# Patient Record
Sex: Male | Born: 1945 | Race: White | Hispanic: No | Marital: Single | Smoking: Former smoker
Health system: Southern US, Community
[De-identification: ages and names within clinical notes are randomized; demographics above are authoritative.]

## PROBLEM LIST (undated history)

## (undated) DIAGNOSIS — Z951 Presence of aortocoronary bypass graft: Secondary | ICD-10-CM

## (undated) DIAGNOSIS — Z9889 Other specified postprocedural states: Secondary | ICD-10-CM

## (undated) DIAGNOSIS — C679 Malignant neoplasm of bladder, unspecified: Secondary | ICD-10-CM

## (undated) DIAGNOSIS — G8929 Other chronic pain: Secondary | ICD-10-CM

## (undated) DIAGNOSIS — S3992XA Unspecified injury of lower back, initial encounter: Secondary | ICD-10-CM

## (undated) DIAGNOSIS — I739 Peripheral vascular disease, unspecified: Secondary | ICD-10-CM

## (undated) DIAGNOSIS — E785 Hyperlipidemia, unspecified: Secondary | ICD-10-CM

## (undated) DIAGNOSIS — E119 Type 2 diabetes mellitus without complications: Secondary | ICD-10-CM

## (undated) DIAGNOSIS — I10 Essential (primary) hypertension: Secondary | ICD-10-CM

## (undated) DIAGNOSIS — K219 Gastro-esophageal reflux disease without esophagitis: Secondary | ICD-10-CM

## (undated) DIAGNOSIS — Z888 Allergy status to other drugs, medicaments and biological substances status: Secondary | ICD-10-CM

## (undated) DIAGNOSIS — I480 Paroxysmal atrial fibrillation: Secondary | ICD-10-CM

## (undated) DIAGNOSIS — I2510 Atherosclerotic heart disease of native coronary artery without angina pectoris: Secondary | ICD-10-CM

## (undated) DIAGNOSIS — I255 Ischemic cardiomyopathy: Secondary | ICD-10-CM

## (undated) DIAGNOSIS — G894 Chronic pain syndrome: Secondary | ICD-10-CM

## (undated) DIAGNOSIS — Z87891 Personal history of nicotine dependence: Secondary | ICD-10-CM

## (undated) DIAGNOSIS — I779 Disorder of arteries and arterioles, unspecified: Secondary | ICD-10-CM

## (undated) DIAGNOSIS — C801 Malignant (primary) neoplasm, unspecified: Secondary | ICD-10-CM

## (undated) HISTORY — DX: Personal history of nicotine dependence: Z87.891

## (undated) HISTORY — DX: Essential (primary) hypertension: I10

## (undated) HISTORY — DX: Allergy status to other drugs, medicaments and biological substances: Z88.8

## (undated) HISTORY — DX: Atherosclerotic heart disease of native coronary artery without angina pectoris: I25.10

## (undated) HISTORY — DX: Disorder of arteries and arterioles, unspecified: I77.9

## (undated) HISTORY — DX: Hyperlipidemia, unspecified: E78.5

## (undated) HISTORY — DX: Paroxysmal atrial fibrillation: I48.0

## (undated) HISTORY — DX: Peripheral vascular disease, unspecified: I73.9

## (undated) HISTORY — DX: Gastro-esophageal reflux disease without esophagitis: K21.9

## (undated) HISTORY — DX: Type 2 diabetes mellitus without complications: E11.9

## (undated) HISTORY — PX: CARDIAC CATHETERIZATION: SHX172

## (undated) HISTORY — DX: Chronic pain syndrome: G89.4

---

## 2004-10-21 ENCOUNTER — Other Ambulatory Visit: Payer: Self-pay

## 2004-10-21 ENCOUNTER — Inpatient Hospital Stay: Payer: Self-pay | Admitting: Internal Medicine

## 2004-10-21 IMAGING — CR DG CHEST 1V PORT
1 series · 1 of 1 positions shown · non-contrast
Comparison: none

REASON FOR EXAM: Chest pain
COMMENTS:

[view not recorded]
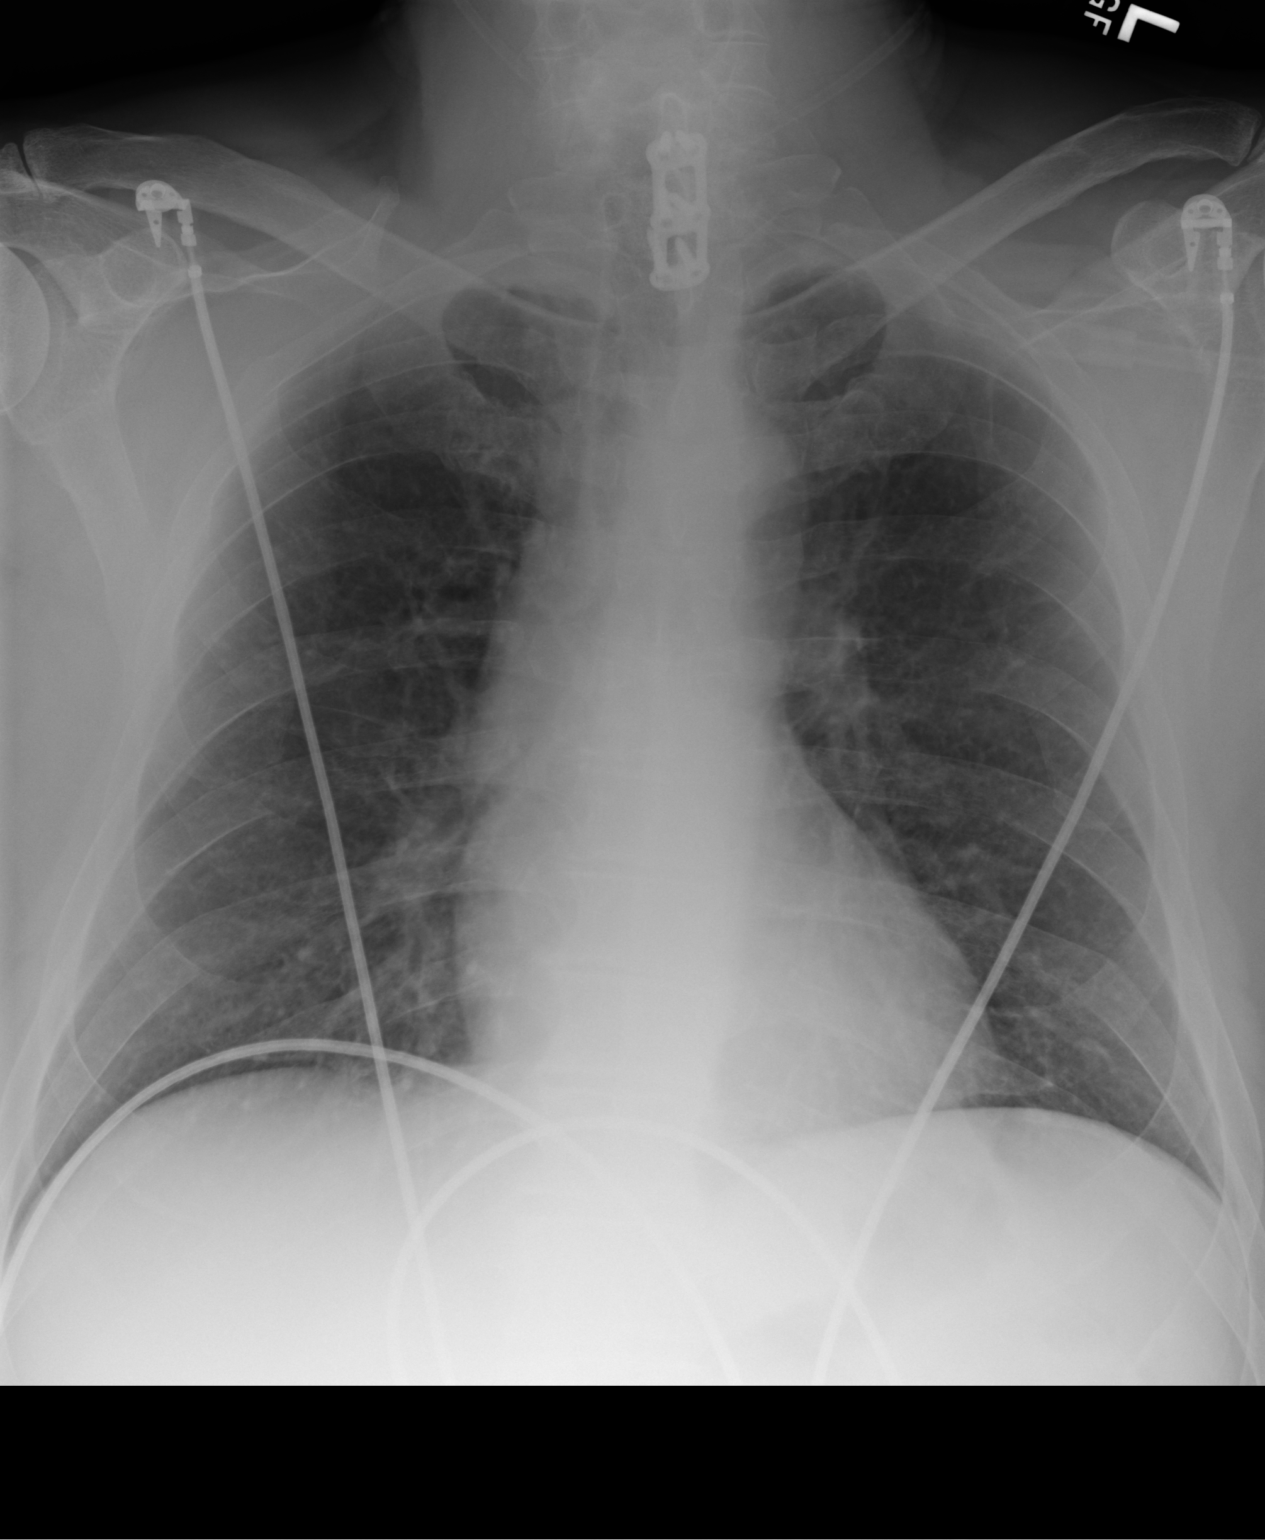

[1 of 1 positions shown; findings below may reference images not displayed]

PROCEDURE:     DXR - DXR PORTABLE CHEST SINGLE VIEW  - [DATE]  [DATE]

RESULT:     AP view of the chest is compared to a prior exam of [DATE].

The lung fields are clear. No pneumonia, pneumothorax or pleural effusion is
seen. The heart size is normal. The mediastinum and osseous structures show
no acute changes.
IMPRESSION: 1.     No acute changes are identified.
2.     Monitoring electrodes are present.
3.     Post-operative changes of prior cervical fusion are noted.

## 2004-10-22 ENCOUNTER — Other Ambulatory Visit: Payer: Self-pay

## 2004-10-23 ENCOUNTER — Other Ambulatory Visit: Payer: Self-pay

## 2004-10-23 IMAGING — CT CT ABD-PELV W/ CM
1 of 2 series · 14 of 32 positions shown, 18 images · non-contrast
Comparison: none

REASON FOR EXAM: Rule out mass
COMMENTS:

[Series 2: abdomen · axial · 0.67mm/px · z∈[-630,-206]mm · 14 of 61 slices shown, 18 images]
[im 5/61  soft-tissue]
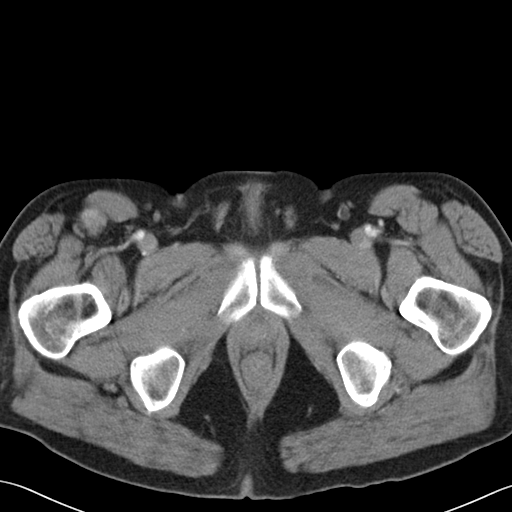
[im 5/61  bone]
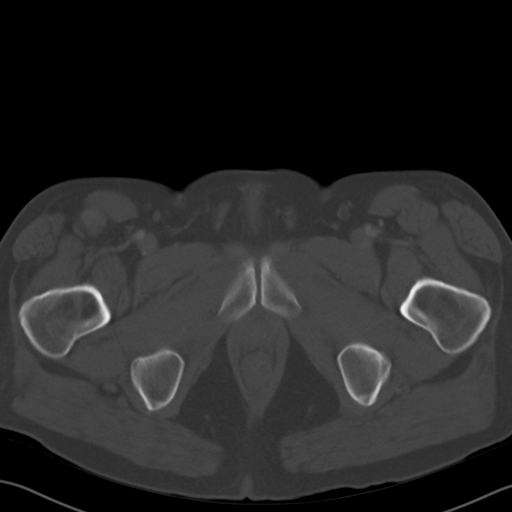
[im 10/61  soft-tissue]
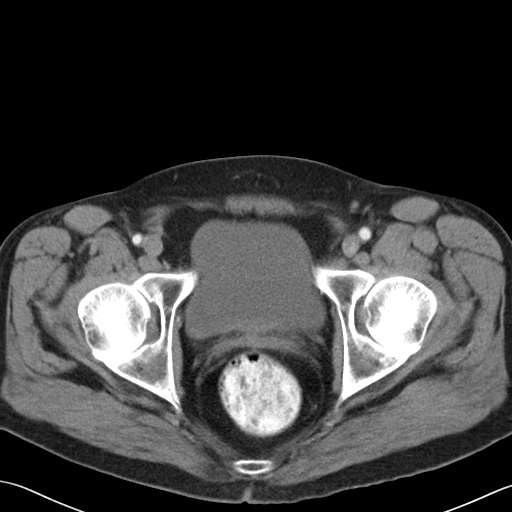
[im 14/61  soft-tissue]
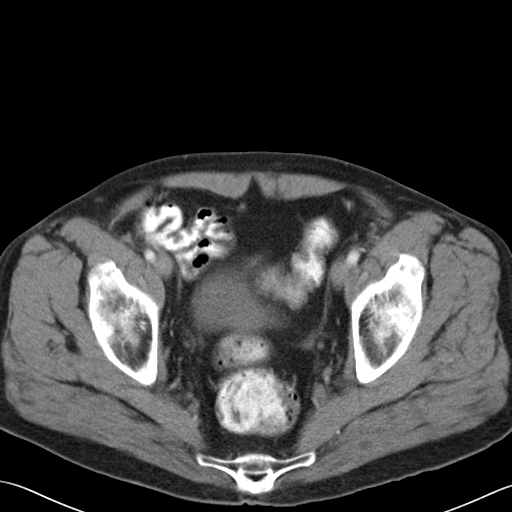
[im 19/61  soft-tissue]
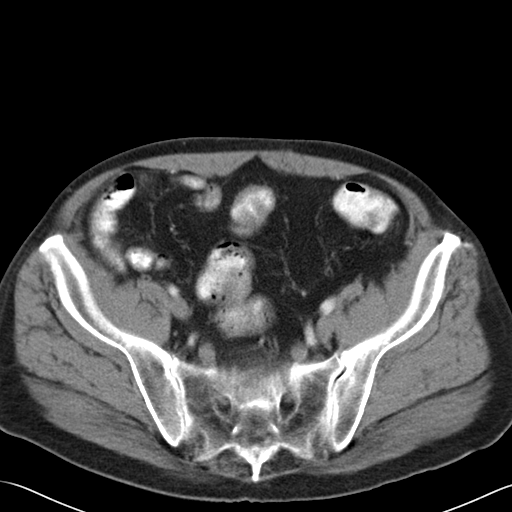
[im 24/61  soft-tissue]
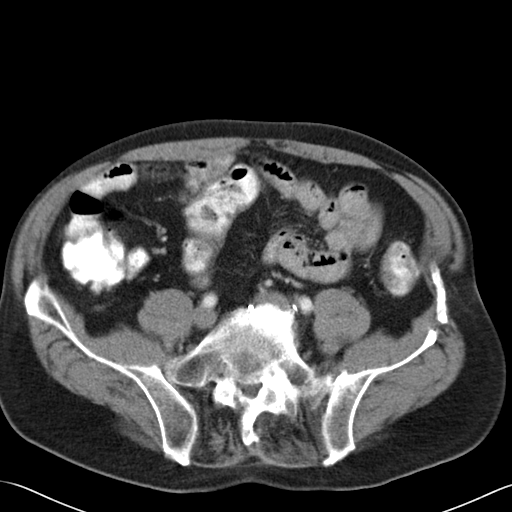
[im 28/61  soft-tissue]
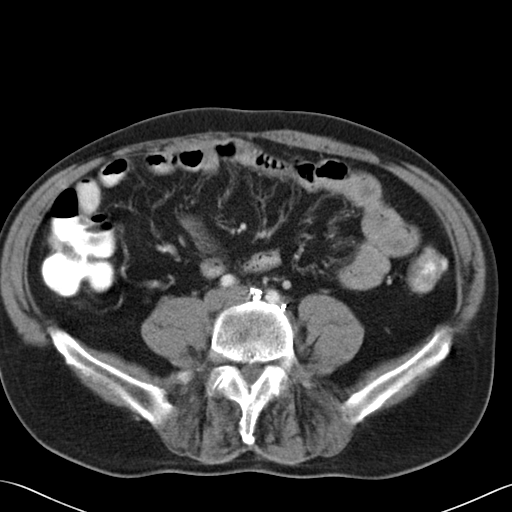
[im 33/61  soft-tissue]
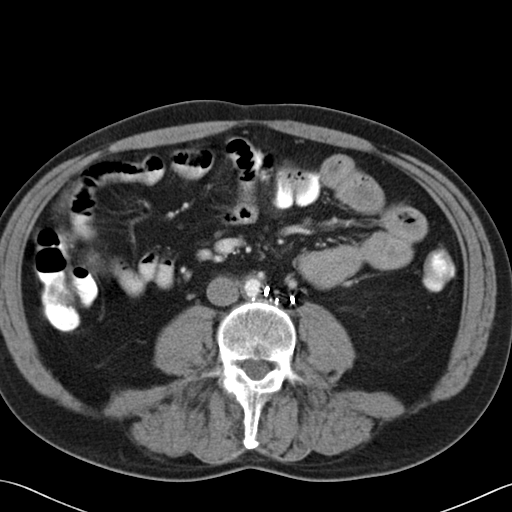
[im 37/61  soft-tissue]
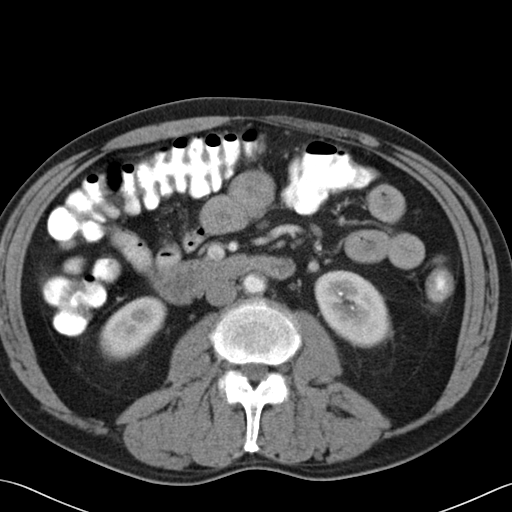
[im 42/61  soft-tissue]
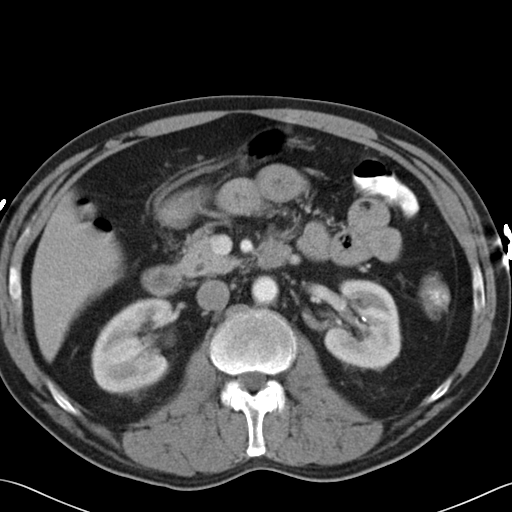
[im 42/61  bone]
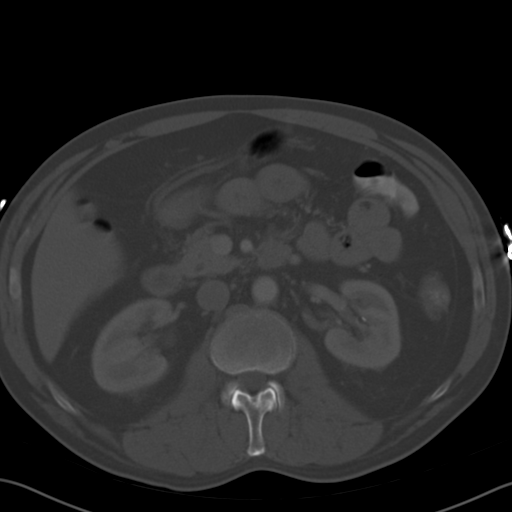
[im 47/61  soft-tissue]
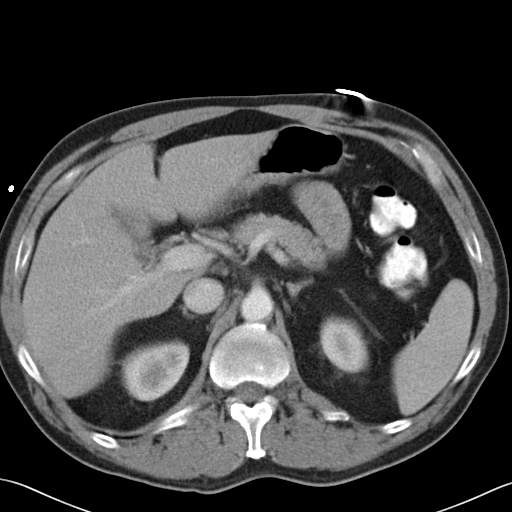
[im 51/61  soft-tissue]
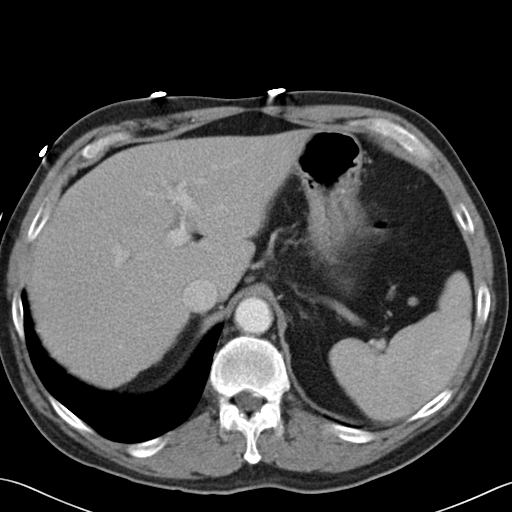
[im 51/61  lung]
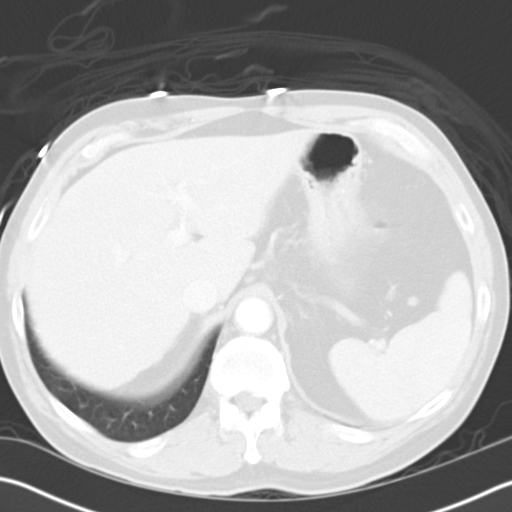
[im 54/61  lung]
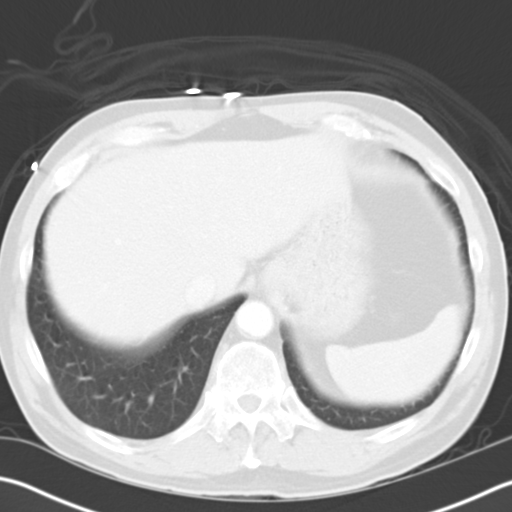
[im 56/61  soft-tissue]
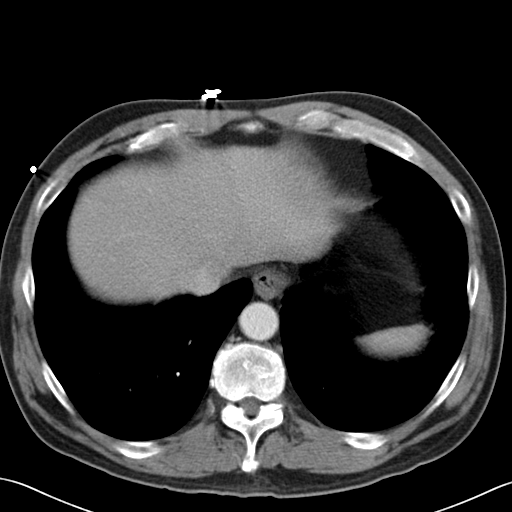
[im 56/61  lung]
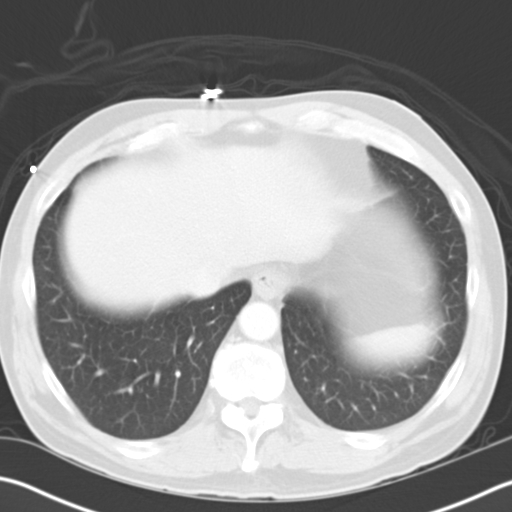
[im 58/61  lung]
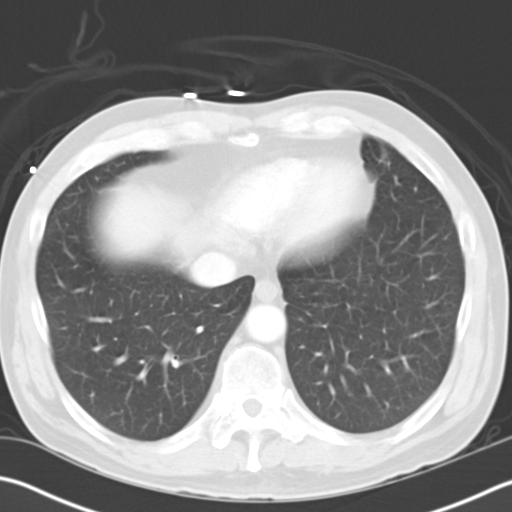

[14 of 32 positions shown; findings below may reference images not displayed]

PROCEDURE:     CT  - CT ABDOMEN / PELVIS  W  - [DATE]  [DATE]

RESULT:     The patient has abdominal discomfort.  The patient underwent
cardiac catheterization on [DATE].

The patient received 100 cc of [3M].  The liver exhibits normal
density with no mass or ductal dilation.  The gallbladder, pancreas, spleen,
and nondistended stomach are normal in appearance.  The loops of small bowel
exhibit no acute abnormality.  Only a small amount of contrast is present
within them.  Loops of large bowel are partially contrast filled and are
grossly normal in appearance.  There is a structure seen consistent with a
normal appendix.  I do not see evidence of an inguinal or pelvic or
abdominal hemorrhage.  The retroperitoneum is normal in appearance.  There
are numerous surgical clips present adjacent to the anterior aspect of the
L3 and 4 vertebral bodies.  The caliber of the abdominal aorta is within the
limits of normal.  The kidneys exhibit no hydronephrosis, but there are
dense calcifications associated with the mid and lower pole calices on the
LEFT and with a lower pole calix on the RIGHT.  This calcification on the
RIGHT is large measuring nearly 8 mm in greatest dimension.  The perinephric
fat is normal in appearance.  The caliber of the abdominal aorta is normal.
The lung windows reveal no abnormalities at the lung bases.
IMPRESSION: I do not see findings to suggest a retroperitoneal or other intraabdominal
hemorrhage.  The soft tissues of the groin exhibit no hematoma.  Of note is
the fact that on image 55 and image 58, there are calcifications that appear
to be associated with muscle groups along the extensor aspect of the upper
thigh.  This may reflect the sequelae of prior trauma.

I do not see evidence of acute hepatobiliary disease or acute renal
abnormality.  There are kidney stones bilaterally.

I do not see findings to suggest inflammatory processes involving the bowel.

There are numerous surgical clips present in the retroperitoneum adjacent to
and to the LEFT of the lower abdominal aorta.  There does appear to be some
degenerative change of the lumbar spine.

## 2005-03-28 ENCOUNTER — Other Ambulatory Visit: Payer: Self-pay

## 2005-03-28 ENCOUNTER — Inpatient Hospital Stay: Payer: Self-pay | Admitting: Internal Medicine

## 2005-03-28 IMAGING — CR DG CHEST 1V PORT
1 series · 1 of 1 positions shown · non-contrast
Comparison: none

REASON FOR EXAM: Chest pain
COMMENTS:

PROCEDURE:     DXR - DXR PORTABLE CHEST SINGLE VIEW  - [DATE]  [DATE]
RESULT:          Comparison is made to a study [DATE].
The lungs are well expanded.  There is no focal infiltrate.  The heart is
not enlarged.  The patient has undergone prior CABG.

[view not recorded]
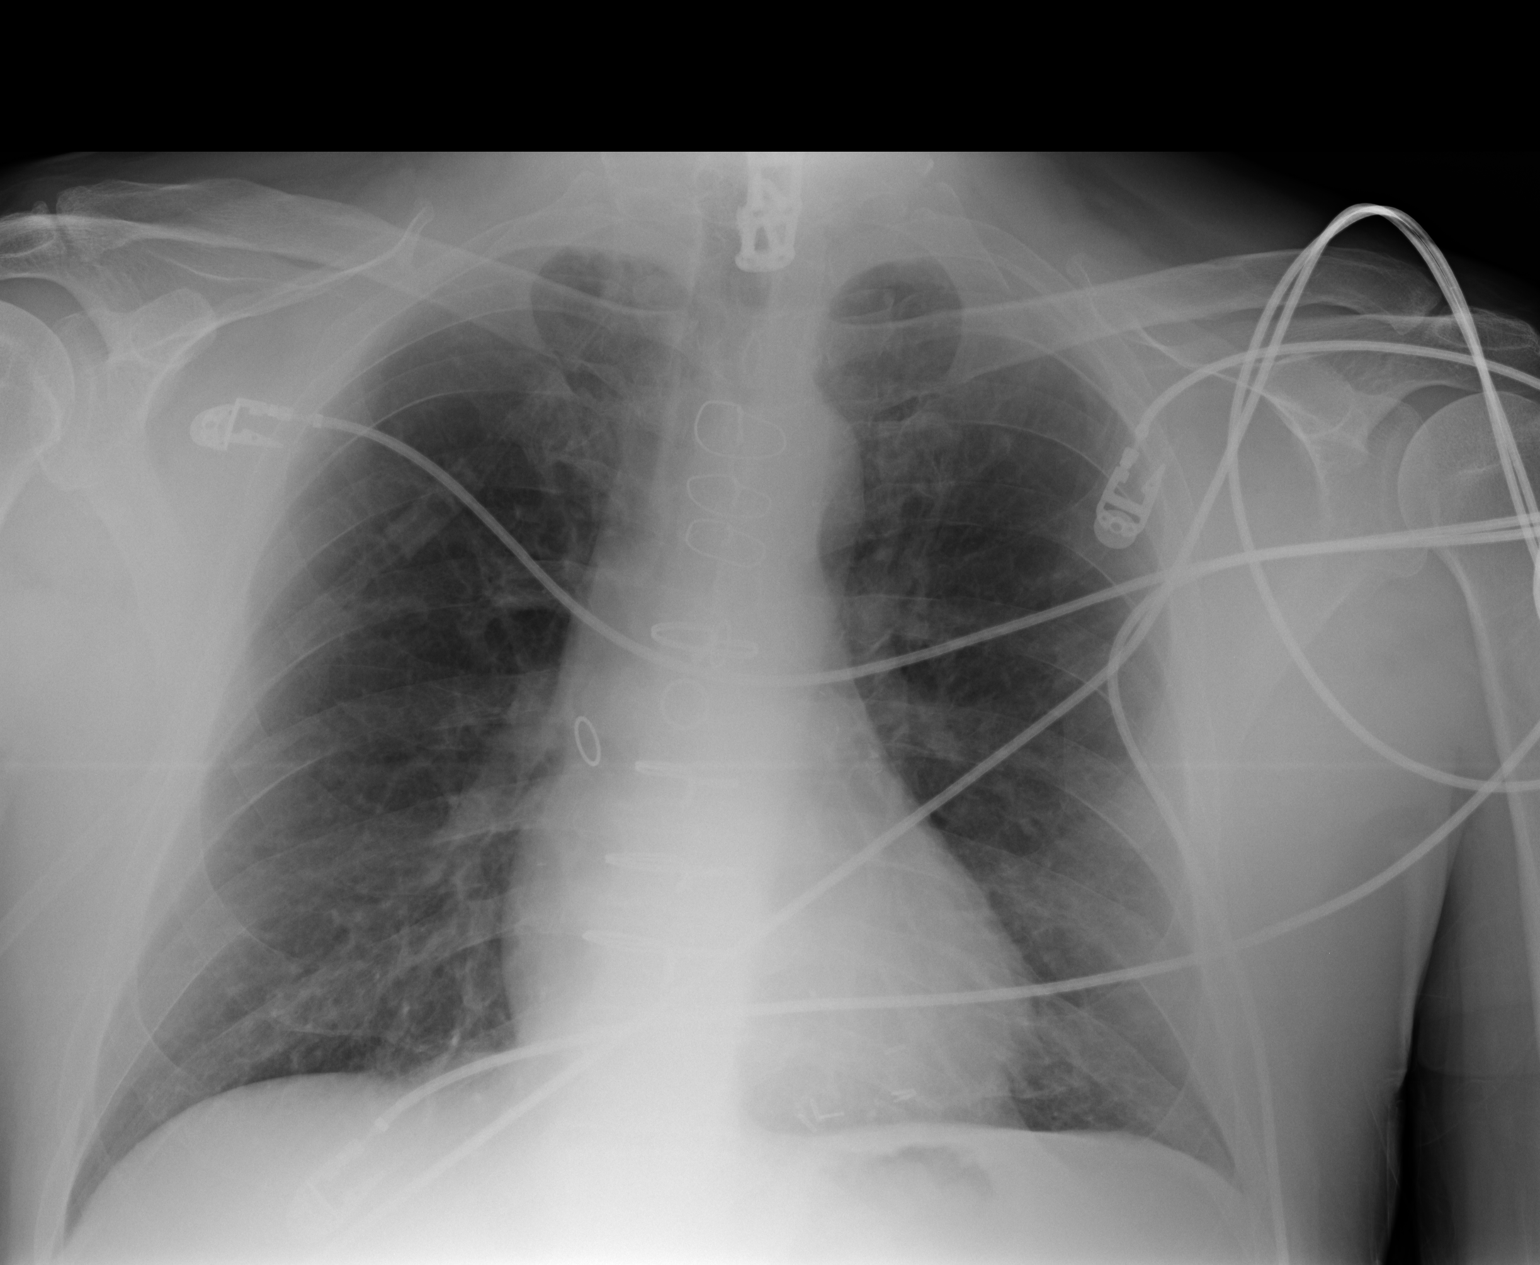

[1 of 1 positions shown; findings below may reference images not displayed]

IMPRESSION: I do not see evidence of pneumonia.  There is very mild
hyperinflation which appears stable but which may reflect an element of
underlying reactive airway disease.  The patient has undergone prior CABG,
but I see no evidence of congestive heart failure.  A prior lower cervical
fusion device is present.

## 2005-04-30 ENCOUNTER — Other Ambulatory Visit: Payer: Self-pay

## 2005-04-30 ENCOUNTER — Emergency Department: Payer: Self-pay | Admitting: Emergency Medicine

## 2005-04-30 IMAGING — CR DG CHEST 1V PORT
1 series · 1 of 1 positions shown · non-contrast
Comparison: none

REASON FOR EXAM: Chest pain
COMMENTS:

[view not recorded]
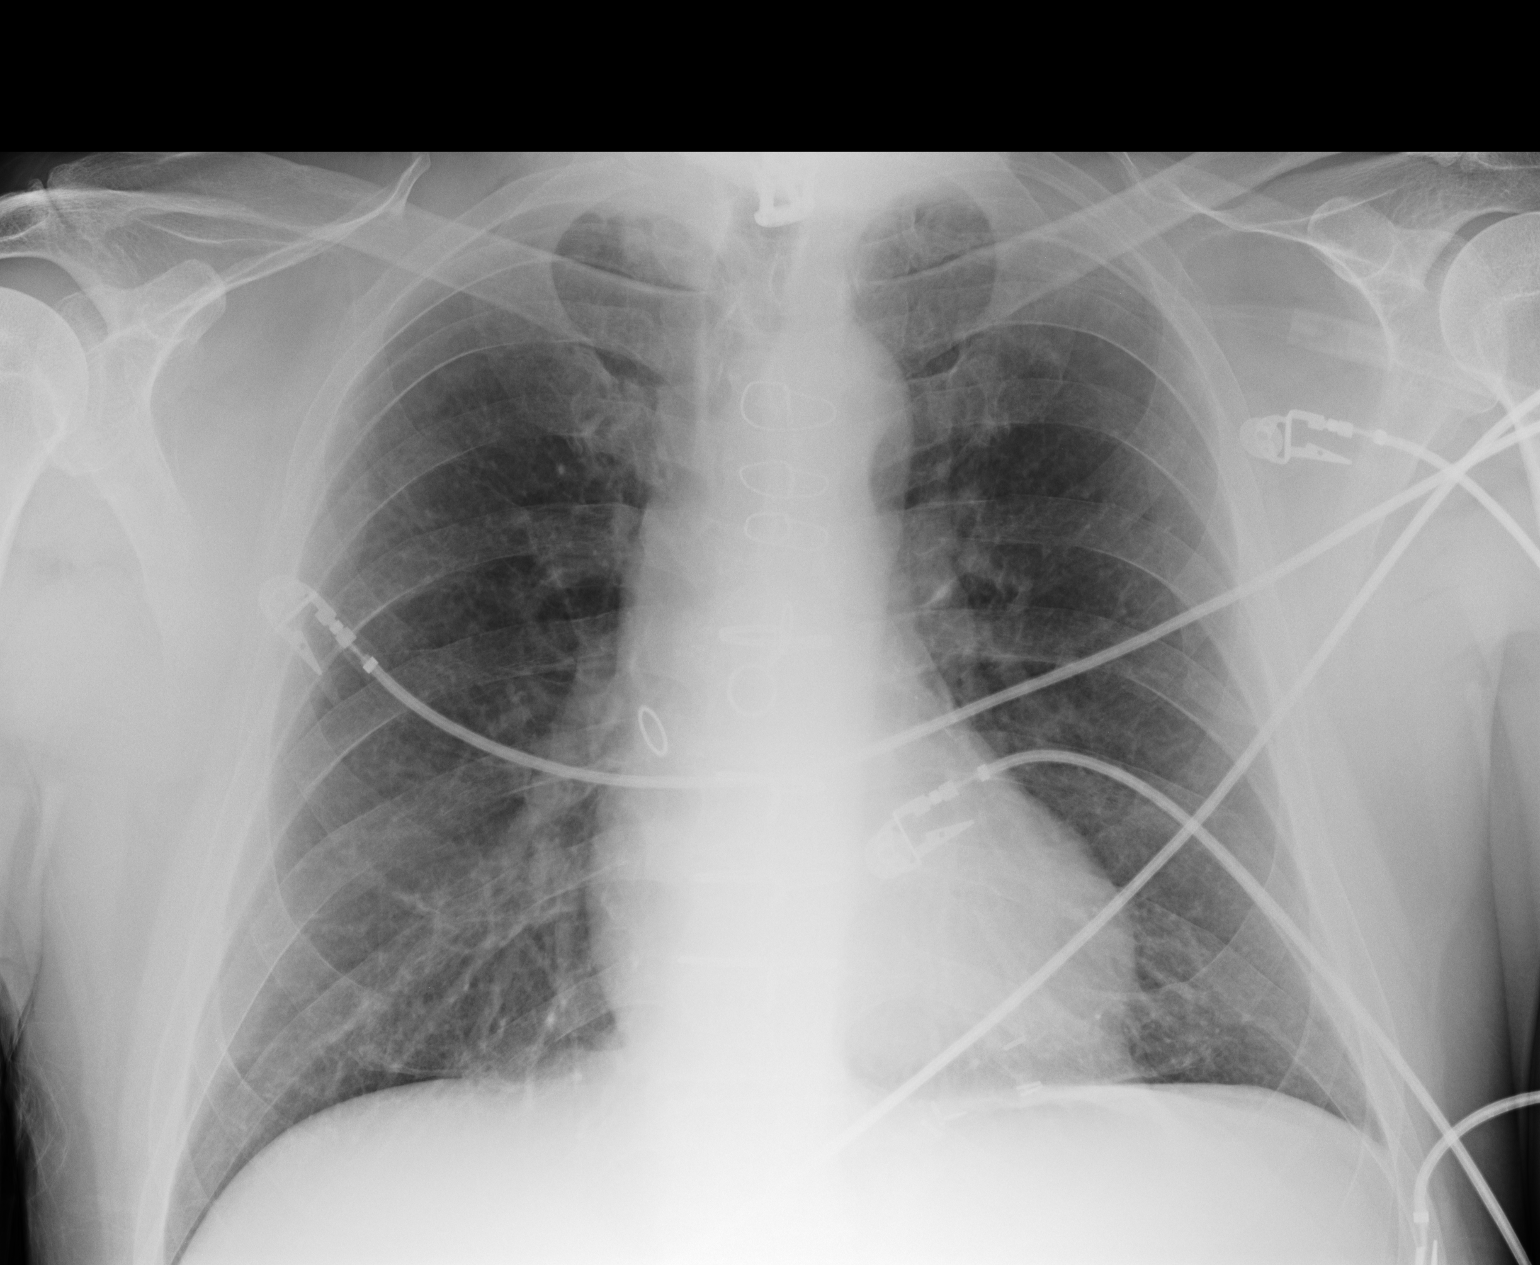

[1 of 1 positions shown; findings below may reference images not displayed]

PROCEDURE:     DXR - DXR PORTABLE CHEST SINGLE VIEW  - [DATE]  [DATE]

RESULT:        AP view of the chest is compared to the prior exam of
[DATE].  The lung fields remain clear.  The heart and pulmonary
vasculature show no significant abnormalities.  Postoperative changes of
prior cardiac surgery are seen.  There is a metallic plate projected over
the lower cervical area compatible with prior cervical surgery. Monitoring
electrodes are present.
IMPRESSION: No acute changes are identified.

## 2005-06-16 HISTORY — PX: CORONARY ARTERY BYPASS GRAFT: SHX141

## 2005-08-17 ENCOUNTER — Other Ambulatory Visit: Payer: Self-pay

## 2005-08-17 ENCOUNTER — Inpatient Hospital Stay: Payer: Self-pay | Admitting: Internal Medicine

## 2005-08-17 IMAGING — CR DG CHEST 1V PORT
1 series · 1 of 1 positions shown · non-contrast
Comparison: none

REASON FOR EXAM: Chest pain         rm 19
COMMENTS:

[view not recorded]
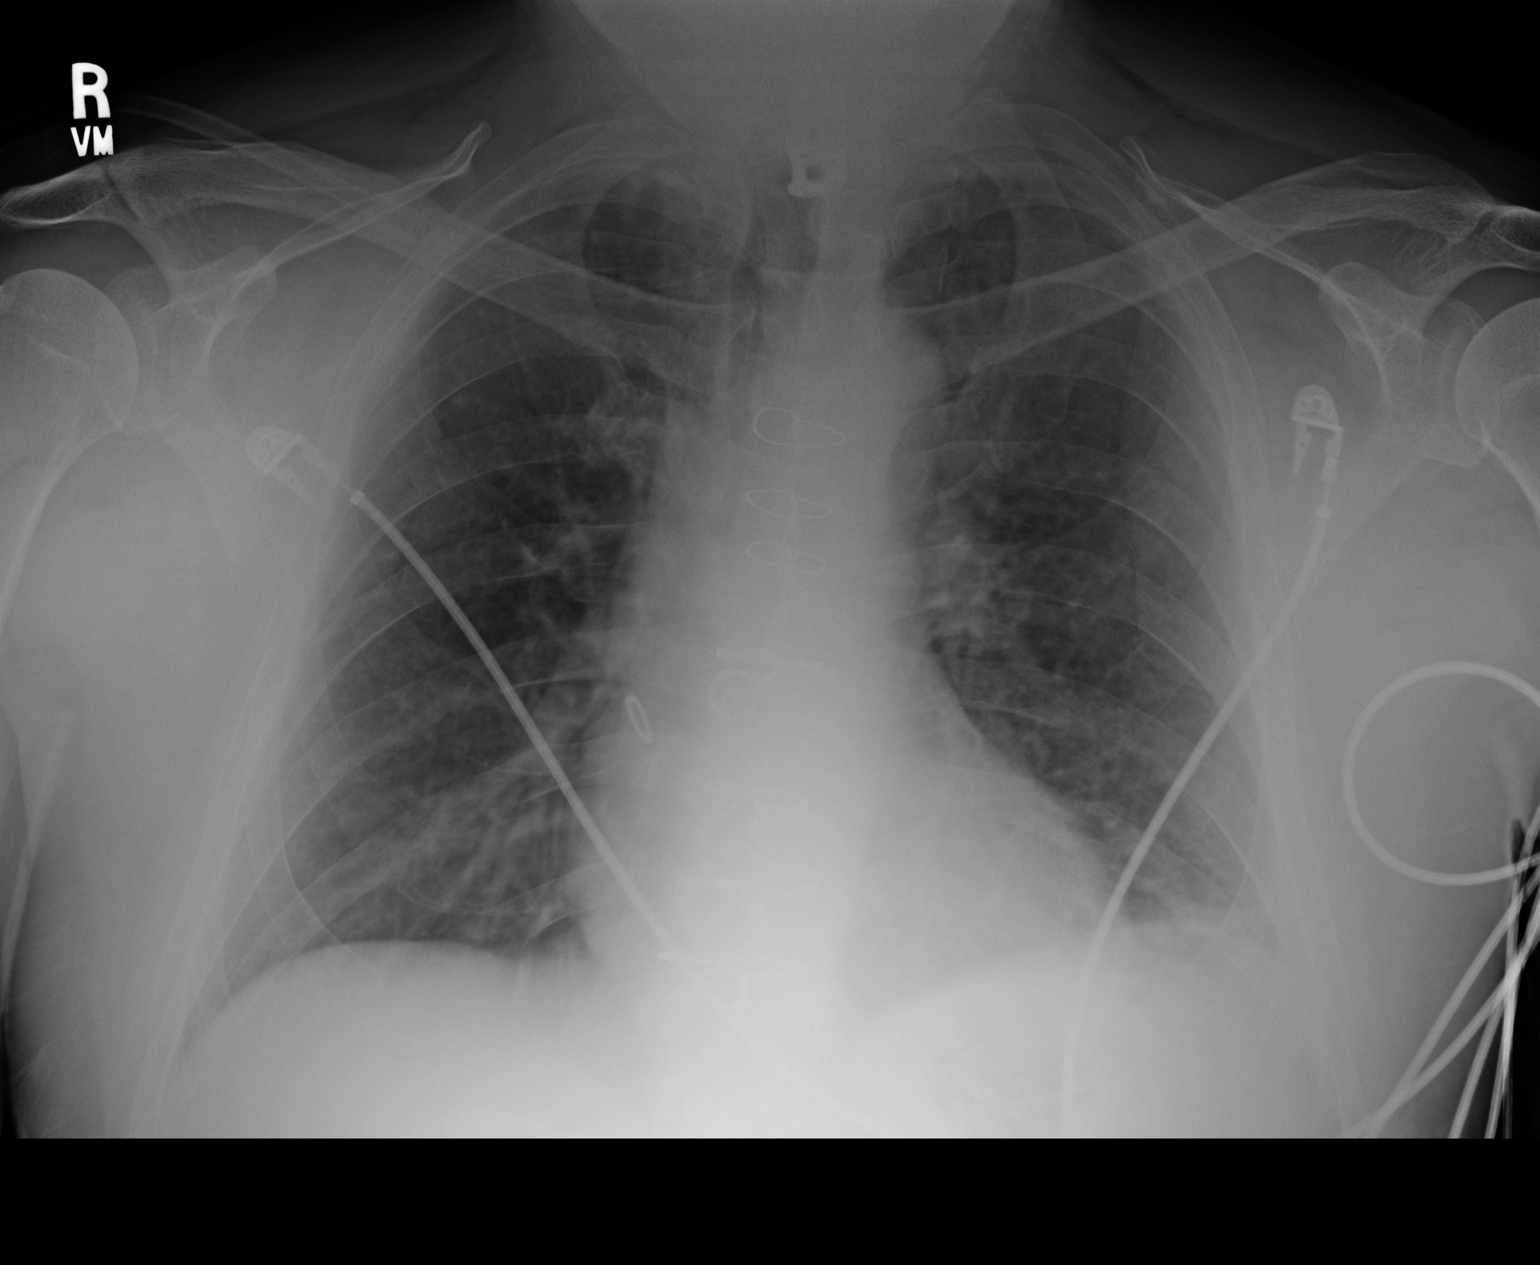

[1 of 1 positions shown; findings below may reference images not displayed]

PROCEDURE:     DXR - DXR PORTABLE CHEST SINGLE VIEW  - [DATE] [DATE]

RESULT:       AP view of the chest is compared to the prior exam of
[DATE].  There is slight atelectasis at the LEFT base.  The lung fields
otherwise are clear.  No pneumonia, pneumothorax or pleural effusion is
seen.   The heart size is normal.  Postoperative changes of prior CABG are
noted.  Monitoring electrodes are present.
IMPRESSION: 1.     There is slight atelectasis at the LEFT lung base.  The lung fields
otherwise are clear.
2.     The heart size is normal.

## 2005-08-18 ENCOUNTER — Other Ambulatory Visit: Payer: Self-pay

## 2007-08-07 ENCOUNTER — Inpatient Hospital Stay: Payer: Self-pay | Admitting: Internal Medicine

## 2007-08-07 ENCOUNTER — Other Ambulatory Visit: Payer: Self-pay

## 2007-08-07 IMAGING — CT CT ABD-PELV W/ CM
1 of 3 series · 14 of 32 positions shown, 19 images · non-contrast
Comparison: Prior CTs from [DATE].

REASON FOR EXAM: (1) Abd pain,N/V,Diarhea; (2) Abd pain,N/V diarhea
COMMENTS:

PROCEDURE:     CT  - CT ABDOMEN / PELVIS  W  - [DATE]  [DATE]
RESULT:
HISTORY: Abdominal pain.

[Series 2: abdomen · axial · 0.79mm/px · z∈[-510,-46]mm · 14 of 66 slices shown, 19 images]
[im 4/66  soft-tissue]
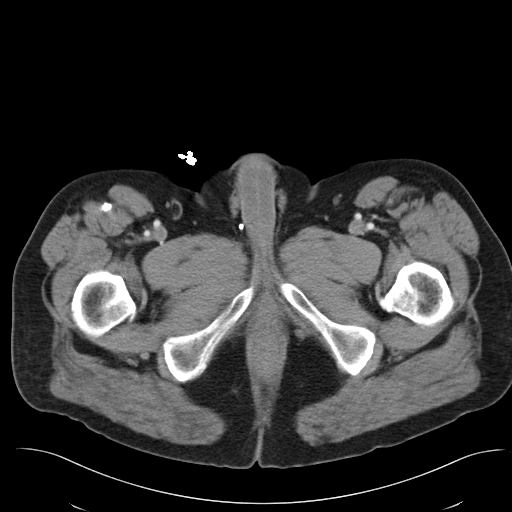
[im 4/66  bone]
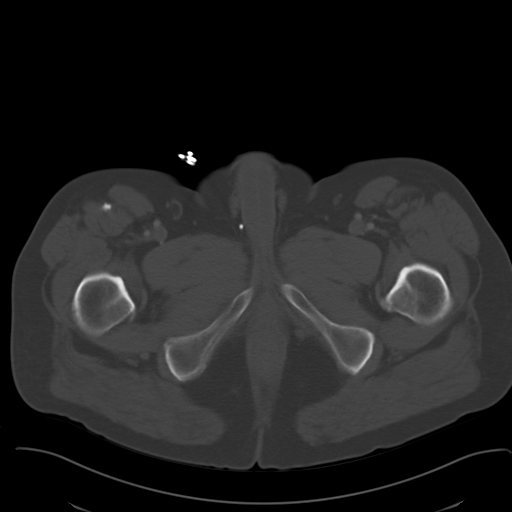
[im 11/66  soft-tissue]
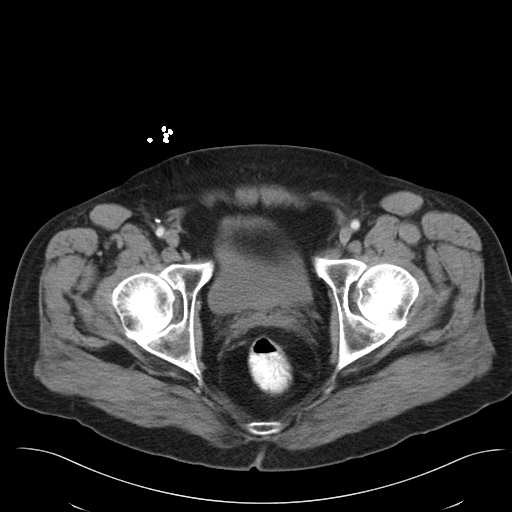
[im 14/66  soft-tissue]
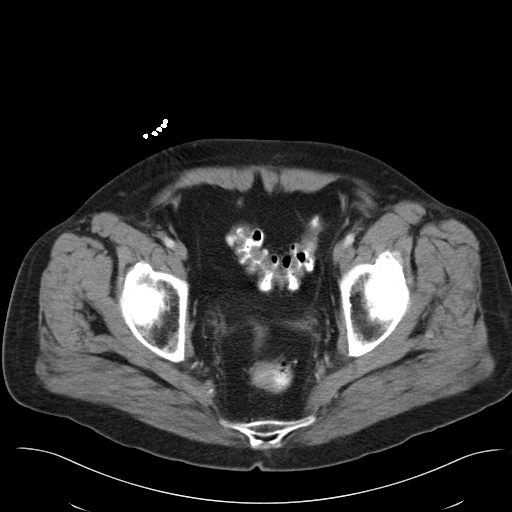
[im 18/66  soft-tissue]
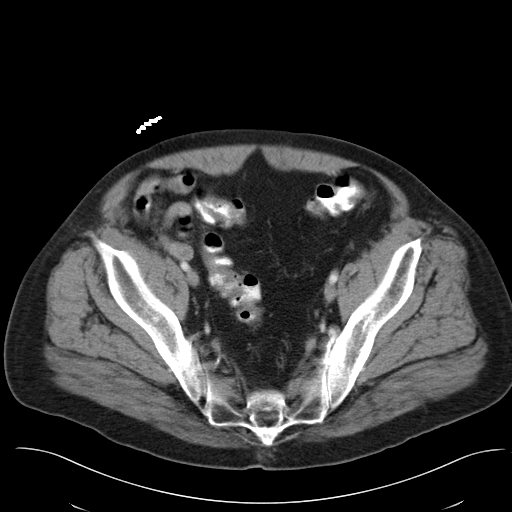
[im 24/66  soft-tissue]
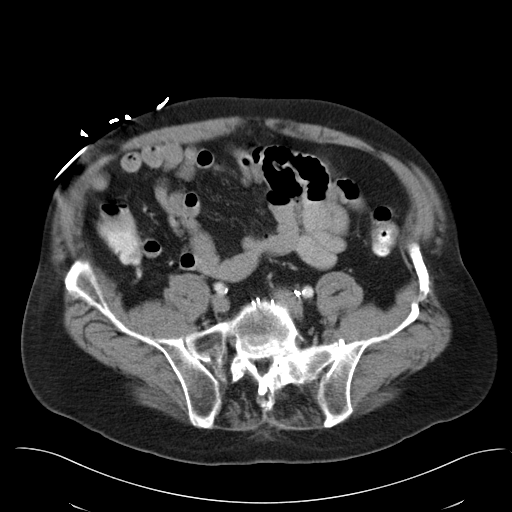
[im 28/66  soft-tissue]
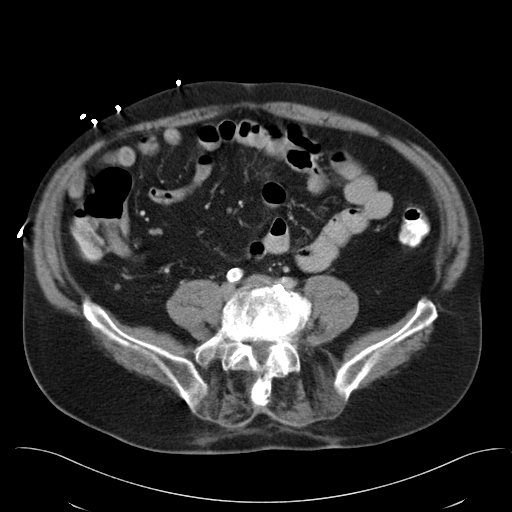
[im 35/66  soft-tissue]
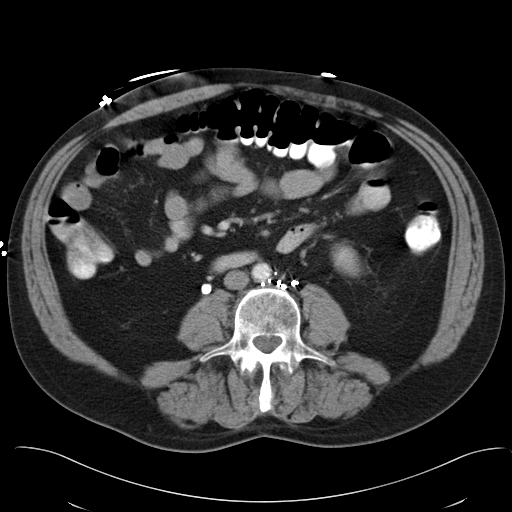
[im 38/66  soft-tissue]
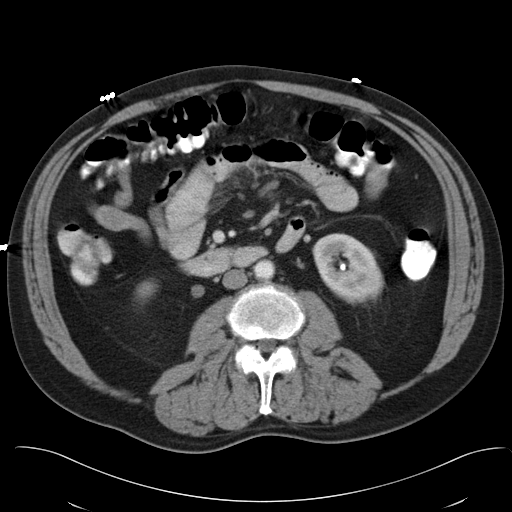
[im 42/66  soft-tissue]
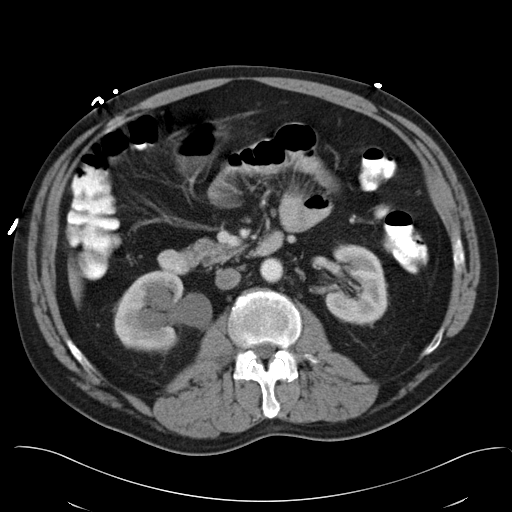
[im 42/66  bone]
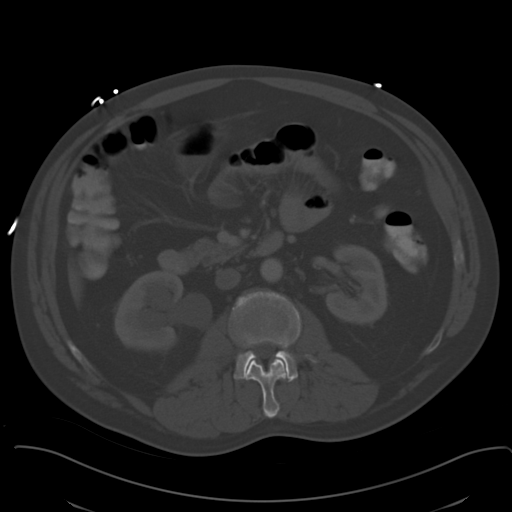
[im 48/66  soft-tissue]
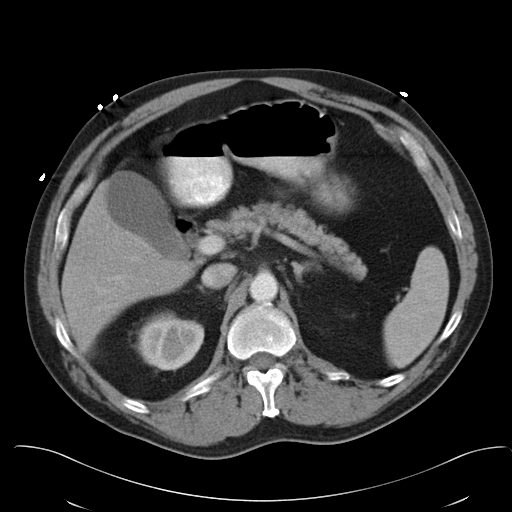
[im 52/66  soft-tissue]
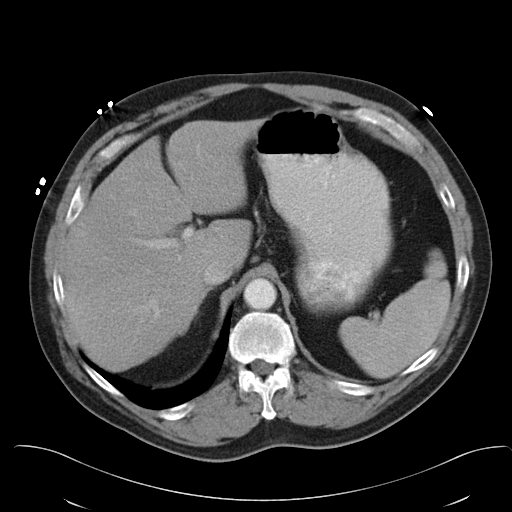
[im 52/66  lung]
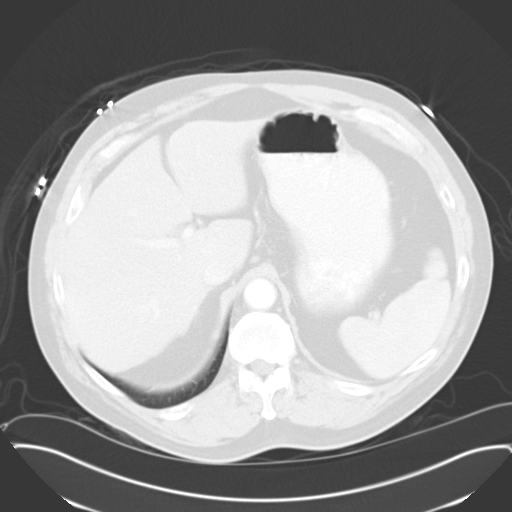
[im 55/66  soft-tissue]
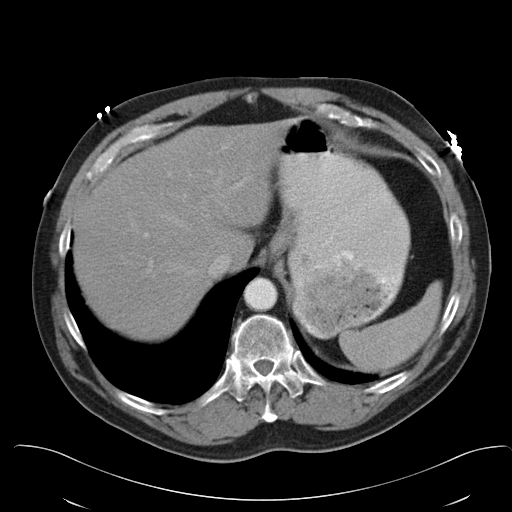
[im 55/66  lung]
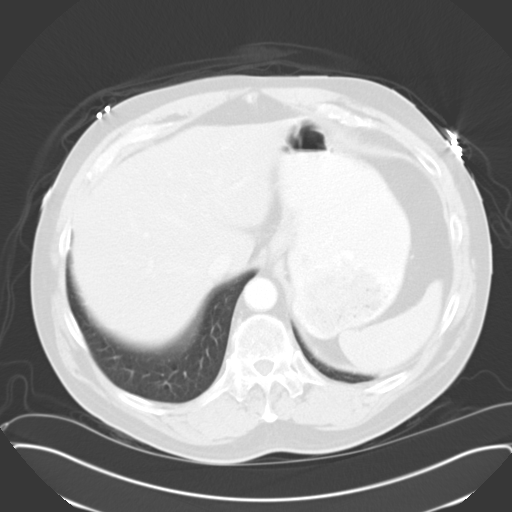
[im 59/66  lung]
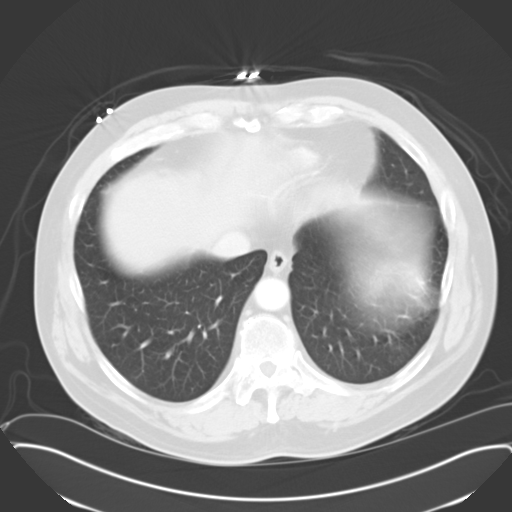
[im 62/66  soft-tissue]
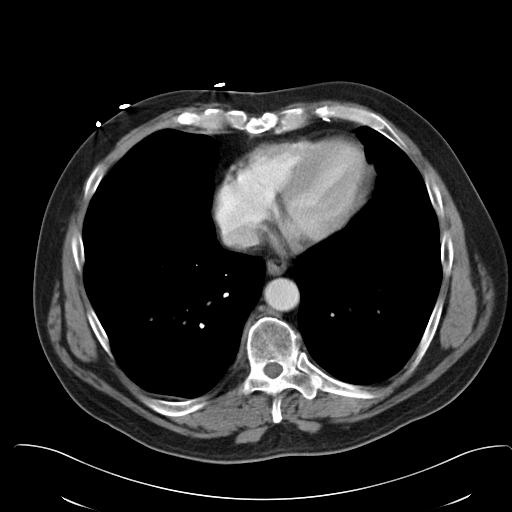
[im 62/66  lung]
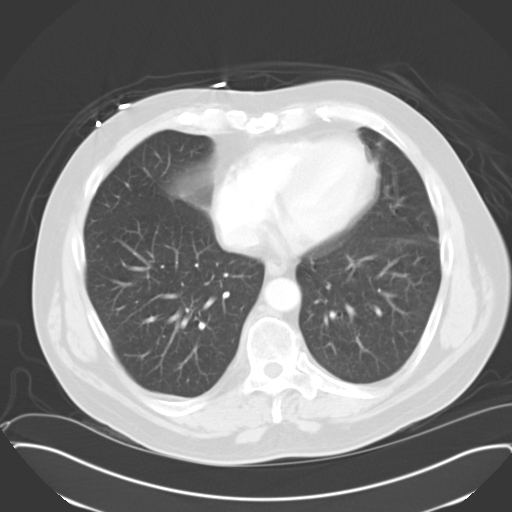

[14 of 32 positions shown; findings below may reference images not displayed]

IV and oral contrast enhanced CT of the abdomen and pelvis obtained.  The
liver and spleen are normal.  Pancreas is normal.  Gallbladder is not
distended. The adrenals are normal. Left nephrolithiasis noted. There is an
approximately 8  millimeter stone the proximal right ureter with right
hydronephrosis. There is no bowel distention. Right upper quadrant is
unremarkable. Pelvis is unremarkable. Lung bases are clear.
IMPRESSION: 1.     8 mm stone in the proximal RIGHT ureter with RIGHT hydronephrosis.
2.     It should be noted that retroperitoneal clips are present.  NO
evidence of retroperitoneal adenopathy or masses are noted.

## 2007-08-07 IMAGING — CR DG CHEST 1V PORT
1 series · 1 of 1 positions shown · non-contrast
Comparison: none

REASON FOR EXAM: Chest Pain
COMMENTS:

[view not recorded]
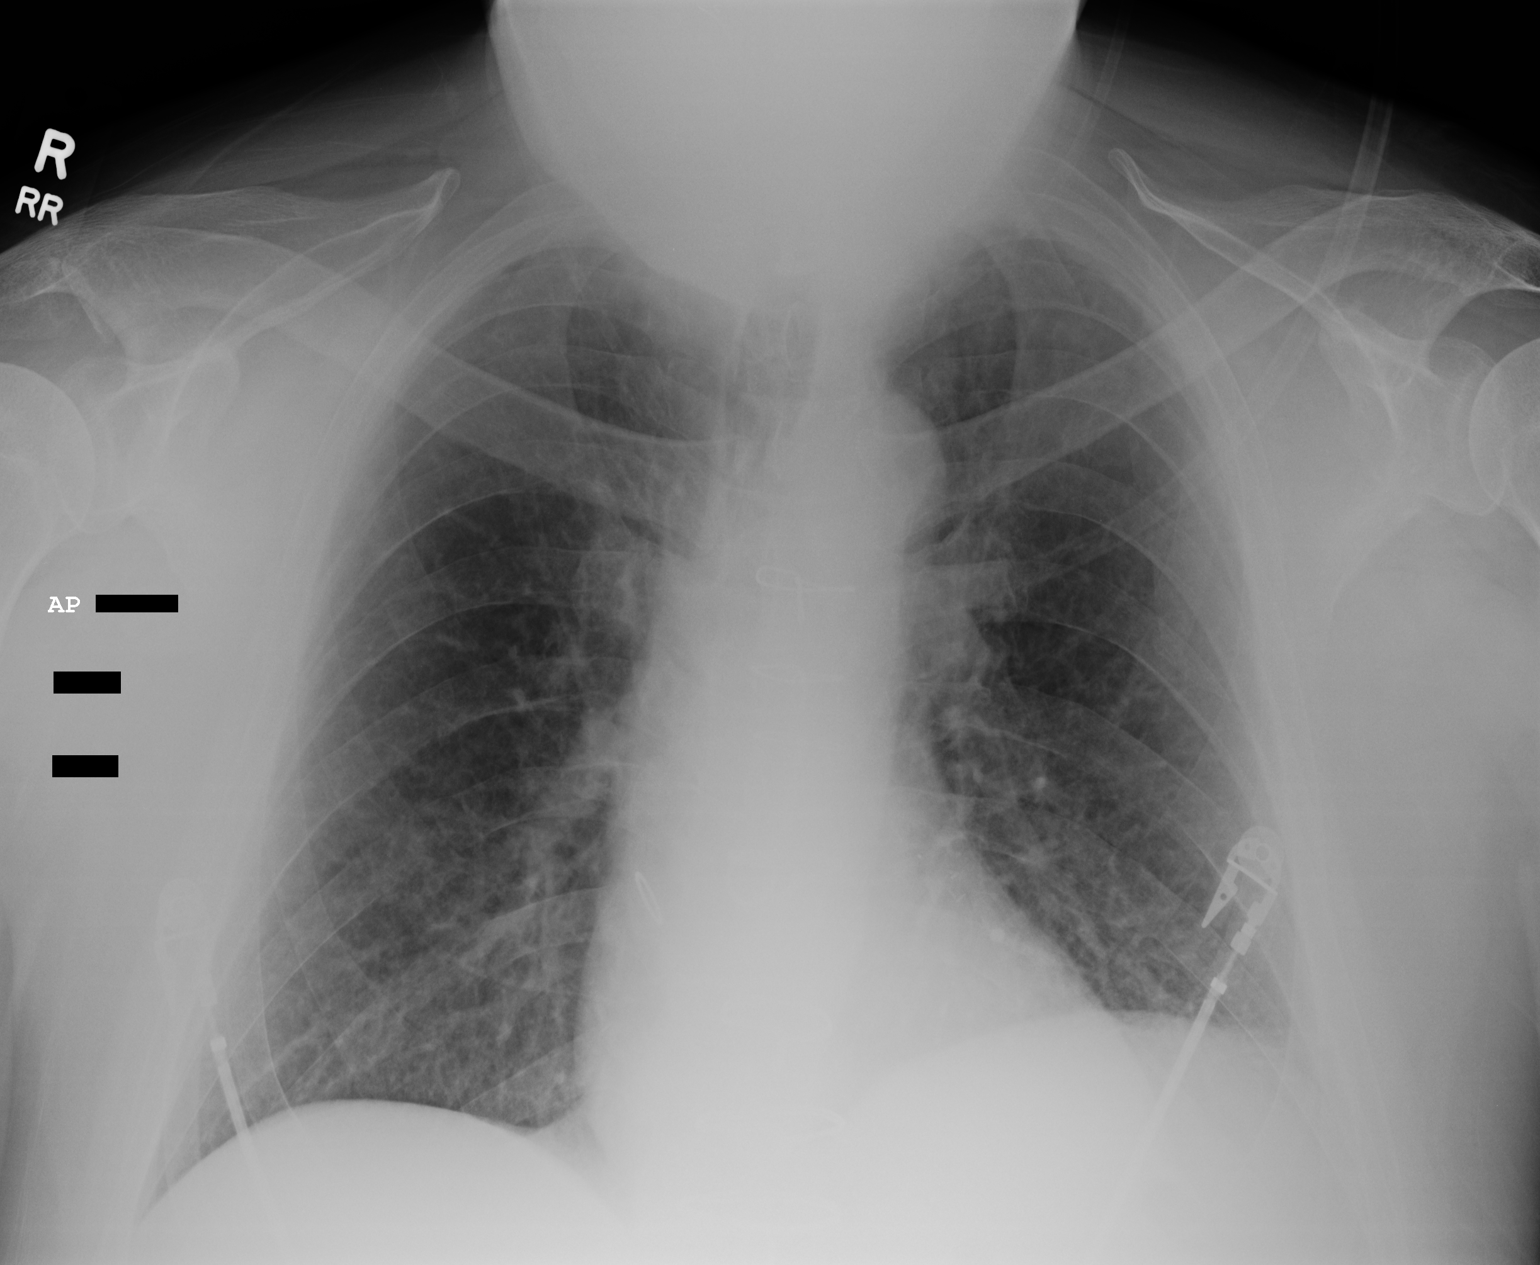

[1 of 1 positions shown; findings below may reference images not displayed]

PROCEDURE:     DXR - DXR PORTABLE CHEST SINGLE VIEW  - [DATE]  [DATE]

RESULT:     No acute cardiopulmonary disease noted.  Cardiovascular
structures are unremarkable.  Chest is stable from [DATE] with mild
elevation of the left hemidiaphragm. Patient has had a prior median
sternotomy. There is no cardiomegaly.
IMPRESSION: 1.     Patient has had a prior median sternotomy.
2.     No acute cardiopulmonary disease.  Chest is stable from [DATE].

## 2008-02-29 ENCOUNTER — Other Ambulatory Visit: Payer: Self-pay

## 2008-02-29 ENCOUNTER — Inpatient Hospital Stay: Payer: Self-pay | Admitting: Specialist

## 2008-02-29 IMAGING — CR PELVIS - 1-2 VIEW
1 series · 1 of 1 positions shown · non-contrast
Comparison: none

REASON FOR EXAM: hip pain s/p fall
COMMENTS:   LMP: (Male)

PROCEDURE:     DXR - DXR PELVIS AP ONLY  - [DATE]  [DATE]
RESULT:     Findings:
Single view of the pelvis demonstrates a comminuted fracture of the left
acetabulum. There is no hip dislocation. There is surgical changes involving
the left ilium.

[view not recorded]
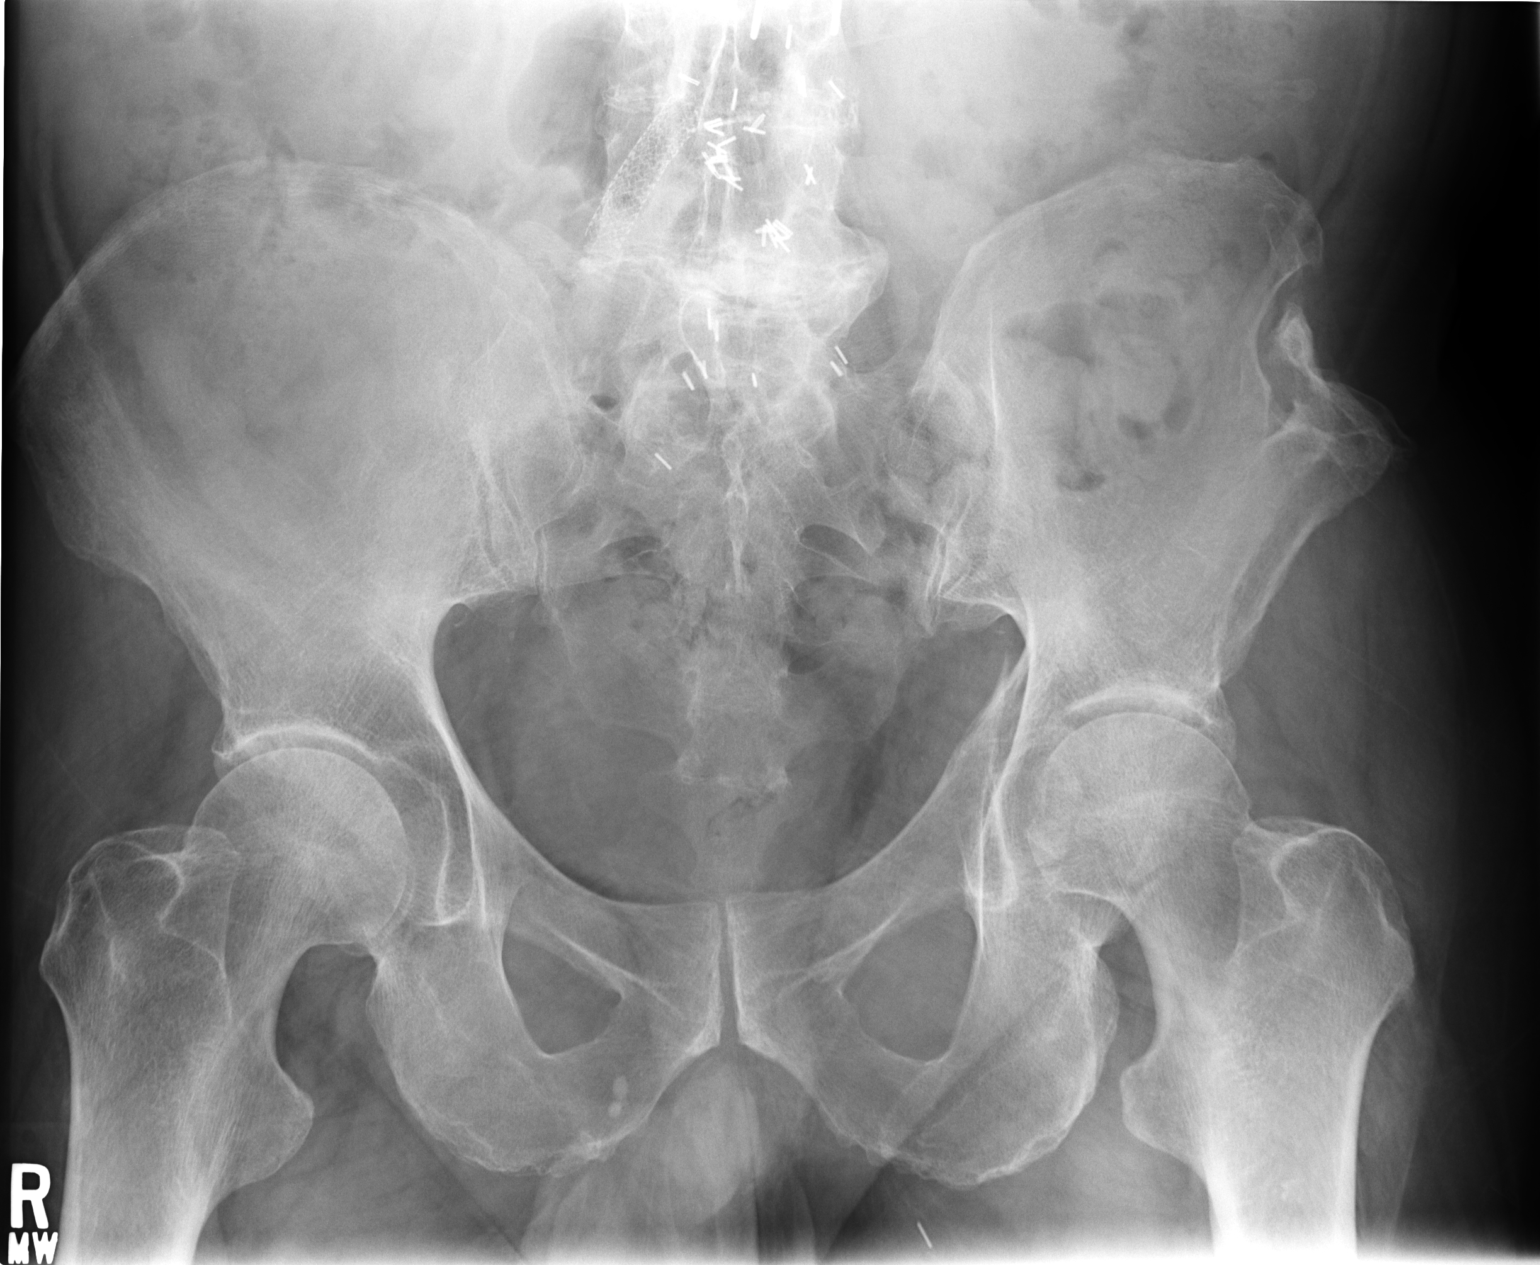

[1 of 1 positions shown; findings below may reference images not displayed]

IMPRESSION: As above.

## 2008-02-29 IMAGING — CR DG HIP COMPLETE 2+V*L*
1 series · 2 of 2 positions shown · non-contrast
Comparison: none

REASON FOR EXAM: hip pain s/p trauma
COMMENTS:   LMP: (Male)

PROCEDURE:     DXR - DXR HIP LEFT COMPLETE  - [DATE]  [DATE]
RESULT:     Findings:
2 views of the left hip demonstrates no proximal left femoral fracture or
dislocation. There is a comminuted fracture of the left acetabulum.

[Series 1: view not recorded · 0.17mm/px · 2 of 2 slices shown]
[im 1/2]
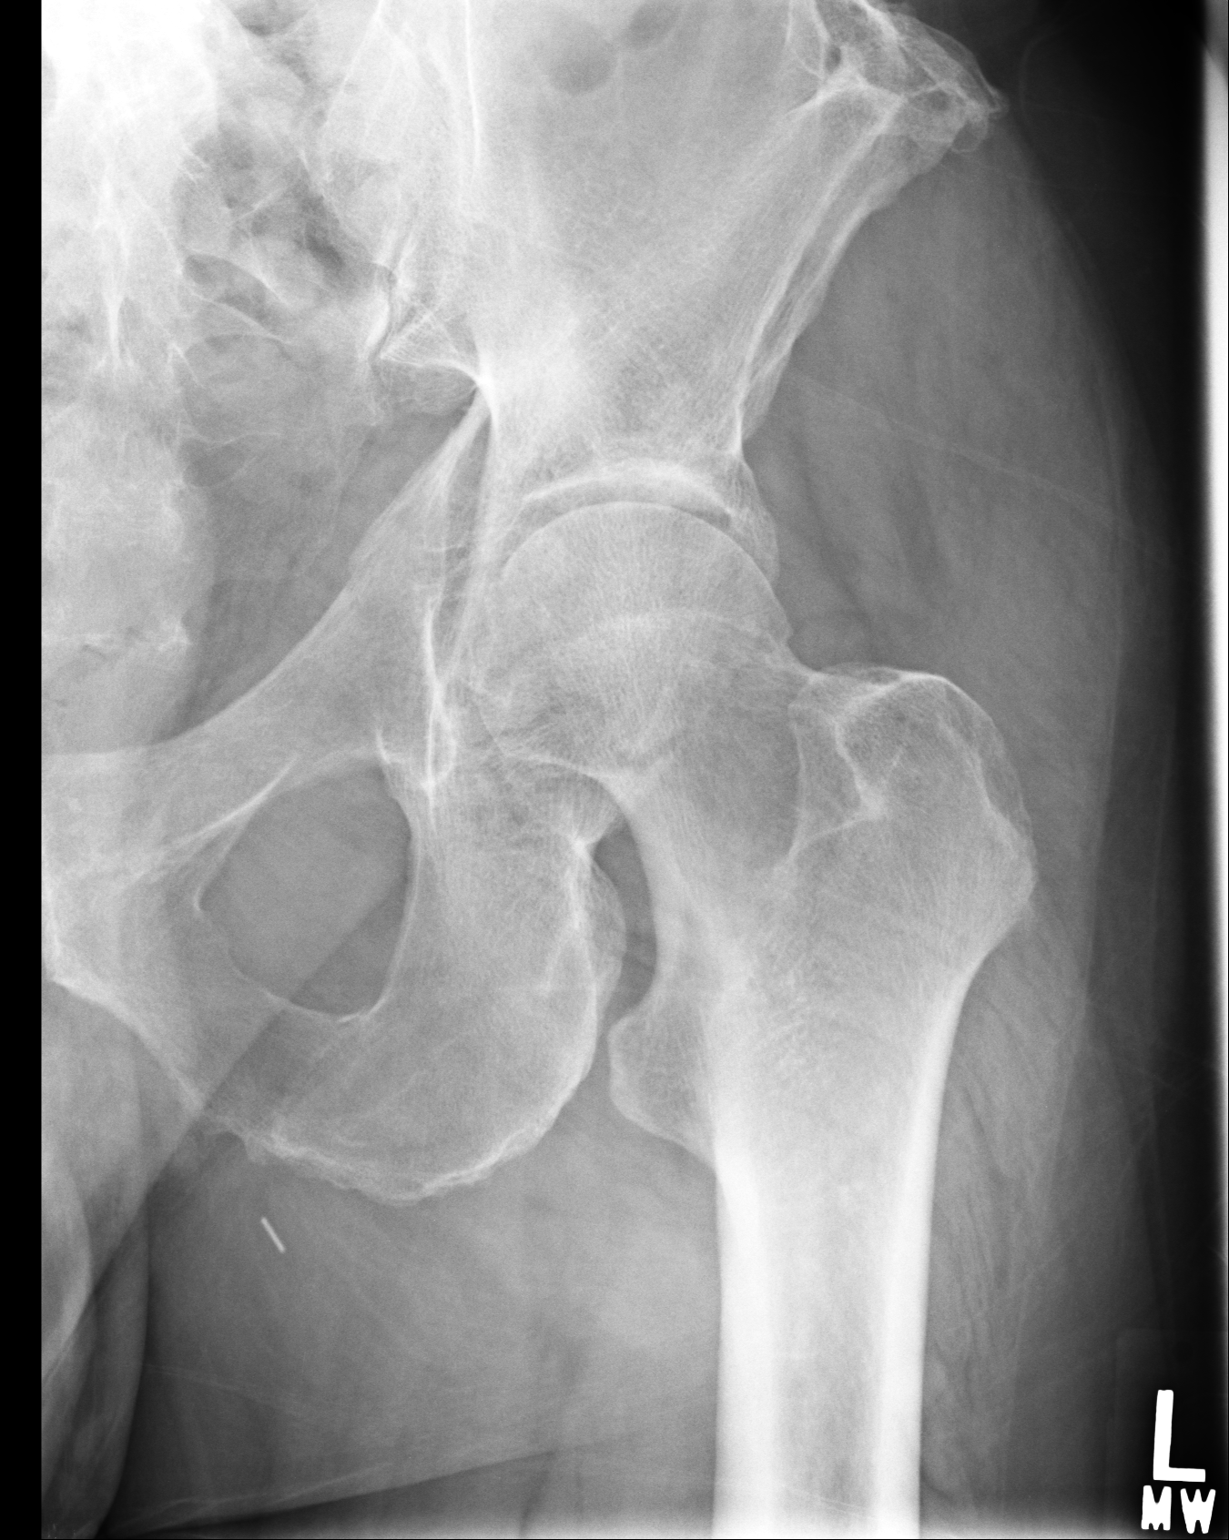
[im 2/2]
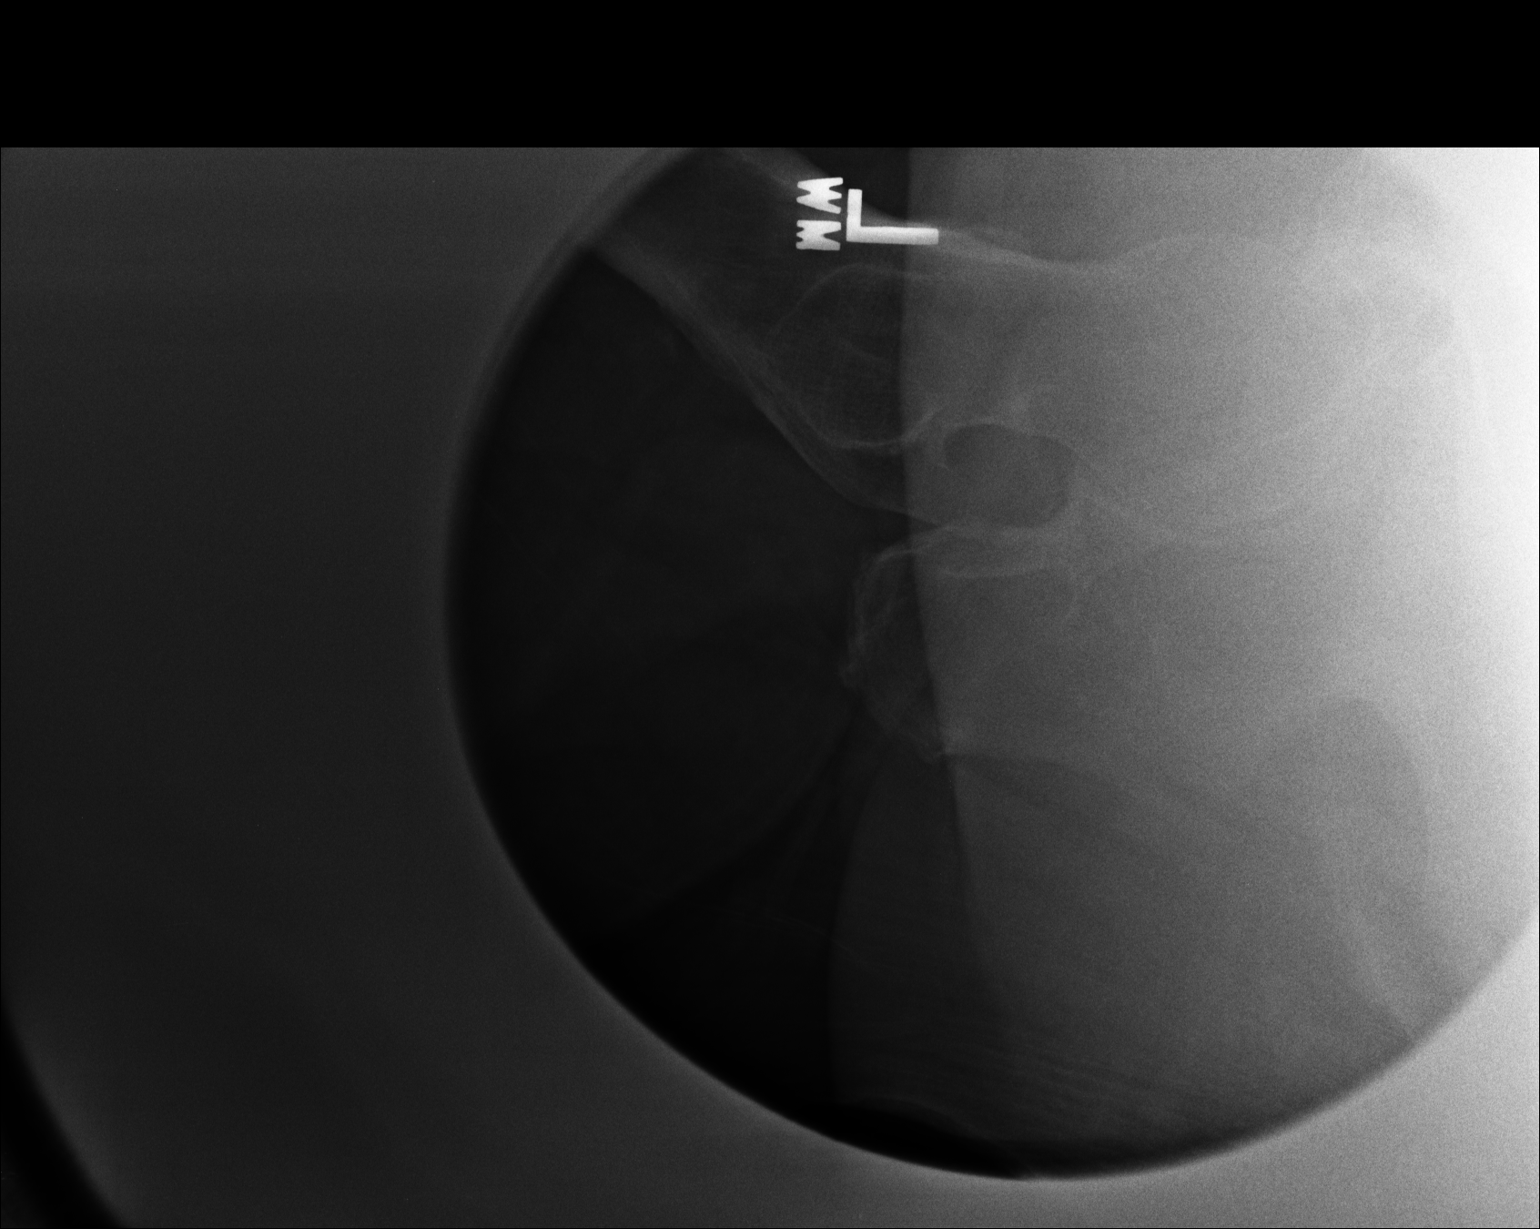

[2 of 2 positions shown; findings below may reference images not displayed]

IMPRESSION: As above.

## 2008-02-29 IMAGING — CR DG THORACIC SPINE 2-3V
1 series · 3 of 3 positions shown · non-contrast
Comparison: None

REASON FOR EXAM: s/p fall from height
COMMENTS:   LMP: (Male)

PROCEDURE:     DXR - DXR THORACIC  AP AND LATERAL  - [DATE]  [DATE]
RESULT:     History: Fall

[Series 1: view not recorded · 0.17mm/px · 3 of 3 slices shown]
[im 1/3]
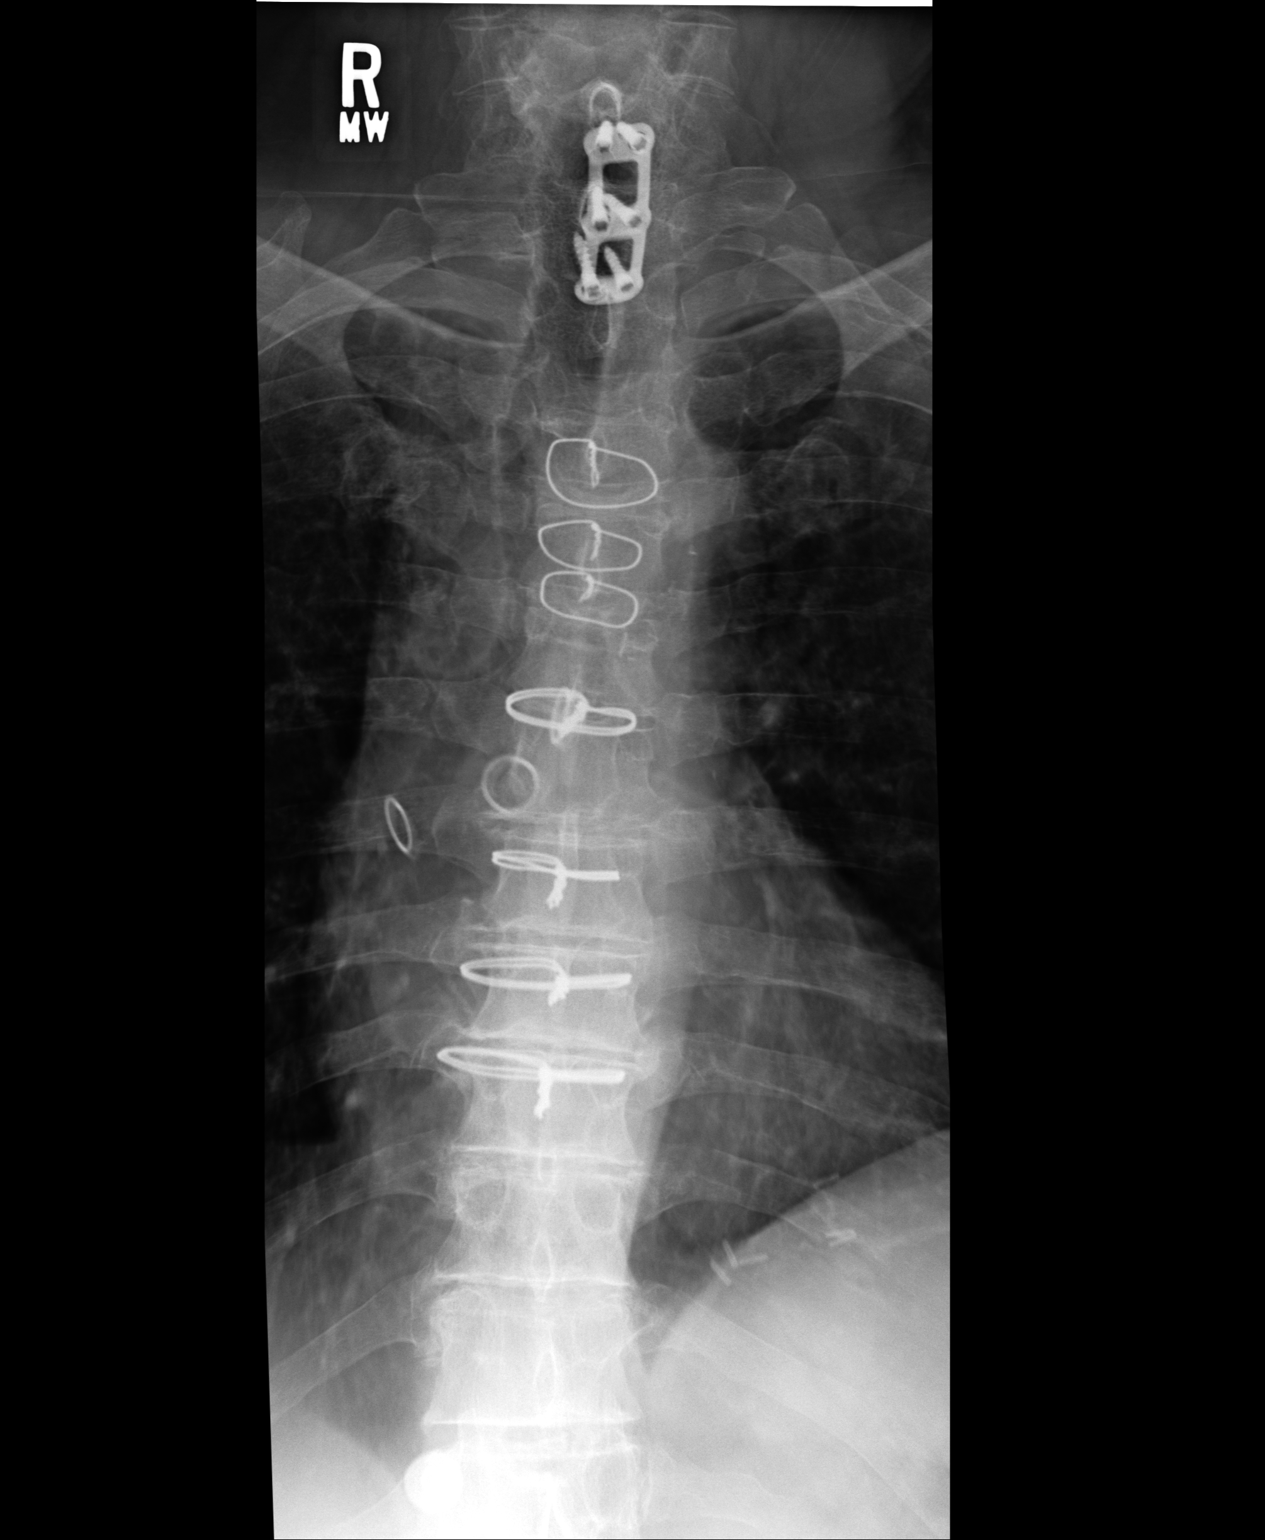
[im 2/3]
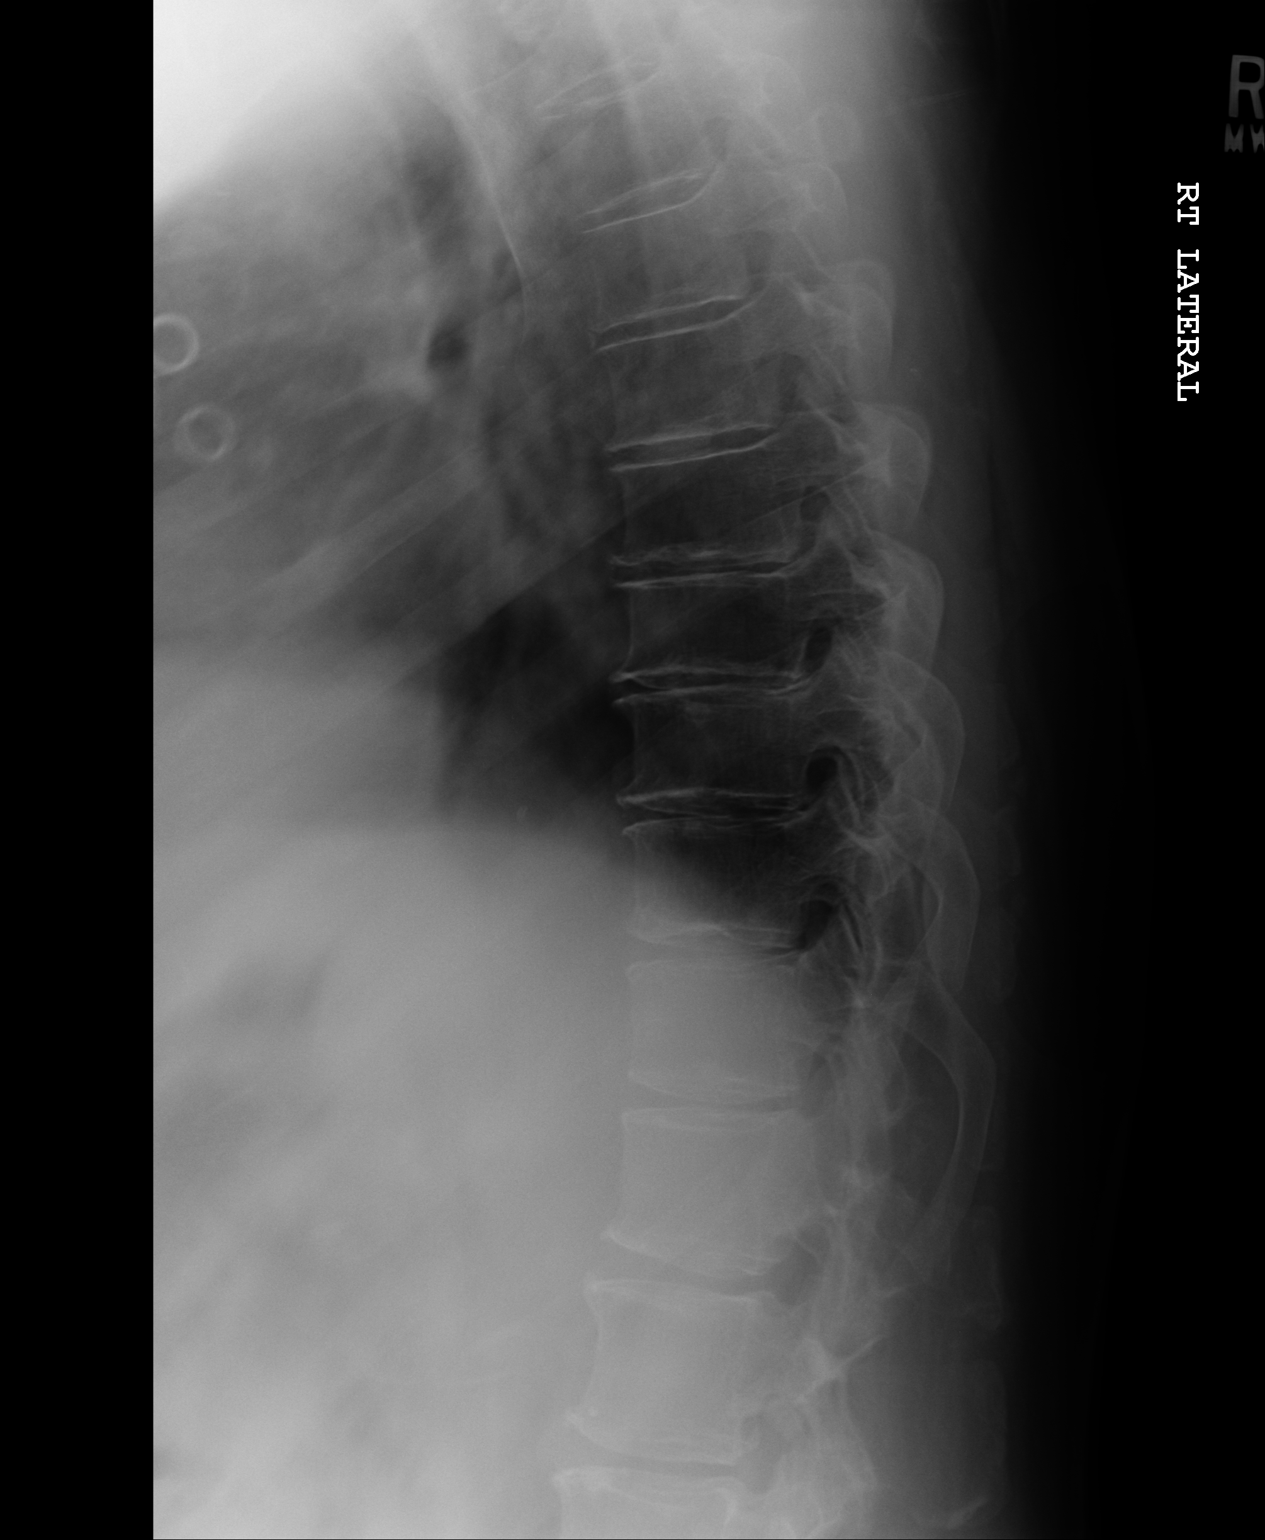
[im 3/3]
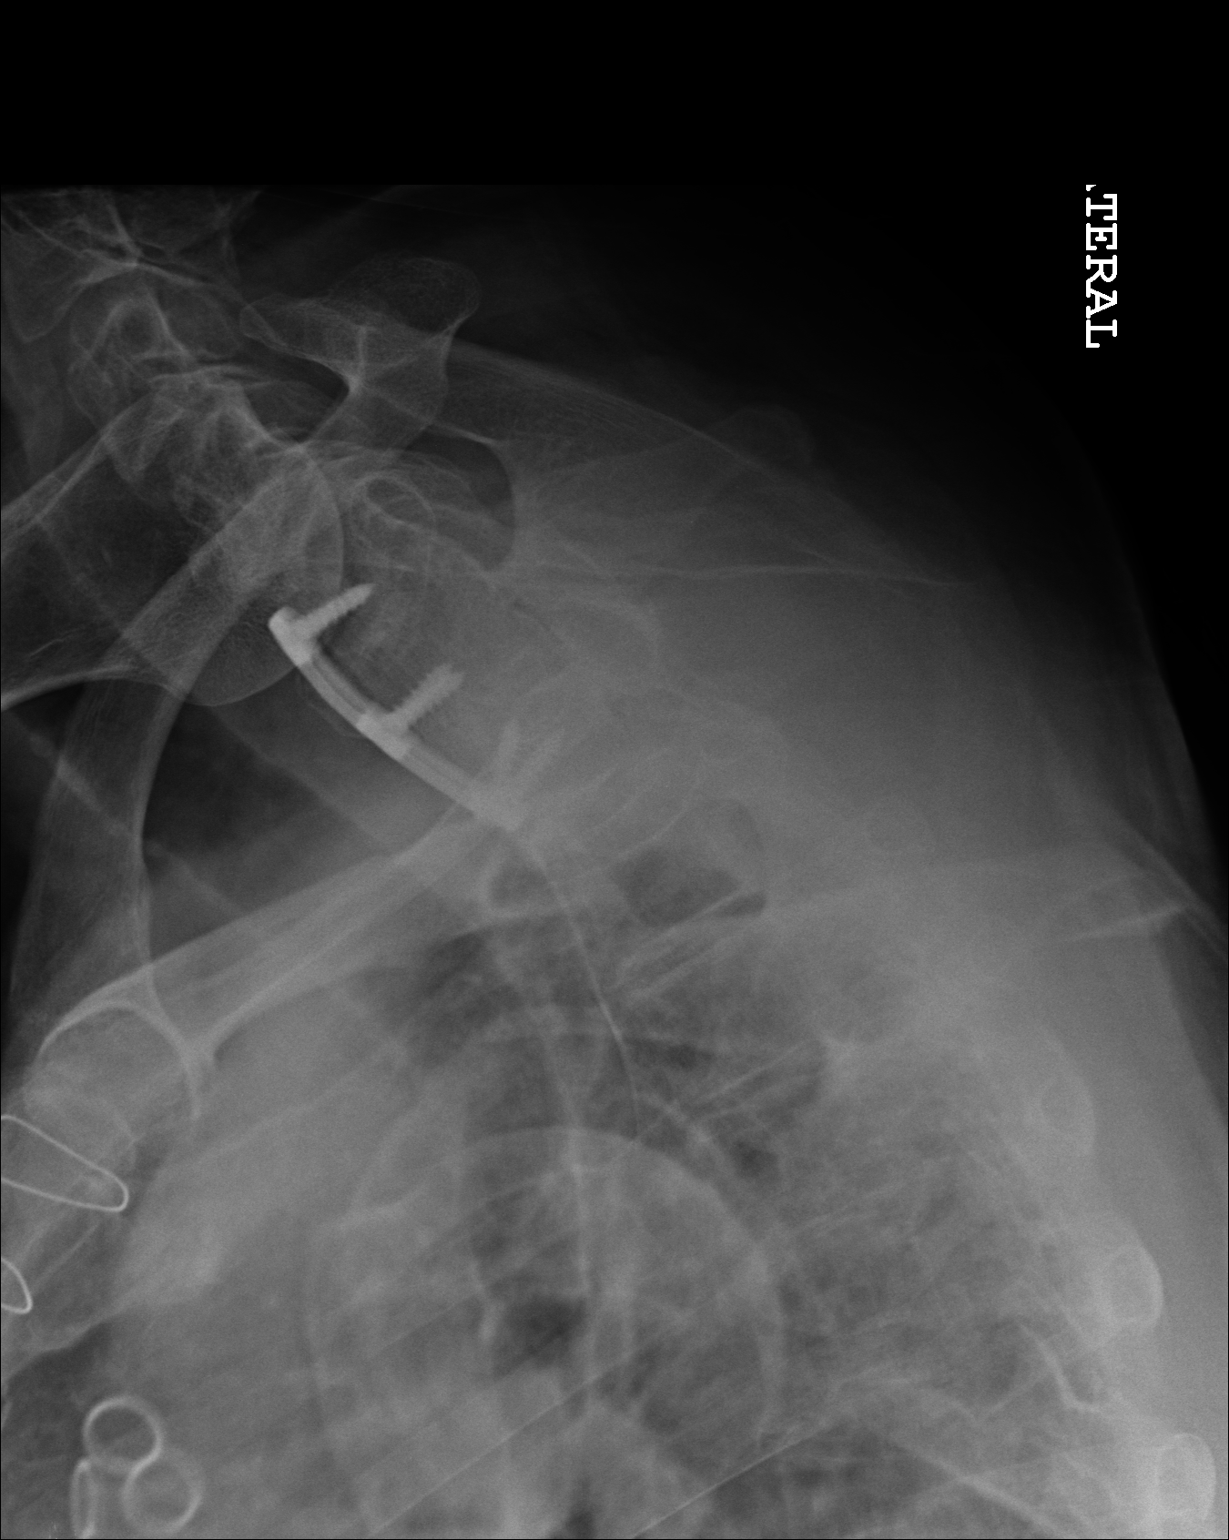

[3 of 3 positions shown; findings below may reference images not displayed]

FINDINGS: AP and lateral views of the thoracic spine are provided. The upper thoracic
spine is obscured by overlying soft tissue and osseous structures. There is
evidence of prior anterior cervical disc fusion. The visualized portions of
the thoracic spine demonstrate maintained vertebral body heights and normal
anatomic alignment. There is no acute fracture or static listhesis. There is
mild degenerative disc disease throughout the thoracic spine.

There is evidence of changes secondary to prior CABG.
IMPRESSION: As above.

## 2008-02-29 IMAGING — CT CT OF THE LEFT HIP WITHOUT CONTRAST
1 of 2 series · 13 of 32 positions shown, 19 images · non-contrast
Comparison: none

REASON FOR EXAM: Acetabular fracture: per Dr. LICO/Ortho bone views of
left hip for acetabular
COMMENTS:   LMP: (Male)

PROCEDURE:     CT  - CT HIP LEFT WITHOUT CONTRAST  - [DATE]  [DATE]
RESULT:     Comparison: No comparison
TECHNIQUE: Multiple axial images obtained of the left hip with sagittal and
coronal reformatted images provided.

[Series 3: hip 3.0 b70s · axial · 0.44mm/px · z∈[-262,-10]mm · 13 of 98 slices shown, 19 images]
[im 7/98  soft-tissue]
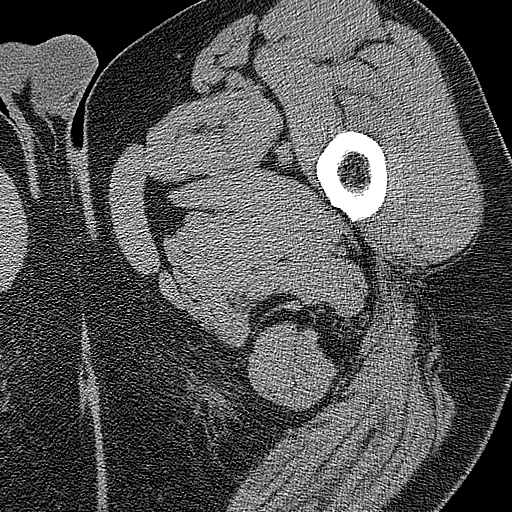
[im 7/98  bone]
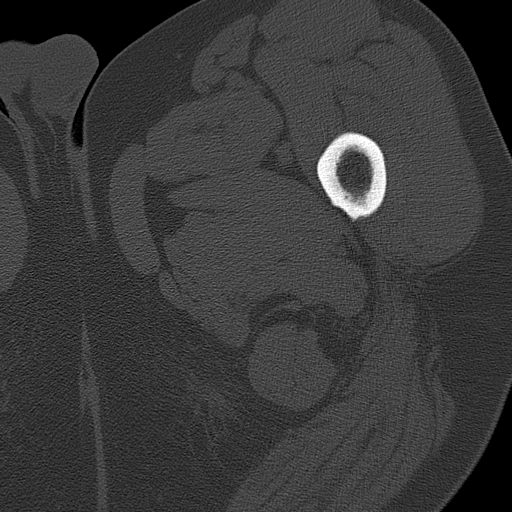
[im 13/98  soft-tissue]
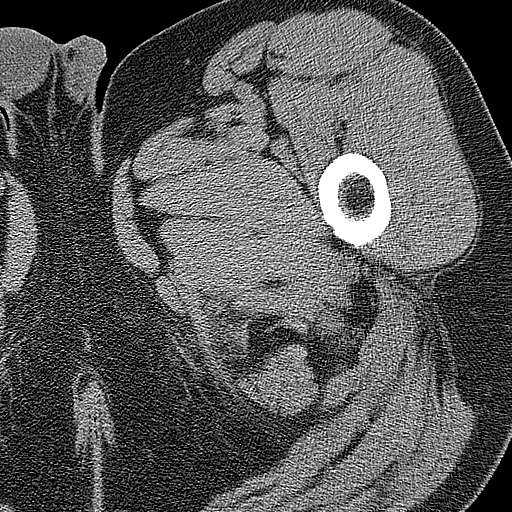
[im 20/98  soft-tissue]
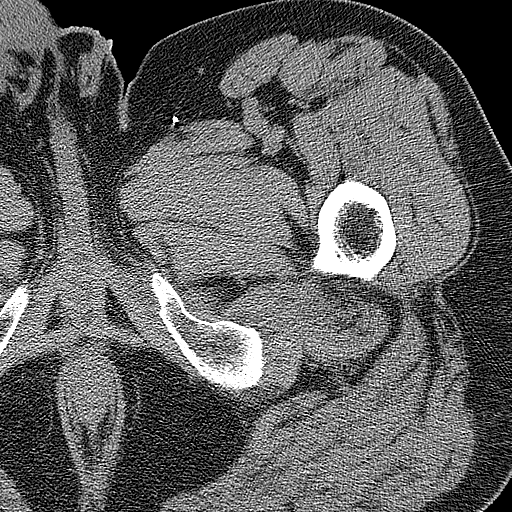
[im 26/98  soft-tissue]
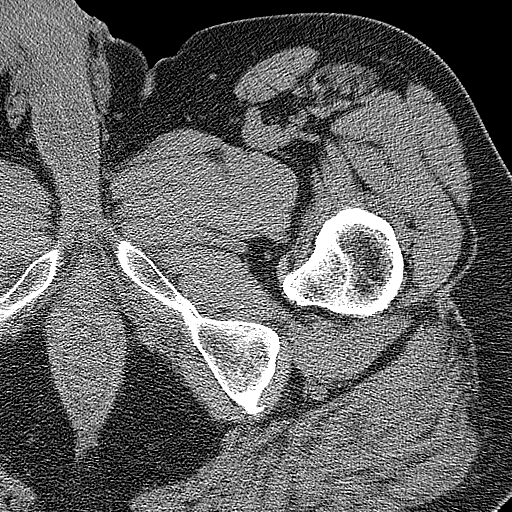
[im 33/98  soft-tissue]
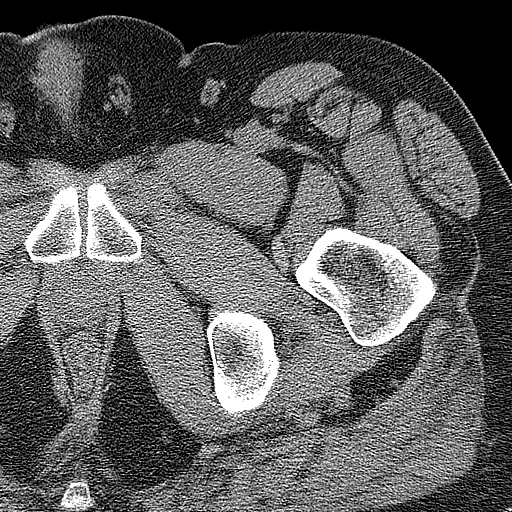
[im 39/98  soft-tissue]
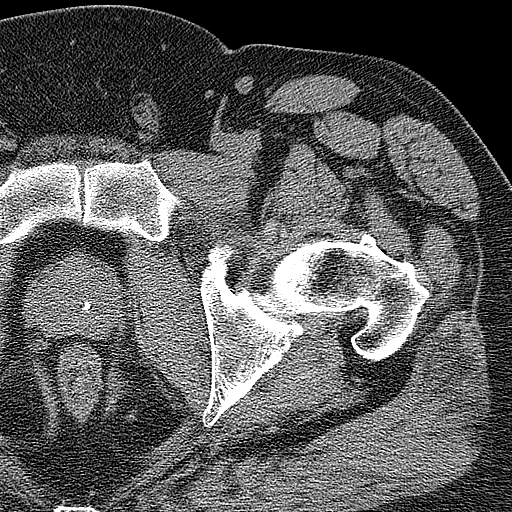
[im 52/98  soft-tissue]
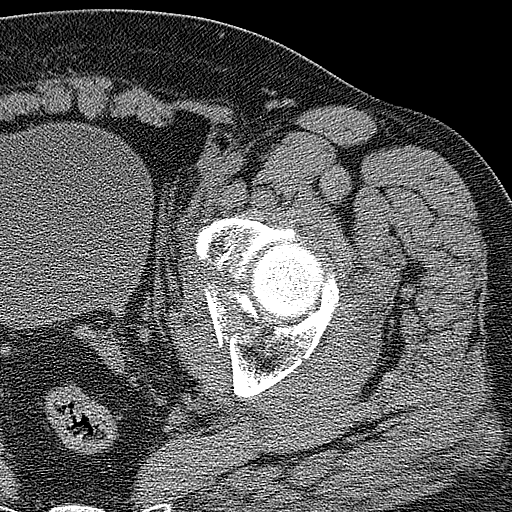
[im 59/98  soft-tissue]
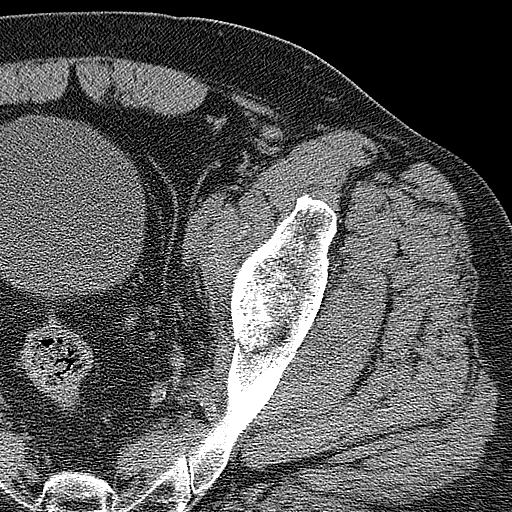
[im 65/98  soft-tissue]
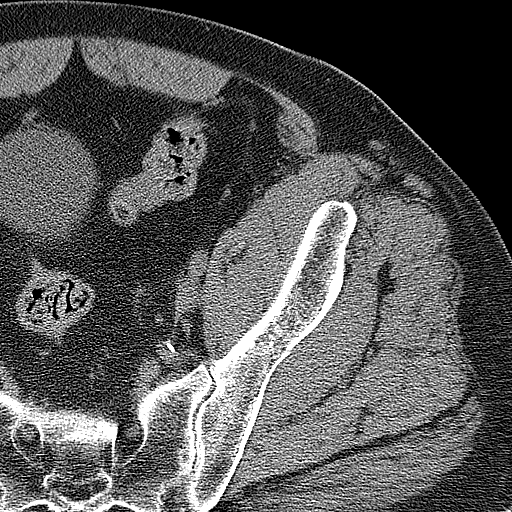
[im 65/98  bone]
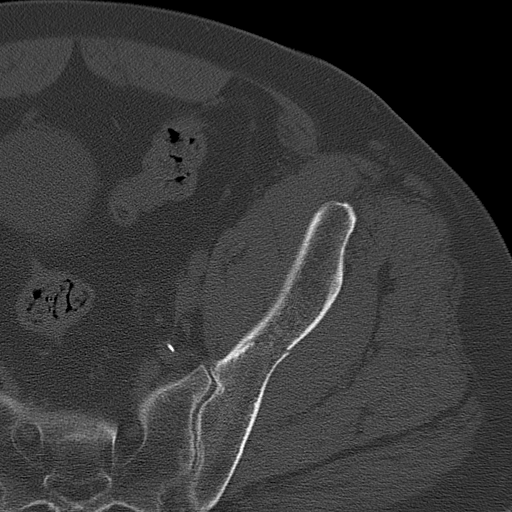
[im 72/98  soft-tissue]
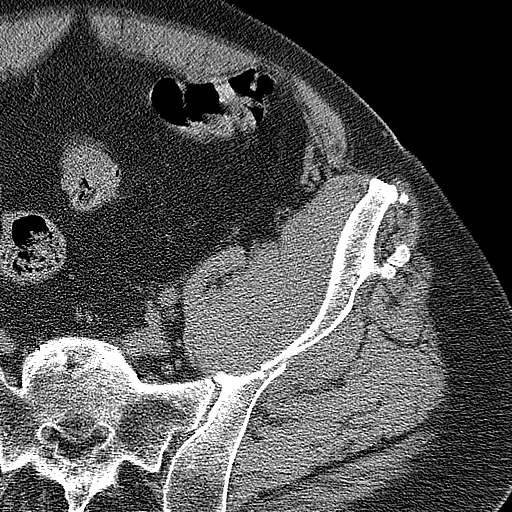
[im 72/98  lung]
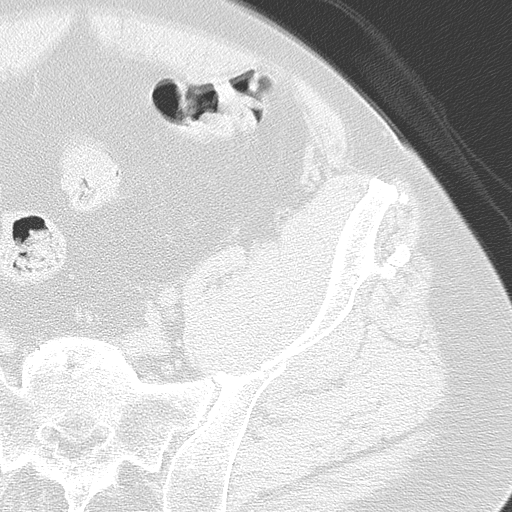
[im 78/98  soft-tissue]
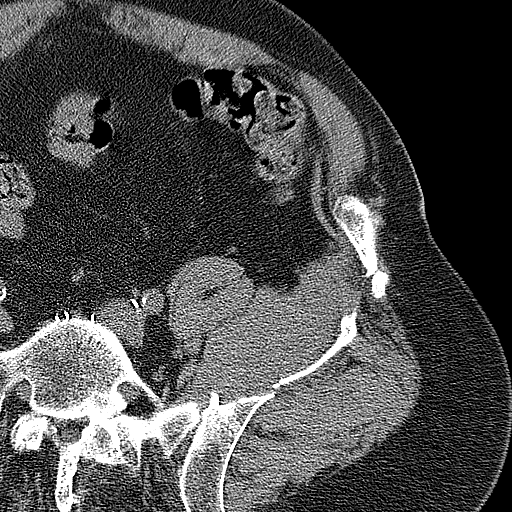
[im 78/98  lung]
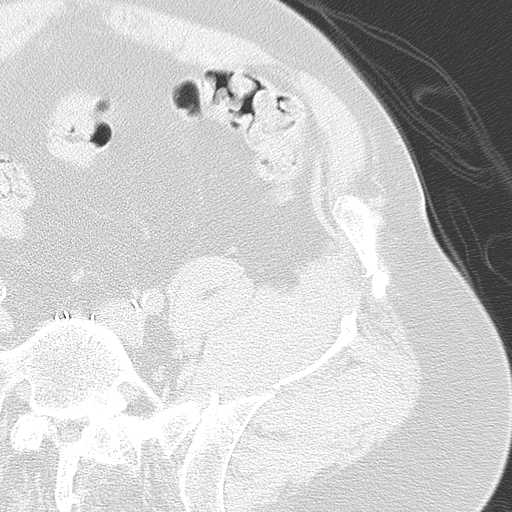
[im 85/98  soft-tissue]
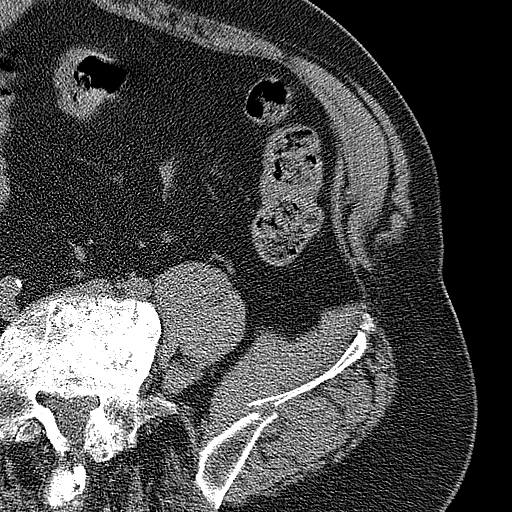
[im 85/98  lung]
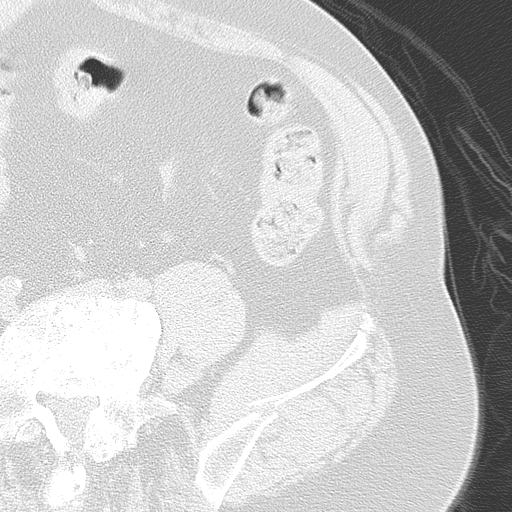
[im 91/98  soft-tissue]
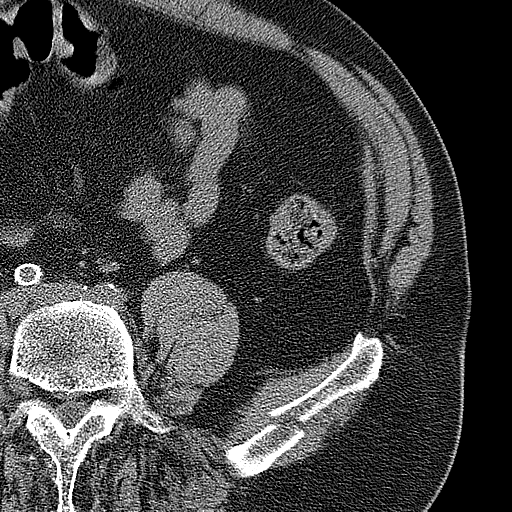
[im 91/98  lung]
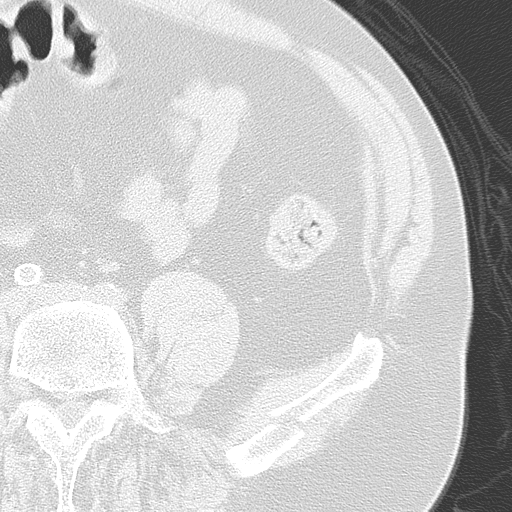

[13 of 32 positions shown; findings below may reference images not displayed]

FINDINGS: There is a comminuted fracture of the left acetabulum involving the anterior
and posterior columns. There no intra-articular fracture fragments. There is
muscular expansion of the left obturator internus likely secondary to edema
from the adjacent fracture. There is a comminuted fracture of the left iliac
wing. There is a nondisplaced fracture of the left inferior pubic ramus.

There is no evidence of a left hip fracture. There is no left hip
dislocation.

There is posttraumatic versus postsurgical deformity of the left ilium.
There are degenerative changes bilateral SI joints.

There is severe degenerative disc disease at L4-L5 and L5-S1. There is facet
arthropathy at L4-L5.
IMPRESSION: Comminuted fracture the left acetabulum involving the anterior and posterior
columns. There is a comminuted fracture of the left ilium.

There is a nondisplaced fracture of the left inferior pubic ramus.

## 2008-02-29 IMAGING — CR DG CHEST 1V
1 series · 1 of 1 positions shown · non-contrast
Comparison: none

REASON FOR EXAM: severe left hip pain
COMMENTS:   LMP: (Male)

PROCEDURE:     DXR - DXR CHEST 1 VIEWAP OR PA  - [DATE]  [DATE]
RESULT:     Comparison: [DATE]

[view not recorded]
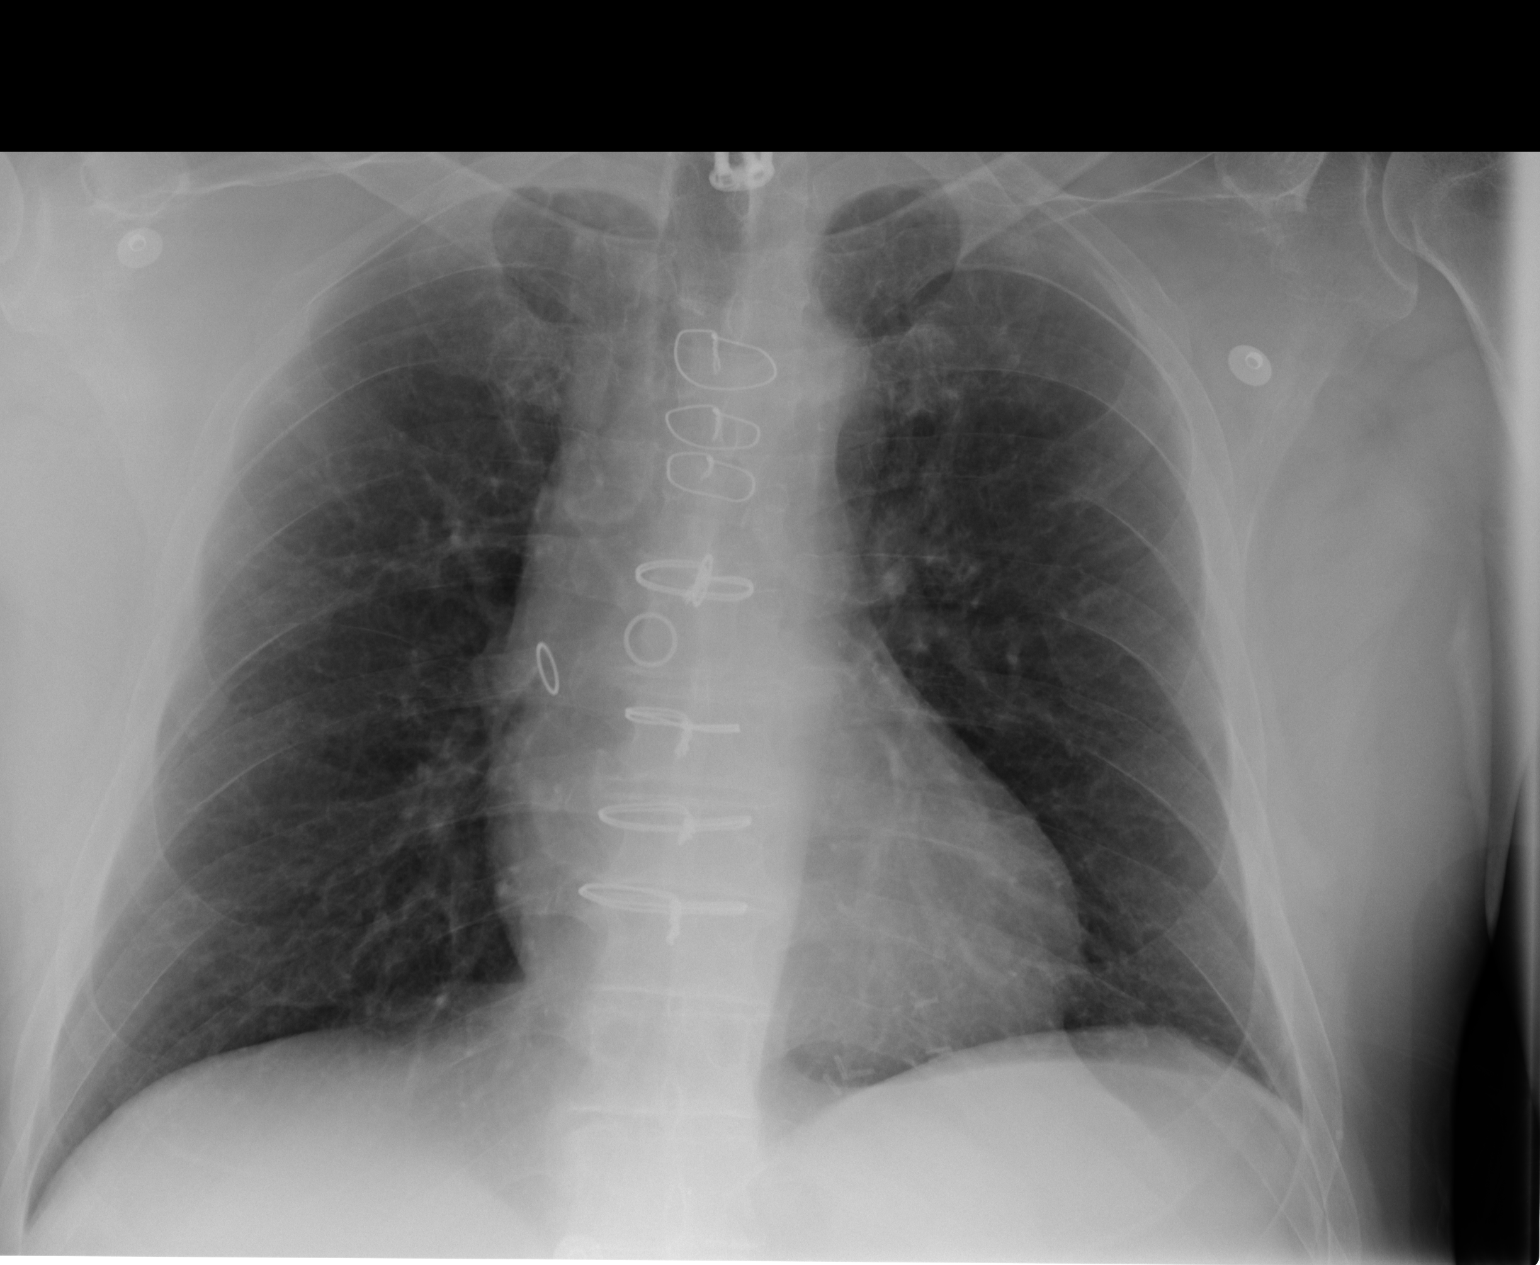

[1 of 1 positions shown; findings below may reference images not displayed]

FINDINGS: Single portable AP chest radiograph is provided. There is no focal
parenchymal opacity, pleural effusion, or pneumothorax. Normal
cardiomediastinal silhouette. Changes from prior CABG. Evidence of prior
anterior cervical disc fusion.
IMPRESSION: No acute disease of the chest.

## 2008-02-29 IMAGING — CR DG FEMUR 2V*L*
1 series · 5 of 5 positions shown · non-contrast
Comparison: none

REASON FOR EXAM: left femur pain s/p pain
COMMENTS:   LMP: (Male)

PROCEDURE:     DXR - DXR FEMUR LEFT  - [DATE]  [DATE]
RESULT:     Findings:
Five views of the left femur demonstrates no left femoral fracture or
dislocation. There is a comminuted fracture of the left acetabulum.

[Series 1: view not recorded · 0.17mm/px · 5 of 5 slices shown]
[im 1/5]
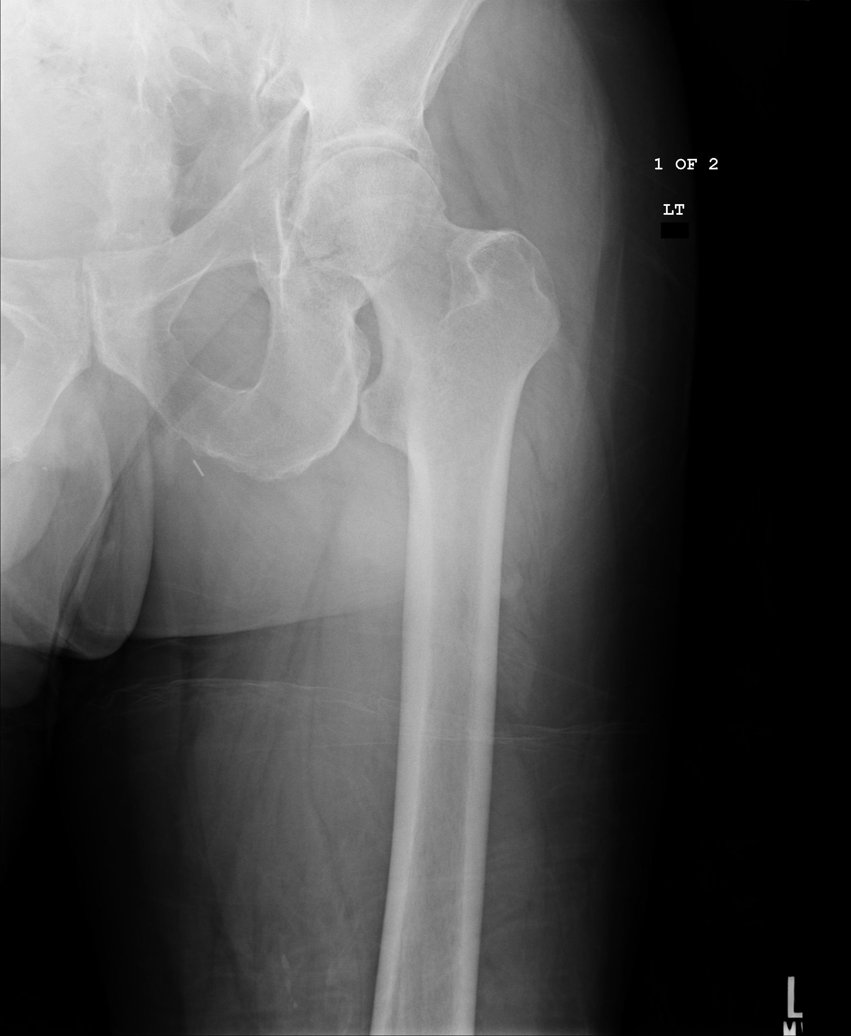
[im 2/5]
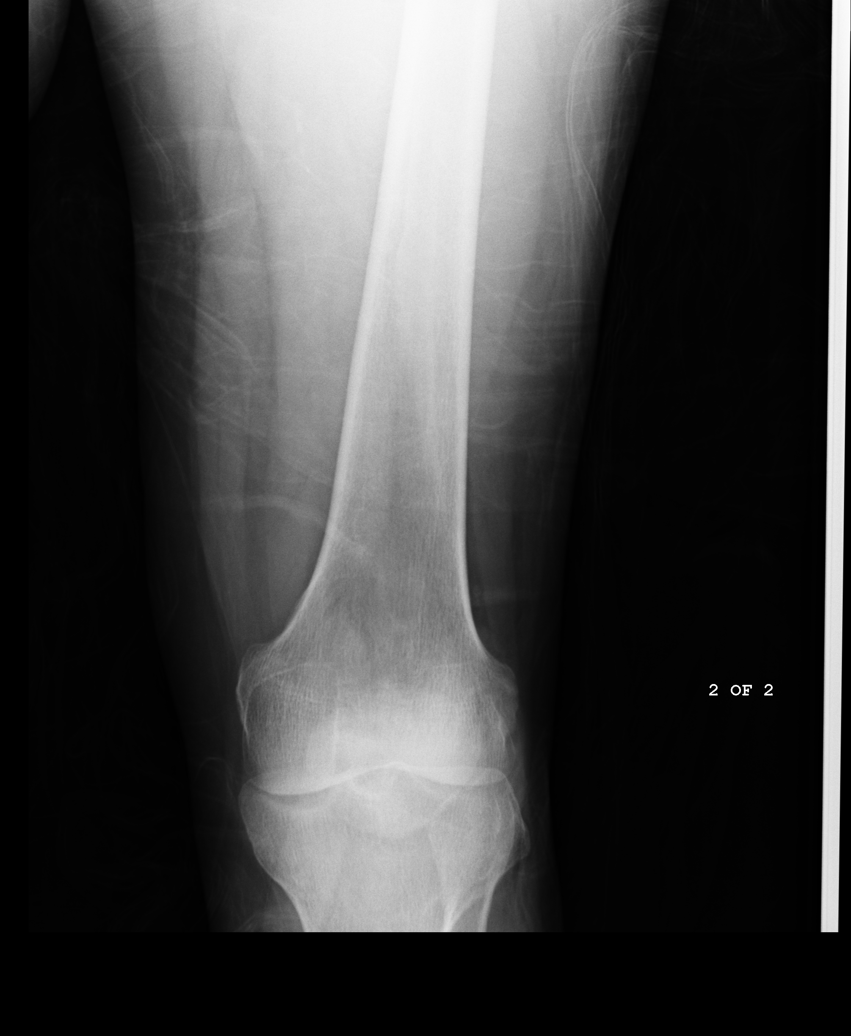
[im 3/5]
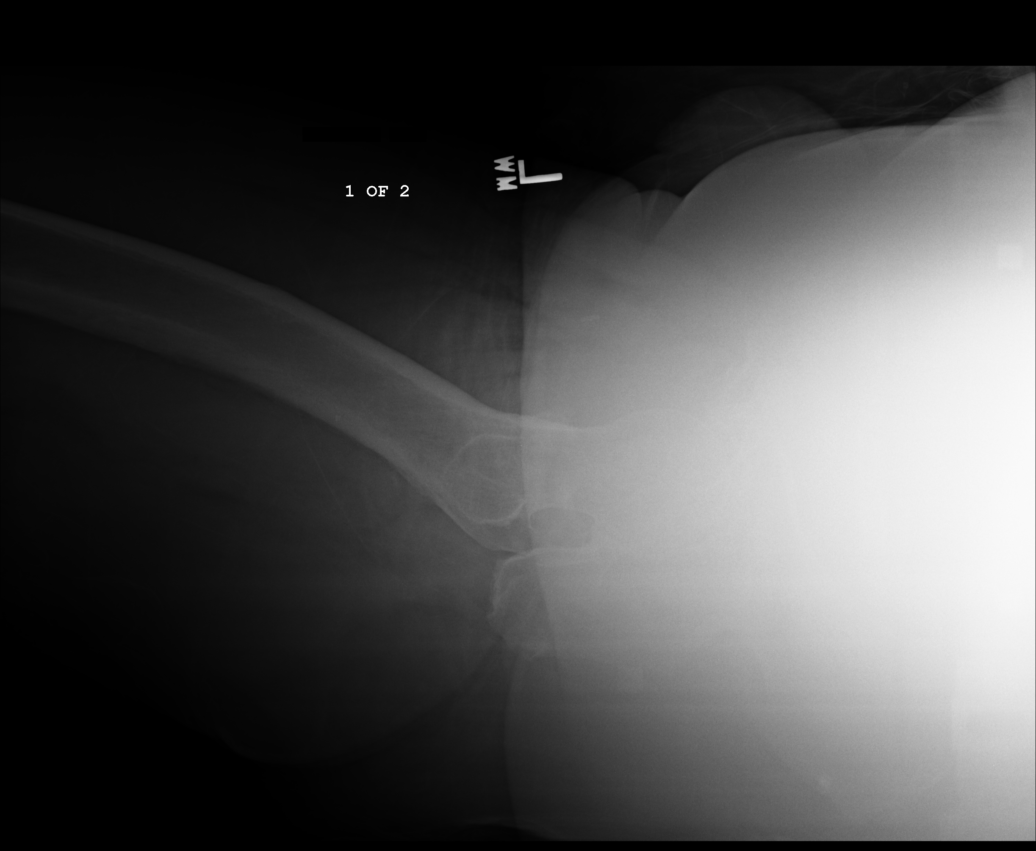
[im 4/5]
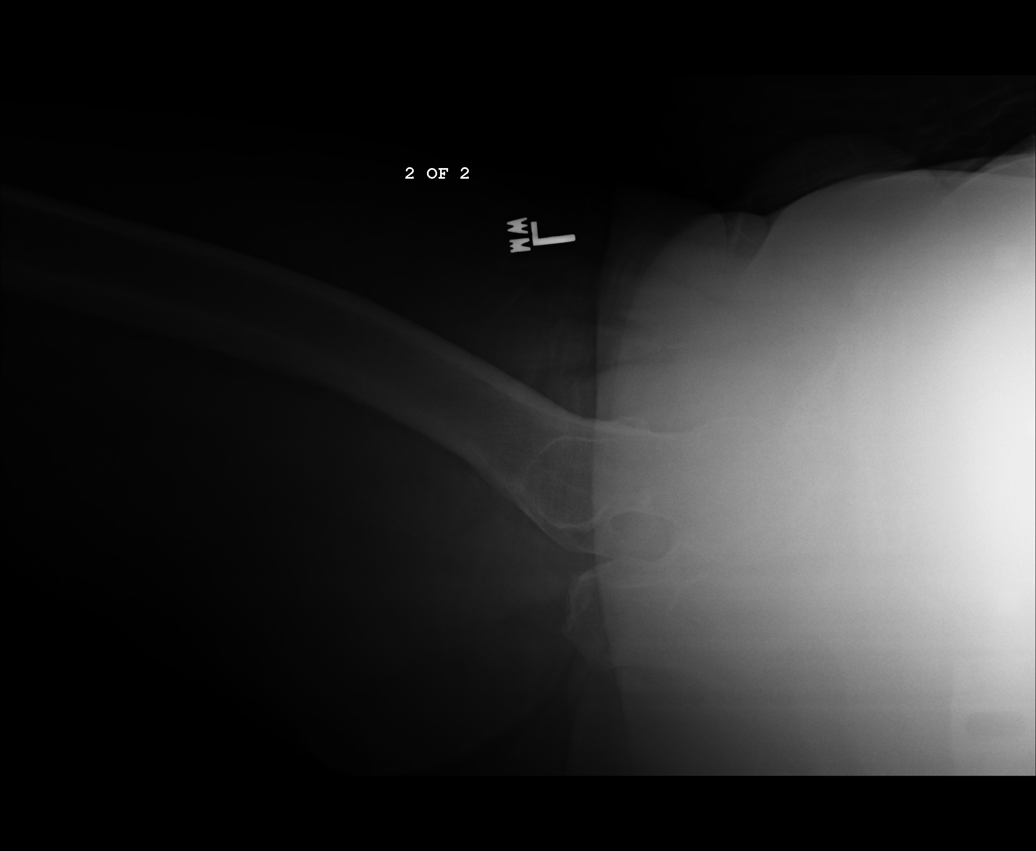
[im 5/5]
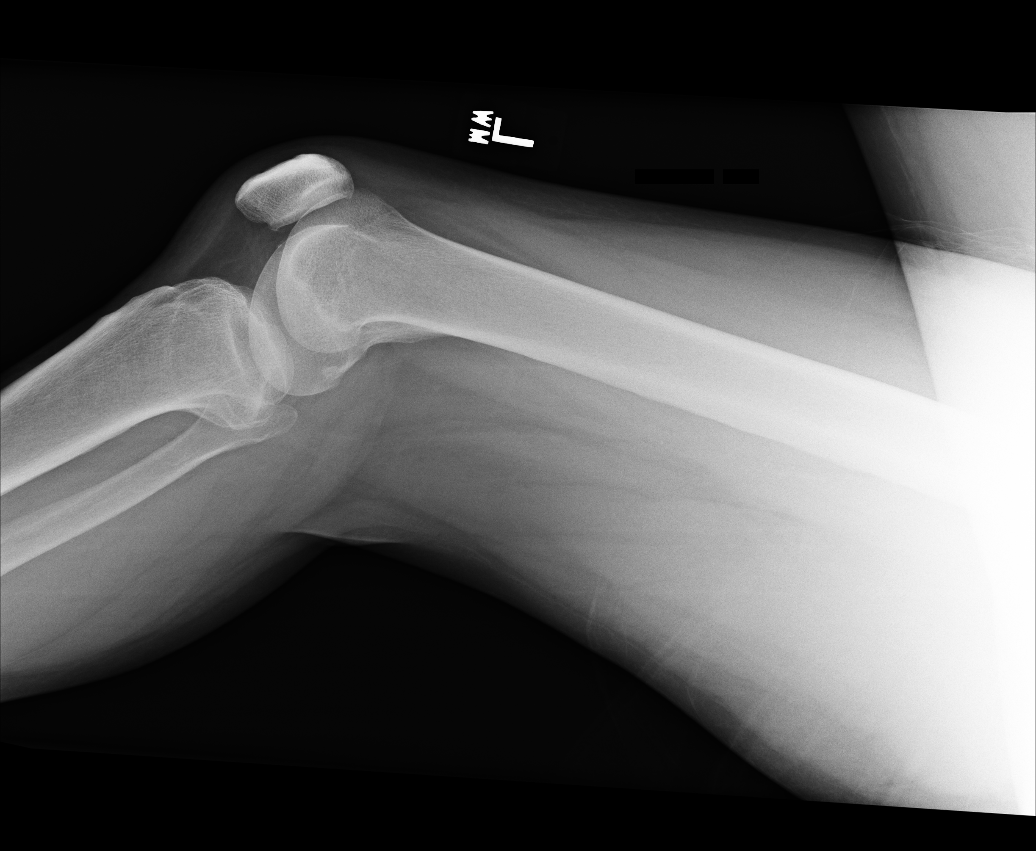

[5 of 5 positions shown; findings below may reference images not displayed]

IMPRESSION: As above.

## 2008-03-08 ENCOUNTER — Other Ambulatory Visit: Payer: Self-pay

## 2008-03-08 ENCOUNTER — Inpatient Hospital Stay: Payer: Self-pay | Admitting: Internal Medicine
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
## 2008-03-08 IMAGING — CR DG CHEST 1V PORT
1 series · 1 of 1 positions shown · non-contrast
Comparison: none

REASON FOR EXAM: Chest Pain
COMMENTS:

[view not recorded]
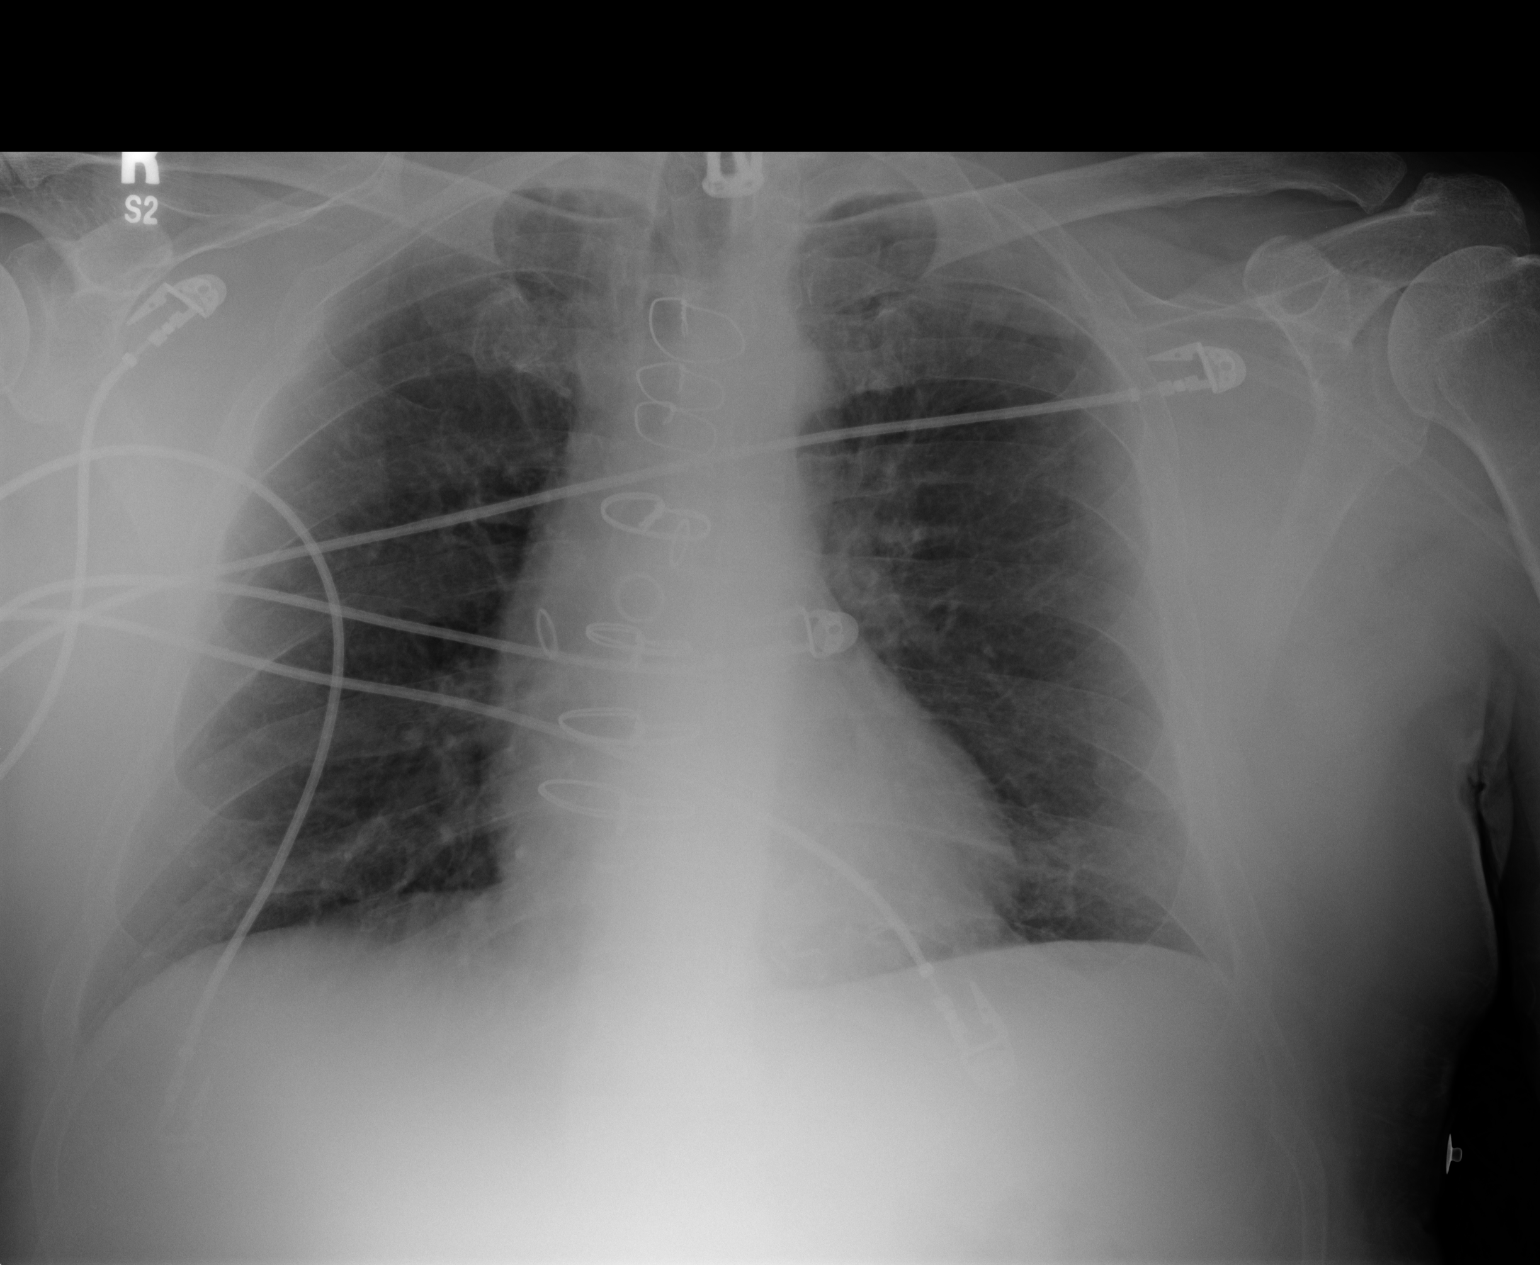

[1 of 1 positions shown; findings below may reference images not displayed]

PROCEDURE:     DXR - DXR PORTABLE CHEST SINGLE VIEW  - [DATE]  [DATE]

RESULT:     Comparison is made to the prior exam of [DATE]. The lung
fields are clear. Heart size is normal. No pleural effusion or pulmonary
edema is seen. Postoperative changes of prior CABG are noted. Also observed
are postoperative changes of prior cervical surgery. Monitoring electrodes
are present.
IMPRESSION: 1.     No acute changes are identified.

## 2009-05-12 ENCOUNTER — Inpatient Hospital Stay: Payer: Self-pay | Admitting: *Deleted

## 2009-05-12 IMAGING — CR NECK SOFT TISSUES - 1+ VIEW
1 series · 1 of 1 positions shown · non-contrast
Comparison: none

REASON FOR EXAM: sob
COMMENTS:

PROCEDURE:     DXR - DXR SOFT TISSUE NECK  - [DATE]  [DATE]
RESULT:     Findings: Single lateral radiograph of the neck soft tissues
demonstrates no radiopaque foreign body. The airway is patent. The
prevertebral soft tissues are normal.

[view not recorded]
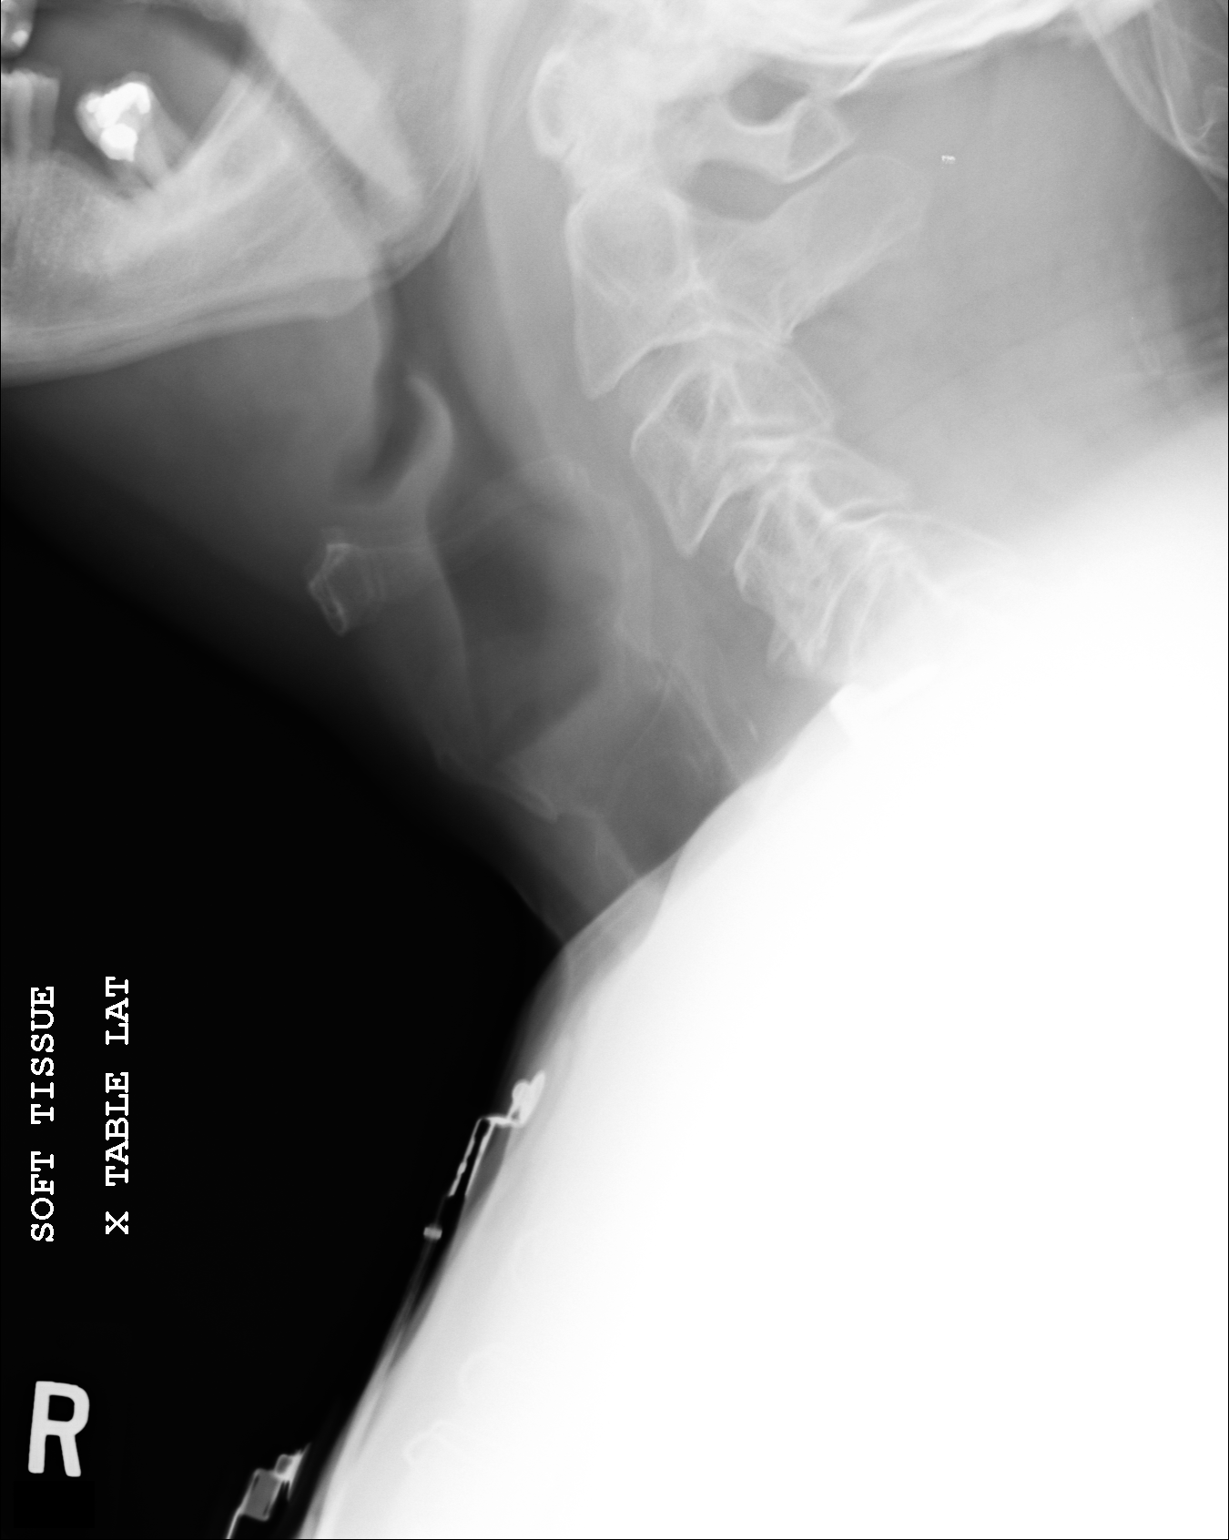

[1 of 1 positions shown; findings below may reference images not displayed]

IMPRESSION: Unremarkable lateral radiograph of the neck soft tissues.

## 2009-05-12 IMAGING — CT CT NECK-CHEST W/ CM
2 series · 10 of 14 positions shown, 12 images · non-contrast
Comparison: None

REASON FOR EXAM: chest and neck pain
COMMENTS:

PROCEDURE:     CT  - CT NECK AND CHEST W  - [DATE]  [DATE]
RESULT:     Indication: Neck and chest pain
TECHNIQUE: Multiple axial images of the neck and chest are obtained with 100
mL of [L5] intravenous contrast. Sagittal and coronal reformatted
images are provided.

[Series 4: soft tissue · axial · 0.79mm/px · z∈[-92,+306]mm · 7 of 169 slices shown, 9 images]
[im 22/169  soft-tissue]
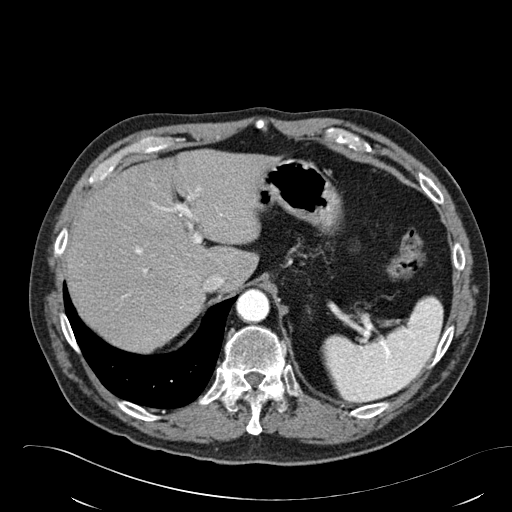
[im 22/169  bone]
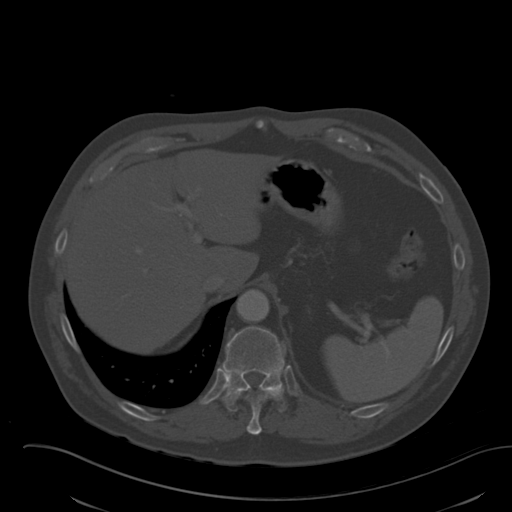
[im 43/169  bone]
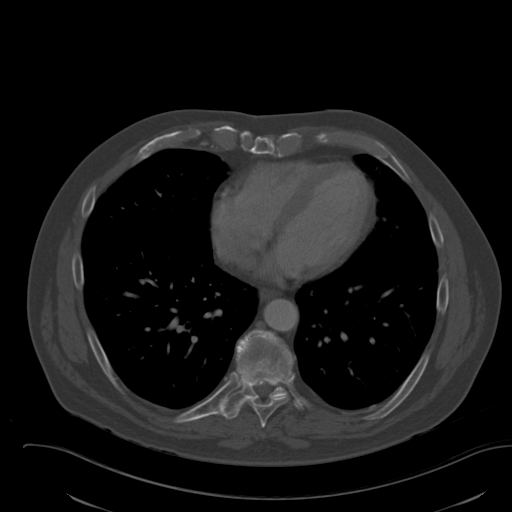
[im 64/169  bone]
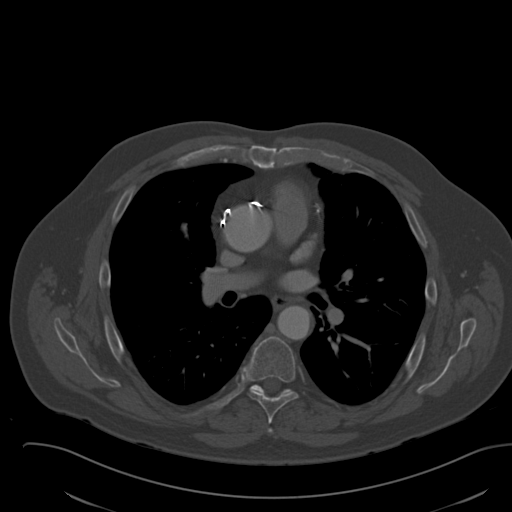
[im 85/169  bone]
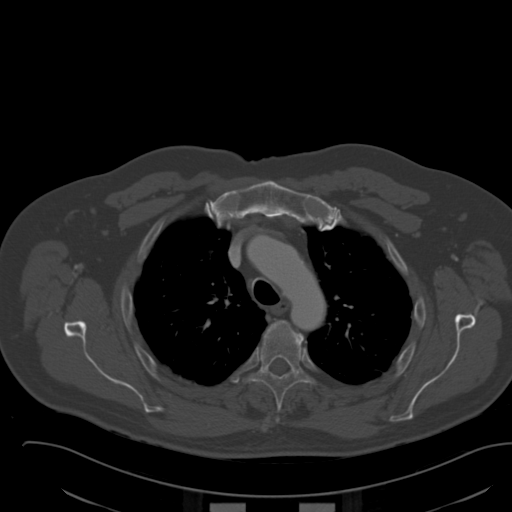
[im 106/169  soft-tissue]
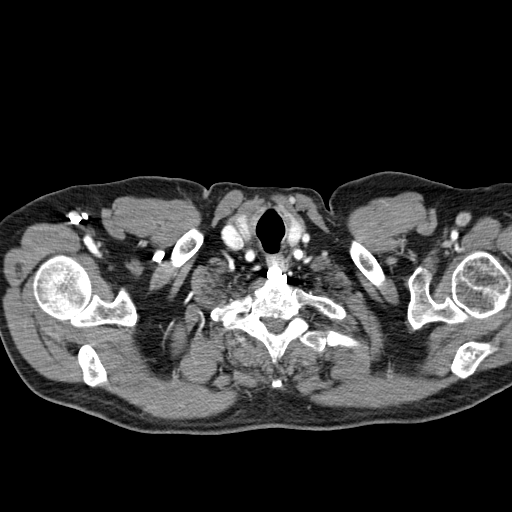
[im 106/169  bone]
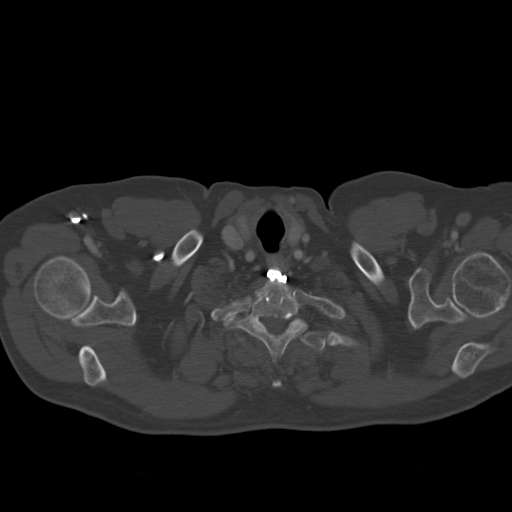
[im 127/169  bone]
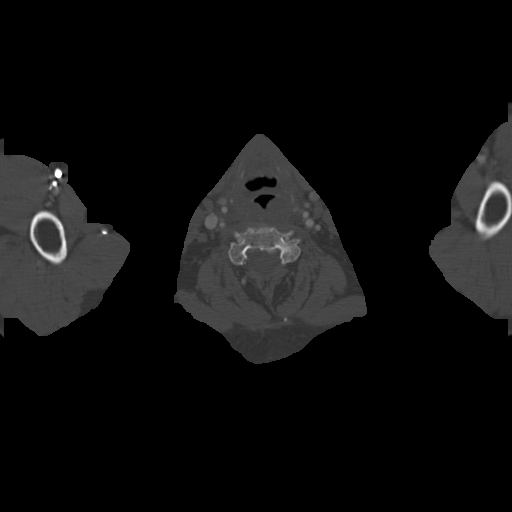
[im 148/169  bone]
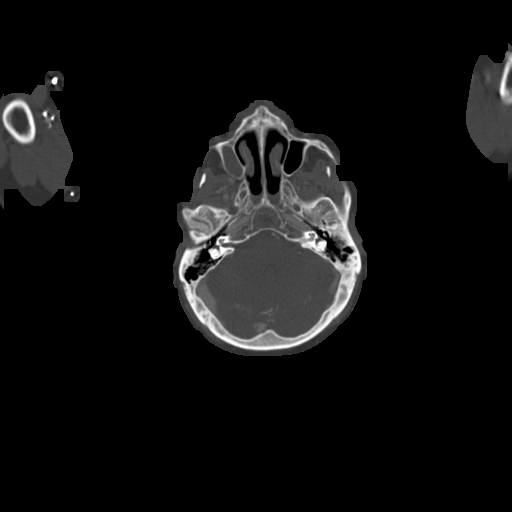

[Series 5: lung windows · axial · 0.79mm/px · z∈[-72,+78]mm · 3 of 101 slices shown]
[im 26/101  bone]
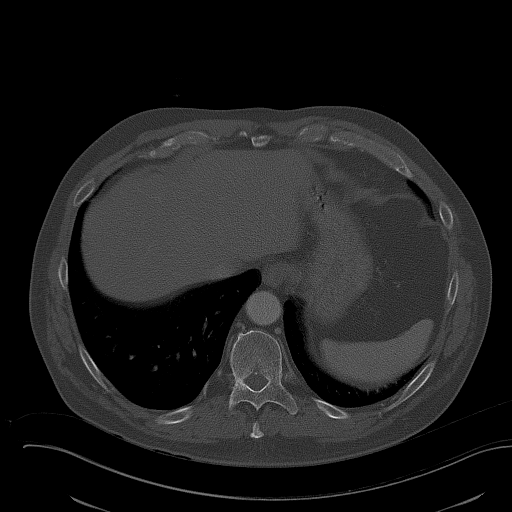
[im 51/101  bone]
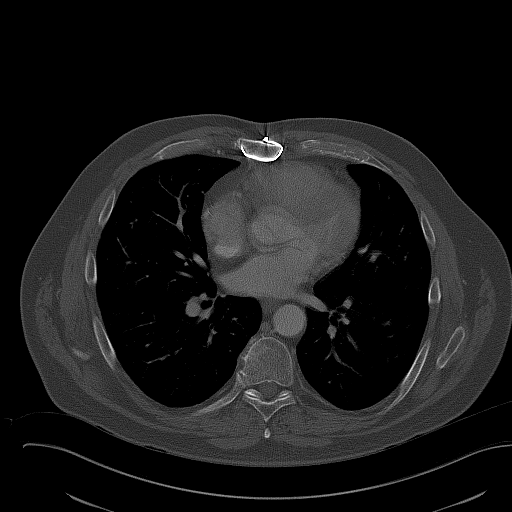
[im 76/101  bone]
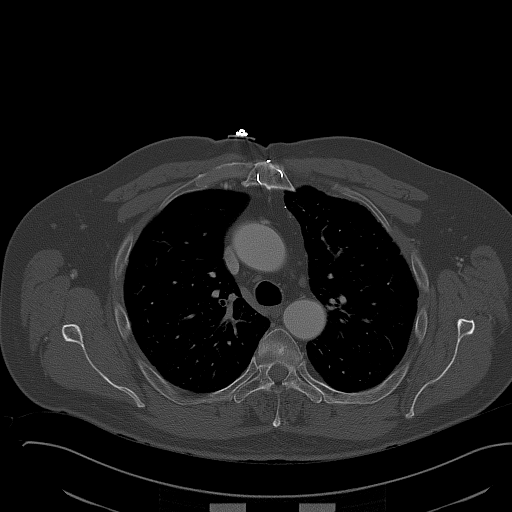

[10 of 14 positions shown; findings below may reference images not displayed]

FINDINGS: NECK:

There is moderate soft tissue thickening and fat stranding involving
bilateral parapharyngeal spaces in the region of the vallecula and piriform
sinuses. There is prevertebral soft tissue thickening measuring up to 7 mm.
There is no focal fluid collection to suggest an abscess.

The imaged intracranial structures are normal. There is no evidence for
space-occupying lesion or intracranial hemorrhage. There is no evidence for
cortical-based area of infarction, mass effect, or extra-axial fluid
collections.  Ventricles and sulci are appropriate for the patient's age and
the basal cisterns are patent.

There is mucosal thickening involving the right maxillary sinus.

There is anterior cervical disc fusion from C5 through C7 without hardware
failure or complication. There are laminectomies at C3, C4, and C5.

CHEST:

The central airways are patent. There is mild biapical fibrosis. The
remainder of the lung fields are clear. There is no pleural effusion or
pneumothorax.

There are no pathologically enlarged axillary, hilar, or mediastinal lymph
nodes.

The heart size is normal. There is no pericardial effusion. The thoracic
aorta is normal in caliber. There is evidence of prior median sternotomy and
CABG.

Review of bone windows demonstrates no focal lytic or sclerotic lesions.

Limited noncontrast images of the upper abdomen were obtained. The adrenal
glands appear normal. The remainder of the visualized abdominal organs are
unremarkable.
IMPRESSION: 1. There is moderate soft tissue thickening and fat stranding involving
bilateral parapharyngeal spaces in the region of the vallecula and piriform
sinuses. There is prevertebral soft tissue thickening measuring up to 7 mm.
There is no focal fluid collection to suggest an abscess. This is likely
secondary to an inflammatory or infectious etiology versus less likely
neoplasm. ENT consultation recommended.
2. No radiopaque foreign bodies.

## 2009-05-12 IMAGING — CR DG CHEST 1V PORT
1 series · 1 of 1 positions shown · non-contrast
Comparison: none

REASON FOR EXAM: CP and SOB
COMMENTS:

PROCEDURE:     DXR - DXR PORTABLE CHEST SINGLE VIEW  - [DATE]  [DATE]
RESULT:     Comparison: [DATE]

[view not recorded]
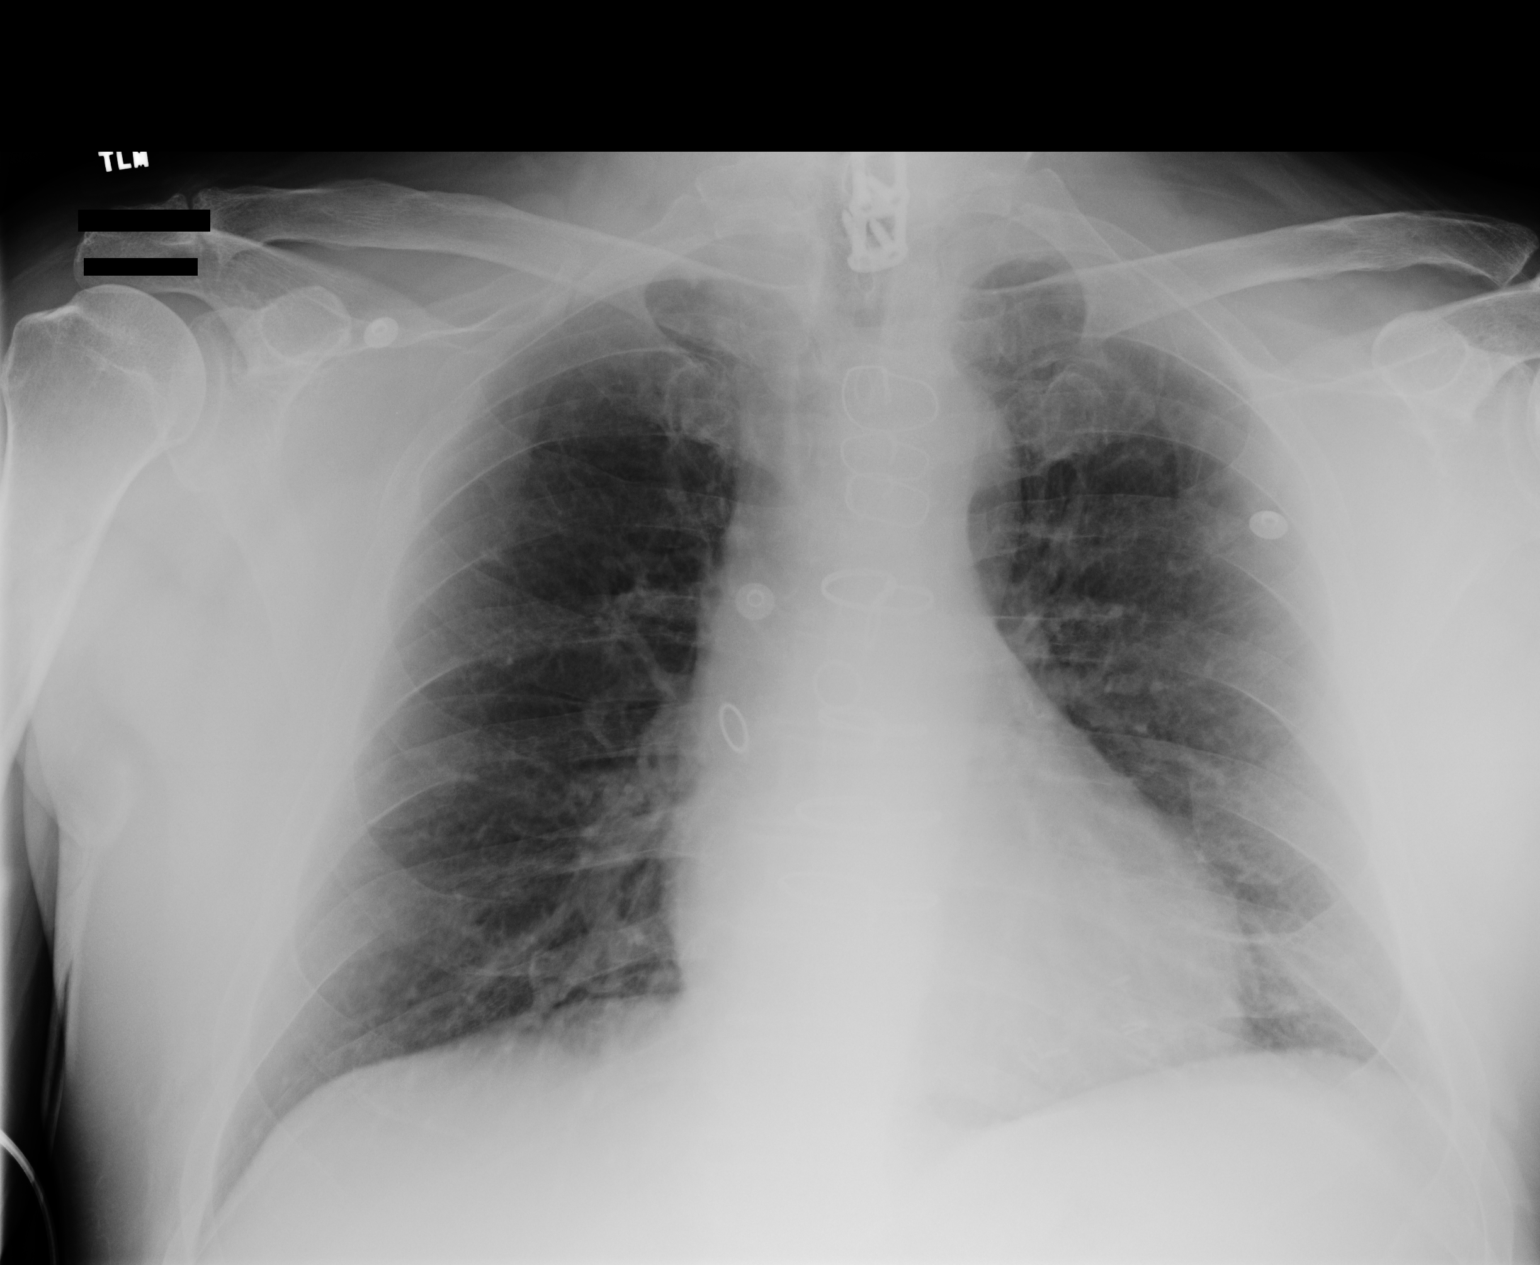

[1 of 1 positions shown; findings below may reference images not displayed]

FINDINGS: Single portable AP chest radiograph is provided. There is no focal
parenchymal opacity, pleural effusion, or pneumothorax. There is linear
airspace disease at the left lung base likely representing atelectasis.
Normal cardiomediastinal silhouette. There is evidence of prior CABG. The
osseous structures are unremarkable.
IMPRESSION: No acute disease of the chest.

## 2009-05-13 IMAGING — CR DG CHEST 1V PORT
1 series · 1 of 1 positions shown · non-contrast
Comparison: none

REASON FOR EXAM: shortness of braeth
COMMENTS:

PROCEDURE:     DXR - DXR PORTABLE CHEST SINGLE VIEW  - [DATE] [DATE]
RESULT:     Comparison: [DATE]

[view not recorded]
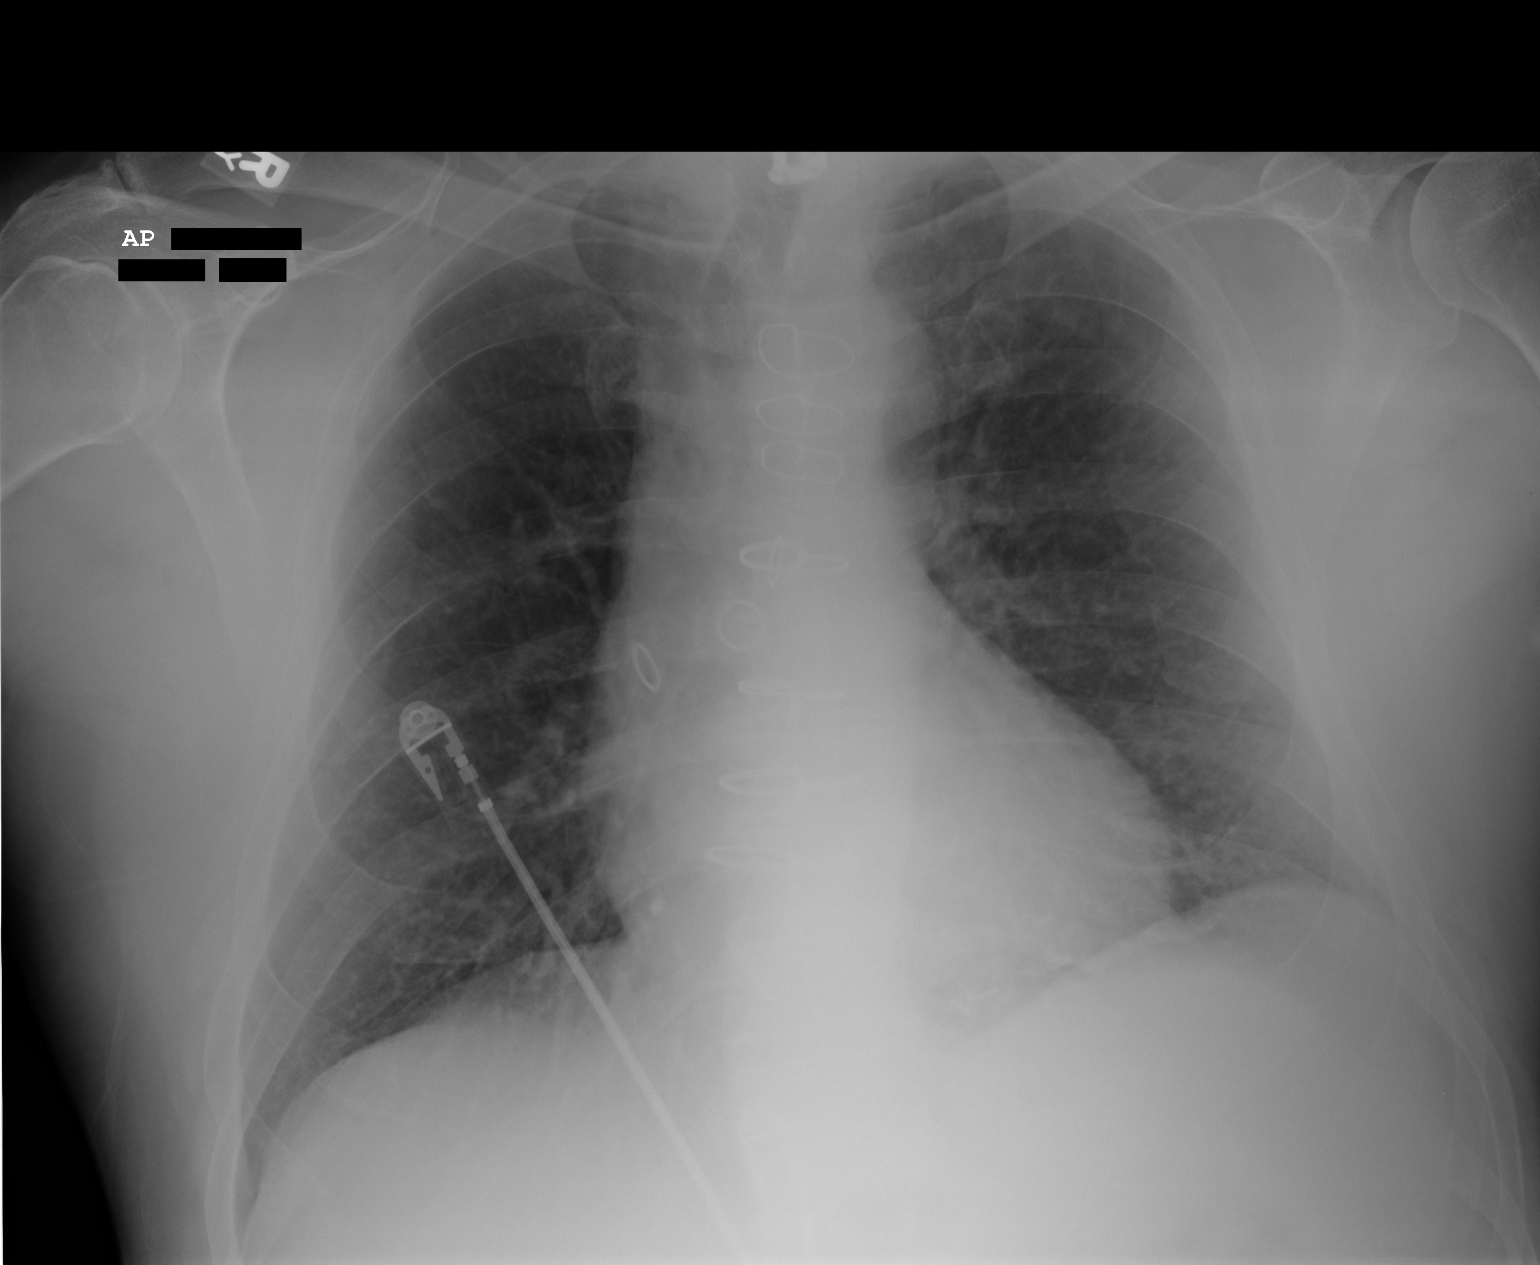

[1 of 1 positions shown; findings below may reference images not displayed]

FINDINGS: Single portable AP chest radiograph is provided. There is no focal
parenchymal opacity, pleural effusion, or pneumothorax. Normal
cardiomediastinal silhouette. There is evidence of prior CABG. The osseous
structures are unremarkable.
IMPRESSION: No acute disease of the chest.

## 2009-05-14 IMAGING — US US RENAL KIDNEY
1 series · 17 of 25 positions shown · non-contrast
Comparison: none

REASON FOR EXAM: acute renal failure, h/o bladder cancer r/o
hydronephrosis
COMMENTS:

[Series 1: us renal kidney · 17 of 33 slices shown]
[im 1/33]
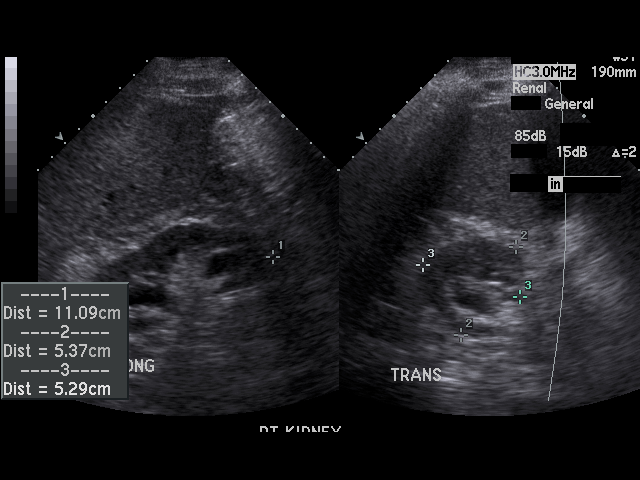
[im 3/33]
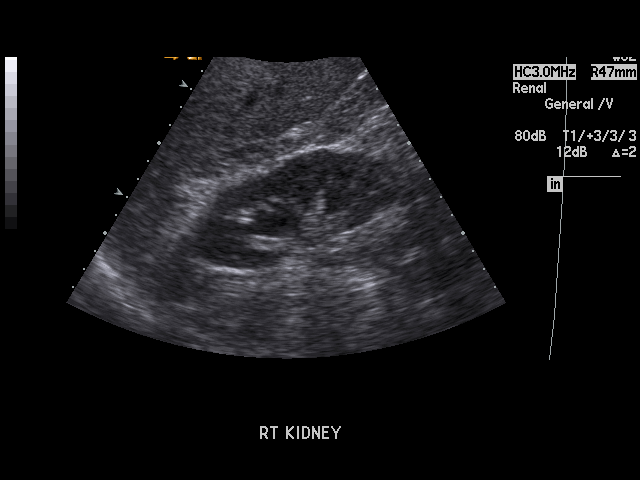
[im 5/33]
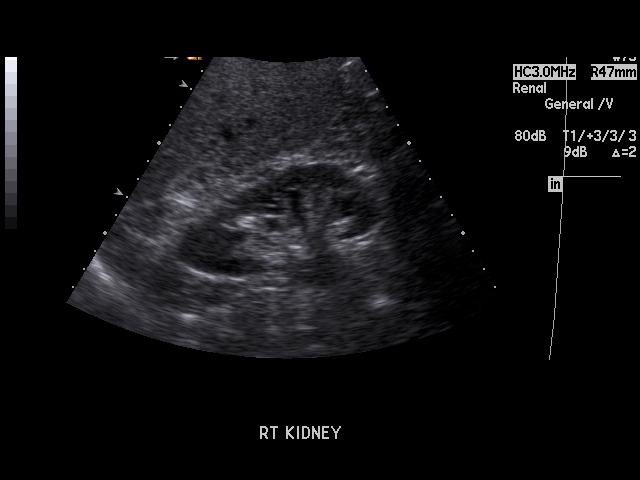
[im 7/33]
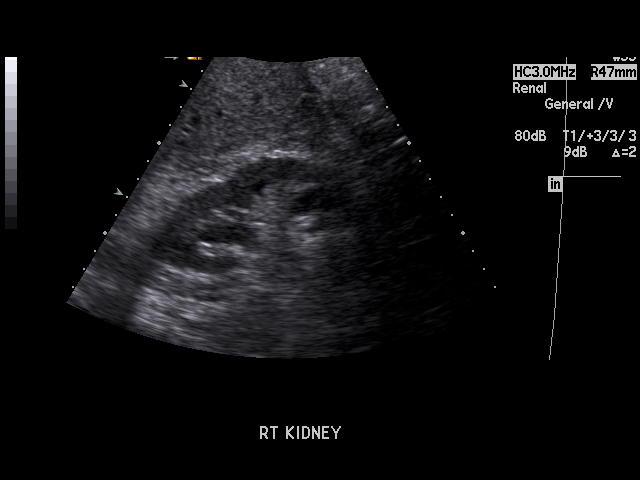
[im 9/33]
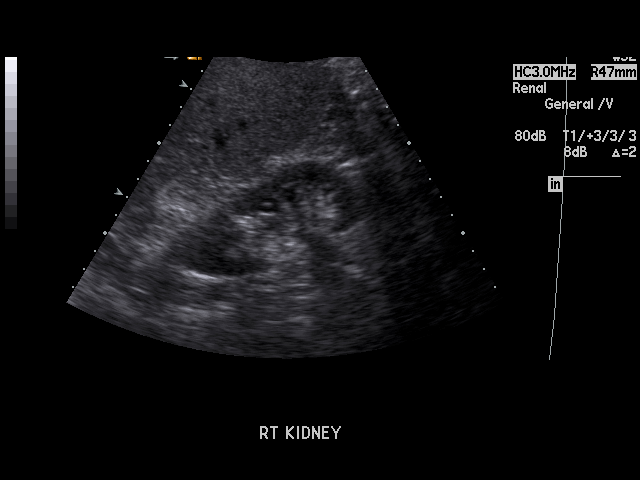
[im 11/33]
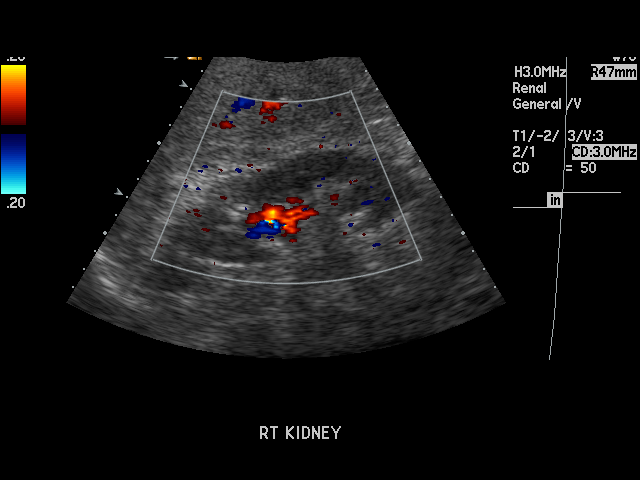
[im 13/33]
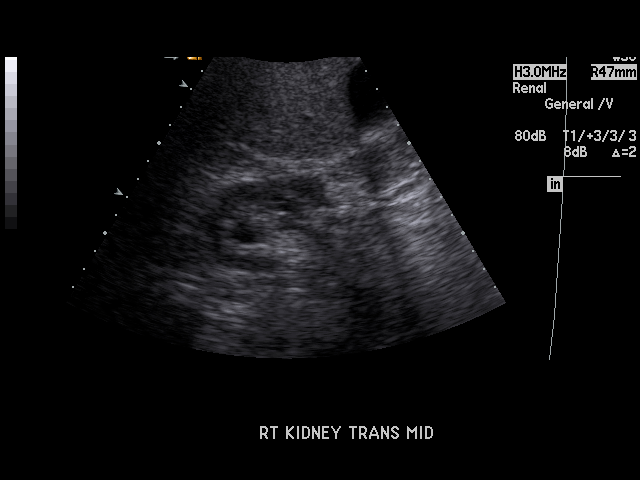
[im 15/33]
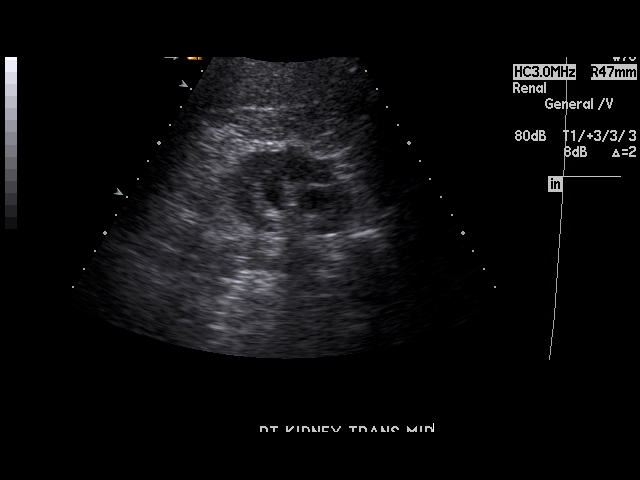
[im 17/33]
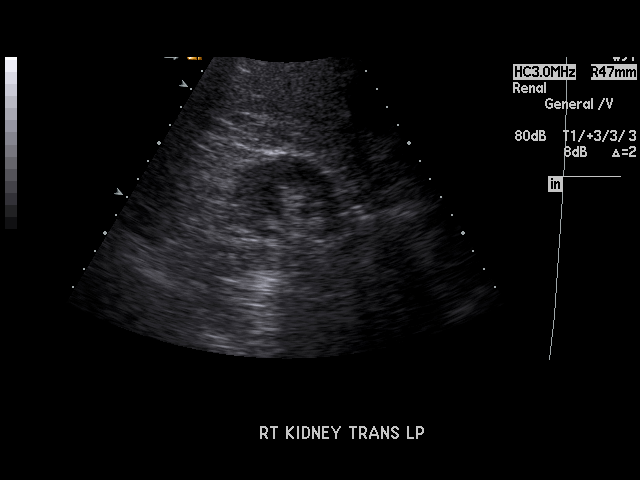
[im 18/33]
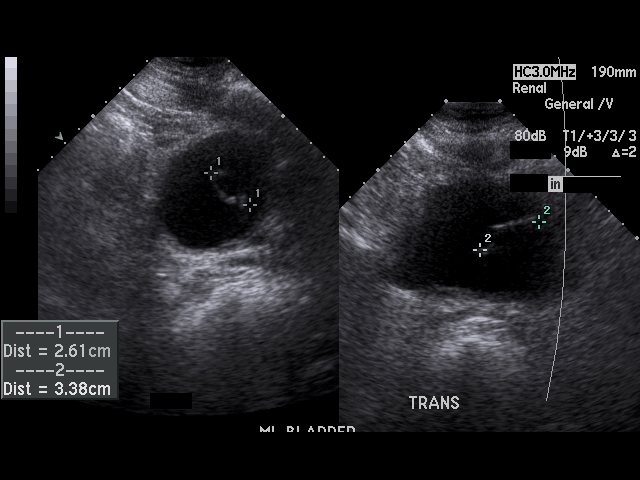
[im 21/33]
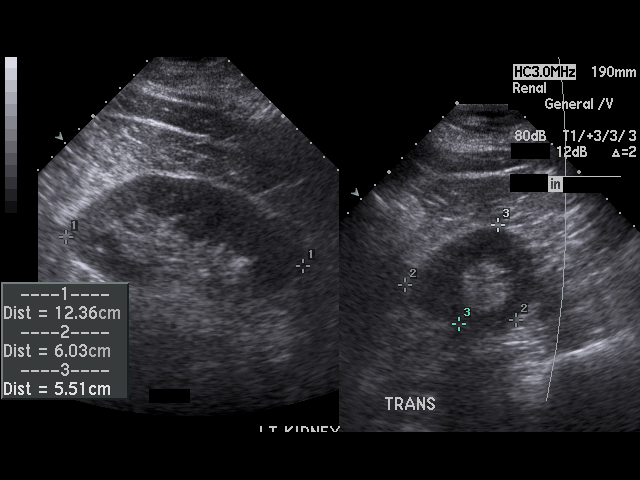
[im 22/33]
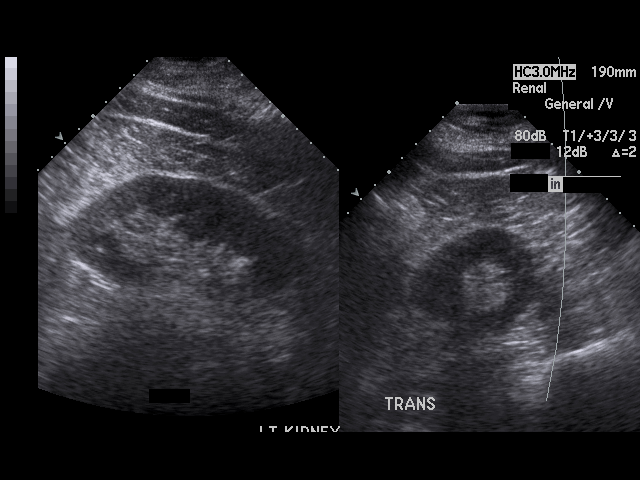
[im 25/33]
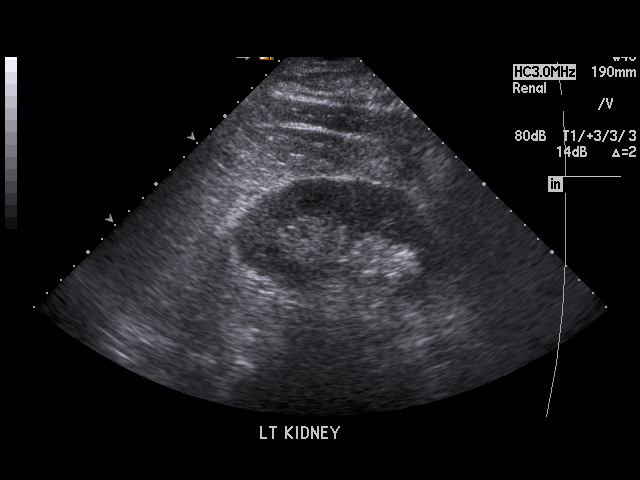
[im 26/33]
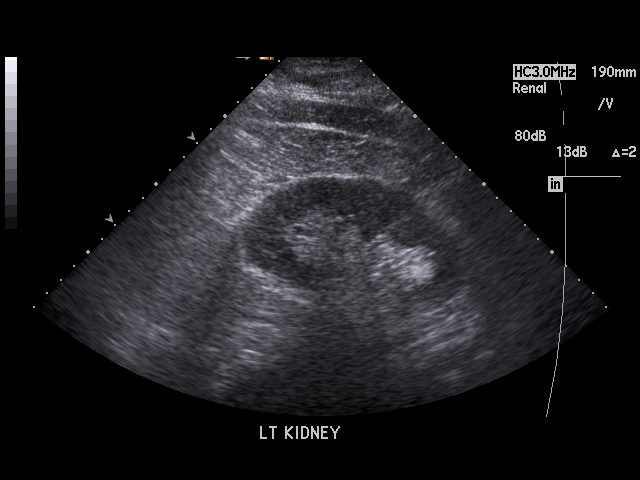
[im 29/33]
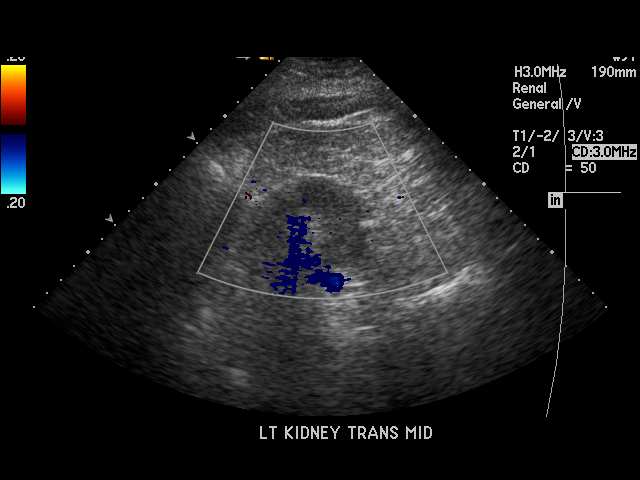
[im 30/33]
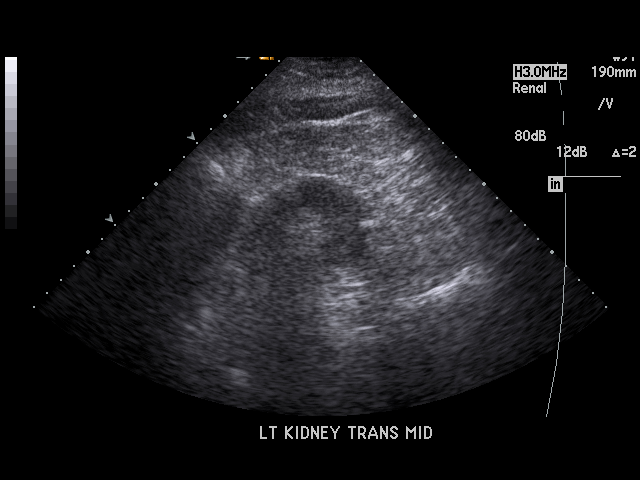
[im 33/33]
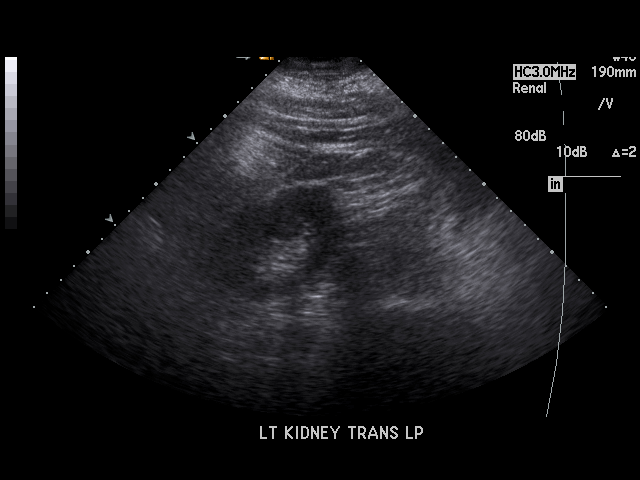

[17 of 25 positions shown; findings below may reference images not displayed]

PROCEDURE:     US  - US KIDNEY  - [DATE]  [DATE]

RESULT:     The right kidney measures 11.1 cm and the left 12.4 cm. Minimal
prominence of the right renal collecting system is noted. No prominent
hydronephrosis is noted. Minimal hydronephrosis on the right cannot be
excluded. Bladder wall thickening and/or mass lesion cannot be excluded.
IMPRESSION: Cannot exclude minimal right hydronephrosis. There is mild caliectasis on
the right. No evidence of prominent hydronephrosis. The left kidney is
unremarkable.

## 2009-05-15 IMAGING — CT CT ABD-PELV W/O CM
1 of 2 series · 14 of 32 positions shown, 18 images · non-contrast
Comparison: none

REASON FOR EXAM: (1) lowera bdominal pain, rectal bleed, h/o bladder
cancer; (2) lower abdominal
COMMENTS:

[Series 3: abdomen · axial · 0.85mm/px · z∈[-594,-154]mm · 14 of 98 slices shown, 18 images]
[im 5/98  soft-tissue]
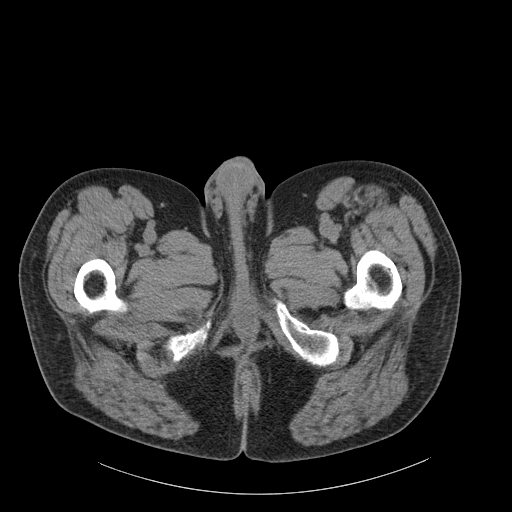
[im 5/98  bone]
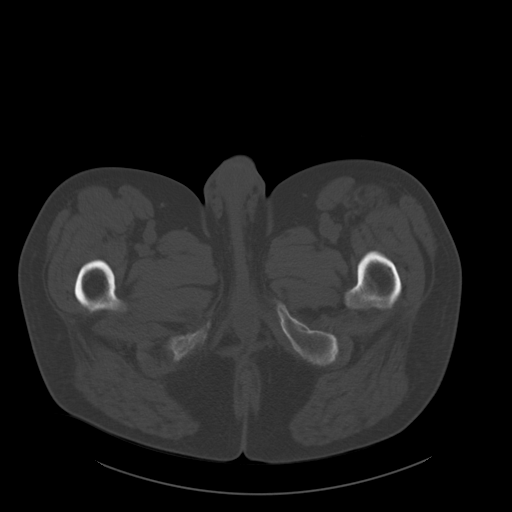
[im 13/98  soft-tissue]
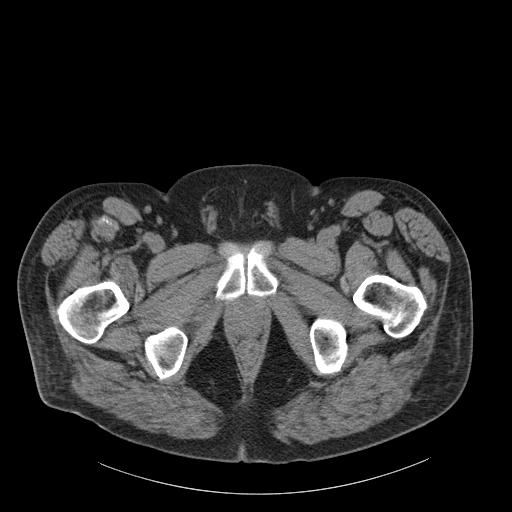
[im 21/98  soft-tissue]
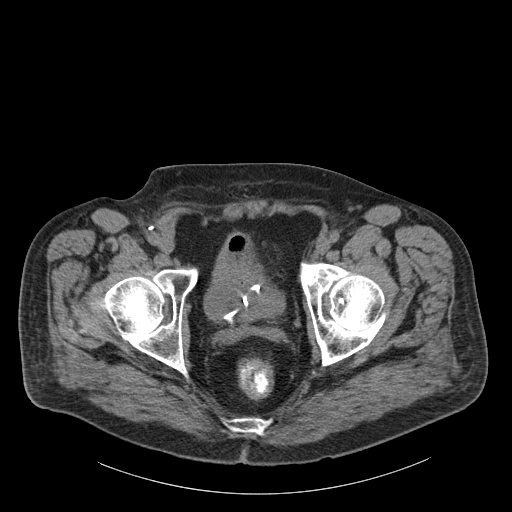
[im 29/98  soft-tissue]
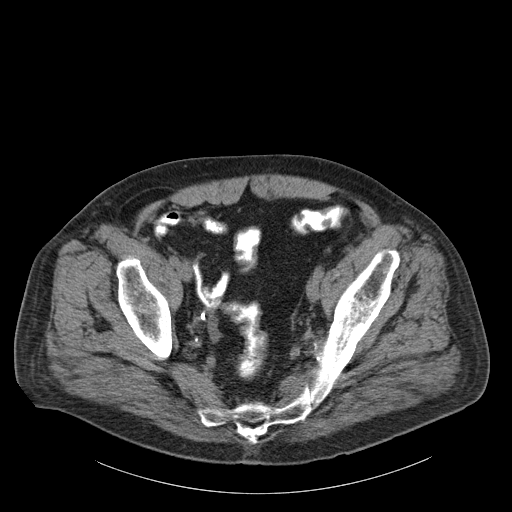
[im 37/98  soft-tissue]
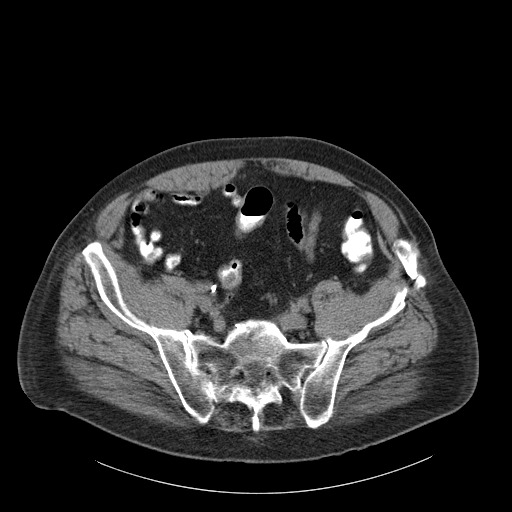
[im 45/98  soft-tissue]
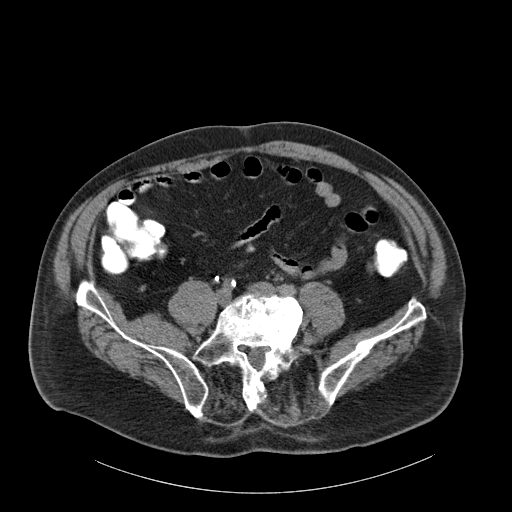
[im 53/98  soft-tissue]
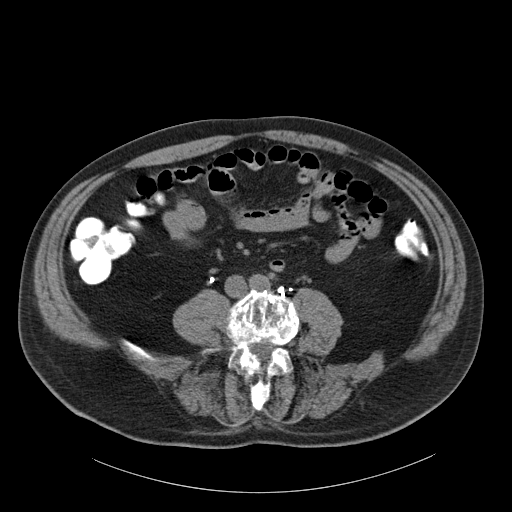
[im 61/98  soft-tissue]
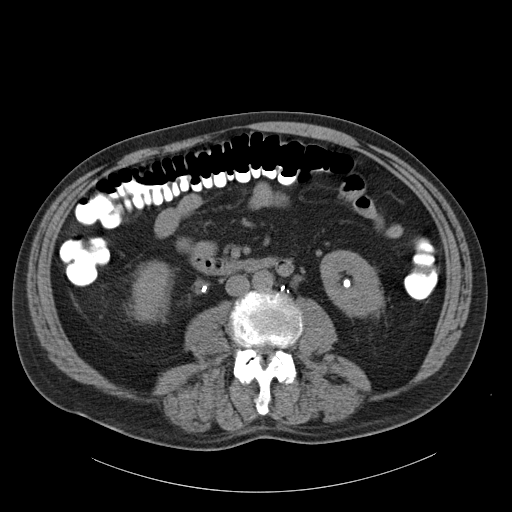
[im 69/98  soft-tissue]
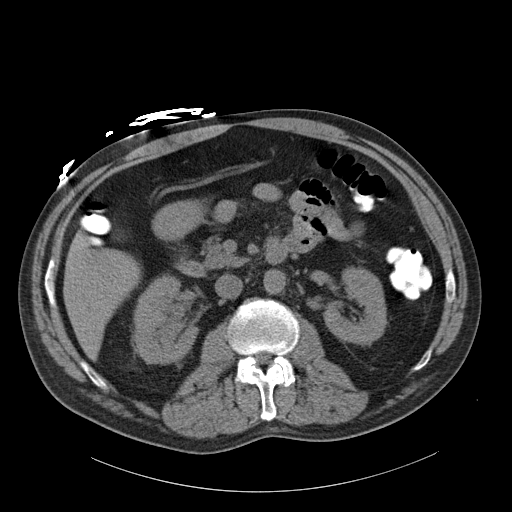
[im 69/98  bone]
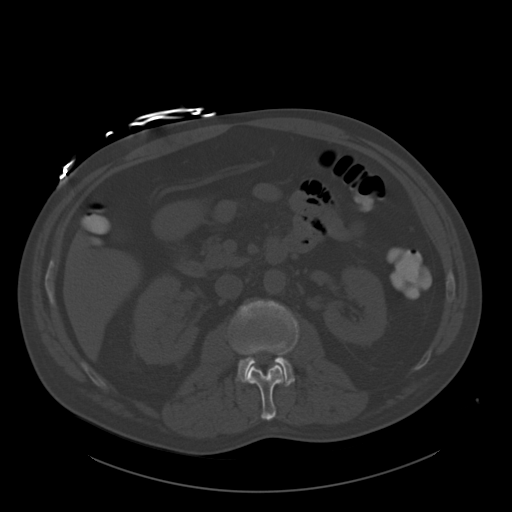
[im 77/98  soft-tissue]
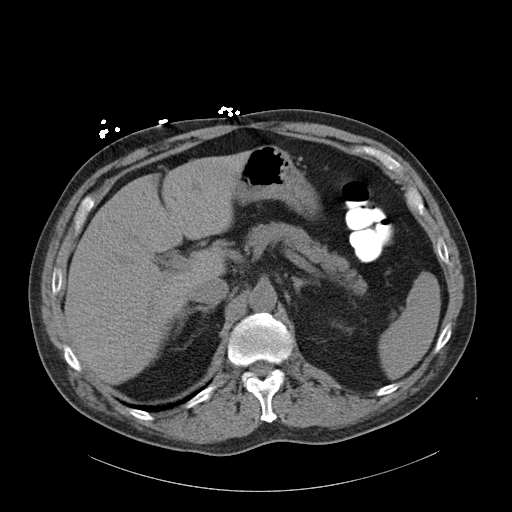
[im 81/98  lung]
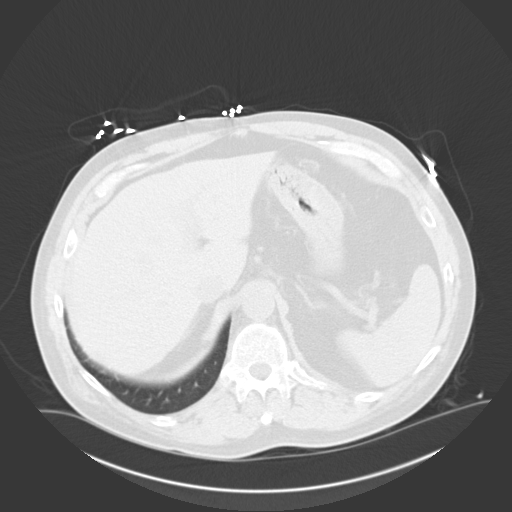
[im 85/98  soft-tissue]
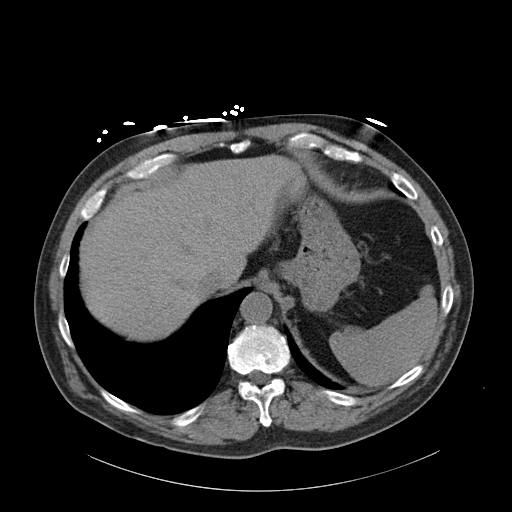
[im 85/98  lung]
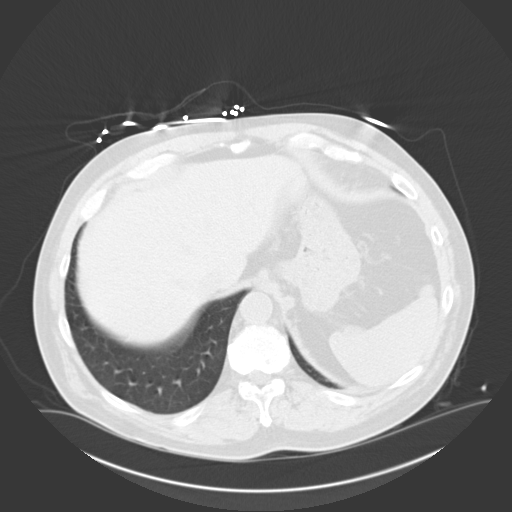
[im 89/98  lung]
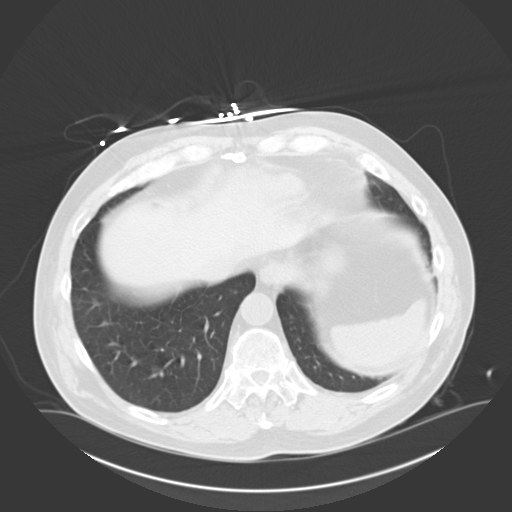
[im 93/98  soft-tissue]
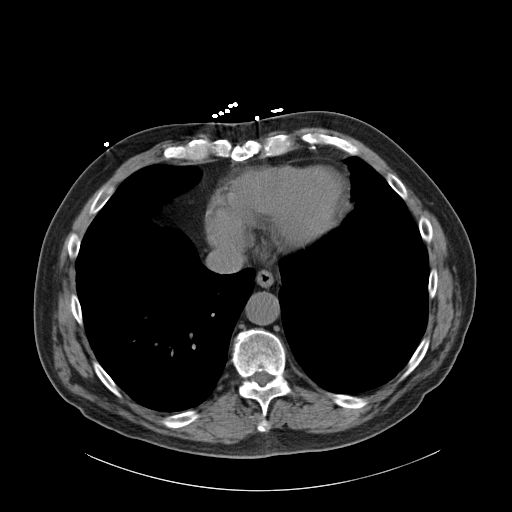
[im 93/98  lung]
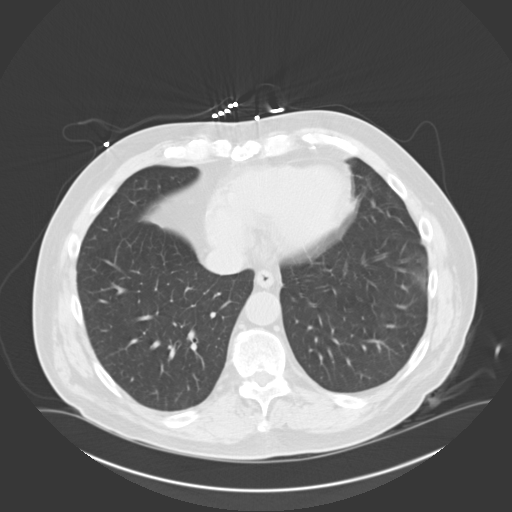

[14 of 32 positions shown; findings below may reference images not displayed]

PROCEDURE:     CT  - CT ABDOMEN AND PELVIS W[DATE] [DATE]

RESULT:     Axial noncontrast CT scanning was performed through the abdomen
and pelvis at 5 mm intervals and slice thicknesses. Comparison is made to a
contrast enhanced study [DATE]. Review of 3-dimensional
reconstructed images was performed separately on the [employer] monitor.

The liver, gallbladder, spleen, nondistended stomach, pancreas, and adrenal
glands are normal in appearance. There is mild hydronephrosis on the right
and there is a double-J ureteral stent in place on the right. The left
kidney exhibits no hydronephrosis. There is a nonobstructing stone measuring
6 mm in diameter in the lower pole. The caliber of the abdominal aorta is
normal.

The orally administered contrast has traversed the small bowel and reached
the colon. There is no evidence of a bowel obstruction. There are a few
scattered diverticula visible but I do not see objective evidence of acute
diverticulitis. The prostate gland is normal in appearance. There is gas
within the urinary bladder which may be related to recent instrumentation or
possibly due to infection with gas-forming organisms. There is no free fluid
in the pelvis. There is a small right inguinal hernia containing only fat.
There is degenerative change of the lumbar spine especially at the L4-L5
disc. I see no compression fracture. The lung bases are clear.
IMPRESSION: 1. The noncontrast study is limited in terms of evaluation of the urinary
bladder. There is a double-J ureteral stent in place on the right and there
is mild hydronephrosis on the right. A nonobstructing stone in the lower
pole of the left kidney is present. The prostate gland and seminal vesicles
are grossly normal.
2. There is no objective evidence of acute diverticulitis or acute colitis.
The sigmoid colon is incompletely distended in the possibility of low-grade
proctitis or diverticulitis cannot be entirely excluded. There is no
evidence of bowel obstruction.
3. I do not see evidence of acute hepatobiliary abnormality.

## 2009-06-28 ENCOUNTER — Emergency Department: Payer: Self-pay | Admitting: Emergency Medicine

## 2009-06-28 IMAGING — CT CT ABD-PELV W/O CM
1 of 2 series · 15 of 32 positions shown, 19 images · non-contrast
Comparison: [DATE], [DATE]

REASON FOR EXAM: (1) recent diagnosis of bladder cancer and UTI now with
severe back pain: histor
COMMENTS:   LMP: (Male)

PROCEDURE:     CT  - CT ABDOMEN AND PELVIS W[DATE]  [DATE]
RESULT:     Indication: Bladder cancer
TECHNIQUE: Multiple axial images from the lung bases to the symphysis pubis
were obtained without oral or intravenous contrast.

[Series 2: soft tissue · axial · 0.80mm/px · z∈[-852,-392]mm · 15 of 169 slices shown, 19 images]
[im 8/169  soft-tissue]
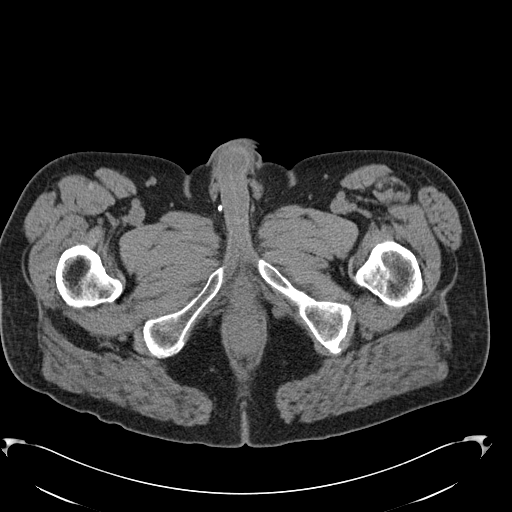
[im 8/169  bone]
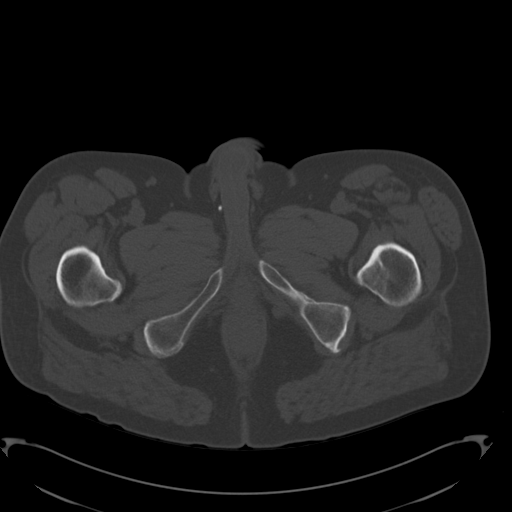
[im 22/169  soft-tissue]
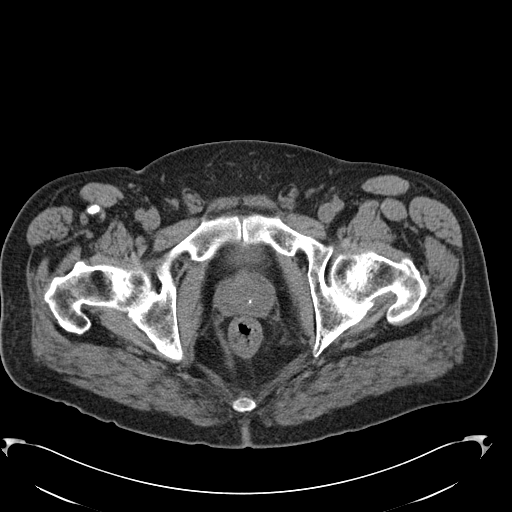
[im 37/169  soft-tissue]
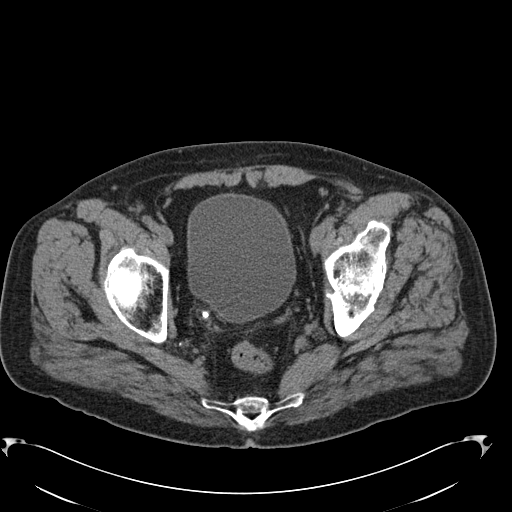
[im 44/169  soft-tissue]
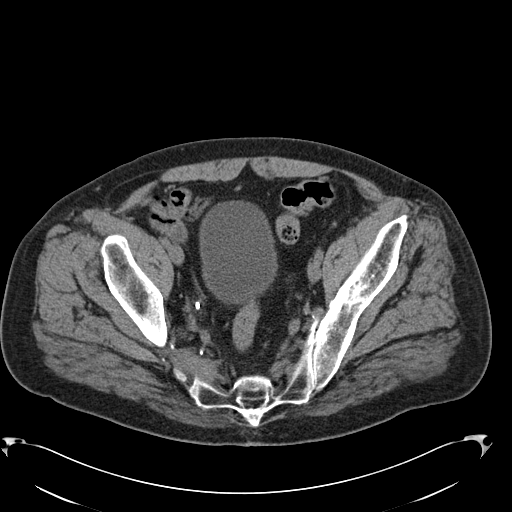
[im 59/169  soft-tissue]
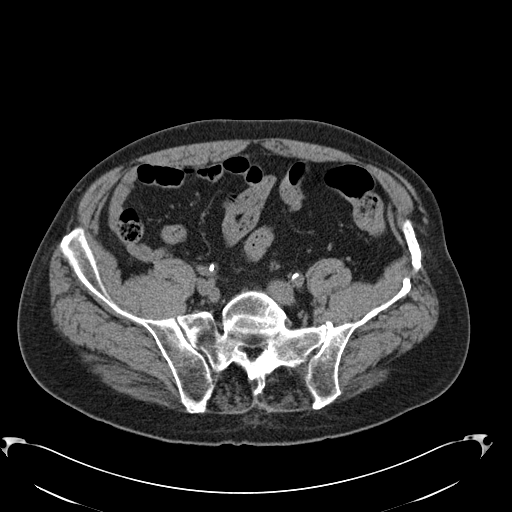
[im 74/169  soft-tissue]
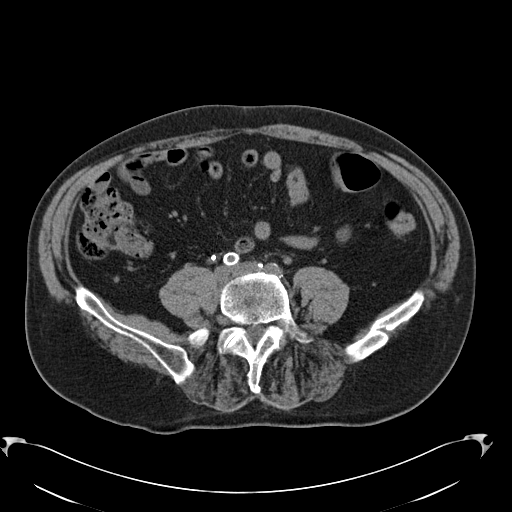
[im 88/169  soft-tissue]
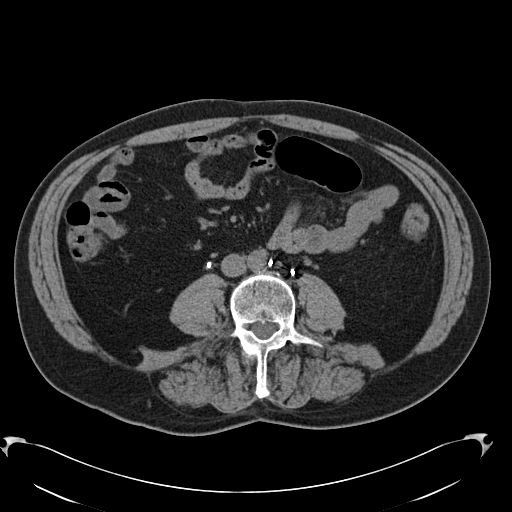
[im 95/169  soft-tissue]
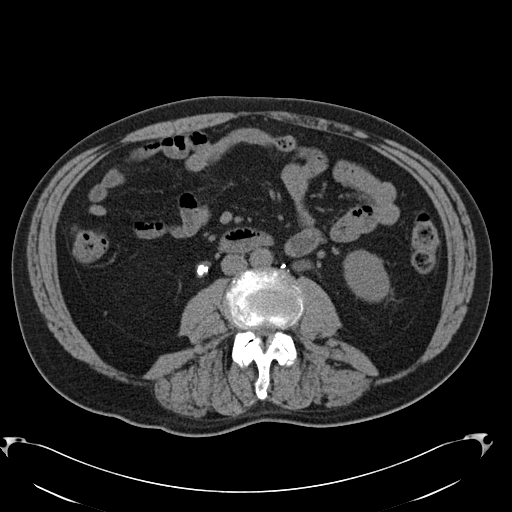
[im 110/169  soft-tissue]
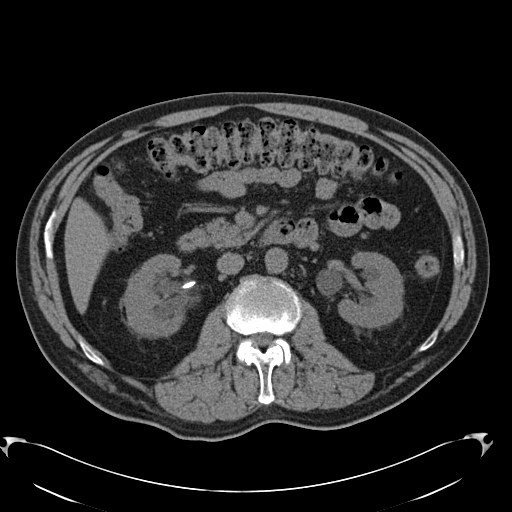
[im 110/169  bone]
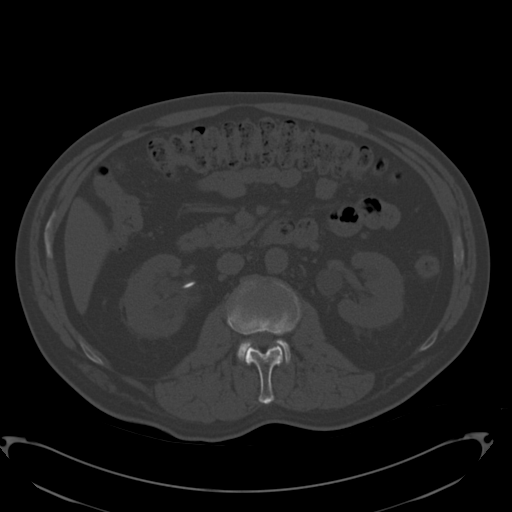
[im 125/169  soft-tissue]
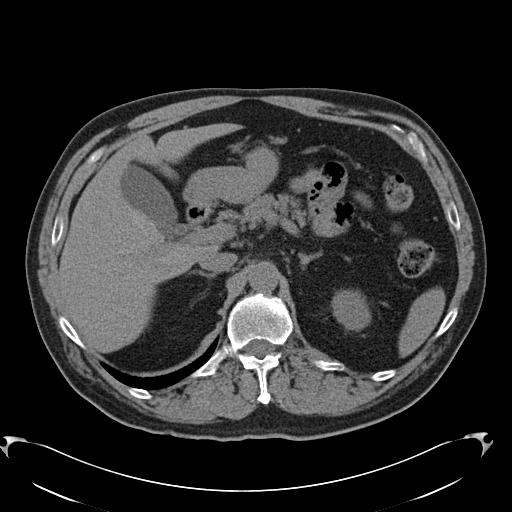
[im 132/169  soft-tissue]
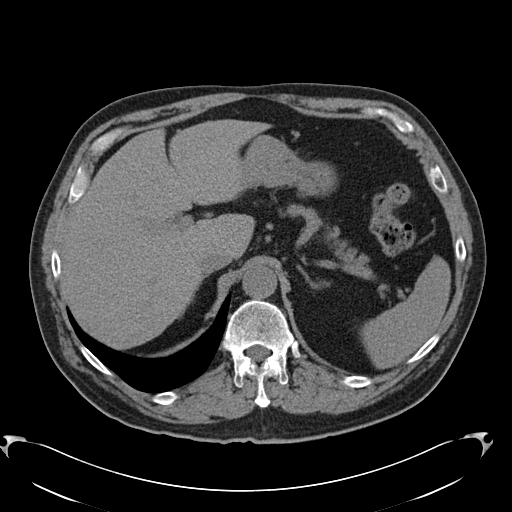
[im 139/169  lung]
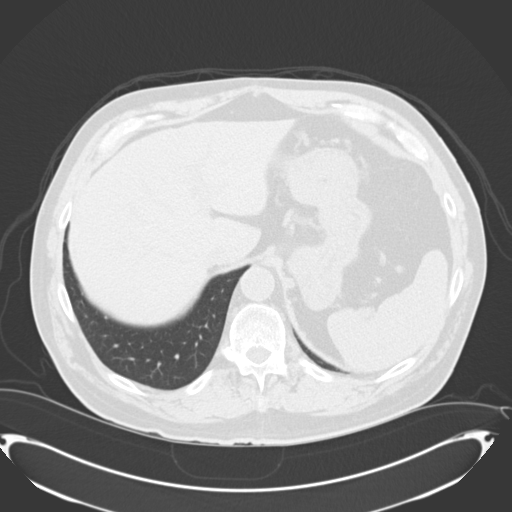
[im 147/169  soft-tissue]
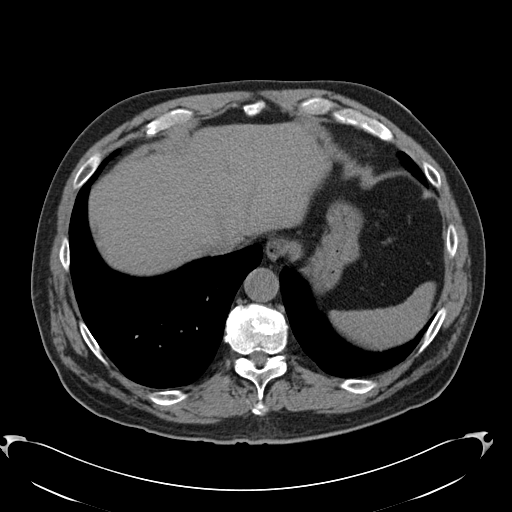
[im 147/169  lung]
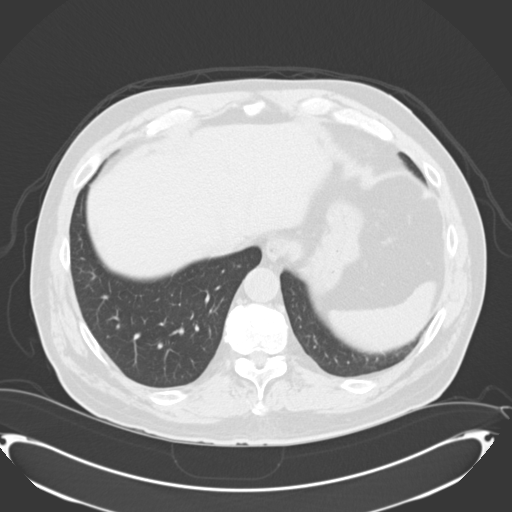
[im 154/169  lung]
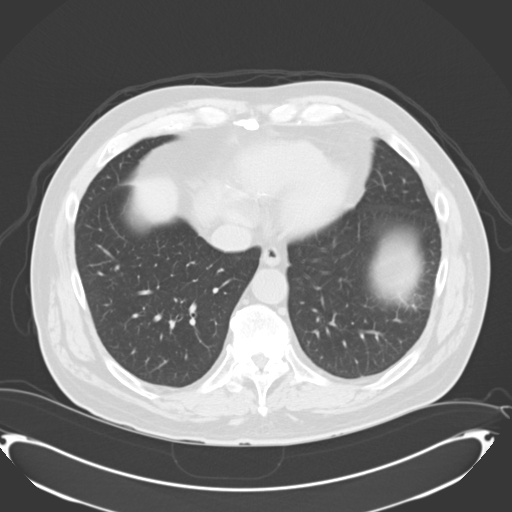
[im 161/169  soft-tissue]
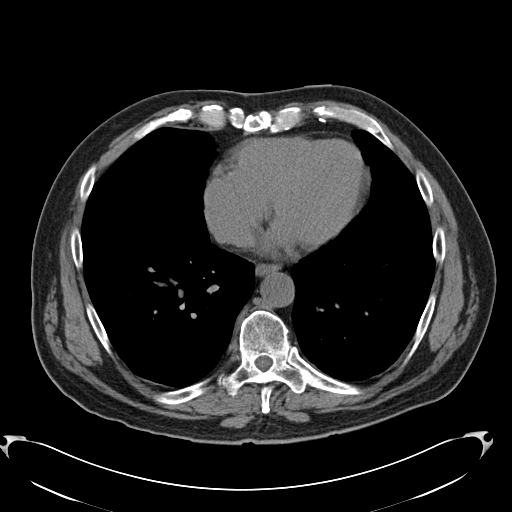
[im 161/169  lung]
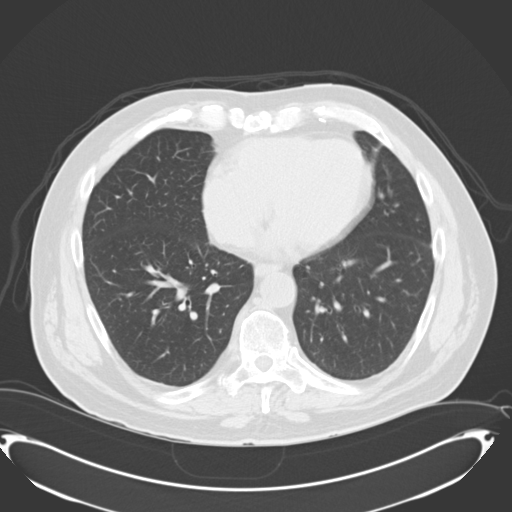

[15 of 32 positions shown; findings below may reference images not displayed]

FINDINGS: The lung bases are clear. There is no pleural or pericardial effusions.

There is moderate right hydronephrosis. There is a right nephroureteral
stent with the proximal portion coiled within the right renal pelvis and the
distal portion " within the bladder. There is mild left hydronephrosis.
There are multiple surgical clips in the retroperitoneum likely from prior
lymph node dissection. There is a 7 mm nonobstructing left renal calculus.
There is a 7 mm right proximal ureteral calculus.

The liver demonstrates no focal abnormality. The gallbladder is
unremarkable. The spleen demonstrates no focal abnormality. The adrenal
glands and pancreas are normal.

The unopacified stomach, duodenum, small intestine, and large intestine are
unremarkable, but evaluation is limited by lack of oral contrast. There is
no pneumoperitoneum, pneumatosis, or portal venous gas. There is no
abdominal or pelvic free fluid. There is no lymphadenopathy.

The abdominal aorta is normal in caliber with atherosclerosis. There is a
right common iliac artery stent.

There is posttraumatic deformity of the left ilium. There is lumbar spine
spondylosis. There is severe degenerative disc disease at L3-L4, L4-L5 and
L5-S1 with severe disc base narrowing. There is bilateral severe foraminal
narrowing bilaterally at L4-L5 and L5-S1.
IMPRESSION: Moderate right hydronephrosis. Right nephroureteral stent in place. The
degree of hydronephrosis appears to have increased compared with the prior
examination. There is a 7 mm right proximal ureteral calculus.

## 2009-07-05 ENCOUNTER — Emergency Department: Payer: Self-pay | Admitting: Emergency Medicine

## 2009-07-17 ENCOUNTER — Emergency Department: Payer: Self-pay | Admitting: Emergency Medicine

## 2009-07-17 IMAGING — CR DG CHEST 2V
1 series · 2 of 2 positions shown · non-contrast
Comparison: none

REASON FOR EXAM: cough fever
COMMENTS:

PROCEDURE:     DXR - DXR CHEST PA (OR AP) AND LATERAL  - [DATE] [DATE]
RESULT:     Comparison is made to the exam of [DATE].
Sternotomy wires and CABG markers are present. Emphysematous lung disease is
present. There is no infiltrate, edema, effusion or failure.

[Series 1: view not recorded · 0.17mm/px · 2 of 2 slices shown]
[im 1/2]
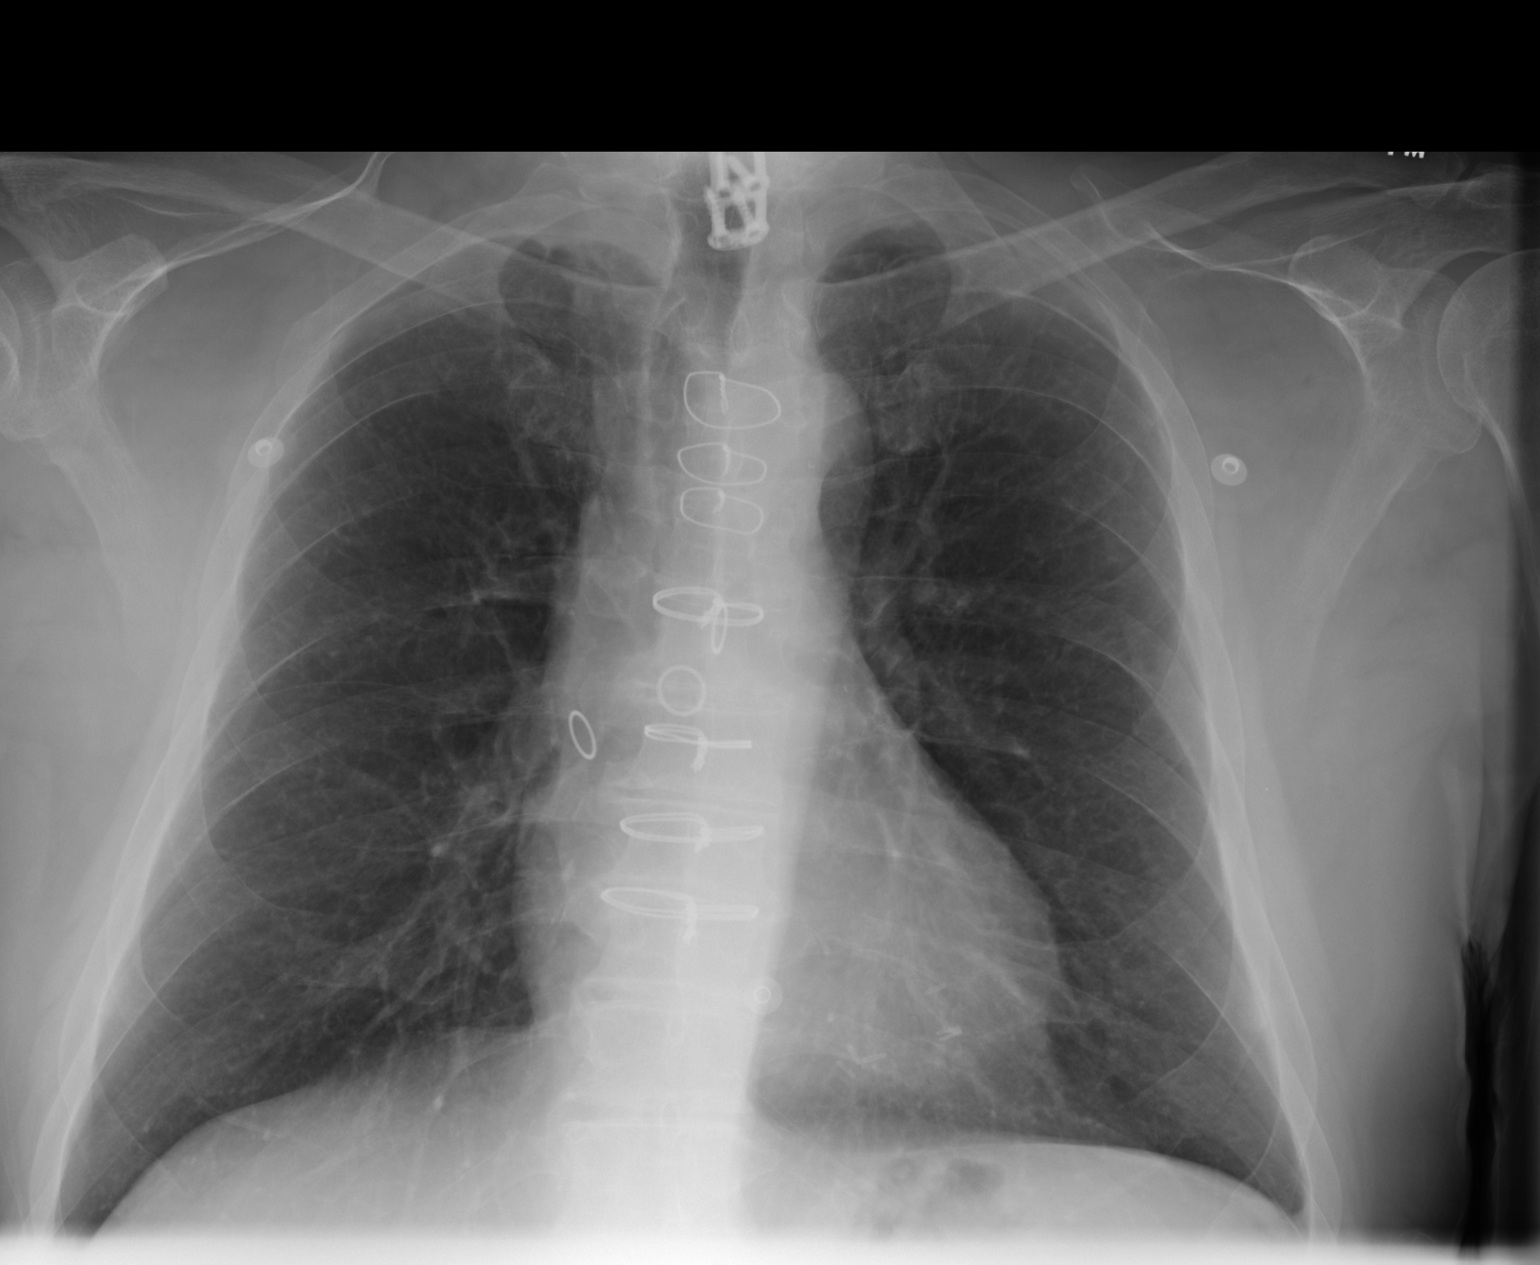
[im 2/2]
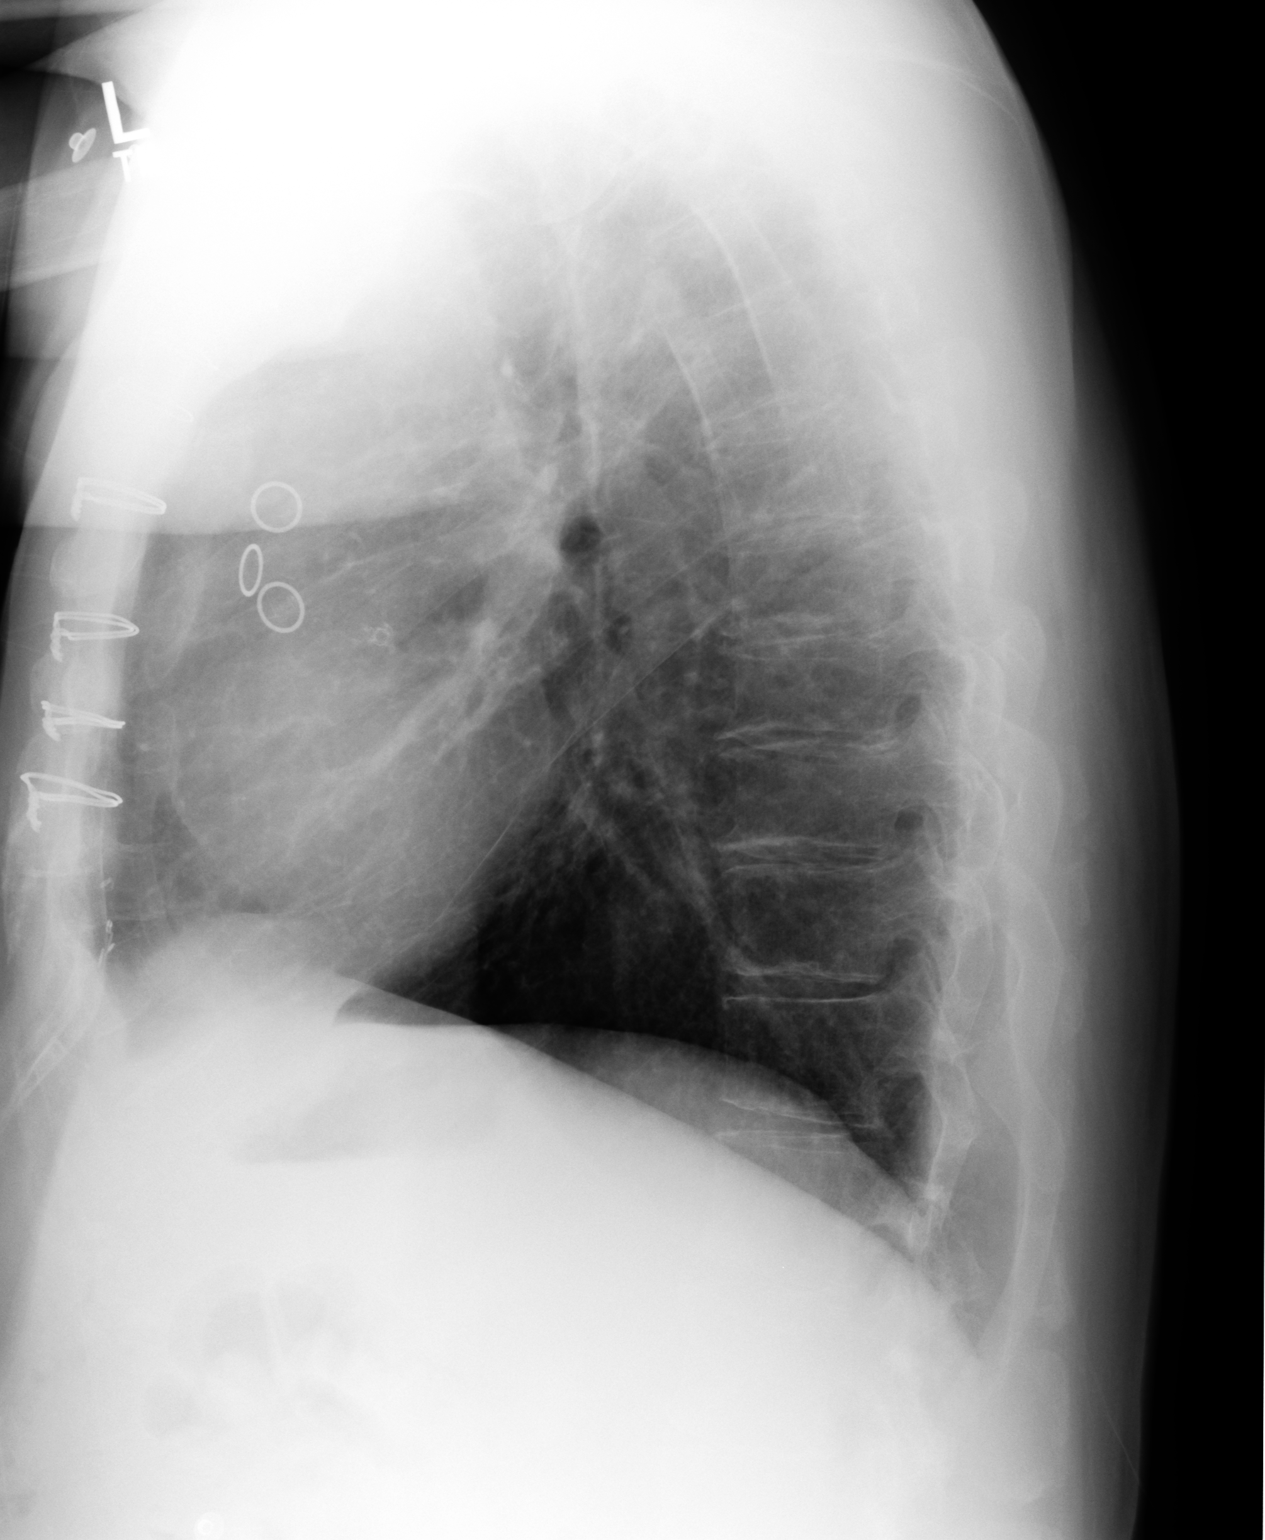

[2 of 2 positions shown; findings below may reference images not displayed]

IMPRESSION: Emphysematous lung disease with CABG changes. No acute
abnormality evident.

## 2010-04-12 ENCOUNTER — Inpatient Hospital Stay: Payer: Self-pay | Admitting: Internal Medicine

## 2010-04-13 IMAGING — US ABDOMEN ULTRASOUND
1 series · 17 of 25 positions shown · non-contrast
Comparison: none

REASON FOR EXAM: abd pain
COMMENTS:

PROCEDURE:     US  - US ABDOMEN GENERAL SURVEY  - [DATE]  [DATE]
RESULT:     Comparison: None
TECHNIQUE: Multiple gray-scale and color-flow Doppler images of the abdomen
are presented for review.

[Series 1: abdomen ultrasound · 17 of 68 slices shown]
[im 1/68]
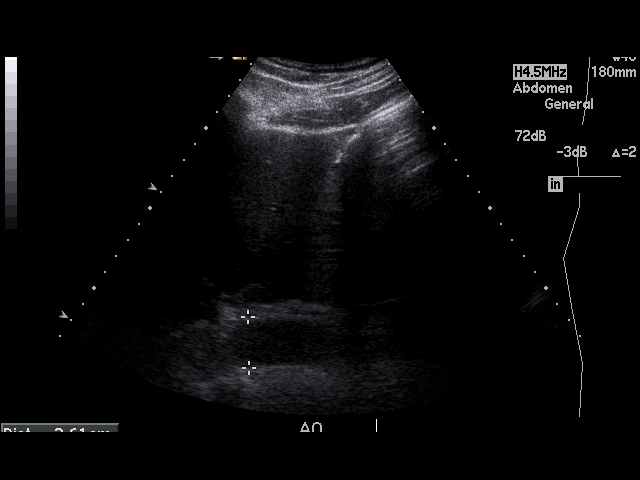
[im 6/68]
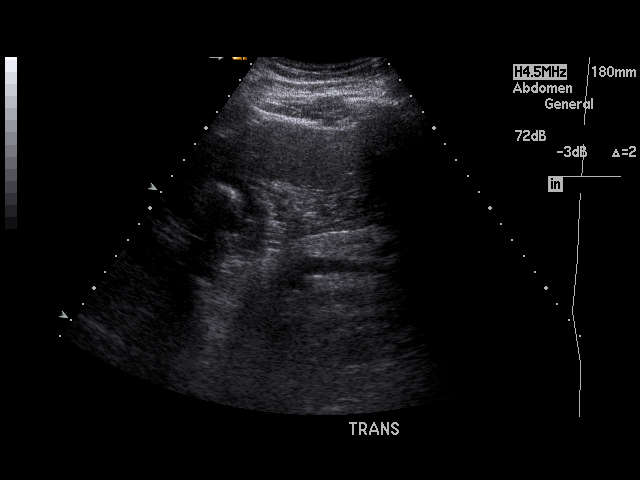
[im 9/68]
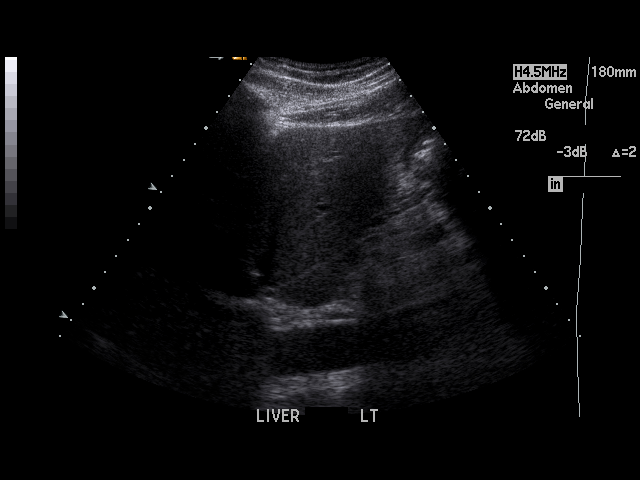
[im 14/68]
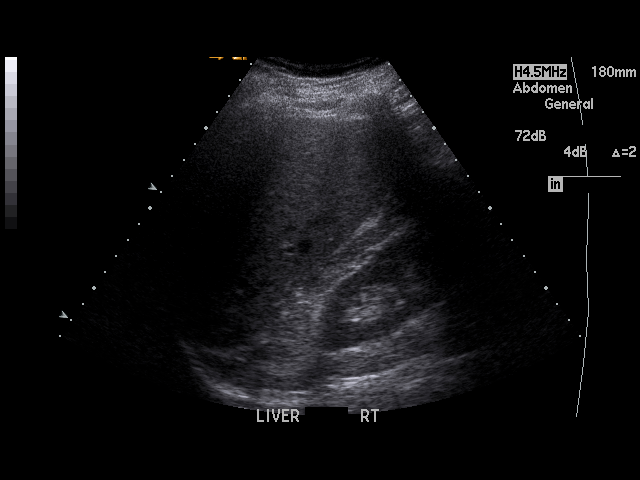
[im 17/68]
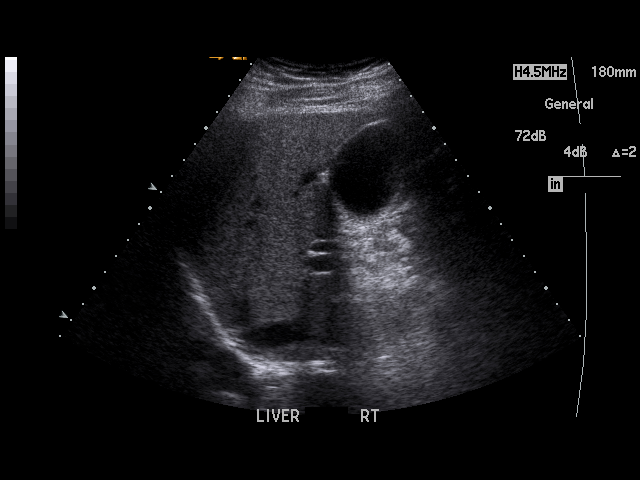
[im 23/68]
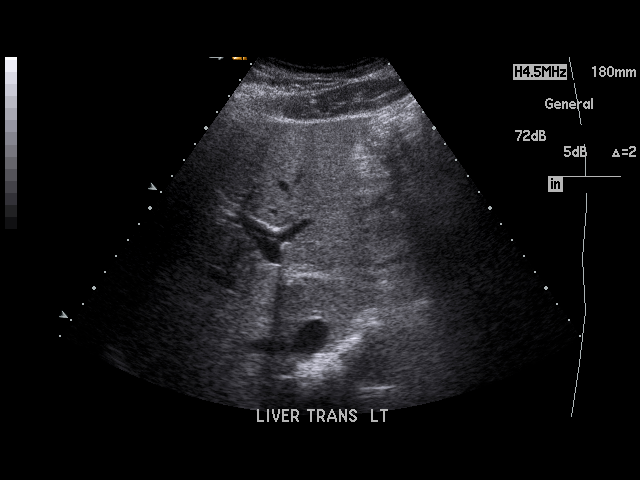
[im 26/68]
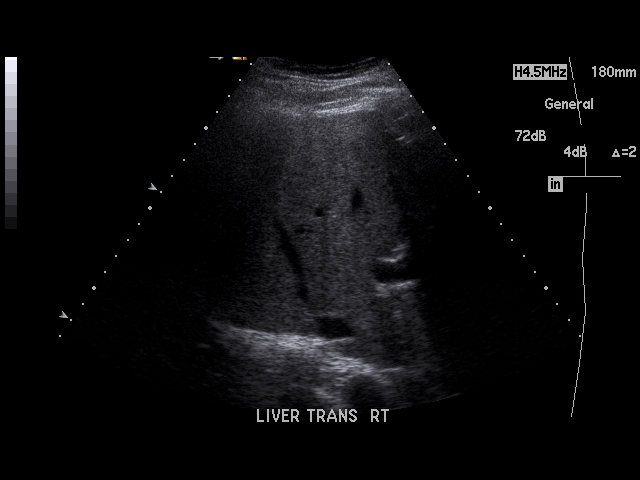
[im 31/68]
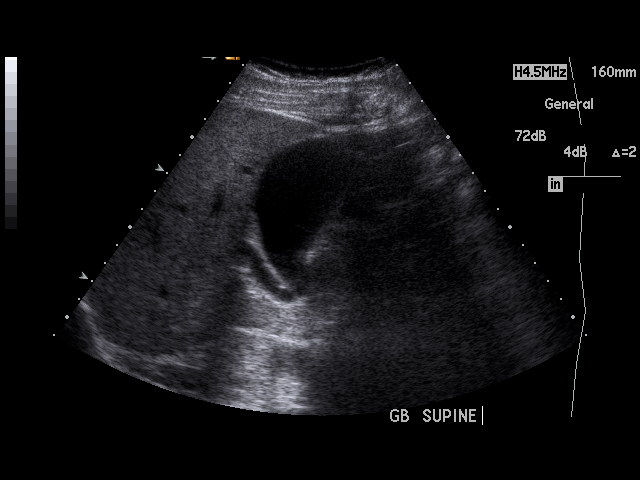
[im 34/68]
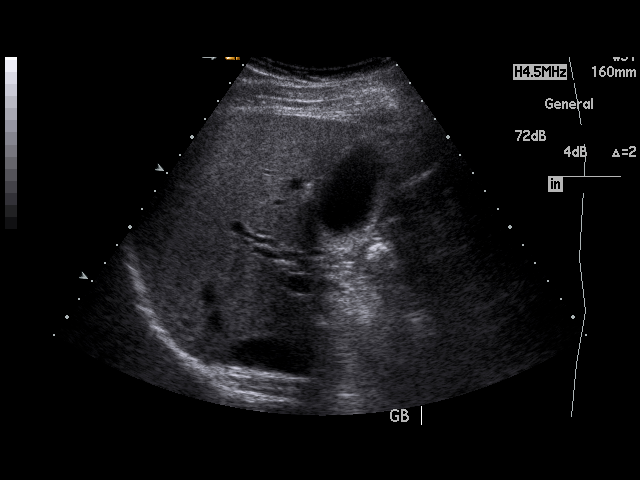
[im 37/68]
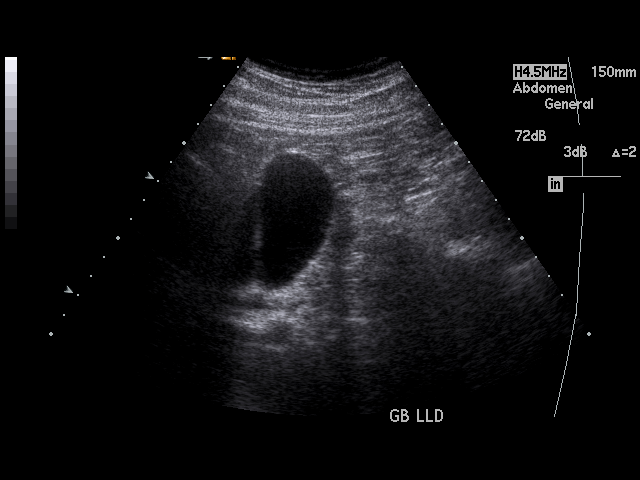
[im 42/68]
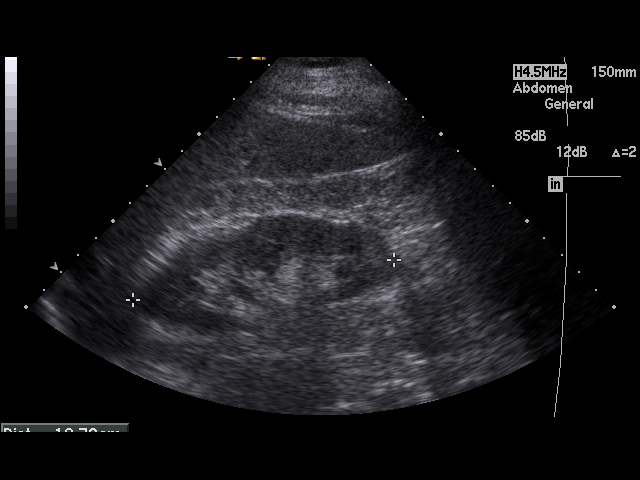
[im 45/68]
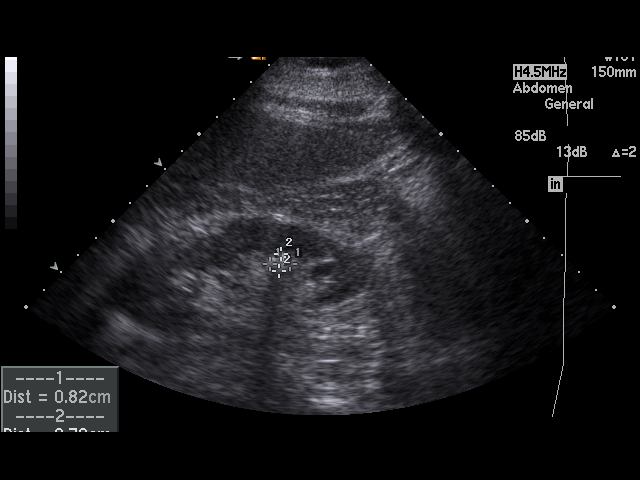
[im 51/68]
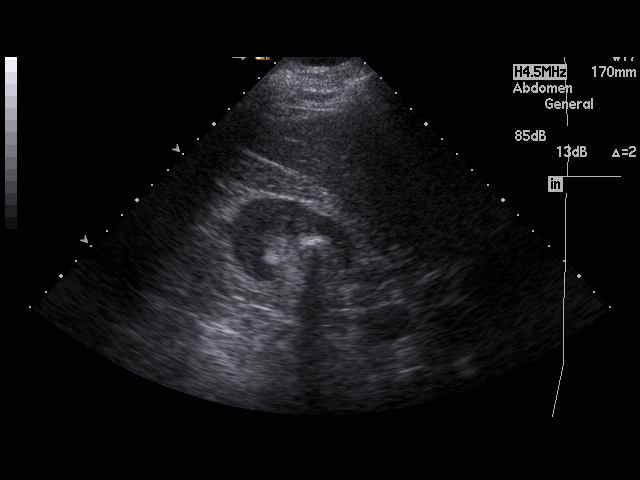
[im 54/68]
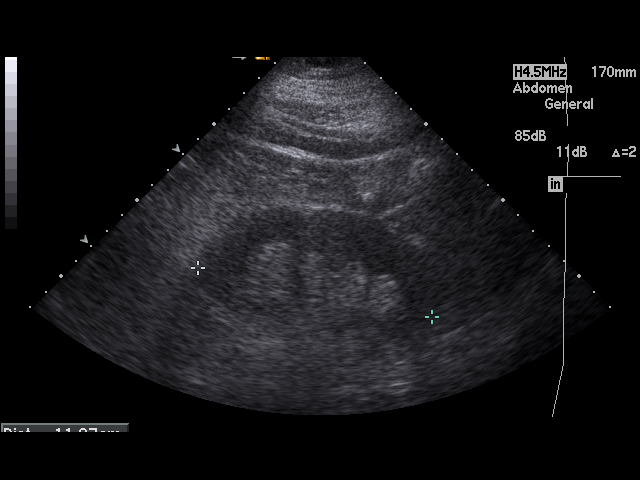
[im 59/68]
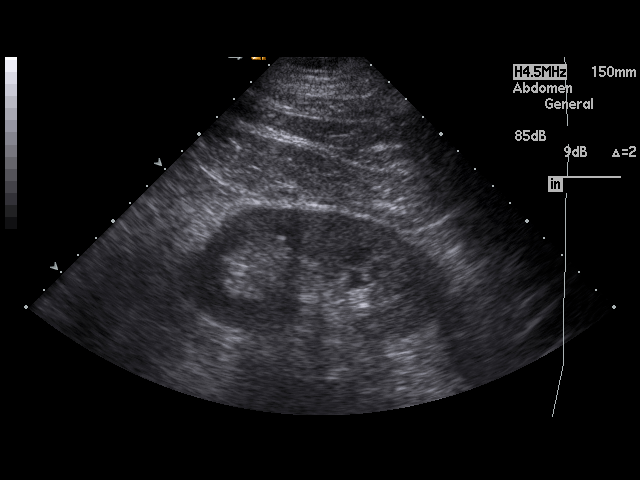
[im 62/68]
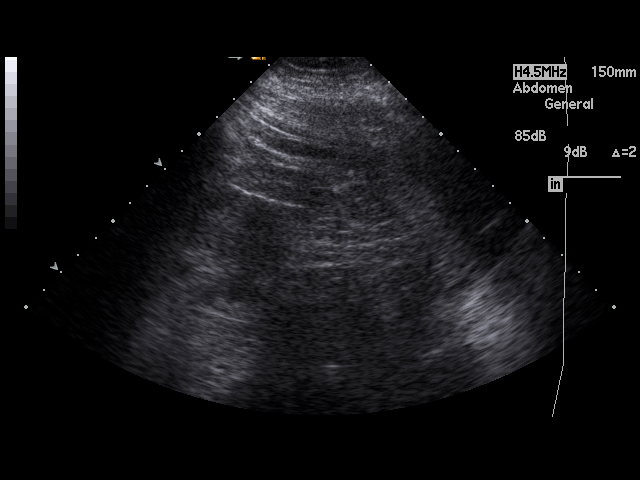
[im 68/68]
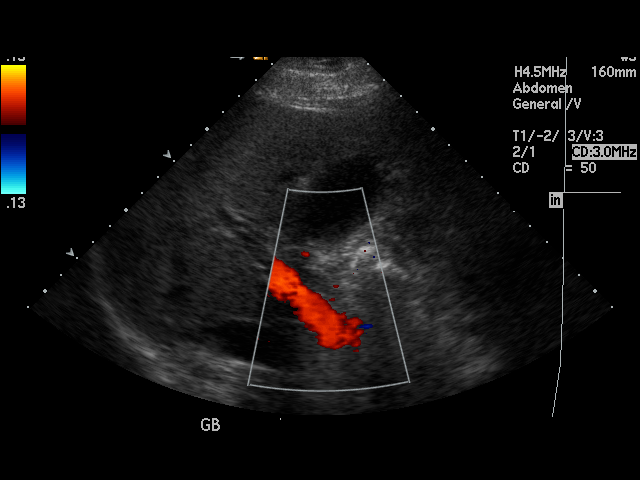

[17 of 25 positions shown; findings below may reference images not displayed]

FINDINGS: Visualized portions of the liver demonstrate normal echogenicity and normal
contours. The liver is without evidence of a focal hepatic lesion.

There is no cholelithiasis or biliary sludge. There is no intra- or
extrahepatic biliary ductal dilatation. The common duct measures 7 mm in
maximal diameter. There is no gallbladder wall thickening, pericholecystic
fluid, or sonographic Murphy's sign.

The visualized portion of the pancreas is normal in echogenicity. The spleen
is unremarkable. Bilateral kidneys are normal in echogenicity and size. The
right kidney measures 10.8 x 4.7 x 4.1 cm. The left kidney measures 11.1 x
4.1 x 3.8 cm. There is a nonobstructing renal calculus. The abdominal aorta
and IVC are unremarkable.
IMPRESSION: No cholelithiasis or sonographic evidence of acute cholecystitis.

## 2010-09-27 ENCOUNTER — Ambulatory Visit: Payer: Self-pay | Admitting: Family Medicine

## 2010-09-27 IMAGING — US US EXTREM LOW VENOUS*L*
1 series · 14 of 23 positions shown · non-contrast
Comparison: none

REASON FOR EXAM: CR [PHONE_NUMBER] ext [GQ]  pain swelling eval DVT
COMMENTS:

PROCEDURE:     US  - US DOPPLER LOW EXTR LEFT  - [DATE] [DATE]
RESULT:     The left femoral and popliteal veins are normally compressible.
The waveform patterns are normal and the color flow images are normal. The
response to the augmentation and Valsalva maneuvers is normal.

[Series 1: us extrem low venous*left* · 14 of 23 slices shown]
[im 1/23]
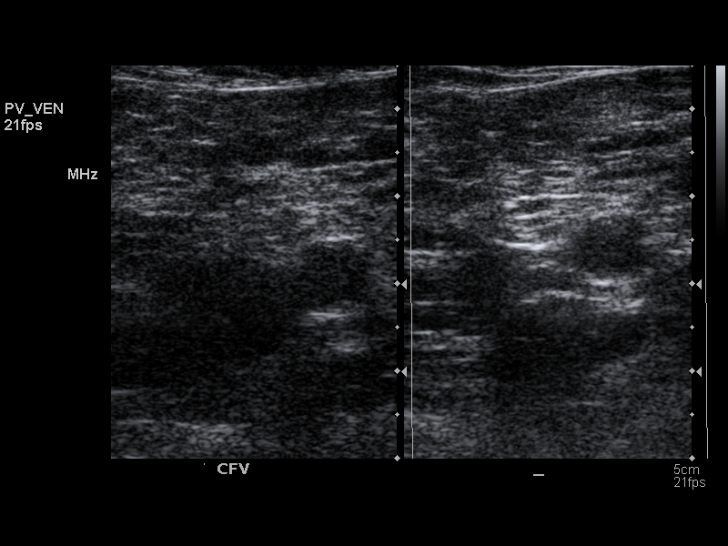
[im 3/23]
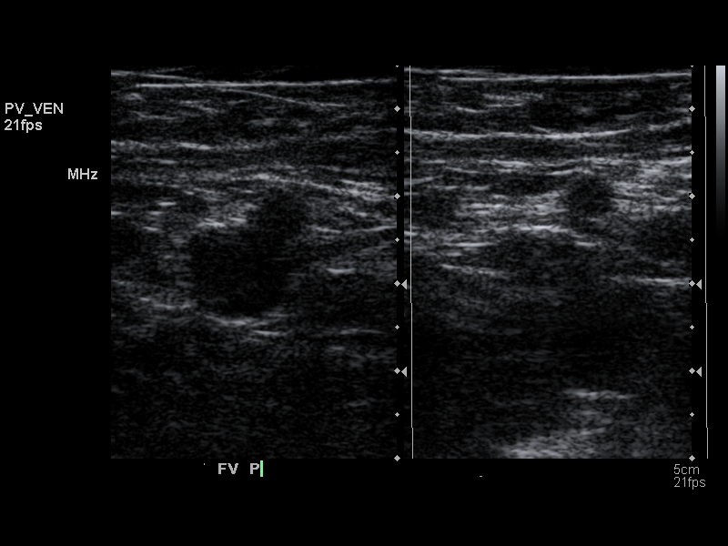
[im 5/23]
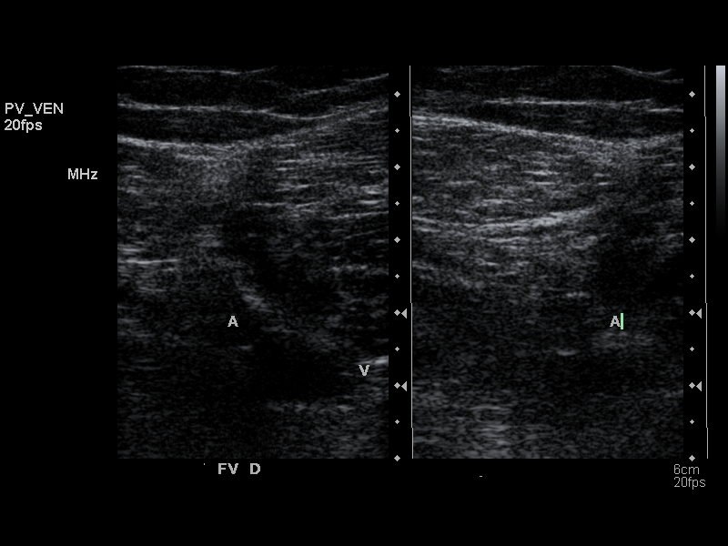
[im 6/23]
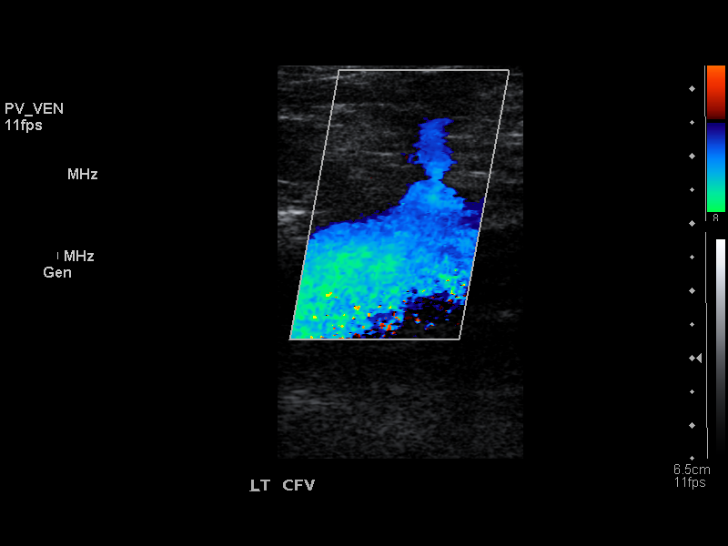
[im 8/23]
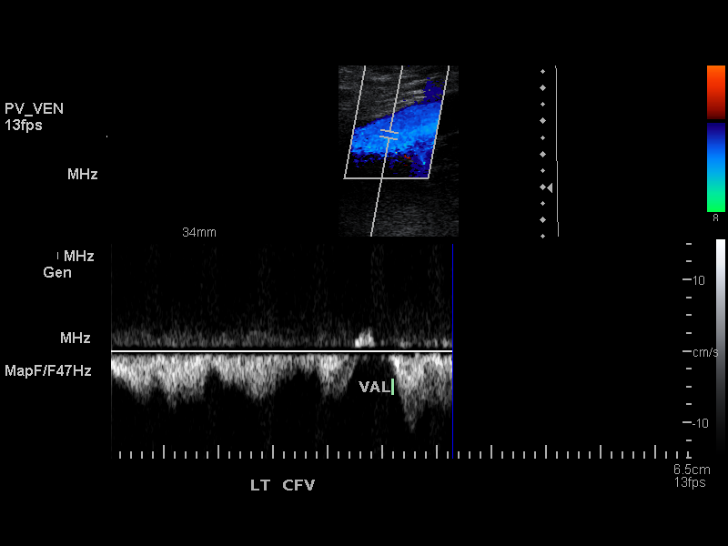
[im 10/23]
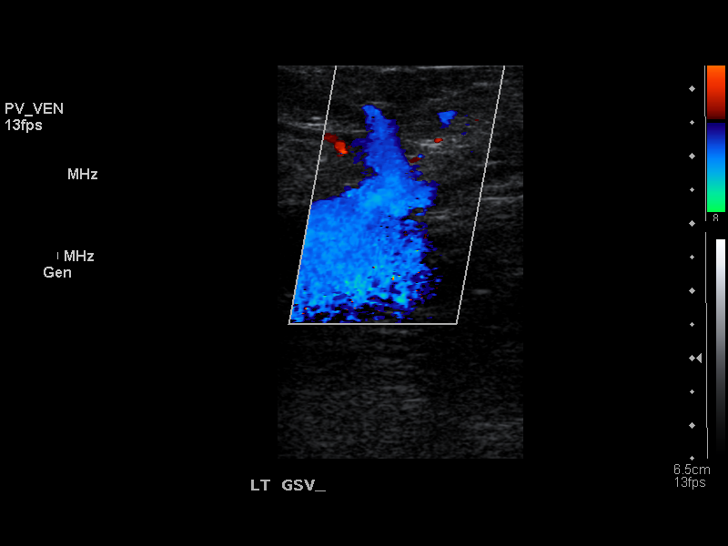
[im 11/23]
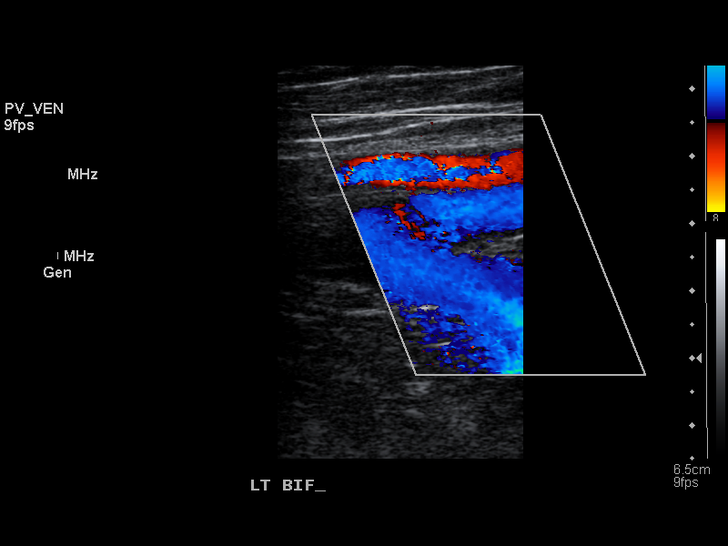
[im 13/23]
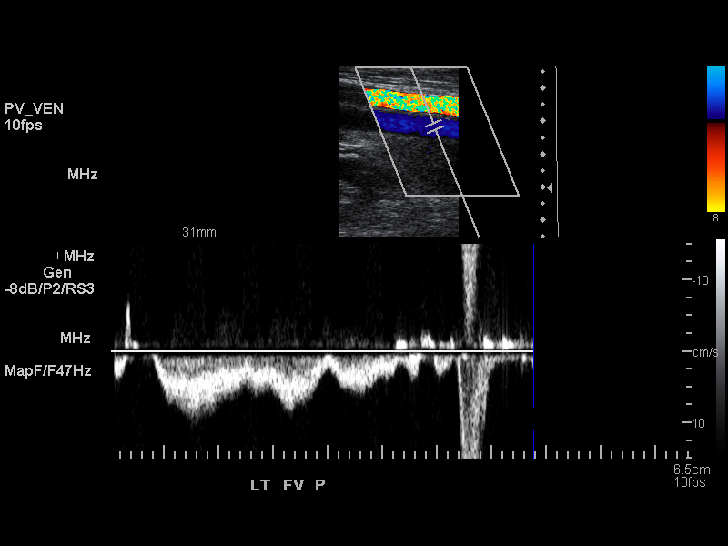
[im 14/23]
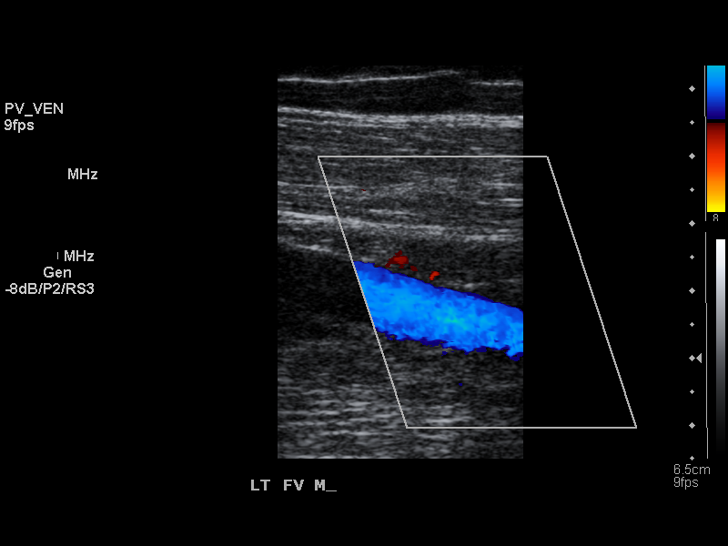
[im 16/23]
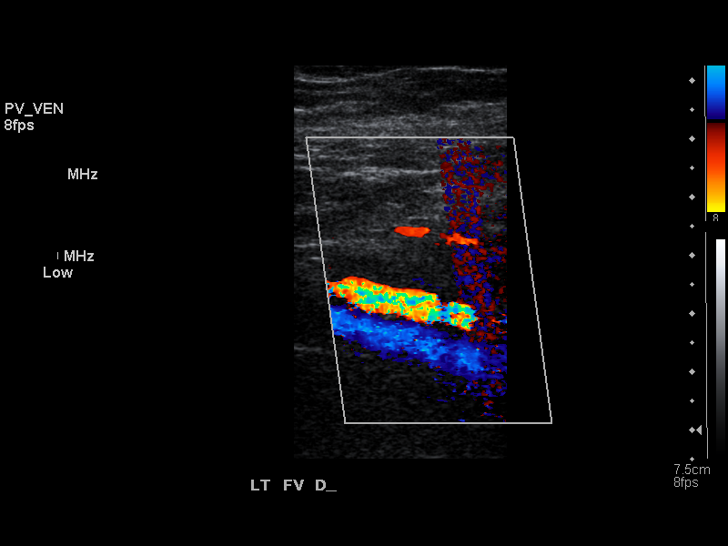
[im 18/23]
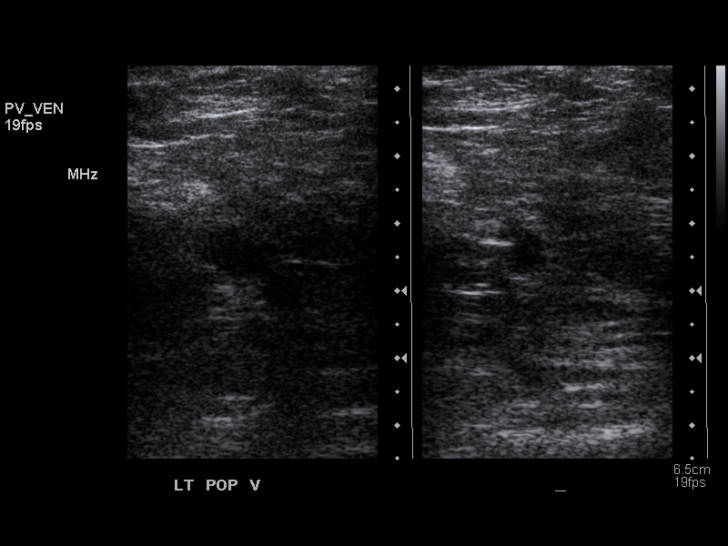
[im 19/23]
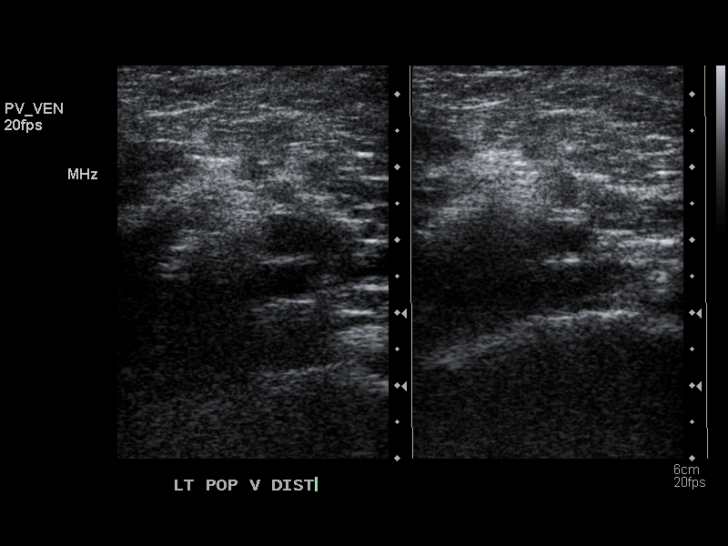
[im 21/23]
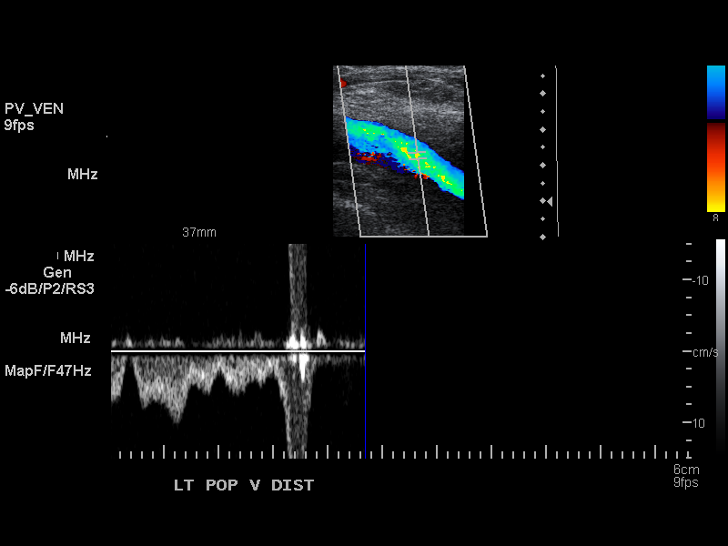
[im 23/23]
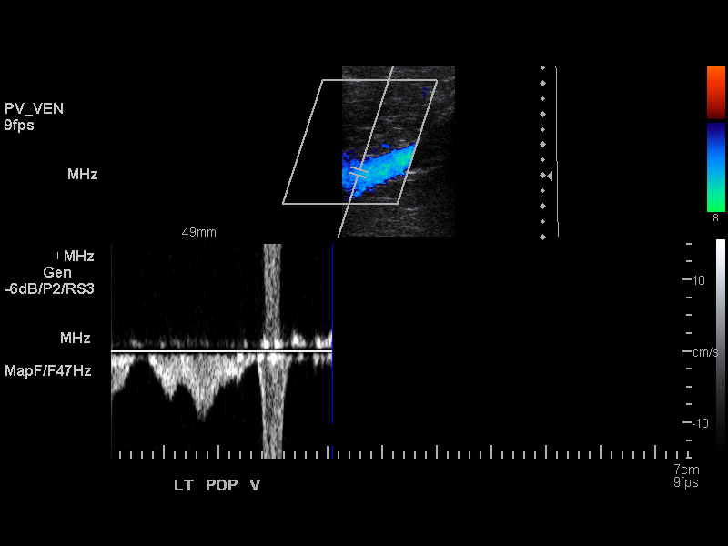

[14 of 23 positions shown; findings below may reference images not displayed]

IMPRESSION: I see no evidence of thrombus within the left femoral or
popliteal veins.

A preliminary report was called to Dr. [REDACTED] and the report
given to VER at [DATE] a.m. on [DATE].

## 2011-10-27 ENCOUNTER — Emergency Department: Payer: Self-pay | Admitting: Emergency Medicine

## 2011-10-27 IMAGING — CR DG KNEE COMPLETE 4+V*R*
1 series · 5 of 5 positions shown · non-contrast
Comparison: none

REASON FOR EXAM: fall
COMMENTS:

PROCEDURE:     DXR - DXR KNEE RT COMP WITH OBLIQUES  - [DATE]  [DATE]
RESULT:     Five views of the right knee are submitted. The bones are
osteopenic. I do not see evidence of an acute fracture nor dislocation. No
more than mild degenerative change is present. The proximal fibula appears
intact.

[Series 2: t knee obl right · 0.14mm/px · 5 of 5 slices shown]
[im 1/5]
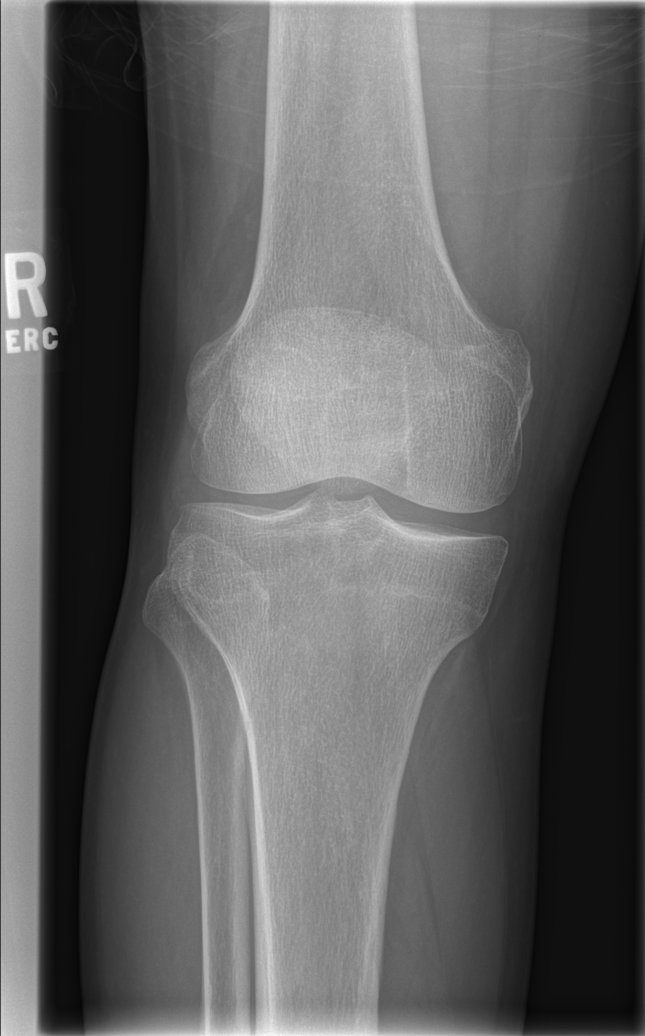
[im 2/5]
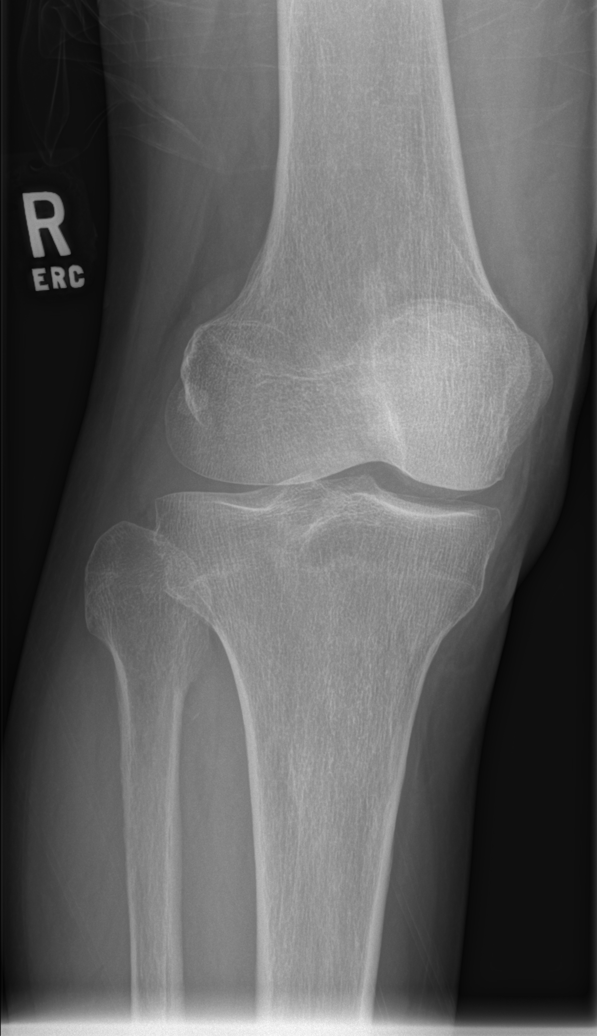
[im 3/5]
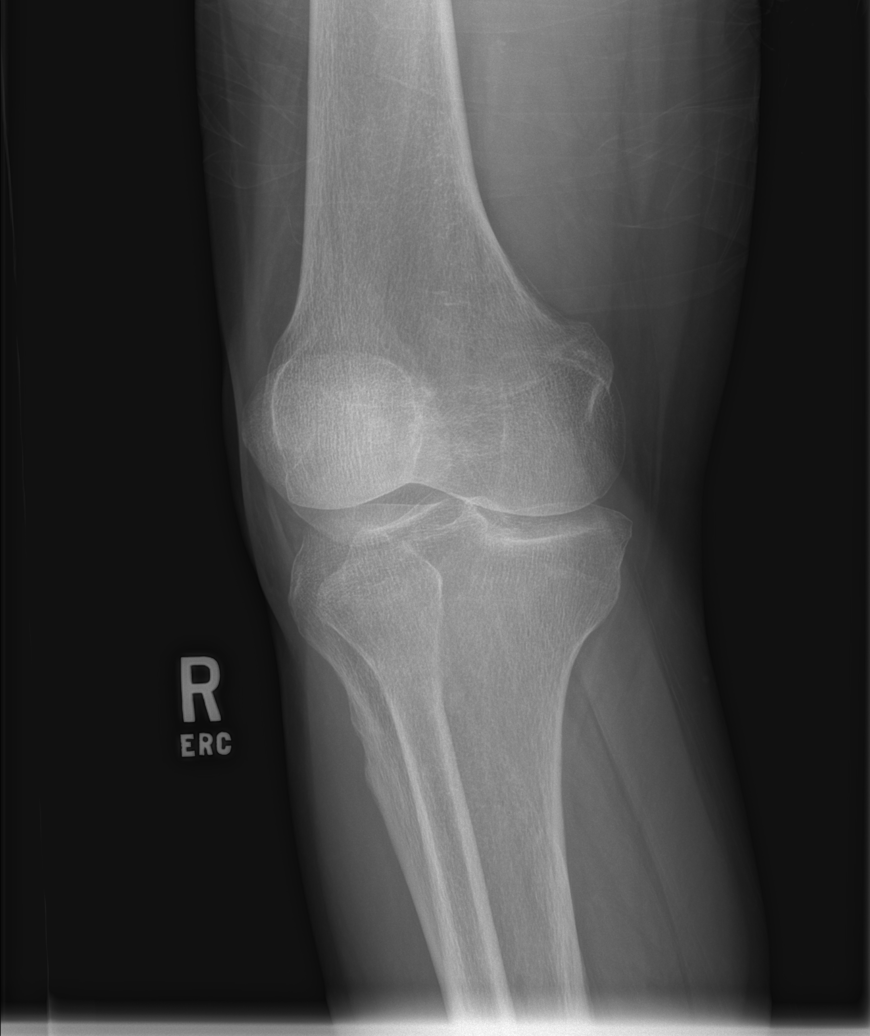
[im 4/5]
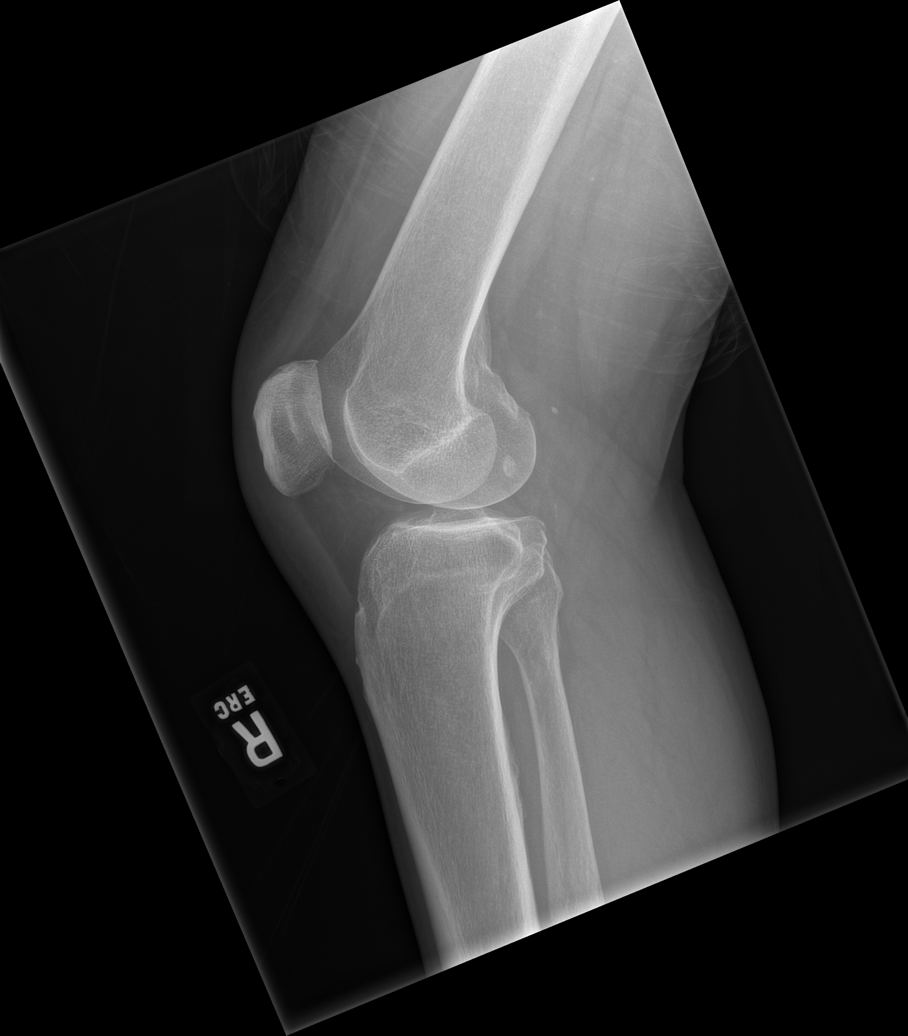
[im 5/5]
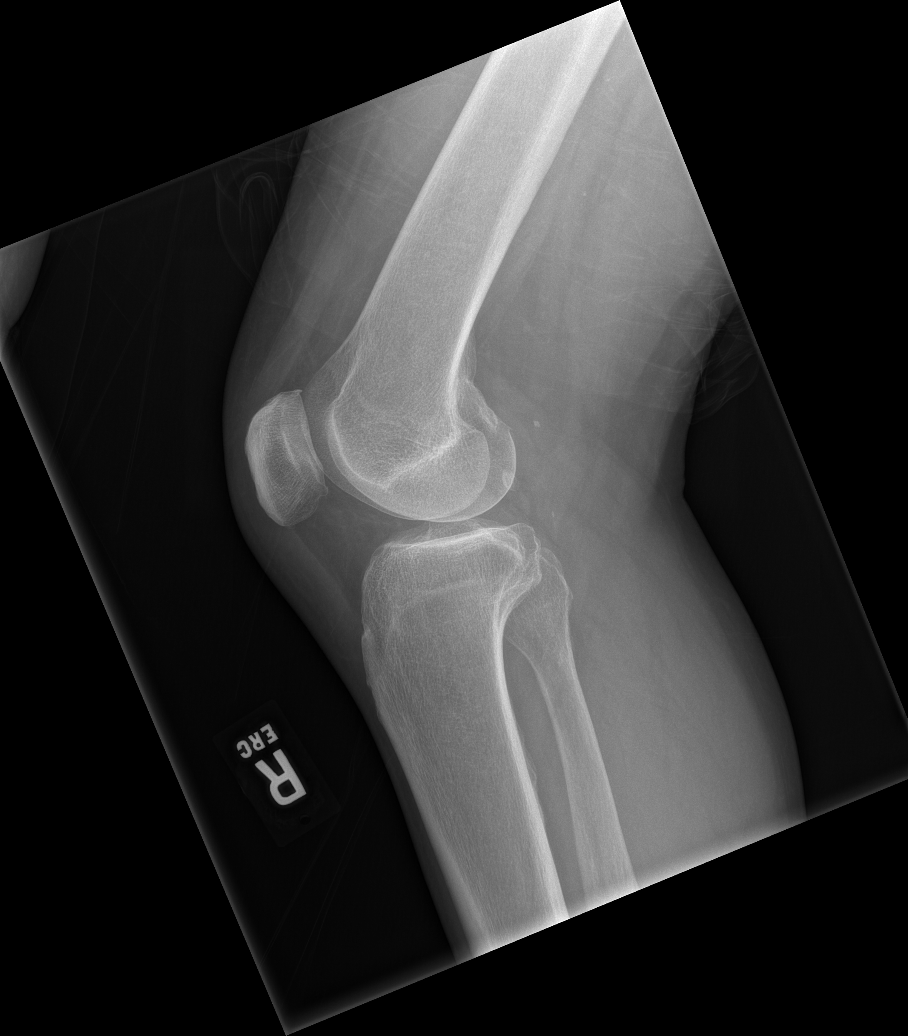

[5 of 5 positions shown; findings below may reference images not displayed]

IMPRESSION: I do not see evidence of an acute fracture nor dislocation
of the knee.  Followup imaging is available if the patient's symptoms
persist and remain unexplained.

## 2012-01-10 DIAGNOSIS — M4800 Spinal stenosis, site unspecified: Secondary | ICD-10-CM | POA: Insufficient documentation

## 2012-05-01 LAB — COMPREHENSIVE METABOLIC PANEL
Albumin: 2.3 g/dL — ABNORMAL LOW (ref 3.4–5.0)
Alkaline Phosphatase: 44 U/L — ABNORMAL LOW (ref 50–136)
Calcium, Total: 5.5 mg/dL — CL (ref 8.5–10.1)
Co2: 18 mmol/L — ABNORMAL LOW (ref 21–32)
EGFR (Non-African Amer.): >60
Glucose: 115 mg/dL — ABNORMAL HIGH (ref 65–99)
Osmolality: 290 (ref 275–301)
SGPT (ALT): 20 U/L (ref 12–78)
Sodium: 145 mmol/L (ref 136–145)

## 2012-05-01 LAB — CBC
HGB: 9.7 g/dL — ABNORMAL LOW (ref 13.0–18.0)
MCH: 33.9 pg (ref 26.0–34.0)
RBC: 2.86 10*6/uL — ABNORMAL LOW (ref 4.40–5.90)
RDW: 12.6 % (ref 11.5–14.5)
WBC: 10.7 10*3/uL — ABNORMAL HIGH (ref 3.8–10.6)

## 2012-05-01 LAB — TROPONIN I: Troponin-I: <0.02 ng/mL

## 2012-05-01 IMAGING — CT CT HEAD WITHOUT CONTRAST
2 series · 16 of 30 positions shown, 20 images · non-contrast
Comparison: none

REASON FOR EXAM: fall head injury
COMMENTS:

PROCEDURE:     CT  - CT HEAD WITHOUT CONTRAST  - [DATE]  [DATE]
RESULT:     Comparison:  None
TECHNIQUE: Multiple axial images from the foramen magnum to the vertex were
obtained without IV contrast.

[Series 2: without · axial · non-contrast · 0.43mm/px · z∈[-163,-38]mm · 13 of 31 slices shown, 17 images]
[im 3/31  brain]
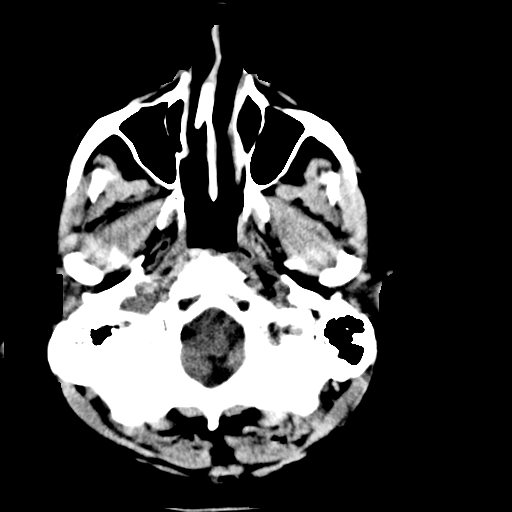
[im 3/31  bone]
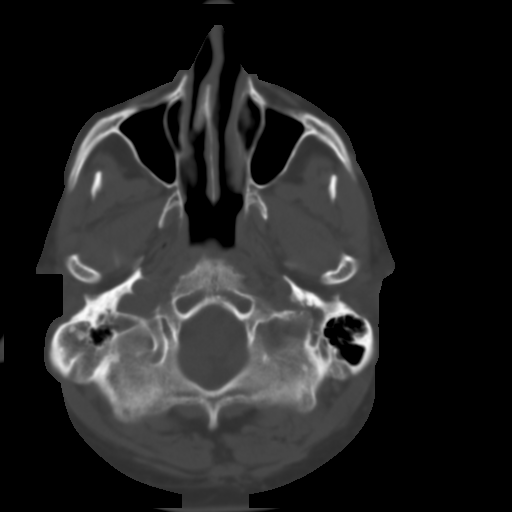
[im 5/31  brain]
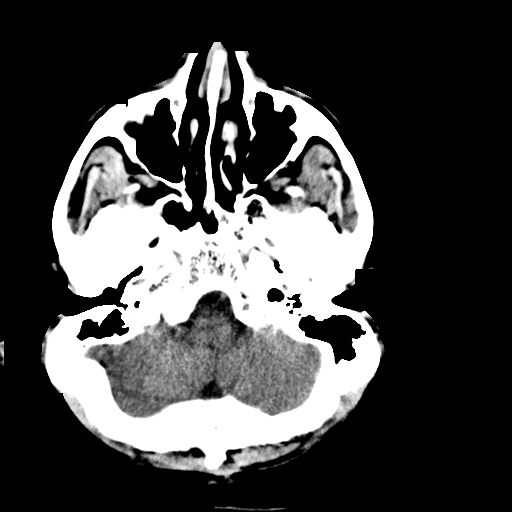
[im 7/31  brain]
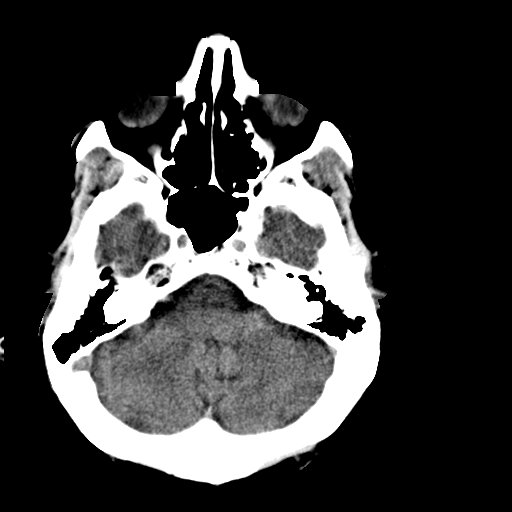
[im 9/31  brain]
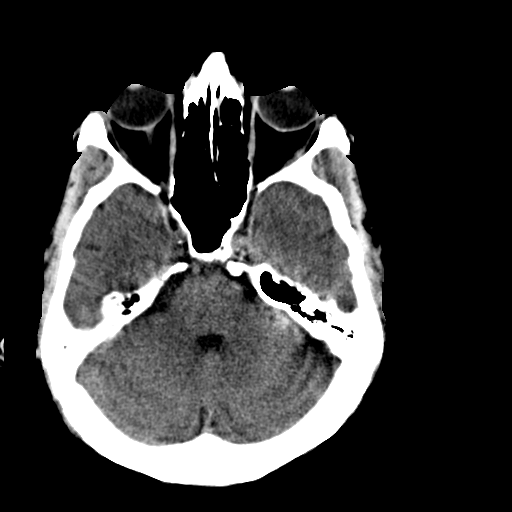
[im 11/31  brain]
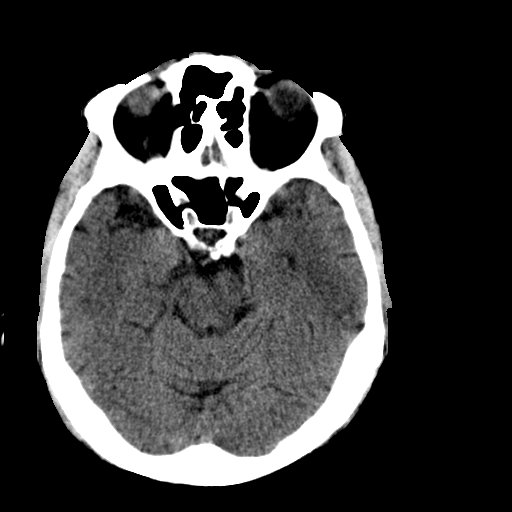
[im 11/31  bone]
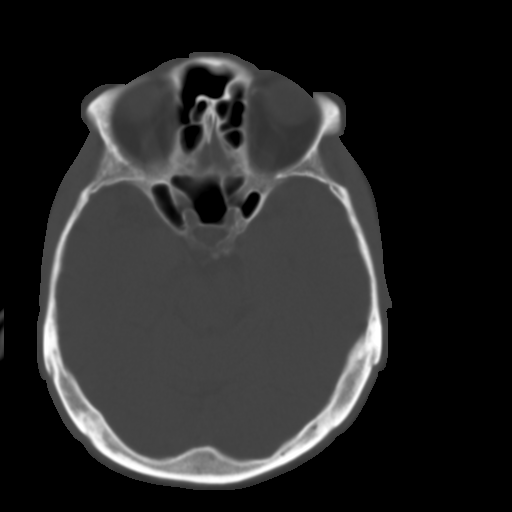
[im 13/31  brain]
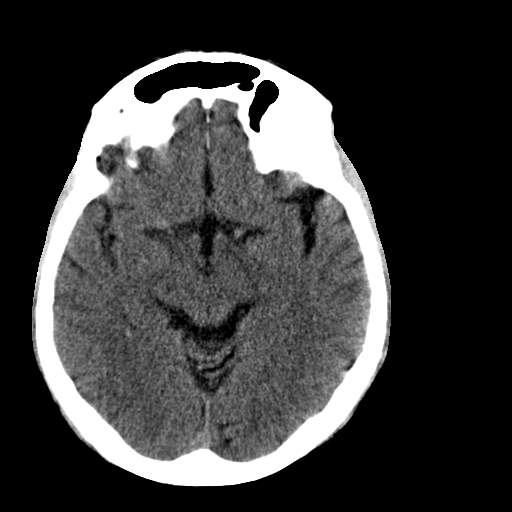
[im 16/31  brain]
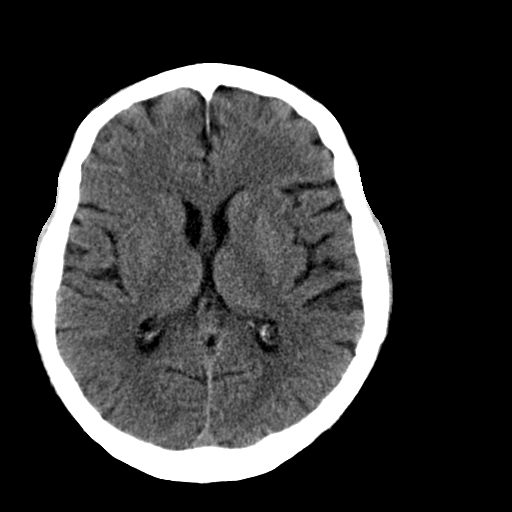
[im 18/31  brain]
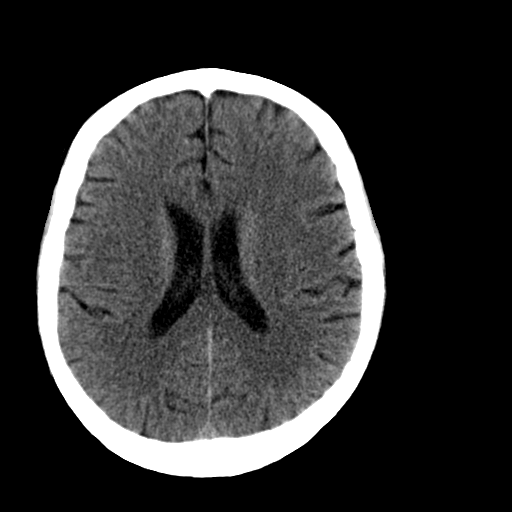
[im 20/31  brain]
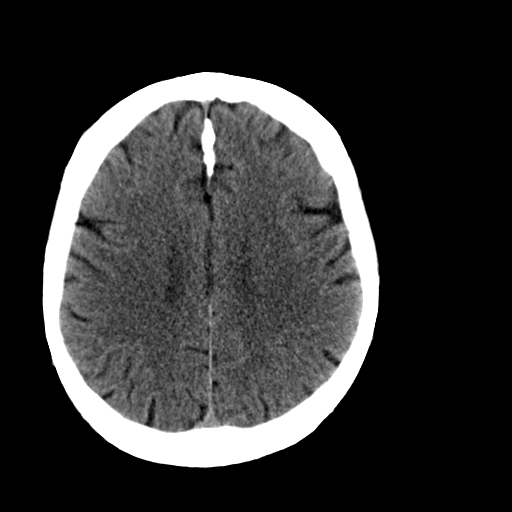
[im 20/31  bone]
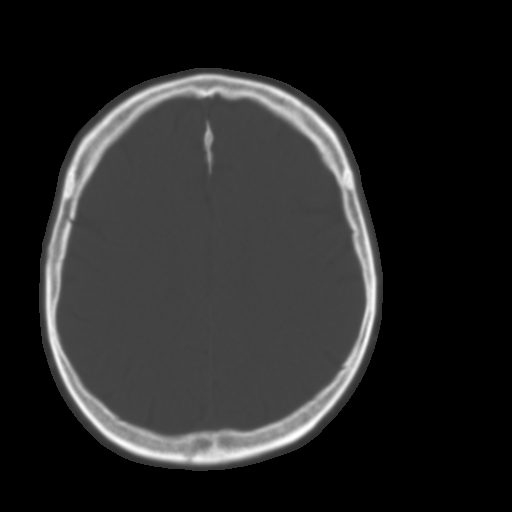
[im 22/31  brain]
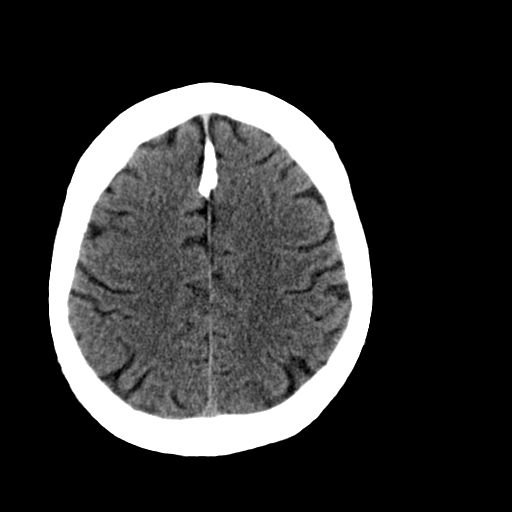
[im 24/31  brain]
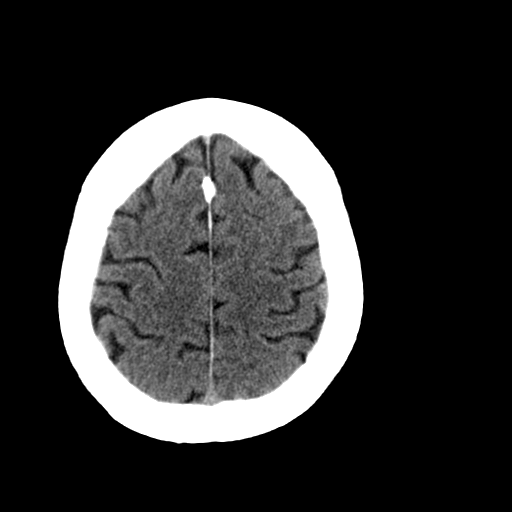
[im 26/31  brain]
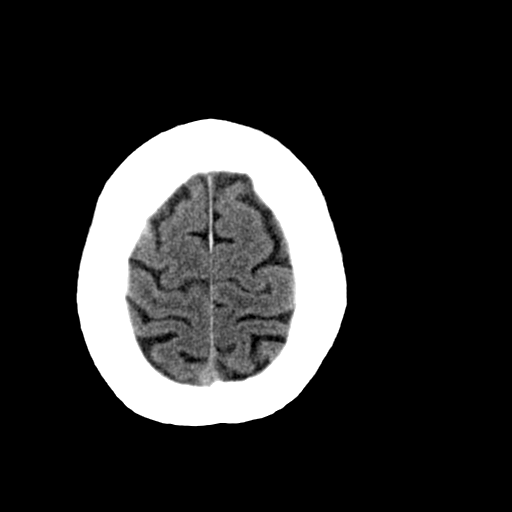
[im 28/31  brain]
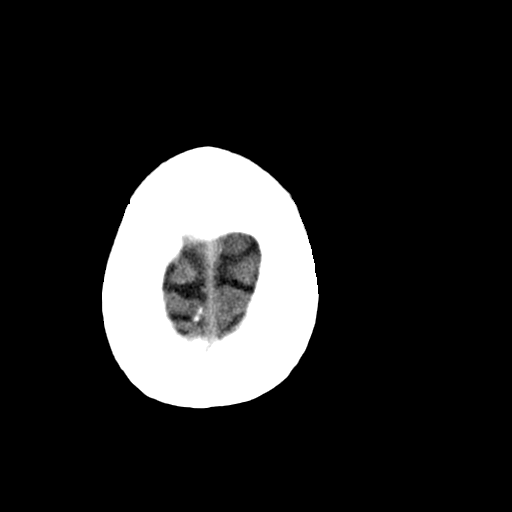
[im 28/31  bone]
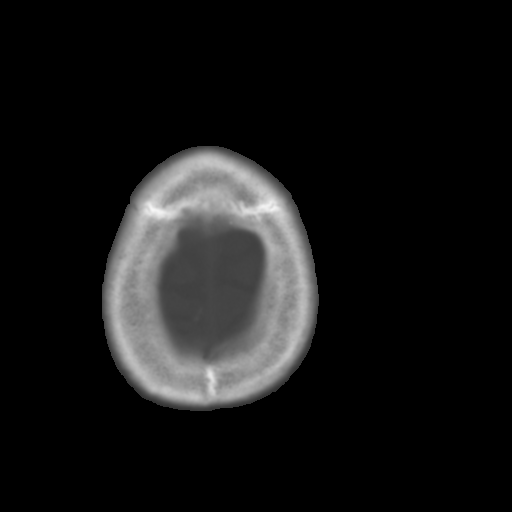

[Series 3: bone · axial · 0.43mm/px · z∈[-163,-123]mm · 3 of 31 slices shown]
[im 3/31  bone]
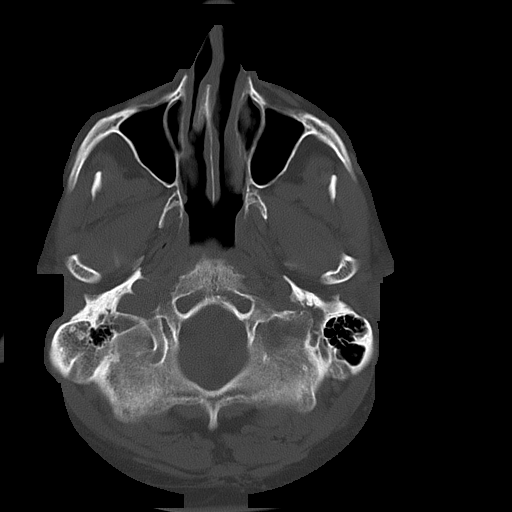
[im 7/31  bone]
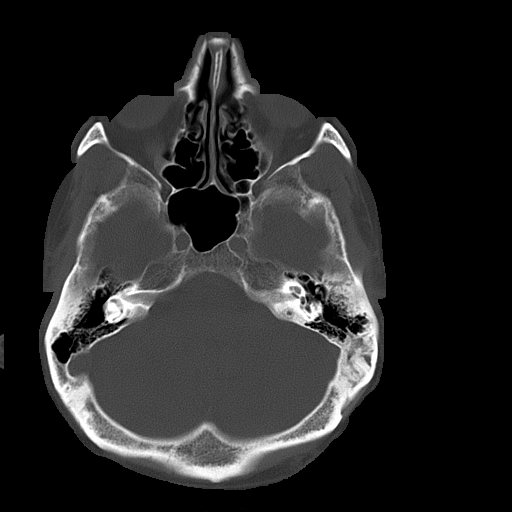
[im 11/31  bone]
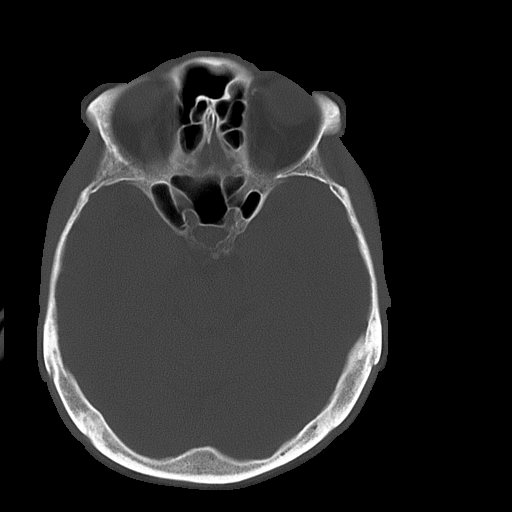

[16 of 30 positions shown; findings below may reference images not displayed]

FINDINGS: There is no evidence of mass effect, midline shift, or extra-axial fluid
collections.  There is no evidence of a space-occupying lesion or
intracranial hemorrhage. There is no evidence of a cortical-based area of
acute infarction.

The ventricles and sulci are appropriate for the patient's age. The basal
cisterns are patent.

Visualized portions of the orbits are unremarkable. The visualized portions
of the paranasal sinuses and mastoid air cells are unremarkable.

The osseous structures are unremarkable.
IMPRESSION: No acute intracranial process.

[REDACTED]

## 2012-05-01 IMAGING — CR DG CHEST 1V PORT
1 series · 1 of 1 positions shown · non-contrast
Comparison: none

REASON FOR EXAM: fall weakness shoulder pain
COMMENTS:

PROCEDURE:     DXR - DXR PORTABLE CHEST SINGLE VIEW  - [DATE] [DATE]
RESULT:     Comparison: None

[ap]
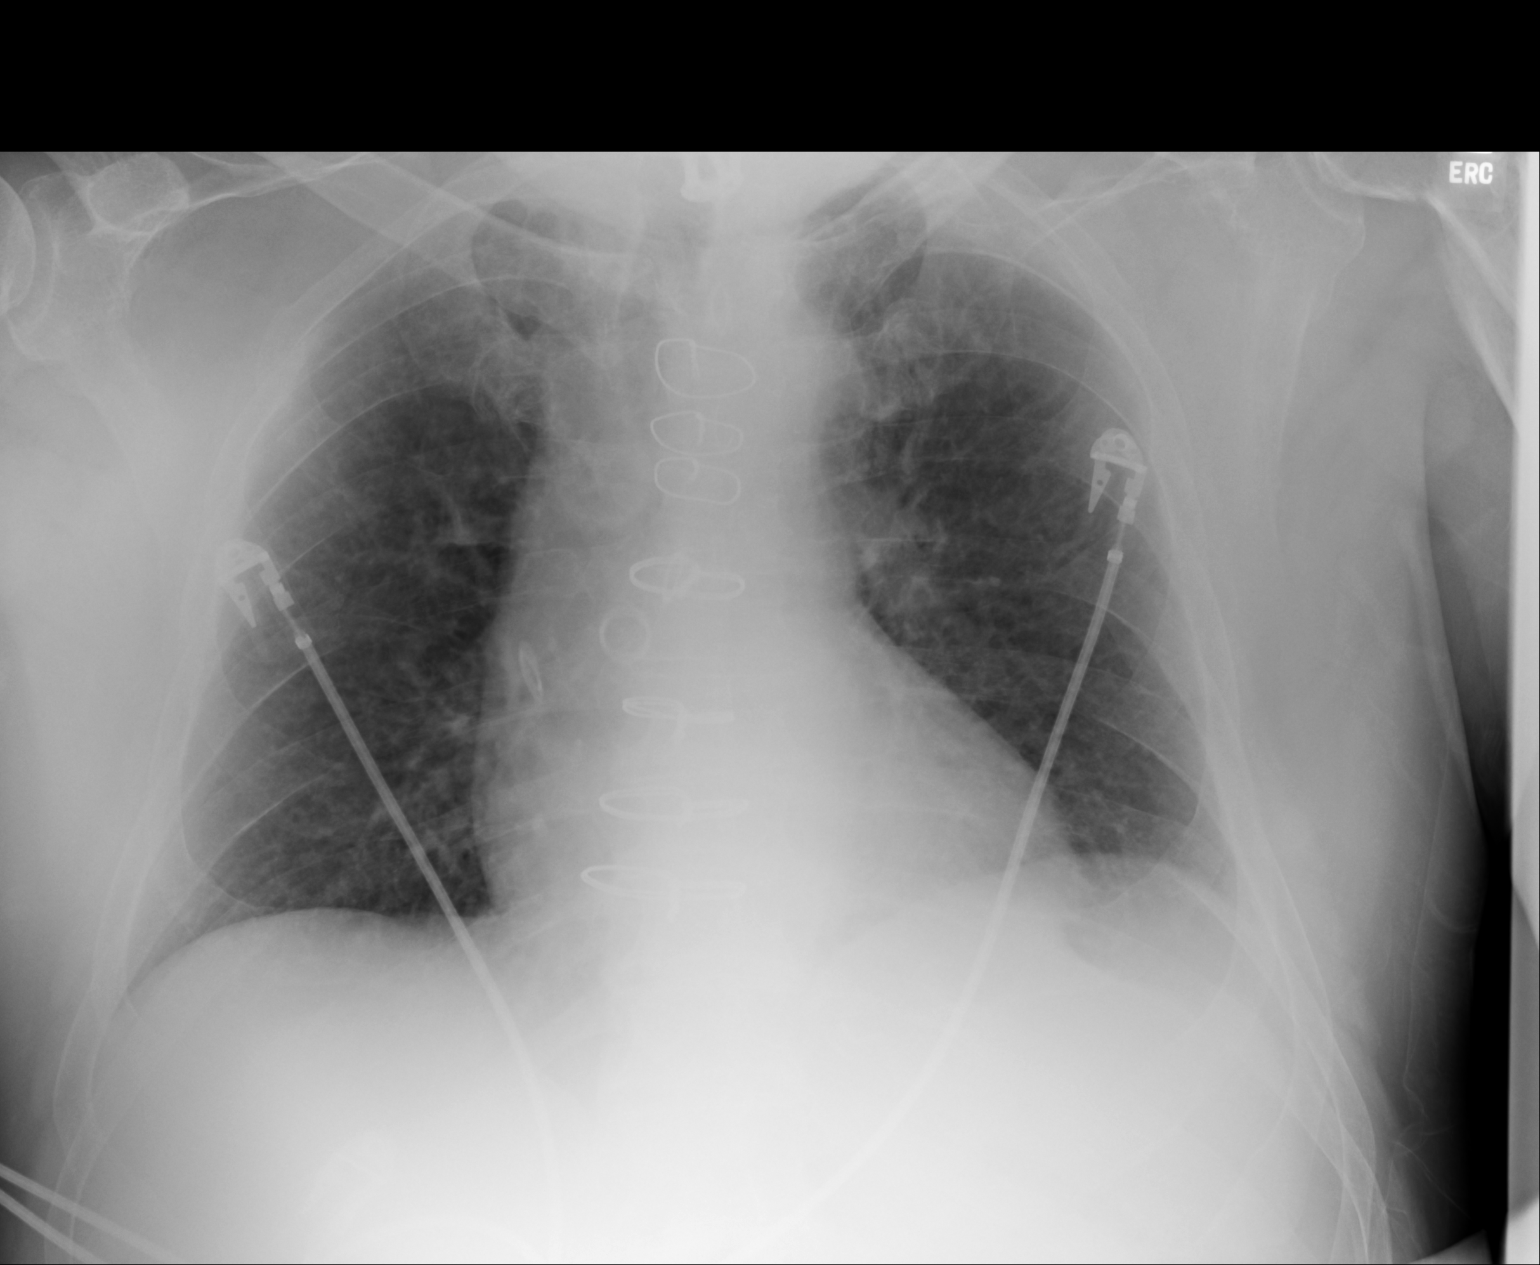

[1 of 1 positions shown; findings below may reference images not displayed]

FINDINGS: Single portable AP chest radiograph is provided.  There is no focal
parenchymal opacity, pleural effusion, or pneumothorax. Normal
cardiomediastinal silhouette. Prior CABG. The osseous structures are
unremarkable.
IMPRESSION: No acute disease of the che[REDACTED]

## 2012-05-01 IMAGING — CT CT CERVICAL SPINE WITHOUT CONTRAST
1 series · 12 of 14 positions shown, 15 images · non-contrast
Comparison: None

REASON FOR EXAM: fall head injury
COMMENTS:

PROCEDURE:     CT  - CT CERVICAL SPINE WO  - [DATE]  [DATE]
RESULT:     Clinical Indication: Trauma
TECHNIQUE: Multiple axial CT images from the skull base to the mid vertebral
body of T1. obtained with sagittal and coronal reformatted images provided.

[Series 6: axial · axial · 0.33mm/px · z∈[-341,-193]mm · 12 of 98 slices shown, 15 images]
[im 8/98  soft-tissue]
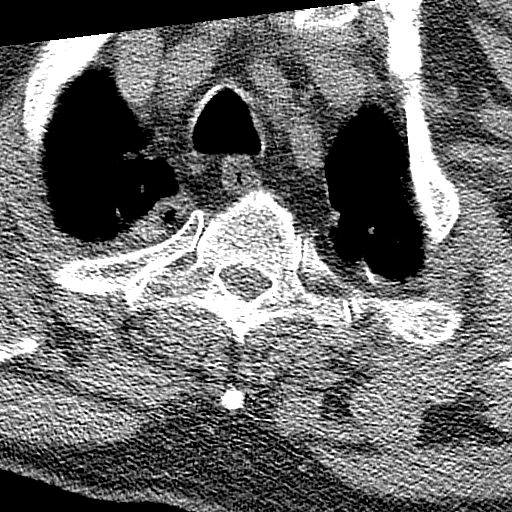
[im 8/98  bone]
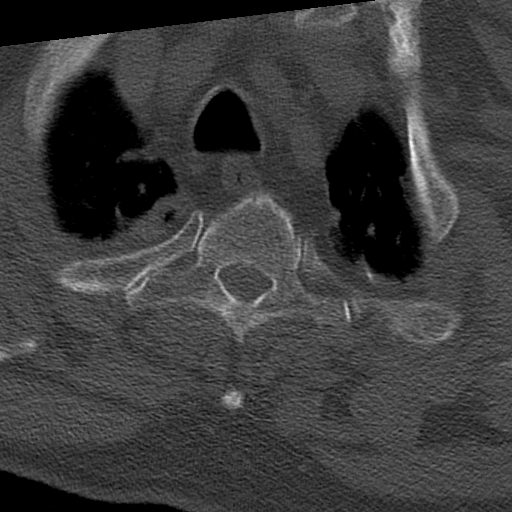
[im 15/98  bone]
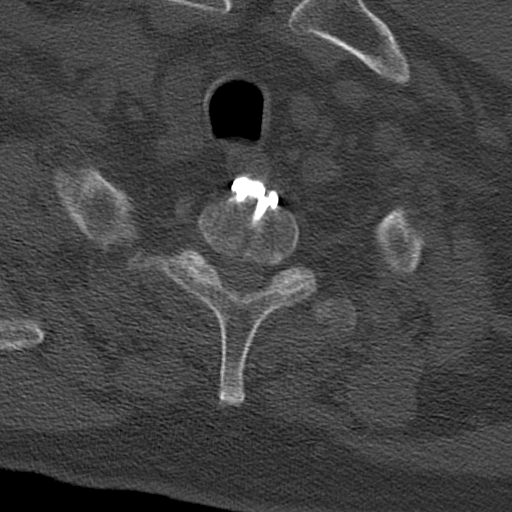
[im 23/98  bone]
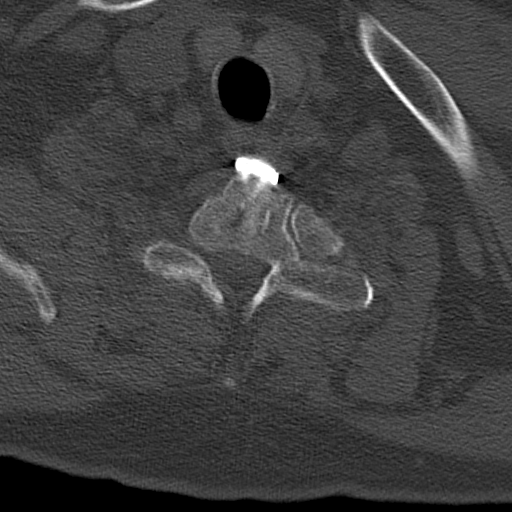
[im 30/98  bone]
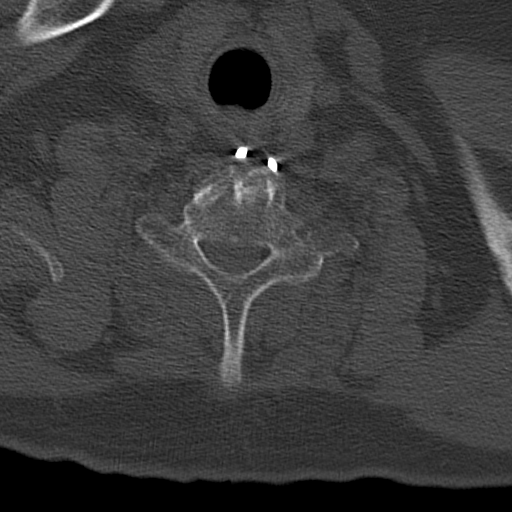
[im 38/98  soft-tissue]
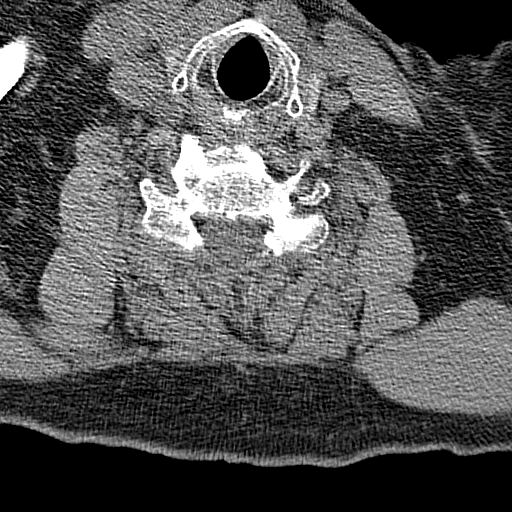
[im 38/98  bone]
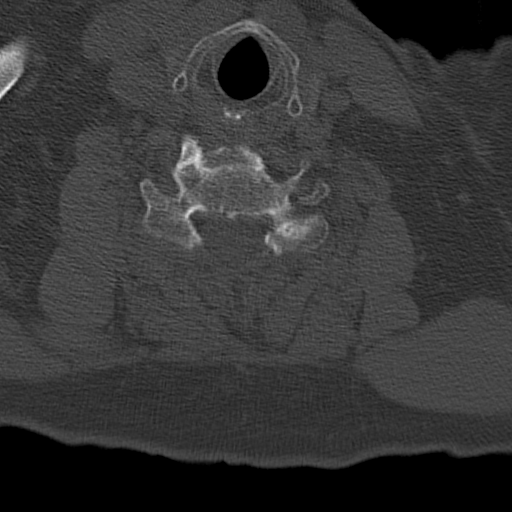
[im 45/98  bone]
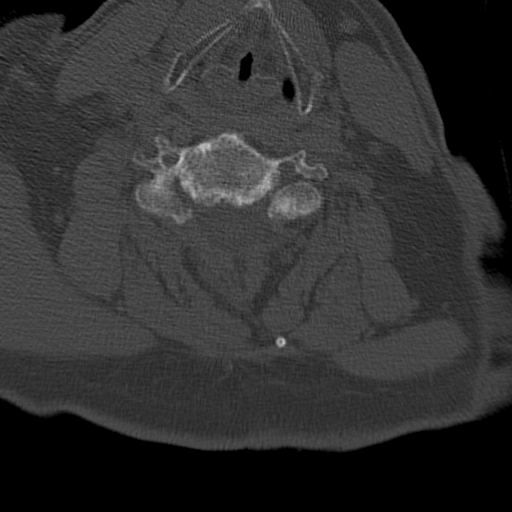
[im 53/98  bone]
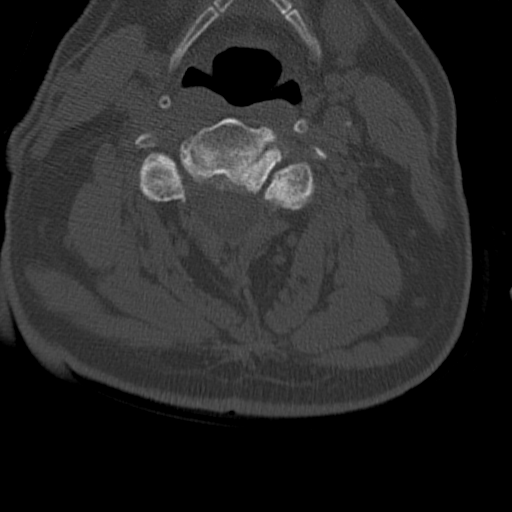
[im 60/98  bone]
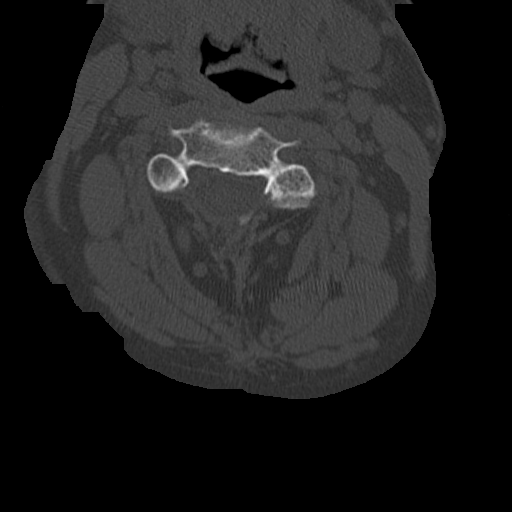
[im 68/98  soft-tissue]
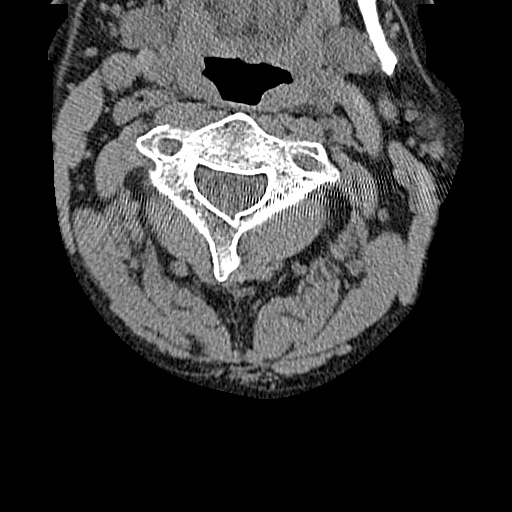
[im 68/98  bone]
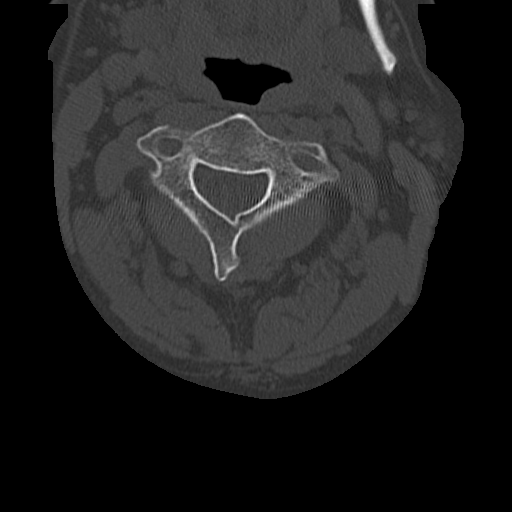
[im 75/98  bone]
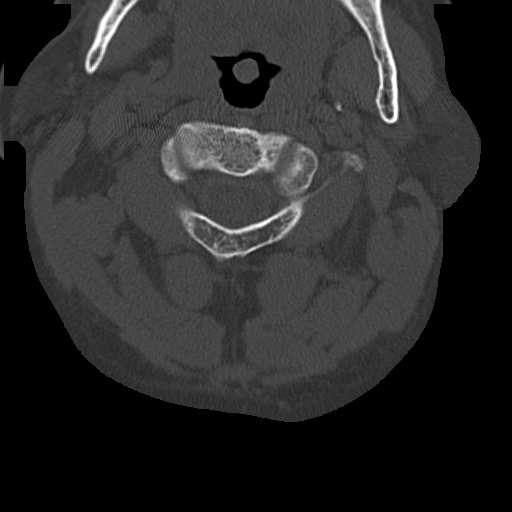
[im 83/98  bone]
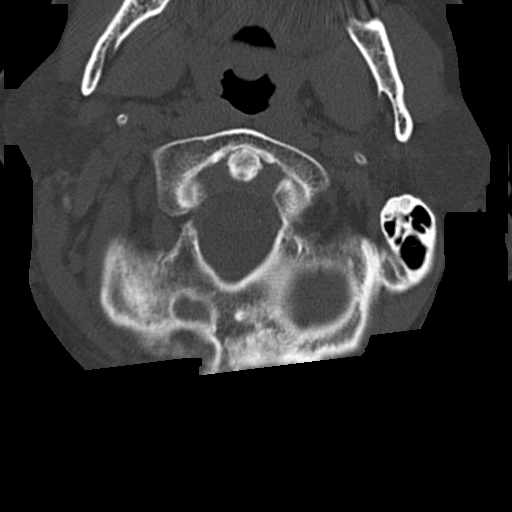
[im 90/98  bone]
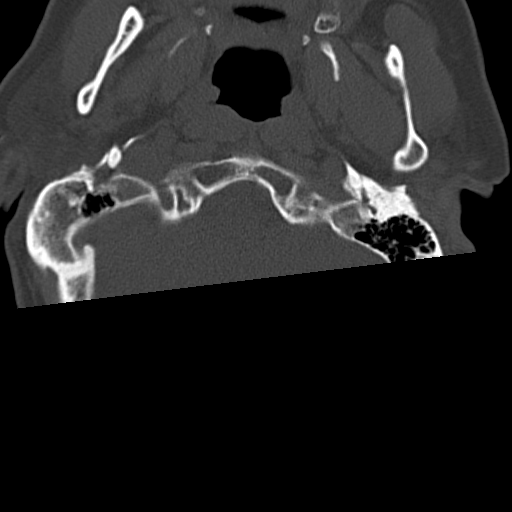

[12 of 14 positions shown; findings below may reference images not displayed]

FINDINGS: The alignment is anatomic. The vertebral body heights are maintained. There
is no acute fracture or static listhesis. The prevertebral soft tissues are
normal. The intraspinal soft tissues are not fully imaged on this
examination due to poor soft tissue contrast, but there is no soft tissue
gross abnormality.

There is intervertebral disc fusion from C5 through C7 without hardware
failure or complication. There is osseous bridging across the C5-C6 and
C6-C7 disc spaces. There is posterior decompression at the C3-C5.

The visualized portions of the lung apices demonstrate no focal abnormality.
IMPRESSION: 1. No acute osseous injury of the cervical spine.

2. Ligamentous injury is not evaluated. If there is high clinical concern
for ligamentous injury, consider MRI or flexion/extension radiographs as
clinically indicated and tolerated.

[REDACTED]

## 2012-05-01 IMAGING — CR DG SHOULDER 3+V*L*
1 series · 2 of 2 positions shown · non-contrast
Comparison: None

REASON FOR EXAM: fall injury
COMMENTS:

PROCEDURE:     DXR - DXR SHOULDER LEFT COMPLETE  - [DATE]  [DATE]
RESULT:     History: Fall

[Series 1: t shoulder internal left · 0.14mm/px · 2 of 2 slices shown]
[im 1/2]
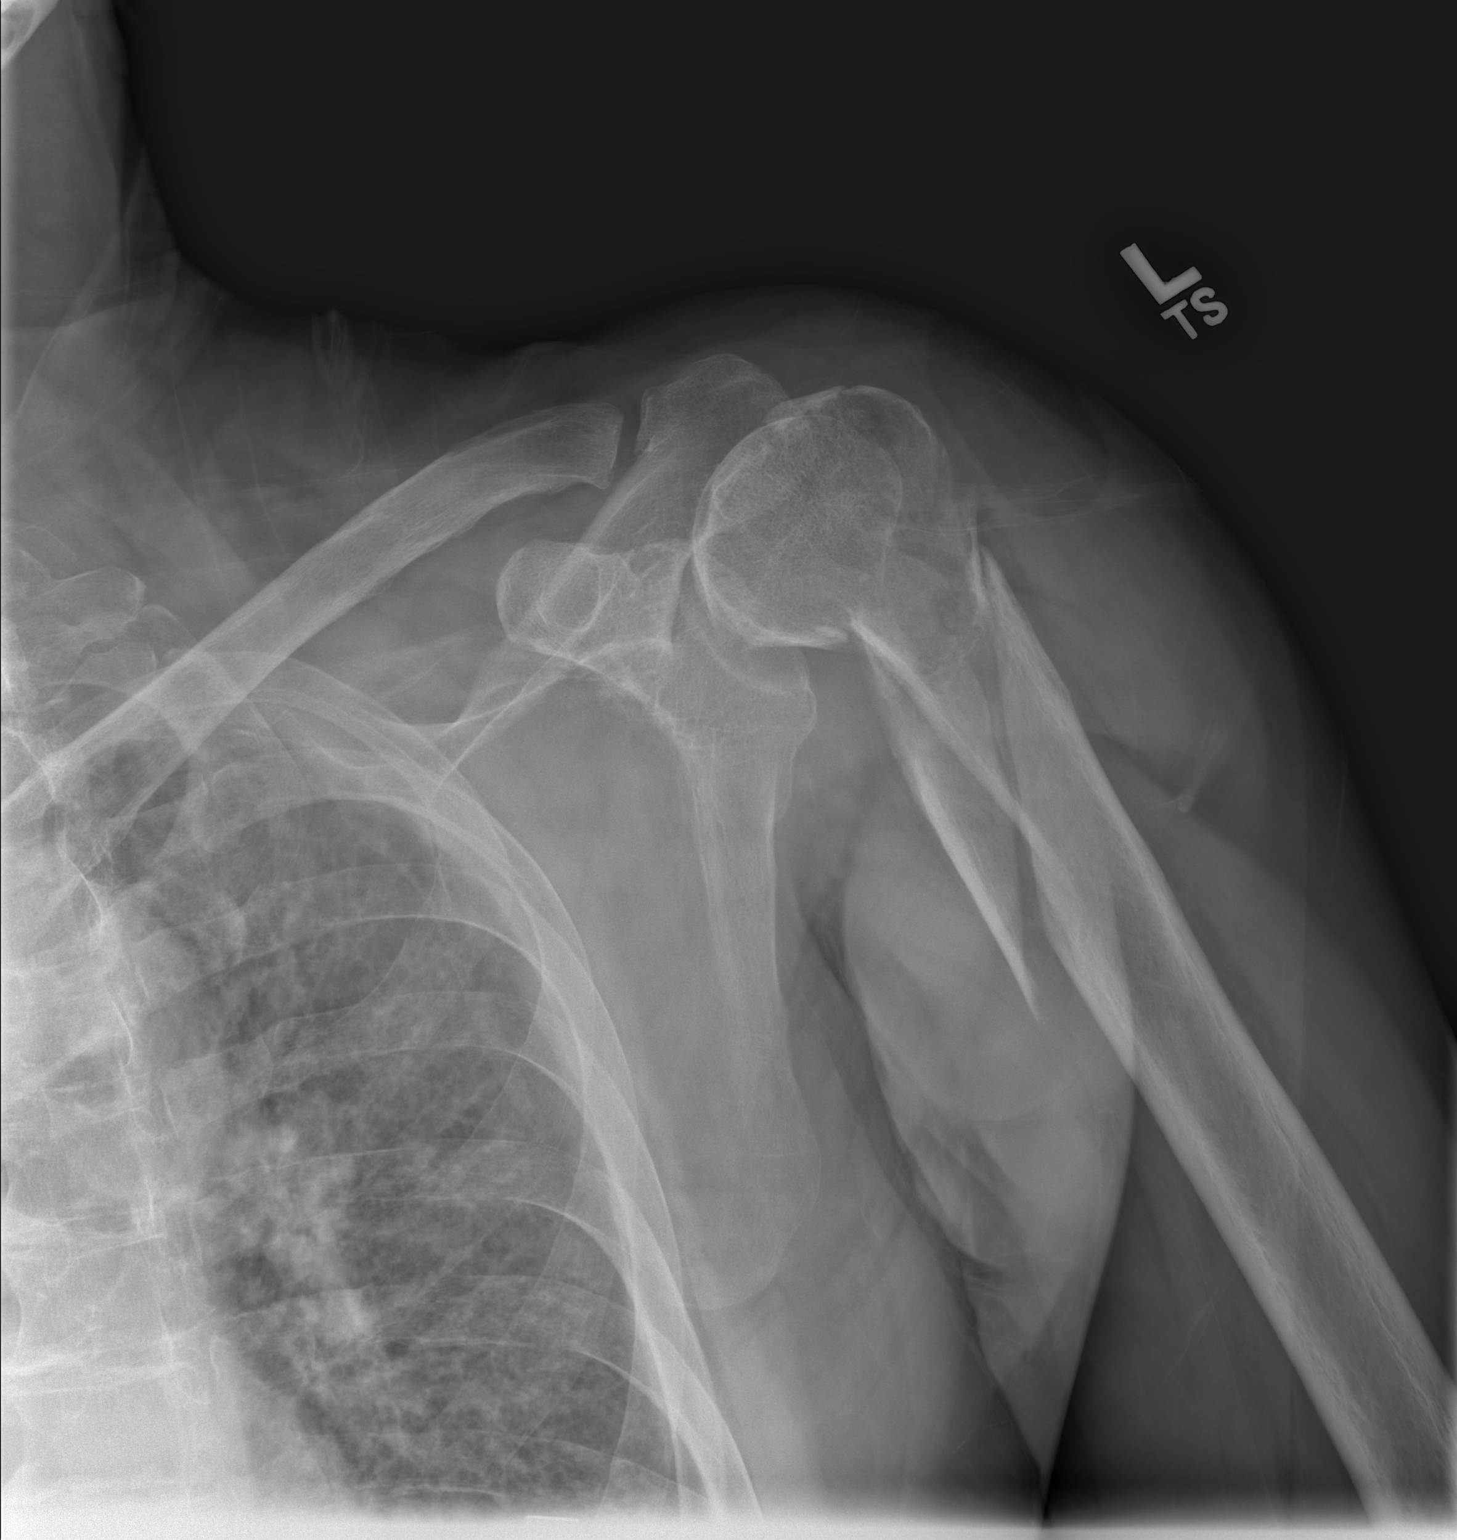
[im 2/2]
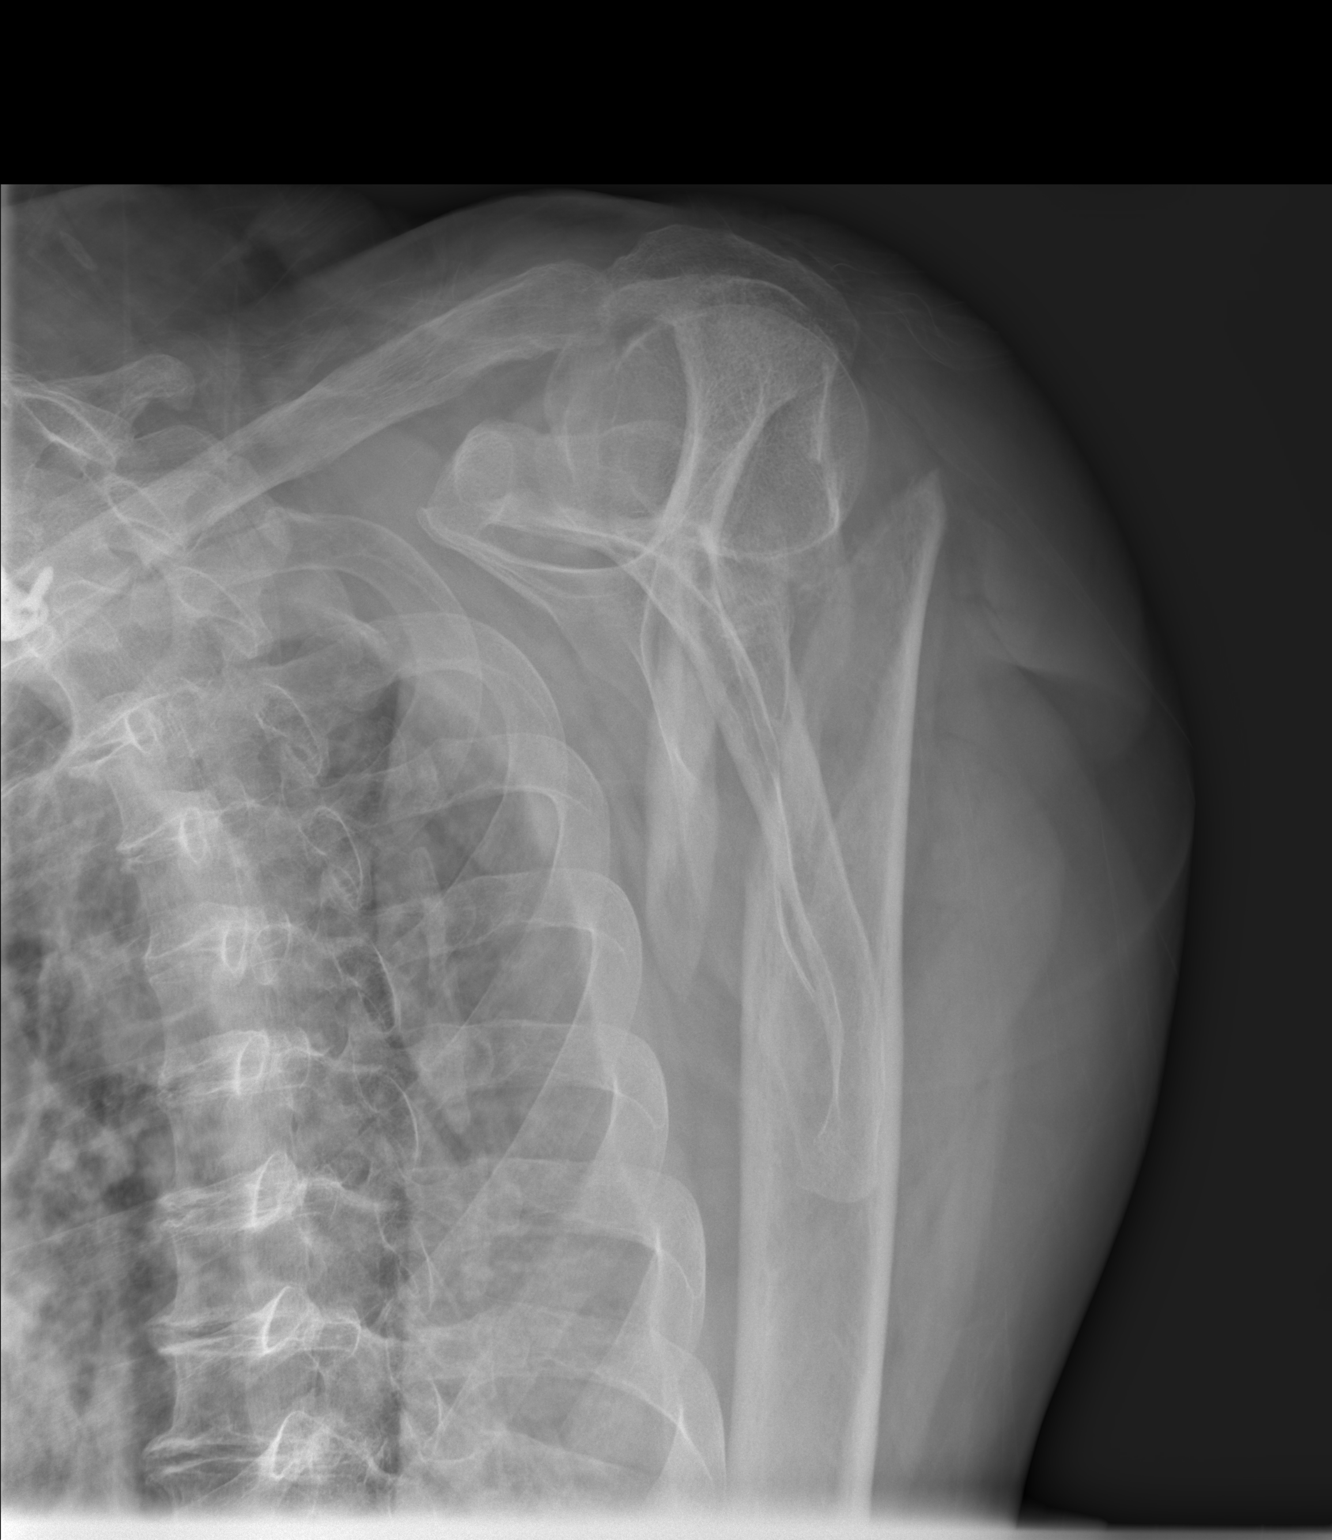

[2 of 2 positions shown; findings below may reference images not displayed]

FINDINGS: 2 views of the left shoulder demonstrates a comminuted fracture of the
proximal left humerus involving the surgical neck and proximal diaphysis.
The fracture cleft extends through the greater tuberosity. There is no
glenohumeral dislocation. The acromioclavicular joint is normal.
IMPRESSION: Please see above.

[REDACTED]

## 2012-05-01 IMAGING — CT CT OF THE LEFT SHOULDER WITHOUT CONTRAST
1 series · 12 of 14 positions shown, 15 images · non-contrast
Comparison: None

REASON FOR EXAM: fall injury fx
COMMENTS:

PROCEDURE:     CT  - CT SHOULDER LEFT WO  - [DATE] [DATE]
RESULT:     History: Fall
TECHNIQUE: Multiple axial images obtained of the left shoulder with sagittal
and coronal reformatted images provided.

[Series 2: shoulder 3mm · axial · 0.37mm/px · z∈[-210,-3]mm · 12 of 83 slices shown, 15 images]
[im 7/83  soft-tissue]
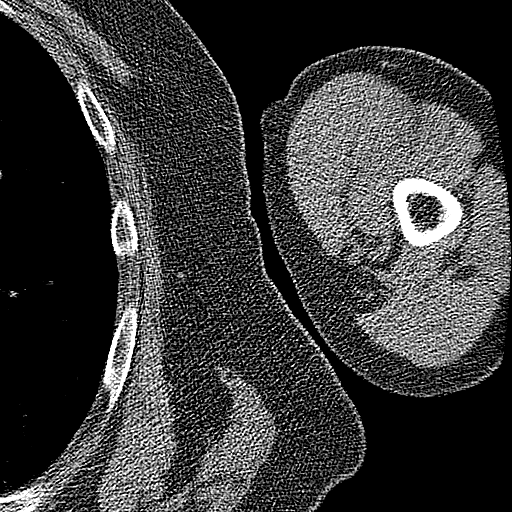
[im 7/83  bone]
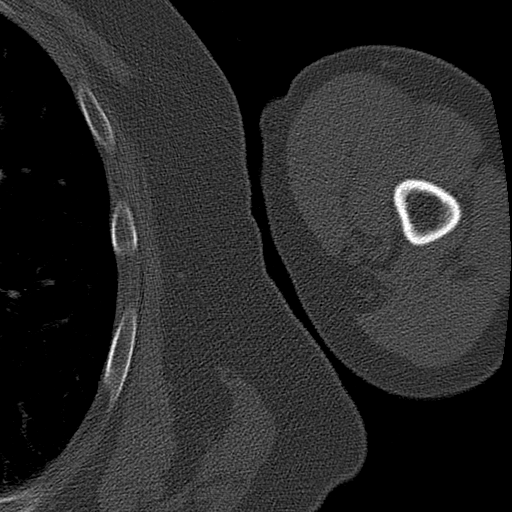
[im 13/83  bone]
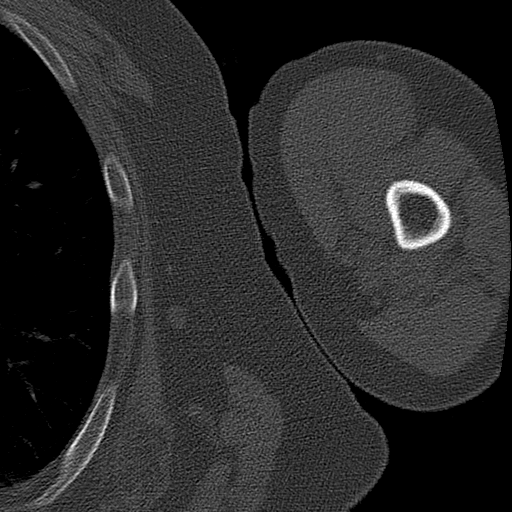
[im 19/83  bone]
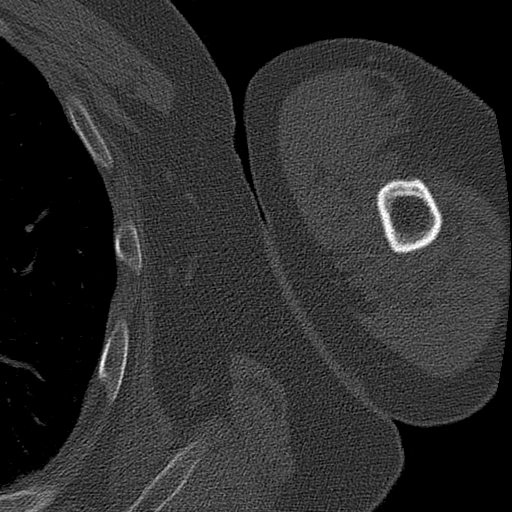
[im 26/83  bone]
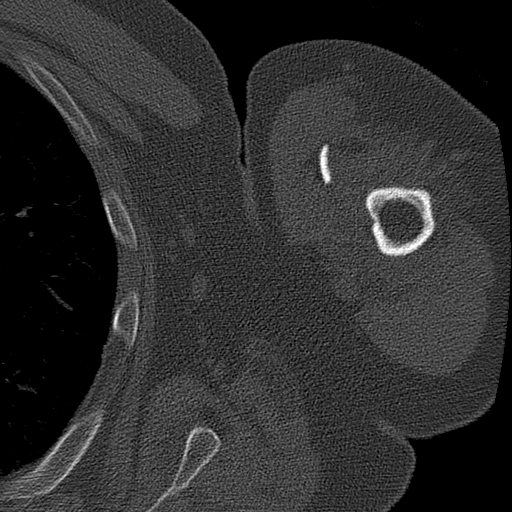
[im 32/83  soft-tissue]
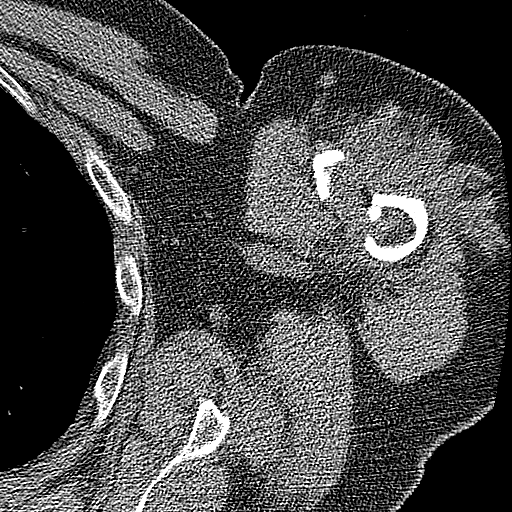
[im 32/83  bone]
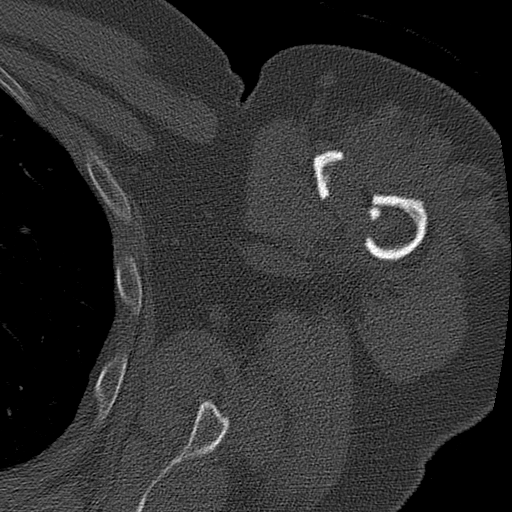
[im 38/83  bone]
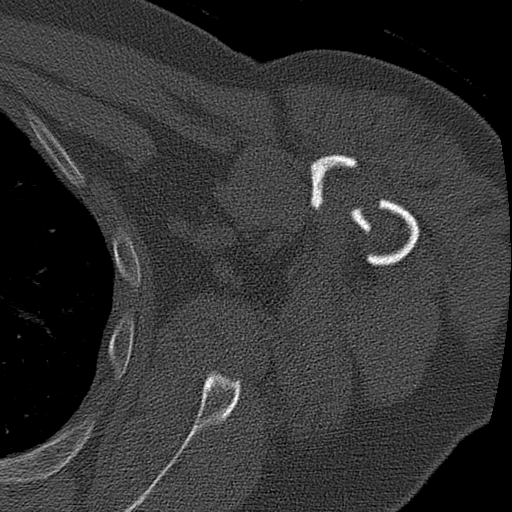
[im 45/83  bone]
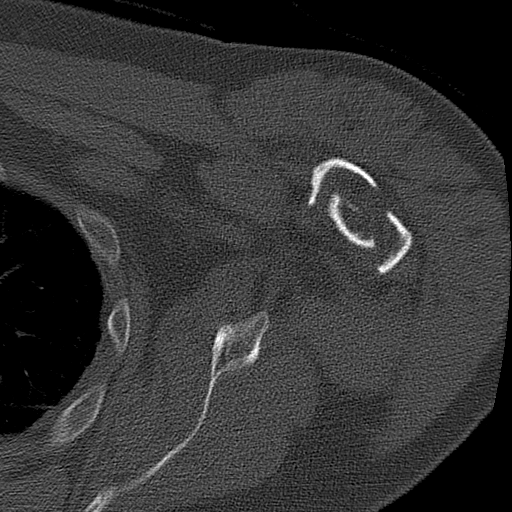
[im 51/83  bone]
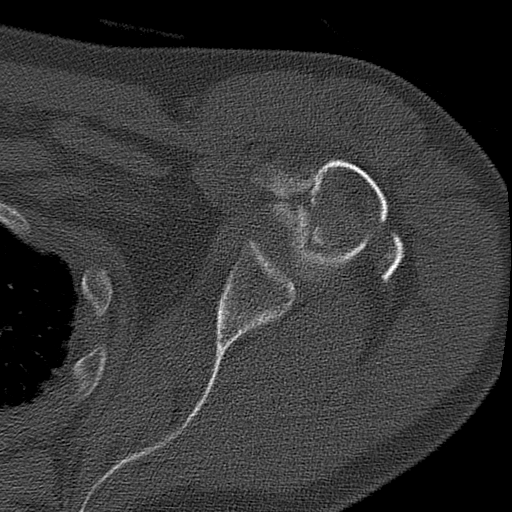
[im 57/83  soft-tissue]
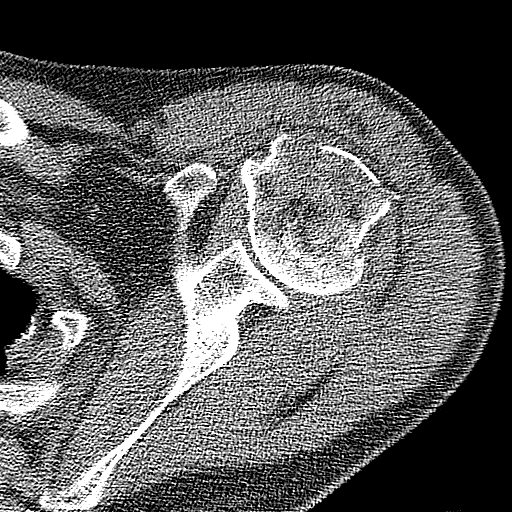
[im 57/83  bone]
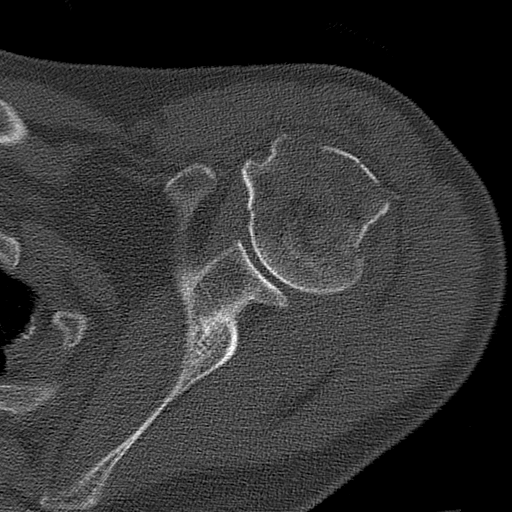
[im 64/83  bone]
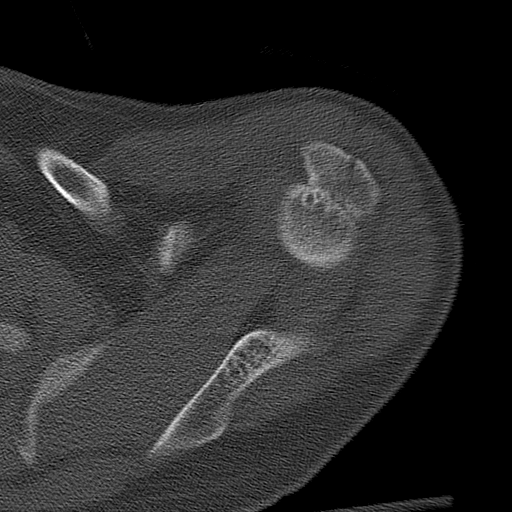
[im 70/83  bone]
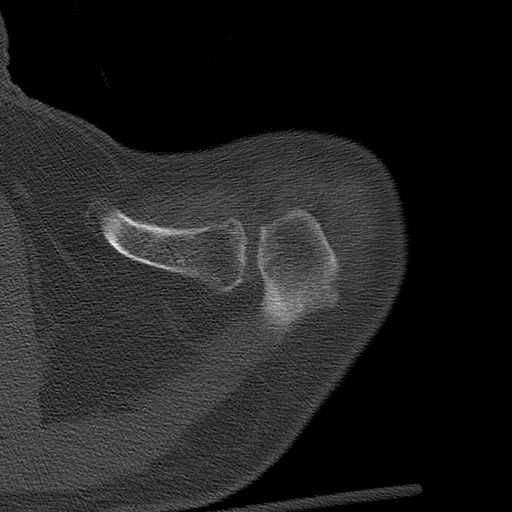
[im 76/83  bone]
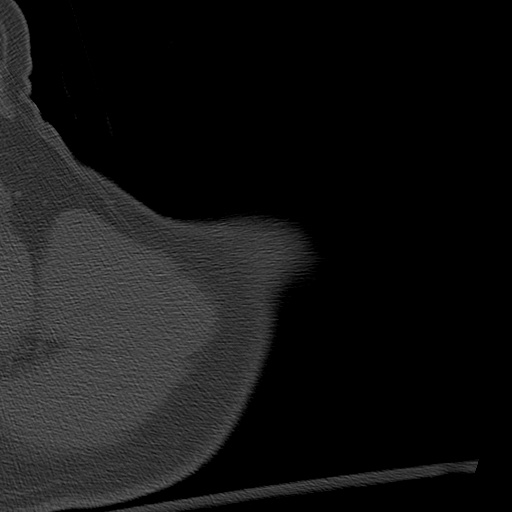

[12 of 14 positions shown; findings below may reference images not displayed]

FINDINGS: The left shoulder are demonstrated a comminuted fracture of the proximal
left humerus involving the surgical neck and proximal diaphysis. The
fracture cleft extends through the greater tuberosity. The fracture does not
involve the articular surface. There is no glenoid fracture. There are mild
degenerative changes of the AC joint.
IMPRESSION: Comminuted fracture of the left proximal humerus.

## 2012-05-02 ENCOUNTER — Ambulatory Visit: Payer: Self-pay | Admitting: Orthopedic Surgery

## 2012-05-02 LAB — BASIC METABOLIC PANEL
Anion Gap: 8 (ref 7–16)
Calcium, Total: 8.9 mg/dL (ref 8.5–10.1)
Chloride: 104 mmol/L (ref 98–107)
Co2: 23 mmol/L (ref 21–32)
Creatinine: 1.45 mg/dL — ABNORMAL HIGH (ref 0.60–1.30)
EGFR (African American): 58 — ABNORMAL LOW
Glucose: 174 mg/dL — ABNORMAL HIGH (ref 65–99)

## 2012-05-03 IMAGING — CR DG HUMERUS 2V *L*
1 series · 2 of 2 positions shown · non-contrast
Comparison: none

REASON FOR EXAM: post op
COMMENTS:   LMP: (Male)

PROCEDURE:     DXR - DXR HUMERUS LEFT  - [DATE]  [DATE]
RESULT:     Multiple skin staples are present over the mid proximal humerus.
There is a orthopedic plate with multiple screws from the femoral head into
the mid femoral shaft. Anatomic alignment has been achieved.

[Series 1: ap · 0.17mm/px · 2 of 2 slices shown]
[im 1/2]
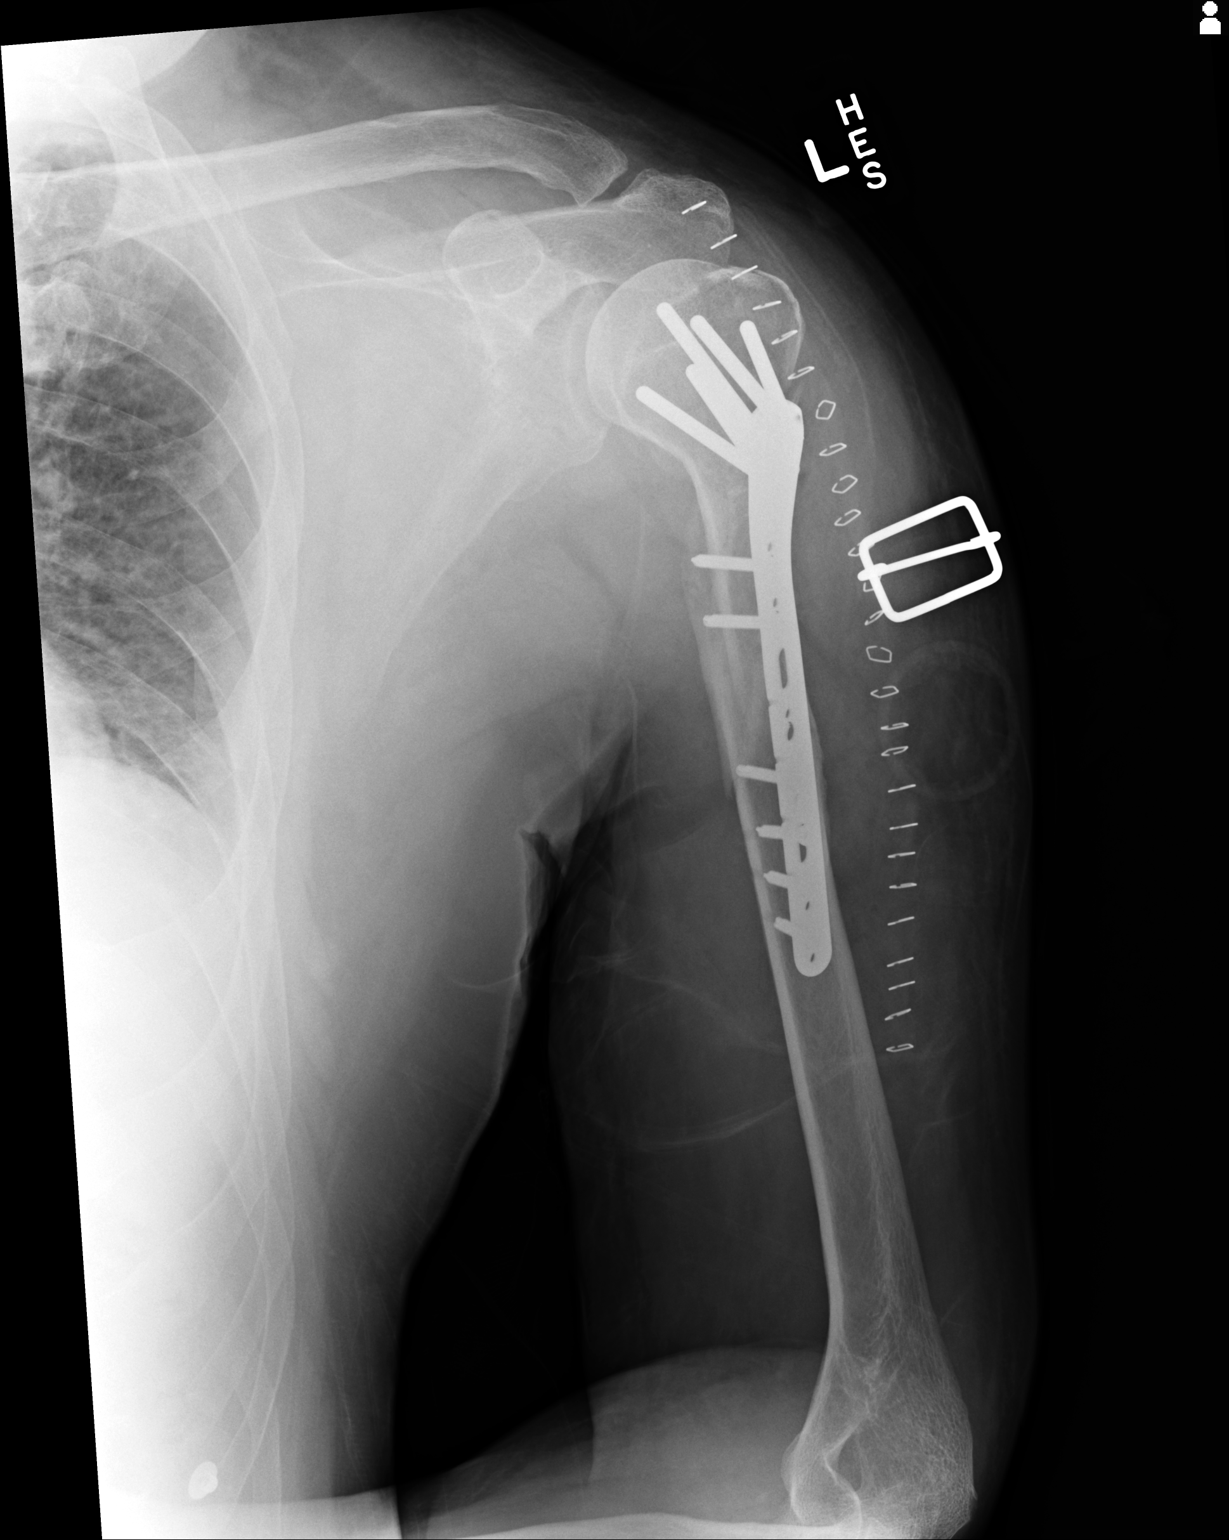
[im 2/2]
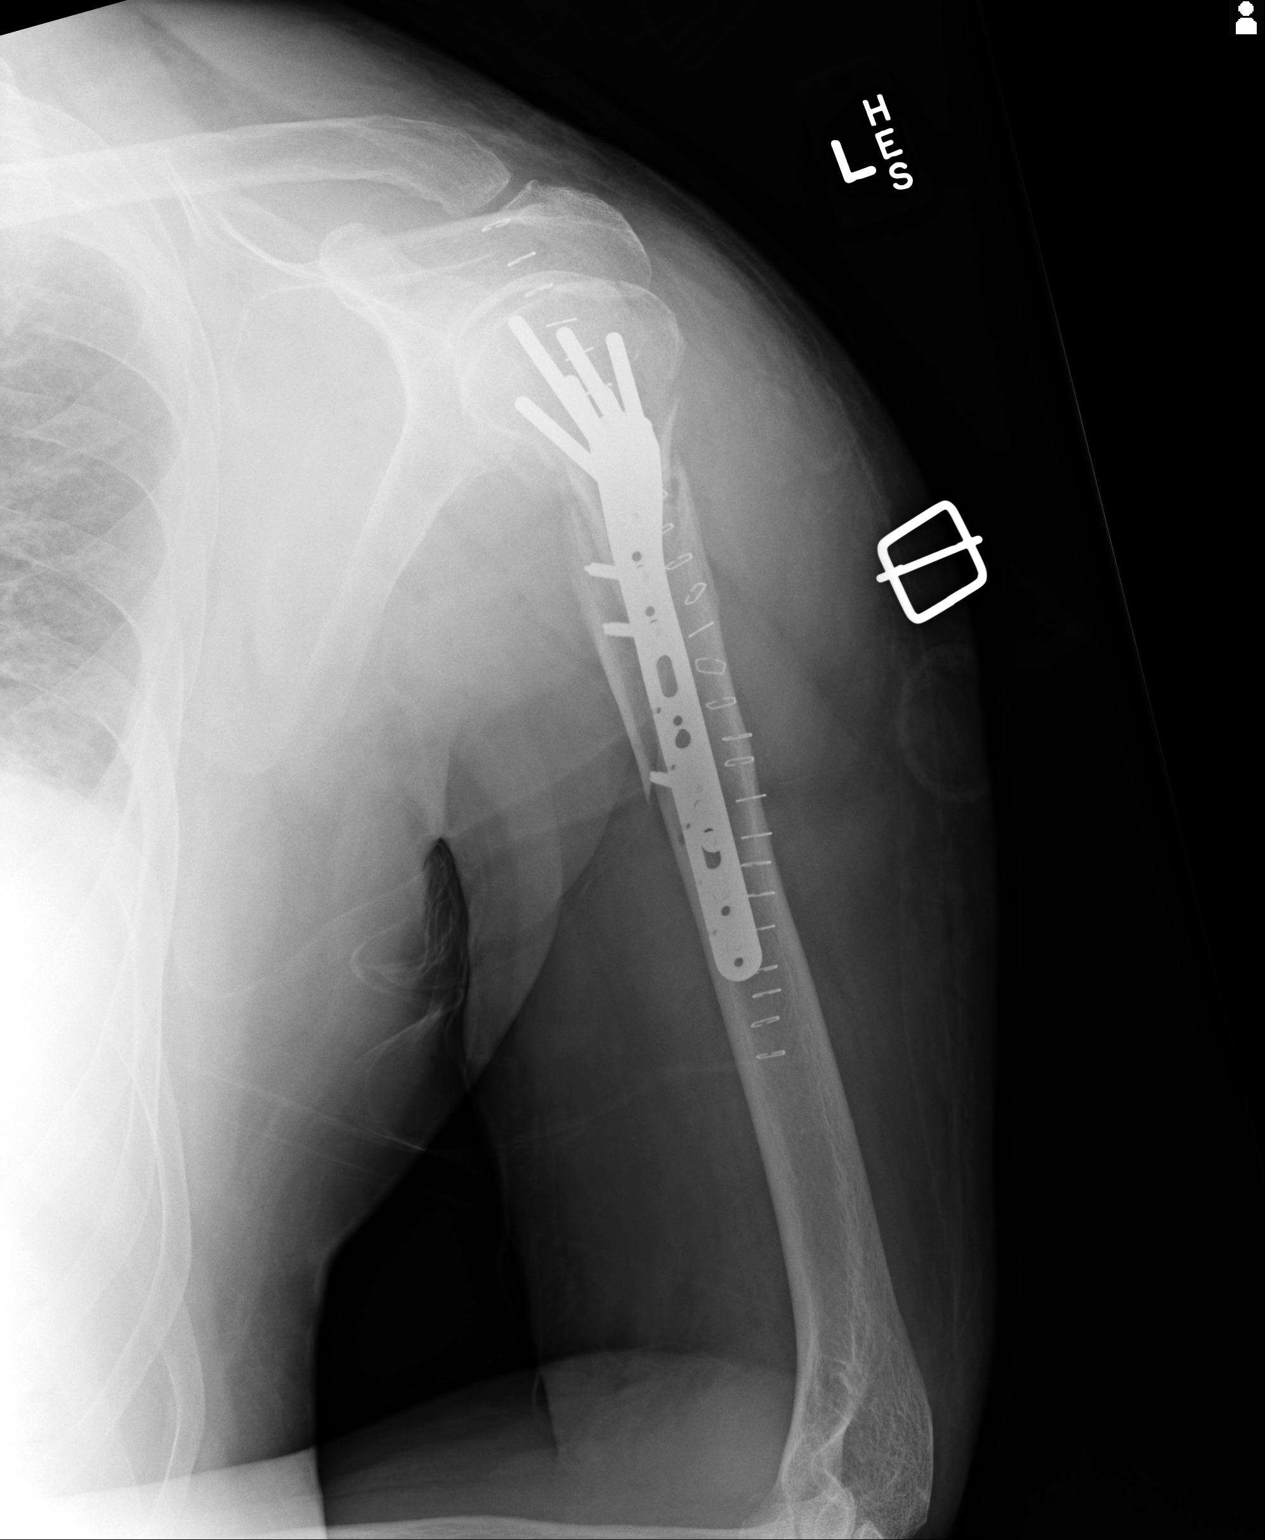

[2 of 2 positions shown; findings below may reference images not displayed]

IMPRESSION: Please see above.

[REDACTED]

## 2012-05-03 IMAGING — CR DG C-ARM 1-60 MIN
1 series · 3 of 3 positions shown · non-contrast
Comparison: none

REASON FOR EXAM: left shoulder fx
COMMENTS:

PROCEDURE:     DXR - DXR C-ARM 2 VIEWS LEFT SHOULDER  - [DATE]  [DATE]
RESULT:     Comparison: Left humerus [DATE]

[Series 6001: (person_name),(person_name) · 3 of 3 slices shown]
[im 1/3]
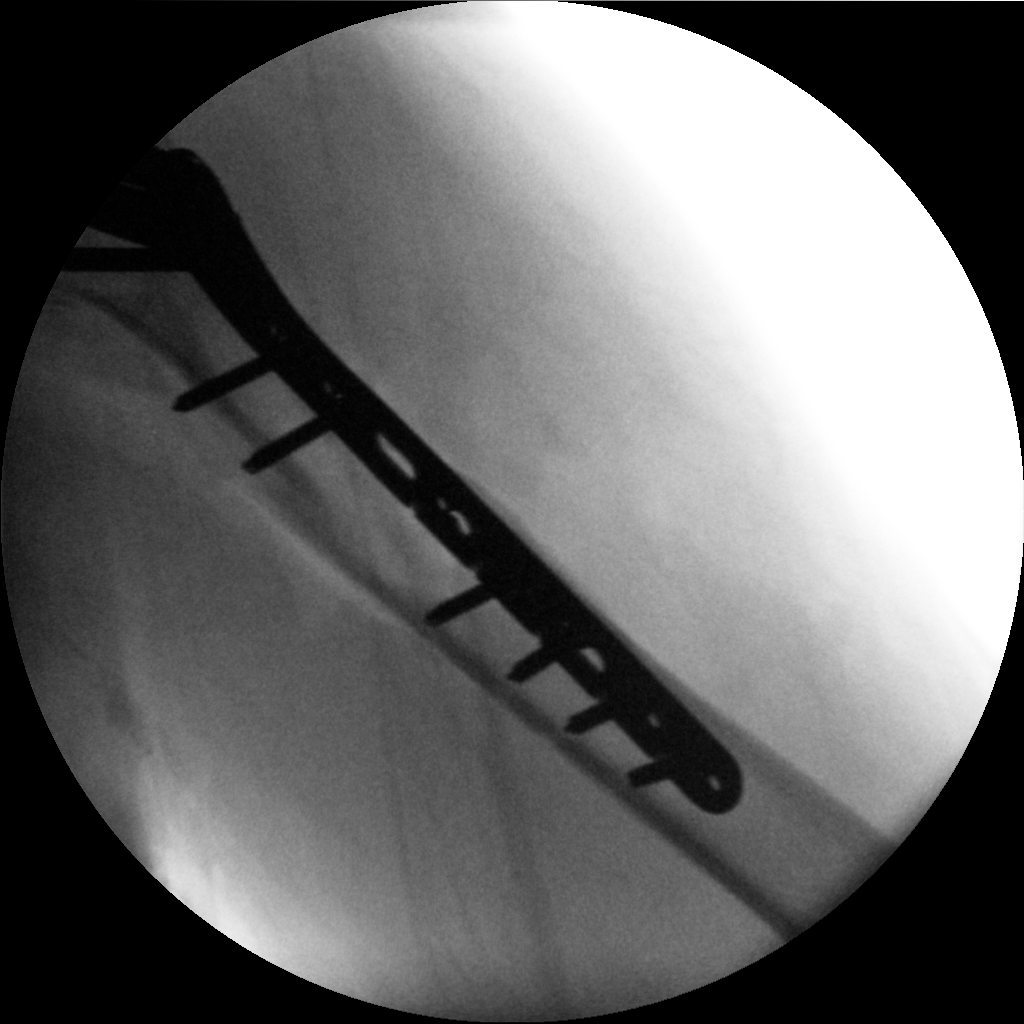
[im 2/3]
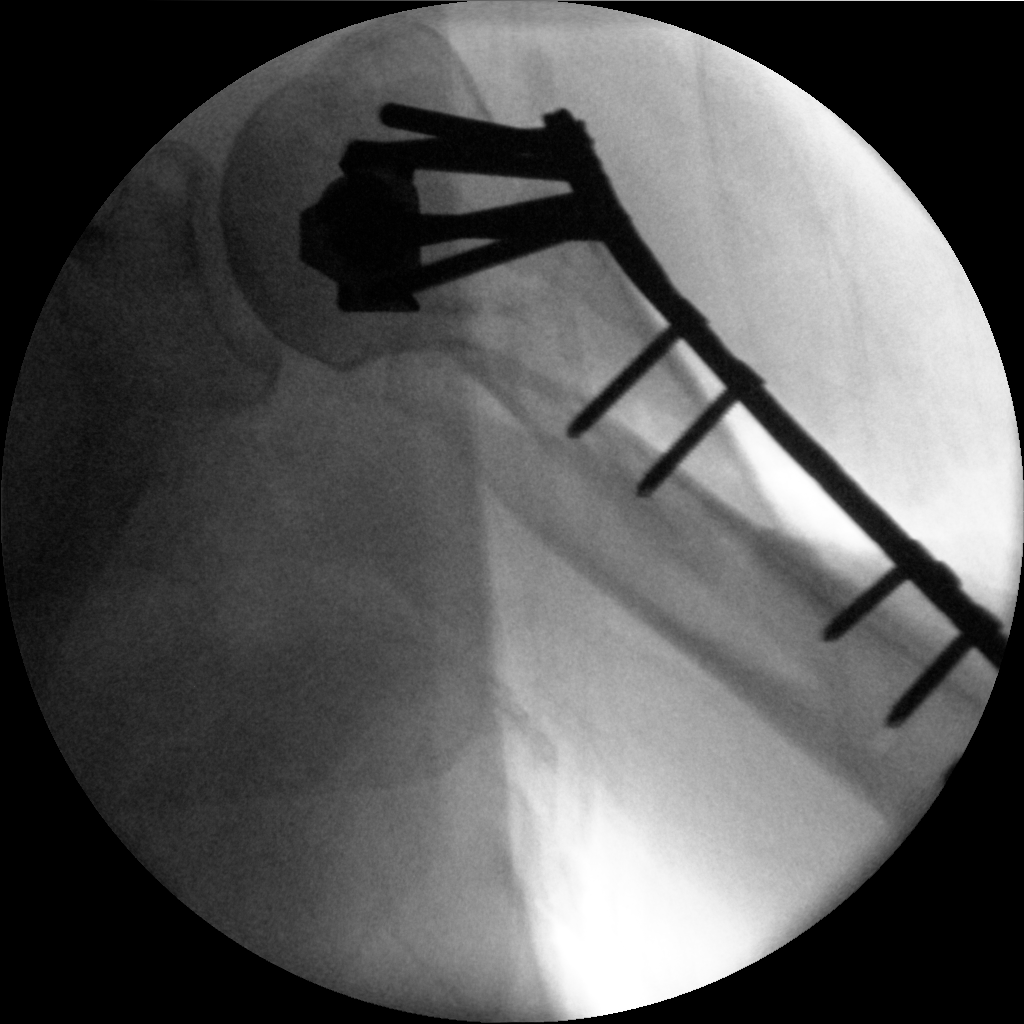
[im 3/3]
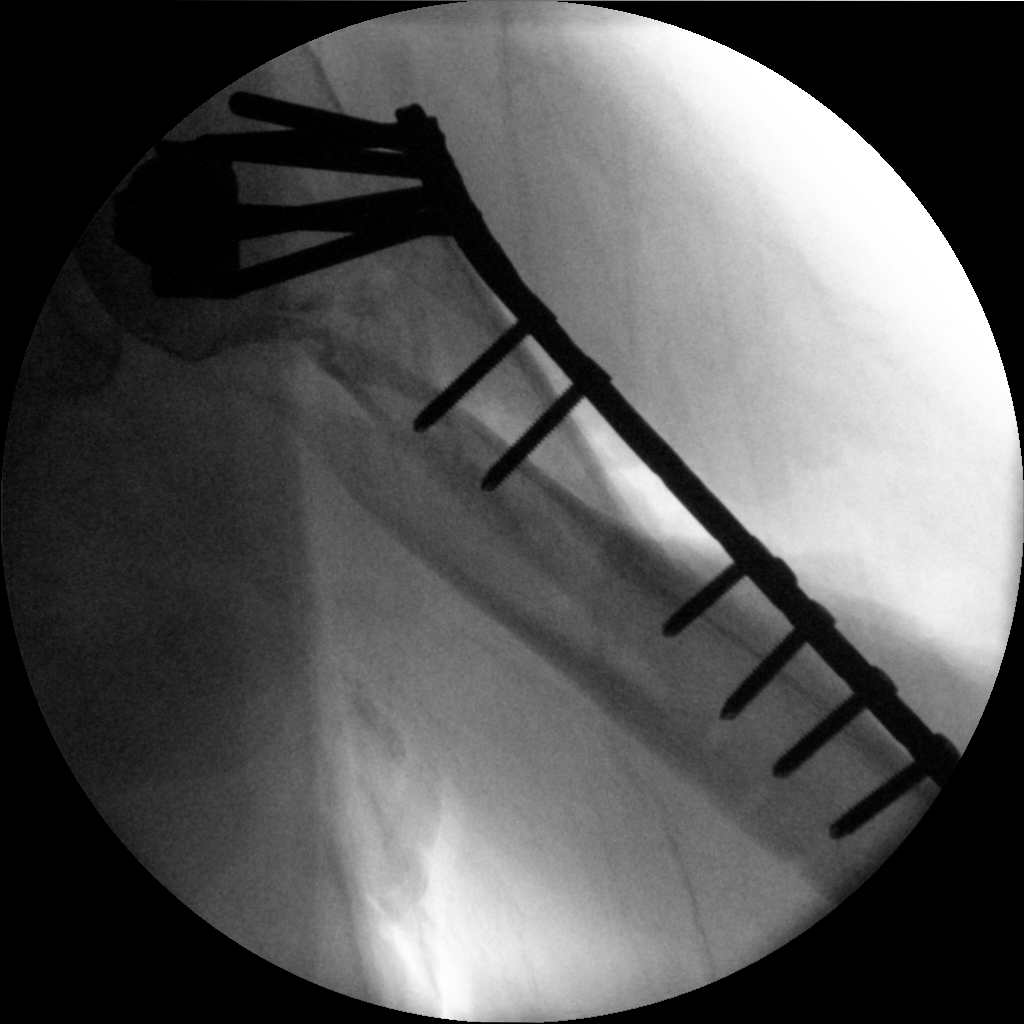

[3 of 3 positions shown; findings below may reference images not displayed]

FINDINGS: Three fluoroscopic images were submitted from intraoperative procedure.
Cortical fixation plate and screws traverse the proximal humerus fracture.
There is a metallic density which overlies the humeral head which is of
uncertain etiology. Clinical correlation is suggested. The remainder of the
interpretation is deferred to the performing clinician.
IMPRESSION: Please see above.

[REDACTED]

## 2012-05-04 ENCOUNTER — Inpatient Hospital Stay: Payer: Self-pay | Admitting: Nurse Practitioner

## 2012-06-21 ENCOUNTER — Inpatient Hospital Stay: Payer: Self-pay | Admitting: Internal Medicine

## 2012-06-21 LAB — URINALYSIS, COMPLETE
Bilirubin,UR: NEGATIVE
Ketone: NEGATIVE
Nitrite: NEGATIVE
RBC,UR: 32 /HPF (ref 0–5)
Specific Gravity: 1.016 (ref 1.003–1.030)
WBC UR: 1 /HPF (ref 0–5)

## 2012-06-21 LAB — CBC
HGB: 14.3 g/dL (ref 13.0–18.0)
MCH: 31.5 pg (ref 26.0–34.0)
MCHC: 32.5 g/dL (ref 32.0–36.0)
MCV: 97 fL (ref 80–100)

## 2012-06-21 LAB — COMPREHENSIVE METABOLIC PANEL
Alkaline Phosphatase: 107 U/L (ref 50–136)
BUN: 16 mg/dL (ref 7–18)
Bilirubin,Total: 0.6 mg/dL (ref 0.2–1.0)
Creatinine: 1.25 mg/dL (ref 0.60–1.30)
EGFR (African American): >60
EGFR (Non-African Amer.): 60 — ABNORMAL LOW
Osmolality: 278 (ref 275–301)
Potassium: 4.3 mmol/L (ref 3.5–5.1)
SGOT(AST): 19 U/L (ref 15–37)
SGPT (ALT): 22 U/L (ref 12–78)

## 2012-06-21 IMAGING — CR DG CHEST 1V PORT
1 series · 1 of 1 positions shown · non-contrast
Comparison: none

REASON FOR EXAM: post intubation
COMMENTS:

[ap]
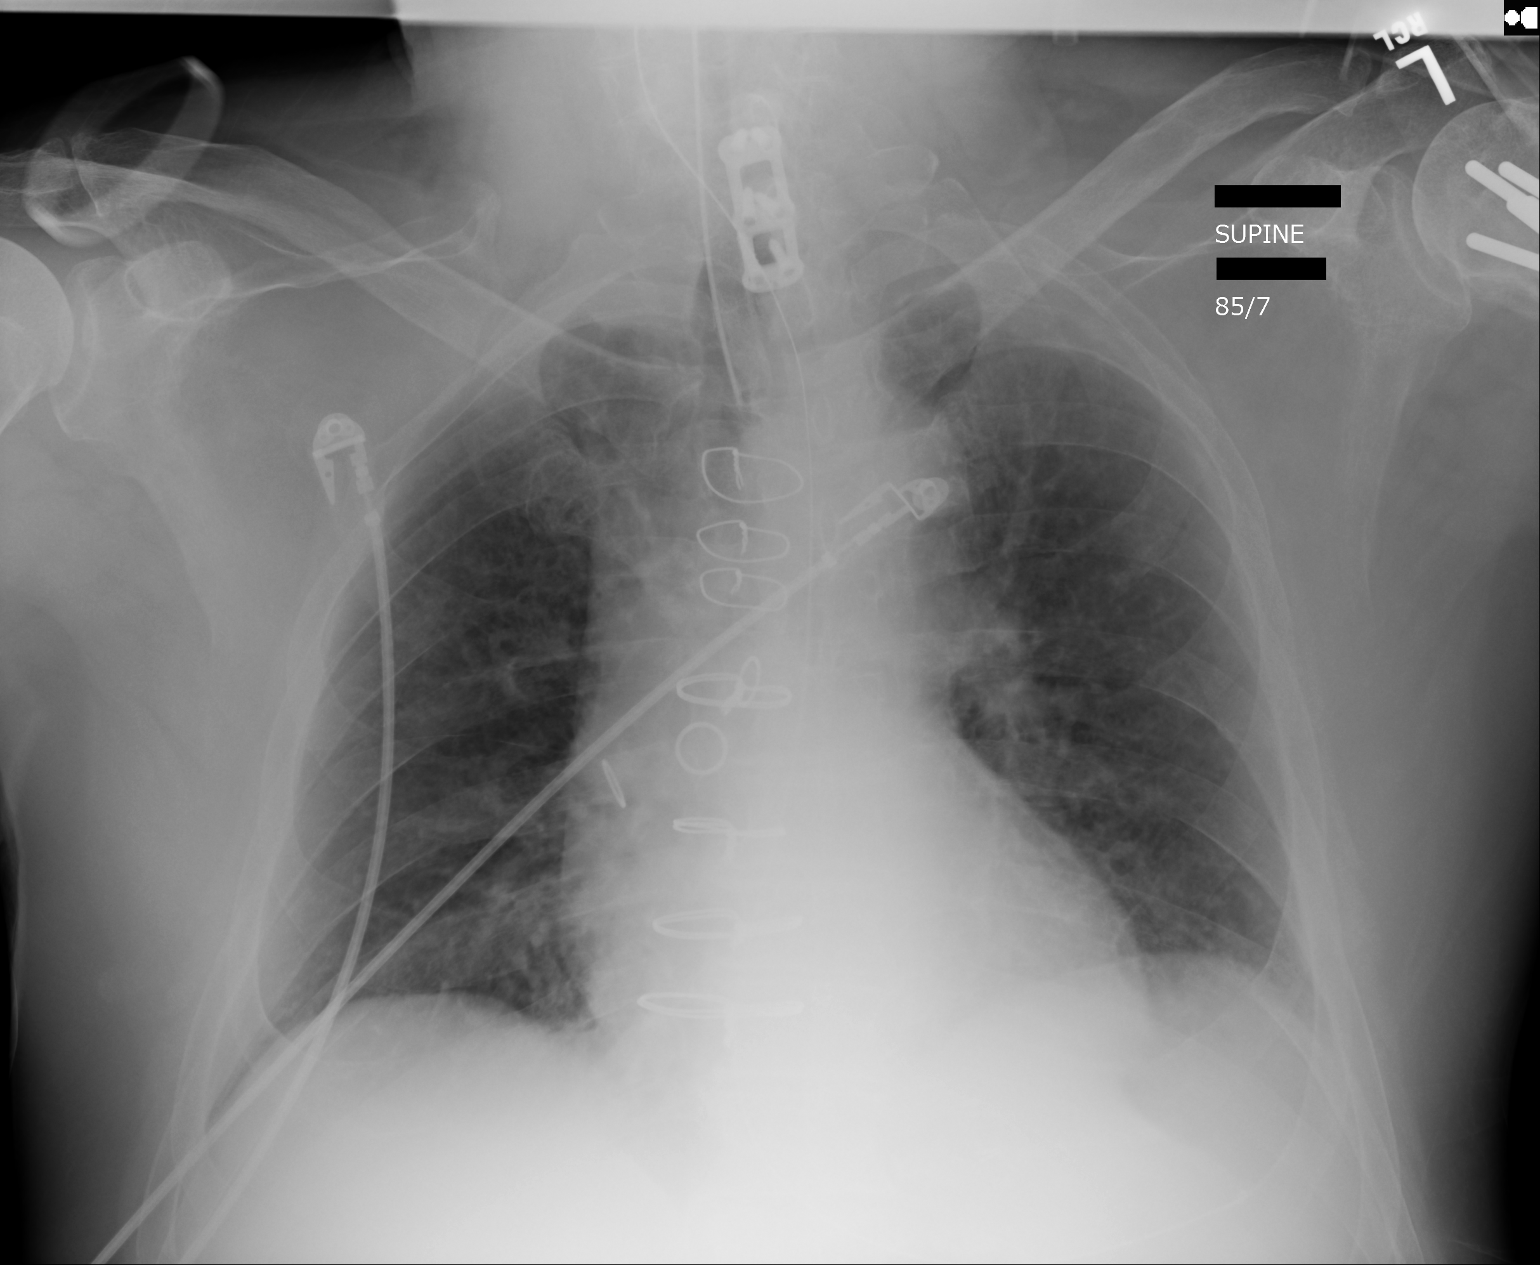

[1 of 1 positions shown; findings below may reference images not displayed]

PROCEDURE:     DXR - DXR PORTABLE CHEST SINGLE VIEW  - [DATE]  [DATE]

RESULT:     Comparison is made to the study [DATE].

The endotracheal tube tip lies at the level of the clavicular heads. An
esophagogastric tube is present but its tip is not clearly visible below the
hemidiaphragms. The cardiac silhouette is not enlarged. The patient has
undergone previous CABG. The interstitial markings are mildly prominent
bilaterally.
IMPRESSION: The patient has undergone interval intubation of the
trachea with the tip the tube at the level of the clavicular heads.

[REDACTED]

## 2012-06-22 LAB — CBC WITH DIFFERENTIAL/PLATELET
Basophil #: 0 10*3/uL (ref 0.0–0.1)
Basophil %: 0.2 %
Eosinophil #: 0 10*3/uL (ref 0.0–0.7)
Eosinophil %: 0.1 %
HCT: 39.5 % — ABNORMAL LOW (ref 40.0–52.0)
Lymphocyte %: 7.2 %
MCH: 33.7 pg (ref 26.0–34.0)
Monocyte #: 0.1 x10 3/mm — ABNORMAL LOW (ref 0.2–1.0)
Monocyte %: 0.9 %
Neutrophil #: 13.7 10*3/uL — ABNORMAL HIGH (ref 1.4–6.5)
Neutrophil %: 91.6 %
Platelet: 248 10*3/uL (ref 150–440)
RBC: 4.05 10*6/uL — ABNORMAL LOW (ref 4.40–5.90)
RDW: 13 % (ref 11.5–14.5)
WBC: 14.9 10*3/uL — ABNORMAL HIGH (ref 3.8–10.6)

## 2012-06-22 LAB — BASIC METABOLIC PANEL
Anion Gap: 10 (ref 7–16)
BUN: 22 mg/dL — ABNORMAL HIGH (ref 7–18)
Calcium, Total: 8.8 mg/dL (ref 8.5–10.1)
Chloride: 107 mmol/L (ref 98–107)
Co2: 19 mmol/L — ABNORMAL LOW (ref 21–32)
Creatinine: 1.37 mg/dL — ABNORMAL HIGH (ref 0.60–1.30)
EGFR (African American): >60
Glucose: 207 mg/dL — ABNORMAL HIGH (ref 65–99)
Osmolality: 281 (ref 275–301)
Potassium: 4.6 mmol/L (ref 3.5–5.1)

## 2012-06-22 LAB — HEMOGLOBIN A1C: Hemoglobin A1C: 5.9 % (ref 4.2–6.3)

## 2012-06-23 LAB — CBC WITH DIFFERENTIAL/PLATELET
Basophil #: 0.1 10*3/uL (ref 0.0–0.1)
Basophil %: 0.8 %
Eosinophil #: 0 10*3/uL (ref 0.0–0.7)
Eosinophil %: 0.1 %
Lymphocyte #: 1.3 10*3/uL (ref 1.0–3.6)
Lymphocyte %: 7.4 %
MCH: 32.4 pg (ref 26.0–34.0)
MCHC: 33 g/dL (ref 32.0–36.0)
MCV: 98 fL (ref 80–100)
Monocyte %: 2.8 %
Neutrophil #: 15.8 10*3/uL — ABNORMAL HIGH (ref 1.4–6.5)
Neutrophil %: 88.9 %
Platelet: 222 10*3/uL (ref 150–440)
RBC: 3.8 10*6/uL — ABNORMAL LOW (ref 4.40–5.90)
WBC: 17.8 10*3/uL — ABNORMAL HIGH (ref 3.8–10.6)

## 2012-06-23 LAB — BASIC METABOLIC PANEL
Calcium, Total: 8.8 mg/dL (ref 8.5–10.1)
Co2: 18 mmol/L — ABNORMAL LOW (ref 21–32)
Creatinine: 1.52 mg/dL — ABNORMAL HIGH (ref 0.60–1.30)
EGFR (African American): 55 — ABNORMAL LOW
EGFR (Non-African Amer.): 47 — ABNORMAL LOW
Potassium: 4.5 mmol/L (ref 3.5–5.1)
Sodium: 138 mmol/L (ref 136–145)

## 2012-06-23 LAB — URINE CULTURE

## 2012-06-24 LAB — BASIC METABOLIC PANEL
Anion Gap: 10 (ref 7–16)
BUN: 34 mg/dL — ABNORMAL HIGH (ref 7–18)
Calcium, Total: 8.2 mg/dL — ABNORMAL LOW (ref 8.5–10.1)
Creatinine: 1.09 mg/dL (ref 0.60–1.30)
EGFR (African American): >60
Glucose: 200 mg/dL — ABNORMAL HIGH (ref 65–99)
Osmolality: 289 (ref 275–301)
Potassium: 4.3 mmol/L (ref 3.5–5.1)
Sodium: 138 mmol/L (ref 136–145)

## 2012-06-24 LAB — CBC WITH DIFFERENTIAL/PLATELET
Eosinophil %: 0 %
HCT: 34.5 % — ABNORMAL LOW (ref 40.0–52.0)
Lymphocyte #: 0.9 10*3/uL — ABNORMAL LOW (ref 1.0–3.6)
Lymphocyte %: 6.1 %
MCHC: 33.9 g/dL (ref 32.0–36.0)
MCV: 96 fL (ref 80–100)
Monocyte #: 0.3 x10 3/mm (ref 0.2–1.0)
Neutrophil #: 13.6 10*3/uL — ABNORMAL HIGH (ref 1.4–6.5)
Neutrophil %: 91.8 %
RBC: 3.59 10*6/uL — ABNORMAL LOW (ref 4.40–5.90)

## 2012-06-25 LAB — BASIC METABOLIC PANEL
Anion Gap: 9 (ref 7–16)
Calcium, Total: 8.4 mg/dL — ABNORMAL LOW (ref 8.5–10.1)
Chloride: 106 mmol/L (ref 98–107)
Co2: 22 mmol/L (ref 21–32)
EGFR (African American): >60
EGFR (Non-African Amer.): >60
Glucose: 142 mg/dL — ABNORMAL HIGH (ref 65–99)
Osmolality: 282 (ref 275–301)

## 2012-06-25 LAB — CBC WITH DIFFERENTIAL/PLATELET
Basophil #: 0 10*3/uL (ref 0.0–0.1)
Basophil %: 0 %
Eosinophil #: 0 10*3/uL (ref 0.0–0.7)
Eosinophil %: 0 %
HCT: 37.7 % — ABNORMAL LOW (ref 40.0–52.0)
HGB: 13 g/dL (ref 13.0–18.0)
Lymphocyte %: 15 %
MCHC: 34.4 g/dL (ref 32.0–36.0)
MCV: 96 fL (ref 80–100)
Monocyte %: 8.4 %
Neutrophil #: 12.5 10*3/uL — ABNORMAL HIGH (ref 1.4–6.5)
Neutrophil %: 76.6 %
Platelet: 216 10*3/uL (ref 150–440)
RBC: 3.95 10*6/uL — ABNORMAL LOW (ref 4.40–5.90)
RDW: 12.9 % (ref 11.5–14.5)
WBC: 16.4 10*3/uL — ABNORMAL HIGH (ref 3.8–10.6)

## 2012-07-08 ENCOUNTER — Inpatient Hospital Stay: Payer: Self-pay | Admitting: Internal Medicine

## 2012-07-08 DIAGNOSIS — R079 Chest pain, unspecified: Secondary | ICD-10-CM

## 2012-07-08 DIAGNOSIS — I4891 Unspecified atrial fibrillation: Secondary | ICD-10-CM

## 2012-07-08 LAB — COMPREHENSIVE METABOLIC PANEL
Albumin: 2.5 g/dL — ABNORMAL LOW (ref 3.4–5.0)
Alkaline Phosphatase: 121 U/L (ref 50–136)
Anion Gap: 12 (ref 7–16)
BUN: 20 mg/dL — ABNORMAL HIGH (ref 7–18)
Bilirubin,Total: 0.7 mg/dL (ref 0.2–1.0)
Chloride: 96 mmol/L — ABNORMAL LOW (ref 98–107)
Co2: 19 mmol/L — ABNORMAL LOW (ref 21–32)
Creatinine: 1.5 mg/dL — ABNORMAL HIGH (ref 0.60–1.30)
EGFR (African American): 55 — ABNORMAL LOW
EGFR (Non-African Amer.): 48 — ABNORMAL LOW
Glucose: 195 mg/dL — ABNORMAL HIGH (ref 65–99)
Osmolality: 263 (ref 275–301)
Potassium: 3.7 mmol/L (ref 3.5–5.1)
SGOT(AST): 95 U/L — ABNORMAL HIGH (ref 15–37)
Sodium: 127 mmol/L — ABNORMAL LOW (ref 136–145)
Total Protein: 7.2 g/dL (ref 6.4–8.2)

## 2012-07-08 LAB — LIPASE, BLOOD: Lipase: 326 U/L (ref 73–393)

## 2012-07-08 LAB — PRO B NATRIURETIC PEPTIDE: B-Type Natriuretic Peptide: 664 pg/mL — ABNORMAL HIGH (ref 0–125)

## 2012-07-08 LAB — CBC
HCT: 39.9 % — ABNORMAL LOW (ref 40.0–52.0)
HGB: 14 g/dL (ref 13.0–18.0)
MCH: 32.6 pg (ref 26.0–34.0)
RBC: 4.3 10*6/uL — ABNORMAL LOW (ref 4.40–5.90)
RDW: 12.9 % (ref 11.5–14.5)
WBC: 5 10*3/uL (ref 3.8–10.6)

## 2012-07-08 LAB — MAGNESIUM: Magnesium: 1.6 mg/dL — ABNORMAL LOW

## 2012-07-08 LAB — PROTIME-INR
INR: 1.1
Prothrombin Time: 14.2 secs (ref 11.5–14.7)

## 2012-07-08 LAB — TROPONIN I: Troponin-I: 0.02 ng/mL

## 2012-07-08 LAB — APTT: Activated PTT: 33.9 secs (ref 23.6–35.9)

## 2012-07-08 IMAGING — US ABDOMEN ULTRASOUND LIMITED
1 series · 14 of 25 positions shown · non-contrast
Comparison: none

REASON FOR EXAM: abnormal LFT's
COMMENTS:   Body Site: Right Upper Quad; Liver

PROCEDURE:     US  - US ABDOMEN LIMITED SURVEY  - [DATE] [DATE]
RESULT:

[Series 1: abdomen ultrasound limited · 0.31mm/px · 14 of 43 slices shown]
[im 1/43]
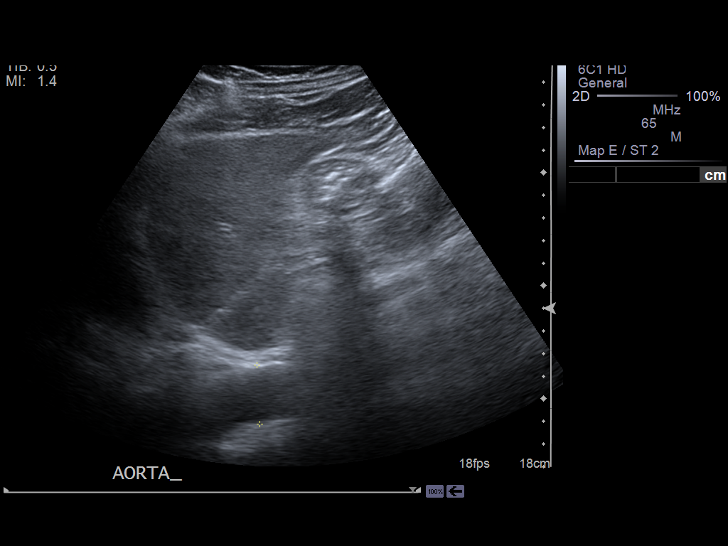
[im 4/43]
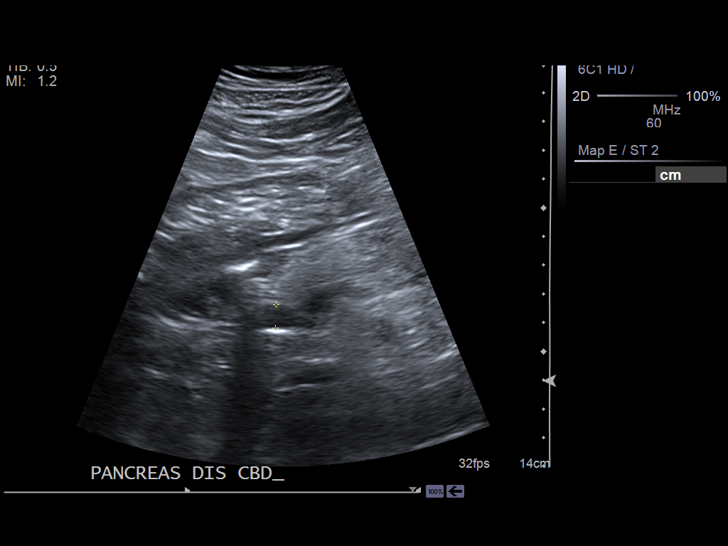
[im 8/43]
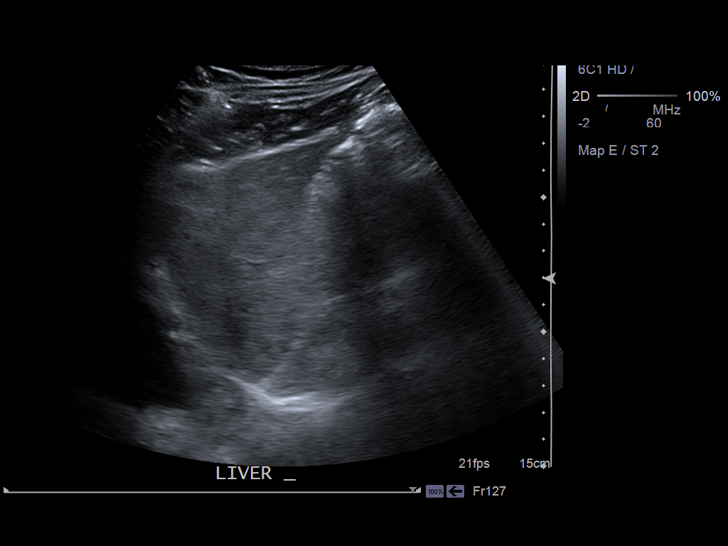
[im 11/43]
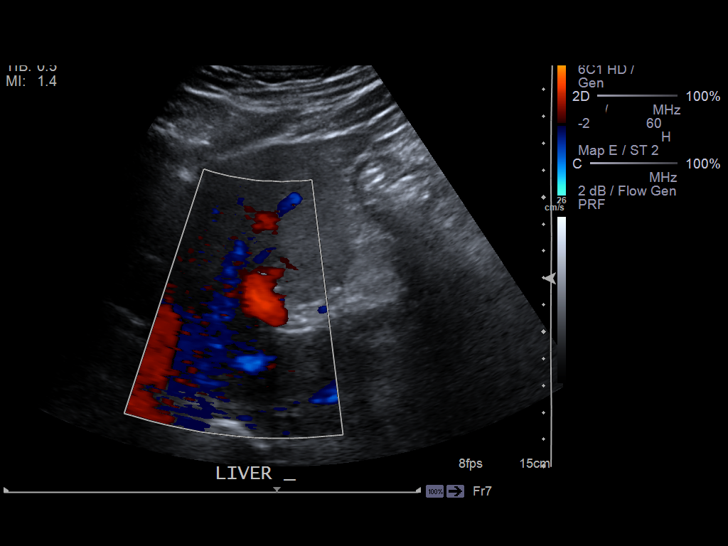
[im 15/43]
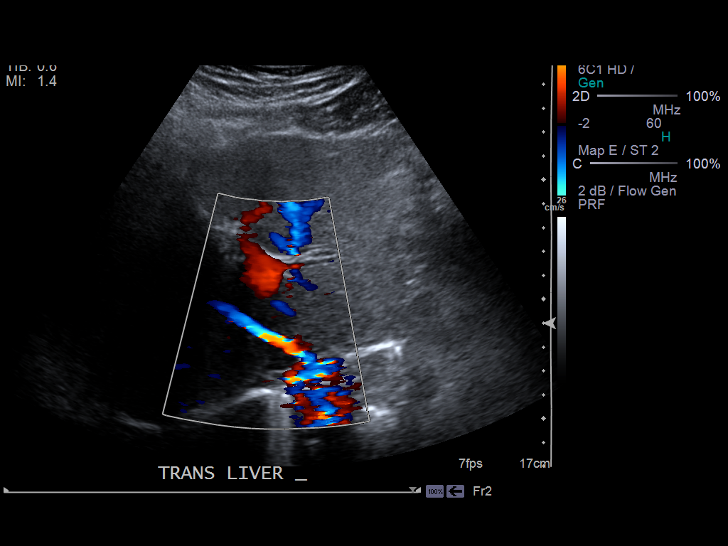
[im 16/43]
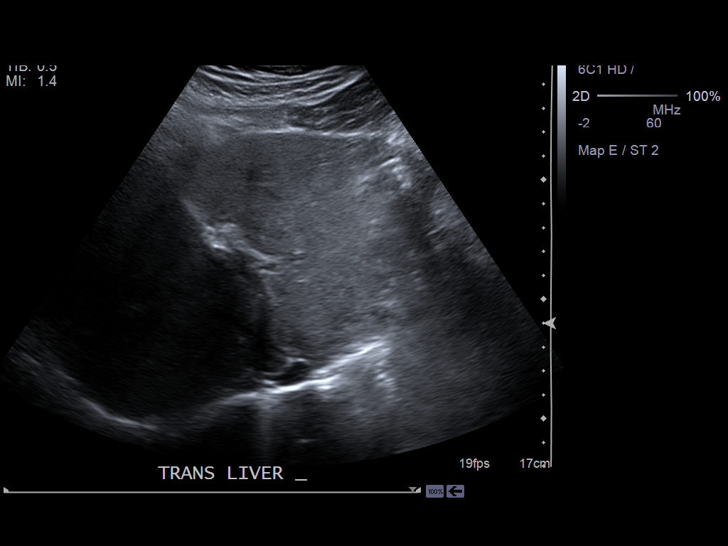
[im 20/43]
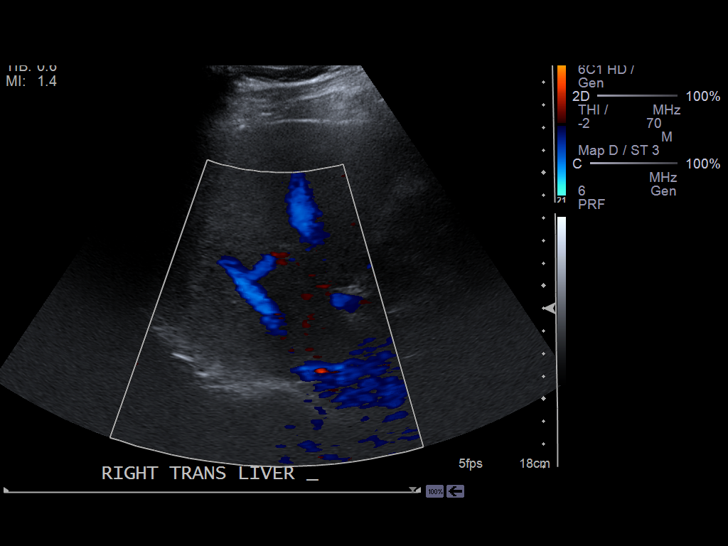
[im 23/43]
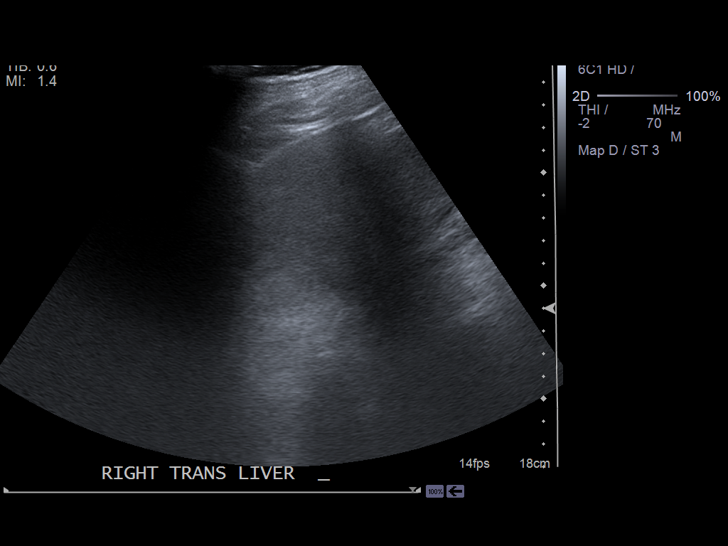
[im 27/43]
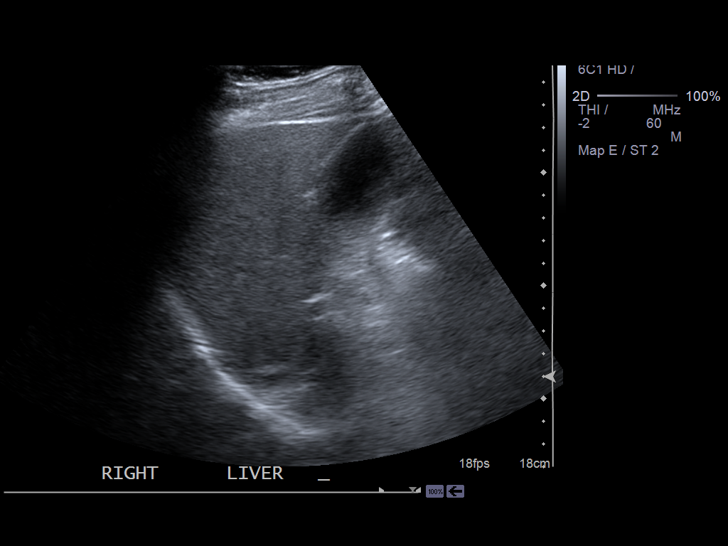
[im 29/43]
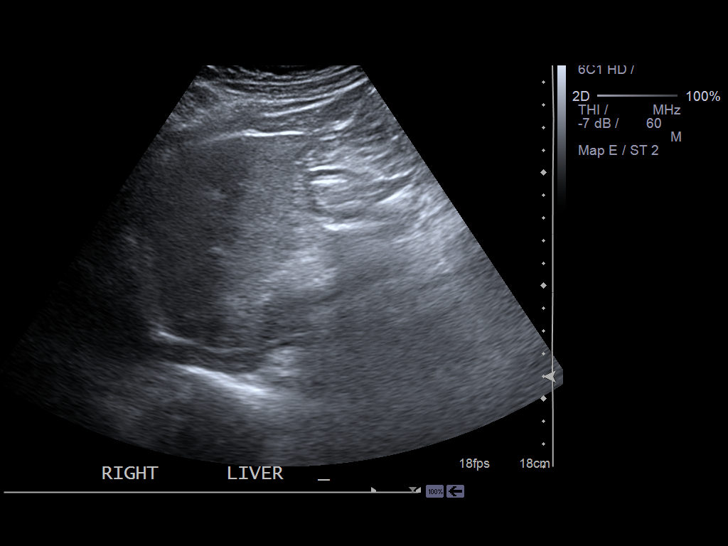
[im 32/43]
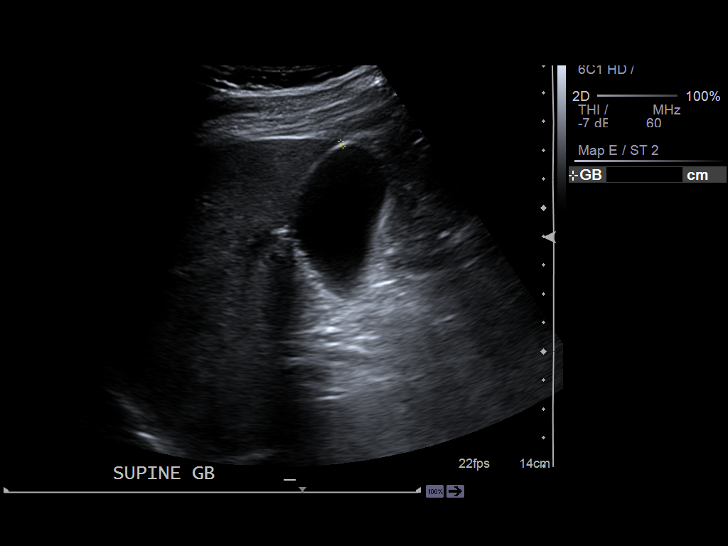
[im 36/43]
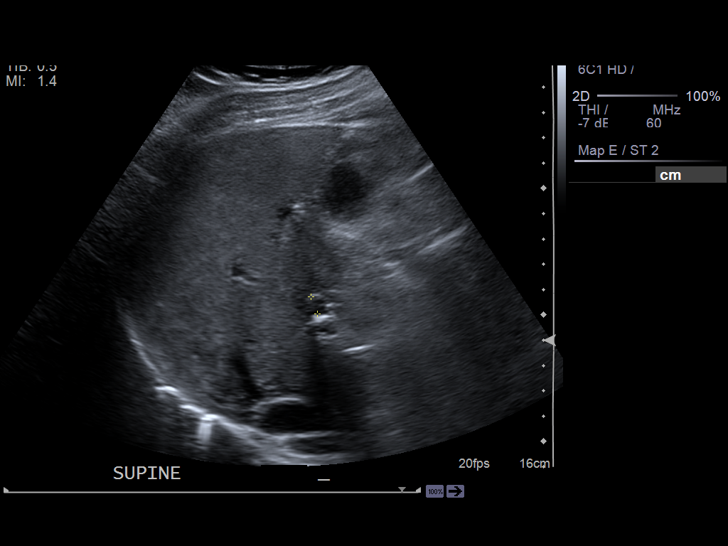
[im 39/43]
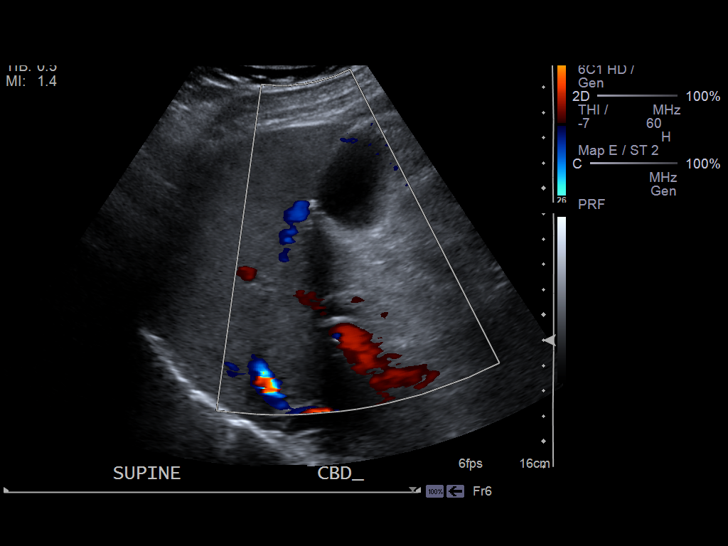
[im 43/43]
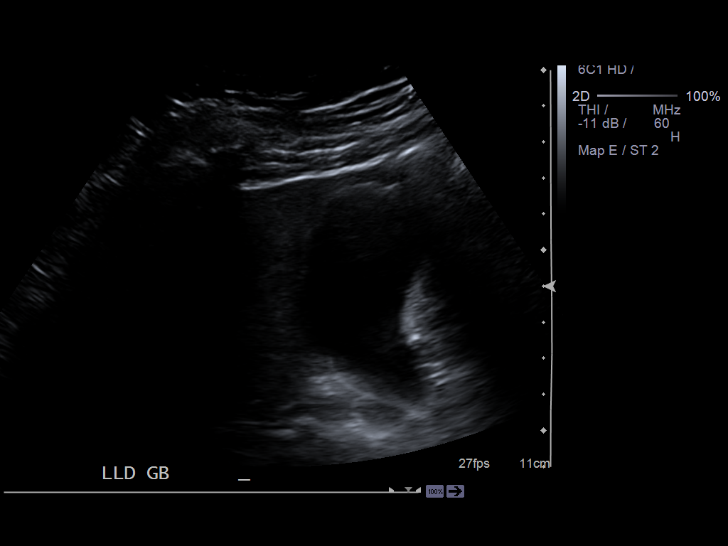

[14 of 25 positions shown; findings below may reference images not displayed]

FINDINGS: Limited right upper quadrant Abdominal Ultrasound shows the liver
length is 14.15 cm. The common bile duct diameter is 8.2 mm. No gallstones
are evident. The gallbladder wall thickness is 1.7 mm. There is limited
visualization of the pancreas without gross abnormality. The liver
echotexture is echogenic without a focal mass or intrahepatic ductal
dilation. No ascites is evident. Portal venous flow is normal.
IMPRESSION: 1.     Common bile duct distention up to as much as 8.2 mm. There is no
evidence of cholelithiasis. No ascites or abnormal fluid collection is seen.
2.     Hepatic steatosis is likely present.

[REDACTED]

## 2012-07-08 IMAGING — US US EXTREM LOW VENOUS BILAT
1 series · 14 of 24 positions shown · non-contrast
Comparison: none

REASON FOR EXAM: elevated D-dimer
COMMENTS:

[Series 1: us extrem low venous bilat · 0.09mm/px · 14 of 55 slices shown]
[im 1/55]
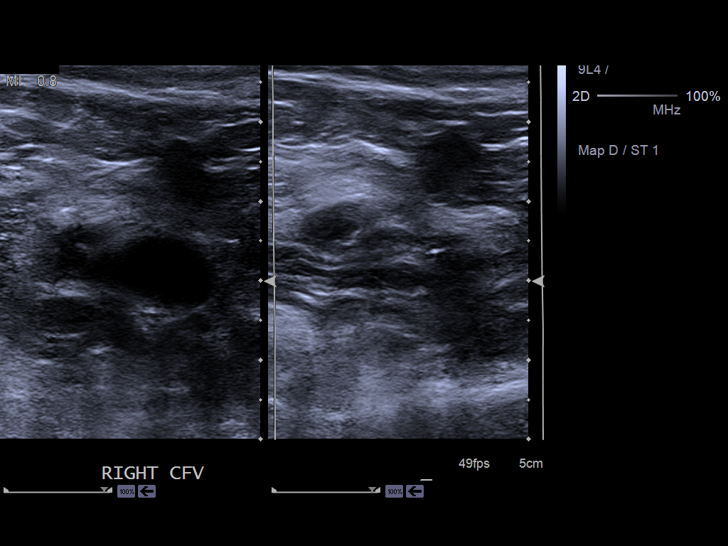
[im 5/55]
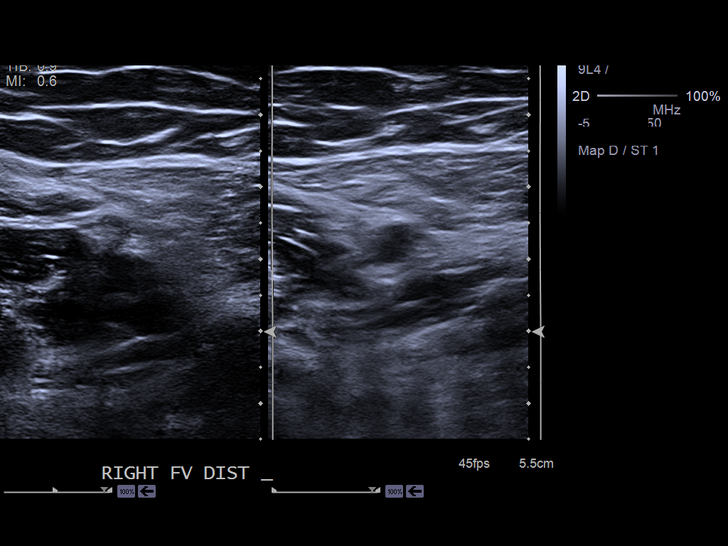
[im 10/55]
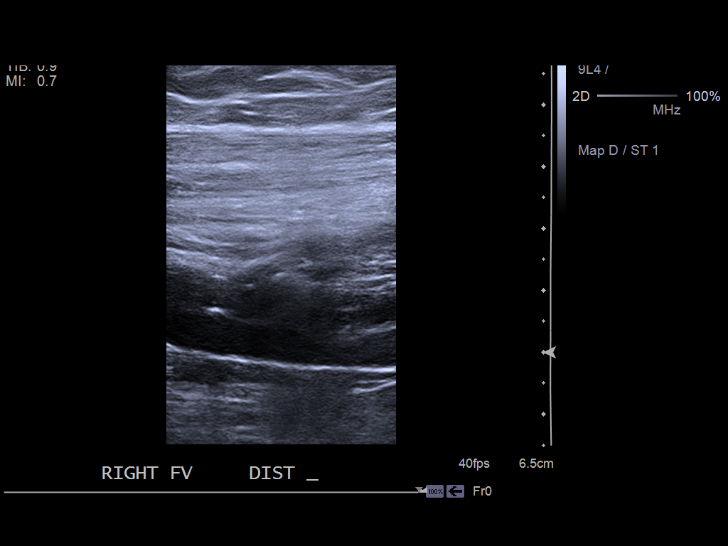
[im 15/55]
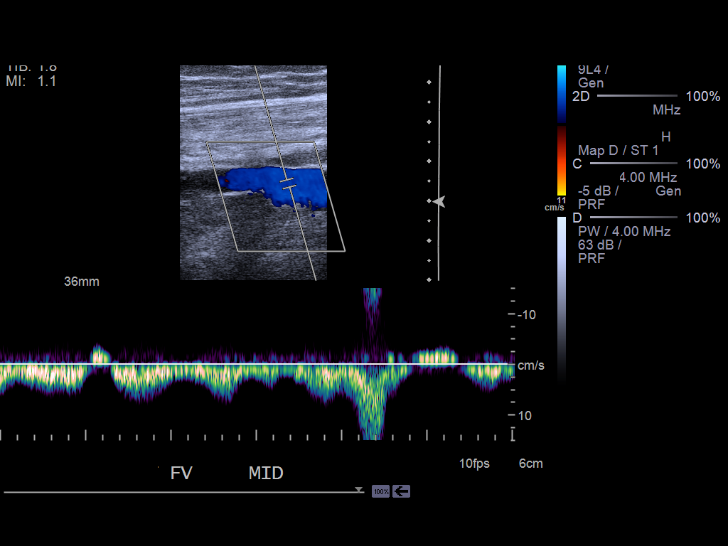
[im 17/55]
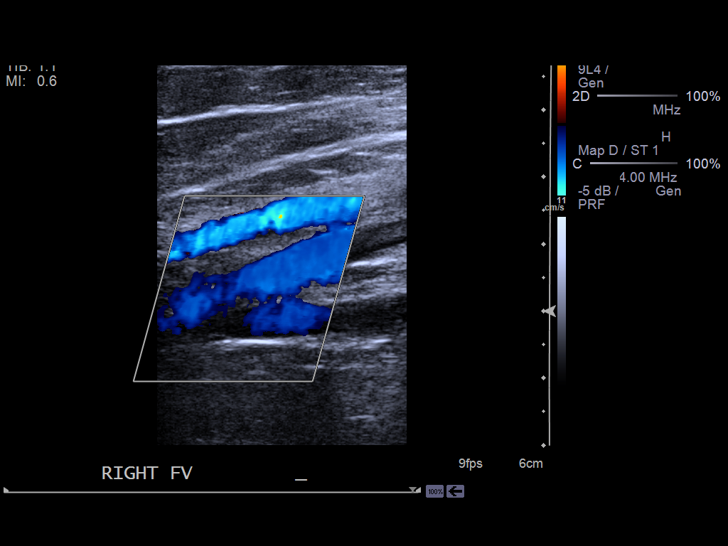
[im 22/55]
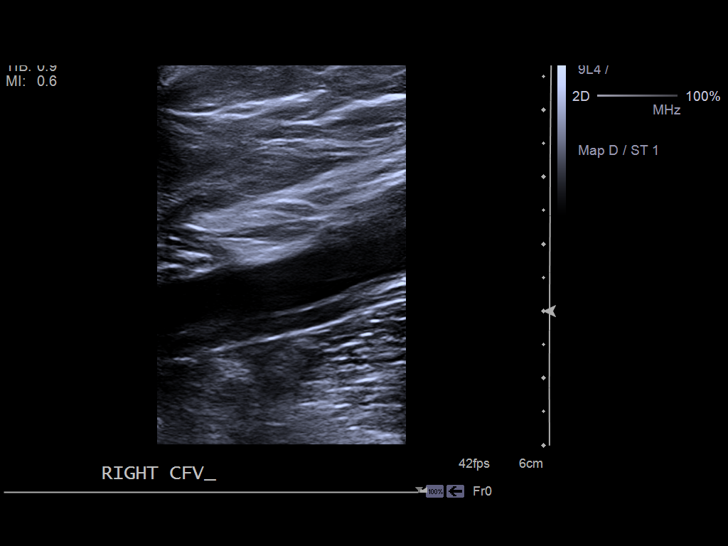
[im 26/55]
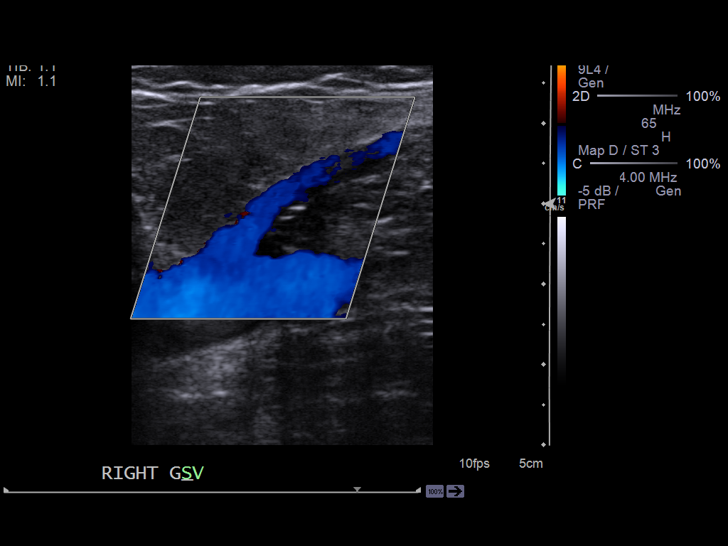
[im 29/55]
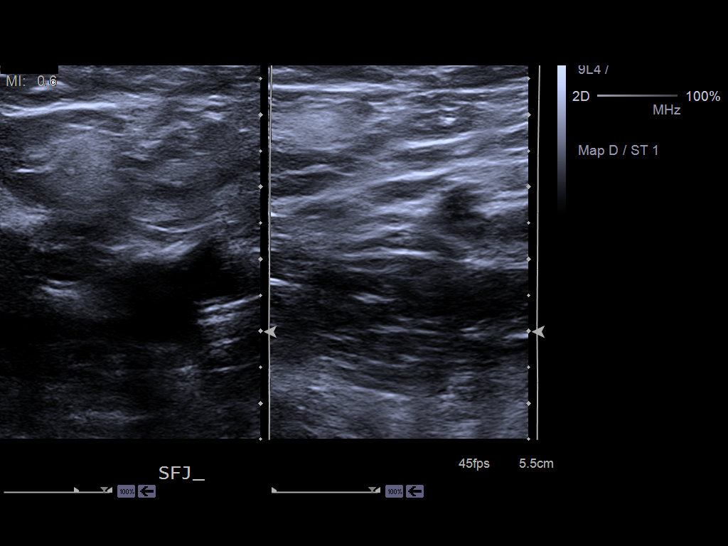
[im 33/55]
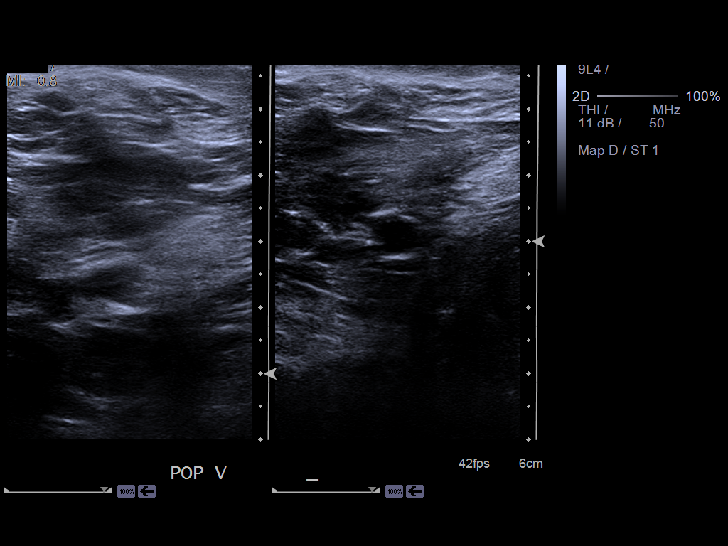
[im 38/55]
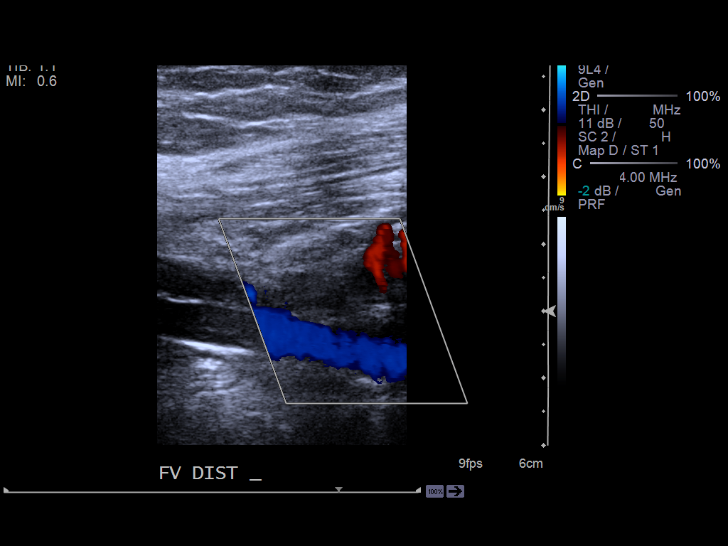
[im 43/55]
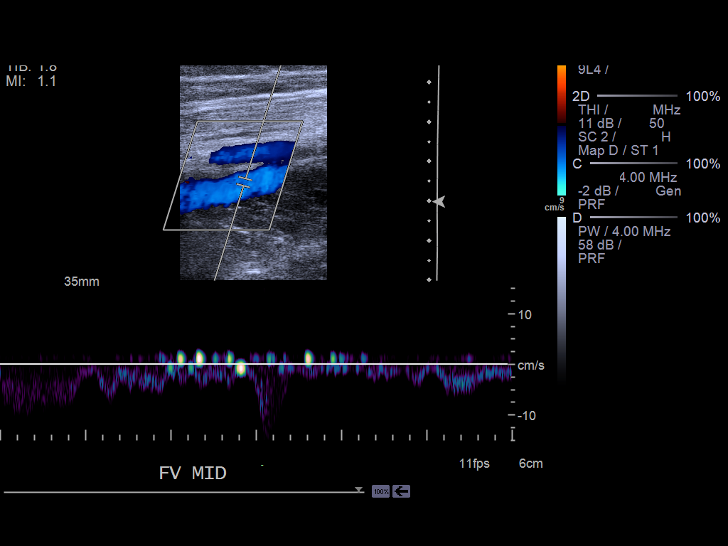
[im 45/55]
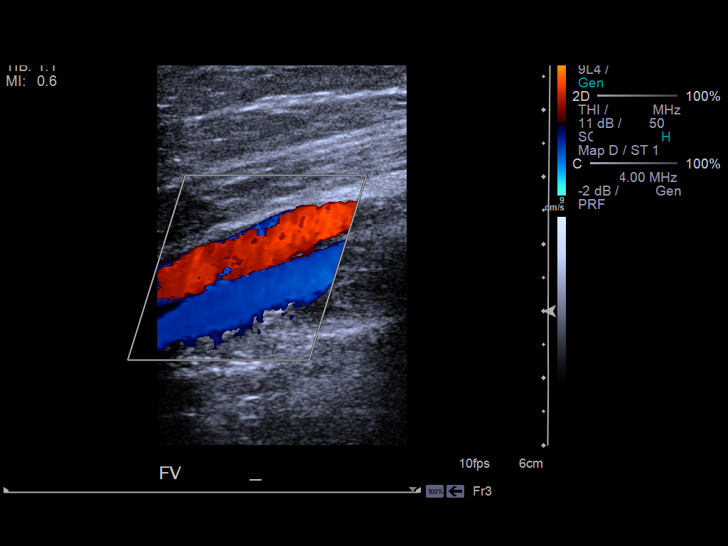
[im 50/55]
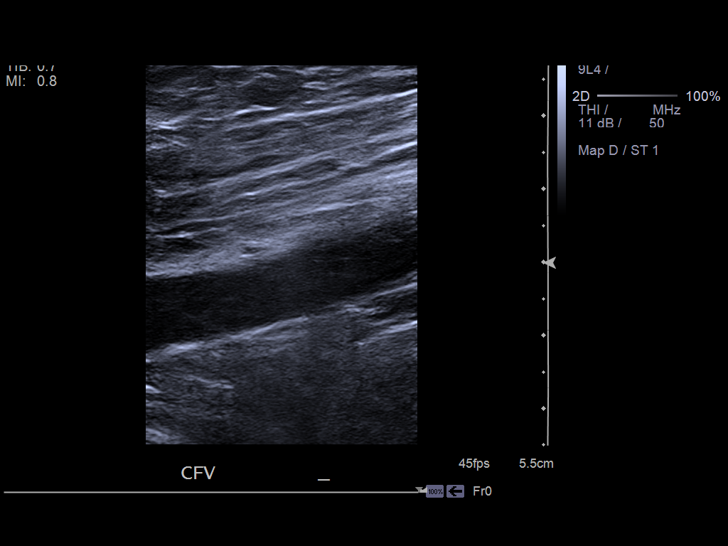
[im 55/55]
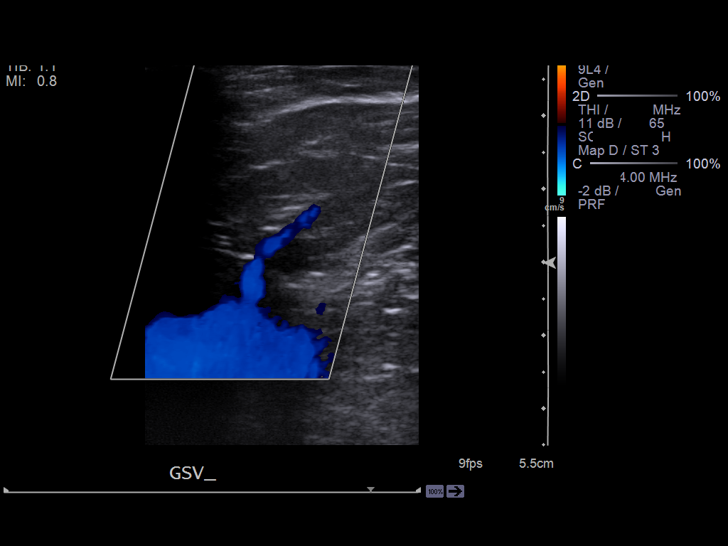

[14 of 24 positions shown; findings below may reference images not displayed]

PROCEDURE:     US  - US DOPPLER LOW EXTR BILATERAL  - [DATE] [DATE]

RESULT:     Duplex Doppler interrogation of the deep venous system of both
legs from the inguinal to the popliteal region demonstrates the deep venous
systems are fully compressible throughout. The color Doppler and spectral
Doppler appearance is normal. There is normal response to distal
augmentation. The color Doppler images show no filling defect.
IMPRESSION: 1. No evidence of DVT in either lower extremity.

[REDACTED]

## 2012-07-08 IMAGING — NM NM LUNG SCAN
2 series · 12 of 12 positions shown · non-contrast
Comparison: none

REASON FOR EXAM: elevated d-dimer, cp
COMMENTS:

[Series 1000: perfusion · 1.95mm/px · 3 acquisitions, 6 frames shown]
[im 1/3]
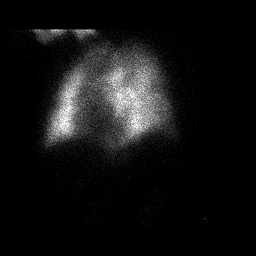
[im 1/3]
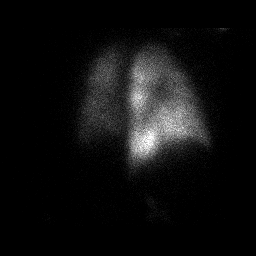
[im 2/3]
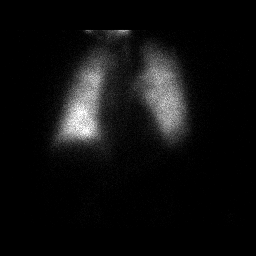
[im 2/3]
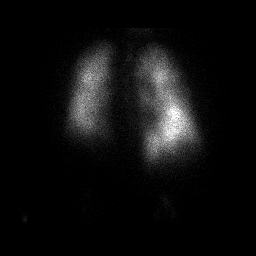
[im 3/3]
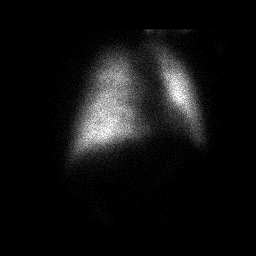
[im 3/3]
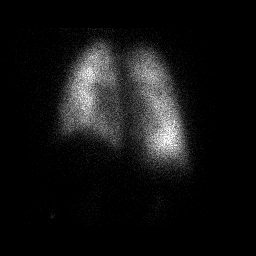

[Series 1000: ventilation · 3.90mm/px · 3 acquisitions, 6 frames shown]
[im 1/3]
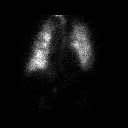
[im 1/3]
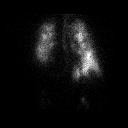
[im 2/3]
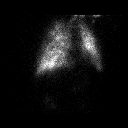
[im 2/3]
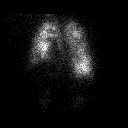
[im 3/3]
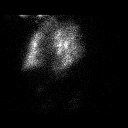
[im 3/3]
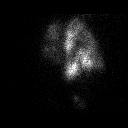

[12 of 12 positions shown; findings below may reference images not displayed]

PROCEDURE:     NM  - NM VQ LUNG SCAN  - [DATE] [DATE] [DATE]  [DATE]

RESULT:     Radiograph of the chest shows CABG changes are present. There is
elevation of the left hemidiaphragm. There may be a small left pleural
effusion. Study is from [DATE] at [DATE] a.m. The blunting of the
left costophrenic angle was new compared to an earlier study from [DATE].

The patient received an injection of 4.17 mCi of technetium 99 M MAA for
perfusion images. 43.22 mCi of technetium 99 M DTPA were placed in the
nebulizer for the ventilation images.

Ventilation images show extensive abnormal ventilatory defects in both lungs
with diffusely decreased localization bilaterally. There is essentially no
activity in the left lower lobe which could represent ventilatory
obstruction such as mucous plugging or mass. There are subsegmental and
possibly segmental defects elsewhere in the left upper lobe and right lung.

The perfusion pattern almost exactly correlates with the ventilation pattern
showing significant subsegmental, segmental and left lower lobar perfusion
defects.
IMPRESSION: Abnormal ventilation/perfusion studies. Large areas of
ventilation and perfusion defect. Pulmonary embolism is not excluded. The
possibility of pulmonary embolism is intermediate to high. The patient
should undergo contrast-enhanced CT a if at all possible. Recent lower
extremity Doppler was negative for DVT.

[REDACTED](*)

## 2012-07-08 IMAGING — CR DG CHEST 1V PORT
1 series · 1 of 1 positions shown · non-contrast
Comparison: none

REASON FOR EXAM: cp
COMMENTS:

PROCEDURE:     DXR - DXR PORTABLE CHEST SINGLE VIEW  - [DATE]  [DATE]
RESULT:     Comparison made to prior study [DATE]. Interim removal of NG
tube and endotracheal tube. Prior CABG. Cervical spine fusion. Left lower
lobe atelectasis and infiltrate. No pulmonary venous congestion.

[ap]
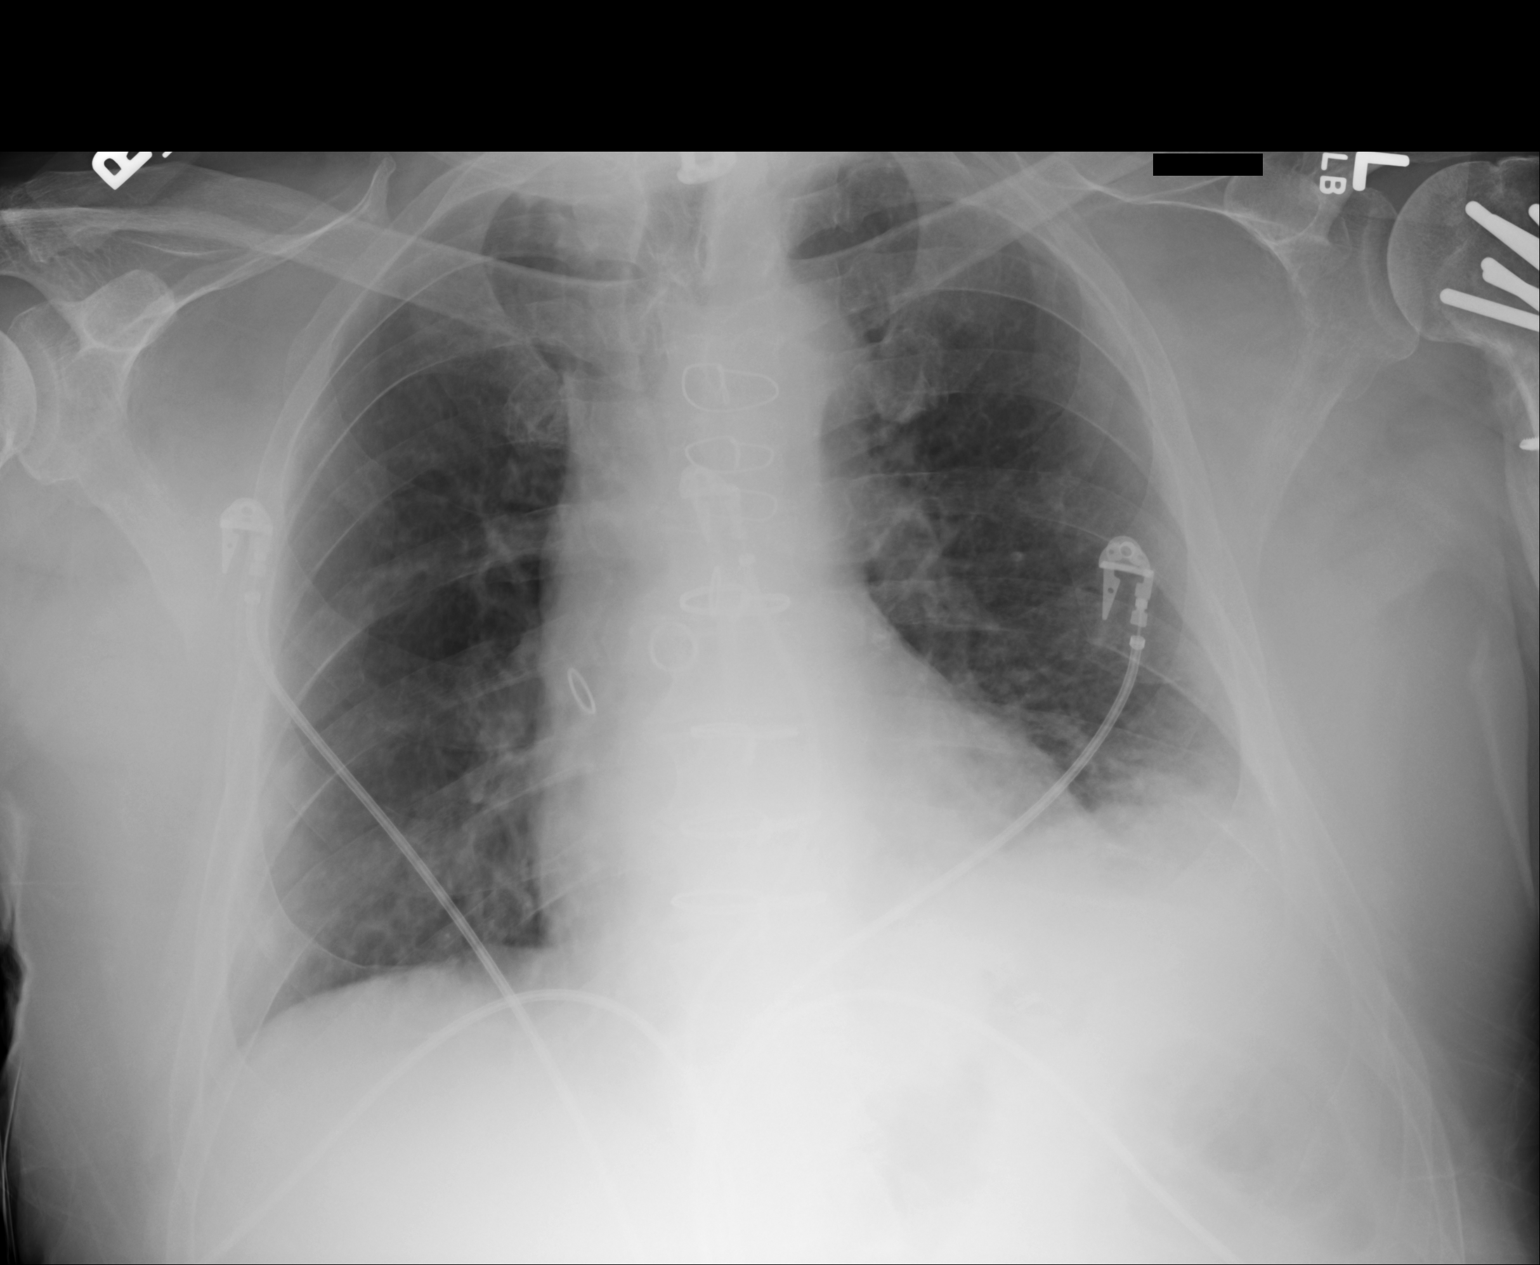

[1 of 1 positions shown; findings below may reference images not displayed]

IMPRESSION: Left lower lobe atelectasis and/or infiltrate. Interim
removal of endotracheal tube and NG tube from prior chest x-ray of [DATE].
CABG. Heart size minimal enlarged. No pulmonary venous congestion.

## 2012-07-09 DIAGNOSIS — I4891 Unspecified atrial fibrillation: Secondary | ICD-10-CM

## 2012-07-09 LAB — BASIC METABOLIC PANEL
BUN: 16 mg/dL (ref 7–18)
EGFR (African American): >60
EGFR (Non-African Amer.): >60
Glucose: 138 mg/dL — ABNORMAL HIGH (ref 65–99)
Osmolality: 270 (ref 275–301)
Potassium: 3.5 mmol/L (ref 3.5–5.1)
Sodium: 133 mmol/L — ABNORMAL LOW (ref 136–145)

## 2012-07-09 LAB — DRUG SCREEN, URINE
Amphetamines, Ur Screen: NEGATIVE (ref ?–1000)
Barbiturates, Ur Screen: NEGATIVE (ref ?–200)
Benzodiazepine, Ur Scrn: NEGATIVE (ref ?–200)
Cannabinoid 50 Ng, Ur ~~LOC~~: NEGATIVE (ref ?–50)
Cocaine Metabolite,Ur ~~LOC~~: NEGATIVE (ref ?–300)
Methadone, Ur Screen: NEGATIVE (ref ?–300)
Opiate, Ur Screen: POSITIVE (ref ?–300)
Tricyclic, Ur Screen: NEGATIVE (ref ?–1000)

## 2012-07-09 LAB — CBC WITH DIFFERENTIAL/PLATELET
Basophil %: 0.6 %
Eosinophil %: 0.1 %
Lymphocyte #: 1.2 10*3/uL (ref 1.0–3.6)
Lymphocyte %: 26.1 %
MCH: 32.9 pg (ref 26.0–34.0)
Monocyte #: 0.5 x10 3/mm (ref 0.2–1.0)
Monocyte %: 10.7 %
Neutrophil #: 2.9 10*3/uL (ref 1.4–6.5)
Neutrophil %: 62.5 %
Platelet: 118 10*3/uL — ABNORMAL LOW (ref 150–440)
RBC: 4.23 10*6/uL — ABNORMAL LOW (ref 4.40–5.90)
RDW: 12.9 % (ref 11.5–14.5)
WBC: 4.7 10*3/uL (ref 3.8–10.6)

## 2012-07-09 LAB — MAGNESIUM: Magnesium: 1.7 mg/dL — ABNORMAL LOW

## 2012-07-09 IMAGING — CR DG CHEST 1V PORT
1 series · 1 of 1 positions shown · non-contrast
Comparison: none

REASON FOR EXAM: central line placement
COMMENTS:

PROCEDURE:     DXR - DXR PORTABLE CHEST SINGLE VIEW  - [DATE]  [DATE]
RESULT:     Frontal view of the chest dated [DATE] comparison to prior
study dated [DATE]

[ap]
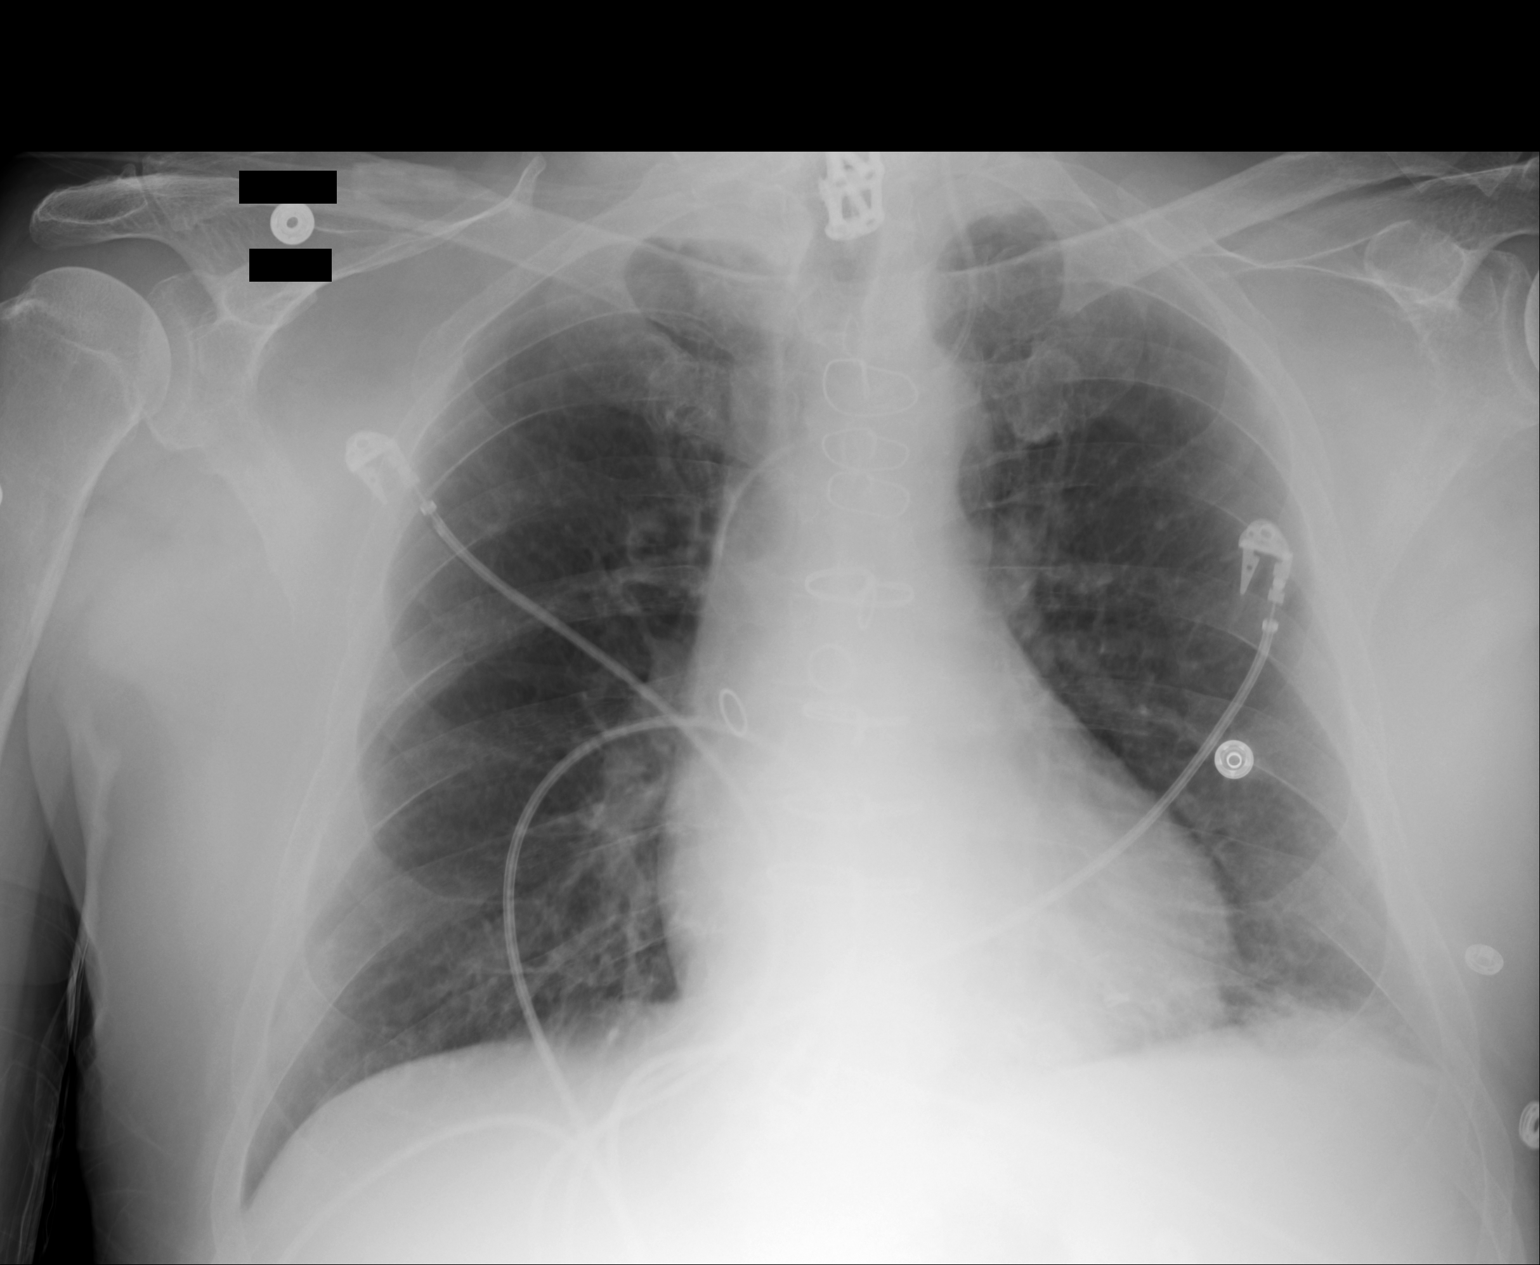

[1 of 1 positions shown; findings below may reference images not displayed]

FINDINGS: A left-sided central venous catheter has been inserted with tip
projecting regions appear vena cava. Without evidence of a pneumothorax.
Patient's taken aspiration. Vague area of increased density projects within
the left lung base. Cardiac silhouette and visualized bony skeleton
unremarkable.
IMPRESSION: Left-sided central venous catheter as described above.
2. Atelectasis versus infiltrate left lung base.

## 2012-07-09 IMAGING — CT CT CHEST W/ CM
1 of 2 series · 14 of 32 positions shown, 18 images · IV contrast (APPLIED)
Comparison: none

REASON FOR EXAM: Elevated d-dimer, hypoxia, abnormal V/Q Scan
COMMENTS:

[Series 5: lung windows · axial · 0.76mm/px · z∈[-376,-58]mm · 14 of 126 slices shown, 18 images]
[im 10/126  mediastinal]
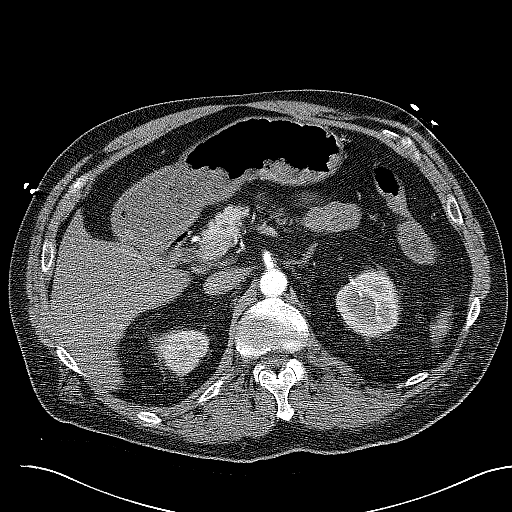
[im 10/126  lung]
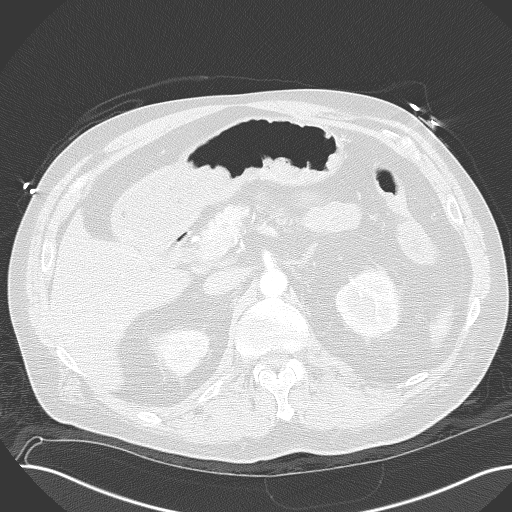
[im 20/126  lung]
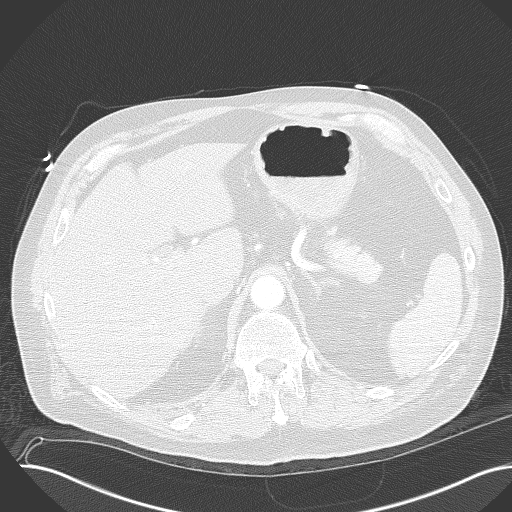
[im 29/126  lung]
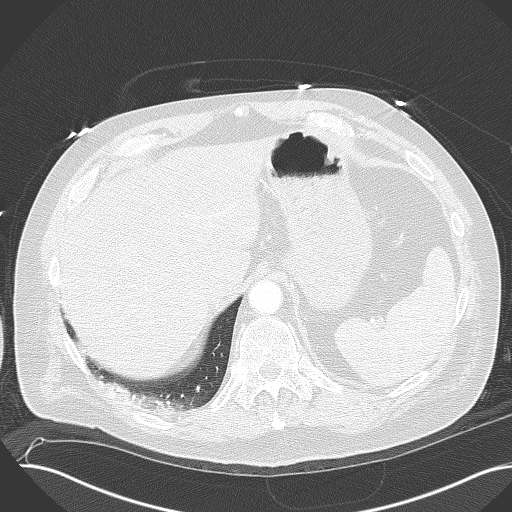
[im 39/126  lung]
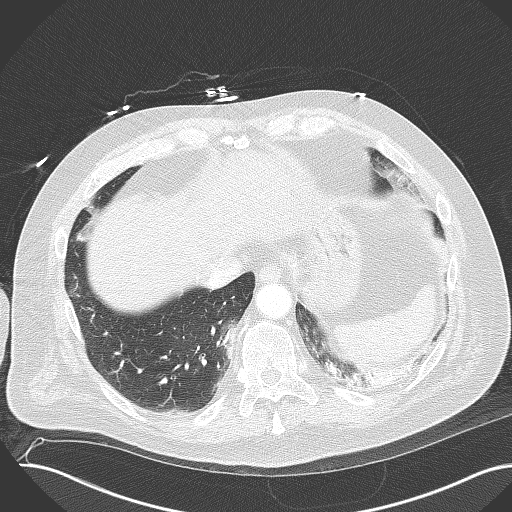
[im 49/126  mediastinal]
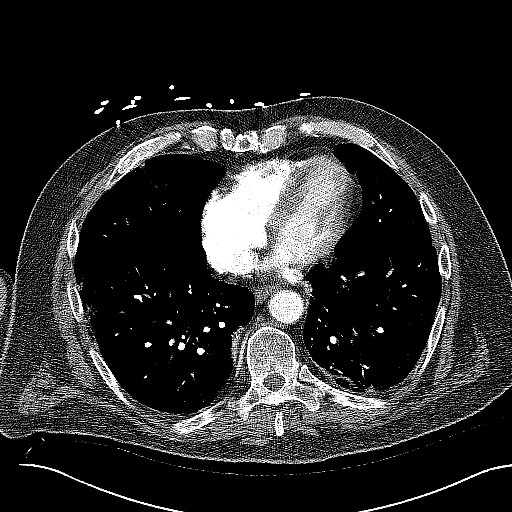
[im 49/126  lung]
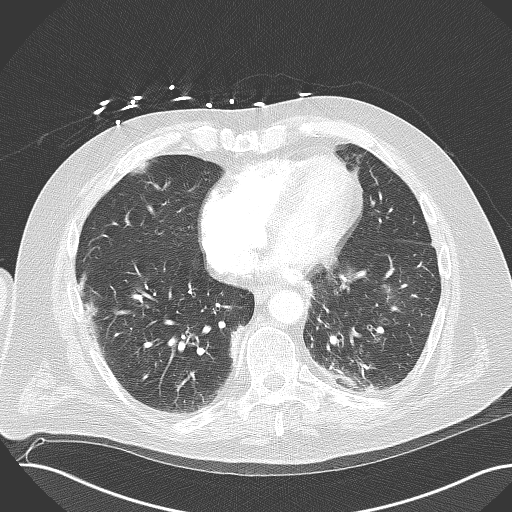
[im 58/126  lung]
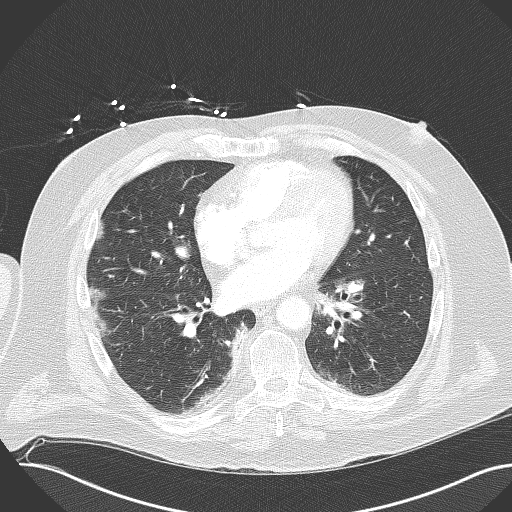
[im 59/126  lung]
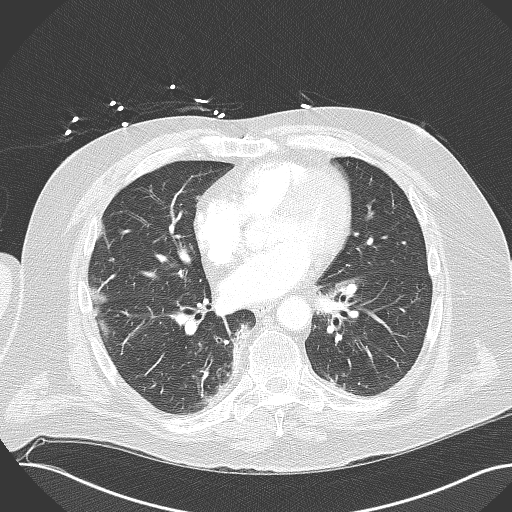
[im 63/126  lung]
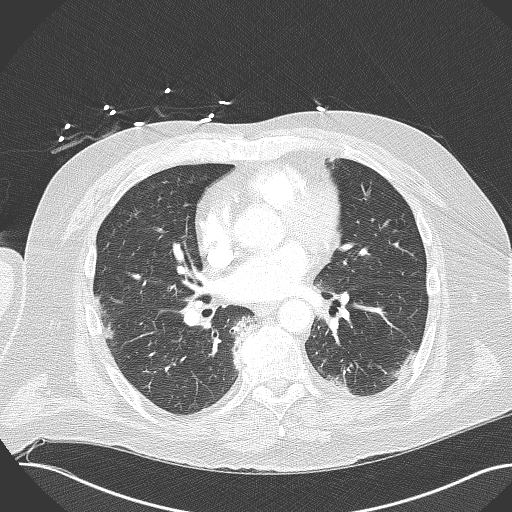
[im 68/126  mediastinal]
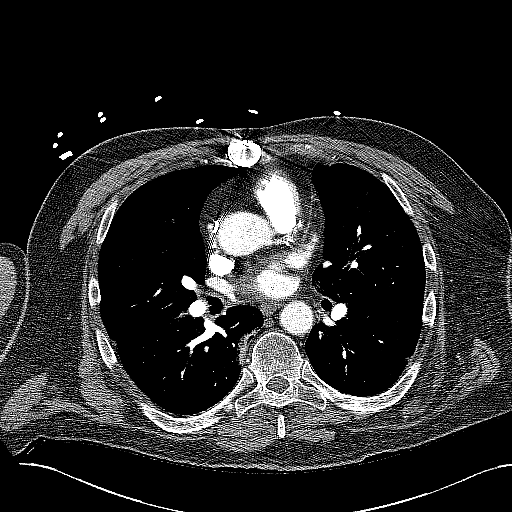
[im 68/126  lung]
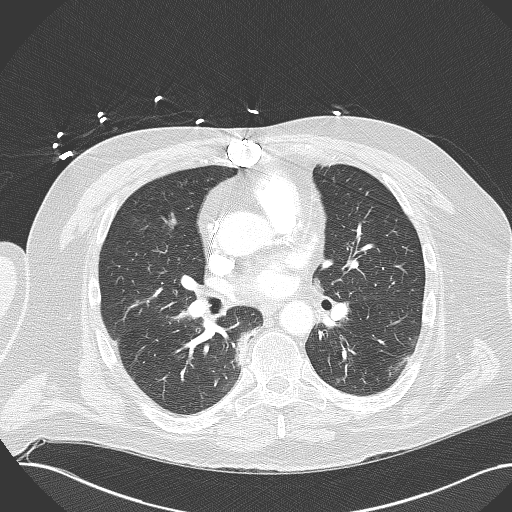
[im 77/126  lung]
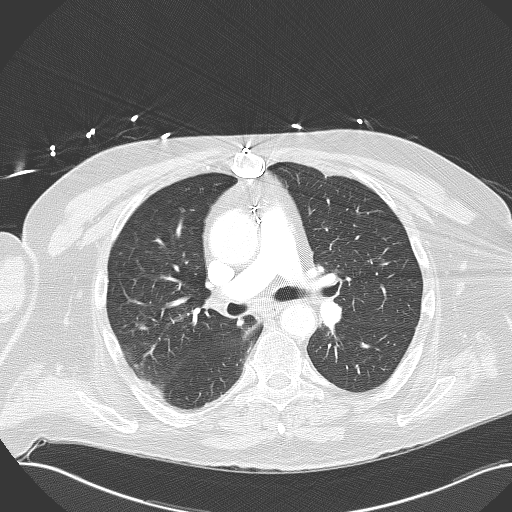
[im 87/126  lung]
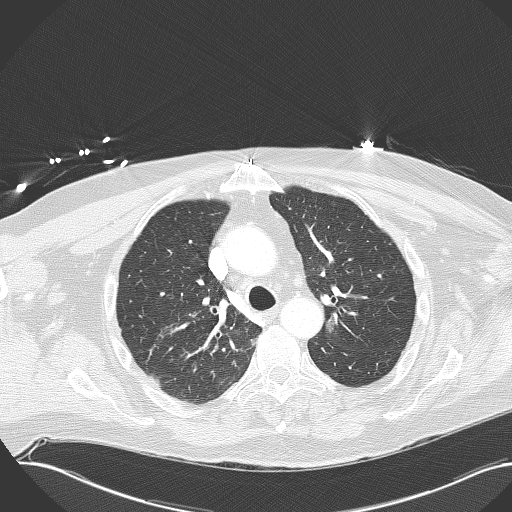
[im 97/126  lung]
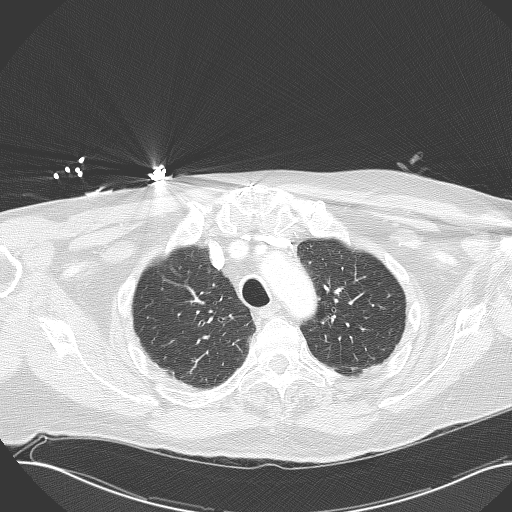
[im 106/126  mediastinal]
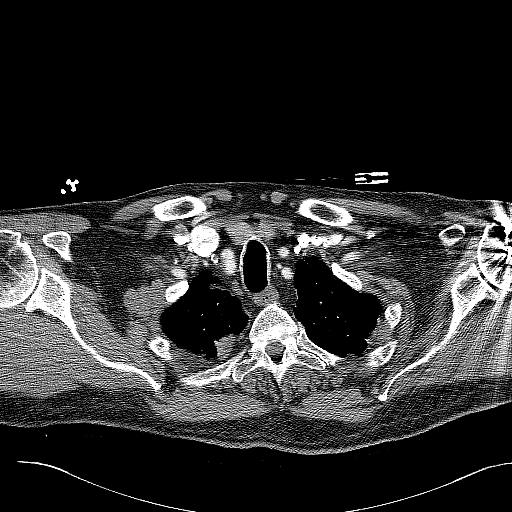
[im 106/126  lung]
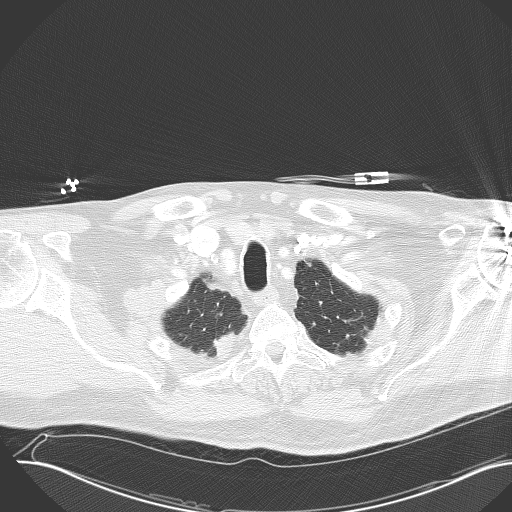
[im 116/126  lung]
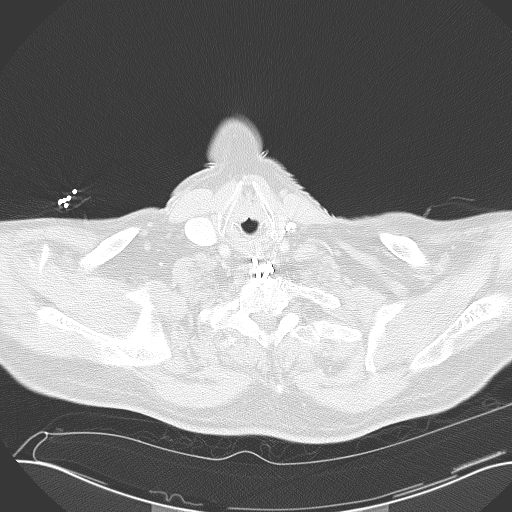

[14 of 32 positions shown; findings below may reference images not displayed]

PROCEDURE:     CT  - CT CHEST (FOR PE) W  - [DATE]  [DATE]

RESULT:     Chest CT is performed with 100 mL of [2I] iodinated
intravenous contrast with images reconstructed at 3.0 mm slice thickness in
the axial plane. The patient has no previous CT of the chest for comparison.

There is excellent opacification of the pulmonary arteries without evidence
of a pulmonary embolic filling defect. The thoracic aorta is normal in
caliber without evidence of dissection. Patchy areas of increased density
are seen at the lung bases especially in the left lower lobe and lingula.
There is some peribronchial thickening suggestive of bronchitis. Some
atelectasis is present in the right lower lobe. There is some mild pleural
thickening without significant pleural effusion. There is no mediastinal or
hilar mass or adenopathy. Sternotomy wires are present. There is no filling
defect in the right ventricle or right atrium. No axillary mass or
adenopathy is demonstrated. No central airway obstruction is evident. The
included upper abdominal structures show no gross abnormality.
IMPRESSION: 1. No findings of pulmonary embolism. Patchy areas of presumed atelectasis.
No thoracic aortic aneurysm or dissection. No adenopathy or focal pulmonary
mass demonstrated.

[REDACTED]

## 2012-07-10 LAB — CBC WITH DIFFERENTIAL/PLATELET
Basophil #: 0 10*3/uL (ref 0.0–0.1)
Basophil %: 0.6 %
Eosinophil #: 0 10*3/uL (ref 0.0–0.7)
Eosinophil %: 0.2 %
HCT: 38.5 % — ABNORMAL LOW (ref 40.0–52.0)
HGB: 13.2 g/dL (ref 13.0–18.0)
Lymphocyte %: 43.3 %
MCHC: 34.4 g/dL (ref 32.0–36.0)
MCV: 93 fL (ref 80–100)
Monocyte #: 0.5 x10 3/mm (ref 0.2–1.0)
Monocyte %: 11.7 %
Neutrophil %: 44.2 %
RDW: 13 % (ref 11.5–14.5)

## 2012-07-10 LAB — BASIC METABOLIC PANEL
BUN: 11 mg/dL (ref 7–18)
Co2: 22 mmol/L (ref 21–32)
Creatinine: 1.07 mg/dL (ref 0.60–1.30)
EGFR (Non-African Amer.): >60
Glucose: 114 mg/dL — ABNORMAL HIGH (ref 65–99)
Potassium: 3.5 mmol/L (ref 3.5–5.1)
Sodium: 136 mmol/L (ref 136–145)

## 2012-07-10 LAB — TROPONIN I: Troponin-I: <0.02 ng/mL

## 2012-07-11 LAB — COMPREHENSIVE METABOLIC PANEL
Albumin: 2.3 g/dL — ABNORMAL LOW (ref 3.4–5.0)
Alkaline Phosphatase: 143 U/L — ABNORMAL HIGH (ref 50–136)
Anion Gap: 8 (ref 7–16)
BUN: 10 mg/dL (ref 7–18)
Bilirubin,Total: 0.9 mg/dL (ref 0.2–1.0)
Chloride: 102 mmol/L (ref 98–107)
Co2: 26 mmol/L (ref 21–32)
EGFR (Non-African Amer.): >60
Glucose: 115 mg/dL — ABNORMAL HIGH (ref 65–99)
SGOT(AST): 101 U/L — ABNORMAL HIGH (ref 15–37)
SGPT (ALT): 182 U/L — ABNORMAL HIGH (ref 12–78)
Sodium: 136 mmol/L (ref 136–145)
Total Protein: 7 g/dL (ref 6.4–8.2)

## 2012-07-12 DIAGNOSIS — I4891 Unspecified atrial fibrillation: Secondary | ICD-10-CM

## 2012-07-19 ENCOUNTER — Encounter: Payer: Self-pay | Admitting: *Deleted

## 2012-07-21 ENCOUNTER — Encounter: Payer: Medicare Other | Admitting: Nurse Practitioner

## 2012-07-22 ENCOUNTER — Encounter: Payer: Medicare Other | Admitting: Cardiovascular Disease

## 2012-07-27 ENCOUNTER — Encounter: Payer: Self-pay | Admitting: *Deleted

## 2012-08-02 ENCOUNTER — Telehealth: Payer: Self-pay | Admitting: *Deleted

## 2012-08-02 ENCOUNTER — Encounter: Payer: Self-pay | Admitting: *Deleted

## 2012-08-02 NOTE — Telephone Encounter (Signed)
Called pt to rs appt missed on 07/22/12. Per pt he is not coming here anymore doing just fine.

## 2012-08-05 ENCOUNTER — Emergency Department: Payer: Self-pay | Admitting: Emergency Medicine

## 2012-08-05 LAB — BASIC METABOLIC PANEL
Anion Gap: 12 (ref 7–16)
BUN: 13 mg/dL (ref 7–18)
Calcium, Total: 9.5 mg/dL (ref 8.5–10.1)
Chloride: 106 mmol/L (ref 98–107)
Creatinine: 1.05 mg/dL (ref 0.60–1.30)
EGFR (African American): >60
EGFR (Non-African Amer.): >60

## 2012-08-05 LAB — CBC WITH DIFFERENTIAL/PLATELET
Basophil #: 0 10*3/uL (ref 0.0–0.1)
Basophil %: 0.3 %
Eosinophil %: 0.1 %
Lymphocyte #: 0.6 10*3/uL — ABNORMAL LOW (ref 1.0–3.6)
Lymphocyte %: 6.5 %
MCHC: 34.5 g/dL (ref 32.0–36.0)
MCV: 94 fL (ref 80–100)
Monocyte #: 0.2 x10 3/mm (ref 0.2–1.0)
RBC: 5.18 10*6/uL (ref 4.40–5.90)
RDW: 14.4 % (ref 11.5–14.5)
WBC: 9.9 10*3/uL (ref 3.8–10.6)

## 2012-08-06 IMAGING — CT CT ABD-PELV W/ CM
1 of 2 series · 15 of 32 positions shown, 19 images · non-contrast
Comparison: none

REASON FOR EXAM: (1) diarrhea and bilious emesis eval obstruction; (2)
diarrhea and bilious emesi
COMMENTS:

[Series 2: 3mm soft tissue · axial · 0.81mm/px · z∈[-1114,-622]mm · 15 of 180 slices shown, 19 images]
[im 8/180  soft-tissue]
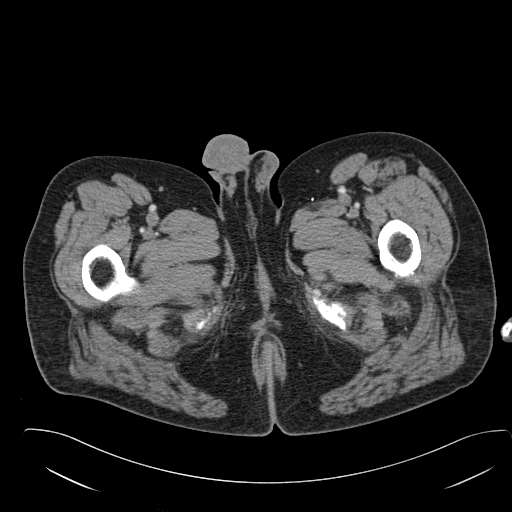
[im 8/180  bone]
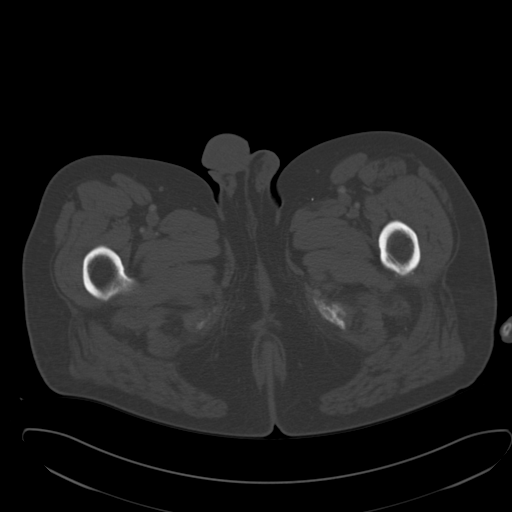
[im 24/180  soft-tissue]
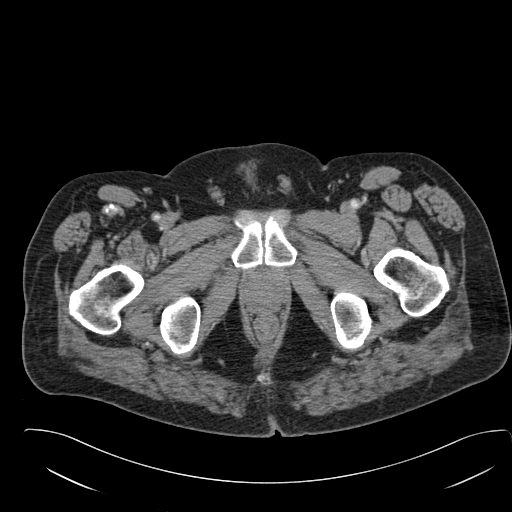
[im 39/180  soft-tissue]
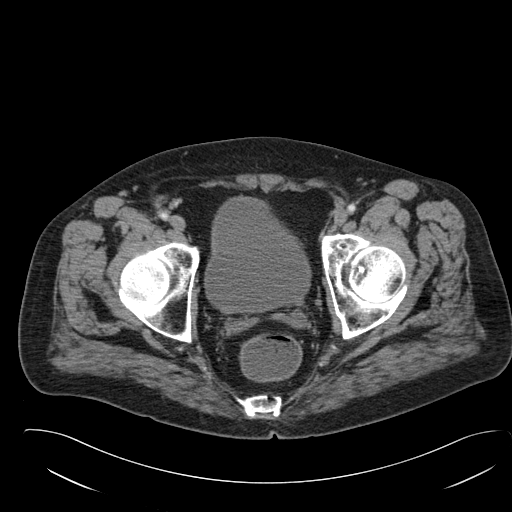
[im 47/180  soft-tissue]
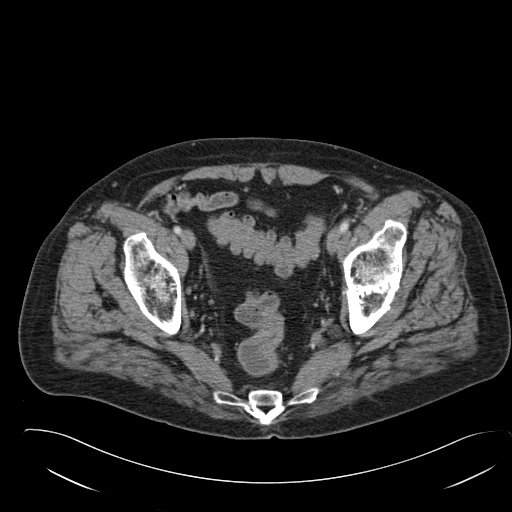
[im 63/180  soft-tissue]
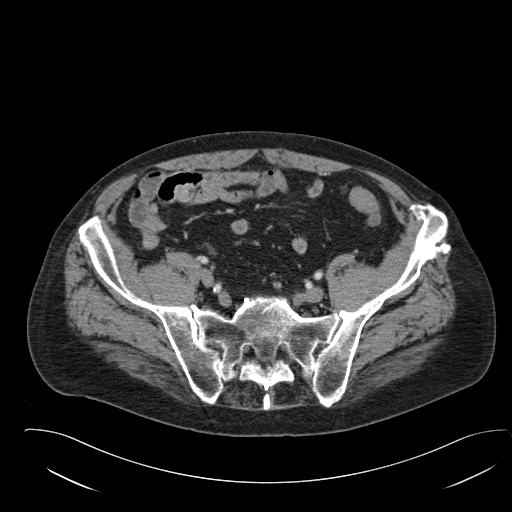
[im 78/180  soft-tissue]
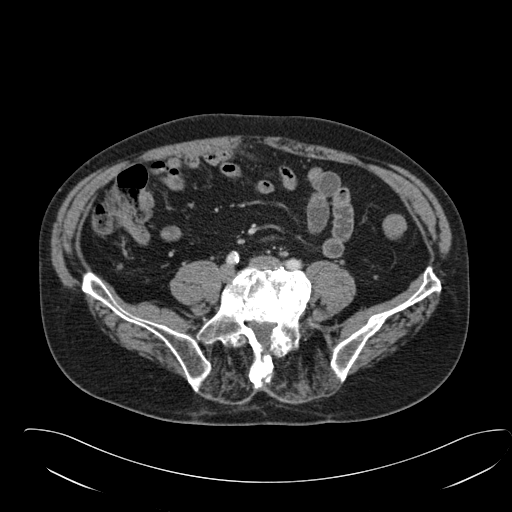
[im 94/180  soft-tissue]
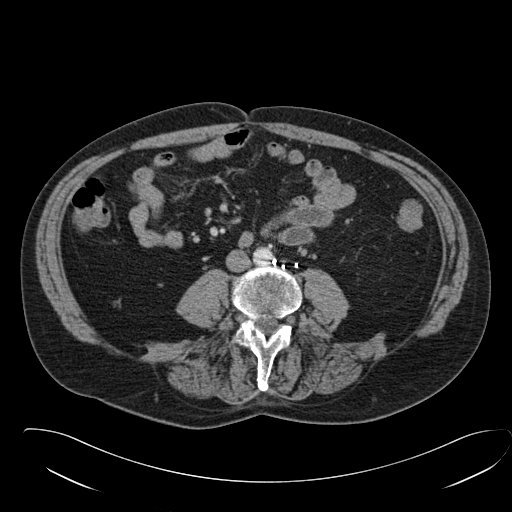
[im 102/180  soft-tissue]
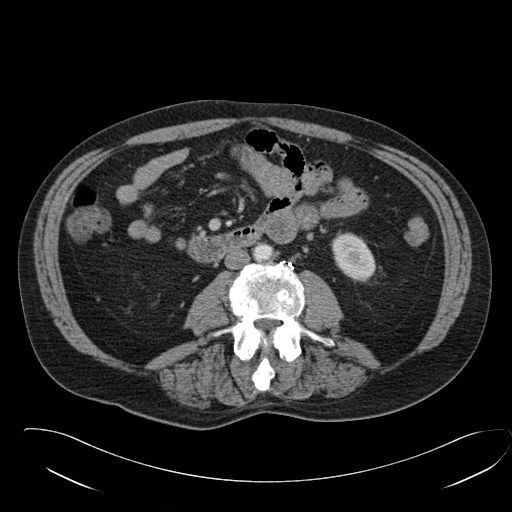
[im 117/180  soft-tissue]
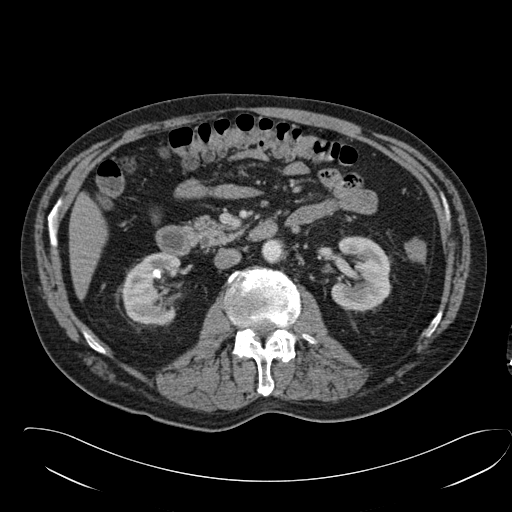
[im 117/180  bone]
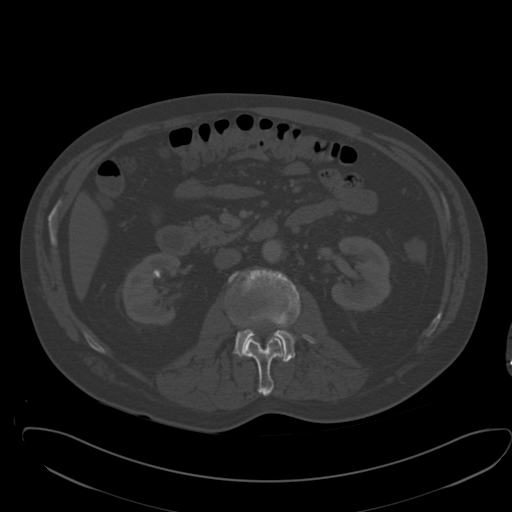
[im 133/180  soft-tissue]
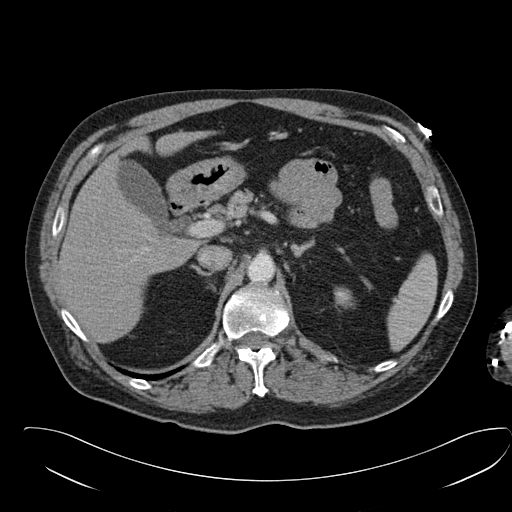
[im 141/180  soft-tissue]
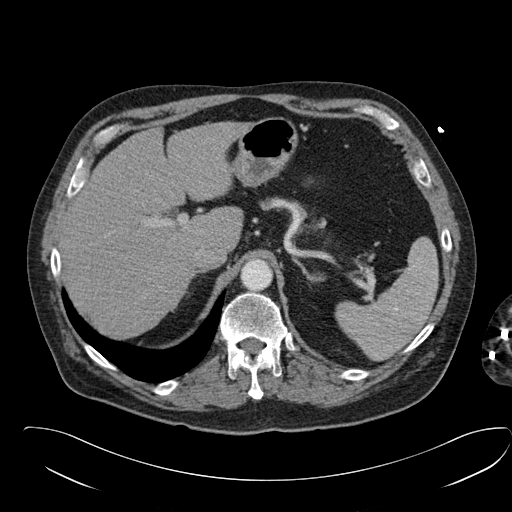
[im 148/180  lung]
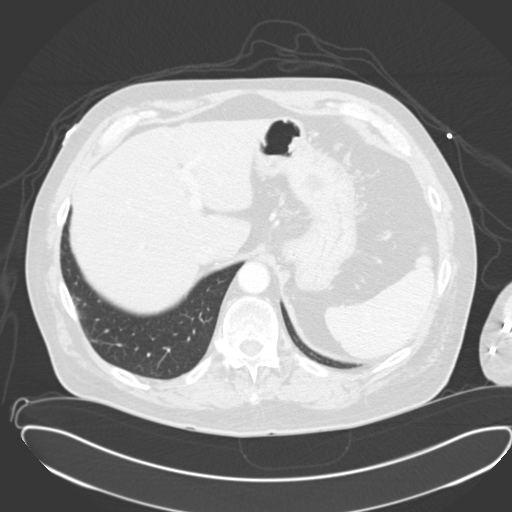
[im 156/180  soft-tissue]
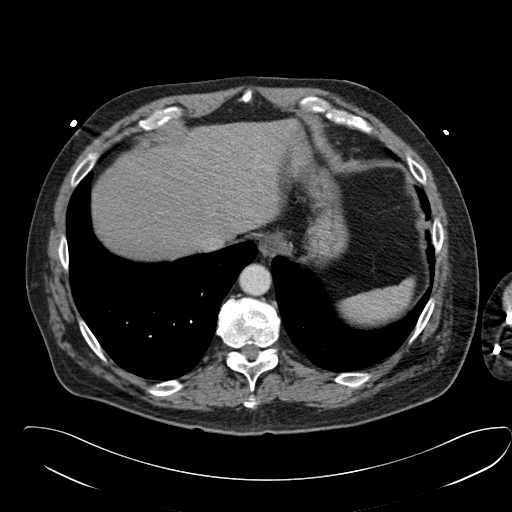
[im 156/180  lung]
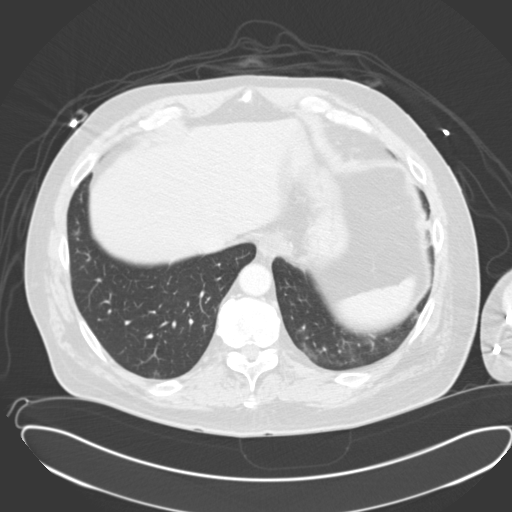
[im 164/180  lung]
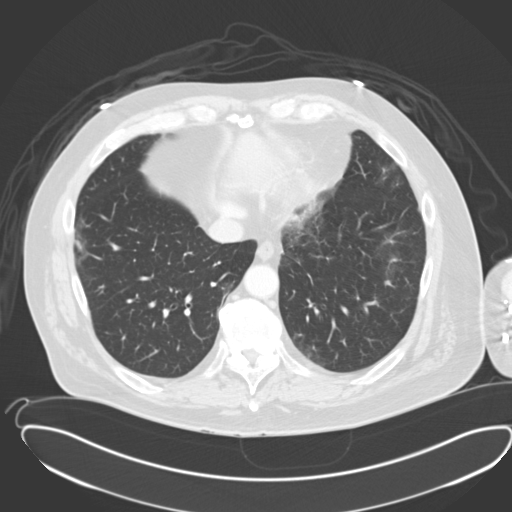
[im 172/180  soft-tissue]
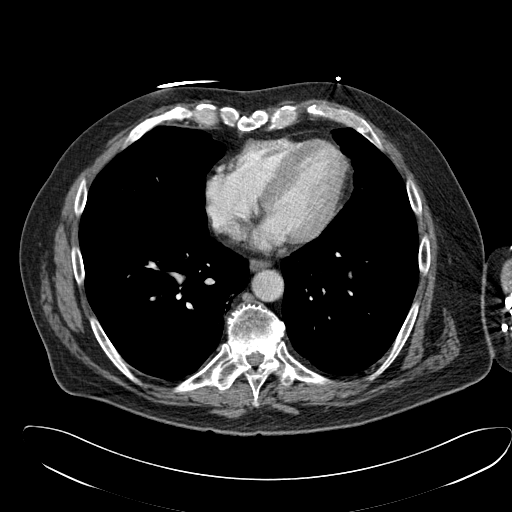
[im 172/180  lung]
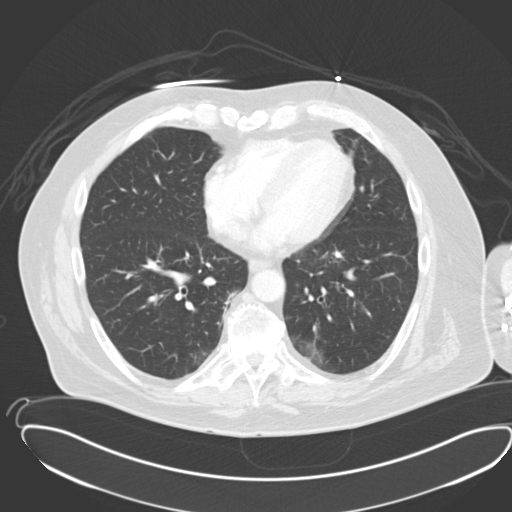

[15 of 32 positions shown; findings below may reference images not displayed]

PROCEDURE:     CT  - CT ABDOMEN / PELVIS  W  - [DATE] [DATE]

RESULT:     Axial CT scanning was performed through the abdomen and pelvis
with reconstructions at 3 mm intervals and slice thicknesses. The patient
received 100 cc of [9S] but did not receive oral contrast material.
Review of multiplanar reconstructed images was performed separately on the
VIA monitor. Comparison is made to a study [DATE].

The liver exhibits no focal mass or ductal dilation. The gallbladder,
pancreas, spleen, nondistended stomach, adrenal glands, and kidneys exhibit
no acute abnormality. There are large nonobstructing stones in both kidneys.
The perinephric fat is normal in appearance. There are surgical clips in the
periaortic region to the left. The caliber of the abdominal aorta is normal.
There is no periaortic or pericaval lymphadenopathy.

The unopacified loops of small and large bowel exhibit no evidence of ileus
nor obstruction nor acute inflammation. The partially distended urinary
bladder is normal in appearance. The prostate gland is mildly enlarged and
produces an impression upon the urinary bladder base. There is no inguinal
nor umbilical hernia. The lung bases exhibit no acute abnormalities. The
lumbar vertebral bodies are preserved in height. There is disc space
narrowing at L3-L4, L4-L5, and L5-S1.
IMPRESSION: 1. There is no evidence of acute hepatobiliary abnormality.
2. There is no evidence of bowel obstruction or ileus.
3. There are nonobstructing stones in both kidneys. A previously
demonstrated ureteral stent on the right has been removed. Neither kidney
exhibits obstruction or surrounding inflammatory change.

A preliminary report was sent to the [HOSPITAL] the conclusion
of the study.

[REDACTED]

## 2013-02-09 LAB — CBC
HCT: 43.7 % (ref 40.0–52.0)
HGB: 15.2 g/dL (ref 13.0–18.0)
MCH: 32.7 pg (ref 26.0–34.0)
MCHC: 34.7 g/dL (ref 32.0–36.0)
Platelet: 153 10*3/uL (ref 150–440)
RBC: 4.64 10*6/uL (ref 4.40–5.90)

## 2013-02-09 LAB — COMPREHENSIVE METABOLIC PANEL
Albumin: 3.8 g/dL (ref 3.4–5.0)
Anion Gap: 11 (ref 7–16)
Bilirubin,Total: 1 mg/dL (ref 0.2–1.0)
Calcium, Total: 9.5 mg/dL (ref 8.5–10.1)
Chloride: 101 mmol/L (ref 98–107)
Co2: 23 mmol/L (ref 21–32)
Creatinine: 1.39 mg/dL — ABNORMAL HIGH (ref 0.60–1.30)
EGFR (Non-African Amer.): 52 — ABNORMAL LOW
Glucose: 145 mg/dL — ABNORMAL HIGH (ref 65–99)
Osmolality: 274 (ref 275–301)
SGOT(AST): 21 U/L (ref 15–37)
SGPT (ALT): 28 U/L (ref 12–78)
Total Protein: 7.6 g/dL (ref 6.4–8.2)

## 2013-02-09 LAB — CK TOTAL AND CKMB (NOT AT ARMC)
CK, Total: 143 U/L (ref 35–232)
CK-MB: 1.6 ng/mL (ref 0.5–3.6)

## 2013-02-09 IMAGING — CR DG CHEST 1V PORT
1 series · 1 of 1 positions shown · non-contrast
Comparison: none

REASON FOR EXAM: SOB
COMMENTS:

[ap]
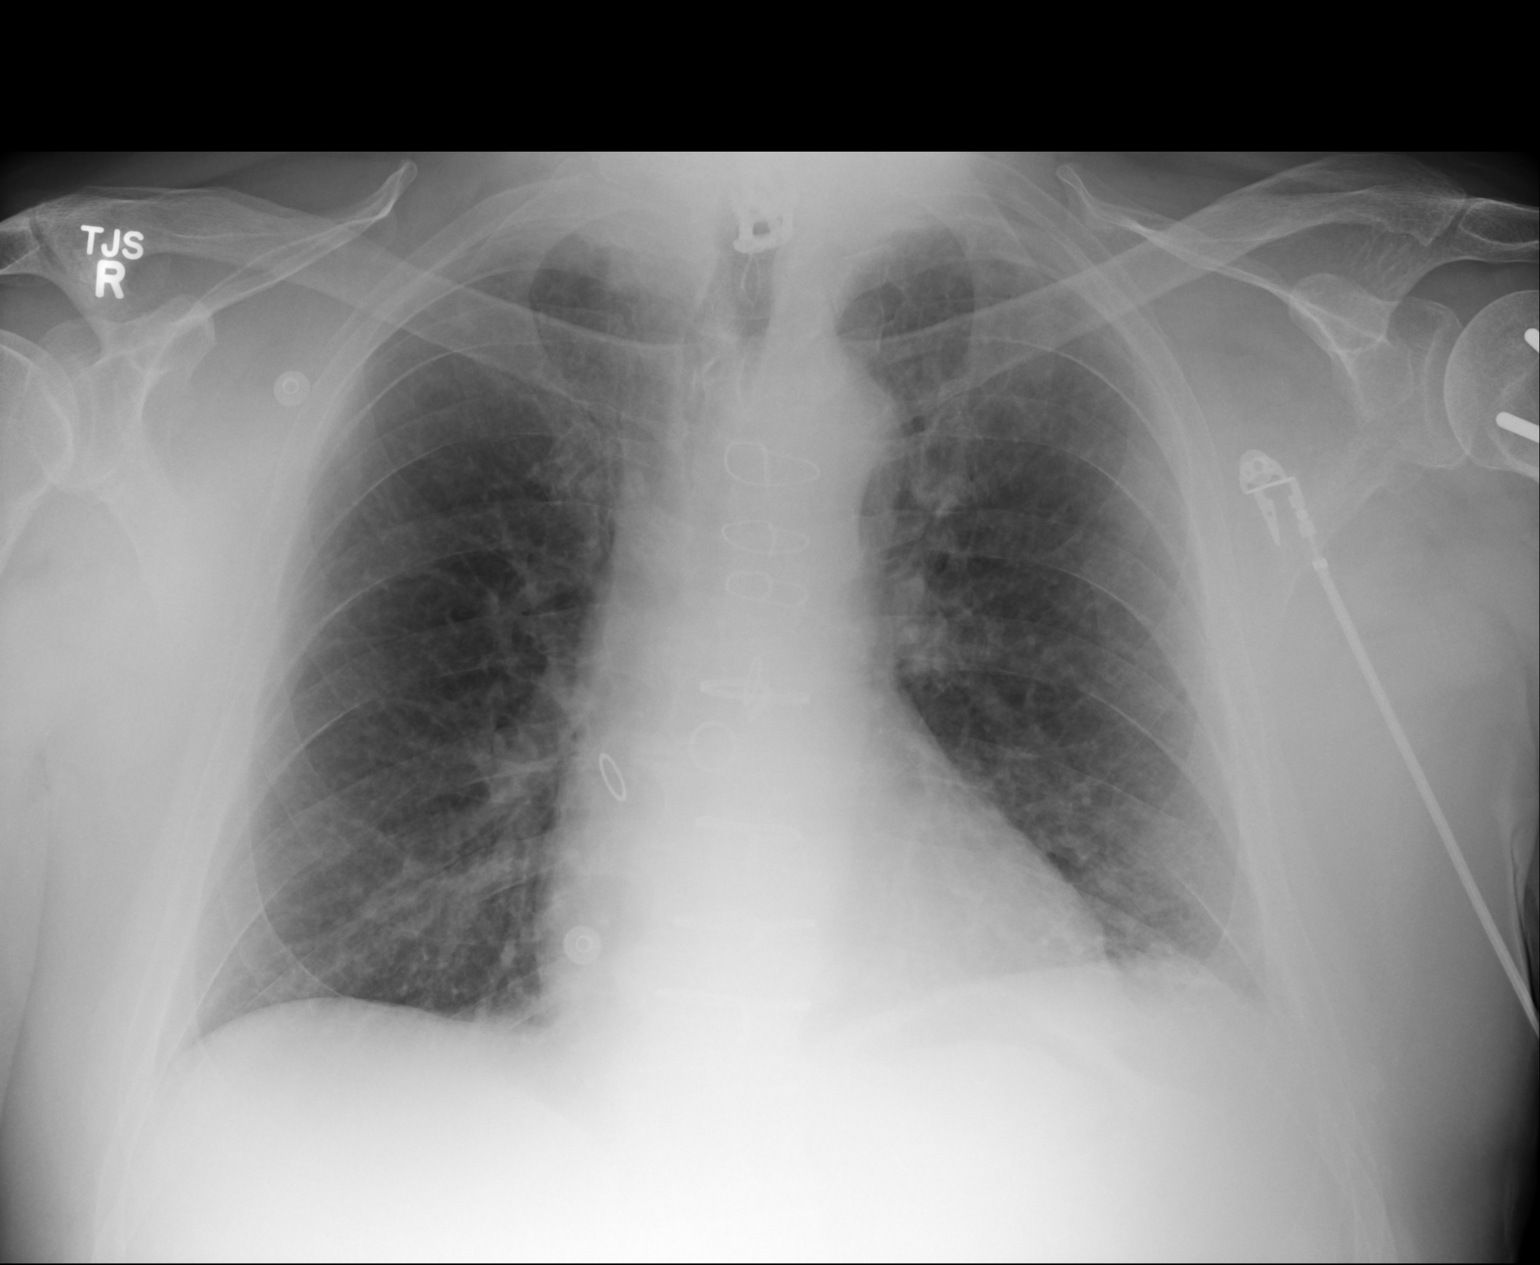

[1 of 1 positions shown; findings below may reference images not displayed]

PROCEDURE:     DXR - DXR PORTABLE CHEST SINGLE VIEW  - [DATE]  [DATE]

RESULT:     Comparison is made to the study [DATE].

The lungs are adequately inflated. There is no focal infiltrate. The cardiac
silhouette is normal in size. The mediastinum is normal in width. The
patient has undergone previous CABG.
IMPRESSION: There is no evidence of pneumonia. Minimal prominence of
the interstitial markings is present but stable. There may be underlying
reactive airway disease or COPD.

A followup PA and lateral chest x-ray would be of value if the patient has
persistent cardiopulmonary symptoms.

[REDACTED]

## 2013-02-09 IMAGING — CT CT NECK WITH CONTRAST
2 series · 10 of 14 positions shown, 12 images · IV contrast (agent unspecified)
Comparison: None

REASON FOR EXAM: stridor and throat swelling
COMMENTS:

PROCEDURE:     CT  - CT NECK WITH CONTRAST  - [DATE] [DATE]
RESULT:     Indication: Stridor, throat swelling
TECHNIQUE: Multiple sequential axial images from the apices of the lungs to
the level of the orbits obtained with 100 ml [GG] IV contrast.

[Series 3: soft tissue · axial · 0.60mm/px · z∈[-159,+72]mm · 8 of 99 slices shown, 10 images]
[im 11/99  soft-tissue]
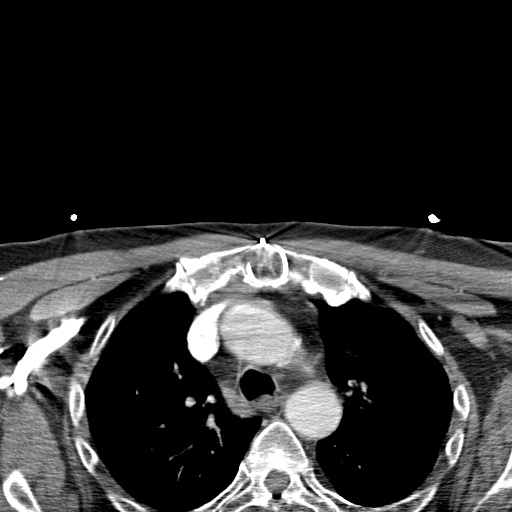
[im 11/99  bone]
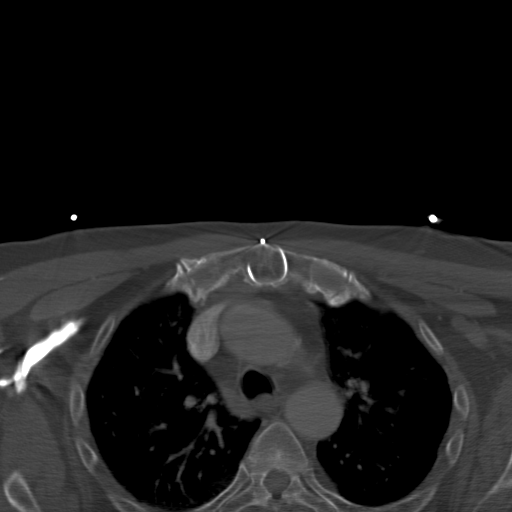
[im 22/99  bone]
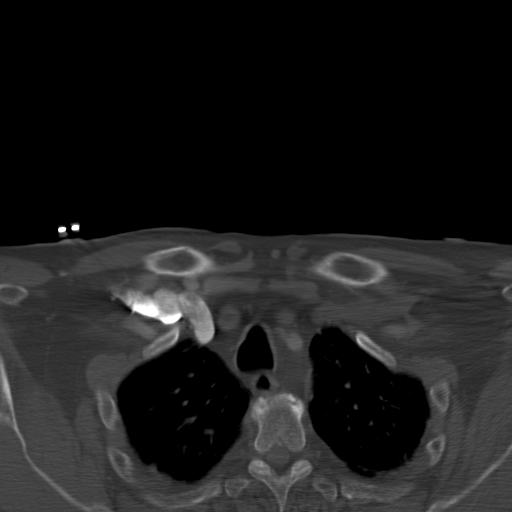
[im 33/99  bone]
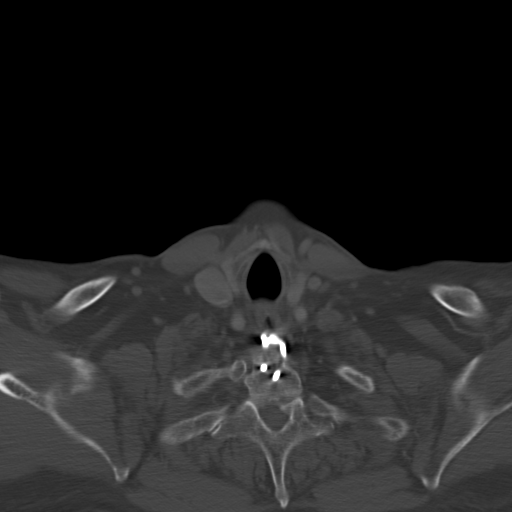
[im 44/99  bone]
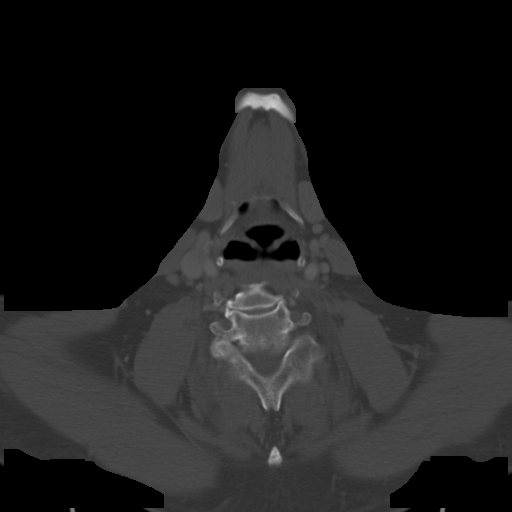
[im 55/99  soft-tissue]
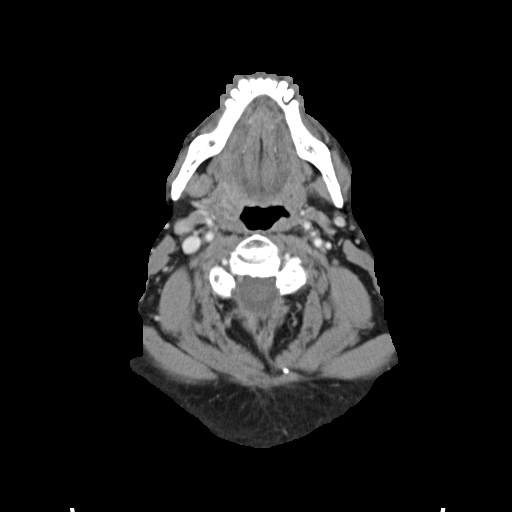
[im 55/99  bone]
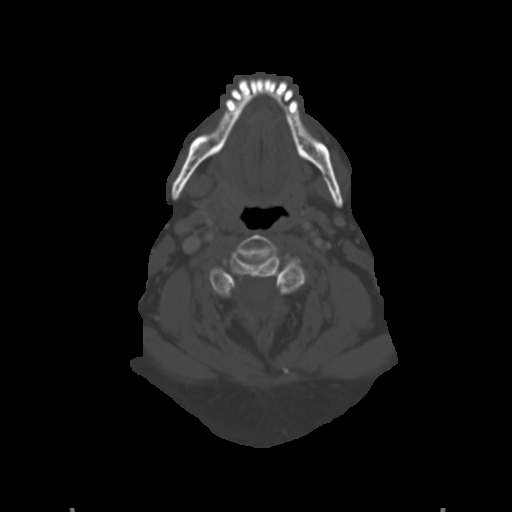
[im 66/99  bone]
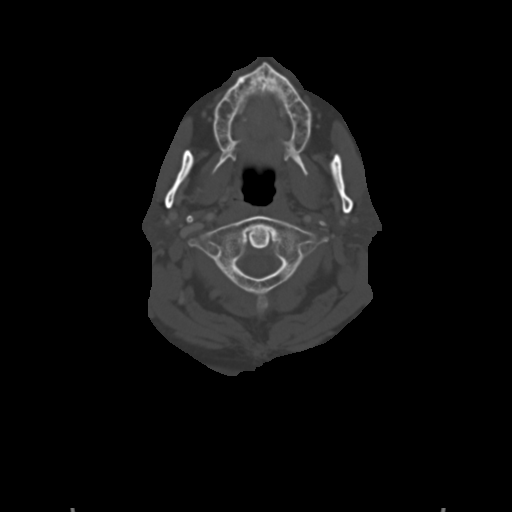
[im 77/99  bone]
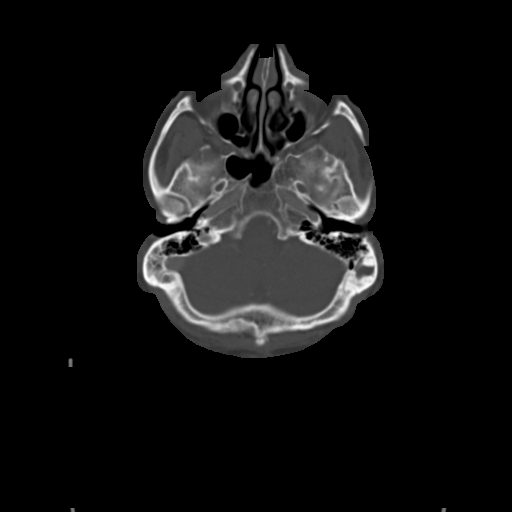
[im 88/99  bone]
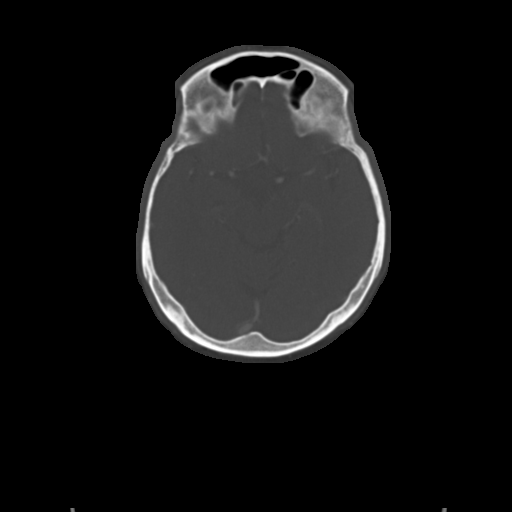

[Series 5: lung · axial · 0.62mm/px · z∈[-152,-116]mm · 2 of 38 slices shown]
[im 13/38  bone]
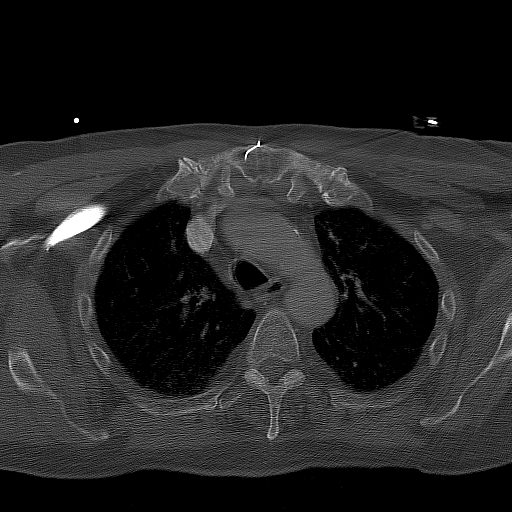
[im 25/38  bone]
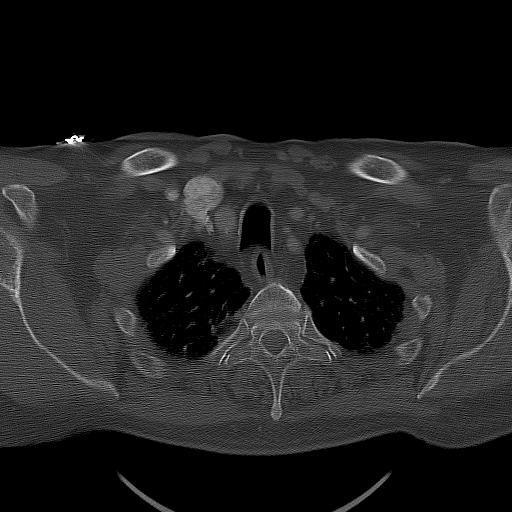

[10 of 14 positions shown; findings below may reference images not displayed]

FINDINGS: There is no lymphadenopathy. There is no nasopharyngeal or
oropharyngeal mass. There is no focal fluid collection. The airway is
patent. There is complex atherosclerotic plaque within the carotid bulbs
bilaterally resulting in greater than 75% stenosis of the proximal ICAs
bilaterally.

The visualized portions of the brain demonstrate no focal abnormality.

The paranasal sinuses are clear.

There is no lytic or blastic osseous lesion. There is cervical spondylosis
noted.
IMPRESSION: 1. No acute disease of the neck.

2.  There is complex atherosclerotic plaque within the carotid bulbs
bilaterally resulting in greater than 75% stenosis of the proximal ICAs
bilaterally. Vascular surgery consultation is recommended.

[REDACTED]

## 2013-02-10 ENCOUNTER — Observation Stay: Payer: Self-pay | Admitting: Internal Medicine

## 2013-02-10 DIAGNOSIS — R079 Chest pain, unspecified: Secondary | ICD-10-CM

## 2013-02-10 LAB — HEMOGLOBIN A1C: Hemoglobin A1C: 6.8 % — ABNORMAL HIGH (ref 4.2–6.3)

## 2013-02-10 LAB — CBC WITH DIFFERENTIAL/PLATELET
Basophil #: 0 10*3/uL (ref 0.0–0.1)
Basophil %: 0.1 %
Eosinophil %: 0 %
HGB: 14.9 g/dL (ref 13.0–18.0)
Lymphocyte %: 4.6 %
MCHC: 35 g/dL (ref 32.0–36.0)
MCV: 94 fL (ref 80–100)
Monocyte #: 0.1 x10 3/mm — ABNORMAL LOW (ref 0.2–1.0)
Monocyte %: 0.7 %
Neutrophil %: 94.6 %
Platelet: 152 10*3/uL (ref 150–440)
RBC: 4.52 10*6/uL (ref 4.40–5.90)
RDW: 13.6 % (ref 11.5–14.5)
WBC: 15.9 10*3/uL — ABNORMAL HIGH (ref 3.8–10.6)

## 2013-02-10 LAB — BASIC METABOLIC PANEL
BUN: 15 mg/dL (ref 7–18)
Chloride: 100 mmol/L (ref 98–107)
Co2: 23 mmol/L (ref 21–32)
Osmolality: 275 (ref 275–301)

## 2013-02-10 LAB — TROPONIN I: Troponin-I: <0.02 ng/mL

## 2013-02-10 LAB — CK TOTAL AND CKMB (NOT AT ARMC)
CK, Total: 89 U/L (ref 35–232)
CK-MB: 0.8 ng/mL (ref 0.5–3.6)
CK-MB: 0.9 ng/mL (ref 0.5–3.6)

## 2013-02-11 ENCOUNTER — Telehealth: Payer: Self-pay

## 2013-02-11 LAB — BASIC METABOLIC PANEL
Anion Gap: 8 (ref 7–16)
BUN: 18 mg/dL (ref 7–18)
Calcium, Total: 9 mg/dL (ref 8.5–10.1)
Chloride: 101 mmol/L (ref 98–107)
Co2: 27 mmol/L (ref 21–32)
Osmolality: 276 (ref 275–301)

## 2013-02-11 NOTE — Telephone Encounter (Signed)
Patient contacted regarding discharge from [facility] on 02/11/13.  Patient understands to follow up with provider Hurman Horn, PA on 02/16/13 at 1:00 at [facility] office. Patient understands discharge instructions? yes Patient understands medications and regimen? yes Patient understands to bring all medications to this visit? yes

## 2013-02-11 NOTE — Telephone Encounter (Signed)
Message copied by Marilynne Halsted on Fri Feb 11, 2013  2:32 PM ------      Message from: Sandre Kitty F      Created: Fri Feb 11, 2013  2:18 PM      Regarding: tcm/ph       02/16/2013 with Alinda Money at 1:00 ------

## 2013-02-15 ENCOUNTER — Ambulatory Visit: Payer: Self-pay | Admitting: Internal Medicine

## 2013-02-15 LAB — COMPREHENSIVE METABOLIC PANEL
Albumin: 3.3 g/dL — ABNORMAL LOW (ref 3.4–5.0)
Anion Gap: 7 (ref 7–16)
BUN: 16 mg/dL (ref 7–18)
Bilirubin,Total: 1.2 mg/dL — ABNORMAL HIGH (ref 0.2–1.0)
Co2: 26 mmol/L (ref 21–32)
Creatinine: 1.39 mg/dL — ABNORMAL HIGH (ref 0.60–1.30)
EGFR (African American): >60
Glucose: 140 mg/dL — ABNORMAL HIGH (ref 65–99)
Osmolality: 272 (ref 275–301)
Potassium: 3.8 mmol/L (ref 3.5–5.1)
SGPT (ALT): 32 U/L (ref 12–78)

## 2013-02-15 LAB — CBC
HCT: 41.4 % (ref 40.0–52.0)
HGB: 14.7 g/dL (ref 13.0–18.0)
MCH: 32.9 pg (ref 26.0–34.0)
RBC: 4.48 10*6/uL (ref 4.40–5.90)
RDW: 13.2 % (ref 11.5–14.5)

## 2013-02-15 LAB — TROPONIN I: Troponin-I: <0.02 ng/mL

## 2013-02-15 IMAGING — CR DG CHEST 1V PORT
1 series · 1 of 1 positions shown · non-contrast
Comparison: none

REASON FOR EXAM: Chest pain
COMMENTS:

PROCEDURE:     DXR - DXR PORTABLE CHEST SINGLE VIEW  - [DATE]  [DATE]
RESULT:     Comparison made to prior study [DATE]. Prior CABG. Prior
cervical spine fusion. Lungs are clear of infiltrates. Heart size is
slightly  prominent, no CHF.

[ap]
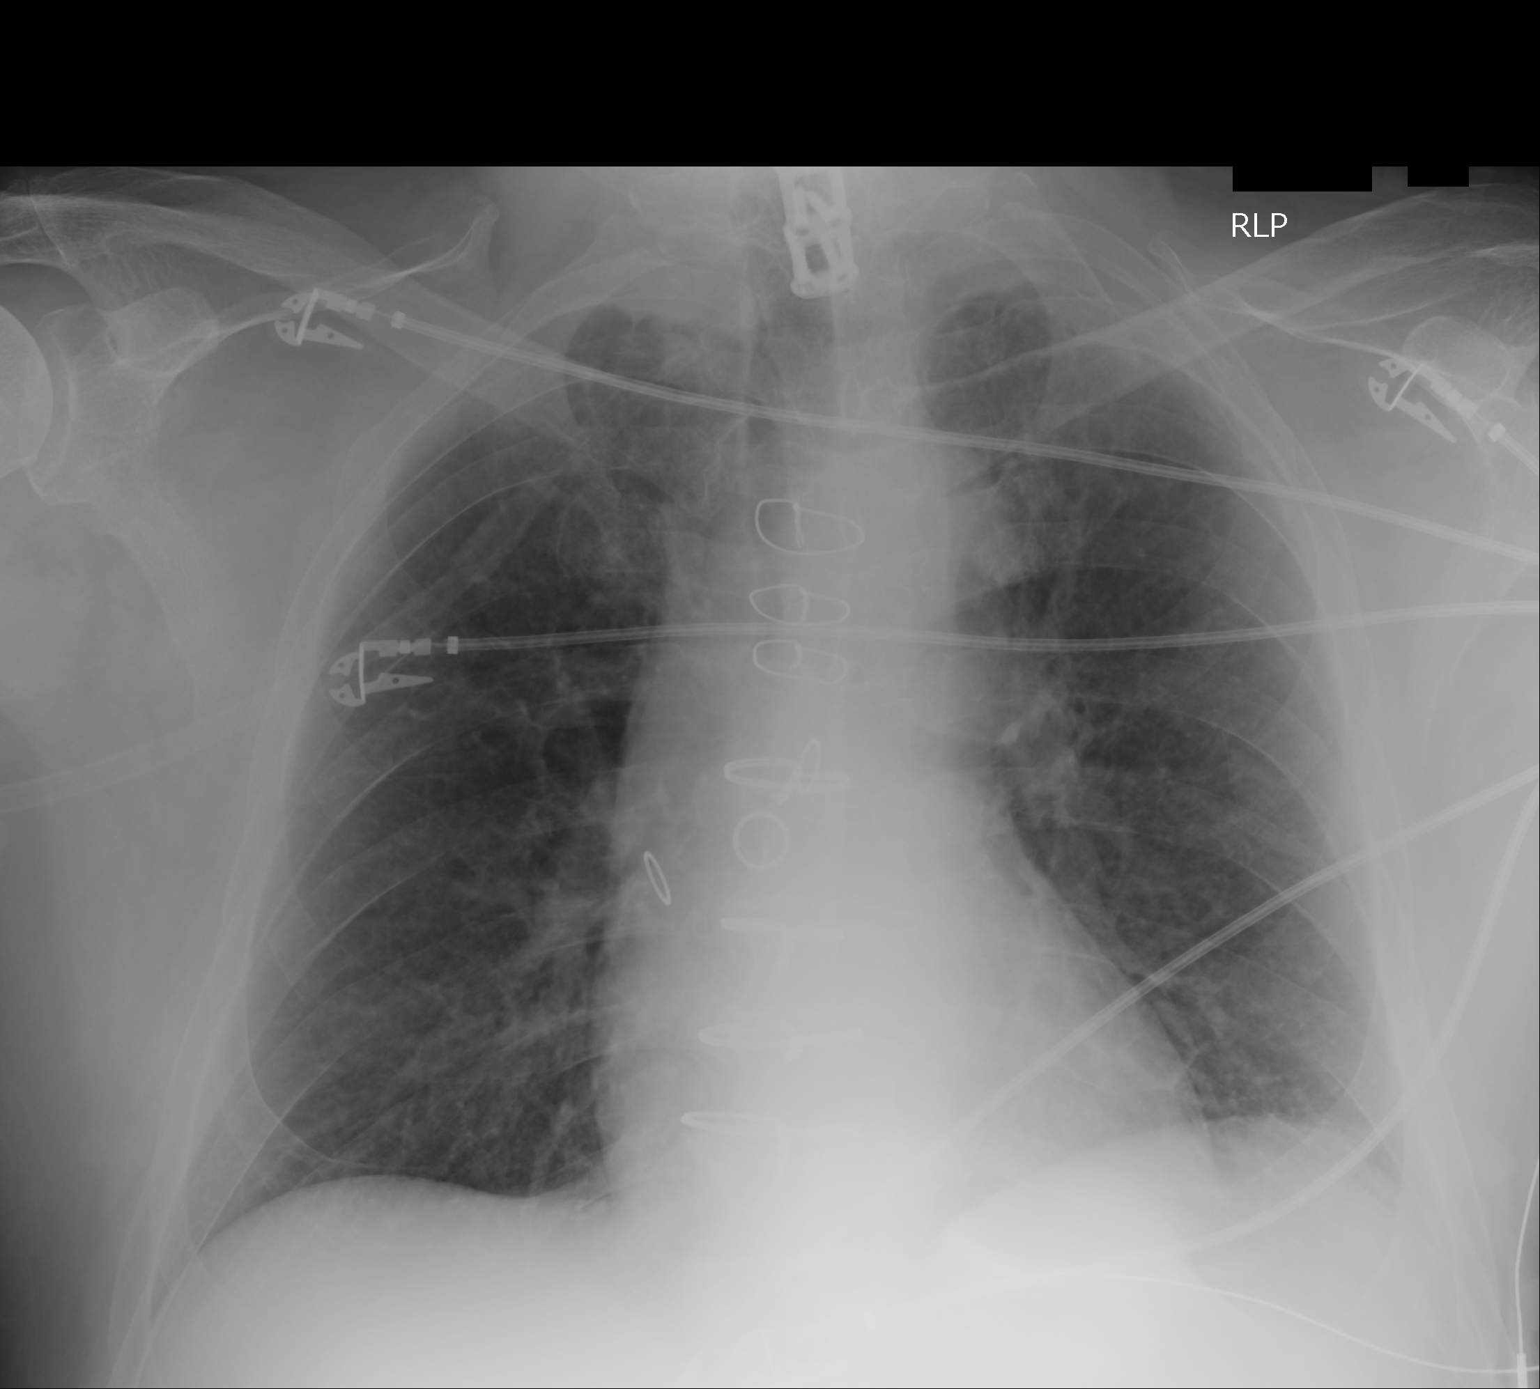

[1 of 1 positions shown; findings below may reference images not displayed]

IMPRESSION: No acute abnormality. Prior CABG. Myocardium megaly. No CHF.

## 2013-02-16 ENCOUNTER — Encounter: Payer: Self-pay | Admitting: Physician Assistant

## 2013-02-16 ENCOUNTER — Encounter: Payer: Medicare Other | Admitting: Physician Assistant

## 2013-02-16 ENCOUNTER — Encounter: Payer: Self-pay | Admitting: *Deleted

## 2013-02-16 LAB — BASIC METABOLIC PANEL
Anion Gap: 8 (ref 7–16)
BUN: 13 mg/dL (ref 7–18)
Co2: 26 mmol/L (ref 21–32)
Creatinine: 1.17 mg/dL (ref 0.60–1.30)
EGFR (African American): >60
EGFR (Non-African Amer.): >60
Sodium: 134 mmol/L — ABNORMAL LOW (ref 136–145)

## 2013-02-16 LAB — CBC WITH DIFFERENTIAL/PLATELET
Basophil #: 0 10*3/uL (ref 0.0–0.1)
Basophil %: 0.3 %
Eosinophil #: 0.2 10*3/uL (ref 0.0–0.7)
HCT: 40.6 % (ref 40.0–52.0)
HGB: 14.3 g/dL (ref 13.0–18.0)
Lymphocyte #: 2.6 10*3/uL (ref 1.0–3.6)
Lymphocyte %: 24.2 %
MCHC: 35.4 g/dL (ref 32.0–36.0)
MCV: 93 fL (ref 80–100)
Monocyte %: 6.6 %
Neutrophil #: 7.3 10*3/uL — ABNORMAL HIGH (ref 1.4–6.5)
Neutrophil %: 67.5 %
RBC: 4.36 10*6/uL — ABNORMAL LOW (ref 4.40–5.90)
RDW: 13.6 % (ref 11.5–14.5)
WBC: 10.9 10*3/uL — ABNORMAL HIGH (ref 3.8–10.6)

## 2013-02-24 ENCOUNTER — Ambulatory Visit: Payer: Self-pay | Admitting: Gastroenterology

## 2013-02-24 IMAGING — US ABDOMEN ULTRASOUND
1 series · 13 of 25 positions shown · non-contrast
Comparison: none

REASON FOR EXAM: Epigastric Abd Pain
COMMENTS:

[Series 1: abdomen ultrasound · 0.31mm/px · 13 of 89 slices shown]
[im 1/89]
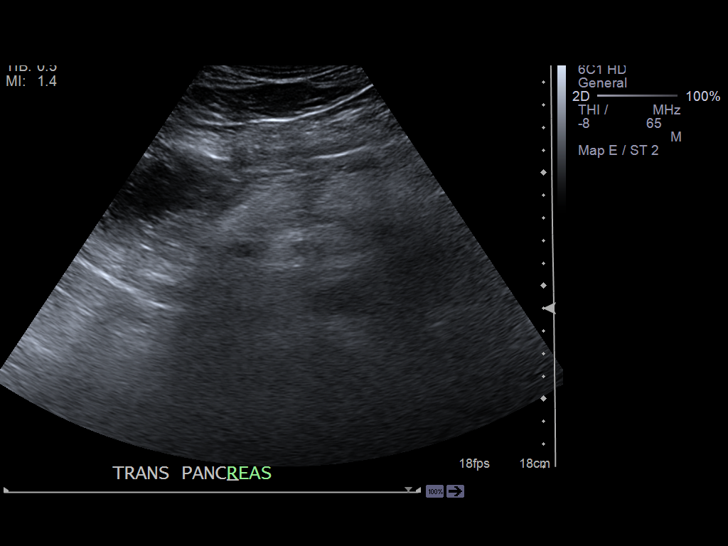
[im 8/89]
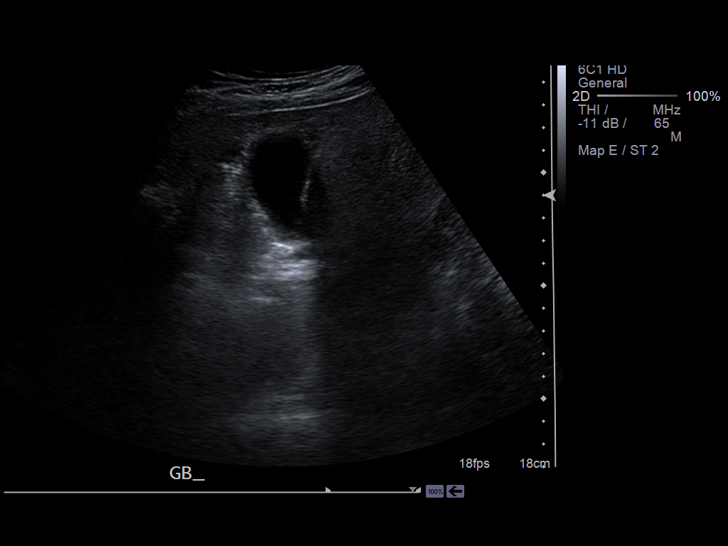
[im 15/89]
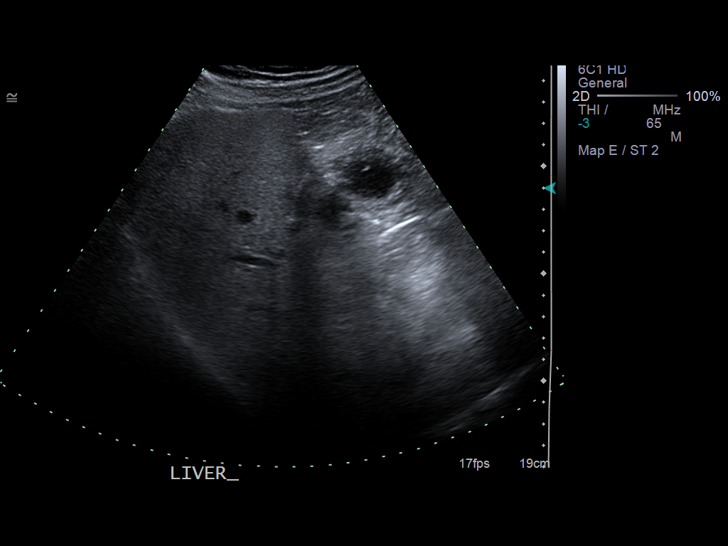
[im 23/89]
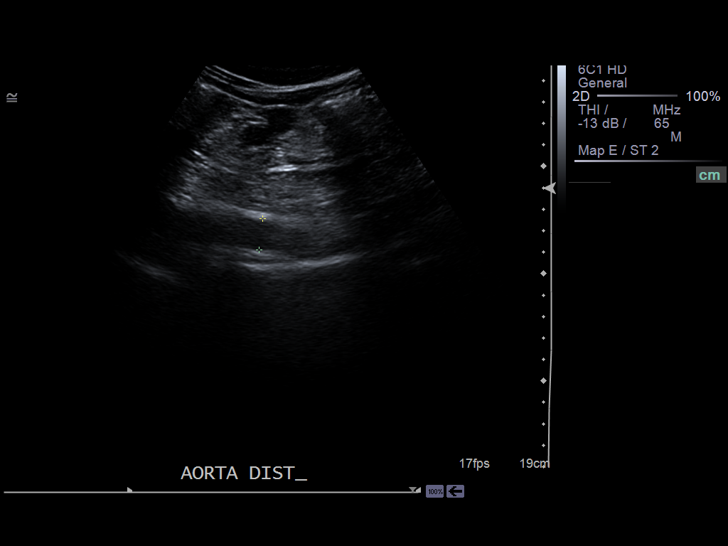
[im 30/89]
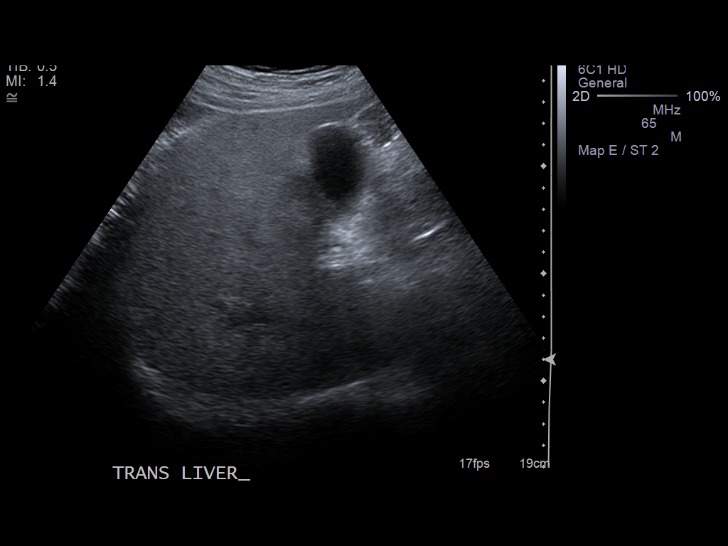
[im 37/89]
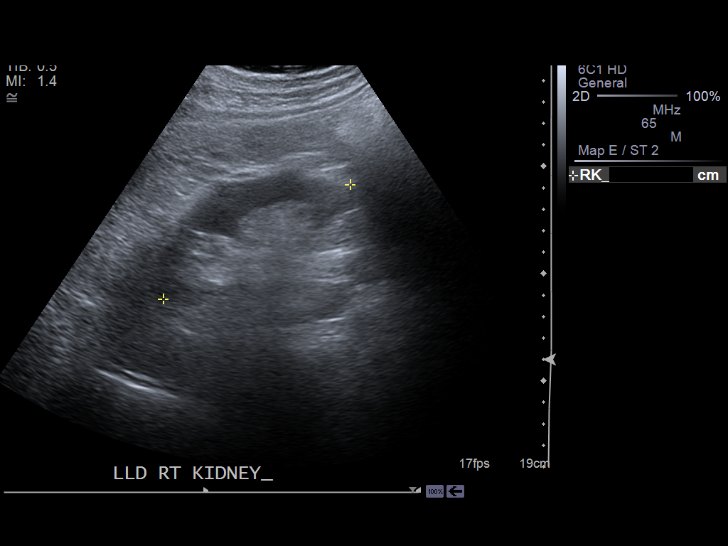
[im 45/89]
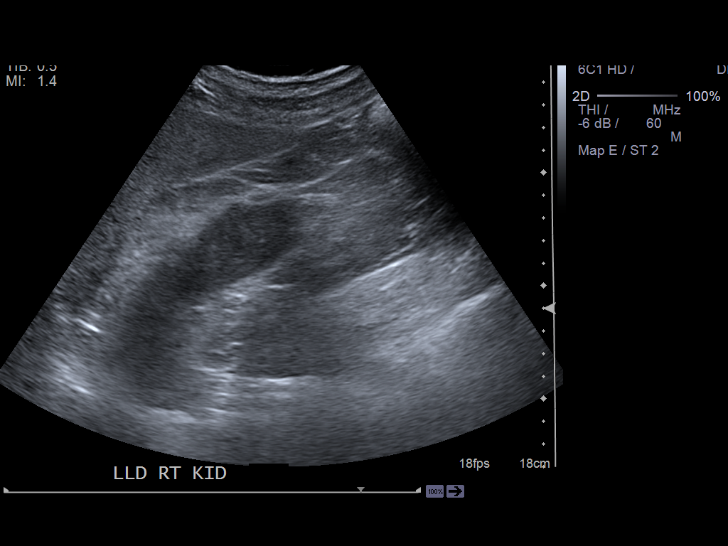
[im 52/89]
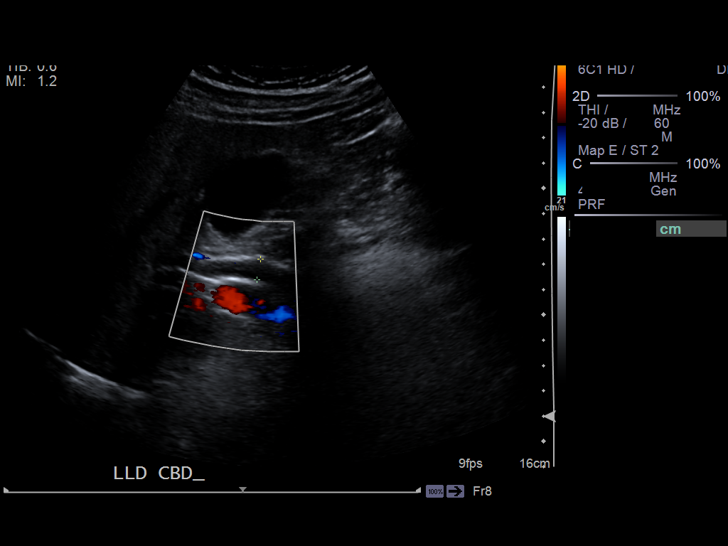
[im 59/89]
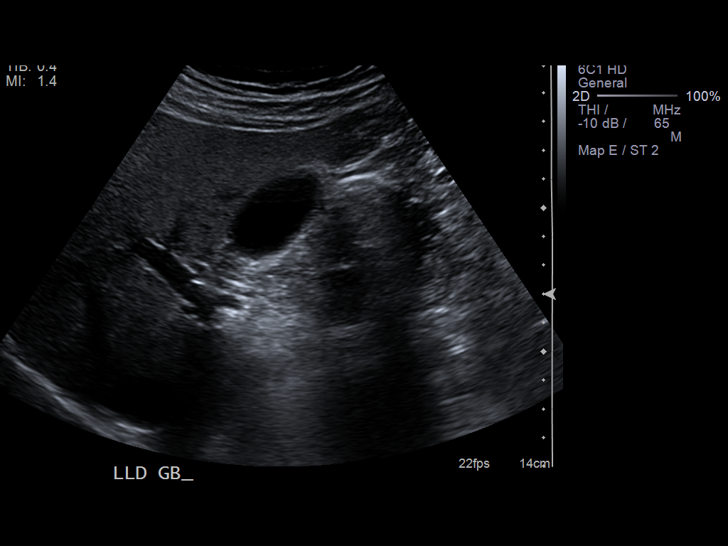
[im 67/89]
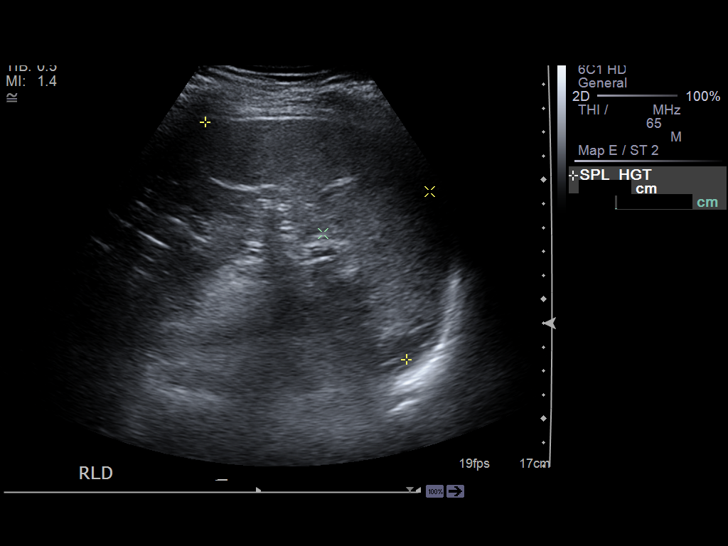
[im 74/89]
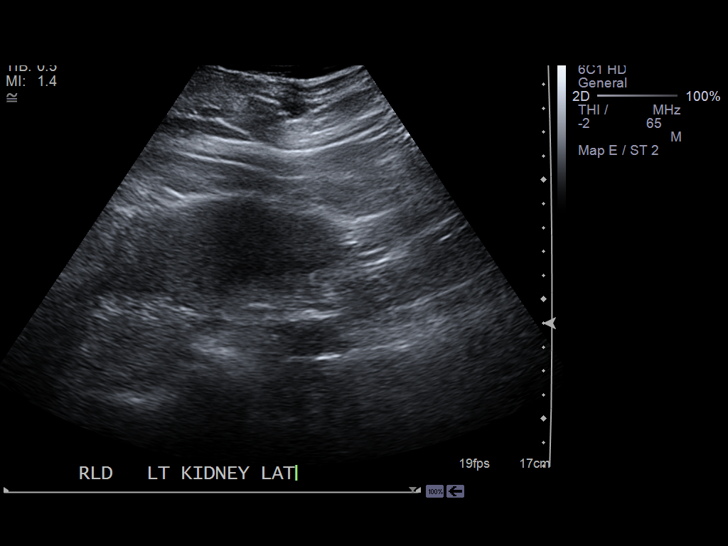
[im 81/89]
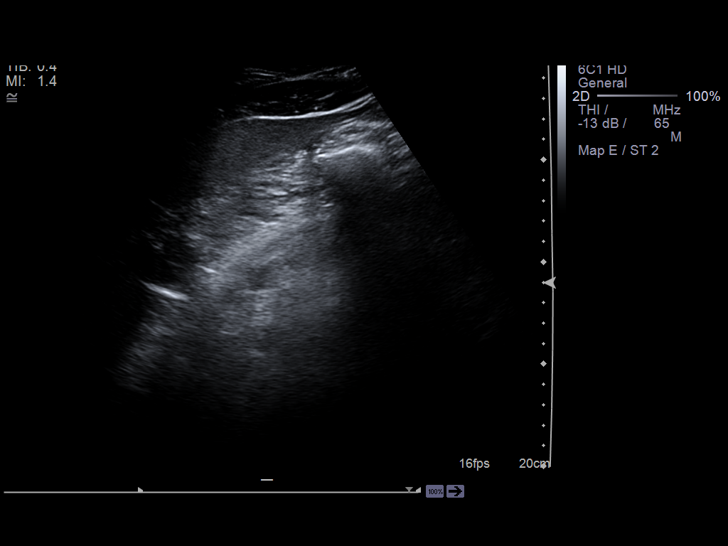
[im 89/89]
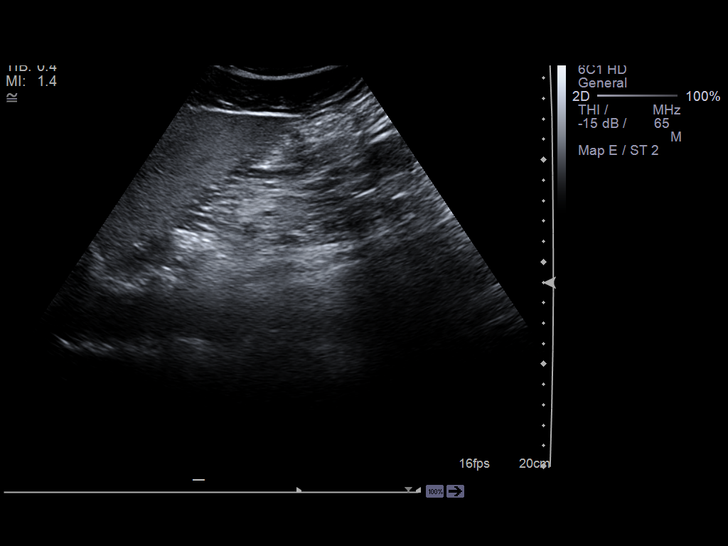

[13 of 25 positions shown; findings below may reference images not displayed]

PROCEDURE:     US  - US ABDOMEN GENERAL SURVEY  - [DATE]  [DATE]

RESULT:     The liver exhibits normal echotexture with no focal mass nor
ductal dilation. Portal venous flow is normal in direction toward the liver.
The pancreas exhibits no evidence of ductal dilation or focal masses or
surrounding inflammatory changes or fluid. The gallbladder is adequately
distended with no evidence of stones, wall thickening, or pericholecystic
fluid. There is no positive sonographic Murphy's sign. The common bile duct
is dilated at 9.3 mm.

The spleen is top normal in size at 13 cm appropriate for the patient's body
habitus. The abdominal aorta exhibits maximal diameter of 2.6 cm proximally.
The inferior vena cava and kidneys are normal in appearance.
IMPRESSION: 1. The common bile duct is dilated but this has been previously described as
far back as [PU]. The etiology for this is not clear. There is no
intrahepatic ductal dilation. No pancreatic mass is evident.
2. The liver, gallbladder, and pancreas appear normal.
3. The spleen is top normal in size.
4. The kidneys exhibit no acute abnormalities.

Given that the patient is symptomatic, it may be useful to consider him for
followup abdominal and pelvic CT scanning. Review of the previous CT scan [DATE] revealed the common bile duct to be smaller in caliber.

[REDACTED]

## 2013-02-27 ENCOUNTER — Emergency Department: Payer: Self-pay | Admitting: Emergency Medicine

## 2013-02-27 LAB — URINALYSIS, COMPLETE
Bacteria: NONE SEEN
Bilirubin,UR: NEGATIVE
Glucose,UR: NEGATIVE mg/dL (ref 0–75)
Ketone: NEGATIVE
Nitrite: NEGATIVE
Ph: 6 (ref 4.5–8.0)
Protein: 150
RBC,UR: 2164 /HPF (ref 0–5)
Squamous Epithelial: NONE SEEN

## 2013-02-27 LAB — BASIC METABOLIC PANEL
Anion Gap: 8 (ref 7–16)
BUN: 20 mg/dL — ABNORMAL HIGH (ref 7–18)
Calcium, Total: 9.2 mg/dL (ref 8.5–10.1)
Chloride: 98 mmol/L (ref 98–107)
Creatinine: 1.32 mg/dL — ABNORMAL HIGH (ref 0.60–1.30)
EGFR (African American): >60
EGFR (Non-African Amer.): 55 — ABNORMAL LOW
Osmolality: 270 (ref 275–301)
Sodium: 133 mmol/L — ABNORMAL LOW (ref 136–145)

## 2013-02-27 LAB — CBC
HGB: 13.3 g/dL (ref 13.0–18.0)
MCHC: 35.4 g/dL (ref 32.0–36.0)
RBC: 4.08 10*6/uL — ABNORMAL LOW (ref 4.40–5.90)
WBC: 6.9 10*3/uL (ref 3.8–10.6)

## 2013-02-28 LAB — URINE CULTURE

## 2013-03-14 ENCOUNTER — Ambulatory Visit: Payer: Self-pay | Admitting: Gastroenterology

## 2013-03-16 LAB — PATHOLOGY REPORT

## 2013-03-28 ENCOUNTER — Inpatient Hospital Stay: Payer: Self-pay | Admitting: Vascular Surgery

## 2013-03-28 LAB — BASIC METABOLIC PANEL
Calcium, Total: 9.5 mg/dL (ref 8.5–10.1)
Chloride: 100 mmol/L (ref 98–107)
Co2: 26 mmol/L (ref 21–32)
Creatinine: 1.49 mg/dL — ABNORMAL HIGH (ref 0.60–1.30)
EGFR (African American): 55 — ABNORMAL LOW
EGFR (Non-African Amer.): 48 — ABNORMAL LOW
Glucose: 156 mg/dL — ABNORMAL HIGH (ref 65–99)
Potassium: 3.5 mmol/L (ref 3.5–5.1)

## 2013-03-29 LAB — CBC WITH DIFFERENTIAL/PLATELET
Basophil %: 0.5 %
Eosinophil #: 0.2 10*3/uL (ref 0.0–0.7)
HGB: 12.7 g/dL — ABNORMAL LOW (ref 13.0–18.0)
Lymphocyte %: 20.6 %
MCHC: 35 g/dL (ref 32.0–36.0)
MCV: 93 fL (ref 80–100)
Monocyte #: 0.7 x10 3/mm (ref 0.2–1.0)
Monocyte %: 7.9 %
RBC: 3.9 10*6/uL — ABNORMAL LOW (ref 4.40–5.90)
RDW: 13.2 % (ref 11.5–14.5)

## 2013-03-29 LAB — APTT: Activated PTT: 31.5 secs (ref 23.6–35.9)

## 2013-03-29 LAB — BASIC METABOLIC PANEL
BUN: 13 mg/dL (ref 7–18)
Chloride: 104 mmol/L (ref 98–107)
Co2: 24 mmol/L (ref 21–32)
Creatinine: 1.52 mg/dL — ABNORMAL HIGH (ref 0.60–1.30)
EGFR (Non-African Amer.): 47 — ABNORMAL LOW
Potassium: 3.6 mmol/L (ref 3.5–5.1)

## 2013-03-29 LAB — PROTIME-INR: INR: 1.1

## 2013-05-02 ENCOUNTER — Inpatient Hospital Stay: Payer: Self-pay | Admitting: Physician Assistant

## 2013-05-02 LAB — BASIC METABOLIC PANEL
Calcium, Total: 9.9 mg/dL (ref 8.5–10.1)
Co2: 28 mmol/L (ref 21–32)
Creatinine: 1.41 mg/dL — ABNORMAL HIGH (ref 0.60–1.30)
EGFR (African American): 59 — ABNORMAL LOW
EGFR (Non-African Amer.): 51 — ABNORMAL LOW
Osmolality: 271 (ref 275–301)
Sodium: 130 mmol/L — ABNORMAL LOW (ref 136–145)

## 2013-05-02 IMAGING — XA IR VASCULAR PROCEDURE
15 of 20 series · 15 of 24 positions shown · IV contrast (IODINE)
Comparison: none

[Series 1: arch · 3 acquisitions, 1 frame shown (1 of 4)]
[im 1/3]
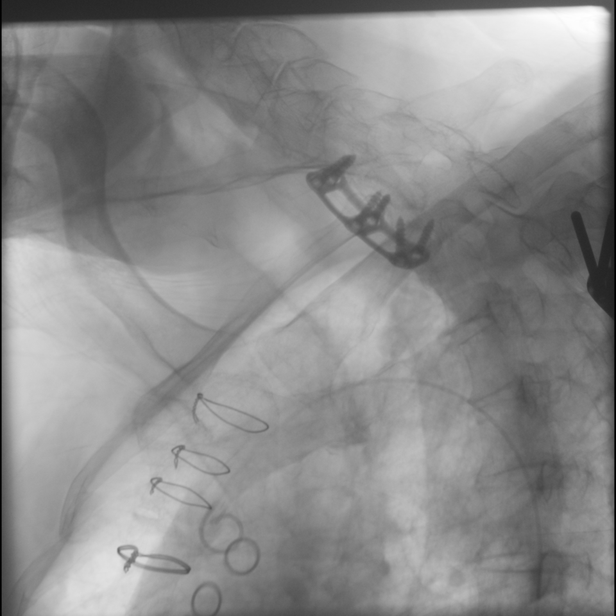

[Series 2: arch · 3 acquisitions, 1 frame shown (2 of 4)]
[im 1/3]
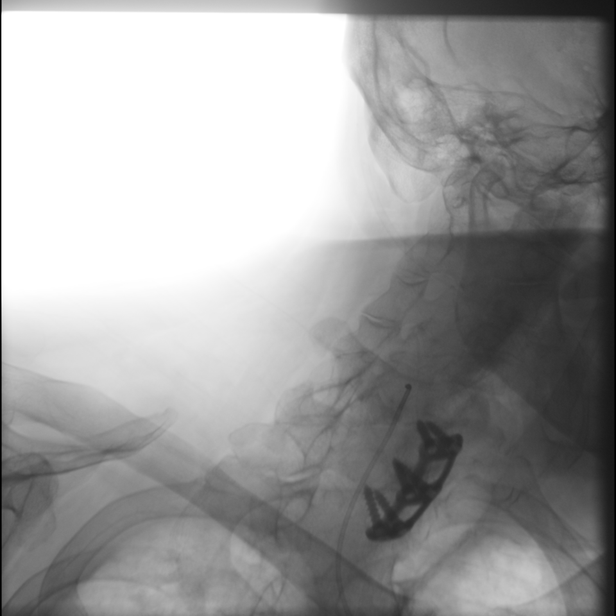

[Series 4: arch · 3 acquisitions, 1 frame shown (3 of 4)]
[im 1/3]
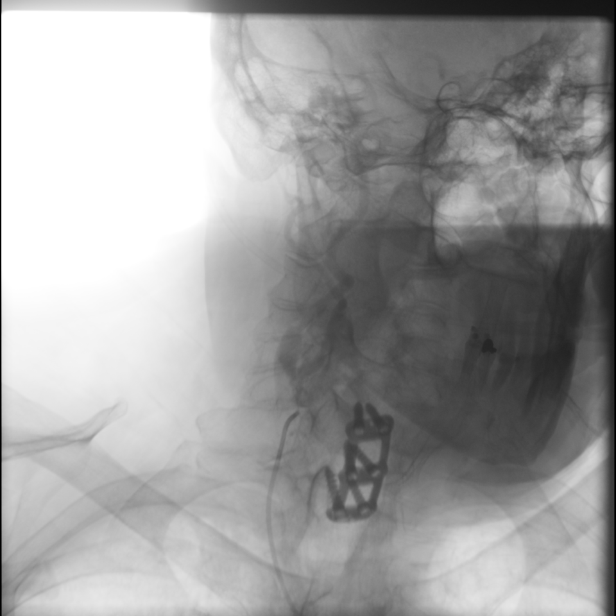

[Series 5: arch · 3 acquisitions, 1 frame shown (4 of 4)]
[im 1/3]
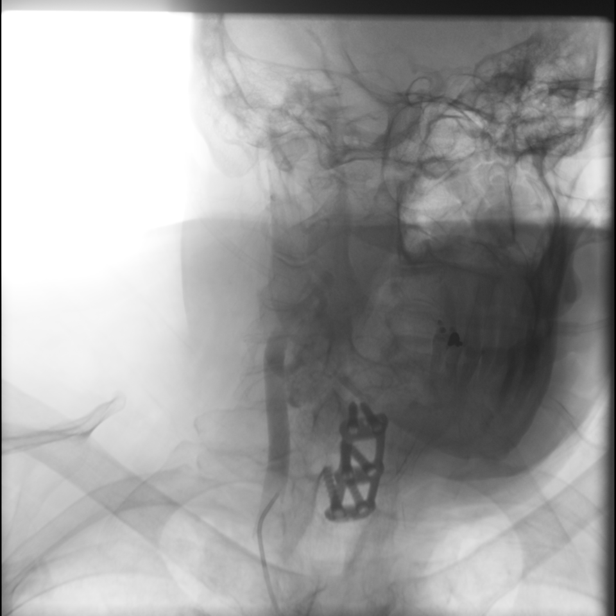

[Series 7: carotid head · 3 acquisitions, 1 frame shown (1 of 11)]
[im 1/3]
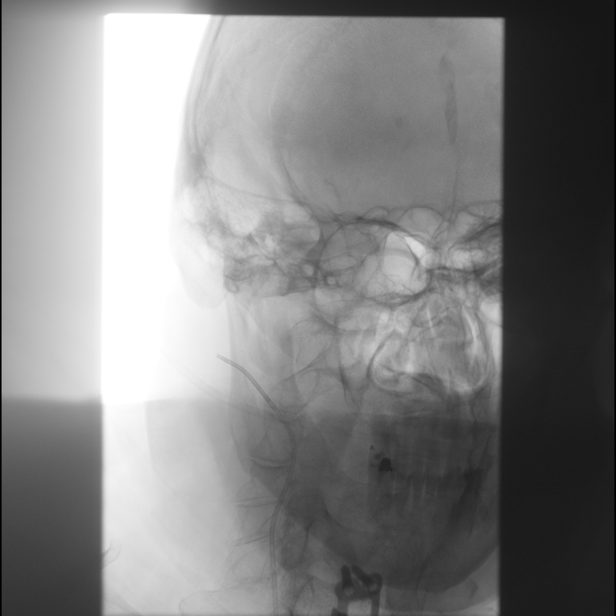

[Series 8: carotid head · 3 acquisitions, 1 frame shown (2 of 11)]
[im 1/3]
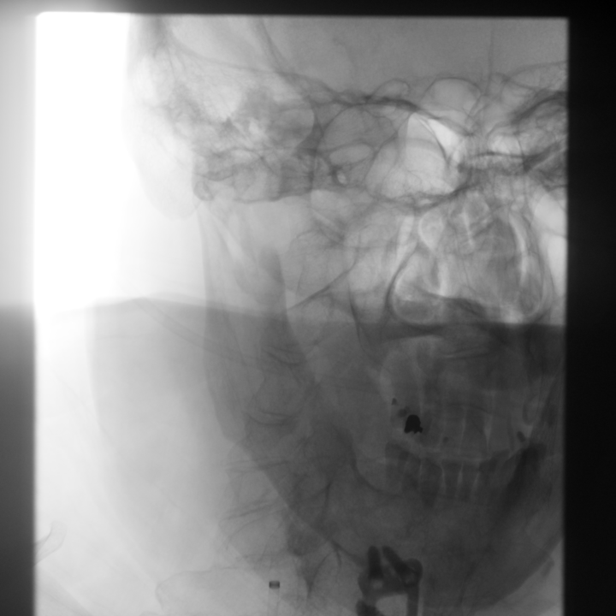

[Series 10: carotid head · 15 acquisitions, 1 frame shown (3 of 11)]
[im 1/15]
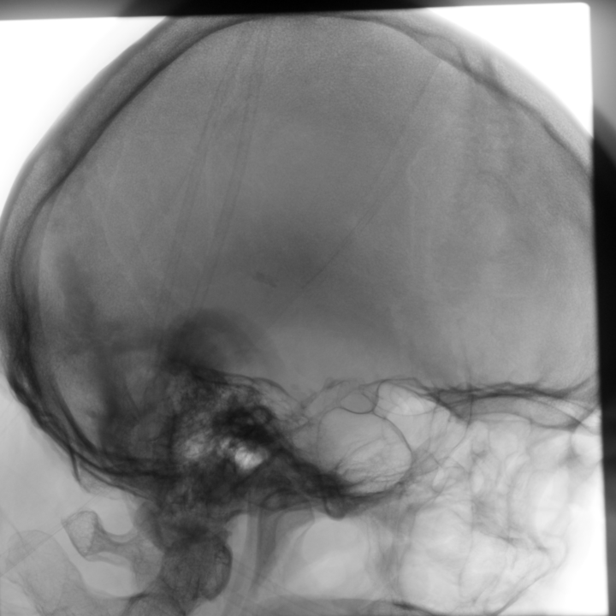

[Series 11: carotid head · 6 acquisitions, 1 frame shown (4 of 11)]
[im 1/6]
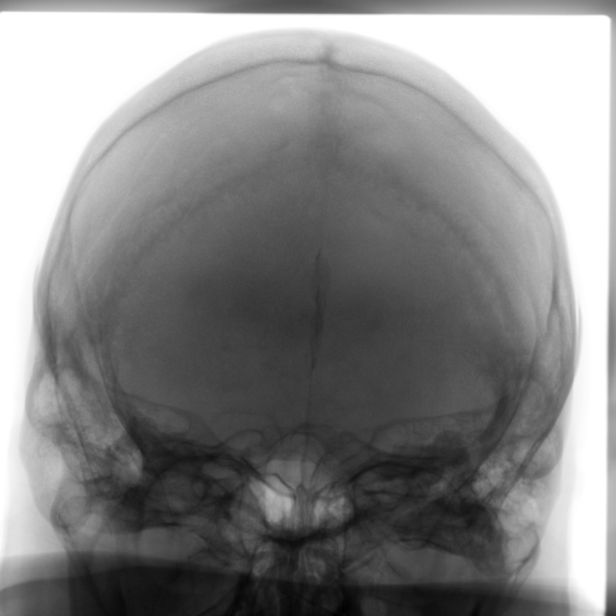

[Series 12: carotid head · 3 acquisitions, 1 frame shown (5 of 11)]
[im 1/3]
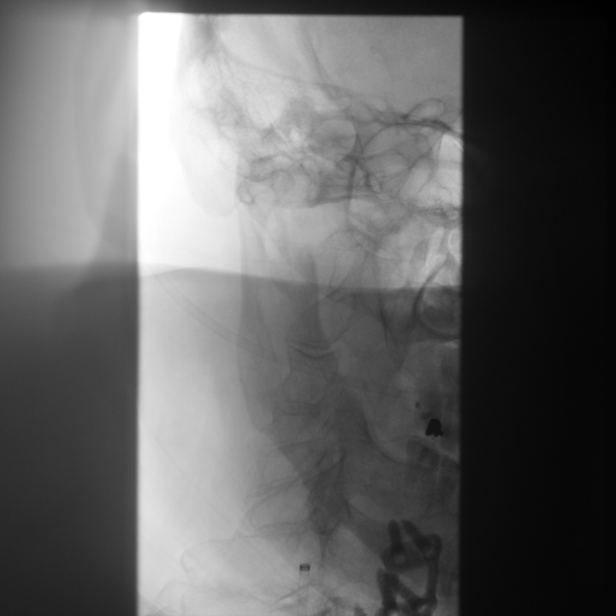

[Series 13: carotid head · 3 acquisitions, 1 frame shown (6 of 11)]
[im 1/3]
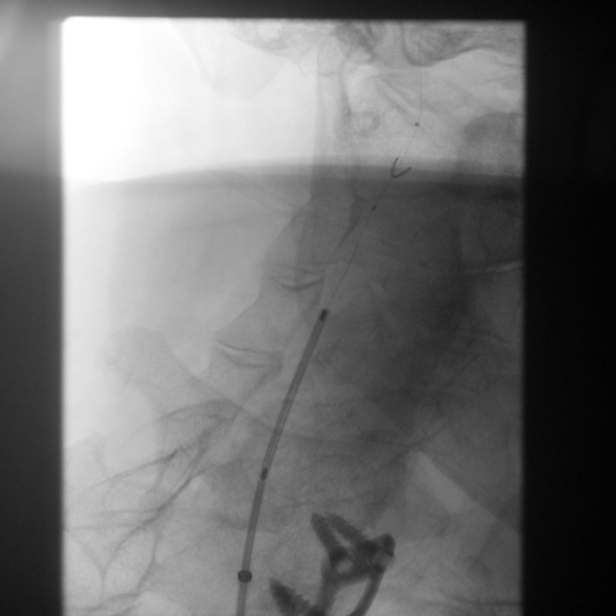

[Series 15: carotid head · 3 acquisitions, 1 frame shown (7 of 11)]
[im 1/3]
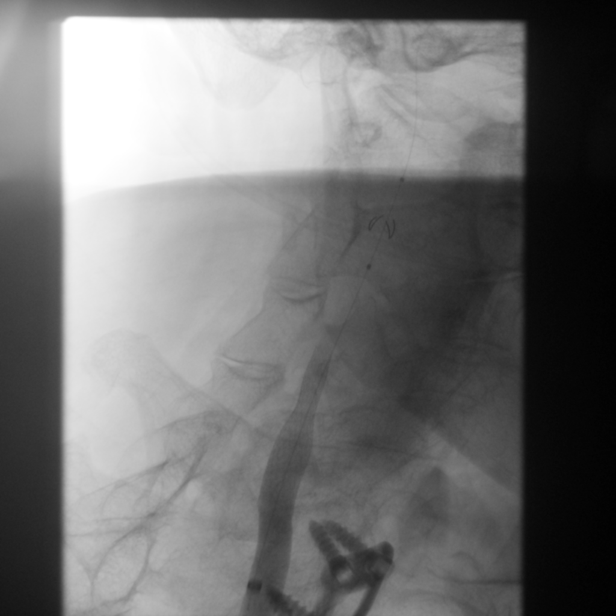

[Series 17: carotid head · 9 acquisitions, 1 frame shown (8 of 11)]
[im 1/9]
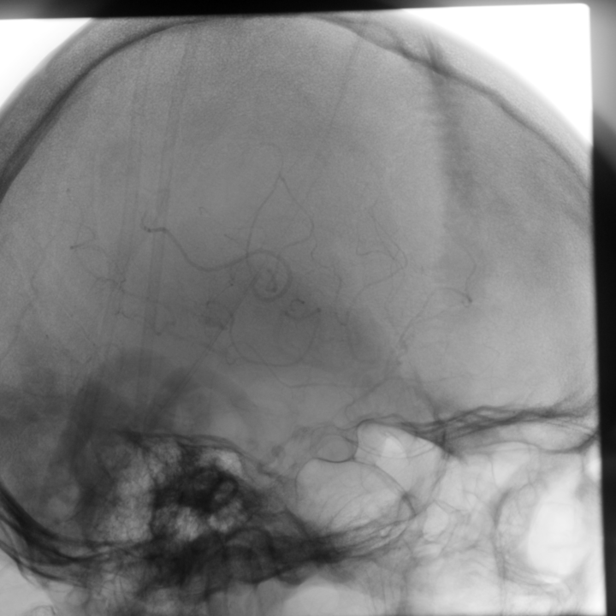

[Series 18: carotid head · 7 acquisitions, 1 frame shown (9 of 11)]
[im 1/7]
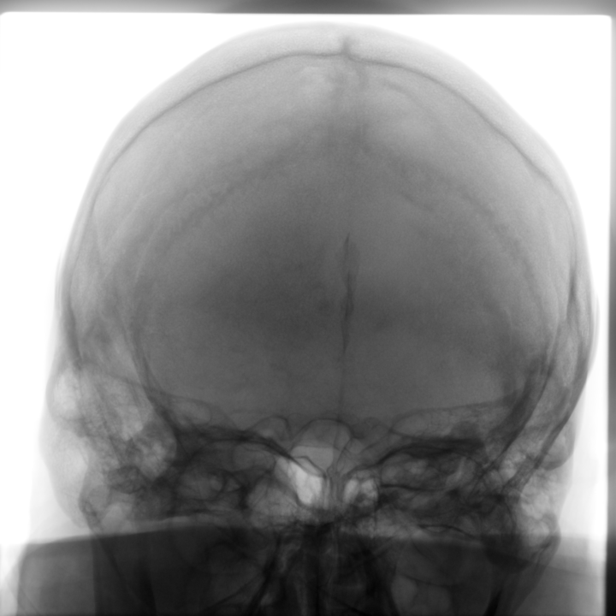

[Series 19: carotid head · 1 of 7 frames shown (10 of 11)]
[frame 6/7]
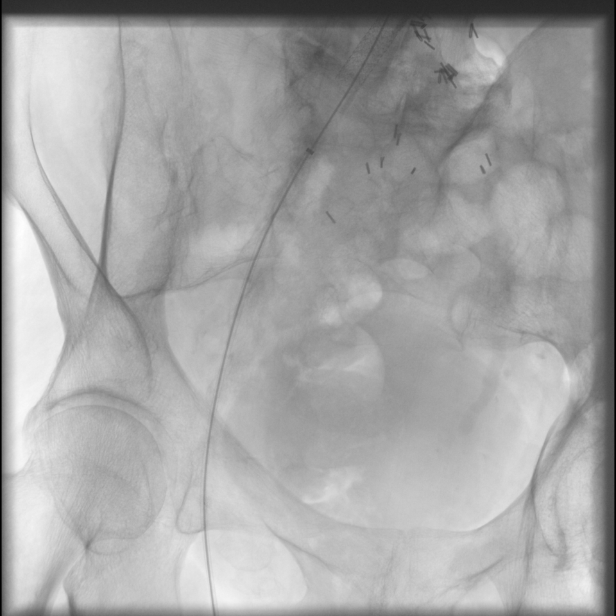

[Series 21: carotid head · 3 acquisitions, 1 frame shown (11 of 11)]
[im 1/3]
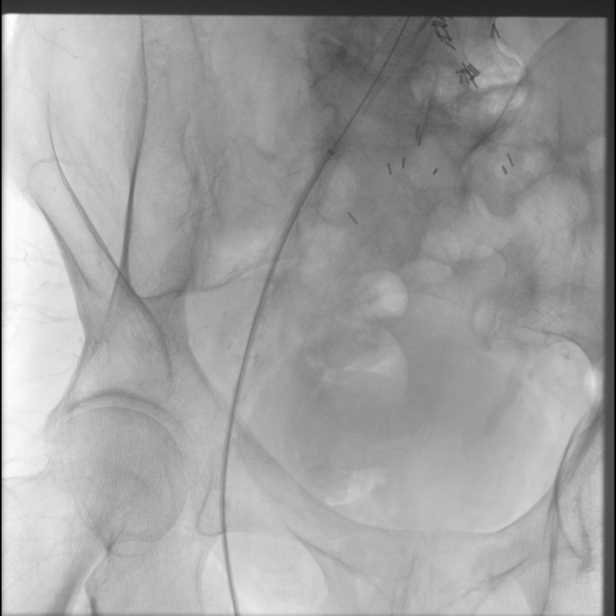

[15 of 24 positions shown; findings below may reference images not displayed]

IMAGES IMPORTED FROM THE [employer] WORKFLOW SYSTEM
NO DICTATION FOR STUDY

## 2013-05-03 LAB — BASIC METABOLIC PANEL
Anion Gap: 10 (ref 7–16)
BUN: 13 mg/dL (ref 7–18)
Chloride: 107 mmol/L (ref 98–107)
Co2: 23 mmol/L (ref 21–32)
Creatinine: 1.17 mg/dL (ref 0.60–1.30)
Glucose: 138 mg/dL — ABNORMAL HIGH (ref 65–99)
Osmolality: 282 (ref 275–301)
Potassium: 3.6 mmol/L (ref 3.5–5.1)
Sodium: 140 mmol/L (ref 136–145)

## 2013-05-03 LAB — CBC WITH DIFFERENTIAL/PLATELET
Basophil #: 0 10*3/uL (ref 0.0–0.1)
Eosinophil #: 0 10*3/uL (ref 0.0–0.7)
Eosinophil %: 0.1 %
HCT: 34.9 % — ABNORMAL LOW (ref 40.0–52.0)
HGB: 12.1 g/dL — ABNORMAL LOW (ref 13.0–18.0)
Monocyte %: 7.5 %
Neutrophil #: 12 10*3/uL — ABNORMAL HIGH (ref 1.4–6.5)
Platelet: 164 10*3/uL (ref 150–440)
RDW: 14.1 % (ref 11.5–14.5)
WBC: 15.6 10*3/uL — ABNORMAL HIGH (ref 3.8–10.6)

## 2013-05-03 LAB — PROTIME-INR: INR: 1.2

## 2013-10-15 ENCOUNTER — Inpatient Hospital Stay: Payer: Self-pay | Admitting: Internal Medicine

## 2013-10-15 LAB — CBC WITH DIFFERENTIAL/PLATELET
Basophil #: 0 10*3/uL (ref 0.0–0.1)
Basophil %: 0.3 %
Eosinophil #: 0 10*3/uL (ref 0.0–0.7)
Eosinophil %: 0.3 %
HCT: 44.1 % (ref 40.0–52.0)
HGB: 14.8 g/dL (ref 13.0–18.0)
Lymphocyte #: 0.5 10*3/uL — ABNORMAL LOW (ref 1.0–3.6)
Lymphocyte %: 7.1 %
MCH: 31 pg (ref 26.0–34.0)
MCHC: 33.6 g/dL (ref 32.0–36.0)
MCV: 92 fL (ref 80–100)
Monocyte #: 0.9 x10 3/mm (ref 0.2–1.0)
Monocyte %: 13 %
NEUTROS ABS: 5.5 10*3/uL (ref 1.4–6.5)
Neutrophil %: 79.3 %
PLATELETS: 161 10*3/uL (ref 150–440)
RBC: 4.78 10*6/uL (ref 4.40–5.90)
RDW: 13.7 % (ref 11.5–14.5)
WBC: 7 10*3/uL (ref 3.8–10.6)

## 2013-10-15 LAB — BASIC METABOLIC PANEL
Anion Gap: 10 (ref 7–16)
BUN: 11 mg/dL (ref 7–18)
CO2: 21 mmol/L (ref 21–32)
Calcium, Total: 9.2 mg/dL (ref 8.5–10.1)
Chloride: 91 mmol/L — ABNORMAL LOW (ref 98–107)
Creatinine: 1.04 mg/dL (ref 0.60–1.30)
EGFR (African American): >60
Glucose: 151 mg/dL — ABNORMAL HIGH (ref 65–99)
OSMOLALITY: 248 (ref 275–301)
Potassium: 4.1 mmol/L (ref 3.5–5.1)
SODIUM: 122 mmol/L — AB (ref 136–145)

## 2013-10-15 LAB — URINALYSIS, COMPLETE
BACTERIA: NONE SEEN
Bilirubin,UR: NEGATIVE
Glucose,UR: NEGATIVE mg/dL (ref 0–75)
Leukocyte Esterase: NEGATIVE
Nitrite: NEGATIVE
Ph: 7 (ref 4.5–8.0)
RBC,UR: 7 /HPF (ref 0–5)
SPECIFIC GRAVITY: 1.012 (ref 1.003–1.030)
Squamous Epithelial: NONE SEEN
WBC UR: 1 /HPF (ref 0–5)

## 2013-10-15 LAB — TROPONIN I

## 2013-10-15 IMAGING — CT CT HEAD WITHOUT CONTRAST
1 of 2 series · 16 of 30 positions shown, 20 images · non-contrast
Comparison: [DATE]

CLINICAL DATA: Headache, history of bladder cancer

EXAM:
CT HEAD WITHOUT CONTRAST
TECHNIQUE: Contiguous axial images were obtained from the base of the skull
through the vertex without intravenous contrast.

[Series 2: head wo · axial · 0.46mm/px · z∈[-179,-44]mm · 16 of 34 slices shown, 20 images]
[im 2/34  brain]
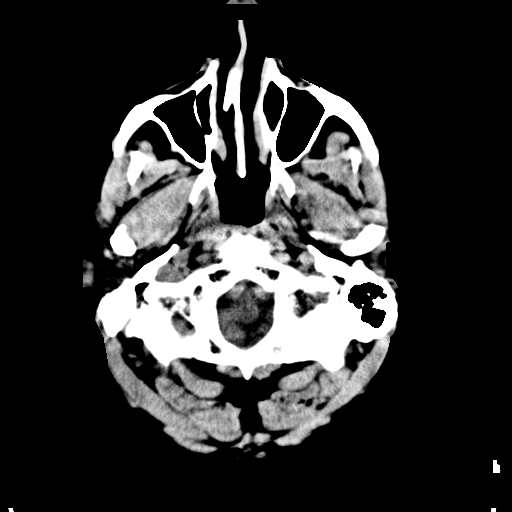
[im 2/34  bone]
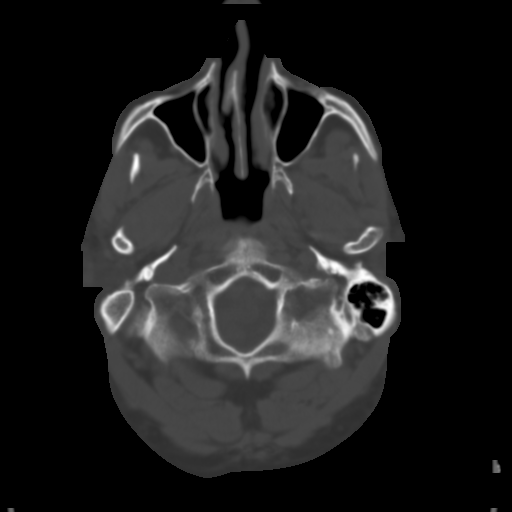
[im 4/34  brain]
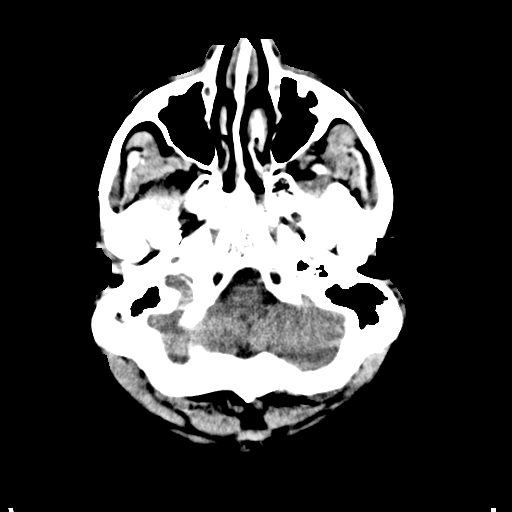
[im 5/34  brain]
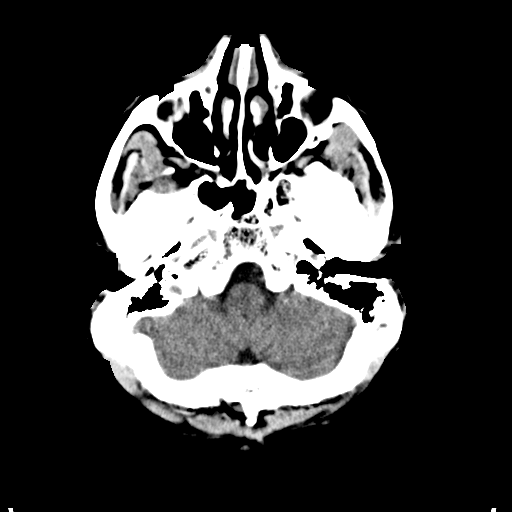
[im 9/34  brain]
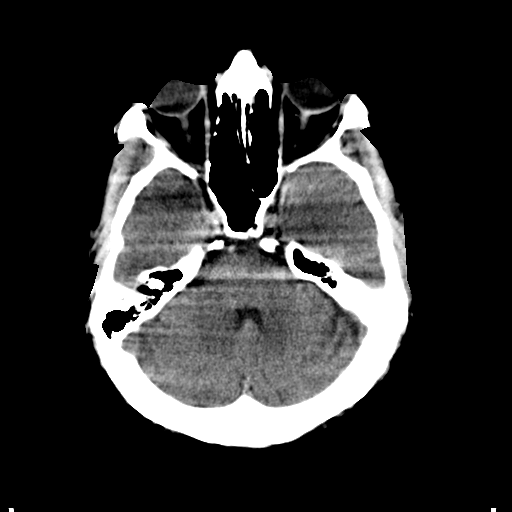
[im 10/34  brain]
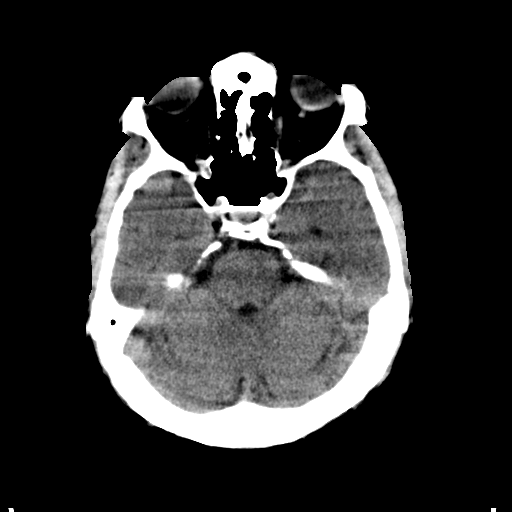
[im 10/34  bone]
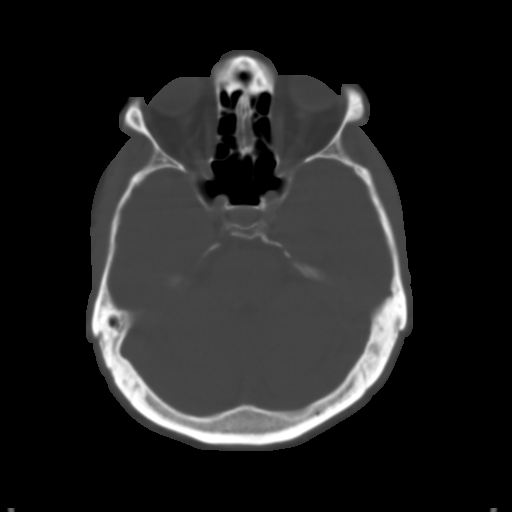
[im 12/34  brain]
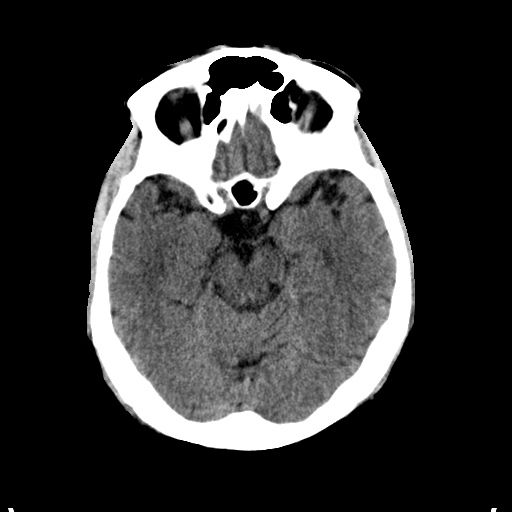
[im 14/34  brain]
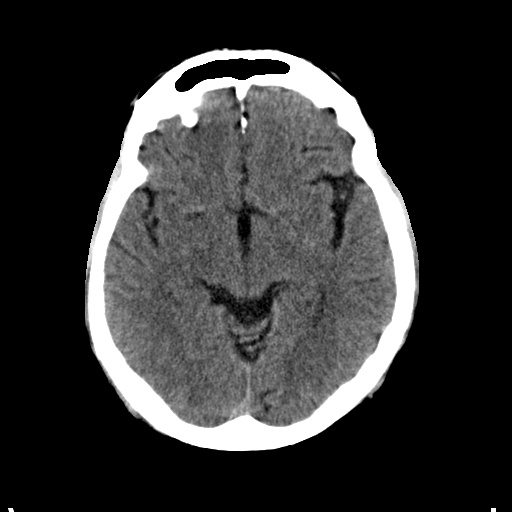
[im 15/34  brain]
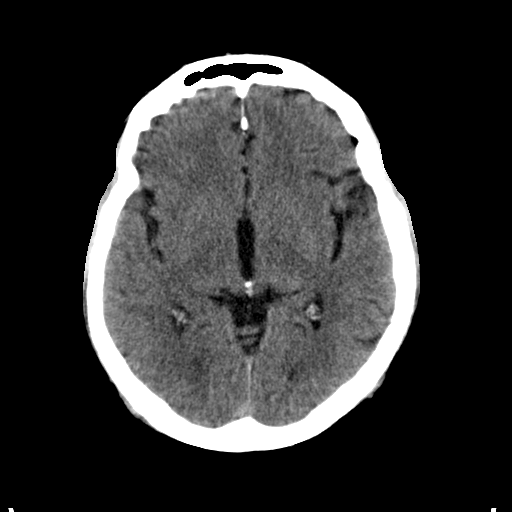
[im 19/34  brain]
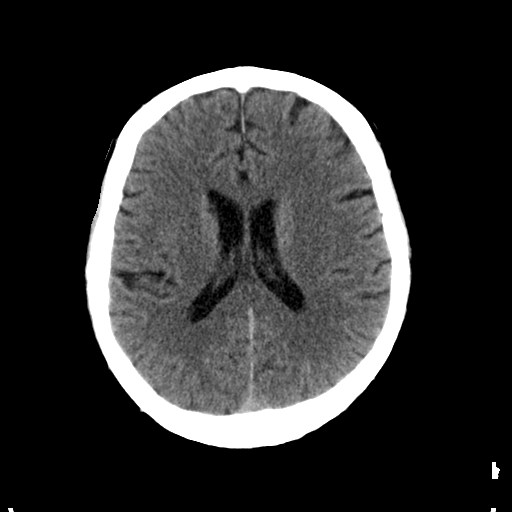
[im 19/34  bone]
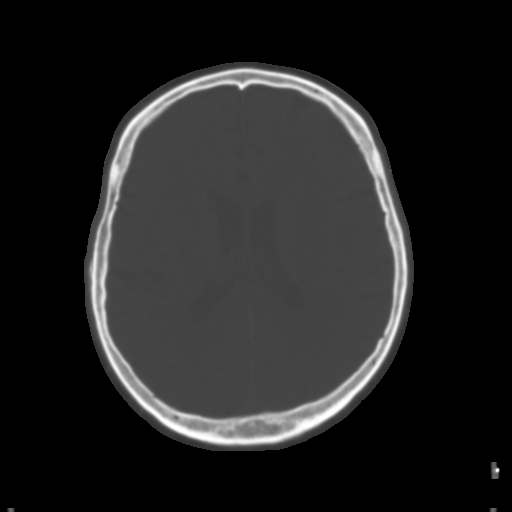
[im 20/34  brain]
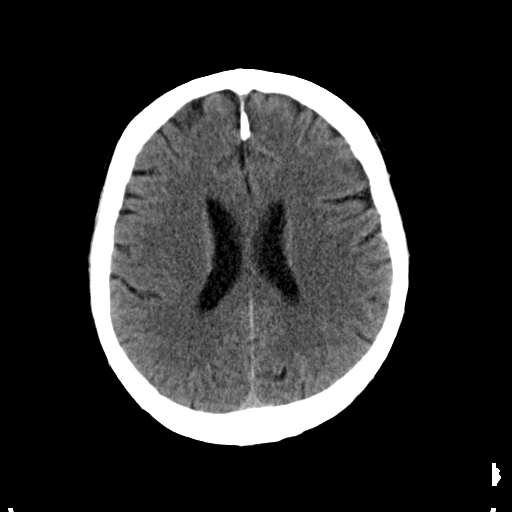
[im 22/34  brain]
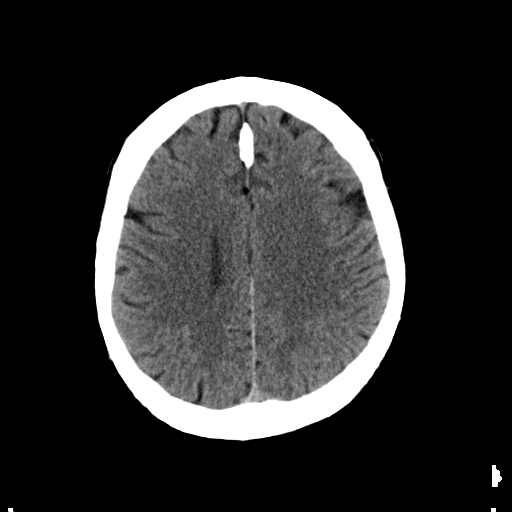
[im 24/34  brain]
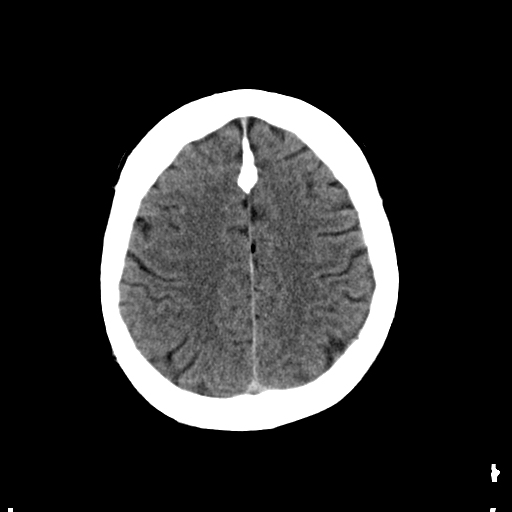
[im 25/34  brain]
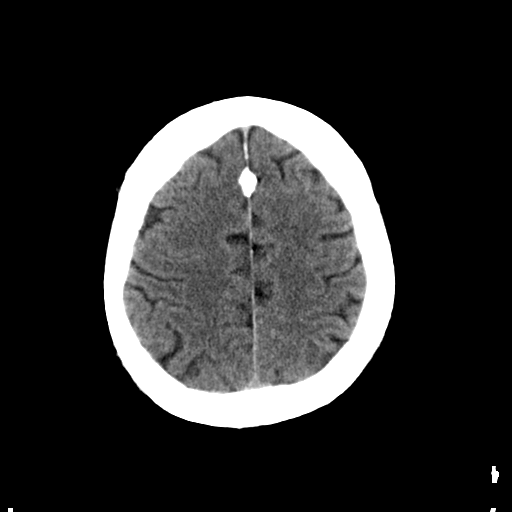
[im 25/34  bone]
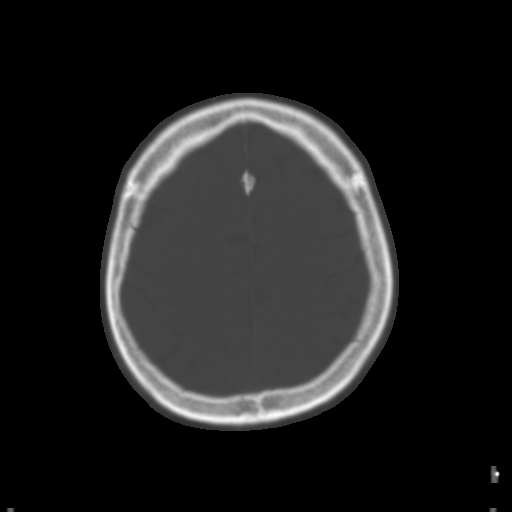
[im 29/34  brain]
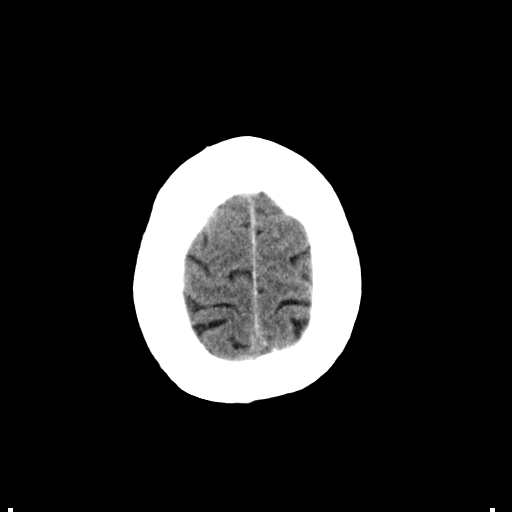
[im 30/34  brain]
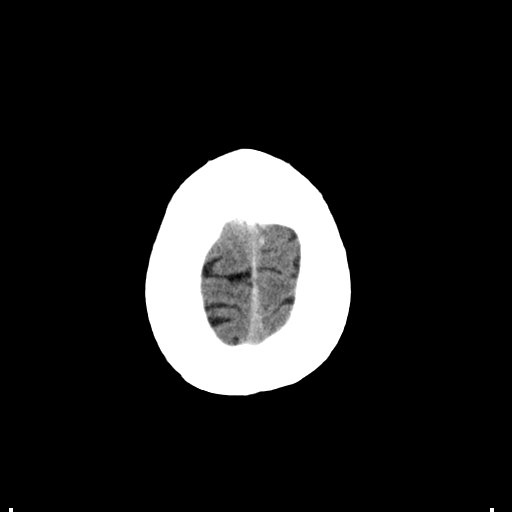
[im 32/34  brain]
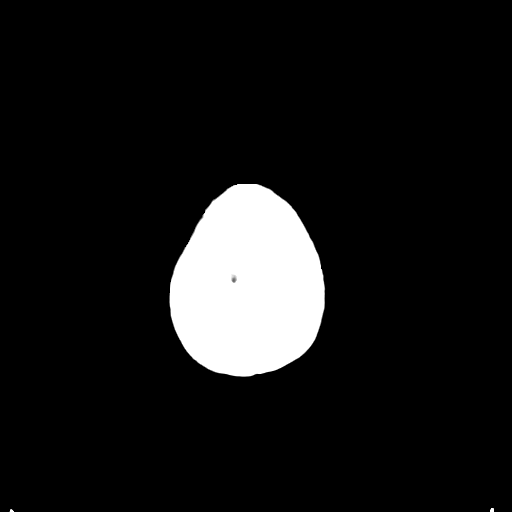

[16 of 30 positions shown; findings below may reference images not displayed]

FINDINGS: No evidence of parenchymal hemorrhage or extra-axial fluid
collection. No mass lesion, mass effect, or midline shift.

No CT evidence of acute infarction.

Mild small vessel ischemic changes.

Cerebral volume is within normal limits.  No ventriculomegaly.

The visualized paranasal sinuses are essentially clear. The mastoid
air cells are unopacified.

No evidence of calvarial fracture.
IMPRESSION: No evidence of acute intracranial abnormality.

Mild small vessel ischemic changes.

## 2013-10-15 IMAGING — CR DG CHEST 2V
1 series · 2 of 2 positions shown · non-contrast
Comparison: [DATE].

CLINICAL DATA: Cough and congestion.  Fever.

EXAM:
CHEST  2 VIEW

[Series 1: w chest pa · 0.14mm/px · 2 of 2 slices shown]
[im 1/2]
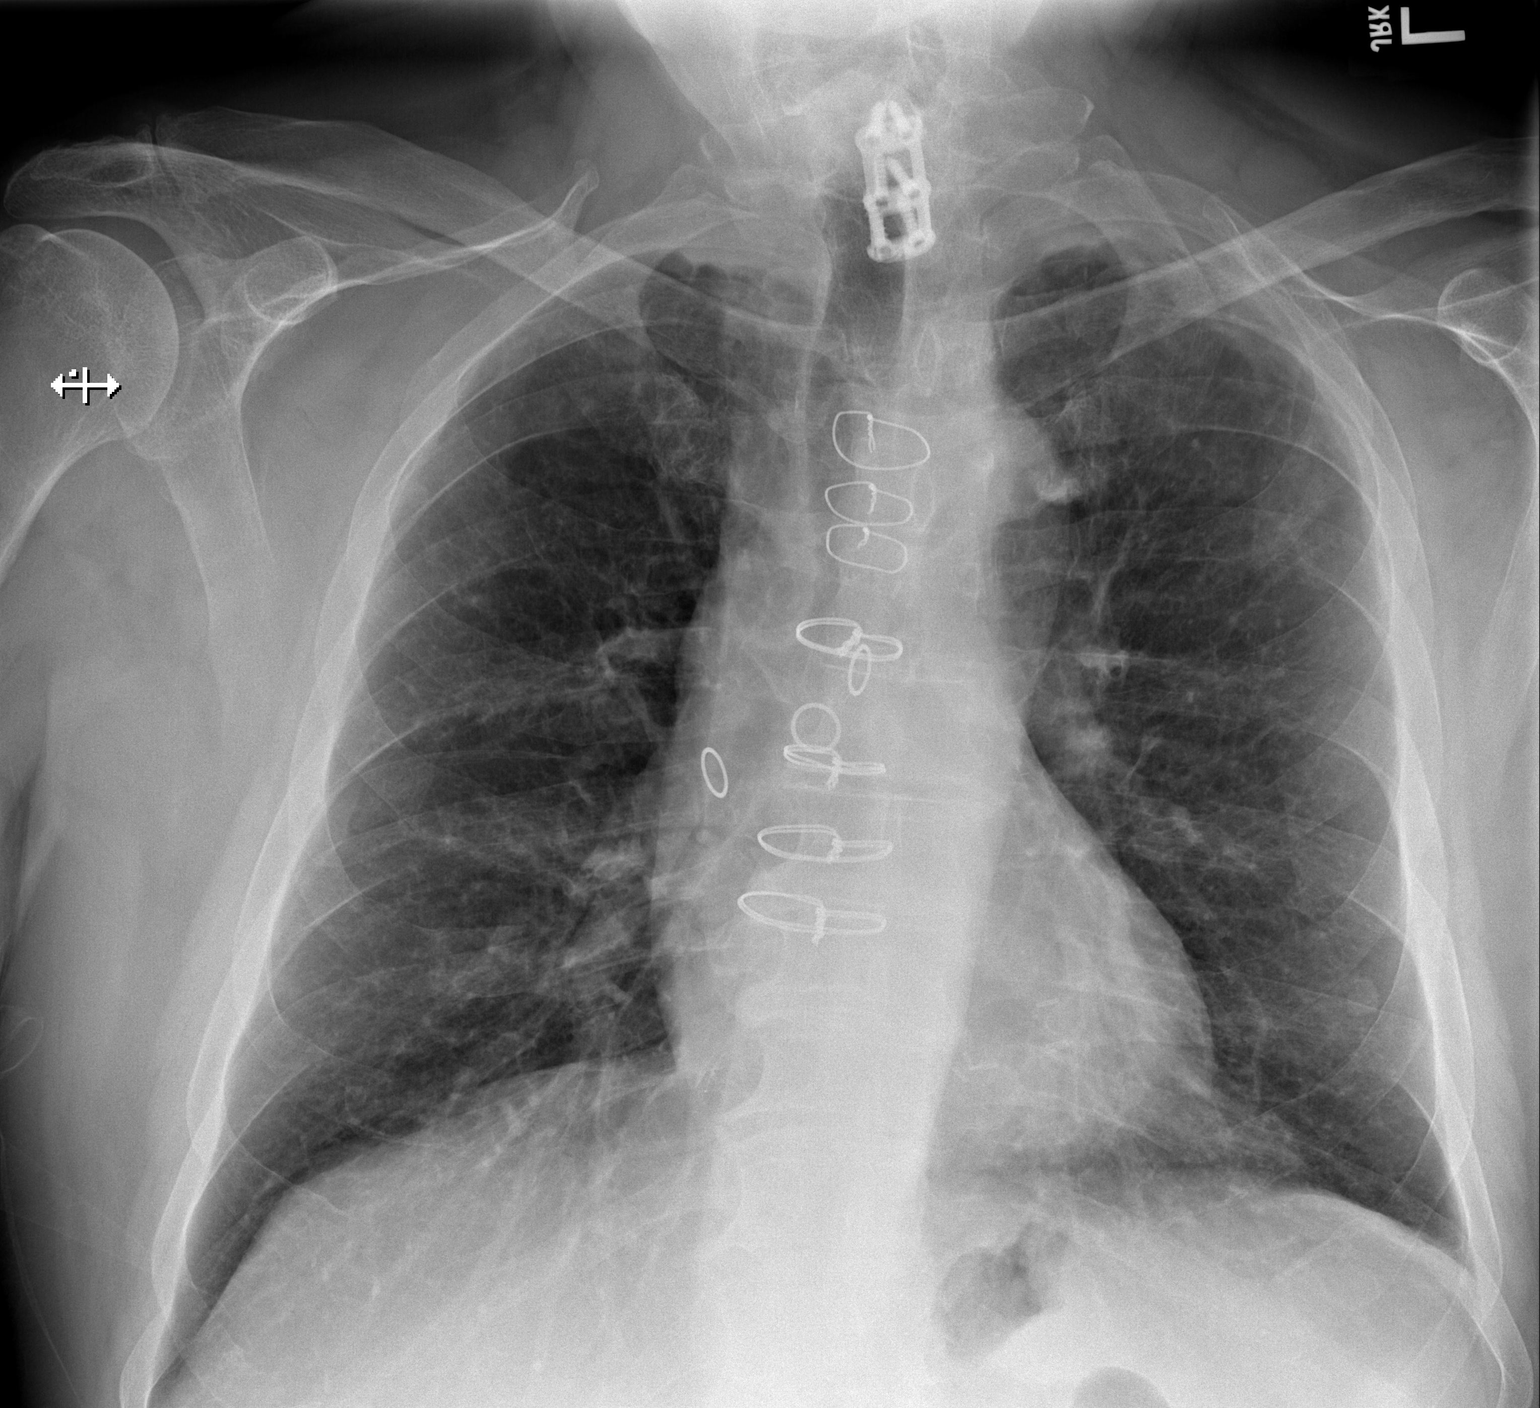
[im 2/2]
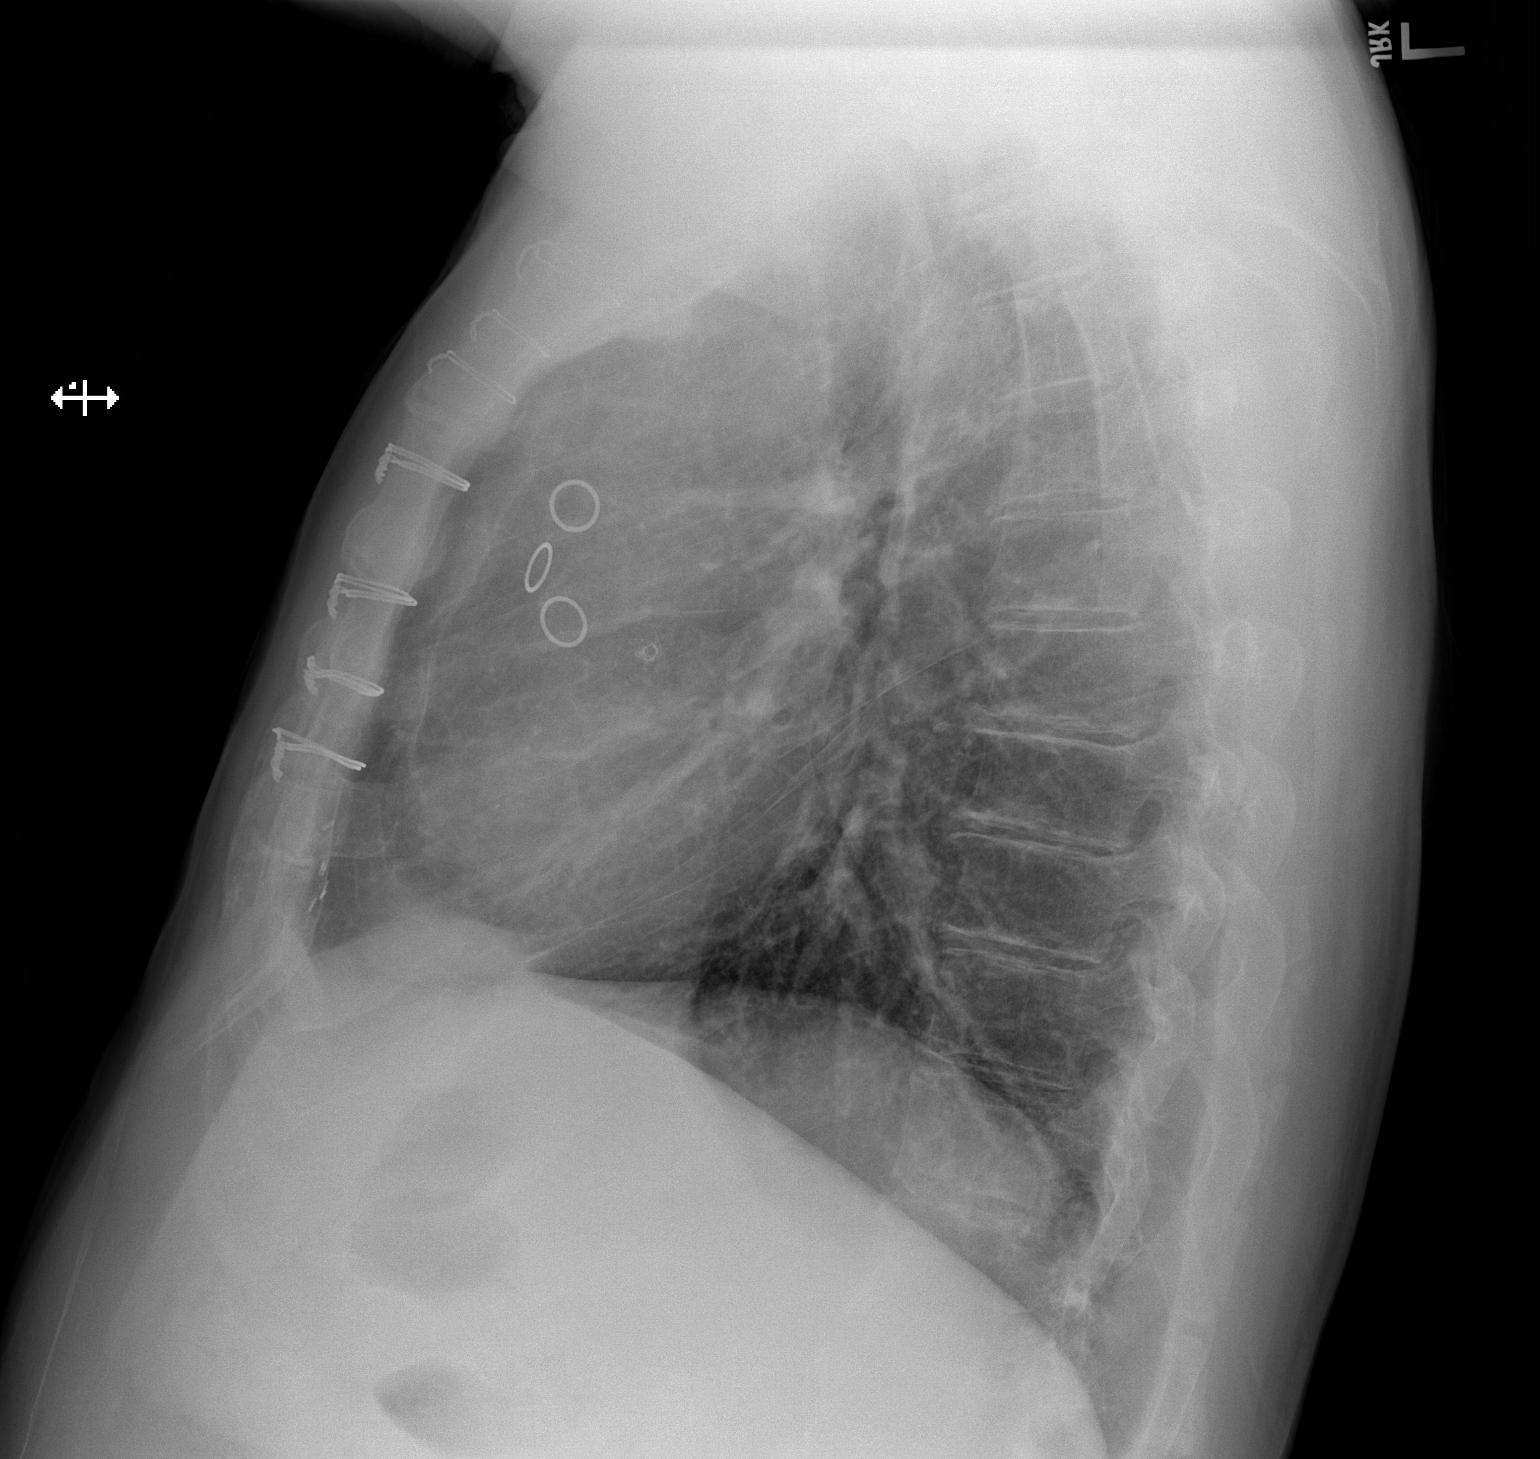

[2 of 2 positions shown; findings below may reference images not displayed]

FINDINGS: Changes from CABG surgery are stable. Cardiac silhouette is normal
size. Normal mediastinal and hilar contours

There is stable bilateral mild apical pleural parenchymal scarring.
Lungs are otherwise clear. No pleural effusion. No pneumothorax.

There has been a previous low anterior cervical spine fusion. Bony
thorax is demineralized but grossly intact.
IMPRESSION: No acute cardiopulmonary disease.

## 2013-10-16 LAB — TSH: Thyroid Stimulating Horm: 0.255 u[IU]/mL — ABNORMAL LOW

## 2013-10-16 LAB — CBC WITH DIFFERENTIAL/PLATELET
BASOS ABS: 0 10*3/uL (ref 0.0–0.1)
BASOS PCT: 0.2 %
Eosinophil #: 0 10*3/uL (ref 0.0–0.7)
Eosinophil %: 0 %
HCT: 42.4 % (ref 40.0–52.0)
HGB: 14.4 g/dL (ref 13.0–18.0)
Lymphocyte #: 0.3 10*3/uL — ABNORMAL LOW (ref 1.0–3.6)
Lymphocyte %: 6.6 %
MCH: 31.1 pg (ref 26.0–34.0)
MCHC: 33.9 g/dL (ref 32.0–36.0)
MCV: 92 fL (ref 80–100)
MONO ABS: 0.1 x10 3/mm — AB (ref 0.2–1.0)
Monocyte %: 3.1 %
NEUTROS ABS: 3.6 10*3/uL (ref 1.4–6.5)
Neutrophil %: 90.1 %
PLATELETS: 137 10*3/uL — AB (ref 150–440)
RBC: 4.63 10*6/uL (ref 4.40–5.90)
RDW: 13.7 % (ref 11.5–14.5)
WBC: 4 10*3/uL (ref 3.8–10.6)

## 2013-10-16 LAB — BASIC METABOLIC PANEL
Anion Gap: 10 (ref 7–16)
BUN: 13 mg/dL (ref 7–18)
CALCIUM: 8.6 mg/dL (ref 8.5–10.1)
CHLORIDE: 93 mmol/L — AB (ref 98–107)
Co2: 23 mmol/L (ref 21–32)
Creatinine: 1.15 mg/dL (ref 0.60–1.30)
EGFR (African American): >60
GLUCOSE: 229 mg/dL — AB (ref 65–99)
Osmolality: 261 (ref 275–301)
Potassium: 3.3 mmol/L — ABNORMAL LOW (ref 3.5–5.1)
SODIUM: 126 mmol/L — AB (ref 136–145)

## 2013-10-16 LAB — MAGNESIUM: Magnesium: 1.4 mg/dL — ABNORMAL LOW

## 2013-10-16 LAB — T4, FREE: Free Thyroxine: 1.32 ng/dL (ref 0.76–1.46)

## 2013-10-16 LAB — HEMOGLOBIN A1C: HEMOGLOBIN A1C: 7.2 % — AB (ref 4.2–6.3)

## 2013-10-16 LAB — LIPID PANEL
Cholesterol: 136 mg/dL (ref 0–200)
HDL Cholesterol: 37 mg/dL — ABNORMAL LOW (ref 40–60)
Ldl Cholesterol, Calc: 78 mg/dL (ref 0–100)
TRIGLYCERIDES: 103 mg/dL (ref 0–200)
VLDL CHOLESTEROL, CALC: 21 mg/dL (ref 5–40)

## 2013-10-17 LAB — BASIC METABOLIC PANEL
Anion Gap: 7 (ref 7–16)
BUN: 25 mg/dL — ABNORMAL HIGH (ref 7–18)
CALCIUM: 8.8 mg/dL (ref 8.5–10.1)
CHLORIDE: 102 mmol/L (ref 98–107)
CO2: 24 mmol/L (ref 21–32)
Creatinine: 1.47 mg/dL — ABNORMAL HIGH (ref 0.60–1.30)
EGFR (African American): 56 — ABNORMAL LOW
GFR CALC NON AF AMER: 48 — AB
Glucose: 215 mg/dL — ABNORMAL HIGH (ref 65–99)
OSMOLALITY: 277 (ref 275–301)
Potassium: 4.2 mmol/L (ref 3.5–5.1)
SODIUM: 133 mmol/L — AB (ref 136–145)

## 2013-10-18 LAB — BASIC METABOLIC PANEL
Anion Gap: 6 — ABNORMAL LOW (ref 7–16)
BUN: 25 mg/dL — ABNORMAL HIGH (ref 7–18)
CALCIUM: 8.3 mg/dL — AB (ref 8.5–10.1)
Chloride: 105 mmol/L (ref 98–107)
Co2: 22 mmol/L (ref 21–32)
Creatinine: 1.35 mg/dL — ABNORMAL HIGH (ref 0.60–1.30)
GFR CALC NON AF AMER: 54 — AB
Glucose: 255 mg/dL — ABNORMAL HIGH (ref 65–99)
OSMOLALITY: 279 (ref 275–301)
Potassium: 4.4 mmol/L (ref 3.5–5.1)
SODIUM: 133 mmol/L — AB (ref 136–145)

## 2013-10-22 LAB — CBC
HCT: 44.3 % (ref 40.0–52.0)
HGB: 14.6 g/dL (ref 13.0–18.0)
MCH: 30.5 pg (ref 26.0–34.0)
MCHC: 33 g/dL (ref 32.0–36.0)
MCV: 92 fL (ref 80–100)
Platelet: 198 10*3/uL (ref 150–440)
RBC: 4.8 10*6/uL (ref 4.40–5.90)
RDW: 13.8 % (ref 11.5–14.5)
WBC: 12.3 10*3/uL — ABNORMAL HIGH (ref 3.8–10.6)

## 2013-10-22 LAB — BASIC METABOLIC PANEL
Anion Gap: 11 (ref 7–16)
BUN: 25 mg/dL — ABNORMAL HIGH (ref 7–18)
CALCIUM: 8.9 mg/dL (ref 8.5–10.1)
CHLORIDE: 95 mmol/L — AB (ref 98–107)
Co2: 26 mmol/L (ref 21–32)
Creatinine: 1.58 mg/dL — ABNORMAL HIGH (ref 0.60–1.30)
EGFR (Non-African Amer.): 44 — ABNORMAL LOW
GFR CALC AF AMER: 51 — AB
Glucose: 215 mg/dL — ABNORMAL HIGH (ref 65–99)
Osmolality: 275 (ref 275–301)
POTASSIUM: 4 mmol/L (ref 3.5–5.1)
SODIUM: 132 mmol/L — AB (ref 136–145)

## 2013-10-22 LAB — PROTIME-INR
INR: 1
Prothrombin Time: 13.1 secs (ref 11.5–14.7)

## 2013-10-22 LAB — APTT: ACTIVATED PTT: 27.4 s (ref 23.6–35.9)

## 2013-10-22 LAB — CK-MB
CK-MB: 3 ng/mL (ref 0.5–3.6)
CK-MB: 4.4 ng/mL — AB (ref 0.5–3.6)
CK-MB: 4.6 ng/mL — ABNORMAL HIGH (ref 0.5–3.6)

## 2013-10-22 LAB — TROPONIN I
TROPONIN-I: 0.67 ng/mL — AB
Troponin-I: 1.1 ng/mL — ABNORMAL HIGH
Troponin-I: 1 ng/mL — ABNORMAL HIGH

## 2013-10-22 LAB — PRO B NATRIURETIC PEPTIDE: B-TYPE NATIURETIC PEPTID: 463 pg/mL — AB (ref 0–125)

## 2013-10-22 IMAGING — CR DG CHEST 1V PORT
1 series · 1 of 1 positions shown · non-contrast
Comparison: DG CHEST 2V dated [DATE];

CLINICAL DATA: Pt c/o chest pain starting at midnight, sitting and
watching game on tv.. Pt states started in L arm, radiating to L
chest. Pt pale and slightly diaphoretic

EXAM:
PORTABLE CHEST - 1 VIEW

[ap]
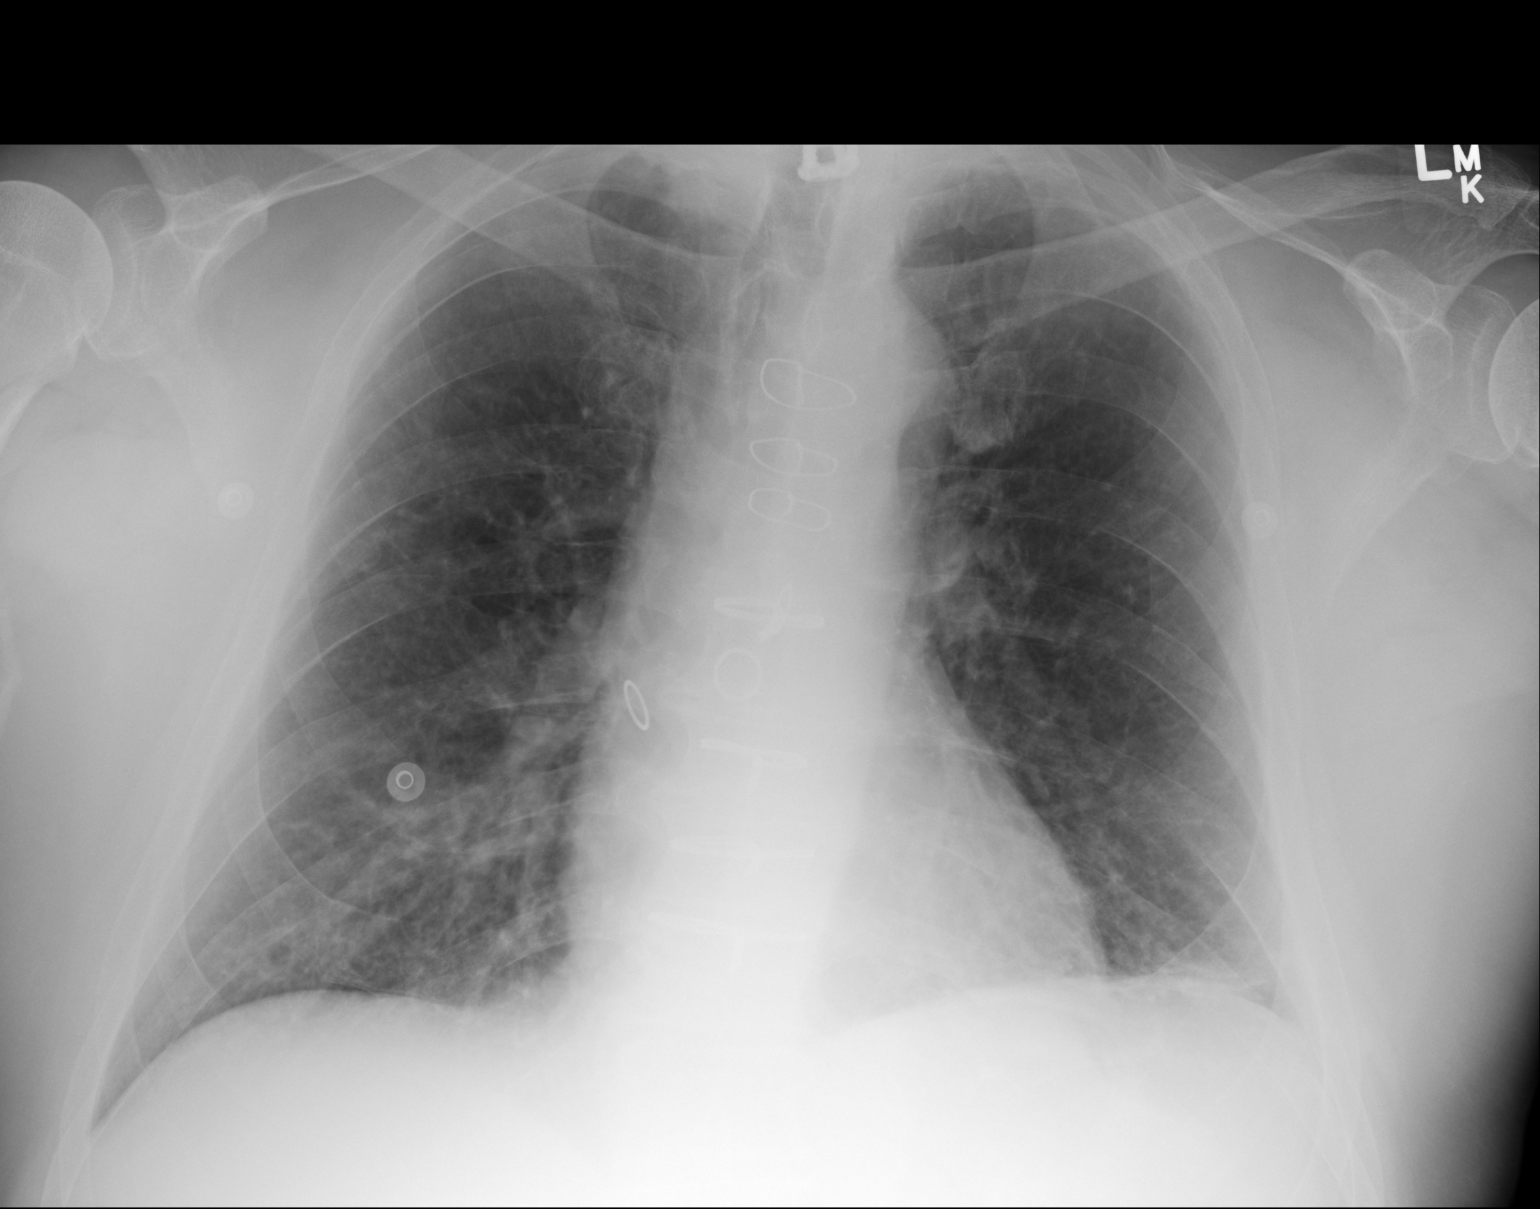

[1 of 1 positions shown; findings below may reference images not displayed]

DG CHEST 1V PORT dated
[DATE]; DG CHEST 1V PORT dated [DATE]; DG CHEST 1V PORT dated
[DATE]; DG CHEST 1V PORT dated [DATE]; DG CHEST 2V dated
[DATE]
FINDINGS: Lower cervical ACDF. CABG/ median sternotomy. Basilar atelectasis is
present. Lung volumes are lower than on the prior examination. Faint
increased density is present at both lung bases, most consistent
with atelectasis. Cardiopericardial silhouette and mediastinal
contours appear unchanged compared to prior.
IMPRESSION: No definite acute cardiopulmonary disease. Bilateral basilar
opacities are favored to represent atelectasis.

## 2013-10-22 IMAGING — CT CT ANGIO CHEST
2 of 6 series · 18 of 36 positions shown · IV contrast (isovue)
Comparison: [DATE]

CLINICAL DATA: Chest pain and tachycardia.

EXAM:
CT ANGIOGRAPHY CHEST WITH CONTRAST
TECHNIQUE: Multidetector CT imaging of the chest was performed using the
standard protocol during bolus administration of intravenous
contrast. Multiplanar CT image reconstructions and MIPs were
obtained to evaluate the vascular anatomy.
CONTRAST:  75 cc Isovue 370 intravenous

[Series 5: pe 1.0 thins · axial · 0.75mm/px · z∈[-358,-96]mm · 17 of 296 slices shown]
[im 17/296  lung]
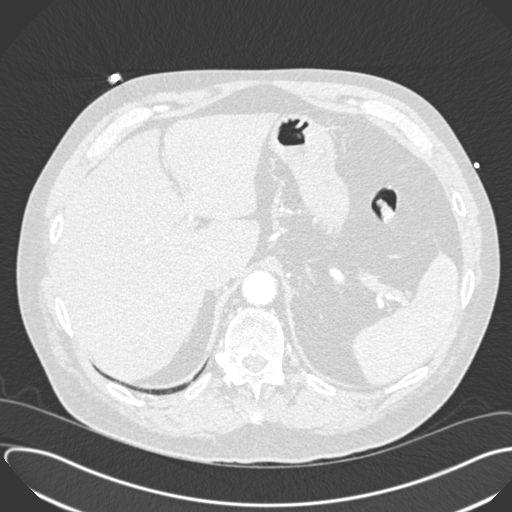
[im 33/296  mediastinal]
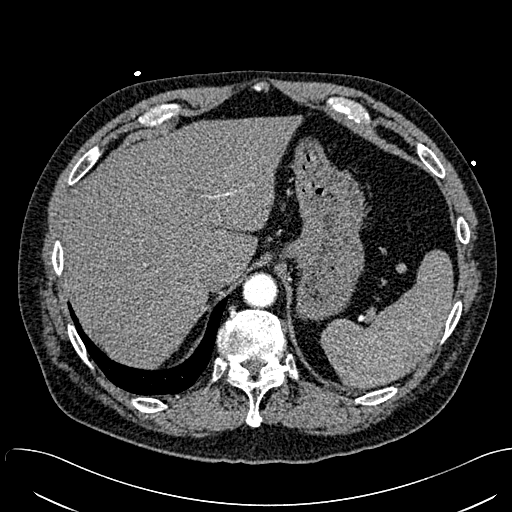
[im 50/296  lung]
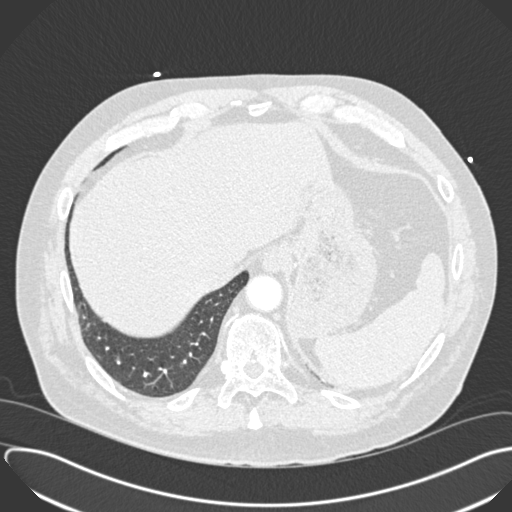
[im 66/296  mediastinal]
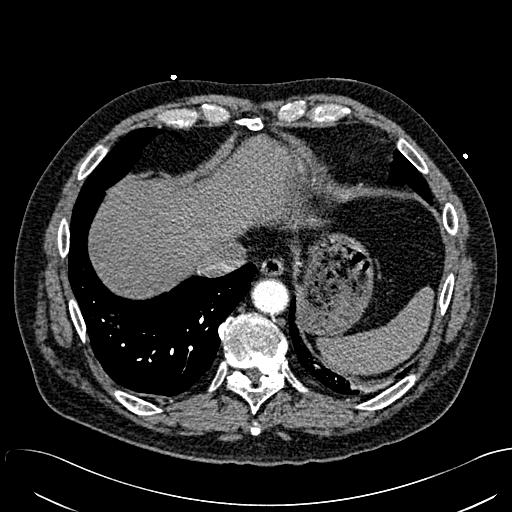
[im 82/296  lung]
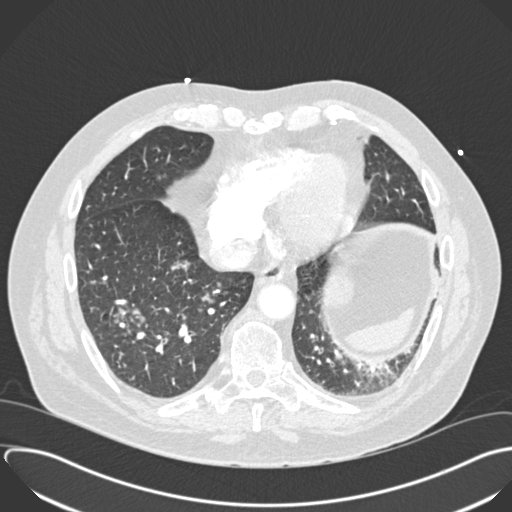
[im 99/296  mediastinal]
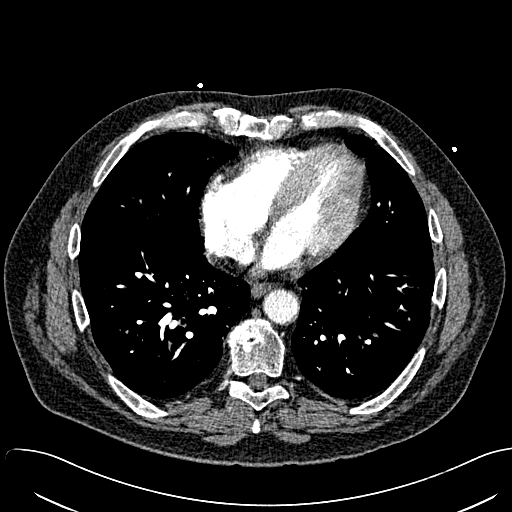
[im 115/296  lung]
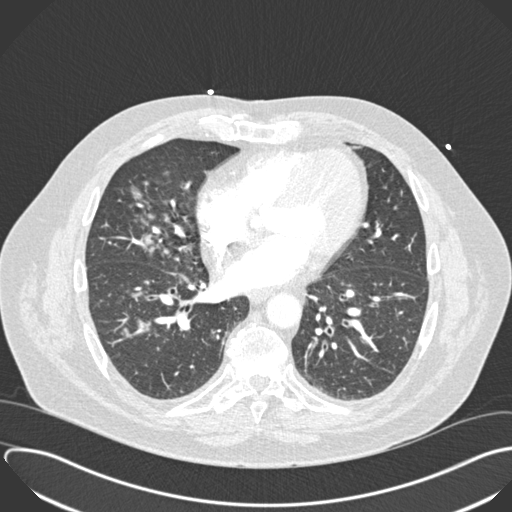
[im 132/296  mediastinal]
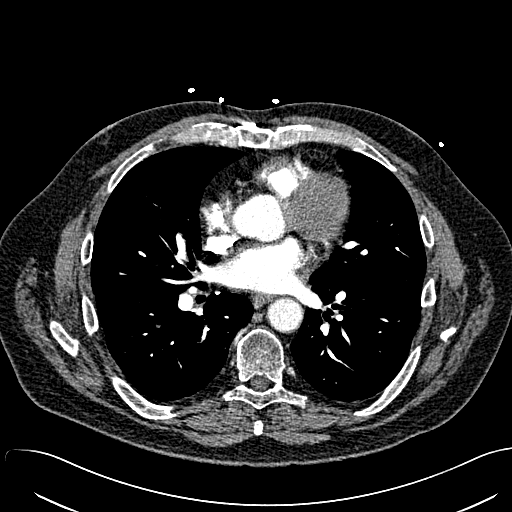
[im 148/296  lung]
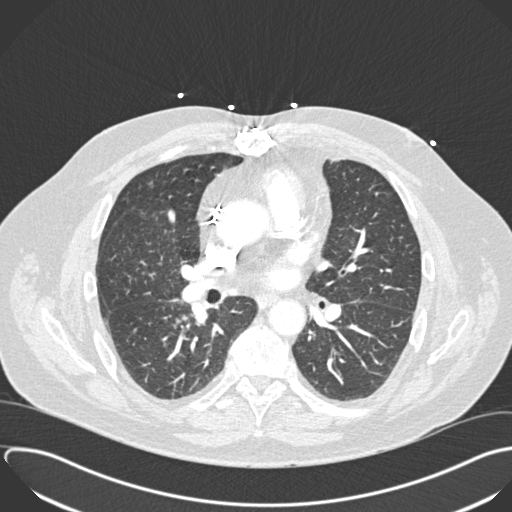
[im 164/296  mediastinal]
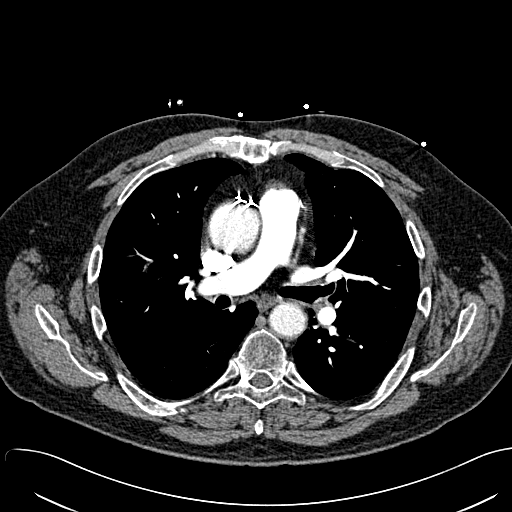
[im 181/296  lung]
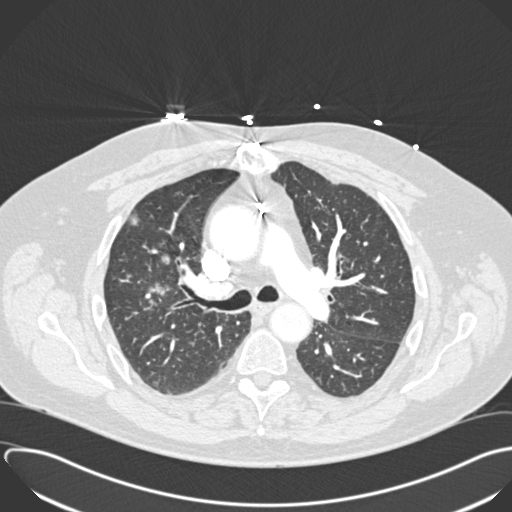
[im 197/296  mediastinal]
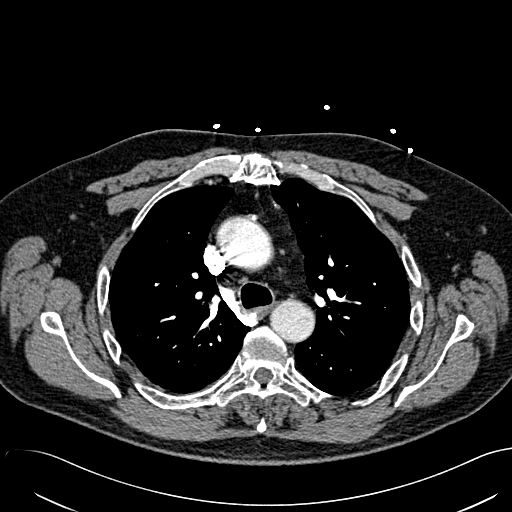
[im 214/296  lung]
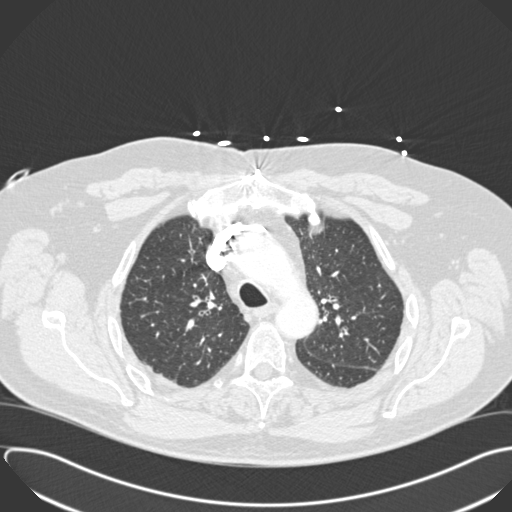
[im 230/296  mediastinal]
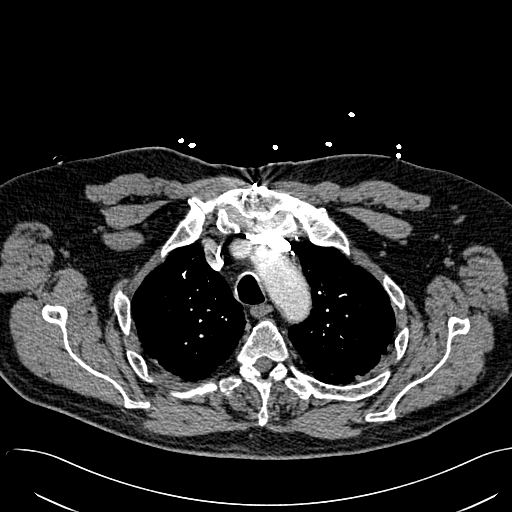
[im 246/296  lung]
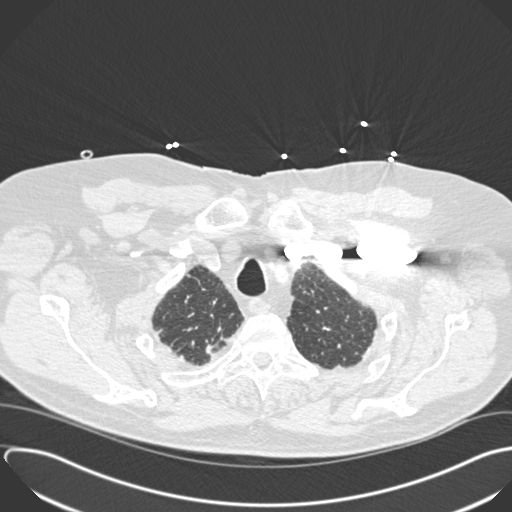
[im 263/296  mediastinal]
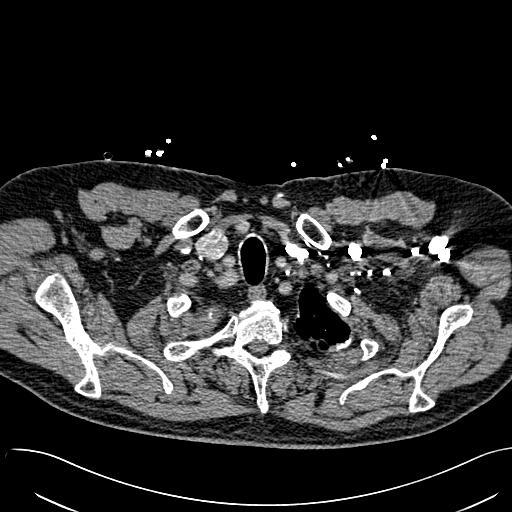
[im 279/296  lung]
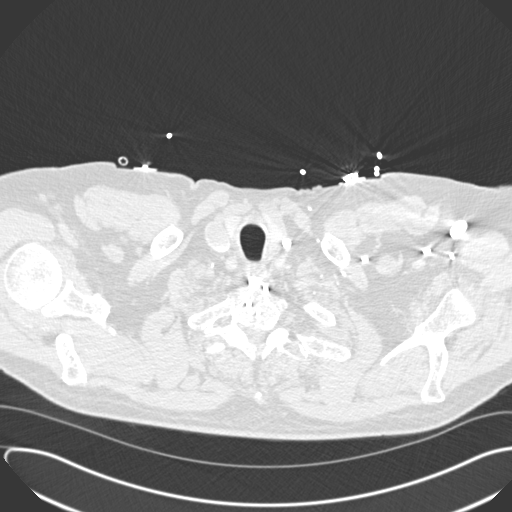

[Series 7: cor pe 2.0 mpr · coronal · 0.68mm/px · 1 of 124 slices shown]
[im 62/124  mediastinal]
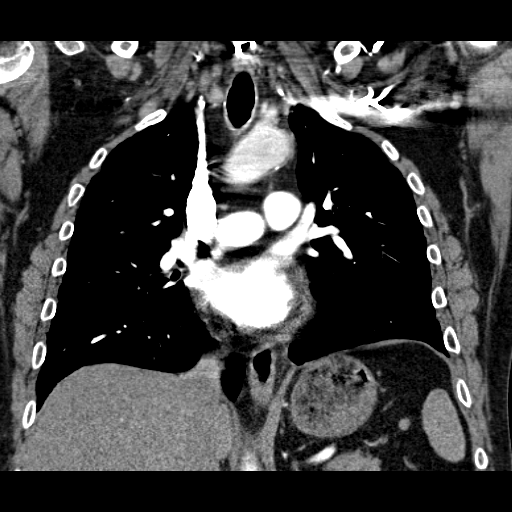

[18 of 36 positions shown; findings below may reference images not displayed]

FINDINGS: THORACIC INLET/BODY WALL:

No acute abnormality.

MEDIASTINUM:

Normal heart size. No pericardial effusion. Extensive coronary
atherosclerosis. Status post CABG. Cardiac motion limits assessment
of graft patency. No acute vascular abnormality, including pulmonary
embolism or aortic dissection. No adenopathy.

LUNG WINDOWS:

There are centrilobular nodules in all lobes of the right lung.
Diffuse bronchial wall thickening. No effusion, cavitation, or
pneumothorax.

UPPER ABDOMEN:

No acute findings.

OSSEOUS:

No acute fracture.  No suspicious lytic or blastic lesions.

Review of the MIP images confirms the above findings.
IMPRESSION: 1. Negative for pulmonary embolism.
2. Airspace disease in the right lung is most consistent with
pneumonia.

## 2013-10-23 ENCOUNTER — Inpatient Hospital Stay: Payer: Self-pay | Admitting: Family Medicine

## 2013-10-23 LAB — BASIC METABOLIC PANEL
Anion Gap: 7 (ref 7–16)
BUN: 18 mg/dL (ref 7–18)
CO2: 29 mmol/L (ref 21–32)
Calcium, Total: 9 mg/dL (ref 8.5–10.1)
Chloride: 96 mmol/L — ABNORMAL LOW (ref 98–107)
Creatinine: 1.15 mg/dL (ref 0.60–1.30)
EGFR (African American): >60
EGFR (Non-African Amer.): >60
Glucose: 141 mg/dL — ABNORMAL HIGH (ref 65–99)
OSMOLALITY: 269 (ref 275–301)
Potassium: 3.6 mmol/L (ref 3.5–5.1)
Sodium: 132 mmol/L — ABNORMAL LOW (ref 136–145)

## 2013-10-23 LAB — CBC WITH DIFFERENTIAL/PLATELET
BASOS ABS: 0 10*3/uL (ref 0.0–0.1)
BASOS PCT: 0.4 %
EOS ABS: 0.2 10*3/uL (ref 0.0–0.7)
EOS PCT: 1.6 %
HCT: 45.6 % (ref 40.0–52.0)
HGB: 14.9 g/dL (ref 13.0–18.0)
LYMPHS ABS: 5 10*3/uL — AB (ref 1.0–3.6)
LYMPHS PCT: 43.9 %
MCH: 30.3 pg (ref 26.0–34.0)
MCHC: 32.7 g/dL (ref 32.0–36.0)
MCV: 93 fL (ref 80–100)
Monocyte #: 0.9 x10 3/mm (ref 0.2–1.0)
Monocyte %: 8.1 %
NEUTROS ABS: 5.2 10*3/uL (ref 1.4–6.5)
NEUTROS PCT: 46 %
Platelet: 198 10*3/uL (ref 150–440)
RBC: 4.92 10*6/uL (ref 4.40–5.90)
RDW: 13.9 % (ref 11.5–14.5)
WBC: 11.3 10*3/uL — AB (ref 3.8–10.6)

## 2013-10-23 LAB — LIPID PANEL
CHOLESTEROL: 142 mg/dL (ref 0–200)
HDL Cholesterol: 37 mg/dL — ABNORMAL LOW (ref 40–60)
Ldl Cholesterol, Calc: 61 mg/dL (ref 0–100)
Triglycerides: 218 mg/dL — ABNORMAL HIGH (ref 0–200)
VLDL Cholesterol, Calc: 44 mg/dL — ABNORMAL HIGH (ref 5–40)

## 2013-10-23 LAB — PROTIME-INR
INR: 1
PROTHROMBIN TIME: 13.3 s (ref 11.5–14.7)

## 2013-10-23 LAB — APTT: ACTIVATED PTT: 78.5 s — AB (ref 23.6–35.9)

## 2013-10-23 LAB — MAGNESIUM: Magnesium: 1.5 mg/dL — ABNORMAL LOW

## 2013-10-24 LAB — CBC WITH DIFFERENTIAL/PLATELET
Basophil #: 0 10*3/uL (ref 0.0–0.1)
Basophil %: 0.2 %
EOS PCT: 2.1 %
Eosinophil #: 0.3 10*3/uL (ref 0.0–0.7)
HCT: 44.7 % (ref 40.0–52.0)
HGB: 14.9 g/dL (ref 13.0–18.0)
Lymphocyte #: 4.8 10*3/uL — ABNORMAL HIGH (ref 1.0–3.6)
Lymphocyte %: 39.8 %
MCH: 30.5 pg (ref 26.0–34.0)
MCHC: 33.2 g/dL (ref 32.0–36.0)
MCV: 92 fL (ref 80–100)
Monocyte #: 1 x10 3/mm (ref 0.2–1.0)
Monocyte %: 8 %
NEUTROS ABS: 6 10*3/uL (ref 1.4–6.5)
NEUTROS PCT: 49.9 %
Platelet: 212 10*3/uL (ref 150–440)
RBC: 4.86 10*6/uL (ref 4.40–5.90)
RDW: 13.8 % (ref 11.5–14.5)
WBC: 11.9 10*3/uL — ABNORMAL HIGH (ref 3.8–10.6)

## 2013-10-24 LAB — BASIC METABOLIC PANEL
Anion Gap: 8 (ref 7–16)
BUN: 19 mg/dL — ABNORMAL HIGH (ref 7–18)
Calcium, Total: 9.2 mg/dL (ref 8.5–10.1)
Chloride: 97 mmol/L — ABNORMAL LOW (ref 98–107)
Co2: 29 mmol/L (ref 21–32)
Creatinine: 1.33 mg/dL — ABNORMAL HIGH (ref 0.60–1.30)
EGFR (African American): >60
EGFR (Non-African Amer.): 55 — ABNORMAL LOW
Glucose: 148 mg/dL — ABNORMAL HIGH (ref 65–99)
Osmolality: 273 (ref 275–301)
POTASSIUM: 3.7 mmol/L (ref 3.5–5.1)
Sodium: 134 mmol/L — ABNORMAL LOW (ref 136–145)

## 2013-10-24 LAB — MAGNESIUM: MAGNESIUM: 1.7 mg/dL — AB

## 2013-10-24 LAB — APTT: Activated PTT: 61 secs — ABNORMAL HIGH (ref 23.6–35.9)

## 2013-10-25 LAB — BASIC METABOLIC PANEL
ANION GAP: 8 (ref 7–16)
BUN: 22 mg/dL — ABNORMAL HIGH (ref 7–18)
CO2: 26 mmol/L (ref 21–32)
CREATININE: 1.58 mg/dL — AB (ref 0.60–1.30)
Calcium, Total: 9.3 mg/dL (ref 8.5–10.1)
Chloride: 97 mmol/L — ABNORMAL LOW (ref 98–107)
GFR CALC AF AMER: 51 — AB
GFR CALC NON AF AMER: 44 — AB
Glucose: 131 mg/dL — ABNORMAL HIGH (ref 65–99)
Osmolality: 268 (ref 275–301)
Potassium: 3.9 mmol/L (ref 3.5–5.1)
Sodium: 131 mmol/L — ABNORMAL LOW (ref 136–145)

## 2013-10-25 LAB — MAGNESIUM: MAGNESIUM: 1.8 mg/dL

## 2013-10-27 LAB — CULTURE, BLOOD (SINGLE)

## 2014-10-03 NOTE — Consult Note (Signed)
PATIENT NAME:  Bryan Scott, Bryan Scott MR#:  [id number] DATE OF BIRTH:  1946-02-12  DATE OF CONSULTATION:  05/02/2012  REFERRING PHYSICIAN:   CONSULTING PHYSICIAN:  Maebelle Munroe, MD  CHIEF COMPLAINT: Left shoulder pain.  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   His pain control was inadequate in the ER and given his living status and inadequate pain control he was admitted for the hospitalist service for pain control.   REVIEW OF SYSTEMS: NEUROLOGICAL: No loss of consciousness. He does complain of numbness and tingling in the left upper extremity which is improved today in the "entire left arm". CARDIAC: No chest pain. History of coronary artery bypass. No current chest complaints. RESPIRATORY: No shortness of breath. SKIN: No laceration. GASTROINTESTINAL: No nausea, vomiting or diarrhea. GENITOURINARY: No dysuria. PSYCHIATRIC: No anxiety or depression. All other review of systems are negative.   PAST MEDICAL HISTORY: 1. Coronary artery disease status post MI.  2. Hypertension. 3. Chronic pain syndrome. 4. Hypercholesterolemia. 5. Gastroesophageal reflux disease. 6. Anemia.   PAST SURGICAL HISTORY:  1. Cervical spine surgery.  2. Coronary artery bypass surgery approximately seven years ago.   CURRENT MEDICATIONS: 1. Simvastatin. 2. Omeprazole.  3. Lisinopril. 4. Morphine. 5. Iron supplementation. 6. Aspirin.  7. Also a blood pressure medication.   ALLERGIES: Heparin.   SOCIAL HISTORY: Retired English as a second language teacher. Lives alone. Ex-smoker. He has a history of alcoholism 10 years ago.   FAMILY HISTORY: Unremarkable for diabetes or coronary artery disease.   PHYSICAL EXAMINATION: GENERAL: Pleasant, alert elderly male appearing stated age, cooperative with  examination.   VITAL SIGNS: Vital signs on presentation to 1A: Temperature 98.3, pulse 76, respirations 20, blood pressure 102/66, 97% oxygen saturation on 3 liters of oxygen.  PSYCHIATRIC: Mood and affect appropriate.   LUNGS: Clear to auscultation bilaterally.  HEART: Regular rate and rhythm. 2/6 systolic murmur.  ABDOMEN: Soft, positive bowel sounds.   SKIN: Warm and intact with mild ecchymosis just distal to the left shoulder.   LYMPHATIC: Moderate swelling in the left shoulder region.   HEENT: Normocephalic, atraumatic. Sclera clear. Oral mucosal membranes are slightly dry. There is a small cut on the bridge of his nose approximately 5 mm transverse.  NEUROLOGIC: Examination shows motor examination is intact in right upper extremity including axillary nerve function. There is decreased light touch sensation in the axillary nerve distribution as well as in a nondermatomal distribution in the left upper extremity. There is no deformity or tenderness to palpation of contralateral upper extremity or of the left or right lower extremity.  VASCULAR: 2+ radial and ulnar pulse left upper extremity   LABORATORY, DIAGNOSTIC AND RADIOLOGICAL DATA: Laboratory evaluations shows troponin is less than 0.02, magnesium is low at 1.2, creatinine is elevated at 1.45, sodium slightly low at 135.   Hemoglobin and hematocrit 9.7 and 27.4 with 144,000 platelets.   Radiographs of the left shoulder reveal a comminuted four part proximal humerus fracture with extension into metaphyseal, diaphyseal region with butterfly fragment medially. CT scan is consistent with plain radiographs showing a minimally displaced lesser tuberosity fracture and a mildly comminuted greater tuberosity fracture with metaphyseal, diaphyseal extension and medial butterfly fragmentation. The humeral head is located and there does not appear to be articular involvement.   IMPRESSION:  1. Left proximal humerus four part  fracture.   2. Multiple medical problems including chronic pain syndrome, anemia and coronary artery disease.   PLAN: TEDs and sequentials bilateral lower extremities. Pain control per the hospitalist service. I have discussed this patient with Dr. Rudene Christians who has agreed to assume his management tomorrow which most likely include open reduction internal fixation with a proximal humeral locking plate, small possibility of hemiarthroplasty. The patient was informed that he will require surgery. He will be n.p.o. after midnight.   ____________________________ Maebelle Munroe, MD jfs:cms D: 05/02/2012 13:40:52 ET T: 05/02/2012 14:11:39 ET JOB#: [id number]  cc: Maebelle Munroe, MD, <Dictator>  Maebelle Munroe MD ELECTRONICALLY SIGNED 05/02/2012 21:56

## 2014-10-03 NOTE — Op Note (Signed)
PATIENT NAME:  Bryan Scott, Bryan Scott MR#:  [id number] DATE OF BIRTH:  1946/03/18  DATE OF PROCEDURE:  05/03/2012  PREOPERATIVE DIAGNOSES:  1. Left surgical neck and head fracture.  2. Left humeral shaft fracture.   POSTOPERATIVE DIAGNOSES: 1. Left surgical neck and head fracture.  2. Left humeral shaft fracture.   PROCEDURE: Open reduction internal fixation left humeral head and neck and shaft fractures.   ANESTHESIA: General.   SURGEON: Laurene Footman, MD   DESCRIPTION OF PROCEDURE: The patient was brought to the operating room and after adequate anesthesia was obtained the patient was placed onto the shoulder table with the spider attachment applied. After prepping and draping in the usual sterile manner, C-arm was brought in and good visualization could be obtained. At this point appropriate patient identification and time-out procedures were completed. An deltopectoral approach was made proximally. It extended anterolaterally from the shaft fracture. After exposure of the proximal fracture through the deltopectoral split and elevating the deltoid with the arm in slight abduction,  Somminution was noted. There were multiple small fragments of the soft tissue and a little bit was left in place intact. With the C-arm as a guide, a six-hole S3 locking  proximal humeral plate was chosen at the appropriate length and the proximal fragment was fixed first with the plate applied to the lateral surface of the proximal  humerus. A K wire was used to hold it in position. When appropriate position was obtained in both AP and lateral projections, the smooth pegs were placed first drilling through fast guides using the hand drill and then placing the sleeve locking pegs. When all of these had been placed, the cannula was removed and there appeared to be rigid fixation of the proximal fragment. Next, the arm was manipulated with a spider attachment to try to get adequate alignment of the shaft fracture with the  plate was applied to the lateral aspect of the proximal humerus. The plate was fixed to the distal fragment. There was a large comminuted piece in a butterfly fragment medially which could be captured through the plate. The two main fragments were relatively adequately aligned on lateral view. On AP view there was some valgus to the proximal fragment and was felt this was acceptable alignment. The screw holes were filled using standard technique, drilling, measuring, and placing the cortical screw.  cortical  screws were placed in the appropriate aspect of the proximal fragment as well. With fluoroscopy the fracture appeared to be rigidly fixed with a slight gap at the oblique portion of the fracture site and this was filled with CopiOs 5 mL bone void filler strip in the gap of the fractture. The wound was then closed using #1 Vicryl for the deep fascia, 2-0 Vicryl subcutaneously, and skin staples. DuoDERM, 4 x 4's, ABD, and tape applied along with a sling. The patient was sent to the recovery room in stable condition.   ESTIMATED BLOOD LOSS: 250 mL.   COMPLICATIONS: None.  SPECIMEN: None.   IMPLANT: Biomet S3 locking proximal humerus plate, six-hole.   ____________________________ Laurene Footman, MD mjm:drc D: 05/03/2012 22:02:37 ET T: 05/04/2012 09:04:43 ET JOB#: [id number]  cc: Laurene Footman, MD, <Dictator> Laurene Footman MD ELECTRONICALLY SIGNED 05/04/2012 10:28

## 2014-10-03 NOTE — Discharge Summary (Signed)
PATIENT NAME:  Bryan Scott, Bryan Scott MR#:  [id number] DATE OF BIRTH:  February 18, 1946  DATE OF ADMISSION:  05/02/2012 DATE OF DISCHARGE:  05/05/2012  ADMITTING DIAGNOSIS: Left proximal humerus fracture.   DISCHARGE DIAGNOSIS: Left proximal humerus fracture.   OPERATION: On 05/03/2012, he had open reduction and internal fixation of left proximal humerus neck and shaft fracture.  SURGEON: Hessie Knows, MD  ANESTHESIA: General.  ESTIMATED BLOOD LOSS: 250 mL.  DRAINS: None.  COMPLICATIONS: None.  IMPLANTS: Biomet S3 plate, screws and smooth pegs.   The patient was stabilized and brought to the recovery room and then brought to the orthopedic floor where he was treated with physical therapy and for pain control.   HISTORY: Mr. Hensch is a 69 year old white male who tripped over something and fell hurting his left shoulder, on 05/02/2012. He presented to [facility] the same day where x-rays confirmed a left proximal humerus fracture. He was also found to be hypokalemic with potassium of 2.6.   PHYSICAL EXAMINATION: An elderly male lying in bed, appears to be in pain restricting movement. HEENT: No pallor. No icterus. No cyanosis. No discharge of the ear. No bleeding and no lesions. NOSE: Nasal mucosa normal without ulcers. No lesions. No bleeding.  OROPHARYNX: Oral mucosa is normal without any exudates. No ulcers. EYES: Normal eyelids and conjunctiva. Pupils are round, equal, and constricted. NECK: Supple, trachea midline. No thyromegaly. No cervical lymphadenopathy. No masses. HEART: Normal S1 and S2. No murmur. No gallop. No carotid bruits. RESPIRATORY: Normal breathing pattern without use of accessory muscles. No rales. No wheezing. ABDOMEN: Soft without tenderness. No hepatomegaly. No masses. No hernia. SKIN: No ulcers. No subcutaneous nodules. MUSCULOSKELETAL: Restricted movement of the left shoulder, tenderness of the left upper arm and shoulder. NEUROLOGIC: Sensation is intact. PSYCH: Alert and oriented, mood  and affect were flat.   HOSPITAL COURSE: After admission on 05/02/2012, he had ORIF of his left proximal humerus, on 05/03/2012. He had good pain control immediately after surgery and was brought to the orthopedic floor from the PAC-U. He has since struggled with satisfaction with his pain control. He is on 30 mg of MS Contin at home. He has been getting IV Dilaudid in the hospital. Repeat hemoglobin from 05/04/2012 is 10.6. On 05/05/2012, he was stable and ready to go to rehab.   CONDITION AT DISCHARGE: Stable.   DISPOSITION: The patient is sent to rehab.   DISCHARGE INSTRUCTIONS: The patient will follow up with [facility] in two weeks for staple removal. He may do gentle range of motion of the left upper extremity and should be nonweightbearing on the left upper extremity. He should continue his Polar Care again and use his sling. He may change his dressing daily and as needed.   DISCHARGE MEDICATIONS: 1. Lisinopril 20 mg once daily.  2. Aspirin 81 mg enteric coated once daily.  3. Simvastatin 80 mg one tab p.o. at bedtime.  4. Morphine sulfate 30 mg one tab p.o. three times daily. 5. Omeprazole 20 mg enteric coated two pills twice daily.  6. Centrum Silver Men's oral tablet 1 tablet daily.          7. Oxycodone 5 mg two tablets every 4 hours p.r.n. pain.  8. Tylenol 650 to 1,000 mg every six hours as needed for pain. ____________________________ Vicy Medico M. Tretha Sciara, NP amb:slb D: 05/05/2012 13:08:50 ET T: 05/05/2012 13:21:02 ET JOB#: [id number]  cc: Coree Brame M. Tretha Sciara, NP, <Dictator> Kem Kays Khamiyah Grefe FNP ELECTRONICALLY SIGNED 05/11/2012 15:43

## 2014-10-03 NOTE — Consult Note (Signed)
Brief Consult Note: Diagnosis: complex 4 part Left proximal humerus fracture.   Patient was seen by consultant.   Consult note dictated.   Recommend to proceed with surgery or procedure.   Comments: d/w dr Rudene Christians, who will proceed with surgery tomorrow, orif proximal humerus fx full note dictated.  Electronic Signatures: Maebelle Munroe (MD)  (Signed [phone] 13:41)  Authored: Brief Consult Note   Last Updated: 17-Nov-13 13:41 by Maebelle Munroe (MD)

## 2014-10-03 NOTE — H&P (Signed)
PATIENT NAME:  Bryan Scott, Bryan Scott MR#:  [id number] DATE OF BIRTH:  [birth year]  DATE OF ADMISSION:  05/02/2012  PRIMARY CARE PHYSICIAN: Dr. [name]. He also follows at the [facility]: Left shoulder pain status post fall.   HISTORY OF PRESENT ILLNESS: Bryan Scott is a 69 year old Caucasian male who was in his usual state of health until last evening around 8:00 in the evening he tripped over something, fell and he hurt his left shoulder. Patient came to the Emergency Department for evaluation. He was found to have left proximal humerus comminuted fracture. The pain was severe, reporting until now despite pain medication his pain is 10 on a scale of 10. He was also found to have severe hypokalemia with potassium of 2.6. The patient was admitted to the hospital for observation for pain control and to correct his hypokalemia.    REVIEW OF SYSTEMS: CONSTITUTIONAL: Denies having any fever. No chills. No fatigue. EYES: No blurring of vision. No double vision. ENT: No hearing impairment. No sore throat. No dysphagia. CARDIOVASCULAR: No chest pain. No shortness of breath. No edema. No syncope. RESPIRATORY: No cough. No sputum production. No shortness of breath. No chest pain. GASTROINTESTINAL: No abdominal pain. No vomiting. No diarrhea. GENITOURINARY: No dysuria. No frequency of urination. MUSCULOSKELETAL: Apart from the left shoulder and upper left arm pain he has no other joint pain or swelling. No muscular pain or swelling. INTEGUMENTARY: No skin rash. No ulcers. NEUROLOGY: No focal weakness. No seizure activity. No headache. PSYCHIATRY: No anxiety. No depression. ENDOCRINE: No polyuria or polydipsia. No heat or cold intolerance. HEMATOLOGY: No easy bruisability.   PAST MEDICAL HISTORY:  1. History of coronary artery disease, status post myocardial infarction.  2. History of coronary artery bypass graft seven years ago.  3. Systemic hypertension.  4. Chronic pain syndrome.   5. Hypercholesterolemia. 6. Gastroesophageal reflux disease. 7. Anemia.   FAMILY HISTORY: His mother died at age of 63 from natural causes. His father died at age of 67 from complications of multiple seizures.   SOCIAL HISTORY: He is single, lives at home alone, retired English as a second language teacher.  SOCIAL HABITS: Ex- chronic smoker. He quit smoking seven years ago. Patient does not abuse any drugs other than prescription medications. No alcoholism currently. He quit alcoholism 10 years ago.   PAST SURGICAL HISTORY: Coronary artery bypass graft seven years ago.   ADMISSION MEDICATIONS: Patient does not recall the name of his medications and he keeps stating that we have them in the list or to check with the [facility]. The list we have from before is:  1. Simvastatin 80 mg. 2. Omeprazole 20 mg twice a day. 3. Lisinopril 20 mg a day. 4. Morphine sulfate 30 mg a day. He confirmed this medication.  5. Iron supplementation. 6. Aspirin 81 mg a day.  7. He is also on a new blood pressure medication that he does not recall the name.   ALLERGIES: From his records is heparin but unknown type of reaction.   PHYSICAL EXAMINATION:  VITAL SIGNS: Blood pressure 148/76, respiratory rate 20, pulse 73, temperature 98.6, pulse oximetry 100.   GENERAL APPEARANCE: Elderly male laying in bed. He appears to be in pain, restricting any movement.   HEAD AND NECK: No pallor. No icterus. No cyanosis.   EAR: No discharge. No bleeding. No lesions.   NOSE: Nasal mucosa was normal without ulcers. No lesions. No bleeding.   OROPHARYNX: The oral mucosa and throat was normal without any exudates.  No ulcers.   EYES: Normal eyelids and conjunctivae. Pupils are round, equal and they are constricted.   NECK: Supple. Trachea at midline. No thyromegaly. No cervical lymphadenopathy. No masses.   HEART: Normal S1, S2. No S3, S4. No murmur. No gallop. No carotid bruits.   RESPIRATORY: Normal breathing pattern without use of  accessory muscles. No rales. No wheezing.   ABDOMEN: Soft without tenderness. No hepatosplenomegaly. No masses. No hernias.   SKIN: No ulcers. No subcutaneous nodules.   MUSCULOSKELETAL: No joint swelling. No clubbing. He is restricting any movement on the left arm and left shoulder with tenderness on the left upper arm area and shoulder.   NEUROLOGIC: Cranial nerves II through XII are intact. No focal motor deficit. Sensation is intact.   PSYCHIATRY: Patient is alert, oriented x3. Mood and affect were flat.   LABORATORY, DIAGNOSTIC, AND RADIOLOGICAL DATA: EKG showed normal sinus rhythm at rate of 74 per minute. Inferior myocardial infarction. This is an old finding compared to his October 2011 EKG. He also nonspecific ST-T wave abnormalities. X-ray of the left shoulder showed comminuted extra-articular fracture of the proximal left humerus. He had CAT scan of the head which showed no acute intracranial findings and no hemorrhage. CAT scan of the cervical spine showed no fracture or dislocation. There is evidence of multiple degenerative disk disease and facet osteoarthritis and foraminal narrowing. Serum glucose 115, BUN 15, creatinine 0.9, sodium 145, potassium 2.6, chloride 120, magnesium 1.2, calcium 5.5 with albumin 2.3. Total protein 4.4. AST 17, ALT 20. Troponin less than 0.02. CBC showed white count 10,000, hemoglobin 9.7, hematocrit 27, platelet count 144.   ASSESSMENT:  1. Left proximal humerus comminuted fracture status post fall. Pain is uncontrolled. 2. Severe hypokalemia.  3. Hypomagnesemia. 4. Hypocalcemia.  5. Systemic hypertension.  6. History of coronary artery disease   PLAN: Will admit the patient for observation, pain control. Orthopedic consultation. Right now the left arm is immobilized. Continue home medications as listed above. Potassium supplementation and magnesium as well. His calculated calcium according to his albumin will be 6.8 which is still low therefore I will  give him calcium supplementation. I will consult the social services to take the next step since the patient is at home alone and he may need help and maybe short-term placement or if he can manage to have a sitter with him.    TIME SPENT EVALUATING THIS PATIENT: Took more than 50 minutes.   ____________________________ Clovis Pu. Lenore Manner, MD amd:cms D: 05/02/2012 01:58:14 ET T: 05/02/2012 10:59:55 ET JOB#: [id number]  cc: Clovis Pu. Lenore Manner, MD, <Dictator> Youlanda Roys. Lovie Macadamia, MD [place] MD ELECTRONICALLY SIGNED 05/03/2012 1:09

## 2014-10-06 NOTE — Consult Note (Signed)
General Aspect lack of IV access    Present Illness This is a 69 year old male who presents to the Emergency Room complaining of chest pain that woke him up from sleep this morning. The patient describes the chest pain as being sharp in nature, located in the center of his chest, radiating to his back. The chest pain is also associated with shortness of breath, some diaphoresis and nausea, but no vomiting.  He was just recently hospitalized about 2 weeks or so ago for angioedema secondary to lisinopril.  In the Emergency Room, the patient was noted to be in acute new onset rapid atrial fibrillation. He has been transferred to the ICU.  There is concern for PE but multiple attempts at starting an adequate IV have failed.  I am asked to evaluate.   PAST MEDICAL HISTORY: 1. Hypertension. 2. GERD. 3. Chronic pain syndrome. 4. Hyperlipidemia. 5. History of coronary artery disease status post CABG.  6. History of tobacco abuse.   Home Medications: Medication Instructions Status  hydrALAZINE 25 mg oral tablet 1 tab(s) orally every 8 hours Active  morphine 30 mg oral tablet 1 tab(s) orally 3 times a day x 5 days, As Needed Active  omeprazole 20 mg oral delayed release capsule 2 cap(s) orally 2 times a day Active  aspirin 81 mg oral tablet 1 tab(s) orally once a day Active  Mag-Ox 400 1 tab(s) orally once a day Active  atorvastatin 80 mg oral tablet 0.5 tab(s) orally once a day (at bedtime) Active  ranitidine 150 mg oral capsule 1 cap(s) orally 2 times a day Active  docusate-senna 50 mg-8.6 mg oral tablet 1 tab(s) orally 2 times a day Active  amlodipine 10 mg oral tablet 1 tab(s) orally once a day Active  ondansetron 8 mg oral tablet 0.5 tab(s) orally every 8 hours, As Needed- for Nausea Active    Lisinopril: Angioedema  Heparin: Unknown  Case History:   Family History Non-Contributory    Social History negative tobacco, negative ETOH, negative Illicit drugs   Review of Systems:    Fever/Chills No    Cough No    Sputum No    Abdominal Pain No    Diarrhea No    Constipation No    Nausea/Vomiting No    SOB/DOE Yes    Chest Pain Yes    Telemetry Reviewed Afib    Dysuria No   Physical Exam:   GEN well developed, critically ill appearing    HEENT pale conjunctivae, PERRL, hearing intact to voice    NECK supple  trachea midline    RESP normal resp effort  no use of accessory muscles    CARD irregular rate  no JVD    ABD denies tenderness  denies Flank Tenderness  soft    EXTR negative cyanosis/clubbing, positive edema    SKIN No ulcers, skin turgor poor    NEURO cranial nerves intact, follows commands, motor/sensory function intact    PSYCH alert, A+O to time, place, person   Nursing/Ancillary Notes: **Vital Signs.:   24-Jan-14 14:00   Temperature Temperature (F) 98.2   Celsius 36.7   Temperature Source axillary   Pulse Pulse 86   Respirations Respirations 29   Systolic BP Systolic BP 563   Diastolic BP (mmHg) Diastolic BP (mmHg) 80   Mean BP 96   Pulse Ox % Pulse Ox % 95   Pulse Ox Activity Level  At rest   Oxygen Delivery 3L; Nasal Cannula   Pulse  Ox Heart Rate 84   Routine Chem:  24-Jan-14 05:33    Magnesium, Serum  1.7 (1.8-2.4 THERAPEUTIC RANGE: 4-7 mg/dL TOXIC: > 10 mg/dL  -----------------------)   Glucose, Serum  138   BUN 16   Creatinine (comp) 1.09   Sodium, Serum  133   Potassium, Serum 3.5   Chloride, Serum 103   CO2, Serum 21   Calcium (Total), Serum  8.0   Anion Gap 9   Osmolality (calc) 270   eGFR (African American) >60   eGFR (Non-African American) >60 (eGFR values <27m/min/1.73 m2 may be an indication of chronic kidney disease (CKD). Calculated eGFR is useful in patients with stable renal function. The eGFR calculation will not be reliable in acutely ill patients when serum creatinine is changing rapidly. It is not useful in  patients on dialysis. The eGFR calculation may not be applicable to  patients at the low and high extremes of body sizes, pregnant women, and vegetarians.)  Urine Drugs:  [id number]   Tricyclic Antidepressant, Ur Qual (comp) NEGATIVE (Result(s) reported on 09 Jul 2012 at 10:29AM.)   Amphetamines, Urine Qual. NEGATIVE   MDMA, Urine Qual. NEGATIVE   Cocaine Metabolite, Urine Qual. NEGATIVE   Opiate, Urine qual POSITIVE   Phencyclidine, Urine Qual. NEGATIVE   Cannabinoid, Urine Qual. NEGATIVE   Barbiturates, Urine Qual. NEGATIVE   Benzodiazepine, Urine Qual. NEGATIVE (----------------- The URINE DRUG SCREEN provides only a preliminary, unconfirmed analytical test result and should not be used for non-medical  purposes.  Clinical consideration and professional judgment should be  applied to any positive drug screen result due to possible interfering substances.  A more specific alternate chemical method must be used in order to obtain a confirmed analytical result.  Gas chromatography/mass spectrometry (GC/MS) is the preferred confirmatory method.)   Methadone, Urine Qual. NEGATIVE  Cardiac:  24-Jan-14 23:50    CK, Total 141   CPK-MB, Serum 0.8 (Result(s) reported on 09 Jul 2012 at 12:24AM.)  Routine Hem:  24-Jan-14 05:33    WBC (CBC) 4.7   RBC (CBC)  4.23   Hemoglobin (CBC) 13.9   Hematocrit (CBC)  39.1   Platelet Count (CBC)  118   MCV 93   MCH 32.9   MCHC 35.6   RDW 12.9   Neutrophil % 62.5   Lymphocyte % 26.1   Monocyte % 10.7   Eosinophil % 0.1   Basophil % 0.6   Neutrophil # 2.9   Lymphocyte # 1.2   Monocyte # 0.5   Eosinophil # 0.0   Basophil # 0.0 (Result(s) reported on 09 Jul 2012 at [facility])     Impression 1.  Lack of appropriate IV access          will place a central line from the left IJ to allow for proper positioning of the tip of the catheter for obtaining a CT angiogram. 2. Chest pain. The patient does have significant risk factors given history of tobacco abuse and previous history of coronary artery bypass  graft. I will keep him on telemetry, follow serial cardiac markers; the first set is negative. Continue aspirin, beta blocker, statin, morphine, nitroglycerin and oxygen supplementation. We will get a cardiology consult and discuss with Dr. AFletcher Anon We will also get a two-dimensional echocardiogram.  3. Chronic pain syndrome. Continue with p.r.n. morphine for pain.  4. Gastroesophageal reflux disease. Continue Prilosec.  5. Hyperlipidemia. Continue atorvastatin.  6. Acute renal failure. This is possibly related to dehydration. I will gently hydrate  with IV fluids and follow his BUN and creatinine, renal dose medications and avoid nephrotoxins.    Plan level 3   Electronic Signatures: Hortencia Pilar (MD)  (Signed 24-Jan-14 16:48)  Authored: General Aspect/Present Illness, Home Medications, Allergies, History and Physical Exam, Vital Signs, Labs, Impression/Plan   Last Updated: 24-Jan-14 16:48 by Hortencia Pilar (MD)

## 2014-10-06 NOTE — Discharge Summary (Signed)
PATIENT NAME:  Bryan Scott, Bryan Scott MR#:  [id number] DATE OF BIRTH:  03-Feb-1946  DATE OF ADMISSION:  07/08/2012  DATE OF DISCHARGE:  07/12/2012  DISCHARGE DISPOSITION:  [place].  DISCHARGE DIAGNOSES: 1.  Chest pain secondary to new onset atrial fibrillation, and also community-acquired pneumonia.  2.  New onset atrial fib. 3.  Hypertension.  4.  Acute renal failure, resolved.  5.  Elevated d-dimer secondary to pneumonia.  6.  History of coronary artery disease status post coronary artery bypass graft.  7.  Gastroesophageal reflux disease.  8.  Hyperlipidemia.  9.  Chronic pain syndrome.  10.  Deconditioning.   DISCHARGE MEDICATIONS:   1.  Omeprazole 20 mg p.o. 2 capsules, that is 40 mg p.o. b.i.d. 2. Aspirin 81 mg daily.  3.  Magnesium oxide 400 mg p.o. daily.  4.  Ranitidine 150 mg p.o. b.i.d.  5.  Docusate/senna 80/8.6 mg p.o. b.i.d. 6.  Amlodipine is discontinued.   7.  Zofran 4 mg as needed q.8 hours.  8.  Atorvastatin 40 mg p.o. daily at bedtime.  9.  Percocet 5/325 q.6 hours p.r.n. for pain.  10.  Nitroglycerin 0.4 mg sublingual as needed for chest pain.  New medicines: 1.  Xarelto 20 mg p.o. daily A. fib. 2.  For atrial fibrillation, metoprolol 100 mg p.o. q.12 hours.  3.  G 4.  DuoNeb q.6 hours as needed for wheezing.  5.  Levaquin 500 mg p.o. daily for 3 days.  6.  Prednisone dose tapping 40 mg daily for 2 days, 20 mg daily for 2 days, and then stop.  Advised to stop the following medications:  1.  Morphine 30 mg t.i.d.  2.  Hydralazine 25 mg.  3.  Norvasc is stopped.   DIET: Low sodium, low fat diet.   CONSULTATIONS:  Cardiology consult with Dr. Fletcher Anon.   HOSPITAL COURSE: The patient is a 69 year old male patient with history of hypertension, coronary artery disease, status post CABG, hyperlipidemia, chronic pain syndrome, who came in because of chest pain, also some lightheadedness.  The patient previous hospitalization is significant for angioedema with  lisinopril. The patient was brought in by the EMS. The patient noted to have rapid A. fib with RVR when he came in. Look at the history and physical for full details. The patient labs significant for elevated d-dimer of 1.67 with negative troponins, and also chest x-ray showed some left lower lobe atelectasis. Sodium also was low at 127, creatinine was high at 1.5, so he was admitted for new onset atrial fibrillation and chest pain and acute renal failure with hyponatremia. The patient fount to have new onset atrial fibrillation. The patient's heart rate was about 111 on admission. He is admitted to ICU, seen by Dr. Fletcher Anon in consultation. HE HAS AN ALLERGY TO HEPARIN,  so he was started on bivalirudin drip. The patient was also started on small dose of Cardizem drip. Because he has elevated d-dimer of 1.67, he had a V/Q scan, which showed moderate to high probability for PE, and also we continued the bivalirudin drip. The patient was hydrated for renal failure, and the patient's creatinine improved. Initially, his BUN was 20, creatinine 1.50, sodium 127, which was improved after the hydration. The patient had a CT of the chest, which did not show any pulmonary emboli, so his bivalirudin drip was discontinued. He also does not have any DVT in the legs. For his atrial fibrillation, he had an echocardiogram done, which showed EF of 50% to  55% with LVH and mild inferior wall hypokinesia. The patient was started on beta blockers, and right now he is on 100 mg of metoprolol q.12 hours, and he is also start on Xarelto for anticoagulation, and after discussing the risks and benefits of anticoagulation, he was started on Xarelto.  This morning, heart rate was 140s, so metoprolol was increased to 100 mg b.i.d. by Dr. Fletcher Anon, and repeat heart rate is 101. The patient is going to need to follow up with Dr. Fletcher Anon regarding his chest pain and coronary artery disease with new onset A. fib.  He needs a stress test. The patient's  troponins have been negative throughout  the hospitalization, so he will be following with Dr. Fletcher Anon.   The patient has pneumonia on the chest x-ray with cough, so he was started on Levaquin and nebulizers, along with guaifenesin. The patient persistently had a cough, so we started him on IV Solu-Medrol. This morning, he feels much better. His cough is less, so we are going to discharge him with  tapering course of prednisone, and he can finish the Levaquin for 3 more days.    Acute renal failure, resolved. The patient received IV fluids and his renal failure resolved, and his p.o. intake is better.   Chronic pain syndrome, continued to request Norco, and gave a limited supply of pain medicine.   History of coronary artery disease and CABG. He is on aspirin and statins, which can be continued. HE IS ALLERGIC TO LISINOPRIL AND HAD ANGIOEDEMA.   The patient was seen by physical therapy because he was unstable when he walked, and physical therapy recommended rehab. The patient chose [facility], so he will be going to [facility] today.  Follow up with Dr. Fletcher Anon in 1 to 2 weeks regarding stress test, and also regarding atrial fibrillation.   TIME SPENT ON DISCHARGE PREPARATION: More than 30 minutes.   CODE STATUS: FULL CODE.    ____________________________ Epifanio Lesches, MD sk:dm D: 07/12/2012 11:33:00 ET T: 07/12/2012 12:01:33 ET JOB#: [id number]  cc: Epifanio Lesches, MD, <Dictator> Epifanio Lesches MD ELECTRONICALLY SIGNED 07/28/2012 22:16

## 2014-10-06 NOTE — Consult Note (Signed)
PATIENT NAME:  Bryan Scott, Bryan Scott MR#:  [id number] DATE OF BIRTH:  09-Jan-1946  DATE OF CONSULTATION:  02/15/2013  REFERRING PHYSICIAN:   CONSULTING PHYSICIAN:  Ayden Hardwick D. Dajai Wahlert, MD  INDICATION: Unstable angina, possible acute myocardial infarction and coronary artery disease.   He usually sees a primary physician in the [facility] in [place]: The patient is a 69 year old white male with history of cardiac catheterization in 2006, multivessel disease, transferred to [facility]. He had coronary artery bypass x 4. He has done reasonably well with hypertension, hyperlipidemia and myocardial infarction in the past. He had a repeat catheterization about a year ago, which showed occlusion of  vein grafts to the circumflex and ramus. He had had stents to the left main reportedly done at [facility] and was followed at the [place]. He had done reasonably well until about an hour prior to presentation when he started having significant severe chest discomfort. EKG was abnormal suggestive of ST elevation inferiorly, but there was also Q waves in that area. The EKG was sufficiently concerning with the chest pain a 10 out of 10, a STEMI alert was called and cardiology was then recommended for evaluation because of persistent pain and abnormal EKG. It was not certain he was having a STEMI, but it was concerning enough that he was recommended and taken to cardiac cath lab for further evaluation potentially for evaluation of the vein graft or occlusion.   REVIEW OF SYSTEMS: No blackout spells or syncope. Denies nausea or vomiting. No fever. No chills. No sweats. No weight loss. No weight gain. No hemoptysis or hematemesis. No bright red blood per rectum. He has had some GERD symptoms or some reflux and worsening chest pain symptoms. Pain was 8 or 9 out of 10.   PAST MEDICAL HISTORY: Coronary artery disease, myocardial infarction, hypercholesterolemia, hypertension and degenerative joint disease.   PAST  SURGICAL HISTORY: Cardiac bypass surgery, back surgery x 4, neck surgery, angioplasty and stenting and lithotripsy.   FAMILY HISTORY: Hypertension.   SOCIAL HISTORY: Disabled. No smoking. No alcohol consumption recently.   MEDICATIONS: Vitamin C once a day, Ultram 100 mg every 8 hours, tramadol p.r.n.,  simvastatin 80 mg a day, omeprazole 40 twice a day, morphine 30 mg 3 times a day, metoprolol 50 mg one half twice a day, lisinopril 20 a day, hydrochlorothiazide 12.5 mg a day, Centrum Silver once a day, azithromycin t.i.d. for 3 days and aspirin 81 mg a day.   ALLERGIES: HEPARIN.   PHYSICAL EXAMINATION:  VITAL SIGNS: Blood pressure 200/100, pulse of 77, respiratory rate 18 and afebrile.  HEENT: Normocephalic, atraumatic. Pupils reactive to light.  NECK: Soft and supple. No significant JVD or adenopathy.  LUNGS: Clear to auscultation with no significant wheeze, rhonchi, or rale.  HEART: Regular rate and rhythm. No significant murmur, gallops, or rubs.  ABDOMEN: Benign. EXTREMITIES: normal 2+ pulses SKIN: Normal.   LABORATORIES: MET-B unremarkable except for elevated creatinine. CBC unremarkable. PT and PTT unremarkable.   EKG: Normal sinus rhythm, ST elevation inferiorly with diffuse ST depression laterally with Q waves inferiorly as well as LVH criteria by voltage. Chest x-ray negative.   ASSESSMENT: Possible acute ST elevation myocardial infarction, unstable angina, coronary artery disease, chest pain, hypertension, hyperlipidemia, history of bladder cancer, degenerative joint disease and reflux.   PLAN: Agree with admit. We will transfer to the cardiac cath lab for emergent cardiac catheter for evaluation of possible STEMI. Hold off on heparin. Will treat with heparin  IV  if needed for anticoagulation. Continue aspirin therapy. Would continue reflux therapy in the meantime. Continue morphine support for pain control. Nitroglycerin as necessary as well. Continue blood pressure control with  beta blockers and ACE inhibitors. I would reinstitute statin therapy. We will follow up after cardiac catheterization.  ____________________________ Loran Senters Clayborn Bigness, MD ddc:aw D: 02/17/2013 13:47:48 ET T: 02/17/2013 14:08:29 ET JOB#: [id number]  cc: Bowe Sidor D. Clayborn Bigness, MD, <Dictator> Yolonda Kida MD ELECTRONICALLY SIGNED 02/23/2013 11:47

## 2014-10-06 NOTE — Consult Note (Signed)
PATIENT NAME:  Bryan Scott, Bryan Scott MR#:  [id number] DATE OF BIRTH:  12-Dec-1945  DATE OF CONSULTATION:  07/08/2012  REQUESTING PHYSICIAN:  Belia Heman. Verdell Carmine, MD CONSULTING PHYSICIAN:  Demecia Northway A. Fletcher Anon, MD  PRIMARY CARE PHYSICIAN:  [facility].   REASON FOR CONSULTATION:  Chest pain and atrial fibrillation.   HISTORY OF PRESENT ILLNESS:  This is a 69 year old male with known history of coronary artery disease status post CABG years ago, hypertension, chronic pain syndrome, gastroesophageal reflux disease and hyperlipidemia. He also reports possible history of previous atrial fibrillation. He was recently hospitalized for angioedema. He presented to the hospital with sharp substernal chest discomfort with increased dyspnea as well as diaphoresis and nausea. The chest pain is worse with a deep breath. He reports significant dyspnea as well as cough productive of clear sputum. He was noted to be in atrial fibrillation with rapid ventricular response. Heart rate was ranging from 100 to 120 beats per minute. D-dimer was elevated. V/Q scan was moderate to high probability for pulmonary embolism. Lower extremity venous Doppler was negative for deep vein thrombosis.   PAST MEDICAL HISTORY:  1.  Coronary artery disease status post CABG.  2.  Hypertension.  3.  Gastroesophageal reflux disease.  4.  Chronic pain syndrome.  5.  Hyperlipidemia.  6.  Previous history of tobacco use.   ALLERGIES:  HEPARIN AND LISINOPRIL. LISINOPRIL CAUSED ANGIOEDEMA. HEPARIN ACCORDING TO HIM CAUSED HIM TO BLEED FROM EVERYWHERE.   HOME MEDICATIONS:   1.  Amlodipine 10 mg daily.  2.  Aspirin 81 mg daily.  3.  Atorvastatin 40 mg at bedtime.  4.  Senokot.  5.  Hydralazine 25 mg every 8 hours.  6.  Magnesium oxide 400 mg daily.  7.  Morphine 30 mg 3 times daily as needed.  8.  Omeprazole 20 mg twice daily.  9.  Zofran as needed.  10.  Ranitidine.   SOCIAL HISTORY:  He quit smoking 8 years ago. He denies alcohol or  recreational drug use.   FAMILY HISTORY:  Negative for premature coronary artery disease.   REVIEW OF SYSTEMS:  A 10-point review of systems was performed. It is negative other than what is mentioned in the HPI.   PHYSICAL EXAMINATION: GENERAL: The patient appears to be older than his stated age. He is in mild respiratory distress.  VITAL SIGNS: Temperature 97.7, heart rate is 115, respiratory rate 28, blood pressure 102/71, and oxygen saturation is 95% on 3 liters nasal cannula.  HEENT: Normocephalic, atraumatic.  NECK: No JVD or carotid bruits.  RESPIRATORY: The patient is mildly tachypneic with some respiratory distress and accessory muscle use. Auscultation reveals bilateral rhonchi.  CARDIOVASCULAR: Normal PMI. Irregularly irregular and slightly tachycardic. No gallops or murmurs are appreciated.  ABDOMEN: Benign, nontender and nondistended.  EXTREMITIES: Trace edema bilaterally.  SKIN: Warm and dry with no rash.  PSYCHIATRIC: He is alert and oriented x 3 with normal mood and affect.   LABORATORY AND DIAGNOSTIC DATA:  His BNP was 664. Creatinine was above baseline at 1.5 with a BUN of 20. Troponin was 0.03. D-dimer was elevated at 1.67. Hemoglobin was 14 with a platelet count of 139.   IMPRESSION:   1.  Dyspnea with mild respiratory distress and possible pulmonary embolism.  2.  Atrial fibrillation with mild rapid ventricular response.  3.  Pleuritic chest pain could be due to #1.  4.  Known history of coronary artery disease status post coronary artery bypass graft.  5.  Mild  acute renal failure.   RECOMMENDATIONS:  The patient appears to be in mild respiratory distress and still actively having chest pain which seems to be pleuritic in nature. He is tachypneic and mildly tachycardic. His V/Q scan was moderate to high probability for pulmonary embolism with elevated D-dimer. Obviously, he is at risk of this given his recent hospitalization and immobility. HE IS UNFORTUNATELY ALLERGIC  TO HEPARIN. Thus, I recommend starting him on bivalirudin drip as an alternative. I recommend transferring him to the Intensive Care Unit and getting an ABG. I will also start him on small dose diltiazem drip to better control his heart rate. An echocardiogram was done and will be reviewed. The patient will require long-term anticoagulation for atrial fibrillation. Further cardiac workup will be decided based on his progress. Once his renal function is improved, he will likely require CTA of the chest to confirm whether he has pulmonary embolism or not.    ____________________________ Mertie Clause. Fletcher Anon, MD maa:si D: 07/08/2012 17:11:51 ET T: 07/08/2012 17:52:57 ET JOB#: [id number]  cc: Nehemie Casserly A. Fletcher Anon, MD, <Dictator> Wellington Hampshire MD ELECTRONICALLY SIGNED 08/06/2012 13:56

## 2014-10-06 NOTE — Discharge Summary (Signed)
PATIENT NAME:  Bryan Scott, Bryan Scott MR#:  [id number] DATE OF BIRTH:  August 12, 1945  DATE OF ADMISSION:  06/21/2012  DATE OF DISCHARGE:  06/25/2012  PRIMARY CARE PHYSICIAN: unknown.  CONSULTATION:  Dr. Raul Del.  DISCHARGE DIAGNOSES: 1.  Angioedema, likely related to angiotensin-converting enzyme inhibitor.  2.  Acute respiratory failure.  3.  Acute renal failure.  4.  Hypertension.  5.  Chronic pain syndrome.   CONDITION:  Stable.   CODE STATUS:  FULL CODE.   HOME MEDICATIONS:  Morphine 30 mg p.o. 3 times a day p.r.n., omeprazole 20 mg 2 caps b.i.d., aspirin 81 mg p.o. daily, Mag-Ox 400, 1 tab p.o. daily, atorvastatin 80 mg p.o. at bedtime, ranitidine 150 mg p.o. b.i.d., docusate/senna 50 mg/8.6 mg p.o. tablets, 1 tablet b.i.d.; Norvasc 10 mg p.o. daily, Zofran 8 mg p.o. tablets, 0.4 tablets every 8 hours p.r.n. for nausea; hydralazine 25 mg p.o. q.8 hours. Stop medication of lisinopril, Norco and Lasix.   DIET:  Low-sodium diet.   ACTIVITY:  As tolerated.   FOLLOWUP CARE:  Follow up with his physician in 1 to 2 weeks, also follow up with Dr. Rudene Christians as outpatient.   REASON FOR ADMISSION:  Difficulty swallowing.   HOSPITAL COURSE: The patient is a 69 year old Caucasian male with a history of hypertension, CAD, chronic pain syndrome, hyperlipidemia, presenting in the ED with difficulty swallowing. He was gargling on secretions. ED physician did an emergent intubation. The patient has swelling of tongue, face and neck. For detailed history and physical examination, please refer to the admission note dictated by Dr. Leslye Peer. Chest x-ray on admission showed intubation of the trachea. WBC was 12.3, hemoglobin 14.3. Glucose 192, BUN 16, creatinine 1.25. The patient was admitted to the CCU due to severe angioedema with dysphagia and respiratory failure, status post intubation. After admission, he was treated with IV steroid, Benadryl and Zantac. In addition, the patient was treated with IV Protonix. Two days  after placed on vent, the patient was extubated. His tongue swelling is much better, and no shortness of breath, cough or chest pain. However, the patient's blood pressure is not real well controlled, so in addition to Norvasc, we added hydralazine 25 mg q.8 hours. In addition, the patient has acute renal failure. BUN increased to 40, creatinine increased to 1.52. After IV fluid support, creatinine became normal, BUN decreased to 28. The patient complains of left shoulder pain where he had surgery before, but basically the patient is clinically stable. He will be discharged to a nursing home, according to physical therapy's recommendations.  Discussed the patient's discharge plan with the patient's case manager.   TIME SPENT:  About 36 minutes.     ____________________________ Demetrios Loll, MD qc:dm D: 06/25/2012 09:41:00 ET T: 06/25/2012 10:13:40 ET JOB#: [id number]  cc: Demetrios Loll, MD, <Dictator> Demetrios Loll MD ELECTRONICALLY SIGNED 06/25/2012 15:22

## 2014-10-06 NOTE — Op Note (Signed)
PATIENT NAME:  Bryan Scott, MOULD MR#:  [id number] DATE OF BIRTH:  [birth year]  DATE OF PROCEDURE:  07/08/2012  PREOPERATIVE DIAGNOSES:  1.  Lack of appropriate IV access.  2.  Hypoxemia.  3.  Pneumonia.  4.  New onset atrial fibrillation.   POSTOPERATIVE DIAGNOSES: 1.  Lack of appropriate IV access. 2.  Hypoxemia. 3.  Pneumonia. 4.  New onset atrial fibrillation.  PROCEDURE PERFORMED: Insertion of left IJ triple lumen catheter with ultrasound guidance.   SURGEON: Katha Cabal, M.D.   DESCRIPTION OF PROCEDURE: The patient is in the Intensive Care Unit. He is critically ill. He is positioned supine. The left neck is prepped and draped in sterile fashion. Ultrasound is placed in a sterile sleeve. Jugular vein is echolucent and compressible, indicating patency. Image is recorded for the permanent record. Under direct ultrasound visualization, a micropuncture needle is inserted into the jugular vein, microwire followed micro sheath, J-wire followed by the dilator.  The triple-lumen catheter is then advanced over the wire. All three lumens aspirate and flush easily and secured to the skin of the neck with 2-0 silk. Sterile dressing is applied with a Biopatch. The patient tolerated the procedure well and there were no immediate complications.   ____________________________ Katha Cabal, MD ggs:eg D: 07/09/2012 16:50:14 ET T: 07/09/2012 23:45:32 ET JOB#: [id number]  cc: Katha Cabal, MD, <Dictator> Katha Cabal MD ELECTRONICALLY SIGNED 08/02/2012 23:24

## 2014-10-06 NOTE — H&P (Signed)
PATIENT NAME:  Bryan Scott, Bryan Scott MR#:  [id number] DATE OF BIRTH:  14-Dec-1945  DATE OF ADMISSION:  02/09/2013  PRIMARY CARE PHYSICIAN:  Nonlocal.  He is with the [facility].  REFERRING PHYSICIAN:  Dr. Lovena Le of the Emergency Department.   HISTORY OF PRESENT ILLNESS:  Bryan Scott is a 69 year old Caucasian gentleman who has past medical history of hypertension, transient A-Fib, history of coronary artery disease status post CABG, history of tobacco abuse, hyperlipidemia as well as GERD.  HE ALSO HAS A HISTORY OF LISINOPRIL CAUSING ANGIOEDEMA and this episode required intubation for approximately 3 days.  He is currently presenting with 2 day duration of chest pain which he describes as a burning sensation which he is unable to localize though.  He does note this is without radiation without relieving or worsening factors.  Is not related to exertion.  He does also note shortness of breath at rest as well as difficulty swallowing which he describes as painful with both liquids as well as solids.  He apparently once again had an episode in the past which was similar and he required intubation beleived to be secondary to ANGIOEDEMA RELATED TO LISINOPRIL   He is for medication testing in about 2 to 3 weeks from now, but has yet to have it performed and he presented to the Emergency Department mainly for his difficulty swallowing.  In the Emergency Department he is known to have an elevated blood pressure in the 170s as well as mention of a headache, but most concerning for him was his odynophagia.  CT was performed which showed no compromise of his airway.    REVIEW OF SYSTEMS:   CONSTITUTIONAL:  Within normal limits.  EYES:  Denies any difficulty in visual acuity.   EARS, NOSE, THROAT AND MOUTH:  He does mention odynophagia as well as dysphagia for the solids and liquids.  No oral lesions.  CARDIOVASCULAR:  Mentions burning chest pain as described as above. no palpitations RESPIRATORY:  As mentioned,  shortness of breath at rest, however denies any dyspnea on exertion or orthopnea.  GASTROINTESTINAL:  Denies any nausea, vomiting, diarrhea, constipation.  GENITOURINARY:  No change with urinary function. MUSCULOSKELETAL:  No pain or other symptoms.   SKIN:  Mentions no lesions. NEUROLOGIC:  He does mention a frontal headache at this time, however there is no further symptoms.   PSYCHIATRIC:  Within normal limits. ENDOCRINE:  Within normal limits.  HEMATOLOGY AND LYMPHATIC:  Within normal limits.  ALLERGY AND IMMUNOLOGY:  No events at this time.   otherwise full review of systems is negative  PAST MEDICAL HISTORY:  Hypertension, GERD, chronic pain syndrome, hyperlipidemia, history of coronary artery disease status post CABG, history of tobacco abuse.  ALLERGIES:  HEPARIN AS WELL AS LISINOPRIL.  LISINOPRIL CAUSING ANGIOEDEMA.  HEPARIN INCREASED RISK OF BLEEDING.    SOCIAL HISTORY:  He has a 30 pack year smoking history, however he has quit about 8 to 9 years ago.  Denies any alcohol or drug use.  He lives by himself.  His sister is here at bedside to confirm his history.    FAMILY HISTORY:  Father died from seizure disorder.  Mother had a cardiac arrest at the late age.    HOME MEDICATIONS:  Include acetaminophen/hydrocodone 325/5 1 or 2 tabs by mouth q. 4 hours as needed for pain, aspirin 81 mg by mouth daily, atorvastatin 80 mg by mouth daily, Coreg 12.5 mg by mouth twice daily, hydrochlorothiazide 25 mg by mouth twice  daily, mag ox 400 mg by mouth twice daily, metformin 1000 mg by mouth twice daily, nitroglycerin 0.4 mg sublingual as needed for chest pain, Prilosec 20 mg by mouth tab twice daily, Zofran 4 mg by mouth q. 4 hours as needed for nausea, ranitidine 150 mg by mouth twice daily.  PHYSICAL EXAMINATION:  VITAL SIGNS:  Temperature 98.3, pulse was 74, respirations 18, blood pressure 170/80 and pulse ox 97 on room air.   GENERAL:  In no acute distress, awake, alert and oriented x 3.    HEENT:  Normocephalic, atraumatic.  Extraocular muscles are intact.  Pupils equal, round, reactive to light as well as accommodation. CARDIOVASCULAR:  S1, S2, regular rate and rhythm.  No murmurs, rubs or gallops at this time.  CHEST:  Nontender to palpation.  PULMONARY:  Clear to auscultation bilaterally without wheezes, rubs or rhonchi.  ABDOMEN:  Soft, nontender, nondistended with positive bowel sounds.   EXTREMITIES:  Reveal no cyanosis, edema or clubbing.   NEUROLOGIC:  Cranial nerves II through XII intact.    LABORATORY DATA:  Sodium 139, potassium 3.6, chloride 101, bicarb 23, BUN 16, creatinine 1.39, glucose 145.  CK MB 1.6, troponin I 0.02.  WBC 16.9, RBC 4.6, hemoglobin 15.2, hematocrit 43.7, platelets 153.   ASSESSMENT AND PLAN:  A 69 year old Caucasian gentleman past medical history of transient atrial fibrillation, coronary artery disease status post coronary artery bypass graft, hypertension, diabetes who presented with 2 day duration of this chest pain described as burning sensation which he is not able to localize without radiation, relieving or worsening factors, not related to exertion and describes no further anginal symptoms.  He does mention shortness of breath at rest.  His reason for presenting initially is odynophagia as well as difficulty swallowing to both liquids and solids.  He had an episode in the past where he was noted with ANGIOEDEMA RELATED TO LISINOPRIL requiring intubation and does have concern. 1.  Nonanginal chest pain.  Given the significant cardiac risk factors including previous coronary disease as well as diabetes and tobacco abuse, would recommend him to be placed on cardiac telemetry as well as have cardiac enzymes trended x 3.  The initial set is negative at this time.  His chest pain is most likely gastrointestinal in nature. 2.  Odynophagia of unclear etiology.  He had a CT evaluation which revealed no problems as far as his airway is concerned.  He has no  evidence of stridor.  If this continues we may need an ENT evaluation. 3.  Coronary artery disease status post coronary artery bypass graft.  We will continue his statin, aspirin and beta-blocker therapy. 4.  Gastroesophageal reflux disease.  Continue with PPI therapy. 5.  Type 2 diabetes.  We will hold his metformin at this time.  Start on insulin sliding scale and check his hemoglobin A1c.    ADMISSION TIME:  30 minutes.    ____________________________ Aaron Mose. Farra Nikolic, MD dkh:ea D: 02/09/2013 23:30:07 ET T: 02/09/2013 23:48:17 ET JOB#: [id number]  cc: Aaron Mose. Tareq Dwan, MD, <Dictator> Chad Tiznado Woodfin Ganja MD ELECTRONICALLY SIGNED 02/10/2013 5:41

## 2014-10-06 NOTE — H&P (Signed)
PATIENT NAME:  Bryan Scott, Bryan Scott MR#:  [id number] DATE OF BIRTH:  [birth year]  DATE OF ADMISSION:  06/21/2012  PRIMARY CARE PHYSICIAN: Dr. Lovie Macadamia.  He also follows at the [facility]: When he came in to the ER was difficulty swallowing.   HISTORY OF PRESENT ILLNESS: This is a 69 year old man who comes in the hospital with difficulty swallowing, then started developing shortness of breath. As per the nurse, he was gurgling on secretions. ER physician did an emergent intubation. X-ray was done, and the trachea is intubated and in good position. The patient had swelling of the tongue, face and neck. Secondary to the intubation, I am unable to get any history from him at this point. History is obtained from old record. I did speak with his sister, Lorna Few, at phone number [phone]. Hospitalist services were contacted for further evaluation.   PAST MEDICAL HISTORY: Coronary artery disease status post myocardial infarction, coronary artery bypass grafting 7 years ago, hypertension, chronic pain, hyperlipidemia, gastroesophageal reflux disease.   PAST SURGICAL HISTORY: Coronary artery bypass grafting 7 years ago. A couple of months ago, he had a left shoulder surgery.   ALLERGIES: HEPARIN, UNKNOWN ALLERGY.   MEDICATION: Bottles that the patient brought in lisinopril 40 mg daily, omeprazole 20 mg 2 tablets b.i.d., Lasix 20 mg daily, ranitidine 150 mg b.i.d., Lipitor 80 mg at bedtime, Zofran 8 mg p.r.n., Norvasc 10 mg daily, Colace/senna combination daily, morphine sulfate extended-release 30 mg t.i.d. p.r.n. pain, aspirin 81 mg daily, Percocet 5/325, 1 to 2 tablets q.4 hours p.r.n. pain, magnesium oxide 400 mg daily.   SOCIAL HISTORY: Lives alone. Unable to elicit smoking history or alcohol history, but as per old chart, he quit smoking 7 years ago   FAMILY HISTORY:  As per old chart included mother died at 2 from natural causes. Father died at 31 of complications with seizures.   REVIEW  OF SYSTEMS: Unable to obtain secondary to being intubated and sedated.   PHYSICAL EXAMINATION:  VITAL SIGNS: On presentation, included a temperature of 97.1, pulse 84, respirations 26, blood pressure 154/95, pulse ox 99% on room air.  GENERAL: The patient is now sedated on the ventilator, breathing comfortably.  EYES: Conjunctivae bloodshot. Lids normal. Pupils equal, round and reactive to light. Extraocular muscles: Unable to be tested secondary to being sedated on ventilator.  EARS, NOSE, MOUTH, AND THROAT: Tympanic membranes: No erythema. Nasal mucosa: No erythema. Throat: Facial area is swollen. Tongue swollen, protruding from mouth. Neck swollen. Lips swollen.  NECK: No JVD. No bruits. No lymphadenopathy. No thyromegaly.  RESPIRATORY: Lungs clear to auscultation. No use of accessory muscles to breathe. No rhonchi, rales or wheeze heard.  CARDIOVASCULAR SYSTEM: S1, S2 normal. No gallops, rubs or murmurs heard. Carotid upstroke 2+ bilaterally. No bruits.  EXTREMITIES:  Dorsalis pedal pulses 2+ bilaterally. No edema of the lower extremities.  ABDOMEN: Soft, nontender. No organo- or splenomegaly. Normoactive bowel sounds. No masses felt.  LYMPHATIC: No lymph nodes in the neck.  MUSCULOSKELETAL: No clubbing, edema or cyanosis.  SKIN: No rashes or ulcers seen.  NEUROLOGICAL: Difficult to test secondary to being sedated on ventilator.  PSYCHIATRIC: Unable to test secondary to being sedated on ventilator.   LABORATORY AND RADIOLOGICAL DATA:  Chest x-ray:  Intubation of the trachea with the tip of the tube at the level of the clavicular heads. White blood cell count 12.3, H and H of 14.3 and 44.0, platelet count of 263. Glucose 192, BUN 16, creatinine 1.25, sodium  136, potassium 4.3, chloride 104, CO2 of 21, calcium 9.3. Liver function tests normal. EKG: Ordered by me.   ASSESSMENT AND PLAN: 1.  Severe angioedema with dysphagia and trouble breathing and severe swelling. The patient has been  intubated. I will admit to the CCU. Solu-Medrol 125 mg IV x1, 60 mg IV q.6 hours. IV Protonix, IV Benadryl around the clock.  2.  Acute respiratory failure with throat closing, was intubated by ER physician. ET Tube in good place. The patient has an ABG ordered. Further adjustment on ventilator will be based off the ABG. He is currently on a tidal volume of 600, rate of 10, PEEP of 5, pressure support of 5 and 40% FiO2 on assist control. The case discussed with pulmonary, Dr. Raul Del, to see the patient. Most likely, he will need 48 hours on the ventilator.  3.  Malignant hypertension. We will give a dose of Norvasc stat, add nitro paste. We will try to hold off on IV medications at this point. Lisinopril is stopped. This is probably the culprit of the severe angioedema. May need another medication in order to compensate.  4.  History of coronary artery disease. The patient is not on a beta blocker, so that may be the next medication we try.  I will hold aspirin at this point.  5.  Chronic pain. Pain medication could be another reason for the severe angioedema, but I need to put him on a fentanyl drip at this time to sedate him on the ventilator. We will hold off on the morphine sulfate and we will continue the Lortab, NG tube.  6.  Hyperlipidemia. Continue Lipitor.  7.  Gastroesophageal reflux disease. We will give IV Protonix.  8.  Impaired fasting glucose. We will check a hemoglobin A1c.   TIME SPENT ON ADMISSION: 55 minutes. The patient will be admitted to the Critical Care Unit.    ____________________________ Tana Conch. Leslye Peer, MD rjw:dm D: 06/21/2012 15:55:00 ET T: 06/21/2012 19:53:51 ET JOB#: [id number]  cc: Youlanda Roys. Lovie Macadamia, [place] Leslye Peer, MD, <Dictator> Marisue Brooklyn MD ELECTRONICALLY SIGNED 07/01/2012 20:00

## 2014-10-06 NOTE — Consult Note (Signed)
Brief Consult Note: Diagnosis: Possible PE, A-fib with RVR, chest pain.   Patient was seen by consultant.   Consult note dictated.   Orders entered.   Discussed with Attending MD.   Comments: He appears in mild respiratory distress.  Recommend transfer to ICU. Start Bivalirudin drip given that he is allergic to Heparin.  Will need CTA chest once renal function is better.  Start Cardizem drip for rate control.  Check echo.  Electronic Signatures: Kathlyn Sacramento (MD)  (Signed 23-Jan-14 17:04)  Authored: Brief Consult Note   Last Updated: 23-Jan-14 17:04 by Kathlyn Sacramento (MD)

## 2014-10-06 NOTE — Consult Note (Signed)
PATIENT NAME:  Bryan Scott, Bryan Scott MR#:  [id number] DATE OF BIRTH:  Jan 24, 1946  DATE OF CONSULTATION:  02/10/2013  REFERRING PHYSICIAN:  Theodoro Grist, MD CONSULTING PHYSICIAN:  Jerene Bears, MD  REASON FOR CONSULTATION: Odynophagia.   HISTORY OF PRESENT ILLNESS: The patient is a 69 year old gentleman with multiple episodes of neck swelling and odynophagia. He has had a history of an orthopedic surgery to his cervical spine. He also had a period of possible angioedema versus infection several years ago and then was recently readmitted this past January for angioedema to presumably lisinopril. He was admitted yesterday for burning in his chest and feels like he has pain in his neck bilaterally. He points to his lateral neck when describing this. Given his history, he got a CT scan which did not demonstrate any soft tissue abnormality in his neck and no evidence of infection, but it did reveal some carotid artery stenosis bilaterally. I was asked for evaluation given his persistent odynophagia.   PAST MEDICAL HISTORY:  1. Hypertension.  2. GERD. 3. Chronic pain syndrome.  4. Hyperlipidemia.  5. Coronary artery disease.  6. History of tobacco abuse.  7. Angioedema requiring intubation.   PAST SURGICAL HISTORY: CABG.   ALLERGIES: HEPARIN AND LISINOPRIL.  SOCIAL HISTORY: The patient has a 30-pack-year history of smoking, but quit 8 or 9 years ago. He lives by himself.   FAMILY HISTORY: Seizure disorder, cardiac arrest.   CURRENT HOME MEDICATIONS: Lortab, aspirin, atorvastatin, Coreg, hydrochlorothiazide, Mag-Ox, metformin, nitroglycerin, Prilosec, Zofran and Zantac.   PHYSICAL EXAMINATION:  GENERAL: He is a well-nourished, well-developed male in no acute distress. He is lying supine in bed. There is no stertor or stridor.  EARS: EACs are clear.  NOSE: Clear anteriorly.  ORAL CAVITY AND OROPHARYNX: Reveal no abnormal masses or lesions. The patient has a very prominent gag reflex.  NECK:  Supple. No lymphadenopathy or thyromegaly. The patient is tender to palpation over sternocleidomastoid muscles bilaterally.   PROCEDURE: Transnasal flexible laryngoscopy.   PREPROCEDURE DIAGNOSIS: Odynophagia.  POSTPROCEDURE DIAGNOSIS: Odynophagia.  DESCRIPTION OF PROCEDURE: After verbal consent was obtained, the patient's nasal cavities were anesthetized with Genasal and lidocaine, and transnasal flexible laryngoscopy was performed. This demonstrated no significant abnormal masses or lesions in the patient's nasal cavity, nasopharynx, pharynx or larynx. True vocal folds are mobile bilaterally.   A CT scan is reviewed which demonstrated no significant abnormality of his airway, but carotid artery narrowing bilaterally.   IMPRESSION: Odynophagia and long-standing history of dysphagia.   PLAN: I discussed my findings with the patient and his sister. I certainly do not see any evidence of any abnormality on ENT exam. Whether this is related to his carotid artery disease or not I am unsure. I did reassure the patient his airway is open. There is no evidence of any angioedema or infection or any airway narrowing. This may be related to his previous cervical surgery or it may be a new etiology. If this persists, he may benefit from proceeding with a GI evaluation. The patient and his sister demonstrate understanding and agree with plan. Please reconsult me with any further questions.    ____________________________ Jerene Bears, MD ccv:OSi D: 02/11/2013 08:42:00 ET T: 02/11/2013 09:01:24 ET JOB#: [id number]  cc: Jerene Bears, MD, <Dictator> Jerene Bears MD ELECTRONICALLY SIGNED 02/16/2013 17:07

## 2014-10-06 NOTE — Consult Note (Signed)
CHIEF COMPLAINT and HISTORY:  Subjective/Chief Complaint neck pain/swelling   History of Present Illness patient is admitted for difficulty breathing, neck pain, and swelling.  Had a similar episode several months ago and had angioedema  requiring intubation for several days.  Feels similarly but not as bad.  Had evaluation by ENT earlier.  CT scan was done to look for masses.  Incidental finding of >75% bilateral ICA stenosis.  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  ALLERGIES:  Allergies:  Lisinopril: Angioedema  Heparin: Unknown  HOME MEDICATIONS:  Home Medications: Medication Instructions Status  omeprazole 20 mg oral delayed release capsule 2 cap(s) orally 2 times a day 30 minutes before a meal to control stomach acid Active  ondansetron 8 mg oral tablet 0.5 tab(s) orally every 8 hours, As Needed- for Nausea Active  acetaminophen-hydrocodone 325 mg-5 mg oral tablet 1-2 tab(s) orally every 4 hours, As Needed - for Pain Active  Aspirin Enteric Coated 81 mg oral delayed release tablet 1 tab(s) orally once a day for heart protection Active  atorvastatin 80 mg oral tablet 1 tab(s) orally once a day (at bedtime) for cholesterol Active  magnesium oxide 400 mg oral tablet 1 tab(s) orally 2 times a day Active  nitroglycerin 0.4 mg sublingual tablet 1 tab(s) sublingual every 5 minutes, As Needed - for Chest Pain Active  ranitidine 150 mg oral tablet 1 tab(s) orally 2 times a day Active  metFORMIN 1000 mg oral tablet 1 tab(s) orally 2 times a day for diabetes Active  hydrochlorothiazide 25 mg oral tablet 1 tab(s) orally once a day for blood pressure Active  carvedilol 12.5 mg oral tablet 1 tab(s)  orally every 12 hours for blood pressure Active   Family and Social History:  Family History seizure disorder, heart disease   Social History positive tobacco (Greater than 1 year), negative ETOH   Place of Living Home   Review of Systems:  Fever/Chills No   Cough Yes   Sputum No   Abdominal Pain No   Diarrhea No   Constipation No   Nausea/Vomiting No   SOB/DOE Yes   Chest Pain Yes   Telemetry Reviewed NSR   Dysuria No   Tolerating PT Yes   Tolerating Diet Yes   Medications/Allergies Reviewed Medications/Allergies reviewed   Physical Exam:  GEN well developed, well nourished   HEENT hearing intact to voice, moist oral mucosa   NECK No masses  trachea midline   RESP normal resp effort  no use of accessory muscles   CARD regular rate  no JVD   VASCULAR ACCESS none   ABD denies tenderness  normal BS   GU no superpubic tenderness   LYMPH negative neck, negative axillae   EXTR negative cyanosis/clubbing, negative edema   SKIN normal to palpation, skin turgor good   NEURO cranial nerves intact, motor/sensory function intact   PSYCH alert, A+O to time, place, person   LABS:  Laboratory Results: Hepatic:    27-Aug-14 20:05, Comprehensive Metabolic Panel  Bilirubin, Total 1.0  Alkaline Phosphatase 96  SGPT (ALT) 28  SGOT (AST) 21  Total Protein, Serum 7.6  Albumin,  Serum 3.8  Cardiology:    28-Aug-14 08:11, ECG  Ventricular Rate 71  Atrial Rate 71  P-R Interval 182  QRS Duration 104  QT 444  QTc 482  P Axis 46  R Axis 35  T Axis 97  ECG interpretation   Normal sinus rhythm  ST & T wave abnormality, consider lateral ischemia  Prolonged QT  Abnormal ECG  When compared with ECG of 05-Aug-2012 17:37,  Criteria for Inferior infarct are no longer Present  T wave inversion more evident in Lateral leads  Confirmed by Fletcher Anon, MUHAMMAD ([id number]) on 02/10/2013 11:09:57 AM    Overreader: Kathlyn Sacramento  ECG   Routine Chem:    27-Aug-14  20:05, B-Type Natriuretic Peptide [facility])  B-Type Natriuretic Peptide [facility]) 161  Result(s) reported on 09 Feb 2013 at 08:34PM.    27-Aug-14 20:05, Comprehensive Metabolic Panel  Glucose, Serum 145  BUN 16  Creatinine (comp) 1.39  Sodium, Serum 135  Potassium, Serum 3.6  Chloride, Serum 101  CO2, Serum 23  Calcium (Total), Serum 9.5  Osmolality (calc) 274  eGFR (African American) >60  eGFR (Non-African American) 52  eGFR values <25mL/min/1.73 m2 may be an indication of chronic  kidney disease (CKD).  Calculated eGFR is useful in patients with stable renal function.  The eGFR calculation will not be reliable in acutely ill patients  when serum creatinine is changing rapidly. It is not useful in   patients on dialysis. The eGFR calculation may not be applicable  to patients at the low and high extremes of body sizes, pregnant  women, and vegetarians.  Anion Gap 11    27-Aug-14 20:05, Hemoglobin A1c ([facility])  Hemoglobin A1c ([facility]) 6.8  The American Diabetes Association recommends that a primary goal of  therapy should be <7% and that physicians should reevaluate the  treatment regimen in patients with HbA1c values consistently >8%.    28-Aug-14 37:34, Basic Metabolic Panel (w/Total Calcium)  Glucose, Serum 243  BUN 15  Creatinine (comp) 1.35  Sodium, Serum 133  Potassium, Serum 3.7  Chloride, Serum 100  CO2, Serum 23  Calcium (Total), Serum 9.5  Anion Gap 10  Osmolality (calc) 275  eGFR (African American) >60  eGFR (Non-African American) 54  eGFR values <62mL/min/1.73 m2 may be an indication of chronic  kidney disease (CKD).  Calculated eGFR is useful in patients with stable renal function.  The eGFR calculation will not be reliable in acutely ill patients  when serum creatinine is changing rapidly. It is not useful in   patients on dialysis. The eGFR calculation may not be applicable  to patients at the low and high extremes of body sizes, pregnant  women, and vegetarians.   Cardiac:    27-Aug-14 20:05, Cardiac Panel  CK, Total 143  CPK-MB, Serum 1.6  Result(s) reported on 09 Feb 2013 at 08:34PM.    27-Aug-14 20:05, Troponin I  Troponin I < 0.02  0.00-0.05  0.05 ng/mL or less: NEGATIVE   Repeat testing in 3-6 hrs   if clinically indicated.  >0.05 ng/mL: POTENTIAL   MYOCARDIAL INJURY. Repeat   testing in 3-6 hrs if   clinically indicated.  NOTE: An increase or decrease   of 30% or more on serial   testing suggests a   clinically important change    28-Aug-14 04:20, Cardiac Panel  CK, Total 98  CPK-MB, Serum 0.8  Result(s) reported on 10 Feb 2013 at 05:01AM.    28-Aug-14 04:20, Troponin I  Troponin I < 0.02  0.00-0.05  0.05 ng/mL or less: NEGATIVE   Repeat testing in 3-6 hrs   if clinically indicated.  >0.05 ng/mL: POTENTIAL   MYOCARDIAL INJURY. Repeat   testing in 3-6 hrs if   clinically indicated.  NOTE: An increase or decrease   of 30% or more on serial   testing suggests a   clinically important change    28-Aug-14 12:50, Cardiac Panel  CK, Total 89  CPK-MB, Serum 0.9  Result(s) reported on 10 Feb 2013 at 02:10PM.    28-Aug-14 12:50, Troponin I  Troponin I < 0.02  0.00-0.05  0.05 ng/mL or less: NEGATIVE   Repeat testing in 3-6 hrs   if clinically indicated.  >0.05 ng/mL: POTENTIAL   MYOCARDIAL INJURY. Repeat   testing in 3-6 hrs if   clinically indicated.  NOTE: An increase or decrease   of 30% or more on serial   testing suggests a   clinically important change  Routine Hem:    27-Aug-14 20:05, Hemogram, Platelet Count  WBC (CBC) 16.8  RBC (CBC) 4.64  Hemoglobin (CBC) 15.2  Hematocrit (CBC) 43.7  Platelet Count (CBC) 153  Result(s) reported on 09 Feb 2013 at 08:22PM.  MCV 94  MCH 32.7  MCHC 34.7  RDW 13.9    28-Aug-14 04:20, CBC Profile  WBC (CBC) 15.9  RBC (CBC) 4.52  Hemoglobin (CBC) 14.9  Hematocrit (CBC) 42.6  Platelet Count (CBC) 152  MCV 94  MCH 33.0  MCHC 35.0  RDW 13.6  Neutrophil % 94.6   Lymphocyte % 4.6  Monocyte % 0.7  Eosinophil % 0.0  Basophil % 0.1  Neutrophil # 15.0  Lymphocyte # 0.7  Monocyte # 0.1  Eosinophil # 0.0  Basophil # 0.0  Result(s) reported on 10 Feb 2013 at 07:54AM.   RADIOLOGY:  Radiology Results: XRay:    16-Nov-13 21:45, Shoulder Left Complete  Shoulder Left Complete  REASON FOR EXAM:    fall injury  COMMENTS:       PROCEDURE: DXR - DXR SHOULDER LEFT COMPLETE  - May 01 2012  9:45PM     RESULT: History: Fall    Comparison: None    Findings:    2 views of the left shoulder demonstrates a comminuted fracture of the   proximal left humerus involving the surgical neck and proximal diaphysis.   The fracture cleft extends through the greater tuberosity. There is no   glenohumeral dislocation. The acromioclavicular joint is normal.  IMPRESSION:     Please see above.    Dictation Site: 1        Verified By: Jennette Banker, M.D., MD    [phone] 23:29, Chest Portable Single View  Chest Portable Single View  REASON FOR EXAM:    fall weakness shoulder pain  COMMENTS:       PROCEDURE: DXR - DXR PORTABLE CHEST SINGLE VIEW  - May 01 2012 11:29PM     RESULT: Comparison: None    Findings:     Single portable AP chest radiograph is provided.  There is no focal   parenchymal opacity, pleural effusion, or pneumothorax. Normal   cardiomediastinal silhouette. Prior CABG. The osseous structures are   unremarkable.    IMPRESSION:   No acute disease of the chest.    Dictation Site: 1        Verified By: Jennette Banker, M.D., MD    [phone] 18:08, Humerus Left  Humerus Left  REASON FOR EXAM:    post op  COMMENTS:  LMP: (Male)    PROCEDURE: DXR - DXR HUMERUS LEFT  - May 03 2012  6:08PM     RESULT: Multiple skin staples are present over the mid proximal humerus.   There is a orthopedic plate with multiple screws from the femoral head   into the mid femoral shaft. Anatomic alignment has been achieved.    IMPRESSION:  Please see  above.    Dictation Site: 1        Verified By: Sundra Aland, M.D., MD    06-Jan-14 15:13, Chest Portable Single View  Chest Portable Single View  REASON FOR EXAM:    post intubation  COMMENTS:       PROCEDURE: DXR - DXR PORTABLE CHEST SINGLE VIEW  - Jun 21 2012  3:13PM     RESULT: Comparison is made to the study of May 01, 2012.    The endotracheal tube tip lies at the level of the clavicular heads. An   esophagogastric tube is present but its tip is not clearly visible below   the hemidiaphragms. The cardiac silhouette is not enlarged. The patient   has undergone previous CABG. The interstitial markings are mildly   prominent bilaterally.    IMPRESSION:  The patient has undergone interval intubation of the trachea   with the tip the tube at the level of the clavicular heads.   Dictation Site: 2        Verified By: DAVID A. [place], M.D., MD    23-Jan-14 06:58, Chest Portable Single View  Chest Portable Single View  REASON FOR EXAM:    cp  COMMENTS:       PROCEDURE: DXR - DXR PORTABLE CHEST SINGLE VIEW  - Jul 08 2012  6:58AM     RESULT: Comparison made to prior study 06/21/2012. Interim removal of NG   tube and endotracheal tube. Prior CABG. Cervical spine fusion.Left lower   lobe atelectasis and infiltrate. No pulmonary venous congestion.    IMPRESSION:  Left lower lobe atelectasis and/or infiltrate. Interim   removal of endotracheal tube and NG tube from prior chest x-ray of   06/21/2012. CABG. Heart size minimal enlarged. No pulmonary venous   congestion.      Verified By: Osa Craver, M.D., MD    24-Jan-14 16:07, Chest Portable Single View  Chest Portable Single View  REASON FOR EXAM:    central line placement  COMMENTS:       PROCEDURE: DXR - DXR PORTABLE CHEST SINGLE VIEW  - Jul 09 2012  4:07PM     RESULT: Frontal view of the chest dated 07/09/2012 comparison to prior   study dated 07/08/2012    Findings: A left-sided central venous catheter  has been inserted with tip   projecting regions appear vena cava. Without evidence of a pneumothorax.   Patient's taken aspiration. Vague area of increased density projects   within the left lung base. Cardiac silhouette and visualized bony   skeleton unremarkable.    IMPRESSION:  Left-sided central venous catheter as described above.  2. Atelectasis versus infiltrate left lung base.        Verified By: Mikki Santee, M.D., MD    27-Aug-14 19:31, Chest Portable Single View  Chest Portable Single View  REASON FOR EXAM:    SOB  COMMENTS:       PROCEDURE: DXR - DXR PORTABLE CHEST SINGLE VIEW  - Feb 09 2013  7:31PM     RESULT: Comparison is made to the study of  July 09, 2012.    The lungs are adequately inflated. There is no focal infiltrate. The   cardiac silhouette is normal in size. The mediastinum is normal in width.   The patient has undergone previous CABG.    IMPRESSION:  There is no evidence of pneumonia. Minimal prominence of the   interstitial markings is present but stable. There maybe underlying   reactive airway disease or COPD.  A followup PA and lateral chest x-ray would be of value if the patient   has persistent cardiopulmonary symptoms.     Dictation Site: 2        Verified By: DAVID A. [place], M.D., MD  Fluoroscopy:    [phone] 16:51, C-Arm 2 Views Shoulder Left  C-Arm 2 Views Shoulder Left  REASON FOR EXAM:    left shoulder fx  COMMENTS:       PROCEDURE: DXR - DXR C-ARM 2 VIEWS LEFT SHOULDER  - May 03 2012  4:51PM     RESULT: Comparison: Left humerus 05/01/2012    Findings:  Three fluoroscopic images were submitted from intraoperative procedure.   Cortical fixation plate and screws traverse the proximal humerus   fracture. There is a metallic density which overlies the humeral head   which is of uncertain etiology. Clinical correlation is suggested. The   remainder of the interpretation is deferred to the performing clinician.    IMPRESSION:    Please see above.    Dictation site: 2        Verified By: Lewie Chamber, M.D., MD  [place]:    23-Jan-14 12:02, US Abdomen Limited Survey  US Abdomen Limited Survey  REASON FOR EXAM:    abnormal LFT's  COMMENTS:   Body Site: Right Upper Quad; Liver    PROCEDURE: [place]  - US ABDOMEN LIMITED SURVEY  - Jul 08 2012 12:02PM     RESULT:     FINDINGS: Limited right upper quadrant Abdominal Ultrasound shows the   liver length is 14.15 cm. The common bile duct diameter is 8.2 mm. No   gallstones are evident. The gallbladder wall thickness is 1.7 mm. There   is limited visualization of the pancreas without gross abnormality. The   liver echotexture is echogenic without a focal mass or intrahepatic   ductal dilation. No ascites is evident. Portal venous flow is normal.    IMPRESSION:  1. Common bile duct distention up to as much as 8.2 mm. There is no   evidence of cholelithiasis. No ascites or abnormal fluid collection is   seen.   2. Hepatic steatosis is likely present.    Dictation Site: 2    Thank you for this opportunity to contribute to the care of your patient.         Verified By: Elveria Royals, M.D., MD    23-Jan-14 12:02, [place] Color Flow Doppler Low Extrem Bilat  [place] Color Flow Doppler Low Extrem Bilat  REASON FOR EXAM:    elevated D-dimer  COMMENTS:       PROCEDURE: [place]  - US DOPPLER LOW EXTR BILATERAL  - Jul 08 2012 12:02PM     RESULT: Duplex Doppler interrogation of the deep venous system of both   legs from the inguinal to the popliteal region demonstrates the deep   venous systems are fully compressible throughout. The color Doppler and   spectral Doppler appearance is normal. There is normal response to distal   augmentation. The color Doppler images show no filling defect.  IMPRESSION:    1. No evidence of DVT in either lower extremity.    Dictation Site: 2    Verified By: Sundra Aland, M.D., MD  LabUnknown:    16-Nov-13 21:36, CT Head Without  Contrast  PACS Image    [phone] 21:40, CT Cervical Spine Without Contrast  PACS Image    16-Nov-13 21:45, Shoulder Left Complete  PACS Image    16-Nov-13 22:38, CT Shoulder Left Without Contrast  PACS Image    16-Nov-13 23:29, Chest Portable Single View  PACS Image    18-Nov-13 16:51, C-Arm 2 Views Shoulder Left  PACS Image    18-Nov-13 18:08, Humerus Left  PACS Image    06-Jan-14 15:13, Chest Portable Single View  PACS Image    23-Jan-14 06:58, Chest Portable Single View  PACS Image    23-Jan-14 12:02, US Abdomen Limited Survey  PACS Image    23-Jan-14 12:02, [place] Color Flow Doppler Low Extrem Bilat  PACS Image    23-Jan-14 13:10, Lung VQ Scan - Nuc Med  PACS Image    24-Jan-14 16:07, Chest Portable Single View  PACS Image    24-Jan-14 18:10, CT Chest for Pulm Embolism With Contrast  PACS Image    21-Feb-14 00:14, CT Abdomen and Pelvis With Contrast  PACS Image    27-Aug-14 19:31, Chest Portable Single View  PACS Image    27-Aug-14 22:12, CT Neck With Contrast  PACS Image  CT:    16-Nov-13 21:36, CT Head Without Contrast  CT Head Without Contrast  REASON FOR EXAM:    fall head injury  COMMENTS:       PROCEDURE: CT  - CT HEAD WITHOUT CONTRAST  - May 01 2012  9:36PM     RESULT: Comparison:  None    Technique: Multiple axial images from the foramen magnum to the vertex   were obtained without IV contrast.    Findings:      There is no evidence of mass effect, midline shift, or extra-axial fluid   collections.  There is no evidence of a space-occupying lesion or   intracranial hemorrhage. There is no evidence of a cortical-based area of     acute infarction.      The ventricles and sulci are appropriate for the patient's age. The basal   cisterns are patent.    Visualized portions of the orbits are unremarkable. The visualized   portions of the paranasal sinuses and mastoid air cells are unremarkable.     The osseous structures are  unremarkable.    IMPRESSION:      No acute intracranial process.    Dictation Site: 1        Verified By: Jennette Banker, M.D., MD    [phone] 21:40, CT Cervical Spine Without Contrast  CT Cervical Spine Without Contrast  REASON FOR EXAM:    fall head injury  COMMENTS:       PROCEDURE: CT  - CT CERVICAL SPINE WO  - May 01 2012  9:40PM     RESULT: Clinical Indication: Trauma    Comparison: None    Technique: Multiple axial CT images from the skull base to the mid   vertebral body of T1. obtained with sagittal and coronal reformatted   images provided.    Findings:   The alignment is anatomic. The vertebral body heights are maintained.   There is no acute fracture or static listhesis. The prevertebral soft   tissuesare normal. The intraspinal soft tissues are not fully  imaged on   this examination due to poor soft tissue contrast, but there is no soft   tissue gross abnormality.    There is intervertebral disc fusion from C5 through C7 without hardware   failure or complication. There is osseous bridging across the C5-C6 and   C6-C7 disc spaces. There is posterior decompression at the C3-C5.    The visualized portions of the lung apices demonstrate no focal   abnormality.    IMPRESSION:     1. No acute osseous injury of the cervical spine.    2. Ligamentous injury is not evaluated. If there is high clinical concern   for ligamentous injury, consider MRI or flexion/extension radiographs as   clinically indicated and tolerated.     Dictation Site: 1        Verified By: Jennette Banker, M.D., MD    [phone] 22:38, CT Shoulder Left Without Contrast  CT Shoulder Left Without Contrast  REASON FOR EXAM:    fall injury fx  COMMENTS:       PROCEDURE: CT  - CT SHOULDER LEFT WO  - May 01 2012 10:38PM     RESULT: History: Fall    Comparison: None    Technique: Multiple axial images obtained of the left shoulder with   sagittal and coronal reformatted images  provided.    Findings:    The left shoulder are demonstrated a comminuted fracture of the proximal     left humerus involving the surgical neck and proximal diaphysis. The   fracture cleft extends through the greater tuberosity. The fracture does   not involve the articular surface. There is no glenoid fracture. There   are mild degenerative changes of the [facility] joint.    IMPRESSION:     Comminuted fracture of the left proximal humerus.        Verified By: Jennette Banker, M.D., MD    24-Jan-14 18:10, CT Chest for Pulm Embolism With Contrast  CT Chest for Pulm Embolism With Contrast  REASON FOR EXAM:    Elevated d-dimer, hypoxia, abnormal V/Q Scan  COMMENTS:       PROCEDURE: CT  - CT CHEST (FOR PE) W  - Jul 09 2012  6:10PM     RESULT: Chest CT is performed with 100 mL of Isovue-370 iodinated   intravenous contrast with images reconstructed at 3.0 mm slice thickness   in the axial plane. The patient has no previous CT of the chest for   comparison.    There is excellent opacification of the pulmonary arteries without   evidence of a pulmonary embolic filling defect. The thoracic aorta is   normal in caliber without evidence of dissection. Patchy areas of   increased density are seen at the lung bases especially in the left lower   lobe and lingula. There is some peribronchial thickening suggestive of     bronchitis. Some atelectasis is present in the right lower lobe. There is   some mild pleural thickening without significant pleural effusion. There   is no mediastinal or hilar mass or adenopathy. Sternotomy wires are   present. There is no filling defect in the rightventricle or right   atrium. No axillary mass or adenopathy is demonstrated. No central airway   obstruction is evident. The included upper abdominal structures show no   gross abnormality.    IMPRESSION:   1. No findings of pulmonary embolism. Patchy areas of presumed   atelectasis. No thoracic aortic aneurysm  or dissection.  No adenopathy or   focal pulmonary mass demonstrated.    Dictation Site: 6    Verified By: Sundra Aland, M.D., MD    21-Feb-14 00:14, CT Abdomen and Pelvis With Contrast  CT Abdomen and Pelvis With Contrast  REASON FOR EXAM:    (1) diarrhea and bilious emesis eval obstruction; (2)   diarrhea and bilious emesi  COMMENTS:       PROCEDURE: CT  - CT ABDOMEN / PELVIS  W  - Aug 06 2012 12:14AM     RESULT: Axial CT scanning was performed through the abdomen andpelvis   with reconstructions at 3 mm intervals and slice thicknesses. The patient   received 100 cc of Isovue-300 but did not receive oral contrast material.   Review of multiplanar reconstructed images was performed separately on   the VIA monitor.Comparison is made to a study of June 28, 2009.    The liver exhibits no focal mass or ductal dilation. The gallbladder,   pancreas, spleen, nondistended stomach, adrenal glands, and kidneys   exhibit no acute abnormality. There are large nonobstructing stones in     both kidneys. The perinephric fat is normal in appearance. There are   surgical clips in the periaortic region to the left. The caliber of the   abdominal aorta is normal. There is no periaortic or pericaval   lymphadenopathy.    The unopacified loops of small and large bowel exhibit no evidence of   ileus nor obstruction nor acute inflammation. The partially distended   urinary bladder is normal in appearance. The prostate gland is mildly   enlarged and produces an impressionupon the urinary bladder base. There   is no inguinal nor umbilical hernia. The lung bases exhibit no acute   abnormalities. The lumbar vertebral bodies are preserved in height. There   is disc space narrowing at L3-L4, L4-L5, and L5-S1.    IMPRESSION:   1. There is no evidence of acute hepatobiliary abnormality.  2. There is no evidence of bowel obstruction or ileus.  3. There are nonobstructing stones in both kidneys. A  previously   demonstrated ureteral stent on the right has been removed. Neither kidney   exhibits obstruction or surrounding inflammatory change.    A preliminary report was sent to the emergency department at the   conclusion of the study.     Dictation Site: 1        Verified By: DAVID A. [place], M.D., MD    27-Aug-14 22:12, CT Neck With Contrast  CT Neck With Contrast  REASON FOR EXAM:    stridor and throat swelling  COMMENTS:       PROCEDURE: CT  - CT NECK WITH CONTRAST  - Feb 09 2013 10:12PM     RESULT: Indication: Stridor, throat swelling    Comparison: None    Technique: Multiple sequential axial images from the apices of the lungs   to the level of the orbits obtained with 100 ml Isovue-370 IV contrast.    Findings:  There is no lymphadenopathy. There is no nasopharyngeal or   oropharyngeal mass. There is no focal fluid collection. The airway is   patent. There is complex atherosclerotic plaque within the carotid bulbs     bilaterally resulting in greater than 75% stenosis of the proximal ICAs   bilaterally.    The visualized portions of the brain demonstrate no focal abnormality.    The paranasal sinuses are clear.     There is no lytic or  blastic osseous lesion. There is cervical   spondylosis noted.    IMPRESSION:      1. No acute disease of the neck.    2.  There is complex atherosclerotic plaque within the carotid bulbs     bilaterally resulting in greater than 75% stenosis of the proximal ICAs   bilaterally. Vascular surgery consultation is recommended.      Dictation Site: 1        Verified By: Jennette Banker, M.D., MD  Nuclear Med:    23-Jan-14 13:10, Lung VQ Scan - Nuc Med  Lung VQ Scan - Nuc Med  REASON FOR EXAM:    elevated d-dimer, cp  COMMENTS:       PROCEDURE: NM  - NM VQ LUNG SCAN  - Jul 08 2012  1:10PM     RESULT: Radiograph of the chest shows CABG changes are present. There is   elevation of the left hemidiaphragm. There may be  a smallleft pleural   effusion. Study is from 08 July 2012 at 6:55 a.m. The blunting of the   left costophrenic angle was new compared to an earlier study from 21 June 2012.    The patient received an injection of 4.17 mCi of technetium 99 M MAA for   perfusion images. 43.22 mCi of technetium 25 M DTPA were placed in the   nebulizer for the ventilation images.  Ventilation images show extensive abnormal ventilatory defects in both   lungs with diffusely decreased localization bilaterally. There is   essentially no activity in the left lower lobe which could represent   ventilatory obstruction such as mucous plugging or mass. There are   subsegmental and possibly segmental defects elsewhere in the left upper   lobe and right lung.    The perfusion pattern almost exactly correlates with the ventilation   pattern showing significant subsegmental, segmental and left lower lobar   perfusion defects.    IMPRESSION:  Abnormal ventilation/perfusion studies. Large areas of   ventilation and perfusion defect. Pulmonary embolism is not excluded. The   possibility of pulmonary embolism is intermediate to high. The patient   should undergo contrast-enhanced CT a if at all possible. Recent lower     extremity Doppler was negative for DVT.    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 However, these high grade lesions are at a high risk of stroke if treated medically alone (>5% per year).   Given his neck symptoms and swelling, he may be best served by carotid stents and not open surgery as he would seem to be a high risk of airway issues post operatively even if swelling after surgery is minimal.   This does not need to be done on this admission or in the immediate future.    would recommend starting on Plavix 75 mg today if felt safe by other services.  Would plan to see in office in 1-2 weeks to discuss timing of the procedures.   level 4   Electronic Signatures: Algernon Huxley (MD)  (Signed 28-Aug-14 17:55)  Authored: Chief Complaint and History, PAST MEDICAL/SURGICAL HISTORY, ALLERGIES, HOME MEDICATIONS, Family and Social History, Review of Systems, Physical Exam, LABS, RADIOLOGY, Assessment and Plan   Last Updated: 28-Aug-14 17:55 by Algernon Huxley (  MD) 

## 2014-10-06 NOTE — Discharge Summary (Signed)
PATIENT NAME:  Bryan Scott, Bryan Scott MR#:  [id number] DATE OF BIRTH:  07-02-1945  DATE OF ADMISSION:  02/15/2013 DATE OF DISCHARGE:  02/16/2013  ADMITTING DIAGNOSIS:  Chest pain.   DISCHARGE DIAGNOSES:   1.  Chest pain felt to be atypical in nature and noncardiac, possibly GI-related.  The patient has outpatient gastroenterology follow-up.   2.  Hypertension.  3.  Chronic pain syndrome.  4.  Lumbar disk disease with chronic back pain and neck pain.  5.  Hyperlipidemia.  6.  Gastroesophageal reflux disease.   7.  Status post hip surgery.  8.  Status post laminectomy.  9.  Status post coronary artery bypass graft.  10.  Status post 2 neck surgeries.  11.  Diabetes.  CONSULTANTS: Dr. Clayborn Bigness   PERTINENT LABORATORY AND EVALUATIONS:  Hemoglobin A1c was 6.8.   Chest x-ray shows no evidence of pneumonia.  Minimal prominence of the interstitial markings.   His WBC count was 16.8,  hemoglobin is 15.2, platelet count was 153. His glucose 145, BUN 16, creatinine 1.39, sodium 135, potassium 3.6, chloride 101, CO2 was 23. LFTs were normal.   EKG: Sinus rhythm with nonspecific ST-T wave changes.   H. pylori antibody panel was negative.   Cardiac catheterization results official are pending per Dr. Clayborn Bigness. Cardiac catheterization showed patent saphenous vein graft, unchanged from catheter in 2011, ejection fraction 50%.   HOSPITAL COURSE: Please refer to H and P done by the admitting physician. The patient is a 69 year old who has history of hypertension, coronary artery disease, GERD, who was recently hospitalized with chief complaint of chest pain. He was ruled out and found to have a carotid artery stenosis. He was told to follow up as an outpatient. The patient comes back with the same complaints. Therefore, he was taken to cardiac catheterization by Dr. Clayborn Bigness. The cardiac catheterization showed patent grafts and he recommended no further cardiac work-up. The patient continues to have these chest  pain symptoms. This may be GI-related. He is on aggressive regimen for GERD. His H. pylori testing was negative. At this time, he is to follow up with outpatient GI for any further evaluation. He is currently stable for discharge.   DISCHARGE MEDICATIONS: Zofran 4 mg q. 8 p.r.n. for nausea, atorvastatin 80 at bedtime, Mag-Oxide 400, 1 tab p.o. b.i.d., nitroglycerin 0.4 sublingual q. 5 p.r.n., ranitidine 150 b.i.d., metformin 1000, 1 tab p.o. b.i.d., hydrochlorothiazide 25 p.o. daily, carvedilol 25 mg 1 tab p.o. b.i.d., Plavix 75 p.o. daily, sucralfate 1 gram 4 times a day, omeprazole 40 mg 1 tab p.o. b.i.d., aspirin 81 mg 1 tab p.o. daily, acetaminophen/hydrocodone 325/5, 1 to 2 tabs q. 4 p.r.n.   DIET: Low sodium, low fat, low cholesterol, carbohydrate-controlled diet.   ACTIVITY: As tolerated.   FOLLOW-UP: [facility] in 2 to 4 weeks for further gastrointestinal evaluation and follow-up primary M.D. in 1 to 2 weeks. The patient to keep appointment with Dr. Lucky Cowboy for carotid artery stenosis.   TIME SPENT: 35 minutes spent on this discharge.    ____________________________ Lafonda Mosses. Posey Pronto, MD shp:cc D: 02/16/2013 20:47:07 ET T: 02/17/2013 00:57:57 ET JOB#: [id number]  cc: Florencia Zaccaro H. Posey Pronto, MD, <Dictator> Alric Seton MD ELECTRONICALLY SIGNED 02/18/2013 15:14

## 2014-10-06 NOTE — Discharge Summary (Signed)
PATIENT NAME:  Bryan Scott, Bryan Scott MR#:  [id number] DATE OF BIRTH:  [birth year]  ADMITTING DIAGNOSIS: Chest pain.  DISCHARGE DIAGNOSES: 1.  Dysphagia. 2.  Odynophagia, likely due to gastroesophageal reflux disease. 3.  Mild dehydration. 4.  Atypical chest pain. 5.  Bilateral internal carotid artery stenoses of possibly more than 75%. 6.  Diabetes mellitus, type 2, well controlled with hemoglobin A1c 6.8. 7.  Malignant hypertension. 8.  Suspected chronic renal insufficiency. 9.  Gastroesophageal reflux disease.  DISCHARGE CONDITION:  Stable.  DISCHARGE MEDICATIONS:  The patient is to continue Zofran 8 mg  tablet every 8 hours as needed, acetaminophen/hydrocodone 325/5 mg 1 to 2 tablets every 4 hours as needed, atorvastatin 80 mg p.o. at bedtime, magnesium oxide 400 p.o. twice daily, nitroglycerin 0.4 mg sublingually every 5 minutes as needed, ranitidine 150 mg p.o. twice daily, metformin 1 gram twice daily, hydrochlorothiazide 25 mg p.o. daily, Coreg 25 mg p.o. twice daily, Plavix 75 mg p.o. daily (this is a new medication), sucralfate 1 gram 4 times daily before meals and at bedtime, omeprazole 40 mg p.o. twice daily, Cepacol extra strength, sugar-free lozenges every 2 to 3 hours as needed, aspirin 81 mg p.o. once daily.   DIET: A 2 grams salt, low fat, low cholesterol, carbohydrate-controlled diet, regular consistency. The patient was advised to keep his head of bed elevated approximately 30 degrees at night to prevent reflux.  ACTIVITY LIMITATIONS: As tolerated.   FOLLOWUP APPOINTMENTS:  With Dr. Lucky Cowboy in 1 to 2 weeks after discharge; PCP at [place] in 2 days after discharge; Dr. Fletcher Anon, cardiology, in 2 to 3 days after discharge as well.   CONSULTANTS: Dr. Fletcher Anon, Dr. Lucky Cowboy, care management.   RADIOLOGIC STUDIES: Chest, portable, single view, 09 February 2013, showed no evidence of pneumonia. Minimal prominence of the interstitial markings were present, but stable, but there may be an underlying  reactive airway disease or COPD according to radiologist. CT scan of neck with contrast, 09 February 2013, revealed no acute disease of neck. Complex atherosclerotic plaque was in the carotid bulbs bilaterally resulting in greater than 75% stenosis of the proximal ICAs bilaterally. Vascular surgery consultation was recommended.   HISTORY: The patient is a 69 year old Caucasian male with past medical history significant for history of diabetes, hypertension, hyperlipidemia who presented to the hospital with complaints of throat discomfort, burning sensation in the chest, shortness of breath and difficulty swallowing and painful swallowing. Please refer to Dr. Lyman Speller admission note on 09 February 2013.    On arrival to the hospital, the patient's vitals were remarkable for blood pressure 170/80. He was afebrile with temperature of 98.3, pulse was 74, respiratory rate was 18, and pulse oximetry was 97% on room air. Physical exam was unremarkable.   The patient's lab data done in the Emergency Room, 27, August 2014, revealed mild elevation of creatinine to 1.39, glucose 145. Beta-type natriuretic peptide was 161. Sodium level was low at 135, otherwise BMP was unremarkable. The patient's hemoglobin A1c was checked and was found to be 6.8. Liver enzymes were normal. Cardiac enzymes x3 were within normal limits. White blood cell count was mildly elevated to 16.8, hemoglobin was 15.2, platelet count was 153. The patient's EKG showed normal sinus rhythm, ST-T wave abnormality,  consider lateral ischemia, prolonged QTc to 482 ms. Otherwise, no significant ST-T changes were noted. The patient's chest x-ray was unremarkable.  The patient was admitted to the hospital for further evaluation. His cardiac enzymes were cycled and because of his  throat concerns, pains, discomfort and difficulty swallowing, consultation with Dr. Pryor Ochoa was obtained. Dr. Pryor Ochoa saw the patient in consultation on 10 February 2013. He felt that the  patient's laryngoscopy revealed signs of reflux, but no other abnormality. Recommended to continue aggressive reflux control. Also vascular surgery to evaluate patient for the ICA stenosis, as well as possibly GI to evaluate patient for reflux, odynophagia if that continues. The patient was seen by Dr. Lucky Cowboy, vascular surgeon, who recommended a carotid stent placement but not open surgery at some point due to high risk of airway issues the patient may encounter postoperatively. He, however, did not feel that this needs to be done on this admission or in immediate future. Recommended to start the patient on Plavix at 75 mg p.o. and follow up with him in the office in the next 1 to 2 weeks after discharge to discuss timing of the procedures. He felt that this is essentially due to high grade stenosis. He is at risk of stroke if not treated medically alone, more than 5% per year. The patient was initiated on Plavix and he is being discharged to home on Plavix, as well as aspirin.  Due to his neck as well as chest discomfort, I consulted Dr. Fletcher Anon over the phone, and the patient was instructed to follow up with Dr. Fletcher Anon in his office in the next few days after discharge to possibly have an outpatient stress test done. After all procedures are performed, the patient would benefit from gastroenterology evaluation.  Meanwhile, we are increasing his antireflux therapy including sucralfate, as well as (Dictation Anomaly)higher doses of omeprazole. The patient was also given Cepacol lozenges to combat the discomfort in his throat.   In regards diabetes mellitus, the patient's diabetes was well controlled. Hemoglobin A1c was 6.8.   For malignant hypertension, the patient's blood pressure was somewhat elevated on arrival to the Emergency Room; however, the patient's blood pressure was better controlled when he calmed down. On day of discharge, the patient's blood pressure is ranging between 742V to 956L systolic and 87F to  64P diastolic. O2 saturations were 96% to 98% on room air at rest. His pulse ranged between 60s to 80s, and he was afebrile and temperature was 97.5.   In regards to renal insufficiency, the patient's creatinine was noted to be somewhat elevated on arrival to the hospital. It remained however, stable post IV (Dictation Anomaly) dye administration. On 11 February 2013, the patient's creatinine is 1.33. The patient was somewhat dehydrated and his sodium level was low. He was rehydrated with IV fluids and his sodium level normalized.   The patient is being discharged in stable condition with the above-mentioned medications and followup.   TIME SPENT: 40 minutes.  ____________________________ Theodoro Grist, MD rv:np D: 02/11/2013 18:33:00 ET T: 02/11/2013 22:52:35 ET JOB#: [id number]  cc: Theodoro Grist, MD, <Dictator> Muhammad A. Fletcher Anon, MD [facility] Algernon Huxley, MD   [place] MD ELECTRONICALLY SIGNED 03/14/2013 8:22

## 2014-10-06 NOTE — H&P (Signed)
PATIENT NAME:  Bryan Scott, Bryan Scott MR#:  [id number] DATE OF BIRTH:  21-Oct-1945  DATE OF ADMISSION:  07/08/2012  PRIMARY CARE PHYSICIAN: [facility]  CHIEF COMPLAINT: Chest pain.   HISTORY OF PRESENT ILLNESS: This is a 69 year old male who presents to the Emergency Room complaining of chest pain that woke him up from sleep this morning. The patient describes the chest pain as being sharp in nature, located in the center of his chest, radiating to his back. The chest pain is also associated with shortness of breath, some diaphoresis and nausea, but no vomiting. He also got a bit dizzy and lightheaded. The patient says that he has not had these symptoms before. He was just recently hospitalized about 2 weeks or so ago for angioedema secondary to lisinopril. He said he was doing well until this morning when he started developing the chest pain and became short of breath. He therefore called EMS and he was brought to the ER. In the Emergency Room, the patient was noted to be in acute new onset rapid atrial fibrillation. Hospitalist service was contacted for further treatment and evaluation.  REVIEW OF SYSTEMS: CONSTITUTIONAL: No documented fever. No weight gain. No weight loss.  EYES: No blurred or double vision.  ENT: No tinnitus. No postnasal drip. No redness of oropharynx.  RESPIRATORY: Positive cough. No wheeze. No hemoptysis. Positive dyspnea.  CARDIOVASCULAR: Positive chest pain. No orthopnea. No palpitations. No syncope.  GASTROINTESTINAL: Positive nausea. No vomiting. No diarrhea. No abdominal pain. No melena or hematochezia.  GENITOURINARY: No dysuria or hematuria.  ENDOCRINE: No polyuria or nocturia. No heat or cold intolerance.  HEME: No anemia. No bruising. No bleeding.  INTEGUMENTARY: No rashes. No lesions.  MUSCULOSKELETAL: No arthritis. No swelling. No gout.  NEUROLOGIC: No numbness or tingling. No ataxia. No seizure-type activity.  PSYCHIATRIC: No anxiety. No insomnia. No ADD.      PAST MEDICAL HISTORY: 1. Hypertension. 2. GERD. 3. Chronic pain syndrome. 4. Hyperlipidemia. 5. History of coronary artery disease status post CABG.  6. History of tobacco abuse.  ALLERGIES: HEPARIN AND LISINOPRIL. Lisinopril causes angioedema. Heparin supposedly causes increased risk of bleeding.   SOCIAL HISTORY: He does have a 30 pack per day smoking history, but quit about 8 years ago. No alcohol abuse. No illicit drug abuse. Lives by himself.   FAMILY HISTORY: Father died from complications of seizures. Mother died from a cardiac arrest.   CURRENT MEDICATIONS: 1. Amlodipine 10 mg daily. 2. Aspirin 81 mg daily. 3. Atorvastatin 40 mg at bedtime. 4. Senokot with Colace 1 tab 2 times a day. 5. Hydralazine 25 mg q. 8 hours.  6. Magnesium oxide 400 mg daily.  7. Morphine 30 mg 3 times daily as needed.  8. Omeprazole 20 mg 2 caps 2 times a day. 9. Zofran 8 mg q. 8 hours as needed. 10. Ranitidine 150 mg 2 times a day.  PHYSICAL EXAMINATION: VITALS: Temperature 97.8, pulse 111, respirations 30, blood pressure 103/61 and sats 95% on room air.  GENERAL: He is a pleasant but very anxious-appearing male in no apparent distress.  HEENT: Atraumatic, normocephalic. Extraocular muscles are intact. Pupils are equal and reactive to light. Sclerae anicteric. No conjunctival injection. No pharyngeal erythema.  NECK: Supple. No jugular venous distention. No bruits. No lymphadenopathy. No thyromegaly.  HEART: Tachycardic, irregular. No murmurs. No rubs. No clicks.  LUNGS: Clear to auscultation bilaterally. No rales. No rhonchi. No wheezes. Negative use of accessory muscles. ABDOMEN: Soft, flat, nontender and nondistended. Good bowel sounds.  No hepatosplenomegaly appreciated.  EXTREMITIES: No evidence of any cyanosis, clubbing or peripheral edema. Has +2 pedal and radial pulses bilaterally.  NEUROLOGICAL: Alert, awake and oriented x 3 with no focal motor or sensory deficits appreciated  bilaterally.  SKIN: Moist and warm with no rashes appreciated. LYMPHATIC: No cervical or axillary lymphadenopathy.   LABS/RADIOLOGIC STUDIES: Serum glucose 195, BUN 20, creatinine 1.5, sodium 127, potassium 3.7, chloride 96 and bicarbonate 19. LFTS: AST 95, ALT 111 and albumin 2.5. Troponin 0.03. White cell count 5, hemoglobin 14, hematocrit 39.9 and platelet count 139. INR is 1.1. D-dimer is mildly elevated at 1.67.   Chest x-ray: Left lower lobe atelectasis with cardiomegaly and changes consistent with previous coronary artery bypass graft surgery.   ASSESSMENT AND PLAN: This is a 69 year old male with a history of hypertension, gastroesophageal reflux disease, chronic pain syndrome, hyperlipidemia, history of tobacco abuse and history of coronary artery disease status post coronary artery bypass graft who presents to the hospital with chest pain and noted to be in new onset atrial fibrillation.  1. New onset atrial fibrillation. The patient's rates are presently fairly relatively controlled in the low 100s to one teens. He does not complain of palpitations. For now I will start him on some low dose metoprolol for rate control, also give him p.r.n. IV Cardizem if needed and place him on telemetry. His CHADS-2 VASc score is 3. He likely needs to be on anticoagulation, but has an allergy to heparin; therefore, we can consider possible Pradaxa or Xarelto, but will  await further cardiology input. I discussed the case with Dr. Fletcher Anon who will see the patient. I will also get a two-dimensional echocardiogram.  2. Chest pain. The patient does have significant risk factors given history of tobacco abuse and previous history of coronary artery bypass graft. I will keep him on telemetry, follow serial cardiac markers; the first set is negative. Continue aspirin, beta blocker, statin, morphine, nitroglycerin and oxygen supplementation. We will get a cardiology consult and discuss with Dr. Fletcher Anon. We will also get a  two-dimensional echocardiogram.  3. Chronic pain syndrome. Continue with p.r.n. morphine for pain.  4. Gastroesophageal reflux disease. Continue Prilosec.  5. Hyperlipidemia. Continue atorvastatin.  6. Acute renal failure. This is possibly related to dehydration. I will gently hydrate with IV fluids and follow his BUN and creatinine, renal dose medications and avoid nephrotoxins.  7. Hyponatremia. This is probably related to hypotonic hypovolemic hyponatremia secondary to volume depletion. I will give him IV fluids, follow his sodium. 8. Abnormal liver function tests. We will follow his LFTs and get a limited abdominal ultrasound.  9. Elevated d-dimer. We will get a V/Q scan and also Doppler of his lower extremities.   CODE STATUS: FULL CODE.   TIME SPENT ON ADMISSION: 50 minutes. ____________________________ Belia Heman. Verdell Carmine, MD vjs:sb D: 07/08/2012 10:36:41 ET T: 07/08/2012 10:51:02 ET JOB#: [id number]  cc: Belia Heman. Verdell Carmine, MD, <Dictator> Henreitta Leber MD ELECTRONICALLY SIGNED 07/09/2012 11:12

## 2014-10-06 NOTE — Consult Note (Signed)
Chief Complaint:  Subjective/Chief Complaint Pt still having chest discomfort. He has had recurrent GERD. Not better with carafate.   VITAL SIGNS/ANCILLARY NOTES: **Vital Signs.:   03-Sep-14 08:48  Vital Signs Type Routine  Temperature Temperature (F) 97.9  Celsius 36.6  Temperature Source oral  Pulse Pulse 57  Systolic BP Systolic BP 967  Diastolic BP (mmHg) Diastolic BP (mmHg) 75  Mean BP 96  Pulse Ox % Pulse Ox % 95  Pulse Ox Activity Level  At rest  Oxygen Delivery Room Air/ 21 %  *Intake and Output.:   Daily 03-Sep-14 07:00  Grand Totals Intake:  500 Output:  900    Net:  -400 24 Hr.:  -400  IV (Primary)      In:  500  Urine ml     Out:  900  Length of Stay Totals Intake:  500 Output:  900    Net:  -400   Brief Assessment:  GEN well developed, well nourished, no acute distress   Cardiac Regular  murmur present  -- LE edema  --Gallop   Respiratory normal resp effort  clear BS  no use of accessory muscles   Gastrointestinal Normal   Gastrointestinal details normal Soft  Nontender  Nondistended  No masses palpable   EXTR negative cyanosis/clubbing, negative edema   Lab Results: Hepatic:  02-Sep-14 11:23   Bilirubin, Total  1.2  Alkaline Phosphatase 94  SGPT (ALT) 32  SGOT (AST) 23  Total Protein, Serum 7.3  Albumin, Serum  3.3  Cardiology:  02-Sep-14 11:16   Ventricular Rate 59  Atrial Rate 59  P-R Interval 172  QRS Duration 94  QT 446  QTc 441  P Axis 24  R Axis -3  T Axis 104  ECG interpretation Sinus bradycardia Inferior infarct , age undetermined ST & T wave abnormality, consider lateral ischemia Abnormal ECG When compared with ECG of 15-Feb-2013 11:15, Inferior infarct is now Present ----------unconfirmed---------- Confirmed by OVERREAD, NOT ([id number]), editor PEARSON, BARBARA ([id number]) on 02/16/2013 9:59:59 AM  Routine Chem:  02-Sep-14 11:23   Glucose, Serum  140  BUN 16  Creatinine (comp)  1.39  Sodium, Serum  134  Potassium, Serum 3.8   Chloride, Serum 101  CO2, Serum 26  Calcium (Total), Serum 9.0  Anion Gap 7  Osmolality (calc) 272  eGFR (African American) >60  eGFR (Non-African American)  52 (eGFR values <37m/min/1.73 m2 may be an indication of chronic kidney disease (CKD). Calculated eGFR is useful in patients with stable renal function. The eGFR calculation will not be reliable in acutely ill patients when serum creatinine is changing rapidly. It is not useful in  patients on dialysis. The eGFR calculation may not be applicable to patients at the low and high extremes of body sizes, pregnant women, and vegetarians.)  03-Sep-14 04:51   Glucose, Serum  175  BUN 13  Creatinine (comp) 1.17  Sodium, Serum  134  Potassium, Serum  3.2  Chloride, Serum 100  CO2, Serum 26  Calcium (Total), Serum 9.2  Anion Gap 8  Osmolality (calc) 273  eGFR (African American) >60  eGFR (Non-African American) >60 (eGFR values <629mmin/1.73 m2 may be an indication of chronic kidney disease (CKD). Calculated eGFR is useful in patients with stable renal function. The eGFR calculation will not be reliable in acutely ill patients when serum creatinine is changing rapidly. It is not useful in  patients on dialysis. The eGFR calculation may not be applicable to patients at the low and high extremes  of body sizes, pregnant women, and vegetarians.)  Cardiac:  02-Sep-14 11:23   CK, Total 103  CPK-MB, Serum 1.3 (Result(s) reported on 15 Feb 2013 at 11:49AM.)  Troponin I < 0.02 (0.00-0.05 0.05 ng/mL or less: NEGATIVE  Repeat testing in 3-6 hrs  if clinically indicated. >0.05 ng/mL: POTENTIAL  MYOCARDIAL INJURY. Repeat  testing in 3-6 hrs if  clinically indicated. NOTE: An increase or decrease  of 30% or more on serial  testing suggests a  clinically important change)  Routine Hem:  02-Sep-14 11:23   WBC (CBC)  11.5  RBC (CBC) 4.48  Hemoglobin (CBC) 14.7  Hematocrit (CBC) 41.4  Platelet Count (CBC) 203 (Result(s) reported  on 15 Feb 2013 at 11:37AM.)  MCV 92  MCH 32.9  MCHC 35.6  RDW 13.2  03-Sep-14 04:51   WBC (CBC)  10.9  RBC (CBC)  4.36  Hemoglobin (CBC) 14.3  Hematocrit (CBC) 40.6  Platelet Count (CBC) 178  MCV 93  MCH 32.9  MCHC 35.4  RDW 13.6  Neutrophil % 67.5  Lymphocyte % 24.2  Monocyte % 6.6  Eosinophil % 1.4  Basophil % 0.3  Neutrophil #  7.3  Lymphocyte # 2.6  Monocyte # 0.7  Eosinophil # 0.2  Basophil # 0.0 (Result(s) reported on 16 Feb 2013 at 05:25AM.)   Radiology Results: XRay:    02-Sep-14 13:04, Chest Portable Single View  Chest Portable Single View   REASON FOR EXAM:    Chest pain  COMMENTS:       PROCEDURE: DXR - DXR PORTABLE CHEST SINGLE VIEW  - Feb 15 2013  1:04PM     RESULT: Comparison made to prior study Nov 09, 2012. Prior CABG. Prior   cervical spine fusion. Lungs are clear of infiltrates.Heart size is   slightly  prominent, no CHF.    IMPRESSION:  No acute abnormality. Prior CABG. Myocardium megaly. No CHF.        Verified By: Osa Craver, M.D., MD  Cardiology:    02-Sep-14 11:16, ED ECG  Ventricular Rate 59  Atrial Rate 59  P-R Interval 172  QRS Duration 94  QT 446  QTc 441  P Axis 24  R Axis -3  T Axis 104  ECG interpretation   Sinus bradycardia  Inferior infarct , age undetermined  ST & T wave abnormality, consider lateral ischemia  Abnormal ECG  When compared with ECG of 15-Feb-2013 11:15,  Inferior infarct is now Present  ----------unconfirmed----------  Confirmed by OVERREAD, NOT ([id number]), editor PEARSON, BARBARA ([id number]) on 02/16/2013 9:59:59 AM  ED ECG    Assessment/Plan:  Assessment/Plan:  Assessment IMP CP CAD GERD HTN Obesity S/P cath with no IRA CRI Hyperlipidemia .   Plan PLAN ASA 81 mg daily Statin therapy Continue reflux therapy I do not recommend further cardiac w/u Continue tele Consider nephrology for CRI Mild weight loss and exerise Consider Imdur  for angina if symptoms persist Consider GI evaluation    Electronic Signatures: Yolonda Kida (MD)  (Signed 03-Sep-14 12:49)  Authored: Chief Complaint, VITAL SIGNS/ANCILLARY NOTES, Brief Assessment, Lab Results, Radiology Results, Assessment/Plan   Last Updated: 03-Sep-14 12:49 by Yolonda Kida (MD)

## 2014-10-06 NOTE — Op Note (Signed)
PATIENT NAME:  Bryan Scott, MARLETT MR#:  [id number] DATE OF BIRTH:  07-18-1945  DATE OF PROCEDURE:  03/28/2013  PREOPERATIVE DIAGNOSES: 1.  Bilateral high-grade carotid artery stenosis.  2.  History of neck swelling and possible angioedema with need for intubation previously.  3.  Status post cervical fusion with immobility of the neck.  4.  Bladder cancer.  5.  Hypertension.  6.  Coronary artery disease.   POSTOPERATIVE DIAGNOSES:  1.  Bilateral high-grade carotid artery stenosis.  2.  History of neck swelling and possible angioedema with need for intubation previously.  3.  Status post cervical fusion with immobility of the neck.  4.  Bladder cancer.  5.  Hypertension.  6.  Coronary artery disease.   PROCEDURES: 1.  Ultrasound guidance for vascular access, right femoral artery.  2.  Catheter placement in the left internal carotid artery from right femoral approach.  3.  Thoracic aortogram and cervical cerebral carotid arteriograms.  4.  Placement of a 9-7 tapered 4 cm long Xact carotid artery stent with the use of the NAV6 embolic protection device, left carotid artery.  5.  StarClose closure device, right femoral artery.   SURGEON: Algernon Huxley, M.D.   ANESTHESIA: Local with moderate conscious sedation.   BLOOD LOSS: 50 mL.  FLUOROSCOPY TIME: 12 minutes.   CONTRAST USED: 90 mL.  INDICATION FOR PROCEDURE: A 69 year old white male who I saw as a consult about a month ago in the hospital. He had been admitted with neck swelling and was found to have bilateral carotid artery stenosis on a CT scan performed for that reason. Due to his history of neck swelling and difficult airway issues as well as his neck immobility from cervical fusion, open endarterectomy was not recommended. He did have anatomy that appeared as if it would be amenable to percutaneous therapy and carotid artery stenting was discussed. Risks and benefits of the procedure were discussed and informed consent was obtained.    DESCRIPTION OF PROCEDURE: The patient is brought to the vascular and interventional radiology suite. Groins were shaved and prepped and a sterile surgical field was created. Ultrasound was used to visualize a patent right femoral artery. It was then accessed under direct ultrasound guidance without difficulty with a Seldinger needle. A J-wire and 6-French sheath were placed. Pigtail catheter was placed at the ascending aorta and an aortogram was performed. This showed normal origins of the great vessels, although there was a significant reverse curve making cannulation of the left carotid artery challenging. Initially I used a JB-2 catheter, but this would not advance so I exchanged for a Bentson-Hanafee catheter and was able to advance this into the common carotid artery. Imaging was performed showing an approximately 80% stenosis at the origin of the internal carotid artery and a second stenosis about a centimeter downstream that was in the 50% range. The patient has a reported allergy to heparin so he was given Angiomax during the procedure. The Amplatz Super Stiff wire was placed after advancing the catheter into the external carotid artery and a 6-French shuttle sheath was then placed. Intracerebral imaging demonstrated normal filling of the anterior and middle cerebral artery on the left, and I then used the larger NAV6 embolic protection device to cross the lesion without difficulty. I parked the protection device in the distal internal carotid artery. I then selected a 9-6 tapered 4 cm long Xact stent. I crossed the lesion with the stent and performed final imaging through the sheath. The stent  was then deployed encompassing the lesion in an excellent location. The patient had a starting heart rate of around 60 and 2 doses of atropine were given. He did not have any significant hypotension. His heart rate got into the 40s briefly, but quickly returned. At this point, completion imaging was performed  which showed approximately 15% residual stenosis which was certainly not flow limiting and I elected to terminate the procedure. The sheath was pulled back. Oblique arteriogram was performed of the femoral artery and StarClose closure device was deployed in the usual fashion with excellent hemostatic result. The patient tolerated the procedure well and was taken to the recovery room in stable condition.  ____________________________ Algernon Huxley, MD jsd:sb D: 03/28/2013 14:57:36 ET T: 03/28/2013 15:44:57 ET JOB#: [id number]  cc: Algernon Huxley, MD, <Dictator> Algernon Huxley MD ELECTRONICALLY SIGNED 04/06/2013 10:25

## 2014-10-06 NOTE — Consult Note (Signed)
Brief Consult Note: Diagnosis: Odynophagia.   Patient was seen by consultant.   Consult note dictated.   Recommend further assessment or treatment.   Comments: H/o angioedema as well as odynophagia in past.  CT scan negative.  Patient reports bilateral neck pain laterally in area of his SCM/lateral spine.  +Nausea.  +reflux and heartburn.  Laryngoscopy- no abnormality of upper airway, larynx clear, +post-cricoid edema c/w reflux  Impression:  Odynophagia and neck pain 1)  CT scan revealed no evidence of infection, but did show carotid artery occlusion.  Laryngoscopy revealed signs of reflux but no other abnormality.  Rec. aggressive reflux control.  Vascular surgery eval. to see if they feel the carotids are any role here.  GI medicine evaluate to evaluate reflux/odynophagia if continues.  Electronic Signatures: Alanmichael Barmore, Shela Leff (MD)  (Signed 28-Aug-14 12:37)  Authored: Brief Consult Note   Last Updated: 28-Aug-14 12:37 by Pascal Lux (MD)

## 2014-10-06 NOTE — H&P (Signed)
PATIENT NAME:  Bryan Scott, SHUGHART MR#:  [id number] DATE OF BIRTH:  [birth year]  DATE OF ADMISSION:  02/15/2013  REFERRING PHYSICIAN: Dr. Clayborn Bigness.   PRIMARY CARE PHYSICIAN: Nonlocal. The patient has been referred to see Dr. Lucky Cowboy, vascular surgeon, for carotid stenosis.   HISTORY OF PRESENT ILLNESS: This is a 69 year old gentleman who has history of hypertension, coronary artery disease, who had a coronary artery bypass graft in the past at [facility]. He has history of being a former smoker, hyperlipidemia and severe GERD. 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 The patient was recently admitted here on 02/09/2013 with the chief complaint of chest pain. He was ruled out and he was found to have carotid artery stenosis. He also was found to have some relief of his symptomatology with GI cocktails. The patient was discharged in good condition. He was referred to see Dr. Lucky Cowboy, who is going to possibly do a carotid endarterectomy in the near future within the next month. The patient is scheduled to have a stress test tomorrow with Dr. Mabeline Caras ,  but today he had a cardiac cath. Overall, the patient comes with a history of chest pain that started at 9:30 a.m. The patient was feeling really weak and he could not walk at the same time. He felt sick to his stomach and started sweating profusely. The pain again started at 9:30. He took 2 nitroglycerin and it did not go away. He was given 3 more nitroglycerin here in the hospital and it did not go away. The only thing that made it better was Dilaudid after the heart cath. The pain started to be in the middle of the chest, no radiation, very strong, 10 out of 10, sharp at some points but also feeling like pressure, like something sitting on his chest. He felt dizzy whenever he was having the chest pain. He was not doing any physical activity. He was at rest. The patient got Dilaudid and the pain improved. The  patient was taken to heart cath by Dr. Clayborn Bigness. By verbal report, Dr. Clayborn Bigness did not find any significant acute blockages and his troponin was negative. He took him to heart cath this morning and now he was going to try to discharge him home, but the patient was continuing to have pain, for which he recommended to be watched overnight with cardiac enzymes. Overall, the patient has been doing okay after the heart cath. The pain continued as mentioned above, but it resolved with some Dilaudid as pain medication.   REVIEW OF SYSTEMS:   CONSTITUTIONAL: Denies any fever or fatigue. Positive weakness. No weight loss or weight gains.  EYES: No double vision, blurry vision or swelling of the eyes.  EARS, NOSE, THROAT: No tinnitus. No difficulty hearing. No difficulty swallowing.  RESPIRATORY: No cough, wheezing, hemoptysis or COPD. No painful respirations.  CARDIOVASCULAR: Positive chest pain as mentioned above. No orthopnea, no edema, no arrhythmias, no syncope.  GASTROINTESTINAL: Positive nausea with the chest pain. No vomiting. No diarrhea. No abdominal pain. No hematemesis. No jaundice.  GENITOURINARY: No dysuria, hematuria, changes in frequency. No prostatitis. HEMATOLOGIC AND LYMPHATIC: No anemia, easy bruising or swollen glands.  SKIN: No rashes, petechiae or new lesions.  MUSCULOSKELETAL: Positive neck pain, back pain, occasional joint pain on the lower extremities. The patient has chronic pain syndrome. Has disk disease, multilevel.  NEUROLOGIC: No numbness, tingling. No CVAs or TIAs.  PSYCHIATRIC: Positive for mild insomnia. No depression.  No anxiety. VASCULAR: Positive carotid artery stenosis.   PAST MEDICAL HISTORY: 1.  Hypertension.  2.  Coronary artery disease.  3.  Chronic pain syndrome.  4.  Disk disease, multilevel. Chronic back pain, chronic neck pain.  5.  Hyperlipidemia.  6.  GERD.   PAST SURGICAL HISTORY: 1.  Positive for back surgeries, 1 hip surgery to remove bone for  laminectomy. 2.  CABG. 3.  Two neck surgeries.   ALLERGIES: HEPARIN, LISINOPRIL.   SOCIAL HISTORY: The patient is a former smoker. He smoked for over 30 years more than 1 pack a day, but he quit about 9 years ago. No current alcohol. No drug use. The patient lives by himself. He is retired.   FAMILY HISTORY: Father died from seizure disorder. Mom died from cardiac arrest.   CURRENT MEDICATIONS: Sucralfate 1 gram 3 times a day, ranitidine 150 mg twice daily, Plavix 75 mg daily, Zofran 8 mg take 1/2 tablet every 8 hours as needed for nausea, omeprazole 40 mg twice daily, nitroglycerin as needed for chest pain, metformin 1000 mg twice daily, magnesium oxide 400 mg 2 times a day, hydrochlorothiazide 25 mg once daily, Coreg 25 mg twice daily, atorvastatin 80 mg once daily, Norco as needed for pain.   PHYSICAL EXAMINATION: VITAL SIGNS: Blood pressure 140/73, pulse in the 80s, respirations 18 to 20, temperature afebrile.  GENERAL: The patient is alert, oriented x 3, not in acute distress. No respiratory distress. Hemodynamically stable.  HEENT: Pupils are equal and reactive. Extraocular movements are intact. Mucosae are moist. Anicteric sclerae. Pink conjunctivae. No oral lesions. No oropharyngeal exudates.  NECK: Supple. No JVD. No thyromegaly. No adenopathy. I cannot hear any carotid bruits at this moment. No rigidity.  CARDIOVASCULAR: Regular rate and rhythm. No murmurs, rubs or gallops are appreciated. No arrhythmias. No displacement of PMI. Positive reproducible tenderness to palpation of the chest. Chest is actually very sore.  LUNGS: Clear, without any wheezing or crepitus. No use of accessory muscles.  ABDOMEN: Soft, nontender, nondistended. No hepatosplenomegaly. No masses. Bowel sounds are positive.  EXTREMITIES: No edema, cyanosis or clubbing. Pulses +2 all 4 extremities. No difference in the quality or strength of the pulses. Sensation is normal in all 4 extremities.  VASCULAR: Capillary  refill less than 3 seconds.  MUSCULOSKELETAL: No significant joint abnormalities or swelling. Tenderness to palpation of neck, which is chronic. No deformity of the C-spine or L-spine.  SKIN: No rashes, petechiae or new lesions.  LYMPHATIC: Negative for lymphadenopathy in neck or supraclavicular areas.  NEUROLOGIC: Cranial nerves II through XII intact. Strength is 5/5 in all 4 extremities.  PSYCHIATRIC: Mood is normal without any significant agitation. The patient is alert, oriented x 3.   LABORATORY, DIAGNOSTIC AND RADIOLOGICAL DATA: Sodium 1, creatinine 1.39, potassium. GFR is around 50. Bilirubin is 1.2. Albumin is 3.3. Cardiac enzymes negative x 1. White count is 11.5 with a hemoglobin of 14.7.   EKG: J-point increased at level of leads II and aVF. There are some inversion at the level of I and aVL, which is likely due to mild LVH.   Chest x-ray: No acute abnormalities.   ASSESSMENT AND PLAN: 1.  Chest pain: This chest pain looks very atypical. It seems very musculoskeletal. We are going to give him some stronger pain medications instead of Norco. We are going to change to Percocet. I do not think that we need to give him a lot of intravenous medications like Dilaudid or morphine at this moment, but we are  going to monitor his blood levels of troponin, CK-MB. At this moment, his first set of cardiac enzymes are negative and he had a negative heart catheterization. I do not have a full report. I got the verbal report from Dr. Clayborn Bigness, who told me that he did not have acute ST elevation myocardial infarction. I could not get any further details on that report yet. The patient is on a beta blocker, Coreg, and a statin. We will monitor his vital signs, monitor his pulse and blood pressure for any specific changes, but for now it seems like the patient has pain which is musculoskeletal. If the patient's pain is better this afternoon, we can discharge him home; if not, tomorrow morning after being ruled  out with cardiac enzymes. The patient feels comfortable going home this afternoon if possible.  2.  Coronary artery disease: Continue aspirin, beta blocker and statin.  3.  Hypertension: Continue use of home medications, beta blocker and hydrochlorothiazide. Watch sodium, as it is slightly decreased at 134.  4.  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 No changes in medications at this moment.  5.  Hyperlipidemia: Continue use of statin.  6.  History of former tobacco abuse: The patient has quit 9 years ago.  7.  Gastrointestinal: The patient is on gastrointestinal prophylaxis with proton pump inhibitor twice daily. He is also on Carafate. At this moment, part of the differential diagnosis of this chest pain is gastrointestinal related, for what we are going to continue these medications and follow up with gastroenterology as outpatient.  8.  Deep vein thrombosis prophylaxis: THE PATIENT IS ALLERGIC TO HEPARIN. We are just going to monitor closely. The patient will be ambulatory. He already received some other medications with the heart catheterization.   TIME SPENT: I spent about 45 minutes with this patient.   CODE STATUS: He is full code.    ____________________________ Hanover Sink, MD rsg:jm D: 02/15/2013 13:28:05 ET T: 02/15/2013 13:59:52 ET JOB#: [id number]  cc: Brushton Sink, MD, <Dictator> Corben Auzenne America Brown MD ELECTRONICALLY SIGNED 02/25/2013 3:35

## 2014-10-06 NOTE — Op Note (Signed)
PATIENT NAME:  Bryan Scott, ARAVE MR#:  [id number] DATE OF BIRTH:  1946-04-06  DATE OF PROCEDURE:  05/02/2013  PREOPERATIVE DIAGNOSES:  1.  Bilateral high-grade carotid artery stenosis.  2.  Angioedema with previous history of neck swelling requiring intubation.  3.  Severe neck immobility due to previous neck surgery.  4.  Diabetes.  5.  Hyperlipidemia. 6.  Coronary artery disease. 7.  History of bladder cancer.   POSTOPERATIVE DIAGNOSES: 1.  Bilateral high-grade carotid artery stenosis.  2.  Angioedema with previous history of neck swelling requiring intubation.  3.  Severe neck immobility due to previous neck surgery.  4.  Diabetes.  5.  Hyperlipidemia.  6.  Coronary artery disease.  7.  History of bladder cancer.   PROCEDURE:  1.  Ultrasound guidance for vascular access, right femoral artery.  2.  Catheter placement to right internal carotid artery from right femoral approach.  3.  Thoracic aortogram and selective cervical and cerebral right carotid arteriogram.  4.  Placement of a 9/7 tapered Xact stent with the use of the Emboshield NAV6 embolic protection device, right carotid artery.  5.  StarClose closure device, right femoral artery.   SURGEON: Algernon Huxley, M.D.   ANESTHESIA: Local with moderate conscious sedation.   ESTIMATED BLOOD LOSS: Approximately 50 mL.  FLUOROSCOPY TIME: 7 minutes.   CONTRAST USED: 105 mL of Visipaque.   INDICATION FOR PROCEDURE: This is a 69 year old white male, who has a history of neck swelling and problems with his neck. An incidental finding on the CT scan several months ago was a bilateral high-grade carotid artery stenosis. Due to his neck swelling and angioedema requiring previous intubation as well as severe neck immobility, operative therapy on his neck was not felt to be safe. Carotid artery stenting is planned in a staged fashion. He has already had his left carotid artery repaired and is back for repair of his right carotid artery with  carotid artery stenting. Risks and benefits were discussed. Informed consent was obtained.   DESCRIPTION OF PROCEDURE: The patient is brought to the vascular and interventional radiology suite. Groins were shaved and prepped and a sterile surgical field was created. The right femoral head was localized with fluoroscopy. The right femoral artery was visualized with ultrasound and found to be widely patent. It was then accessed under direct ultrasound guidance without difficulty with a Seldinger needle and a permanent quarter of a J-wire and 6-French sheath were placed. Pigtail catheter was placed in the ascending aorta. This showed no stenosis in the proximal portion of the great vessels. He had previously shown aortic configuration with a bovine or near bovine arch wincing this on his previous imaging. I then selected an H1 catheter and selectively cannulated the innominate artery and advanced into the right common carotid artery. Selective imaging was then performed. This showed about an 80% stenosis in the proximal internal carotid artery. I then cannulated the external carotid artery and advanced a catheter well into the external carotid artery. Puff contrast was used to confirm this and then we placed the Amplatz Super Stiff wire with a 1 cm floppy tip into the external carotid artery removing the diagnostic catheter. The 6-French shuttle sheath was then placed over the wire. The patient had been systemically anticoagulated with Angiomax, so an ACT was not performed.   Imaging was performed in an oblique and lateral projection of the neck and lateral and AP projection from the cerebral imaging. No significant anterior cerebral flow was seen  on the initial injections in the intracerebral portion and again about an 80% stenosis seen in the proximal internal carotid artery. I was able to cross the lesion with the large embolic protection device, which was the NAV6 embolic protection device and deployed this in  the distal main internal carotid artery. A 9/7, 4 cm long tapered stent was selected, used to cross the lesion, imaging was performed and the stent was deployed encompassing the lesion in the internal carotid artery with its proximal portion back into the common carotid artery. This was post dilated with 5 mm balloon with 0.4 mg of atropine given preemptively with balloon inflation. We did see some mild reduction in his heart rate from 70 to the 40s, which slowly improved. With this his pressure dipped, but also improved with fluid bolus and atropine. The post procedure images were performed. This demonstrated less than 20% residual stenosis in the carotid artery after stent placement. The intracerebral view showed a little better anterior cerebral flow, although it was still not brisk and normal middle cerebral flow. At this point, I elected to terminate the procedure. The sheath was pulled back to the ipsilateral external iliac artery and oblique arteriogram was performed. StarClose closure device was deployed in the usual fashion with excellent hemostatic result. The patient tolerated the procedure well and was taken to the recovery room in stable condition.  ____________________________ Algernon Huxley, MD jsd:aw D: 05/02/2013 10:05:56 ET T: 05/02/2013 10:40:25 ET JOB#: [id number]  cc: Algernon Huxley, MD, <Dictator> Algernon Huxley MD ELECTRONICALLY SIGNED 05/04/2013 8:42

## 2014-10-07 NOTE — Consult Note (Signed)
PATIENT NAME:  Bryan Scott, FORGET MR#:  [id number] DATE OF BIRTH:  1945-09-18  DATE OF CONSULTATION:  10/22/2013  CONSULTING PHYSICIAN:  Corey Skains, MD  CONSULTING PHYSICIAN: Dr. Robyne Askew.   REASON FOR CONSULTATION: Acute unstable angina, coronary disease, and sepsis.   CHIEF COMPLAINT: "I'm short of breath."   HISTORY OF PRESENT ILLNESS: This is a 69 year old male with known coronary artery disease status post coronary artery bypass graft, hypertension, hyperlipidemia with recent hospitalization for an acute pneumonia, hypoxia, and shortness of breath. The patient was discharged home but has new onset substernal chest discomfort, chest pain to his back and arm with severe shortness of breath. At this time, the patient has had an EKG showing normal sinus rhythm with nonspecific ST changes and elevated troponin of 1.1 consistent with possible demand ischemia. He also has chronic kidney disease and significant worsening sepsis-type symptoms as well. Hypertension has been stable. The patient has had weakness and fatigue with this is well.   REVIEW OF SYSTEMS: The remainder of review of systems negative for vision change, ringing in the ears, hearing loss, heartburn, nausea, vomiting, diarrhea, bloody stools, stomach pain, extremity pain, leg weakness, cramping of the buttocks, known blood clots, headaches, blackouts, dizzy spells, nosebleeds, frequent urination, urination at night, muscle weakness, numbness, anxiety, depression, skin lesions, or skin rashes.   PAST MEDICAL HISTORY:  1. Hypertension.  2. Coronary artery disease status post coronary artery bypass graft. 3. Pneumonia and COPD.  4. Chronic kidney disease.   FAMILY HISTORY: No family members with early onset of cardiovascular disease.   SOCIAL HISTORY: He has remote tobacco use. Currently denies alcohol or tobacco use.   ALLERGIES: AS LISTED.   MEDICATIONS: As listed.   PHYSICAL EXAMINATION:  VITAL SIGNS: Blood pressure is  122/68 bilaterally, heart rate is 78 upright, reclining, and regular.  GENERAL: He is a well-appearing male in no acute distress.  HEENT: No icterus, thyromegaly, ulcers, hemorrhage, or xanthelasma.  CARDIOVASCULAR: Regular rate and rhythm. Normal S1 and S2. PMI is diffuse. Carotid upstroke normal without bruit. Jugular venous pressure is normal.  LUNGS: Decreased breath sounds, diffuse wheezes, and crackles.  ABDOMEN: Soft, nontender, increased abdominal girth. Cannot assess hepatosplenomegaly.  EXTREMITIES: Shows 2+ radial, trace femoral and dorsal pedal pulses, 1+ lower extremity edema. No cyanosis, clubbing, or ulcers.  NEUROLOGIC: He is oriented to time, place, and person, with normal mood and affect.   ASSESSMENT: A 69 year old male with acute onset of unstable angina, hypertension, coronary artery disease, sepsis with chronic kidney disease, elevated troponin possibly consistent with acute coronary syndrome and/or non-ST segment elevation myocardial infarction. Marland Kitchen   RECOMMENDATIONS:  1. Continue serial ECG and enzymes to assess for myocardial infarction.  2. Continue hydration for elevated creatinine and sepsis-type concerns.  3. Continue beta blocker for heart rate control and myocardial infarction.  4. Echocardiogram for LV systolic dysfunction, valvular heart disease for further evaluation and treatment options.  5. Continue care for acute myocardial infarction and/or sepsis, which may have exacerbated the above.   6. Further treatment options after above.   ____________________________ Corey Skains, MD bjk:lt D: 10/23/2013 05:59:21 ET T: 10/23/2013 07:43:09 ET JOB#: [id number]  cc: Corey Skains, MD, <Dictator> Corey Skains MD ELECTRONICALLY SIGNED 10/24/2013 8:50

## 2014-10-07 NOTE — Discharge Summary (Signed)
PATIENT NAME:  Bryan Scott, Bryan Scott MR#:  [id number] DATE OF BIRTH:  26-Dec-1945  DATE OF ADMISSION:  10/23/2013 DATE OF DISCHARGE:  10/25/2013  REASON FOR ADMISSION: Chest pain, shortness of breath.   DISCHARGE DIAGNOSES: 1.  Chest pain secondary to non-ST elevation myocardial infarction with subendocardial ischemia.  2.  Hypertension.  3.  History of coronary artery disease.  4.  Diabetes.  5.  Hyponatremia.  6.  Healthcare-acquired pneumonia.  7.  Sepsis with evidence of tachycardia and increased white blood cells secondary to pneumonia. 8.  Hyperlipidemia, on statin.   DISPOSITION: Home.   MEDICATIONS AT DISCHARGE: Atorvastatin 80 mg once a day, magnesium oxide 400 mg twice daily, nitroglycerin 0.4 mg as needed for chest pain, metformin 1000 mg twice daily, Coreg 25 mg 2 times a day, Plavix 75 mg once daily, MS Contin 30 mg every 12 hours, Zolpidem 5 mg once hat night for sleep, albuterol 90 mcg 2 puffs 3 times a day, hydrochlorothiazide 25 mg once a day, ranitidine 150 mg twice daily, docusate sodium 50 mg twice daily, Protonix 40 mg once a day, Zofran 8 mg every 8 hours, aspirin 81 mg daily, Spiriva 18 mcg once a day, Coreg 120 mg once a day, Levaquin 750 mg once a day. Levaquin to be continued for a total of 10 days.   FOLLOWUP: Primary care physician in 10 to 14 days. Cardiology, Dr. Nehemiah Massed, 10 days to 14 days.   HOSPITAL COURSE:  A very nice 69 year old gentleman with history of coronary artery disease, status post CABG, hyperlipidemia, was admitted to the hospital back in May of 2015 and he was treated for pneumonia. He presented to the Emergency Department on 10/22/2013 with a chief complaint of diaphoresis. The patient had significant chest pain that started at midnight while sitting down watching TV, radiating to left arm, associated with significant sweating. The patient was tachycardic at 113, sinus rhythm, received 1 liter bolus with some improvement. Cardizem IV was given to improve  the heart rate. He had significant pain that was characterized as pleuritic for which CT angio of the chest was done to rule out pulmonary embolism. He did not have any pulmonary embolism but he had persistent pneumonia. The patient was admitted for IV antibiotics and continuous monitoring. The patient had criteria for sepsis with tachycardia, tachypnea and increased white blood cells. His white blood cells on admission were 12.3. The patient had criteria of sepsis on admission.  AS FAR AS PROBLEMS GO:  1.  Sepsis that was secondary to pneumonia. The patient was treated with Zosyn, vancomycin and Levaquin. His antibiotics were downgraded after some improvement. This was to cover healthcare-acquired pneumonia since the patient was previously hospitalized. The patient had significant improvement. Vancomycin was stopped, Zosyn was stopped and now he is going to be discharged on Levaquin alone. The patient is to continue a course of 10 more days of antibiotics. As far as pneumonia goes, the patient needs to have followup as this has not resolved completely, recommend followup x-rays in the next couple weeks resolution. Since the patient is a former smoker, we are going to make sure that he is not having occult lesion.  2.  The patient also developed significant elevation of his troponin. The first set of troponin on admission was 0.02 and the second set  increased to 0.67, third set to 1.1, after that started to decline. This was evidence of non-ST elevation myocardial infarction due to subendocardial ischemia. Likely this was a type 2  non-ST elevation myocardial infarction due to demand ischemia which was brought on by the sepsis, pneumonia and tachycardia. Since the patient was still tachycardic, he was started back on Coreg in addition of diltiazem which was added on to control his blood pressure better as well. The patient is recommended to continue Plavix, continue aspirin 81 mg a day, continue his beta blocker  and statin therapy. His LDL was 61 and his triglycerides were 218. Recommend diet and exercise.  3.  As mentioned above, as far as his hypertension, we added on new prescription for diltiazem to control better. His blood pressure has been under better control now.  4.  As far as his diabetes, the patient had some slight increase in his creatinine to 1..35, then to  1.58. The patient states that he is always around 1.3 and 1.5. At discharge, his creatinine is 1.58. We gave him some fluids to help hydrate him well. The patient will continue to hydrate himself orally. Metformin will have to be stopped if his creatinine persists to be above 1.5.   Other than that, the patient is discharged in good condition.   TIME SPENT: I spent about 45 minutes discharging this patient.   ____________________________ Denham Sink, MD rsg:cs D: 10/25/2013 16:20:00 ET T: 10/25/2013 18:59:02 ET JOB#: [id number]  cc: Corey Skains, MD [place] Sink, MD, <Dictator> [place] MD ELECTRONICALLY SIGNED 11/04/2013 22:14

## 2014-10-07 NOTE — H&P (Signed)
PATIENT NAME:  Bryan Scott, Bryan Scott MR#:  [id number] DATE OF BIRTH:  1945/10/07  DATE OF ADMISSION:  10/22/2013  PRIMARY CARE PHYSICIAN: Nonlocal.   REFERRING PHYSICIAN: Dr. Cheri Guppy.   CHIEF COMPLAINT: Chest pain and shortness of breath.   HISTORY OF PRESENT ILLNESS: The patient is a 69 year old male with a past medical history of hypertension, coronary artery disease, status post CABG, and hyperlipidemia, who was just admitted to the hospital on Oct 15, 2013, and was discharged on May 5 after he was treated for pneumonia. He is presenting to the ER with the chief complaint of chest pain associated with diaphoresis. The patient is reporting that his chest pain started at midnight while sitting and watching a game on the TV. The chest pain was radiating to the left arm, associated with diaphoresis. The patient was sinus tachycardic at 113 beats per minute during entire ER course. The patient has received 1 liter fluid bolus with no significant improvement. Cardizem IV was also given with no improvement in the heart rate. The patient was complaining of  pleuritic chest pain. CT angiogram of the chest was done, and pulmonary embolism is ruled out, but it does still show persistent pneumonia. The patient was given IV antibiotics, and hospitalist team is called to admit the patient for sepsis with persistent pneumonia and chest pain.   During my examination, the patient is resting comfortably. The patient is reporting that he is persistently coughing and also reports that whenever he takes prednisone by mouth, his heart rate goes up. Denies any dizziness or loss of consciousness. No other complaints.   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   PAST SURGICAL HISTORY: Bladder cancer, neck surgery x 4, back surgery x 4, shoulder repair, coronary artery bypass grafting.  ALLERGIES: LISINOPRIL AND HEPARIN.   PSYCHOSOCIAL  HISTORY: Lives at home, lives alone. Denies any history of smoking, alcohol or illicit drug usage. The patient quit smoking 11 years ago.   FAMILY HISTORY: Father deceased disease from seizure disorder. Mother deceased from cardiac arrest.   HOME MEDICATIONS: Plavix 75 mg once daily, Coreg 25 mg p.o. b.i.d., atorvastatin 80 mg once a day, Colace 50 mg 2 times a day, hydrochlorothiazide 20 mg once daily, metformin 1000 mg b.i.d., MS Contin 30 mg per 12 hour 1 tablet p.o. q.12 hours, magnesium oxide 400 mg 2 times a day, Zofran 8 mg 1 tablet p.o. q.8 hours, ranitidine 150 mg p.o. b.i.d., prednisone 10 mg tapering dose, pantoprazole 40 mg p.o. once daily.  REVIEW OF SYSTEMS:  CONSTITUTIONAL: Complaining of generalized weakness and fatigue, but denies any fever.  EYES: Denies blurry vision, double vision, glaucoma.  EARS, NOSE, THROAT: Denies epistaxis, discharge, ear pain, hearing loss.  RESPIRATORY: Complaining of still coughing. No wheezing. Denies any hemoptysis.  CARDIOVASCULAR: Complaining of midsternal chest pain radiating to left shoulder and left jaw. Denies any syncope.  ENDOCRINE: Denies polyuria, nocturia, thyroid problems.  HEMATOLOGIC AND LYMPHATIC: No anemia, easy bruising, bleeding.  INTEGUMENTARY: No acne, rash, lesions.  MUSCULOSKELETAL: No joint pain in the neck and back.  NEUROLOGIC: Denies any vertigo, ataxia, weakness, dysarthria.  PSYCHIATRIC: No ADD, OCD, insomnia.   PHYSICAL EXAMINATION: VITAL SIGNS: Temperature 97.9, pulse 134, respirations 18, blood pressure 134/85, pulse oximetry 98%.  GENERAL APPEARANCE: Not in acute distress. Moderately built and nourished.  HEENT: Normocephalic, atraumatic. Pupils are equally reacting to light and accommodation. No scleral icterus. No conjunctival injection. No sinus tenderness. Extraocular movements  are intact. No postnasal drip. Moist mucous membranes.  NECK: Supple. No JVD. No thyromegaly. No carotid bruits.  LUNGS: Moderate air  entry. No wheezing. Minimal crackles are present. CARDIAC: S1, S2 normal. Regular rate and rhythm. No murmurs.  GASTROINTESTINAL: Soft. Bowel sounds are positive in all 4 quadrants. Nontender, nondistended. No masses felt. NEUROLOGIC: Awake, alert, oriented x 3. Motor and sensory grossly intact. Reflexes are 2+.  EXTREMITIES: No edema. No cyanosis. No clubbing.   LABORATORY AND IMAGING STUDIES: Accu-Chek is 222. Glucose is 215, BNP 463, BUN 25, creatinine 1.58, sodium 132, potassium 4.0, chloride 95, CO2 of 26; anion gap is 11,  calcium 8.9. Troponin less than 0.02. WBC elevated at 12.3, hemoglobin 14.6, hematocrit 44.3; platelet count is 198. PT/INR are normal; INR is 1.0.  Chest x-ray, portable single view, has revealed no definite acute cardiopulmonary disease. Bibasilar opacities are favorable to represent atelectasis. CT angiogram of the chest: Negative for pulmonary embolism. Airspace disease in the right lung is most consistent with pneumonia.   ASSESSMENT AND PLAN: This 69 year old male who was just discharged from the hospital 2 to 3 days ago after treating for pneumonia is coming with midsternal chest pain radiating to the shoulder and jaw, will be admitted with the following assessment and plan:  1.  Chest pain with sinus tachycardia: Will admit him to telemetry, cycle cardiac biomarkers, and put a consult to cardiology.  2.  Sepsis: Probably from healthcare-associated pneumonia. The patient will be on broad-spectrum antibiotics with Zosyn and Levaquin and will narrow down the antibiotics once the patient is clinically improving.  3.  Hypertension: Resume his home medications and up-titrate on as-needed basis.  4.  Diabetes mellitus: Will continue metformin  5.  Coronary artery disease: Continue Plavix, aspirin, cholesterol medicine/atorvastatin. and beta blocker/Coreg.  6.  Hyperlipidemia: Check fasting lipid panel.  7.  We will provide him with gastrointestinal and deep vein thrombosis  prophylaxis.   The diagnoses and plan of care were discussed with the patient. He is aware of the plan.   TOTAL TIME SPENT ON ADMISSION: 50 minutes.   ____________________________ Nicholes Mango, MD ag:jcm D: 10/22/2013 07:13:42 ET T: 10/22/2013 15:55:57 ET JOB#: [id number]  cc: Nicholes Mango, MD, <Dictator> Primary Care Physician Nicholes Mango MD ELECTRONICALLY SIGNED 10/23/2013 4:48

## 2014-10-07 NOTE — H&P (Signed)
PATIENT NAME:  Bryan Scott, HORSCH MR#:  [id number] DATE OF BIRTH:  07/20/1945  DATE OF ADMISSION:  10/15/2013  PRIMARY CARE PHYSICIAN:  Nonlocal.   CHIEF COMPLAINT:  Generalized weakness, cough, shortness of breath.   HISTORY OF PRESENT ILLNESS:  Bryan Scott is a 69 year old male with history of hypertension, coronary artery disease, status post coronary artery gastric bypass grafting, previous history of smoking, presented to the Emergency Department with complaints of generalized body aches, fever, cough for the last 3 to 4 days.  The patient has been having cough with mild productive sputum, having subjective fevers.  Concerning this, came to the Emergency Department.  Work-up in the Emergency Department, the patient is found to have a sodium of 122.  The patient is complaining of headache.  CT head without contrast is unremarkable.  The patient does not have any elevated white blood cell count.   PAST MEDICAL HISTORY: 1.  Coronary artery disease status post coronary artery bypass grafting.  2.  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  3.  Hypertension.  4.  Hyperlipidemia  5.  Kidney stones.   PAST SURGICAL HISTORY: 1.  Bladder cancer.  2.  Neck surgery x 4.  3.  Back surgery x 4.  4.  Coronary artery bypass grafting.  5.  Shoulder surgery.   ALLERGIES:  LISINOPRIL AND HEPARIN.   HOME MEDICATIONS:   1.  Sucralfate 1000 mg 4 times a day. 2.  Omeprazole 40 mg 2 times a day.  3.  Nitroglycerin 0.4 mg every five minutes as needed.  4.  Metformin 1000 mg 2 times a day.  5.  Magnesium oxide 400 mg 2 times a day.  6.  Hydrochlorothiazide 25 mg once a day.  7.  Plavix 75 mg once a day.  8.  Coreg 25 mg 2 times a day. 9.  Atorvastatin 80 mg once a day.   SOCIAL HISTORY:  Quit smoking 11 years back.  Denies drinking alcohol or using illicit drugs.  Lives by himself.  Independent of ADLs and IADLs.   FAMILY HISTORY:  The patient states does not remember any obvious health problems in the family, however  from the previous records father died from seizure disorder.  Mother died from cardiac arrest.   REVIEW OF SYSTEMS:  CONSTITUTIONAL:  Severe generalized weakness.  EYES:  No change in vision.  EARS, NOSE, THROAT:  No change in hearing.  RESPIRATORY:  Has cough, shortness of breath.  CARDIOVASCULAR:  Denies any chest pain, palpations.  GASTROINTESTINAL:  No nausea, vomiting, abdominal pain.  GENITOURINARY:  No dysuria or hematuria.  HEMATOLOGY:  No easy bruising or bleeding.  ENDOCRINE:  No polyuria or polydipsia.  MUSCULOSKELETAL:  Has generalized body aches. NEUROLOGIC:  No weakness or numbness in any part of the body.   PHYSICAL EXAMINATION: GENERAL:  This is a well-built, well-nourished, age-appropriate male lying down in the bed, not in distress.  VITAL SIGNS:  Temperature 100, pulse 70, blood pressure 169/88, respiratory rate of 16, oxygen saturation is 98% on room air.  HEENT:  Head normocephalic, atraumatic.  Eyes, no scleral icterus.  Conjunctivae normal.  Pupils equal and react to light.  Extraocular movements are intact.  Mucous membranes moist.  No pharyngeal erythema.  NECK:  Supple.  No lymphadenopathy.  No JVD.  No carotid bruit.  CHEST:  Has no focal tenderness.  Bilateral wheezing and crackles are heard.  HEART:  S1, S2 regular.  No murmurs are heard.  ABDOMEN:  Bowel sounds plus.  Soft,  nontender, nondistended.  No hepatosplenomegaly.  EXTREMITIES:  No pedal edema.  Pulses 2+.  NEUROLOGIC:  The patient is alert, oriented to place, person, and time.  Cranial nerves II through XII intact.  Motor 5 by 5 in upper and lower extremities.  SKIN:  No rash or lesions.  MUSCULOSKELETAL:  Good range of motion in all the extremities.   LABORATORY DATA:  UA negative for nitrites and leukocyte esterase.  CT head without contrast:  No acute intracranial abnormality.  BMP, , the rest of all the values are within normal limits.   CBC:  WBC of 7, hemoglobin 14.8, platelet count of 161.     CHEST X-RAY:  No acute cardiopulmonary disease.   ASSESSMENT AND PLAN:  Mr. Bryan Scott is a 69 year old male who comes to the Emergency Department with complaints of shortness of breath and generalized weakness.  1.  Bronchitis/chronic obstructive pulmonary disease exacerbation, treat it as a community-acquired pneumonia as well as treating the chronic obstructive pulmonary disease exacerbation.  Chest x-ray does not show any infiltrates.  Keep the patient on Rocephin, Zithromax, DuoNebs and Solu-Medrol.  2.  Hyponatremia, most likely secondary to bronchitis, pneumonia and as well as hydrochlorothiazide.  Hold hydrochlorothiazide.  Continue with intravenous fluids.  Treat underlying pneumonia.  3.  Hypertension:  Currently well controlled.  Continue with home medications.  4.  Diabetes mellitus.  Continue with metformin 2 times a day as well as sliding scale insulin.  5.  History of coronary artery disease status post coronary artery bypass grafting.  No current issues.  Continue with atorvastatin, Coreg and Plavix.  6.  Keep the patient on deep vein thrombosis prophylaxis with Lovenox.   TIME SPENT:  50 minutes.     ____________________________ Monica Becton, MD pv:ea D: 10/16/2013 00:55:43 ET T: 10/16/2013 01:54:56 ET JOB#: [id number]  cc: Monica Becton, MD, <Dictator> Monica Becton MD ELECTRONICALLY SIGNED 10/17/2013 21:03

## 2014-10-07 NOTE — Discharge Summary (Signed)
Dates of Admission and Diagnosis:  Date of Admission 15-Oct-2013   Date of Discharge 18-Oct-2013   Admitting Diagnosis ac bronchitis   Final Diagnosis acute bronchitis Nasal stuffiness Htn Hyponatremia DM Hx of CAD    Chief Complaint/History of Present Illness Bryan Scott is a 69 year old male with history of hypertension, coronary artery disease, status post coronary artery gastric bypass grafting, previous history of smoking, presented to the Emergency Department with complaints of generalized body aches, fever, cough for the last 3 to 4 days.  The patient has been having cough with mild productive sputum, having subjective fevers.  Concerning this, came to the Emergency Department.  Work-up in the Emergency Department, the patient is found to have a sodium of 122.  The patient is complaining of headache.  CT head without contrast is unremarkable.  The patient does not have any elevated white blood cell count.   Allergies:  Lisinopril: Angioedema  Routine Chem:  02-May-15 19:59   Glucose, Serum  151  BUN 11  Creatinine (comp) 1.04  Sodium, Serum  122  Potassium, Serum 4.1  Chloride, Serum  91  CO2, Serum 21  Calcium (Total), Serum 9.2  Anion Gap 10  Osmolality (calc) 248  eGFR (African American) >60  eGFR (Non-African American) >60 (eGFR values <51m/min/1.73 m2 may be an indication of chronic kidney disease (CKD). Calculated eGFR is useful in patients with stable renal function. The eGFR calculation will not be reliable in acutely ill patients when serum creatinine is changing rapidly. It is not useful in  patients on dialysis. The eGFR calculation may not be applicable to patients at the low and high extremes of body sizes, pregnant women, and vegetarians.)  03-May-15 04:52   Creatinine (comp) 1.15  04-May-15 04:45   Creatinine (comp)  1.47  05-May-15 05:51   Glucose, Serum  255  BUN  25  Creatinine (comp)  1.35  Sodium, Serum  133  Potassium, Serum 4.4   Chloride, Serum 105  CO2, Serum 22  Calcium (Total), Serum  8.3  Anion Gap  6  Osmolality (calc) 279  eGFR (African American) >60  eGFR (Non-African American)  54 (eGFR values <629mmin/1.73 m2 may be an indication of chronic kidney disease (CKD). Calculated eGFR is useful in patients with stable renal function. The eGFR calculation will not be reliable in acutely ill patients when serum creatinine is changing rapidly. It is not useful in  patients on dialysis. The eGFR calculation may not be applicable to patients at the low and high extremes of body sizes, pregnant women, and vegetarians.)  Cardiac:  02-May-15 19:59   Troponin I < 0.02 (0.00-0.05 0.05 ng/mL or less: NEGATIVE  Repeat testing in 3-6 hrs  if clinically indicated. >0.05 ng/mL: POTENTIAL  MYOCARDIAL INJURY. Repeat  testing in 3-6 hrs if  clinically indicated. NOTE: An increase or decrease  of 30% or more on serial  testing suggests a  clinically important change)  Routine Hem:  02-May-15 19:59   WBC (CBC) 7.0  RBC (CBC) 4.78  Hemoglobin (CBC) 14.8  Hematocrit (CBC) 44.1  Platelet Count (CBC) 161  MCV 92  MCH 31.0  MCHC 33.6  RDW 13.7  Neutrophil % 79.3  Lymphocyte % 7.1  Monocyte % 13.0  Eosinophil % 0.3  Basophil % 0.3  Neutrophil # 5.5  Lymphocyte #  0.5  Monocyte # 0.9  Eosinophil # 0.0  Basophil # 0.0 (Result(s) reported on 15 Oct 2013 at 08:19PM.)   PERTINENT RADIOLOGY STUDIES: CT:  02-May-15 22:24, CT Head Without Contrast  CT Head Without Contrast   REASON FOR EXAM:    headache  COMMENTS:       PROCEDURE: CT  - CT HEAD WITHOUT CONTRAST  - Oct 15 2013 10:24PM     CLINICAL DATA:  Headache, history of bladder cancer    EXAM:  CT HEAD WITHOUT CONTRAST    TECHNIQUE:  Contiguous axial images were obtained from the base of the skull  through the vertex without intravenous contrast.    COMPARISON:  05/01/2012  FINDINGS:  No evidence of parenchymal hemorrhage or extra-axial  fluid  collection. No mass lesion, mass effect, or midline shift.    No CT evidence of acute infarction.    Mild small vessel ischemic changes.    Cerebral volume is within normal limits.  No ventriculomegaly.    The visualized paranasal sinuses are essentially clear. The mastoid  air cells are unopacified.    No evidence of calvarial fracture.   IMPRESSION:  No evidence of acute intracranial abnormality.    Mild small vessel ischemic changes.      Electronically Signed    By: Julian Hy M.D.    On: 10/15/2013 22:27         Verified By: Julian Hy, M.D.,   Pertinent Past History:  Pertinent Past History 1.  Coronary artery disease status post coronary artery bypass grafting.  2.  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  3.  Hypertension.  4.  Hyperlipidemia  5.  Kidney stones.   Hospital Course:  Hospital Course * Bronchitis/chronic obstructive pulmonary disease exacerbation, Chest x-ray does not show any infiltrates- so no pneumonia.  on Rocephin, Zithromax, DuoNebs and Solu-Medrol. much better- discharge. * post nasal drip and cough- added cough suppresants, Oxymetazolyn nd zyrtec. much better now. * Hyponatremia, most likely secondary to bronchitis,  hydrochlorothiazide.  Hold hydrochlorothiazide. improved. *  Hypertension:  Currently well controlled.  Continue with home medications.  *  Diabetes mellitus.   metformin , sliding scale insulin.  * History of coronary artery disease status post coronary artery bypass grafting.  No current issues.  Continue with atorvastatin, Coreg and Plavix.  * HypoKalemia and Mg- replaced. * Low TSH-  normal free t4.   Condition on Discharge Stable   Code Status:  Code Status Full Code   DISCHARGE INSTRUCTIONS HOME MEDS:  Medication Reconciliation: Patient's Home Medications at Discharge:     Medication Instructions  atorvastatin 80 mg oral tablet  1 tab(s) orally once a day (at bedtime)   magnesium oxide 400 mg oral  tablet  1 tab(s) orally 2 times a day   nitroglycerin 0.4 mg sublingual tablet  1 tab(s) sublingual every 5 minutes up to 3 doses as needed for chest pain. * If no relief call MD or go to Emergency Room*   metformin 1000 mg oral tablet  1 tab(s) orally 2 times a day for diabetes   carvedilol 25 mg oral tablet  1 tab(s) orally 2 times a day   clopidogrel 75 mg oral tablet  1 tab(s) orally once a day   ms contin 30 mg/12 hr oral tablet, extended release  1 tab(s) orally every 12 hours   prednisone 10 mg oral tablet  Start at 60 mg and taper by 10 mg daily until complete   zolpidem 5 mg oral tablet  1 tab(s) orally once a day (at bedtime), As Needed, sleep , As needed, sleep   ventolin hfa cfc free 90 mcg/inh inhalation  aerosol  2 puff(s) inhaled 3 times a day, As Needed - for Shortness of Breath   hydrochlorothiazide 25 mg oral tablet  1 tab(s) orally once a day   ranitidine 150 mg oral capsule  1 cap(s) orally 2 times a day   docusate sodium 50 mg oral capsule  1 cap(s) orally 2 times a day   pantoprazole 40 mg oral delayed release tablet  1 tab(s) orally once a day   zofran 8 mg oral tablet  1 tab(s) orally every 8 hours, As Needed - for Nausea, Vomiting    STOP TAKING THE FOLLOWING MEDICATION(S):    omeprazole 40 mg oral delayed release capsule: 1 cap(s) orally 2 times a day sucralfate 1 g/10 ml oral suspension: 10 milliliter(s) orally 4 times a day (before meals and at bedtime) cetirizine 10 mg oral tablet: 1 tab(s) orally every 8 hours, As Needed, rhinitis , As needed, rhinitis benzonatate 100 mg oral capsule: 1 cap(s) orally every 4 hours x 3 days, As Needed for cough levofloxacin 750 mg oral tablet: 1 tab(s) orally once a day x 3 days oxymetazoline nasal 0.05% nasal spray: 2 spray(s) nasal 2 times a day, As Needed for stuffiness  Physician's Instructions:  Diet Low Sodium  Low Fat, Low Cholesterol  Carbohydrate Controlled (ADA) Diet   Activity Limitations As tolerated   Return to  Work Not Applicable   Time frame for Follow Up Appointment 2-4 weeks   Other Comments routine follow ups with PMD.   Electronic Signatures: Vaughan Basta (MD)  (Signed 10-May-15 22:53)  Authored: ADMISSION DATE AND DIAGNOSIS, CHIEF COMPLAINT/HPI, Allergies, PERTINENT LABS, PERTINENT RADIOLOGY STUDIES, PERTINENT PAST HISTORY, HOSPITAL COURSE, [place] MEDS, PATIENT INSTRUCTIONS   Last Updated: 10-May-15 22:53 by Vaughan Basta (MD)

## 2015-03-13 ENCOUNTER — Emergency Department: Payer: Medicare Other

## 2015-03-13 ENCOUNTER — Encounter: Payer: Self-pay | Admitting: Radiology

## 2015-03-13 ENCOUNTER — Observation Stay
Admission: EM | Admit: 2015-03-13 | Discharge: 2015-03-16 | Disposition: A | Discharge status: Home / Self Care | Payer: Medicare Other | Attending: Internal Medicine | Admitting: Internal Medicine

## 2015-03-13 DIAGNOSIS — I480 Paroxysmal atrial fibrillation: Secondary | ICD-10-CM | POA: Insufficient documentation

## 2015-03-13 DIAGNOSIS — Z8249 Family history of ischemic heart disease and other diseases of the circulatory system: Secondary | ICD-10-CM | POA: Insufficient documentation

## 2015-03-13 DIAGNOSIS — I252 Old myocardial infarction: Secondary | ICD-10-CM | POA: Diagnosis not present

## 2015-03-13 DIAGNOSIS — N200 Calculus of kidney: Secondary | ICD-10-CM | POA: Insufficient documentation

## 2015-03-13 DIAGNOSIS — R112 Nausea with vomiting, unspecified: Secondary | ICD-10-CM | POA: Insufficient documentation

## 2015-03-13 DIAGNOSIS — K529 Noninfective gastroenteritis and colitis, unspecified: Secondary | ICD-10-CM | POA: Diagnosis not present

## 2015-03-13 DIAGNOSIS — R0789 Other chest pain: Secondary | ICD-10-CM | POA: Diagnosis not present

## 2015-03-13 DIAGNOSIS — R109 Unspecified abdominal pain: Secondary | ICD-10-CM

## 2015-03-13 DIAGNOSIS — K760 Fatty (change of) liver, not elsewhere classified: Secondary | ICD-10-CM | POA: Diagnosis not present

## 2015-03-13 DIAGNOSIS — G894 Chronic pain syndrome: Secondary | ICD-10-CM | POA: Insufficient documentation

## 2015-03-13 DIAGNOSIS — Z951 Presence of aortocoronary bypass graft: Secondary | ICD-10-CM | POA: Diagnosis not present

## 2015-03-13 DIAGNOSIS — Z888 Allergy status to other drugs, medicaments and biological substances status: Secondary | ICD-10-CM | POA: Insufficient documentation

## 2015-03-13 DIAGNOSIS — Z7982 Long term (current) use of aspirin: Secondary | ICD-10-CM | POA: Insufficient documentation

## 2015-03-13 DIAGNOSIS — I2510 Atherosclerotic heart disease of native coronary artery without angina pectoris: Secondary | ICD-10-CM | POA: Diagnosis not present

## 2015-03-13 DIAGNOSIS — Z820 Family history of epilepsy and other diseases of the nervous system: Secondary | ICD-10-CM | POA: Insufficient documentation

## 2015-03-13 DIAGNOSIS — I10 Essential (primary) hypertension: Secondary | ICD-10-CM | POA: Diagnosis present

## 2015-03-13 DIAGNOSIS — R918 Other nonspecific abnormal finding of lung field: Secondary | ICD-10-CM | POA: Insufficient documentation

## 2015-03-13 DIAGNOSIS — Z79899 Other long term (current) drug therapy: Secondary | ICD-10-CM | POA: Diagnosis not present

## 2015-03-13 DIAGNOSIS — I160 Hypertensive urgency: Secondary | ICD-10-CM | POA: Diagnosis present

## 2015-03-13 DIAGNOSIS — Z7901 Long term (current) use of anticoagulants: Secondary | ICD-10-CM | POA: Diagnosis not present

## 2015-03-13 DIAGNOSIS — K219 Gastro-esophageal reflux disease without esophagitis: Secondary | ICD-10-CM | POA: Diagnosis not present

## 2015-03-13 DIAGNOSIS — I509 Heart failure, unspecified: Secondary | ICD-10-CM | POA: Diagnosis not present

## 2015-03-13 DIAGNOSIS — R079 Chest pain, unspecified: Secondary | ICD-10-CM | POA: Diagnosis present

## 2015-03-13 DIAGNOSIS — E785 Hyperlipidemia, unspecified: Secondary | ICD-10-CM | POA: Diagnosis not present

## 2015-03-13 DIAGNOSIS — Z87891 Personal history of nicotine dependence: Secondary | ICD-10-CM | POA: Diagnosis not present

## 2015-03-13 DIAGNOSIS — E119 Type 2 diabetes mellitus without complications: Secondary | ICD-10-CM | POA: Insufficient documentation

## 2015-03-13 HISTORY — DX: Malignant (primary) neoplasm, unspecified: C80.1

## 2015-03-13 LAB — COMPREHENSIVE METABOLIC PANEL
ALT: 31 U/L (ref 17–63)
ANION GAP: 10 (ref 5–15)
AST: 31 U/L (ref 15–41)
Albumin: 4.1 g/dL (ref 3.5–5.0)
Alkaline Phosphatase: 68 U/L (ref 38–126)
BILIRUBIN TOTAL: 0.7 mg/dL (ref 0.3–1.2)
BUN: 16 mg/dL (ref 6–20)
CHLORIDE: 104 mmol/L (ref 101–111)
CO2: 20 mmol/L — ABNORMAL LOW (ref 22–32)
Calcium: 9 mg/dL (ref 8.9–10.3)
Creatinine, Ser: 1.21 mg/dL (ref 0.61–1.24)
GFR calc Af Amer: >60 mL/min (ref >60)
GFR, EST NON AFRICAN AMERICAN: 59 mL/min — AB (ref >60)
Glucose, Bld: 218 mg/dL — ABNORMAL HIGH (ref 65–99)
POTASSIUM: 4.1 mmol/L (ref 3.5–5.1)
Sodium: 134 mmol/L — ABNORMAL LOW (ref 135–145)
TOTAL PROTEIN: 7.2 g/dL (ref 6.5–8.1)

## 2015-03-13 LAB — TROPONIN I: TROPONIN I: 0.05 ng/mL — AB (ref <0.031)

## 2015-03-13 LAB — CBC
HCT: 49.5 % (ref 40.0–52.0)
HEMATOCRIT: 43.7 % (ref 40.0–52.0)
Hemoglobin: 15.3 g/dL (ref 13.0–18.0)
Hemoglobin: 16.8 g/dL (ref 13.0–18.0)
MCH: 31.1 pg (ref 26.0–34.0)
MCH: 32.3 pg (ref 26.0–34.0)
MCHC: 33.9 g/dL (ref 32.0–36.0)
MCHC: 35.1 g/dL (ref 32.0–36.0)
MCV: 91.7 fL (ref 80.0–100.0)
MCV: 92.2 fL (ref 80.0–100.0)
PLATELETS: 190 10*3/uL (ref 150–440)
PLATELETS: 223 10*3/uL (ref 150–440)
RBC: 4.74 MIL/uL (ref 4.40–5.90)
RBC: 5.4 MIL/uL (ref 4.40–5.90)
RDW: 13.9 % (ref 11.5–14.5)
RDW: 14 % (ref 11.5–14.5)
WBC: 13.6 10*3/uL — AB (ref 3.8–10.6)
WBC: 14.9 10*3/uL — AB (ref 3.8–10.6)

## 2015-03-13 LAB — BASIC METABOLIC PANEL
Anion gap: 11 (ref 5–15)
BUN: 20 mg/dL (ref 6–20)
CALCIUM: 10.1 mg/dL (ref 8.9–10.3)
CO2: 24 mmol/L (ref 22–32)
CREATININE: 1.4 mg/dL — AB (ref 0.61–1.24)
Chloride: 98 mmol/L — ABNORMAL LOW (ref 101–111)
GFR calc non Af Amer: 50 mL/min — ABNORMAL LOW (ref >60)
GFR, EST AFRICAN AMERICAN: 58 mL/min — AB (ref >60)
Glucose, Bld: 311 mg/dL — ABNORMAL HIGH (ref 65–99)
Potassium: 4 mmol/L (ref 3.5–5.1)
SODIUM: 133 mmol/L — AB (ref 135–145)

## 2015-03-13 LAB — OCCULT BLOOD X 1 CARD TO LAB, STOOL: FECAL OCCULT BLD: POSITIVE — AB

## 2015-03-13 LAB — C DIFFICILE QUICK SCREEN W PCR REFLEX
C DIFFICLE (CDIFF) ANTIGEN: NEGATIVE
C Diff interpretation: NEGATIVE
C Diff toxin: NEGATIVE

## 2015-03-13 LAB — GLUCOSE, CAPILLARY
GLUCOSE-CAPILLARY: 246 mg/dL — AB (ref 65–99)
Glucose-Capillary: 268 mg/dL — ABNORMAL HIGH (ref 65–99)

## 2015-03-13 IMAGING — CR DG CHEST 1V PORT
1 series · 1 of 1 positions shown · non-contrast
Comparison: Chest radiograph [DATE]

CLINICAL DATA: Vomiting, mid chest pain beginning this morning.
Evaluate acute myocardial infarction. History of hypertension,
coronary artery disease, hyperlipidemia, smoker, diabetes.

EXAM:
PORTABLE CHEST 1 VIEW

[portable]
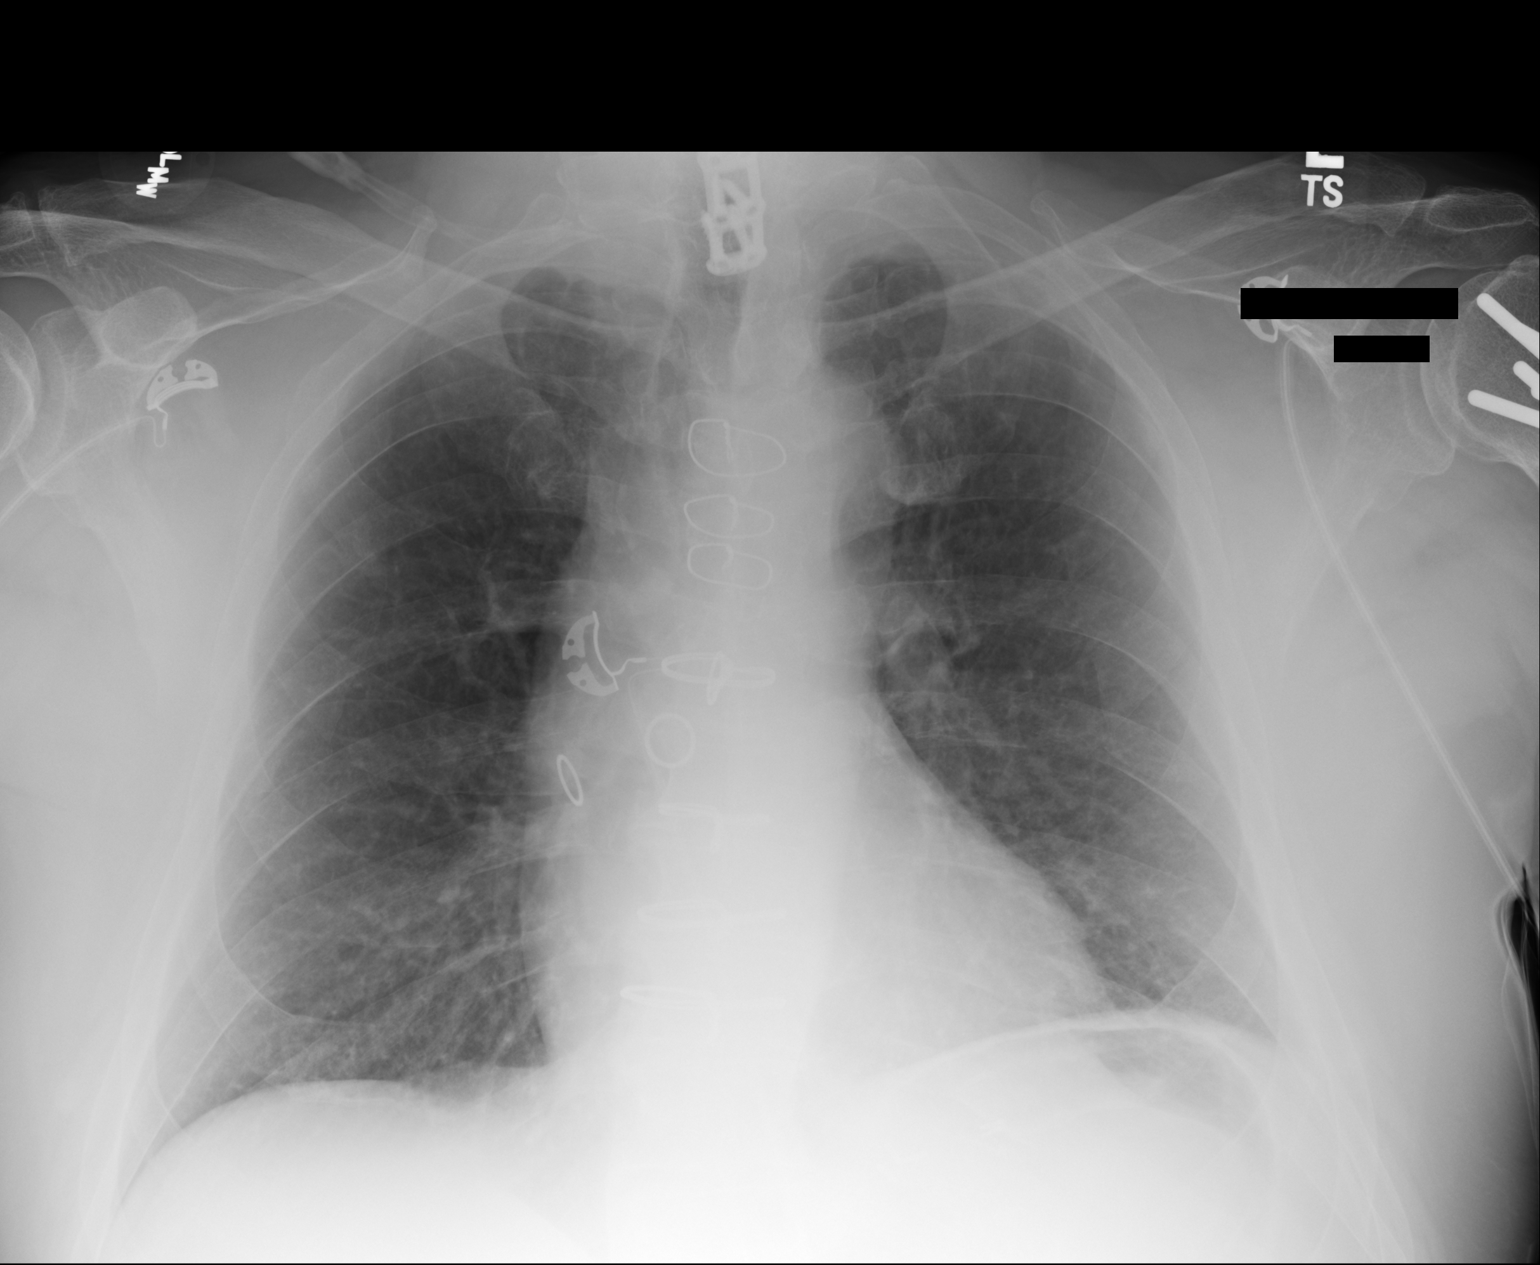

[1 of 1 positions shown; findings below may reference images not displayed]

FINDINGS: Cardiomediastinal silhouette is normal, status post median
sternotomy for CABG. Similar mild chronic interstitial changes
without pleural effusion or focal consolidation. Similar biapical
pleural thickening. No pneumothorax. ACDF partially characterized.
Screws within LEFT humeral head partially imaged.
IMPRESSION: Stable chronic bronchitic changes.

## 2015-03-13 IMAGING — CT CT ANGIO CHEST
2 of 6 series · 17 of 36 positions shown · IV contrast (APPLIED)
Comparison: Chest CT [DATE]

CLINICAL DATA: Chest and abdominal pain.

EXAM:
CT ANGIOGRAPHY CHEST, ABDOMEN AND PELVIS
TECHNIQUE: Multidetector CT imaging through the chest, abdomen and pelvis was
performed using the standard protocol during bolus administration of
intravenous contrast. Multiplanar reconstructed images and MIPs were
obtained and reviewed to evaluate the vascular anatomy.
CONTRAST:  75mL OMNIPAQUE IOHEXOL 350 MG/ML SOLN

[Series 6: arterial · axial · arterial · 0.80mm/px · z∈[-744,-110]mm · 16 of 349 slices shown]
[im 16/349  lung]
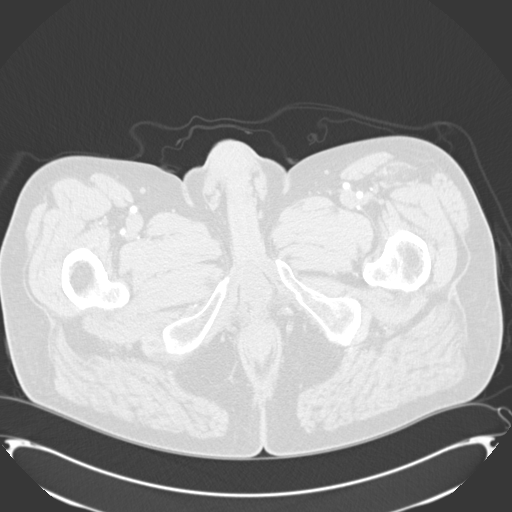
[im 46/349  mediastinal]
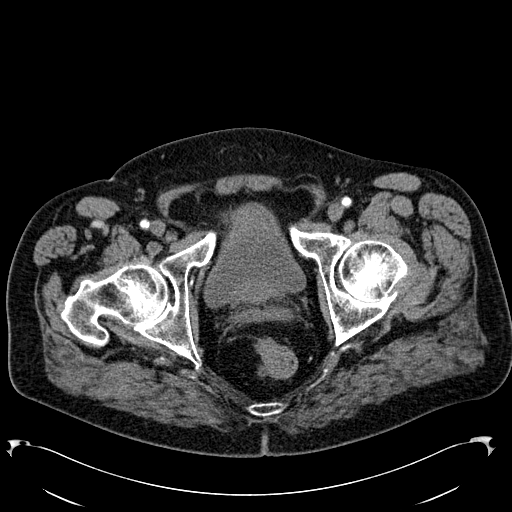
[im 61/349  lung]
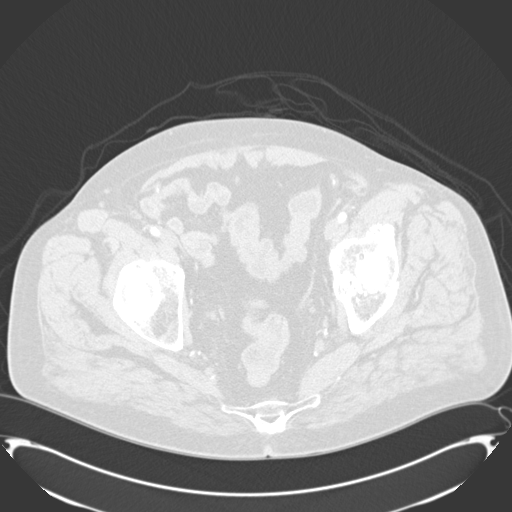
[im 76/349  mediastinal]
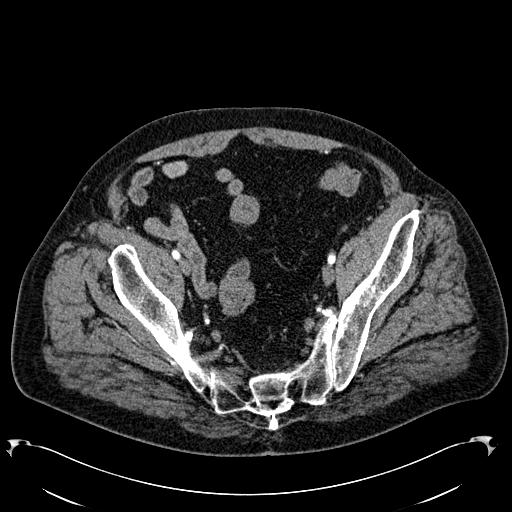
[im 106/349  lung]
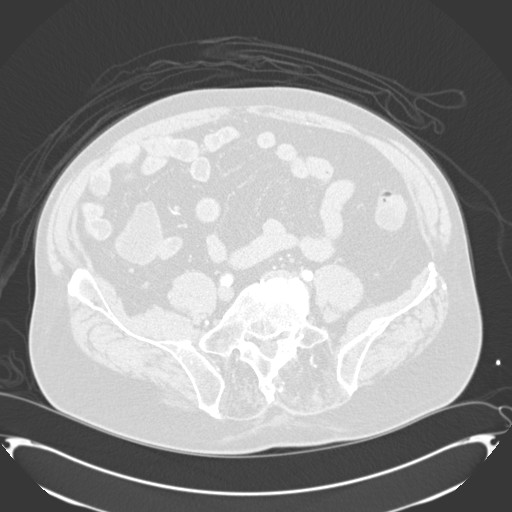
[im 122/349  mediastinal]
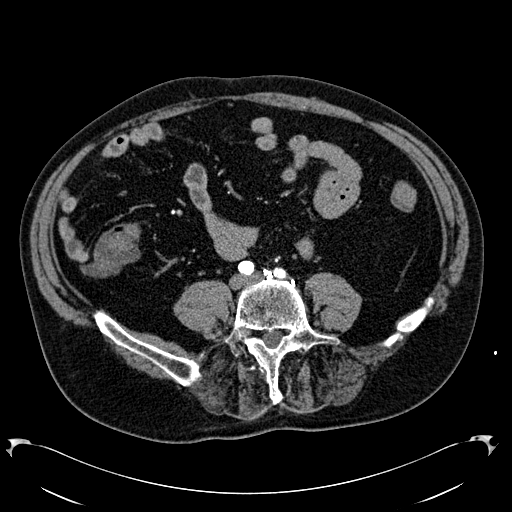
[im 137/349  lung]
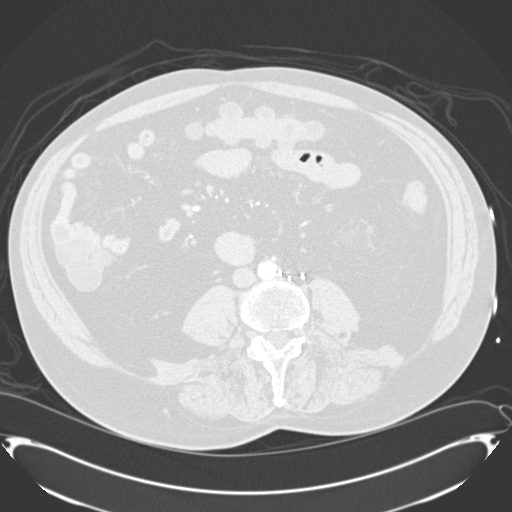
[im 167/349  mediastinal]
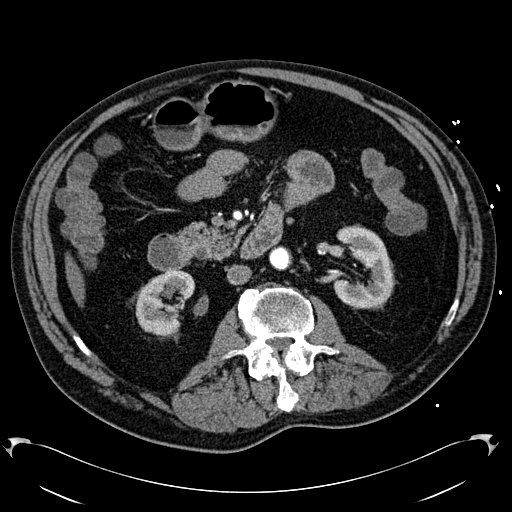
[im 182/349  lung]
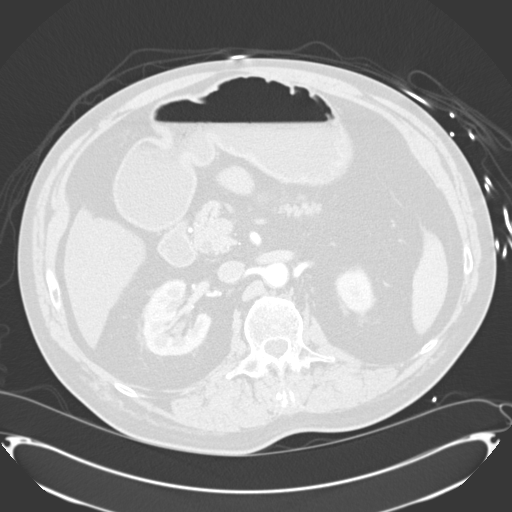
[im 212/349  mediastinal]
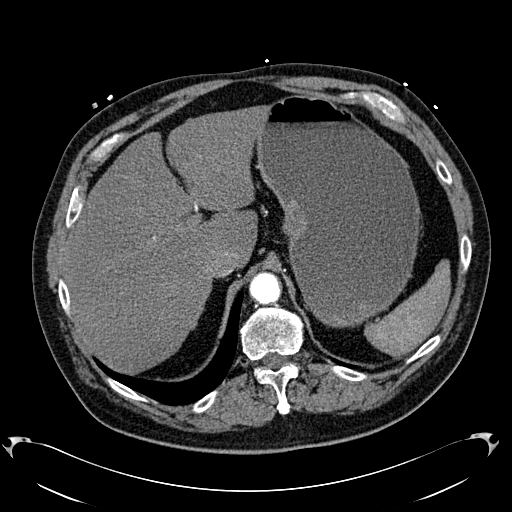
[im 227/349  lung]
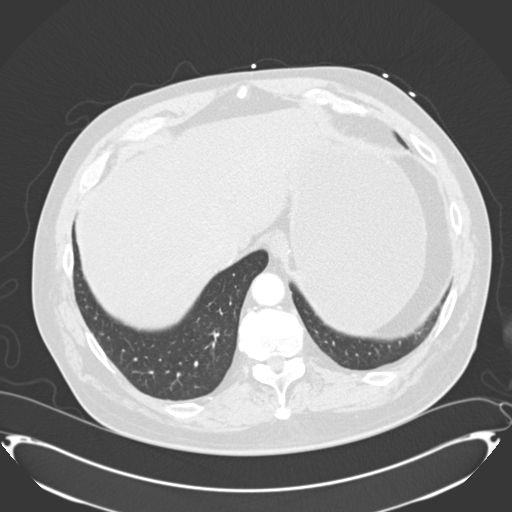
[im 243/349  mediastinal]
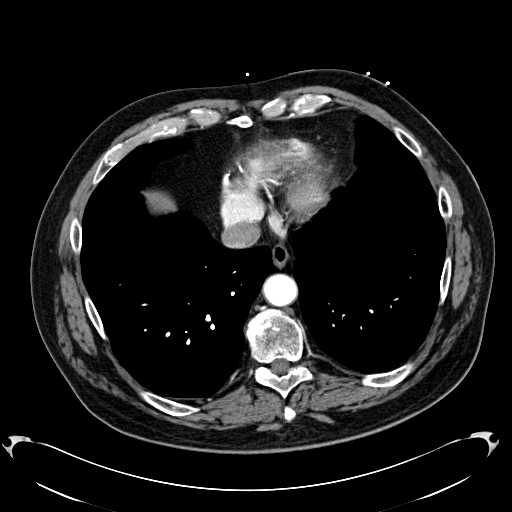
[im 273/349  lung]
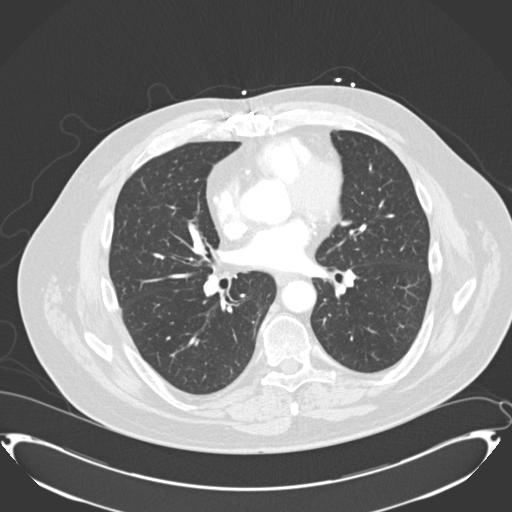
[im 288/349  mediastinal]
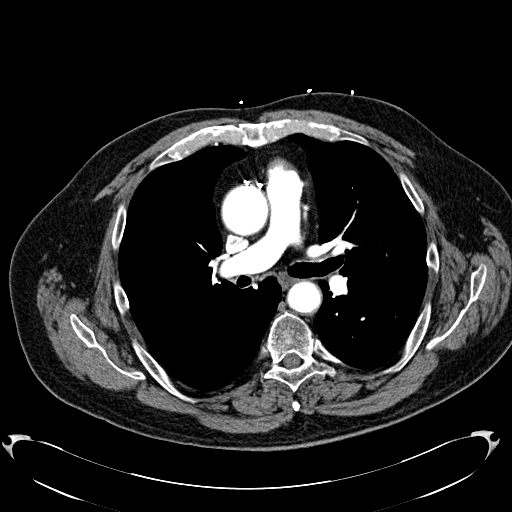
[im 303/349  lung]
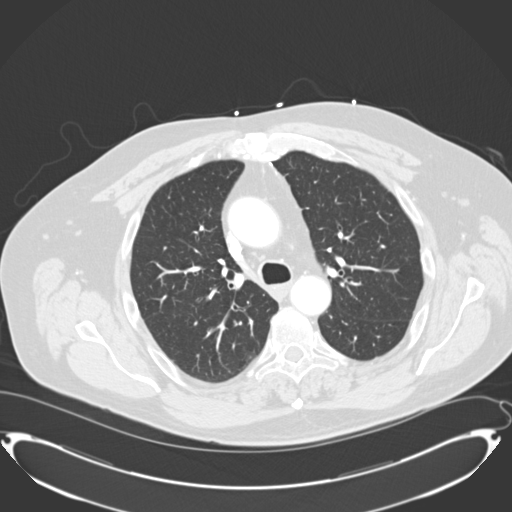
[im 333/349  mediastinal]
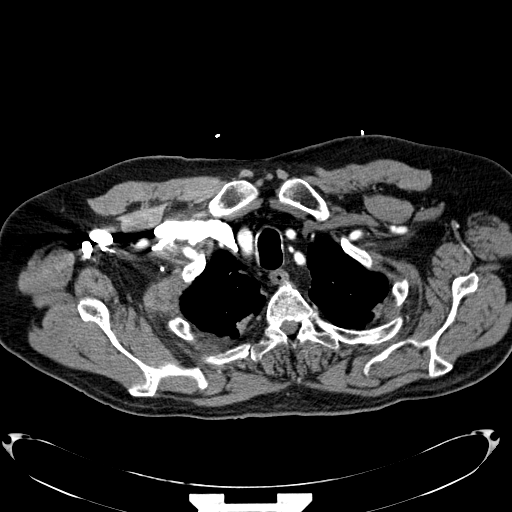

[Series 8: cor arterial mpr · coronal · arterial · 0.78mm/px · 1 of 143 slices shown]
[im 72/143  mediastinal]
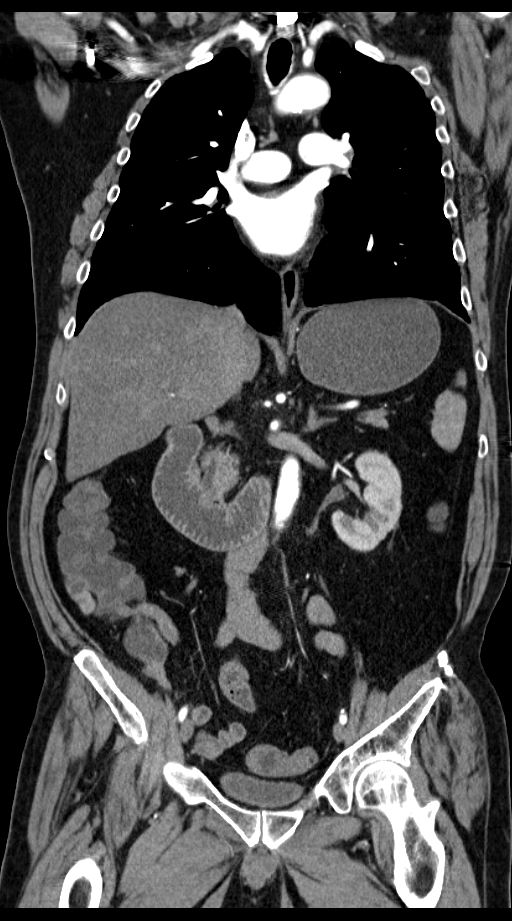

[17 of 36 positions shown; findings below may reference images not displayed]

FINDINGS: CTA CHEST FINDINGS

THORACIC INLET/BODY WALL:

No acute abnormality.

MEDIASTINUM:

Normal heart size. No pericardial effusion. Extensive
atherosclerosis, status post coronary artery bypass. The lower
venous graft and the [REDACTED] graft are diminutive and typical to
visualize distally on this nongated study. No evidence of aortic
intramural hematoma, dissection, or other acute aortic syndrome. No
aortic aneurysm. No visualized pulmonary embolism.

LUNG WINDOWS:

No consolidation. No effusion. Diffuse bronchial wall thickening,
likely related to patient's history of smoking.

OSSEOUS:

No acute fracture.  No suspicious lytic or blastic lesions.

Review of the MIP images confirms the above findings.

CTA ABDOMEN AND PELVIS FINDINGS

Hepatobiliary: Hepatic steatosis. No focal liver abnormality.No
evidence of biliary obstruction or stone.

Pancreas: Unremarkable.

Spleen: Unremarkable.

Adrenals/Urinary Tract: Negative adrenals. 11 mm right
nephrolithiasis. No hydronephrosis or ureteral stone. The left
ureter is distorted, medialized near the retroperitoneal surgical
changes, but not dilated or thickened. Unremarkable bladder.

Reproductive:No pathologic findings.

Stomach/Bowel: Fluid dilated stomach and duodenum without
obstructive process. No appendicitis.

Vascular/Lymphatic: Separate origin hepatic artery, otherwise
standard aortic branching. There is diffuse atherosclerosis,
predominately noncalcified, without flow limiting visceral stenosis.
Status post stenting of the right common iliac artery, widely
patent. There is also surgical change near the right common femoral
artery. No evidence of dissection or aneurysm. No inflammatory
changes.

No mass or adenopathy.

Peritoneal: No ascites or pneumoperitoneum.

Musculoskeletal: L3-4 and L4-5 anterior lumbar interbody fusion,
accounting for retroperitoneal surgical clips. Bone harvest site
from the left iliac crest. Heterotopic ossification in the upper
right rectus femoris.

Review of the MIP images confirms the above findings.
IMPRESSION: 1. No acute arterial finding.
2. Fluid filled stomach and duodenum without obstructive process.
3. Hepatic steatosis and right nephrolithiasis.

## 2015-03-13 MED ORDER — ACETAMINOPHEN 650 MG RE SUPP
650.0000 mg | Freq: Four times a day (QID) | RECTAL | Status: DC | PRN
Start: 2015-03-13 — End: 2015-03-16

## 2015-03-13 MED ORDER — METOPROLOL TARTRATE 25 MG PO TABS
25.0000 mg | ORAL_TABLET | Freq: Two times a day (BID) | ORAL | Status: DC
  Administered 2015-03-13 – 2015-03-14 (×3): 25 mg via ORAL
  Filled 2015-03-13 (×3): qty 1

## 2015-03-13 MED ORDER — INSULIN ASPART 100 UNIT/ML ~~LOC~~ SOLN
0.0000 [IU] | Freq: Three times a day (TID) | SUBCUTANEOUS | Status: DC
  Administered 2015-03-13: 8 [IU] via SUBCUTANEOUS
  Administered 2015-03-14: 5 [IU] via SUBCUTANEOUS
  Administered 2015-03-14 – 2015-03-15 (×3): 3 [IU] via SUBCUTANEOUS
  Administered 2015-03-16 (×2): 5 [IU] via SUBCUTANEOUS
  Filled 2015-03-13: qty 5
  Filled 2015-03-13 (×2): qty 3
  Filled 2015-03-13 (×2): qty 5
  Filled 2015-03-13: qty 8

## 2015-03-13 MED ORDER — HYDRALAZINE HCL 20 MG/ML IJ SOLN
10.0000 mg | INTRAMUSCULAR | Status: DC | PRN
  Administered 2015-03-14: 10 mg via INTRAVENOUS
  Filled 2015-03-13 (×2): qty 1

## 2015-03-13 MED ORDER — ATORVASTATIN CALCIUM 20 MG PO TABS
40.0000 mg | ORAL_TABLET | Freq: Every day | ORAL | Status: DC
  Administered 2015-03-13 – 2015-03-16 (×4): 40 mg via ORAL
  Filled 2015-03-13 (×4): qty 2

## 2015-03-13 MED ORDER — ONDANSETRON HCL 4 MG PO TABS: 4.0000 mg | ORAL_TABLET | Freq: Four times a day (QID) | ORAL | Status: DC | PRN

## 2015-03-13 MED ORDER — SODIUM CHLORIDE 0.9 % IV BOLUS (SEPSIS)
1000.0000 mL | Freq: Once | INTRAVENOUS | Status: AC
  Administered 2015-03-13: 1000 mL via INTRAVENOUS

## 2015-03-13 MED ORDER — ONDANSETRON HCL 4 MG/2ML IJ SOLN
4.0000 mg | Freq: Once | INTRAMUSCULAR | Status: AC
  Administered 2015-03-13: 4 mg via INTRAVENOUS
  Filled 2015-03-13: qty 2

## 2015-03-13 MED ORDER — ASPIRIN EC 81 MG PO TBEC
81.0000 mg | DELAYED_RELEASE_TABLET | Freq: Every day | ORAL | Status: DC
  Administered 2015-03-13 – 2015-03-16 (×4): 81 mg via ORAL
  Filled 2015-03-13 (×4): qty 1

## 2015-03-13 MED ORDER — SODIUM CHLORIDE 0.9 % IV SOLN
INTRAVENOUS | Status: AC
  Administered 2015-03-13: 12:00:00 via INTRAVENOUS

## 2015-03-13 MED ORDER — MORPHINE SULFATE (PF) 2 MG/ML IV SOLN
2.0000 mg | INTRAVENOUS | Status: DC | PRN
  Administered 2015-03-13 – 2015-03-16 (×13): 2 mg via INTRAVENOUS
  Filled 2015-03-13 (×13): qty 1

## 2015-03-13 MED ORDER — ENOXAPARIN SODIUM 40 MG/0.4ML ~~LOC~~ SOLN
40.0000 mg | SUBCUTANEOUS | Status: DC
  Administered 2015-03-13 – 2015-03-15 (×3): 40 mg via SUBCUTANEOUS
  Filled 2015-03-13 (×2): qty 0.4

## 2015-03-13 MED ORDER — AMLODIPINE BESYLATE 10 MG PO TABS
10.0000 mg | ORAL_TABLET | Freq: Every day | ORAL | Status: DC
  Administered 2015-03-13 – 2015-03-14 (×2): 10 mg via ORAL
  Filled 2015-03-13 (×2): qty 1

## 2015-03-13 MED ORDER — MORPHINE SULFATE 15 MG PO TABS
30.0000 mg | ORAL_TABLET | ORAL | Status: DC | PRN
Start: 2015-03-13 — End: 2015-03-13

## 2015-03-13 MED ORDER — NITROGLYCERIN 0.4 MG SL SUBL
0.4000 mg | SUBLINGUAL_TABLET | SUBLINGUAL | Status: DC | PRN
  Administered 2015-03-14: 0.4 mg via SUBLINGUAL
  Filled 2015-03-13: qty 1

## 2015-03-13 MED ORDER — MORPHINE SULFATE (PF) 2 MG/ML IV SOLN
2.0000 mg | Freq: Once | INTRAVENOUS | Status: AC
  Administered 2015-03-13: 2 mg via INTRAVENOUS
  Filled 2015-03-13: qty 1

## 2015-03-13 MED ORDER — SENNOSIDES-DOCUSATE SODIUM 8.6-50 MG PO TABS: 1.0000 | ORAL_TABLET | Freq: Every evening | ORAL | Status: DC | PRN

## 2015-03-13 MED ORDER — ONDANSETRON HCL 4 MG/2ML IJ SOLN
INTRAMUSCULAR | Status: AC
  Administered 2015-03-13: 8 mg
  Filled 2015-03-13: qty 4

## 2015-03-13 MED ORDER — LABETALOL HCL 5 MG/ML IV SOLN
INTRAVENOUS | Status: AC
  Administered 2015-03-13: 20 mg via INTRAVENOUS
  Filled 2015-03-13: qty 4

## 2015-03-13 MED ORDER — LABETALOL HCL 5 MG/ML IV SOLN
20.0000 mg | Freq: Once | INTRAVENOUS | Status: AC
  Administered 2015-03-13: 20 mg via INTRAVENOUS

## 2015-03-13 MED ORDER — MORPHINE SULFATE (PF) 4 MG/ML IV SOLN
4.0000 mg | Freq: Once | INTRAVENOUS | Status: AC
  Administered 2015-03-13: 4 mg via INTRAVENOUS
  Filled 2015-03-13: qty 1

## 2015-03-13 MED ORDER — OXYCODONE HCL 5 MG PO TABS
5.0000 mg | ORAL_TABLET | ORAL | Status: DC | PRN
  Administered 2015-03-14 – 2015-03-15 (×2): 5 mg via ORAL
  Filled 2015-03-13 (×3): qty 1

## 2015-03-13 MED ORDER — ALUM & MAG HYDROXIDE-SIMETH 200-200-20 MG/5ML PO SUSP: 30.0000 mL | Freq: Four times a day (QID) | ORAL | Status: DC | PRN

## 2015-03-13 MED ORDER — ZOLPIDEM TARTRATE 5 MG PO TABS
5.0000 mg | ORAL_TABLET | Freq: Every evening | ORAL | Status: DC | PRN
  Administered 2015-03-13: 5 mg via ORAL
  Filled 2015-03-13: qty 1

## 2015-03-13 MED ORDER — ACETAMINOPHEN 325 MG PO TABS
650.0000 mg | ORAL_TABLET | Freq: Four times a day (QID) | ORAL | Status: DC | PRN
  Administered 2015-03-14: 650 mg via ORAL
  Filled 2015-03-13: qty 2

## 2015-03-13 MED ORDER — GI COCKTAIL ~~LOC~~
30.0000 mL | Freq: Once | ORAL | Status: AC
  Administered 2015-03-13: 30 mL via ORAL
  Filled 2015-03-13: qty 30

## 2015-03-13 MED ORDER — FAMOTIDINE 20 MG PO TABS
20.0000 mg | ORAL_TABLET | Freq: Every day | ORAL | Status: DC
  Administered 2015-03-13: 20 mg via ORAL
  Filled 2015-03-13: qty 1

## 2015-03-13 MED ORDER — ENOXAPARIN SODIUM 100 MG/ML ~~LOC~~ SOLN
SUBCUTANEOUS | Status: AC
  Filled 2015-03-13: qty 1

## 2015-03-13 MED ORDER — ALUM & MAG HYDROXIDE-SIMETH 200-200-20 MG/5ML PO SUSP
30.0000 mL | Freq: Four times a day (QID) | ORAL | Status: DC | PRN
  Administered 2015-03-13 (×2): 30 mL via ORAL
  Filled 2015-03-13 (×2): qty 30

## 2015-03-13 MED ORDER — SODIUM CHLORIDE 0.9 % IJ SOLN
3.0000 mL | Freq: Two times a day (BID) | INTRAMUSCULAR | Status: DC
  Administered 2015-03-13 – 2015-03-16 (×6): 3 mL via INTRAVENOUS

## 2015-03-13 MED ORDER — MAGNESIUM OXIDE 400 (241.3 MG) MG PO TABS
400.0000 mg | ORAL_TABLET | Freq: Every day | ORAL | Status: DC
  Administered 2015-03-13 – 2015-03-16 (×4): 400 mg via ORAL
  Filled 2015-03-13 (×4): qty 1

## 2015-03-13 MED ORDER — PANTOPRAZOLE SODIUM 40 MG PO TBEC
40.0000 mg | DELAYED_RELEASE_TABLET | Freq: Every day | ORAL | Status: DC
  Administered 2015-03-13 – 2015-03-16 (×4): 40 mg via ORAL
  Filled 2015-03-13 (×4): qty 1

## 2015-03-13 MED ORDER — SODIUM CHLORIDE 0.9 % IV SOLN: 8.0000 mg | Freq: Once | INTRAVENOUS | Status: DC

## 2015-03-13 MED ORDER — INSULIN ASPART 100 UNIT/ML ~~LOC~~ SOLN
0.0000 [IU] | Freq: Every day | SUBCUTANEOUS | Status: DC
  Administered 2015-03-13 – 2015-03-14 (×2): 2 [IU] via SUBCUTANEOUS
  Filled 2015-03-13: qty 2
  Filled 2015-03-13: qty 3
  Filled 2015-03-13: qty 2

## 2015-03-13 MED ORDER — IOHEXOL 350 MG/ML SOLN
75.0000 mL | Freq: Once | INTRAVENOUS | Status: AC | PRN
  Administered 2015-03-13: 75 mL via INTRAVENOUS

## 2015-03-13 MED ORDER — ALUM & MAG HYDROXIDE-SIMETH 200-200-20 MG/5ML PO SUSP
30.0000 mL | Freq: Once | ORAL | Status: AC
Start: 2015-03-13 — End: 2015-03-13
  Administered 2015-03-13: 30 mL via ORAL
  Filled 2015-03-13: qty 30

## 2015-03-13 MED ORDER — ONDANSETRON HCL 4 MG/2ML IJ SOLN
4.0000 mg | Freq: Four times a day (QID) | INTRAMUSCULAR | Status: DC | PRN
  Administered 2015-03-13 – 2015-03-16 (×9): 4 mg via INTRAVENOUS
  Filled 2015-03-13 (×9): qty 2

## 2015-03-13 MED ORDER — HYDRALAZINE HCL 25 MG PO TABS
25.0000 mg | ORAL_TABLET | Freq: Three times a day (TID) | ORAL | Status: DC
  Administered 2015-03-13 – 2015-03-16 (×8): 25 mg via ORAL
  Filled 2015-03-13 (×8): qty 1

## 2015-03-13 NOTE — H&P (Signed)
[facility] [place] NAME: Nethaniel Mattie    MR#:  [id number]  DATE OF BIRTH:  1946/03/31   DATE OF ADMISSION:  03/13/2015  PRIMARY CARE PHYSICIAN: No primary care provider on file. [facility] [place]  REQUESTING/REFERRING PHYSICIAN: Marjean Donna  CHIEF COMPLAINT:   Chief Complaint  Patient presents with  . Chest Pain    HISTORY OF PRESENT ILLNESS:  Adil Tugwell  is a 69 y.o. male with a known history of coronary artery disease status post bypass presented with chest pain. He states his symptoms after started approximately 2 days ago with the nausea vomiting diarrhea, nonbloody nonbilious emesis, watery bowel movements with associated chills denies any fevers. However had acute onset chest pain retrosternal location, sharp in quality nonradiating intensity 10/10 with worsening nausea no worsening or relieving factors this occurred at rest. Given the chest pains decided to present to Hospital further workup and evaluation.  PAST MEDICAL HISTORY:   Past Medical History  Diagnosis Date  . Hypertension   . GERD (gastroesophageal reflux disease)   . Chronic pain syndrome   . Hyperlipidemia   . Coronary artery disease     s/p CABG ~ 2007  . History of tobacco use   . Type 2 diabetes mellitus     A1C 6.8% in 01/2013  . Carotid artery disease     > 75% bilateral ICA stenoses on 01/2013 CT  . 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 Heparin allergy     Bleeding  . Paroxysmal atrial fibrillation     On Xarelto anticoagulation  . Cancer   . CHF (congestive heart failure)     PAST SURGICAL HISTORY:   Past Surgical History  Procedure Laterality Date  . Cardiac catheterization    . Coronary artery bypass graft  2007    SOCIAL HISTORY:   Social History  Substance Use Topics  . Smoking status: Former Smoker -- 0.00 packs/day    Types: Cigarettes  . Smokeless tobacco: Not on file     Comment: 30 pack-year history. Quit 8-9 years  ago.   . Alcohol Use: No    FAMILY HISTORY:   Family History  Problem Relation Age of Onset  . Seizures Father 59    Deceased from complications with seizures  . Heart disease Mother 75    Deceased    DRUG ALLERGIES:   Allergies  Allergen Reactions  . Heparin Other (See Comments)    bleeding  . Lisinopril Swelling    REVIEW OF SYSTEMS:  REVIEW OF SYSTEMS:  CONSTITUTIONAL: Denies fevers, chills, fatigue, weakness.  EYES: Denies blurred vision, double vision, or eye pain.  EARS, NOSE, THROAT: Denies tinnitus, ear pain, hearing loss.  RESPIRATORY: denies cough, shortness of breath, wheezing  CARDIOVASCULAR: Positive chest pain, denies palpitations, edema.  GASTROINTESTINAL: Positive nausea, vomiting, diarrhea, denies abdominal pain.  GENITOURINARY: Denies dysuria, hematuria.  ENDOCRINE: Denies nocturia or thyroid problems. HEMATOLOGIC AND LYMPHATIC: Denies easy bruising or bleeding.  SKIN: Denies rash or lesions.  MUSCULOSKELETAL: Denies pain in neck, back, shoulder, knees, hips, or further arthritic symptoms.  NEUROLOGIC: Denies paralysis, paresthesias.  PSYCHIATRIC: Denies anxiety or depressive symptoms. Otherwise full review of systems performed by me is negative.   MEDICATIONS AT HOME:   Prior to Admission medications   Medication Sig Start Date End Date Taking? Authorizing Provider  amLODipine (NORVASC) 10 MG tablet Take 10 mg by mouth daily.    Historical Provider, MD  aspirin 81 MG tablet Take 81 mg by mouth daily.    Historical Provider, MD  atorvastatin (LIPITOR) 40 MG tablet Take 40 mg by mouth daily.    Historical Provider, MD  hydrALAZINE (APRESOLINE) 25 MG tablet Take 25 mg by mouth every 8 (eight) hours as needed.    Historical Provider, MD  magnesium oxide (MAG-OX) 400 MG tablet Take 400 mg by mouth daily.    Historical Provider, MD  morphine (MSIR) 30 MG tablet Take 30 mg by mouth 3 (three) times daily as needed.    Historical Provider, MD  omeprazole  (PRILOSEC) 20 MG capsule Take 40 mg by mouth 2 (two) times daily.    Historical Provider, MD  ondansetron (ZOFRAN) 8 MG tablet Take by mouth every 8 (eight) hours as needed.    Historical Provider, MD  ranitidine (ZANTAC) 150 MG tablet Take 150 mg by mouth 2 (two) times daily.    Historical Provider, MD  senna-docusate (SENOKOT-S) 8.6-50 MG per tablet Take 1 tablet by mouth daily as needed.    Historical Provider, MD      VITAL SIGNS:  Blood pressure 172/103, pulse 66, resp. rate 25, height 6' (1.829 m), weight 218 lb 4.1 oz (99 kg), SpO2 98 %.  PHYSICAL EXAMINATION:  VITAL SIGNS: Filed Vitals:   03/13/15 0244  BP: 172/103  Pulse: 66  Resp: 25   GENERAL:69 y.o.male currently in no acute distress.  HEAD: Normocephalic, atraumatic.  EYES: Pupils equal, round, reactive to light. Extraocular muscles intact. No scleral icterus.  MOUTH: Moist mucosal membrane. Dentition intact. No abscess noted.  EAR, NOSE, THROAT: Clear without exudates. No external lesions.  NECK: Supple. No thyromegaly. No nodules. No JVD.  PULMONARY: Clear to ascultation, without wheeze rails or rhonci. No use of accessory muscles, Good respiratory effort. good air entry bilaterally CHEST: Nontender to palpation.  CARDIOVASCULAR: S1 and S2. Regular rate and rhythm. No murmurs, rubs, or gallops. No edema. Pedal pulses 2+ bilaterally.  GASTROINTESTINAL: Soft, nontender, nondistended. No masses. Positive bowel sounds. No hepatosplenomegaly.  MUSCULOSKELETAL: No swelling, clubbing, or edema. Range of motion full in all extremities.  NEUROLOGIC: Cranial nerves II through XII are intact. No gross focal neurological deficits. Sensation intact. Reflexes intact.  SKIN: No ulceration, lesions, rashes, or cyanosis. Skin warm and dry. Turgor intact.  PSYCHIATRIC: Mood, affect within normal limits. The patient is awake, alert and oriented x 3. Insight, judgment intact.    LABORATORY PANEL:   CBC  Recent Labs Lab  03/13/15 0025  WBC 13.6*  HGB 16.8  HCT 49.5  PLT 223   ------------------------------------------------------------------------------------------------------------------  Chemistries   Recent Labs Lab 03/13/15 0025  NA 133*  K 4.0  CL 98*  CO2 24  GLUCOSE 311*  BUN 20  CREATININE 1.40*  CALCIUM 10.1   ------------------------------------------------------------------------------------------------------------------  Cardiac Enzymes  Recent Labs Lab 03/13/15 0025  TROPONINI <0.03   ------------------------------------------------------------------------------------------------------------------  RADIOLOGY:  Dg Chest Portable 1 View  03/13/2015   CLINICAL DATA:  Vomiting, mid chest pain beginning this morning. Evaluate acute myocardial infarction. History of hypertension, coronary artery disease, hyperlipidemia, smoker, diabetes.  EXAM: PORTABLE CHEST 1 VIEW  COMPARISON:  Chest radiograph Oct 24, 2013  FINDINGS: Cardiomediastinal silhouette is normal, status post median sternotomy for CABG. Similar mild chronic interstitial changes without pleural effusion or focal consolidation. Similar biapical pleural thickening. No pneumothorax. ACDF partially characterized. Screws within LEFT humeral head partially imaged.  IMPRESSION: Stable chronic bronchitic changes.   Electronically Signed   By: Thana Farr.D.  On: 03/13/2015 00:41   Ct Angio Chest Aorta W/cm &/or Wo/cm  03/13/2015   CLINICAL DATA:  Chest and abdominal pain.  EXAM: CT ANGIOGRAPHY CHEST, ABDOMEN AND PELVIS  TECHNIQUE: Multidetector CT imaging through the chest, abdomen and pelvis was performed using the standard protocol during bolus administration of intravenous contrast. Multiplanar reconstructed images and MIPs were obtained and reviewed to evaluate the vascular anatomy.  CONTRAST:  53mL OMNIPAQUE IOHEXOL 350 MG/ML SOLN  COMPARISON:  Chest CT 10/22/2013  FINDINGS: CTA CHEST FINDINGS  THORACIC INLET/BODY WALL:   No acute abnormality.  MEDIASTINUM:  Normal heart size. No pericardial effusion. Extensive atherosclerosis, status post coronary artery bypass. The lower venous graft and the LIMA graft are diminutive and typical to visualize distally on this nongated study. No evidence of aortic intramural hematoma, dissection, or other acute aortic syndrome. No aortic aneurysm. No visualized pulmonary embolism.  LUNG WINDOWS:  No consolidation. No effusion. Diffuse bronchial wall thickening, likely related to patient's history of smoking.  OSSEOUS:  No acute fracture.  No suspicious lytic or blastic lesions.  Review of the MIP images confirms the above findings.  CTA ABDOMEN AND PELVIS FINDINGS  Hepatobiliary: Hepatic steatosis. No focal liver abnormality.No evidence of biliary obstruction or stone.  Pancreas: Unremarkable.  Spleen: Unremarkable.  Adrenals/Urinary Tract: Negative adrenals. 11 mm right nephrolithiasis. No hydronephrosis or ureteral stone. The left ureter is distorted, medialized near the retroperitoneal surgical changes, but not dilated or thickened. Unremarkable bladder.  Reproductive:No pathologic findings.  Stomach/Bowel: Fluid dilated stomach and duodenum without obstructive process. No appendicitis.  Vascular/Lymphatic: Separate origin hepatic artery, otherwise standard aortic branching. There is diffuse atherosclerosis, predominately noncalcified, without flow limiting visceral stenosis. Status post stenting of the right common iliac artery, widely patent. There is also surgical change near the right common femoral artery. No evidence of dissection or aneurysm. No inflammatory changes.  No mass or adenopathy.  Peritoneal: No ascites or pneumoperitoneum.  Musculoskeletal: L3-4 and L4-5 anterior lumbar interbody fusion, accounting for retroperitoneal surgical clips. Bone harvest site from the left iliac crest. 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  Review of the MIP images confirms the  above findings.  IMPRESSION: 1. No acute arterial finding. 2. Fluid filled stomach and duodenum without obstructive process. 3. Hepatic steatosis and right nephrolithiasis.   Electronically Signed   By: Monte Fantasia M.D.   On: 03/13/2015 01:13   Ct Angio Abd/pel W/ And/or W/o  03/13/2015   CLINICAL DATA:  Chest and abdominal pain.  EXAM: CT ANGIOGRAPHY CHEST, ABDOMEN AND PELVIS  TECHNIQUE: Multidetector CT imaging through the chest, abdomen and pelvis was performed using the standard protocol during bolus administration of intravenous contrast. Multiplanar reconstructed images and MIPs were obtained and reviewed to evaluate the vascular anatomy.  CONTRAST:  53mL OMNIPAQUE IOHEXOL 350 MG/ML SOLN  COMPARISON:  Chest CT 10/22/2013  FINDINGS: CTA CHEST FINDINGS  THORACIC INLET/BODY WALL:  No acute abnormality.  MEDIASTINUM:  Normal heart size. No pericardial effusion. Extensive atherosclerosis, status post coronary artery bypass. The lower venous graft and the LIMA graft are diminutive and typical to visualize distally on this nongated study. No evidence of aortic intramural hematoma, dissection, or other acute aortic syndrome. No aortic aneurysm. No visualized pulmonary embolism.  LUNG WINDOWS:  No consolidation. No effusion. Diffuse bronchial wall thickening, likely related to patient's history of smoking.  OSSEOUS:  No acute fracture.  No suspicious lytic or blastic lesions.  Review of the MIP images confirms the above findings.  CTA ABDOMEN  AND PELVIS FINDINGS  Hepatobiliary: Hepatic steatosis. No focal liver abnormality.No evidence of biliary obstruction or stone.  Pancreas: Unremarkable.  Spleen: Unremarkable.  Adrenals/Urinary Tract: Negative adrenals. 11 mm right nephrolithiasis. No hydronephrosis or ureteral stone. The left ureter is distorted, medialized near the retroperitoneal surgical changes, but not dilated or thickened. Unremarkable bladder.  Reproductive:No pathologic findings.  Stomach/Bowel:  Fluid dilated stomach and duodenum without obstructive process. No appendicitis.  Vascular/Lymphatic: Separate origin hepatic artery, otherwise standard aortic branching. There is diffuse atherosclerosis, predominately noncalcified, without flow limiting visceral stenosis. Status post stenting of the right common iliac artery, widely patent. There is also surgical change near the right common femoral artery. No evidence of dissection or aneurysm. No inflammatory changes.  No mass or adenopathy.  Peritoneal: No ascites or pneumoperitoneum.  Musculoskeletal: L3-4 and L4-5 anterior lumbar interbody fusion, accounting for retroperitoneal surgical clips. Bone harvest site from the left iliac crest. 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  Review of the MIP images confirms the above findings.  IMPRESSION: 1. No acute arterial finding. 2. Fluid filled stomach and duodenum without obstructive process. 3. Hepatic steatosis and right nephrolithiasis.   Electronically Signed   By: Monte Fantasia M.D.   On: 03/13/2015 01:13    EKG:   Orders placed or performed during the hospital encounter of 03/13/15  . EKG 12-Lead  . EKG 12-Lead  . EKG 12-Lead  . EKG 12-Lead  . ED EKG within 10 minutes  . ED EKG within 10 minutes  . ED EKG  . ED EKG    IMPRESSION AND PLAN:   69 year old Caucasian gentleman history of coronary artery disease status post bypass surgery presenting with chest pain.  1. Chest pain, central: Initiate aspirin/statin therapy, trend cardiac enzymes 3, place on telemetry 2. Hypertensive urgency: Markably elevated blood pressure upon arrival, which is somewhat improved. Add when necessary Hydralazine continue home medications 3. Gastroenteritis: Supportive care 4. GERD without esophagitis: PPI therapy 5. Venous thrombosis prophylactic: Lovenox    All the records are reviewed and case discussed with ED provider. Management plans discussed with the patient, family and  they are in agreement.  CODE STATUS: Full  TOTAL TIME TAKING CARE OF THIS PATIENT: 35 minutes.    Hower,  Karenann Cai.D on 03/13/2015 at 3:13 AM  Between 7am to 6pm - Pager - [phone]  After 6pm: House Pager: - [phone]  [facility]  Office  [phone]  CC: Primary care physician; No primary care provider on file.

## 2015-03-13 NOTE — ED Provider Notes (Signed)
[facility] Emergency Department Bryan Scott Note  ____________________________________________  Time seen: 12:15 AM  I have reviewed the triage vital signs and the nursing notes.   HISTORY  Chief Complaint Chest Pain    HPI Bryan Scott is a 69 y.o. male presents actively vomiting,. Patient states 10 out of 10 central chest pain and midline abdominal pain since yesterday accompanied by nausea vomiting and diarrhea. Patient's EKG in triage stated acute MI with ST segment elevation in 3 and aVF. Of note patient has a history of an MI in the past status post CABG   Past Medical History  Diagnosis Date  . Hypertension   . GERD (gastroesophageal reflux disease)   . Chronic pain syndrome   . Hyperlipidemia   . Coronary artery disease     s/p CABG ~ 2007  . History of tobacco use   . Type 2 diabetes mellitus     A1C 6.8% in 01/2013  . Carotid artery disease     > 75% bilateral ICA stenoses on 01/2013 CT  . 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 Heparin allergy     Bleeding  . Paroxysmal atrial fibrillation     On Xarelto anticoagulation  . Cancer   . CHF (congestive heart failure)     Patient Active Problem List   Diagnosis Date Noted  . Chest pain, central 03/13/2015  . Hypertensive urgency 03/13/2015    Past Surgical History  Procedure Laterality Date  . Cardiac catheterization    . Coronary artery bypass graft  2007    No current outpatient prescriptions on file.  Allergies Heparin and Lisinopril  Family History  Problem Relation Age of Onset  . Seizures Father 23    Deceased from complications with seizures  . Heart disease Mother 87    Deceased    Social History Social History  Substance Use Topics  . Smoking status: Former Smoker -- 0.00 packs/day    Types: Cigarettes  . Smokeless tobacco: None     Comment: 30 pack-year history. Quit 8-9 years ago.   . Alcohol Use: No    Review of Systems  Constitutional: Negative  for fever. Eyes: Negative for visual changes. ENT: Negative for sore throat. Cardiovascular: Negative for chest pain. Respiratory: Negative for shortness of breath. Gastrointestinal: Positive for abdominal pain, vomiting and diarrhea. Genitourinary: Negative for dysuria. Musculoskeletal: Negative for back pain. Skin: Negative for rash. Neurological: Negative for headaches, focal weakness or numbness.   10-point ROS otherwise negative.  ____________________________________________   PHYSICAL EXAM:  VITAL SIGNS: ED Triage Vitals  Enc Vitals Group     BP 03/13/15 0017 245/120 mmHg     Pulse Rate 03/13/15 0017 84     Resp 03/13/15 0017 26     Temp 03/13/15 0505 97.9 F (36.6 C)     Temp Source 03/13/15 0505 Oral     SpO2 03/13/15 0017 98 %     Weight 03/13/15 0017 218 lb 4.1 oz (99 kg)     Height 03/13/15 0104 6' (1.829 m)     Head Cir --      Peak Flow --      Pain Score 03/13/15 0021 10     Pain Loc --      Pain Edu? --      Excl. in [place]? --      Constitutional: Alert and oriented. Apparent distress actively vomiting Eyes: Conjunctivae are normal. PERRL. Normal extraocular movements. ENT  Head: Normocephalic and atraumatic.   Nose: No congestion/rhinnorhea.   Mouth/Throat: Mucous membranes are moist.   Neck: No stridor. Hematological/Lymphatic/Immunilogical: No cervical lymphadenopathy. Cardiovascular: Normal rate, regular rhythm. Normal and symmetric distal pulses are present in all extremities. No murmurs, rubs, or gallops. Respiratory: Normal respiratory effort without tachypnea nor retractions. Breath sounds are clear and equal bilaterally. No wheezes/rales/rhonchi. Gastrointestinal: Generalized tenderness to palpation. No distention. There is no CVA tenderness. Genitourinary: deferred Musculoskeletal: Nontender with normal range of motion in all extremities. No joint effusions.  No lower extremity tenderness nor edema. Neurologic:  Normal speech and  language. No gross focal neurologic deficits are appreciated. Speech is normal.  Skin:  Skin is warm, dry and intact. No rash noted. Psychiatric: Mood and affect are normal. Speech and behavior are normal. Patient exhibits appropriate insight and judgment.  ____________________________________________    LABS (pertinent positives/negatives)  Labs Reviewed  BASIC METABOLIC PANEL - Abnormal; Notable for the following:    Sodium 133 (*)    Chloride 98 (*)    Glucose, Bld 311 (*)    Creatinine, Ser 1.40 (*)    GFR calc non Af Amer 50 (*)    GFR calc Af Amer 58 (*)    All other components within normal limits  CBC - Abnormal; Notable for the following:    WBC 13.6 (*)    All other components within normal limits  TROPONIN I  CBC  COMPREHENSIVE METABOLIC PANEL  TROPONIN I  TROPONIN I  TROPONIN I     ____________________________________________   EKG  ED ECG REPORT I, BROWN, [place] N, the attending physician, personally viewed and interpreted this ECG.   Date: 03/13/2015  EKG Time: 12:14 AM  Rate: 78  Rhythm: Normal sinus rhythm with premature ventricular contractions  Axis: None  Intervals: Normal  ST&T Change: ST segment elevation 3 and aVF with a on 6 morphology.   ____________________________________________    RADIOLOGY  CTA ABDOMEN AND PELVIS FINDINGS  Hepatobiliary: Hepatic steatosis. No focal liver abnormality.No evidence of biliary obstruction or stone.  Pancreas: Unremarkable.  Spleen: Unremarkable.  Adrenals/Urinary Tract: Negative adrenals. 11 mm right nephrolithiasis. No hydronephrosis or ureteral stone. The left ureter is distorted, medialized near the retroperitoneal surgical changes, but not dilated or thickened. Unremarkable bladder.  Reproductive:No pathologic findings.  Stomach/Bowel: Fluid dilated stomach and duodenum without obstructive process. No appendicitis.  Vascular/Lymphatic: Separate origin hepatic artery,  otherwise standard aortic branching. There is diffuse atherosclerosis, predominately noncalcified, without flow limiting visceral stenosis. Status post stenting of the right common iliac artery, widely patent. There is also surgical change near the right common femoral artery. No evidence of dissection or aneurysm. No inflammatory changes.  No mass or adenopathy.  Peritoneal: No ascites or pneumoperitoneum.  Musculoskeletal: L3-4 and L4-5 anterior lumbar interbody fusion, accounting for retroperitoneal surgical clips. Bone harvest site from the left iliac crest. 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  Review of the MIP images confirms the above findings.  IMPRESSION: 1. No acute arterial finding. 2. Fluid filled stomach and duodenum without obstructive process. 3. Hepatic steatosis and right nephrolithiasis.   Electronically Signed By: Monte Fantasia M.D. On: 03/13/2015 01:13          DG Chest Portable 1 View (Final result) Result time: 03/13/15 00:41:19   Final result by Rad Results In Interface (03/13/15 00:41:19)   Narrative:   CLINICAL DATA: Vomiting, mid chest pain beginning this morning. Evaluate acute myocardial infarction. History of hypertension, coronary artery disease, hyperlipidemia, smoker, diabetes.  EXAM: PORTABLE CHEST 1 VIEW  COMPARISON: Chest radiograph Oct 24, 2013  FINDINGS: Cardiomediastinal silhouette is normal, status post median sternotomy for CABG. Similar mild chronic interstitial changes without pleural effusion or focal consolidation. Similar biapical pleural thickening. No pneumothorax. ACDF partially characterized. Screws within LEFT humeral head partially imaged.  IMPRESSION: Stable chronic bronchitic changes.   Electronically Signed By: Elon Alas M.D. On: 03/13/2015 00:41            INITIAL IMPRESSION / ASSESSMENT AND PLAN / ED COURSE  Pertinent labs & imaging results that  were available during my care of the patient were reviewed by me and considered in my medical decision making (see chart for details).   Patient discussed with Dr. Martinique cardiologist on call at [facility] who reviewed the EKGs and agreed that this was not a STEMI. Patient received IV morphine and Zofran with complete resolution of abdominal pain and nausea no further emesis while patient was in the emergency department. CT scan of the abdomen and pelvis revealed no clear etiology of the patient's abdominal pain. However given the patient's considerable cardiac history and ongoing chest discomfort will admit the patient to the hospital for further cardiac evaluation. Patient discussed with Dr. Marcille Blanco for hospital admission.  ____________________________________________   FINAL CLINICAL IMPRESSION(S) / ED DIAGNOSES  Final diagnoses:  Hypertension  Abdominal pain  Chest pain      Gregor Hams, MD 03/13/15 [phone]

## 2015-03-13 NOTE — ED Notes (Signed)
Patient transported to CT [facility]

## 2015-03-13 NOTE — ED Notes (Signed)
Pt returned to room  

## 2015-03-13 NOTE — Progress Notes (Signed)
[facility] NAME: Bryan Scott    MR#:  [id number]  DATE OF BIRTH:  1945/07/13  SUBJECTIVE:  CHIEF COMPLAINT:  Patient is reporting watery diarrhea associated with nausea. Intermittent episodes of stomach cramps but denies chest pain at this time. He thinks it is food poisoning after he had some food from an outside [place]:  CONSTITUTIONAL: No fever, fatigue or weakness.  EYES: No blurred or double vision.  EARS, NOSE, AND THROAT: No tinnitus or ear pain.  RESPIRATORY: No cough, shortness of breath, wheezing or hemoptysis.  CARDIOVASCULAR: No chest pain, orthopnea, edema.  GASTROINTESTINAL: Reports nausea, diarrhea or abdominal pain. Denies any vomiting GENITOURINARY: No dysuria, hematuria.  ENDOCRINE: No polyuria, nocturia,  HEMATOLOGY: No anemia, easy bruising or bleeding SKIN: No rash or lesion. MUSCULOSKELETAL: No joint pain or arthritis.   NEUROLOGIC: No tingling, numbness, weakness.  PSYCHIATRY: No anxiety or depression.   DRUG ALLERGIES:   Allergies  Allergen Reactions  . Heparin Other (See Comments)    bleeding  . Lisinopril Swelling    VITALS:  Blood pressure 129/72, pulse 56, temperature 98 F (36.7 C), temperature source Oral, resp. rate 16, height 6' (1.829 m), weight 98.93 kg (218 lb 1.6 oz), SpO2 100 %.  PHYSICAL EXAMINATION:  GENERAL:  69 y.o.-year-old patient lying in the bed with no acute distress.  EYES: Pupils equal, round, reactive to light and accommodation. No scleral icterus. Extraocular muscles intact.  HEENT: Head atraumatic, normocephalic. Oropharynx and nasopharynx clear.  NECK:  Supple, no jugular venous distention. No thyroid enlargement, no tenderness.  LUNGS: Normal breath sounds bilaterally, no wheezing, rales,rhonchi or crepitation. No use of accessory muscles of respiration.  CARDIOVASCULAR: S1, S2 normal. No murmurs, rubs, or gallops.  ABDOMEN: Soft,  nontender, nondistended. Hyperactive bowel sounds present. No organomegaly or mass.  EXTREMITIES: No pedal edema, cyanosis, or clubbing.  NEUROLOGIC: Cranial nerves II through XII are intact. Muscle strength 5/5 in all extremities. Sensation intact. Gait not checked.  PSYCHIATRIC: The patient is alert and oriented x 3.  SKIN: No obvious rash, lesion, or ulcer.    LABORATORY PANEL:   CBC  Recent Labs Lab 03/13/15 0722  WBC 14.9*  HGB 15.3  HCT 43.7  PLT 190   ------------------------------------------------------------------------------------------------------------------  Chemistries   Recent Labs Lab 03/13/15 0722  NA 134*  K 4.1  CL 104  CO2 20*  GLUCOSE 218*  BUN 16  CREATININE 1.21  CALCIUM 9.0  AST 31  ALT 31  ALKPHOS 68  BILITOT 0.7   ------------------------------------------------------------------------------------------------------------------  Cardiac Enzymes  Recent Labs Lab 03/13/15 1301  TROPONINI <0.03   ------------------------------------------------------------------------------------------------------------------  RADIOLOGY:  Dg Chest Portable 1 View  03/13/2015   CLINICAL DATA:  Vomiting, mid chest pain beginning this morning. Evaluate acute myocardial infarction. History of hypertension, coronary artery disease, hyperlipidemia, smoker, diabetes.  EXAM: PORTABLE CHEST 1 VIEW  COMPARISON:  Chest radiograph Oct 24, 2013  FINDINGS: Cardiomediastinal silhouette is normal, status post median sternotomy for CABG. Similar mild chronic interstitial changes without pleural effusion or focal consolidation. Similar biapical pleural thickening. No pneumothorax. ACDF partially characterized. Screws within LEFT humeral head partially imaged.  IMPRESSION: Stable chronic bronchitic changes.   Electronically Signed   By: Elon Alas M.D.   On: 03/13/2015 00:41   Ct Angio Chest Aorta W/cm &/or Wo/cm  03/13/2015   CLINICAL DATA:  Chest and abdominal pain.   EXAM: CT ANGIOGRAPHY CHEST, ABDOMEN AND PELVIS  TECHNIQUE:  Multidetector CT imaging through the chest, abdomen and pelvis was performed using the standard protocol during bolus administration of intravenous contrast. Multiplanar reconstructed images and MIPs were obtained and reviewed to evaluate the vascular anatomy.  CONTRAST:  28mL OMNIPAQUE IOHEXOL 350 MG/ML SOLN  COMPARISON:  Chest CT 10/22/2013  FINDINGS: CTA CHEST FINDINGS  THORACIC INLET/BODY WALL:  No acute abnormality.  MEDIASTINUM:  Normal heart size. No pericardial effusion. Extensive atherosclerosis, status post coronary artery bypass. The lower venous graft and the LIMA graft are diminutive and typical to visualize distally on this nongated study. No evidence of aortic intramural hematoma, dissection, or other acute aortic syndrome. No aortic aneurysm. No visualized pulmonary embolism.  LUNG WINDOWS:  No consolidation. No effusion. Diffuse bronchial wall thickening, likely related to patient's history of smoking.  OSSEOUS:  No acute fracture.  No suspicious lytic or blastic lesions.  Review of the MIP images confirms the above findings.  CTA ABDOMEN AND PELVIS FINDINGS  Hepatobiliary: Hepatic steatosis. No focal liver abnormality.No evidence of biliary obstruction or stone.  Pancreas: Unremarkable.  Spleen: Unremarkable.  Adrenals/Urinary Tract: Negative adrenals. 11 mm right nephrolithiasis. No hydronephrosis or ureteral stone. The left ureter is distorted, medialized near the retroperitoneal surgical changes, but not dilated or thickened. Unremarkable bladder.  Reproductive:No pathologic findings.  Stomach/Bowel: Fluid dilated stomach and duodenum without obstructive process. No appendicitis.  Vascular/Lymphatic: Separate origin hepatic artery, otherwise standard aortic branching. There is diffuse atherosclerosis, predominately noncalcified, without flow limiting visceral stenosis. Status post stenting of the right common iliac artery, widely  patent. There is also surgical change near the right common femoral artery. No evidence of dissection or aneurysm. No inflammatory changes.  No mass or adenopathy.  Peritoneal: No ascites or pneumoperitoneum.  Musculoskeletal: L3-4 and L4-5 anterior lumbar interbody fusion, accounting for retroperitoneal surgical clips. Bone harvest site from the left iliac crest. 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  Review of the MIP images confirms the above findings.  IMPRESSION: 1. No acute arterial finding. 2. Fluid filled stomach and duodenum without obstructive process. 3. Hepatic steatosis and right nephrolithiasis.   Electronically Signed   By: Monte Fantasia M.D.   On: 03/13/2015 01:13   Ct Angio Abd/pel W/ And/or W/o  03/13/2015   CLINICAL DATA:  Chest and abdominal pain.  EXAM: CT ANGIOGRAPHY CHEST, ABDOMEN AND PELVIS  TECHNIQUE: Multidetector CT imaging through the chest, abdomen and pelvis was performed using the standard protocol during bolus administration of intravenous contrast. Multiplanar reconstructed images and MIPs were obtained and reviewed to evaluate the vascular anatomy.  CONTRAST:  80mL OMNIPAQUE IOHEXOL 350 MG/ML SOLN  COMPARISON:  Chest CT 10/22/2013  FINDINGS: CTA CHEST FINDINGS  THORACIC INLET/BODY WALL:  No acute abnormality.  MEDIASTINUM:  Normal heart size. No pericardial effusion. Extensive atherosclerosis, status post coronary artery bypass. The lower venous graft and the LIMA graft are diminutive and typical to visualize distally on this nongated study. No evidence of aortic intramural hematoma, dissection, or other acute aortic syndrome. No aortic aneurysm. No visualized pulmonary embolism.  LUNG WINDOWS:  No consolidation. No effusion. Diffuse bronchial wall thickening, likely related to patient's history of smoking.  OSSEOUS:  No acute fracture.  No suspicious lytic or blastic lesions.  Review of the MIP images confirms the above findings.  CTA ABDOMEN AND PELVIS  FINDINGS  Hepatobiliary: Hepatic steatosis. No focal liver abnormality.No evidence of biliary obstruction or stone.  Pancreas: Unremarkable.  Spleen: Unremarkable.  Adrenals/Urinary Tract: Negative adrenals. 11 mm right nephrolithiasis. No  hydronephrosis or ureteral stone. The left ureter is distorted, medialized near the retroperitoneal surgical changes, but not dilated or thickened. Unremarkable bladder.  Reproductive:No pathologic findings.  Stomach/Bowel: Fluid dilated stomach and duodenum without obstructive process. No appendicitis.  Vascular/Lymphatic: Separate origin hepatic artery, otherwise standard aortic branching. There is diffuse atherosclerosis, predominately noncalcified, without flow limiting visceral stenosis. Status post stenting of the right common iliac artery, widely patent. There is also surgical change near the right common femoral artery. No evidence of dissection or aneurysm. No inflammatory changes.  No mass or adenopathy.  Peritoneal: No ascites or pneumoperitoneum.  Musculoskeletal: L3-4 and L4-5 anterior lumbar interbody fusion, accounting for retroperitoneal surgical clips. Bone harvest site from the left iliac crest. 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  Review of the MIP images confirms the above findings.  IMPRESSION: 1. No acute arterial finding. 2. Fluid filled stomach and duodenum without obstructive process. 3. Hepatic steatosis and right nephrolithiasis.   Electronically Signed   By: Monte Fantasia M.D.   On: 03/13/2015 01:13    EKG:   Orders placed or performed during the hospital encounter of 03/13/15  . EKG 12-Lead  . EKG 12-Lead  . EKG 12-Lead  . EKG 12-Lead  . ED EKG within 10 minutes  . ED EKG within 10 minutes  . ED EKG  . ED EKG    ASSESSMENT AND PLAN:    69 year old Caucasian gentleman history of coronary artery disease status post bypass surgery presenting with chest pain.  1. Chest pain, central: Very unlikely from acute  MI/could be from problem #2.  Initiate aspirin/statin therapy, cardiac enzymes 2 are negative so far., Monitor on telemetry 2. Hypertensive urgency: Markably elevated blood pressure  Metoprolol 25 mg by mouth twice a day is added to the regimen, we will uptitrate as needed basis . Add when necessary Hydralazine continue home medications 3. Watery diarrhea - will check stool for C. difficile toxin and occult blood .patient will be on enteric precautions at this time and provide Supportive care 4. GERD without esophagitis: PPI therapy 5. Venous thrombosis prophylactic: Lovenox    All the records are reviewed and case discussed with Care Management/Social Workerr. Management plans discussed with the patient, family and they are in agreement.  CODE STATUS: Full code  TOTAL TIME TAKING CARE OF THIS PATIENT: 35 minutes.   POSSIBLE D/C IN 1-2 DAYS, DEPENDING ON CLINICAL CONDITION.   Nicholes Mango M.D on 03/13/2015 at 1:57 PM  Between 7am to 6pm - Pager - [phone] After 6pm go to [email or web address] - password [email or web address] [facility]  [facility]  Office  [phone]  CC: Primary care physician; No primary care provider on file.

## 2015-03-13 NOTE — Progress Notes (Signed)
Inpatient Diabetes Program Recommendations  AACE/ADA: New Consensus Statement on Inpatient Glycemic Control (2015)  Target Ranges:  Prepandial:   less than 140 mg/dL      Peak postprandial:   less than 180 mg/dL (1-2 hours)      Critically ill patients:  140 - 180 mg/dL   Review of Glycemic Control:  Results for TREMON, SAINVIL (MRN [id number]) as of 03/13/2015 10:10  Ref. Range 03/13/2015 00:25 03/13/2015 07:22  Glucose Latest Ref Range: 65-99 mg/dL 311 (H) 218 (H)   Diabetes history: Type 2 diabetes Outpatient Diabetes medications: No meds. listed Current orders for Inpatient glycemic control:  None  Inpatient Diabetes Program Recommendations:   Please consider ordering Novolog moderate tid with meals and HS due to history of diabetes and blood sugars increased.  Also please consider ordering A1C to determine glycemic control.   Thanks, Adah Perl, RN, BC-ADM Inpatient Diabetes Coordinator Pager [phone] (8a-5p)

## 2015-03-13 NOTE — Progress Notes (Signed)
Spoke with dr. Margaretmary Eddy to make aware second troponin increased to 0.05. md on floor acknowledged, no new orders received. Will continue to monitor [employer]

## 2015-03-13 NOTE — ED Notes (Signed)
Patient  X-ray at bedside 

## 2015-03-13 NOTE — ED Notes (Signed)
Pt to room 16 via w/c actively vomiting; EKG in triage indicates ACUTE MI; pt c/o mid CP since this morning, worse tonight; st pain accomp by N/V/D

## 2015-03-14 DIAGNOSIS — R1084 Generalized abdominal pain: Secondary | ICD-10-CM

## 2015-03-14 DIAGNOSIS — R079 Chest pain, unspecified: Secondary | ICD-10-CM | POA: Diagnosis not present

## 2015-03-14 DIAGNOSIS — R109 Unspecified abdominal pain: Secondary | ICD-10-CM | POA: Insufficient documentation

## 2015-03-14 LAB — LIPID PANEL
Cholesterol: 141 mg/dL (ref 0–200)
HDL: 36 mg/dL — ABNORMAL LOW (ref >40)
LDL CALC: 63 mg/dL (ref 0–99)
Total CHOL/HDL Ratio: 3.9 RATIO
Triglycerides: 211 mg/dL — ABNORMAL HIGH (ref <150)
VLDL: 42 mg/dL — ABNORMAL HIGH (ref 0–40)

## 2015-03-14 LAB — BASIC METABOLIC PANEL
ANION GAP: 9 (ref 5–15)
BUN: 14 mg/dL (ref 6–20)
CALCIUM: 8.7 mg/dL — AB (ref 8.9–10.3)
CHLORIDE: 104 mmol/L (ref 101–111)
CO2: 22 mmol/L (ref 22–32)
Creatinine, Ser: 1.04 mg/dL (ref 0.61–1.24)
GFR calc non Af Amer: >60 mL/min (ref >60)
Glucose, Bld: 169 mg/dL — ABNORMAL HIGH (ref 65–99)
POTASSIUM: 3.7 mmol/L (ref 3.5–5.1)
Sodium: 135 mmol/L (ref 135–145)

## 2015-03-14 LAB — CBC
HEMATOCRIT: 41.1 % (ref 40.0–52.0)
HEMOGLOBIN: 14 g/dL (ref 13.0–18.0)
MCH: 31.8 pg (ref 26.0–34.0)
MCHC: 34.1 g/dL (ref 32.0–36.0)
MCV: 93.3 fL (ref 80.0–100.0)
Platelets: 155 10*3/uL (ref 150–440)
RBC: 4.41 MIL/uL (ref 4.40–5.90)
RDW: 13.7 % (ref 11.5–14.5)
WBC: 10.7 10*3/uL — AB (ref 3.8–10.6)

## 2015-03-14 LAB — GLUCOSE, CAPILLARY
GLUCOSE-CAPILLARY: 242 mg/dL — AB (ref 65–99)
GLUCOSE-CAPILLARY: 196 mg/dL — AB (ref 65–99)
Glucose-Capillary: 215 mg/dL — ABNORMAL HIGH (ref 65–99)
Glucose-Capillary: 216 mg/dL — ABNORMAL HIGH (ref 65–99)

## 2015-03-14 LAB — HEMOGLOBIN A1C: HEMOGLOBIN A1C: 8 % — AB (ref 4.0–6.0)

## 2015-03-14 MED ORDER — CARVEDILOL 12.5 MG PO TABS
12.5000 mg | ORAL_TABLET | Freq: Once | ORAL | Status: AC
  Administered 2015-03-14: 12.5 mg via ORAL
  Filled 2015-03-14: qty 1

## 2015-03-14 MED ORDER — NITROGLYCERIN 2 % TD OINT
1.0000 [in_us] | TOPICAL_OINTMENT | Freq: Once | TRANSDERMAL | Status: AC
  Administered 2015-03-14: 1 [in_us] via TOPICAL
  Filled 2015-03-14: qty 1

## 2015-03-14 MED ORDER — METOPROLOL TARTRATE 50 MG PO TABS
50.0000 mg | ORAL_TABLET | Freq: Two times a day (BID) | ORAL | Status: DC
Start: 2015-03-14 — End: 2015-03-14

## 2015-03-14 MED ORDER — CLOPIDOGREL BISULFATE 75 MG PO TABS
75.0000 mg | ORAL_TABLET | Freq: Every day | ORAL | Status: DC
  Administered 2015-03-14 – 2015-03-16 (×3): 75 mg via ORAL
  Filled 2015-03-14 (×3): qty 1

## 2015-03-14 MED ORDER — SODIUM CHLORIDE 0.9 % IV BOLUS (SEPSIS)
500.0000 mL | INTRAVENOUS | Status: AC
  Administered 2015-03-14: 500 mL via INTRAVENOUS

## 2015-03-14 MED ORDER — LOPERAMIDE HCL 2 MG PO CAPS
2.0000 mg | ORAL_CAPSULE | Freq: Four times a day (QID) | ORAL | Status: DC | PRN
  Administered 2015-03-14: 2 mg via ORAL
  Filled 2015-03-14: qty 1

## 2015-03-14 MED ORDER — CLONIDINE HCL 0.1 MG PO TABS
0.2000 mg | ORAL_TABLET | Freq: Once | ORAL | Status: AC
  Administered 2015-03-14: 0.2 mg via ORAL
  Filled 2015-03-14: qty 2

## 2015-03-14 MED ORDER — CARVEDILOL 25 MG PO TABS
37.5000 mg | ORAL_TABLET | Freq: Two times a day (BID) | ORAL | Status: DC
  Administered 2015-03-15 – 2015-03-16 (×3): 37.5 mg via ORAL
  Filled 2015-03-14 (×3): qty 1

## 2015-03-14 MED ORDER — POTASSIUM CHLORIDE IN NACL 20-0.9 MEQ/L-% IV SOLN
INTRAVENOUS | Status: DC
  Administered 2015-03-14: 22:00:00 via INTRAVENOUS
  Filled 2015-03-14 (×2): qty 1000

## 2015-03-14 NOTE — Progress Notes (Signed)
Inpatient Diabetes Program Recommendations  AACE/ADA: New Consensus Statement on Inpatient Glycemic Control (2015)  Target Ranges:  Prepandial:   less than 140 mg/dL      Peak postprandial:   less than 180 mg/dL (1-2 hours)      Critically ill patients:  140 - 180 mg/dL   Review of Glycemic Control:  Results for TU, BAYLE (MRN [id number]) as of 03/14/2015 15:12  Ref. Range 03/13/2015 16:14 03/13/2015 20:46 03/14/2015 07:36 03/14/2015 11:33  Glucose-Capillary Latest Ref Range: 65-99 mg/dL 268 (H) 246 (H) 215 (H) 242 (H)    Diabetes history: Type 2 diabetes Outpatient Diabetes medications: Metformin 1000 mg bid Current orders for Inpatient glycemic control:  Novolog moderate tid with meals and HS  Inpatient Diabetes Program Recommendations:     May consider adding Levemir 20 units daily while patient is in the hospital.  A1C pending.  Will follow.  Thanks, Adah Perl, RN, BC-ADM Inpatient Diabetes Coordinator Pager [phone] (8a-5p)

## 2015-03-14 NOTE — Consult Note (Signed)
[facility]  [address], [place], [place] [address] Phone: [phone] Fax : [phone]  Consultation  Referring Provider:     No ref. provider found Primary Care Physician:  No primary care provider on file. Primary Gastroenterologist:  None         Reason for Consultation:     Heartburn  Date of Admission:  03/13/2015 Date of Consultation:  03/14/2015         HPI:   Bryan Scott is a 69 y.o. male who comes in with chest pain. The patient was evaluated by cardiology today and is set up for cardiac evaluation for tomorrow. The patient was found to have a low blood pressure and 95/53 today and given a bolus of fluid. The patient reports that he had eaten out in shortly after that had nausea vomiting with diarrhea. He states that his stomach is been through a lot. The patient now reports that the diarrhea has slowed down although he still has some mildly loose stools. He also reports that he has not vomited since admission. He still has some soreness in his stomach due to the vomiting. He also reports that he has heartburn with a bad taste in his mouth with intermittent substernal chest pain. He did have a upper endoscopy in the past by Dr. Rayann Heman but he is not sure what it showed. He also reports that he has never had a colonoscopy because he has refused in the past. It is no report of any unexplained weight loss. He also denies any black stools or bloody stools. The patient's cardiac enzymes have been negative. He also has a history of coronary artery bypass grafts.  Past Medical History  Diagnosis Date  . Hypertension   . GERD (gastroesophageal reflux disease)   . Chronic pain syndrome   . Hyperlipidemia   . Coronary artery disease     s/p CABG ~ 2007  . History of tobacco use   . Type 2 diabetes mellitus     A1C 6.8% in 01/2013  . Carotid artery disease     > 75% bilateral ICA stenoses on 01/2013 CT  . 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 Heparin allergy      Bleeding  . Paroxysmal atrial fibrillation     On Xarelto anticoagulation  . Cancer   . CHF (congestive heart failure)     Past Surgical History  Procedure Laterality Date  . Cardiac catheterization    . Coronary artery bypass graft  2007    Prior to Admission medications   Medication Sig Start Date End Date Taking? Authorizing Provider  acetaminophen (TYLENOL) 325 MG tablet Take 975 mg by mouth 3 (three) times daily as needed for mild pain or fever.   Yes Historical Provider, MD  aspirin 81 MG chewable tablet Chew 81 mg by mouth daily.   Yes Historical Provider, MD  atorvastatin (LIPITOR) 80 MG tablet Take 40 mg by mouth at bedtime.   Yes Historical Provider, MD  carvedilol (COREG) 12.5 MG tablet Take 37.5 mg by mouth every 12 (twelve) hours.   Yes Historical Provider, MD  clopidogrel (PLAVIX) 75 MG tablet Take 75 mg by mouth daily.   Yes Historical Provider, MD  hydrochlorothiazide (HYDRODIURIL) 25 MG tablet Take 25 mg by mouth daily.   Yes Historical Provider, MD  magnesium oxide (MAG-OX) 400 MG tablet Take 400 mg by mouth 2 (two) times daily.    Yes Historical Provider, MD  metFORMIN (GLUCOPHAGE) 1000 MG tablet Take 1,000 mg by mouth 2 (two) times daily.   Yes Historical Provider, MD  morphine (MS CONTIN) 15 MG 12 hr tablet Take 15 mg by mouth every 8 (eight) hours.   Yes Historical Provider, MD  ondansetron (ZOFRAN) 8 MG tablet Take 8 mg by mouth every 8 (eight) hours as needed for nausea or vomiting.    Yes Historical Provider, MD  pantoprazole (PROTONIX) 40 MG tablet Take 40 mg by mouth daily.   Yes Historical Provider, MD    Family History  Problem Relation Age of Onset  . Seizures Father 65    Deceased from complications with seizures  . Heart disease Mother 75    Deceased     Social History  Substance Use Topics  . Smoking status: Former Smoker -- 0.00 packs/day    Types: Cigarettes  . Smokeless tobacco: None     Comment: 30 pack-year history. Quit 8-9 years  ago.   . Alcohol Use: No    Allergies as of 03/13/2015 - Review Complete 03/13/2015  Allergen Reaction Noted  . Heparin Other (See Comments) 07/19/2012  . Lisinopril Swelling 07/19/2012    Review of Systems:    All systems reviewed and negative except where noted in HPI.   Physical Exam:  Vital signs in last 24 hours: Temp:  [97.4 F (36.3 C)-98.2 F (36.8 C)] 97.4 F (36.3 C) (09/28 1132) Pulse Rate:  [64-89] 64 (09/28 1425) Resp:  [18-22] 22 (09/28 1132) BP: (95-233)/(53-115) 95/53 mmHg (09/28 1425) SpO2:  [89 %-97 %] 97 % (09/28 1425) Last BM Date: 03/13/15 General:   Pleasant, cooperative in NAD Head:  Normocephalic and atraumatic. Eyes:   No icterus.   Conjunctiva Bryan. PERRLA. Ears:  Normal auditory acuity. Neck:  Supple; no masses or thyroidomegaly Lungs: Respirations even and unlabored. Lungs clear to auscultation bilaterally.   No wheezes, crackles, or rhonchi.  Heart:  Regular rate and rhythm;  Without murmur, clicks, rubs or gallops Abdomen:  Soft, nondistended, nontender. Normal bowel sounds. No appreciable masses or hepatomegaly.  No rebound or guarding.  Rectal:  Not performed. Msk:  Symmetrical without gross deformities.  Strength  Extremities:  Without edema, cyanosis or clubbing. Neurologic:  Alert and oriented x3;  grossly normal neurologically. Skin:  Intact without significant lesions or rashes. Cervical Nodes:  No significant cervical adenopathy. Psych:  Alert and cooperative. Normal affect.  LAB RESULTS:  Recent Labs  03/13/15 0025 03/13/15 0722 03/14/15 0438  WBC 13.6* 14.9* 10.7*  HGB 16.8 15.3 14.0  HCT 49.5 43.7 41.1  PLT 223 190 155   BMET  Recent Labs  03/13/15 0025 03/13/15 0722 03/14/15 0438  NA 133* 134* 135  K 4.0 4.1 3.7  CL 98* 104 104  CO2 24 20* 22  GLUCOSE 311* 218* 169*  BUN 20 16 14   CREATININE 1.40* 1.21 1.04  CALCIUM 10.1 9.0 8.7*   LFT  Recent Labs  03/13/15 0722  PROT 7.2  ALBUMIN 4.1  AST 31  ALT  31  ALKPHOS 68  BILITOT 0.7   PT/INR No results for input(s): LABPROT, INR in the last 72 hours.  STUDIES: Dg Chest Portable 1 View  03/13/2015   CLINICAL DATA:  Vomiting, mid chest pain beginning this morning. Evaluate acute myocardial infarction. History of hypertension, coronary artery disease, hyperlipidemia, smoker, diabetes.  EXAM: PORTABLE CHEST 1 VIEW  COMPARISON:  Chest radiograph Oct 24, 2013  FINDINGS: Cardiomediastinal silhouette is normal, status post median sternotomy for CABG.  Similar mild chronic interstitial changes without pleural effusion or focal consolidation. Similar biapical pleural thickening. No pneumothorax. ACDF partially characterized. Screws within LEFT humeral head partially imaged.  IMPRESSION: Stable chronic bronchitic changes.   Electronically Signed   By: Elon Alas M.D.   On: 03/13/2015 00:41   Ct Angio Chest Aorta W/cm &/or Wo/cm  03/13/2015   CLINICAL DATA:  Chest and abdominal pain.  EXAM: CT ANGIOGRAPHY CHEST, ABDOMEN AND PELVIS  TECHNIQUE: Multidetector CT imaging through the chest, abdomen and pelvis was performed using the standard protocol during bolus administration of intravenous contrast. Multiplanar reconstructed images and MIPs were obtained and reviewed to evaluate the vascular anatomy.  CONTRAST:  74mL OMNIPAQUE IOHEXOL 350 MG/ML SOLN  COMPARISON:  Chest CT 10/22/2013  FINDINGS: CTA CHEST FINDINGS  THORACIC INLET/BODY WALL:  No acute abnormality.  MEDIASTINUM:  Normal heart size. No pericardial effusion. Extensive atherosclerosis, status post coronary artery bypass. The lower venous graft and the LIMA graft are diminutive and typical to visualize distally on this nongated study. No evidence of aortic intramural hematoma, dissection, or other acute aortic syndrome. No aortic aneurysm. No visualized pulmonary embolism.  LUNG WINDOWS:  No consolidation. No effusion. Diffuse bronchial wall thickening, likely related to patient's history of smoking.   OSSEOUS:  No acute fracture.  No suspicious lytic or blastic lesions.  Review of the MIP images confirms the above findings.  CTA ABDOMEN AND PELVIS FINDINGS  Hepatobiliary: Hepatic steatosis. No focal liver abnormality.No evidence of biliary obstruction or stone.  Pancreas: Unremarkable.  Spleen: Unremarkable.  Adrenals/Urinary Tract: Negative adrenals. 11 mm right nephrolithiasis. No hydronephrosis or ureteral stone. The left ureter is distorted, medialized near the retroperitoneal surgical changes, but not dilated or thickened. Unremarkable bladder.  Reproductive:No pathologic findings.  Stomach/Bowel: Fluid dilated stomach and duodenum without obstructive process. No appendicitis.  Vascular/Lymphatic: Separate origin hepatic artery, otherwise standard aortic branching. There is diffuse atherosclerosis, predominately noncalcified, without flow limiting visceral stenosis. Status post stenting of the right common iliac artery, widely patent. There is also surgical change near the right common femoral artery. No evidence of dissection or aneurysm. No inflammatory changes.  No mass or adenopathy.  Peritoneal: No ascites or pneumoperitoneum.  Musculoskeletal: L3-4 and L4-5 anterior lumbar interbody fusion, accounting for retroperitoneal surgical clips. Bone harvest site from the left iliac crest. 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  Review of the MIP images confirms the above findings.  IMPRESSION: 1. No acute arterial finding. 2. Fluid filled stomach and duodenum without obstructive process. 3. Hepatic steatosis and right nephrolithiasis.   Electronically Signed   By: Monte Fantasia M.D.   On: 03/13/2015 01:13   Ct Angio Abd/pel W/ And/or W/o  03/13/2015   CLINICAL DATA:  Chest and abdominal pain.  EXAM: CT ANGIOGRAPHY CHEST, ABDOMEN AND PELVIS  TECHNIQUE: Multidetector CT imaging through the chest, abdomen and pelvis was performed using the standard protocol during bolus administration of  intravenous contrast. Multiplanar reconstructed images and MIPs were obtained and reviewed to evaluate the vascular anatomy.  CONTRAST:  15mL OMNIPAQUE IOHEXOL 350 MG/ML SOLN  COMPARISON:  Chest CT 10/22/2013  FINDINGS: CTA CHEST FINDINGS  THORACIC INLET/BODY WALL:  No acute abnormality.  MEDIASTINUM:  Normal heart size. No pericardial effusion. Extensive atherosclerosis, status post coronary artery bypass. The lower venous graft and the LIMA graft are diminutive and typical to visualize distally on this nongated study. No evidence of aortic intramural hematoma, dissection, or other acute aortic syndrome. No aortic aneurysm. No visualized pulmonary  embolism.  LUNG WINDOWS:  No consolidation. No effusion. Diffuse bronchial wall thickening, likely related to patient's history of smoking.  OSSEOUS:  No acute fracture.  No suspicious lytic or blastic lesions.  Review of the MIP images confirms the above findings.  CTA ABDOMEN AND PELVIS FINDINGS  Hepatobiliary: Hepatic steatosis. No focal liver abnormality.No evidence of biliary obstruction or stone.  Pancreas: Unremarkable.  Spleen: Unremarkable.  Adrenals/Urinary Tract: Negative adrenals. 11 mm right nephrolithiasis. No hydronephrosis or ureteral stone. The left ureter is distorted, medialized near the retroperitoneal surgical changes, but not dilated or thickened. Unremarkable bladder.  Reproductive:No pathologic findings.  Stomach/Bowel: Fluid dilated stomach and duodenum without obstructive process. No appendicitis.  Vascular/Lymphatic: Separate origin hepatic artery, otherwise standard aortic branching. There is diffuse atherosclerosis, predominately noncalcified, without flow limiting visceral stenosis. Status post stenting of the right common iliac artery, widely patent. There is also surgical change near the right common femoral artery. No evidence of dissection or aneurysm. No inflammatory changes.  No mass or adenopathy.  Peritoneal: No ascites or  pneumoperitoneum.  Musculoskeletal: L3-4 and L4-5 anterior lumbar interbody fusion, accounting for retroperitoneal surgical clips. Bone harvest site from the left iliac crest. 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  Review of the MIP images confirms the above findings.  IMPRESSION: 1. No acute arterial finding. 2. Fluid filled stomach and duodenum without obstructive process. 3. Hepatic steatosis and right nephrolithiasis.   Electronically Signed   By: Monte Fantasia M.D.   On: 03/13/2015 01:13      Impression / Plan:   Bryan Scott is a 69 y.o. y/o male with who has a history of coronary artery disease and reports nausea vomiting with diarrhea after eating out. The patient also had a CT scan of the abdomen that showed no acute abnormalities but did show the stomach filled with fluid as well as the duodenum filled with fluid without any obstructive process seen. He also had hepatic steatosis. The patient will be undergoing further cardiac workup tomorrow. If this is due to heartburn due to acid reflux the patient should be treated with a PPI and follow up as an outpatient with me. With his acute illnesses and his Cardiac history and chest pain the patient will better be served by an outpatient EGD and follow-up then while he is acutely ill. The patient has been explained the plan and agrees with it.  Thank you for involving me in the care of this patient.        Ollen Bowl, MD  03/14/2015, 2:53 PM   Note: This dictation was prepared with Dragon dictation along with smaller phrase technology. Any transcriptional errors that result from this process are unintentional.

## 2015-03-14 NOTE — Progress Notes (Addendum)
Pt complaining of diarrhea, would like medication.  Dr. Ether Griffins notified.  MD to place orders,  MD also adding in IV fluids to help with low BP.  Will continue to monitor. Jessee Avers

## 2015-03-14 NOTE — Progress Notes (Signed)
[facility] [place] NAME: Bryan Scott    MR#:  [id number]  DATE OF BIRTH:  Sep 12, 1945  SUBJECTIVE:  CHIEF COMPLAINT:  Patient is reporting heart burn and burning in stomach.Diarrhea is better. BP was very high this am  REVIEW OF SYSTEMS:  CONSTITUTIONAL: No fever, fatigue or weakness.  EYES: No blurred or double vision.  EARS, NOSE, AND THROAT: No tinnitus or ear pain.  RESPIRATORY: No cough, shortness of breath, wheezing or hemoptysis.  CARDIOVASCULAR: Reports heart burn and stomach burn  No chest pain, orthopnea, edema.  GASTROINTESTINAL: Reports nausea, diarrhea or abdominal pain. Denies any vomiting GENITOURINARY: No dysuria, hematuria.  ENDOCRINE: No polyuria, nocturia,  HEMATOLOGY: No anemia, easy bruising or bleeding SKIN: No rash or lesion. MUSCULOSKELETAL: No joint pain or arthritis.   NEUROLOGIC: No tingling, numbness, weakness.  PSYCHIATRY: No anxiety or depression.   DRUG ALLERGIES:   Allergies  Allergen Reactions  . Heparin Other (See Comments)    Reaction:  Bleeding   . Lisinopril Swelling    VITALS:  Blood pressure 99/59, pulse 69, temperature 97.7 F (36.5 C), temperature source Oral, resp. rate 26, height 6' (1.829 m), weight 98.93 kg (218 lb 1.6 oz), SpO2 99 %.  PHYSICAL EXAMINATION:  GENERAL:  69 y.o.-year-old patient lying in the bed with no acute distress.  EYES: Pupils equal, round, reactive to light and accommodation. No scleral icterus. Extraocular muscles intact.  HEENT: Head atraumatic, normocephalic. Oropharynx and nasopharynx clear.  NECK:  Supple, no jugular venous distention. No thyroid enlargement, no tenderness.  LUNGS: Normal breath sounds bilaterally, no wheezing, rales,rhonchi or crepitation. No use of accessory muscles of respiration.  CARDIOVASCULAR: S1, S2 normal. No murmurs, rubs, or gallops.  ABDOMEN: Soft, nontender, distended. Normal  bowel sounds present. No organomegaly or mass.   EXTREMITIES: No pedal edema, cyanosis, or clubbing.  NEUROLOGIC: Cranial nerves II through XII are intact. Muscle strength 5/5 in all extremities. Sensation intact. Gait not checked.  PSYCHIATRIC: The patient is alert and oriented x 3.  SKIN: No obvious rash, lesion, or ulcer.    LABORATORY PANEL:   CBC  Recent Labs Lab 03/14/15 0438  WBC 10.7*  HGB 14.0  HCT 41.1  PLT 155   ------------------------------------------------------------------------------------------------------------------  Chemistries   Recent Labs Lab 03/13/15 0722 03/14/15 0438  NA 134* 135  K 4.1 3.7  CL 104 104  CO2 20* 22  GLUCOSE 218* 169*  BUN 16 14  CREATININE 1.21 1.04  CALCIUM 9.0 8.7*  AST 31  --   ALT 31  --   ALKPHOS 68  --   BILITOT 0.7  --    ------------------------------------------------------------------------------------------------------------------  Cardiac Enzymes  Recent Labs Lab 03/13/15 1707  TROPONINI <0.03   ------------------------------------------------------------------------------------------------------------------  RADIOLOGY:  Dg Chest Portable 1 View  03/13/2015   CLINICAL DATA:  Vomiting, mid chest pain beginning this morning. Evaluate acute myocardial infarction. History of hypertension, coronary artery disease, hyperlipidemia, smoker, diabetes.  EXAM: PORTABLE CHEST 1 VIEW  COMPARISON:  Chest radiograph Oct 24, 2013  FINDINGS: Cardiomediastinal silhouette is normal, status post median sternotomy for CABG. Similar mild chronic interstitial changes without pleural effusion or focal consolidation. Similar biapical pleural thickening. No pneumothorax. ACDF partially characterized. Screws within LEFT humeral head partially imaged.  IMPRESSION: Stable chronic bronchitic changes.   Electronically Signed   By: Elon Alas M.D.   On: 03/13/2015 00:41   Ct Angio Chest Aorta W/cm &/or Wo/cm  03/13/2015   CLINICAL DATA:  Chest and abdominal pain.  EXAM: CT  ANGIOGRAPHY CHEST, ABDOMEN AND PELVIS  TECHNIQUE: Multidetector CT imaging through the chest, abdomen and pelvis was performed using the standard protocol during bolus administration of intravenous contrast. Multiplanar reconstructed images and MIPs were obtained and reviewed to evaluate the vascular anatomy.  CONTRAST:  58mL OMNIPAQUE IOHEXOL 350 MG/ML SOLN  COMPARISON:  Chest CT 10/22/2013  FINDINGS: CTA CHEST FINDINGS  THORACIC INLET/BODY WALL:  No acute abnormality.  MEDIASTINUM:  Normal heart size. No pericardial effusion. Extensive atherosclerosis, status post coronary artery bypass. The lower venous graft and the LIMA graft are diminutive and typical to visualize distally on this nongated study. No evidence of aortic intramural hematoma, dissection, or other acute aortic syndrome. No aortic aneurysm. No visualized pulmonary embolism.  LUNG WINDOWS:  No consolidation. No effusion. Diffuse bronchial wall thickening, likely related to patient's history of smoking.  OSSEOUS:  No acute fracture.  No suspicious lytic or blastic lesions.  Review of the MIP images confirms the above findings.  CTA ABDOMEN AND PELVIS FINDINGS  Hepatobiliary: Hepatic steatosis. No focal liver abnormality.No evidence of biliary obstruction or stone.  Pancreas: Unremarkable.  Spleen: Unremarkable.  Adrenals/Urinary Tract: Negative adrenals. 11 mm right nephrolithiasis. No hydronephrosis or ureteral stone. The left ureter is distorted, medialized near the retroperitoneal surgical changes, but not dilated or thickened. Unremarkable bladder.  Reproductive:No pathologic findings.  Stomach/Bowel: Fluid dilated stomach and duodenum without obstructive process. No appendicitis.  Vascular/Lymphatic: Separate origin hepatic artery, otherwise standard aortic branching. There is diffuse atherosclerosis, predominately noncalcified, without flow limiting visceral stenosis. Status post stenting of the right common iliac artery, widely patent. There is  also surgical change near the right common femoral artery. No evidence of dissection or aneurysm. No inflammatory changes.  No mass or adenopathy.  Peritoneal: No ascites or pneumoperitoneum.  Musculoskeletal: L3-4 and L4-5 anterior lumbar interbody fusion, accounting for retroperitoneal surgical clips. Bone harvest site from the left iliac crest. 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  Review of the MIP images confirms the above findings.  IMPRESSION: 1. No acute arterial finding. 2. Fluid filled stomach and duodenum without obstructive process. 3. Hepatic steatosis and right nephrolithiasis.   Electronically Signed   By: Monte Fantasia M.D.   On: 03/13/2015 01:13   Ct Angio Abd/pel W/ And/or W/o  03/13/2015   CLINICAL DATA:  Chest and abdominal pain.  EXAM: CT ANGIOGRAPHY CHEST, ABDOMEN AND PELVIS  TECHNIQUE: Multidetector CT imaging through the chest, abdomen and pelvis was performed using the standard protocol during bolus administration of intravenous contrast. Multiplanar reconstructed images and MIPs were obtained and reviewed to evaluate the vascular anatomy.  CONTRAST:  39mL OMNIPAQUE IOHEXOL 350 MG/ML SOLN  COMPARISON:  Chest CT 10/22/2013  FINDINGS: CTA CHEST FINDINGS  THORACIC INLET/BODY WALL:  No acute abnormality.  MEDIASTINUM:  Normal heart size. No pericardial effusion. Extensive atherosclerosis, status post coronary artery bypass. The lower venous graft and the LIMA graft are diminutive and typical to visualize distally on this nongated study. No evidence of aortic intramural hematoma, dissection, or other acute aortic syndrome. No aortic aneurysm. No visualized pulmonary embolism.  LUNG WINDOWS:  No consolidation. No effusion. Diffuse bronchial wall thickening, likely related to patient's history of smoking.  OSSEOUS:  No acute fracture.  No suspicious lytic or blastic lesions.  Review of the MIP images confirms the above findings.  CTA ABDOMEN AND PELVIS FINDINGS   Hepatobiliary: Hepatic steatosis. No focal liver abnormality.No evidence of biliary obstruction or stone.  Pancreas: Unremarkable.  Spleen: Unremarkable.  Adrenals/Urinary Tract: Negative adrenals. 11 mm right nephrolithiasis. No hydronephrosis or ureteral stone. The left ureter is distorted, medialized near the retroperitoneal surgical changes, but not dilated or thickened. Unremarkable bladder.  Reproductive:No pathologic findings.  Stomach/Bowel: Fluid dilated stomach and duodenum without obstructive process. No appendicitis.  Vascular/Lymphatic: Separate origin hepatic artery, otherwise standard aortic branching. There is diffuse atherosclerosis, predominately noncalcified, without flow limiting visceral stenosis. Status post stenting of the right common iliac artery, widely patent. There is also surgical change near the right common femoral artery. No evidence of dissection or aneurysm. No inflammatory changes.  No mass or adenopathy.  Peritoneal: No ascites or pneumoperitoneum.  Musculoskeletal: L3-4 and L4-5 anterior lumbar interbody fusion, accounting for retroperitoneal surgical clips. Bone harvest site from the left iliac crest. 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  Review of the MIP images confirms the above findings.  IMPRESSION: 1. No acute arterial finding. 2. Fluid filled stomach and duodenum without obstructive process. 3. Hepatic steatosis and right nephrolithiasis.   Electronically Signed   By: Monte Fantasia M.D.   On: 03/13/2015 01:13    EKG:   Orders placed or performed during the hospital encounter of 03/13/15  . EKG 12-Lead  . EKG 12-Lead  . EKG 12-Lead  . EKG 12-Lead  . ED EKG within 10 minutes  . ED EKG within 10 minutes  . ED EKG  . ED EKG  . EKG 12-Lead  . EKG 12-Lead    ASSESSMENT AND PLAN:    69 year old Caucasian gentleman history of coronary artery disease status post bypass surgery presenting with chest pain.  1. Chest pain, central: Very  unlikely from acute MI/could be from problem #2. Scheduled for lexiscan in am Appreciate cardiology recommendations  Initiate aspirin/statin therapy, cardiac enzymes 2 are negative so far., Monitor on telemetry  2. Hypertensive urgency: Markably elevated blood pressure  Metoprolol 25 mg by mouth twice a day is d/ced and resumed pts home med coreg  Continue Hydralazine  D/ed amlodipine as BP is soft . Add when necessary Hydralazine continue home medications  3. Watery diarrhea - will check stool for C. difficile toxin and occult blood .patient will be on enteric precautions at this time and provide Supportive care 4. GERD with heart burn : prob 2/2 PUD Ppi,gi consult is placed   5. Venous thrombosis prophylactic: Lovenox    All the records are reviewed and case discussed with Care Management/Social Workerr. Management plans discussed with the patient, family and they are in agreement.  CODE STATUS: Full code  TOTAL TIME TAKING CARE OF THIS PATIENT: 35 minutes.   POSSIBLE D/C IN 1-2 DAYS, DEPENDING ON CLINICAL CONDITION.   Nicholes Mango M.D on 03/14/2015 at 8:20 PM  Between 7am to 6pm - Pager - [phone] After 6pm go to [email or web address] - password [email or web address] [facility]  [facility]  Office  [phone]  CC: Primary care physician; No primary care provider on file.

## 2015-03-14 NOTE — Progress Notes (Signed)
Nitro paste removed per pharmacy.  Will continue to monitor BP. Bryan Scott

## 2015-03-14 NOTE — Progress Notes (Signed)
Spoke with dr. Margaretmary Eddy to make aware of patients current bp 124/59 hr 65. Has coreg 37.5mg  po ordered. Per md hold dose and give one time dose of coreg 12.5mg  po. Will continue to monitor [employer]

## 2015-03-14 NOTE — Progress Notes (Addendum)
Patient BP high this AM at 210/100 at 0610 IV hydralazine given. Later patient called out in distress stating he was in pain all over, he was nauseated,  that he could not breath, and that he felt like he was going to die. BP was still up, Dr. Marcille Blanco notified. Patient given 1 inch of nitropaste and the metoprolol and norvasc scheduled for 10 am per Dr. Marcille Blanco. BP only got higher so pt given SL nitro. Morphine given for pain and resp difficulty and zofran for nausea. O2 placed for comfort.  Rapid response called at 0650 due to no improvement in BP and patient continueing to state he was in distress. Patient's color was different and was behaving very different than he had during the night.  Finally after 0700 blood pressure started coming down, 168/73. Patient seemed less distressed.

## 2015-03-14 NOTE — Progress Notes (Addendum)
A&O. INdependant. IVF infusing. Medicated for pain throughout the night. No s/s distress. BP has been high, 183/78 when taken at beginning of shift but trending down after regularly scheduled medications.

## 2015-03-14 NOTE — Consult Note (Signed)
[facility] Cardiology  CARDIOLOGY CONSULT NOTE  Patient ID: Bryan Scott MRN: [id number] DOB/AGE: [birth year] 69 y.o.  Admit date: 03/13/2015 Referring Physician [facility] Primary Physician [facility] Primary Cardiologist  Reason for Consultation chest pain  HPI: 69 year old gentleman with known history of coronary artery disease, status post CABG, who presents with 2-3 day history of flulike symptoms, with nausea, vomiting, diarrhea, fever and chills. Patient also complained of substernal chest discomfort, with nondiagnostic ECG, has ruled out for myocardial infarction by CPK isoenzymes and troponin.  Review of systems complete and found to be negative unless listed above     Past Medical History  Diagnosis Date  . Hypertension   . GERD (gastroesophageal reflux disease)   . Chronic pain syndrome   . Hyperlipidemia   . Coronary artery disease     s/p CABG ~ 2007  . History of tobacco use   . Type 2 diabetes mellitus     A1C 6.8% in 01/2013  . Carotid artery disease     > 75% bilateral ICA stenoses on 01/2013 CT  . 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 Heparin allergy     Bleeding  . Paroxysmal atrial fibrillation     On Xarelto anticoagulation  . Cancer   . CHF (congestive heart failure)     Past Surgical History  Procedure Laterality Date  . Cardiac catheterization    . Coronary artery bypass graft  2007    Prescriptions prior to admission  Medication Sig Dispense Refill Last Dose  . acetaminophen (TYLENOL) 325 MG tablet Take 975 mg by mouth 3 (three) times daily as needed for mild pain or fever.   PRN at PRN  . aspirin 81 MG chewable tablet Chew 81 mg by mouth daily.   unknown at unknown  . atorvastatin (LIPITOR) 80 MG tablet Take 40 mg by mouth at bedtime.   unknown at unknown  . carvedilol (COREG) 12.5 MG tablet Take 37.5 mg by mouth every 12 (twelve) hours.   unknown at unknown  . clopidogrel (PLAVIX) 75 MG tablet Take 75 mg by mouth daily.   unknown at unknown  .  hydrochlorothiazide (HYDRODIURIL) 25 MG tablet Take 25 mg by mouth daily.   unknown at unknown  . magnesium oxide (MAG-OX) 400 MG tablet Take 400 mg by mouth 2 (two) times daily.    unknown at unknown  . metFORMIN (GLUCOPHAGE) 1000 MG tablet Take 1,000 mg by mouth 2 (two) times daily.   unknown at unknown  . morphine (MS CONTIN) 15 MG 12 hr tablet Take 15 mg by mouth every 8 (eight) hours.   unknown at unknown  . ondansetron (ZOFRAN) 8 MG tablet Take 8 mg by mouth every 8 (eight) hours as needed for nausea or vomiting.    PRN at PRN  . pantoprazole (PROTONIX) 40 MG tablet Take 40 mg by mouth daily.   unknown at unknown   Social History   Social History  . Marital Status: Married    Spouse Name: N/A  . Number of Children: N/A  . Years of Education: N/A   Occupational History  . Not on file.   Social History Main Topics  . Smoking status: Former Smoker -- 0.00 packs/day    Types: Cigarettes  . Smokeless tobacco: Not on file     Comment: 30 pack-year history. Quit 8-9 years ago.   . Alcohol Use: No  . Drug Use: No  . Sexual Activity: Not on file  Other Topics Concern  . Not on file   Social History Narrative    Family History  Problem Relation Age of Onset  . Seizures Father 93    Deceased from complications with seizures  . Heart disease Mother 56    Deceased      Review of systems complete and found to be negative unless listed above      PHYSICAL EXAM  General: Well developed, well nourished, in no acute distress HEENT:  Normocephalic and atramatic Neck:  No JVD.  Lungs: Clear bilaterally to auscultation and percussion. Heart: HRRR . Normal S1 and S2 without gallops or murmurs.  Abdomen: Bowel sounds are positive, abdomen soft and non-tender  Msk:  Back normal, normal gait. Normal strength and tone for age. Extremities: No clubbing, cyanosis or edema.   Neuro: Alert and oriented X 3. Psych:  Good affect, responds appropriately  Labs:   Lab Results   Component Value Date   WBC 10.7* 03/14/2015   HGB 14.0 03/14/2015   HCT 41.1 03/14/2015   MCV 93.3 03/14/2015   PLT 155 03/14/2015    Recent Labs Lab 03/13/15 0722 03/14/15 0438  NA 134* 135  K 4.1 3.7  CL 104 104  CO2 20* 22  BUN 16 14  CREATININE 1.21 1.04  CALCIUM 9.0 8.7*  PROT 7.2  --   BILITOT 0.7  --   ALKPHOS 68  --   ALT 31  --   AST 31  --   GLUCOSE 218* 169*   Lab Results  Component Value Date   TROPONINI <0.03 03/13/2015    Lab Results  Component Value Date   CHOL 141 03/14/2015   CHOL 142 10/23/2013   CHOL 136 10/16/2013   Lab Results  Component Value Date   HDL 36* 03/14/2015   HDL 37* 10/23/2013   HDL 37* 10/16/2013   Lab Results  Component Value Date   LDLCALC 63 03/14/2015   LDLCALC 61 10/23/2013   LDLCALC 78 10/16/2013   Lab Results  Component Value Date   TRIG 211* 03/14/2015   TRIG 218* 10/23/2013   TRIG 103 10/16/2013   Lab Results  Component Value Date   CHOLHDL 3.9 03/14/2015   No results found for: LDLDIRECT    Radiology: Dg Chest Portable 1 View  03/13/2015   CLINICAL DATA:  Vomiting, mid chest pain beginning this morning. Evaluate acute myocardial infarction. History of hypertension, coronary artery disease, hyperlipidemia, smoker, diabetes.  EXAM: PORTABLE CHEST 1 VIEW  COMPARISON:  Chest radiograph Oct 24, 2013  FINDINGS: Cardiomediastinal silhouette is normal, status post median sternotomy for CABG. Similar mild chronic interstitial changes without pleural effusion or focal consolidation. Similar biapical pleural thickening. No pneumothorax. ACDF partially characterized. Screws within LEFT humeral head partially imaged.  IMPRESSION: Stable chronic bronchitic changes.   Electronically Signed   By: Elon Alas M.D.   On: 03/13/2015 00:41   Ct Angio Chest Aorta W/cm &/or Wo/cm  03/13/2015   CLINICAL DATA:  Chest and abdominal pain.  EXAM: CT ANGIOGRAPHY CHEST, ABDOMEN AND PELVIS  TECHNIQUE: Multidetector CT imaging  through the chest, abdomen and pelvis was performed using the standard protocol during bolus administration of intravenous contrast. Multiplanar reconstructed images and MIPs were obtained and reviewed to evaluate the vascular anatomy.  CONTRAST:  48mL OMNIPAQUE IOHEXOL 350 MG/ML SOLN  COMPARISON:  Chest CT 10/22/2013  FINDINGS: CTA CHEST FINDINGS  THORACIC INLET/BODY WALL:  No acute abnormality.  MEDIASTINUM:  Normal heart size. No pericardial effusion.  Extensive atherosclerosis, status post coronary artery bypass. The lower venous graft and the LIMA graft are diminutive and typical to visualize distally on this nongated study. No evidence of aortic intramural hematoma, dissection, or other acute aortic syndrome. No aortic aneurysm. No visualized pulmonary embolism.  LUNG WINDOWS:  No consolidation. No effusion. Diffuse bronchial wall thickening, likely related to patient's history of smoking.  OSSEOUS:  No acute fracture.  No suspicious lytic or blastic lesions.  Review of the MIP images confirms the above findings.  CTA ABDOMEN AND PELVIS FINDINGS  Hepatobiliary: Hepatic steatosis. No focal liver abnormality.No evidence of biliary obstruction or stone.  Pancreas: Unremarkable.  Spleen: Unremarkable.  Adrenals/Urinary Tract: Negative adrenals. 11 mm right nephrolithiasis. No hydronephrosis or ureteral stone. The left ureter is distorted, medialized near the retroperitoneal surgical changes, but not dilated or thickened. Unremarkable bladder.  Reproductive:No pathologic findings.  Stomach/Bowel: Fluid dilated stomach and duodenum without obstructive process. No appendicitis.  Vascular/Lymphatic: Separate origin hepatic artery, otherwise standard aortic branching. There is diffuse atherosclerosis, predominately noncalcified, without flow limiting visceral stenosis. Status post stenting of the right common iliac artery, widely patent. There is also surgical change near the right common femoral artery. No evidence of  dissection or aneurysm. No inflammatory changes.  No mass or adenopathy.  Peritoneal: No ascites or pneumoperitoneum.  Musculoskeletal: L3-4 and L4-5 anterior lumbar interbody fusion, accounting for retroperitoneal surgical clips. Bone harvest site from the left iliac crest. 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  Review of the MIP images confirms the above findings.  IMPRESSION: 1. No acute arterial finding. 2. Fluid filled stomach and duodenum without obstructive process. 3. Hepatic steatosis and right nephrolithiasis.   Electronically Signed   By: Monte Fantasia M.D.   On: 03/13/2015 01:13   Ct Angio Abd/pel W/ And/or W/o  03/13/2015   CLINICAL DATA:  Chest and abdominal pain.  EXAM: CT ANGIOGRAPHY CHEST, ABDOMEN AND PELVIS  TECHNIQUE: Multidetector CT imaging through the chest, abdomen and pelvis was performed using the standard protocol during bolus administration of intravenous contrast. Multiplanar reconstructed images and MIPs were obtained and reviewed to evaluate the vascular anatomy.  CONTRAST:  65mL OMNIPAQUE IOHEXOL 350 MG/ML SOLN  COMPARISON:  Chest CT 10/22/2013  FINDINGS: CTA CHEST FINDINGS  THORACIC INLET/BODY WALL:  No acute abnormality.  MEDIASTINUM:  Normal heart size. No pericardial effusion. Extensive atherosclerosis, status post coronary artery bypass. The lower venous graft and the LIMA graft are diminutive and typical to visualize distally on this nongated study. No evidence of aortic intramural hematoma, dissection, or other acute aortic syndrome. No aortic aneurysm. No visualized pulmonary embolism.  LUNG WINDOWS:  No consolidation. No effusion. Diffuse bronchial wall thickening, likely related to patient's history of smoking.  OSSEOUS:  No acute fracture.  No suspicious lytic or blastic lesions.  Review of the MIP images confirms the above findings.  CTA ABDOMEN AND PELVIS FINDINGS  Hepatobiliary: Hepatic steatosis. No focal liver abnormality.No evidence of  biliary obstruction or stone.  Pancreas: Unremarkable.  Spleen: Unremarkable.  Adrenals/Urinary Tract: Negative adrenals. 11 mm right nephrolithiasis. No hydronephrosis or ureteral stone. The left ureter is distorted, medialized near the retroperitoneal surgical changes, but not dilated or thickened. Unremarkable bladder.  Reproductive:No pathologic findings.  Stomach/Bowel: Fluid dilated stomach and duodenum without obstructive process. No appendicitis.  Vascular/Lymphatic: Separate origin hepatic artery, otherwise standard aortic branching. There is diffuse atherosclerosis, predominately noncalcified, without flow limiting visceral stenosis. Status post stenting of the right common iliac artery, widely patent. There is  also surgical change near the right common femoral artery. No evidence of dissection or aneurysm. No inflammatory changes.  No mass or adenopathy.  Peritoneal: No ascites or pneumoperitoneum.  Musculoskeletal: L3-4 and L4-5 anterior lumbar interbody fusion, accounting for retroperitoneal surgical clips. Bone harvest site from the left iliac crest. 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  Review of the MIP images confirms the above findings.  IMPRESSION: 1. No acute arterial finding. 2. Fluid filled stomach and duodenum without obstructive process. 3. Hepatic steatosis and right nephrolithiasis.   Electronically Signed   By: Monte Fantasia M.D.   On: 03/13/2015 01:13    EKG: Normal sinus rhythm  ASSESSMENT AND PLAN:   1. Chest pain with typical and atypical features, nondiagnostic ECG, negative troponin 2. Statust post CABG 2007 at [facility] 3. Paroxysmal atrial fibrillation, chads Vasc of 6, on Xarelto  Recommendations  1. Continue current medications 2. Defer full dose anticoagulation 3. Review 2-D echocardiogram 4. Lexiscan sestamibi study in the a.m.   SignedIsaias Cowman MD,PhD, [facility] 03/14/2015, 1:42 PM

## 2015-03-14 NOTE — Progress Notes (Signed)
Spoke with dr. Margaretmary Eddy to make aware patient blood pressure is 95/53. Per md give 500 cc bolus stat, d/c norvasc and hold 1400 dose of hydralazine [place]

## 2015-03-15 LAB — GLUCOSE, CAPILLARY
GLUCOSE-CAPILLARY: 172 mg/dL — AB (ref 65–99)
GLUCOSE-CAPILLARY: 169 mg/dL — AB (ref 65–99)
Glucose-Capillary: 172 mg/dL — ABNORMAL HIGH (ref 65–99)
Glucose-Capillary: 148 mg/dL — ABNORMAL HIGH (ref 65–99)

## 2015-03-15 LAB — CBC
HEMATOCRIT: 39.2 % — AB (ref 40.0–52.0)
HEMOGLOBIN: 13.6 g/dL (ref 13.0–18.0)
MCH: 31.9 pg (ref 26.0–34.0)
MCHC: 34.6 g/dL (ref 32.0–36.0)
MCV: 92.3 fL (ref 80.0–100.0)
Platelets: 144 10*3/uL — ABNORMAL LOW (ref 150–440)
RBC: 4.25 MIL/uL — ABNORMAL LOW (ref 4.40–5.90)
RDW: 13.9 % (ref 11.5–14.5)
WBC: 10.6 10*3/uL (ref 3.8–10.6)

## 2015-03-15 MED ORDER — SODIUM CHLORIDE 0.9 % IJ SOLN
10.0000 mL | Freq: Two times a day (BID) | INTRAMUSCULAR | Status: DC
  Administered 2015-03-15 (×3): 10 mL

## 2015-03-15 MED ORDER — AMLODIPINE BESYLATE 5 MG PO TABS
5.0000 mg | ORAL_TABLET | Freq: Every day | ORAL | Status: DC
  Administered 2015-03-16: 5 mg via ORAL
  Filled 2015-03-15 (×2): qty 1

## 2015-03-15 MED ORDER — METRONIDAZOLE IN NACL 5-0.79 MG/ML-% IV SOLN
500.0000 mg | Freq: Three times a day (TID) | INTRAVENOUS | Status: DC
  Administered 2015-03-15 – 2015-03-16 (×3): 500 mg via INTRAVENOUS
  Filled 2015-03-15 (×7): qty 100

## 2015-03-15 MED ORDER — SODIUM CHLORIDE 0.9 % IJ SOLN
10.0000 mL | INTRAMUSCULAR | Status: DC | PRN
  Administered 2015-03-15 (×2): 10 mL
  Filled 2015-03-15 (×2): qty 40

## 2015-03-15 NOTE — Progress Notes (Signed)
[place] at [place] NAME: Bryan Scott    MR#:  [id number]  DATE OF BIRTH:  1945/11/25  SUBJECTIVE:  CHIEF COMPLAINT:  Patient is reporting abd pain with heart burn and burning in stomach.Diarrhea is better but still has watery BM. Refused stress test  REVIEW OF SYSTEMS:  CONSTITUTIONAL: No fever, fatigue or weakness.  EYES: No blurred or double vision.  EARS, NOSE, AND THROAT: No tinnitus or ear pain.  RESPIRATORY: No cough, shortness of breath, wheezing or hemoptysis.  CARDIOVASCULAR: Reports heart burn and stomach burn  No chest pain, orthopnea, edema.  GASTROINTESTINAL: Reports diarrhea and  abdominal pain. Denies any vomiting GENITOURINARY: No dysuria, hematuria.  ENDOCRINE: No polyuria, nocturia,  HEMATOLOGY: No anemia, easy bruising or bleeding SKIN: No rash or lesion. MUSCULOSKELETAL: No joint pain or arthritis.   NEUROLOGIC: No tingling, numbness, weakness.  PSYCHIATRY: No anxiety or depression.   DRUG ALLERGIES:   Allergies  Allergen Reactions  . Heparin Other (See Comments)    Reaction:  Bleeding   . Lisinopril Swelling    VITALS:  Blood pressure 141/80, pulse 63, temperature 97.5 F (36.4 C), temperature source Oral, resp. rate 22, height 6' (1.829 m), weight 98.93 kg (218 lb 1.6 oz), SpO2 97 %.  PHYSICAL EXAMINATION:  GENERAL:  70 y.o.-year-old patient lying in the bed with no acute distress.  EYES: Pupils equal, round, reactive to light and accommodation. No scleral icterus. Extraocular muscles intact.  HEENT: Head atraumatic, normocephalic. Oropharynx and nasopharynx clear.  NECK:  Supple, no jugular venous distention. No thyroid enlargement, no tenderness.  LUNGS: Normal breath sounds bilaterally, no wheezing, rales,rhonchi or crepitation. No use of accessory muscles of respiration.  CARDIOVASCULAR: S1, S2 normal. No murmurs, rubs, or gallops.  ABDOMEN: Soft, nontender, distended. Normal  bowel sounds  present. No organomegaly or mass.  EXTREMITIES: No pedal edema, cyanosis, or clubbing.  NEUROLOGIC: Cranial nerves II through XII are intact. Muscle strength 5/5 in all extremities. Sensation intact. Gait not checked.  PSYCHIATRIC: The patient is alert and oriented x 3.  SKIN: No obvious rash, lesion, or ulcer.    LABORATORY PANEL:   CBC  Recent Labs Lab 03/15/15 0508  WBC 10.6  HGB 13.6  HCT 39.2*  PLT 144*   ------------------------------------------------------------------------------------------------------------------  Chemistries   Recent Labs Lab 03/13/15 0722 03/14/15 0438  NA 134* 135  K 4.1 3.7  CL 104 104  CO2 20* 22  GLUCOSE 218* 169*  BUN 16 14  CREATININE 1.21 1.04  CALCIUM 9.0 8.7*  AST 31  --   ALT 31  --   ALKPHOS 68  --   BILITOT 0.7  --    ------------------------------------------------------------------------------------------------------------------  Cardiac Enzymes  Recent Labs Lab 03/13/15 1707  TROPONINI <0.03   ------------------------------------------------------------------------------------------------------------------  RADIOLOGY:  No results found.  EKG:   Orders placed or performed during the hospital encounter of 03/13/15  . EKG 12-Lead  . EKG 12-Lead  . EKG 12-Lead  . EKG 12-Lead  . ED EKG within 10 minutes  . ED EKG within 10 minutes  . ED EKG  . ED EKG  . EKG 12-Lead  . EKG 12-Lead  . EKG    ASSESSMENT AND PLAN:    69 year old Caucasian gentleman history of coronary artery disease status post bypass surgery presenting with chest pain.  1. Chest pain, central: could be from problem #2. Pt refused to get  lexiscan in am Appreciate cardiology recommendations  Initiated aspirin/statin therapy, cardiac enzymes  2 are negative , Monitor on telemetry  2. Hypertensive urgency: Markably elevated blood pressure  Metoprolol 25 mg by mouth twice a day is d/ced and resumed pts home med coreg 37.5 mg bid Continue  Hydralazine  Amlodipine 5 mg is added . Add when necessary Hydralazine continue home medications  3. Watery diarrhea -  Flagyl 500 mg started check stool for C. difficile toxin and occult blood   provide Supportive care  4. GERD with heart burn : prob 2/2 PUD Ppi Gi is recommending OP EGD  5. Venous thrombosis prophylactic: Lovenox    All the records are reviewed and case discussed with Care Management/Social Workerr. Management plans discussed with the patient, family and they are in agreement.  CODE STATUS: Full code  TOTAL TIME TAKING CARE OF THIS PATIENT: 35 minutes.   POSSIBLE D/C IN 1-2 DAYS, DEPENDING ON CLINICAL CONDITION.   Nicholes Mango M.D on 03/15/2015 at 10:37 PM  Between 7am to 6pm - Pager - [phone] After 6pm go to [email or web address] - password [email or web address] [facility]  [facility]  Office  [phone]  CC: Primary care physician; No primary care provider on file.

## 2015-03-15 NOTE — Progress Notes (Signed)
Inpatient Diabetes Program Recommendations  AACE/ADA: New Consensus Statement on Inpatient Glycemic Control (2015)  Target Ranges:  Prepandial:   less than 140 mg/dL      Peak postprandial:   less than 180 mg/dL (1-2 hours)      Critically ill patients:  140 - 180 mg/dL  Review of Glycemic control  Results for REECE, FEHNEL (MRN [id number]) as of 03/15/2015 10:57  Ref. Range 03/14/2015 07:36 03/14/2015 11:33 03/14/2015 16:16 03/14/2015 20:43 03/15/2015 08:08  Glucose-Capillary Latest Ref Range: 65-99 mg/dL 215 (H) 242 (H) 196 (H) 216 (H) 172 (H)    Diabetes history: Type 2 diabetes Outpatient Diabetes medications: Metformin 1000 mg bid Current orders for Inpatient glycemic control: Novolog moderate tid with meals and HS  Inpatient Diabetes Program Recommendations:     May consider adding Levemir 20 units daily while patient is in the hospital. A1C 8.0%    Patient will need to follow up with family doctor post discharge for re-evaluation of diabetes medications  Gentry Fitz, RN, [place], [place], CDE Diabetes Coordinator Inpatient Diabetes Program  [phone] (Team Pager) [phone] ([place]) 03/15/2015 11:01 AM

## 2015-03-15 NOTE — Plan of Care (Signed)
Problem: Consults Goal: Chest Pain Patient Education (See Patient Education module for education specifics.) Outcome: Completed/Met Date Met:  03/15/15 Went for stress test

## 2015-03-15 NOTE — Progress Notes (Signed)
Refused to go for stress test, MD Gouru notified.. ROom air. NSR. Pt reports pain and received oxy and morphine. Zofran for nausea. Pt reports no loose stools. IV Flagyl for loose stools. UP in room ad lib. A & O. Takes meds ok. Pt has no further concerns at this time.

## 2015-03-16 LAB — GLUCOSE, CAPILLARY
GLUCOSE-CAPILLARY: 220 mg/dL — AB (ref 65–99)
Glucose-Capillary: 224 mg/dL — ABNORMAL HIGH (ref 65–99)

## 2015-03-16 LAB — STOOL CULTURE: SPECIAL REQUESTS: NORMAL

## 2015-03-16 MED ORDER — ALUM & MAG HYDROXIDE-SIMETH 200-200-20 MG/5ML PO SUSP: 30.0000 mL | Freq: Four times a day (QID) | ORAL | Status: DC | PRN

## 2015-03-16 MED ORDER — SACCHAROMYCES BOULARDII 250 MG PO CAPS: 250.0000 mg | ORAL_CAPSULE | Freq: Two times a day (BID) | ORAL | Status: DC

## 2015-03-16 MED ORDER — AMLODIPINE BESYLATE 5 MG PO TABS: 5.0000 mg | ORAL_TABLET | Freq: Every day | ORAL | Status: DC

## 2015-03-16 MED ORDER — HYDRALAZINE HCL 25 MG PO TABS: 25.0000 mg | ORAL_TABLET | Freq: Three times a day (TID) | ORAL | Status: DC

## 2015-03-16 MED ORDER — METRONIDAZOLE 500 MG PO TABS: 500.0000 mg | ORAL_TABLET | Freq: Three times a day (TID) | ORAL | Status: AC

## 2015-03-16 MED ORDER — PANTOPRAZOLE SODIUM 40 MG PO TBEC
40.0000 mg | DELAYED_RELEASE_TABLET | Freq: Every day | ORAL | Status: DC
Start: 2015-03-16 — End: 2016-10-14

## 2015-03-16 MED ORDER — CARVEDILOL 12.5 MG PO TABS: 37.5000 mg | ORAL_TABLET | Freq: Two times a day (BID) | ORAL | Status: DC

## 2015-03-16 MED ORDER — OXYCODONE HCL 5 MG PO TABS: 5.0000 mg | ORAL_TABLET | ORAL | Status: DC | PRN

## 2015-03-16 NOTE — Discharge Instructions (Signed)
Activity as tolerated  Diet -ada, aha, bland diet  F/male with a primary care physician, cardiology and gastroenterology as recommended

## 2015-03-16 NOTE — Discharge Summary (Signed)
[place] at [place] NAME: Bryan Scott    MR#:  [id number]  DATE OF BIRTH:  29-Jun-1945  DATE OF ADMISSION:  03/13/2015 ADMITTING PHYSICIAN: Lytle Butte, MD  DATE OF DISCHARGE: 03/16/2015  PRIMARY CARE PHYSICIAN: No primary care provider on file.    ADMISSION DIAGNOSIS:  Hypertension [I10] Abdominal pain [R10.9] Chest pain [R07.9]  DISCHARGE DIAGNOSIS:  Chest pain - atypical - prob 2/2 GI HTN urgency Non c diff diarrhea  SECONDARY DIAGNOSIS:   Past Medical History  Diagnosis Date  . Hypertension   . GERD (gastroesophageal reflux disease)   . Chronic pain syndrome   . Hyperlipidemia   . Coronary artery disease     s/p CABG ~ 2007  . History of tobacco use   . Type 2 diabetes mellitus     A1C 6.8% in 01/2013  . Carotid artery disease     > 75% bilateral ICA stenoses on 01/2013 CT  . 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 Heparin allergy     Bleeding  . Paroxysmal atrial fibrillation     On Xarelto anticoagulation  . Cancer   . CHF (congestive heart failure)     HOSPITAL COURSE:   69 year old Caucasian gentleman history of coronary artery disease status post bypass surgery presenting with chest pain.  1. Chest pain, central: could be from problem #2./GERD Pt refused to get [place] yesterday Appreciate cardiology recommendations Initiated aspirin/statin therapy, cardiac enzymes 2 are negative , Monitor on telemetry  2. Hypertensive urgency: Markably elevated blood pressure  Metoprolol 25 mg by mouth twice a day is d/ced and resumed pts home med coreg 37.5 mg bid Continue Hydralazine  Amlodipine 5 mg is added . Add when necessary Hydralazine continue home medications  3. Watery diarrhea -  Improved with Flagyl 500 mg  provide Supportive care  4. GERD with heart burn : prob 2/2 PUD Ppi Gi is recommending OP EGD  5. Venous thrombosis prophylactic: Lovenox  DISCHARGE CONDITIONS:   fair  CONSULTS OBTAINED:  Treatment Team:  Isaias Cowman, MD Lucilla Lame, MD   PROCEDURES none   DRUG ALLERGIES:   Allergies  Allergen Reactions  . Heparin Other (See Comments)    Reaction:  Bleeding   . Lisinopril Swelling    DISCHARGE MEDICATIONS:   Current Discharge Medication List    START taking these medications   Details  alum & mag hydroxide-simeth (MAALOX/MYLANTA) 200-200-20 MG/5ML suspension Take 30 mLs by mouth every 6 (six) hours as needed for indigestion or heartburn. Qty: 355 mL, Refills: 0    amLODipine (NORVASC) 5 MG tablet Take 1 tablet (5 mg total) by mouth daily. Qty: 30 tablet, Refills: 0    metroNIDAZOLE (FLAGYL) 500 MG tablet Take 1 tablet (500 mg total) by mouth 3 (three) times daily. Qty: 15 tablet, Refills: 0    oxyCODONE (OXY IR/ROXICODONE) 5 MG immediate release tablet Take 1 tablet (5 mg total) by mouth every 4 (four) hours as needed for moderate pain. Qty: 30 tablet, Refills: 0    saccharomyces boulardii (FLORASTOR) 250 MG capsule Take 1 capsule (250 mg total) by mouth 2 (two) times daily. Qty: 30 capsule, Refills: 0      CONTINUE these medications which have CHANGED   Details  carvedilol (COREG) 12.5 MG tablet Take 3 tablets (37.5 mg total) by mouth every 12 (twelve) hours. Qty: 60 tablet, Refills: 0    hydrALAZINE (APRESOLINE) 25 MG tablet Take 1  tablet (25 mg total) by mouth 3 (three) times daily. Qty: 90 tablet, Refills: 0    pantoprazole (PROTONIX) 40 MG tablet Take 1 tablet (40 mg total) by mouth daily. Qty: 30 tablet, Refills: 0      CONTINUE these medications which have NOT CHANGED   Details  acetaminophen (TYLENOL) 325 MG tablet Take 975 mg by mouth 3 (three) times daily as needed for mild pain or fever.    aspirin 81 MG chewable tablet Chew 81 mg by mouth daily.    clopidogrel (PLAVIX) 75 MG tablet Take 75 mg by mouth daily.    hydrochlorothiazide (HYDRODIURIL) 25 MG tablet Take 25 mg by mouth daily.     magnesium oxide (MAG-OX) 400 MG tablet Take 400 mg by mouth 2 (two) times daily.     metFORMIN (GLUCOPHAGE) 1000 MG tablet Take 1,000 mg by mouth 2 (two) times daily.    morphine (MS CONTIN) 15 MG 12 hr tablet Take 15 mg by mouth every 8 (eight) hours.    ondansetron (ZOFRAN) 8 MG tablet Take 8 mg by mouth every 8 (eight) hours as needed for nausea or vomiting.       STOP taking these medications     atorvastatin (LIPITOR) 80 MG tablet      atorvastatin (LIPITOR) 40 MG tablet      omeprazole (PRILOSEC) 20 MG capsule      senna-docusate (SENOKOT-S) 8.6-50 MG per tablet          DISCHARGE INSTRUCTIONS:   Activity as tolerated  Activity as tolerated  Diet -ada, aha, bland diet  F/ u with a primary care physician, cardiology and gastroenterology as recommended    DIET:   Activity as tolerated  DISCHARGE CONDITION:  fair  ACTIVITY:  As tolerated  OXYGEN:  Home Oxygen: no   Oxygen Delivery:none   DISCHARGE LOCATION:  home  If you experience worsening of your admission symptoms, develop shortness of breath, life threatening emergency, suicidal or homicidal thoughts you must seek medical attention immediately by calling 911 or calling your MD immediately  if symptoms less severe.  You Must read complete instructions/literature along with all the possible adverse reactions/side effects for all the Medicines you take and that have been prescribed to you. Take any new Medicines after you have completely understood and accpet all the possible adverse reactions/side effects.   Please note  You were cared for by a hospitalist during your hospital stay. If you have any questions about your discharge medications or the care you received while you were in the hospital after you are discharged, you can call the unit and asked to speak with the hospitalist on call if the hospitalist that took care of you is not available. Once you are discharged, your primary care physician will  handle any further medical issues. Please note that NO REFILLS for any discharge medications will be authorized once you are discharged, as it is imperative that you return to your primary care physician (or establish a relationship with a primary care physician if you do not have one) for your aftercare needs so that they can reassess your need for medications and monitor your lab values.     Today  Chief Complaint  Patient presents with  . Chest Pain   Pt is feeling better today , heart burn is better. No ch pain or sob. Diarrhea and abd pain improved  ROS: CONSTITUTIONAL: Denies fevers, chills. Denies any fatigue, weakness.  EYES: Denies blurry vision, double vision, eye  pain. EARS, NOSE, THROAT: Denies tinnitus, ear pain, hearing loss. RESPIRATORY: Denies cough, wheeze, shortness of breath.  CARDIOVASCULAR: Denies chest pain, palpitations, edema.  GASTROINTESTINAL: Denies nausea, vomiting, diarrhea, abdominal pain. Denies bright red blood per rectum. GENITOURINARY: Denies dysuria, hematuria. ENDOCRINE: Denies nocturia or thyroid problems. HEMATOLOGIC AND LYMPHATIC: Denies easy bruising or bleeding. SKIN: Denies rash or lesion. MUSCULOSKELETAL: Denies pain in neck, back, shoulder, knees, hips or arthritic symptoms.  NEUROLOGIC: Denies paralysis, paresthesias.  PSYCHIATRIC: Denies anxiety or depressive symptoms.   VITAL SIGNS:  Blood pressure 139/49, pulse 65, temperature 97.3 F (36.3 C), temperature source Oral, resp. rate 18, height 6' (1.829 m), weight 98.93 kg (218 lb 1.6 oz), SpO2 99 %.  I/O:   Intake/Output Summary (Last 24 hours) at 03/16/15 1312 Last data filed at 03/16/15 1205  Gross per 24 hour  Intake    480 ml  Output    575 ml  Net    -95 ml    PHYSICAL EXAMINATION:  GENERAL:  69 y.o.-year-old patient lying in the bed with no acute distress.  EYES: Pupils equal, round, reactive to light and accommodation. No scleral icterus. Extraocular muscles intact.   HEENT: Head atraumatic, normocephalic. Oropharynx and nasopharynx clear.  NECK:  Supple, no jugular venous distention. No thyroid enlargement, no tenderness.  LUNGS: Normal breath sounds bilaterally, no wheezing, rales,rhonchi or crepitation. No use of accessory muscles of respiration.  CARDIOVASCULAR: S1, S2 normal. No murmurs, rubs, or gallops.  ABDOMEN: Soft, non-tender, non-distended. Bowel sounds present. No organomegaly or mass.  EXTREMITIES: No pedal edema, cyanosis, or clubbing.  NEUROLOGIC: Cranial nerves II through XII are intact. Muscle strength 5/5 in all extremities. Sensation intact. Gait not checked.  PSYCHIATRIC: The patient is alert and oriented x 3.  SKIN: No obvious rash, lesion, or ulcer.   DATA REVIEW:   CBC  Recent Labs Lab 03/15/15 0508  WBC 10.6  HGB 13.6  HCT 39.2*  PLT 144*    Chemistries   Recent Labs Lab 03/13/15 0722 03/14/15 0438  NA 134* 135  K 4.1 3.7  CL 104 104  CO2 20* 22  GLUCOSE 218* 169*  BUN 16 14  CREATININE 1.21 1.04  CALCIUM 9.0 8.7*  AST 31  --   ALT 31  --   ALKPHOS 68  --   BILITOT 0.7  --     Cardiac Enzymes  Recent Labs Lab 03/13/15 1707  TROPONINI <0.03    Microbiology Results  Results for orders placed or performed during the hospital encounter of 03/13/15  Stool culture     Status: None   Collection Time: 03/13/15  5:57 PM  Result Value Ref Range Status   Specimen Description STOOL  Final   Special Requests Normal  Final   Culture   Final    NO SALMONELLA OR SHIGELLA ISOLATED No Pathogenic E. coli detected NO CAMPYLOBACTER DETECTED    Report Status 03/16/2015 FINAL  Final  C difficile quick scan w PCR reflex     Status: None   Collection Time: 03/13/15  5:57 PM  Result Value Ref Range Status   C Diff antigen NEGATIVE NEGATIVE Final   C Diff toxin NEGATIVE NEGATIVE Final   C Diff interpretation Negative for C. difficile  Final    RADIOLOGY:  Dg Chest Portable 1 View  03/13/2015   CLINICAL  DATA:  Vomiting, mid chest pain beginning this morning. Evaluate acute myocardial infarction. History of hypertension, coronary artery disease, hyperlipidemia, smoker, diabetes.  EXAM: PORTABLE CHEST 1  VIEW  COMPARISON:  Chest radiograph Oct 24, 2013  FINDINGS: Cardiomediastinal silhouette is normal, status post median sternotomy for CABG. Similar mild chronic interstitial changes without pleural effusion or focal consolidation. Similar biapical pleural thickening. No pneumothorax. ACDF partially characterized. Screws within LEFT humeral head partially imaged.  IMPRESSION: Stable chronic bronchitic changes.   Electronically Signed   By: Elon Alas M.D.   On: 03/13/2015 00:41   Ct Angio Chest Aorta W/cm &/or Wo/cm  03/13/2015   CLINICAL DATA:  Chest and abdominal pain.  EXAM: CT ANGIOGRAPHY CHEST, ABDOMEN AND PELVIS  TECHNIQUE: Multidetector CT imaging through the chest, abdomen and pelvis was performed using the standard protocol during bolus administration of intravenous contrast. Multiplanar reconstructed images and MIPs were obtained and reviewed to evaluate the vascular anatomy.  CONTRAST:  29mL OMNIPAQUE IOHEXOL 350 MG/ML SOLN  COMPARISON:  Chest CT 10/22/2013  FINDINGS: CTA CHEST FINDINGS  THORACIC INLET/BODY WALL:  No acute abnormality.  MEDIASTINUM:  Normal heart size. No pericardial effusion. Extensive atherosclerosis, status post coronary artery bypass. The lower venous graft and the LIMA graft are diminutive and typical to visualize distally on this nongated study. No evidence of aortic intramural hematoma, dissection, or other acute aortic syndrome. No aortic aneurysm. No visualized pulmonary embolism.  LUNG WINDOWS:  No consolidation. No effusion. Diffuse bronchial wall thickening, likely related to patient's history of smoking.  OSSEOUS:  No acute fracture.  No suspicious lytic or blastic lesions.  Review of the MIP images confirms the above findings.  CTA ABDOMEN AND PELVIS FINDINGS   Hepatobiliary: Hepatic steatosis. No focal liver abnormality.No evidence of biliary obstruction or stone.  Pancreas: Unremarkable.  Spleen: Unremarkable.  Adrenals/Urinary Tract: Negative adrenals. 11 mm right nephrolithiasis. No hydronephrosis or ureteral stone. The left ureter is distorted, medialized near the retroperitoneal surgical changes, but not dilated or thickened. Unremarkable bladder.  Reproductive:No pathologic findings.  Stomach/Bowel: Fluid dilated stomach and duodenum without obstructive process. No appendicitis.  Vascular/Lymphatic: Separate origin hepatic artery, otherwise standard aortic branching. There is diffuse atherosclerosis, predominately noncalcified, without flow limiting visceral stenosis. Status post stenting of the right common iliac artery, widely patent. There is also surgical change near the right common femoral artery. No evidence of dissection or aneurysm. No inflammatory changes.  No mass or adenopathy.  Peritoneal: No ascites or pneumoperitoneum.  Musculoskeletal: L3-4 and L4-5 anterior lumbar interbody fusion, accounting for retroperitoneal surgical clips. Bone harvest site from the left iliac crest. 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  Review of the MIP images confirms the above findings.  IMPRESSION: 1. No acute arterial finding. 2. Fluid filled stomach and duodenum without obstructive process. 3. Hepatic steatosis and right nephrolithiasis.   Electronically Signed   By: Monte Fantasia M.D.   On: 03/13/2015 01:13   Ct Angio Abd/pel W/ And/or W/o  03/13/2015   CLINICAL DATA:  Chest and abdominal pain.  EXAM: CT ANGIOGRAPHY CHEST, ABDOMEN AND PELVIS  TECHNIQUE: Multidetector CT imaging through the chest, abdomen and pelvis was performed using the standard protocol during bolus administration of intravenous contrast. Multiplanar reconstructed images and MIPs were obtained and reviewed to evaluate the vascular anatomy.  CONTRAST:  42mL OMNIPAQUE  IOHEXOL 350 MG/ML SOLN  COMPARISON:  Chest CT 10/22/2013  FINDINGS: CTA CHEST FINDINGS  THORACIC INLET/BODY WALL:  No acute abnormality.  MEDIASTINUM:  Normal heart size. No pericardial effusion. Extensive atherosclerosis, status post coronary artery bypass. The lower venous graft and the LIMA graft are diminutive and typical to visualize distally on  this nongated study. No evidence of aortic intramural hematoma, dissection, or other acute aortic syndrome. No aortic aneurysm. No visualized pulmonary embolism.  LUNG WINDOWS:  No consolidation. No effusion. Diffuse bronchial wall thickening, likely related to patient's history of smoking.  OSSEOUS:  No acute fracture.  No suspicious lytic or blastic lesions.  Review of the MIP images confirms the above findings.  CTA ABDOMEN AND PELVIS FINDINGS  Hepatobiliary: Hepatic steatosis. No focal liver abnormality.No evidence of biliary obstruction or stone.  Pancreas: Unremarkable.  Spleen: Unremarkable.  Adrenals/Urinary Tract: Negative adrenals. 11 mm right nephrolithiasis. No hydronephrosis or ureteral stone. The left ureter is distorted, medialized near the retroperitoneal surgical changes, but not dilated or thickened. Unremarkable bladder.  Reproductive:No pathologic findings.  Stomach/Bowel: Fluid dilated stomach and duodenum without obstructive process. No appendicitis.  Vascular/Lymphatic: Separate origin hepatic artery, otherwise standard aortic branching. There is diffuse atherosclerosis, predominately noncalcified, without flow limiting visceral stenosis. Status post stenting of the right common iliac artery, widely patent. There is also surgical change near the right common femoral artery. No evidence of dissection or aneurysm. No inflammatory changes.  No mass or adenopathy.  Peritoneal: No ascites or pneumoperitoneum.  Musculoskeletal: L3-4 and L4-5 anterior lumbar interbody fusion, accounting for retroperitoneal surgical clips. Bone harvest site from the left  iliac crest. 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  Review of the MIP images confirms the above findings.  IMPRESSION: 1. No acute arterial finding. 2. Fluid filled stomach and duodenum without obstructive process. 3. Hepatic steatosis and right nephrolithiasis.   Electronically Signed   By: Monte Fantasia M.D.   On: 03/13/2015 01:13    EKG:   Orders placed or performed during the hospital encounter of 03/13/15  . EKG 12-Lead  . EKG 12-Lead  . EKG 12-Lead  . EKG 12-Lead  . ED EKG within 10 minutes  . ED EKG within 10 minutes  . ED EKG  . ED EKG  . EKG 12-Lead  . EKG 12-Lead  . EKG      Management plans discussed with the patient, family and they are in agreement.  CODE STATUS:     Code Status Orders        Start     Ordered   03/13/15 0251  Full code   Continuous     03/13/15 0251      TOTAL TIME TAKING CARE OF THIS PATIENT: 45  minutes.    @[facility] @  on 03/16/2015 at 1:12 PM  Between 7am to 6pm - Pager - [phone]  After 6pm go to [email or web address] - password [email or web address] [facility]  [facility]  Office  [phone]  CC: Primary care physician; No primary care provider on file.

## 2015-03-16 NOTE — Progress Notes (Signed)
Discharge instructions explained to pt/ verbalized an understanding/ picc and iv removed/ transported off unit via wheelchair

## 2015-07-30 ENCOUNTER — Emergency Department: Payer: Medicare Other

## 2015-07-30 ENCOUNTER — Observation Stay
Admission: EM | Admit: 2015-07-30 | Discharge: 2015-08-01 | Disposition: A | Discharge status: Home / Self Care | Payer: Medicare Other | Attending: Internal Medicine | Admitting: Internal Medicine

## 2015-07-30 DIAGNOSIS — I480 Paroxysmal atrial fibrillation: Secondary | ICD-10-CM | POA: Diagnosis not present

## 2015-07-30 DIAGNOSIS — Z886 Allergy status to analgesic agent status: Secondary | ICD-10-CM | POA: Diagnosis not present

## 2015-07-30 DIAGNOSIS — I255 Ischemic cardiomyopathy: Secondary | ICD-10-CM | POA: Insufficient documentation

## 2015-07-30 DIAGNOSIS — R079 Chest pain, unspecified: Secondary | ICD-10-CM

## 2015-07-30 DIAGNOSIS — E785 Hyperlipidemia, unspecified: Secondary | ICD-10-CM | POA: Diagnosis not present

## 2015-07-30 DIAGNOSIS — I10 Essential (primary) hypertension: Secondary | ICD-10-CM | POA: Insufficient documentation

## 2015-07-30 DIAGNOSIS — R0789 Other chest pain: Secondary | ICD-10-CM | POA: Diagnosis not present

## 2015-07-30 DIAGNOSIS — I25110 Atherosclerotic heart disease of native coronary artery with unstable angina pectoris: Secondary | ICD-10-CM | POA: Insufficient documentation

## 2015-07-30 DIAGNOSIS — E1165 Type 2 diabetes mellitus with hyperglycemia: Secondary | ICD-10-CM | POA: Insufficient documentation

## 2015-07-30 DIAGNOSIS — Z7982 Long term (current) use of aspirin: Secondary | ICD-10-CM | POA: Diagnosis not present

## 2015-07-30 DIAGNOSIS — Z8249 Family history of ischemic heart disease and other diseases of the circulatory system: Secondary | ICD-10-CM | POA: Insufficient documentation

## 2015-07-30 DIAGNOSIS — I252 Old myocardial infarction: Secondary | ICD-10-CM | POA: Diagnosis not present

## 2015-07-30 DIAGNOSIS — Z794 Long term (current) use of insulin: Secondary | ICD-10-CM | POA: Diagnosis not present

## 2015-07-30 DIAGNOSIS — E119 Type 2 diabetes mellitus without complications: Secondary | ICD-10-CM | POA: Insufficient documentation

## 2015-07-30 DIAGNOSIS — K529 Noninfective gastroenteritis and colitis, unspecified: Secondary | ICD-10-CM | POA: Diagnosis not present

## 2015-07-30 DIAGNOSIS — R109 Unspecified abdominal pain: Secondary | ICD-10-CM | POA: Diagnosis not present

## 2015-07-30 DIAGNOSIS — I071 Rheumatic tricuspid insufficiency: Secondary | ICD-10-CM | POA: Insufficient documentation

## 2015-07-30 DIAGNOSIS — K219 Gastro-esophageal reflux disease without esophagitis: Secondary | ICD-10-CM | POA: Insufficient documentation

## 2015-07-30 DIAGNOSIS — E1151 Type 2 diabetes mellitus with diabetic peripheral angiopathy without gangrene: Secondary | ICD-10-CM | POA: Insufficient documentation

## 2015-07-30 DIAGNOSIS — I209 Angina pectoris, unspecified: Secondary | ICD-10-CM | POA: Insufficient documentation

## 2015-07-30 DIAGNOSIS — M79602 Pain in left arm: Secondary | ICD-10-CM | POA: Insufficient documentation

## 2015-07-30 DIAGNOSIS — Z951 Presence of aortocoronary bypass graft: Secondary | ICD-10-CM | POA: Insufficient documentation

## 2015-07-30 DIAGNOSIS — E118 Type 2 diabetes mellitus with unspecified complications: Secondary | ICD-10-CM

## 2015-07-30 DIAGNOSIS — I509 Heart failure, unspecified: Secondary | ICD-10-CM | POA: Diagnosis not present

## 2015-07-30 DIAGNOSIS — Z8551 Personal history of malignant neoplasm of bladder: Secondary | ICD-10-CM | POA: Insufficient documentation

## 2015-07-30 DIAGNOSIS — Z820 Family history of epilepsy and other diseases of the nervous system: Secondary | ICD-10-CM | POA: Insufficient documentation

## 2015-07-30 DIAGNOSIS — M542 Cervicalgia: Secondary | ICD-10-CM | POA: Insufficient documentation

## 2015-07-30 DIAGNOSIS — Z87891 Personal history of nicotine dependence: Secondary | ICD-10-CM | POA: Diagnosis not present

## 2015-07-30 DIAGNOSIS — I2510 Atherosclerotic heart disease of native coronary artery without angina pectoris: Secondary | ICD-10-CM | POA: Insufficient documentation

## 2015-07-30 DIAGNOSIS — G894 Chronic pain syndrome: Secondary | ICD-10-CM | POA: Insufficient documentation

## 2015-07-30 DIAGNOSIS — Z7901 Long term (current) use of anticoagulants: Secondary | ICD-10-CM | POA: Diagnosis not present

## 2015-07-30 HISTORY — DX: Ischemic cardiomyopathy: I25.5

## 2015-07-30 LAB — CBC
HCT: 40.8 % (ref 40.0–52.0)
Hemoglobin: 14.3 g/dL (ref 13.0–18.0)
MCH: 32.8 pg (ref 26.0–34.0)
MCHC: 35 g/dL (ref 32.0–36.0)
MCV: 93.8 fL (ref 80.0–100.0)
PLATELETS: 170 10*3/uL (ref 150–440)
RBC: 4.35 MIL/uL — AB (ref 4.40–5.90)
RDW: 13.4 % (ref 11.5–14.5)
WBC: 8 10*3/uL (ref 3.8–10.6)

## 2015-07-30 IMAGING — CR DG CHEST 1V PORT
1 series · 1 of 1 positions shown · non-contrast
Comparison: Chest CT dated [DATE]

CLINICAL DATA: 70-year-old male with chest pain

EXAM:
PORTABLE CHEST 1 VIEW

[ap]
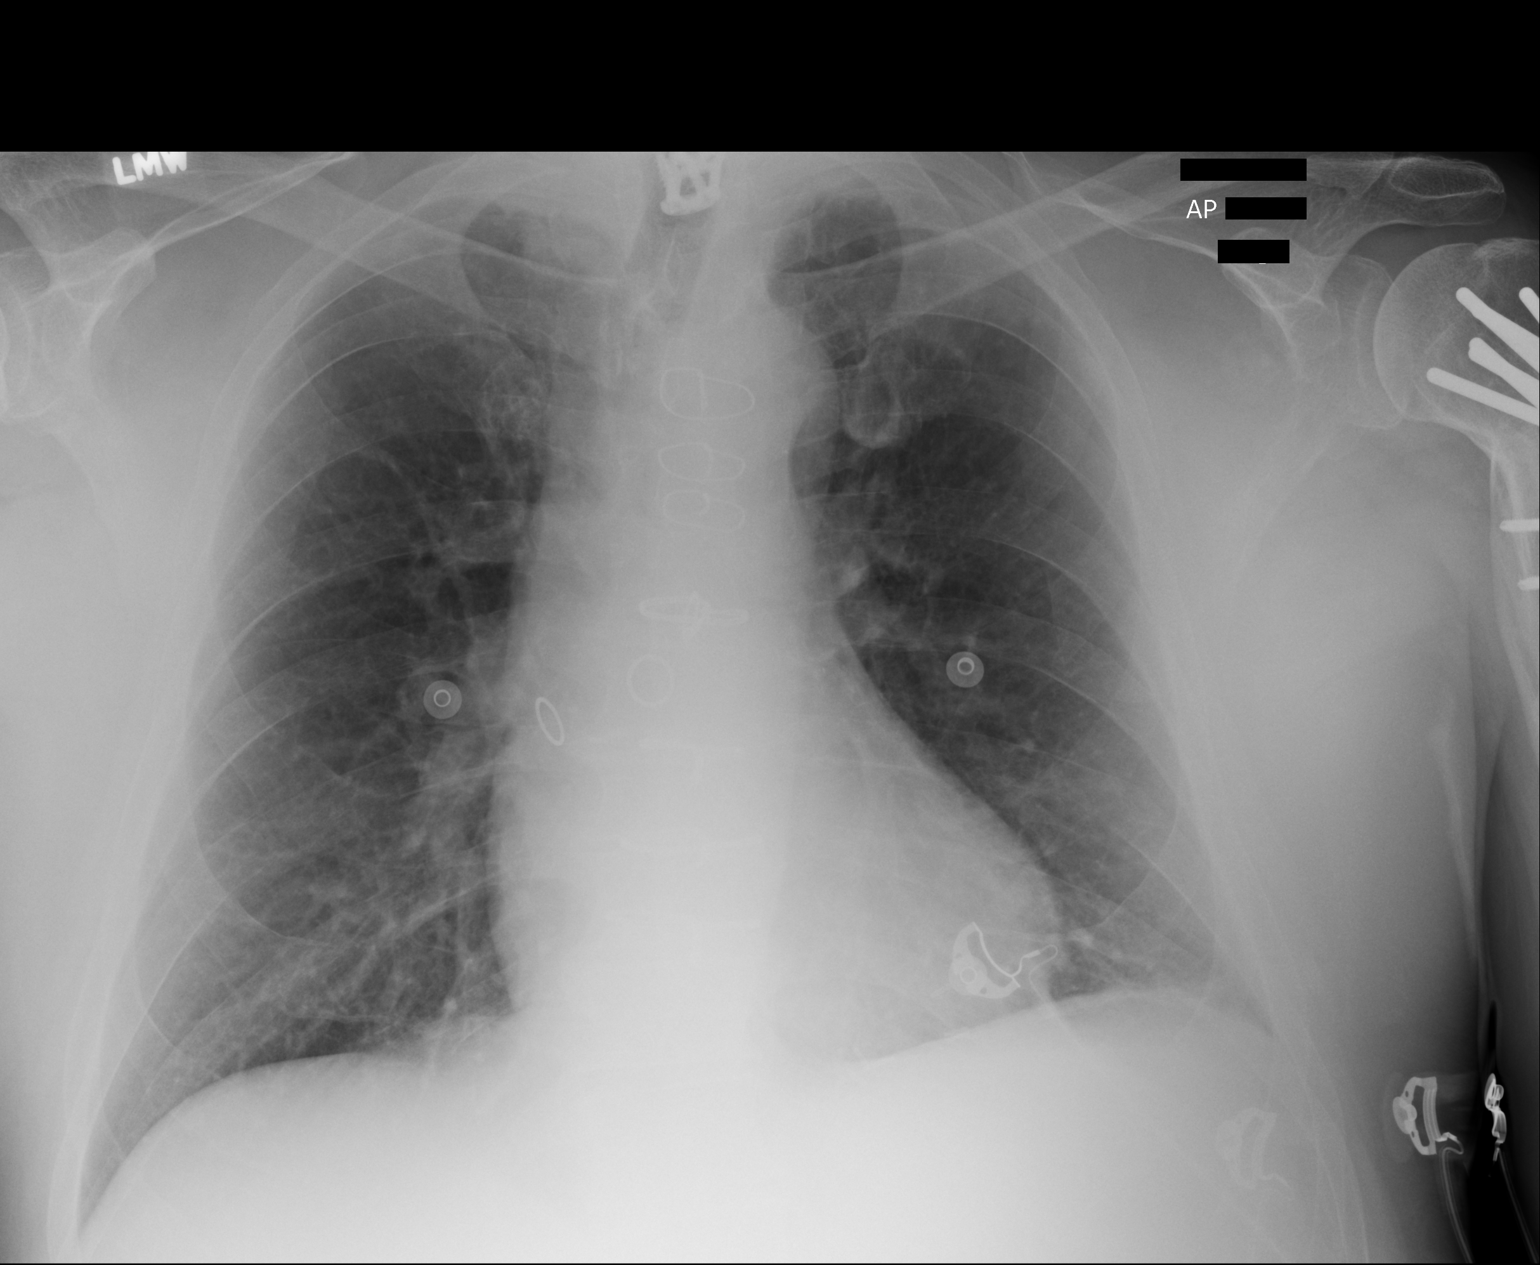

[1 of 1 positions shown; findings below may reference images not displayed]

FINDINGS: Single-view of the chest demonstrates emphysematous changes of the
lungs. There is no focal consolidation, pleural effusion, or
pneumothorax. The cardiac silhouette is within normal limits. Median
sternotomy wires and CABG vascular clips noted. Lower cervical spine
fixation plate and screws. Partially visualized left proximal
humeral orthopedic hardware. No acute fracture.
IMPRESSION: No active disease.

## 2015-07-30 MED ORDER — MORPHINE SULFATE (PF) 4 MG/ML IV SOLN
INTRAVENOUS | Status: AC
  Administered 2015-07-31: 4 mg via INTRAVENOUS
  Filled 2015-07-30: qty 1

## 2015-07-30 MED ORDER — ONDANSETRON HCL 4 MG/2ML IJ SOLN
4.0000 mg | Freq: Once | INTRAMUSCULAR | Status: AC
  Administered 2015-07-31: 4 mg via INTRAVENOUS

## 2015-07-30 MED ORDER — ONDANSETRON HCL 4 MG/2ML IJ SOLN
INTRAMUSCULAR | Status: AC
  Administered 2015-07-31: 4 mg via INTRAVENOUS
  Filled 2015-07-30: qty 2

## 2015-07-30 MED ORDER — MORPHINE SULFATE (PF) 4 MG/ML IV SOLN
4.0000 mg | Freq: Once | INTRAVENOUS | Status: AC
  Administered 2015-07-31: 4 mg via INTRAVENOUS

## 2015-07-30 NOTE — ED Provider Notes (Signed)
[facility] Emergency Department Provider Note  ____________________________________________  Time seen: 11:40 PM  I have reviewed the triage vital signs and the nursing notes.   HISTORY  Chief Complaint Chest Pain     HPI Bryan Scott is a 70 y.o. male Bryan Scott with current 10 out of 10 central chest pain with radiation to the left neck and left arm with onset at 7:30 PM tonight. Of note patient has a history of coronary artery bypass graft 11 years ago in addition to the stent placement (patient unsure when the stent was placed). Patient also admits to dyspnea worse with exertion. Of note patient is allergic to aspirin.     Past Medical History  Diagnosis Date  . Hypertension   . GERD (gastroesophageal reflux disease)   . Chronic pain syndrome   . Hyperlipidemia   . Coronary artery disease     s/p CABG ~ 2007  . History of tobacco use   . Type 2 diabetes mellitus     A1C 6.8% in 01/2013  . Carotid artery disease     > 75% bilateral ICA stenoses on 01/2013 CT  . 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 Heparin allergy     Bleeding  . Paroxysmal atrial fibrillation     On Xarelto anticoagulation  . Cancer   . CHF (congestive heart failure)     Patient Active Problem List   Diagnosis Date Noted  . Abdominal pain   . Chest pain, central 03/13/2015  . Hypertensive urgency 03/13/2015    Past Surgical History  Procedure Laterality Date  . Cardiac catheterization    . Coronary artery bypass graft  2007    Current Outpatient Rx  Name  Route  Sig  Dispense  Refill  . acetaminophen (TYLENOL) 325 MG tablet   Oral   Take 975 mg by mouth 3 (three) times daily as needed for mild pain or fever.         Marland Kitchen alum & mag hydroxide-simeth (MAALOX/MYLANTA) 200-200-20 MG/5ML suspension   Oral   Take 30 mLs by mouth every 6 (six) hours as needed for indigestion or heartburn.   355 mL   0   . amLODipine (NORVASC) 5 MG tablet   Oral   Take 1 tablet  (5 mg total) by mouth daily.   30 tablet   0   . aspirin 81 MG chewable tablet   Oral   Chew 81 mg by mouth daily.         . carvedilol (COREG) 12.5 MG tablet   Oral   Take 3 tablets (37.5 mg total) by mouth every 12 (twelve) hours.   60 tablet   0   . clopidogrel (PLAVIX) 75 MG tablet   Oral   Take 75 mg by mouth daily.         . hydrALAZINE (APRESOLINE) 25 MG tablet   Oral   Take 1 tablet (25 mg total) by mouth 3 (three) times daily.   90 tablet   0   . hydrochlorothiazide (HYDRODIURIL) 25 MG tablet   Oral   Take 25 mg by mouth daily.         . magnesium oxide (MAG-OX) 400 MG tablet   Oral   Take 400 mg by mouth 2 (two) times daily.          . metFORMIN (GLUCOPHAGE) 1000 MG tablet   Oral   Take 1,000 mg by mouth 2 (two) times  daily.         . morphine (MS CONTIN) 15 MG 12 hr tablet   Oral   Take 15 mg by mouth every 8 (eight) hours.         . ondansetron (ZOFRAN) 8 MG tablet   Oral   Take 8 mg by mouth every 8 (eight) hours as needed for nausea or vomiting.          Marland Kitchen oxyCODONE (OXY IR/ROXICODONE) 5 MG immediate release tablet   Oral   Take 1 tablet (5 mg total) by mouth every 4 (four) hours as needed for moderate pain.   30 tablet   0   . pantoprazole (PROTONIX) 40 MG tablet   Oral   Take 1 tablet (40 mg total) by mouth daily.   30 tablet   0   . saccharomyces boulardii (FLORASTOR) 250 MG capsule   Oral   Take 1 capsule (250 mg total) by mouth 2 (two) times daily.   30 capsule   0     Allergies Aspergum; Heparin; and Lisinopril  Family History  Problem Relation Age of Onset  . Seizures Father 91    Deceased from complications with seizures  . Heart disease Mother 79    Deceased    Social History Social History  Substance Use Topics  . Smoking status: Former Smoker -- 0.00 packs/day    Types: Cigarettes  . Smokeless tobacco: Not on file     Comment: 30 pack-year history. Quit 8-9 years ago.   . Alcohol Use: No     Review of Systems  Constitutional: Negative for fever. Eyes: Negative for visual changes. ENT: Negative for sore throat. Cardiovascular: Positive for chest pain. Respiratory: Positive for shortness of breath. Gastrointestinal: Negative for abdominal pain, vomiting and diarrhea. Genitourinary: Negative for dysuria. Musculoskeletal: Negative for back pain. Skin: Negative for rash. Neurological: Negative for headaches, focal weakness or numbness.   10-point ROS otherwise negative.  ____________________________________________   PHYSICAL EXAM:  VITAL SIGNS: ED Triage Vitals  Enc Vitals Group     BP 07/30/15 2229 180/70 mmHg     Pulse Rate 07/30/15 2229 69     Resp 07/30/15 2229 18     Temp 07/30/15 2229 97.8 F (36.6 C)     Temp src --      SpO2 07/30/15 2229 97 %     Weight --      Height --      Head Cir --      Peak Flow --      Pain Score 07/30/15 2227 10     Pain Loc --      Pain Edu? --      Excl. in [place]? --      Constitutional: Alert and oriented. Apparent discomfort Eyes: Conjunctivae are normal. PERRL. Normal extraocular movements. ENT   Head: Normocephalic and atraumatic.   Nose: No congestion/rhinnorhea.   Mouth/Throat: Mucous membranes are moist.   Neck: No stridor. Hematological/Lymphatic/Immunilogical: No cervical lymphadenopathy. Cardiovascular: Normal rate, regular rhythm. Normal and symmetric distal pulses are present in all extremities. No murmurs, rubs, or gallops. Respiratory: Normal respiratory effort without tachypnea nor retractions. Breath sounds are clear and equal bilaterally. No wheezes/rales/rhonchi. Gastrointestinal: Soft and nontender. No distention. There is no CVA tenderness. Genitourinary: deferred Musculoskeletal: Nontender with normal range of motion in all extremities. No joint effusions.  No lower extremity tenderness nor edema. Neurologic:  Normal speech and language. No gross focal neurologic deficits are  appreciated. Speech is  normal.  Skin:  Skin is warm, dry and intact. No rash noted. Psychiatric: Mood and affect are normal. Speech and behavior are normal. Patient exhibits appropriate insight and judgment.  ____________________________________________    LABS (pertinent positives/negatives)  Labs Reviewed  CBC - Abnormal; Notable for the following:    RBC 4.35 (*)    All other components within normal limits  BASIC METABOLIC PANEL - Abnormal; Notable for the following:    Glucose, Bld 163 (*)    Calcium 8.7 (*)    All other components within normal limits  TROPONIN I  HEPATIC FUNCTION PANEL     ____________________________________________   EKG  ED ECG REPORT I, Danniell Rotundo, [place] N, the attending physician, personally viewed and interpreted this ECG.   Date: 07/30/2015  EKG Time: 10:32 PM  Rate: 70  Rhythm:  sinus rhythm with bigeminy  Axis: Normal  Intervals:  ST&T Change: none   ____________________________________________    RADIOLOGY  DG Chest Port 1 View (Final result) Result time: 07/31/15 00:13:28   Final result by Rad Results In Interface (07/31/15 00:13:28)   Narrative:   CLINICAL DATA: 70 year old male with chest pain  EXAM: PORTABLE CHEST 1 VIEW  COMPARISON: Chest CT dated 03/13/2015  FINDINGS: Single-view of the chest demonstrates emphysematous changes of the lungs. There is no focal consolidation, pleural effusion, or pneumothorax. The cardiac silhouette is within normal limits. Median sternotomy wires and CABG vascular clips noted. Lower cervical spine fixation plate and screws. Partially visualized left proximal humeral orthopedic hardware. No acute fracture.  IMPRESSION: No active disease.   Electronically Signed By: Anner Crete M.D. On: 07/31/2015 00:13            INITIAL IMPRESSION / ASSESSMENT AND PLAN / ED COURSE  Pertinent labs & imaging results that were available during my care of the patient were  reviewed by me and considered in my medical decision making (see chart for details).  Given history and physical exam concern for possible cardiac etiology of chest pain as such patient discussed with Dr. Marcille Blanco for hospital admission for further evaluation and management. Of note patient did not receive it aspirin secondary to the fact that he states that he is allergic to it.  ____________________________________________   FINAL CLINICAL IMPRESSION(S) / ED DIAGNOSES  Final diagnoses:  Chest pain with moderate risk for cardiac etiology      Gregor Hams, MD 08/01/15 2242

## 2015-07-30 NOTE — ED Notes (Signed)
Pt reports chest pain mid sternal with left arm radiation starting today. Pt also report n/d.

## 2015-07-31 ENCOUNTER — Observation Stay (HOSPITAL_BASED_OUTPATIENT_CLINIC_OR_DEPARTMENT_OTHER)
Admit: 2015-07-31 | Discharge: 2015-07-31 | Disposition: A | Discharge status: Home / Self Care | Payer: Medicare Other | Attending: Physician Assistant | Admitting: Physician Assistant

## 2015-07-31 ENCOUNTER — Encounter (HOSPITAL_BASED_OUTPATIENT_CLINIC_OR_DEPARTMENT_OTHER): Payer: Medicare Other

## 2015-07-31 DIAGNOSIS — I25701 Atherosclerosis of coronary artery bypass graft(s), unspecified, with angina pectoris with documented spasm: Secondary | ICD-10-CM | POA: Diagnosis not present

## 2015-07-31 DIAGNOSIS — E118 Type 2 diabetes mellitus with unspecified complications: Secondary | ICD-10-CM | POA: Diagnosis not present

## 2015-07-31 DIAGNOSIS — E1151 Type 2 diabetes mellitus with diabetic peripheral angiopathy without gangrene: Secondary | ICD-10-CM | POA: Insufficient documentation

## 2015-07-31 DIAGNOSIS — M79602 Pain in left arm: Secondary | ICD-10-CM | POA: Insufficient documentation

## 2015-07-31 DIAGNOSIS — I2510 Atherosclerotic heart disease of native coronary artery without angina pectoris: Secondary | ICD-10-CM | POA: Insufficient documentation

## 2015-07-31 DIAGNOSIS — R079 Chest pain, unspecified: Secondary | ICD-10-CM | POA: Diagnosis not present

## 2015-07-31 DIAGNOSIS — E1165 Type 2 diabetes mellitus with hyperglycemia: Secondary | ICD-10-CM

## 2015-07-31 DIAGNOSIS — R0789 Other chest pain: Secondary | ICD-10-CM | POA: Insufficient documentation

## 2015-07-31 DIAGNOSIS — I209 Angina pectoris, unspecified: Secondary | ICD-10-CM | POA: Insufficient documentation

## 2015-07-31 DIAGNOSIS — E119 Type 2 diabetes mellitus without complications: Secondary | ICD-10-CM | POA: Insufficient documentation

## 2015-07-31 DIAGNOSIS — E785 Hyperlipidemia, unspecified: Secondary | ICD-10-CM

## 2015-07-31 DIAGNOSIS — Z794 Long term (current) use of insulin: Secondary | ICD-10-CM

## 2015-07-31 LAB — GLUCOSE, CAPILLARY
Glucose-Capillary: 169 mg/dL — ABNORMAL HIGH (ref 65–99)
Glucose-Capillary: 176 mg/dL — ABNORMAL HIGH (ref 65–99)
Glucose-Capillary: 173 mg/dL — ABNORMAL HIGH (ref 65–99)
Glucose-Capillary: 179 mg/dL — ABNORMAL HIGH (ref 65–99)
Glucose-Capillary: 188 mg/dL — ABNORMAL HIGH (ref 65–99)

## 2015-07-31 LAB — HEPATIC FUNCTION PANEL
ALT: 21 U/L (ref 17–63)
AST: 19 U/L (ref 15–41)
Albumin: 3.8 g/dL (ref 3.5–5.0)
Alkaline Phosphatase: 45 U/L (ref 38–126)
Bilirubin, Direct: 0.2 mg/dL (ref 0.1–0.5)
Indirect Bilirubin: 0.9 mg/dL (ref 0.3–0.9)
Total Bilirubin: 1.1 mg/dL (ref 0.3–1.2)
Total Protein: 6.6 g/dL (ref 6.5–8.1)

## 2015-07-31 LAB — TROPONIN I
TROPONIN I: 0.03 ng/mL (ref <0.031)
Troponin I: 0.03 ng/mL (ref <0.031)

## 2015-07-31 LAB — HEMOGLOBIN A1C: Hgb A1c MFr Bld: 6.6 % — ABNORMAL HIGH (ref 4.0–6.0)

## 2015-07-31 LAB — NM MYOCAR MULTI W/SPECT W/WALL MOTION / EF
CHL CUP NUCLEAR SRS: 0
CHL CUP NUCLEAR SSS: 0
CSEPPHR: 82 {beats}/min
LVDIAVOL: 76 mL
LVSYSVOL: 28 mL
NUC STRESS TID: 1.07
Percent HR: 54 %
Rest HR: 66 {beats}/min
SDS: 0

## 2015-07-31 LAB — BASIC METABOLIC PANEL
ANION GAP: 9 (ref 5–15)
BUN: 16 mg/dL (ref 6–20)
CHLORIDE: 104 mmol/L (ref 101–111)
CO2: 25 mmol/L (ref 22–32)
Calcium: 8.7 mg/dL — ABNORMAL LOW (ref 8.9–10.3)
Creatinine, Ser: 1.12 mg/dL (ref 0.61–1.24)
GFR calc Af Amer: >60 mL/min (ref >60)
Glucose, Bld: 163 mg/dL — ABNORMAL HIGH (ref 65–99)
POTASSIUM: 3.5 mmol/L (ref 3.5–5.1)
SODIUM: 138 mmol/L (ref 135–145)

## 2015-07-31 LAB — TSH: TSH: 1.497 u[IU]/mL (ref 0.350–4.500)

## 2015-07-31 MED ORDER — TECHNETIUM TC 99M SESTAMIBI - CARDIOLITE
32.2140 | Freq: Once | INTRAVENOUS | Status: AC | PRN
  Administered 2015-07-31: 14:00:00 32.214 via INTRAVENOUS

## 2015-07-31 MED ORDER — HYDROCHLOROTHIAZIDE 25 MG PO TABS
25.0000 mg | ORAL_TABLET | Freq: Every day | ORAL | Status: DC
  Administered 2015-07-31 – 2015-08-01 (×2): 25 mg via ORAL
  Filled 2015-07-31 (×2): qty 1

## 2015-07-31 MED ORDER — INSULIN ASPART 100 UNIT/ML ~~LOC~~ SOLN: 0.0000 [IU] | Freq: Every day | SUBCUTANEOUS | Status: DC

## 2015-07-31 MED ORDER — RISAQUAD PO CAPS
1.0000 | ORAL_CAPSULE | Freq: Two times a day (BID) | ORAL | Status: DC
  Administered 2015-07-31 – 2015-08-01 (×4): 1 via ORAL
  Filled 2015-07-31 (×4): qty 1

## 2015-07-31 MED ORDER — ACETAMINOPHEN 325 MG PO TABS
975.0000 mg | ORAL_TABLET | Freq: Three times a day (TID) | ORAL | Status: DC | PRN
  Administered 2015-07-31 (×2): 975 mg via ORAL
  Filled 2015-07-31 (×2): qty 3

## 2015-07-31 MED ORDER — ALUM & MAG HYDROXIDE-SIMETH 200-200-20 MG/5ML PO SUSP: 30.0000 mL | Freq: Four times a day (QID) | ORAL | Status: DC | PRN

## 2015-07-31 MED ORDER — ONDANSETRON HCL 4 MG PO TABS
8.0000 mg | ORAL_TABLET | Freq: Three times a day (TID) | ORAL | Status: DC | PRN
  Administered 2015-07-31 (×2): 8 mg via ORAL
  Filled 2015-07-31 (×2): qty 2

## 2015-07-31 MED ORDER — HEPARIN SODIUM (PORCINE) 5000 UNIT/ML IJ SOLN
5000.0000 [IU] | Freq: Three times a day (TID) | INTRAMUSCULAR | Status: DC
  Administered 2015-07-31 – 2015-08-01 (×3): 5000 [IU] via SUBCUTANEOUS
  Filled 2015-07-31 (×4): qty 1

## 2015-07-31 MED ORDER — SODIUM CHLORIDE 0.9% FLUSH
3.0000 mL | Freq: Two times a day (BID) | INTRAVENOUS | Status: DC
  Administered 2015-07-31 (×3): 3 mL via INTRAVENOUS

## 2015-07-31 MED ORDER — MORPHINE SULFATE ER 15 MG PO TBCR
15.0000 mg | EXTENDED_RELEASE_TABLET | Freq: Three times a day (TID) | ORAL | Status: DC
  Administered 2015-07-31 (×2): 15 mg via ORAL
  Filled 2015-07-31 (×3): qty 1

## 2015-07-31 MED ORDER — SODIUM CHLORIDE 0.9 % IV SOLN
INTRAVENOUS | Status: DC
Start: 2015-07-31 — End: 2015-08-01
  Administered 2015-07-31 – 2015-08-01 (×3): via INTRAVENOUS

## 2015-07-31 MED ORDER — HYDRALAZINE HCL 20 MG/ML IJ SOLN
10.0000 mg | INTRAMUSCULAR | Status: DC | PRN
  Administered 2015-07-31: 10 mg via INTRAVENOUS
  Filled 2015-07-31: qty 1

## 2015-07-31 MED ORDER — NITROGLYCERIN 2 % TD OINT
0.5000 [in_us] | TOPICAL_OINTMENT | Freq: Once | TRANSDERMAL | Status: AC
  Administered 2015-07-31: 0.5 [in_us] via TOPICAL

## 2015-07-31 MED ORDER — CARVEDILOL 12.5 MG PO TABS: 37.5000 mg | ORAL_TABLET | Freq: Two times a day (BID) | ORAL | Status: DC

## 2015-07-31 MED ORDER — ATORVASTATIN CALCIUM 20 MG PO TABS
80.0000 mg | ORAL_TABLET | Freq: Every day | ORAL | Status: DC
  Administered 2015-07-31: 80 mg via ORAL
  Filled 2015-07-31: qty 4
  Filled 2015-07-31: qty 2

## 2015-07-31 MED ORDER — PANTOPRAZOLE SODIUM 40 MG PO TBEC
40.0000 mg | DELAYED_RELEASE_TABLET | Freq: Every day | ORAL | Status: DC
  Administered 2015-07-31 – 2015-08-01 (×2): 40 mg via ORAL
  Filled 2015-07-31 (×2): qty 1

## 2015-07-31 MED ORDER — NITROGLYCERIN 2 % TD OINT
TOPICAL_OINTMENT | TRANSDERMAL | Status: AC
  Administered 2015-07-31: 0.5 [in_us] via TOPICAL
  Filled 2015-07-31: qty 1

## 2015-07-31 MED ORDER — KETOROLAC TROMETHAMINE 30 MG/ML IJ SOLN
30.0000 mg | Freq: Four times a day (QID) | INTRAMUSCULAR | Status: DC | PRN
  Administered 2015-07-31 – 2015-08-01 (×3): 30 mg via INTRAVENOUS
  Filled 2015-07-31 (×5): qty 1

## 2015-07-31 MED ORDER — MORPHINE SULFATE (PF) 2 MG/ML IV SOLN
2.0000 mg | INTRAVENOUS | Status: DC | PRN
  Administered 2015-07-31 (×2): 2 mg via INTRAVENOUS
  Filled 2015-07-31 (×3): qty 1

## 2015-07-31 MED ORDER — AMLODIPINE BESYLATE 5 MG PO TABS
5.0000 mg | ORAL_TABLET | Freq: Every day | ORAL | Status: DC
Start: 2015-07-31 — End: 2015-08-01
  Administered 2015-07-31 – 2015-08-01 (×2): 5 mg via ORAL
  Filled 2015-07-31 (×2): qty 1

## 2015-07-31 MED ORDER — DOCUSATE SODIUM 100 MG PO CAPS
100.0000 mg | ORAL_CAPSULE | Freq: Two times a day (BID) | ORAL | Status: DC
  Administered 2015-07-31 – 2015-08-01 (×4): 100 mg via ORAL
  Filled 2015-07-31 (×4): qty 1

## 2015-07-31 MED ORDER — MAGNESIUM OXIDE 400 (241.3 MG) MG PO TABS
400.0000 mg | ORAL_TABLET | Freq: Two times a day (BID) | ORAL | Status: DC
  Administered 2015-07-31 – 2015-08-01 (×4): 400 mg via ORAL
  Filled 2015-07-31 (×4): qty 1

## 2015-07-31 MED ORDER — TECHNETIUM TC 99M SESTAMIBI - CARDIOLITE
13.3000 | Freq: Once | INTRAVENOUS | Status: AC | PRN
  Administered 2015-07-31: 13:00:00 13.33 via INTRAVENOUS

## 2015-07-31 MED ORDER — REGADENOSON 0.4 MG/5ML IV SOLN
0.4000 mg | Freq: Once | INTRAVENOUS | Status: AC
  Administered 2015-07-31: 0.4 mg via INTRAVENOUS
  Filled 2015-07-31: qty 5

## 2015-07-31 MED ORDER — HYDRALAZINE HCL 25 MG PO TABS
25.0000 mg | ORAL_TABLET | Freq: Three times a day (TID) | ORAL | Status: DC
  Administered 2015-07-31 – 2015-08-01 (×4): 25 mg via ORAL
  Filled 2015-07-31 (×4): qty 1

## 2015-07-31 MED ORDER — CLOPIDOGREL BISULFATE 75 MG PO TABS
75.0000 mg | ORAL_TABLET | Freq: Every day | ORAL | Status: DC
  Administered 2015-07-31 – 2015-08-01 (×2): 75 mg via ORAL
  Filled 2015-07-31 (×2): qty 1

## 2015-07-31 MED ORDER — CARVEDILOL 25 MG PO TABS
37.5000 mg | ORAL_TABLET | Freq: Two times a day (BID) | ORAL | Status: DC
  Administered 2015-07-31 – 2015-08-01 (×2): 37.5 mg via ORAL
  Filled 2015-07-31 (×2): qty 1

## 2015-07-31 MED ORDER — INSULIN ASPART 100 UNIT/ML ~~LOC~~ SOLN
0.0000 [IU] | Freq: Three times a day (TID) | SUBCUTANEOUS | Status: DC
  Administered 2015-07-31 – 2015-08-01 (×4): 2 [IU] via SUBCUTANEOUS
  Administered 2015-08-01: 3 [IU] via SUBCUTANEOUS
  Filled 2015-07-31 (×2): qty 2
  Filled 2015-07-31: qty 3
  Filled 2015-07-31 (×2): qty 2

## 2015-07-31 NOTE — Consult Note (Signed)
Cardiology Consultation Note  Patient ID: Bryan Scott, MRN: [id number], DOB/AGE: [birth year] 70 y.o. Admit date: 07/30/2015   Date of Consult: 07/31/2015 Primary Physician: No primary care provider on file. Primary Cardiologist: New to [facility]  Chief Complaint: Chest pain Reason for Consult: Chest pain  HPI: 70 y.o. male with h/o CAD s/p 3 vessel CABG in 2006 at [facility], PAF dating back to at least 2014 on Xarelto, ischemic cardiomyopathy, multiple prior admissions for STEMI/NSTEMI/unstable angina, intolerance to heparin, carotid artery disease s/p left stenting in 2014, bladder CA, DM2, history of tobacco abuse, HTN, HLD, angioedema to ACEi, who presented to [facility] on 07/31/15 with sharp chest pain.   Prior cardiac cath by Dr. Clayborn Bigness in 03/2005 which showed multivessel CAD. He underwent cardiac bypass surgery in 03/2015 with a LIMA-LAD, SVG-OM1, and SVG-ramus. He subsequently prevented to [facility] one month later in 04/2015 with unstable angina and was found to have significant stenosis of the SVG-ramus and was transferred to [facility] for stenting to the ramus. Repeat admission in 2009 for unstable angina with negative nuclear stress test. Repeat admission 05/2009 for NSTEMI and dysphagia. Cardaic cath showed patent LIMA-LAD, SVG-OM1, SVG-distal RCA, nonobstructive 60% distal PDA, EF 55%. He did receive heparin without issues. He was again admitted 03/2010 for anterior ST elevation MI. At that time per notes it appeared he reportedly had subsequently had further PCI at [facility] to the Left Main. Cardiac also showed changes to LIMA-LAD which was now atretic, and he now had an occluded SVG-OM1. He underwent cardiac cath for his anterior wall ST elevation MI that showed significant CAD with no clear infarct vessel. He was continued to medical therapy and transferred to the [facility] [facility] per his choice. Admitted again in 02/2013 for inferior ST elevation MI, though Q waves were already present. Cardiac cath showed no  changes from cath to 2011 with patent SVG to OM, SVG to RCA. Occluded LIMA-LAD, and SVG to OM1. Medical Rx recommended. He is followed by the [place],  He developed substernal chest pain radiating to his left arm overnight on 2/13. Pain was sharp and similar to his prior events. He was sitting down in his recliner when symptoms began. He has continued to have symptoms into this morning. No associated SOB or tachy-palpitations. Pain has subsequently radiated down into his belly. He denies any diarrhea, though did have a BM this morning. Some nausea. Upon the patient's arrival to [facility] they were found to have troponin negative x 2. ECG showed NSR, 70 bpm, ventricular bigeminy nonspecific st/t changes, prior infarct, CXR showed no acute disease. He continues to have chest pain.    Past Medical History  Diagnosis Date  . Hypertension   . GERD (gastroesophageal reflux disease)   . Chronic pain syndrome   . Hyperlipidemia   . Coronary artery disease     s/p CABG ~ 2007  . History of tobacco use   . Type 2 diabetes mellitus (HCC)     A1C 6.8% in 01/2013  . Carotid artery disease (HCC)     > 75% bilateral ICA stenoses on 01/2013 CT  . 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 Heparin allergy     Bleeding  . Paroxysmal atrial fibrillation (HCC)     On Xarelto anticoagulation  . Cancer ([place])   . CHF (congestive heart failure) ([place])       Most Recent Cardiac Studies: As above   Surgical History:  Past  Surgical History  Procedure Laterality Date  . Cardiac catheterization    . Coronary artery bypass graft  2007     Home Meds: Prior to Admission medications   Medication Sig Start Date End Date Taking? Authorizing Provider  acetaminophen (TYLENOL) 325 MG tablet Take 975 mg by mouth 3 (three) times daily as needed for mild pain or fever.   Yes Historical Provider, MD  alum & mag hydroxide-simeth (MAALOX/MYLANTA) 200-200-20 MG/5ML suspension Take 30 mLs by mouth every 6 (six)  hours as needed for indigestion or heartburn. 03/16/15  Yes Nicholes Mango, MD  amLODipine (NORVASC) 5 MG tablet Take 1 tablet (5 mg total) by mouth daily. 03/16/15  Yes Nicholes Mango, MD  carvedilol (COREG) 12.5 MG tablet Take 3 tablets (37.5 mg total) by mouth every 12 (twelve) hours. 03/16/15  Yes Nicholes Mango, MD  clopidogrel (PLAVIX) 75 MG tablet Take 75 mg by mouth daily.   Yes Historical Provider, MD  hydrALAZINE (APRESOLINE) 25 MG tablet Take 1 tablet (25 mg total) by mouth 3 (three) times daily. Patient taking differently: Take 25 mg by mouth 2 (two) times daily.  03/16/15  Yes Nicholes Mango, MD  hydrochlorothiazide (HYDRODIURIL) 25 MG tablet Take 25 mg by mouth daily.   Yes Historical Provider, MD  magnesium oxide (MAG-OX) 400 MG tablet Take 400 mg by mouth 2 (two) times daily.    Yes Historical Provider, MD  metFORMIN (GLUCOPHAGE) 1000 MG tablet Take 1,000 mg by mouth 2 (two) times daily.   Yes Historical Provider, MD  morphine (MS CONTIN) 15 MG 12 hr tablet Take 15 mg by mouth every 8 (eight) hours.   Yes Historical Provider, MD  ondansetron (ZOFRAN) 8 MG tablet Take 8 mg by mouth every 8 (eight) hours as needed for nausea or vomiting.    Yes Historical Provider, MD  pantoprazole (PROTONIX) 40 MG tablet Take 1 tablet (40 mg total) by mouth daily. 03/16/15  Yes Nicholes Mango, MD  saccharomyces boulardii (FLORASTOR) 250 MG capsule Take 1 capsule (250 mg total) by mouth 2 (two) times daily. 03/16/15  Yes Nicholes Mango, MD    Inpatient Medications:  . acidophilus  1 capsule Oral BID  . amLODipine  5 mg Oral Daily  . carvedilol  37.5 mg Oral Q12H  . clopidogrel  75 mg Oral Daily  . docusate sodium  100 mg Oral BID  . heparin  5,000 Units Subcutaneous 3 times per day  . hydrALAZINE  25 mg Oral TID  . hydrochlorothiazide  25 mg Oral Daily  . insulin aspart  0-5 Units Subcutaneous QHS  . insulin aspart  0-9 Units Subcutaneous TID WC  . magnesium oxide  400 mg Oral BID  . morphine  15 mg Oral 3 times per  day  . pantoprazole  40 mg Oral QAC breakfast  . sodium chloride flush  3 mL Intravenous Q12H   . sodium chloride 100 mL/hr at 07/31/15 0409    Allergies:  Allergies  Allergen Reactions  . 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 Heparin Other (See Comments)    Reaction:  Bleeding   . Lisinopril Swelling    Social History   Social History  . Marital Status: Married    Spouse Name: N/A  . Number of Children: N/A  . Years of Education: N/A   Occupational History  . Not on file.   Social History Main Topics  . Smoking status: Former Smoker -- 0.00 packs/day    Types: Cigarettes  . Smokeless tobacco: Not on  file     Comment: 30 pack-year history. Quit 8-9 years ago.   . Alcohol Use: No  . Drug Use: No  . Sexual Activity: Not on file   Other Topics Concern  . Not on file   Social History Narrative     Family History  Problem Relation Age of Onset  . Seizures Father 49    Deceased from complications with seizures  . Heart disease Mother 88    Deceased     Review of Systems: Review of Systems  Constitutional: Positive for malaise/fatigue. Negative for fever, chills, weight loss and diaphoresis.  HENT: Negative for congestion.   Eyes: Negative for discharge and redness.  Respiratory: Positive for shortness of breath. Negative for cough, hemoptysis, sputum production and wheezing.   Cardiovascular: Positive for chest pain. Negative for palpitations, orthopnea, claudication, leg swelling and PND.  Gastrointestinal: Positive for nausea and abdominal pain. Negative for heartburn, vomiting, diarrhea and constipation.  Musculoskeletal: Positive for back pain and joint pain. Negative for myalgias and falls.       Neck pain  Skin: Negative for rash.  Neurological: Positive for weakness. Negative for dizziness, tingling, tremors, sensory change, speech change, focal weakness and loss of consciousness.  Endo/Heme/Allergies: Does not bruise/bleed easily.  Psychiatric/Behavioral:  Negative for substance abuse. The patient is not nervous/anxious.   All other systems reviewed and are negative.   Labs:  Recent Labs  07/31/15 0029 07/31/15 0912  TROPONINI 0.03 0.03   Lab Results  Component Value Date   WBC 8.0 07/30/2015   HGB 14.3 07/30/2015   HCT 40.8 07/30/2015   MCV 93.8 07/30/2015   PLT 170 07/30/2015    Recent Labs Lab 07/31/15 0029  NA 138  K 3.5  CL 104  CO2 25  BUN 16  CREATININE 1.12  CALCIUM 8.7*  PROT 6.6  BILITOT 1.1  ALKPHOS 45  ALT 21  AST 19  GLUCOSE 163*   Lab Results  Component Value Date   CHOL 141 03/14/2015   HDL 36* 03/14/2015   LDLCALC 63 03/14/2015   TRIG 211* 03/14/2015   No results found for: DDIMER  Radiology/Studies:  Dg Chest Port 1 View  07/31/2015  CLINICAL DATA:  70 year old male with chest pain EXAM: PORTABLE CHEST 1 VIEW COMPARISON:  Chest CT dated 03/13/2015 FINDINGS: Single-view of the chest demonstrates emphysematous changes of the lungs. There is no focal consolidation, pleural effusion, or pneumothorax. The cardiac silhouette is within normal limits. Median sternotomy wires and CABG vascular clips noted. Lower cervical spine fixation plate and screws. Partially visualized left proximal humeral orthopedic hardware. No acute fracture. IMPRESSION: No active disease. Electronically Signed   By: Anner Crete M.D.   On: 07/31/2015 00:13    EKG: NSR, 70 bpm, ventricular bigeminy nonspecific st/t changes, prior infarct  Weights: Filed Weights   07/31/15 0334  Weight: 215 lb 3.2 oz (97.614 kg)     Physical Exam: Blood pressure 132/74, pulse 63, temperature 97.8 F (36.6 C), resp. rate 18, weight 215 lb 3.2 oz (97.614 kg), SpO2 98 %. Body mass index is 29.18 kg/(m^2). General: Well developed, well nourished, in no acute distress. Head: Normocephalic, atraumatic, sclera non-icteric, no xanthomas, nares are without discharge.  Neck: Negative for carotid bruits. JVD not elevated. Lungs: Clear  bilaterally to auscultation without wheezes, rales, or rhonchi. Breathing is unlabored. Heart: RRR with S1 S2. No murmurs, rubs, or gallops appreciated. Abdomen: Soft, non-tender, non-distended with normoactive bowel sounds. No hepatomegaly. No rebound/guarding. No obvious  abdominal masses. Msk:  Strength and tone appear normal for age. Extremities: No clubbing or cyanosis. No edema.  Distal pedal pulses are 2+ and equal bilaterally. Neuro: Alert and oriented X 3. No facial asymmetry. No focal deficit. Moves all extremities spontaneously. Psych:  Responds to questions appropriately with a normal affect.    Assessment and Plan:   1. Unstable angina/CAD s/p CABG and PCI as above: -Troponin negative x 2 -Schedule for Lexiscan today. He is unable to treadmill 2/2 chronic back pain -Check echo to evaluate LV function, wall motion, valvular function, and right-sided pressure -He does not tolerate heparin, avoid usage -Continue Plavix, Coreg -Not on aspirin 2/2 history of anaphylaxis in chart, defer to [place] 80 mg  2. Presumed ischemic cardiomyopathy: -Coreg as above -He does not appear volume overloaded  3. PAF: -In sinus rhythm -Coreg as above -Xarelto 20 q dinner -CHADS2VASc 5 (CHF, HTN, age x 1, DM, vascular disease)  5. HTN: -Allergy to ACEi -Continue current medications  6. HLD: -Add Lipitor  7. Dispo: -Follow up with [facility]   Signed, Christell Faith, PA-C Pager: [phone] 07/31/2015, 10:45 AM

## 2015-07-31 NOTE — H&P (Signed)
Bryan Scott is an 70 y.o. male.   Chief Complaint: Chest pain HPI: The patient presents emergency department via EMS complaining of chest pain. His pain began at rest and his left shoulder and radiated into his chest. The patient states that it feels like somebody is "stabbing him with an ice pick". He admits to pain radiating into his upper chest and neck. Shortly after the pain began the patient became nauseous and also had 2 loose bowel movements. The patient did not receive aspirin in the emergency department due to anaphylactic allergy listed on his chart. (His primary care doctor discontinued his daily aspirin use as well). His pain was slightly relieved by sublingual nitroglycerin. She continued to have chest pain in the emergency department admitting physician added nitroglycerin glycerin paste to his chest. Due to his history of coronary artery disease status post CABG and continued chest pain the emergency department staff called for admission.  Past Medical History  Diagnosis Date  . Hypertension   . GERD (gastroesophageal reflux disease)   . Chronic pain syndrome   . Hyperlipidemia   . Coronary artery disease     s/p CABG ~ 2007  . History of tobacco use   . Type 2 diabetes mellitus (HCC)     A1C 6.8% in 01/2013  . Carotid artery disease (HCC)     > 75% bilateral ICA stenoses on 01/2013 CT  . 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 Heparin allergy     Bleeding  . Paroxysmal atrial fibrillation (HCC)     On Xarelto anticoagulation  . Cancer ([place])   . CHF (congestive heart failure) [facility])     Past Surgical History  Procedure Laterality Date  . Cardiac catheterization    . Coronary artery bypass graft  2007    Family History  Problem Relation Age of Onset  . Seizures Father 68    Deceased from complications with seizures  . Heart disease Mother 62    Deceased   Social History:  reports that he has quit smoking. His smoking use included Cigarettes. He smoked 0.00  packs per day. He does not have any smokeless tobacco history on file. He reports that he does not drink alcohol or use illicit drugs.  Allergies:  Allergies  Allergen Reactions  . 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 Heparin Other (See Comments)    Reaction:  Bleeding   . Lisinopril Swelling    Medications Prior to Admission  Medication Sig Dispense Refill  . acetaminophen (TYLENOL) 325 MG tablet Take 975 mg by mouth 3 (three) times daily as needed for mild pain or fever.    Marland Kitchen alum & mag hydroxide-simeth (MAALOX/MYLANTA) 200-200-20 MG/5ML suspension Take 30 mLs by mouth every 6 (six) hours as needed for indigestion or heartburn. 355 mL 0  . amLODipine (NORVASC) 5 MG tablet Take 1 tablet (5 mg total) by mouth daily. 30 tablet 0  . carvedilol (COREG) 12.5 MG tablet Take 3 tablets (37.5 mg total) by mouth every 12 (twelve) hours. 60 tablet 0  . clopidogrel (PLAVIX) 75 MG tablet Take 75 mg by mouth daily.    . hydrALAZINE (APRESOLINE) 25 MG tablet Take 1 tablet (25 mg total) by mouth 3 (three) times daily. (Patient taking differently: Take 25 mg by mouth 2 (two) times daily. ) 90 tablet 0  . hydrochlorothiazide (HYDRODIURIL) 25 MG tablet Take 25 mg by mouth daily.    . magnesium oxide (MAG-OX) 400 MG tablet Take 400  mg by mouth 2 (two) times daily.     . metFORMIN (GLUCOPHAGE) 1000 MG tablet Take 1,000 mg by mouth 2 (two) times daily.    Marland Kitchen morphine (MS CONTIN) 15 MG 12 hr tablet Take 15 mg by mouth every 8 (eight) hours.    . ondansetron (ZOFRAN) 8 MG tablet Take 8 mg by mouth every 8 (eight) hours as needed for nausea or vomiting.     . pantoprazole (PROTONIX) 40 MG tablet Take 1 tablet (40 mg total) by mouth daily. 30 tablet 0  . saccharomyces boulardii (FLORASTOR) 250 MG capsule Take 1 capsule (250 mg total) by mouth 2 (two) times daily. 30 capsule 0    Results for orders placed or performed during the hospital encounter of 07/30/15 (from the past 48 hour(s))  CBC     Status: Abnormal    Collection Time: 07/30/15 10:35 PM  Result Value Ref Range   WBC 8.0 3.8 - 10.6 K/uL   RBC 4.35 (L) 4.40 - 5.90 MIL/uL   Hemoglobin 14.3 13.0 - 18.0 g/dL   HCT 40.8 40.0 - 52.0 %   MCV 93.8 80.0 - 100.0 fL   MCH 32.8 26.0 - 34.0 pg   MCHC 35.0 32.0 - 36.0 g/dL   RDW 13.4 11.5 - 14.5 %   Platelets 170 150 - 440 K/uL  Basic metabolic panel     Status: Abnormal   Collection Time: 07/31/15 12:29 AM  Result Value Ref Range   Sodium 138 135 - 145 mmol/L   Potassium 3.5 3.5 - 5.1 mmol/L   Chloride 104 101 - 111 mmol/L   CO2 25 22 - 32 mmol/L   Glucose, Bld 163 (H) 65 - 99 mg/dL   BUN 16 6 - 20 mg/dL   Creatinine, Ser 1.12 0.61 - 1.24 mg/dL   Calcium 8.7 (L) 8.9 - 10.3 mg/dL   GFR calc non Af Amer >60 >60 mL/min   GFR calc Af Amer >60 >60 mL/min    Comment: (NOTE) The eGFR has been calculated using the CKD EPI equation. This calculation has not been validated in all clinical situations. eGFR's persistently <60 mL/min signify possible Chronic Kidney Disease.    Anion gap 9 5 - 15  Troponin I     Status: None   Collection Time: 07/31/15 12:29 AM  Result Value Ref Range   Troponin I 0.03 <0.031 ng/mL    Comment:        NO INDICATION OF MYOCARDIAL INJURY.   Hepatic function panel     Status: None   Collection Time: 07/31/15 12:29 AM  Result Value Ref Range   Total Protein 6.6 6.5 - 8.1 g/dL   Albumin 3.8 3.5 - 5.0 g/dL   AST 19 15 - 41 U/L   ALT 21 17 - 63 U/L   Alkaline Phosphatase 45 38 - 126 U/L   Total Bilirubin 1.1 0.3 - 1.2 mg/dL   Bilirubin, Direct 0.2 0.1 - 0.5 mg/dL   Indirect Bilirubin 0.9 0.3 - 0.9 mg/dL  TSH     Status: None   Collection Time: 07/31/15 12:29 AM  Result Value Ref Range   TSH 1.497 0.350 - 4.500 uIU/mL   Dg Chest Port 1 View  07/31/2015  CLINICAL DATA:  70 year old male with chest pain EXAM: PORTABLE CHEST 1 VIEW COMPARISON:  Chest CT dated 03/13/2015 FINDINGS: Single-view of the chest demonstrates emphysematous changes of the lungs. There is no  focal consolidation, pleural effusion, or pneumothorax. The cardiac silhouette is  within normal limits. Median sternotomy wires and CABG vascular clips noted. Lower cervical spine fixation plate and screws. Partially visualized left proximal humeral orthopedic hardware. No acute fracture. IMPRESSION: No active disease. Electronically Signed   By: Anner Crete M.D.   On: 07/31/2015 00:13    Review of Systems  Constitutional: Negative for fever and chills.  HENT: Negative for sore throat and tinnitus.   Eyes: Negative for blurred vision and redness.  Respiratory: Negative for cough and shortness of breath.   Cardiovascular: Positive for chest pain. Negative for palpitations, orthopnea and PND.  Gastrointestinal: Positive for nausea and vomiting. Negative for abdominal pain and diarrhea.  Genitourinary: Negative for dysuria, urgency and frequency.  Musculoskeletal: Negative for myalgias and joint pain.  Skin: Negative for rash.       No lesions  Neurological: Negative for speech change, focal weakness and weakness.  Endo/Heme/Allergies: Does not bruise/bleed easily.       No temperature intolerance  Psychiatric/Behavioral: Negative for depression and suicidal ideas.    Blood pressure 199/90, pulse 62, temperature 97.8 F (36.6 C), resp. rate 18, weight 97.614 kg (215 lb 3.2 oz), SpO2 98 %. Physical Exam  Nursing note and vitals reviewed. Constitutional: He is oriented to person, place, and time. He appears well-developed and well-nourished. No distress.  HENT:  Head: Normocephalic and atraumatic.  Mouth/Throat: Oropharynx is clear and moist.  Eyes: Conjunctivae and EOM are normal. Pupils are equal, round, and reactive to light. No scleral icterus.  Neck: Normal range of motion. Neck supple. No JVD present. No tracheal deviation present. No thyromegaly present.  Cardiovascular: Normal rate, regular rhythm and normal heart sounds.  Exam reveals no gallop and no friction rub.   No murmur  heard. Respiratory: Effort normal and breath sounds normal.  GI: Soft. Bowel sounds are normal. He exhibits no distension. There is no tenderness.  Genitourinary:  Deferred  Musculoskeletal: Normal range of motion. He exhibits no edema.  Lymphadenopathy:    He has no cervical adenopathy.  Neurological: He is alert and oriented to person, place, and time. No cranial nerve deficit.  Skin: Skin is warm and dry. No rash noted. No erythema.  Psychiatric: He has a normal mood and affect. His behavior is normal. Judgment and thought content normal.     Assessment/Plan This is a 70 year old Caucasian male with past medical history of CAD admitted for chest pain. 1. Chest pain: Atypical for cardiac pain as prolonged and associated with GI symptoms. (Symptomology is not consistent with aortic aneurysm or dissection). However, the patient has a history of coronary artery disease status post CABG 11 years ago. He has recently discontinued his aspirin as well. We will trend the patient's cardiac enzymes and monitor telemetry. EKG does not show signs of acute ischemia at this time. Cardiology has been consulted and the patient is nothing by mouth for possible heart catheterization in the morning. 2. Coronary artery disease: Continue Plavix 3. Essential hypertension: Continue Coreg, amlodipine and hydrochlorothiazide. Hydralazine when necessary elevated blood pressure 4. Diabetes mellitus type 2: Hold oral hypoglycemics including metformin. Sliding scale insulin while hospitalized 5. Chronic diarrhea: Continue probiotic 6. DVT prophylaxis: Heparin 7. GI prophylaxis: Continue pantoprazole per home regimen The patient is a full code. Time spent on admission was inpatient care approximately 45 minutes  Harrie Foreman, MD 07/31/2015, 5:25 AM

## 2015-07-31 NOTE — Care Management Obs Status (Signed)
MEDICARE OBSERVATION STATUS NOTIFICATION   Patient Details  Name: Bryan Scott MRN: [id number] Date of Birth: 04/24/1946   Medicare Observation Status Notification Given:  Yes    Katrina Stack, RN 07/31/2015, 8:21 AM

## 2015-07-31 NOTE — Progress Notes (Signed)
Dr. Rockey Situ stated stress test was normal, and does not think it is cardiac related due to results and negative troponins. Dr. Posey Pronto notified and ordered for patient to receive Toradol q6PRN and d/c discharge order. Bryan Scott

## 2015-07-31 NOTE — Progress Notes (Signed)
*  PRELIMINARY RESULTS* Echocardiogram 2D Echocardiogram has been performed.  Bryan Scott 07/31/2015, 2:35 PM

## 2015-08-01 DIAGNOSIS — R0789 Other chest pain: Secondary | ICD-10-CM | POA: Diagnosis not present

## 2015-08-01 LAB — GLUCOSE, CAPILLARY
Glucose-Capillary: 163 mg/dL — ABNORMAL HIGH (ref 65–99)
Glucose-Capillary: 246 mg/dL — ABNORMAL HIGH (ref 65–99)

## 2015-08-01 MED ORDER — NAPROXEN 500 MG PO TABS: 500.0000 mg | ORAL_TABLET | Freq: Two times a day (BID) | ORAL | Status: DC | PRN

## 2015-08-01 MED ORDER — ATORVASTATIN CALCIUM 10 MG PO TABS: 10.0000 mg | ORAL_TABLET | Freq: Every day | ORAL | Status: DC

## 2015-08-01 MED ORDER — OXYCODONE-ACETAMINOPHEN 5-325 MG PO TABS: 1.0000 | ORAL_TABLET | Freq: Three times a day (TID) | ORAL | Status: DC | PRN

## 2015-08-01 NOTE — Progress Notes (Signed)
Patient d/c'd home. Education provided, no questions at this time. Patient to be picked up by sister. Telemetry removed. Wilnette Kales

## 2015-08-01 NOTE — Progress Notes (Signed)
Patient BP elevated, morning medications given. Will reassess BP. Bryan Scott

## 2015-08-01 NOTE — Progress Notes (Signed)
Spoke with Dr. Posey Pronto about d/c'ing IVF due to hx of HF. Verbal order given to stop fluids at this time. Wilnette Kales

## 2015-08-01 NOTE — Discharge Summary (Signed)
Bryan Scott, 70 y.o., DOB May 18, 1946, MRN [id number]. Admission date: 07/30/2015 Discharge Date 08/01/2015 Primary MD [name], MD Admitting Physician Harrie Foreman, MD  Admission Diagnosis  chest pain  Discharge Diagnosis   Active Problems:   Chest pain noncardiac status post stress test with negative stress test   Coronary artery disease involving coronary bypass graft of native heart with angina pectoris with documented spasm [facility])   Uncontrolled type 2 diabetes mellitus with complication, with long-term current use of insulin (HCC)   Hyperlipidemia   Pain of left upper extremity GERD   hyperlipidemia Coronary artery stenosis Paroxysmal atrial fibrillation         Hospital Course The patient presents emergency department via EMS complaining of chest pain. His pain began at rest and his left shoulder and radiated into his chest. Patient was seen in the emergency room due to his coronary artery disease history patient was admitted to the hospital. Over observation. He continued to complain of pain therefore cardiology consult was obtained and underwent a stress test. The stress test showed no evidence of ischemia. However patient continued to have severe pain. He was tried on Toradol. And his symptoms resolved. Now he is feeling well and stable for discharge.           Consults  cardiology  Significant Tests:  See full reports for all details      Nm Myocar Multi W/spect W/wall Motion / Ef  07/31/2015  Pharmacological myocardial perfusion imaging study with no significant  ischemia Normal wall motion, EF estimated at 55% No EKG changes concerning for ischemia at peak stress or in recovery. Low risk scan Signed, Esmond Plants, MD, Ph.D [facility]   Dg Chest [place] 1 View  07/31/2015  CLINICAL DATA:  70 year old male with chest pain EXAM: PORTABLE CHEST 1 VIEW COMPARISON:  Chest CT dated 03/13/2015 FINDINGS: Single-view of the chest demonstrates emphysematous changes  of the lungs. There is no focal consolidation, pleural effusion, or pneumothorax. The cardiac silhouette is within normal limits. Median sternotomy wires and CABG vascular clips noted. Lower cervical spine fixation plate and screws. Partially visualized left proximal humeral orthopedic hardware. No acute fracture. IMPRESSION: No active disease. Electronically Signed   By: Anner Crete M.D.   On: 07/31/2015 00:13       Today   Subjective:   Bryan Scott  feels well denies any chest pain or shortness of breath currently  Objective:   Blood pressure 159/73, pulse 65, temperature 97.7 F (36.5 C), temperature source Oral, resp. rate 18, weight 96.026 kg (211 lb 11.2 oz), SpO2 100 %.  .  Intake/Output Summary (Last 24 hours) at 08/01/15 1426 Last data filed at 08/01/15 1035  Gross per 24 hour  Intake   1380 ml  Output   1775 ml  Net   -395 ml    Exam VITAL SIGNS: Blood pressure 159/73, pulse 65, temperature 97.7 F (36.5 C), temperature source Oral, resp. rate 18, weight 96.026 kg (211 lb 11.2 oz), SpO2 100 %.  GENERAL:  70 y.o.-year-old patient lying in the bed with no acute distress.  EYES: Pupils equal, round, reactive to light and accommodation. No scleral icterus. Extraocular muscles intact.  HEENT: Head atraumatic, normocephalic. Oropharynx and nasopharynx clear.  NECK:  Supple, no jugular venous distention. No thyroid enlargement, no tenderness.  LUNGS: Normal breath sounds bilaterally, no wheezing, rales,rhonchi or crepitation. No use of accessory muscles of respiration.  CARDIOVASCULAR: S1, S2 normal. No murmurs, rubs, or gallops.  ABDOMEN: Soft, nontender, nondistended. Bowel sounds present. No organomegaly or mass.  EXTREMITIES: No pedal edema, cyanosis, or clubbing.  NEUROLOGIC: Cranial nerves II through XII are intact. Muscle strength 5/5 in all extremities. Sensation intact. Gait not checked.  PSYCHIATRIC: The patient is alert and oriented x 3.  SKIN: No obvious rash,  lesion, or ulcer.   Data Review     CBC w Diff: Lab Results  Component Value Date   WBC 8.0 07/30/2015   WBC 11.9* 10/24/2013   HGB 14.3 07/30/2015   HGB 14.9 10/24/2013   HCT 40.8 07/30/2015   HCT 44.7 10/24/2013   PLT 170 07/30/2015   PLT 212 10/24/2013   LYMPHOPCT 39.8 10/24/2013   MONOPCT 8.0 10/24/2013   EOSPCT 2.1 10/24/2013   BASOPCT 0.2 10/24/2013   CMP: Lab Results  Component Value Date   NA 138 07/31/2015   NA 131* 10/25/2013   K 3.5 07/31/2015   K 3.9 10/25/2013   CL 104 07/31/2015   CL 97* 10/25/2013   CO2 25 07/31/2015   CO2 26 10/25/2013   BUN 16 07/31/2015   BUN 22* 10/25/2013   CREATININE 1.12 07/31/2015   CREATININE 1.58* 10/25/2013   PROT 6.6 07/31/2015   PROT 7.3 02/15/2013   ALBUMIN 3.8 07/31/2015   ALBUMIN 3.3* 02/15/2013   BILITOT 1.1 07/31/2015   BILITOT 1.2* 02/15/2013   ALKPHOS 45 07/31/2015   ALKPHOS 94 02/15/2013   AST 19 07/31/2015   AST 23 02/15/2013   ALT 21 07/31/2015   ALT 32 02/15/2013  .  Micro Results No results found for this or any previous visit (from the past 240 hour(s)).      Code Status Orders        Start     Ordered   07/31/15 0333  Full code   Continuous     07/31/15 0332    Code Status History    Date Active Date Inactive Code Status Order ID Comments User Context   03/13/2015  2:51 AM 03/16/2015  5:26 PM Full Code [id number]  Lytle Butte, MD ED          Follow-up Information    Follow up with Ida Rogue, MD In 3 weeks.   Specialty:  Cardiology   Why:  Tuesday, February 21st at 415pm, ccs   Contact information:   [place] [address] [phone]       Follow up with [name], MD On 08/14/2015.   Specialty:  Family Medicine   Why:  at 8:45am   Contact information:   [place] [address] [phone]       Discharge Medications     Medication List    TAKE these medications        acetaminophen 325 MG tablet  Commonly  known as:  TYLENOL  Take 975 mg by mouth 3 (three) times daily as needed for mild pain or fever.     alum & mag hydroxide-simeth 200-200-20 MG/5ML suspension  Commonly known as:  MAALOX/MYLANTA  Take 30 mLs by mouth every 6 (six) hours as needed for indigestion or heartburn.     amLODipine 5 MG tablet  Commonly known as:  NORVASC  Take 1 tablet (5 mg total) by mouth daily.     atorvastatin 10 MG tablet  Commonly known as:  LIPITOR  Take 1 tablet (10 mg total) by mouth daily.     carvedilol 12.5 MG tablet  Commonly known  as:  COREG  Take 3 tablets (37.5 mg total) by mouth every 12 (twelve) hours.     clopidogrel 75 MG tablet  Commonly known as:  PLAVIX  Take 75 mg by mouth daily.     hydrALAZINE 25 MG tablet  Commonly known as:  APRESOLINE  Take 1 tablet (25 mg total) by mouth 3 (three) times daily.     hydrochlorothiazide 25 MG tablet  Commonly known as:  HYDRODIURIL  Take 25 mg by mouth daily.     magnesium oxide 400 MG tablet  Commonly known as:  MAG-OX  Take 400 mg by mouth 2 (two) times daily.     metFORMIN 1000 MG tablet  Commonly known as:  GLUCOPHAGE  Take 1,000 mg by mouth 2 (two) times daily.     morphine 15 MG 12 hr tablet  Commonly known as:  MS CONTIN  Take 15 mg by mouth every 8 (eight) hours.     naproxen 500 MG tablet  Commonly known as:  NAPROSYN  Take 1 tablet (500 mg total) by mouth 2 (two) times daily as needed.     ondansetron 8 MG tablet  Commonly known as:  ZOFRAN  Take 8 mg by mouth every 8 (eight) hours as needed for nausea or vomiting.     oxyCODONE-acetaminophen 5-325 MG tablet  Commonly known as:  PERCOCET  Take 1 tablet by mouth every 8 (eight) hours as needed for severe pain.     pantoprazole 40 MG tablet  Commonly known as:  PROTONIX  Take 1 tablet (40 mg total) by mouth daily.     saccharomyces boulardii 250 MG capsule  Commonly known as:  FLORASTOR  Take 1 capsule (250 mg total) by mouth 2 (two) times daily.            Total Time in preparing paper work, data evaluation and todays exam - 35 minutes  Dustin Flock M.D on 08/01/2015 at 2:26 [facility]  [facility]   Office  [phone]

## 2015-08-01 NOTE — Discharge Instructions (Signed)

## 2015-08-01 NOTE — Progress Notes (Signed)
[place] at [facility]                                                                                                                                                                                            Patient Demographics   Bryan Scott, is a 70 y.o. male, DOB - [birth year], [id number]  Admit date - 07/30/2015   Admitting Physician Harrie Foreman, MD  Outpatient Primary MD for the patient is Bryan Lovie Macadamia, MD   LOS -   Subjective: Patient's awaiting stress test. Is complaining of severe she chest pain     Review of Systems:   CONSTITUTIONAL: No documented fever. No fatigue, weakness. No weight gain, no weight loss.  EYES: No blurry or double vision.  ENT: No tinnitus. No postnasal drip. No redness of the oropharynx.  RESPIRATORY: No cough, no wheeze, no hemoptysis. No dyspnea.  CARDIOVASCULAR: Positive chest pain. No orthopnea. No palpitations. No syncope.  GASTROINTESTINAL: No nausea, no vomiting or diarrhea. No abdominal pain. No melena or hematochezia.  GENITOURINARY: No dysuria or hematuria.  ENDOCRINE: No polyuria or nocturia. No heat or cold intolerance.  HEMATOLOGY: No anemia. No bruising. No bleeding.  INTEGUMENTARY: No rashes. No lesions.  MUSCULOSKELETAL: No arthritis. No swelling. No gout.  NEUROLOGIC: No numbness, tingling, or ataxia. No seizure-type activity.  PSYCHIATRIC: No anxiety. No insomnia. No ADD.    Vitals:   Filed Vitals:   08/01/15 0500 08/01/15 0536 08/01/15 0832 08/01/15 1145  BP:  163/77 169/103 159/73  Pulse:  62 72 65  Temp:  97.8 F (36.6 C)  97.7 F (36.5 C)  TempSrc:  Oral  Oral  Resp:    18  Weight: 96.026 kg (211 lb 11.2 oz)     SpO2:  98%  100%    Wt Readings from Last 3 Encounters:  08/01/15 96.026 kg (211 lb 11.2 oz)  03/13/15 98.93 kg (218 lb 1.6 oz)     Intake/Output Summary (Last 24 hours) at 08/01/15 1423 Last data filed at 08/01/15 1035  Gross per 24 hour  Intake    1380 ml  Output   1775 ml  Net   -395 ml    Physical Exam:   GENERAL: Pleasant-appearing in no apparent distress.  HEAD, EYES, EARS, NOSE AND THROAT: Atraumatic, normocephalic. Extraocular muscles are intact. Pupils equal and reactive to light. Sclerae anicteric. No conjunctival injection. No oro-pharyngeal erythema.  NECK: Supple. There is no jugular venous distention. No bruits, no lymphadenopathy, no thyromegaly.  HEART: Regular rate and rhythm,. No murmurs, no rubs, no clicks.  LUNGS: Clear to auscultation bilaterally. No rales  or rhonchi. No wheezes.  ABDOMEN: Soft, flat, nontender, nondistended. Has good bowel sounds. No hepatosplenomegaly appreciated.  EXTREMITIES: No evidence of any cyanosis, clubbing, or peripheral edema.  +2 pedal and radial pulses bilaterally.  NEUROLOGIC: The patient is alert, awake, and oriented x3 with no focal motor or sensory deficits appreciated bilaterally.  SKIN: Moist and warm with no rashes appreciated.  Psych: Not anxious, depressed LN: No inguinal LN enlargement    Antibiotics   Anti-infectives    None      Medications   Scheduled Meds: . acidophilus  1 capsule Oral BID  . amLODipine  5 mg Oral Daily  . atorvastatin  80 mg Oral q1800  . carvedilol  37.5 mg Oral BID WC  . clopidogrel  75 mg Oral Daily  . docusate sodium  100 mg Oral BID  . heparin  5,000 Units Subcutaneous 3 times per day  . hydrALAZINE  25 mg Oral TID  . hydrochlorothiazide  25 mg Oral Daily  . insulin aspart  0-5 Units Subcutaneous QHS  . insulin aspart  0-9 Units Subcutaneous TID WC  . magnesium oxide  400 mg Oral BID  . morphine  15 mg Oral 3 times per day  . pantoprazole  40 mg Oral QAC breakfast  . sodium chloride flush  3 mL Intravenous Q12H   Continuous Infusions:  PRN Meds:.acetaminophen, alum & mag hydroxide-simeth, hydrALAZINE, ketorolac, morphine injection, ondansetron   Data Review:   Micro Results No results found for this or any previous visit  (from the past 240 hour(s)).  Radiology Reports Nm Myocar Multi W/spect W/wall Motion / Ef  07/31/2015  Pharmacological myocardial perfusion imaging study with no significant  ischemia Normal wall motion, EF estimated at 55% No EKG changes concerning for ischemia at peak stress or in recovery. Low risk scan Signed, Esmond Plants, MD, Ph.D [facility]   Dg Chest Marty 1 View  07/31/2015  CLINICAL DATA:  70 year old male with chest pain EXAM: PORTABLE CHEST 1 VIEW COMPARISON:  Chest CT dated 03/13/2015 FINDINGS: Single-view of the chest demonstrates emphysematous changes of the lungs. There is no focal consolidation, pleural effusion, or pneumothorax. The cardiac silhouette is within normal limits. Median sternotomy wires and CABG vascular clips noted. Lower cervical spine fixation plate and screws. Partially visualized left proximal humeral orthopedic hardware. No acute fracture. IMPRESSION: No active disease. Electronically Signed   By: Anner Crete M.D.   On: 07/31/2015 00:13     CBC  Recent Labs Lab 07/30/15 2235  WBC 8.0  HGB 14.3  HCT 40.8  PLT 170  MCV 93.8  MCH 32.8  MCHC 35.0  RDW 13.4    Chemistries   Recent Labs Lab 07/31/15 0029  NA 138  K 3.5  CL 104  CO2 25  GLUCOSE 163*  BUN 16  CREATININE 1.12  CALCIUM 8.7*  AST 19  ALT 21  ALKPHOS 45  BILITOT 1.1   ------------------------------------------------------------------------------------------------------------------ estimated creatinine clearance is 73.8 mL/min (by C-G formula based on Cr of 1.12). ------------------------------------------------------------------------------------------------------------------  Recent Labs  07/31/15 0029  HGBA1C 6.6*   ------------------------------------------------------------------------------------------------------------------ No results for input(s): CHOL, HDL, LDLCALC, TRIG, CHOLHDL, LDLDIRECT in the last 72  hours. ------------------------------------------------------------------------------------------------------------------  Recent Labs  07/31/15 0029  TSH 1.497   ------------------------------------------------------------------------------------------------------------------ No results for input(s): VITAMINB12, FOLATE, FERRITIN, TIBC, IRON, RETICCTPCT in the last 72 hours.  Coagulation profile No results for input(s): INR, PROTIME in the last 168 hours.  No results for input(s): DDIMER in the last 72 hours.  Cardiac Enzymes  Recent Labs Lab 07/31/15 0029 07/31/15 0912 07/31/15 1558  TROPONINI 0.03 0.03 <0.03   ------------------------------------------------------------------------------------------------------------------ Invalid input(s): POCBNP    Assessment & Plan   This is a 70 year old Caucasian male with past medical history of CAD admitted for chest pain. 1. Chest pain: Atypical for cardiac pain  Await stress test results. Further workup based on that 2. Coronary artery disease: Continue Plavix 3. Essential hypertension: Continue Coreg, amlodipine and hydrochlorothiazide. Hydralazine when necessary elevated blood pressure 4. Diabetes mellitus type 2: Sliding scale and some for now  5. Chronic diarrhea: Continue probiotic 6. DVT prophylaxis: Heparin 7. GI prophylaxis: Continue pantoprazole per home regimen       Code Status Orders        Start     Ordered   07/31/15 0333  Full code   Continuous     07/31/15 0332    Code Status History    Date Active Date Inactive Code Status Order ID Comments User Context   03/13/2015  2:51 AM 03/16/2015  5:26 PM Full Code [id number]  Lytle Butte, MD ED           Consults  cardiology DVT Prophylaxis  heparin  Lab Results  Component Value Date   PLT 170 07/30/2015     Time Spent in minutes  45 minutes  Dustin Flock M.D on 08/01/2015 at 2:23 PM  Between 7am to 6pm - Pager - [phone]  After 6pm  go to [email or web address] - password [email or web address] [facility]   Office  [phone]

## 2016-04-28 ENCOUNTER — Other Ambulatory Visit (INDEPENDENT_AMBULATORY_CARE_PROVIDER_SITE_OTHER): Payer: Self-pay | Admitting: Vascular Surgery

## 2016-04-28 DIAGNOSIS — I6523 Occlusion and stenosis of bilateral carotid arteries: Secondary | ICD-10-CM

## 2016-05-02 ENCOUNTER — Ambulatory Visit (INDEPENDENT_AMBULATORY_CARE_PROVIDER_SITE_OTHER): Payer: Medicare Other | Admitting: Vascular Surgery

## 2016-05-02 ENCOUNTER — Ambulatory Visit (INDEPENDENT_AMBULATORY_CARE_PROVIDER_SITE_OTHER): Payer: Medicare Other

## 2016-05-02 ENCOUNTER — Encounter (INDEPENDENT_AMBULATORY_CARE_PROVIDER_SITE_OTHER): Payer: Self-pay | Admitting: Vascular Surgery

## 2016-05-02 VITALS — BP 141/84 | HR 54 | Resp 16 | Ht 72.0 in | Wt 228.0 lb

## 2016-05-02 DIAGNOSIS — E118 Type 2 diabetes mellitus with unspecified complications: Secondary | ICD-10-CM | POA: Diagnosis not present

## 2016-05-02 DIAGNOSIS — I6523 Occlusion and stenosis of bilateral carotid arteries: Secondary | ICD-10-CM

## 2016-05-02 DIAGNOSIS — E1165 Type 2 diabetes mellitus with hyperglycemia: Secondary | ICD-10-CM

## 2016-05-02 DIAGNOSIS — I10 Essential (primary) hypertension: Secondary | ICD-10-CM | POA: Insufficient documentation

## 2016-05-02 DIAGNOSIS — E785 Hyperlipidemia, unspecified: Secondary | ICD-10-CM | POA: Diagnosis not present

## 2016-05-02 DIAGNOSIS — Z794 Long term (current) use of insulin: Secondary | ICD-10-CM

## 2016-05-02 DIAGNOSIS — E1159 Type 2 diabetes mellitus with other circulatory complications: Secondary | ICD-10-CM | POA: Insufficient documentation

## 2016-05-02 DIAGNOSIS — IMO0002 Reserved for concepts with insufficient information to code with codable children: Secondary | ICD-10-CM

## 2016-05-02 DIAGNOSIS — I6529 Occlusion and stenosis of unspecified carotid artery: Secondary | ICD-10-CM | POA: Insufficient documentation

## 2016-05-02 NOTE — Assessment & Plan Note (Signed)
blood pressure control important in reducing the progression of atherosclerotic disease. On appropriate oral medications.  

## 2016-05-02 NOTE — Assessment & Plan Note (Signed)
lipid control important in reducing the progression of atherosclerotic disease. Continue statin therapy  

## 2016-05-02 NOTE — Assessment & Plan Note (Signed)
blood glucose control important in reducing the progression of atherosclerotic disease. Also, involved in wound healing. On appropriate medications.  

## 2016-05-02 NOTE — Assessment & Plan Note (Signed)
The patient's carotid duplex demonstrates widely patent carotid artery stents bilaterally without recurrent stenosis. They're doing well without any focal neurologic symptoms. They will continue appropriate medical management with aspirin and a statin agent. We will plan to see them back in 12 months with follow-up duplex. They will contact our office with any problems or focal neurologic deficits in the interim.

## 2016-05-02 NOTE — Progress Notes (Signed)
MRN : [id number]  Bryan Scott is a 70 y.o. ([birth year]) male who presents with chief complaint of  Chief Complaint  Patient presents with  . Re-evaluation    Carotid ultrasound  .  History of Present Illness: Patient returns in follow up of his carotid disease.  He is doing well and has no new complaints today.  He is several years s/p bilateral carotid artery stenting.  His duplex today demonstrates widely patent bilateral carotid artery stents without evidence of restenosis.  Current Outpatient Prescriptions  Medication Sig Dispense Refill  . acetaminophen (TYLENOL) 325 MG tablet Take 975 mg by mouth 3 (three) times daily as needed for mild pain or fever.    Marland Kitchen alum & mag hydroxide-simeth (MAALOX/MYLANTA) 200-200-20 MG/5ML suspension Take 30 mLs by mouth every 6 (six) hours as needed for indigestion or heartburn. 355 mL 0  . amLODipine (NORVASC) 5 MG tablet Take 1 tablet (5 mg total) by mouth daily. 30 tablet 0  . atorvastatin (LIPITOR) 10 MG tablet Take 1 tablet (10 mg total) by mouth daily. 30 tablet 0  . carvedilol (COREG) 12.5 MG tablet Take 3 tablets (37.5 mg total) by mouth every 12 (twelve) hours. 60 tablet 0  . clopidogrel (PLAVIX) 75 MG tablet Take 75 mg by mouth daily.    . hydrALAZINE (APRESOLINE) 25 MG tablet Take 1 tablet (25 mg total) by mouth 3 (three) times daily. (Patient taking differently: Take 25 mg by mouth 2 (two) times daily. ) 90 tablet 0  . hydrochlorothiazide (HYDRODIURIL) 25 MG tablet Take 25 mg by mouth daily.    . magnesium oxide (MAG-OX) 400 MG tablet Take 400 mg by mouth 2 (two) times daily.     . metFORMIN (GLUCOPHAGE) 1000 MG tablet Take 1,000 mg by mouth 2 (two) times daily.    Marland Kitchen morphine (MS CONTIN) 15 MG 12 hr tablet Take 15 mg by mouth every 8 (eight) hours.    . naproxen (NAPROSYN) 500 MG tablet Take 1 tablet (500 mg total) by mouth 2 (two) times daily as needed. 24 tablet 0  . ondansetron (ZOFRAN) 8 MG tablet Take 8 mg by mouth every 8 (eight)  hours as needed for nausea or vomiting.     Marland Kitchen oxyCODONE-acetaminophen (PERCOCET) 5-325 MG tablet Take 1 tablet by mouth every 8 (eight) hours as needed for severe pain. 20 tablet 0  . pantoprazole (PROTONIX) 40 MG tablet Take 1 tablet (40 mg total) by mouth daily. 30 tablet 0  . saccharomyces boulardii (FLORASTOR) 250 MG capsule Take 1 capsule (250 mg total) by mouth 2 (two) times daily. 30 capsule 0   No current facility-administered medications for this visit.     Past Medical History:  Diagnosis Date  . 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 Cancer ([place])   . Carotid artery disease (HCC)    > 75% bilateral ICA stenoses on 01/2013 CT  . Chronic pain syndrome   . Coronary artery disease    s/p CABG ~ 2007  . GERD (gastroesophageal reflux disease)   . Heparin allergy    Bleeding  . History of tobacco use   . Hyperlipidemia   . Hypertension   . Ischemic cardiomyopathy   . Paroxysmal atrial fibrillation (HCC)    On Xarelto anticoagulation  . Type 2 diabetes mellitus (HCC)    A1C 6.8% in 01/2013    Past Surgical History:  Procedure Laterality Date  . CARDIAC CATHETERIZATION    . CORONARY ARTERY  BYPASS GRAFT  2007    Social History Social History  Substance Use Topics  . Smoking status: Former Smoker    Packs/day: 0.00    Types: Cigarettes  . Smokeless tobacco: Not on file     Comment: 30 pack-year history. Quit 8-9 years ago.   . Alcohol use No     Family History Family History  Problem Relation Age of Onset  . Seizures Father 43    Deceased from complications with seizures  . Heart disease Mother 23    Deceased     Allergies  Allergen Reactions  . 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 Heparin Other (See Comments)    Reaction:  Bleeding   . Lisinopril Swelling     REVIEW OF SYSTEMS (Negative unless checked)  Constitutional: [x] Weight loss  [] Fever  [] Chills Cardiac: [] Chest pain   [] Chest pressure   [] Palpitations   [] Shortness of breath when laying  flat   [] Shortness of breath at rest   [] Shortness of breath with exertion. Vascular:  [] Pain in legs with walking   [] Pain in legs at rest   [] Pain in legs when laying flat   [] Claudication   [] Pain in feet when walking  [] Pain in feet at rest  [] Pain in feet when laying flat   [] History of DVT   [] Phlebitis   [] Swelling in legs   [] Varicose veins   [] Non-healing ulcers Pulmonary:   [] Uses home oxygen   [] Productive cough   [] Hemoptysis   [] Wheeze  [] COPD   [] Asthma Neurologic:  [] Dizziness  [] Blackouts   [] Seizures   [] History of stroke   [] History of TIA  [] Aphasia   [] Temporary blindness   [] Dysphagia   [] Weakness or numbness in arms   [] Weakness or numbness in legs Musculoskeletal:  [] Arthritis   [] Joint swelling   [] Joint pain   [] Low back pain Hematologic:  [] Easy bruising  [] Easy bleeding   [] Hypercoagulable state   [] Anemic  [] Hepatitis Gastrointestinal:  [] Blood in stool   [] Vomiting blood  [] Gastroesophageal reflux/heartburn   [x] Difficulty swallowing. Genitourinary:  [] Chronic kidney disease   [] Difficult urination  [] Frequent urination  [] Burning with urination   [] Blood in urine Skin:  [] Rashes   [] Ulcers   [] Wounds Psychological:  [] History of anxiety   []  History of major depression.  Physical Examination  Vitals:   05/02/16 1354 05/02/16 1355  BP: 140/79 (!) 141/84  Pulse: (!) 59 (!) 54  Resp: 16   Weight: 228 lb (103.4 kg)   Height: 6' (1.829 m)    Body mass index is 30.92 kg/m. Gen:  WD/WN, NAD Head: [place]/AT, No temporalis wasting. Ear/Nose/Throat: Hearing grossly intact, nares w/o erythema or drainage, trachea midline Eyes: Conjunctiva clear. Sclera non-icteric Neck: Supple.  No bruit or JVD.  Pulmonary:  Good air movement, equal and clear to auscultation bilaterally.  Cardiac: RRR, normal S1, S2, no Murmurs, rubs or gallops. Vascular:  Vessel Right Left  Radial Palpable Palpable                                   Gastrointestinal: soft,  non-tender/non-distended. No guarding/reflex.  Musculoskeletal: M/S 5/5 throughout.  No deformity or atrophy.  Neurologic: CN 2-12 intact. Sensation grossly intact in extremities.  Symmetrical.  Speech is fluent. Motor exam as listed above. Psychiatric: Judgment intact, Mood & affect appropriate for pt's clinical situation. Dermatologic: No rashes or ulcers noted.  No cellulitis or open wounds. Lymph : No  Cervical, Axillary, or Inguinal lymphadenopathy.     CBC Lab Results  Component Value Date   WBC 8.0 07/30/2015   HGB 14.3 07/30/2015   HCT 40.8 07/30/2015   MCV 93.8 07/30/2015   PLT 170 07/30/2015    BMET    Component Value Date/Time   NA 138 07/31/2015 0029   NA 131 (L) 10/25/2013 0440   K 3.5 07/31/2015 0029   K 3.9 10/25/2013 0440   CL 104 07/31/2015 0029   CL 97 (L) 10/25/2013 0440   CO2 25 07/31/2015 0029   CO2 26 10/25/2013 0440   GLUCOSE 163 (H) 07/31/2015 0029   GLUCOSE 131 (H) 10/25/2013 0440   BUN 16 07/31/2015 0029   BUN 22 (H) 10/25/2013 0440   CREATININE 1.12 07/31/2015 0029   CREATININE 1.58 (H) 10/25/2013 0440   CALCIUM 8.7 (L) 07/31/2015 0029   CALCIUM 9.3 10/25/2013 0440   GFRNONAA >60 07/31/2015 0029   GFRNONAA 44 (L) 10/25/2013 0440   GFRAA >60 07/31/2015 0029   GFRAA 51 (L) 10/25/2013 0440   CrCl cannot be calculated (Patient's most recent lab result is older than the maximum 21 days allowed.).  COAG Lab Results  Component Value Date   INR 1.0 10/23/2013   INR 1.0 10/22/2013   INR 1.2 05/03/2013    Radiology No results found.  Assessment/Plan Essential hypertension blood pressure control important in reducing the progression of atherosclerotic disease. On appropriate oral medications.   Uncontrolled type 2 diabetes mellitus with complication, with long-term current use of insulin (HCC) blood glucose control important in reducing the progression of atherosclerotic disease. Also, involved in wound healing. On appropriate  medications.   Hyperlipidemia lipid control important in reducing the progression of atherosclerotic disease. Continue statin therapy    Carotid stenosis The patient's carotid duplex demonstrates widely patent carotid artery stents bilaterally without recurrent stenosis. They're doing well without any focal neurologic symptoms. They will continue appropriate medical management with aspirin and a statin agent. We will plan to see them back in 12 months with follow-up duplex. They will contact our office with any problems or focal neurologic deficits in the interim.     Leotis Pain, MD  05/02/2016 2:18 PM    This note was created with Dragon medical transcription system.  Any errors from dictation are purely unintentional

## 2016-09-20 ENCOUNTER — Encounter: Payer: Self-pay | Admitting: Emergency Medicine

## 2016-09-20 ENCOUNTER — Inpatient Hospital Stay
Admission: EM | Admit: 2016-09-20 | Discharge: 2016-09-24 | DRG: 195 | Disposition: A | Discharge status: Home Health | Payer: Medicare Other | Attending: Internal Medicine | Admitting: Internal Medicine

## 2016-09-20 ENCOUNTER — Emergency Department: Payer: Medicare Other

## 2016-09-20 DIAGNOSIS — Z79891 Long term (current) use of opiate analgesic: Secondary | ICD-10-CM

## 2016-09-20 DIAGNOSIS — R9431 Abnormal electrocardiogram [ECG] [EKG]: Secondary | ICD-10-CM | POA: Diagnosis present

## 2016-09-20 DIAGNOSIS — Z7901 Long term (current) use of anticoagulants: Secondary | ICD-10-CM

## 2016-09-20 DIAGNOSIS — J101 Influenza due to other identified influenza virus with other respiratory manifestations: Secondary | ICD-10-CM | POA: Diagnosis not present

## 2016-09-20 DIAGNOSIS — G894 Chronic pain syndrome: Secondary | ICD-10-CM | POA: Diagnosis present

## 2016-09-20 DIAGNOSIS — Z955 Presence of coronary angioplasty implant and graft: Secondary | ICD-10-CM

## 2016-09-20 DIAGNOSIS — Z87891 Personal history of nicotine dependence: Secondary | ICD-10-CM

## 2016-09-20 DIAGNOSIS — E785 Hyperlipidemia, unspecified: Secondary | ICD-10-CM | POA: Diagnosis present

## 2016-09-20 DIAGNOSIS — R079 Chest pain, unspecified: Secondary | ICD-10-CM | POA: Diagnosis present

## 2016-09-20 DIAGNOSIS — I255 Ischemic cardiomyopathy: Secondary | ICD-10-CM | POA: Diagnosis present

## 2016-09-20 DIAGNOSIS — Z951 Presence of aortocoronary bypass graft: Secondary | ICD-10-CM

## 2016-09-20 DIAGNOSIS — Z8249 Family history of ischemic heart disease and other diseases of the circulatory system: Secondary | ICD-10-CM

## 2016-09-20 DIAGNOSIS — G8929 Other chronic pain: Secondary | ICD-10-CM | POA: Diagnosis present

## 2016-09-20 DIAGNOSIS — K219 Gastro-esophageal reflux disease without esophagitis: Secondary | ICD-10-CM | POA: Diagnosis present

## 2016-09-20 DIAGNOSIS — R0789 Other chest pain: Secondary | ICD-10-CM | POA: Diagnosis present

## 2016-09-20 DIAGNOSIS — E876 Hypokalemia: Secondary | ICD-10-CM | POA: Diagnosis present

## 2016-09-20 DIAGNOSIS — Z886 Allergy status to analgesic agent status: Secondary | ICD-10-CM

## 2016-09-20 DIAGNOSIS — D696 Thrombocytopenia, unspecified: Secondary | ICD-10-CM | POA: Diagnosis present

## 2016-09-20 DIAGNOSIS — I2510 Atherosclerotic heart disease of native coronary artery without angina pectoris: Secondary | ICD-10-CM | POA: Diagnosis present

## 2016-09-20 DIAGNOSIS — Z888 Allergy status to other drugs, medicaments and biological substances status: Secondary | ICD-10-CM

## 2016-09-20 DIAGNOSIS — E119 Type 2 diabetes mellitus without complications: Secondary | ICD-10-CM | POA: Diagnosis present

## 2016-09-20 DIAGNOSIS — I252 Old myocardial infarction: Secondary | ICD-10-CM

## 2016-09-20 DIAGNOSIS — I480 Paroxysmal atrial fibrillation: Secondary | ICD-10-CM | POA: Diagnosis present

## 2016-09-20 DIAGNOSIS — I10 Essential (primary) hypertension: Secondary | ICD-10-CM | POA: Diagnosis present

## 2016-09-20 LAB — CBC
HCT: 41.7 % (ref 40.0–52.0)
Hemoglobin: 14.7 g/dL (ref 13.0–18.0)
MCH: 33.2 pg (ref 26.0–34.0)
MCHC: 35.3 g/dL (ref 32.0–36.0)
MCV: 94 fL (ref 80.0–100.0)
PLATELETS: 139 10*3/uL — AB (ref 150–440)
RBC: 4.44 MIL/uL (ref 4.40–5.90)
RDW: 13.3 % (ref 11.5–14.5)
WBC: 7.4 10*3/uL (ref 3.8–10.6)

## 2016-09-20 LAB — BASIC METABOLIC PANEL
ANION GAP: 9 (ref 5–15)
BUN: 15 mg/dL (ref 6–20)
CALCIUM: 9.2 mg/dL (ref 8.9–10.3)
CO2: 24 mmol/L (ref 22–32)
Chloride: 98 mmol/L — ABNORMAL LOW (ref 101–111)
Creatinine, Ser: 1.38 mg/dL — ABNORMAL HIGH (ref 0.61–1.24)
GFR calc non Af Amer: 50 mL/min — ABNORMAL LOW (ref >60)
GFR, EST AFRICAN AMERICAN: 58 mL/min — AB (ref >60)
Glucose, Bld: 276 mg/dL — ABNORMAL HIGH (ref 65–99)
Potassium: 3.7 mmol/L (ref 3.5–5.1)
SODIUM: 131 mmol/L — AB (ref 135–145)

## 2016-09-20 LAB — TROPONIN I: Troponin I: <0.03 ng/mL (ref <0.03)

## 2016-09-20 IMAGING — CR DG CHEST 2V
2 series · 2 of 2 positions shown · non-contrast
Comparison: [DATE] is

CLINICAL DATA: Chest pain.

EXAM:
CHEST  2 VIEW

[chest pa]
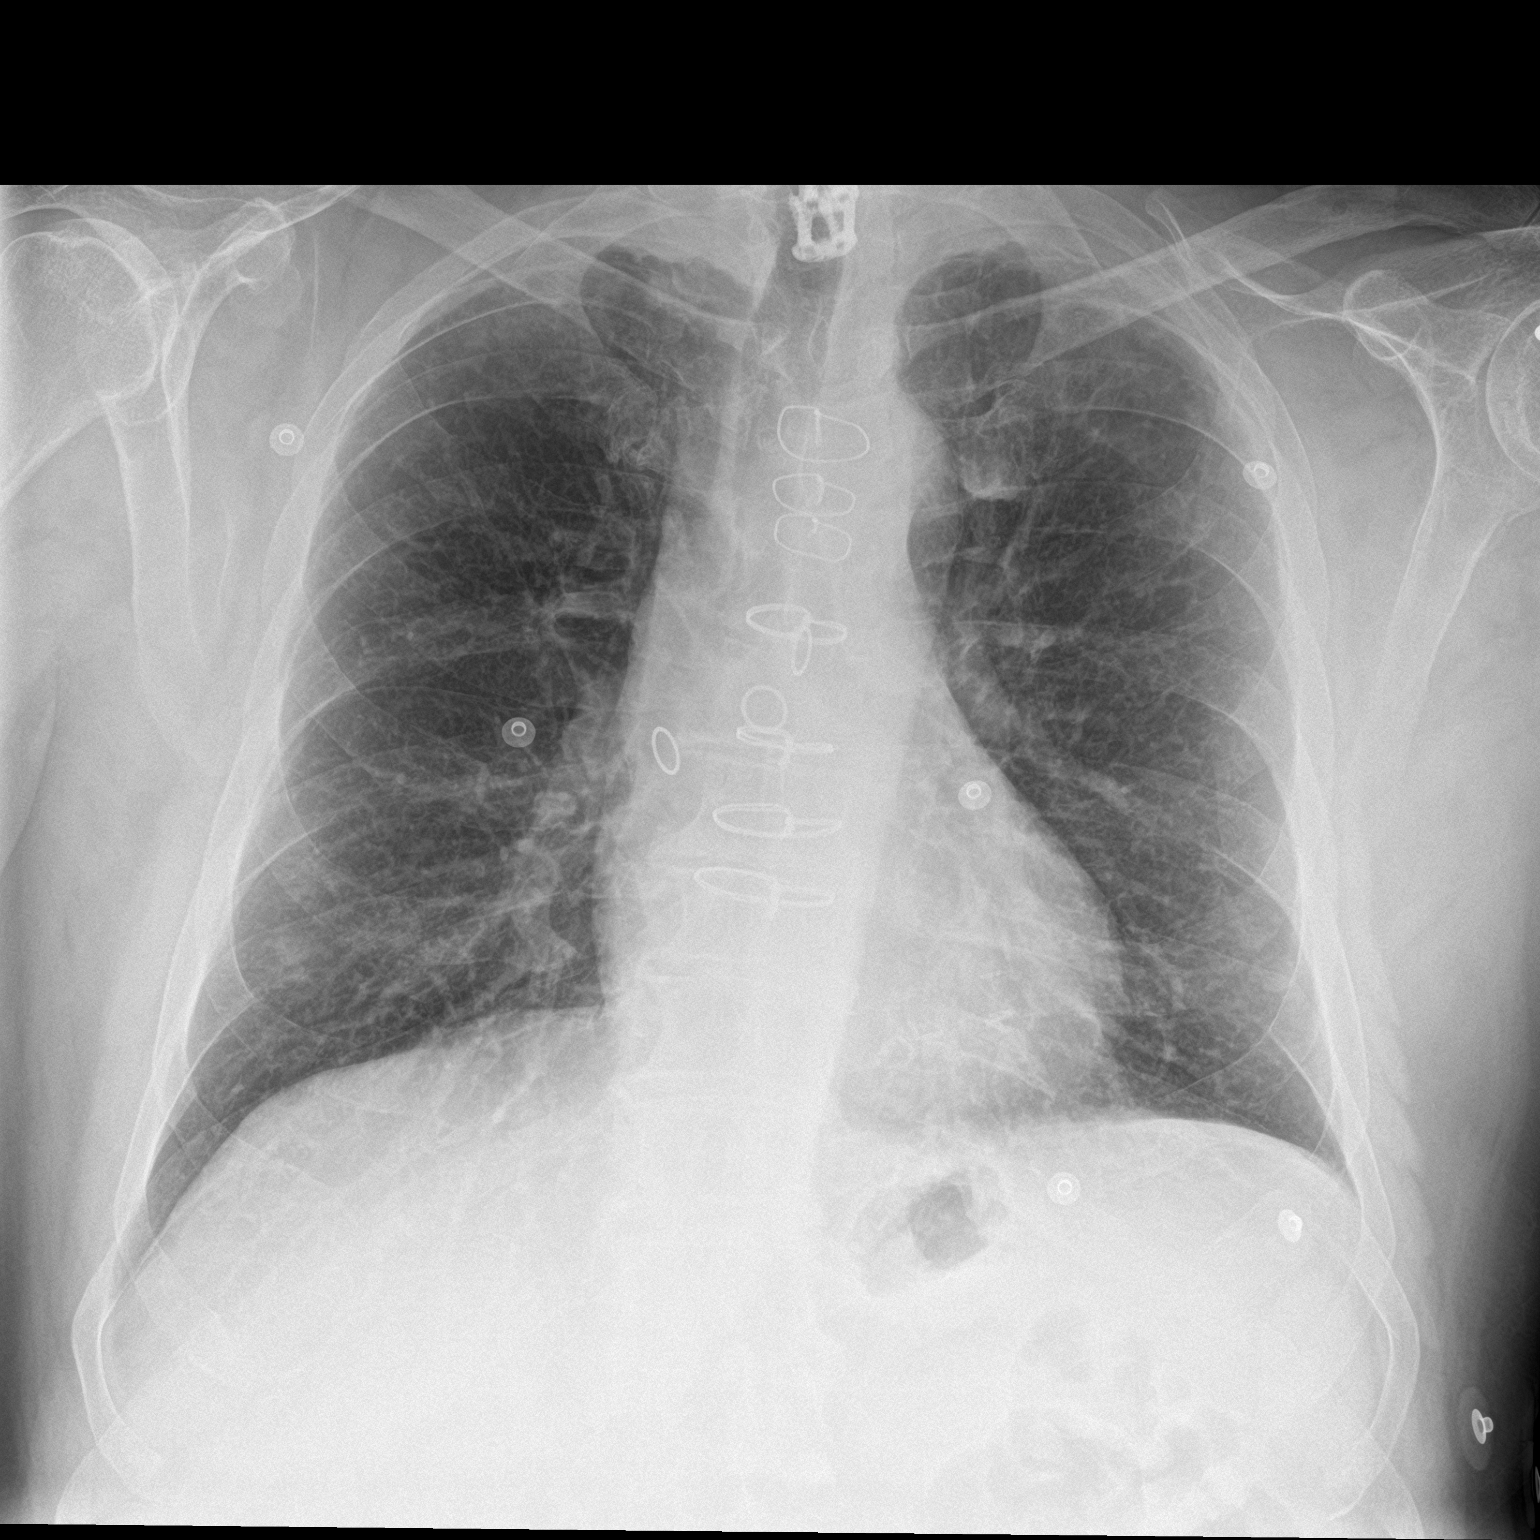

[chest lat]
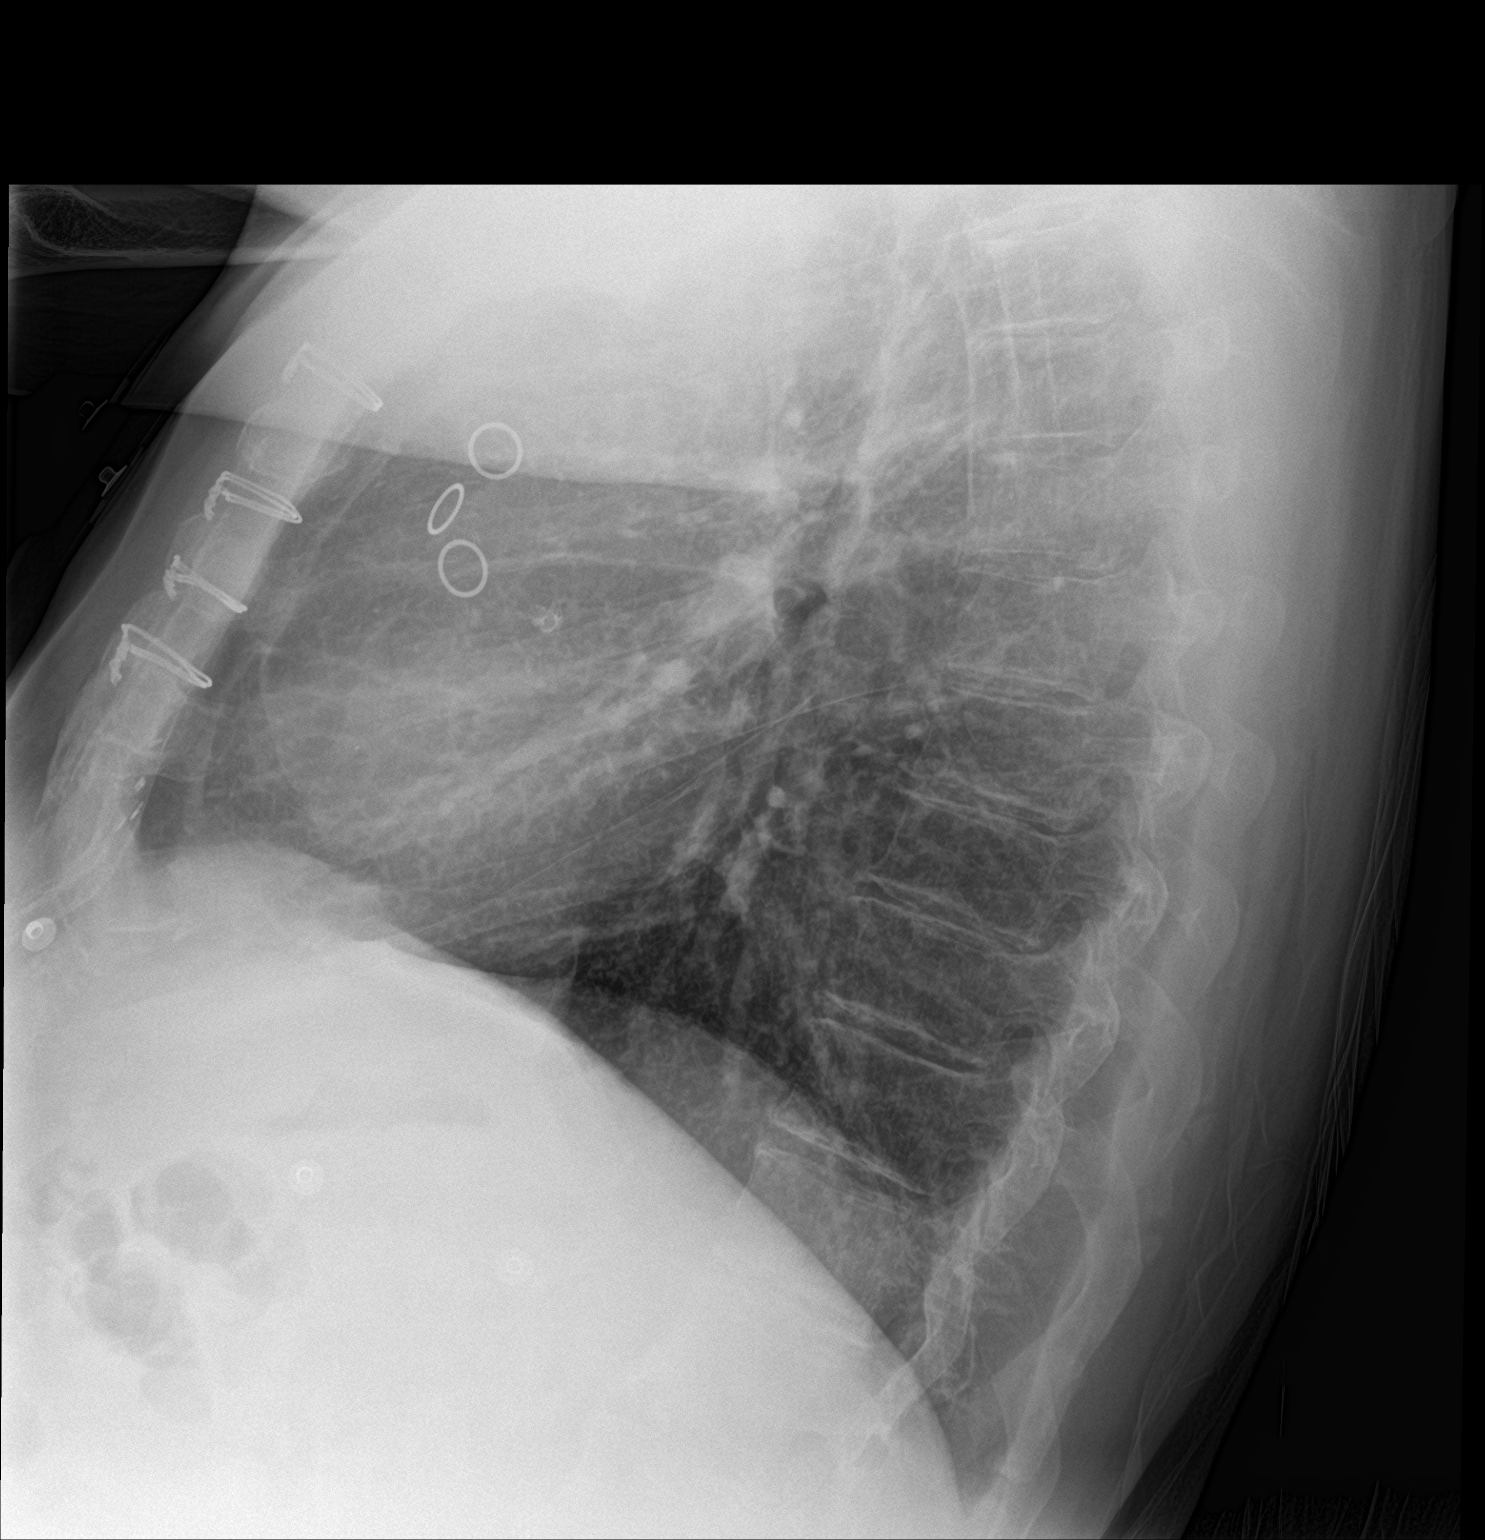

[2 of 2 positions shown; findings below may reference images not displayed]

FINDINGS: The cardiac silhouette, mediastinal and hilar contours are within
normal limits and stable. Stable surgical changes from bypass
surgery. The lungs demonstrate mild chronic bronchitic changes but
no infiltrates, edema or effusions. Stable biapical pleural and
parenchymal scarring changes. Bilateral nipple shadows are noted. No
significant bony findings.
IMPRESSION: No acute cardiopulmonary findings.

## 2016-09-20 MED ORDER — HYDROCHLOROTHIAZIDE 25 MG PO TABS
25.0000 mg | ORAL_TABLET | Freq: Every day | ORAL | Status: DC
  Administered 2016-09-21 – 2016-09-22 (×2): 25 mg via ORAL
  Filled 2016-09-20 (×2): qty 1

## 2016-09-20 MED ORDER — FENTANYL CITRATE (PF) 100 MCG/2ML IJ SOLN
100.0000 ug | Freq: Once | INTRAMUSCULAR | Status: AC
Start: 2016-09-20 — End: 2016-09-20
  Administered 2016-09-20: 100 ug via INTRAVENOUS
  Filled 2016-09-20: qty 2

## 2016-09-20 MED ORDER — ATORVASTATIN CALCIUM 10 MG PO TABS
10.0000 mg | ORAL_TABLET | Freq: Every day | ORAL | Status: DC
  Administered 2016-09-20 – 2016-09-24 (×5): 10 mg via ORAL
  Filled 2016-09-20 (×5): qty 1

## 2016-09-20 MED ORDER — AMLODIPINE BESYLATE 5 MG PO TABS
5.0000 mg | ORAL_TABLET | Freq: Every day | ORAL | Status: DC
  Administered 2016-09-21 – 2016-09-24 (×4): 5 mg via ORAL
  Filled 2016-09-20 (×4): qty 1

## 2016-09-20 MED ORDER — ACETAMINOPHEN 325 MG PO TABS: 650.0000 mg | ORAL_TABLET | Freq: Four times a day (QID) | ORAL | Status: DC | PRN

## 2016-09-20 MED ORDER — MORPHINE SULFATE ER 15 MG PO TBCR
15.0000 mg | EXTENDED_RELEASE_TABLET | Freq: Three times a day (TID) | ORAL | Status: DC | PRN
  Administered 2016-09-20 – 2016-09-22 (×4): 15 mg via ORAL
  Filled 2016-09-20 (×4): qty 1

## 2016-09-20 MED ORDER — ONDANSETRON HCL 4 MG/2ML IJ SOLN
4.0000 mg | Freq: Four times a day (QID) | INTRAMUSCULAR | Status: DC | PRN
  Administered 2016-09-20 – 2016-09-24 (×7): 4 mg via INTRAVENOUS
  Filled 2016-09-20 (×8): qty 2

## 2016-09-20 MED ORDER — ALUM & MAG HYDROXIDE-SIMETH 200-200-20 MG/5ML PO SUSP: 30.0000 mL | Freq: Four times a day (QID) | ORAL | Status: DC | PRN

## 2016-09-20 MED ORDER — FENTANYL CITRATE (PF) 100 MCG/2ML IJ SOLN
INTRAMUSCULAR | Status: AC
  Filled 2016-09-20: qty 2

## 2016-09-20 MED ORDER — NITROGLYCERIN 0.4 MG SL SUBL
0.4000 mg | SUBLINGUAL_TABLET | Freq: Once | SUBLINGUAL | Status: AC
  Administered 2016-09-20: 0.4 mg via SUBLINGUAL
  Filled 2016-09-20: qty 1

## 2016-09-20 MED ORDER — ONDANSETRON HCL 4 MG PO TABS
4.0000 mg | ORAL_TABLET | Freq: Four times a day (QID) | ORAL | Status: DC | PRN
  Administered 2016-09-21 – 2016-09-22 (×4): 4 mg via ORAL
  Filled 2016-09-20 (×4): qty 1

## 2016-09-20 MED ORDER — MORPHINE SULFATE (PF) 4 MG/ML IV SOLN
2.0000 mg | Freq: Once | INTRAVENOUS | Status: AC
  Administered 2016-09-21: 2 mg via INTRAVENOUS
  Filled 2016-09-20: qty 1

## 2016-09-20 MED ORDER — NITROGLYCERIN 0.4 MG SL SUBL: 0.4000 mg | SUBLINGUAL_TABLET | SUBLINGUAL | Status: DC | PRN

## 2016-09-20 MED ORDER — ENOXAPARIN SODIUM 40 MG/0.4ML ~~LOC~~ SOLN
40.0000 mg | SUBCUTANEOUS | Status: DC
  Administered 2016-09-20 – 2016-09-23 (×4): 40 mg via SUBCUTANEOUS
  Filled 2016-09-20 (×4): qty 0.4

## 2016-09-20 MED ORDER — ACETAMINOPHEN 650 MG RE SUPP: 650.0000 mg | Freq: Four times a day (QID) | RECTAL | Status: DC | PRN

## 2016-09-20 MED ORDER — SODIUM CHLORIDE 0.9% FLUSH
3.0000 mL | Freq: Two times a day (BID) | INTRAVENOUS | Status: DC
  Administered 2016-09-20 – 2016-09-23 (×7): 3 mL via INTRAVENOUS

## 2016-09-20 MED ORDER — CARVEDILOL 25 MG PO TABS
37.5000 mg | ORAL_TABLET | Freq: Two times a day (BID) | ORAL | Status: DC
  Administered 2016-09-20 – 2016-09-24 (×7): 37.5 mg via ORAL
  Filled 2016-09-20 (×8): qty 1

## 2016-09-20 MED ORDER — FENTANYL CITRATE (PF) 100 MCG/2ML IJ SOLN
100.0000 ug | Freq: Once | INTRAMUSCULAR | Status: AC
  Administered 2016-09-20: 100 ug via INTRAVENOUS

## 2016-09-20 NOTE — ED Triage Notes (Signed)
[facility] with reports of CPx24hrs off an on. Pt with hx of CABGx4. Ems reports elevated BP. Pt recvd 324mg  asa, one neb treatment with no relief.  Pt arrives to ed a/ox4, vss. Protocols initiated.  NAD at this time.

## 2016-09-20 NOTE — ED Notes (Signed)
Patient stated in route to 2A that he felt nausea. RN Serenity notified upon arrival to floor.

## 2016-09-20 NOTE — ED Notes (Signed)
Patient given meal tray and water. MD aware

## 2016-09-20 NOTE — H&P (Signed)
[facility] NAME: Bryan Scott    MR#:  [id number]  DATE OF BIRTH:  [birth year]  DATE OF ADMISSION:  09/20/2016  PRIMARY CARE PHYSICIAN: [name], MD   REQUESTING/REFERRING PHYSICIAN: Dr. Harvest Dark  CHIEF COMPLAINT:   Chief Complaint  Patient presents with  . Chest Pain    HISTORY OF PRESENT ILLNESS:  Bryan Scott  is a 71 y.o. male with a known history of Coronary artery disease status post bypass, chronic pain syndrome, hypertension, hyperlipidemia, paroxysmal atrial fibrillation, ischemic cardiomyopathy, history of tobacco abuse who presents to the hospital complaining of chest pain. Patient describes his chest pain as achiness in the center of his chest nonradiating associated with some nausea vomiting and shortness of breath and weakness. He therefore came to the ER for further evaluation and given his significant cardiac history hospitalist services were contacted further treatment and evaluation. Patient denies having any recent cardiac workup with stress test done. He denies any fevers chills cough or any other associated symptoms presently.  PAST MEDICAL HISTORY:   Past Medical History:  Diagnosis Date  . 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 Cancer ([place])   . Carotid artery disease (HCC)    > 75% bilateral ICA stenoses on 01/2013 CT  . Chronic pain syndrome   . Coronary artery disease    s/p CABG ~ 2007  . GERD (gastroesophageal reflux disease)   . Heparin allergy    Bleeding  . History of tobacco use   . Hyperlipidemia   . Hypertension   . Ischemic cardiomyopathy   . Paroxysmal atrial fibrillation (HCC)    On Xarelto anticoagulation  . Type 2 diabetes mellitus ([place])    A1C 6.8% in 01/2013    PAST SURGICAL HISTORY:   Past Surgical History:  Procedure Laterality Date  . CARDIAC CATHETERIZATION    . CORONARY ARTERY BYPASS GRAFT  2007    SOCIAL HISTORY:   Social History  Substance Use Topics   . Smoking status: Former Smoker    Packs/day: 1.00    Years: 30.00    Types: Cigarettes  . Smokeless tobacco: Never Used     Comment: 30 pack-year history. Quit 8-9 years ago.   . Alcohol use No    FAMILY HISTORY:   Family History  Problem Relation Age of Onset  . Heart disease Mother 8    Deceased  . CVA Mother   . Seizures Father 35    Deceased from complications with seizures    DRUG ALLERGIES:   Allergies  Allergen Reactions  . 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 Heparin Other (See Comments)    Reaction:  Bleeding   . Lisinopril Swelling    REVIEW OF SYSTEMS:   Review of Systems  Constitutional: Negative for chills, fever and weight loss.  HENT: Negative for congestion, nosebleeds and tinnitus.   Eyes: Negative for blurred vision, double vision and redness.  Respiratory: Negative for cough, hemoptysis, shortness of breath and wheezing.   Cardiovascular: Positive for chest pain. Negative for orthopnea, leg swelling and PND.  Gastrointestinal: Negative for abdominal pain, diarrhea, melena, nausea and vomiting.  Genitourinary: Negative for dysuria, hematuria and urgency.  Musculoskeletal: Negative for falls and joint pain.  Neurological: Negative for dizziness, tingling, sensory change, focal weakness, seizures, weakness and headaches.  Endo/Heme/Allergies: Negative for polydipsia. Does not bruise/bleed easily.  Psychiatric/Behavioral: Negative for depression and memory loss. The patient is not nervous/anxious.   All other  systems reviewed and are negative.   MEDICATIONS AT HOME:   Prior to Admission medications   Medication Sig Start Date End Date Taking? Authorizing Provider  acetaminophen (TYLENOL) 325 MG tablet Take 975 mg by mouth 3 (three) times daily as needed for mild pain or fever.   Yes Historical Provider, MD  alum & mag hydroxide-simeth (MAALOX/MYLANTA) 200-200-20 MG/5ML suspension Take 30 mLs by mouth every 6 (six) hours as needed for indigestion or  heartburn. 03/16/15  Yes Nicholes Mango, MD  amLODipine (NORVASC) 5 MG tablet Take 1 tablet (5 mg total) by mouth daily. 03/16/15  Yes Nicholes Mango, MD  atorvastatin (LIPITOR) 10 MG tablet Take 1 tablet (10 mg total) by mouth daily. 08/01/15  Yes Dustin Flock, MD  carvedilol (COREG) 12.5 MG tablet Take 3 tablets (37.5 mg total) by mouth every 12 (twelve) hours. 03/16/15  Yes Nicholes Mango, MD  hydrALAZINE (APRESOLINE) 25 MG tablet Take 1 tablet (25 mg total) by mouth 3 (three) times daily. Patient taking differently: Take 25 mg by mouth 2 (two) times daily.  03/16/15  Yes Nicholes Mango, MD  hydrochlorothiazide (HYDRODIURIL) 25 MG tablet Take 25 mg by mouth daily.   Yes Historical Provider, MD  magnesium oxide (MAG-OX) 400 MG tablet Take 400 mg by mouth 2 (two) times daily.    Yes Historical Provider, MD  morphine (MS CONTIN) 15 MG 12 hr tablet Take 15 mg by mouth every 8 (eight) hours.   Yes Historical Provider, MD  ondansetron (ZOFRAN) 8 MG tablet Take 8 mg by mouth every 8 (eight) hours as needed for nausea or vomiting.    Yes Historical Provider, MD  PRESCRIPTION MEDICATION Take 1 tablet by mouth daily. Blood sugar medication   Yes Historical Provider, MD  naproxen (NAPROSYN) 500 MG tablet Take 1 tablet (500 mg total) by mouth 2 (two) times daily as needed. Patient not taking: Reported on 09/20/2016 08/01/15   Dustin Flock, MD  oxyCODONE-acetaminophen (PERCOCET) 5-325 MG tablet Take 1 tablet by mouth every 8 (eight) hours as needed for severe pain. Patient not taking: Reported on 09/20/2016 08/01/15   Dustin Flock, MD  pantoprazole (PROTONIX) 40 MG tablet Take 1 tablet (40 mg total) by mouth daily. Patient not taking: Reported on 09/20/2016 03/16/15   Nicholes Mango, MD  saccharomyces boulardii (FLORASTOR) 250 MG capsule Take 1 capsule (250 mg total) by mouth 2 (two) times daily. Patient not taking: Reported on 09/20/2016 03/16/15   Nicholes Mango, MD      VITAL SIGNS:  Blood pressure 117/71, pulse 70, temperature  98 F (36.7 C), temperature source Oral, resp. rate 18, height 6' (1.829 m), weight 99.8 kg (220 lb), SpO2 96 %.  PHYSICAL EXAMINATION:  Physical Exam  GENERAL:  71 y.o.-year-old patient lying in bed in no acute distress.  EYES: Pupils equal, round, reactive to light and accommodation. No scleral icterus. Extraocular muscles intact.  HEENT: Head atraumatic, normocephalic. Oropharynx and nasopharynx clear. No oropharyngeal erythema, moist oral mucosa  NECK:  Supple, no jugular venous distention. No thyroid enlargement, no tenderness.  LUNGS: Normal breath sounds bilaterally, no wheezing, rales, rhonchi. No use of accessory muscles of respiration.  CARDIOVASCULAR: S1, S2 RRR. II/VI SEM at LSB, No rubs, gallops, clicks.  ABDOMEN: Soft, nontender, nondistended. Bowel sounds present. No organomegaly or mass.  EXTREMITIES: No pedal edema, cyanosis, or clubbing. + 2 pedal & radial pulses b/l.   NEUROLOGIC: Cranial nerves II through XII are intact. No focal Motor or sensory deficits appreciated b/l PSYCHIATRIC: The patient  is alert and oriented x 3.  SKIN: No obvious rash, lesion, or ulcer.   LABORATORY PANEL:   CBC  Recent Labs Lab 09/20/16 1318  WBC 7.4  HGB 14.7  HCT 41.7  PLT 139*   ------------------------------------------------------------------------------------------------------------------  Chemistries   Recent Labs Lab 09/20/16 1318  NA 131*  K 3.7  CL 98*  CO2 24  GLUCOSE 276*  BUN 15  CREATININE 1.38*  CALCIUM 9.2   ------------------------------------------------------------------------------------------------------------------  Cardiac Enzymes  Recent Labs Lab 09/20/16 1318  TROPONINI <0.03   ------------------------------------------------------------------------------------------------------------------  RADIOLOGY:  Dg Chest 2 View  Result Date: 09/20/2016 CLINICAL DATA:  Chest pain. EXAM: CHEST  2 VIEW COMPARISON:  03/13/2015 is FINDINGS: The  cardiac silhouette, mediastinal and hilar contours are within normal limits and stable. Stable surgical changes from bypass surgery. The lungs demonstrate mild chronic bronchitic changes but no infiltrates, edema or effusions. Stable biapical pleural and parenchymal scarring changes. Bilateral nipple shadows are noted. No significant bony findings. IMPRESSION: No acute cardiopulmonary findings. Electronically Signed   By: Marijo Sanes M.D.   On: 09/20/2016 14:20     IMPRESSION AND PLAN:   71 year old male with past medical history of coronary artery disease status post bypass, chronic pain syndrome, hypertension, hyperlipidemia, ischemic cardiomyopathy, diabetes type 2 without complication, GERD, came into the hospital due to chest pain.  1. Chest pain-patient's symptoms are quite atypical but he does have significant risk factors given history of coronary disease and bypass. -We'll observe overnight on telemetry, cycle his cardiac markers which are currently negative. EKG shows no acute ST-T wave changes. -Consult cardiology for cardiac markers turn positive. Patient does not have a cardiologist presently and follows up with the [facility]. - continue aspirin, statin, beta blocker, Nitro.  2. GERD-continue Maalox, Protonix.  3. Chronic pain syndrome-continue MS Contin.   4. Hyperlipidemia-continue atorvastatin.  5. Essential hypertension-continue carvedilol, hydralazine, amlodipine.    All the records are reviewed and case discussed with ED provider. Management plans discussed with the patient, family and they are in agreement.  CODE STATUS: Full Code  TOTAL TIME TAKING CARE OF THIS PATIENT: 40 minutes.    Henreitta Leber M.D on 09/20/2016 at 3:40 PM  Between 7am to 6pm - Pager - [phone]  After 6pm go to [email or web address] - password [email or web address] [facility]  Office  [phone]  CC: Primary care physician; [name], MD

## 2016-09-20 NOTE — Care Management Obs Status (Signed)
[facility] NOTIFICATION   Patient Details  Name: Bryan Scott MRN: [id number] Date of Birth: May 29, 1946   Medicare Observation Status Notification Given:  Yes (chest pain)    Ival Bible, RN 09/20/2016, 3:54 PM

## 2016-09-20 NOTE — ED Provider Notes (Signed)
[facility] Emergency Department Provider Note  Time seen: 1:23 PM  I have reviewed the triage vital signs and the nursing notes.   HISTORY  Chief Complaint Chest Pain    HPI Bryan Scott is a 71 y.o. male with a past medical history of gastric reflux, hypertension, hyperlipidemia, paroxysmal atrial fibrillation, history of CABG, history of coronary stents after his CABG. Last emesis approximately 6 or 7 years ago per patient. Patient states moderate central chest pain starting last night and continuing today. He went to urgent care and was referred to the emergency department. Here the patient continues to state moderate central chest pain, worse with palpation. Denies any shortness of breath nausea or diaphoresis. Patient did not take nitroglycerin. He states it is been several years since he has had chest pain this bad.  Past Medical History:  Diagnosis Date  . 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 Cancer ([place])   . Carotid artery disease (HCC)    > 75% bilateral ICA stenoses on 01/2013 CT  . Chronic pain syndrome   . Coronary artery disease    s/p CABG ~ 2007  . GERD (gastroesophageal reflux disease)   . Heparin allergy    Bleeding  . History of tobacco use   . Hyperlipidemia   . Hypertension   . Ischemic cardiomyopathy   . Paroxysmal atrial fibrillation (HCC)    On Xarelto anticoagulation  . Type 2 diabetes mellitus ([place])    A1C 6.8% in 01/2013    Patient Active Problem List   Diagnosis Date Noted  . Essential hypertension 05/02/2016  . Carotid stenosis 05/02/2016  . Chest pain 07/31/2015  . Coronary artery disease involving coronary bypass graft of native heart with angina pectoris with documented spasm ([place])   . Chest pain with moderate risk for cardiac etiology   . Uncontrolled type 2 diabetes mellitus with complication, with long-term current use of insulin ([place])   . Hyperlipidemia   . Pain of left upper extremity   . Angina pectoris  ([place])   . Abdominal pain   . Chest pain, central 03/13/2015  . Hypertensive urgency 03/13/2015    Past Surgical History:  Procedure Laterality Date  . CARDIAC CATHETERIZATION    . CORONARY ARTERY BYPASS GRAFT  2007    Prior to Admission medications   Medication Sig Start Date End Date Taking? Authorizing Provider  acetaminophen (TYLENOL) 325 MG tablet Take 975 mg by mouth 3 (three) times daily as needed for mild pain or fever.    Historical Provider, MD  alum & mag hydroxide-simeth (MAALOX/MYLANTA) 200-200-20 MG/5ML suspension Take 30 mLs by mouth every 6 (six) hours as needed for indigestion or heartburn. 03/16/15   Nicholes Mango, MD  amLODipine (NORVASC) 5 MG tablet Take 1 tablet (5 mg total) by mouth daily. 03/16/15   Nicholes Mango, MD  atorvastatin (LIPITOR) 10 MG tablet Take 1 tablet (10 mg total) by mouth daily. 08/01/15   Dustin Flock, MD  carvedilol (COREG) 12.5 MG tablet Take 3 tablets (37.5 mg total) by mouth every 12 (twelve) hours. 03/16/15   Nicholes Mango, MD  clopidogrel (PLAVIX) 75 MG tablet Take 75 mg by mouth daily.    Historical Provider, MD  hydrALAZINE (APRESOLINE) 25 MG tablet Take 1 tablet (25 mg total) by mouth 3 (three) times daily. Patient taking differently: Take 25 mg by mouth 2 (two) times daily.  03/16/15   Nicholes Mango, MD  hydrochlorothiazide (HYDRODIURIL) 25 MG tablet Take 25  mg by mouth daily.    Historical Provider, MD  magnesium oxide (MAG-OX) 400 MG tablet Take 400 mg by mouth 2 (two) times daily.     Historical Provider, MD  metFORMIN (GLUCOPHAGE) 1000 MG tablet Take 1,000 mg by mouth 2 (two) times daily.    Historical Provider, MD  morphine (MS CONTIN) 15 MG 12 hr tablet Take 15 mg by mouth every 8 (eight) hours.    Historical Provider, MD  naproxen (NAPROSYN) 500 MG tablet Take 1 tablet (500 mg total) by mouth 2 (two) times daily as needed. 08/01/15   Dustin Flock, MD  ondansetron (ZOFRAN) 8 MG tablet Take 8 mg by mouth every 8 (eight) hours as needed for  nausea or vomiting.     Historical Provider, MD  oxyCODONE-acetaminophen (PERCOCET) 5-325 MG tablet Take 1 tablet by mouth every 8 (eight) hours as needed for severe pain. 08/01/15   Dustin Flock, MD  pantoprazole (PROTONIX) 40 MG tablet Take 1 tablet (40 mg total) by mouth daily. 03/16/15   Nicholes Mango, MD  saccharomyces boulardii (FLORASTOR) 250 MG capsule Take 1 capsule (250 mg total) by mouth 2 (two) times daily. 03/16/15   Nicholes Mango, MD    Allergies  Allergen Reactions  . 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 Heparin Other (See Comments)    Reaction:  Bleeding   . Lisinopril Swelling    Family History  Problem Relation Age of Onset  . Heart disease Mother 24    Deceased  . Seizures Father 9    Deceased from complications with seizures    Social History Social History  Substance Use Topics  . Smoking status: Former Smoker    Packs/day: 0.00    Types: Cigarettes  . Smokeless tobacco: Never Used     Comment: 30 pack-year history. Quit 8-9 years ago.   . Alcohol use No    Review of Systems Constitutional: Negative for fever. Cardiovascular: Positive for chest pain since last night Respiratory: Negative for shortness of breath. Gastrointestinal: Negative for abdominal pain Neurological: Negative for headache 10-point ROS otherwise negative.  ____________________________________________   PHYSICAL EXAM:  VITAL SIGNS: ED Triage Vitals  Enc Vitals Group     BP 09/20/16 1309 (!) 129/100     Pulse Rate 09/20/16 1309 71     Resp 09/20/16 1309 19     Temp 09/20/16 1309 98 F (36.7 C)     Temp Source 09/20/16 1309 Oral     SpO2 09/20/16 1309 98 %     Weight 09/20/16 1310 220 lb (99.8 kg)     Height 09/20/16 1310 6' (1.829 m)     Head Circumference --      Peak Flow --      Pain Score 09/20/16 1309 9     Pain Loc --      Pain Edu? --      Excl. in [place]? --     Constitutional: Alert and oriented. Well appearing and in no distress. Eyes: Normal exam ENT    Head: Normocephalic and atraumatic   Mouth/Throat: Mucous membranes are moist. Cardiovascular: Normal rate, regular rhythm. No murmur Respiratory: Normal respiratory effort without tachypnea nor retractions. Breath sounds are clear. Moderate central chest tenderness to palpation. Gastrointestinal: Soft and nontender. No distention.   Musculoskeletal: Nontender with normal range of motion in all extremities.  Neurologic:  Normal speech and language. No gross focal neurologic deficits Skin:  Skin is warm, dry and intact.  Psychiatric: Mood and affect are normal.  ____________________________________________    EKG  EKG reviewed and interpreted by myself shows normal sinus rhythm at 75 bpm, narrow QRS, normal axis, normal intervals and nonspecific ST changes present. No ST elevation.  ____________________________________________    RADIOLOGY  X-rays negative  ____________________________________________   INITIAL IMPRESSION / ASSESSMENT AND PLAN / ED COURSE  Pertinent labs & imaging results that were available during my care of the patient were reviewed by me and considered in my medical decision making (see chart for details).  Patient presents to the emergency department with chest pain since last night. Patient has a significant cardiac history including CABG and several stents since his CABG the last which was 6 years ago per patient. We will treat with nitroglycerin. Patient received aspirin at urgent care prior to ER arrival. Currently the patient appears well, no distress. The chest pain is somewhat reproducible. We'll obtain a chest x-ray and labs and closely monitor in the emergency department.  Patient's workup is largely normal. Troponin is negative. Chest x-ray negative. EKG shows nonspecific findings. Patient continues to have chest pain despite nitroglycerin in the emergency department. Given his significant history with significant comorbidities and continued chest  pain we will admit to the hospital for further treatment and workup. We will dose vaginal for pain control in the emergency department given a somewhat lower blood pressure 111/64 currently.  ____________________________________________   FINAL CLINICAL IMPRESSION(S) / ED DIAGNOSES  Chest pain    Harvest Dark, MD 09/20/16 1432

## 2016-09-20 NOTE — ED Notes (Signed)
Patient sleeping

## 2016-09-21 ENCOUNTER — Observation Stay (HOSPITAL_BASED_OUTPATIENT_CLINIC_OR_DEPARTMENT_OTHER)
Admit: 2016-09-21 | Discharge: 2016-09-21 | Disposition: A | Discharge status: Home / Self Care | Payer: Medicare Other | Attending: Physician Assistant | Admitting: Physician Assistant

## 2016-09-21 DIAGNOSIS — R9431 Abnormal electrocardiogram [ECG] [EKG]: Secondary | ICD-10-CM | POA: Diagnosis present

## 2016-09-21 DIAGNOSIS — R079 Chest pain, unspecified: Secondary | ICD-10-CM | POA: Diagnosis not present

## 2016-09-21 DIAGNOSIS — R0789 Other chest pain: Secondary | ICD-10-CM | POA: Diagnosis not present

## 2016-09-21 DIAGNOSIS — I480 Paroxysmal atrial fibrillation: Secondary | ICD-10-CM | POA: Diagnosis not present

## 2016-09-21 DIAGNOSIS — E876 Hypokalemia: Secondary | ICD-10-CM | POA: Diagnosis not present

## 2016-09-21 DIAGNOSIS — G8929 Other chronic pain: Secondary | ICD-10-CM | POA: Diagnosis present

## 2016-09-21 DIAGNOSIS — I160 Hypertensive urgency: Secondary | ICD-10-CM

## 2016-09-21 DIAGNOSIS — Z951 Presence of aortocoronary bypass graft: Secondary | ICD-10-CM

## 2016-09-21 LAB — ECHOCARDIOGRAM COMPLETE
Height: 72 in
WEIGHTICAEL: 3520 [oz_av]

## 2016-09-21 LAB — MAGNESIUM: Magnesium: 1.6 mg/dL — ABNORMAL LOW (ref 1.7–2.4)

## 2016-09-21 LAB — BASIC METABOLIC PANEL
ANION GAP: 13 (ref 5–15)
BUN: 17 mg/dL (ref 6–20)
CO2: 22 mmol/L (ref 22–32)
CREATININE: 1.34 mg/dL — AB (ref 0.61–1.24)
Calcium: 9 mg/dL (ref 8.9–10.3)
Chloride: 95 mmol/L — ABNORMAL LOW (ref 101–111)
GFR calc non Af Amer: 52 mL/min — ABNORMAL LOW (ref >60)
GFR, EST AFRICAN AMERICAN: 60 mL/min — AB (ref >60)
GLUCOSE: 302 mg/dL — AB (ref 65–99)
Potassium: 3.4 mmol/L — ABNORMAL LOW (ref 3.5–5.1)
Sodium: 130 mmol/L — ABNORMAL LOW (ref 135–145)

## 2016-09-21 LAB — TROPONIN I: Troponin I: <0.03 ng/mL (ref <0.03)

## 2016-09-21 LAB — CBC
HCT: 41.8 % (ref 40.0–52.0)
HEMOGLOBIN: 14.7 g/dL (ref 13.0–18.0)
MCH: 32.7 pg (ref 26.0–34.0)
MCHC: 35.2 g/dL (ref 32.0–36.0)
MCV: 92.9 fL (ref 80.0–100.0)
Platelets: 137 10*3/uL — ABNORMAL LOW (ref 150–440)
RBC: 4.5 MIL/uL (ref 4.40–5.90)
RDW: 13.2 % (ref 11.5–14.5)
WBC: 6.9 10*3/uL (ref 3.8–10.6)

## 2016-09-21 LAB — SEDIMENTATION RATE: Sed Rate: 34 mm/hr — ABNORMAL HIGH (ref 0–20)

## 2016-09-21 MED ORDER — POTASSIUM CHLORIDE 20 MEQ PO PACK
40.0000 meq | PACK | Freq: Once | ORAL | Status: AC
Start: 2016-09-21 — End: 2016-09-21
  Administered 2016-09-21: 40 meq via ORAL
  Filled 2016-09-21: qty 2

## 2016-09-21 MED ORDER — MAGNESIUM OXIDE 400 (241.3 MG) MG PO TABS
400.0000 mg | ORAL_TABLET | Freq: Two times a day (BID) | ORAL | Status: DC
  Administered 2016-09-21 – 2016-09-24 (×7): 400 mg via ORAL
  Filled 2016-09-21 (×7): qty 1

## 2016-09-21 MED ORDER — RIVAROXABAN (XARELTO) EDUCATION KIT FOR AFIB PATIENTS
PACK | Freq: Once | Status: AC
  Administered 2016-09-21: 14:00:00
  Filled 2016-09-21: qty 1

## 2016-09-21 MED ORDER — KETOROLAC TROMETHAMINE 30 MG/ML IJ SOLN
30.0000 mg | Freq: Four times a day (QID) | INTRAMUSCULAR | Status: DC | PRN
  Administered 2016-09-21 – 2016-09-24 (×11): 30 mg via INTRAVENOUS
  Filled 2016-09-21 (×11): qty 1

## 2016-09-21 MED ORDER — OXYCODONE-ACETAMINOPHEN 5-325 MG PO TABS: 1.0000 | ORAL_TABLET | Freq: Four times a day (QID) | ORAL | Status: DC | PRN

## 2016-09-21 MED ORDER — SODIUM CHLORIDE 0.9 % IV SOLN
INTRAVENOUS | Status: DC
  Administered 2016-09-21 – 2016-09-23 (×5): via INTRAVENOUS

## 2016-09-21 MED ORDER — PERFLUTREN LIPID MICROSPHERE
1.0000 mL | INTRAVENOUS | Status: AC | PRN
  Administered 2016-09-21: 6 mL via INTRAVENOUS
  Filled 2016-09-21: qty 10

## 2016-09-21 NOTE — Plan of Care (Signed)
Problem: Pain Managment: Goal: General experience of comfort will improve Outcome: Not Progressing Patient experiences episodes of nausea and also c/o pain. Ondansterone, MS Contin, and Toradol ordered and given. Will continue to monitor and assess.   Problem: Activity: Goal: Risk for activity intolerance will decrease Outcome: Not Progressing Patient states he feels weak. Will encourage patient to increase activity when tolerable.   Problem: Activity: Goal: Ability to tolerate increased activity will improve Outcome: Not Progressing Patient still c/o chest pain and back pain. Patient states he feels very weak.

## 2016-09-21 NOTE — Progress Notes (Signed)
PT Cancellation Note  Patient Details Name: Bryan Scott MRN: [id number] DOB: 1945/10/28   Cancelled Treatment:    Reason Eval/Treat Not Completed: Patient declined, no reason specified.  Agreed to therapy in the AM but states he just doesn't feel well enough to tolerate for now.   Ramond Dial 09/21/2016, 2:13 PM   Mee Hives, PT MS Acute Rehab Dept. Number: [place] [facility] [place]

## 2016-09-21 NOTE — Consult Note (Signed)
Cardiology Consultation Note  Patient ID: Bryan Scott, MRN: [id number], DOB/AGE: Jul 20, 1945 71 y.o. Admit date: 09/20/2016   Date of Consult: 09/21/2016 Primary Physician: [name], MD Primary Cardiologist: Dr. Rockey Situ, MD (never seen as outpatient) Requesting Physician: Dr. Posey Pronto, MD  Chief Complaint: Pain in chest/lower back/legs Reason for Consult: Chest pain/abnormal EKG  HPI: Bryan Scott is a 71 y.o. male who is being seen today for the evaluation of chest pain at the request of Dr. Posey Pronto, MD. Patient has a h/o CAD s/p 4 vessel CABG in 2006 at [facility] previously on Plavix (ASA allergy, now not on any antiplatelet for unknown time), PAF dating back to at least 2014 previously on Xarelto (patient uncertain when he stopped), ischemic cardiomyopathy, multiple prior admissions for STEMI/NSTEMI/unstable angina/chronic chest pain, intolerance to heparin, carotid artery disease s/p left-sided stenting in 2014, bladder CA s/p surgical resection, DM2, history of tobacco abuse, HTN, HLD, angioedema to ACEi, and chronic pain who presented to [facility] on 09/20/16 with chest pain, low back pain, bilateral lower extremity pain  Prior cardiac cath by Dr. Clayborn Bigness in 03/2005 showed multivessel CAD. He underwent cardiac bypass surgery in 03/2015 with a LIMA-LAD, SVG-OM1, and SVG-ramus. He subsequently presented to [facility] one month later in 04/2015 with unstable angina and was found to have significant stenosis of the SVG-ramus and was transferred to [facility] for stenting to the ramus. Repeat admission in 2009 for unstable angina with negative nuclear stress test. Admission 05/2009 for NSTEMI and dysphagia. Cardaic cath showed patent LIMA-LAD, SVG-OM1, SVG-distal RCA, nonobstructive 60% distal PDA, EF 55%. He did receive heparin without issues.  Admitted to [place] in 12/2011 with atypical chest pain subsequently developing inferior ST elevation MI while eating. Emergent cardiac catheterization at that time showed left main stent  20% stenosed, LAD 10-20% stenosis, large D1 20-30% stenosed, left circumflex with ostial 90% stenosis, RCA proximal 60% stenosis, mid RCA 100% occluded, LIMA to the LAD known atretic, SVG to PDA patent-distal PDA 60-70% stenosed, PL1 30% stenosed, SVG to OM1 patent-40% post insertion, backfills left circumflex. Medical management advised. Cardiac cath showed no changes from cath to 2011 with patent SVG to OM, SVG to RCA. Admitted to [facility] 06/2012 with chest pain found to have new onset atrial fibrillation at that time in the setting of community-acquired pneumonia. D-dimer was noted to be elevated, VQ scan moderate to high risk. This was felt to be secondary to his pneumonia though patient was started on Xarelto daily secondary to A. fib. Admitted 02/2013 with atypical chest pain felt to be GI related. He underwent cardiac catheterization by Dr. Towanda Malkin at that time that was unchanged from cardiac catheterization in 2011, showing patent grafts. Admitted 10/2013 for chest pain in the setting of recurrent pneumonia and demand ischemia. He was medically managed. Admitted 02/2015 for chest pain in the setting of hypertensive urgency. Patient refused nuclear stress testing at that time. Cardiac enzymes were negative. Medical management was continued. Most recently admitted 07/2015 or chest pain. Cardiac enzymes negative. Echo essentially normal study. Underwent Lexiscan Myoview that was low risk. At that time patient was continued on Plavix (see above allergy to ASA), his Xarelto was apparently not continued at discharge.  Patient presented to [facility] on 09/20/2016 with development of generalized weakness, substernal chest pain, low back pain, and bilateral lower extremity pain. Patient reports these symptoms feel exactly like all of his other chest pain admissions. Prior to the onset of his symptoms 2 days prior he did not have any  exertional symptoms. There has been some associated nausea with one episode of vomiting.  Otherwise, no associated shortness of breath, palpitations, diaphoresis, dizziness, presyncope, or syncope. Chest pain is worse with positional movement and is reproduced when he pushes on his chest. Patient is uncertain when he stopped taking Plavix and Xarelto. He is certain that he is not taking aspirin. He has stable 3 pillow orthopnea. He denies any lower extremity swelling, or early satiety.  Upon the patient's arrival to [facility] they were found to have BP 129/100, HR 71 bpm, temp 80F, oxygen saturation 98 % on room air, weight 220 pounds. EKG showed sinus rhythm, nonspecific inferolateral ST-T changes, T-wave inversion in leads I, aVL, V3-V4, CXR showed no acute cardiopulmonary process. Labs showed negative troponin 4, unremarkable CBC outside of mild thrombocytopenia at 137, serum creatinine 1.38, potassium 3.7 trending to 3.4, sodium 131. Patient received fentanyl 100 mg 2, MS Contin 2, IV morphine 1, Toradol and continues to complain of on changed diffuse pain described in detail as above.  Past Medical History:  Diagnosis Date  . 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 Cancer ([place])   . Carotid artery disease (HCC)    > 75% bilateral ICA stenoses on 01/2013 CT  . Chronic pain syndrome   . Coronary artery disease    s/p CABG ~ 2007  . GERD (gastroesophageal reflux disease)   . Heparin allergy    Bleeding  . History of tobacco use   . Hyperlipidemia   . Hypertension   . Ischemic cardiomyopathy   . Paroxysmal atrial fibrillation (HCC)    On Xarelto anticoagulation  . Type 2 diabetes mellitus ([place])    A1C 6.8% in 01/2013      Most Recent Cardiac Studies: Myoview 07/31/2015: Study Result   Pharmacological myocardial perfusion imaging study with no significant  ischemia Normal wall motion, EF estimated at 55% No EKG changes concerning for ischemia at peak stress or in recovery. Low risk scan   TTE 07/31/2015: Study Conclusions  - Procedure narrative: Transthoracic  echocardiography. The study   was technically difficult. - Left ventricle: The cavity size was normal. There was mild   concentric hypertrophy. Systolic function was normal. The   estimated ejection fraction was in the range of 55% to 60%. Wall   motion was normal; there were no regional wall motion   abnormalities. Left ventricular diastolic function parameters   were normal. - Right ventricle: Systolic function was normal. - Pulmonary arteries: Systolic pressure was within the normal   range.  Impressions:  - Normal study.   Surgical History:  Past Surgical History:  Procedure Laterality Date  . CARDIAC CATHETERIZATION    . CORONARY ARTERY BYPASS GRAFT  2007     Home Meds: Prior to Admission medications   Medication Sig Start Date End Date Taking? Authorizing Provider  acetaminophen (TYLENOL) 325 MG tablet Take 975 mg by mouth 3 (three) times daily as needed for mild pain or fever.   Yes Historical Provider, MD  alum & mag hydroxide-simeth (MAALOX/MYLANTA) 200-200-20 MG/5ML suspension Take 30 mLs by mouth every 6 (six) hours as needed for indigestion or heartburn. 03/16/15  Yes Nicholes Mango, MD  amLODipine (NORVASC) 5 MG tablet Take 1 tablet (5 mg total) by mouth daily. 03/16/15  Yes Nicholes Mango, MD  atorvastatin (LIPITOR) 10 MG tablet Take 1 tablet (10 mg total) by mouth daily. 08/01/15  Yes Dustin Flock, MD  carvedilol (COREG) 12.5 MG tablet Take 3 tablets (37.5  mg total) by mouth every 12 (twelve) hours. 03/16/15  Yes Nicholes Mango, MD  hydrALAZINE (APRESOLINE) 25 MG tablet Take 1 tablet (25 mg total) by mouth 3 (three) times daily. Patient taking differently: Take 25 mg by mouth 2 (two) times daily.  03/16/15  Yes Nicholes Mango, MD  hydrochlorothiazide (HYDRODIURIL) 25 MG tablet Take 25 mg by mouth daily.   Yes Historical Provider, MD  magnesium oxide (MAG-OX) 400 MG tablet Take 400 mg by mouth 2 (two) times daily.    Yes Historical Provider, MD  morphine (MS CONTIN) 15 MG 12 hr  tablet Take 15 mg by mouth every 8 (eight) hours.   Yes Historical Provider, MD  ondansetron (ZOFRAN) 8 MG tablet Take 8 mg by mouth every 8 (eight) hours as needed for nausea or vomiting.    Yes Historical Provider, MD  PRESCRIPTION MEDICATION Take 1 tablet by mouth daily. Blood sugar medication   Yes Historical Provider, MD  naproxen (NAPROSYN) 500 MG tablet Take 1 tablet (500 mg total) by mouth 2 (two) times daily as needed. Patient not taking: Reported on 09/20/2016 08/01/15   Dustin Flock, MD  oxyCODONE-acetaminophen (PERCOCET) 5-325 MG tablet Take 1 tablet by mouth every 8 (eight) hours as needed for severe pain. Patient not taking: Reported on 09/20/2016 08/01/15   Dustin Flock, MD  pantoprazole (PROTONIX) 40 MG tablet Take 1 tablet (40 mg total) by mouth daily. Patient not taking: Reported on 09/20/2016 03/16/15   Nicholes Mango, MD  saccharomyces boulardii (FLORASTOR) 250 MG capsule Take 1 capsule (250 mg total) by mouth 2 (two) times daily. Patient not taking: Reported on 09/20/2016 03/16/15   Nicholes Mango, MD    Inpatient Medications:  . amLODipine  5 mg Oral Daily  . atorvastatin  10 mg Oral Daily  . carvedilol  37.5 mg Oral Q12H  . enoxaparin (LOVENOX) injection  40 mg Subcutaneous Q24H  . hydrochlorothiazide  25 mg Oral Daily  . potassium chloride  40 mEq Oral Once  . sodium chloride flush  3 mL Intravenous Q12H   . sodium chloride      Allergies:  Allergies  Allergen Reactions  . 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 Heparin Other (See Comments)    Reaction:  Bleeding   . Lisinopril Swelling    Social History   Social History  . Marital status: Married    Spouse name: N/A  . Number of children: N/A  . Years of education: N/A   Occupational History  . Not on file.   Social History Main Topics  . Smoking status: Former Smoker    Packs/day: 1.00    Years: 30.00    Types: Cigarettes  . Smokeless tobacco: Never Used     Comment: 30 pack-year history. Quit 8-9 years ago.     . Alcohol use No  . Drug use: No  . Sexual activity: Not on file   Other Topics Concern  . Not on file   Social History Narrative  . No narrative on file     Family History  Problem Relation Age of Onset  . Heart disease Mother 52    Deceased  . CVA Mother   . Seizures Father 29    Deceased from complications with seizures     Review of Systems: Review of Systems  Constitutional: Positive for malaise/fatigue. Negative for chills, diaphoresis, fever and weight loss.  HENT: Negative for congestion.   Eyes: Negative for discharge and redness.  Respiratory: Positive for cough. Negative for hemoptysis,  sputum production, shortness of breath and wheezing.   Cardiovascular: Positive for chest pain and orthopnea. Negative for palpitations, claudication, leg swelling and PND.  Gastrointestinal: Positive for nausea and vomiting. Negative for abdominal pain, blood in stool, heartburn and melena.  Genitourinary: Negative for hematuria.  Musculoskeletal: Positive for back pain, joint pain, myalgias and neck pain. Negative for falls.  Skin: Negative for rash.  Neurological: Positive for weakness. Negative for dizziness, tingling, tremors, sensory change, speech change, focal weakness, loss of consciousness and headaches.  Endo/Heme/Allergies: Does not bruise/bleed easily.  Psychiatric/Behavioral: Negative for substance abuse. The patient is not nervous/anxious.   All other systems reviewed and are negative.   Labs:  Recent Labs  09/20/16 1318 09/20/16 1803 09/20/16 2141 09/21/16 0138  TROPONINI <0.03 <0.03 <0.03 <0.03   Lab Results  Component Value Date   WBC 6.9 09/21/2016   HGB 14.7 09/21/2016   HCT 41.8 09/21/2016   MCV 92.9 09/21/2016   PLT 137 (L) 09/21/2016     Recent Labs Lab 09/21/16 0138  NA 130*  K 3.4*  CL 95*  CO2 22  BUN 17  CREATININE 1.34*  CALCIUM 9.0  GLUCOSE 302*   Lab Results  Component Value Date   CHOL 141 03/14/2015   HDL 36 (L)  03/14/2015   LDLCALC 63 03/14/2015   TRIG 211 (H) 03/14/2015   No results found for: DDIMER  Radiology/Studies:  Dg Chest 2 View  Result Date: 09/20/2016 CLINICAL DATA:  Chest pain. EXAM: CHEST  2 VIEW COMPARISON:  03/13/2015 is FINDINGS: The cardiac silhouette, mediastinal and hilar contours are within normal limits and stable. Stable surgical changes from bypass surgery. The lungs demonstrate mild chronic bronchitic changes but no infiltrates, edema or effusions. Stable biapical pleural and parenchymal scarring changes. Bilateral nipple shadows are noted. No significant bony findings. IMPRESSION: No acute cardiopulmonary findings. Electronically Signed   By: Marijo Sanes M.D.   On: 09/20/2016 14:20    EKG: Interpreted by me showed: NSR, 75 bpm, occasional PVCs, prior inferior infarct, nonspecific st/t changes inferolateral leads, TWI I, aVL, V3-V4 Telemetry: Interpreted by me showed: NSR, 70s bpm, frequent PACs, PVCs  Weights: Filed Weights   09/20/16 1310  Weight: 220 lb (99.8 kg)     Physical Exam: Blood pressure (!) 151/73, pulse 70, temperature 98.6 F (37 C), temperature source Oral, resp. rate 18, height 6' (1.829 m), weight 220 lb (99.8 kg), SpO2 93 %. Body mass index is 29.84 kg/m. General: Well developed, well nourished, in no acute distress. Head: Normocephalic, atraumatic, sclera non-icteric, no xanthomas, nares are without discharge.  Neck: Negative for carotid bruits. JVD not elevated. Lungs: Clear bilaterally to auscultation without wheezes, rales, or rhonchi. Breathing is unlabored. Heart: RRR with S1 S2. No murmurs, rubs, or gallops appreciated. Palpation of anterior chest wall along the sternum fully reproduces the patient's chest pain. Movement in his bed (rotating from right side to supine fully reproduces the patient's chest pain. Abdomen: Soft, non-tender, non-distended with normoactive bowel sounds. No hepatomegaly. No rebound/guarding. No obvious abdominal  masses. Msk:  Strength and tone appear normal for age. Extremities: No clubbing or cyanosis. No edema. Distal pedal pulses are 2+ and equal bilaterally. Neuro: Alert and oriented X 3. No facial asymmetry. No focal deficit. Moves all extremities spontaneously. Psych:  Responds to questions appropriately with a normal affect.    Assessment and Plan:  Principal Problem:   Atypical chest pain Active Problems:   CAD in native artery   Abnormal EKG  Chronic pain   Status post coronary artery bypass grafting   PAF (paroxysmal atrial fibrillation) ([place])    1. Chest pain  -Patient symptoms are overall atypical for ischemia  WOrse with turning in bed or taking deep breath    EKG without acute changes    -Patient has had numerous hospital admissions for similar symptoms described this admission with most recent cardiac catheterization in 2014 demonstrating stable coronary anatomy with compared to cardiac catheterization in 2011 -He most recently underwent ischemic evaluation via [place] in 07/2015 that was low risk with correlating echocardiogram demonstrating a normal study -He has received numerous pain medications though continues to complain of diffuse pain including substernal chest pain, low back pain, bilateral leg pain, and generalized weakness  2  CAD  Pt with numerous caths  Last in 2014 -It is unclear exactly how long he has been off antiplatelet therapy (patient reports anaphylaxis with ASA though this is a reported current medication and care everywhere through his PCP though patient denies taking aspirin. He is uncertain how long he has been off Plavix (no recent PCI)  WOund continue carvedilol  COntine statin  Add imdur 30 -Recommend repletion of potassium to at least goal 4.0 --Check lipid panel and hemoglobin A1c for further risk stratification Pt has no cardiologist that he follows as outpt.   3. PAF: -Pt had episode of afib with RVR when admitted in Jan 2014  Had  bronchitis at time  Seen by Rod Can  REcomm long term anticoagulation with Xarelto   -He most recently reported taking Xarelto during hospital admission 07/2015 though upon reviewing current medication list through PCP office dating back to 08/2015 patient denied taking this medicine. He is uncertain when he last took [place] at least 4 (HTN, age x 1, DM, vascular disease) Would resume Xarelto   FOllow as outpt.   - 3. Chronic pain syndrome: -Patient has received fentanyl 2, MS Contin 2, IV morphine 1, Toradol 1 -Continues to note diffuse generalized pain as above -This is a long-standing issue for him likely playing a role in the above -Needs pain management  4. Hypertension: -Controlled, continue current meds  6. Hyperlipidemia: -Continue Lipitor  7. Hypokalemia: -Replete to goal of at least 4.0 -Check magnesium level and replete to goal of at least 2.0 as indicated   Signed, Christell Faith, PA-C [place] Pager: [phone]  PT seen and examined  I have amended note above by Jackqulyn Livings PT difficult historian  APpears very tired. Complains of CP, weakness, diffuse pain ON exam, pt is sleepy  Neck :  JVP is normal  Lungs with mild wheeze  Cardiac RRR  Gr I-II/VI systolic murmur Chest  Mild tender  Abd benign  Ext without edema  Pt says CP is ongoing  Worse with turning in bed or deep breath  I do not think above symptoms represent active ischemia  Treat with pain meds  I would not pursue ischemic eval unless symptoms change  In regards to Atrial fib  He was seen in Jan 2018 by Rod Can  In Afib with RVR  May have had bronchitis at time   Otherwise not attrib to signif illness  Recommended long term Xarelto  I would resume for PAF  Follow as outpt.  Dorris Carnes    09/21/2016, 9:28 AM

## 2016-09-21 NOTE — Progress Notes (Signed)
*  PRELIMINARY RESULTS* Echocardiogram 2D Echocardiogram has been performed. Definity Contrast requested & used on this study.  Bryan Scott 09/21/2016, 12:10 PM

## 2016-09-21 NOTE — Progress Notes (Signed)
[place] at [facility]                                                                                                                                                                                  Patient Demographics   Bryan Scott, is a 71 y.o. male, DOB - December 31, 1945, KGM:[id number]  Admit date - 09/20/2016   Admitting Physician Henreitta Leber, MD  Outpatient Primary MD for the patient is [name], MD   LOS - 0  Subjective: Patient admitted with chest pain. When I went to see him today he states that his chest hurts back hurts. He also states that he does not feel good. When asked why he says that he just sick. He takes chronic morphine and states that that doesn't work    Review of Systems:   CONSTITUTIONAL: No documented fever. No fatigue, weakness. No weight gain, no weight loss.  EYES: No blurry or double vision.  ENT: No tinnitus. No postnasal drip. No redness of the oropharynx.  RESPIRATORY: No cough, no wheeze, no hemoptysis. No dyspnea.  CARDIOVASCULAR: No chest pain. No orthopnea. No palpitations. No syncope.  GASTROINTESTINAL: No nausea, no vomiting or diarrhea. No abdominal pain. No melena or hematochezia.  GENITOURINARY: No dysuria or hematuria.  ENDOCRINE: No polyuria or nocturia. No heat or cold intolerance.  HEMATOLOGY: No anemia. No bruising. No bleeding.  INTEGUMENTARY: No rashes. No lesions.  MUSCULOSKELETAL: No arthritis. No swelling. No gout.  NEUROLOGIC: No numbness, tingling, or ataxia. No seizure-type activity.  PSYCHIATRIC: No anxiety. No insomnia. No ADD.    Vitals:   Vitals:   09/20/16 1745 09/20/16 1924 09/21/16 0408 09/21/16 0757  BP: (!) 173/83 (!) 160/66 134/71 (!) 151/73  Pulse: 86 88 77 70  Resp: (!) 22 18  18   Temp: 98.4 F (36.9 C) (!) 101.8 F (38.8 C) 97.5 F (36.4 C) 98.6 F (37 C)  TempSrc: Oral Oral  Oral  SpO2: 99% 92% 92% 93%  Weight:      Height:        Wt Readings from Last 3  Encounters:  09/20/16 220 lb (99.8 kg)  05/02/16 228 lb (103.4 kg)  08/01/15 211 lb 11.2 oz (96 kg)     Intake/Output Summary (Last 24 hours) at 09/21/16 1324 Last data filed at 09/21/16 0735  Gross per 24 hour  Intake                0 ml  Output             1350 ml  Net            -1350 ml  Physical Exam:   GENERAL: Pleasant-appearing in no apparent distress.  HEAD, EYES, EARS, NOSE AND THROAT: Atraumatic, normocephalic. Extraocular muscles are intact. Pupils equal and reactive to light. Sclerae anicteric. No conjunctival injection. No oro-pharyngeal erythema.  NECK: Supple. There is no jugular venous distention. No bruits, no lymphadenopathy, no thyromegaly.  HEART: Regular rate and rhythm,. No murmurs, no rubs, no clicks.  LUNGS: Clear to auscultation bilaterally. No rales or rhonchi. No wheezes.  ABDOMEN: Soft, flat, nontender, nondistended. Has good bowel sounds. No hepatosplenomegaly appreciated.  EXTREMITIES: No evidence of any cyanosis, clubbing, or peripheral edema.  +2 pedal and radial pulses bilaterally.  NEUROLOGIC: The patient is alert, awake, and oriented x3 with no focal motor or sensory deficits appreciated bilaterally.  SKIN: Moist and warm with no rashes appreciated.  Psych: Not anxious, depressed LN: No inguinal LN enlargement    Antibiotics   Anti-infectives    None      Medications   Scheduled Meds: . amLODipine  5 mg Oral Daily  . atorvastatin  10 mg Oral Daily  . carvedilol  37.5 mg Oral Q12H  . enoxaparin (LOVENOX) injection  40 mg Subcutaneous Q24H  . hydrochlorothiazide  25 mg Oral Daily  . magnesium oxide  400 mg Oral BID  . rivaroxaban   Does not apply Once  . sodium chloride flush  3 mL Intravenous Q12H   Continuous Infusions: . sodium chloride 100 mL/hr at 09/21/16 0941   PRN Meds:.acetaminophen **OR** acetaminophen, alum & mag hydroxide-simeth, ketorolac, morphine, nitroGLYCERIN, ondansetron **OR** ondansetron (ZOFRAN) IV,  oxyCODONE-acetaminophen   Data Review:   Micro Results No results found for this or any previous visit (from the past 240 hour(s)).  Radiology Reports Dg Chest 2 View  Result Date: 09/20/2016 CLINICAL DATA:  Chest pain. EXAM: CHEST  2 VIEW COMPARISON:  03/13/2015 is FINDINGS: The cardiac silhouette, mediastinal and hilar contours are within normal limits and stable. Stable surgical changes from bypass surgery. The lungs demonstrate mild chronic bronchitic changes but no infiltrates, edema or effusions. Stable biapical pleural and parenchymal scarring changes. Bilateral nipple shadows are noted. No significant bony findings. IMPRESSION: No acute cardiopulmonary findings. Electronically Signed   By: Marijo Sanes M.D.   On: 09/20/2016 14:20     CBC  Recent Labs Lab 09/20/16 1318 09/21/16 0138  WBC 7.4 6.9  HGB 14.7 14.7  HCT 41.7 41.8  PLT 139* 137*  MCV 94.0 92.9  MCH 33.2 32.7  MCHC 35.3 35.2  RDW 13.3 13.2    Chemistries   Recent Labs Lab 09/20/16 1318 09/21/16 0138 09/21/16 0904  NA 131* 130*  --   K 3.7 3.4*  --   CL 98* 95*  --   CO2 24 22  --   GLUCOSE 276* 302*  --   BUN 15 17  --   CREATININE 1.38* 1.34*  --   CALCIUM 9.2 9.0  --   MG  --   --  1.6*   ------------------------------------------------------------------------------------------------------------------ estimated creatinine clearance is 61.9 mL/min (A) (by C-G formula based on SCr of 1.34 mg/dL (H)). ------------------------------------------------------------------------------------------------------------------ No results for input(s): HGBA1C in the last 72 hours. ------------------------------------------------------------------------------------------------------------------ No results for input(s): CHOL, HDL, LDLCALC, TRIG, CHOLHDL, LDLDIRECT in the last 72 hours. ------------------------------------------------------------------------------------------------------------------ No results for  input(s): TSH, T4TOTAL, T3FREE, THYROIDAB in the last 72 hours.  Invalid input(s): FREET3 ------------------------------------------------------------------------------------------------------------------ No results for input(s): VITAMINB12, FOLATE, FERRITIN, TIBC, IRON, RETICCTPCT in the last 72 hours.  Coagulation profile No results for input(s): INR, PROTIME in the  last 168 hours.  No results for input(s): DDIMER in the last 72 hours.  Cardiac Enzymes  Recent Labs Lab 09/20/16 1803 09/20/16 2141 09/21/16 0138  TROPONINI <0.03 <0.03 <0.03   ------------------------------------------------------------------------------------------------------------------ Invalid input(s): POCBNP    Assessment & Plan   71 year old male with past medical history of coronary artery disease status post bypass, chronic pain syndrome, hypertension, hyperlipidemia, ischemic cardiomyopathy, diabetes type 2 without complication, GERD, came into the hospital due to chest pain.  1. Chest pain- Very atypical symptoms was seen by cardiology and they do not recommend any further testing his cardiac enzymes are negative. I will try some Toradol he's tolerated this before even though says he has aspirin allergy  2. GERD-continue Maalox, Protonix.  3. Chronic pain syndrome-continue MS Contin.   4. Hyperlipidemia-continue atorvastatin.  5. Essential hypertension-continue carvedilol, hydralazine, amlodipine.  6 history of atrial fibrillation he is restarted back on Xarelto per cardiology.      Code Status Orders        Start     Ordered   09/20/16 1746  Full code  Continuous     09/20/16 1745    Code Status History    Date Active Date Inactive Code Status Order ID Comments User Context   07/31/2015  3:32 AM 08/01/2015  6:32 PM Full Code [id number]  Harrie Foreman, MD Inpatient   03/13/2015  2:51 AM 03/16/2015  5:26 PM Full Code [id number]  Lytle Butte, MD ED           Consults   cardilogy   DVT Prophylaxis  Lovenox  Lab Results  Component Value Date   PLT 137 (L) 09/21/2016     Time Spent in minutes   70min  Greater than 50% of time spent in care coordination and counseling patient regarding the condition and plan of care.   Dustin Flock M.D on 09/21/2016 at 1:24 PM  Between 7am to 6pm - Pager - [phone]  After 6pm go to [email or web address] - password [email or web address] [facility]   Office  [phone]

## 2016-09-22 DIAGNOSIS — D696 Thrombocytopenia, unspecified: Secondary | ICD-10-CM | POA: Diagnosis present

## 2016-09-22 DIAGNOSIS — Z8249 Family history of ischemic heart disease and other diseases of the circulatory system: Secondary | ICD-10-CM | POA: Diagnosis not present

## 2016-09-22 DIAGNOSIS — E785 Hyperlipidemia, unspecified: Secondary | ICD-10-CM | POA: Diagnosis present

## 2016-09-22 DIAGNOSIS — I255 Ischemic cardiomyopathy: Secondary | ICD-10-CM | POA: Diagnosis present

## 2016-09-22 DIAGNOSIS — Z955 Presence of coronary angioplasty implant and graft: Secondary | ICD-10-CM | POA: Diagnosis not present

## 2016-09-22 DIAGNOSIS — R079 Chest pain, unspecified: Secondary | ICD-10-CM | POA: Diagnosis present

## 2016-09-22 DIAGNOSIS — E876 Hypokalemia: Secondary | ICD-10-CM | POA: Diagnosis present

## 2016-09-22 DIAGNOSIS — J101 Influenza due to other identified influenza virus with other respiratory manifestations: Secondary | ICD-10-CM | POA: Diagnosis present

## 2016-09-22 DIAGNOSIS — I10 Essential (primary) hypertension: Secondary | ICD-10-CM | POA: Diagnosis present

## 2016-09-22 DIAGNOSIS — Z951 Presence of aortocoronary bypass graft: Secondary | ICD-10-CM | POA: Diagnosis not present

## 2016-09-22 DIAGNOSIS — I4891 Unspecified atrial fibrillation: Secondary | ICD-10-CM | POA: Diagnosis present

## 2016-09-22 DIAGNOSIS — K219 Gastro-esophageal reflux disease without esophagitis: Secondary | ICD-10-CM | POA: Diagnosis present

## 2016-09-22 DIAGNOSIS — G894 Chronic pain syndrome: Secondary | ICD-10-CM | POA: Diagnosis present

## 2016-09-22 DIAGNOSIS — E119 Type 2 diabetes mellitus without complications: Secondary | ICD-10-CM | POA: Diagnosis present

## 2016-09-22 DIAGNOSIS — Z87891 Personal history of nicotine dependence: Secondary | ICD-10-CM | POA: Diagnosis not present

## 2016-09-22 DIAGNOSIS — I480 Paroxysmal atrial fibrillation: Secondary | ICD-10-CM | POA: Diagnosis present

## 2016-09-22 DIAGNOSIS — Z7901 Long term (current) use of anticoagulants: Secondary | ICD-10-CM | POA: Diagnosis not present

## 2016-09-22 DIAGNOSIS — Z888 Allergy status to other drugs, medicaments and biological substances status: Secondary | ICD-10-CM | POA: Diagnosis not present

## 2016-09-22 DIAGNOSIS — I2510 Atherosclerotic heart disease of native coronary artery without angina pectoris: Secondary | ICD-10-CM | POA: Diagnosis present

## 2016-09-22 DIAGNOSIS — Z886 Allergy status to analgesic agent status: Secondary | ICD-10-CM | POA: Diagnosis not present

## 2016-09-22 LAB — BASIC METABOLIC PANEL
ANION GAP: 7 (ref 5–15)
BUN: 29 mg/dL — ABNORMAL HIGH (ref 6–20)
CO2: 27 mmol/L (ref 22–32)
Calcium: 8.4 mg/dL — ABNORMAL LOW (ref 8.9–10.3)
Chloride: 96 mmol/L — ABNORMAL LOW (ref 101–111)
Creatinine, Ser: 1.37 mg/dL — ABNORMAL HIGH (ref 0.61–1.24)
GFR calc Af Amer: 58 mL/min — ABNORMAL LOW (ref >60)
GFR, EST NON AFRICAN AMERICAN: 50 mL/min — AB (ref >60)
Glucose, Bld: 364 mg/dL — ABNORMAL HIGH (ref 65–99)
POTASSIUM: 3.5 mmol/L (ref 3.5–5.1)
SODIUM: 130 mmol/L — AB (ref 135–145)

## 2016-09-22 LAB — URINALYSIS, COMPLETE (UACMP) WITH MICROSCOPIC
Bacteria, UA: NONE SEEN
Bilirubin Urine: NEGATIVE
Ketones, ur: 20 mg/dL — AB
Leukocytes, UA: NEGATIVE
Nitrite: NEGATIVE
PH: 6 (ref 5.0–8.0)
Protein, ur: 100 mg/dL — AB
SPECIFIC GRAVITY, URINE: 1.017 (ref 1.005–1.030)
SQUAMOUS EPITHELIAL / LPF: NONE SEEN

## 2016-09-22 LAB — LIPID PANEL
Cholesterol: 107 mg/dL (ref 0–200)
HDL: 25 mg/dL — ABNORMAL LOW (ref >40)
LDL CALC: 31 mg/dL (ref 0–99)
Total CHOL/HDL Ratio: 4.3 RATIO
Triglycerides: 253 mg/dL — ABNORMAL HIGH (ref <150)
VLDL: 51 mg/dL — ABNORMAL HIGH (ref 0–40)

## 2016-09-22 LAB — URINE DRUG SCREEN, QUALITATIVE (ARMC ONLY)
Amphetamines, Ur Screen: NOT DETECTED
BARBITURATES, UR SCREEN: NOT DETECTED
Benzodiazepine, Ur Scrn: NOT DETECTED
CANNABINOID 50 NG, UR ~~LOC~~: NOT DETECTED
COCAINE METABOLITE, UR ~~LOC~~: NOT DETECTED
MDMA (Ecstasy)Ur Screen: NOT DETECTED
Methadone Scn, Ur: NOT DETECTED
OPIATE, UR SCREEN: POSITIVE — AB
PHENCYCLIDINE (PCP) UR S: NOT DETECTED
TRICYCLIC, UR SCREEN: NOT DETECTED

## 2016-09-22 LAB — GLUCOSE, CAPILLARY
GLUCOSE-CAPILLARY: 321 mg/dL — AB (ref 65–99)
GLUCOSE-CAPILLARY: 265 mg/dL — AB (ref 65–99)
GLUCOSE-CAPILLARY: 186 mg/dL — AB (ref 65–99)

## 2016-09-22 LAB — INFLUENZA PANEL BY PCR (TYPE A & B)
Influenza A By PCR: NEGATIVE
Influenza B By PCR: POSITIVE — AB

## 2016-09-22 MED ORDER — RIVAROXABAN 20 MG PO TABS: 20.0000 mg | ORAL_TABLET | Freq: Every day | ORAL | 2 refills | Status: DC

## 2016-09-22 MED ORDER — INSULIN ASPART 100 UNIT/ML ~~LOC~~ SOLN
0.0000 [IU] | Freq: Three times a day (TID) | SUBCUTANEOUS | Status: DC
  Administered 2016-09-22: 7 [IU] via SUBCUTANEOUS
  Administered 2016-09-22: 5 [IU] via SUBCUTANEOUS
  Administered 2016-09-23: 2 [IU] via SUBCUTANEOUS
  Administered 2016-09-23 (×2): 3 [IU] via SUBCUTANEOUS
  Administered 2016-09-24: 2 [IU] via SUBCUTANEOUS
  Filled 2016-09-22: qty 7
  Filled 2016-09-22: qty 5
  Filled 2016-09-22 (×2): qty 3
  Filled 2016-09-22: qty 2
  Filled 2016-09-22: qty 3

## 2016-09-22 MED ORDER — MORPHINE SULFATE ER 15 MG PO TBCR
15.0000 mg | EXTENDED_RELEASE_TABLET | Freq: Three times a day (TID) | ORAL | Status: DC
  Administered 2016-09-22 – 2016-09-24 (×4): 15 mg via ORAL
  Filled 2016-09-22 (×4): qty 1

## 2016-09-22 MED ORDER — GLIPIZIDE ER 10 MG PO TB24
10.0000 mg | ORAL_TABLET | Freq: Every day | ORAL | Status: DC
  Administered 2016-09-22 – 2016-09-24 (×3): 10 mg via ORAL
  Filled 2016-09-22 (×3): qty 1

## 2016-09-22 MED ORDER — GUAIFENESIN-DM 100-10 MG/5ML PO SYRP
5.0000 mL | ORAL_SOLUTION | ORAL | Status: DC | PRN
  Administered 2016-09-22 – 2016-09-24 (×3): 5 mL via ORAL
  Filled 2016-09-22 (×3): qty 5

## 2016-09-22 MED ORDER — OSELTAMIVIR PHOSPHATE 75 MG PO CAPS
75.0000 mg | ORAL_CAPSULE | Freq: Two times a day (BID) | ORAL | Status: DC
  Administered 2016-09-22 – 2016-09-24 (×5): 75 mg via ORAL
  Filled 2016-09-22 (×5): qty 1

## 2016-09-22 NOTE — Progress Notes (Signed)
PT Cancellation Note  Patient Details Name: Bryan Scott MRN: [id number] DOB: 07-05-1945   Cancelled Treatment:    Reason Eval/Treat Not Completed: Patient declined, no reason specified. Pt seated EOB eating breakfast upon arrival. Pt refused eval this morning as he stated he's too weak and tired at this time but would be willing to this afternoon. [name], PT,DPT 09/22/16 9:05 AM      [name] L 09/22/2016, 9:05 AM

## 2016-09-22 NOTE — Plan of Care (Signed)
Problem: Pain Managment: Goal: General experience of comfort will improve Outcome: Not Progressing Patient still c/o pain. Pain medicine ordered and given. Patient also has c/o nausea. Zofran given. Will continue to assess and monitor.   Problem: Physical Regulation: Goal: Ability to maintain clinical measurements within normal limits will improve Outcome: Not Progressing Patient experienced low grade temps overnight and early this morning.  Goal: Will remain free from infection Outcome: Not Progressing Patient swabbed and tested positive for Influenza B,  isolation precautions initiated, patient educated and started on Tamiflu.

## 2016-09-22 NOTE — Discharge Instructions (Addendum)
[place] at [place]:  Cardiac diet diabetic diet  DISCHARGE CONDITION:  Stable  ACTIVITY:  Activity as tolerated  OXYGEN:  Home Oxygen: No.   Oxygen Delivery: room air  DISCHARGE LOCATION:  home    ADDITIONAL DISCHARGE INSTRUCTION: dont take hctz bcz sodium little low until seen by primary md   If you experience worsening of your admission symptoms, develop shortness of breath, life threatening emergency, suicidal or homicidal thoughts you must seek medical attention immediately by calling 911 or calling your MD immediately  if symptoms less severe.  You Must read complete instructions/literature along with all the possible adverse reactions/side effects for all the Medicines you take and that have been prescribed to you. Take any new Medicines after you have completely understood and accpet all the possible adverse reactions/side effects.   Please note  You were cared for by a hospitalist during your hospital stay. If you have any questions about your discharge medications or the care you received while you were in the hospital after you are discharged, you can call the unit and asked to speak with the hospitalist on call if the hospitalist that took care of you is not available. Once you are discharged, your primary care physician will handle any further medical issues. Please note that NO REFILLS for any discharge medications will be authorized once you are discharged, as it is imperative that you return to your primary care physician (or establish a relationship with a primary care physician if you do not have one) for your aftercare needs so that they can reassess your need for medications and monitor your lab values.

## 2016-09-22 NOTE — Care Management (Addendum)
Cardiac history and placed in observation for chest pain. Troponins negative.  Patient has declined physical therapy yesterday and today.  Cardiology consulting. No discharge needs identified by members of the care team

## 2016-09-22 NOTE — Progress Notes (Signed)
Inpatient Diabetes Program Recommendations  AACE/ADA: New Consensus Statement on Inpatient Glycemic Control (2015)  Target Ranges:  Prepandial:   less than 140 mg/dL      Peak postprandial:   less than 180 mg/dL (1-2 hours)      Critically ill patients:  140 - 180 mg/dL   Lab Results  Component Value Date   GLUCAP 246 (H) 08/01/2015   HGBA1C 6.6 (H) 07/31/2015    Review of Glycemic Control:Results for LEONTAE, BOSTOCK (MRN [id number]) as of 09/22/2016 11:52  Ref. Range 09/20/2016 13:18 09/21/2016 01:38 09/22/2016 02:51  Glucose Latest Ref Range: 65 - 99 mg/dL 276 (H) 302 (H) 364 (H)     Diabetes history: Type 2 diabetes Outpatient Diabetes medications: Patient unsure of medication he takes for "sugar"- None listed in PCP notes Current orders for Inpatient glycemic control:  Glucotrol XL 10 mg with breakfast, Novolog sensitive tid with meals  Inpatient Diabetes Program Recommendations:   A1C pending. Lab glucose's have been elevated.  Consider adding Lantus 20 units daily if CBG's remain greater than goal.  Thanks, Adah Perl, RN, BC-ADM Inpatient Diabetes Coordinator Pager [phone] (8a-5p)

## 2016-09-22 NOTE — Progress Notes (Signed)
[place] at [facility]                                                                                                                                                                                  Patient Demographics   Bryan Scott, is a 71 y.o. male, DOB - [birth year], WUJ:[id number]  Admit date - 09/20/2016   Admitting Physician Henreitta Leber, MD  Outpatient Primary MD for the patient is [name], MD   LOS - 0  Subjective: Patient continues to have pain in different parts of his body. Also has fever. now  Review of Systems:   CONSTITUTIONAL: Positive fever. No fatigue, weakness. No weight gain, no weight loss.  EYES: No blurry or double vision.  ENT: No tinnitus. No postnasal drip. No redness of the oropharynx.  RESPIRATORY: No cough, no wheeze, no hemoptysis. No dyspnea.  CARDIOVASCULAR: No chest pain. No orthopnea. No palpitations. No syncope.  GASTROINTESTINAL: No nausea, no vomiting or diarrhea. No abdominal pain. No melena or hematochezia.  GENITOURINARY: No dysuria or hematuria.  ENDOCRINE: No polyuria or nocturia. No heat or cold intolerance.  HEMATOLOGY: No anemia. No bruising. No bleeding.  INTEGUMENTARY: No rashes. No lesions.  MUSCULOSKELETAL: No arthritis. No swelling. No gout.  NEUROLOGIC: No numbness, tingling, or ataxia. No seizure-type activity.  PSYCHIATRIC: No anxiety. No insomnia. No ADD.    Vitals:   Vitals:   09/21/16 2011 09/22/16 0412 09/22/16 0721 09/22/16 1341  BP: 116/80 (!) 147/65 132/63 (!) 102/46  Pulse: 64 66 66 (!) 32  Resp: 16 16 15 12   Temp: 100.3 F (37.9 C) 100.3 F (37.9 C) 99.2 F (37.3 C) (!) 100.4 F (38 C)  TempSrc: Oral Oral Oral Oral  SpO2: 94% 93% 96% 93%  Weight:      Height:        Wt Readings from Last 3 Encounters:  09/20/16 220 lb (99.8 kg)  05/02/16 228 lb (103.4 kg)  08/01/15 211 lb 11.2 oz (96 kg)     Intake/Output Summary (Last 24 hours) at 09/22/16 1402 Last data filed  at 09/22/16 0900  Gross per 24 hour  Intake             1640 ml  Output             2850 ml  Net            -1210 ml    Physical Exam:   GENERAL: Pleasant-appearing in no apparent distress.  HEAD, EYES, EARS, NOSE AND THROAT: Atraumatic, normocephalic. Extraocular muscles are intact. Pupils equal and reactive to light. Sclerae anicteric. No conjunctival injection. No oro-pharyngeal erythema.  NECK: Supple.  There is no jugular venous distention. No bruits, no lymphadenopathy, no thyromegaly.  HEART: Regular rate and rhythm,. No murmurs, no rubs, no clicks.  LUNGS: Clear to auscultation bilaterally. No rales or rhonchi. No wheezes.  ABDOMEN: Soft, flat, nontender, nondistended. Has good bowel sounds. No hepatosplenomegaly appreciated.  EXTREMITIES: No evidence of any cyanosis, clubbing, or peripheral edema.  +2 pedal and radial pulses bilaterally.  NEUROLOGIC: The patient is alert, awake, and oriented x3 with no focal motor or sensory deficits appreciated bilaterally.  SKIN: Moist and warm with no rashes appreciated.  Psych: Not anxious, depressed LN: No inguinal LN enlargement    Antibiotics   Anti-infectives    Start     Dose/Rate Route Frequency Ordered Stop   09/22/16 1015  oseltamivir (TAMIFLU) capsule 75 mg     75 mg Oral 2 times daily 09/22/16 1008 09/27/16 0959      Medications   Scheduled Meds: . amLODipine  5 mg Oral Daily  . atorvastatin  10 mg Oral Daily  . carvedilol  37.5 mg Oral Q12H  . enoxaparin (LOVENOX) injection  40 mg Subcutaneous Q24H  . glipiZIDE  10 mg Oral Q breakfast  . insulin aspart  0-9 Units Subcutaneous TID WC  . magnesium oxide  400 mg Oral BID  . oseltamivir  75 mg Oral BID  . sodium chloride flush  3 mL Intravenous Q12H   Continuous Infusions: . sodium chloride 100 mL/hr at 09/22/16 0650   PRN Meds:.acetaminophen **OR** acetaminophen, alum & mag hydroxide-simeth, ketorolac, morphine, nitroGLYCERIN, ondansetron **OR** ondansetron (ZOFRAN)  IV, oxyCODONE-acetaminophen   Data Review:   Micro Results No results found for this or any previous visit (from the past 240 hour(s)).  Radiology Reports Dg Chest 2 View  Result Date: 09/20/2016 CLINICAL DATA:  Chest pain. EXAM: CHEST  2 VIEW COMPARISON:  03/13/2015 is FINDINGS: The cardiac silhouette, mediastinal and hilar contours are within normal limits and stable. Stable surgical changes from bypass surgery. The lungs demonstrate mild chronic bronchitic changes but no infiltrates, edema or effusions. Stable biapical pleural and parenchymal scarring changes. Bilateral nipple shadows are noted. No significant bony findings. IMPRESSION: No acute cardiopulmonary findings. Electronically Signed   By: Marijo Sanes M.D.   On: 09/20/2016 14:20     CBC  Recent Labs Lab 09/20/16 1318 09/21/16 0138  WBC 7.4 6.9  HGB 14.7 14.7  HCT 41.7 41.8  PLT 139* 137*  MCV 94.0 92.9  MCH 33.2 32.7  MCHC 35.3 35.2  RDW 13.3 13.2    Chemistries   Recent Labs Lab 09/20/16 1318 09/21/16 0138 09/21/16 0904 09/22/16 0251  NA 131* 130*  --  130*  K 3.7 3.4*  --  3.5  CL 98* 95*  --  96*  CO2 24 22  --  27  GLUCOSE 276* 302*  --  364*  BUN 15 17  --  29*  CREATININE 1.38* 1.34*  --  1.37*  CALCIUM 9.2 9.0  --  8.4*  MG  --   --  1.6*  --    ------------------------------------------------------------------------------------------------------------------ estimated creatinine clearance is 60.5 mL/min (A) (by C-G formula based on SCr of 1.37 mg/dL (H)). ------------------------------------------------------------------------------------------------------------------ No results for input(s): HGBA1C in the last 72 hours. ------------------------------------------------------------------------------------------------------------------  Recent Labs  09/22/16 0251  CHOL 107  HDL 25*  LDLCALC 31  TRIG 253*  CHOLHDL 4.3    ------------------------------------------------------------------------------------------------------------------ No results for input(s): TSH, T4TOTAL, T3FREE, THYROIDAB in the last 72 hours.  Invalid input(s): FREET3 ------------------------------------------------------------------------------------------------------------------ No results  for input(s): VITAMINB12, FOLATE, FERRITIN, TIBC, IRON, RETICCTPCT in the last 72 hours.  Coagulation profile No results for input(s): INR, PROTIME in the last 168 hours.  No results for input(s): DDIMER in the last 72 hours.  Cardiac Enzymes  Recent Labs Lab 09/20/16 1803 09/20/16 2141 09/21/16 0138  TROPONINI <0.03 <0.03 <0.03   ------------------------------------------------------------------------------------------------------------------ Invalid input(s): POCBNP    Assessment & Plan   71 year old male with past medical history of coronary artery disease status post bypass, chronic pain syndrome, hypertension, hyperlipidemia, ischemic cardiomyopathy, diabetes type 2 without complication, GERD, came into the hospital due to chest pain.  1. Chest pain- Very atypical symptoms He has pain in multiple areas of his body fluid test was checked and it shows influenza B positive  2. Influenza B + start patient on Tamiflu supportive care  3. Chronic pain syndrome-continue MS Contin.   4. Hyperlipidemia-continue atorvastatin.  5. Essential hypertension-continue carvedilol, hydralazine, amlodipine. Stop hctz to due to low na continue IVF  6 history of atrial fibrillation he is restarted back on Xarelto per cardiology.      Code Status Orders        Start     Ordered   09/20/16 1746  Full code  Continuous     09/20/16 1745    Code Status History    Date Active Date Inactive Code Status Order ID Comments User Context   07/31/2015  3:32 AM 08/01/2015  6:32 PM Full Code [id number]  Harrie Foreman, MD Inpatient   03/13/2015   2:51 AM 03/16/2015  5:26 PM Full Code [id number]  Lytle Butte, MD ED           Consults  cardilogy   DVT Prophylaxis  Lovenox  Lab Results  Component Value Date   PLT 137 (L) 09/21/2016     Time Spent in minutes   92min  Greater than 50% of time spent in care coordination and counseling patient regarding the condition and plan of care.   Dustin Flock M.D on 09/22/2016 at 2:02 PM  Between 7am to 6pm - Pager - [phone]  After 6pm go to [email or web address] - password [email or web address] [place] [facility]   Office  [phone]

## 2016-09-23 LAB — HEMOGLOBIN A1C
HEMOGLOBIN A1C: 10 % — AB (ref 4.8–5.6)
Mean Plasma Glucose: 240 mg/dL

## 2016-09-23 LAB — GLUCOSE, CAPILLARY
GLUCOSE-CAPILLARY: 212 mg/dL — AB (ref 65–99)
GLUCOSE-CAPILLARY: 207 mg/dL — AB (ref 65–99)
GLUCOSE-CAPILLARY: 172 mg/dL — AB (ref 65–99)
Glucose-Capillary: 212 mg/dL — ABNORMAL HIGH (ref 65–99)

## 2016-09-23 MED ORDER — LOPERAMIDE HCL 2 MG PO CAPS
2.0000 mg | ORAL_CAPSULE | Freq: Four times a day (QID) | ORAL | Status: DC | PRN
  Administered 2016-09-23: 2 mg via ORAL
  Filled 2016-09-23: qty 1

## 2016-09-23 MED ORDER — POLYETHYLENE GLYCOL 3350 17 G PO PACK
17.0000 g | PACK | Freq: Every day | ORAL | Status: DC | PRN
  Filled 2016-09-23: qty 1

## 2016-09-23 NOTE — Progress Notes (Signed)
[place] at [facility]                                                                                                                                                                                  Patient Demographics   Bryan Scott, is a 71 y.o. male, DOB - [birth year], ZOX:[id number]  Admit date - 09/20/2016   Admitting Physician Henreitta Leber, MD  Outpatient Primary MD for the patient is [name], MD   LOS - 1  Subjective:Still not feeling very well. Complains of pain in his back  Review of Systems:   CONSTITUTIONAL: Positive fever. No fatigue, weakness. No weight gain, no weight loss.  EYES: No blurry or double vision.  ENT: No tinnitus. No postnasal drip. No redness of the oropharynx.  RESPIRATORY: No cough, no wheeze, no hemoptysis. No dyspnea.  CARDIOVASCULAR: No chest pain. No orthopnea. No palpitations. No syncope.  GASTROINTESTINAL: No nausea, no vomiting or diarrhea. No abdominal pain. No melena or hematochezia.  GENITOURINARY: No dysuria or hematuria.  ENDOCRINE: No polyuria or nocturia. No heat or cold intolerance.  HEMATOLOGY: No anemia. No bruising. No bleeding.  INTEGUMENTARY: No rashes. No lesions.  MUSCULOSKELETAL: No arthritis. No swelling. No gout. Positive back pain NEUROLOGIC: No numbness, tingling, or ataxia. No seizure-type activity.  PSYCHIATRIC: No anxiety. No insomnia. No ADD.    Vitals:   Vitals:   09/22/16 1341 09/22/16 1938 09/23/16 0544 09/23/16 1213  BP: (!) 102/46 (!) 120/59 136/79 (!) 142/70  Pulse: (!) 32 (!) 59 61 (!) 54  Resp: 12 18 18 20   Temp: (!) 100.4 F (38 C) 99.5 F (37.5 C) 98.3 F (36.8 C) 99 F (37.2 C)  TempSrc: Oral Oral Oral Oral  SpO2: 93% 96% 98% 96%  Weight:      Height:        Wt Readings from Last 3 Encounters:  09/20/16 220 lb (99.8 kg)  05/02/16 228 lb (103.4 kg)  08/01/15 211 lb 11.2 oz (96 kg)     Intake/Output Summary (Last 24 hours) at 09/23/16 1359 Last data filed  at 09/23/16 0544  Gross per 24 hour  Intake                0 ml  Output              850 ml  Net             -850 ml    Physical Exam:   GENERAL: Pleasant-appearing in no apparent distress.  HEAD, EYES, EARS, NOSE AND THROAT: Atraumatic, normocephalic. Extraocular muscles are intact. Pupils equal and reactive to light. Sclerae anicteric. No conjunctival injection. No  oro-pharyngeal erythema.  NECK: Supple. There is no jugular venous distention. No bruits, no lymphadenopathy, no thyromegaly.  HEART: Regular rate and rhythm,. No murmurs, no rubs, no clicks.  LUNGS: Clear to auscultation bilaterally. No rales or rhonchi. No wheezes.  ABDOMEN: Soft, flat, nontender, nondistended. Has good bowel sounds. No hepatosplenomegaly appreciated.  EXTREMITIES: No evidence of any cyanosis, clubbing, or peripheral edema.  +2 pedal and radial pulses bilaterally.  NEUROLOGIC: The patient is alert, awake, and oriented x3 with no focal motor or sensory deficits appreciated bilaterally.  SKIN: Moist and warm with no rashes appreciated.  Psych: Not anxious, depressed LN: No inguinal LN enlargement    Antibiotics   Anti-infectives    Start     Dose/Rate Route Frequency Ordered Stop   09/22/16 1015  oseltamivir (TAMIFLU) capsule 75 mg     75 mg Oral 2 times daily 09/22/16 1008 09/27/16 0959      Medications   Scheduled Meds: . amLODipine  5 mg Oral Daily  . atorvastatin  10 mg Oral Daily  . carvedilol  37.5 mg Oral Q12H  . enoxaparin (LOVENOX) injection  40 mg Subcutaneous Q24H  . glipiZIDE  10 mg Oral Q breakfast  . insulin aspart  0-9 Units Subcutaneous TID WC  . magnesium oxide  400 mg Oral BID  . morphine  15 mg Oral Q8H  . oseltamivir  75 mg Oral BID  . sodium chloride flush  3 mL Intravenous Q12H   Continuous Infusions:  PRN Meds:.acetaminophen **OR** acetaminophen, alum & mag hydroxide-simeth, guaiFENesin-dextromethorphan, ketorolac, nitroGLYCERIN, ondansetron **OR** ondansetron  (ZOFRAN) IV, oxyCODONE-acetaminophen, polyethylene glycol   Data Review:   Micro Results No results found for this or any previous visit (from the past 240 hour(s)).  Radiology Reports Dg Chest 2 View  Result Date: 09/20/2016 CLINICAL DATA:  Chest pain. EXAM: CHEST  2 VIEW COMPARISON:  03/13/2015 is FINDINGS: The cardiac silhouette, mediastinal and hilar contours are within normal limits and stable. Stable surgical changes from bypass surgery. The lungs demonstrate mild chronic bronchitic changes but no infiltrates, edema or effusions. Stable biapical pleural and parenchymal scarring changes. Bilateral nipple shadows are noted. No significant bony findings. IMPRESSION: No acute cardiopulmonary findings. Electronically Signed   By: Marijo Sanes M.D.   On: 09/20/2016 14:20     CBC  Recent Labs Lab 09/20/16 1318 09/21/16 0138  WBC 7.4 6.9  HGB 14.7 14.7  HCT 41.7 41.8  PLT 139* 137*  MCV 94.0 92.9  MCH 33.2 32.7  MCHC 35.3 35.2  RDW 13.3 13.2    Chemistries   Recent Labs Lab 09/20/16 1318 09/21/16 0138 09/21/16 0904 09/22/16 0251  NA 131* 130*  --  130*  K 3.7 3.4*  --  3.5  CL 98* 95*  --  96*  CO2 24 22  --  27  GLUCOSE 276* 302*  --  364*  BUN 15 17  --  29*  CREATININE 1.38* 1.34*  --  1.37*  CALCIUM 9.2 9.0  --  8.4*  MG  --   --  1.6*  --    ------------------------------------------------------------------------------------------------------------------ estimated creatinine clearance is 60.5 mL/min (A) (by C-G formula based on SCr of 1.37 mg/dL (H)). ------------------------------------------------------------------------------------------------------------------  Recent Labs  09/22/16 0251  HGBA1C 10.0*   ------------------------------------------------------------------------------------------------------------------  Recent Labs  09/22/16 0251  CHOL 107  HDL 25*  LDLCALC 31  TRIG 253*  CHOLHDL 4.3    ------------------------------------------------------------------------------------------------------------------ No results for input(s): TSH, T4TOTAL, T3FREE, THYROIDAB in the last 72 hours.  Invalid input(s): FREET3 ------------------------------------------------------------------------------------------------------------------ No results for input(s): VITAMINB12, FOLATE, FERRITIN, TIBC, IRON, RETICCTPCT in the last 72 hours.  Coagulation profile No results for input(s): INR, PROTIME in the last 168 hours.  No results for input(s): DDIMER in the last 72 hours.  Cardiac Enzymes  Recent Labs Lab 09/20/16 1803 09/20/16 2141 09/21/16 0138  TROPONINI <0.03 <0.03 <0.03   ------------------------------------------------------------------------------------------------------------------ Invalid input(s): POCBNP    Assessment & Plan   71 year old male with past medical history of coronary artery disease status post bypass, chronic pain syndrome, hypertension, hyperlipidemia, ischemic cardiomyopathy, diabetes type 2 without complication, GERD, came into the hospital due to chest pain.  1. Chest pain-Noncardiac chest pain Very atypical symptoms He has pain in multiple areas of his body fluid test was checked and it shows influenza B positive  2. Influenza B + continue Tamiflu supportive care  3. Chronic pain syndrome-continue MS Contin.   4. Hyperlipidemia-continue atorvastatin.  5. Essential hypertension-continue carvedilol, hydralazine, amlodipine. Stop hctz to due to low na continue IVF  6 history of atrial fibrillation   Xarelto per cardiology.      Code Status Orders        Start     Ordered   09/20/16 1746  Full code  Continuous     09/20/16 1745    Code Status History    Date Active Date Inactive Code Status Order ID Comments User Context   07/31/2015  3:32 AM 08/01/2015  6:32 PM Full Code [id number]  Harrie Foreman, MD Inpatient   03/13/2015  2:51 AM  03/16/2015  5:26 PM Full Code [id number]  Lytle Butte, MD ED           Consults  cardilogy   DVT Prophylaxis  Lovenox  Lab Results  Component Value Date   PLT 137 (L) 09/21/2016     Time Spent in minutes   51min  Greater than 50% of time spent in care coordination and counseling patient regarding the condition and plan of care.   Dustin Flock M.D on 09/23/2016 at 1:59 PM  Between 7am to 6pm - Pager - [phone]  After 6pm go to [email or web address] - password [email or web address] [facility]   Office  [phone]

## 2016-09-23 NOTE — Care Management Important Message (Signed)
Important Message  Patient Details  Name: Bryan Scott MRN: [id number] Date of Birth: [birth year]   Medicare Important Message Given:  Yes Signed IM notice given    Katrina Stack, RN 09/23/2016, 4:12 PM

## 2016-09-23 NOTE — Evaluation (Signed)
Physical Therapy Evaluation Patient Details Name: Bryan Scott MRN: [id number] DOB: [birth year] Today's Date: 09/23/2016   History of Present Illness  71 y.o. male with a known history of Coronary artery disease status post bypass, chronic pain syndrome, hypertension, hyperlipidemia, paroxysmal atrial fibrillation, ischemic cardiomyopathy, history of tobacco abuse, multiple spinal surgeries who presents to the hospital complaining of chest pain. Patient describes his chest pain as achiness in the center of his chest nonradiating associated with some nausea vomiting and shortness of breath and weakness.  Pt found to have the flu.  Clinical Impression  Pt does not feel that he can do a lot secondary to feeling so ill from the flu, but generally he showed good in-home appropriate ambulation and mobility and despite needing some UE use on walls/surfaces he reported feeling okay about going home safety and that his sister will be able to help him with running errands until he is strong enough to get out.  He is not interested in [place], suggested that he may need some if he is still very weak when it is time to d/c from [facility].  Pt's O2 remained steady t/o the effort and he did not display excessive fatigue despite reporting being very tired post 45 ft of in-room ambulation.     Follow Up Recommendations Home health PT (pt reports he does not want HHPT, appropriate if he improves)    Equipment Recommendations       Recommendations for Other Services       Precautions / Restrictions Precautions Precautions: Fall Restrictions Weight Bearing Restrictions: No      Mobility  Bed Mobility Overal bed mobility: Independent             General bed mobility comments: Pt able to get himself sitting up at EOB w/o assist  Transfers Overall transfer level: Independent Equipment used: None             General transfer comment: Pt able to rise to standing w/o AD, did use UEs to get to  standing  Ambulation/Gait Ambulation/Gait assistance: Supervision Ambulation Distance (Feet): 45 Feet Assistive device: None (Regular use of UEs on walls, surfaces, etc)       General Gait Details: Pt with slow but relatively confident ambulation.  He reports feeling very tired with the effort today, but his vitals remain stable and appropriate.  Pt with no LOBs and able to take numerous steps w/o holding anything.   Stairs            Wheelchair Mobility    Modified Rankin (Stroke Patients Only)       Balance Overall balance assessment: Modified Independent                                           Pertinent Vitals/Pain Pain Assessment:  (chronic pain all over, chronic morphine, etc - new flu ache)    Home Living Family/patient expects to be discharged to:: Private residence Living Arrangements: Alone Available Help at Discharge: Family (sister)   Home Access: Stairs to enter Entrance Stairs-Rails: Chemical engineer of Steps: 4   Home Equipment: Environmental consultant - 4 wheels      Prior Function Level of Independence: Independent with assistive device(s)         Comments: Pt reports he gets out to run errands regularly, uses rollator outside the home     Hand Dominance  Extremity/Trunk Assessment   Upper Extremity Assessment Upper Extremity Assessment: Generalized weakness;Overall [facility] for tasks assessed    Lower Extremity Assessment Lower Extremity Assessment: Overall WFL for tasks assessed;Generalized weakness       Communication   Communication: No difficulties  Cognition Arousal/Alertness: Awake/alert Behavior During Therapy: WFL for tasks assessed/performed Overall Cognitive Status: Within Functional Limits for tasks assessed                                        General Comments      Exercises     Assessment/Plan    PT Assessment Patient needs continued PT services  PT Problem List  Decreased strength;Decreased activity tolerance;Decreased balance;Decreased mobility;Decreased safety awareness;Decreased knowledge of use of DME       PT Treatment Interventions DME instruction;Gait training;Stair training;Functional mobility training;Therapeutic exercise;Therapeutic activities;Balance training;Neuromuscular re-education;Patient/family education    PT Goals (Current goals can be found in the Care Plan section)  Acute Rehab PT Goals Patient Stated Goal: I just want to kick this flu and go home PT Goal Formulation: With patient Time For Goal Achievement: 10/07/16 Potential to Achieve Goals: Fair    Frequency Min 2X/week   Barriers to discharge        Co-evaluation               End of Session Equipment Utilized During Treatment: Gait belt Activity Tolerance: Patient limited by fatigue Patient left: with call bell/phone within reach;with chair alarm set Nurse Communication: Mobility status PT Visit Diagnosis: Muscle weakness (generalized) (M62.81);Difficulty in walking, not elsewhere classified (R26.2)    Time: 7858-8502 PT Time Calculation (min) (ACUTE ONLY): 19 min   Charges:   PT Evaluation $PT Eval Low Complexity: 1 Procedure     PT G Codes:        Kreg Shropshire, DPT 09/23/2016, 11:41 AM

## 2016-09-23 NOTE — Progress Notes (Addendum)
Inpatient Diabetes Program Recommendations  AACE/ADA: New Consensus Statement on Inpatient Glycemic Control (2015)  Target Ranges:  Prepandial:   less than 140 mg/dL      Peak postprandial:   less than 180 mg/dL (1-2 hours)      Critically ill patients:  140 - 180 mg/dL   Lab Results  Component Value Date   GLUCAP 212 (H) 09/23/2016   HGBA1C 10.0 (H) 09/22/2016    Review of Glycemic Control:  Results for MONTRICE, GRACEY (MRN [id number]) as of 09/23/2016 09:31  Ref. Range 09/22/2016 11:58 09/22/2016 16:50 09/22/2016 21:35 09/23/2016 07:34  Glucose-Capillary Latest Ref Range: 65 - 99 mg/dL 321 (H) 265 (H) 186 (H) 212 (H)   Diabetes history: Type 2 diabetes Outpatient Diabetes medications: Patient unsure of medication he takes for "sugar"- None listed in PCP notes Note that A1C indicates poor control of diabetes in the past 2-3 months.   Inpatient Diabetes Program Recommendations:    Please consider adding Lantus 20 units daily.  Text page sent. May need insulin after d/c due to elevated A1C.   Thanks, Adah Perl, RN, BC-ADM Inpatient Diabetes Coordinator Pager [phone] (8a-5p)  Addendum:  Spoke briefly with patient regarding A1C.  He states he has not taken medications for diabetes in 4 days.  Explained that A1C is reflection of 2-3 month average of blood sugars.  He states that he has PCP. Gave him handout on A1C and "Know your Numbers" for diabetes.  Encouraged follow-up with PCP as well.  Patient was not engaged in conversation and feeling poorly.  Will need follow-up for diabetes.

## 2016-09-23 NOTE — Progress Notes (Signed)
Pt requested PRN Toradol instead of scheduled MS Contin. Pt educated on risks and benefits. Pt refused MS Contin twice saying "It doesn't really do much for me"

## 2016-09-24 LAB — GLUCOSE, CAPILLARY: GLUCOSE-CAPILLARY: 195 mg/dL — AB (ref 65–99)

## 2016-09-24 MED ORDER — GUAIFENESIN-DM 100-10 MG/5ML PO SYRP: 5.0000 mL | ORAL_SOLUTION | ORAL | 0 refills | Status: DC | PRN

## 2016-09-24 MED ORDER — LOPERAMIDE HCL 2 MG PO CAPS: 2.0000 mg | ORAL_CAPSULE | Freq: Four times a day (QID) | ORAL | 0 refills | Status: DC | PRN

## 2016-09-24 MED ORDER — OSELTAMIVIR PHOSPHATE 75 MG PO CAPS: 75.0000 mg | ORAL_CAPSULE | Freq: Two times a day (BID) | ORAL | 0 refills | Status: DC

## 2016-09-24 MED ORDER — TUBERCULIN PPD 5 UNIT/0.1ML ID SOLN
5.0000 [IU] | Freq: Once | INTRADERMAL | Status: DC
  Administered 2016-09-24: 5 [IU] via INTRADERMAL
  Filled 2016-09-24: qty 0.1

## 2016-09-24 NOTE — NC FL2 (Signed)
[place] LEVEL OF CARE SCREENING TOOL     IDENTIFICATION  Patient Name: Bryan Scott Birthdate: 04-18-1946 Sex: male Admission Date (Current Location): 09/20/2016  [facility] and [place] Number:  Engineering geologist and Address:  [facility], [address] [place], [place] [address]      Provider Number: [id number]  Attending Physician Name and Address:  Dustin Flock, MD  Relative Name and Phone Number:       Current Level of Care: Hospital Recommended Level of Care: [place] Prior Approval Number:    Date Approved/Denied:   PASRR Number:    Discharge Plan: Domiciliary (Rest home)    Current Diagnoses: Patient Active Problem List   Diagnosis Date Noted  . Chest pain 09/22/2016  . 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  . Abnormal EKG 09/21/2016  . Chronic pain 09/21/2016  . PAF (paroxysmal atrial fibrillation) ([place]) 09/21/2016  . Essential hypertension 05/02/2016  . Carotid stenosis 05/02/2016  . CAD in native artery   . Atypical chest pain   . Uncontrolled type 2 diabetes mellitus with complication, with long-term current use of insulin ([place])   . Hyperlipidemia   . Pain of left upper extremity   . Angina pectoris ([place])   . Abdominal pain   . Chest pain, central 03/13/2015  . Hypertensive urgency 03/13/2015    Orientation RESPIRATION BLADDER Height & Weight     Self, Time, Situation, Place  Normal Continent Weight: 220 lb (99.8 kg) Height:  6' (182.9 cm)  BEHAVIORAL SYMPTOMS/MOOD NEUROLOGICAL BOWEL NUTRITION STATUS   (none)  (none) Continent Diet (Diet: Heart Health )  AMBULATORY STATUS COMMUNICATION OF NEEDS Skin   Supervision Verbally Normal                       Personal Care Assistance Level of Assistance  Bathing, Feeding, Dressing Bathing Assistance: Limited assistance Feeding assistance: Independent Dressing Assistance: Limited assistance     Functional Limitations  Info  Sight, Hearing, Speech Sight Info: Impaired Hearing Info: Adequate Speech Info: Adequate    SPECIAL CARE FACTORS FREQUENCY  PT (By licensed PT)     PT Frequency:  (2-3 days per week via home health )              Contractures      Additional Factors Info  Code Status, Allergies, Insulin Sliding Scale, Isolation Precautions Code Status Info:  (Full Code. ) Allergies Info:  (Aspergum Aspirin, Heparin, Lisinopril)   Insulin Sliding Scale Info:  (NovoLog Insulin Injections 3 times daily ) Isolation Precautions Info:  (Patient was positive for the flu on 09/22/16 )     Current Medications (09/24/2016):  This is the current hospital active medication list Current Facility-Administered Medications  Medication Dose Route Frequency Provider Last Rate Last Dose  . acetaminophen (TYLENOL) tablet 650 mg  650 mg Oral Q6H PRN Henreitta Leber, MD       Or  . acetaminophen (TYLENOL) suppository 650 mg  650 mg Rectal Q6H PRN Henreitta Leber, MD      . alum & mag hydroxide-simeth (MAALOX/MYLANTA) 200-200-20 MG/5ML suspension 30 mL  30 mL Oral Q6H PRN Henreitta Leber, MD      . amLODipine (NORVASC) tablet 5 mg  5 mg Oral Daily Henreitta Leber, MD   5 mg at 09/23/16 1026  . atorvastatin (LIPITOR) tablet 10 mg  10 mg Oral Daily Henreitta Leber, MD   10  mg at 09/23/16 1026  . carvedilol (COREG) tablet 37.5 mg  37.5 mg Oral Q12H Henreitta Leber, MD   37.5 mg at 09/23/16 2204  . enoxaparin (LOVENOX) injection 40 mg  40 mg Subcutaneous Q24H Henreitta Leber, MD   40 mg at 09/23/16 2204  . glipiZIDE (GLUCOTROL XL) 24 hr tablet 10 mg  10 mg Oral Q breakfast Dustin Flock, MD   10 mg at 09/24/16 0818  . guaiFENesin-dextromethorphan (ROBITUSSIN DM) 100-10 MG/5ML syrup 5 mL  5 mL Oral Q4H PRN Henreitta Leber, MD   5 mL at 09/24/16 0153  . insulin aspart (novoLOG) injection 0-9 Units  0-9 Units Subcutaneous TID WC Dustin Flock, MD   2 Units at 09/24/16 0818  . ketorolac (TORADOL) 30 MG/ML injection  30 mg  30 mg Intravenous Q6H PRN Dustin Flock, MD   30 mg at 09/24/16 0153  . loperamide (IMODIUM) capsule 2 mg  2 mg Oral Q6H PRN Lance Coon, MD   2 mg at 09/23/16 2248  . magnesium oxide (MAG-OX) tablet 400 mg  400 mg Oral BID Dustin Flock, MD   400 mg at 09/23/16 2204  . morphine (MS CONTIN) 12 hr tablet 15 mg  15 mg Oral Q8H Shreyang Patel, MD   15 mg at 09/24/16 0617  . nitroGLYCERIN (NITROSTAT) SL tablet 0.4 mg  0.4 mg Sublingual Q5 min PRN Henreitta Leber, MD      . ondansetron [facility]) tablet 4 mg  4 mg Oral Q6H PRN Henreitta Leber, MD   4 mg at 09/22/16 1819   Or  . ondansetron (ZOFRAN) injection 4 mg  4 mg Intravenous Q6H PRN Henreitta Leber, MD   4 mg at 09/24/16 0153  . oseltamivir (TAMIFLU) capsule 75 mg  75 mg Oral BID Dustin Flock, MD   75 mg at 09/23/16 2208  . oxyCODONE-acetaminophen (PERCOCET/ROXICET) 5-325 MG per tablet 1 tablet  1 tablet Oral Q6H PRN Dustin Flock, MD      . polyethylene glycol (MIRALAX / GLYCOLAX) packet 17 g  17 g Oral Daily PRN Lance Coon, MD      . sodium chloride flush (NS) 0.9 % injection 3 mL  3 mL Intravenous Q12H Henreitta Leber, MD   3 mL at 09/23/16 2208  . tuberculin injection 5 Units  5 Units Intradermal Once Dustin Flock, MD         Discharge Medications: Please see discharge summary for a list of discharge medications.  Relevant Imaging Results:  Relevant Lab Results:   Additional Information  (SSN: [id number])  Kaisha Wachob, Veronia Beets, LCSW

## 2016-09-24 NOTE — Progress Notes (Signed)
Patient alert and oriented, vss, no complaints of pain.  D/C PIV.  No questions at this time.  PPD given on Left forearm, marked with black marker.    To be escorted out of hospital via wheelchair by volunteers.

## 2016-09-24 NOTE — Discharge Summary (Signed)
[facility] [place] [facility] [place], 71 y.o., DOB [birth year], MRN [id number]. Admission date: 09/20/2016 Discharge Date 09/24/2016 Primary MD [name], MD Admitting Physician Henreitta Leber, MD  Admission Diagnosis  Chest pain, unspecified type [R07.9]  Discharge Diagnosis   Principal Problem:   Atypical chest pain  due to the flu   CAD in native artery   Status post coronary artery bypass grafting   Abnormal EKG   Chronic pain   PAF (paroxysmal atrial fibrillation) (HCC)   Chest pain  Coronary artery disease status post  CABG       Hospital Course  Bryan Scott  is a 71 y.o. male with a known history of Coronary artery disease status post bypass, chronic pain syndrome, hypertension, hyperlipidemia, paroxysmal atrial fibrillation, ischemic cardiomyopathy, history of tobacco abuse who presents to the hospital complaining of chest pain. Patient describes his chest pain as achiness in the center of his chest nonradiating associated with some nausea vomiting and shortness of breath and weakness. Patient's evaluation in the ED was negative cardiac enzymes and EKG showed no evidence of cardiac cause for his symptoms. He was seen by cardiology and they initially scheduled the stress test which was subsequently canceled. They felt that his symptoms were noncardiac. Subsequently patient started having low-grade temperature there for a flu test was checked which came positive. He was started on Tamiflu. He is very weak and continues to complaint of having back pain. However he is afebrile was seen by physical therapy recommended home health. Which is currently being arranged.           Consults  cardiology  Significant Tests:  See full reports for all details     Dg Chest 2 View  Result Date: 09/20/2016 CLINICAL DATA:  Chest pain. EXAM: CHEST  2 VIEW COMPARISON:  03/13/2015 is FINDINGS: The cardiac silhouette, mediastinal and hilar contours are within  normal limits and stable. Stable surgical changes from bypass surgery. The lungs demonstrate mild chronic bronchitic changes but no infiltrates, edema or effusions. Stable biapical pleural and parenchymal scarring changes. Bilateral nipple shadows are noted. No significant bony findings. IMPRESSION: No acute cardiopulmonary findings. Electronically Signed   By: Marijo Sanes M.D.   On: 09/20/2016 14:20       Today   Subjective:   Bryan Scott  patient complains of feeling weak some pain in the back  Objective:   Blood pressure 123/67, pulse 68, temperature 98.3 F (36.8 C), resp. rate 18, height 6' (1.829 m), weight 220 lb (99.8 kg), SpO2 97 %.  . No intake or output data in the 24 hours ending 09/24/16 1253  Exam VITAL SIGNS: Blood pressure 123/67, pulse 68, temperature 98.3 F (36.8 C), resp. rate 18, height 6' (1.829 m), weight 220 lb (99.8 kg), SpO2 97 %.  GENERAL:  71 y.o.-year-old patient lying in the bed with no acute distress.  EYES: Pupils equal, round, reactive to light and accommodation. No scleral icterus. Extraocular muscles intact.  HEENT: Head atraumatic, normocephalic. Oropharynx and nasopharynx clear.  NECK:  Supple, no jugular venous distention. No thyroid enlargement, no tenderness.  LUNGS: Normal breath sounds bilaterally, no wheezing, rales,rhonchi or crepitation. No use of accessory muscles of respiration.  CARDIOVASCULAR: S1, S2 normal. No murmurs, rubs, or gallops.  ABDOMEN: Soft, nontender, nondistended. Bowel sounds present. No organomegaly or mass.  EXTREMITIES: No pedal edema, cyanosis, or clubbing.  NEUROLOGIC: Cranial nerves II through XII are intact. Muscle strength 5/5 in all  extremities. Sensation intact. Gait not checked.  PSYCHIATRIC: The patient is alert and oriented x 3.  SKIN: No obvious rash, lesion, or ulcer.   Data Review     CBC w Diff:  Lab Results  Component Value Date   WBC 6.9 09/21/2016   HGB 14.7 09/21/2016   HGB 14.9 10/24/2013    HCT 41.8 09/21/2016   HCT 44.7 10/24/2013   PLT 137 (L) 09/21/2016   PLT 212 10/24/2013   LYMPHOPCT 39.8 10/24/2013   MONOPCT 8.0 10/24/2013   EOSPCT 2.1 10/24/2013   BASOPCT 0.2 10/24/2013   CMP:  Lab Results  Component Value Date   NA 130 (L) 09/22/2016   NA 131 (L) 10/25/2013   K 3.5 09/22/2016   K 3.9 10/25/2013   CL 96 (L) 09/22/2016   CL 97 (L) 10/25/2013   CO2 27 09/22/2016   CO2 26 10/25/2013   BUN 29 (H) 09/22/2016   BUN 22 (H) 10/25/2013   CREATININE 1.37 (H) 09/22/2016   CREATININE 1.58 (H) 10/25/2013   PROT 6.6 07/31/2015   PROT 7.3 02/15/2013   ALBUMIN 3.8 07/31/2015   ALBUMIN 3.3 (L) 02/15/2013   BILITOT 1.1 07/31/2015   BILITOT 1.2 (H) 02/15/2013   ALKPHOS 45 07/31/2015   ALKPHOS 94 02/15/2013   AST 19 07/31/2015   AST 23 02/15/2013   ALT 21 07/31/2015   ALT 32 02/15/2013  .  Micro Results No results found for this or any previous visit (from the past 240 hour(s)).      Code Status Orders        Start     Ordered   09/20/16 1746  Full code  Continuous     09/20/16 1745    Code Status History    Date Active Date Inactive Code Status Order ID Comments User Context   07/31/2015  3:32 AM 08/01/2015  6:32 PM Full Code [id number]  Harrie Foreman, MD Inpatient   03/13/2015  2:51 AM 03/16/2015  5:26 PM Full Code [id number]  Lytle Butte, MD ED          Follow-up Information    DAVID BRONSTEIN, MD Follow up in 1 week(s).   Specialty:  Family Medicine Contact information: [address] [address] [phone]        [facility] Follow up.   Specialty:  Home Health Services Why:  Nurse, Physical therapy, Aide and social worker Contact information: [address] [place] [address] [phone]           Discharge Medications   Allergies as of 09/24/2016      Reactions   Aspergum [aspirin] Anaphylaxis   Heparin Other (See Comments)   Reaction:  Bleeding    Lisinopril Swelling       Medication List    STOP taking these medications   hydrochlorothiazide 25 MG tablet Commonly known as:  HYDRODIURIL   PRESCRIPTION MEDICATION     TAKE these medications   acetaminophen 325 MG tablet Commonly known as:  TYLENOL Take 975 mg by mouth 3 (three) times daily as needed for mild pain or fever.   alum & mag hydroxide-simeth 200-200-20 MG/5ML suspension Commonly known as:  MAALOX/MYLANTA Take 30 mLs by mouth every 6 (six) hours as needed for indigestion or heartburn.   amLODipine 5 MG tablet Commonly known as:  NORVASC Take 1 tablet (5 mg total) by mouth daily.   atorvastatin 10 MG tablet Commonly known as:  LIPITOR Take  1 tablet (10 mg total) by mouth daily.   carvedilol 12.5 MG tablet Commonly known as:  COREG Take 3 tablets (37.5 mg total) by mouth every 12 (twelve) hours.   guaiFENesin-dextromethorphan 100-10 MG/5ML syrup Commonly known as:  ROBITUSSIN DM Take 5 mLs by mouth every 4 (four) hours as needed for cough.   hydrALAZINE 25 MG tablet Commonly known as:  APRESOLINE Take 1 tablet (25 mg total) by mouth 3 (three) times daily. What changed:  when to take this   loperamide 2 MG capsule Commonly known as:  IMODIUM Take 1 capsule (2 mg total) by mouth every 6 (six) hours as needed for diarrhea or loose stools.   magnesium oxide 400 MG tablet Commonly known as:  MAG-OX Take 400 mg by mouth 2 (two) times daily.   morphine 15 MG 12 hr tablet Commonly known as:  MS CONTIN Take 15 mg by mouth every 8 (eight) hours.   naproxen 500 MG tablet Commonly known as:  NAPROSYN Take 1 tablet (500 mg total) by mouth 2 (two) times daily as needed.   ondansetron 8 MG tablet Commonly known as:  ZOFRAN Take 8 mg by mouth every 8 (eight) hours as needed for nausea or vomiting.   oseltamivir 75 MG capsule Commonly known as:  TAMIFLU Take 1 capsule (75 mg total) by mouth 2 (two) times daily.   oxyCODONE-acetaminophen 5-325 MG tablet Commonly known as:   PERCOCET Take 1 tablet by mouth every 8 (eight) hours as needed for severe pain.   pantoprazole 40 MG tablet Commonly known as:  PROTONIX Take 1 tablet (40 mg total) by mouth daily.   rivaroxaban 20 MG Tabs tablet Commonly known as:  XARELTO Take 1 tablet (20 mg total) by mouth daily with supper.   saccharomyces boulardii 250 MG capsule Commonly known as:  FLORASTOR Take 1 capsule (250 mg total) by mouth 2 (two) times daily.          Total Time in preparing paper work, data evaluation and todays exam - 35 minutes  Dustin Flock M.D on 09/24/2016 at 12:53 PM  [facility]   Office  [phone]

## 2016-09-24 NOTE — Care Management (Signed)
Patient is requesting to go to a skilled nursing facility.  Discussed that  physical therapy is not recommending skilled nursing placement. He then asks about assisted living.  Patient lives alone and "thinks I need a change."  discussed that medicare would not cover assisted living facility or skilled nursing if is not recommended and or medically necessary.  He initially was not agreeable to having home health physical therapy but has reconsider and will accept the service.  Agency preference is Nurse, learning disability.  Services needed- SN PT Aide and social work.  Have requested social work to complete an FL2 prior to discahrge to help facilitate assisted living placement.  Also requested PPD to be placed and to be read 48 -72 hours also to assist with placement in an assisted living facility.  Says that his sister will transport him home. Says he has access to a walker at home.  Will request home health agency to see patient either today (if discharges and home at a reasonable hour - or4.12.  Will have to have PPD read

## 2016-09-24 NOTE — Progress Notes (Signed)
Clinical Education officer, museum (CSW) received SNF consult. PT is recommending home health. Patient will D/C today to home. CSW completed FL2 so patient can be placed to ALF from home. RN case manager aware of above. Please reconsult if future social work needs arise. CSW signing off.   [employer], LCSW [phone]

## 2016-09-25 LAB — GLUCOSE, CAPILLARY: GLUCOSE-CAPILLARY: 203 mg/dL — AB (ref 65–99)

## 2016-09-26 ENCOUNTER — Inpatient Hospital Stay
Admission: EM | Admit: 2016-09-26 | Discharge: 2016-09-29 | DRG: 641 | Disposition: A | Discharge status: Skilled Nursing Facility | Payer: Medicare Other | Attending: Internal Medicine | Admitting: Internal Medicine

## 2016-09-26 ENCOUNTER — Encounter: Payer: Self-pay | Admitting: Emergency Medicine

## 2016-09-26 ENCOUNTER — Emergency Department: Payer: Medicare Other

## 2016-09-26 DIAGNOSIS — E119 Type 2 diabetes mellitus without complications: Secondary | ICD-10-CM | POA: Diagnosis present

## 2016-09-26 DIAGNOSIS — Z886 Allergy status to analgesic agent status: Secondary | ICD-10-CM | POA: Diagnosis not present

## 2016-09-26 DIAGNOSIS — Z7901 Long term (current) use of anticoagulants: Secondary | ICD-10-CM

## 2016-09-26 DIAGNOSIS — E785 Hyperlipidemia, unspecified: Secondary | ICD-10-CM | POA: Diagnosis present

## 2016-09-26 DIAGNOSIS — K219 Gastro-esophageal reflux disease without esophagitis: Secondary | ICD-10-CM | POA: Diagnosis present

## 2016-09-26 DIAGNOSIS — E876 Hypokalemia: Secondary | ICD-10-CM | POA: Diagnosis present

## 2016-09-26 DIAGNOSIS — E875 Hyperkalemia: Secondary | ICD-10-CM | POA: Diagnosis present

## 2016-09-26 DIAGNOSIS — I2510 Atherosclerotic heart disease of native coronary artery without angina pectoris: Secondary | ICD-10-CM | POA: Diagnosis present

## 2016-09-26 DIAGNOSIS — Z79891 Long term (current) use of opiate analgesic: Secondary | ICD-10-CM | POA: Diagnosis not present

## 2016-09-26 DIAGNOSIS — I10 Essential (primary) hypertension: Secondary | ICD-10-CM | POA: Diagnosis present

## 2016-09-26 DIAGNOSIS — Z87891 Personal history of nicotine dependence: Secondary | ICD-10-CM

## 2016-09-26 DIAGNOSIS — R112 Nausea with vomiting, unspecified: Secondary | ICD-10-CM | POA: Diagnosis present

## 2016-09-26 DIAGNOSIS — E669 Obesity, unspecified: Secondary | ICD-10-CM | POA: Diagnosis present

## 2016-09-26 DIAGNOSIS — I480 Paroxysmal atrial fibrillation: Secondary | ICD-10-CM | POA: Diagnosis present

## 2016-09-26 DIAGNOSIS — E871 Hypo-osmolality and hyponatremia: Secondary | ICD-10-CM | POA: Diagnosis present

## 2016-09-26 DIAGNOSIS — E860 Dehydration: Secondary | ICD-10-CM | POA: Diagnosis not present

## 2016-09-26 DIAGNOSIS — Z79899 Other long term (current) drug therapy: Secondary | ICD-10-CM

## 2016-09-26 DIAGNOSIS — E612 Magnesium deficiency: Secondary | ICD-10-CM | POA: Diagnosis present

## 2016-09-26 DIAGNOSIS — I255 Ischemic cardiomyopathy: Secondary | ICD-10-CM | POA: Diagnosis present

## 2016-09-26 DIAGNOSIS — Z951 Presence of aortocoronary bypass graft: Secondary | ICD-10-CM | POA: Diagnosis not present

## 2016-09-26 DIAGNOSIS — R55 Syncope and collapse: Secondary | ICD-10-CM | POA: Diagnosis present

## 2016-09-26 DIAGNOSIS — R0602 Shortness of breath: Secondary | ICD-10-CM

## 2016-09-26 DIAGNOSIS — Z6829 Body mass index (BMI) 29.0-29.9, adult: Secondary | ICD-10-CM | POA: Diagnosis not present

## 2016-09-26 DIAGNOSIS — J40 Bronchitis, not specified as acute or chronic: Secondary | ICD-10-CM | POA: Diagnosis present

## 2016-09-26 DIAGNOSIS — Z888 Allergy status to other drugs, medicaments and biological substances status: Secondary | ICD-10-CM

## 2016-09-26 LAB — CBC WITH DIFFERENTIAL/PLATELET
BASOS PCT: 0 %
Basophils Absolute: 0 10*3/uL (ref 0–0.1)
Eosinophils Absolute: 0 10*3/uL (ref 0–0.7)
Eosinophils Relative: 0 %
HCT: 40 % (ref 40.0–52.0)
HEMOGLOBIN: 14.3 g/dL (ref 13.0–18.0)
Lymphocytes Relative: 17 %
Lymphs Abs: 1.2 10*3/uL (ref 1.0–3.6)
MCH: 32.7 pg (ref 26.0–34.0)
MCHC: 35.7 g/dL (ref 32.0–36.0)
MCV: 91.6 fL (ref 80.0–100.0)
MONO ABS: 0.7 10*3/uL (ref 0.2–1.0)
MONOS PCT: 9 %
NEUTROS ABS: 5.1 10*3/uL (ref 1.4–6.5)
Neutrophils Relative %: 74 %
Platelets: 102 10*3/uL — ABNORMAL LOW (ref 150–440)
RBC: 4.36 MIL/uL — ABNORMAL LOW (ref 4.40–5.90)
RDW: 13.1 % (ref 11.5–14.5)
WBC: 7 10*3/uL (ref 3.8–10.6)

## 2016-09-26 LAB — BASIC METABOLIC PANEL
Anion gap: 9 (ref 5–15)
BUN: 19 mg/dL (ref 6–20)
CHLORIDE: 90 mmol/L — AB (ref 101–111)
CO2: 24 mmol/L (ref 22–32)
CREATININE: 1.11 mg/dL (ref 0.61–1.24)
Calcium: 8.5 mg/dL — ABNORMAL LOW (ref 8.9–10.3)
GFR calc non Af Amer: >60 mL/min (ref >60)
GLUCOSE: 239 mg/dL — AB (ref 65–99)
Potassium: 2.9 mmol/L — ABNORMAL LOW (ref 3.5–5.1)
Sodium: 123 mmol/L — ABNORMAL LOW (ref 135–145)

## 2016-09-26 LAB — PHOSPHORUS: Phosphorus: 2.8 mg/dL (ref 2.5–4.6)

## 2016-09-26 LAB — MAGNESIUM: MAGNESIUM: 1.5 mg/dL — AB (ref 1.7–2.4)

## 2016-09-26 LAB — GLUCOSE, CAPILLARY: GLUCOSE-CAPILLARY: 231 mg/dL — AB (ref 65–99)

## 2016-09-26 LAB — TROPONIN I: Troponin I: <0.03 ng/mL (ref <0.03)

## 2016-09-26 IMAGING — CR DG CHEST 2V
1 series · 3 of 3 positions shown · non-contrast
Comparison: Prior radiograph from [DATE].

CLINICAL DATA: Initial evaluation for acute shortness of breath
with productive cough, concern for pneumonia.

EXAM:
CHEST  2 VIEW

[Series 1: dg chest 2 view · 0.14mm/px · 3 of 3 slices shown]
[im 1/3]
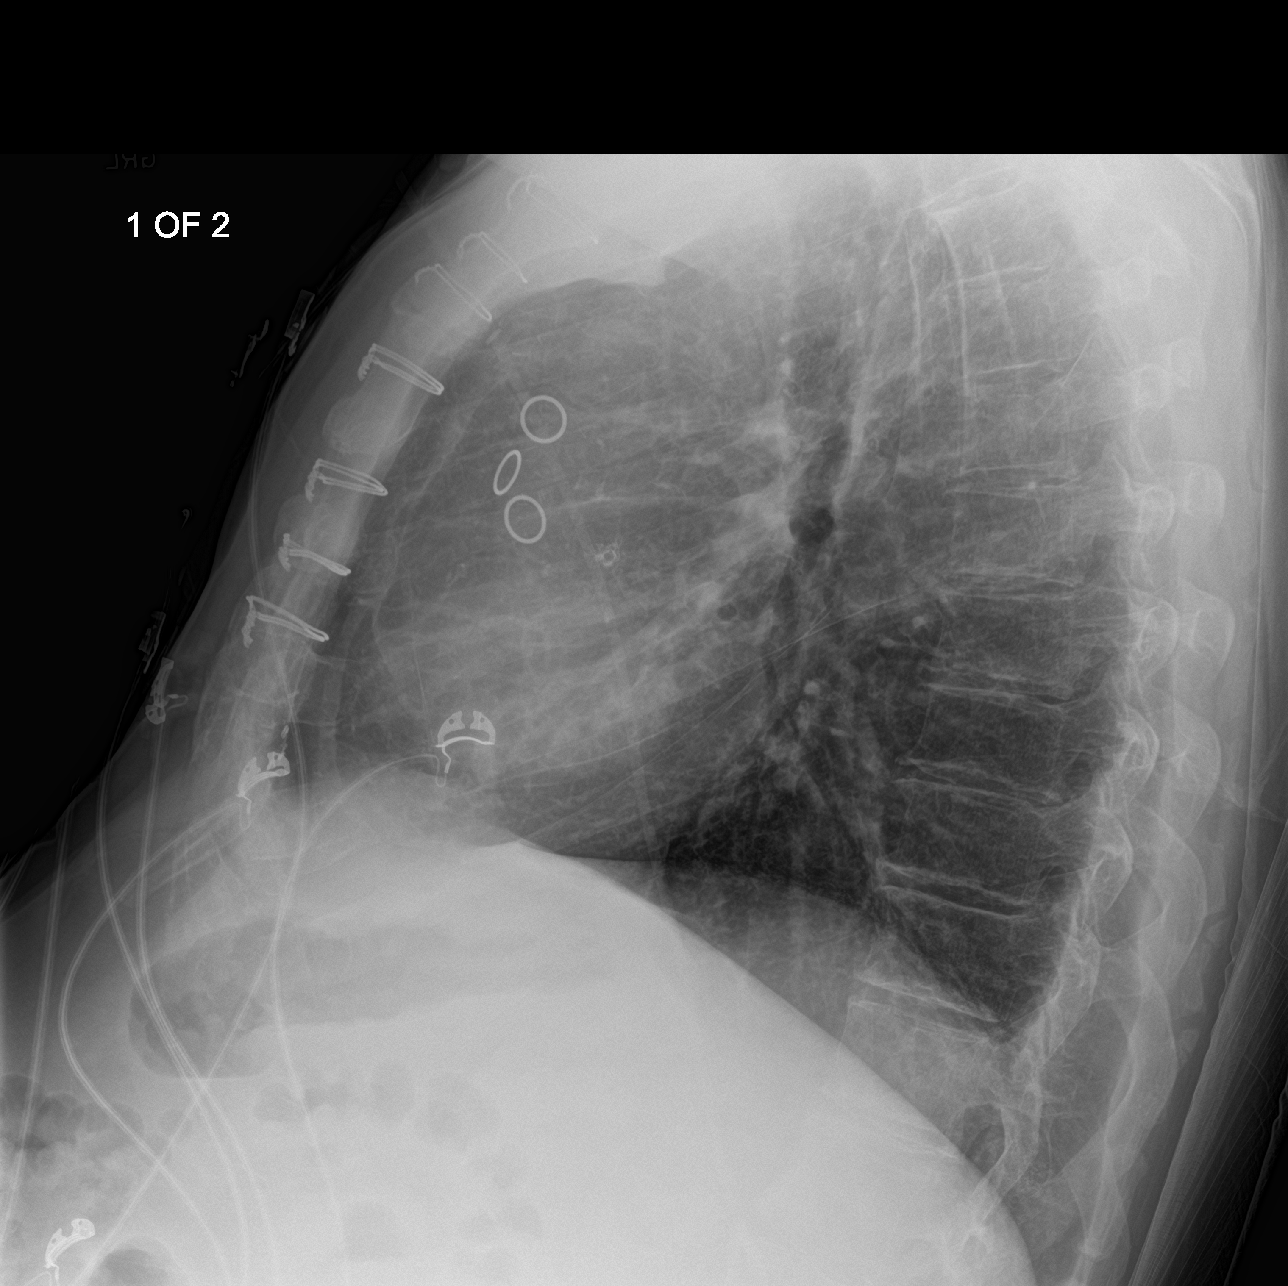
[im 2/3]
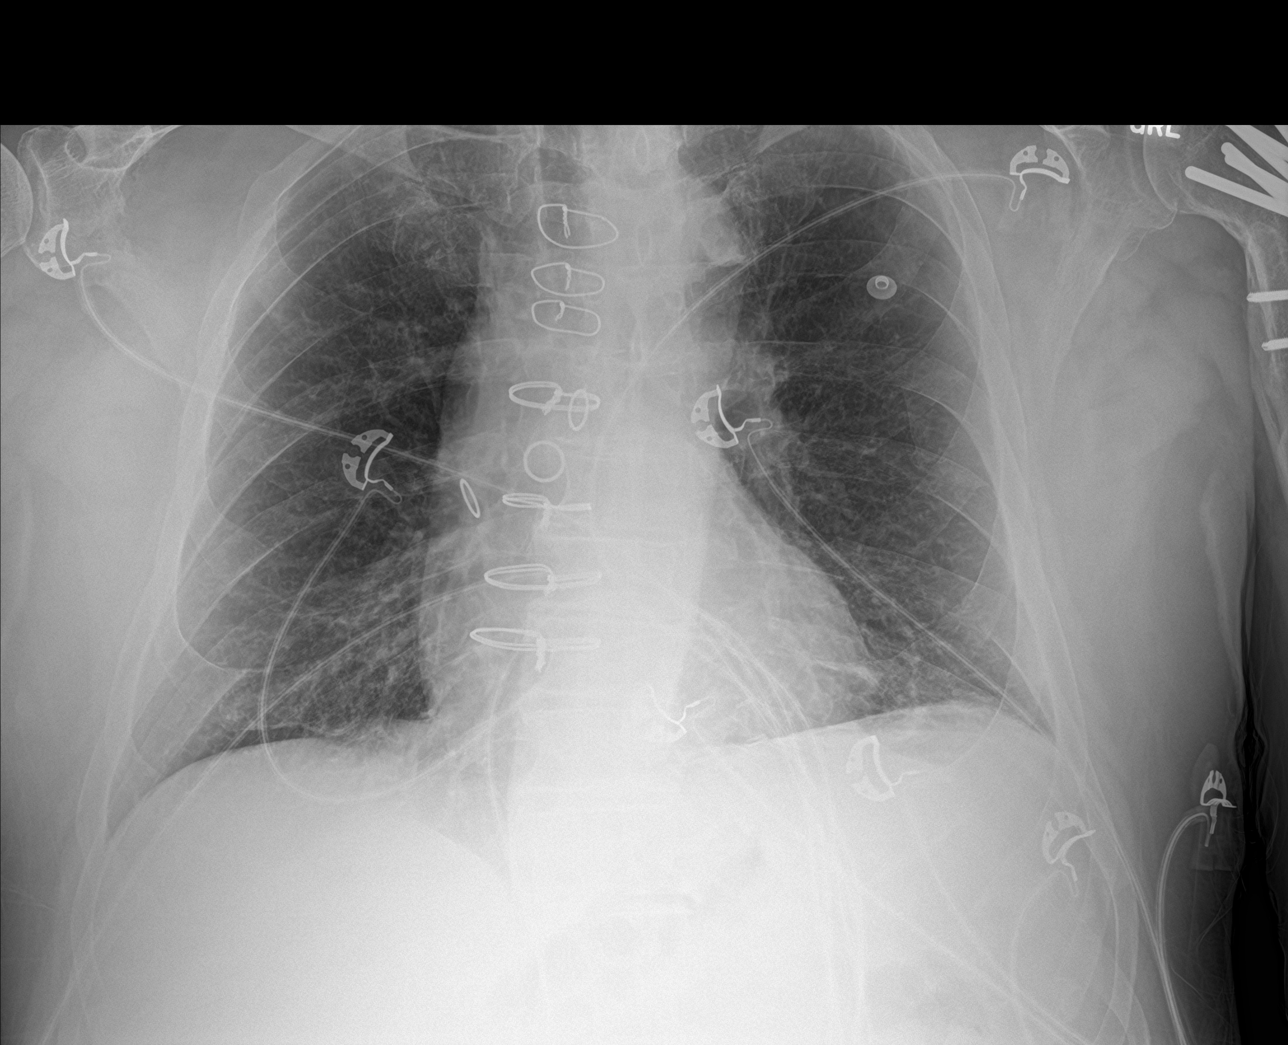
[im 3/3]
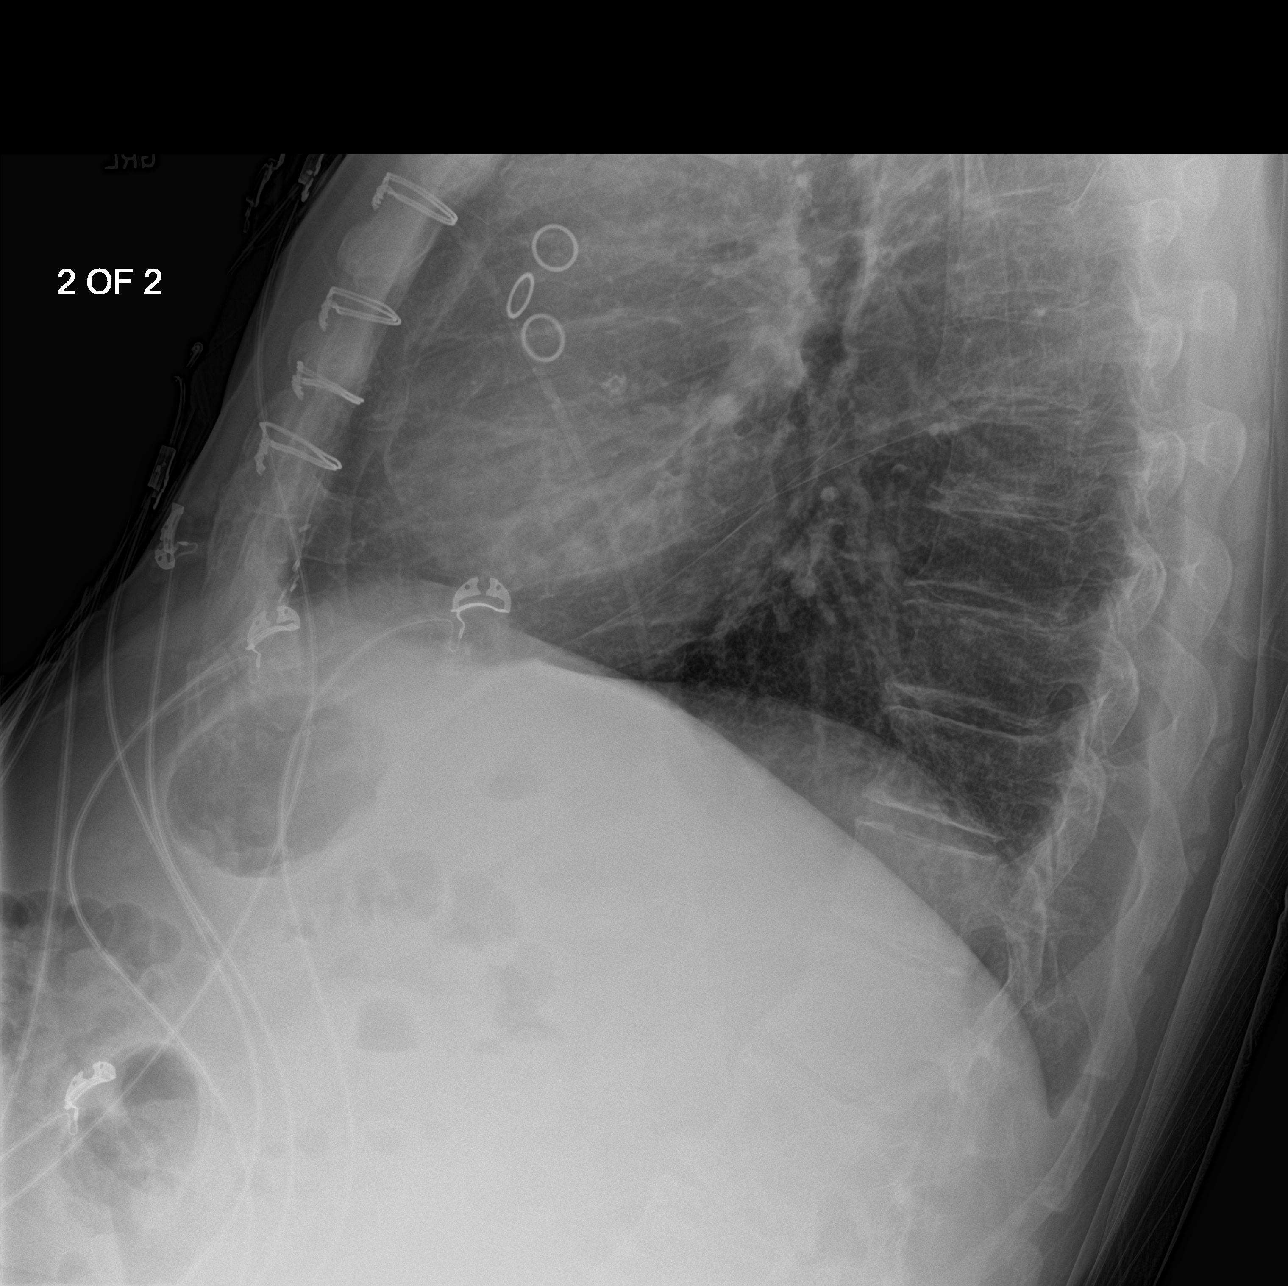

[3 of 3 positions shown; findings below may reference images not displayed]

FINDINGS: Median sternotomy wires underlying CABG markers again noted. Mild
cardiomegaly is stable. Mediastinal silhouette within normal limits.

Mild elevation the left hemidiaphragm. Few scattered linear
densities within the lung bases most consistent with subsegmental
atelectasis. No focal infiltrates or consolidative opacity to
suggest pneumonia. No pulmonary edema or pleural effusion. No
pneumothorax.

No acute osseous abnormality. Cervical ACDF and fixation hardware at
the left shoulder partially visualized.
IMPRESSION: 1. Mild bibasilar subsegmental atelectasis. Atelectasis. No findings
to suggest pneumonia.
2. No other radiographic evidence for active cardiopulmonary
disease.
3. Stable cardiomegaly with sequelae of prior CABG. No pulmonary
edema.

## 2016-09-26 MED ORDER — MAGNESIUM CITRATE PO SOLN: 1.0000 | Freq: Once | ORAL | Status: DC | PRN

## 2016-09-26 MED ORDER — POTASSIUM CHLORIDE CRYS ER 20 MEQ PO TBCR
40.0000 meq | EXTENDED_RELEASE_TABLET | Freq: Once | ORAL | Status: AC
  Administered 2016-09-26: 40 meq via ORAL
  Filled 2016-09-26: qty 2

## 2016-09-26 MED ORDER — IPRATROPIUM BROMIDE 0.02 % IN SOLN: 0.5000 mg | Freq: Four times a day (QID) | RESPIRATORY_TRACT | Status: DC | PRN

## 2016-09-26 MED ORDER — IPRATROPIUM-ALBUTEROL 0.5-2.5 (3) MG/3ML IN SOLN
3.0000 mL | Freq: Four times a day (QID) | RESPIRATORY_TRACT | Status: DC | PRN
  Administered 2016-09-27 – 2016-09-28 (×3): 3 mL via RESPIRATORY_TRACT
  Filled 2016-09-26 (×3): qty 3

## 2016-09-26 MED ORDER — ONDANSETRON HCL 4 MG PO TABS: 4.0000 mg | ORAL_TABLET | Freq: Four times a day (QID) | ORAL | Status: DC | PRN

## 2016-09-26 MED ORDER — ALBUTEROL SULFATE (2.5 MG/3ML) 0.083% IN NEBU: 2.5000 mg | INHALATION_SOLUTION | Freq: Four times a day (QID) | RESPIRATORY_TRACT | Status: DC | PRN

## 2016-09-26 MED ORDER — ONDANSETRON HCL 4 MG/2ML IJ SOLN
4.0000 mg | Freq: Four times a day (QID) | INTRAMUSCULAR | Status: DC | PRN
  Administered 2016-09-28 – 2016-09-29 (×4): 4 mg via INTRAVENOUS
  Filled 2016-09-26 (×4): qty 2

## 2016-09-26 MED ORDER — OSELTAMIVIR PHOSPHATE 75 MG PO CAPS
75.0000 mg | ORAL_CAPSULE | Freq: Two times a day (BID) | ORAL | Status: DC
  Administered 2016-09-26 – 2016-09-27 (×2): 75 mg via ORAL
  Filled 2016-09-26 (×2): qty 1

## 2016-09-26 MED ORDER — ALUM & MAG HYDROXIDE-SIMETH 200-200-20 MG/5ML PO SUSP: 30.0000 mL | Freq: Four times a day (QID) | ORAL | Status: DC | PRN

## 2016-09-26 MED ORDER — METHYLPREDNISOLONE SODIUM SUCC 125 MG IJ SOLR
60.0000 mg | Freq: Four times a day (QID) | INTRAMUSCULAR | Status: DC
  Administered 2016-09-26 – 2016-09-27 (×2): 60 mg via INTRAVENOUS
  Filled 2016-09-26 (×2): qty 2

## 2016-09-26 MED ORDER — MAGNESIUM OXIDE 400 (241.3 MG) MG PO TABS
400.0000 mg | ORAL_TABLET | Freq: Two times a day (BID) | ORAL | Status: DC
  Administered 2016-09-26 – 2016-09-29 (×6): 400 mg via ORAL
  Filled 2016-09-26 (×6): qty 1

## 2016-09-26 MED ORDER — ACETAMINOPHEN 325 MG PO TABS
975.0000 mg | ORAL_TABLET | Freq: Three times a day (TID) | ORAL | Status: DC | PRN
  Administered 2016-09-28 – 2016-09-29 (×2): 975 mg via ORAL
  Filled 2016-09-26 (×2): qty 3

## 2016-09-26 MED ORDER — IPRATROPIUM-ALBUTEROL 0.5-2.5 (3) MG/3ML IN SOLN
3.0000 mL | Freq: Once | RESPIRATORY_TRACT | Status: AC
  Administered 2016-09-26: 3 mL via RESPIRATORY_TRACT
  Filled 2016-09-26: qty 3

## 2016-09-26 MED ORDER — FENTANYL CITRATE (PF) 100 MCG/2ML IJ SOLN
25.0000 ug | Freq: Once | INTRAMUSCULAR | Status: AC
Start: 2016-09-26 — End: 2016-09-26
  Administered 2016-09-26: 25 ug via INTRAVENOUS
  Filled 2016-09-26: qty 2

## 2016-09-26 MED ORDER — FENTANYL CITRATE (PF) 100 MCG/2ML IJ SOLN
25.0000 ug | INTRAMUSCULAR | Status: DC | PRN
  Administered 2016-09-26: 25 ug via INTRAVENOUS
  Filled 2016-09-26: qty 2

## 2016-09-26 MED ORDER — HYDRALAZINE HCL 25 MG PO TABS
25.0000 mg | ORAL_TABLET | Freq: Two times a day (BID) | ORAL | Status: DC
  Administered 2016-09-26 – 2016-09-29 (×5): 25 mg via ORAL
  Filled 2016-09-26 (×6): qty 1

## 2016-09-26 MED ORDER — INSULIN ASPART 100 UNIT/ML ~~LOC~~ SOLN
0.0000 [IU] | Freq: Every day | SUBCUTANEOUS | Status: DC
Start: 2016-09-26 — End: 2016-09-29
  Administered 2016-09-27: 5 [IU] via SUBCUTANEOUS
  Administered 2016-09-27 – 2016-09-28 (×2): 2 [IU] via SUBCUTANEOUS
  Filled 2016-09-26 (×2): qty 2
  Filled 2016-09-26: qty 5

## 2016-09-26 MED ORDER — METHYLPREDNISOLONE SODIUM SUCC 125 MG IJ SOLR
60.0000 mg | Freq: Once | INTRAMUSCULAR | Status: AC
  Administered 2016-09-26: 60 mg via INTRAVENOUS
  Filled 2016-09-26: qty 2

## 2016-09-26 MED ORDER — SODIUM CHLORIDE 0.9% FLUSH
3.0000 mL | Freq: Two times a day (BID) | INTRAVENOUS | Status: DC
Start: 2016-09-26 — End: 2016-09-29
  Administered 2016-09-27 – 2016-09-29 (×5): 3 mL via INTRAVENOUS

## 2016-09-26 MED ORDER — LOPERAMIDE HCL 2 MG PO CAPS: 2.0000 mg | ORAL_CAPSULE | Freq: Four times a day (QID) | ORAL | Status: DC | PRN

## 2016-09-26 MED ORDER — ATORVASTATIN CALCIUM 10 MG PO TABS
10.0000 mg | ORAL_TABLET | Freq: Every day | ORAL | Status: DC
  Administered 2016-09-27 – 2016-09-28 (×2): 10 mg via ORAL
  Filled 2016-09-26 (×2): qty 1

## 2016-09-26 MED ORDER — MORPHINE SULFATE ER 15 MG PO TBCR
15.0000 mg | EXTENDED_RELEASE_TABLET | Freq: Three times a day (TID) | ORAL | Status: DC
  Administered 2016-09-26 – 2016-09-29 (×7): 15 mg via ORAL
  Filled 2016-09-26 (×7): qty 1

## 2016-09-26 MED ORDER — BISACODYL 5 MG PO TBEC
5.0000 mg | DELAYED_RELEASE_TABLET | Freq: Every day | ORAL | Status: DC | PRN
  Filled 2016-09-26: qty 1

## 2016-09-26 MED ORDER — GUAIFENESIN-DM 100-10 MG/5ML PO SYRP
5.0000 mL | ORAL_SOLUTION | ORAL | Status: DC | PRN
  Administered 2016-09-27 – 2016-09-29 (×4): 5 mL via ORAL
  Filled 2016-09-26 (×4): qty 5

## 2016-09-26 MED ORDER — SODIUM CHLORIDE 0.9 % IV BOLUS (SEPSIS)
1000.0000 mL | Freq: Once | INTRAVENOUS | Status: AC
  Administered 2016-09-26: 1000 mL via INTRAVENOUS

## 2016-09-26 MED ORDER — INSULIN ASPART 100 UNIT/ML ~~LOC~~ SOLN
0.0000 [IU] | Freq: Three times a day (TID) | SUBCUTANEOUS | Status: DC
  Administered 2016-09-27: 11 [IU] via SUBCUTANEOUS
  Administered 2016-09-27: 8 [IU] via SUBCUTANEOUS
  Administered 2016-09-27 – 2016-09-28 (×3): 15 [IU] via SUBCUTANEOUS
  Administered 2016-09-29: 8 [IU] via SUBCUTANEOUS
  Administered 2016-09-29: 5 [IU] via SUBCUTANEOUS
  Filled 2016-09-26: qty 15
  Filled 2016-09-26 (×2): qty 8
  Filled 2016-09-26: qty 5
  Filled 2016-09-26: qty 15

## 2016-09-26 MED ORDER — MAGNESIUM SULFATE 2 GM/50ML IV SOLN
2.0000 g | Freq: Once | INTRAVENOUS | Status: AC
  Administered 2016-09-26: 2 g via INTRAVENOUS
  Filled 2016-09-26: qty 50

## 2016-09-26 MED ORDER — AMLODIPINE BESYLATE 5 MG PO TABS
5.0000 mg | ORAL_TABLET | Freq: Every day | ORAL | Status: DC
  Administered 2016-09-28 – 2016-09-29 (×2): 5 mg via ORAL
  Filled 2016-09-26 (×3): qty 1

## 2016-09-26 MED ORDER — CARVEDILOL 12.5 MG PO TABS
37.5000 mg | ORAL_TABLET | Freq: Two times a day (BID) | ORAL | Status: DC
  Administered 2016-09-26 – 2016-09-29 (×5): 37.5 mg via ORAL
  Filled 2016-09-26 (×6): qty 1

## 2016-09-26 MED ORDER — ONDANSETRON HCL 4 MG PO TABS: 8.0000 mg | ORAL_TABLET | Freq: Three times a day (TID) | ORAL | Status: DC | PRN

## 2016-09-26 MED ORDER — POTASSIUM CHLORIDE IN NACL 40-0.9 MEQ/L-% IV SOLN
INTRAVENOUS | Status: DC
Start: 2016-09-26 — End: 2016-09-27
  Administered 2016-09-26: 75 mL/h via INTRAVENOUS
  Filled 2016-09-26 (×3): qty 1000

## 2016-09-26 MED ORDER — SENNOSIDES-DOCUSATE SODIUM 8.6-50 MG PO TABS: 1.0000 | ORAL_TABLET | Freq: Every evening | ORAL | Status: DC | PRN

## 2016-09-26 NOTE — H&P (Signed)
History and Physical   [facility] [place] [facility] Admission History and Physical [employer], D.O.    Patient Name: Bryan Scott MR#: [id number] Date of Birth: 12/30/1945 Date of Admission: 09/26/2016  Referring MD/NP/PA: Dr. Quentin Cornwall Primary Care Physician: [name], MD Patient coming from: Home Outpatient Specialists: Dr. Lucky Cowboy,  [facility]  Chief Complaint:  Chief Complaint  Patient presents with  . Shortness of Breath   HPI: Bryan Scott is a 71 y.o. male with a known history of CAD, GERD, HLD, HTN, ischemic cardiomyopathy, PAF, DM2 presents to the emergency department for evaluation of near syncope.  Patient was admitted to the hospital with flu and discharged home two days ago.  He has had nausea, vomiting, productive cough and near syncope today while vomiting.  Decreased PO intake.  Positive fevers/chills, weakness, dizziness, muscle aches, Denies chest pain, shortness of breath, abdominal pain.  Patient lives alone and is functionally independent.      EMS/ED Course: Patient received Fentanyl, Duoneb, magnesium, Solumedrol, NS.  Review of Systems:  CONSTITUTIONAL: Positive fever/chills, fatigue, weakness, Negative weight gain/loss, headache. EYES: No blurry or double vision. ENT: No tinnitus, postnasal drip, redness or soreness of the oropharynx. RESPIRATORY: No cough, dyspnea, wheeze.  No hemoptysis.  CARDIOVASCULAR: No chest pain, palpitations, syncope, orthopnea. No lower extremity edema.  GASTROINTESTINAL: Positive nausea, vomiting, abdominal pain, Negative diarrhea, constipation.  No hematemesis, melena or hematochezia. GENITOURINARY: No dysuria, frequency, hematuria. ENDOCRINE: No polyuria or nocturia. No heat or cold intolerance. HEMATOLOGY: No anemia, bruising, bleeding. INTEGUMENTARY: No rashes, ulcers, lesions. MUSCULOSKELETAL: No arthritis, gout, dyspnea. NEUROLOGIC: No numbness, tingling, ataxia, seizure-type activity,  weakness. PSYCHIATRIC: No anxiety, depression, insomnia.   Past Medical History:  Diagnosis Date  . 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 Cancer ([place])   . Carotid artery disease (HCC)    > 75% bilateral ICA stenoses on 01/2013 CT  . Chronic pain syndrome   . Coronary artery disease    s/p CABG ~ 2007  . GERD (gastroesophageal reflux disease)   . Heparin allergy    Bleeding  . History of tobacco use   . Hyperlipidemia   . Hypertension   . Ischemic cardiomyopathy   . Paroxysmal atrial fibrillation (HCC)    On Xarelto anticoagulation  . Type 2 diabetes mellitus (HCC)    A1C 6.8% in 01/2013    Past Surgical History:  Procedure Laterality Date  . CARDIAC CATHETERIZATION    . CORONARY ARTERY BYPASS GRAFT  2007     reports that he has quit smoking. His smoking use included Cigarettes. He has a 30.00 pack-year smoking history. He has never used smokeless tobacco. He reports that he does not drink alcohol or use drugs.  Allergies  Allergen Reactions  . 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 Heparin Other (See Comments)    Reaction:  Bleeding   . Lisinopril Swelling    Family History  Problem Relation Age of Onset  . Heart disease Mother 109    Deceased  . CVA Mother   . Seizures Father 30    Deceased from complications with seizures    Prior to Admission medications   Medication Sig Start Date End Date Taking? Authorizing Provider  amLODipine (NORVASC) 5 MG tablet Take 1 tablet (5 mg total) by mouth daily. 03/16/15  Yes Nicholes Mango, MD  atorvastatin (LIPITOR) 10 MG tablet Take 1 tablet (10 mg total) by mouth daily. 08/01/15  Yes Dustin Flock, MD  carvedilol (COREG) 12.5 MG  tablet Take 3 tablets (37.5 mg total) by mouth every 12 (twelve) hours. 03/16/15  Yes Nicholes Mango, MD  hydrALAZINE (APRESOLINE) 25 MG tablet Take 1 tablet (25 mg total) by mouth 3 (three) times daily. Patient taking differently: Take 25 mg by mouth 2 (two) times daily.  03/16/15  Yes Nicholes Mango, MD   magnesium oxide (MAG-OX) 400 MG tablet Take 400 mg by mouth 2 (two) times daily.    Yes Historical Provider, MD  morphine (MS CONTIN) 15 MG 12 hr tablet Take 15 mg by mouth every 8 (eight) hours.   Yes Historical Provider, MD  oseltamivir (TAMIFLU) 75 MG capsule Take 1 capsule (75 mg total) by mouth 2 (two) times daily. 09/24/16  Yes Dustin Flock, MD  rivaroxaban (XARELTO) 20 MG TABS tablet Take 1 tablet (20 mg total) by mouth daily with supper. 09/22/16  Yes Dustin Flock, MD  acetaminophen (TYLENOL) 325 MG tablet Take 975 mg by mouth 3 (three) times daily as needed for mild pain or fever.    Historical Provider, MD  alum & mag hydroxide-simeth (MAALOX/MYLANTA) 200-200-20 MG/5ML suspension Take 30 mLs by mouth every 6 (six) hours as needed for indigestion or heartburn. 03/16/15   Nicholes Mango, MD  guaiFENesin-dextromethorphan (ROBITUSSIN DM) 100-10 MG/5ML syrup Take 5 mLs by mouth every 4 (four) hours as needed for cough. 09/24/16   Dustin Flock, MD  loperamide (IMODIUM) 2 MG capsule Take 1 capsule (2 mg total) by mouth every 6 (six) hours as needed for diarrhea or loose stools. 09/24/16   Dustin Flock, MD  naproxen (NAPROSYN) 500 MG tablet Take 1 tablet (500 mg total) by mouth 2 (two) times daily as needed. Patient not taking: Reported on 09/20/2016 08/01/15   Dustin Flock, MD  ondansetron (ZOFRAN) 8 MG tablet Take 8 mg by mouth every 8 (eight) hours as needed for nausea or vomiting.     Historical Provider, MD  oxyCODONE-acetaminophen (PERCOCET) 5-325 MG tablet Take 1 tablet by mouth every 8 (eight) hours as needed for severe pain. Patient not taking: Reported on 09/20/2016 08/01/15   Dustin Flock, MD  pantoprazole (PROTONIX) 40 MG tablet Take 1 tablet (40 mg total) by mouth daily. Patient not taking: Reported on 09/20/2016 03/16/15   Nicholes Mango, MD  saccharomyces boulardii (FLORASTOR) 250 MG capsule Take 1 capsule (250 mg total) by mouth 2 (two) times daily. Patient not taking: Reported on 09/20/2016  03/16/15   Nicholes Mango, MD    Physical Exam: Vitals:   09/26/16 1921 09/26/16 1922  BP: (!) 163/87   Pulse: 62   Resp: (!) 26   Temp: 97.8 F (36.6 C)   TempSrc: Oral   SpO2: 93%   Weight:  99.8 kg (220 lb)  Height:  6' (1.829 m)    GENERAL: 71 y.o.-year-old male patient, ill-appearing lying in the bed in no acute distress.  Pleasant and cooperative.   HEENT: Head atraumatic, normocephalic. Pupils equal, round, reactive to light and accommodation. No scleral icterus. Extraocular muscles intact. Nares are patent. Oropharynx is clear. Mucus membranes moist. NECK: Supple, full range of motion. No JVD, no bruit heard. No thyroid enlargement, no tenderness, no cervical lymphadenopathy. CHEST: Normal breath sounds bilaterally. No wheezing, rales, rhonchi or crackles. No use of accessory muscles of respiration.  No reproducible chest wall tenderness.  CARDIOVASCULAR: S1, S2 normal. No murmurs, rubs, or gallops. Cap refill <2 seconds. Pulses intact distally.  ABDOMEN: Soft, nondistended, nontender. No rebound, guarding, rigidity. Normoactive bowel sounds present in all four quadrants. No  organomegaly or mass. EXTREMITIES: No pedal edema, cyanosis, or clubbing. No calf tenderness or Homan's sign.  NEUROLOGIC: The patient is alert and oriented x 3. Cranial nerves II through XII are grossly intact with no focal sensorimotor deficit. Muscle strength 5/5 in all extremities. Sensation intact. Gait not checked. PSYCHIATRIC:  Normal affect, mood, thought content. SKIN: Warm, dry, and intact without obvious rash, lesion, or ulcer.    Labs on Admission:  CBC:  Recent Labs Lab 09/20/16 1318 09/21/16 0138 09/26/16 1958  WBC 7.4 6.9 7.0  NEUTROABS  --   --  5.1  HGB 14.7 14.7 14.3  HCT 41.7 41.8 40.0  MCV 94.0 92.9 91.6  PLT 139* 137* 829*   Basic Metabolic Panel:  Recent Labs Lab 09/20/16 1318 09/21/16 0138 09/21/16 0904 09/22/16 0251 09/26/16 1958  NA 131* 130*  --  130* 123*  K  3.7 3.4*  --  3.5 2.9*  CL 98* 95*  --  96* 90*  CO2 24 22  --  27 24  GLUCOSE 276* 302*  --  364* 239*  BUN 15 17  --  29* 19  CREATININE 1.38* 1.34*  --  1.37* 1.11  CALCIUM 9.2 9.0  --  8.4* 8.5*  MG  --   --  1.6*  --  1.5*   GFR: Estimated Creatinine Clearance: 74.7 mL/min (by C-G formula based on SCr of 1.11 mg/dL). Liver Function Tests: No results for input(s): AST, ALT, ALKPHOS, BILITOT, PROT, ALBUMIN in the last 168 hours. No results for input(s): LIPASE, AMYLASE in the last 168 hours. No results for input(s): AMMONIA in the last 168 hours. Coagulation Profile: No results for input(s): INR, PROTIME in the last 168 hours. Cardiac Enzymes:  Recent Labs Lab 09/20/16 1318 09/20/16 1803 09/20/16 2141 09/21/16 0138 09/26/16 1958  TROPONINI <0.03 <0.03 <0.03 <0.03 <0.03   BNP (last 3 results) No results for input(s): PROBNP in the last 8760 hours. HbA1C: No results for input(s): HGBA1C in the last 72 hours. CBG:  Recent Labs Lab 09/23/16 1211 09/23/16 1705 09/23/16 2058 09/24/16 0742 09/24/16 1249  GLUCAP 207* 212* 172* 203* 195*   Lipid Profile: No results for input(s): CHOL, HDL, LDLCALC, TRIG, CHOLHDL, LDLDIRECT in the last 72 hours. Thyroid Function Tests: No results for input(s): TSH, T4TOTAL, FREET4, T3FREE, THYROIDAB in the last 72 hours. Anemia Panel: No results for input(s): VITAMINB12, FOLATE, FERRITIN, TIBC, IRON, RETICCTPCT in the last 72 hours. Urine analysis:    Component Value Date/Time   COLORURINE YELLOW (A) 09/22/2016 0800   APPEARANCEUR CLEAR (A) 09/22/2016 0800   APPEARANCEUR Clear 10/15/2013 2200   LABSPEC 1.017 09/22/2016 0800   LABSPEC 1.012 10/15/2013 2200   PHURINE 6.0 09/22/2016 0800   GLUCOSEU >=500 (A) 09/22/2016 0800   GLUCOSEU Negative 10/15/2013 2200   HGBUR SMALL (A) 09/22/2016 0800   BILIRUBINUR NEGATIVE 09/22/2016 0800   BILIRUBINUR Negative 10/15/2013 2200   KETONESUR 20 (A) 09/22/2016 0800   PROTEINUR 100 (A)  09/22/2016 0800   NITRITE NEGATIVE 09/22/2016 0800   LEUKOCYTESUR NEGATIVE 09/22/2016 0800   LEUKOCYTESUR Negative 10/15/2013 2200   Sepsis Labs: @LABRCNTIP (procalcitonin:4,lacticidven:4) )No results found for this or any previous visit (from the past 240 hour(s)).   Radiological Exams on Admission: Dg Chest 2 View  Result Date: 09/26/2016 CLINICAL DATA:  Initial evaluation for acute shortness of breath with productive cough, concern for pneumonia. EXAM: CHEST  2 VIEW COMPARISON:  Prior radiograph from 09/20/2016. FINDINGS: Median sternotomy wires underlying CABG markers again noted. Mild  cardiomegaly is stable. Mediastinal silhouette within normal limits. Mild elevation the left hemidiaphragm. Few scattered linear densities within the lung bases most consistent with subsegmental atelectasis. No focal infiltrates or consolidative opacity to suggest pneumonia. No pulmonary edema or pleural effusion. No pneumothorax. No acute osseous abnormality. Cervical ACDF and fixation hardware at the left shoulder partially visualized. IMPRESSION: 1. Mild bibasilar subsegmental atelectasis. Atelectasis. No findings to suggest pneumonia. 2. No other radiographic evidence for active cardiopulmonary disease. 3. Stable cardiomegaly with sequelae of prior CABG. No pulmonary edema. Electronically Signed   By: Jeannine Boga M.D.   On: 09/26/2016 20:27    EKG: Normal sinus rhythm at 62 bpm with normal axis and nonspecific ST-T wave changes.   Assessment/Plan  This is a 71 y.o. male with a history of CAD, GERD, HLD, HTN, ischemic cardiomyopathy, PAF, DM2 now being admitted with:  #. Near syncope while vomiting, likely secondary to dehydration - Admit observation with telemetry monitoring - IV fluid hydration - Check orthostatics  #. Hyponatremia - IV fluids - Recheck BMP in Am  #. Hypokalemia - IV replacement secondary to nausea  #. Bronchitis, recent Influenza B - Nebs, O2, expectorants -  Continue Tamiflu.   #. H/O Diabetes - Accuchecks achs with RISS coverage - Heart healthy, carb controlled diet  #. History of HLD - Continue Lipitor  #. History of HTN - Continue Norvasc, Coreg, hydralazine  #. History of H/o Diabetes - Accuchecks achs with RISS coverage - Heart healthy, carb controlled diet  Admission status: Inpatient IV Fluids: NS Diet/Nutrition: HH, CC Consults called: None  DVT Px:  SCDs and early ambulation.  Patient reports allergy to heparin and Lovenox which caused bleeding and refuses to take it.  Code Status: Full Code  Disposition Plan: To home in 1-2 days  All the records are reviewed and case discussed with ED provider. Management plans discussed with the patient and/or family who express understanding and agree with plan of care.  Zekiah Caruth D.O. on 09/26/2016 at 9:21 PM Between 7am to 6pm - Pager - [phone] After 6pm go to [email or web address] - [employer]reader [facility] Office [phone] CC: Primary care physician; [name], MD   09/26/2016, 9:21 PM

## 2016-09-26 NOTE — ED Triage Notes (Signed)
Patient from home via [facility]. Reports he was admitted to the hospital on Saturday for the flu and discharged Wednesday. Reports worsening SOB since yesterday. Reports congestion in chest with productive cough. Unsure of fever at home. Patient A&O x4.

## 2016-09-26 NOTE — ED Provider Notes (Signed)
[facility] Emergency Department Provider Note    First MD Initiated Contact with Patient 09/26/16 1919     (approximate)  I have reviewed the triage vital signs and the nursing notes.   HISTORY  Chief Complaint Shortness of Breath    HPI Bryan Scott is a 71 y.o. male with a history of CAD and recent admission to the hospital for the flu discharged on Wednesday presents with recurrent worsening shortness of breath since yesterday associated with congestive thick productive cough. No measured fevers at home but is very dyspneic upon arrival to the ER. No nausea or vomiting but has not been able to tolerate any oral hydration due to shortness of breath. Patient states he has remote history of smoking but has never been told that he has COPD.   Past Medical History:  Diagnosis Date  . 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 Cancer ([place])   . Carotid artery disease (HCC)    > 75% bilateral ICA stenoses on 01/2013 CT  . Chronic pain syndrome   . Coronary artery disease    s/p CABG ~ 2007  . GERD (gastroesophageal reflux disease)   . Heparin allergy    Bleeding  . History of tobacco use   . Hyperlipidemia   . Hypertension   . Ischemic cardiomyopathy   . Paroxysmal atrial fibrillation (HCC)    On Xarelto anticoagulation  . Type 2 diabetes mellitus (HCC)    A1C 6.8% in 01/2013   Family History  Problem Relation Age of Onset  . Heart disease Mother 70    Deceased  . CVA Mother   . Seizures Father 53    Deceased from complications with seizures   Past Surgical History:  Procedure Laterality Date  . CARDIAC CATHETERIZATION    . CORONARY ARTERY BYPASS GRAFT  2007   Patient Active Problem List   Diagnosis Date Noted  . Chest pain 09/22/2016  . 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  . Abnormal EKG 09/21/2016  . Chronic pain 09/21/2016  . PAF (paroxysmal atrial fibrillation) ([place]) 09/21/2016  . Essential hypertension  05/02/2016  . Carotid stenosis 05/02/2016  . CAD in native artery   . Atypical chest pain   . Uncontrolled type 2 diabetes mellitus with complication, with long-term current use of insulin ([place])   . Hyperlipidemia   . Pain of left upper extremity   . Angina pectoris ([place])   . Abdominal pain   . Chest pain, central 03/13/2015  . Hypertensive urgency 03/13/2015      Prior to Admission medications   Medication Sig Start Date End Date Taking? Authorizing Provider  amLODipine (NORVASC) 5 MG tablet Take 1 tablet (5 mg total) by mouth daily. 03/16/15  Yes Nicholes Mango, MD  atorvastatin (LIPITOR) 10 MG tablet Take 1 tablet (10 mg total) by mouth daily. 08/01/15  Yes Dustin Flock, MD  carvedilol (COREG) 12.5 MG tablet Take 3 tablets (37.5 mg total) by mouth every 12 (twelve) hours. 03/16/15  Yes Nicholes Mango, MD  hydrALAZINE (APRESOLINE) 25 MG tablet Take 1 tablet (25 mg total) by mouth 3 (three) times daily. Patient taking differently: Take 25 mg by mouth 2 (two) times daily.  03/16/15  Yes Nicholes Mango, MD  magnesium oxide (MAG-OX) 400 MG tablet Take 400 mg by mouth 2 (two) times daily.    Yes Historical Provider, MD  morphine (MS CONTIN) 15 MG 12 hr tablet Take 15 mg by mouth every 8 (  eight) hours.   Yes Historical Provider, MD  oseltamivir (TAMIFLU) 75 MG capsule Take 1 capsule (75 mg total) by mouth 2 (two) times daily. 09/24/16  Yes Dustin Flock, MD  rivaroxaban (XARELTO) 20 MG TABS tablet Take 1 tablet (20 mg total) by mouth daily with supper. 09/22/16  Yes Dustin Flock, MD  acetaminophen (TYLENOL) 325 MG tablet Take 975 mg by mouth 3 (three) times daily as needed for mild pain or fever.    Historical Provider, MD  alum & mag hydroxide-simeth (MAALOX/MYLANTA) 200-200-20 MG/5ML suspension Take 30 mLs by mouth every 6 (six) hours as needed for indigestion or heartburn. 03/16/15   Nicholes Mango, MD  guaiFENesin-dextromethorphan (ROBITUSSIN DM) 100-10 MG/5ML syrup Take 5 mLs by mouth every 4 (four)  hours as needed for cough. 09/24/16   Dustin Flock, MD  loperamide (IMODIUM) 2 MG capsule Take 1 capsule (2 mg total) by mouth every 6 (six) hours as needed for diarrhea or loose stools. 09/24/16   Dustin Flock, MD  naproxen (NAPROSYN) 500 MG tablet Take 1 tablet (500 mg total) by mouth 2 (two) times daily as needed. Patient not taking: Reported on 09/20/2016 08/01/15   Dustin Flock, MD  ondansetron (ZOFRAN) 8 MG tablet Take 8 mg by mouth every 8 (eight) hours as needed for nausea or vomiting.     Historical Provider, MD  oxyCODONE-acetaminophen (PERCOCET) 5-325 MG tablet Take 1 tablet by mouth every 8 (eight) hours as needed for severe pain. Patient not taking: Reported on 09/20/2016 08/01/15   Dustin Flock, MD  pantoprazole (PROTONIX) 40 MG tablet Take 1 tablet (40 mg total) by mouth daily. Patient not taking: Reported on 09/20/2016 03/16/15   Nicholes Mango, MD  saccharomyces boulardii (FLORASTOR) 250 MG capsule Take 1 capsule (250 mg total) by mouth 2 (two) times daily. Patient not taking: Reported on 09/20/2016 03/16/15   Nicholes Mango, MD    Allergies Aspergum [aspirin]; Heparin; and Lisinopril    Social History Social History  Substance Use Topics  . Smoking status: Former Smoker    Packs/day: 1.00    Years: 30.00    Types: Cigarettes  . Smokeless tobacco: Never Used     Comment: 30 pack-year history. Quit 8-9 years ago.   . Alcohol use No    Review of Systems Patient denies headaches, rhinorrhea, blurry vision, numbness, shortness of breath, chest pain, edema, cough, abdominal pain, nausea, vomiting, diarrhea, dysuria, fevers, rashes or hallucinations unless otherwise stated above in HPI. ____________________________________________   PHYSICAL EXAM:  VITAL SIGNS: Vitals:   09/26/16 1921 09/26/16 2030  BP: (!) 163/87 (!) 144/85  Pulse: 62 (!) 55  Resp: (!) 26 20  Temp: 97.8 F (36.6 C)     Constitutional: Alert and oriented.mod respiratory distress with tachypnea but  protecting his airway Eyes: Conjunctivae are normal. PERRL. EOMI. Head: Atraumatic. Nose: No congestion/rhinnorhea. Mouth/Throat: Mucous membranes are moist.  Oropharynx non-erythematous. Neck: No stridor. Painless ROM. No cervical spine tenderness to palpation Hematological/Lymphatic/Immunilogical: No cervical lymphadenopathy. Cardiovascular: Normal rate, regular rhythm. Grossly normal heart sounds.  Good peripheral circulation. Respiratory: tachypnea, use of accessory muscles, end expiratory wheeze Gastrointestinal: Soft and nontender. No distention. No abdominal bruits. No CVA tenderness. Musculoskeletal: No lower extremity tenderness nor edema.  No joint effusions. Neurologic:  Normal speech and language. No gross focal neurologic deficits are appreciated. No gait instability. Skin:  Skin is warm, dry and intact. No rash noted. Psychiatric: Mood and affect are normal. Speech and behavior are normal.  ____________________________________________   LABS (all  labs ordered are listed, but only abnormal results are displayed)  Results for orders placed or performed during the hospital encounter of 09/26/16 (from the past 24 hour(s))  CBC with Differential/Platelet     Status: Abnormal   Collection Time: 09/26/16  7:58 PM  Result Value Ref Range   WBC 7.0 3.8 - 10.6 K/uL   RBC 4.36 (L) 4.40 - 5.90 MIL/uL   Hemoglobin 14.3 13.0 - 18.0 g/dL   HCT 40.0 40.0 - 52.0 %   MCV 91.6 80.0 - 100.0 fL   MCH 32.7 26.0 - 34.0 pg   MCHC 35.7 32.0 - 36.0 g/dL   RDW 13.1 11.5 - 14.5 %   Platelets 102 (L) 150 - 440 K/uL   Neutrophils Relative % 74 %   Neutro Abs 5.1 1.4 - 6.5 K/uL   Lymphocytes Relative 17 %   Lymphs Abs 1.2 1.0 - 3.6 K/uL   Monocytes Relative 9 %   Monocytes Absolute 0.7 0.2 - 1.0 K/uL   Eosinophils Relative 0 %   Eosinophils Absolute 0.0 0 - 0.7 K/uL   Basophils Relative 0 %   Basophils Absolute 0.0 0 - 0.1 K/uL  Basic metabolic panel     Status: Abnormal   Collection Time:  09/26/16  7:58 PM  Result Value Ref Range   Sodium 123 (L) 135 - 145 mmol/L   Potassium 2.9 (L) 3.5 - 5.1 mmol/L   Chloride 90 (L) 101 - 111 mmol/L   CO2 24 22 - 32 mmol/L   Glucose, Bld 239 (H) 65 - 99 mg/dL   BUN 19 6 - 20 mg/dL   Creatinine, Ser 1.11 0.61 - 1.24 mg/dL   Calcium 8.5 (L) 8.9 - 10.3 mg/dL   GFR calc non Af Amer >60 >60 mL/min   GFR calc Af Amer >60 >60 mL/min   Anion gap 9 5 - 15  Troponin I     Status: None   Collection Time: 09/26/16  7:58 PM  Result Value Ref Range   Troponin I <0.03 <0.03 ng/mL  Magnesium     Status: Abnormal   Collection Time: 09/26/16  7:58 PM  Result Value Ref Range   Magnesium 1.5 (L) 1.7 - 2.4 mg/dL   ____________________________________________  EKG My review and personal interpretation at Time: 19:23   Indication: sob  Rate: 62  Rhythm: sinus Axis: normal Other: normal intervals,non specific st changes, no acute st elevations or depressions ____________________________________________  RADIOLOGY  I personally reviewed all radiographic images ordered to evaluate for the above acute complaints and reviewed radiology reports and findings.  These findings were personally discussed with the patient.  Please see medical record for radiology report.  ____________________________________________   PROCEDURES  Procedure(s) performed:  Procedures    Critical Care performed: yes CRITICAL CARE Performed by: Merlyn Lot   Total critical care time: 40 minutes  Critical care time was exclusive of separately billable procedures and treating other patients.  Critical care was necessary to treat or prevent imminent or life-threatening deterioration.  Critical care was time spent personally by me on the following activities: development of treatment plan with patient and/or surrogate as well as nursing, discussions with consultants, evaluation of patient's response to treatment, examination of patient, obtaining history from  patient or surrogate, ordering and performing treatments and interventions, ordering and review of laboratory studies, ordering and review of radiographic studies, pulse oximetry and re-evaluation of patient's condition.  ____________________________________________   INITIAL IMPRESSION / ASSESSMENT AND PLAN / ED COURSE  Pertinent labs & imaging results that were available during my care of the patient were reviewed by me and considered in my medical decision making (see chart for details).  DDX: Asthma, copd, CHF, pna, ptx, malignancy, Pe, anemia   ZYIRE EIDSON is a 71 y.o. who presents to the ED with weakness, cough, shortness of breath and nausea vomiting as described above. Chest x-ray shows no evidence of infiltrate. Patient's abdominal exam is soft and benign. Does have moderate respiratory distress therefore nebulizer treatments ordered with some improvement in his aeration. Oropharynx is clear. No stridor. Blood work was ordered to evaluate for any acute abnormality and shows evidence of acute hyponatremia with acute hyperkalemia as well as hypomagnesemia. In the setting of his nausea vomiting and recent diagnosis of influenza patient was started on IV fluids and given IV magnesium with by mouth potassium. Respiratory effort is improving after multiple nebulizer treatments. We'll give IV steroids. As he does not have any fever or leukocytosis a do not feel that antibiotics clinically indicated as there is no evidence of pneumonia with this presentation.  Have discussed with the patient and available family all diagnostics and treatments performed thus far and all questions were answered to the best of my ability. The patient demonstrates understanding and agreement with plan.       ____________________________________________   FINAL CLINICAL IMPRESSION(S) / ED DIAGNOSES  Final diagnoses:  Shortness of breath  Bronchitis  Hyponatremia  Hypokalemia  Magnesium deficiency       NEW MEDICATIONS STARTED DURING THIS VISIT:  New Prescriptions   No medications on file     Note:  This document was prepared using Dragon voice recognition software and may include unintentional dictation errors.    Merlyn Lot, MD 09/26/16 2140

## 2016-09-26 NOTE — Care Management (Signed)
Informed by Bryan Scott that agency made multiple attempts to open him to care for nursing, physical therapy and social worker.  Patient declined the service which he had told CM that he desperately needed

## 2016-09-27 LAB — COMPREHENSIVE METABOLIC PANEL
ALBUMIN: 3 g/dL — AB (ref 3.5–5.0)
ALT: 46 U/L (ref 17–63)
AST: 48 U/L — AB (ref 15–41)
Alkaline Phosphatase: 55 U/L (ref 38–126)
Anion gap: 12 (ref 5–15)
BUN: 21 mg/dL — ABNORMAL HIGH (ref 6–20)
CHLORIDE: 100 mmol/L — AB (ref 101–111)
CO2: 20 mmol/L — ABNORMAL LOW (ref 22–32)
Calcium: 8.7 mg/dL — ABNORMAL LOW (ref 8.9–10.3)
Creatinine, Ser: 1.07 mg/dL (ref 0.61–1.24)
GFR calc Af Amer: >60 mL/min (ref >60)
GLUCOSE: 281 mg/dL — AB (ref 65–99)
POTASSIUM: 4.4 mmol/L (ref 3.5–5.1)
Sodium: 132 mmol/L — ABNORMAL LOW (ref 135–145)
Total Bilirubin: 1.4 mg/dL — ABNORMAL HIGH (ref 0.3–1.2)
Total Protein: 6.8 g/dL (ref 6.5–8.1)

## 2016-09-27 LAB — CBC
HCT: 41.2 % (ref 40.0–52.0)
Hemoglobin: 14.7 g/dL (ref 13.0–18.0)
MCH: 32.1 pg (ref 26.0–34.0)
MCHC: 35.5 g/dL (ref 32.0–36.0)
MCV: 90.4 fL (ref 80.0–100.0)
PLATELETS: 95 10*3/uL — AB (ref 150–440)
RBC: 4.56 MIL/uL (ref 4.40–5.90)
RDW: 13.3 % (ref 11.5–14.5)
WBC: 3.8 10*3/uL (ref 3.8–10.6)

## 2016-09-27 LAB — GLUCOSE, CAPILLARY
GLUCOSE-CAPILLARY: 295 mg/dL — AB (ref 65–99)
GLUCOSE-CAPILLARY: 377 mg/dL — AB (ref 65–99)
Glucose-Capillary: 302 mg/dL — ABNORMAL HIGH (ref 65–99)
Glucose-Capillary: 414 mg/dL — ABNORMAL HIGH (ref 65–99)

## 2016-09-27 LAB — MAGNESIUM: MAGNESIUM: 2.1 mg/dL (ref 1.7–2.4)

## 2016-09-27 MED ORDER — PREDNISONE 50 MG PO TABS
50.0000 mg | ORAL_TABLET | Freq: Every day | ORAL | Status: DC
Start: 2016-09-28 — End: 2016-09-28

## 2016-09-27 MED ORDER — MENTHOL 3 MG MT LOZG
1.0000 | LOZENGE | OROMUCOSAL | Status: DC | PRN
  Filled 2016-09-27: qty 9

## 2016-09-27 NOTE — Progress Notes (Signed)
Pt appetite improved, ate chicken noodle soup, encouraged PO fluid intake. Pt reports cepacol lozenges are helping with sore throat. Pt was able to get up to chair with PT, however complains that it hurts his back so prefers to sit on side of bed.

## 2016-09-27 NOTE — Evaluation (Signed)
Physical Therapy Evaluation Patient Details Name: Bryan Scott MRN: [id number] DOB: 1945/08/26 Today's Date: 09/27/2016   History of Present Illness  Pt is a 70 y/o who presented to the ED after experiencing nausea, vomiting, productive cough, and near syncope when vomiting.  Pt was recently admitted to the hospital with flu and d/c home on 4/11.  Pt's PMH includes cancer, chronic pain syndrome, ischemic cardiomyopathy, paroxysmal a-fib, CABG.    Clinical Impression  Pt admitted with above diagnosis. Pt currently with functional limitations due to the deficits listed below (see PT Problem List). Bryan Scott reported generalized weakness and fatigue but was willing to work with therapy.  He requires min assist to steady with sit>stand transfers and with short distance ambulation in room without AD.  Stability improved with introduction of RW but pt limited by fatigue.  Given pt's current mobility status, recommending SNF at d/c.  Pt will benefit from skilled PT to increase their independence and safety with mobility to allow discharge to the venue listed below.      Follow Up Recommendations SNF    Equipment Recommendations  None recommended by PT    Recommendations for Other Services       Precautions / Restrictions Precautions Precautions: Fall;Other (comment) Precaution Comments: pt recently had flu Restrictions Weight Bearing Restrictions: No      Mobility  Bed Mobility Overal bed mobility: Needs Assistance Bed Mobility: Supine to Sit     Supine to sit: Min assist;HOB elevated     General bed mobility comments: Pt encouraged to attempt supine>sit without assist of therapist.  He attempts to but requries min assist to elevate trunk.  To return to supine pt requires increased time and effort.  Transfers Overall transfer level: Needs assistance Equipment used: None Transfers: Sit to/from Stand Sit to Stand: Min assist         General transfer comment: Pt unsteady and  requires min assist with sit<>stand.  Close min guard assit for stand>sit.  Ambulation/Gait Ambulation/Gait assistance: Min assist;Min guard Ambulation Distance (Feet): 70 Feet Assistive device: Rolling walker (2 wheeled);None Gait Pattern/deviations: Step-through pattern;Decreased stride length;Trunk flexed Gait velocity: decreased Gait velocity interpretation: Below normal speed for age/gender General Gait Details: Pt ambulates in to doorway without AD and requires up to min assist due to instability.  Once RW introduced pt requires close min guard assist due to fatigue.  Pt requires ~5 standing rest breaks due to fatigue.  Stairs            Wheelchair Mobility    Modified Rankin (Stroke Patients Only)       Balance Overall balance assessment: Needs assistance;History of Falls Sitting-balance support: No upper extremity supported;Feet supported Sitting balance-Leahy Scale: Good     Standing balance support: No upper extremity supported;During functional activity Standing balance-Leahy Scale: Fair Standing balance comment: Pt able to stand statically without UE support but requires UE support for dynamic activities                             Pertinent Vitals/Pain Pain Assessment: 0-10 Pain Score: 8  Pain Location: "all over" Pain Descriptors / Indicators: Aching Pain Intervention(s): Limited activity within patient's tolerance;Monitored during session    [place] expects to be discharged to:: Private residence Living Arrangements: Alone Available Help at Discharge: Family;Available PRN/intermittently Type of Home: Mobile home Home Access: Stairs to enter Entrance Stairs-Rails: Left;Right Entrance Stairs-Number of Steps: 4 Home Layout: One level  Home Equipment: Walker - 4 wheels      Prior Function Level of Independence: Independent with assistive device(s)         Comments: Pt reports he gets out to run errands regularly,  uses rollator outside the home.  Pt reports he fell 4/13 after vomiting.     Hand Dominance   Dominant Hand: Right    Extremity/Trunk Assessment   Upper Extremity Assessment Upper Extremity Assessment: Generalized weakness    Lower Extremity Assessment Lower Extremity Assessment: Generalized weakness (BLE strength grossly at least 3/5)       Communication   Communication: Other (comment);No difficulties (pt reports his throat hurts when he talks)  Cognition Arousal/Alertness: Awake/alert Behavior During Therapy: WFL for tasks assessed/performed Overall Cognitive Status: Within Functional Limits for tasks assessed                                        General Comments      Exercises     Assessment/Plan    PT Assessment Patient needs continued PT services  PT Problem List Decreased strength;Decreased balance;Decreased activity tolerance;Decreased knowledge of use of DME;Decreased safety awareness;Pain       PT Treatment Interventions DME instruction;Gait training;Stair training;Functional mobility training;Therapeutic activities;Therapeutic exercise;Balance training;Patient/family education    PT Goals (Current goals can be found in the Care Plan section)  Acute Rehab PT Goals Patient Stated Goal: to feel better PT Goal Formulation: With patient Time For Goal Achievement: 10/11/16 Potential to Achieve Goals: Fair    Frequency Min 2X/week   Barriers to discharge Decreased caregiver support Lives alone    Co-evaluation               End of Session Equipment Utilized During Treatment: Gait belt Activity Tolerance: Patient limited by fatigue Patient left: in bed;with call bell/phone within reach;with bed alarm set;with SCD's reapplied (refused chair, reports it causes him back pain) Nurse Communication: Mobility status PT Visit Diagnosis: Unsteadiness on feet (R26.81);Muscle weakness (generalized) (M62.81)    Time: 4401-0272 PT Time  Calculation (min) (ACUTE ONLY): 25 min   Charges:   PT Evaluation $PT Eval Low Complexity: 1 Procedure PT Treatments $Gait Training: 8-22 mins   PT G Codes:        Bryan Scott PT, DPT 09/27/2016, 12:18 PM

## 2016-09-27 NOTE — Progress Notes (Signed)
MD notified of pt fsbs of 414. MD stated to give sliding scale coverage of 5 units.

## 2016-09-27 NOTE — Progress Notes (Signed)
[facility] [place] NAME: Bryan Scott    MR#:  [id number]  DATE OF BIRTH:  05/08/1946  SUBJECTIVE:  Patient came in with generalized weakness not able to eat nausea and vomiting. He was recently admitted and discharged on 09/24/2016 after diagnosed with flu.  REVIEW OF SYSTEMS:   Review of Systems  Constitutional: Negative for chills, fever and weight loss.  HENT: Negative for ear discharge, ear pain and nosebleeds.   Eyes: Negative for blurred vision, pain and discharge.  Respiratory: Negative for sputum production, shortness of breath, wheezing and stridor.   Cardiovascular: Negative for chest pain, palpitations, orthopnea and PND.  Gastrointestinal: Positive for nausea and vomiting. Negative for abdominal pain and diarrhea.  Genitourinary: Negative for frequency and urgency.  Musculoskeletal: Positive for back pain. Negative for joint pain.  Neurological: Positive for weakness. Negative for sensory change, speech change and focal weakness.  Psychiatric/Behavioral: Negative for depression and hallucinations. The patient is not nervous/anxious.    Tolerating Diet:some Tolerating PT: Recommends rehabilitation  DRUG ALLERGIES:   Allergies  Allergen Reactions  . 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 Heparin Other (See Comments)    Reaction:  Bleeding   . Lisinopril Swelling    VITALS:  Blood pressure (!) 118/59, pulse (!) 53, temperature 97.7 F (36.5 C), temperature source Oral, resp. rate 14, height 6' (1.829 m), weight 99.4 kg (219 lb 1.6 oz), SpO2 96 %.  PHYSICAL EXAMINATION:   Physical Exam  GENERAL:  71 y.o.-year-old patient lying in the bed with no acute distress. Obese. Appears chronically ill appearing EYES: Pupils equal, round, reactive to light and accommodation. No scleral icterus. Extraocular muscles intact.  HEENT: Head atraumatic, normocephalic. Oropharynx and nasopharynx clear.  NECK:  Supple, no jugular venous  distention. No thyroid enlargement, no tenderness.  LUNGS: Normal breath sounds bilaterally, no wheezing, rales, rhonchi. No use of accessory muscles of respiration.  CARDIOVASCULAR: S1, S2 normal. No murmurs, rubs, or gallops.  ABDOMEN: Soft, nontender, nondistended. Bowel sounds present. No organomegaly or mass.  EXTREMITIES: No cyanosis, clubbing or edema b/l.    NEUROLOGIC: Cranial nerves II through XII are intact. No focal Motor or sensory deficits b/l.   PSYCHIATRIC:  patient is alert and oriented x 3.  SKIN: No obvious rash, lesion, or ulcer.   LABORATORY PANEL:  CBC  Recent Labs Lab 09/27/16 0448  WBC 3.8  HGB 14.7  HCT 41.2  PLT 95*    Chemistries   Recent Labs Lab 09/27/16 0448  NA 132*  K 4.4  CL 100*  CO2 20*  GLUCOSE 281*  BUN 21*  CREATININE 1.07  CALCIUM 8.7*  MG 2.1  AST 48*  ALT 46  ALKPHOS 55  BILITOT 1.4*   Cardiac Enzymes  Recent Labs Lab 09/26/16 1958  TROPONINI <0.03   RADIOLOGY:  Dg Chest 2 View  Result Date: 09/26/2016 CLINICAL DATA:  Initial evaluation for acute shortness of breath with productive cough, concern for pneumonia. EXAM: CHEST  2 VIEW COMPARISON:  Prior radiograph from 09/20/2016. FINDINGS: Median sternotomy wires underlying CABG markers again noted. Mild cardiomegaly is stable. Mediastinal silhouette within normal limits. Mild elevation the left hemidiaphragm. Few scattered linear densities within the lung bases most consistent with subsegmental atelectasis. No focal infiltrates or consolidative opacity to suggest pneumonia. No pulmonary edema or pleural effusion. No pneumothorax. No acute osseous abnormality. Cervical ACDF and fixation hardware at the left shoulder partially visualized. IMPRESSION: 1. Mild bibasilar subsegmental atelectasis. Atelectasis. No findings  to suggest pneumonia. 2. No other radiographic evidence for active cardiopulmonary disease. 3. Stable cardiomegaly with sequelae of prior CABG. No pulmonary edema.  Electronically Signed   By: Jeannine Boga M.D.   On: 09/26/2016 20:27   ASSESSMENT AND PLAN:   71 y.o. male with a history of CAD, GERD, HLD, HTN, ischemic cardiomyopathy, PAF, DM2 now being admitted with:  #. Near syncope while vomiting, likely secondary to dehydration - IV fluid hydration   #. Hyponatremia - IV fluids -123---132  #. Hypokalemia - IV replacement secondary to nausea  #. Bronchitis, recent Influenza B - Nebs, O2, expectorants -  Tamiflu.   #. H/O Diabetes - Accuchecks achs with RISS coverage - Heart healthy, carb controlled diet  #. History of HLD - Continue Lipitor  #. History of HTN - Continue Norvasc, Coreg, hydralazine  #. History of H/o Diabetes - Accuchecks achs with RISS coverage - Heart healthy, carb controlled diet  Physical therapy recommends rehabilitation social worker consult placed Case discussed with Care Management/Social Worker. Management plans discussed with the patient, family and they are in agreement.  CODE STATUS: Full  DVT Prophylaxis: Lovenox TOTAL TIME TAKING CARE OF THIS PATIENT: 30 minutes.  >50% time spent on counselling and coordination of care  POSSIBLE D/C IN *1-2 DAYS, DEPENDING ON CLINICAL CONDITION.  Note: This dictation was prepared with Dragon dictation along with smaller phrase technology. Any transcriptional errors that result from this process are unintentional.  Raylee Adamec M.D on 09/27/2016 at 1:37 PM  Between 7am to 6pm - Pager - [phone]  After 6pm go to [email or web address] - password [email or web address] [facility]  Office  [phone]  CC: Primary care physician; [name], MD

## 2016-09-28 LAB — GLUCOSE, CAPILLARY
Glucose-Capillary: 360 mg/dL — ABNORMAL HIGH (ref 65–99)
Glucose-Capillary: 377 mg/dL — ABNORMAL HIGH (ref 65–99)
Glucose-Capillary: 125 mg/dL — ABNORMAL HIGH (ref 65–99)
Glucose-Capillary: 215 mg/dL — ABNORMAL HIGH (ref 65–99)

## 2016-09-28 MED ORDER — GUAIFENESIN ER 600 MG PO TB12
600.0000 mg | ORAL_TABLET | Freq: Two times a day (BID) | ORAL | Status: DC
  Administered 2016-09-28 – 2016-09-29 (×3): 600 mg via ORAL
  Filled 2016-09-28 (×3): qty 1

## 2016-09-28 MED ORDER — METFORMIN HCL 500 MG PO TABS
500.0000 mg | ORAL_TABLET | Freq: Two times a day (BID) | ORAL | Status: DC
  Administered 2016-09-28 – 2016-09-29 (×3): 500 mg via ORAL
  Filled 2016-09-28 (×3): qty 1

## 2016-09-28 NOTE — Progress Notes (Signed)
MD notified of pt HR 57-60 and pm dose of Coreg due. MD stated ok to give. Will cont to monitor

## 2016-09-28 NOTE — Clinical Social Work Placement (Signed)
   CLINICAL SOCIAL WORK PLACEMENT  NOTE  Date:  09/28/2016  Patient Details  Name: Bryan Scott MRN: [id number] Date of Birth: 1945-06-17  Clinical Social Work is seeking post-discharge placement for this patient at the [place] level of care (*CSW will initial, date and re-position this form in  chart as items are completed):  Yes   Patient/family provided with [place] Work Department's list of facilities offering this level of care within the geographic area requested by the patient (or if unable, by the patient's family).  Yes   Patient/family informed of their freedom to choose among providers that offer the needed level of care, that participate in Medicare, Medicaid or managed care program needed by the patient, have an available bed and are willing to accept the patient.  Yes   Patient/family informed of [facility]'s ownership interest in [facility] and [facility], as well as of the fact that they are under no obligation to receive care at these facilities.  PASRR submitted to EDS on 09/28/16     PASRR number received on       Existing PASRR number confirmed on 09/28/16     FL2 transmitted to all facilities in geographic area requested by pt/family on 09/28/16     FL2 transmitted to all facilities within larger geographic area on       Patient informed that his/her managed care company has contracts with or will negotiate with certain facilities, including the following:            Patient/family informed of bed offers received.  Patient chooses bed at       Physician recommends and patient chooses bed at      Patient to be transferred to   on  .  Patient to be transferred to facility by       Patient family notified on   of transfer.  Name of family member notified:        PHYSICIAN       Additional Comment:    _______________________________________________ Zettie Pho, LCSW 09/28/2016, 2:06 PM

## 2016-09-28 NOTE — NC FL2 (Signed)
[place] LEVEL OF CARE SCREENING TOOL     IDENTIFICATION  Patient Name: Bryan Scott Birthdate: February 09, 1946 Sex: male Admission Date (Current Location): 09/26/2016  [place] and [place] Number:  Engineering geologist and Address:  [facility], [address] [place], [place] [address]      Provider Number: [id number]  Attending Physician Name and Address:  Fritzi Mandes, MD  Relative Name and Phone Number:       Current Level of Care: Hospital Recommended Level of Care: [place] Prior Approval Number:    Date Approved/Denied:   PASRR Number: [id number]  Discharge Plan: SNF    Current Diagnoses: Patient Active Problem List   Diagnosis Date Noted  . Hyponatremia 09/26/2016  . Chest pain 09/22/2016  . 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  . Abnormal EKG 09/21/2016  . Chronic pain 09/21/2016  . PAF (paroxysmal atrial fibrillation) ([place]) 09/21/2016  . Essential hypertension 05/02/2016  . Carotid stenosis 05/02/2016  . CAD in native artery   . Atypical chest pain   . Uncontrolled type 2 diabetes mellitus with complication, with long-term current use of insulin ([place])   . Hyperlipidemia   . Pain of left upper extremity   . Angina pectoris ([place])   . Abdominal pain   . Chest pain, central 03/13/2015  . Hypertensive urgency 03/13/2015    Orientation RESPIRATION BLADDER Height & Weight     Self, Time, Situation, Place  Normal Continent Weight: 219 lb 1.6 oz (99.4 kg) Height:  6' (182.9 cm)  BEHAVIORAL SYMPTOMS/MOOD NEUROLOGICAL BOWEL NUTRITION STATUS      Continent Diet (Cardiac, carb limited due to DM)  AMBULATORY STATUS COMMUNICATION OF NEEDS Skin   Extensive Assist Verbally Normal                       Personal Care Assistance Level of Assistance  Bathing, Feeding, Dressing Bathing Assistance: Limited assistance Feeding assistance: Independent Dressing Assistance: Limited assistance      Functional Limitations Info    Sight Info: Adequate Hearing Info: Adequate Speech Info: Adequate    SPECIAL CARE FACTORS FREQUENCY  PT (By licensed PT)     PT Frequency: up to 5X per day, 5 days per week              Contractures Contractures Info: Present    Additional Factors Info  Allergies Code Status Info: Full Allergies Info: Aspergum Aspirin, Heparin, Lisinopril   Insulin Sliding Scale Info: Novolog, 0-15 units, tid Isolation Precautions Info: None     Current Medications (09/28/2016):  This is the current hospital active medication list Current Facility-Administered Medications  Medication Dose Route Frequency Provider Last Rate Last Dose  . acetaminophen (TYLENOL) tablet 975 mg  975 mg Oral TID PRN Alexis Hugelmeyer, DO   975 mg at 09/28/16 0229  . alum & mag hydroxide-simeth (MAALOX/MYLANTA) 200-200-20 MG/5ML suspension 30 mL  30 mL Oral Q6H PRN Alexis Hugelmeyer, DO      . amLODipine (NORVASC) tablet 5 mg  5 mg Oral Daily Alexis Hugelmeyer, DO      . atorvastatin (LIPITOR) tablet 10 mg  10 mg Oral Daily Alexis Hugelmeyer, DO   10 mg at 09/27/16 1826  . bisacodyl (DULCOLAX) EC tablet 5 mg  5 mg Oral Daily PRN Alexis Hugelmeyer, DO      . carvedilol (COREG) tablet 37.5 mg  37.5 mg Oral Q12H Alexis Hugelmeyer, DO   37.5 mg at 09/27/16  2105  . fentaNYL (SUBLIMAZE) injection 25 mcg  25 mcg Intravenous Q1H PRN Merlyn Lot, MD   25 mcg at 09/26/16 2243  . guaiFENesin-dextromethorphan (ROBITUSSIN DM) 100-10 MG/5ML syrup 5 mL  5 mL Oral Q4H PRN Alexis Hugelmeyer, DO   5 mL at 09/28/16 0116  . hydrALAZINE (APRESOLINE) tablet 25 mg  25 mg Oral BID Alexis Hugelmeyer, DO   25 mg at 09/27/16 2105  . insulin aspart (novoLOG) injection 0-15 Units  0-15 Units Subcutaneous TID WC Alexis Hugelmeyer, DO   15 Units at 09/28/16 4982  . insulin aspart (novoLOG) injection 0-5 Units  0-5 Units Subcutaneous QHS Alexis Hugelmeyer, DO   5 Units at 09/27/16 2159  .  ipratropium-albuterol (DUONEB) 0.5-2.5 (3) MG/3ML nebulizer solution 3 mL  3 mL Nebulization Q6H PRN Alexis Hugelmeyer, DO   3 mL at 09/27/16 0859  . loperamide (IMODIUM) capsule 2 mg  2 mg Oral Q6H PRN Alexis Hugelmeyer, DO      . magnesium citrate solution 1 Bottle  1 Bottle Oral Once PRN Alexis Hugelmeyer, DO      . magnesium oxide (MAG-OX) tablet 400 mg  400 mg Oral BID Alexis Hugelmeyer, DO   400 mg at 09/28/16 6415  . menthol-cetylpyridinium (CEPACOL) lozenge 3 mg  1 lozenge Oral PRN Fritzi Mandes, MD      . metFORMIN (GLUCOPHAGE) tablet 500 mg  500 mg Oral BID WC Fritzi Mandes, MD   500 mg at 09/28/16 0852  . morphine (MS CONTIN) 12 hr tablet 15 mg  15 mg Oral Q8H Alexis Hugelmeyer, DO   15 mg at 09/28/16 0851  . ondansetron (ZOFRAN) injection 4 mg  4 mg Intravenous Q6H PRN Alexis Hugelmeyer, DO   4 mg at 09/28/16 0904  . ondansetron (ZOFRAN) tablet 8 mg  8 mg Oral Q8H PRN Alexis Hugelmeyer, DO      . senna-docusate (Senokot-S) tablet 1 tablet  1 tablet Oral QHS PRN Alexis Hugelmeyer, DO      . sodium chloride flush (NS) 0.9 % injection 3 mL  3 mL Intravenous Q12H Alexis Hugelmeyer, DO   3 mL at 09/28/16 0229     Discharge Medications: Please see discharge summary for a list of discharge medications.  Relevant Imaging Results:  Relevant Lab Results:   Additional Information SS# [id number]  Zettie Pho, LCSW

## 2016-09-28 NOTE — Clinical Social Work Note (Signed)
Clinical Social Work Assessment  Patient Details  Name: Bryan Scott MRN: [id number] Date of Birth: 31-Jan-1946  Date of referral:  09/28/16               Reason for consult:  Facility Placement                Permission sought to share information with:  Facility Art therapist granted to share information::  Yes, Verbal Permission Granted  Name::        Agency::     Relationship::     Contact Information:     Housing/Transportation Living arrangements for the past 2 months:  Mobile Home Source of Information:  Patient Patient Interpreter Needed:  None Criminal Activity/Legal Involvement Pertinent to Current Situation/Hospitalization:  No - Comment as needed Significant Relationships:  Siblings Lives with:  Self Do you feel safe going back to the place where you live?  Yes Need for family participation in patient care:  No (Coment)  Care giving concerns:  PT rec for SNF   Social Worker assessment / plan:  CSW met with patient at bedside to discuss dc planning. The patient gave verbal permission to conduct CSW referral and he named [facility] as his preference as his brother is in the facility for LTC. The CSW explained the referral process, and the patient indicated that, although he is 100% service connected at the [place] in [place], he does not want to use contract SNF benefits. He verbalized understanding that Medicare would be the primary payor.   At baseline, the client uses a Rollator for ambulation. He lives alone and is independent with all ADLs and IADLs.  The dc plan is for the patient to dc to [facility] on 4/16 as they have extended a bed offer.   Employment status:  Retired Forensic scientist:  [employer] PT Recommendations:  [place] / Referral to community resources:  [place]  Patient/Family's Response to care:  The patient thanked the CSW heartily.  Patient/Family's Understanding of and Emotional  Response to Diagnosis, Current Treatment, and Prognosis:  The patient is in agreement with the dc plan.  Emotional Assessment Appearance:  Appears stated age Attitude/Demeanor/Rapport:  Other (Pleasant) Affect (typically observed):  Appropriate, Tearful/Crying Orientation:  Oriented to Self, Oriented to Place, Oriented to  Time, Oriented to Situation Alcohol / Substance use:  Never Used Psych involvement (Current and /or in the community):  No (Comment)  Discharge Needs  Concerns to be addressed:  Discharge Planning Concerns, Care Coordination Readmission within the last 30 days:  No Current discharge risk:  None Barriers to Discharge:  Continued Medical Work up   [employer], LCSW 09/28/2016, 2:03 PM

## 2016-09-28 NOTE — Progress Notes (Addendum)
Patient demands that he be given an antibiotic to break up this stuff in my lungs. States that he physically is unable to to cough it up. Education provided, attempted deep breathing and coughing exercises, to which patient replied, this isn't working I'm going to throw up. Paged MD. Will speak with patient.  New orders placed for Mucinex. Pt agreeable to current plan.

## 2016-09-28 NOTE — Progress Notes (Signed)
[facility] NAME: [facility]    MR#:  [id number]  DATE OF BIRTH:  June 01, 1946  SUBJECTIVE:  Patient came in with generalized weakness not able to eat nausea and vomiting. He was recently admitted and discharged on 09/24/2016 after diagnosed with flu. Doing much better. Able to eat. Still weak REVIEW OF SYSTEMS:   Review of Systems  Constitutional: Negative for chills, fever and weight loss.  HENT: Negative for ear discharge, ear pain and nosebleeds.   Eyes: Negative for blurred vision, pain and discharge.  Respiratory: Negative for sputum production, shortness of breath, wheezing and stridor.   Cardiovascular: Negative for chest pain, palpitations, orthopnea and PND.  Gastrointestinal: Positive for nausea and vomiting. Negative for abdominal pain and diarrhea.  Genitourinary: Negative for frequency and urgency.  Musculoskeletal: Positive for back pain. Negative for joint pain.  Neurological: Positive for weakness. Negative for sensory change, speech change and focal weakness.  Psychiatric/Behavioral: Negative for depression and hallucinations. The patient is not nervous/anxious.    Tolerating Diet:some Tolerating PT: Recommends rehabilitation  DRUG ALLERGIES:   Allergies  Allergen Reactions  . 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 Heparin Other (See Comments)    Reaction:  Bleeding   . Lisinopril Swelling    VITALS:  Blood pressure 136/66, pulse 67, temperature 97.5 F (36.4 C), temperature source Oral, resp. rate 18, height 6' (1.829 m), weight 99.4 kg (219 lb 1.6 oz), SpO2 92 %.  PHYSICAL EXAMINATION:   Physical Exam  GENERAL:  71 y.o.-year-old patient lying in the bed with no acute distress. Obese. Appears chronically ill appearing EYES: Pupils equal, round, reactive to light and accommodation. No scleral icterus. Extraocular muscles intact.  HEENT: Head atraumatic, normocephalic. Oropharynx and nasopharynx clear.   NECK:  Supple, no jugular venous distention. No thyroid enlargement, no tenderness.  LUNGS: Normal breath sounds bilaterally, no wheezing, rales, rhonchi. No use of accessory muscles of respiration.  CARDIOVASCULAR: S1, S2 normal. No murmurs, rubs, or gallops.  ABDOMEN: Soft, nontender, nondistended. Bowel sounds present. No organomegaly or mass.  EXTREMITIES: No cyanosis, clubbing or edema b/l.    NEUROLOGIC: Cranial nerves II through XII are intact. No focal Motor or sensory deficits b/l.   PSYCHIATRIC:  patient is alert and oriented x 3.  SKIN: No obvious rash, lesion, or ulcer.   LABORATORY PANEL:  CBC  Recent Labs Lab 09/27/16 0448  WBC 3.8  HGB 14.7  HCT 41.2  PLT 95*    Chemistries   Recent Labs Lab 09/27/16 0448  NA 132*  K 4.4  CL 100*  CO2 20*  GLUCOSE 281*  BUN 21*  CREATININE 1.07  CALCIUM 8.7*  MG 2.1  AST 48*  ALT 46  ALKPHOS 55  BILITOT 1.4*   Cardiac Enzymes  Recent Labs Lab 09/26/16 1958  TROPONINI <0.03   RADIOLOGY:  Dg Chest 2 View  Result Date: 09/26/2016 CLINICAL DATA:  Initial evaluation for acute shortness of breath with productive cough, concern for pneumonia. EXAM: CHEST  2 VIEW COMPARISON:  Prior radiograph from 09/20/2016. FINDINGS: Median sternotomy wires underlying CABG markers again noted. Mild cardiomegaly is stable. Mediastinal silhouette within normal limits. Mild elevation the left hemidiaphragm. Few scattered linear densities within the lung bases most consistent with subsegmental atelectasis. No focal infiltrates or consolidative opacity to suggest pneumonia. No pulmonary edema or pleural effusion. No pneumothorax. No acute osseous abnormality. Cervical ACDF and fixation hardware at the left shoulder partially visualized. IMPRESSION: 1. Mild bibasilar  subsegmental atelectasis. Atelectasis. No findings to suggest pneumonia. 2. No other radiographic evidence for active cardiopulmonary disease. 3. Stable cardiomegaly with sequelae  of prior CABG. No pulmonary edema. Electronically Signed   By: Jeannine Boga M.D.   On: 09/26/2016 20:27   ASSESSMENT AND PLAN:   71 y.o. male with a history of CAD, GERD, HLD, HTN, ischemic cardiomyopathy, PAF, DM2 now being admitted with:  #. Near syncope while vomiting, likely secondary to dehydration -recieved IV fluid hydration -able to tolerate po now  #. Hyponatremia - received IV fluids -123---132  #. Hypokalemia - IV replacement secondary to nausea  #. Bronchitis, recent Influenza B-resolved - Nebs, O2, expectorants -improved  #. H/O Diabetes - Accuchecks achs with RISS coverage - Heart healthy, carb controlled diet -a1c 10.0. Will restart metformin 500 mg bid (pt was on it few years ago)  #. History of HLD - Continue Lipitor  #. History of HTN - Continue Norvasc, Coreg, hydralazine   Physical therapy recommends rehabilitation social worker consult placed  D/c in am to rehab Case discussed with Care Management/Social Worker. Management plans discussed with the patient, family and they are in agreement.  CODE STATUS: Full  DVT Prophylaxis: Lovenox TOTAL TIME TAKING CARE OF THIS PATIENT: 30 minutes.  >50% time spent on counselling and coordination of care  POSSIBLE D/C IN 1 DAYS, DEPENDING ON CLINICAL CONDITION.  Note: This dictation was prepared with Dragon dictation along with smaller phrase technology. Any transcriptional errors that result from this process are unintentional.  Kaesyn Johnston M.D on 09/28/2016 at 8:27 AM  Between 7am to 6pm - Pager - [phone]  After 6pm go to [email or web address] - password [email or web address] [facility]  Office  [phone]  CC: Primary care physician; [name], MD

## 2016-09-29 LAB — GLUCOSE, CAPILLARY
GLUCOSE-CAPILLARY: 275 mg/dL — AB (ref 65–99)
Glucose-Capillary: 231 mg/dL — ABNORMAL HIGH (ref 65–99)

## 2016-09-29 MED ORDER — METFORMIN HCL 500 MG PO TABS: 500.0000 mg | ORAL_TABLET | Freq: Two times a day (BID) | ORAL | 1 refills | Status: DC

## 2016-09-29 NOTE — Clinical Social Work Placement (Signed)
   CLINICAL SOCIAL WORK PLACEMENT  NOTE  Date:  09/29/2016  Patient Details  Name: Bryan Scott MRN: [id number] Date of Birth: 1945/06/22  Clinical Social Work is seeking post-discharge placement for this patient at the [place] level of care (*CSW will initial, date and re-position this form in  chart as items are completed):  Yes   Patient/family provided with [facility] Work Department's list of facilities offering this level of care within the geographic area requested by the patient (or if unable, by the patient's family).  Yes   Patient/family informed of their freedom to choose among providers that offer the needed level of care, that participate in Medicare, Medicaid or managed care program needed by the patient, have an available bed and are willing to accept the patient.  Yes   Patient/family informed of [facility]'s ownership interest in [facility] and [facility], as well as of the fact that they are under no obligation to receive care at these facilities.  PASRR submitted to EDS on       PASRR number received on       Existing PASRR number confirmed on 09/28/16     FL2 transmitted to all facilities in geographic area requested by pt/family on 09/28/16     FL2 transmitted to all facilities within larger geographic area on       Patient informed that his/her managed care company has contracts with or will negotiate with certain facilities, including the following:        Yes   Patient/family informed of bed offers received.  Patient chooses bed at  [facility] )     Physician recommends and patient chooses bed at      Patient to be transferred to  [facility] ) on 09/29/16.  Patient to be transferred to facility by  [facility] )     Patient family notified on 09/29/16 of transfer.  Name of family member notified:   (Patient's sister Doran Heater is aware of D/C today. )     PHYSICIAN       Additional  Comment:    _______________________________________________ Alexis Mizuno, Veronia Beets, LCSW 09/29/2016, 12:32 PM

## 2016-09-29 NOTE — Progress Notes (Signed)
Patient is medically stable for D/C to [facility] today. Per [facility] admissions coordinator at [facility] patient can come today to room 104. [facility] staff member came to [facility] and completed admissions paper work with patient today. RN will call report and arrange EMS for transport. Clinical Education officer, museum (CSW) sent D/C orders to [employer] via [employer]. Patient is aware of above. CSW contacted patient's sister Doran Heater and made her aware of above. Please reconsult if future social work needs arise. CSW signing off.   [employer], LCSW [phone]

## 2016-09-29 NOTE — Progress Notes (Signed)
Patient is alert and oriented and able to verbalize needs. No complaints of pain right now. Vital signs stable. PIV removed. No concerns voiced at this time. EMS called to transport patient to [facility]. Tried 3x to call report and left voicemail with phone number on last attempt.

## 2016-09-29 NOTE — Care Management Important Message (Signed)
Important Message  Patient Details  Name: Bryan Scott MRN: [id number] Date of Birth: [birth year]   Medicare Important Message Given:  Yes    Jolly Mango, RN 09/29/2016, 11:03 AM

## 2016-09-29 NOTE — Discharge Summary (Signed)
[facility] NAME: [facility]    MR#:  [id number]  DATE OF BIRTH:  [birth year]  DATE OF ADMISSION:  09/26/2016 ADMITTING PHYSICIAN: Harvie Bridge, DO  DATE OF DISCHARGE: 09/29/2016  PRIMARY CARE PHYSICIAN: [name], MD    ADMISSION DIAGNOSIS:  Shortness of breath [R06.02] Hypokalemia [E87.6] Hyponatremia [E87.1] Magnesium deficiency [E61.2] Bronchitis [J40]  DISCHARGE DIAGNOSIS:  Gen weakness/Near syncope due to dehydration Hyponatremia/hypokalemia-improved   SECONDARY DIAGNOSIS:   Past Medical History:  Diagnosis Date  . 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 Cancer ([place])   . Carotid artery disease (HCC)    > 75% bilateral ICA stenoses on 01/2013 CT  . Chronic pain syndrome   . Coronary artery disease    s/p CABG ~ 2007  . GERD (gastroesophageal reflux disease)   . Heparin allergy    Bleeding  . History of tobacco use   . Hyperlipidemia   . Hypertension   . Ischemic cardiomyopathy   . Paroxysmal atrial fibrillation (HCC)    On Xarelto anticoagulation  . Type 2 diabetes mellitus (HCC)    A1C 6.8% in 01/2013    HOSPITAL COURSE:   71 y.o.malewith a history of CAD, GERD, HLD, HTN, ischemic cardiomyopathy, PAF, DM2now being admitted with:  #. Near syncope while vomiting, likely secondary to dehydration -recieved IV fluid hydration -able to tolerate po now  #. Hyponatremia - received IV fluids -123---132  #. Hypokalemia - IV replacement secondary to nausea  #. Bronchitis, recent Influenza B-resolved - Nebs, O2, expectorants -improved  #. H/ODiabetes - Accuchecks achs with RISS coverage - Heart healthy, carb controlled diet -a1c 10.0. Will restart metformin 500 mg bid (pt was on it few years ago)  #. History of HLD - Continue Lipitor  #. History of HTN - Continue Norvasc, Coreg, hydralazine   Physical therapy recommends rehabilitation social worker consult  placed  D/c  to rehab CONSULTS OBTAINED:    DRUG ALLERGIES:   Allergies  Allergen Reactions  . 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 Heparin Other (See Comments)    Reaction:  Bleeding   . Lisinopril Swelling    DISCHARGE MEDICATIONS:   Current Discharge Medication List    START taking these medications   Details  metFORMIN (GLUCOPHAGE) 500 MG tablet Take 1 tablet (500 mg total) by mouth 2 (two) times daily with a meal. Qty: 60 tablet, Refills: 1      CONTINUE these medications which have NOT CHANGED   Details  amLODipine (NORVASC) 5 MG tablet Take 1 tablet (5 mg total) by mouth daily. Qty: 30 tablet, Refills: 0    atorvastatin (LIPITOR) 10 MG tablet Take 1 tablet (10 mg total) by mouth daily. Qty: 30 tablet, Refills: 0    carvedilol (COREG) 12.5 MG tablet Take 3 tablets (37.5 mg total) by mouth every 12 (twelve) hours. Qty: 60 tablet, Refills: 0    hydrALAZINE (APRESOLINE) 25 MG tablet Take 1 tablet (25 mg total) by mouth 3 (three) times daily. Qty: 90 tablet, Refills: 0    magnesium oxide (MAG-OX) 400 MG tablet Take 400 mg by mouth 2 (two) times daily.     morphine (MS CONTIN) 15 MG 12 hr tablet Take 15 mg by mouth every 8 (eight) hours.    rivaroxaban (XARELTO) 20 MG TABS tablet Take 1 tablet (20 mg total) by mouth daily with supper. Qty: 30 tablet, Refills: 2    acetaminophen (TYLENOL) 325 MG tablet Take 975  mg by mouth 3 (three) times daily as needed for mild pain or fever.    alum & mag hydroxide-simeth (MAALOX/MYLANTA) 200-200-20 MG/5ML suspension Take 30 mLs by mouth every 6 (six) hours as needed for indigestion or heartburn. Qty: 355 mL, Refills: 0    guaiFENesin-dextromethorphan (ROBITUSSIN DM) 100-10 MG/5ML syrup Take 5 mLs by mouth every 4 (four) hours as needed for cough. Qty: 118 mL, Refills: 0    loperamide (IMODIUM) 2 MG capsule Take 1 capsule (2 mg total) by mouth every 6 (six) hours as needed for diarrhea or loose stools. Qty: 30 capsule,  Refills: 0    ondansetron (ZOFRAN) 8 MG tablet Take 8 mg by mouth every 8 (eight) hours as needed for nausea or vomiting.     oxyCODONE-acetaminophen (PERCOCET) 5-325 MG tablet Take 1 tablet by mouth every 8 (eight) hours as needed for severe pain. Qty: 20 tablet, Refills: 0    pantoprazole (PROTONIX) 40 MG tablet Take 1 tablet (40 mg total) by mouth daily. Qty: 30 tablet, Refills: 0    saccharomyces boulardii (FLORASTOR) 250 MG capsule Take 1 capsule (250 mg total) by mouth 2 (two) times daily. Qty: 30 capsule, Refills: 0      STOP taking these medications     oseltamivir (TAMIFLU) 75 MG capsule      naproxen (NAPROSYN) 500 MG tablet         If you experience worsening of your admission symptoms, develop shortness of breath, life threatening emergency, suicidal or homicidal thoughts you must seek medical attention immediately by calling 911 or calling your MD immediately  if symptoms less severe.  You Must read complete instructions/literature along with all the possible adverse reactions/side effects for all the Medicines you take and that have been prescribed to you. Take any new Medicines after you have completely understood and accept all the possible adverse reactions/side effects.   Please note  You were cared for by a hospitalist during your hospital stay. If you have any questions about your discharge medications or the care you received while you were in the hospital after you are discharged, you can call the unit and asked to speak with the hospitalist on call if the hospitalist that took care of you is not available. Once you are discharged, your primary care physician will handle any further medical issues. Please note that NO REFILLS for any discharge medications will be authorized once you are discharged, as it is imperative that you return to your primary care physician (or establish a relationship with a primary care physician if you do not have one) for your aftercare  needs so that they can reassess your need for medications and monitor your lab values. Today   SUBJECTIVE    Doing well VITAL SIGNS:  Blood pressure 121/88, pulse 63, temperature 97.6 F (36.4 C), temperature source Oral, resp. rate 18, height 6' (1.829 m), weight 99.4 kg (219 lb 1.6 oz), SpO2 93 %.  I/O:   Intake/Output Summary (Last 24 hours) at 09/29/16 1116 Last data filed at 09/29/16 1005  Gross per 24 hour  Intake              440 ml  Output                0 ml  Net              440 ml    PHYSICAL EXAMINATION:  GENERAL:  71 y.o.-year-old patient lying in the bed with no acute distress.  EYES: Pupils equal, round, reactive to light and accommodation. No scleral icterus. Extraocular muscles intact.  HEENT: Head atraumatic, normocephalic. Oropharynx and nasopharynx clear.  NECK:  Supple, no jugular venous distention. No thyroid enlargement, no tenderness.  LUNGS: Normal breath sounds bilaterally, no wheezing, rales,rhonchi or crepitation. No use of accessory muscles of respiration.  CARDIOVASCULAR: S1, S2 normal. No murmurs, rubs, or gallops.  ABDOMEN: Soft, non-tender, non-distended. Bowel sounds present. No organomegaly or mass.  EXTREMITIES: No pedal edema, cyanosis, or clubbing.  NEUROLOGIC: Cranial nerves II through XII are intact. Muscle strength 5/5 in all extremities. Sensation intact. Gait not checked.  PSYCHIATRIC: The patient is alert and oriented x 3.  SKIN: No obvious rash, lesion, or ulcer.   DATA REVIEW:   CBC   Recent Labs Lab 09/27/16 0448  WBC 3.8  HGB 14.7  HCT 41.2  PLT 95*    Chemistries   Recent Labs Lab 09/27/16 0448  NA 132*  K 4.4  CL 100*  CO2 20*  GLUCOSE 281*  BUN 21*  CREATININE 1.07  CALCIUM 8.7*  MG 2.1  AST 48*  ALT 46  ALKPHOS 55  BILITOT 1.4*    Microbiology Results   Recent Results (from the past 240 hour(s))  Blood culture (routine x 2)     Status: None (Preliminary result)   Collection Time: 09/26/16  9:50  PM  Result Value Ref Range Status   Specimen Description BLOOD RIGHT ANTECUBITAL  Final   Special Requests   Final    BOTTLES DRAWN AEROBIC AND ANAEROBIC Blood Culture adequate volume   Culture NO GROWTH 3 DAYS  Final   Report Status PENDING  Incomplete  Blood culture (routine x 2)     Status: None (Preliminary result)   Collection Time: 09/26/16  9:50 PM  Result Value Ref Range Status   Specimen Description BLOOD BLOOD LEFT ARM  Final   Special Requests   Final    BOTTLES DRAWN AEROBIC AND ANAEROBIC Blood Culture adequate volume   Culture NO GROWTH 3 DAYS  Final   Report Status PENDING  Incomplete    RADIOLOGY:  No results found.   Management plans discussed with the patient, family and they are in agreement.  CODE STATUS:     Code Status Orders        Start     Ordered   09/26/16 2316  Full code  Continuous     09/26/16 2315    Code Status History    Date Active Date Inactive Code Status Order ID Comments User Context   09/20/2016  5:45 PM 09/24/2016  5:09 PM Full Code [id number]  Henreitta Leber, MD Inpatient   07/31/2015  3:32 AM 08/01/2015  6:32 PM Full Code [id number]  Harrie Foreman, MD Inpatient   03/13/2015  2:51 AM 03/16/2015  5:26 PM Full Code [id number]  Lytle Butte, MD ED      TOTAL TIME TAKING CARE OF THIS PATIENT: 40 minutes.    Destina Mantei M.D on 09/29/2016 at 11:16 AM  Between 7am to 6pm - Pager - [phone] After 6pm go to [email or web address] - password [email or web address] [facility]  Office  [phone]  CC: Primary care physician; [name], MD

## 2016-09-30 ENCOUNTER — Encounter: Payer: Self-pay | Admitting: Internal Medicine

## 2016-09-30 ENCOUNTER — Non-Acute Institutional Stay (SKILLED_NURSING_FACILITY): Payer: Medicare Other | Admitting: Internal Medicine

## 2016-09-30 DIAGNOSIS — E118 Type 2 diabetes mellitus with unspecified complications: Secondary | ICD-10-CM | POA: Diagnosis not present

## 2016-09-30 DIAGNOSIS — G8928 Other chronic postprocedural pain: Secondary | ICD-10-CM

## 2016-09-30 DIAGNOSIS — K219 Gastro-esophageal reflux disease without esophagitis: Secondary | ICD-10-CM

## 2016-09-30 DIAGNOSIS — J209 Acute bronchitis, unspecified: Secondary | ICD-10-CM

## 2016-09-30 DIAGNOSIS — IMO0002 Reserved for concepts with insufficient information to code with codable children: Secondary | ICD-10-CM

## 2016-09-30 DIAGNOSIS — I10 Essential (primary) hypertension: Secondary | ICD-10-CM

## 2016-09-30 DIAGNOSIS — E871 Hypo-osmolality and hyponatremia: Secondary | ICD-10-CM | POA: Diagnosis not present

## 2016-09-30 DIAGNOSIS — R531 Weakness: Secondary | ICD-10-CM

## 2016-09-30 DIAGNOSIS — I2510 Atherosclerotic heart disease of native coronary artery without angina pectoris: Secondary | ICD-10-CM | POA: Diagnosis not present

## 2016-09-30 DIAGNOSIS — D696 Thrombocytopenia, unspecified: Secondary | ICD-10-CM | POA: Diagnosis not present

## 2016-09-30 DIAGNOSIS — I480 Paroxysmal atrial fibrillation: Secondary | ICD-10-CM | POA: Diagnosis not present

## 2016-09-30 DIAGNOSIS — R110 Nausea: Secondary | ICD-10-CM

## 2016-09-30 DIAGNOSIS — E1165 Type 2 diabetes mellitus with hyperglycemia: Secondary | ICD-10-CM

## 2016-09-30 NOTE — Progress Notes (Signed)
LOCATION: [facility]  PCP: [name], MD   Code Status: Full Code  Goals of care: Advanced Directive information Advanced Directives 09/26/2016  Does Patient Have a Medical Advance Directive? No  Would patient like information on creating a medical advance directive? No - Patient declined       Extended Emergency Contact Information Primary Emergency Contact: [name],Ceciliah G Address: [address]          [place], [place] [address] [place] Phone: [phone] Relation: Sister   Allergies  Allergen Reactions  . 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 Heparin Other (See Comments)    Reaction:  Bleeding   . Lisinopril Swelling    Chief Complaint  Patient presents with  . New Admit To SNF    New Admission Visit      HPI:  Patient is a 71 y.o. male seen today for short term rehabilitation post hospital admission from 09/26/16-09/29/16 with presyncope thought to be secondary to dehydration with electrolyte abnormality. He received iv fluids. He was also placed on nebuliser, oxygen and expectorant for post influenza bronchitis. He has medical history of HTN, CAD, GERD, Afib, DM2 among others. He was recently in the hospital with flu. He is seen in his room today.   Review of Systems:  Constitutional: Negative for fever, chills. Feels weak and tired.  HENT: Negative for headache, nasal discharge, sore throat. Positive for occasional difficulty swallowing solid food.   Eyes: Negative for blurred vision, double vision and discharge.  Respiratory: Negative for wheezing. Positive for shortness of breath, chest tightness and cough.    Cardiovascular: Negative for chest pain, palpitations, leg swelling.  Gastrointestinal: Negative for heartburn,abdominal pain, loss of appetite, melena, diarrhea and constipation. Positive for nausea and vomited  x 2 last night. Last bowel movement was last night. Genitourinary: Negative for dysuria.  Musculoskeletal: Negative  for fall in the facility. positive for chronic back pain.  Skin: Negative for itching, rash.  Neurological: Negative for dizziness. Psychiatric/Behavioral: Negative for depression.   Past Medical History:  Diagnosis Date  . 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 Cancer ([facility])   . Carotid artery disease (HCC)    > 75% bilateral ICA stenoses on 01/2013 CT  . Chronic pain syndrome   . Coronary artery disease    s/p CABG ~ 2007  . GERD (gastroesophageal reflux disease)   . Heparin allergy    Bleeding  . History of tobacco use   . Hyperlipidemia   . Hypertension   . Ischemic cardiomyopathy   . Paroxysmal atrial fibrillation (HCC)    On Xarelto anticoagulation  . Type 2 diabetes mellitus (HCC)    A1C 6.8% in 01/2013   Past Surgical History:  Procedure Laterality Date  . CARDIAC CATHETERIZATION    . CORONARY ARTERY BYPASS GRAFT  2007   Social History:   reports that he has quit smoking. His smoking use included Cigarettes. He has a 30.00 pack-year smoking history. He has never used smokeless tobacco. He reports that he does not drink alcohol or use drugs.  Family History  Problem Relation Age of Onset  . Heart disease Mother 70    Deceased  . CVA Mother   . Seizures Father 60    Deceased from complications with seizures    Medications: Allergies as of 09/30/2016      Reactions   Aspergum [aspirin] Anaphylaxis   Heparin Other (See Comments)   Reaction:  Bleeding  Lisinopril Swelling      Medication List       Accurate as of 09/30/16 11:57 AM. Always use your most recent med list.          acetaminophen 325 MG tablet Commonly known as:  TYLENOL Take 975 mg by mouth 3 (three) times daily as needed for mild pain or fever.   alum & mag hydroxide-simeth 200-200-20 MG/5ML suspension Commonly known as:  MAALOX/MYLANTA Take 30 mLs by mouth every 6 (six) hours as needed for indigestion or heartburn.   amLODipine 5 MG tablet Commonly known as:  NORVASC Take 1  tablet (5 mg total) by mouth daily.   atorvastatin 10 MG tablet Commonly known as:  LIPITOR Take 1 tablet (10 mg total) by mouth daily.   carvedilol 12.5 MG tablet Commonly known as:  COREG Take 12.5 mg by mouth daily.   guaiFENesin-dextromethorphan 100-10 MG/5ML syrup Commonly known as:  ROBITUSSIN DM Take 5 mLs by mouth every 4 (four) hours as needed for cough.   hydrALAZINE 25 MG tablet Commonly known as:  APRESOLINE Take 1 tablet (25 mg total) by mouth 3 (three) times daily.   loperamide 2 MG capsule Commonly known as:  IMODIUM Take 1 capsule (2 mg total) by mouth every 6 (six) hours as needed for diarrhea or loose stools.   magnesium oxide 400 MG tablet Commonly known as:  MAG-OX Take 400 mg by mouth daily.   morphine 30 MG tablet Commonly known as:  MSIR Take 30 mg by mouth 3 (three) times daily.   ondansetron 8 MG tablet Commonly known as:  ZOFRAN Take 8 mg by mouth every 8 (eight) hours as needed for nausea or vomiting.   oxyCODONE-acetaminophen 5-325 MG tablet Commonly known as:  PERCOCET Take 1 tablet by mouth every 8 (eight) hours as needed for severe pain.   pantoprazole 40 MG tablet Commonly known as:  PROTONIX Take 1 tablet (40 mg total) by mouth daily.   rivaroxaban 20 MG Tabs tablet Commonly known as:  XARELTO Take 1 tablet (20 mg total) by mouth daily with supper.   saccharomyces boulardii 250 MG capsule Commonly known as:  FLORASTOR Take 1 capsule (250 mg total) by mouth 2 (two) times daily.       Immunizations: Immunization History  Administered Date(s) Administered  . PPD Test 09/24/2016     Physical Exam: Vitals:   09/30/16 1144  BP: (!) 148/88  Pulse: 68  Resp: 20  Temp: 98.1 F (36.7 C)  TempSrc: Oral  SpO2: 97%  Weight: 220 lb (99.8 kg)  Height: 6' (1.829 m)   Body mass index is 29.84 kg/m.  General- elderly male, obese, in no acute distress Head- normocephalic, atraumatic Nose- no nasal discharge Throat- moist  mucus membrane, normal oropharynx Eyes- PERRLA, EOMI, no pallor, no icterus, no discharge, normal conjunctiva, normal sclera Neck- no cervical lymphadenopathy Cardiovascular- normal s1,s2, no murmur Respiratory- bilateral poor air movement, occasional wheeze, no rhonchi, no crackles, no use of accessory muscles Abdomen- bowel sounds present, soft, non tender, no guarding or rigidity Musculoskeletal- able to move all 4 extremities, generalized weakness, no leg edema Neurological- alert and oriented to person, place and time Skin- warm and dry Psychiatry- normal mood and affect    Labs reviewed: Basic Metabolic Panel:  Recent Labs  09/21/16 0904 09/22/16 0251 09/26/16 1958 09/27/16 0448  NA  --  130* 123* 132*  K  --  3.5 2.9* 4.4  CL  --  96* 90* 100*  CO2  --  27 24 20*  GLUCOSE  --  364* 239* 281*  BUN  --  29* 19 21*  CREATININE  --  1.37* 1.11 1.07  CALCIUM  --  8.4* 8.5* 8.7*  MG 1.6*  --  1.5* 2.1  PHOS  --   --  2.8  --    Liver Function Tests:  Recent Labs  09/27/16 0448  AST 48*  ALT 46  ALKPHOS 55  BILITOT 1.4*  PROT 6.8  ALBUMIN 3.0*   No results for input(s): LIPASE, AMYLASE in the last 8760 hours. No results for input(s): AMMONIA in the last 8760 hours. CBC:  Recent Labs  09/21/16 0138 09/26/16 1958 09/27/16 0448  WBC 6.9 7.0 3.8  NEUTROABS  --  5.1  --   HGB 14.7 14.3 14.7  HCT 41.8 40.0 41.2  MCV 92.9 91.6 90.4  PLT 137* 102* 95*   Cardiac Enzymes:  Recent Labs  09/20/16 2141 09/21/16 0138 09/26/16 1958  TROPONINI <0.03 <0.03 <0.03   BNP: Invalid input(s): POCBNP CBG:  Recent Labs  09/28/16 2122 09/29/16 0734 09/29/16 1127  GLUCAP 215* 275* 231*    Radiological Exams: Dg Chest 2 View  Result Date: 09/26/2016 CLINICAL DATA:  Initial evaluation for acute shortness of breath with productive cough, concern for pneumonia. EXAM: CHEST  2 VIEW COMPARISON:  Prior radiograph from 09/20/2016. FINDINGS: Median sternotomy wires  underlying CABG markers again noted. Mild cardiomegaly is stable. Mediastinal silhouette within normal limits. Mild elevation the left hemidiaphragm. Few scattered linear densities within the lung bases most consistent with subsegmental atelectasis. No focal infiltrates or consolidative opacity to suggest pneumonia. No pulmonary edema or pleural effusion. No pneumothorax. No acute osseous abnormality. Cervical ACDF and fixation hardware at the left shoulder partially visualized. IMPRESSION: 1. Mild bibasilar subsegmental atelectasis. Atelectasis. No findings to suggest pneumonia. 2. No other radiographic evidence for active cardiopulmonary disease. 3. Stable cardiomegaly with sequelae of prior CABG. No pulmonary edema. Electronically Signed   By: Jeannine Boga M.D.   On: 09/26/2016 20:27   Dg Chest 2 View  Result Date: 09/20/2016 CLINICAL DATA:  Chest pain. EXAM: CHEST  2 VIEW COMPARISON:  03/13/2015 is FINDINGS: The cardiac silhouette, mediastinal and hilar contours are within normal limits and stable. Stable surgical changes from bypass surgery. The lungs demonstrate mild chronic bronchitic changes but no infiltrates, edema or effusions. Stable biapical pleural and parenchymal scarring changes. Bilateral nipple shadows are noted. No significant bony findings. IMPRESSION: No acute cardiopulmonary findings. Electronically Signed   By: Marijo Sanes M.D.   On: 09/20/2016 14:20    Assessment/Plan  Generalized weakness From deconditioning. Will have him work with physical therapy and occupational therapy team to help with gait training and muscle strengthening exercises.fall precautions. Skin care. Encourage to be out of bed.   Acute bronchitis Currently on guaifenesin dm 5 ml q4h prn. Change this to guaifenesin10 ml qid. Provide duoneb qid. Add incentive spirometer to prevent atelectasis. If no improvement, consider Chest xray to assess further  Hyponatremia s/p iv fluid in hospital. Check bmp.  Maintain hydration  Nausea  With dry heaves from chest congestion. Added duoneb and scheduled guaifenesin which should help with his symptom by loosening his mucus. Continue prn zofran for nausea. Monitor his lytes.   Thrombocytopenia No bleed reported, monitor platelet count  gerd Stable, continue pantoprazole 40 mg daily  Hypomagnesemia Continue magnesium supplement, check Mg level  Hypertension Elevated BP, monitor BP reading. Continue amlodipine 5 mg daily, carvedilol  12.5 mg daily and hydralazine 25 mg tid.   Type 2 DM uncontrolled Lab Results  Component Value Date   HGBA1C 10.0 (H) 09/22/2016   Not on any cbg check and hypoglycemic agent at present. Check cbg with meals for now. Start lantus 10 u at bedtime and SSI novlog with meals. Per d/c summary plan was to start metformin at discharge but with his nausea and vomiting, will avoid this for now. Continue statin.  CAD Chest pain free. Continue carvedilol 12.5 mg daily and atorvastatin 10 mg daily  afib Controlled HR. Continue carvedilol and xarelto.  Chronic back pain s/p multiple back surgeries. Continue percocet 5-325 mg q8h prn pain and morphine sulfate ER 30 mg tid. Get PMR consult   Goals of care: short term rehabilitation   Labs/tests ordered: cbc, cmp, mg 10/02/16  Family/ staff Communication: reviewed care plan with patient and nursing supervisor  I have spent greater than 50 minutes for this encounter which includes reviewing hospital records, addressing above mentioned concerns, reviewing care plan with patient, answering patient's concerns and counseling.    Blanchie Serve, MD Internal Medicine [facility] [address] [place], [place] [address] Cell Phone (Monday-Friday 8 am - 5 pm): [phone] On Call: [phone] and follow prompts after 5 pm and on weekends Office Phone: [phone] Office Fax: [phone]

## 2016-10-01 LAB — CULTURE, BLOOD (ROUTINE X 2)
Culture: NO GROWTH
Culture: NO GROWTH
SPECIAL REQUESTS: ADEQUATE
SPECIAL REQUESTS: ADEQUATE

## 2016-10-02 LAB — BASIC METABOLIC PANEL
BUN: 15 mg/dL (ref 4–21)
Creatinine: 1.2 mg/dL (ref 0.6–1.3)
Glucose: 288 mg/dL
POTASSIUM: 4.4 mmol/L (ref 3.4–5.3)
Sodium: 130 mmol/L — AB (ref 137–147)

## 2016-10-02 LAB — HEPATIC FUNCTION PANEL
ALK PHOS: 64 U/L (ref 25–125)
ALT: 63 U/L — AB (ref 10–40)
AST: 30 U/L (ref 14–40)
Bilirubin, Total: 1.6 mg/dL

## 2016-10-02 LAB — CBC AND DIFFERENTIAL
HCT: 40 % — AB (ref 41–53)
HEMOGLOBIN: 14.3 g/dL (ref 13.5–17.5)
PLATELETS: 315 10*3/uL (ref 150–399)
WBC: 10.2 10^3/mL

## 2016-10-03 ENCOUNTER — Non-Acute Institutional Stay (SKILLED_NURSING_FACILITY): Payer: Medicare Other | Admitting: Family

## 2016-10-03 ENCOUNTER — Encounter: Payer: Self-pay | Admitting: Family

## 2016-10-03 DIAGNOSIS — E1165 Type 2 diabetes mellitus with hyperglycemia: Secondary | ICD-10-CM | POA: Diagnosis not present

## 2016-10-03 DIAGNOSIS — Z794 Long term (current) use of insulin: Secondary | ICD-10-CM | POA: Diagnosis not present

## 2016-10-03 DIAGNOSIS — E118 Type 2 diabetes mellitus with unspecified complications: Secondary | ICD-10-CM | POA: Diagnosis not present

## 2016-10-03 DIAGNOSIS — R0689 Other abnormalities of breathing: Secondary | ICD-10-CM

## 2016-10-03 DIAGNOSIS — R600 Localized edema: Secondary | ICD-10-CM

## 2016-10-03 DIAGNOSIS — IMO0002 Reserved for concepts with insufficient information to code with codable children: Secondary | ICD-10-CM

## 2016-10-03 NOTE — Progress Notes (Signed)
Location:  [facility] Room Number: 104 Place of Service:  SNF (31) Provider: Zyiere Rosemond FNP-C  [name], MD  Patient Care Team: [name], MD as PCP - General (Family Medicine)  Extended Emergency Contact Information Primary Emergency Contact: Hampton Abbot Address: [address]          [place], [place] [address] [place] Phone: [phone] Relation: Sister  Code Status:  Full Code  Goals of care: Advanced Directive information Advanced Directives 10/03/2016  Does Patient Have a Medical Advance Directive? No  Would patient like information on creating a medical advance directive? -     Chief Complaint  Patient presents with  . Acute Visit    Elevated blood sugar    HPI:  Pt is a 71 y.o. male seen today at [facility] for an acute visit for evaluation of high blood sugars. He has a significant medical history of HTN, CHF, CAD, Afib, Hyperlipidemia , chronic pain among other conditions. He is seen in his room today. He states his blood sugars have been running high. He would like to get back on his metformin. He states was taking metformin twice daily and blood sugars were under control. MD recently started on Lantus and Novolog per SSI. No signs of hyperglycemia reported.       Past Medical History:  Diagnosis Date  . 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 Cancer ([place])   . Carotid artery disease (HCC)    > 75% bilateral ICA stenoses on 01/2013 CT  . Chronic pain syndrome   . Coronary artery disease    s/p CABG ~ 2007  . GERD (gastroesophageal reflux disease)   . Heparin allergy    Bleeding  . History of tobacco use   . Hyperlipidemia   . Hypertension   . Ischemic cardiomyopathy   . Paroxysmal atrial fibrillation (HCC)    On Xarelto anticoagulation  . Type 2 diabetes mellitus (HCC)    A1C 6.8% in 01/2013   Past Surgical History:  Procedure Laterality Date  . CARDIAC  CATHETERIZATION    . CORONARY ARTERY BYPASS GRAFT  2007    Allergies  Allergen Reactions  . 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 Heparin Other (See Comments)    Reaction:  Bleeding   . Lisinopril Swelling    Allergies as of 10/03/2016      Reactions   Aspergum [aspirin] Anaphylaxis   Heparin Other (See Comments)   Reaction:  Bleeding    Lisinopril Swelling      Medication List       Accurate as of 10/03/16  3:34 PM. Always use your most recent med list.          acetaminophen 325 MG tablet Commonly known as:  TYLENOL Take 975 mg by mouth 3 (three) times daily as needed for mild pain or fever.   alum & mag hydroxide-simeth 200-200-20 MG/5ML suspension Commonly known as:  MAALOX/MYLANTA Take 30 mLs by mouth every 6 (six) hours as needed for indigestion or heartburn.   amLODipine 5 MG tablet Commonly known as:  NORVASC Take 1 tablet (5 mg total) by mouth daily.   atorvastatin 10 MG tablet Commonly known as:  LIPITOR Take 1 tablet (10 mg total) by mouth daily.   carvedilol 12.5 MG tablet Commonly known as:  COREG Take 12.5 mg by mouth daily.   guaiFENesin-dextromethorphan 100-10 MG/5ML syrup Commonly known as:  ROBITUSSIN DM Take 5  mLs by mouth every 4 (four) hours as needed for cough.   hydrALAZINE 25 MG tablet Commonly known as:  APRESOLINE Take 1 tablet (25 mg total) by mouth 3 (three) times daily.   insulin glargine 100 UNIT/ML injection Commonly known as:  LANTUS Inject into the skin. 10 units sq daily at bedtime   loperamide 2 MG capsule Commonly known as:  IMODIUM Take 1 capsule (2 mg total) by mouth every 6 (six) hours as needed for diarrhea or loose stools.   magnesium oxide 400 MG tablet Commonly known as:  MAG-OX Take 400 mg by mouth daily.   morphine 15 MG tablet Commonly known as:  MSIR Take 15 mg by mouth 3 (three) times daily.   NOVOLOG 100 UNIT/ML injection Generic drug:  insulin aspart Inject into the skin. Three times daily with  meals. Sliding scale CBG <150 -0 unit, 151-250 5 units; 251-350 8 units   ondansetron 8 MG tablet Commonly known as:  ZOFRAN Take 8 mg by mouth every 8 (eight) hours as needed for nausea or vomiting.   oxyCODONE-acetaminophen 5-325 MG tablet Commonly known as:  PERCOCET Take 1 tablet by mouth every 8 (eight) hours as needed for severe pain.   pantoprazole 40 MG tablet Commonly known as:  PROTONIX Take 1 tablet (40 mg total) by mouth daily.   rivaroxaban 20 MG Tabs tablet Commonly known as:  XARELTO Take 1 tablet (20 mg total) by mouth daily with supper.   saccharomyces boulardii 250 MG capsule Commonly known as:  FLORASTOR Take 1 capsule (250 mg total) by mouth 2 (two) times daily.       Review of Systems  Constitutional: Negative for activity change, appetite change, chills, fatigue and fever.  HENT: Negative for congestion, sinus pain, sinus pressure, sneezing and sore throat.   Eyes: Negative.   Respiratory: Negative for cough, chest tightness, shortness of breath and wheezing.   Cardiovascular: Positive for leg swelling. Negative for chest pain and palpitations.  Gastrointestinal: Negative for abdominal distention, abdominal pain, constipation, diarrhea, nausea and vomiting.  Endocrine: Negative for cold intolerance, heat intolerance, polydipsia, polyphagia and polyuria.  Genitourinary: Negative for dysuria, flank pain, frequency and urgency.  Musculoskeletal: Positive for back pain and gait problem.  Skin: Negative for color change, pallor and rash.  Neurological: Negative for dizziness, seizures, light-headedness and headaches.  Psychiatric/Behavioral: Negative for agitation, confusion, hallucinations and sleep disturbance. The patient is not nervous/anxious.     Immunization History  Administered Date(s) Administered  . PPD Test 09/24/2016   Pertinent  Health Maintenance Due  Topic Date Due  . FOOT EXAM  07/24/1955  . OPHTHALMOLOGY EXAM  07/24/1955  . URINE  MICROALBUMIN  07[birth year]/1955  . COLONOSCOPY  07/24/1995  . PNA vac Low Risk Adult (1 of 2 - PCV13) 07/23/2010  . INFLUENZA VACCINE  01/14/2017  . HEMOGLOBIN A1C  03/24/2017    Vitals:   10/03/16 1526  BP: 139/76  Pulse: 77  Resp: 20  Temp: 97.5 F (36.4 C)  SpO2: 98%  Weight: 220 lb (99.8 kg)  Height: 6' (1.829 m)   Body mass index is 29.84 kg/m. Physical Exam  Constitutional: He is oriented to person, place, and time. He appears well-developed and well-nourished. No distress.  HENT:  Head: Normocephalic.  Mouth/Throat: Oropharynx is clear and moist. No oropharyngeal exudate.  Eyes: Conjunctivae and EOM are normal. Pupils are equal, round, and reactive to light. Right eye exhibits no discharge. Left eye exhibits no discharge. No scleral icterus.  Neck:  Normal range of motion. No JVD present. No thyromegaly present.  Cardiovascular: Normal rate, regular rhythm, normal heart sounds and intact distal pulses.  Exam reveals no gallop and no friction rub.   No murmur heard. Pulmonary/Chest: Effort normal. No respiratory distress. He has no wheezes. He has no rales. He exhibits no tenderness.  Coarse lung sounds to right lobes.   Abdominal: Soft. Bowel sounds are normal. He exhibits no distension. There is no tenderness. There is no rebound and no guarding.  Musculoskeletal: He exhibits no tenderness or deformity.  Unsteady gait. Moves x 4 extremities. Bilateral lower extremities trace-1+ edema.   Lymphadenopathy:    He has no cervical adenopathy.  Neurological: He is oriented to person, place, and time.  Skin: Skin is warm and dry. No rash noted. No erythema. No pallor.  Psychiatric: He has a normal mood and affect.    Labs reviewed:  Recent Labs  09/21/16 0904 09/22/16 0251 09/26/16 1958 09/27/16 0448  NA  --  130* 123* 132*  K  --  3.5 2.9* 4.4  CL  --  96* 90* 100*  CO2  --  27 24 20*  GLUCOSE  --  364* 239* 281*  BUN  --  29* 19 21*  CREATININE  --  1.37* 1.11 1.07    CALCIUM  --  8.4* 8.5* 8.7*  MG 1.6*  --  1.5* 2.1  PHOS  --   --  2.8  --     Recent Labs  09/27/16 0448  AST 48*  ALT 46  ALKPHOS 55  BILITOT 1.4*  PROT 6.8  ALBUMIN 3.0*    Recent Labs  09/21/16 0138 09/26/16 1958 09/27/16 0448  WBC 6.9 7.0 3.8  NEUTROABS  --  5.1  --   HGB 14.7 14.3 14.7  HCT 41.8 40.0 41.2  MCV 92.9 91.6 90.4  PLT 137* 102* 95*   Lab Results  Component Value Date   TSH 1.497 07/31/2015   Lab Results  Component Value Date   HGBA1C 10.0 (H) 09/22/2016   Lab Results  Component Value Date   CHOL 107 09/22/2016   HDL 25 (L) 09/22/2016   LDLCALC 31 09/22/2016   TRIG 253 (H) 09/22/2016   CHOLHDL 4.3 09/22/2016    Significant Diagnostic Results in last 30 days:  Dg Chest 2 View  Result Date: 09/26/2016 CLINICAL DATA:  Initial evaluation for acute shortness of breath with productive cough, concern for pneumonia. EXAM: CHEST  2 VIEW COMPARISON:  Prior radiograph from 09/20/2016. FINDINGS: Median sternotomy wires underlying CABG markers again noted. Mild cardiomegaly is stable. Mediastinal silhouette within normal limits. Mild elevation the left hemidiaphragm. Few scattered linear densities within the lung bases most consistent with subsegmental atelectasis. No focal infiltrates or consolidative opacity to suggest pneumonia. No pulmonary edema or pleural effusion. No pneumothorax. No acute osseous abnormality. Cervical ACDF and fixation hardware at the left shoulder partially visualized. IMPRESSION: 1. Mild bibasilar subsegmental atelectasis. Atelectasis. No findings to suggest pneumonia. 2. No other radiographic evidence for active cardiopulmonary disease. 3. Stable cardiomegaly with sequelae of prior CABG. No pulmonary edema. Electronically Signed   By: Jeannine Boga M.D.   On: 09/26/2016 20:27   Dg Chest 2 View  Result Date: 09/20/2016 CLINICAL DATA:  Chest pain. EXAM: CHEST  2 VIEW COMPARISON:  03/13/2015 is FINDINGS: The cardiac silhouette,  mediastinal and hilar contours are within normal limits and stable. Stable surgical changes from bypass surgery. The lungs demonstrate mild chronic bronchitic changes but no  infiltrates, edema or effusions. Stable biapical pleural and parenchymal scarring changes. Bilateral nipple shadows are noted. No significant bony findings. IMPRESSION: No acute cardiopulmonary findings. Electronically Signed   By: Marijo Sanes M.D.   On: 09/20/2016 14:20   Assessment/Plan 1. Uncontrolled type 2 diabetes mellitus with complication, with long-term current use of insulin Lab Results  Component Value Date   HGBA1C 10.0 (H) 09/22/2016  CBG ranging in the 160's-300's. Continue on Lantus 10 units SQ and Novolog per SSI. Request metformin to be restarted. Start metformin 500 mg Tablet twice daily. Continue to monitor CBG's  Before meals and at bedtime. On Lipitor and Xarelto. Check urine microalbumin ratio.   2. Localized edema Bilateral lower extremities trace-1+ edema. Knee high Ted hose recommended but patient refused states will elevate legs on pillows. Continue to monitor.might need low dose diuretics.  3. Abnormal breath sounds   Right lobes coarse sounds.Will obtain CXR Pa/Lat rule out PNA   Family/ staff Communication: Reviewed plan of care with patient and facility Nurse supervisor  Labs/tests ordered: CXR Pa/Lat rule out PNA  Sandrea Hughs, NP

## 2016-10-03 NOTE — Telephone Encounter (Signed)
opened in error

## 2016-10-14 ENCOUNTER — Encounter: Payer: Self-pay | Admitting: Family

## 2016-10-14 ENCOUNTER — Non-Acute Institutional Stay (SKILLED_NURSING_FACILITY): Payer: Medicare Other | Admitting: Family

## 2016-10-14 DIAGNOSIS — G894 Chronic pain syndrome: Secondary | ICD-10-CM

## 2016-10-14 DIAGNOSIS — I2510 Atherosclerotic heart disease of native coronary artery without angina pectoris: Secondary | ICD-10-CM | POA: Diagnosis not present

## 2016-10-14 DIAGNOSIS — E1165 Type 2 diabetes mellitus with hyperglycemia: Secondary | ICD-10-CM

## 2016-10-14 DIAGNOSIS — E118 Type 2 diabetes mellitus with unspecified complications: Secondary | ICD-10-CM

## 2016-10-14 DIAGNOSIS — E782 Mixed hyperlipidemia: Secondary | ICD-10-CM | POA: Diagnosis not present

## 2016-10-14 DIAGNOSIS — R2681 Unsteadiness on feet: Secondary | ICD-10-CM | POA: Diagnosis not present

## 2016-10-14 DIAGNOSIS — I10 Essential (primary) hypertension: Secondary | ICD-10-CM

## 2016-10-14 DIAGNOSIS — Z794 Long term (current) use of insulin: Secondary | ICD-10-CM

## 2016-10-14 DIAGNOSIS — I480 Paroxysmal atrial fibrillation: Secondary | ICD-10-CM

## 2016-10-14 DIAGNOSIS — IMO0002 Reserved for concepts with insufficient information to code with codable children: Secondary | ICD-10-CM

## 2016-10-14 NOTE — Progress Notes (Signed)
Location:  [facility] Room Number: 104 Place of Service:  SNF [phone])  Provider: Marlowe Sax FNP-C   PCP: [name], MD Patient Care Team: [name], MD as PCP - General (Family Medicine)  Extended Emergency Contact Information Primary Emergency Contact: Hampton Abbot Address: [address]          [place], [place] [address] [place] Phone: [phone] Relation: Sister  Code Status: Full  code  Goals of care:  Advanced Directive information Advanced Directives 10/14/2016  Does Patient Have a Medical Advance Directive? No  Would patient like information on creating a medical advance directive? No - Patient declined     Allergies  Allergen Reactions  . 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 Heparin Other (See Comments)    Reaction:  Bleeding   . Lisinopril Swelling    Chief Complaint  Patient presents with  . Discharge Note    Discharge home from SNF     HPI:  71 y.o. male seen today at [facility] for discharge home.He was here for short term rehabilitation for post hospital admission from 09/26/16-09/29/16 with presyncope thought to be secondary to dehydration with electrolyte abnormality. He received I.V fluids. He was also placed on nebuliser, oxygen and expectorant for post influenza bronchitis. Of note he was status post hospitalization 09/20/2016 due to Flu.He has a medical history of HTN, Afib,CAD post CABG in 2007,Type 2 DM, chronic pain, hyperlipidemia, GERD among other conditions.He is seen in his room today.He denies any acute issues this visit.He has had unremarkable stay in rehab. He has worked well with PT/OT now stable for discharge home.He will discharge home with outpatient Therapy at [facility] to continue with ROM, Exercise, Gait stability and muscle strengthening.He does not require any DME has own walker at home.Discharge process will be arranged by facility  social worker prior to discharge. Prescription medication will be written x 1 month then patient to follow up with PCP in 1-2 weeks.Facility staff report no new concerns.   Past Medical History:  Diagnosis Date  . 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 Cancer ([place])   . Carotid artery disease (HCC)    > 75% bilateral ICA stenoses on 01/2013 CT  . Chronic pain syndrome   . Coronary artery disease    s/p CABG ~ 2007  . GERD (gastroesophageal reflux disease)   . Heparin allergy    Bleeding  . History of tobacco use   . Hyperlipidemia   . Hypertension   . Ischemic cardiomyopathy   . Paroxysmal atrial fibrillation (HCC)    On Xarelto anticoagulation  . Type 2 diabetes mellitus (HCC)    A1C 6.8% in 01/2013    Past Surgical History:  Procedure Laterality Date  . CARDIAC CATHETERIZATION    . CORONARY ARTERY BYPASS GRAFT  2007      reports that he has quit smoking. His smoking use included Cigarettes. He has a 30.00 pack-year smoking history. He has never used smokeless tobacco. He reports that he does not drink alcohol or use drugs. Social History   Social History  . Marital status: Married    Spouse name: N/A  . Number of children: N/A  . Years of education: N/A   Occupational History  . Not on file.   Social History Main Topics  . Smoking status: Former Smoker    Packs/day: 1.00    Years: 30.00    Types:  Cigarettes  . Smokeless tobacco: Never Used     Comment: 30 pack-year history. Quit 8-9 years ago.   . Alcohol use No  . Drug use: No  . Sexual activity: Not on file   Other Topics Concern  . Not on file   Social History Narrative  . No narrative on file    Allergies  Allergen Reactions  . 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 Heparin Other (See Comments)    Reaction:  Bleeding   . Lisinopril Swelling    Pertinent  Health Maintenance Due  Topic Date Due  . FOOT EXAM  07/24/1955  . OPHTHALMOLOGY EXAM  07[birth year]24/1955  . URINE MICROALBUMIN  07/24/1955  .  COLONOSCOPY  07/24/1995  . PNA vac Low Risk Adult (1 of 2 - PCV13) 07/23/2010  . INFLUENZA VACCINE  01/14/2017  . HEMOGLOBIN A1C  03/24/2017    Medications: Allergies as of 10/14/2016      Reactions   Aspergum [aspirin] Anaphylaxis   Heparin Other (See Comments)   Reaction:  Bleeding    Lisinopril Swelling      Medication List       Accurate as of 10/14/16  3:18 PM. Always use your most recent med list.          acetaminophen 325 MG tablet Commonly known as:  TYLENOL Take 975 mg by mouth 3 (three) times daily as needed for mild pain or fever.   alum & mag hydroxide-simeth 200-200-20 MG/5ML suspension Commonly known as:  MAALOX/MYLANTA Take 30 mLs by mouth every 6 (six) hours as needed for indigestion or heartburn.   amLODipine 5 MG tablet Commonly known as:  NORVASC Take 1 tablet (5 mg total) by mouth daily.   atorvastatin 10 MG tablet Commonly known as:  LIPITOR Take 1 tablet (10 mg total) by mouth daily.   carvedilol 12.5 MG tablet Commonly known as:  COREG Take 12.5 mg by mouth every 12 (twelve) hours.   guaiFENesin-dextromethorphan 100-10 MG/5ML syrup Commonly known as:  ROBITUSSIN DM Take 5 mLs by mouth every 4 (four) hours as needed for cough.   hydrALAZINE 25 MG tablet Commonly known as:  APRESOLINE Take 1 tablet (25 mg total) by mouth 3 (three) times daily.   insulin glargine 100 UNIT/ML injection Commonly known as:  LANTUS Inject into the skin. 10 units sq daily at bedtime   loperamide 2 MG capsule Commonly known as:  IMODIUM Take 1 capsule (2 mg total) by mouth every 6 (six) hours as needed for diarrhea or loose stools.   magnesium oxide 400 MG tablet Commonly known as:  MAG-OX Take 400 mg by mouth daily.   metFORMIN 500 MG tablet Commonly known as:  GLUCOPHAGE Take 500 mg by mouth 2 (two) times daily with a meal.   morphine 15 MG tablet Commonly known as:  MSIR Take 15 mg by mouth 3 (three) times daily.   NOVOLOG 100 UNIT/ML  injection Generic drug:  insulin aspart Inject into the skin. Three times daily with meals. Sliding scale CBG <150 -0 unit, 151-250 5 units; 251-350 8 units   ondansetron 8 MG tablet Commonly known as:  ZOFRAN Take 8 mg by mouth every 8 (eight) hours as needed for nausea or vomiting.   oxyCODONE-acetaminophen 5-325 MG tablet Commonly known as:  PERCOCET Take 1 tablet by mouth every 8 (eight) hours as needed for severe pain.   pantoprazole 40 MG tablet Commonly known as:  PROTONIX Take 40 mg by mouth 2 (two) times daily.  rivaroxaban 20 MG Tabs tablet Commonly known as:  XARELTO Take 1 tablet (20 mg total) by mouth daily with supper.   saccharomyces boulardii 250 MG capsule Commonly known as:  FLORASTOR Take 1 capsule (250 mg total) by mouth 2 (two) times daily.       Review of Systems  Constitutional: Negative for activity change, appetite change, chills, fatigue and fever.  HENT: Negative for congestion, sinus pain, sinus pressure, sneezing and sore throat.   Eyes: Negative.   Respiratory: Negative for cough, chest tightness, shortness of breath and wheezing.   Cardiovascular: Positive for leg swelling. Negative for chest pain and palpitations.  Gastrointestinal: Negative for abdominal distention, abdominal pain, constipation, diarrhea, nausea and vomiting.  Endocrine: Negative for cold intolerance, heat intolerance, polydipsia, polyphagia and polyuria.  Genitourinary: Negative for dysuria, flank pain, frequency and urgency.  Musculoskeletal: Positive for back pain and gait problem.  Skin: Negative for color change, pallor and rash.  Neurological: Negative for dizziness, seizures, light-headedness and headaches.  Hematological: Does not bruise/bleed easily.  Psychiatric/Behavioral: Negative for agitation, confusion, hallucinations and sleep disturbance. The patient is not nervous/anxious.     Vitals:   10/14/16 1040  BP: 129/82  Pulse: 66  Resp: 20  Temp: 98.4 F  (36.9 C)  TempSrc: Oral  SpO2: 97%  Weight: 220 lb (99.8 kg)  Height: 6' (1.829 m)   Body mass index is 29.84 kg/m. Physical Exam  Constitutional: He is oriented to person, place, and time. He appears well-developed and well-nourished. No distress.  HENT:  Head: Normocephalic.  Mouth/Throat: Oropharynx is clear and moist. No oropharyngeal exudate.  Eyes: Conjunctivae and EOM are normal. Pupils are equal, round, and reactive to light. Right eye exhibits no discharge. Left eye exhibits no discharge. No scleral icterus.  Neck: Normal range of motion. No JVD present. No thyromegaly present.  Cardiovascular: Normal rate, regular rhythm, normal heart sounds and intact distal pulses.  Exam reveals no gallop and no friction rub.   No murmur heard. Pulmonary/Chest: Effort normal and breath sounds normal. No respiratory distress. He has no wheezes. He has no rales. He exhibits no tenderness.  Abdominal: Soft. Bowel sounds are normal. He exhibits no distension. There is no tenderness. There is no rebound and no guarding.  Genitourinary:  Genitourinary Comments: Continent   Musculoskeletal: He exhibits no tenderness or deformity.  Unsteady gait. Moves x 4 extremities. Bilateral lower extremities trace   Lymphadenopathy:    He has no cervical adenopathy.  Neurological: He is oriented to person, place, and time.  Skin: Skin is warm and dry. No rash noted. No erythema. No pallor.  Old healed surgical incision to neck and left shoulder from previous surgeries.   Psychiatric: He has a normal mood and affect.    Labs reviewed: Basic Metabolic Panel:  Recent Labs  09/21/16 0904 09/22/16 0251 09/26/16 1958 09/27/16 0448 10/02/16  NA  --  130* 123* 132* 130*  K  --  3.5 2.9* 4.4 4.4  CL  --  96* 90* 100*  --   CO2  --  27 24 20*  --   GLUCOSE  --  364* 239* 281*  --   BUN  --  29* 19 21* 15  CREATININE  --  1.37* 1.11 1.07 1.2  CALCIUM  --  8.4* 8.5* 8.7*  --   MG 1.6*  --  1.5* 2.1  --    PHOS  --   --  2.8  --   --  Liver Function Tests:  Recent Labs  09/27/16 0448 10/02/16  AST 48* 30  ALT 46 63*  ALKPHOS 55 64  BILITOT 1.4*  --   PROT 6.8  --   ALBUMIN 3.0*  --    CBC:  Recent Labs  09/21/16 0138 09/26/16 1958 09/27/16 0448 10/02/16  WBC 6.9 7.0 3.8 10.2  NEUTROABS  --  5.1  --   --   HGB 14.7 14.3 14.7 14.3  HCT 41.8 40.0 41.2 40*  MCV 92.9 91.6 90.4  --   PLT 137* 102* 95* 315   Cardiac Enzymes:  Recent Labs  09/20/16 2141 09/21/16 0138 09/26/16 1958  TROPONINI <0.03 <0.03 <0.03    Recent Labs  09/28/16 2122 09/29/16 0734 09/29/16 1127  GLUCAP 215* 275* 231*    Procedures and Imaging Studies During Stay: Dg Chest 2 View  Result Date: 09/26/2016 CLINICAL DATA:  Initial evaluation for acute shortness of breath with productive cough, concern for pneumonia. EXAM: CHEST  2 VIEW COMPARISON:  Prior radiograph from 09/20/2016. FINDINGS: Median sternotomy wires underlying CABG markers again noted. Mild cardiomegaly is stable. Mediastinal silhouette within normal limits. Mild elevation the left hemidiaphragm. Few scattered linear densities within the lung bases most consistent with subsegmental atelectasis. No focal infiltrates or consolidative opacity to suggest pneumonia. No pulmonary edema or pleural effusion. No pneumothorax. No acute osseous abnormality. Cervical ACDF and fixation hardware at the left shoulder partially visualized. IMPRESSION: 1. Mild bibasilar subsegmental atelectasis. Atelectasis. No findings to suggest pneumonia. 2. No other radiographic evidence for active cardiopulmonary disease. 3. Stable cardiomegaly with sequelae of prior CABG. No pulmonary edema. Electronically Signed   By: Jeannine Boga M.D.   On: 09/26/2016 20:27   Dg Chest 2 View  Result Date: 09/20/2016 CLINICAL DATA:  Chest pain. EXAM: CHEST  2 VIEW COMPARISON:  03/13/2015 is FINDINGS: The cardiac silhouette, mediastinal and hilar contours are within normal  limits and stable. Stable surgical changes from bypass surgery. The lungs demonstrate mild chronic bronchitic changes but no infiltrates, edema or effusions. Stable biapical pleural and parenchymal scarring changes. Bilateral nipple shadows are noted. No significant bony findings. IMPRESSION: No acute cardiopulmonary findings. Electronically Signed   By: Marijo Sanes M.D.   On: 09/20/2016 14:20   Assessment/Plan:   1. Unsteady gait   Has worked well with PT/ OT. Will discharge home with outpatient Therapy at [facility] to continue with ROM, Exercise, Gait stability and muscle strengthening.He does not require any  DME has own walker at home.Fall and safety precautions.CBC in 1-2 weeks with PCP   2. Essential hypertension B/P stable. Continue on Hydralazine, Amlodipine and carvedilol.BMP in 1-2 weeks with PCP.   3. CAD Chest pain free. Continue on carvedilol and Atorvastatin.   4. Uncontrolled type 2 diabetes mellitus with complication, with long-term current use of insulin  CBG's ranging in the 90's- low 200's. Continue on Metformin, lantus and Novolog per SSI. On Atorvastatin. Will need annual eye and foot exam with PCP. Continue to monitor Hgb A1C.   5. Atrial fibrillation HR controlled.Continue on Xarelto and carvedilol.   6. Mixed hyperlipidemia Continue on atorvastatin. Lipid panel with PCP.   7. Hypomagnesemia Recent mg level 2.0 (10/02/2016). Continue magnesium oxide supplement. Monitor mg level periodically.   8. Chronic pain syndrome Chronic back, neck and left shoulder pain. Continue on Morphine ER and Oxycodone/APAP. Wean off as tolerated.   Patient is being discharged with the following home health services:   -outpatient Therapy at [facility] to continue with ROM, Exercise, Gait stability and muscle strengthening  Patient is being discharged with the following durable medical equipment:    - None required has own walker at home.  Patient has  been advised to f/u with their PCP in 1-2 weeks to for a transitions of care visit.Social services at their facility was responsible for arranging this appointment.  Pt was provided with adequate prescriptions of noncontrolled medications to reach the scheduled appointment.For controlled substances, a limited supply was provided as appropriate for the individual patient. If the pt normally receives these medications from a pain clinic or has a contract with another physician, these medications should be received from that clinic or physician only).    Future labs/tests needed:  CBC, BMP in 1-2 weeks PCP

## 2017-05-05 ENCOUNTER — Ambulatory Visit (INDEPENDENT_AMBULATORY_CARE_PROVIDER_SITE_OTHER): Payer: Medicare Other | Admitting: Vascular Surgery

## 2017-05-05 ENCOUNTER — Encounter (INDEPENDENT_AMBULATORY_CARE_PROVIDER_SITE_OTHER): Payer: Medicare Other

## 2017-05-15 ENCOUNTER — Encounter (INDEPENDENT_AMBULATORY_CARE_PROVIDER_SITE_OTHER): Payer: Medicare Other

## 2017-05-15 ENCOUNTER — Ambulatory Visit (INDEPENDENT_AMBULATORY_CARE_PROVIDER_SITE_OTHER): Payer: Medicare Other | Admitting: Vascular Surgery

## 2019-07-16 ENCOUNTER — Emergency Department: Payer: Medicare PPO

## 2019-07-16 ENCOUNTER — Other Ambulatory Visit: Payer: Self-pay

## 2019-07-16 ENCOUNTER — Observation Stay: Admit: 2019-07-16 | Payer: Medicare PPO

## 2019-07-16 ENCOUNTER — Observation Stay
Admission: EM | Admit: 2019-07-16 | Discharge: 2019-07-16 | Disposition: A | Discharge status: Home / Self Care | Payer: Medicare PPO | Attending: Family Medicine | Admitting: Family Medicine

## 2019-07-16 DIAGNOSIS — Z79899 Other long term (current) drug therapy: Secondary | ICD-10-CM | POA: Insufficient documentation

## 2019-07-16 DIAGNOSIS — E119 Type 2 diabetes mellitus without complications: Secondary | ICD-10-CM | POA: Insufficient documentation

## 2019-07-16 DIAGNOSIS — E785 Hyperlipidemia, unspecified: Secondary | ICD-10-CM | POA: Insufficient documentation

## 2019-07-16 DIAGNOSIS — Z888 Allergy status to other drugs, medicaments and biological substances status: Secondary | ICD-10-CM | POA: Insufficient documentation

## 2019-07-16 DIAGNOSIS — R079 Chest pain, unspecified: Secondary | ICD-10-CM | POA: Diagnosis present

## 2019-07-16 DIAGNOSIS — Z20822 Contact with and (suspected) exposure to covid-19: Secondary | ICD-10-CM | POA: Diagnosis not present

## 2019-07-16 DIAGNOSIS — R0602 Shortness of breath: Secondary | ICD-10-CM

## 2019-07-16 DIAGNOSIS — Z7901 Long term (current) use of anticoagulants: Secondary | ICD-10-CM | POA: Insufficient documentation

## 2019-07-16 DIAGNOSIS — I10 Essential (primary) hypertension: Secondary | ICD-10-CM | POA: Insufficient documentation

## 2019-07-16 DIAGNOSIS — Z8249 Family history of ischemic heart disease and other diseases of the circulatory system: Secondary | ICD-10-CM | POA: Diagnosis not present

## 2019-07-16 DIAGNOSIS — Z951 Presence of aortocoronary bypass graft: Secondary | ICD-10-CM | POA: Diagnosis not present

## 2019-07-16 DIAGNOSIS — Z87891 Personal history of nicotine dependence: Secondary | ICD-10-CM | POA: Insufficient documentation

## 2019-07-16 DIAGNOSIS — K219 Gastro-esophageal reflux disease without esophagitis: Secondary | ICD-10-CM | POA: Insufficient documentation

## 2019-07-16 DIAGNOSIS — Z794 Long term (current) use of insulin: Secondary | ICD-10-CM | POA: Insufficient documentation

## 2019-07-16 DIAGNOSIS — R0600 Dyspnea, unspecified: Secondary | ICD-10-CM | POA: Diagnosis not present

## 2019-07-16 DIAGNOSIS — Z7982 Long term (current) use of aspirin: Secondary | ICD-10-CM | POA: Insufficient documentation

## 2019-07-16 DIAGNOSIS — I2510 Atherosclerotic heart disease of native coronary artery without angina pectoris: Secondary | ICD-10-CM | POA: Diagnosis not present

## 2019-07-16 DIAGNOSIS — G894 Chronic pain syndrome: Secondary | ICD-10-CM | POA: Diagnosis not present

## 2019-07-16 DIAGNOSIS — R001 Bradycardia, unspecified: Secondary | ICD-10-CM | POA: Insufficient documentation

## 2019-07-16 DIAGNOSIS — I480 Paroxysmal atrial fibrillation: Secondary | ICD-10-CM | POA: Insufficient documentation

## 2019-07-16 DIAGNOSIS — I255 Ischemic cardiomyopathy: Secondary | ICD-10-CM | POA: Insufficient documentation

## 2019-07-16 LAB — CBC WITH DIFFERENTIAL/PLATELET
Abs Immature Granulocytes: 0.03 10*3/uL (ref 0.00–0.07)
Basophils Absolute: 0.2 10*3/uL — ABNORMAL HIGH (ref 0.0–0.1)
Basophils Relative: 2 %
Eosinophils Absolute: 0.9 10*3/uL — ABNORMAL HIGH (ref 0.0–0.5)
Eosinophils Relative: 9 %
HCT: 48.4 % (ref 39.0–52.0)
Hemoglobin: 17.4 g/dL — ABNORMAL HIGH (ref 13.0–17.0)
Immature Granulocytes: 0 %
Lymphocytes Relative: 25 %
Lymphs Abs: 2.5 10*3/uL (ref 0.7–4.0)
MCH: 32.6 pg (ref 26.0–34.0)
MCHC: 36 g/dL (ref 30.0–36.0)
MCV: 90.8 fL (ref 80.0–100.0)
Monocytes Absolute: 0.8 10*3/uL (ref 0.1–1.0)
Monocytes Relative: 8 %
Neutro Abs: 5.4 10*3/uL (ref 1.7–7.7)
Neutrophils Relative %: 56 %
Platelets: 193 10*3/uL (ref 150–400)
RBC: 5.33 MIL/uL (ref 4.22–5.81)
RDW: 12.1 % (ref 11.5–15.5)
WBC: 9.8 10*3/uL (ref 4.0–10.5)
nRBC: 0 % (ref 0.0–0.2)

## 2019-07-16 LAB — COMPREHENSIVE METABOLIC PANEL
ALT: 21 U/L (ref 0–44)
AST: 18 U/L (ref 15–41)
Albumin: 4.2 g/dL (ref 3.5–5.0)
Alkaline Phosphatase: 97 U/L (ref 38–126)
Anion gap: 8 (ref 5–15)
BUN: 26 mg/dL — ABNORMAL HIGH (ref 8–23)
CO2: 27 mmol/L (ref 22–32)
Calcium: 9.8 mg/dL (ref 8.9–10.3)
Chloride: 96 mmol/L — ABNORMAL LOW (ref 98–111)
Creatinine, Ser: 1.28 mg/dL — ABNORMAL HIGH (ref 0.61–1.24)
GFR calc Af Amer: >60 mL/min (ref >60)
GFR calc non Af Amer: 55 mL/min — ABNORMAL LOW (ref >60)
Glucose, Bld: 222 mg/dL — ABNORMAL HIGH (ref 70–99)
Potassium: 3.6 mmol/L (ref 3.5–5.1)
Sodium: 131 mmol/L — ABNORMAL LOW (ref 135–145)
Total Bilirubin: 1.3 mg/dL — ABNORMAL HIGH (ref 0.3–1.2)
Total Protein: 8.3 g/dL — ABNORMAL HIGH (ref 6.5–8.1)

## 2019-07-16 LAB — LACTIC ACID, PLASMA: Lactic Acid, Venous: 1.6 mmol/L (ref 0.5–1.9)

## 2019-07-16 LAB — RESPIRATORY PANEL BY RT PCR (FLU A&B, COVID)
Influenza A by PCR: NEGATIVE
Influenza B by PCR: NEGATIVE
SARS Coronavirus 2 by RT PCR: NEGATIVE

## 2019-07-16 LAB — PROCALCITONIN: Procalcitonin: <0.1 ng/mL

## 2019-07-16 LAB — LIPID PANEL
Cholesterol: 243 mg/dL — ABNORMAL HIGH (ref 0–200)
HDL: 31 mg/dL — ABNORMAL LOW (ref >40)
LDL Cholesterol: 165 mg/dL — ABNORMAL HIGH (ref 0–99)
Total CHOL/HDL Ratio: 7.8 RATIO
Triglycerides: 237 mg/dL — ABNORMAL HIGH (ref <150)
VLDL: 47 mg/dL — ABNORMAL HIGH (ref 0–40)

## 2019-07-16 LAB — TROPONIN I (HIGH SENSITIVITY)
Troponin I (High Sensitivity): 5 ng/L (ref <18)
Troponin I (High Sensitivity): 10 ng/L (ref <18)
Troponin I (High Sensitivity): 10 ng/L (ref <18)

## 2019-07-16 LAB — FIBRINOGEN: Fibrinogen: 603 mg/dL — ABNORMAL HIGH (ref 210–475)

## 2019-07-16 LAB — HEMOGLOBIN A1C
Hgb A1c MFr Bld: 8.5 % — ABNORMAL HIGH (ref 4.8–5.6)
Mean Plasma Glucose: 197.25 mg/dL

## 2019-07-16 LAB — POC SARS CORONAVIRUS 2 AG: SARS Coronavirus 2 Ag: NEGATIVE

## 2019-07-16 LAB — BRAIN NATRIURETIC PEPTIDE: B Natriuretic Peptide: 127 pg/mL — ABNORMAL HIGH (ref 0.0–100.0)

## 2019-07-16 LAB — FIBRIN DERIVATIVES D-DIMER (ARMC ONLY): Fibrin derivatives D-dimer (ARMC): 457.74 ng/mL (FEU) (ref 0.00–499.00)

## 2019-07-16 LAB — GLUCOSE, CAPILLARY
Glucose-Capillary: 211 mg/dL — ABNORMAL HIGH (ref 70–99)
Glucose-Capillary: 182 mg/dL — ABNORMAL HIGH (ref 70–99)

## 2019-07-16 LAB — LACTATE DEHYDROGENASE: LDH: 151 U/L (ref 98–192)

## 2019-07-16 LAB — C-REACTIVE PROTEIN: CRP: 1.5 mg/dL — ABNORMAL HIGH (ref <1.0)

## 2019-07-16 LAB — ABO/RH: ABO/RH(D): A POS

## 2019-07-16 LAB — FERRITIN: Ferritin: 149 ng/mL (ref 24–336)

## 2019-07-16 IMAGING — DX DG CHEST 1V PORT
1 series · 1 of 1 positions shown · non-contrast
Comparison: Radiograph [DATE]

CLINICAL DATA: Shortness of breath, symptoms for 2 days, worse
today.

EXAM:
PORTABLE CHEST 1 VIEW

[chest ap]
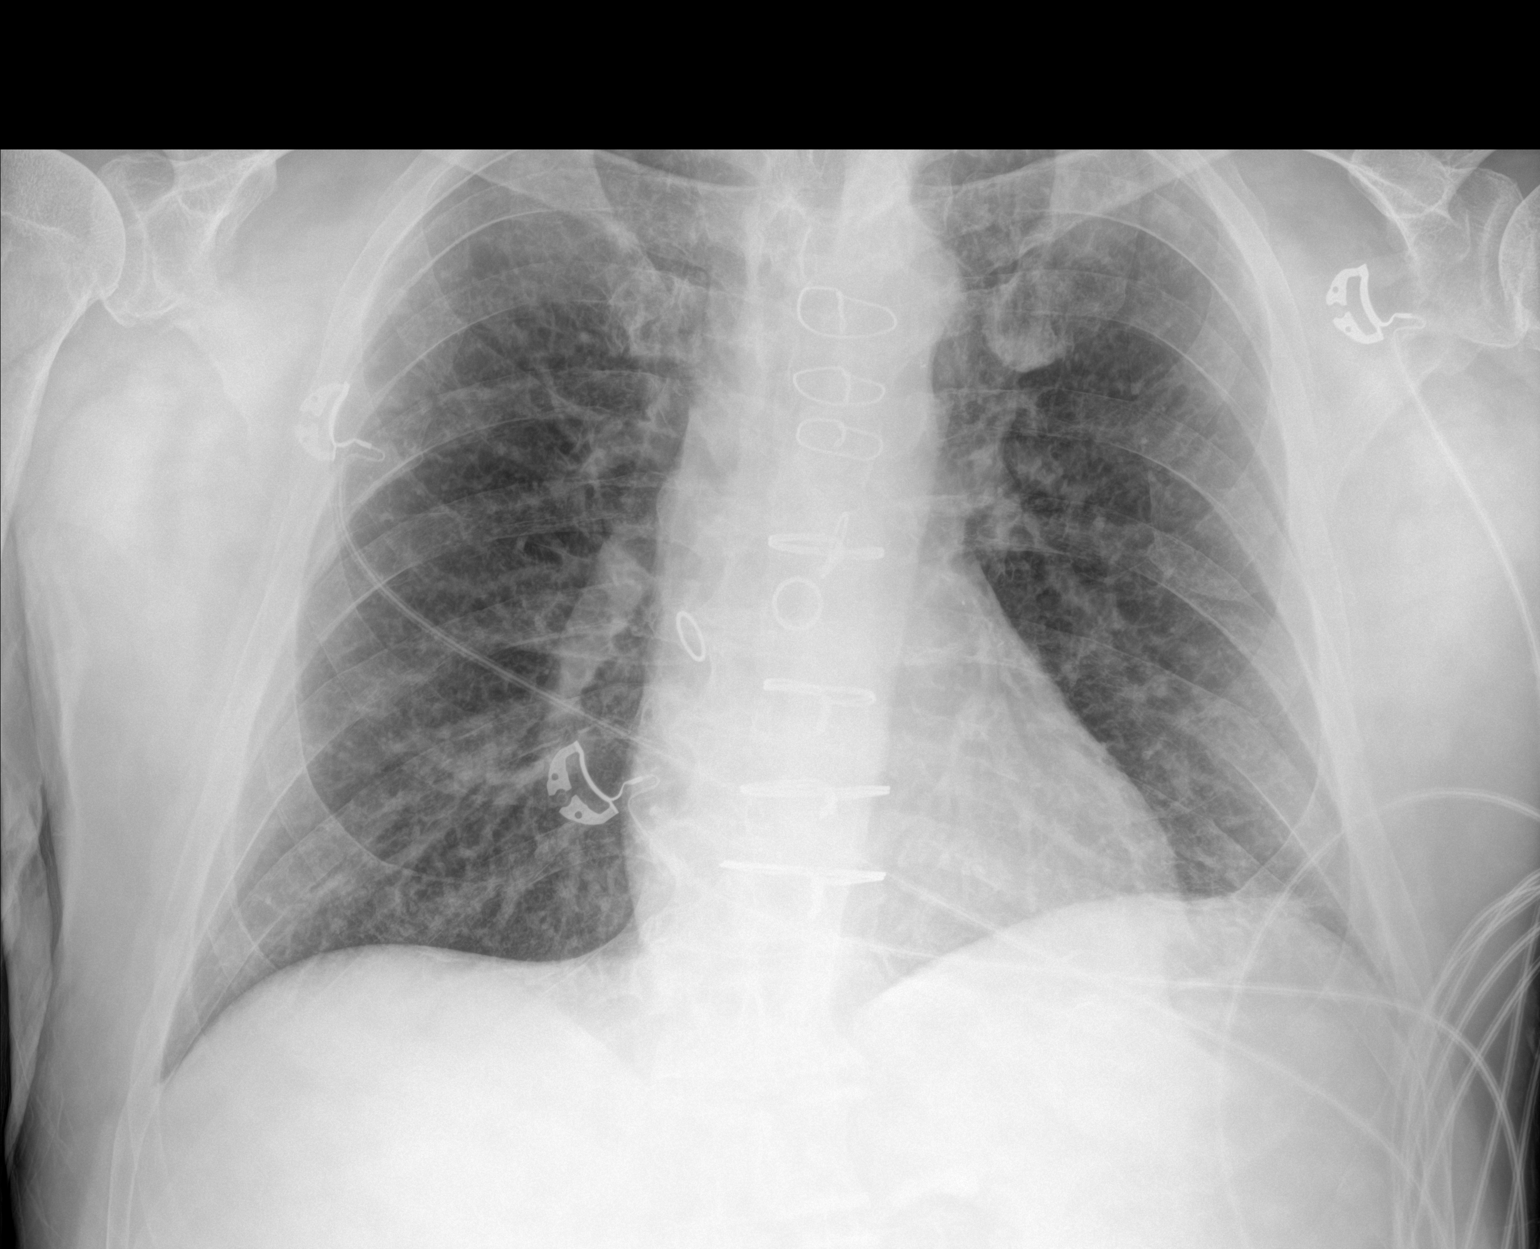

[1 of 1 positions shown; findings below may reference images not displayed]

FINDINGS: Post median sternotomy and CABG. Heart is normal in size. Unchanged
mediastinal contours. Left lung base atelectasis or scarring. No
acute airspace disease. No pulmonary edema, large pleural effusion
or pneumothorax. Surgical hardware in the lower cervical spine is
partially included.
IMPRESSION: 1. Left lung base atelectasis or scarring. No acute pulmonary
process.
2. Post CABG with normal heart size.

## 2019-07-16 IMAGING — CT CT ANGIO CHEST
2 of 6 series · 18 of 46 positions shown · IV contrast (APPLIED)
Comparison: Chest radiograph earlier same day; CT chest OLIVENE

CLINICAL DATA: Patient with tachycardia and decreased oxygen
saturation.

EXAM:
CT ANGIOGRAPHY CHEST WITH CONTRAST
TECHNIQUE: Multidetector CT imaging of the chest was performed using the
standard protocol during bolus administration of intravenous
contrast. Multiplanar CT image reconstructions and MIPs were
obtained to evaluate the vascular anatomy.
CONTRAST:  75mL OMNIPAQUE IOHEXOL 350 MG/ML SOLN

[Series 5: thins · axial · 0.75mm/px · z∈[-568,-288]mm · 15 of 308 slices shown]
[im 14/308  lung]
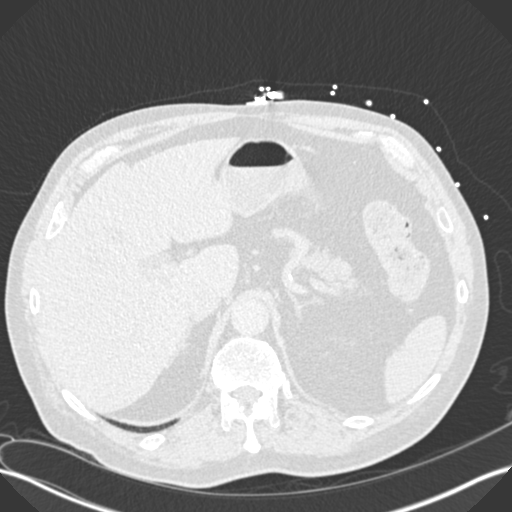
[im 41/308  soft-tissue]
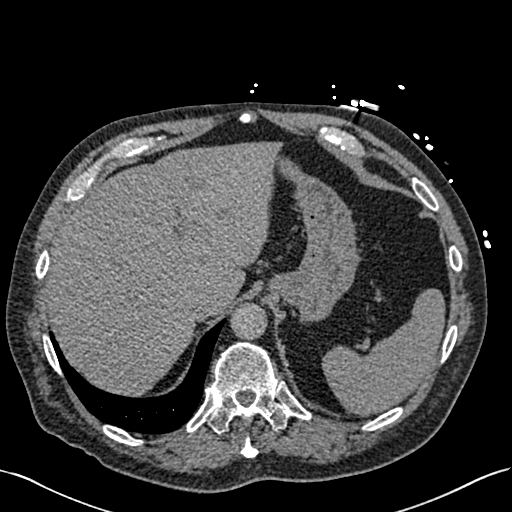
[im 54/308  lung]
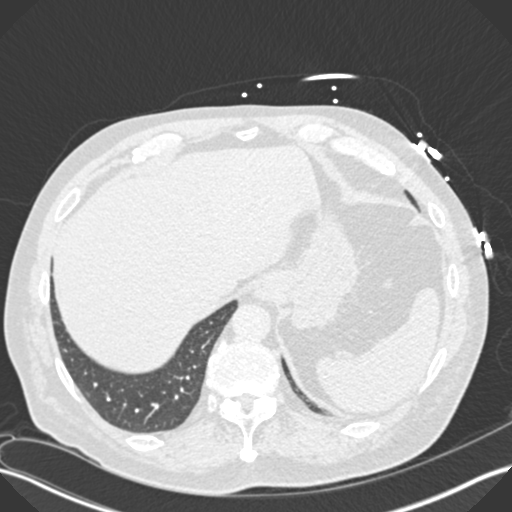
[im 81/308  soft-tissue]
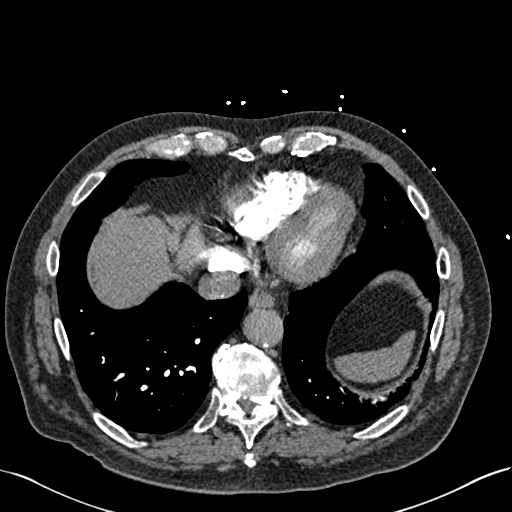
[im 94/308  lung]
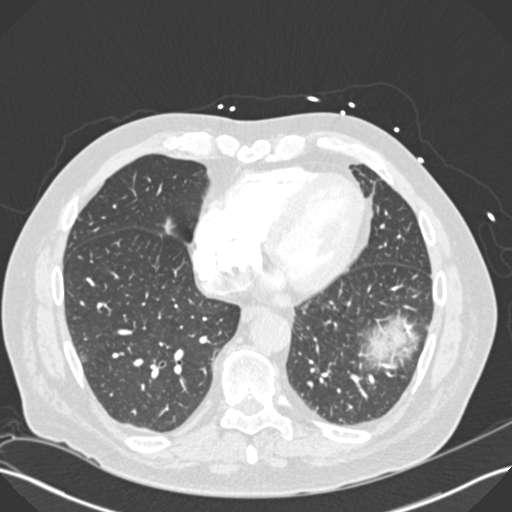
[im 121/308  soft-tissue]
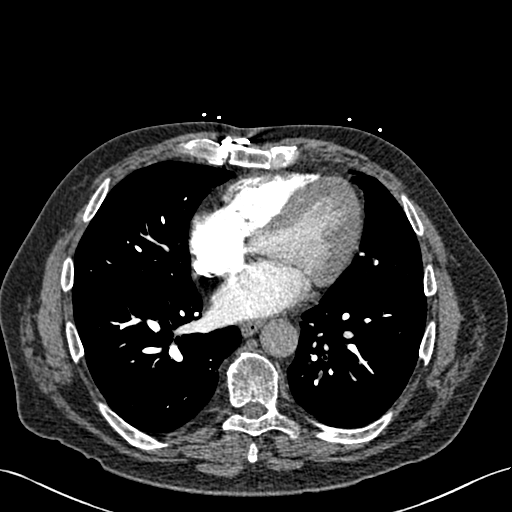
[im 134/308  lung]
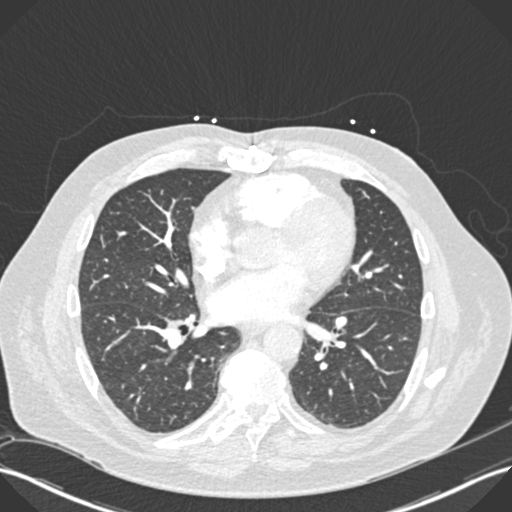
[im 161/308  soft-tissue]
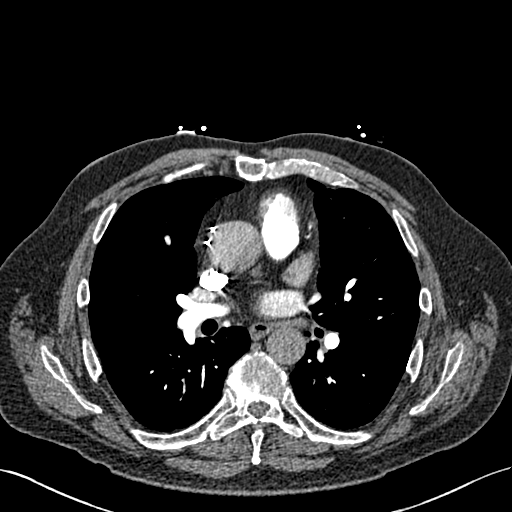
[im 174/308  lung]
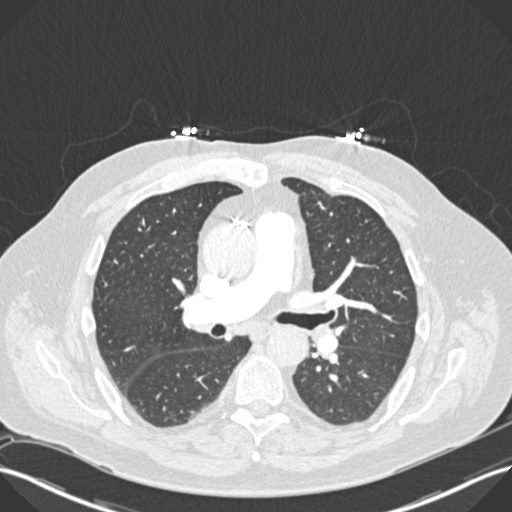
[im 187/308  soft-tissue]
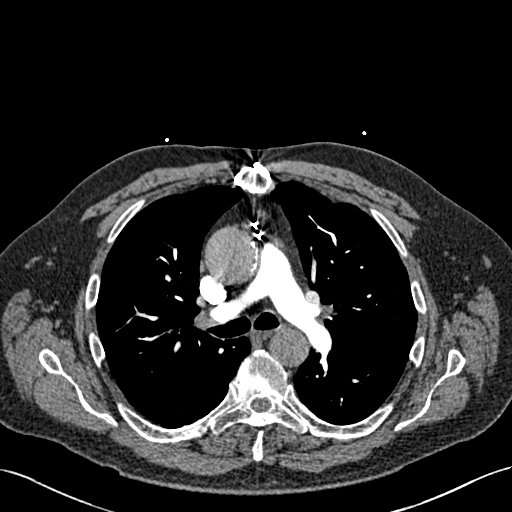
[im 214/308  lung]
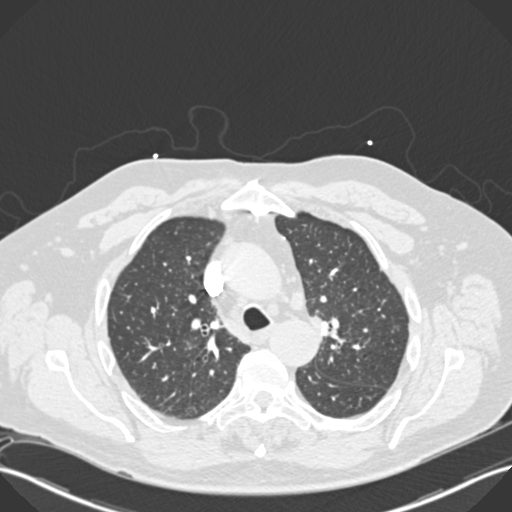
[im 227/308  soft-tissue]
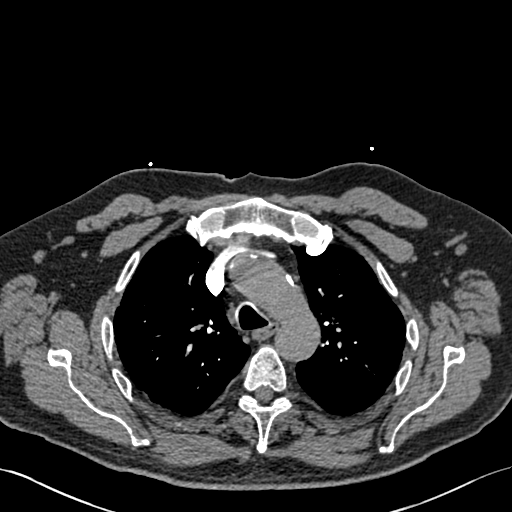
[im 254/308  lung]
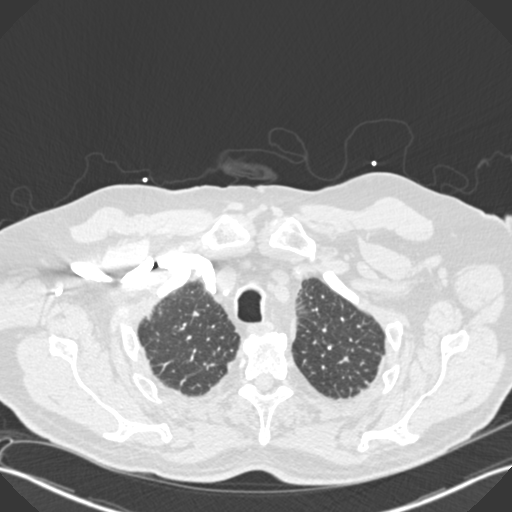
[im 267/308  soft-tissue]
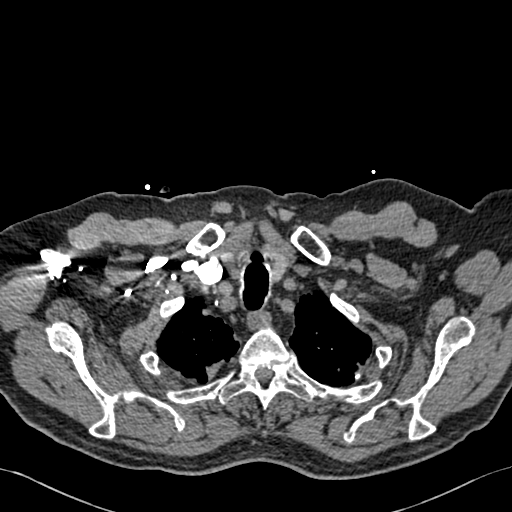
[im 294/308  lung]
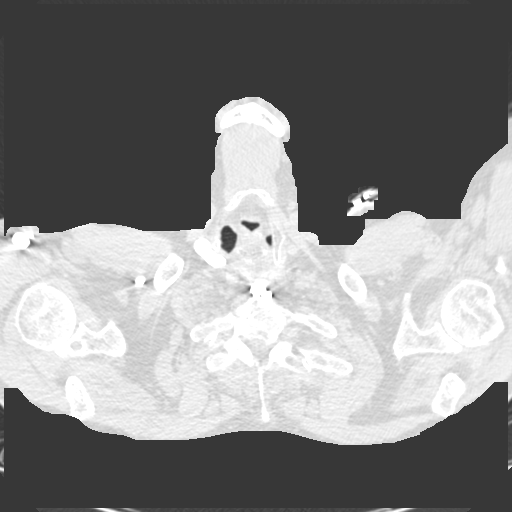

[Series 7: coronal mpr · coronal · 0.66mm/px · 3 of 87 slices shown]
[im 22/87  soft-tissue]
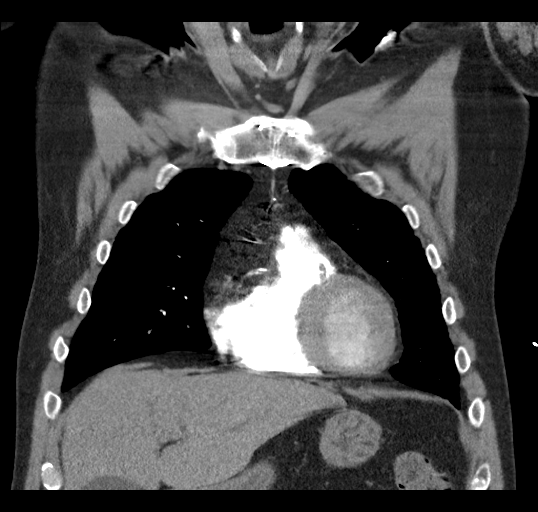
[im 44/87  soft-tissue]
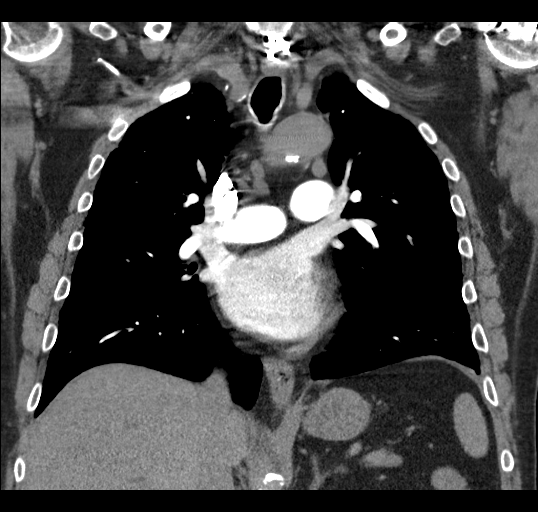
[im 65/87  soft-tissue]
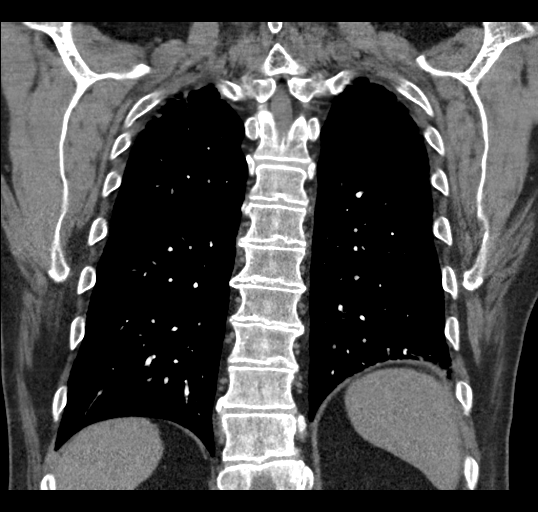

[18 of 46 positions shown; findings below may reference images not displayed]

FINDINGS: Cardiovascular: Normal heart size. No pericardial effusion. Thoracic
aortic vascular calcifications. Patient status post CABG procedure.
Adequate opacification of the pulmonary arterial system. No evidence
for acute pulmonary embolus.

Mediastinum/Nodes: Prominent subcentimeter mediastinal lymph nodes.
No axillary adenopathy. Normal appearance of the esophagus.

Lungs/Pleura: Central airways are patent. No large area pulmonary
consolidation. No pleural effusion or pneumothorax. There is a 6 mm
nodule left upper lobe (image 27; series 6).

Upper Abdomen: Unremarkable.

Musculoskeletal: Thoracic spine degenerative changes. No aggressive
or acute appearing osseous lesions.

Review of the MIP images confirms the above findings.
IMPRESSION: No evidence for acute pulmonary embolus.

No acute process within the chest.

Aortic Atherosclerosis ([Y5]-[Y5]).

There is a 6 mm nodule left upper lobe. Non-contrast chest CT at
6-12 months is recommended. If the nodule is stable at time of
repeat CT, then future CT at 18-24 months (from today's scan) is
considered optional for low-risk patients, but is recommended for
high-risk patients. This recommendation follows the consensus
statement: Guidelines for Management of Incidental Pulmonary Nodules
Detected on CT Images: From the [HOSPITAL] [Y5]; Radiology

## 2019-07-16 MED ORDER — ONDANSETRON HCL 4 MG PO TABS: 8.0000 mg | ORAL_TABLET | Freq: Three times a day (TID) | ORAL | Status: DC | PRN

## 2019-07-16 MED ORDER — ATORVASTATIN CALCIUM 10 MG PO TABS: 10.0000 mg | ORAL_TABLET | Freq: Every day | ORAL | Status: DC

## 2019-07-16 MED ORDER — INSULIN GLARGINE 100 UNIT/ML ~~LOC~~ SOLN: 10.0000 [IU] | Freq: Every day | SUBCUTANEOUS | Status: DC

## 2019-07-16 MED ORDER — FLUTICASONE PROPIONATE 50 MCG/ACT NA SUSP: 1.0000 | Freq: Two times a day (BID) | NASAL | 0 refills | Status: DC

## 2019-07-16 MED ORDER — ALPRAZOLAM 0.25 MG PO TABS: 0.2500 mg | ORAL_TABLET | Freq: Two times a day (BID) | ORAL | Status: DC | PRN

## 2019-07-16 MED ORDER — ZOLPIDEM TARTRATE 5 MG PO TABS: 5.0000 mg | ORAL_TABLET | Freq: Every evening | ORAL | Status: DC | PRN

## 2019-07-16 MED ORDER — NITROGLYCERIN 2 % TD OINT
0.5000 [in_us] | TOPICAL_OINTMENT | Freq: Once | TRANSDERMAL | Status: AC
  Administered 2019-07-16: 0.5 [in_us] via TOPICAL
  Filled 2019-07-16: qty 1

## 2019-07-16 MED ORDER — ONDANSETRON HCL 4 MG/2ML IJ SOLN: 4.0000 mg | Freq: Four times a day (QID) | INTRAMUSCULAR | Status: DC | PRN

## 2019-07-16 MED ORDER — CARVEDILOL 12.5 MG PO TABS
12.5000 mg | ORAL_TABLET | Freq: Two times a day (BID) | ORAL | Status: DC
  Filled 2019-07-16: qty 1

## 2019-07-16 MED ORDER — AMLODIPINE BESYLATE 5 MG PO TABS: 5.0000 mg | ORAL_TABLET | Freq: Every day | ORAL | Status: DC

## 2019-07-16 MED ORDER — ACETAMINOPHEN 325 MG PO TABS: 650.0000 mg | ORAL_TABLET | ORAL | Status: DC | PRN

## 2019-07-16 MED ORDER — SODIUM CHLORIDE 0.9 % IV SOLN: INTRAVENOUS | Status: DC

## 2019-07-16 MED ORDER — FLUTICASONE PROPIONATE 50 MCG/ACT NA SUSP
1.0000 | Freq: Every day | NASAL | Status: DC
  Administered 2019-07-16: 1 via NASAL
  Filled 2019-07-16: qty 16

## 2019-07-16 MED ORDER — HYDRALAZINE HCL 25 MG PO TABS: 25.0000 mg | ORAL_TABLET | Freq: Three times a day (TID) | ORAL | Status: DC

## 2019-07-16 MED ORDER — MAGNESIUM OXIDE 400 MG PO TABS: 400.0000 mg | ORAL_TABLET | Freq: Every day | ORAL | Status: DC

## 2019-07-16 MED ORDER — ALBUTEROL SULFATE (2.5 MG/3ML) 0.083% IN NEBU
2.5000 mg | INHALATION_SOLUTION | Freq: Once | RESPIRATORY_TRACT | Status: AC
  Administered 2019-07-16: 2.5 mg via RESPIRATORY_TRACT
  Filled 2019-07-16: qty 3

## 2019-07-16 MED ORDER — GUAIFENESIN-DM 100-10 MG/5ML PO SYRP: 5.0000 mL | ORAL_SOLUTION | ORAL | Status: DC | PRN

## 2019-07-16 MED ORDER — LIDOCAINE VISCOUS HCL 2 % MT SOLN
15.0000 mL | Freq: Once | OROMUCOSAL | Status: AC
  Administered 2019-07-16: 15 mL via ORAL
  Filled 2019-07-16: qty 15

## 2019-07-16 MED ORDER — RIVAROXABAN 20 MG PO TABS: 20.0000 mg | ORAL_TABLET | Freq: Every day | ORAL | Status: DC

## 2019-07-16 MED ORDER — OXYCODONE-ACETAMINOPHEN 5-325 MG PO TABS: 1.0000 | ORAL_TABLET | Freq: Three times a day (TID) | ORAL | Status: DC | PRN

## 2019-07-16 MED ORDER — PANTOPRAZOLE SODIUM 40 MG PO TBEC
40.0000 mg | DELAYED_RELEASE_TABLET | Freq: Two times a day (BID) | ORAL | Status: DC
  Administered 2019-07-16: 40 mg via ORAL
  Filled 2019-07-16: qty 1

## 2019-07-16 MED ORDER — IOHEXOL 350 MG/ML SOLN
75.0000 mL | Freq: Once | INTRAVENOUS | Status: AC | PRN
  Administered 2019-07-16: 75 mL via INTRAVENOUS

## 2019-07-16 MED ORDER — ALUM & MAG HYDROXIDE-SIMETH 200-200-20 MG/5ML PO SUSP
30.0000 mL | Freq: Once | ORAL | Status: DC
  Filled 2019-07-16: qty 30

## 2019-07-16 MED ORDER — INSULIN ASPART 100 UNIT/ML ~~LOC~~ SOLN
0.0000 [IU] | SUBCUTANEOUS | Status: DC
  Administered 2019-07-16: 3 [IU] via SUBCUTANEOUS
  Administered 2019-07-16: 2 [IU] via SUBCUTANEOUS
  Filled 2019-07-16 (×2): qty 1

## 2019-07-16 MED ORDER — SACCHAROMYCES BOULARDII 250 MG PO CAPS
250.0000 mg | ORAL_CAPSULE | Freq: Two times a day (BID) | ORAL | Status: DC
  Administered 2019-07-16: 250 mg via ORAL
  Filled 2019-07-16 (×2): qty 1

## 2019-07-16 MED ORDER — GUAIFENESIN-DM 100-10 MG/5ML PO SYRP: 5.0000 mL | ORAL_SOLUTION | ORAL | 0 refills | Status: DC | PRN

## 2019-07-16 MED ORDER — MORPHINE SULFATE 15 MG PO TABS
15.0000 mg | ORAL_TABLET | Freq: Three times a day (TID) | ORAL | Status: DC
  Administered 2019-07-16: 15 mg via ORAL
  Filled 2019-07-16: qty 1

## 2019-07-16 NOTE — Consult Note (Signed)
[facility] Cardiology  CARDIOLOGY CONSULT NOTE  Patient ID: Bryan Scott MRN: [id number] DOB/AGE: 1945/11/26 74 y.o.  Admit date: 07/16/2019 Referring Physician [facility] Primary Physician [facility] Primary Cardiologist  Reason for Consultation chest pain  HPI: 74 year old gentleman referred for evaluation of chest pain.  The patient has been in his usual state of health in the past 2 days when he started to experience nasal congestion, upper airway congestion, and chest discomfort.  Patient describes right-sided chest discomfort, nonexertional, pleuritic in nature, masturbated by cough and deep breathing.  Is concerned that he had COVID-19 sent to [facility] emergency room where he tested negative for influenza A and B and SARS coronavirus.  Admission labs notable for normal troponin of 5 and 10.  ECG reveals Dennis bradycardia at a rate of 58 bpm, new focal PVCs, and nonspecific T wave abnormalities laterally.  Patient hemodynamically stable with blood pressure of 124/80.  Patient has known coronary artery disease, status post CABG x4 10/2004 at [facility].  He underwent Cypher stent left main/LAD and bare-metal stent ramus intermedius branch 03/2005.  Most recent cardiac catheterization 08/19/2005 revealed occluded RCA, patent SVG to PDA and OM 2, and patent stent left main/LAD, with 75% stenosis ostium of ramus intermedius, treated medically.  Patient has been lost to cardiology follow-up.  Review of systems complete and found to be negative unless listed above     Past Medical History:  Diagnosis Date  . 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 Cancer ([place])   . Carotid artery disease (HCC)    > 75% bilateral ICA stenoses on 01/2013 CT  . Chronic pain syndrome   . Coronary artery disease    s/p CABG ~ 2007  . GERD (gastroesophageal reflux disease)   . Heparin allergy    Bleeding  . History of tobacco use   . Hyperlipidemia   . Hypertension   . Ischemic cardiomyopathy   . Paroxysmal atrial fibrillation  (HCC)    On Xarelto anticoagulation  . Type 2 diabetes mellitus (HCC)    A1C 6.8% in 01/2013    Past Surgical History:  Procedure Laterality Date  . CARDIAC CATHETERIZATION    . CORONARY ARTERY BYPASS GRAFT  2007    Medications Prior to Admission  Medication Sig Dispense Refill Last Dose  . amLODipine (NORVASC) 5 MG tablet Take 1 tablet (5 mg total) by mouth daily. 30 tablet 0 07/15/2019 at 2330  . carvedilol (COREG) 12.5 MG tablet Take 12.5 mg by mouth every 12 (twelve) hours.    07/15/2019 at 2330  . glipiZIDE (GLUCOTROL) 10 MG tablet Take 10 mg by mouth 2 (two) times daily before a meal.   07/15/2019 at 2330  . guaiFENesin-dextromethorphan (ROBITUSSIN DM) 100-10 MG/5ML syrup Take 5 mLs by mouth every 4 (four) hours as needed for cough. 118 mL 0 prn at prn  . hydrALAZINE (APRESOLINE) 25 MG tablet Take 1 tablet (25 mg total) by mouth 3 (three) times daily. 90 tablet 0 07/15/2019 at 2330  . morphine (MSIR) 15 MG tablet Take 15 mg by mouth 3 (three) times daily.    07/15/2019 at 2330  . ondansetron (ZOFRAN) 8 MG tablet Take 8 mg by mouth every 8 (eight) hours as needed for nausea or vomiting.    prn at prn  . pantoprazole (PROTONIX) 40 MG tablet Take 40 mg by mouth 2 (two) times daily.   07/15/2019 at 2330  . rivaroxaban (XARELTO) 20 MG TABS tablet Take 1 tablet (20 mg total) by  mouth daily with supper. 30 tablet 2 07/15/2019 at 2330  . saccharomyces boulardii (FLORASTOR) 250 MG capsule Take 1 capsule (250 mg total) by mouth 2 (two) times daily. 30 capsule 0 07/15/2019 at 2330  . acetaminophen (TYLENOL) 325 MG tablet Take 975 mg by mouth 3 (three) times daily as needed for mild pain or fever.   Not Taking at Unknown time  . alum & mag hydroxide-simeth (MAALOX/MYLANTA) 200-200-20 MG/5ML suspension Take 30 mLs by mouth every 6 (six) hours as needed for indigestion or heartburn. (Patient not taking: Reported on 07/16/2019) 355 mL 0 Not Taking at Unknown time  . atorvastatin (LIPITOR) 10 MG tablet Take 1  tablet (10 mg total) by mouth daily. (Patient not taking: Reported on 07/16/2019) 30 tablet 0 Not Taking at Unknown time  . insulin aspart (NOVOLOG) 100 UNIT/ML injection Inject into the skin. Three times daily with meals. Sliding scale CBG <150 -0 unit, 151-250 5 units; 251-350 8 units   Not Taking at Unknown time  . insulin glargine (LANTUS) 100 UNIT/ML injection Inject into the skin. 10 units sq daily at bedtime   Not Taking at Unknown time  . loperamide (IMODIUM) 2 MG capsule Take 1 capsule (2 mg total) by mouth every 6 (six) hours as needed for diarrhea or loose stools. (Patient not taking: Reported on 07/16/2019) 30 capsule 0 Not Taking at Unknown time  . magnesium oxide (MAG-OX) 400 MG tablet Take 400 mg by mouth daily.    Not Taking at Unknown time  . metFORMIN (GLUCOPHAGE) 500 MG tablet Take 500 mg by mouth 2 (two) times daily with a meal.   Not Taking at Unknown time  . oxyCODONE-acetaminophen (PERCOCET) 5-325 MG tablet Take 1 tablet by mouth every 8 (eight) hours as needed for severe pain. (Patient not taking: Reported on 07/16/2019) 20 tablet 0 Not Taking at Unknown time   Social History   Socioeconomic History  . Marital status: Married    Spouse name: Not on file  . Number of children: Not on file  . Years of education: Not on file  . Highest education level: Not on file  Occupational History  . Not on file  Tobacco Use  . Smoking status: Former Smoker    Packs/day: 1.00    Years: 30.00    Pack years: 30.00    Types: Cigarettes  . Smokeless tobacco: Never Used  . Tobacco comment: 30 pack-year history. Quit 8-9 years ago.   Substance and Sexual Activity  . Alcohol use: No  . Drug use: No  . Sexual activity: Not on file  Other Topics Concern  . Not on file  Social History Narrative  . Not on file   Social Determinants of Health   Financial Resource Strain:   . Difficulty of Paying Living Expenses: Not on file  Food Insecurity:   . Worried About Charity fundraiser in  the Last Year: Not on file  . Ran Out of Food in the Last Year: Not on file  Transportation Needs:   . Lack of Transportation (Medical): Not on file  . Lack of Transportation (Non-Medical): Not on file  Physical Activity:   . Days of Exercise per Week: Not on file  . Minutes of Exercise per Session: Not on file  Stress:   . Feeling of Stress : Not on file  Social Connections:   . Frequency of Communication with Friends and Family: Not on file  . Frequency of Social Gatherings with Friends and Family:  Not on file  . Attends Religious Services: Not on file  . Active Member of Clubs or Organizations: Not on file  . Attends Archivist Meetings: Not on file  . Marital Status: Not on file  Intimate Partner Violence:   . Fear of Current or Ex-Partner: Not on file  . Emotionally Abused: Not on file  . Physically Abused: Not on file  . Sexually Abused: Not on file    Family History  Problem Relation Age of Onset  . Heart disease Mother 2       Deceased  . CVA Mother   . Seizures Father 61       Deceased from complications with seizures      Review of systems complete and found to be negative unless listed above      PHYSICAL EXAM  General: Well developed, well nourished, in no acute distress HEENT:  Normocephalic and atramatic Neck:  No JVD.  Lungs: Clear bilaterally to auscultation and percussion. Heart: HRRR . Normal S1 and S2 without gallops or murmurs.  Abdomen: Bowel sounds are positive, abdomen soft and non-tender  Msk:  Back normal, normal gait. Normal strength and tone for age. Extremities: No clubbing, cyanosis or edema.   Neuro: Alert and oriented X 3. Psych:  Good affect, responds appropriately  Labs:   Lab Results  Component Value Date   WBC 9.8 07/16/2019   HGB 17.4 (H) 07/16/2019   HCT 48.4 07/16/2019   MCV 90.8 07/16/2019   PLT 193 07/16/2019    Recent Labs  Lab 07/16/19 0127  NA 131*  K 3.6  CL 96*  CO2 27  BUN 26*  CREATININE  1.28*  CALCIUM 9.8  PROT 8.3*  BILITOT 1.3*  ALKPHOS 97  ALT 21  AST 18  GLUCOSE 222*   Lab Results  Component Value Date   CKTOTAL 103 02/15/2013   CKMB 4.6 (H) 10/22/2013   TROPONINI <0.03 09/26/2016    Lab Results  Component Value Date   CHOL 243 (H) 07/16/2019   CHOL 107 09/22/2016   CHOL 141 03/14/2015   Lab Results  Component Value Date   HDL 31 (L) 07/16/2019   HDL 25 (L) 09/22/2016   HDL 36 (L) 03/14/2015   Lab Results  Component Value Date   LDLCALC 165 (H) 07/16/2019   LDLCALC 31 09/22/2016   LDLCALC 63 03/14/2015   Lab Results  Component Value Date   TRIG 237 (H) 07/16/2019   TRIG 253 (H) 09/22/2016   TRIG 211 (H) 03/14/2015   Lab Results  Component Value Date   CHOLHDL 7.8 07/16/2019   CHOLHDL 4.3 09/22/2016   CHOLHDL 3.9 03/14/2015   No results found for: LDLDIRECT    Radiology: CT Angio Chest PE W/Cm &/Or Wo Cm  Result Date: 07/16/2019 CLINICAL DATA:  Patient with tachycardia and decreased oxygen saturation. EXAM: CT ANGIOGRAPHY CHEST WITH CONTRAST TECHNIQUE: Multidetector CT imaging of the chest was performed using the standard protocol during bolus administration of intravenous contrast. Multiplanar CT image reconstructions and MIPs were obtained to evaluate the vascular anatomy. CONTRAST:  30mL OMNIPAQUE IOHEXOL 350 MG/ML SOLN COMPARISON:  Chest radiograph earlier same day; CT chest March 13, 2015 FINDINGS: Cardiovascular: Normal heart size. No pericardial effusion. Thoracic aortic vascular calcifications. Patient status post CABG procedure. Adequate opacification of the pulmonary arterial system. No evidence for acute pulmonary embolus. Mediastinum/Nodes: Prominent subcentimeter mediastinal lymph nodes. No axillary adenopathy. Normal appearance of the esophagus. Lungs/Pleura: Central airways are patent. No large  area pulmonary consolidation. No pleural effusion or pneumothorax. There is a 6 mm nodule left upper lobe (image 27; series 6). Upper  Abdomen: Unremarkable. Musculoskeletal: Thoracic spine degenerative changes. No aggressive or acute appearing osseous lesions. Review of the MIP images confirms the above findings. IMPRESSION: No evidence for acute pulmonary embolus. No acute process within the chest. Aortic Atherosclerosis (ICD10-I70.0). There is a 6 mm nodule left upper lobe. Non-contrast chest CT at 6-12 months is recommended. If the nodule is stable at time of repeat CT, then future CT at 18-24 months (from today's scan) is considered optional for low-risk patients, but is recommended for high-risk patients. This recommendation follows the consensus statement: Guidelines for Management of Incidental Pulmonary Nodules Detected on CT Images: From the Fleischner Society 2017; Radiology 2017; 284:228-243. Electronically Signed   By: Lovey Newcomer M.D.   On: 07/16/2019 05:31   DG Chest Port 1 View  Result Date: 07/16/2019 CLINICAL DATA:  Shortness of breath, symptoms for 2 days, worse today. EXAM: PORTABLE CHEST 1 VIEW COMPARISON:  Radiograph 09/26/2016 FINDINGS: Post median sternotomy and CABG. Heart is normal in size. Unchanged mediastinal contours. Left lung base atelectasis or scarring. No acute airspace disease. No pulmonary edema, large pleural effusion or pneumothorax. Surgical hardware in the lower cervical spine is partially included. IMPRESSION: 1. Left lung base atelectasis or scarring. No acute pulmonary process. 2. Post CABG with normal heart size. Electronically Signed   By: Keith Rake M.D.   On: 07/16/2019 01:36    EKG: Sinus bradycardia at a rate of 58 bpm, unifocal PVCs, nonspecific T wave abnormalities  ASSESSMENT AND PLAN:   1.  Pleuritic chest pain, nonexertional, atypical, sounds noncardiac, normal high-sensitivity troponin ( 5, 10 ) 2.  Coronary artery disease, status post CABG, status post multiple coronary stents, lost to follow-up 3.  Sinus bradycardia, of uncertain clinical significance, hemodynamically  stable 4.  Acute sinusitis/bronchitis  Recommendations  1.  Agree with overall current therapy 2.  Defer full dose anticoagulation 3.  Continue carvedilol at current dose for now 4.  2D echocardiogram 5.  Defer functional study 6.  We will follow-up as outpatient in 1 week  Signed: Isaias Cowman MD,PhD, [facility] 07/16/2019, 8:52 AM

## 2019-07-16 NOTE — Progress Notes (Addendum)
Pt complaining of not being able to breath due to stuck up nose. Notify prime. Will continue to monitor.  Update: Angelina Pih will place order. Will continue to monitor.

## 2019-07-16 NOTE — ED Notes (Signed)
Nitropaste removed from patient's chest per MD [facility]

## 2019-07-16 NOTE — Progress Notes (Signed)
Patient requested Afrin for nasal stuffiness as he uses at home.  Flonase has been started on patient and request nursing for patient education regarding the use of stimulant nasal decongestants are contraindicated with heart coidtion and hypertension.

## 2019-07-16 NOTE — ED Notes (Signed)
Patient ambulated with pulse oximetry monitor per MD request. Patient's oxygen saturation decreased from 99% to 97%. Patient's respirations increased from 19 to 27. Patient's heart rate increased from 50 to 72. Patient became dizzy and unsteady on feet. Patient placed back in bed. MD informed.

## 2019-07-16 NOTE — ED Triage Notes (Signed)
Short of breath since Wednesday, worse today.

## 2019-07-16 NOTE — ED Notes (Signed)
Patient transported to CT 

## 2019-07-16 NOTE — H&P (Signed)
[place] [facility] [place] NAME: Bryan Scott    MR#:  [id number]  DATE OF BIRTH:  [birth year]  DATE OF ADMISSION:  07/16/2019  PRIMARY CARE PHYSICIAN: [name], MD   REQUESTING/REFERRING PHYSICIAN: Lurline Hare, MD CHIEF COMPLAINT:   Chief Complaint  Patient presents with  . Shortness of Breath    HISTORY OF PRESENT ILLNESS:  Bryan Scott  is a 74 y.o. Caucasian male with a known history of multiple medical problems that are mentioned below, including hypertension, dyslipidemia and coronary artery disease status post CABG as well as paroxysmal atrial fibrillation, who presented to the emergency room with acute onset of worsening dyspnea since Wednesday with associated right-sided chest pain felt as tightness with lightheadedness and dizziness with no palpitations.  He denies any nausea or vomiting or diaphoresis.  No syncope.  No paresthesias or focal muscle weakness.  He denied any cough or wheezing or hemoptysis or recent sick exposure or exposure to COVID-19.  Upon presentation to the emergency room heart rate was 46-47 with a blood pressure 138/84 and otherwise normal vital signs.  Labs revealed a BUN of 26 and creatinine 1.28, potassium 3.6 and sodium 131, chloride 96.  Blood glucose was 222 with total bili 1.3.  LDH was 151 1 BNP of 127 and high-sensitivity troponin I 5 and later 10.  Ferritin was 149 procalcitonin less than 0.1.  CBC showed hemoconcentration.  Fibrinogen was 603 and fibrin derivatives D-dimer was only 457.74.  The patient had 2 blood cultures.  Chest x-ray showed left lung base atelectasis or scarring and midline sternotomy., EKG showed sinus bradycardia with rate of 48 with PVCs and prolonged PR interval and T wave inversion laterally with Q waves in lead III.  Chest CTA was ordered and came back negative for PE.  It showed 6 mm nodule in the left upper lobe that will need follow-up with noncontrast chest CT in 6 to 12 months and if stable future CT  in 18 to 24 months.  The patient was given nebulizer albuterol and was ordered definitely Nitropaste that was held though given his bradycardia.  He will be admitted to an observation telemetry bed for further evaluation and monitoring. PAST MEDICAL HISTORY:   Past Medical History:  Diagnosis Date  . 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 Cancer ([place])   . Carotid artery disease (HCC)    > 75% bilateral ICA stenoses on 01/2013 CT  . Chronic pain syndrome   . Coronary artery disease    s/p CABG ~ 2007  . GERD (gastroesophageal reflux disease)   . Heparin allergy    Bleeding  . History of tobacco use   . Hyperlipidemia   . Hypertension   . Ischemic cardiomyopathy   . Paroxysmal atrial fibrillation (HCC)    On Xarelto anticoagulation  . Type 2 diabetes mellitus ([place])    A1C 6.8% in 01/2013    PAST SURGICAL HISTORY:   Past Surgical History:  Procedure Laterality Date  . CARDIAC CATHETERIZATION    . CORONARY ARTERY BYPASS GRAFT  2007    SOCIAL HISTORY:   Social History   Tobacco Use  . Smoking status: Former Smoker    Packs/day: 1.00    Years: 30.00    Pack years: 30.00    Types: Cigarettes  . Smokeless tobacco: Never Used  . Tobacco comment: 30 pack-year history. Quit 8-9 years ago.   Substance Use Topics  . Alcohol use: No  FAMILY HISTORY:   Family History  Problem Relation Age of Onset  . Heart disease Mother 35       Deceased  . CVA Mother   . Seizures Father 73       Deceased from complications with seizures    DRUG ALLERGIES:   Allergies  Allergen Reactions  . 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 Heparin Other (See Comments)    Reaction:  Bleeding   . Lisinopril Swelling    REVIEW OF SYSTEMS:   ROS As per history of present illness. All pertinent systems were reviewed above. Constitutional,  HEENT, cardiovascular, respiratory, GI, GU, musculoskeletal, neuro, psychiatric, endocrine,  integumentary and hematologic systems were reviewed and  are otherwise  negative/unremarkable except for positive findings mentioned above in the HPI.   MEDICATIONS AT HOME:   Prior to Admission medications   Medication Sig Start Date End Date Taking? Authorizing Provider  acetaminophen (TYLENOL) 325 MG tablet Take 975 mg by mouth 3 (three) times daily as needed for mild pain or fever.    [provider]  alum & mag hydroxide-simeth (MAALOX/MYLANTA) 200-200-20 MG/5ML suspension Take 30 mLs by mouth every 6 (six) hours as needed for indigestion or heartburn. 03/16/15   Gouru, Illene Silver, MD  amLODipine (NORVASC) 5 MG tablet Take 1 tablet (5 mg total) by mouth daily. 03/16/15   Gouru, Illene Silver, MD  atorvastatin (LIPITOR) 10 MG tablet Take 1 tablet (10 mg total) by mouth daily. 08/01/15   Dustin Flock, MD  carvedilol (COREG) 12.5 MG tablet Take 12.5 mg by mouth every 12 (twelve) hours.     [provider]  guaiFENesin-dextromethorphan (ROBITUSSIN DM) 100-10 MG/5ML syrup Take 5 mLs by mouth every 4 (four) hours as needed for cough. 09/24/16   Dustin Flock, MD  hydrALAZINE (APRESOLINE) 25 MG tablet Take 1 tablet (25 mg total) by mouth 3 (three) times daily. 03/16/15   Gouru, Illene Silver, MD  insulin aspart (NOVOLOG) 100 UNIT/ML injection Inject into the skin. Three times daily with meals. Sliding scale CBG <150 -0 unit, 151-250 5 units; 251-350 8 units    [provider]  insulin glargine (LANTUS) 100 UNIT/ML injection Inject into the skin. 10 units sq daily at bedtime    [provider]  loperamide (IMODIUM) 2 MG capsule Take 1 capsule (2 mg total) by mouth every 6 (six) hours as needed for diarrhea or loose stools. 09/24/16   Dustin Flock, MD  magnesium oxide (MAG-OX) 400 MG tablet Take 400 mg by mouth daily.     [provider]  metFORMIN (GLUCOPHAGE) 500 MG tablet Take 500 mg by mouth 2 (two) times daily with a meal.    [provider]  morphine (MSIR) 15 MG tablet Take 15 mg by mouth 3 (three) times daily.      [provider]  ondansetron (ZOFRAN) 8 MG tablet Take 8 mg by mouth every 8 (eight) hours as needed for nausea or vomiting.     [provider]  oxyCODONE-acetaminophen (PERCOCET) 5-325 MG tablet Take 1 tablet by mouth every 8 (eight) hours as needed for severe pain. 08/01/15   Dustin Flock, MD  pantoprazole (PROTONIX) 40 MG tablet Take 40 mg by mouth 2 (two) times daily.    [provider]  rivaroxaban (XARELTO) 20 MG TABS tablet Take 1 tablet (20 mg total) by mouth daily with supper. 09/22/16   Dustin Flock, MD  saccharomyces boulardii (FLORASTOR) 250 MG capsule Take 1 capsule (250 mg total) by mouth 2 (two) times daily.  03/16/15   Nicholes Mango, MD      VITAL SIGNS:  Blood pressure 122/68, pulse (!) 48, temperature 98.8 F (37.1 C), temperature source Rectal, resp. rate 11, height 6' (1.829 m), weight 90.7 kg, SpO2 100 %.  PHYSICAL EXAMINATION:  Physical Exam  GENERAL:  74 y.o.-year-old Caucasian male patient lying in the bed with no acute distress.  EYES: Pupils equal, round, reactive to light and accommodation. No scleral icterus. Extraocular muscles intact.  HEENT: Head atraumatic, normocephalic. Oropharynx and nasopharynx clear.  NECK:  Supple, no jugular venous distention. No thyroid enlargement, no tenderness.  LUNGS: Normal breath sounds bilaterally, no wheezing, rales,rhonchi or crepitation. No use of accessory muscles of respiration.  CARDIOVASCULAR: Regular rate and rhythm, S1, S2 normal. No murmurs, rubs, or gallops.  ABDOMEN: Soft, nondistended, nontender. Bowel sounds present. No organomegaly or mass.  EXTREMITIES: No pedal edema, cyanosis, or clubbing.  NEUROLOGIC: Cranial nerves II through XII are intact. Muscle strength 5/5 in all extremities. Sensation intact. Gait not checked.  PSYCHIATRIC: The patient is alert and oriented x 3.  Normal affect and good eye contact. SKIN: No obvious rash, lesion, or ulcer.   LABORATORY PANEL:    CBC Recent Labs  Lab 07/16/19 0127  WBC 9.8  HGB 17.4*  HCT 48.4  PLT 193   ------------------------------------------------------------------------------------------------------------------  Chemistries  Recent Labs  Lab 07/16/19 0127  NA 131*  K 3.6  CL 96*  CO2 27  GLUCOSE 222*  BUN 26*  CREATININE 1.28*  CALCIUM 9.8  AST 18  ALT 21  ALKPHOS 97  BILITOT 1.3*   ------------------------------------------------------------------------------------------------------------------  Cardiac Enzymes No results for input(s): TROPONINI in the last 168 hours. ------------------------------------------------------------------------------------------------------------------  RADIOLOGY:  DG Chest Port 1 View  Result Date: 07/16/2019 CLINICAL DATA:  Shortness of breath, symptoms for 2 days, worse today. EXAM: PORTABLE CHEST 1 VIEW COMPARISON:  Radiograph 09/26/2016 FINDINGS: Post median sternotomy and CABG. Heart is normal in size. Unchanged mediastinal contours. Left lung base atelectasis or scarring. No acute airspace disease. No pulmonary edema, large pleural effusion or pneumothorax. Surgical hardware in the lower cervical spine is partially included. IMPRESSION: 1. Left lung base atelectasis or scarring. No acute pulmonary process. 2. Post CABG with normal heart size. Electronically Signed   By: Keith Rake M.D.   On: 07/16/2019 01:36      IMPRESSION AND PLAN:   1.  Dyspnea with right-sided chest pain and bradycardia.  This could be symptomatic bradycardia.  Patient will be admitted to an observation telemetry bed.  We will follow serial cardiac enzymes.  Will obtain a 2D echo and a cardiology consultation with Dr. Saralyn Pilar.  I notified him regarding the patient.  We will hold off his Coreg.  2.  Type 2 diabetes mellitus.  We will place the patient on supplemental coverage with NovoLog and hold off his Glucotrol and Metformin.  3.  Dyslipidemia.  We will continue  statin therapy.  4.  Hypertension.  We will continue hydralazine and cautiously resume amlodipine.  5.  Paroxysmal atrial fibrillation.  We will continue Xarelto.  6.  GERD.  We will continue PPI therapy.  7.  DVT prophylaxis.  Xarelto discontinued.   All the records are reviewed and case discussed with ED provider. The plan of care was discussed in details with the patient (and family). I answered all questions. The patient agreed to proceed with the above mentioned plan. Further management will depend upon hospital course.   CODE STATUS: Full code  TOTAL TIME TAKING  CARE OF THIS PATIENT: 50 minutes.    Christel Mormon M.D on 07/16/2019 at 4:30 AM  [facility]   From 7 PM-7 AM, contact night-coverage [email or web address]  CC: Primary care physician; [name], MD   Note: This dictation was prepared with Dragon dictation along with smaller phrase technology. Any transcriptional errors that result from this process are unintentional.

## 2019-07-16 NOTE — Care Management Obs Status (Signed)
[place] NOTIFICATION   Patient Details  Name: Bryan Scott MRN: [id number] Date of Birth: 05-04-1946   Medicare Observation Status Notification Given:  Other (see comment)(Contacted pt's sister due to inability to meet with patient.)    Trecia Rogers, LCSW 07/16/2019, 1:23 PM

## 2019-07-16 NOTE — ED Provider Notes (Signed)
[facility] Emergency Department Provider Note   ____________________________________________   First MD Initiated Contact with Patient 07/16/19 [phone]     (approximate)  I have reviewed the triage vital signs and the nursing notes.   HISTORY  Chief Complaint Shortness of Breath    HPI Bryan Scott is a 74 y.o. male who presents to the ED from home with a chief complaint of shortness of breath x3 days.  Patient has a history of CAD status post CABG, hypertension, ischemic cardiomyopathy, paroxysmal A. fib on Xarelto who complains of cough and shortness of breath for the past 3 days. + Covid exposure.  Denies fever, chest pain, abdominal pain, nausea, vomiting or diarrhea.       Past Medical History:  Diagnosis Date  . 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 Cancer ([place])   . Carotid artery disease (HCC)    > 75% bilateral ICA stenoses on 01/2013 CT  . Chronic pain syndrome   . Coronary artery disease    s/p CABG ~ 2007  . GERD (gastroesophageal reflux disease)   . Heparin allergy    Bleeding  . History of tobacco use   . Hyperlipidemia   . Hypertension   . Ischemic cardiomyopathy   . Paroxysmal atrial fibrillation (HCC)    On Xarelto anticoagulation  . Type 2 diabetes mellitus ([place])    A1C 6.8% in 01/2013    Patient Active Problem List   Diagnosis Date Noted  . Hyponatremia 09/26/2016  . Chest pain 09/22/2016  . 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  . Abnormal EKG 09/21/2016  . Chronic pain 09/21/2016  . PAF (paroxysmal atrial fibrillation) ([place]) 09/21/2016  . Essential hypertension 05/02/2016  . Carotid stenosis 05/02/2016  . CAD in native artery   . Atypical chest pain   . Uncontrolled type 2 diabetes mellitus with complication, with long-term current use of insulin ([place])   . Hyperlipidemia   . Pain of left upper extremity   . Angina pectoris ([place])   . Abdominal pain   . Chest pain, central 03/13/2015  .  Hypertensive urgency 03/13/2015    Past Surgical History:  Procedure Laterality Date  . CARDIAC CATHETERIZATION    . CORONARY ARTERY BYPASS GRAFT  2007    Prior to Admission medications   Medication Sig Start Date End Date Taking? Authorizing Provider  amLODipine (NORVASC) 5 MG tablet Take 1 tablet (5 mg total) by mouth daily. 03/16/15  Yes Gouru, Illene Silver, MD  carvedilol (COREG) 12.5 MG tablet Take 12.5 mg by mouth every 12 (twelve) hours.    Yes [provider]  glipiZIDE (GLUCOTROL) 10 MG tablet Take 10 mg by mouth 2 (two) times daily before a meal.   Yes [provider]  guaiFENesin-dextromethorphan (ROBITUSSIN DM) 100-10 MG/5ML syrup Take 5 mLs by mouth every 4 (four) hours as needed for cough. 09/24/16  Yes Dustin Flock, MD  hydrALAZINE (APRESOLINE) 25 MG tablet Take 1 tablet (25 mg total) by mouth 3 (three) times daily. 03/16/15  Yes Gouru, Illene Silver, MD  morphine (MSIR) 15 MG tablet Take 15 mg by mouth 3 (three) times daily.    Yes [provider]  ondansetron (ZOFRAN) 8 MG tablet Take 8 mg by mouth every 8 (eight) hours as needed for nausea or vomiting.    Yes [provider]  pantoprazole (PROTONIX) 40 MG tablet Take 40 mg by mouth 2 (two) times daily.   Yes [provider]  rivaroxaban (  XARELTO) 20 MG TABS tablet Take 1 tablet (20 mg total) by mouth daily with supper. 09/22/16  Yes Dustin Flock, MD  saccharomyces boulardii (FLORASTOR) 250 MG capsule Take 1 capsule (250 mg total) by mouth 2 (two) times daily. 03/16/15  Yes Gouru, Illene Silver, MD  acetaminophen (TYLENOL) 325 MG tablet Take 975 mg by mouth 3 (three) times daily as needed for mild pain or fever.    [provider]  alum & mag hydroxide-simeth (MAALOX/MYLANTA) 200-200-20 MG/5ML suspension Take 30 mLs by mouth every 6 (six) hours as needed for indigestion or heartburn. Patient not taking: Reported on 07/16/2019 03/16/15   Nicholes Mango, MD  atorvastatin (LIPITOR) 10 MG tablet Take 1  tablet (10 mg total) by mouth daily. Patient not taking: Reported on 07/16/2019 08/01/15   Dustin Flock, MD  insulin aspart (NOVOLOG) 100 UNIT/ML injection Inject into the skin. Three times daily with meals. Sliding scale CBG <150 -0 unit, 151-250 5 units; 251-350 8 units    [provider]  insulin glargine (LANTUS) 100 UNIT/ML injection Inject into the skin. 10 units sq daily at bedtime    [provider]  loperamide (IMODIUM) 2 MG capsule Take 1 capsule (2 mg total) by mouth every 6 (six) hours as needed for diarrhea or loose stools. Patient not taking: Reported on 07/16/2019 09/24/16   Dustin Flock, MD  magnesium oxide (MAG-OX) 400 MG tablet Take 400 mg by mouth daily.     [provider]  metFORMIN (GLUCOPHAGE) 500 MG tablet Take 500 mg by mouth 2 (two) times daily with a meal.    [provider]  oxyCODONE-acetaminophen (PERCOCET) 5-325 MG tablet Take 1 tablet by mouth every 8 (eight) hours as needed for severe pain. Patient not taking: Reported on 07/16/2019 08/01/15   Dustin Flock, MD    Allergies Aspergum [aspirin], Heparin, and Lisinopril  Family History  Problem Relation Age of Onset  . Heart disease Mother 40       Deceased  . CVA Mother   . Seizures Father 63       Deceased from complications with seizures    Social History Social History   Tobacco Use  . Smoking status: Former Smoker    Packs/day: 1.00    Years: 30.00    Pack years: 30.00    Types: Cigarettes  . Smokeless tobacco: Never Used  . Tobacco comment: 30 pack-year history. Quit 8-9 years ago.   Substance Use Topics  . Alcohol use: No  . Drug use: No    Review of Systems  Constitutional: No fever/chills Eyes: No visual changes. ENT: No sore throat. Cardiovascular: Denies chest pain. Respiratory: Positive for cough and shortness of breath. Gastrointestinal: No abdominal pain.  No nausea, no vomiting.  No diarrhea.  No constipation. Genitourinary: Negative for  dysuria. Musculoskeletal: Negative for back pain. Skin: Negative for rash. Neurological: Negative for headaches, focal weakness or numbness.   ____________________________________________   PHYSICAL EXAM:  VITAL SIGNS: ED Triage Vitals  Enc Vitals Group     BP 07/16/19 0038 (!) 172/81     Pulse Rate 07/16/19 0038 (!) 48     Resp 07/16/19 0038 20     Temp --      Temp src --      SpO2 07/16/19 0038 95 %     Weight 07/16/19 0038 200 lb (90.7 kg)     Height 07/16/19 0038 6' (1.829 m)     Head Circumference --      Peak  Flow --      Pain Score 07/16/19 0036 4     Pain Loc --      Pain Edu? --      Excl. in [place]? --     Constitutional: Alert and oriented. Well appearing and in no acute distress. Eyes: Conjunctivae are normal. PERRL. EOMI. Head: Atraumatic. Nose: No congestion/rhinnorhea. Mouth/Throat: Mucous membranes are moist.  Oropharynx non-erythematous. Neck: No stridor.   Cardiovascular: Normal rate, regular rhythm. Grossly normal heart sounds.  Good peripheral circulation. Respiratory: Normal respiratory effort.  No retractions. Lungs CTAB. Gastrointestinal: Soft and nontender. No distention. No abdominal bruits. No CVA tenderness. Musculoskeletal: No lower extremity tenderness nor edema.  No joint effusions. Neurologic:  Normal speech and language. No gross focal neurologic deficits are appreciated. No gait instability. Skin:  Skin is warm, dry and intact. No rash noted. Psychiatric: Mood and affect are normal. Speech and behavior are normal.  ____________________________________________   LABS (all labs ordered are listed, but only abnormal results are displayed)  Labs Reviewed  CBC WITH DIFFERENTIAL/PLATELET - Abnormal; Notable for the following components:      Result Value   Hemoglobin 17.4 (*)    Eosinophils Absolute 0.9 (*)    Basophils Absolute 0.2 (*)    All other components within normal limits  COMPREHENSIVE METABOLIC PANEL - Abnormal; Notable for  the following components:   Sodium 131 (*)    Chloride 96 (*)    Glucose, Bld 222 (*)    BUN 26 (*)    Creatinine, Ser 1.28 (*)    Total Protein 8.3 (*)    Total Bilirubin 1.3 (*)    GFR calc non Af Amer 55 (*)    All other components within normal limits  BRAIN NATRIURETIC PEPTIDE - Abnormal; Notable for the following components:   B Natriuretic Peptide 127.0 (*)    All other components within normal limits  FIBRINOGEN - Abnormal; Notable for the following components:   Fibrinogen 603 (*)    All other components within normal limits  RESPIRATORY PANEL BY RT PCR (FLU A&B, COVID)  CULTURE, BLOOD (ROUTINE X 2)  CULTURE, BLOOD (ROUTINE X 2)  LACTIC ACID, PLASMA  FIBRIN DERIVATIVES D-DIMER ([facility] ONLY)  FERRITIN  LACTATE DEHYDROGENASE  PROCALCITONIN  C-REACTIVE PROTEIN  POC SARS CORONAVIRUS 2 AG -  ED  POC SARS CORONAVIRUS 2 AG  ABO/RH  ABO/RH  TROPONIN I (HIGH SENSITIVITY)  TROPONIN I (HIGH SENSITIVITY)   ____________________________________________  EKG  ED ECG REPORT I, Muhamad Serano J, the attending physician, personally viewed and interpreted this ECG.   Date: 07/16/2019  EKG Time: 0041  Rate: 58  Rhythm: normal EKG, normal sinus rhythm  Axis: Normal  Intervals:none  ST&T Change: Nonspecific  ED ECG REPORT I, Samil Mecham J, the attending physician, personally viewed and interpreted this ECG.   Date: 07/16/2019  EKG Time: 0356  Rate: 48  Rhythm: sinus bradycardia  Axis: Normal  Intervals:none  ST&T Change: Nonspecific   ____________________________________________  RADIOLOGY  ED MD interpretation: No pneumonia; no PE  Official radiology report(s): CT Angio Chest PE W/Cm &/Or Wo Cm  Result Date: 07/16/2019 CLINICAL DATA:  Patient with tachycardia and decreased oxygen saturation. EXAM: CT ANGIOGRAPHY CHEST WITH CONTRAST TECHNIQUE: Multidetector CT imaging of the chest was performed using the standard protocol during bolus administration of intravenous  contrast. Multiplanar CT image reconstructions and MIPs were obtained to evaluate the vascular anatomy. CONTRAST:  8mL OMNIPAQUE IOHEXOL 350 MG/ML SOLN COMPARISON:  Chest radiograph earlier same day;  CT chest March 13, 2015 FINDINGS: Cardiovascular: Normal heart size. No pericardial effusion. Thoracic aortic vascular calcifications. Patient status post CABG procedure. Adequate opacification of the pulmonary arterial system. No evidence for acute pulmonary embolus. Mediastinum/Nodes: Prominent subcentimeter mediastinal lymph nodes. No axillary adenopathy. Normal appearance of the esophagus. Lungs/Pleura: Central airways are patent. No large area pulmonary consolidation. No pleural effusion or pneumothorax. There is a 6 mm nodule left upper lobe (image 27; series 6). Upper Abdomen: Unremarkable. Musculoskeletal: Thoracic spine degenerative changes. No aggressive or acute appearing osseous lesions. Review of the MIP images confirms the above findings. IMPRESSION: No evidence for acute pulmonary embolus. No acute process within the chest. Aortic Atherosclerosis (ICD10-I70.0). There is a 6 mm nodule left upper lobe. Non-contrast chest CT at 6-12 months is recommended. If the nodule is stable at time of repeat CT, then future CT at 18-24 months (from today's scan) is considered optional for low-risk patients, but is recommended for high-risk patients. This recommendation follows the consensus statement: Guidelines for Management of Incidental Pulmonary Nodules Detected on CT Images: From the Fleischner Society 2017; Radiology 2017; 284:228-243. Electronically Signed   By: Lovey Newcomer M.D.   On: 07/16/2019 05:31   DG Chest Port 1 View  Result Date: 07/16/2019 CLINICAL DATA:  Shortness of breath, symptoms for 2 days, worse today. EXAM: PORTABLE CHEST 1 VIEW COMPARISON:  Radiograph 09/26/2016 FINDINGS: Post median sternotomy and CABG. Heart is normal in size. Unchanged mediastinal contours. Left lung base  atelectasis or scarring. No acute airspace disease. No pulmonary edema, large pleural effusion or pneumothorax. Surgical hardware in the lower cervical spine is partially included. IMPRESSION: 1. Left lung base atelectasis or scarring. No acute pulmonary process. 2. Post CABG with normal heart size. Electronically Signed   By: Keith Rake M.D.   On: 07/16/2019 01:36    ____________________________________________   PROCEDURES  Procedure(s) performed (including Critical Care):  Procedures   ____________________________________________   INITIAL IMPRESSION / ASSESSMENT AND PLAN / ED COURSE  As part of my medical decision making, I reviewed the following data within the [facility] notes reviewed and incorporated, Labs reviewed, EKG interpreted, Old chart reviewed, Radiograph reviewed and Notes from prior ED visits     ANTAVIOUS SKELLIE was evaluated in Emergency Department on 07/16/2019 for the symptoms described in the history of present illness. He was evaluated in the context of the global COVID-19 pandemic, which necessitated consideration that the patient might be at risk for infection with the SARS-CoV-2 virus that causes COVID-19. Institutional protocols and algorithms that pertain to the evaluation of patients at risk for COVID-19 are in a state of rapid change based on information released by regulatory bodies including the CDC and federal and state organizations. These policies and algorithms were followed during the patient's care in the ED.    74 year old male who presents to the ED with cough and shortness of breath. Differential includes, but is not limited to, viral syndrome, bronchitis including COPD exacerbation, pneumonia, reactive airway disease including asthma, CHF including exacerbation with or without pulmonary/interstitial edema, pneumothorax, ACS, thoracic trauma, and pulmonary embolism.  We will obtain work-up including blood cultures and  lactic acid; check Covid inflammatory markers, rapid Covid antigen, chest x-ray.  Will ambulate with pulse oximeter.   Clinical Course as of Jul 15 620  Sat Jul 16, 2019  0246 Patient ambulated on room air without hypoxia.  Patient was dizzy on ambulation and tachypneic.   [JS]  [id number] Patient having right-sided  chest pain.  Will obtain EKG.  Patient has anaphylaxis to aspirin so we will hold.  Applied nitroglycerin paste.   [JS]    Clinical Course User Index [JS] Paulette Blanch, MD     ____________________________________________   FINAL CLINICAL IMPRESSION(S) / ED DIAGNOSES  Final diagnoses:  Shortness of breath  Chest pain, unspecified type     ED Discharge Orders    None       Note:  This document was prepared using Dragon voice recognition software and may include unintentional dictation errors.   Paulette Blanch, MD 07/16/19 [phone]

## 2019-07-16 NOTE — Discharge Instructions (Signed)
Nonspecific Chest Pain Chest pain can be caused by many different conditions. Some causes of chest pain can be life-threatening. These will require treatment right away. Serious causes of chest pain include:  Heart attack.  A tear in the body's main blood vessel.  Redness and swelling (inflammation) around your heart.  Blood clot in your lungs. Other causes of chest pain may not be so serious. These include:  Heartburn.  Anxiety or stress.  Damage to bones or muscles in your chest.  Lung infections. Chest pain can feel like:  Pain or discomfort in your chest.  Crushing, pressure, aching, or squeezing pain.  Burning or tingling.  Dull or sharp pain that is worse when you move, cough, or take a deep breath.  Pain or discomfort that is also felt in your back, neck, jaw, shoulder, or arm, or pain that spreads to any of these areas. It is hard to know whether your pain is caused by something that is serious or something that is not so serious. So it is important to see your doctor right away if you have chest pain. Follow these instructions at home: Medicines  Take over-the-counter and prescription medicines only as told by your doctor.  If you were prescribed an antibiotic medicine, take it as told by your doctor. Do not stop taking the antibiotic even if you start to feel better. Lifestyle   Rest as told by your doctor.  Do not use any products that contain nicotine or tobacco, such as cigarettes, e-cigarettes, and chewing tobacco. If you need help quitting, ask your doctor.  Do not drink alcohol.  Make lifestyle changes as told by your doctor. These may include: ? Getting regular exercise. Ask your doctor what activities are safe for you. ? Eating a heart-healthy diet. A diet and nutrition specialist (dietitian) can help you to learn healthy eating options. ? Staying at a healthy weight. ? Treating diabetes or high blood pressure, if needed. ? Lowering your stress.  Activities such as yoga and relaxation techniques can help. General instructions  Pay attention to any changes in your symptoms. Tell your doctor about them or any new symptoms.  Avoid any activities that cause chest pain.  Keep all follow-up visits as told by your doctor. This is important. You may need more testing if your chest pain does not go away. Contact a doctor if:  Your chest pain does not go away.  You feel depressed.  You have a fever. Get help right away if:  Your chest pain is worse.  You have a cough that gets worse, or you cough up blood.  You have very bad (severe) pain in your belly (abdomen).  You pass out (faint).  You have either of these for no clear reason: ? Sudden chest discomfort. ? Sudden discomfort in your arms, back, neck, or jaw.  You have shortness of breath at any time.  You suddenly start to sweat, or your skin gets clammy.  You feel sick to your stomach (nauseous).  You throw up (vomit).  You suddenly feel lightheaded or dizzy.  You feel very weak or tired.  Your heart starts to beat fast, or it feels like it is skipping beats. These symptoms may be an emergency. Do not wait to see if the symptoms will go away. Get medical help right away. Call your local emergency services (911 in the [place] Do not drive yourself to the hospital. Summary  Chest pain can be caused by many different conditions. The   cause may be serious and need treatment right away. If you have chest pain, see your doctor right away.  Follow your doctor's instructions for taking medicines and making lifestyle changes.  Keep all follow-up visits as told by your doctor. This includes visits for any further testing if your chest pain does not go away.  Be sure to know the signs that show that your condition has become worse. Get help right away if you have these symptoms. This information is not intended to replace advice given to you by your health care provider. Make  sure you discuss any questions you have with your health care provider. Document Revised: 12/03/2017 Document Reviewed: 12/03/2017 Elsevier Patient Education  [place].  Acute Bronchitis, Adult  Acute bronchitis is when air tubes in the lungs (bronchi) suddenly get swollen. The condition can make it hard for you to breathe. In adults, acute bronchitis usually goes away within 2 weeks. A cough caused by bronchitis may last up to 3 weeks. Smoking, allergies, and asthma can make the condition worse. What are the causes? This condition is caused by:  Cold and flu viruses. The most common cause of this condition is the virus that causes the common cold.  Bacteria.  Substances that irritate the lungs, including: ? Smoke from cigarettes and other types of tobacco. ? Dust and pollen. ? Fumes from chemicals, gases, or burned fuel. ? Other materials that pollute indoor or outdoor air.  Close contact with someone who has acute bronchitis. What increases the risk? The following factors may make you more likely to develop this condition:  A weak body's defense system. This is also called the immune system.  Any condition that affects your lungs and breathing, such as asthma. What are the signs or symptoms? Symptoms of this condition include:  A cough.  Coughing up clear, yellow, or green mucus.  Wheezing.  Chest congestion.  Shortness of breath.  A fever.  Body aches.  Chills.  A sore throat. How is this treated? Acute bronchitis may go away over time without treatment. Your doctor may recommend:  Drinking more fluids.  Taking a medicine for a fever or cough.  Using a device that gets medicine into your lungs (inhaler).  Using a vaporizer or a humidifier. These are machines that add water or moisture in the air to help with coughing and poor breathing. Follow these instructions at home:  Activity  Get a lot of rest.  Avoid places where there are fumes from  chemicals.  Return to your normal activities as told by your doctor. Ask your doctor what activities are safe for you. Lifestyle  Drink enough fluids to keep your pee (urine) pale yellow.  Do not drink alcohol.  Do not use any products that contain nicotine or tobacco, such as cigarettes, e-cigarettes, and chewing tobacco. If you need help quitting, ask your doctor. Be aware that: ? Your bronchitis will get worse if you smoke or breathe in other people's smoke (secondhand smoke). ? Your lungs will heal faster if you quit smoking. General instructions  Take over-the-counter and prescription medicines only as told by your doctor.  Use an inhaler, cool mist vaporizer, or humidifier as told by your doctor.  Rinse your mouth often with salt water. To make salt water, dissolve -1 tsp (3-6 g) of salt in 1 cup (237 mL) of warm water.  Keep all follow-up visits as told by your doctor. This is important. How is this prevented? To lower your risk  of getting this condition again:  Wash your hands often with soap and water. If soap and water are not available, use hand sanitizer.  Avoid contact with people who have cold symptoms.  Try not to touch your mouth, nose, or eyes with your hands.  Make sure to get the flu shot every year. Contact a doctor if:  Your symptoms do not get better in 2 weeks.  You vomit more than once or twice.  You have symptoms of loss of fluid from your body (dehydration). These include: ? Dark urine. ? Dry skin or eyes. ? Increased thirst. ? Headaches. ? Confusion. ? Muscle cramps. Get help right away if:  You cough up blood.  You have chest pain.  You have very bad shortness of breath.  You become dehydrated.  You faint or keep feeling like you are going to faint.  You keep vomiting.  You have a very bad headache.  Your fever or chills get worse. These symptoms may be an emergency. Do not wait to see if the symptoms will go away. Get medical  help right away. Call your local emergency services (911 in the [place] Do not drive yourself to the hospital. Summary  Acute bronchitis is when air tubes in the lungs (bronchi) suddenly get swollen. In adults, acute bronchitis usually goes away within 2 weeks.  Take over-the-counter and prescription medicines only as told by your doctor.  Drink enough fluid to keep your pee (urine) pale yellow.  Contact a doctor if your symptoms do not improve after 2 weeks of treatment.  Get help right away if you cough up blood, faint, or have chest pain or shortness of breath. This information is not intended to replace advice given to you by your health care provider. Make sure you discuss any questions you have with your health care provider. Document Revised: 12/24/2018 Document Reviewed: 12/24/2018 Elsevier Patient Education  [place].

## 2019-07-17 DIAGNOSIS — R0602 Shortness of breath: Secondary | ICD-10-CM

## 2019-07-17 NOTE — Discharge Summary (Signed)
Physician Discharge Summary  Patient ID: Bryan Scott MRN: [id number] DOB/AGE: February 22, 1946 74 y.o.  Admit date: 07/16/2019 Discharge date: 07/17/2019  Admission Diagnoses:  Discharge Diagnoses:  Active Problems:   Chest pain   Discharged Condition: fair  Hospital Course:  Bryan Scott  is a 74 y.o. Caucasian male with a known history of multiple medical problems that are mentioned below, including hypertension, dyslipidemia and coronary artery disease status post CABG as well as paroxysmal atrial fibrillation, who presented to the emergency room with acute onset of worsening dyspnea since Wednesday with associated right-sided chest pain felt as tightness with lightheadedness and dizziness with no palpitations.  He denies any nausea or vomiting or diaphoresis.  No syncope.  No paresthesias or focal muscle weakness.  He denied any cough or wheezing or hemoptysis or recent sick exposure or exposure to COVID-19.  1.  Dyspnea with right-sided chest pain and bradycardia.   -Resolved -Noncardiac origin -Consulted cardiology: Does not think any intervention necessary or any other functional study needed at this time. -Troponin remain negative cholesterol panel showing elevation of cholesterol 243, LDL 165 triglyceride 237 -2D echo showing EF of 60 to 65% mildly increased left ventricular hypertrophy -Likely bronchitis; Rx for Flonase and guaifenesin sent to pharmacy -Developed fever.  Respiratory examination remained normal with no wheezing or crackles. -Follow-up with cardiology in 1 week.  Appointment and contact emotion provided to the patient.  2.  Type 2 diabetes mellitus.    Stable.  Resume home medication    3.  Dyslipidemia.   Continue home dose statin  4.  Hypertension.  We will continue hydralazine and cautiously resume amlodipine.  5.  Paroxysmal atrial fibrillation.    Continue home antithrombotic  6.  GERD.   Continue GERD PPI  Consults: cardiology  Significant Diagnostic  Studies: Radiologic imaging, echocardiogram and blood work  Treatments: Per hospital course and discharge med list  Discharge Exam: Blood pressure 124/77, pulse (!) 52, temperature (!) 97.5 F (36.4 C), temperature source Oral, resp. rate 18, height 6' (1.829 m), weight 89.7 kg, SpO2 96 %.  General: Well developed, well nourished, in no acute distress HEENT:  Normocephalic and atramatic Neck:   No JVD.  Lungs: Clear bilaterally to auscultation and percussion. Heart: HRRR . Normal S1 and S2 without gallops or murmurs.  Abdomen: Bowel sounds are positive, abdomen soft and non-tender  Msk:  Back normal, normal gait. Normal strength and tone for age. Extremities: No clubbing, cyanosis or edema.   Neuro: Alert and oriented X 3. Psych:  Good affect, responds appropriately  Disposition: Discharge disposition: 01-Home or Self Care     Stable to be discharged home with close follow-up with PCP and cardiology follow-up in 1 week.  Warning signs and symptoms explained to patient when he must seek immediate medical attention.  Patient expressed understanding.  Discharge Instructions    Call MD for:  difficulty breathing, headache or visual disturbances   Complete by: As directed    Call MD for:  severe uncontrolled pain   Complete by: As directed    Diet - low sodium heart healthy   Complete by: As directed    Increase activity slowly   Complete by: As directed      Allergies as of 07/16/2019      Reactions   Aspergum [aspirin] Anaphylaxis   Heparin Other (See Comments)   Reaction:  Bleeding    Lisinopril Swelling      Medication List    STOP taking these  medications   hydrALAZINE 25 MG tablet Commonly known as: APRESOLINE   loperamide 2 MG capsule Commonly known as: IMODIUM     TAKE these medications   acetaminophen 325 MG tablet Commonly known as: TYLENOL Take 975 mg by mouth 3 (three) times daily as needed for mild pain or fever.   alum & mag hydroxide-simeth  200-200-20 MG/5ML suspension Commonly known as: MAALOX/MYLANTA Take 30 mLs by mouth every 6 (six) hours as needed for indigestion or heartburn.   amLODipine 5 MG tablet Commonly known as: NORVASC Take 1 tablet (5 mg total) by mouth daily.   atorvastatin 10 MG tablet Commonly known as: Lipitor Take 1 tablet (10 mg total) by mouth daily.   carvedilol 12.5 MG tablet Commonly known as: COREG Take 12.5 mg by mouth every 12 (twelve) hours.   fluticasone 50 MCG/ACT nasal spray Commonly known as: FLONASE Place 1 spray into both nostrils 2 (two) times daily. Stop after 10 days   glipiZIDE 10 MG tablet Commonly known as: GLUCOTROL Take 10 mg by mouth 2 (two) times daily before a meal.   guaiFENesin-dextromethorphan 100-10 MG/5ML syrup Commonly known as: ROBITUSSIN DM Take 5 mLs by mouth every 4 (four) hours as needed for cough.   insulin glargine 100 UNIT/ML injection Commonly known as: LANTUS Inject into the skin. 10 units sq daily at bedtime   magnesium oxide 400 MG tablet Commonly known as: MAG-OX Take 400 mg by mouth daily.   metFORMIN 500 MG tablet Commonly known as: GLUCOPHAGE Take 500 mg by mouth 2 (two) times daily with a meal.   morphine 15 MG tablet Commonly known as: MSIR Take 15 mg by mouth 3 (three) times daily.   NovoLOG 100 UNIT/ML injection Generic drug: insulin aspart Inject into the skin. Three times daily with meals. Sliding scale CBG <150 -0 unit, 151-250 5 units; 251-350 8 units   ondansetron 8 MG tablet Commonly known as: ZOFRAN Take 8 mg by mouth every 8 (eight) hours as needed for nausea or vomiting.   oxyCODONE-acetaminophen 5-325 MG tablet Commonly known as: Percocet Take 1 tablet by mouth every 8 (eight) hours as needed for severe pain.   pantoprazole 40 MG tablet Commonly known as: PROTONIX Take 40 mg by mouth 2 (two) times daily.   rivaroxaban 20 MG Tabs tablet Commonly known as: XARELTO Take 1 tablet (20 mg total) by mouth daily with  supper.   saccharomyces boulardii 250 MG capsule Commonly known as: Florastor Take 1 capsule (250 mg total) by mouth 2 (two) times daily.      Follow-up Information    Paraschos, Alexander, MD. Schedule an appointment as soon as possible for a visit in 1 week(s).   Specialty: Cardiology Why: F/U in 1 Week  Contact information: [facility] [place] [address] [phone]        [name], MD. Schedule an appointment as soon as possible for a visit in 2 week(s).   Specialty: Family Medicine Contact information: [place] [address] [phone]           Signed: Thornell Mule 07/17/2019, 2:36 PM

## 2019-07-21 LAB — CULTURE, BLOOD (ROUTINE X 2)
Culture: NO GROWTH
Culture: NO GROWTH
Special Requests: ADEQUATE

## 2019-09-08 ENCOUNTER — Observation Stay
Admission: EM | Admit: 2019-09-08 | Discharge: 2019-09-10 | Disposition: A | Discharge status: Home / Self Care | Payer: Medicare PPO | Attending: Internal Medicine | Admitting: Internal Medicine

## 2019-09-08 ENCOUNTER — Emergency Department: Payer: Medicare PPO

## 2019-09-08 ENCOUNTER — Other Ambulatory Visit: Payer: Self-pay

## 2019-09-08 DIAGNOSIS — I6523 Occlusion and stenosis of bilateral carotid arteries: Secondary | ICD-10-CM | POA: Insufficient documentation

## 2019-09-08 DIAGNOSIS — Z951 Presence of aortocoronary bypass graft: Secondary | ICD-10-CM | POA: Diagnosis not present

## 2019-09-08 DIAGNOSIS — Z87891 Personal history of nicotine dependence: Secondary | ICD-10-CM | POA: Diagnosis not present

## 2019-09-08 DIAGNOSIS — G9341 Metabolic encephalopathy: Secondary | ICD-10-CM | POA: Diagnosis present

## 2019-09-08 DIAGNOSIS — E871 Hypo-osmolality and hyponatremia: Secondary | ICD-10-CM | POA: Diagnosis not present

## 2019-09-08 DIAGNOSIS — F12920 Cannabis use, unspecified with intoxication, uncomplicated: Secondary | ICD-10-CM

## 2019-09-08 DIAGNOSIS — E785 Hyperlipidemia, unspecified: Secondary | ICD-10-CM | POA: Diagnosis present

## 2019-09-08 DIAGNOSIS — I10 Essential (primary) hypertension: Secondary | ICD-10-CM | POA: Diagnosis present

## 2019-09-08 DIAGNOSIS — E1159 Type 2 diabetes mellitus with other circulatory complications: Secondary | ICD-10-CM | POA: Diagnosis present

## 2019-09-08 DIAGNOSIS — R2681 Unsteadiness on feet: Secondary | ICD-10-CM | POA: Diagnosis not present

## 2019-09-08 DIAGNOSIS — Z888 Allergy status to other drugs, medicaments and biological substances status: Secondary | ICD-10-CM | POA: Diagnosis not present

## 2019-09-08 DIAGNOSIS — Z8249 Family history of ischemic heart disease and other diseases of the circulatory system: Secondary | ICD-10-CM | POA: Insufficient documentation

## 2019-09-08 DIAGNOSIS — Z886 Allergy status to analgesic agent status: Secondary | ICD-10-CM | POA: Diagnosis not present

## 2019-09-08 DIAGNOSIS — Z79899 Other long term (current) drug therapy: Secondary | ICD-10-CM | POA: Diagnosis not present

## 2019-09-08 DIAGNOSIS — K219 Gastro-esophageal reflux disease without esophagitis: Secondary | ICD-10-CM | POA: Diagnosis not present

## 2019-09-08 DIAGNOSIS — Z20822 Contact with and (suspected) exposure to covid-19: Secondary | ICD-10-CM | POA: Insufficient documentation

## 2019-09-08 DIAGNOSIS — G894 Chronic pain syndrome: Secondary | ICD-10-CM | POA: Diagnosis present

## 2019-09-08 DIAGNOSIS — I480 Paroxysmal atrial fibrillation: Secondary | ICD-10-CM | POA: Diagnosis not present

## 2019-09-08 DIAGNOSIS — Z7901 Long term (current) use of anticoagulants: Secondary | ICD-10-CM | POA: Insufficient documentation

## 2019-09-08 DIAGNOSIS — E1151 Type 2 diabetes mellitus with diabetic peripheral angiopathy without gangrene: Secondary | ICD-10-CM

## 2019-09-08 DIAGNOSIS — E1142 Type 2 diabetes mellitus with diabetic polyneuropathy: Secondary | ICD-10-CM | POA: Diagnosis not present

## 2019-09-08 DIAGNOSIS — I255 Ischemic cardiomyopathy: Secondary | ICD-10-CM | POA: Insufficient documentation

## 2019-09-08 DIAGNOSIS — J329 Chronic sinusitis, unspecified: Secondary | ICD-10-CM | POA: Insufficient documentation

## 2019-09-08 DIAGNOSIS — D72829 Elevated white blood cell count, unspecified: Principal | ICD-10-CM | POA: Diagnosis present

## 2019-09-08 DIAGNOSIS — F12929 Cannabis use, unspecified with intoxication, unspecified: Secondary | ICD-10-CM | POA: Insufficient documentation

## 2019-09-08 DIAGNOSIS — E119 Type 2 diabetes mellitus without complications: Secondary | ICD-10-CM

## 2019-09-08 DIAGNOSIS — E1165 Type 2 diabetes mellitus with hyperglycemia: Secondary | ICD-10-CM

## 2019-09-08 DIAGNOSIS — I2510 Atherosclerotic heart disease of native coronary artery without angina pectoris: Secondary | ICD-10-CM | POA: Diagnosis present

## 2019-09-08 DIAGNOSIS — Z794 Long term (current) use of insulin: Secondary | ICD-10-CM | POA: Diagnosis not present

## 2019-09-08 DIAGNOSIS — R9431 Abnormal electrocardiogram [ECG] [EKG]: Secondary | ICD-10-CM | POA: Diagnosis present

## 2019-09-08 DIAGNOSIS — R4182 Altered mental status, unspecified: Secondary | ICD-10-CM

## 2019-09-08 LAB — COMPREHENSIVE METABOLIC PANEL
ALT: 23 U/L (ref 0–44)
AST: 18 U/L (ref 15–41)
Albumin: 4.1 g/dL (ref 3.5–5.0)
Alkaline Phosphatase: 98 U/L (ref 38–126)
Anion gap: 11 (ref 5–15)
BUN: 27 mg/dL — ABNORMAL HIGH (ref 8–23)
CO2: 22 mmol/L (ref 22–32)
Calcium: 9.3 mg/dL (ref 8.9–10.3)
Chloride: 97 mmol/L — ABNORMAL LOW (ref 98–111)
Creatinine, Ser: 1.05 mg/dL (ref 0.61–1.24)
GFR calc Af Amer: >60 mL/min (ref >60)
GFR calc non Af Amer: >60 mL/min (ref >60)
Glucose, Bld: 299 mg/dL — ABNORMAL HIGH (ref 70–99)
Potassium: 4.1 mmol/L (ref 3.5–5.1)
Sodium: 130 mmol/L — ABNORMAL LOW (ref 135–145)
Total Bilirubin: 1.3 mg/dL — ABNORMAL HIGH (ref 0.3–1.2)
Total Protein: 8.1 g/dL (ref 6.5–8.1)

## 2019-09-08 LAB — URINE DRUG SCREEN, QUALITATIVE (ARMC ONLY)
Amphetamines, Ur Screen: NOT DETECTED
Barbiturates, Ur Screen: NOT DETECTED
Benzodiazepine, Ur Scrn: NOT DETECTED
Cannabinoid 50 Ng, Ur ~~LOC~~: POSITIVE — AB
Cocaine Metabolite,Ur ~~LOC~~: NOT DETECTED
MDMA (Ecstasy)Ur Screen: NOT DETECTED
Methadone Scn, Ur: NOT DETECTED
Opiate, Ur Screen: POSITIVE — AB
Phencyclidine (PCP) Ur S: NOT DETECTED
Tricyclic, Ur Screen: NOT DETECTED

## 2019-09-08 LAB — GLUCOSE, CAPILLARY
Glucose-Capillary: 284 mg/dL — ABNORMAL HIGH (ref 70–99)
Glucose-Capillary: 218 mg/dL — ABNORMAL HIGH (ref 70–99)

## 2019-09-08 LAB — CBC
HCT: 47 % (ref 39.0–52.0)
Hemoglobin: 16.4 g/dL (ref 13.0–17.0)
MCH: 32.5 pg (ref 26.0–34.0)
MCHC: 34.9 g/dL (ref 30.0–36.0)
MCV: 93.1 fL (ref 80.0–100.0)
Platelets: 202 10*3/uL (ref 150–400)
RBC: 5.05 MIL/uL (ref 4.22–5.81)
RDW: 12.8 % (ref 11.5–15.5)
WBC: 15.1 10*3/uL — ABNORMAL HIGH (ref 4.0–10.5)
nRBC: 0 % (ref 0.0–0.2)

## 2019-09-08 IMAGING — CT CT HEAD W/O CM
3 series · 16 of 47 positions shown, 19 images · non-contrast
Comparison: Head CT dated [DATE].

CLINICAL DATA: 74-year-old male with dizziness.

EXAM:
CT HEAD WITHOUT CONTRAST
TECHNIQUE: Contiguous axial images were obtained from the base of the skull
through the vertex without intravenous contrast.

[Series 3: head wo · axial · 0.42mm/px · z∈[+511,+651]mm · 10 of 34 slices shown, 13 images]
[im 3/34  brain]
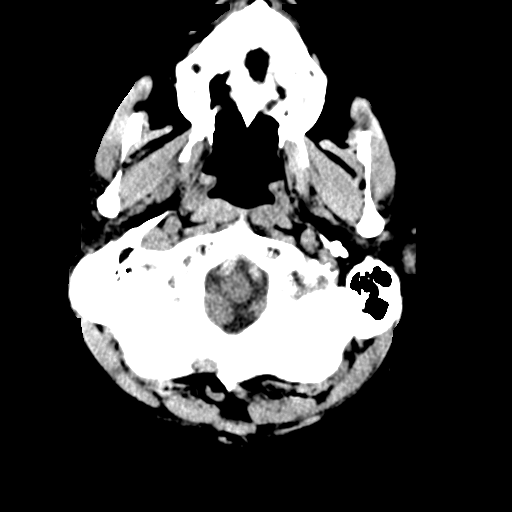
[im 3/34  bone]
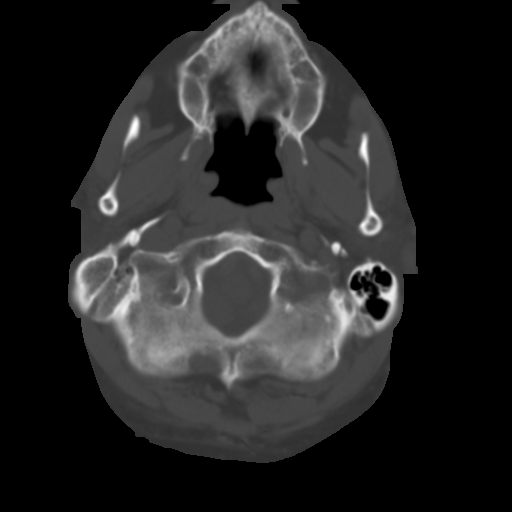
[im 6/34  brain]
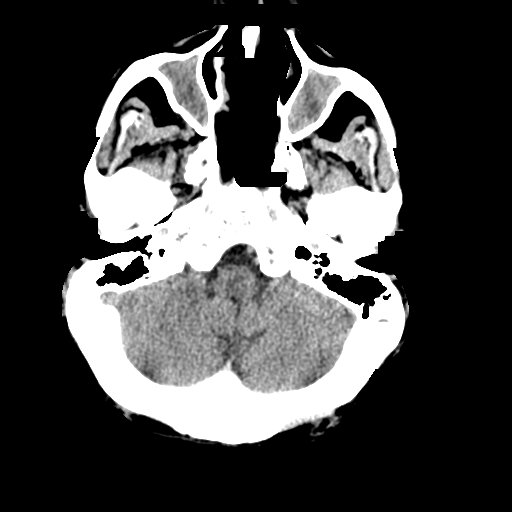
[im 10/34  brain]
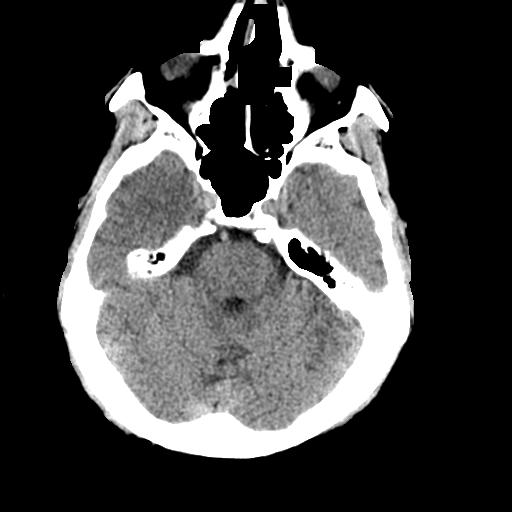
[im 12/34  brain]
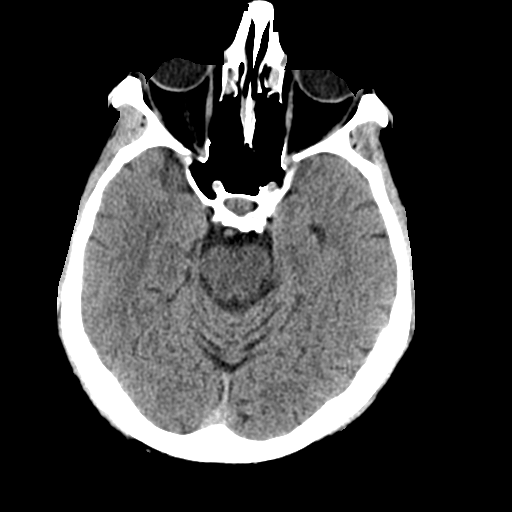
[im 15/34  brain]
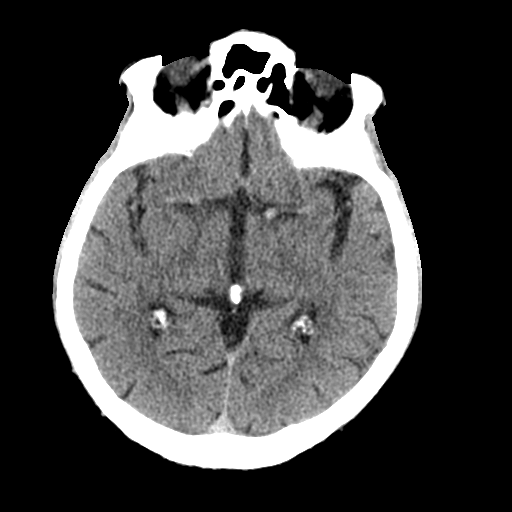
[im 15/34  bone]
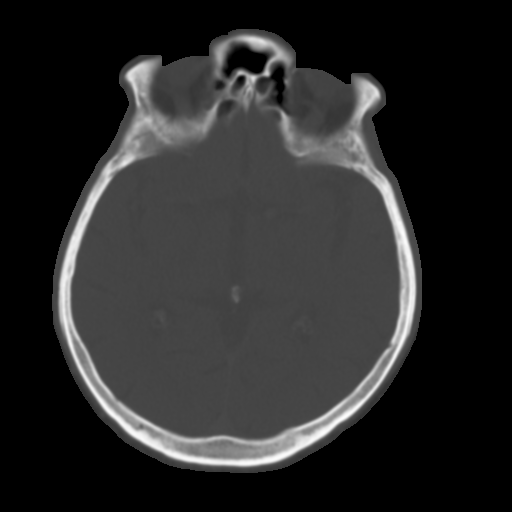
[im 19/34  brain]
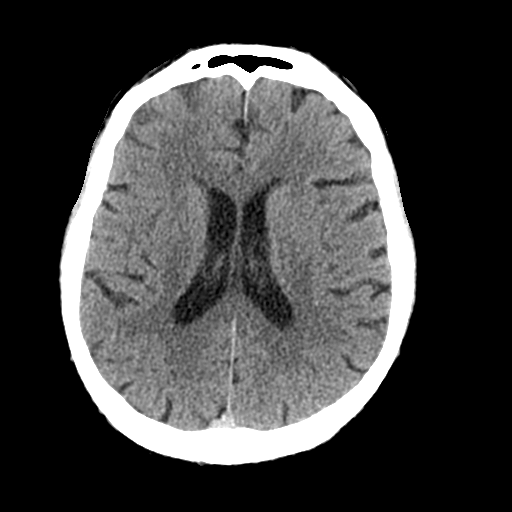
[im 22/34  brain]
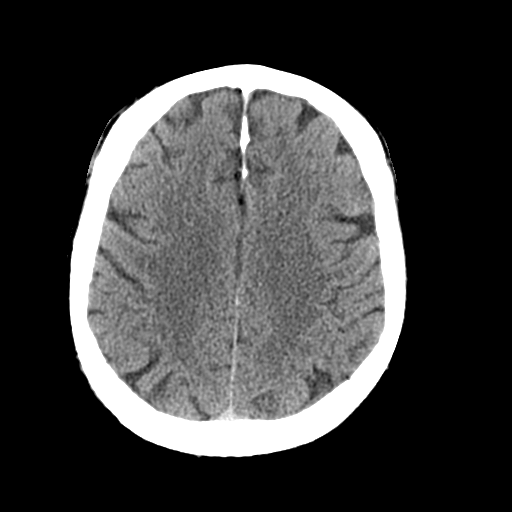
[im 26/34  brain]
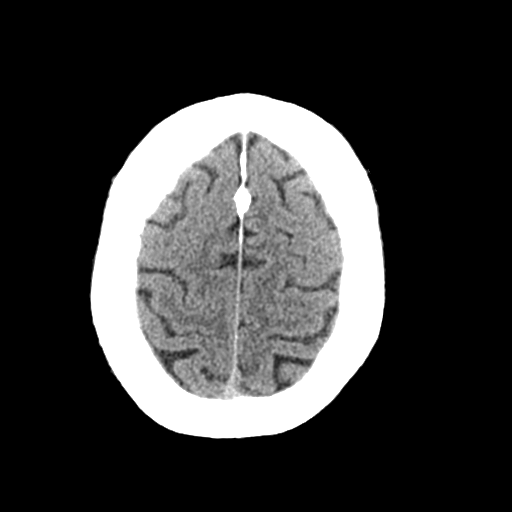
[im 28/34  brain]
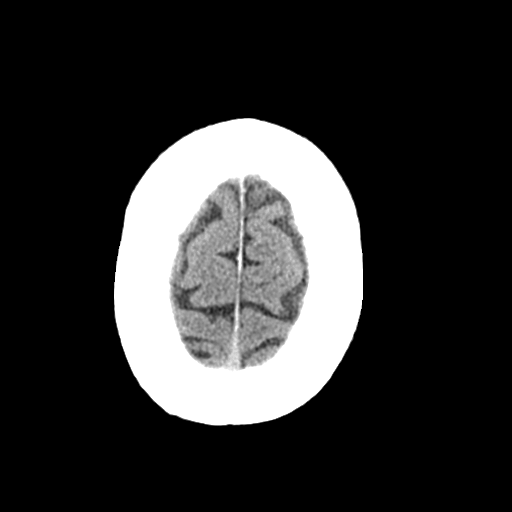
[im 28/34  bone]
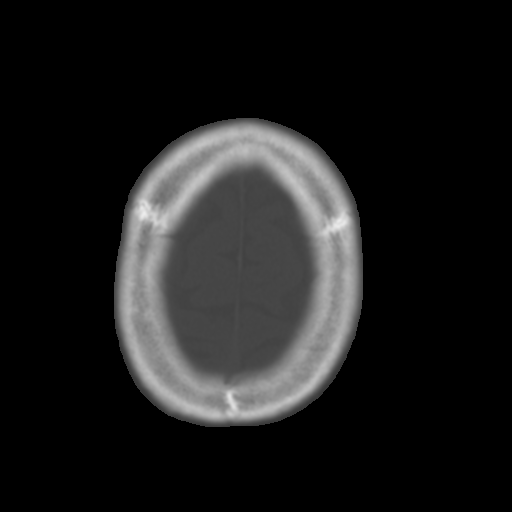
[im 31/34  brain]
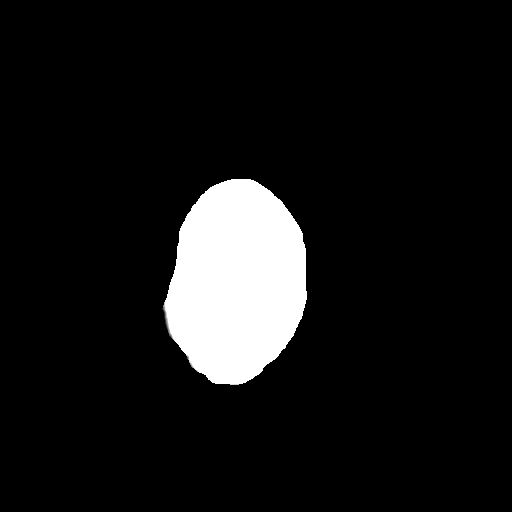

[Series 4: coronal soft tissue · coronal · 0.34mm/px · 3 of 70 slices shown]
[im 24/70  brain]
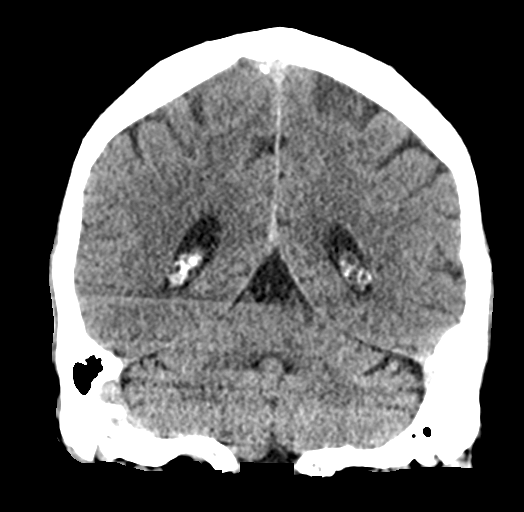
[im 31/70  brain]
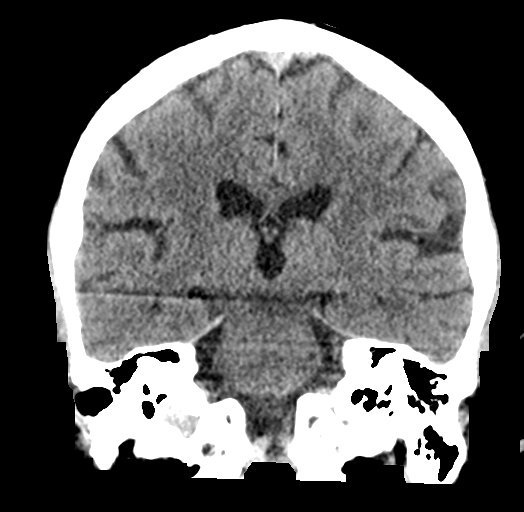
[im 39/70  brain]
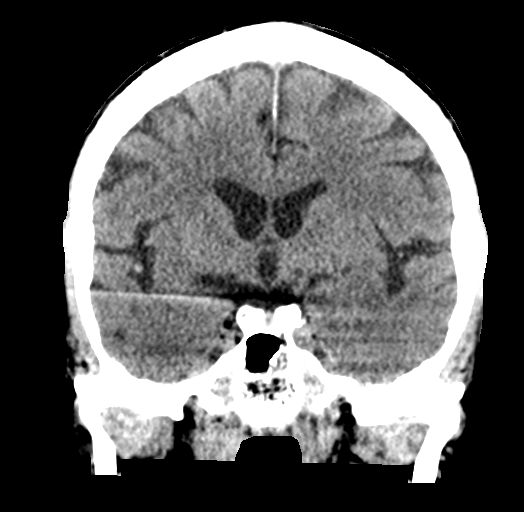

[Series 5: sagittal soft tissue · sagittal · 0.34mm/px · 3 of 54 slices shown]
[im 18/54  brain]
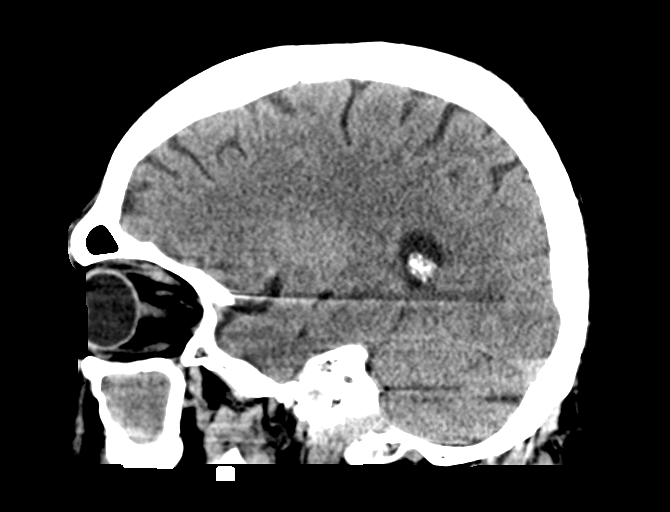
[im 27/54  brain]
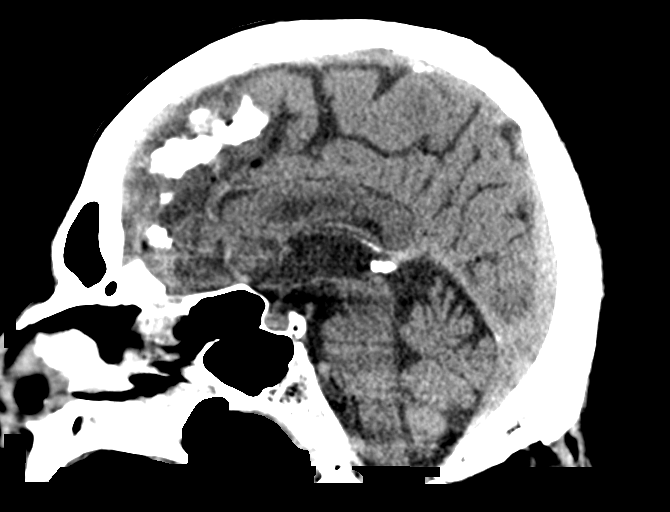
[im 36/54  brain]
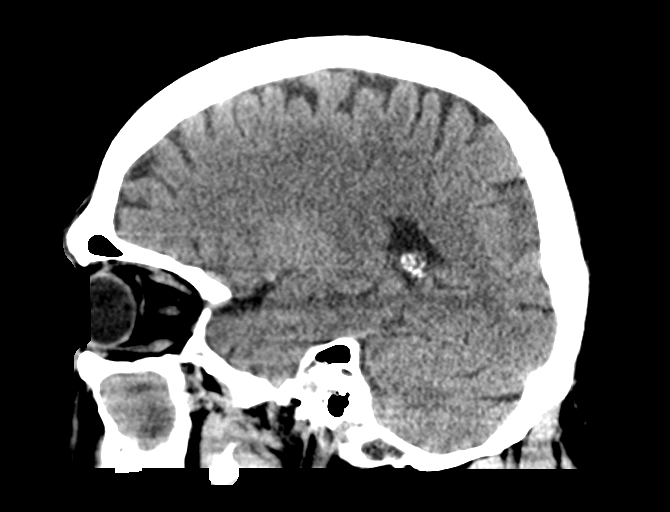

[16 of 47 positions shown; findings below may reference images not displayed]

FINDINGS: Brain: The ventricles and sulci appropriate size for patient's age.
Mild periventricular and deep white matter chronic microvascular
ischemic changes noted. There is no acute intracranial hemorrhage.
No mass effect or midline shift. No extra-axial fluid collection.

Vascular: No hyperdense vessel or unexpected calcification.

Skull: Normal. Negative for fracture or focal lesion.

Sinuses/Orbits: There is complete opacification of maxillary sinuses
and diffuse opacification of the ethmoid air cells. The mastoid air
cells are clear. No air-fluid level.

Other: None
IMPRESSION: 1. No acute intracranial pathology.
2. Paranasal sinus disease.

## 2019-09-08 MED ORDER — SODIUM CHLORIDE 0.9 % IV BOLUS
500.0000 mL | Freq: Once | INTRAVENOUS | Status: AC
  Administered 2019-09-08: 500 mL via INTRAVENOUS

## 2019-09-08 MED ORDER — SODIUM CHLORIDE 0.9% FLUSH: 3.0000 mL | Freq: Once | INTRAVENOUS | Status: DC

## 2019-09-08 MED ORDER — ONDANSETRON HCL 4 MG/2ML IJ SOLN
4.0000 mg | Freq: Once | INTRAMUSCULAR | Status: AC
  Administered 2019-09-08: 4 mg via INTRAVENOUS

## 2019-09-08 NOTE — ED Triage Notes (Signed)
Pt states he ate a brownie this morning that a waitress at the [employer] gave him and shortly after felt funny, fatigue, dizzy, just not feeling well.

## 2019-09-08 NOTE — ED Notes (Signed)
PT gave verbal permission to speak to sister about pt's condition PT transported to CT

## 2019-09-08 NOTE — ED Notes (Signed)
Pt able to nod when asked if he wanted any blankets. Pt a little more responsive. Pt's sister updated on plan of care.

## 2019-09-08 NOTE — ED Notes (Signed)
Pt is lying on stretcher, eating sandwich tray. Pt denies any further needs at this time.

## 2019-09-08 NOTE — ED Provider Notes (Signed)
[facility] Emergency Department Provider Note   ____________________________________________   I have reviewed the triage vital signs and the nursing notes.   HISTORY  Chief Complaint Altered mental status  History limited by and level 5 caveat due to: Altered Mental Status   HPI Bryan Scott is a 74 y.o. male who presents to the emergency department [facility] because of concern for altered mental status. Per report the patient was at [facility] when he ate a brownie given to him by a waitress. The patient himself is unable to give any history.   Records reviewed. Per medical record review patient has a history of CAD, HLD, HTN.   Past Medical History:  Diagnosis Date  . 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 Cancer ([place])   . Carotid artery disease ([facility])    > 75% bilateral ICA stenoses on 01/2013 CT  . Chronic pain syndrome   . Coronary artery disease    s/p CABG ~ 2007  . GERD (gastroesophageal reflux disease)   . Heparin allergy    Bleeding  . History of tobacco use   . Hyperlipidemia   . Hypertension   . Ischemic cardiomyopathy   . Paroxysmal atrial fibrillation ([facility])    On Xarelto anticoagulation  . Type 2 diabetes mellitus ([place])    A1C 6.8% in 01/2013    Patient Active Problem List   Diagnosis Date Noted  . Shortness of breath   . Hyponatremia 09/26/2016  . Chest pain 09/22/2016  . 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  . Abnormal EKG 09/21/2016  . Chronic pain 09/21/2016  . PAF (paroxysmal atrial fibrillation) ([place]) 09/21/2016  . Essential hypertension 05/02/2016  . Carotid stenosis 05/02/2016  . CAD in native artery   . Atypical chest pain   . Uncontrolled type 2 diabetes mellitus with complication, with long-term current use of insulin ([place])   . Hyperlipidemia   . Pain of left upper extremity   . Angina pectoris ([place])   . Abdominal pain   . Chest pain, central 03/13/2015  . Hypertensive  urgency 03/13/2015    Past Surgical History:  Procedure Laterality Date  . CARDIAC CATHETERIZATION    . CORONARY ARTERY BYPASS GRAFT  2007    Prior to Admission medications   Medication Sig Start Date End Date Taking? Authorizing Provider  acetaminophen (TYLENOL) 325 MG tablet Take 975 mg by mouth 3 (three) times daily as needed for mild pain or fever.    [provider]  alum & mag hydroxide-simeth (MAALOX/MYLANTA) 200-200-20 MG/5ML suspension Take 30 mLs by mouth every 6 (six) hours as needed for indigestion or heartburn. Patient not taking: Reported on 07/16/2019 03/16/15   Nicholes Mango, MD  amLODipine (NORVASC) 5 MG tablet Take 1 tablet (5 mg total) by mouth daily. 03/16/15   Gouru, Illene Silver, MD  atorvastatin (LIPITOR) 10 MG tablet Take 1 tablet (10 mg total) by mouth daily. Patient not taking: Reported on 07/16/2019 08/01/15   Dustin Flock, MD  carvedilol (COREG) 12.5 MG tablet Take 12.5 mg by mouth every 12 (twelve) hours.     [provider]  fluticasone (FLONASE) 50 MCG/ACT nasal spray Place 1 spray into both nostrils 2 (two) times daily. Stop after 10 days 07/16/19   Thornell Mule, MD  glipiZIDE (GLUCOTROL) 10 MG tablet Take 10 mg by mouth 2 (two) times daily before a meal.    [provider]  guaiFENesin-dextromethorphan (ROBITUSSIN DM) 100-10 MG/5ML syrup  Take 5 mLs by mouth every 4 (four) hours as needed for cough. 07/16/19   Thornell Mule, MD  insulin aspart (NOVOLOG) 100 UNIT/ML injection Inject into the skin. Three times daily with meals. Sliding scale CBG <150 -0 unit, 151-250 5 units; 251-350 8 units    [provider]  insulin glargine (LANTUS) 100 UNIT/ML injection Inject into the skin. 10 units sq daily at bedtime    [provider]  magnesium oxide (MAG-OX) 400 MG tablet Take 400 mg by mouth daily.     [provider]  metFORMIN (GLUCOPHAGE) 500 MG tablet Take 500 mg by mouth 2 (two) times daily with a meal.    [provider]  morphine (MSIR) 15 MG tablet Take 15 mg by mouth 3 (three) times daily.     [provider]  ondansetron (ZOFRAN) 8 MG tablet Take 8 mg by mouth every 8 (eight) hours as needed for nausea or vomiting.     [provider]  oxyCODONE-acetaminophen (PERCOCET) 5-325 MG tablet Take 1 tablet by mouth every 8 (eight) hours as needed for severe pain. Patient not taking: Reported on 07/16/2019 08/01/15   Dustin Flock, MD  pantoprazole (PROTONIX) 40 MG tablet Take 40 mg by mouth 2 (two) times daily.    [provider]  rivaroxaban (XARELTO) 20 MG TABS tablet Take 1 tablet (20 mg total) by mouth daily with supper. 09/22/16   Dustin Flock, MD  saccharomyces boulardii (FLORASTOR) 250 MG capsule Take 1 capsule (250 mg total) by mouth 2 (two) times daily. 03/16/15   Nicholes Mango, MD    Allergies Aspergum [aspirin], Heparin, and Lisinopril  Family History  Problem Relation Age of Onset  . Heart disease Mother 2       Deceased  . CVA Mother   . Seizures Father 20       Deceased from complications with seizures    Social History Social History   Tobacco Use  . Smoking status: Former Smoker    Packs/day: 1.00    Years: 30.00    Pack years: 30.00    Types: Cigarettes  . Smokeless tobacco: Never Used  . Tobacco comment: 30 pack-year history. Quit 8-9 years ago.   Substance Use Topics  . Alcohol use: No  . Drug use: No    Review of Systems Unable to obtain.  ____________________________________________   PHYSICAL EXAM:  VITAL SIGNS: ED Triage Vitals [09/08/19 1454]  Enc Vitals Group     BP (!) 112/55     Pulse Rate (!) 47     Resp 18     Temp (!) 97.5 F (36.4 C)     Temp src      SpO2 97 %     Weight 197 lb 12 oz (89.7 kg)     Height 6' (1.829 m)     Head Circumference      Peak Flow      Pain Score 0   Constitutional: Somnolent, awakens to verbal stimuli.  Eyes: Conjunctivae are normal.  ENT      Head: Normocephalic and  atraumatic.      Nose: No congestion/rhinnorhea.      Mouth/Throat: Mucous membranes are moist.      Neck: No stridor. Hematological/Lymphatic/Immunilogical: No cervical lymphadenopathy. Cardiovascular: Normal rate, regular rhythm.  No murmurs, rubs, or gallops.  Respiratory: Normal respiratory effort without tachypnea nor retractions. Breath sounds are clear and equal bilaterally. No wheezes/rales/rhonchi. Gastrointestinal: Soft and non tender. No rebound. No guarding.  Genitourinary: Deferred Musculoskeletal: Normal range of motion in all extremities. No lower extremity edema. Neurologic:  Somnolent, awakens to verbal stimuli. Is non verbal at this point.  Skin:  Skin is warm, dry and intact. No rash noted.  ____________________________________________    LABS (pertinent positives/negatives)  UDS positive cannabinoid and opiate CMP na 130, k 4.1, glu 299, anion gap 11 CBC wbc 15.1, hgb 16.4, plt 202  ____________________________________________   EKG  I, Nance Pear, attending physician, personally viewed and interpreted this EKG  EKG Time: 1456 Rate: 48 Rhythm: sinus bradycardia with 1st degree av block Axis: normal Intervals: qtc 425 QRS: narrow, q waves III, aVF ST changes: no st elevation Impression: abnormal ekg  ____________________________________________    RADIOLOGY  CT head No acute abnormality  ____________________________________________   PROCEDURES  Procedures  ____________________________________________   INITIAL IMPRESSION / ASSESSMENT AND PLAN / ED COURSE  Pertinent labs & imaging results that were available during my care of the patient were reviewed by me and considered in my medical decision making (see chart for details).   Patient presented to the emergency department today because of concerns for altered mental status after eating a brownie given to her by waitress at [facility].  On initial exam patient was somewhat  somnolent.  During his stay here in the emergency department he did become more awake and alert.  UDS was positive for cannabis.  At this point I am concerned the patient inadvertently took a cannabinoid edible.  No other acute findings for altered mental status.  Will continue to observe for sobriety.  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 Any transcriptional errors that result from this process are unintentional     Nance Pear, MD 09/08/19 2037

## 2019-09-08 NOTE — ED Notes (Addendum)
This RN attempted to stand pt up and use bedside urinal. Pt was unable to stand due to weakness in legs and knees. Pt was able to use urinal while sitting on side of stretcher.

## 2019-09-08 NOTE — ED Triage Notes (Addendum)
Pt comes via [facility] from [facility] with AMS. Pt states he was at the [facility]. Pt unable to answer any other questions.  EMS reports pt was given a brownie by the waitress and shortly after felt funny, dizzy and just not feeling well.  Pt is altered and appears intoxicated and unwell. Pt not answering questions at this time and hard to arouse.  VS-BP-141/46, HR-50, 96%, BS-320

## 2019-09-08 NOTE — ED Notes (Signed)
Spoke to MD who states pt can be d/c once he is able to walk. Offered to help pt up and walk around. PT states he is still too weak. PT given sandwich tray. Will reevaluate.

## 2019-09-08 NOTE — ED Notes (Signed)
Pt unable to verbalize anything at the present moment. Vitals stable.

## 2019-09-08 NOTE — ED Notes (Signed)
Spoke with Ms. Nada Maclachlan (pt's sister) who gave Ms. Willoughby's (also pt's sister) contact info. Attempted x2 to reach Ms. Willloughby. NO success.

## 2019-09-09 ENCOUNTER — Observation Stay: Payer: Medicare PPO

## 2019-09-09 ENCOUNTER — Observation Stay
Admit: 2019-09-09 | Discharge: 2019-09-09 | Disposition: A | Discharge status: Home / Self Care | Payer: Medicare PPO | Attending: Nurse Practitioner | Admitting: Nurse Practitioner

## 2019-09-09 ENCOUNTER — Emergency Department: Payer: Medicare PPO

## 2019-09-09 DIAGNOSIS — E871 Hypo-osmolality and hyponatremia: Secondary | ICD-10-CM

## 2019-09-09 DIAGNOSIS — I10 Essential (primary) hypertension: Secondary | ICD-10-CM

## 2019-09-09 DIAGNOSIS — G9341 Metabolic encephalopathy: Secondary | ICD-10-CM | POA: Diagnosis not present

## 2019-09-09 DIAGNOSIS — Z794 Long term (current) use of insulin: Secondary | ICD-10-CM

## 2019-09-09 DIAGNOSIS — R9431 Abnormal electrocardiogram [ECG] [EKG]: Secondary | ICD-10-CM | POA: Diagnosis not present

## 2019-09-09 DIAGNOSIS — G894 Chronic pain syndrome: Secondary | ICD-10-CM | POA: Diagnosis not present

## 2019-09-09 DIAGNOSIS — I2510 Atherosclerotic heart disease of native coronary artery without angina pectoris: Secondary | ICD-10-CM | POA: Diagnosis present

## 2019-09-09 DIAGNOSIS — E118 Type 2 diabetes mellitus with unspecified complications: Secondary | ICD-10-CM

## 2019-09-09 DIAGNOSIS — D72829 Elevated white blood cell count, unspecified: Secondary | ICD-10-CM | POA: Diagnosis not present

## 2019-09-09 DIAGNOSIS — K219 Gastro-esophageal reflux disease without esophagitis: Secondary | ICD-10-CM

## 2019-09-09 DIAGNOSIS — I480 Paroxysmal atrial fibrillation: Secondary | ICD-10-CM

## 2019-09-09 DIAGNOSIS — E1165 Type 2 diabetes mellitus with hyperglycemia: Secondary | ICD-10-CM

## 2019-09-09 LAB — TROPONIN I (HIGH SENSITIVITY)
Troponin I (High Sensitivity): 5 ng/L (ref <18)
Troponin I (High Sensitivity): 4 ng/L (ref <18)
Troponin I (High Sensitivity): 6 ng/L (ref <18)

## 2019-09-09 LAB — URINALYSIS, COMPLETE (UACMP) WITH MICROSCOPIC
Bacteria, UA: NONE SEEN
Bilirubin Urine: NEGATIVE
Glucose, UA: >=500 mg/dL — AB
Hgb urine dipstick: NEGATIVE
Ketones, ur: 5 mg/dL — AB
Leukocytes,Ua: NEGATIVE
Nitrite: NEGATIVE
Protein, ur: NEGATIVE mg/dL
Specific Gravity, Urine: 1.023 (ref 1.005–1.030)
Squamous Epithelial / HPF: NONE SEEN (ref 0–5)
pH: 6 (ref 5.0–8.0)

## 2019-09-09 LAB — ECHOCARDIOGRAM COMPLETE
Height: 72 in
Weight: 3164.04 oz

## 2019-09-09 LAB — GLUCOSE, CAPILLARY
Glucose-Capillary: 160 mg/dL — ABNORMAL HIGH (ref 70–99)
Glucose-Capillary: 109 mg/dL — ABNORMAL HIGH (ref 70–99)
Glucose-Capillary: 138 mg/dL — ABNORMAL HIGH (ref 70–99)
Glucose-Capillary: 129 mg/dL — ABNORMAL HIGH (ref 70–99)

## 2019-09-09 LAB — OSMOLALITY, URINE: Osmolality, Ur: 666 mOsm/kg (ref 300–900)

## 2019-09-09 LAB — SODIUM, URINE, RANDOM: Sodium, Ur: 78 mmol/L

## 2019-09-09 LAB — TSH: TSH: 0.73 u[IU]/mL (ref 0.350–4.500)

## 2019-09-09 LAB — SARS CORONAVIRUS 2 (TAT 6-24 HRS): SARS Coronavirus 2: NEGATIVE

## 2019-09-09 LAB — OSMOLALITY: Osmolality: 296 mosm/kg — ABNORMAL HIGH (ref 275–295)

## 2019-09-09 IMAGING — DX DG CHEST 1V PORT
1 series · 1 of 1 positions shown · non-contrast
Comparison: [DATE]

CLINICAL DATA: Leukocytosis

EXAM:
PORTABLE CHEST 1 VIEW

[chest ap]
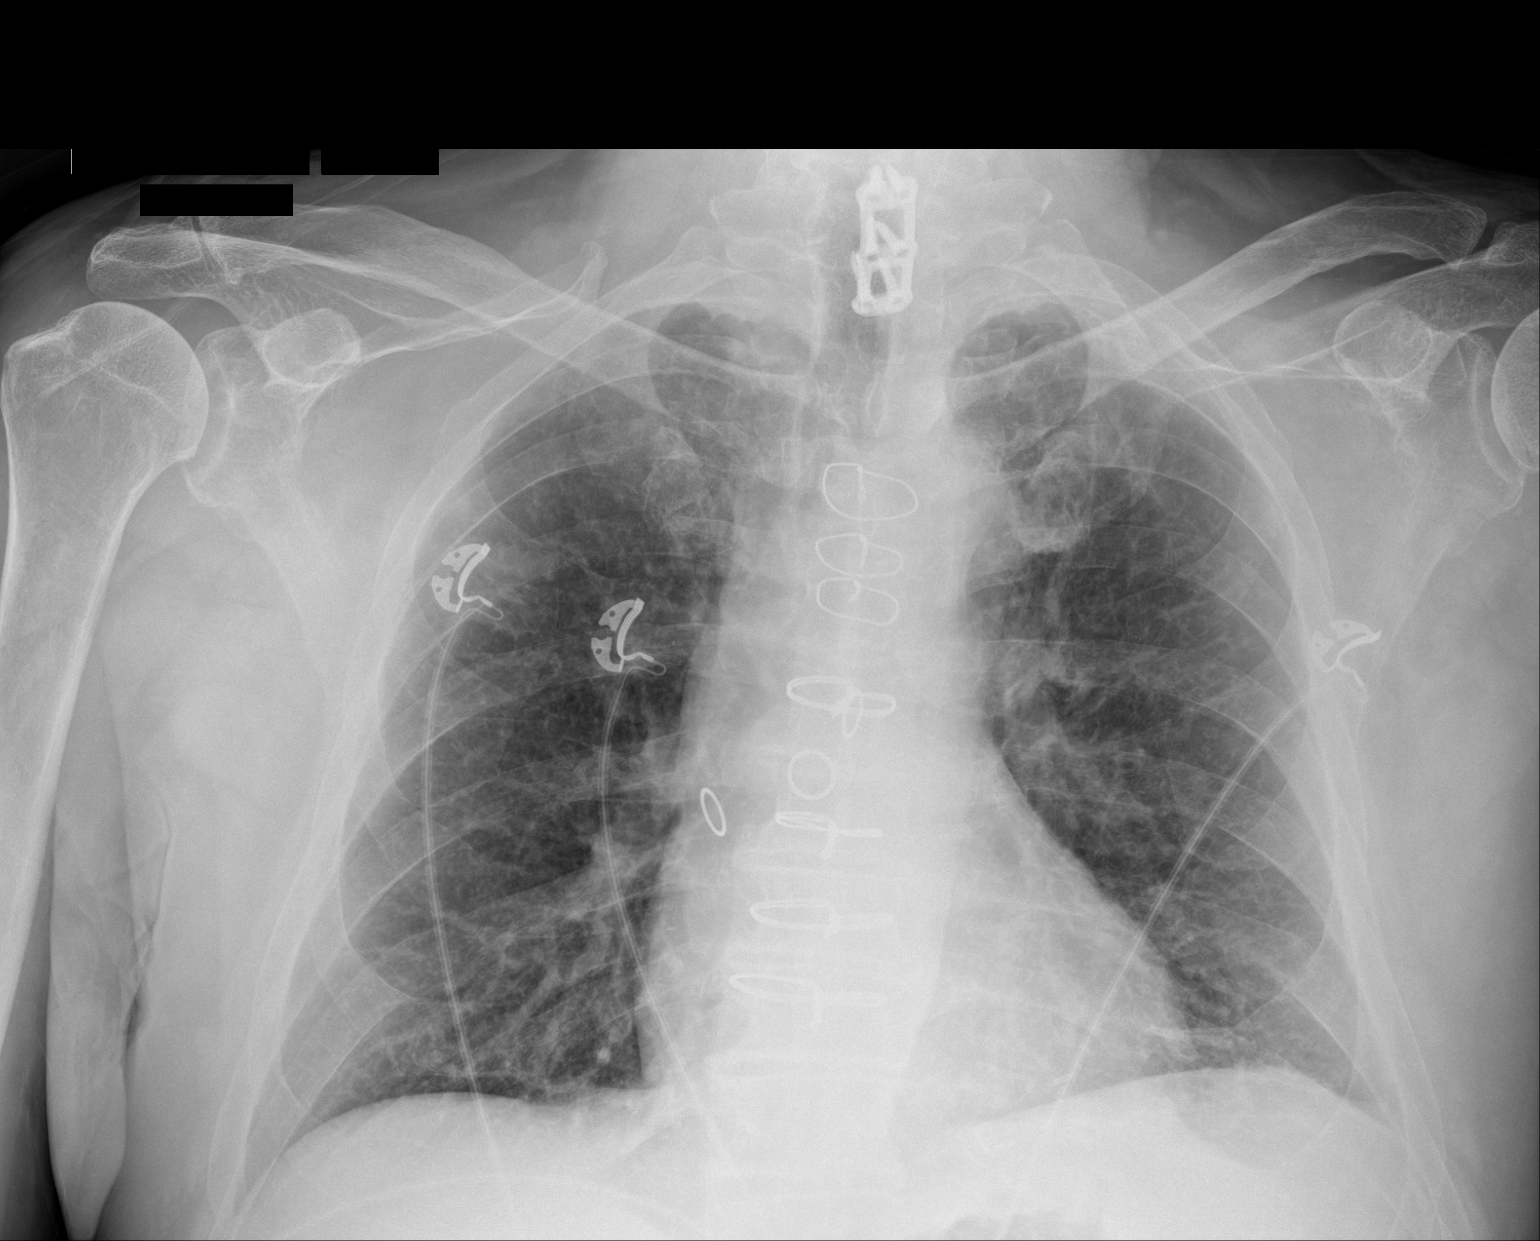

[1 of 1 positions shown; findings below may reference images not displayed]

FINDINGS: Cardiac shadow is stable. Postsurgical changes are noted. Lungs are
well aerated bilaterally. Mild interstitial changes are again seen
and stable. No focal infiltrate or sizable effusion is seen. No
acute bony abnormality is noted.
IMPRESSION: No active disease.

## 2019-09-09 IMAGING — MR MR HEAD W/O CM
11 series · 44 of 48 positions shown · non-contrast
Comparison: Head CT [DATE]

CLINICAL DATA: Encephalopathy.

EXAM:
MRI HEAD WITHOUT CONTRAST
TECHNIQUE: Multiplanar, multiecho pulse sequences of the brain and surrounding
structures were obtained without intravenous contrast.

[Series 5: ax dwi_tracew · axial · 3.0mm · 0.60mm/px · z∈[-51,+101]mm · 4 of 48 slices shown]
[im 1/48]
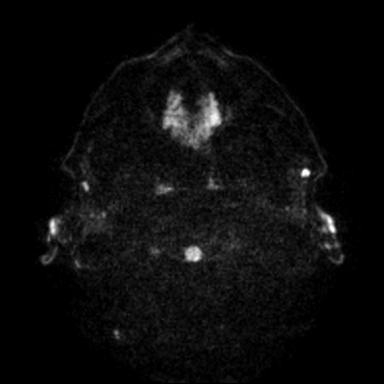
[im 16/48]
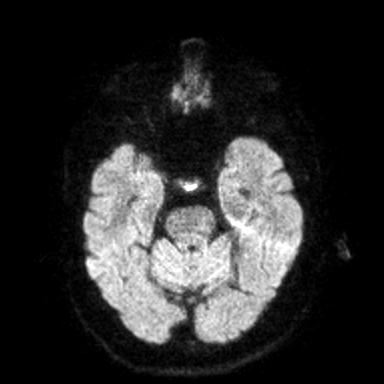
[im 32/48]
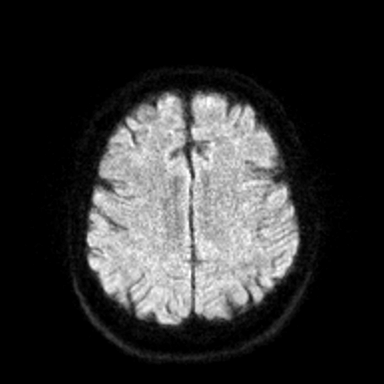
[im 48/48]
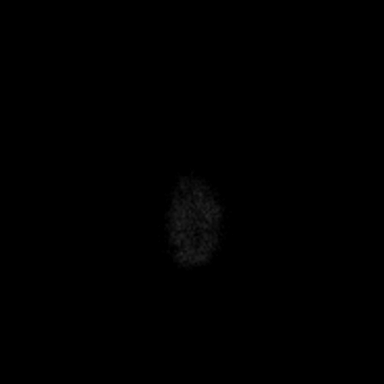

[Series 6: ax dwi_adc · axial · 3.0mm · 0.60mm/px · z∈[-51,+101]mm · 4 of 48 slices shown]
[im 1/48]
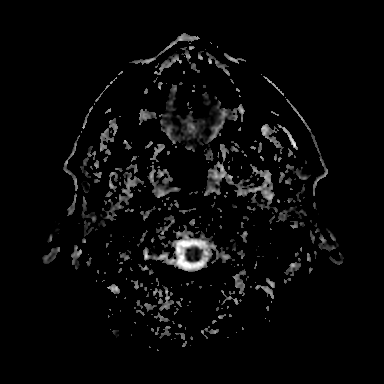
[im 16/48]
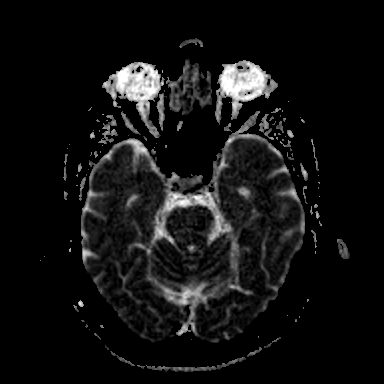
[im 32/48]
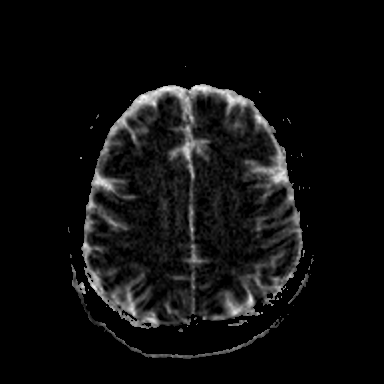
[im 48/48]
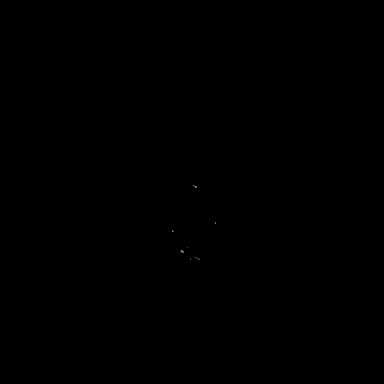

[Series 7: cor dwi_tracew · coronal · 5.0mm · 0.60mm/px · 3 of 40 slices shown]
[im 1/40]
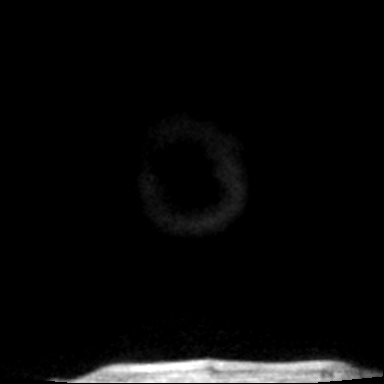
[im 20/40]
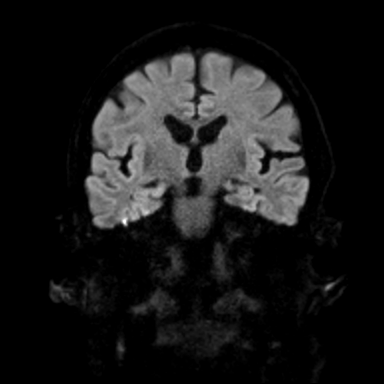
[im 40/40]
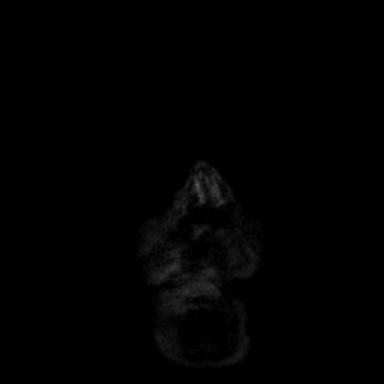

[Series 8: cor dwi_adc · coronal · 5.0mm · 0.60mm/px · 3 of 40 slices shown]
[im 1/40]
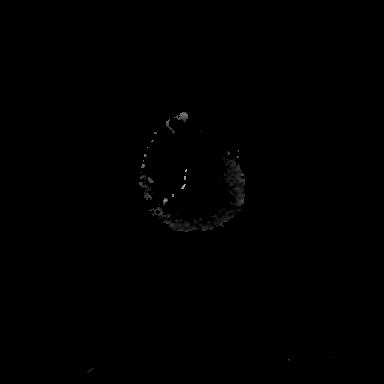
[im 20/40]
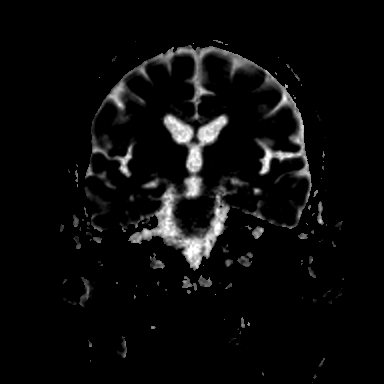
[im 40/40]
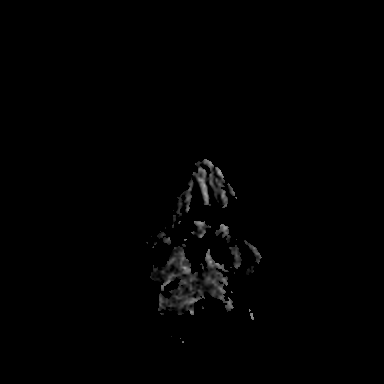

[Series 9: T1 · sagittal · 5.0mm · 0.62mm/px · 2 of 23 slices shown (1 of 2)]
[im 1/23]
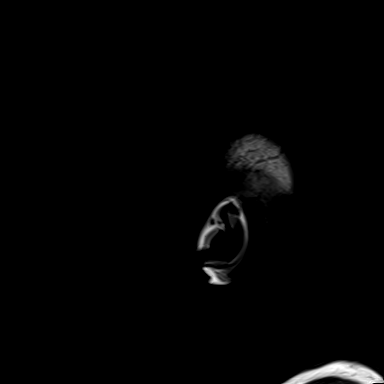
[im 23/23]
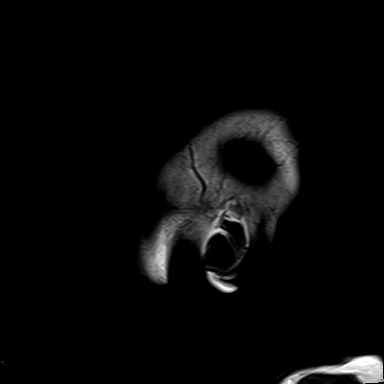

[Series 10: T2 · axial · 5.0mm · 0.53mm/px · z∈[-39,+105]mm · 2 of 26 slices shown (1 of 2)]
[im 1/26]
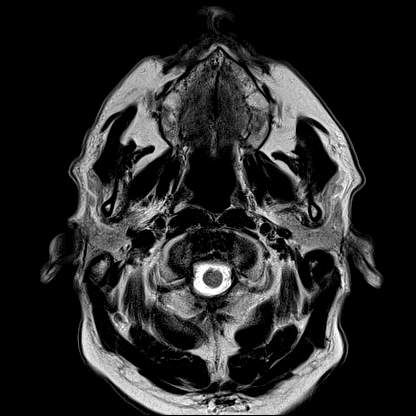
[im 26/26]
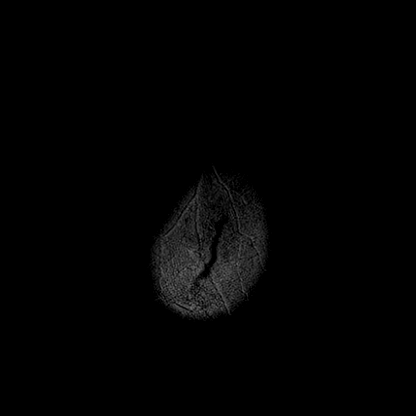

[Series 12: pha_images · axial · 3.0mm · 0.90mm/px · z∈[-51,+120]mm · 5 of 60 slices shown]
[im 1/60]
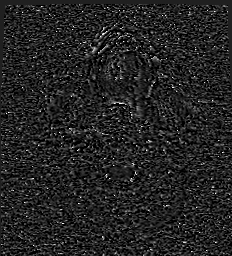
[im 15/60]
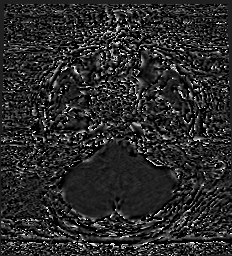
[im 30/60]
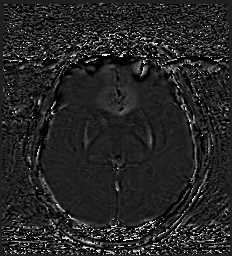
[im 45/60]
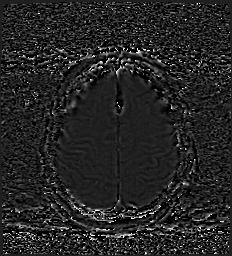
[im 60/60]
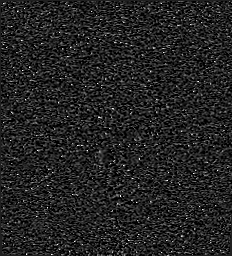

[Series 13: swi_images · axial · 3.0mm · 0.90mm/px · z∈[-51,+120]mm · 5 of 60 slices shown]
[im 1/60]
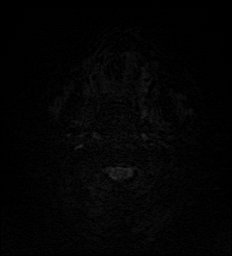
[im 15/60]
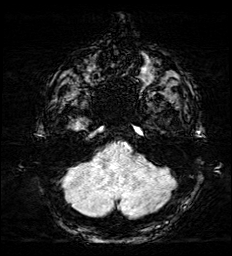
[im 30/60]
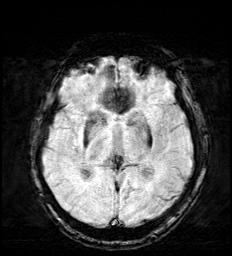
[im 45/60]
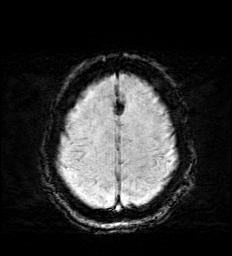
[im 60/60]
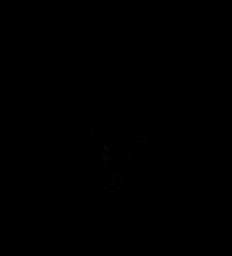

[Series 15: FLAIR · axial · 3.0mm · 0.53mm/px · z∈[-45,+111]mm · 4 of 55 slices shown]
[im 1/55]
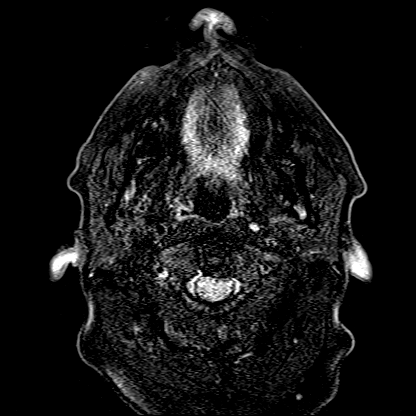
[im 19/55]
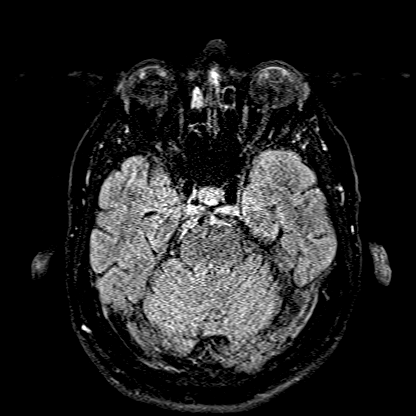
[im 37/55]
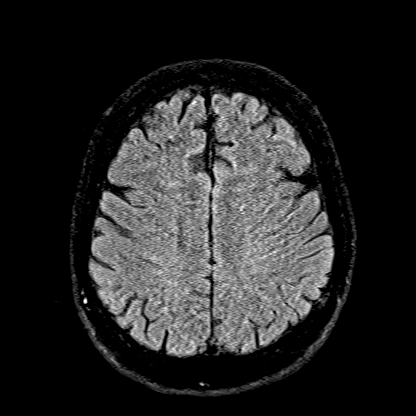
[im 55/55]
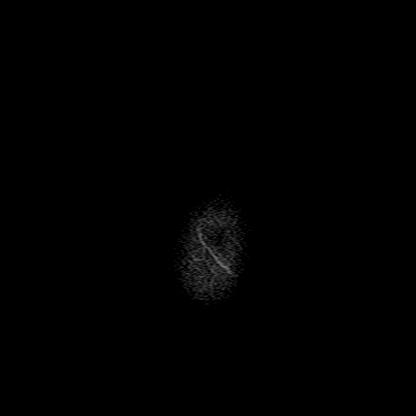

[Series 16: T1 · axial · 1.0mm · 0.98mm/px · z∈[-47,+121]mm · 10 of 176 slices shown (2 of 2)]
[im 1/176]
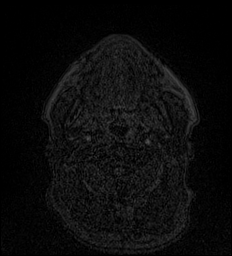
[im 14/176]
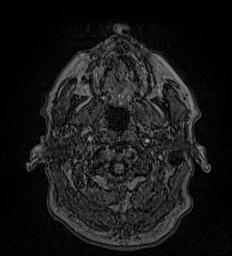
[im 27/176]
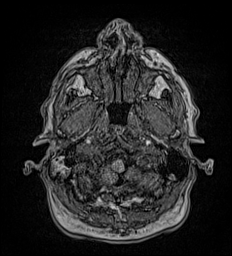
[im 41/176]
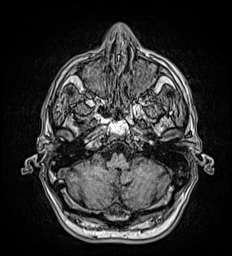
[im 54/176]
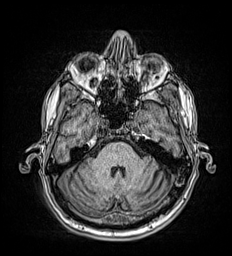
[im 81/176]
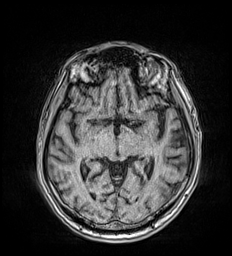
[im 95/176]
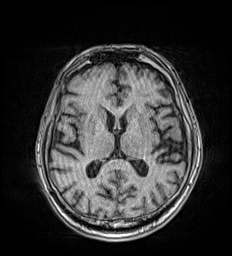
[im 122/176]
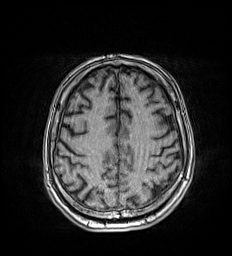
[im 149/176]
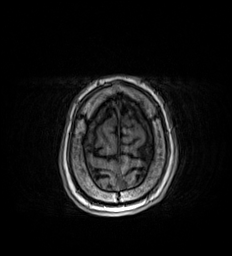
[im 176/176]
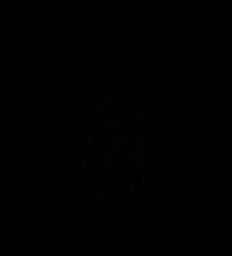

[Series 17: T2 · coronal · 5.0mm · 0.57mm/px · 2 of 29 slices shown (2 of 2)]
[im 1/29]
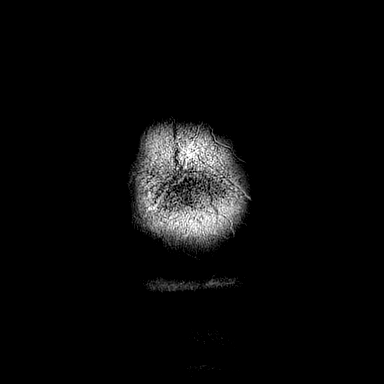
[im 29/29]
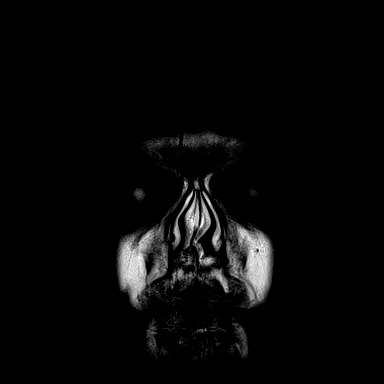

[44 of 48 positions shown; findings below may reference images not displayed]

FINDINGS: The study is mildly motion degraded.

Brain: There is no evidence of acute infarct, intracranial
hemorrhage, mass, midline shift, or extra-axial fluid collection.
Mild cerebral atrophy is within normal limits for age. No
significant white matter disease is seen for age.

Vascular: Major intracranial vascular flow voids are preserved.

Skull and upper cervical spine: Unremarkable bone marrow signal.

Sinuses/Orbits: Unremarkable orbits. Sinusitis including complete
opacification of the maxillary sinuses. Small chronic left mastoid
effusion.

Other: None.
IMPRESSION: 1. Unremarkable appearance of the brain for age.
2. Sinusitis with complete bilateral maxillary sinus opacification.

## 2019-09-09 MED ORDER — AMLODIPINE BESYLATE 10 MG PO TABS
10.0000 mg | ORAL_TABLET | Freq: Every day | ORAL | Status: DC
  Administered 2019-09-09 – 2019-09-10 (×2): 10 mg via ORAL
  Filled 2019-09-09: qty 2
  Filled 2019-09-09: qty 1

## 2019-09-09 MED ORDER — HYDRALAZINE HCL 25 MG PO TABS
25.0000 mg | ORAL_TABLET | Freq: Three times a day (TID) | ORAL | Status: DC
  Administered 2019-09-09 – 2019-09-10 (×3): 25 mg via ORAL
  Filled 2019-09-09 (×4): qty 1

## 2019-09-09 MED ORDER — INSULIN GLARGINE 100 UNIT/ML ~~LOC~~ SOLN
6.0000 [IU] | Freq: Every day | SUBCUTANEOUS | Status: DC
  Filled 2019-09-09 (×2): qty 0.06

## 2019-09-09 MED ORDER — INSULIN ASPART 100 UNIT/ML ~~LOC~~ SOLN
0.0000 [IU] | SUBCUTANEOUS | Status: DC
  Administered 2019-09-09: 1 [IU] via SUBCUTANEOUS
  Administered 2019-09-10 (×2): 2 [IU] via SUBCUTANEOUS
  Administered 2019-09-10: 1 [IU] via SUBCUTANEOUS
  Administered 2019-09-10: 2 [IU] via SUBCUTANEOUS
  Filled 2019-09-09 (×5): qty 1

## 2019-09-09 MED ORDER — ONDANSETRON HCL 4 MG/2ML IJ SOLN
4.0000 mg | Freq: Three times a day (TID) | INTRAMUSCULAR | Status: DC | PRN
  Administered 2019-09-09: 4 mg via INTRAVENOUS
  Filled 2019-09-09: qty 2

## 2019-09-09 MED ORDER — CARVEDILOL 25 MG PO TABS
25.0000 mg | ORAL_TABLET | Freq: Two times a day (BID) | ORAL | Status: DC
  Administered 2019-09-09: 25 mg via ORAL
  Filled 2019-09-09 (×2): qty 1

## 2019-09-09 MED ORDER — ONDANSETRON HCL 4 MG/2ML IJ SOLN
4.0000 mg | Freq: Once | INTRAMUSCULAR | Status: AC
  Administered 2019-09-09: 4 mg via INTRAVENOUS

## 2019-09-09 MED ORDER — HYDRALAZINE HCL 20 MG/ML IJ SOLN
5.0000 mg | INTRAMUSCULAR | Status: DC | PRN
  Filled 2019-09-09: qty 0.25

## 2019-09-09 MED ORDER — FLUTICASONE PROPIONATE 50 MCG/ACT NA SUSP
1.0000 | Freq: Two times a day (BID) | NASAL | Status: DC | PRN
  Filled 2019-09-09: qty 16

## 2019-09-09 MED ORDER — ATORVASTATIN CALCIUM 20 MG PO TABS
80.0000 mg | ORAL_TABLET | Freq: Every day | ORAL | Status: DC
  Administered 2019-09-09 – 2019-09-10 (×2): 80 mg via ORAL
  Filled 2019-09-09 (×2): qty 4

## 2019-09-09 MED ORDER — LACTATED RINGERS IV BOLUS
500.0000 mL | Freq: Once | INTRAVENOUS | Status: AC
  Administered 2019-09-09: 500 mL via INTRAVENOUS

## 2019-09-09 MED ORDER — OXYCODONE-ACETAMINOPHEN 5-325 MG PO TABS
1.0000 | ORAL_TABLET | Freq: Three times a day (TID) | ORAL | Status: DC | PRN
  Administered 2019-09-09 – 2019-09-10 (×3): 1 via ORAL
  Filled 2019-09-09 (×3): qty 1

## 2019-09-09 MED ORDER — MORPHINE SULFATE ER 15 MG PO TBCR
15.0000 mg | EXTENDED_RELEASE_TABLET | Freq: Two times a day (BID) | ORAL | Status: DC
  Administered 2019-09-09 – 2019-09-10 (×2): 15 mg via ORAL
  Filled 2019-09-09 (×2): qty 1

## 2019-09-09 MED ORDER — ACETAMINOPHEN 325 MG PO TABS
650.0000 mg | ORAL_TABLET | Freq: Four times a day (QID) | ORAL | Status: DC | PRN
  Administered 2019-09-10: 650 mg via ORAL
  Filled 2019-09-09: qty 2

## 2019-09-09 MED ORDER — MORPHINE SULFATE 15 MG PO TABS
15.0000 mg | ORAL_TABLET | Freq: Three times a day (TID) | ORAL | Status: DC
  Filled 2019-09-09: qty 1

## 2019-09-09 MED ORDER — ONDANSETRON HCL 4 MG/2ML IJ SOLN
INTRAMUSCULAR | Status: AC
  Filled 2019-09-09: qty 2

## 2019-09-09 MED ORDER — SODIUM CHLORIDE 0.9 % IV SOLN: INTRAVENOUS | Status: DC

## 2019-09-09 MED ORDER — GUAIFENESIN-DM 100-10 MG/5ML PO SYRP: 5.0000 mL | ORAL_SOLUTION | ORAL | Status: DC | PRN

## 2019-09-09 MED ORDER — RIVAROXABAN 20 MG PO TABS
20.0000 mg | ORAL_TABLET | Freq: Every day | ORAL | Status: DC
  Filled 2019-09-09 (×2): qty 1

## 2019-09-09 NOTE — ED Notes (Signed)
Pt is resting in bed, respirations are equal and unlabored. No signs of acute distress.

## 2019-09-09 NOTE — Consult Note (Addendum)
Bryan Scott is a 74 y.o. male  [id number]  Primary Cardiologist: [name] Reason for Consultation: Abnormal EKG  HPI: Patient is a 74 year old Caucasian male with past medical history of hypertension, hyperlipidemia, diabetes, paroxysmal atrial fibrillation on Xarelto, coronary artery disease, CABG, carotid artery stenosis, and angioedema s/p ACE inhibitor usage.  Patient presented to the emergency room with altered mental status.  Per the patient he was eating at a local [facility] where he was given a brownie by the waitress.  Patient states as soon as he ate this brownie he he did not feel well, went home began becoming confused and reported to the emergency room.  On UDS patient was positive for cannabinoids and opiates.  On original EKG there appeared to be possible ST elevation in the inferior leads.  Currently awaiting troponins and repeat stat EKG.  With altered mental status CT head and Brain MRI was negative for anything acute. Agree.   Review of Systems: Patient denies shortness of breath, orthopnea, or PND.  Patient does attest to a pressure in his chest that he did not experience yesterday. Patient denies drug use.   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    Angioedema   Cancer (HCC)    Carotid artery disease (HCC)    > 75% bilateral ICA stenoses on 01/2013 CT   Chronic pain syndrome    Coronary artery disease    s/p CABG ~ 2007   GERD (gastroesophageal reflux disease)    Heparin allergy    Bleeding   History of tobacco use    Hyperlipidemia    Hypertension    Ischemic cardiomyopathy    Paroxysmal atrial fibrillation (HCC)    On Xarelto anticoagulation   Type 2 diabetes mellitus (HCC)    A1C 6.8% in 01/2013    (Not in a hospital admission)     insulin aspart  0-9 Units Subcutaneous Q4H   sodium chloride flush  3 mL Intravenous Once    Infusions:  sodium chloride      Allergies  Allergen Reactions   Aspergum [Aspirin]  Anaphylaxis   Heparin Other (See Comments)    Reaction:  Bleeding    Lisinopril Swelling    Social History   Socioeconomic History   Marital status: Married    Spouse name: Not on file   Number of children: Not on file   Years of education: Not on file   Highest education level: Not on file  Occupational History   Not on file  Tobacco Use   Smoking status: Former Smoker    Packs/day: 1.00    Years: 30.00    Pack years: 30.00    Types: Cigarettes   Smokeless tobacco: Never Used   Tobacco comment: 30 pack-year history. Quit 8-9 years ago.   Substance and Sexual Activity   Alcohol use: No   Drug use: No   Sexual activity: Not on file  Other Topics Concern   Not on file  Social History Narrative   Not on file   Social Determinants of Health   Financial Resource Strain:    Difficulty of Paying Living Expenses:   Food Insecurity:    Worried About [place] in the Last Year:    Human resources officer of Food in the Last Year:   Transportation Needs:    Lack of Transportation (Medical):    Lack of Transportation (Non-Medical):   Physical Activity:    Days of  Exercise per Week:    Minutes of Exercise per Session:   Stress:    Feeling of Stress :   Social Connections:    Frequency of Communication with Friends and Family:    Frequency of Social Gatherings with Friends and Family:    Attends Religious Services:    Active Member of Clubs or Organizations:    Attends Music therapist:    Marital Status:   Intimate Partner Violence:    Fear of Current or Ex-Partner:    Emotionally Abused:    Physically Abused:    Sexually Abused:     Family History  Problem Relation Age of Onset   Heart disease Mother 77       Deceased   CVA Mother    Seizures Father 30       Deceased from complications with seizures    PHYSICAL EXAM: Vitals:   09/09/19 0330 09/09/19 0700  BP: 137/85 135/60  Pulse:    Resp: 19 18  Temp:    SpO2: 98%     No intake or output  data in the 24 hours ending 09/09/19 1036  General:  Well appearing. No respiratory difficulty HEENT: normal Neck: supple. no JVD. Carotids 2+ bilat; no bruits. No lymphadenopathy or thryomegaly appreciated. Cor: PMI nondisplaced. Regular rate & rhythm. No rubs, gallops or murmurs. Lungs: clear Abdomen: soft, nontender, nondistended. No hepatosplenomegaly. No bruits or masses. Good bowel sounds. Extremities: no cyanosis, clubbing, rash, edema Neuro: alert & oriented x 3, cranial nerves grossly intact. moves all 4 extremities w/o difficulty. Affect pleasant.  ECG: Sinus bradycardia with first-degree AV block.  1 mm ST elevation seen in lead III and aVF.  48 bpm.  Awaiting repeat EKG.  Results for orders placed or performed during the hospital encounter of 09/08/19 (from the past 24 hour(s))  Glucose, capillary     Status: Abnormal   Collection Time: 09/08/19  2:55 PM  Result Value Ref Range   Glucose-Capillary 284 (H) 70 - 99 mg/dL  Comprehensive metabolic panel     Status: Abnormal   Collection Time: 09/08/19  2:57 PM  Result Value Ref Range   Sodium 130 (L) 135 - 145 mmol/L   Potassium 4.1 3.5 - 5.1 mmol/L   Chloride 97 (L) 98 - 111 mmol/L   CO2 22 22 - 32 mmol/L   Glucose, Bld 299 (H) 70 - 99 mg/dL   BUN 27 (H) 8 - 23 mg/dL   Creatinine, Ser 1.05 0.61 - 1.24 mg/dL   Calcium 9.3 8.9 - 10.3 mg/dL   Total Protein 8.1 6.5 - 8.1 g/dL   Albumin 4.1 3.5 - 5.0 g/dL   AST 18 15 - 41 U/L   ALT 23 0 - 44 U/L   Alkaline Phosphatase 98 38 - 126 U/L   Total Bilirubin 1.3 (H) 0.3 - 1.2 mg/dL   GFR calc non Af Amer >60 >60 mL/min   GFR calc Af Amer >60 >60 mL/min   Anion gap 11 5 - 15  CBC     Status: Abnormal   Collection Time: 09/08/19  2:57 PM  Result Value Ref Range   WBC 15.1 (H) 4.0 - 10.5 K/uL   RBC 5.05 4.22 - 5.81 MIL/uL   Hemoglobin 16.4 13.0 - 17.0 g/dL   HCT 47.0 39.0 - 52.0 %   MCV 93.1 80.0 - 100.0 fL   MCH 32.5 26.0 - 34.0 pg   MCHC 34.9 30.0 - 36.0 g/dL   RDW 12.8  11.5 - 15.5 %   Platelets 202 150 - 400 K/uL   nRBC 0.0 0.0 - 0.2 %  Urine Drug Screen, Qualitative ([facility] only)     Status: Abnormal   Collection Time: 09/08/19  4:06 PM  Result Value Ref Range   Tricyclic, Ur Screen NONE DETECTED NONE DETECTED   Amphetamines, Ur Screen NONE DETECTED NONE DETECTED   MDMA (Ecstasy)Ur Screen NONE DETECTED NONE DETECTED   Cocaine Metabolite,Ur [employer] NONE DETECTED NONE DETECTED   Opiate, Ur Screen POSITIVE (A) NONE DETECTED   Phencyclidine (PCP) Ur S NONE DETECTED NONE DETECTED   Cannabinoid 50 Ng, Ur  POSITIVE (A) NONE DETECTED   Barbiturates, Ur Screen NONE DETECTED NONE DETECTED   Benzodiazepine, Ur Scrn NONE DETECTED NONE DETECTED   Methadone Scn, Ur NONE DETECTED NONE DETECTED  Glucose, capillary     Status: Abnormal   Collection Time: 09/08/19  6:15 PM  Result Value Ref Range   Glucose-Capillary 218 (H) 70 - 99 mg/dL  Glucose, capillary     Status: Abnormal   Collection Time: 09/09/19  9:37 AM  Result Value Ref Range   Glucose-Capillary 160 (H) 70 - 99 mg/dL  Urinalysis, Complete w Microscopic     Status: Abnormal   Collection Time: 09/09/19  9:46 AM  Result Value Ref Range   Color, Urine YELLOW (A) YELLOW   APPearance CLEAR (A) CLEAR   Specific Gravity, Urine 1.023 1.005 - 1.030   pH 6.0 5.0 - 8.0   Glucose, UA >=500 (A) NEGATIVE mg/dL   Hgb urine dipstick NEGATIVE NEGATIVE   Bilirubin Urine NEGATIVE NEGATIVE   Ketones, ur 5 (A) NEGATIVE mg/dL   Protein, ur NEGATIVE NEGATIVE mg/dL   Nitrite NEGATIVE NEGATIVE   Leukocytes,Ua NEGATIVE NEGATIVE   RBC / HPF 0-5 0 - 5 RBC/hpf   WBC, UA 0-5 0 - 5 WBC/hpf   Bacteria, UA NONE SEEN NONE SEEN   Squamous Epithelial / LPF NONE SEEN 0 - 5  Sodium, urine, random     Status: None   Collection Time: 09/09/19  9:46 AM  Result Value Ref Range   Sodium, Ur 78 mmol/L   CT Head Wo Contrast  Result Date: 09/08/2019 CLINICAL DATA:  74 year old male with dizziness. EXAM: CT HEAD WITHOUT CONTRAST TECHNIQUE:  Contiguous axial images were obtained from the base of the skull through the vertex without intravenous contrast. COMPARISON:  Head CT dated 10/15/2013. FINDINGS: Brain: The ventricles and sulci appropriate size for patient's age. Mild periventricular and deep white matter chronic microvascular ischemic changes noted. There is no acute intracranial hemorrhage. No mass effect or midline shift. No extra-axial fluid collection. Vascular: No hyperdense vessel or unexpected calcification. Skull: Normal. Negative for fracture or focal lesion. Sinuses/Orbits: There is complete opacification of maxillary sinuses and diffuse opacification of the ethmoid air cells. The mastoid air cells are clear. No air-fluid level. Other: None IMPRESSION: 1. No acute intracranial pathology. 2. Paranasal sinus disease. Electronically Signed   By: Anner Crete M.D.   On: 09/08/2019 20:25   MR BRAIN WO CONTRAST  Result Date: 09/09/2019 CLINICAL DATA:  Encephalopathy. EXAM: MRI HEAD WITHOUT CONTRAST TECHNIQUE: Multiplanar, multiecho pulse sequences of the brain and surrounding structures were obtained without intravenous contrast. COMPARISON:  Head CT 09/08/2019 FINDINGS: The study is mildly motion degraded. Brain: There is no evidence of acute infarct, intracranial hemorrhage, mass, midline shift, or extra-axial fluid collection. Mild cerebral atrophy is within normal limits for age. No significant white matter disease is seen for age.  Vascular: Major intracranial vascular flow voids are preserved. Skull and upper cervical spine: Unremarkable bone marrow signal. Sinuses/Orbits: Unremarkable orbits. Sinusitis including complete opacification of the maxillary sinuses. Small chronic left mastoid effusion. Other: None. IMPRESSION: 1. Unremarkable appearance of the brain for age. 2. Sinusitis with complete bilateral maxillary sinus opacification. Electronically Signed   By: Logan Bores M.D.   On: 09/09/2019 08:23   DG Chest Port 1  View  Result Date: 09/09/2019 CLINICAL DATA:  Leukocytosis EXAM: PORTABLE CHEST 1 VIEW COMPARISON:  07/16/2019 FINDINGS: Cardiac shadow is stable. Postsurgical changes are noted. Lungs are well aerated bilaterally. Mild interstitial changes are again seen and stable. No focal infiltrate or sizable effusion is seen. No acute bony abnormality is noted. IMPRESSION: No active disease. Electronically Signed   By: Inez Catalina M.D.   On: 09/09/2019 09:01     ASSESSMENT AND PLAN: On assessment patient is stable and resting on the stretcher. 09/08/19 EKG with Sinus bradycardia with first-degree AV block.  1 mm ST elevation seen in lead III and aVF.  48 bpm  With patient's report of new onset chest pain, will order stat EKG and follow troponins as they are already ordered.  Please continue to hold patient's Xarelto in case cardiac cath will be required. With patient's extensive cardiac history please continue his current home regimen of medications and will order an echocardiogram. Previous echo 06/2019 showed EF 60-65% without any significant structural abnormalities.  Patient's home dose of carvedilol already decreased due to bradycardia, will continue to titrate as tolerated. Patient's altered mental status appears to be improving and appears most likely related to possible ingestion of marijuana. Will continue to follow.  Adaline Sill, NP-C

## 2019-09-09 NOTE — ED Provider Notes (Signed)
-----------------------------------------   7:05 AM on 09/09/2019 -----------------------------------------  Blood pressure 137/85, pulse 60, temperature (!) 97.5 F (36.4 C), resp. rate 19, height 6' (1.829 m), weight 89.7 kg, SpO2 98 %.  Assuming care from Dr. Alfred Levins.  In short, Bryan Scott is a 74 y.o. male with a chief complaint of Ingestion and Altered Mental Status .  Refer to the original H&P for additional details.  The current plan of care is to follow-up MRI results for AMS.  Given that patient is not back to baseline with difficulty ambulating after extended observation, we will plan to admit.    Blake Divine, MD 09/09/19 [phone]

## 2019-09-09 NOTE — ED Notes (Signed)
This RN attempted to ambulate pt, pt was very unsteady upon standing and kept falling against the bed. Pt states that he feels too weak to ambulated. MD Alfred Levins made aware.

## 2019-09-09 NOTE — H&P (Signed)
History and Physical    Bryan Scott [id number] DOB: 1946-02-21 DOA: 09/08/2019  Referring MD/NP/PA:   PCP: [name], MD   Patient coming from:  The patient is coming from home.  At baseline, pt is independent for most of ADL.        Chief Complaint: AMS  HPI: Bryan Scott is a 74 y.o. 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  Per report, pt was at [employer]. He became altered after he ate a brownie given to him by a waitress. His mental status has slightly improved in ED. When I saw pt in ED, he is still confused, knows his own name, knows that he is in hospital, but is not orientated to the time.  He moves all extremities.  No facial droop or slurred speech.  Patient states he has pain all over.  No cough, shortness of breath, fever or chills. He has nausea, but no vomiting, diarrhea or abdominal pain.  He reports dysuria and burning on urination. I called his sister by phone, but she does not know the detail history.  ED Course: pt was found to have WBC 15.1, UDS positive for cannabinoid and opiate, pending urinalysis, pending Covid19 PCR, sodium 130, creatinine 1.05, BUN 27, temperature 97.5, blood pressure 137/85, heart rate 47-62, oxygen saturation 96% on room air.  Chest x-ray is negative for acute intracranial abnormalities.  Pending MRI of brain.  Pending chest x-ray. Pt is placed on med-surg bed for obs.  Review of Systems:   General: no fevers, chills, no body weight gain, has fatigue HEENT: no blurry vision, hearing changes or sore throat Respiratory: no dyspnea, coughing, wheezing CV: no chest pain, no palpitations GI: has nausea, no vomiting, abdominal pain, diarrhea, constipation GU: has dysuria, burning on urination, increased  urinary frequency, no hematuria  Ext: no leg edema Neuro: no unilateral weakness, numbness, or tingling, no vision change or hearing loss. Has AMS. Skin: no rash, no skin tear. MSK: No muscle spasm, no deformity, no limitation of range of movement in spin Heme: No easy bruising.  Travel history: No recent long distant travel.  Allergy:  Allergies  Allergen Reactions  . 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 Heparin Other (See Comments)    Reaction:  Bleeding   . Lisinopril Swelling    Past Medical History:  Diagnosis Date  . 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 Cancer ([place])   . Carotid artery disease (HCC)    > 75% bilateral ICA stenoses on 01/2013 CT  . Chronic pain syndrome   . Coronary artery disease    s/p CABG ~ 2007  . GERD (gastroesophageal reflux disease)   . Heparin allergy    Bleeding  . History of tobacco use   . Hyperlipidemia   . Hypertension   . Ischemic cardiomyopathy   . Paroxysmal atrial fibrillation (HCC)    On Xarelto anticoagulation  . Type 2 diabetes mellitus (HCC)    A1C 6.8% in 01/2013    Past Surgical History:  Procedure Laterality Date  . CARDIAC CATHETERIZATION    . CORONARY ARTERY BYPASS GRAFT  2007    Social History:  reports that he has quit smoking. His smoking use included cigarettes. He has a 30.00 pack-year smoking history. He has never used smokeless tobacco. He reports  that he does not drink alcohol or use drugs.  Family History:  Family History  Problem Relation Age of Onset  . Heart disease Mother 74       Deceased  . CVA Mother   . Seizures Father 39       Deceased from complications with seizures     Prior to Admission medications   Medication Sig Start Date End Date Taking? Authorizing Provider  acetaminophen (TYLENOL) 325 MG tablet Take 975 mg by mouth 3 (three) times daily as needed for mild pain or fever.    [provider]  alum & mag hydroxide-simeth (MAALOX/MYLANTA) 200-200-20 MG/5ML suspension Take 30  mLs by mouth every 6 (six) hours as needed for indigestion or heartburn. Patient not taking: Reported on 07/16/2019 03/16/15   Nicholes Mango, MD  amLODipine (NORVASC) 5 MG tablet Take 1 tablet (5 mg total) by mouth daily. 03/16/15   Gouru, Illene Silver, MD  atorvastatin (LIPITOR) 10 MG tablet Take 1 tablet (10 mg total) by mouth daily. Patient not taking: Reported on 07/16/2019 08/01/15   Dustin Flock, MD  carvedilol (COREG) 12.5 MG tablet Take 12.5 mg by mouth every 12 (twelve) hours.     [provider]  fluticasone (FLONASE) 50 MCG/ACT nasal spray Place 1 spray into both nostrils 2 (two) times daily. Stop after 10 days 07/16/19   Thornell Mule, MD  glipiZIDE (GLUCOTROL) 10 MG tablet Take 10 mg by mouth 2 (two) times daily before a meal.    [provider]  guaiFENesin-dextromethorphan (ROBITUSSIN DM) 100-10 MG/5ML syrup Take 5 mLs by mouth every 4 (four) hours as needed for cough. 07/16/19   Thornell Mule, MD  insulin aspart (NOVOLOG) 100 UNIT/ML injection Inject into the skin. Three times daily with meals. Sliding scale CBG <150 -0 unit, 151-250 5 units; 251-350 8 units    [provider]  insulin glargine (LANTUS) 100 UNIT/ML injection Inject into the skin. 10 units sq daily at bedtime    [provider]  magnesium oxide (MAG-OX) 400 MG tablet Take 400 mg by mouth daily.     [provider]  metFORMIN (GLUCOPHAGE) 500 MG tablet Take 500 mg by mouth 2 (two) times daily with a meal.    [provider]  morphine (MSIR) 15 MG tablet Take 15 mg by mouth 3 (three) times daily.     [provider]  ondansetron (ZOFRAN) 8 MG tablet Take 8 mg by mouth every 8 (eight) hours as needed for nausea or vomiting.     [provider]  oxyCODONE-acetaminophen (PERCOCET) 5-325 MG tablet Take 1 tablet by mouth every 8 (eight) hours as needed for severe pain. Patient not taking: Reported on 07/16/2019 08/01/15   Dustin Flock, MD  pantoprazole (PROTONIX)  40 MG tablet Take 40 mg by mouth 2 (two) times daily.    [provider]  rivaroxaban (XARELTO) 20 MG TABS tablet Take 1 tablet (20 mg total) by mouth daily with supper. 09/22/16   Dustin Flock, MD  saccharomyces boulardii (FLORASTOR) 250 MG capsule Take 1 capsule (250 mg total) by mouth 2 (two) times daily. 03/16/15   Nicholes Mango, MD    Physical Exam: Vitals:   09/09/19 0230 09/09/19 0300 09/09/19 0330 09/09/19 0700  BP: 118/85  137/85 135/60  Pulse:      Resp: (!) 21  19 18   Temp:      SpO2:  98% 98%   Weight:      Height:  General: Not in acute distress HEENT:       Eyes: PERRL, EOMI, no scleral icterus.       ENT: No discharge from the ears and nose, no pharynx injection, no tonsillar enlargement.        Neck: No JVD, no bruit, no mass felt. Heme: No neck lymph node enlargement. Cardiac: S1/S2, RRR, No murmurs, No gallops or rubs. Respiratory: No rales, wheezing, rhonchi or rubs. GI: Soft, nondistended, nontender, no rebound pain, no organomegaly, BS present. GU: No hematuria Ext: No pitting leg edema bilaterally. 1+DP/PT pulse bilaterally. Musculoskeletal: No joint deformities, No joint redness or warmth, no limitation of ROM in spin. Has tenderness all over Skin: No rashes.  Neuro: confused, knows his own name, knows that he is in hospital, but is not orientated to the time. Cranial nerves II-XII grossly intact, moves all extremities normally. Muscle strength 5/5 in all extremities Psych: Patient is not psychotic, no suicidal or hemocidal ideation.  Labs on Admission: I have personally reviewed following labs and imaging studies  CBC: Recent Labs  Lab 09/08/19 1457  WBC 15.1*  HGB 16.4  HCT 47.0  MCV 93.1  PLT 123XX123   Basic Metabolic Panel: Recent Labs  Lab 09/08/19 1457  NA 130*  K 4.1  CL 97*  CO2 22  GLUCOSE 299*  BUN 27*  CREATININE 1.05  CALCIUM 9.3   GFR: Estimated Creatinine Clearance: 67.7 mL/min (by C-G formula based on SCr of 1.05  mg/dL). Liver Function Tests: Recent Labs  Lab 09/08/19 1457  AST 18  ALT 23  ALKPHOS 98  BILITOT 1.3*  PROT 8.1  ALBUMIN 4.1   No results for input(s): LIPASE, AMYLASE in the last 168 hours. No results for input(s): AMMONIA in the last 168 hours. Coagulation Profile: No results for input(s): INR, PROTIME in the last 168 hours. Cardiac Enzymes: No results for input(s): CKTOTAL, CKMB, CKMBINDEX, TROPONINI in the last 168 hours. BNP (last 3 results) No results for input(s): PROBNP in the last 8760 hours. HbA1C: No results for input(s): HGBA1C in the last 72 hours. CBG: Recent Labs  Lab 09/08/19 1455 09/08/19 1815  GLUCAP 284* 218*   Lipid Profile: No results for input(s): CHOL, HDL, LDLCALC, TRIG, CHOLHDL, LDLDIRECT in the last 72 hours. Thyroid Function Tests: No results for input(s): TSH, T4TOTAL, FREET4, T3FREE, THYROIDAB in the last 72 hours. Anemia Panel: No results for input(s): VITAMINB12, FOLATE, FERRITIN, TIBC, IRON, RETICCTPCT in the last 72 hours. Urine analysis:    Component Value Date/Time   COLORURINE YELLOW (A) 09/22/2016 0800   APPEARANCEUR CLEAR (A) 09/22/2016 0800   APPEARANCEUR Clear 10/15/2013 2200   LABSPEC 1.017 09/22/2016 0800   LABSPEC 1.012 10/15/2013 2200   PHURINE 6.0 09/22/2016 0800   GLUCOSEU >=500 (A) 09/22/2016 0800   GLUCOSEU Negative 10/15/2013 2200   HGBUR SMALL (A) 09/22/2016 0800   BILIRUBINUR NEGATIVE 09/22/2016 0800   BILIRUBINUR Negative 10/15/2013 2200   KETONESUR 20 (A) 09/22/2016 0800   PROTEINUR 100 (A) 09/22/2016 0800   NITRITE NEGATIVE 09/22/2016 0800   LEUKOCYTESUR NEGATIVE 09/22/2016 0800   LEUKOCYTESUR Negative 10/15/2013 2200   Sepsis Labs: @LABRCNTIP (procalcitonin:4,lacticidven:4) )No results found for this or any previous visit (from the past 240 hour(s)).   Radiological Exams on Admission: CT Head Wo Contrast  Result Date: 09/08/2019 CLINICAL DATA:  74 year old male with dizziness. EXAM: CT HEAD WITHOUT  CONTRAST TECHNIQUE: Contiguous axial images were obtained from the base of the skull through the vertex without intravenous contrast. COMPARISON:  Head  CT dated 10/15/2013. FINDINGS: Brain: The ventricles and sulci appropriate size for patient's age. Mild periventricular and deep white matter chronic microvascular ischemic changes noted. There is no acute intracranial hemorrhage. No mass effect or midline shift. No extra-axial fluid collection. Vascular: No hyperdense vessel or unexpected calcification. Skull: Normal. Negative for fracture or focal lesion. Sinuses/Orbits: There is complete opacification of maxillary sinuses and diffuse opacification of the ethmoid air cells. The mastoid air cells are clear. No air-fluid level. Other: None IMPRESSION: 1. No acute intracranial pathology. 2. Paranasal sinus disease. Electronically Signed   By: Anner Crete M.D.   On: 09/08/2019 20:25     EKG: Independently reviewed.  Sinus rhythm, T wave inversion in lateral leads, ST elevation in inferior leads, QTC 425 Assessment/Plan Principal Problem:   Acute metabolic encephalopathy Active Problems:   Uncontrolled type 2 diabetes mellitus with complication, with long-term current use of insulin (HCC)   Hyperlipidemia   Essential hypertension   Abnormal EKG   PAF (paroxysmal atrial fibrillation) (HCC)   Hyponatremia   GERD (gastroesophageal reflux disease)   CAD (coronary artery disease)   Chronic pain syndrome   Leukocytosis  Acute metabolic encephalopathy: etiology is not clear. CT-head negative for acute issues.  Differential diagnosis include hyponatremia, UTI, stroke. -will place on med-surg bed for obs -f/u MRI-brain -frequent neuro check - f/u UA  Uncontrolled type 2 diabetes mellitus with complication, with long-term current use of insulin ([place]): Most recent A1c 8.5, poorly controled. Patient is taking metformin, glipizide, Novolog and lantus at home -will decrease Lantus dose from 10 to 8  units daily -SSI  Hyperlipidemia: -Lipitor  HTN:  -Continue home medications: coreg and amlodipine, hydralazine -decreased coreg dose from 37.5 to 25 mg bid -hold HCTZ due to  hyponatremia -hydralazine prn  Abnormal EKG and hx of CAD: pt has T wave inversion in lateral leads, ST elevation in inferior leads, QTC 425. Pt is s/p of CBAG. -will consult cardiology -trop x 3 -check A1c, FLP -repeat EKG in AM -continue lipitor  PAF (paroxysmal atrial fibrillation) ([place]): HR 47-62. -decreased coreg dose from 37.5 to 25 mg bid -hold Xarelto per Dr. Humphrey Rolls of card  Hyponatremia: this a chronic issue. Recent Na is 123-132. -IVF: received 500 ml of LR and 500 ml of NS in ED -continue NS 75 cc/h - hold HCTZ - Will check urine sodium, urine osmolality, serum osmolality. - check TSH  GERD (gastroesophageal reflux disease): -Protonix  Chronic pain syndrome: -continue home pain meds: MS contine -prn perocet  Leukocytosis: WBC 15.1. No fever. -check UA and CXR   DVT ppx: SCD Code Status: Full code per his sister Family Communication: Yes, patient's sister by phone Disposition Plan:  Anticipate discharge back to previous home environment Consults called:  Dr. Humphrey Rolls of card Admission status: Med-surg bed for obs    Date of Service 09/09/2019    [facility]   If 7PM-7AM, please contact night-coverage [email or web address] 09/09/2019, 8:02 AM

## 2019-09-09 NOTE — ED Provider Notes (Signed)
_________________________ 6:59 AM on 09/09/2019 -----------------------------------------  Patient's mental status improved throughout the night.  Patient is now alert and oriented.  Reports that he went well for house last night.  A waitress brought him a brownie.  He reports that he had not order the [place].  He ate a brownie and immediately started feeling bad.  He went home and reports that while sitting on the couch she felt like he was going to die.  He did not know who he was or where he was.  At this time is alert and oriented x3 with a GCS of 15 and completely neurologically intact.  He is lab work showed a UDS positive for cannabinoids.  Possibly from the brownie.  However patient is still unsteady, feeling dizzy.  He usually ambulates without any assistance.  CT head was negative.  Labs with no significant acute findings.  We will send patient for an MRI.  Care transferred to Dr. Charna Archer.   Bryan Scott, [place], MD 09/09/19 [phone]

## 2019-09-09 NOTE — Progress Notes (Signed)
*  PRELIMINARY RESULTS* Echocardiogram 2D Echocardiogram has been performed.  Sherrie Sport 09/09/2019, 1:44 PM

## 2019-09-10 DIAGNOSIS — D72829 Elevated white blood cell count, unspecified: Secondary | ICD-10-CM | POA: Diagnosis not present

## 2019-09-10 DIAGNOSIS — G9341 Metabolic encephalopathy: Secondary | ICD-10-CM

## 2019-09-10 LAB — CBC
HCT: 47.2 % (ref 39.0–52.0)
Hemoglobin: 16.3 g/dL (ref 13.0–17.0)
MCH: 32.3 pg (ref 26.0–34.0)
MCHC: 34.5 g/dL (ref 30.0–36.0)
MCV: 93.5 fL (ref 80.0–100.0)
Platelets: 169 10*3/uL (ref 150–400)
RBC: 5.05 MIL/uL (ref 4.22–5.81)
RDW: 13 % (ref 11.5–15.5)
WBC: 8.6 10*3/uL (ref 4.0–10.5)
nRBC: 0 % (ref 0.0–0.2)

## 2019-09-10 LAB — LIPID PANEL
Cholesterol: 232 mg/dL — ABNORMAL HIGH (ref 0–200)
HDL: 32 mg/dL — ABNORMAL LOW (ref >40)
LDL Cholesterol: 165 mg/dL — ABNORMAL HIGH (ref 0–99)
Total CHOL/HDL Ratio: 7.3 RATIO
Triglycerides: 173 mg/dL — ABNORMAL HIGH (ref <150)
VLDL: 35 mg/dL (ref 0–40)

## 2019-09-10 LAB — BASIC METABOLIC PANEL
Anion gap: 8 (ref 5–15)
BUN: 24 mg/dL — ABNORMAL HIGH (ref 8–23)
CO2: 22 mmol/L (ref 22–32)
Calcium: 8.8 mg/dL — ABNORMAL LOW (ref 8.9–10.3)
Chloride: 106 mmol/L (ref 98–111)
Creatinine, Ser: 0.94 mg/dL (ref 0.61–1.24)
GFR calc Af Amer: >60 mL/min (ref >60)
GFR calc non Af Amer: >60 mL/min (ref >60)
Glucose, Bld: 219 mg/dL — ABNORMAL HIGH (ref 70–99)
Potassium: 3.8 mmol/L (ref 3.5–5.1)
Sodium: 136 mmol/L (ref 135–145)

## 2019-09-10 LAB — GLUCOSE, CAPILLARY
Glucose-Capillary: 180 mg/dL — ABNORMAL HIGH (ref 70–99)
Glucose-Capillary: 150 mg/dL — ABNORMAL HIGH (ref 70–99)
Glucose-Capillary: 157 mg/dL — ABNORMAL HIGH (ref 70–99)
Glucose-Capillary: 154 mg/dL — ABNORMAL HIGH (ref 70–99)

## 2019-09-10 LAB — HEMOGLOBIN A1C
Hgb A1c MFr Bld: 8.3 % — ABNORMAL HIGH (ref 4.8–5.6)
Mean Plasma Glucose: 191.51 mg/dL

## 2019-09-10 LAB — URINE CULTURE: Culture: NO GROWTH

## 2019-09-10 MED ORDER — EZETIMIBE 10 MG PO TABS
10.0000 mg | ORAL_TABLET | Freq: Every day | ORAL | Status: DC
  Administered 2019-09-10: 10 mg via ORAL
  Filled 2019-09-10: qty 1

## 2019-09-10 MED ORDER — SPIRONOLACTONE 25 MG PO TABS: 25.0000 mg | ORAL_TABLET | Freq: Every day | ORAL | 0 refills | Status: DC

## 2019-09-10 MED ORDER — CARVEDILOL 12.5 MG PO TABS: 12.5000 mg | ORAL_TABLET | Freq: Two times a day (BID) | ORAL | 0 refills | Status: DC

## 2019-09-10 MED ORDER — CARVEDILOL 3.125 MG PO TABS: 6.2500 mg | ORAL_TABLET | Freq: Two times a day (BID) | ORAL | Status: DC

## 2019-09-10 MED ORDER — EZETIMIBE 10 MG PO TABS: 10.0000 mg | ORAL_TABLET | Freq: Every day | ORAL | 0 refills | Status: DC

## 2019-09-10 MED ORDER — RIVAROXABAN 20 MG PO TABS
20.0000 mg | ORAL_TABLET | Freq: Every day | ORAL | Status: DC
  Filled 2019-09-10 (×2): qty 1

## 2019-09-10 MED ORDER — SPIRONOLACTONE 25 MG PO TABS
25.0000 mg | ORAL_TABLET | Freq: Every day | ORAL | Status: DC
  Administered 2019-09-10: 25 mg via ORAL
  Filled 2019-09-10: qty 1

## 2019-09-10 MED ORDER — CARVEDILOL 12.5 MG PO TABS: 12.5000 mg | ORAL_TABLET | Freq: Two times a day (BID) | ORAL | Status: DC

## 2019-09-10 NOTE — Discharge Summary (Signed)
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 Cancer ([place])   . Carotid artery disease (HCC)    > 75% bilateral ICA stenoses on 01/2013 CT  . Chronic pain syndrome   . Coronary artery disease    s/p CABG ~ 2007  . GERD (gastroesophageal reflux disease)   . Heparin allergy    Bleeding  . History of tobacco use   . Hyperlipidemia   . Hypertension   . Ischemic cardiomyopathy   . Paroxysmal atrial fibrillation (HCC)    On Xarelto anticoagulation  . Type 2 diabetes mellitus ([place])    A1C 6.8% in 01/2013    HOSPITAL COURSE:   Acute metabolic encephalopathy: etiology is not clear. CT-head negative for acute issues.  Differential diagnosis include hyponatremia, UTI, stroke. -will place on med-surg bed for obs -f/u MRI-brain -frequent neuro check - f/u UA  Uncontrolled type 2 diabetes mellitus with complication, with long-term current use of insulin ([place]): Most recent A1c 8.5, poorly controled. Patient is taking metformin, glipizide, Novolog and lantus at home -will decrease Lantus dose from 10 to 8 units daily -SSI  Hyperlipidemia: -Lipitor  HTN:  -Continue home medications: coreg and amlodipine, hydralazine -decreased  coreg dose from 37.5 to 12.5 mg bid -hold HCTZ due to  hyponatremia -hydralazine prn  Abnormal EKG and hx of CAD: pt has T wave inversion in lateral leads, ST elevation in inferior leads, QTC 425. Pt is s/p of CBAG. -Bradycardia with HR 47-61 -decreased coreg to 12.5 mg bid (was on 37.5 mg bid) -per Cards PA ACS ruled out--ok to resume xarelto. No mention of cath in PA's note -trop x 3 negative -continue lipitor -pt denies CP  PAF (paroxysmal atrial fibrillation) ([place]): HR 47-62. -decreased coreg dose from 37.5 to 12.5 mg bid  Hyponatremia: this a chronic issue. Recent Na is 123-132. -IVF: received 500 ml of LR and 500 ml of NS in ED recieved NS 75 cc/h - hold HCTZ -na 136  GERD (gastroesophageal reflux disease): -Protonix  Chronic back pain syndrome: -continue home pain meds: MS contine -prn perocet  Leukocytosis: WBC 15.1--8.6 - No fever. -UA negative for UTI - CXR no acute abnormality  physical therapy's  recommends home health PT, rolling walker and wheelchair which has been ordered.  4:20 pm Left message for [facility] (via secure chat and phone) to see if she was able to find [facility] benefits for home health and equipment. I have not heard back from her. Discharge is on hold since above equipment is not arranged.  DVT ppx: SCD Code Status: Full code  Family Communication: none Disposition Plan:  Anticipate discharge back to previous home environment Consults called:  Dr. Humphrey Rolls of cardology   CONSULTS OBTAINED:    DRUG ALLERGIES:   Allergies  Allergen Reactions  . 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  Heparin Other (See Comments)    Reaction:  Bleeding   . Lisinopril Swelling    DISCHARGE MEDICATIONS:   Allergies as of 09/10/2019      Reactions   Aspergum [aspirin] Anaphylaxis   Heparin Other (See Comments)   Reaction:  Bleeding    Lisinopril Swelling      Medication List    TAKE these medications   acetaminophen 325 MG tablet Commonly known  as: TYLENOL Take 975 mg by mouth 3 (three) times daily as needed for mild pain or fever.   amLODipine 5 MG tablet Commonly known as: NORVASC Take 1 tablet (5 mg total) by mouth daily. What changed: how much to take   atorvastatin 80 MG tablet Commonly known as: LIPITOR Take 80 mg by mouth daily. What changed: Another medication with the same name was removed. Continue taking this medication, and follow the directions you see here.   carvedilol 12.5 MG tablet Commonly known as: COREG Take 1 tablet (12.5 mg total) by mouth every 12 (twelve) hours. What changed: how much to take   ezetimibe 10 MG tablet Commonly known as: ZETIA Take 1 tablet (10 mg total) by mouth daily. Start taking on: September 11, 2019   fluticasone 50 MCG/ACT nasal spray Commonly known as: FLONASE Place 1 spray into both nostrils 2 (two) times daily. Stop after 10 days   glipiZIDE 10 MG tablet Commonly known as: GLUCOTROL Take 10 mg by mouth 2 (two) times daily before a meal.   guaiFENesin-dextromethorphan 100-10 MG/5ML syrup Commonly known as: ROBITUSSIN DM Take 5 mLs by mouth every 4 (four) hours as needed for cough.   hydrALAZINE 25 MG tablet Commonly known as: APRESOLINE Take 25 mg by mouth 3 (three) times daily.   insulin glargine 100 UNIT/ML injection Commonly known as: LANTUS Inject into the skin. 10 units sq daily at bedtime   Jardiance 25 MG Tabs tablet Generic drug: empagliflozin Take 25 mg by mouth daily.   morphine 15 MG 12 hr tablet Commonly known as: MS CONTIN Take 15 mg by mouth every 12 (twelve) hours as needed for pain.   ondansetron 8 MG tablet Commonly known as: ZOFRAN Take 8 mg by mouth every 8 (eight) hours as needed for nausea or vomiting.   rivaroxaban 20 MG Tabs tablet Commonly known as: XARELTO Take 1 tablet (20 mg total) by mouth daily with supper.   spironolactone 25 MG tablet Commonly known as: ALDACTONE Take 1 tablet (25 mg total) by mouth daily. Start taking  on: September 11, 2019            Durable Medical Equipment  (From admission, onward)         Start     Ordered   09/10/19 1602  For home use only DME standard manual wheelchair with seat cushion  Once    Comments: Patient suffers from chronic back pain and weakness wwhich impairs their ability to perform daily activities like in the home.  A cane/walker will not resolve issue with performing activities of daily living. A wheelchair will allow patient to safely perform daily activities. Patient can safely propel the wheelchair in the home or has a caregiver who can provide assistance. Length of need indefinitely Accessories: elevating leg rests (ELRs), wheel locks, extensions and anti-tippers.   09/10/19 1601   09/10/19 1601  For home use only DME Walker rolling  Once    Question Answer Comment  Walker: With 5 Inch Wheels   Patient needs a walker to treat  with the following condition General weakness      09/10/19 1600          If you experience worsening of your admission symptoms, develop shortness of breath, life threatening emergency, suicidal or homicidal thoughts you must seek medical attention immediately by calling 911 or calling your MD immediately  if symptoms less severe.  You Must read complete instructions/literature along with all the possible adverse reactions/side effects for all the Medicines you take and that have been prescribed to you. Take any new Medicines after you have completely understood and accept all the possible adverse reactions/side effects.   Please note  You were cared for by a hospitalist during your hospital stay. If you have any questions about your discharge medications or the care you received while you were in the hospital after you are discharged, you can call the unit and asked to speak with the hospitalist on call if the hospitalist that took care of you is not available. Once you are discharged, your primary care physician will handle any  further medical issues. Please note that NO REFILLS for any discharge medications will be authorized once you are discharged, as it is imperative that you return to your primary care physician (or establish a relationship with a primary care physician if you do not have one) for your aftercare needs so that they can reassess your need for medications and monitor your lab values. Today   SUBJECTIVE   Weakness, low back pain  VITAL SIGNS:  Blood pressure (!) 143/73, pulse 61, temperature 97.6 F (36.4 C), temperature source Oral, resp. rate 18, height 6' (1.829 m), weight 89.7 kg, SpO2 98 %.  I/O:    Intake/Output Summary (Last 24 hours) at 09/10/2019 1607 Last data filed at 09/10/2019 1416 Gross per 24 hour  Intake 240 ml  Output 870 ml  Net -630 ml    PHYSICAL EXAMINATION:  GENERAL:  74 y.o.-year-old patient lying in the bed with no acute distress.  EYES: Pupils equal, round, reactive to light and accommodation. No scleral icterus.  HEENT: Head atraumatic, normocephalic. Oropharynx and nasopharynx clear.  NECK:  Supple, no jugular venous distention. No thyroid enlargement, no tenderness.  LUNGS: Normal breath sounds bilaterally, no wheezing, rales,rhonchi or crepitation. No use of accessory muscles of respiration.  CARDIOVASCULAR: S1, S2 normal. No murmurs, rubs, or gallops.  ABDOMEN: Soft, non-tender, non-distended. Bowel sounds present. No organomegaly or mass.  EXTREMITIES: No pedal edema, cyanosis, or clubbing.  NEUROLOGIC: Cranial nerves II through XII are intact. Muscle strength 5/5 in all extremities. Sensation intact. Gait not checked.  PSYCHIATRIC: The patient is alert and oriented x 3.  SKIN: No obvious rash, lesion, or ulcer.   DATA REVIEW:   CBC  Recent Labs  Lab 09/10/19 0423  WBC 8.6  HGB 16.3  HCT 47.2  PLT 169    Chemistries  Recent Labs  Lab 09/08/19 1457 09/08/19 1457 09/10/19 0423  NA 130*   < > 136  K 4.1   < > 3.8  CL 97*   < > 106  CO2 22    < > 22  GLUCOSE 299*   < > 219*  BUN 27*   < > 24*  CREATININE 1.05   < > 0.94  CALCIUM 9.3   < > 8.8*  AST 18  --   --   ALT 23  --   --   ALKPHOS 98  --   --   BILITOT 1.3*  --   --    < > =  values in this interval not displayed.    Microbiology Results   Recent Results (from the past 240 hour(s))  Urine Culture     Status: None   Collection Time: 09/08/19  4:06 PM   Specimen: Urine, Random  Result Value Ref Range Status   Specimen Description   Final    URINE, RANDOM Performed at [facility], [address], [place], [place] [address]    Special Requests   Final    NONE Performed at [facility], [address] [place], [place] [address]    Culture   Final    NO GROWTH Performed at [facility], [place] [address], [place], [place] [address]    Report Status 09/10/2019 FINAL  Final  SARS CORONAVIRUS 2 (TAT 6-24 HRS) Nasopharyngeal Nasopharyngeal Swab     Status: None   Collection Time: 09/09/19  9:46 AM   Specimen: Nasopharyngeal Swab  Result Value Ref Range Status   SARS Coronavirus 2 NEGATIVE NEGATIVE Final    Comment: (NOTE) SARS-CoV-2 target nucleic acids are NOT DETECTED. The SARS-CoV-2 RNA is generally detectable in upper and lower respiratory specimens during the acute phase of infection. Negative results do not preclude SARS-CoV-2 infection, do not rule out co-infections with other pathogens, and should not be used as the sole basis for treatment or other patient management decisions. Negative results must be combined with clinical observations, patient history, and epidemiological information. The expected result is Negative. Fact Sheet for Patients: SugarRoll.be Fact Sheet for Healthcare Providers: https://www.woods-mathews.com/ This test is not yet approved or cleared by the [place] FDA and  has been authorized for detection and/or diagnosis of SARS-CoV-2 by FDA under an Emergency  Use Authorization (EUA). This EUA will remain  in effect (meaning this test can be used) for the duration of the COVID-19 declaration under Section 56 4(b)(1) of the Act, 21 U.S.C. section 360bbb-3(b)(1), unless the authorization is terminated or revoked sooner. Performed at [facility], [place] [address], [place], [place] [address]   CULTURE, BLOOD (ROUTINE X 2) w Reflex to ID Panel     Status: None (Preliminary result)   Collection Time: 09/09/19  9:46 AM   Specimen: BLOOD  Result Value Ref Range Status   Specimen Description BLOOD BLOOD LEFT HAND  Final   Special Requests   Final    BOTTLES DRAWN AEROBIC AND ANAEROBIC Blood Culture results may not be optimal due to an inadequate volume of blood received in culture bottles   Culture   Final    NO GROWTH < 24 HOURS Performed at [facility] [address] [place], [place] [address]    Report Status PENDING  Incomplete  CULTURE, BLOOD (ROUTINE X 2) w Reflex to ID Panel     Status: None (Preliminary result)   Collection Time: 09/09/19  9:46 AM   Specimen: BLOOD  Result Value Ref Range Status   Specimen Description BLOOD BLOOD RIGHT HAND  Final   Special Requests   Final    BOTTLES DRAWN AEROBIC AND ANAEROBIC Blood Culture adequate volume   Culture   Final    NO GROWTH < 24 HOURS Performed at [facility], [address] [place],  [address]    Report Status PENDING  Incomplete    RADIOLOGY:  CT Head Wo Contrast  Result Date: 09/08/2019 CLINICAL DATA:  74 year old male with dizziness. EXAM: CT HEAD WITHOUT CONTRAST TECHNIQUE: Contiguous axial images were obtained from the base of the skull  through the vertex without intravenous contrast. COMPARISON:  Head CT dated 10/15/2013. FINDINGS: Brain: The ventricles and sulci appropriate size for patient's age. Mild periventricular and deep white matter chronic microvascular ischemic changes noted. There is no acute intracranial hemorrhage. No mass effect or  midline shift. No extra-axial fluid collection. Vascular: No hyperdense vessel or unexpected calcification. Skull: Normal. Negative for fracture or focal lesion. Sinuses/Orbits: There is complete opacification of maxillary sinuses and diffuse opacification of the ethmoid air cells. The mastoid air cells are clear. No air-fluid level. Other: None IMPRESSION: 1. No acute intracranial pathology. 2. Paranasal sinus disease. Electronically Signed   By: Anner Crete M.D.   On: 09/08/2019 20:25   MR BRAIN WO CONTRAST  Result Date: 09/09/2019 CLINICAL DATA:  Encephalopathy. EXAM: MRI HEAD WITHOUT CONTRAST TECHNIQUE: Multiplanar, multiecho pulse sequences of the brain and surrounding structures were obtained without intravenous contrast. COMPARISON:  Head CT 09/08/2019 FINDINGS: The study is mildly motion degraded. Brain: There is no evidence of acute infarct, intracranial hemorrhage, mass, midline shift, or extra-axial fluid collection. Mild cerebral atrophy is within normal limits for age. No significant white matter disease is seen for age. Vascular: Major intracranial vascular flow voids are preserved. Skull and upper cervical spine: Unremarkable bone marrow signal. Sinuses/Orbits: Unremarkable orbits. Sinusitis including complete opacification of the maxillary sinuses. Small chronic left mastoid effusion. Other: None. IMPRESSION: 1. Unremarkable appearance of the brain for age. 2. Sinusitis with complete bilateral maxillary sinus opacification. Electronically Signed   By: Logan Bores M.D.   On: 09/09/2019 08:23   DG Chest Port 1 View  Result Date: 09/09/2019 CLINICAL DATA:  Leukocytosis EXAM: PORTABLE CHEST 1 VIEW COMPARISON:  07/16/2019 FINDINGS: Cardiac shadow is stable. Postsurgical changes are noted. Lungs are well aerated bilaterally. Mild interstitial changes are again seen and stable. No focal infiltrate or sizable effusion is seen. No acute bony abnormality is noted. IMPRESSION: No active disease.  Electronically Signed   By: Inez Catalina M.D.   On: 09/09/2019 09:01   ECHOCARDIOGRAM COMPLETE  Result Date: 09/09/2019    ECHOCARDIOGRAM REPORT   Patient Name:   SHANTE DENTE Date of Exam: 09/09/2019 Medical Rec #:  [id number]    Height:       72.0 in Accession #:    [id number]   Weight:       197.8 lb Date of Birth:  June 25, 1945     BSA:          2.120 m Patient Age:    67 years     BP:           112/62 mmHg Patient Gender: M            HR:           56 bpm. Exam Location:  [facility] Procedure: 2D Echo, Cardiac Doppler and Color Doppler Indications:     Chest pain 786.50  History:         Patient has prior history of Echocardiogram examinations, most                  recent 07/16/2019. Risk Factors:Diabetes and Hypertension.                  Ischemic cardiomyopathy, PAF, tobacco use history.  Sonographer:     Sherrie Sport RDCS (AE) Referring Phys:  [id number] [place] Diagnosing Phys: [name] MD  Sonographer Comments: Technically challenging study due to limited acoustic windows and no apical window. IMPRESSIONS  1. Left  ventricular ejection fraction, by estimation, is 40 to 45%. The left ventricle has mildly decreased function. The left ventricle has no regional wall motion abnormalities. Left ventricular diastolic parameters are consistent with Grade I diastolic dysfunction (impaired relaxation).  2. Right ventricular systolic function is normal. The right ventricular size is normal.  3. The mitral valve is normal in structure. Trivial mitral valve regurgitation. No evidence of mitral stenosis.  4. The aortic valve is normal in structure. Aortic valve regurgitation is not visualized. No aortic stenosis is present.  5. The inferior vena cava is normal in size with greater than 50% respiratory variability, suggesting right atrial pressure of 3 mmHg. FINDINGS  Left Ventricle: Left ventricular ejection fraction, by estimation, is 40 to 45%. The left ventricle has mildly decreased function. The left ventricle has no  regional wall motion abnormalities. The left ventricular internal cavity size was normal in size. There is no left ventricular hypertrophy. Left ventricular diastolic parameters are consistent with Grade I diastolic dysfunction (impaired relaxation). Right Ventricle: The right ventricular size is normal. No increase in right ventricular wall thickness. Right ventricular systolic function is normal. Left Atrium: Left atrial size was normal in size. Right Atrium: Right atrial size was normal in size. Pericardium: There is no evidence of pericardial effusion. Mitral Valve: The mitral valve is normal in structure. Normal mobility of the mitral valve leaflets. Trivial mitral valve regurgitation. No evidence of mitral valve stenosis. Tricuspid Valve: The tricuspid valve is normal in structure. Tricuspid valve regurgitation is mild . No evidence of tricuspid stenosis. Aortic Valve: The aortic valve is normal in structure. Aortic valve regurgitation is not visualized. No aortic stenosis is present. Pulmonic Valve: The pulmonic valve was normal in structure. Pulmonic valve regurgitation is trivial. No evidence of pulmonic stenosis. Aorta: The aortic root is normal in size and structure. Venous: The inferior vena cava is normal in size with greater than 50% respiratory variability, suggesting right atrial pressure of 3 mmHg. IAS/Shunts: No atrial level shunt detected by color flow Doppler.  LEFT VENTRICLE PLAX 2D LVIDd:         3.72 cm LVIDs:         2.89 cm LV PW:         1.20 cm LV IVS:        1.49 cm LVOT diam:     2.00 cm LVOT Area:     3.14 cm  LEFT ATRIUM         Index LA diam:    3.80 cm 1.79 cm/m                        PULMONIC VALVE AORTA                 PV Vmax:        0.71 m/s Ao Root diam: 2.70 cm PV Peak grad:   2.0 mmHg                       RVOT Peak grad: 4 mmHg   SHUNTS Systemic Diam: 2.00 cm [name] MD Electronically signed by [name] MD Signature Date/Time: 09/09/2019/9:42:40 PM    Final       CODE STATUS:     Code Status Orders  (From admission, onward)         Start     Ordered   09/09/19 0831  Full code  Continuous     09/09/19 0830  Code Status History    Date Active Date Inactive Code Status Order ID Comments User Context   07/16/2019 0639 07/16/2019 1957 Full Code [id number]  Sidney Ace Arvella Merles, MD Inpatient   09/26/2016 2316 09/29/2016 1757 Full Code [id number]  Harvie Bridge, DO ED   09/20/2016 1745 09/24/2016 1709 Full Code [id number]  Henreitta Leber, MD Inpatient   07/31/2015 0332 08/01/2015 1832 Full Code [id number]  Harrie Foreman, MD Inpatient   03/13/2015 0251 03/16/2015 1726 Full Code [id number]  Hower, Aaron Mose, MD ED   Advance Care Planning Activity       TOTAL TIME TAKING CARE OF THIS PATIENT:  35 minutes.    Fritzi Mandes M.D  [facility]    CC: Primary care physician; [name], MD

## 2019-09-10 NOTE — Discharge Instructions (Signed)
Patient advised to follow-up with primary care physician at the [facility] also    please seek medical attention for any high fevers, chest pain, shortness of breath, change in behavior, persistent vomiting, bloody stool or any other new or concerning symptoms.

## 2019-09-10 NOTE — Evaluation (Signed)
Physical Therapy Evaluation Patient Details Name: Bryan Scott MRN: [id number] DOB: [birth year] Today's Date: 09/10/2019   History of Present Illness  74 yo male with onset of AMS and encephalopathy, had negative MRI, negative cardiology eval, has leukocytosis and noted uncontrolled DM on admission.  Has no acute findings x low pulses and potential EKG findings. PMHx:  angioedema, HTN, HLD, CABG, CAD, PAF, substance use,   Clinical Impression  Pt was seen for gait with sudden moments of buckling but not outright LOB, using RW on level surface for 20'.  Sat pt down with his complaint of light headed feeling, but O2 sat 96% and pulse 60.  Follow up with gait for stairs, and will be better able to plan services for home with therapy.      Follow Up Recommendations Home health PT;Supervision for mobility/OOB    Equipment Recommendations  Rolling walker with 5" wheels;Wheelchair (measurements PT);Wheelchair cushion (measurements PT)    Recommendations for Other Services       Precautions / Restrictions Precautions Precautions: Fall Precaution Comments: inconsistent quality gait  Restrictions Weight Bearing Restrictions: No      Mobility  Bed Mobility Overal bed mobility: Modified Independent                Transfers Overall transfer level: Modified independent Equipment used: Rolling walker (2 wheeled)             General transfer comment: able to stand and sit alone  Ambulation/Gait Ambulation/Gait assistance: Min assist Gait Distance (Feet): 20 Feet Assistive device: Rolling walker (2 wheeled);1 person hand held assist Gait Pattern/deviations: Step-to pattern;Step-through pattern;Wide base of support;Trunk flexed Gait velocity: reduced Gait velocity interpretation: <1.31 ft/sec, indicative of household ambulator General Gait Details: pt has moments of unsteadiness, interspersed by buckling of legs but not giving out completely.  LE strength is [employer]  Stairs Stairs:  (Not tested as pt reports light headedness)          Wheelchair Mobility    Modified Rankin (Stroke Patients Only)       Balance Overall balance assessment: Needs assistance   Sitting balance-Leahy Scale: Fair       Standing balance-Leahy Scale: Poor Standing balance comment: requires use of RW                             Pertinent Vitals/Pain Pain Assessment: No/denies pain    Home Living Family/patient expects to be discharged to:: Private residence Living Arrangements: Alone   Type of Home: House Home Access: Stairs to enter Entrance Stairs-Rails: Psychiatric nurse of Steps: 8 Home Layout: One level Home Equipment: Environmental consultant - 4 wheels Additional Comments: reports he does not have a lot of eqipment    Prior Function Level of Independence: Independent with assistive device(s)         Comments: used rollator when he was out     Hand Dominance   Dominant Hand: Right    Extremity/Trunk Assessment   Upper Extremity Assessment Upper Extremity Assessment: Overall WFL for tasks assessed    Lower Extremity Assessment Lower Extremity Assessment: Overall WFL for tasks assessed    Cervical / Trunk Assessment Cervical / Trunk Assessment: Normal  Communication   Communication: No difficulties  Cognition Arousal/Alertness: Awake/alert Behavior During Therapy: Impulsive Overall Cognitive Status: Within Functional Limits for tasks assessed  General Comments: pt is performing gait and mm testing inconsistently, has been able to [place] but now is having moments of buckling      General Comments General comments (skin integrity, edema, etc.): Pt is demonstrating significant issues of light headed complaints, but is having stable O2 sats of 96% with gait.  Pulses were low today but stable with standing at 60.       Exercises     Assessment/Plan    PT Assessment Patient needs  continued PT services  PT Problem List Decreased activity tolerance;Decreased coordination       PT Treatment Interventions DME instruction;Gait training;Stair training;Functional mobility training;Therapeutic activities;Therapeutic exercise;Balance training;Neuromuscular re-education;Patient/family education    PT Goals (Current goals can be found in the Care Plan section)  Acute Rehab PT Goals Patient Stated Goal: to have more help at home PT Goal Formulation: With patient Time For Goal Achievement: 09/24/19 Potential to Achieve Goals: Good    Frequency Min 2X/week   Barriers to discharge Inaccessible home environment reports 8 steps to enter house    Co-evaluation               AM-PAC PT "6 Clicks" Mobility  Outcome Measure Help needed turning from your back to your side while in a flat bed without using bedrails?: None Help needed moving from lying on your back to sitting on the side of a flat bed without using bedrails?: None Help needed moving to and from a bed to a chair (including a wheelchair)?: A Little Help needed standing up from a chair using your arms (e.g., wheelchair or bedside chair)?: A Little Help needed to walk in hospital room?: A Little Help needed climbing 3-5 steps with a railing? : Total 6 Click Score: 18    End of Session Equipment Utilized During Treatment: Gait belt Activity Tolerance: Patient tolerated treatment well;Other (comment)(light headed) Patient left: in bed;with call bell/phone within reach;with bed alarm set Nurse Communication: Mobility status PT Visit Diagnosis: Unsteadiness on feet (R26.81);Ataxic gait (R26.0)    Time: [id number] PT Time Calculation (min) (ACUTE ONLY): 24 min   Charges:   PT Evaluation $PT Eval Moderate Complexity: 1 Mod PT Treatments $Gait Training: 8-22 mins       Ramond Dial 09/10/2019, 3:52 PM  Mee Hives, PT MS Acute Rehab Dept. Number: [place]

## 2019-09-10 NOTE — Progress Notes (Signed)
SUBJECTIVE: Patient resting comfortably in bed. No acute events overnight. Patient denies chest pain or shortness of breath. Patient does describe his concerns of going home and is requesting to go to a rehab facility prior to discharge.   Vitals:   09/09/19 1743 09/09/19 2230 09/10/19 0517 09/10/19 0835  BP: (!) 156/84 133/66 140/76 (!) 142/73  Pulse: (!) 57 (!) 56 (!) 52 (!) 47  Resp: 17 16 16 16   Temp: 97.7 F (36.5 C) 98.3 F (36.8 C)  98 F (36.7 C)  TempSrc: Oral Oral  Oral  SpO2: 100% 98% 98% 98%  Weight:      Height:        Intake/Output Summary (Last 24 hours) at 09/10/2019 0950 Last data filed at 09/09/2019 2230 Gross per 24 hour  Intake --  Output 420 ml  Net -420 ml    LABS: Basic Metabolic Panel: Recent Labs    09/08/19 1457 09/10/19 0423  NA 130* 136  K 4.1 3.8  CL 97* 106  CO2 22 22  GLUCOSE 299* 219*  BUN 27* 24*  CREATININE 1.05 0.94  CALCIUM 9.3 8.8*   Liver Function Tests: Recent Labs    09/08/19 1457  AST 18  ALT 23  ALKPHOS 98  BILITOT 1.3*  PROT 8.1  ALBUMIN 4.1   No results for input(s): LIPASE, AMYLASE in the last 72 hours. CBC: Recent Labs    09/08/19 1457 09/10/19 0423  WBC 15.1* 8.6  HGB 16.4 16.3  HCT 47.0 47.2  MCV 93.1 93.5  PLT 202 169   Cardiac Enzymes: No results for input(s): CKTOTAL, CKMB, CKMBINDEX, TROPONINI in the last 72 hours. BNP: Invalid input(s): POCBNP D-Dimer: No results for input(s): DDIMER in the last 72 hours. Hemoglobin A1C: Recent Labs    09/10/19 0423  HGBA1C 8.3*   Fasting Lipid Panel: Recent Labs    09/10/19 0423  CHOL 232*  HDL 32*  LDLCALC 165*  TRIG 173*  CHOLHDL 7.3   Thyroid Function Tests: Recent Labs    09/09/19 0946  TSH 0.730   Anemia Panel: No results for input(s): VITAMINB12, FOLATE, FERRITIN, TIBC, IRON, RETICCTPCT in the last 72 hours.   PHYSICAL EXAM General: Well developed, well nourished, in no acute distress HEENT:  Normocephalic and atramatic Neck:   No JVD.  Lungs: Clear bilaterally to auscultation and percussion. Heart: HRRR . Normal S1 and S2 without gallops or murmurs.  Abdomen: Bowel sounds are positive, abdomen soft and non-tender  Msk:  Back normal, normal gait. Normal strength and tone for age. Extremities: No clubbing, cyanosis or edema.   Neuro: Alert and oriented X 3. Psych:  Good affect, responds appropriately  TELEMETRY: SB 50s  ASSESSMENT AND PLAN: Patient presented to the ED with AMS and stated he was also having chest pain. ACS and NSTEMI have been ruled out. Repeat echo did show a reduction in EF from previous study in 01/21 - 60-65% to 40-45% without any further structural abnormalities.. With the patient's history of angioedema, will not start an ACEi/ARB/ARNi on the patient at this time. Will start Spironolactone 25mg  Daily in the setting of HFrEF. If the patient becomes hypotensive, please decrease his hydralazine dose first. Please maintain his current coreg dose at 25mg  BID and only hold is HR <50. Patient also with extensive cardiac history of CAD/CABG with increased LDL. Will also start Zetia 10mg  daily in addition to his current Atorvastatin dose. Please continue his Xarelto for his PAF. Will continue to monitor the patient.  Principal Problem:   Acute metabolic encephalopathy Active Problems:   Uncontrolled type 2 diabetes mellitus with complication, with long-term current use of insulin (HCC)   Hyperlipidemia   Essential hypertension   Abnormal EKG   PAF (paroxysmal atrial fibrillation) (HCC)   Hyponatremia   GERD (gastroesophageal reflux disease)   CAD (coronary artery disease)   Chronic pain syndrome   Leukocytosis    Bryan Sill, NP-C 09/10/2019  9:50 AM

## 2019-09-10 NOTE — Progress Notes (Signed)
Patient ID: Bryan Scott, male   DOB: 10-09-1945, 73 y.o.   MRN: [id number]  I have [place], cardiology to see if any further cardiac work needs to be done. Waiting for response

## 2019-09-10 NOTE — Progress Notes (Signed)
Patient's discharge is on hold do to recommended DME. Have not heard from case management in regards to this.

## 2019-09-10 NOTE — Progress Notes (Signed)
Patient is being discharged this evening. DME was arranged. DC & Rx instructions given and patient acknowledged understanding. Family friend will pick up patient. IV's removed. Belongings packed. NT will help patient get dressed.

## 2019-09-10 NOTE — Progress Notes (Signed)
Patient ID: Bryan Scott, male   DOB: 1945-09-11, 74 y.o.   MRN: [id number]  Spoke with patient's sister. Bryan Scott  on the phone sister wanted patient to be transferred to the [place]. I explained her the whole hospital course and given his workup, labs, vital signs, evaluation here in the hospital we may not take patient and transfer since he is at baseline. Sister was worried about how patient will go home since he has eight stairs to climb. She lives out of town. At this time of the days she is not able to get anybody to come pick him up from the hospital. Spoke with social worker to see if EMS can be arranged for transportation.-- She recommended to call and try. Discussed with patient's RN to see if EMS can take patient home.  Home health PT has been arranged. Patient already has a rolling walker. Will Berneta Sages be delivered.   Went and talked again with Bryan Scott explain him and he is in agreement with the plan. Denies any complaints at present.

## 2019-09-10 NOTE — TOC Progression Note (Signed)
Transition of Care [facility]) - CM/SW Discharge Note   Patient Details  Name: Bryan Scott MRN: [id number] Date of Birth: December 08, 1945  [facility]) CM/SW Contact:  Boris Sharper, LCSW Phone Number:[phone] 09/10/2019, 4:59 PM   Clinical Narrative:    CSW called to notify pt's sister of discharge and she did not agree with him being discharged. CSW provided the MD's number to the sister for her to call and follow up with her concerns. Pt's sister stated she wants him to be transferred to the [facility] hospital.  [facility] will continue to follow for discharge planning needs.   Final next level of care: [place] Barriers to Discharge: No Barriers Identified   Patient Goals and CMS Choice Patient states their goals for this hospitalization and ongoing recovery are:: to get stronger CMS Medicare.gov Compare Post Acute Care list provided to:: Patient Choice offered to / list presented to : Patient  Discharge Placement                Patient to be transferred to facility by: EMS Name of family member notified: Iris Patient and family notified of of transfer: 09/10/19  Discharge Plan and Services                DME Arranged: Gilford Rile rolling DME Agency: [employer] Date DME Agency Contacted: 09/10/19 Time DME Agency Contacted: [phone] Representative spoke with at DME Agency: [place]: PT [place]: [facility] [place] Date [place]: 09/10/19 Time [place]: [place] Representative spoke with at [place]: [place] ([place]) Interventions     Readmission Risk Interventions No flowsheet data found.

## 2019-09-10 NOTE — TOC Transition Note (Signed)
Transition of Care [facility]) - CM/SW Discharge Note   Patient Details  Name: RONRICO MANAHAN MRN: [id number] Date of Birth: 03-06-1946  [facility] of [facility]) CM/SW Contact:  Boris Sharper, LCSW Phone Number:[phone] 09/10/2019, 5:29 PM   Clinical Narrative:     Pt medically stable for discharge. HE will be transported by EMS to his home. Pt need RW and Wheelchair Brad aware.  CSW contacted Brittney at [facility] for [facility] services. [facility]  to follow for [facility] PT.  Final next level of care: [place] Barriers to Discharge: No Barriers Identified   Patient Goals and CMS Choice Patient states their goals for this hospitalization and ongoing recovery are:: to get stronger CMS Medicare.gov Compare Post Acute Care list provided to:: Patient Choice offered to / list presented to : Patient  Discharge Placement                Patient to be transferred to facility by: EMS Name of family member notified: Iris Patient and family notified of of transfer: 09/10/19  Discharge Plan and Services                DME Arranged: Gilford Rile rolling DME Agency: [employer] Date DME Agency Contacted: 09/10/19 Time DME Agency Contacted: [phone] Representative spoke with at DME Agency: [place]: PT [place]: Well [place] Date [place]: 09/10/19 Time [place]: [place] Representative spoke with at [place]: [place] ([place]) Interventions     Readmission Risk Interventions No flowsheet data found.

## 2019-09-10 NOTE — Plan of Care (Signed)

## 2019-09-14 LAB — CULTURE, BLOOD (ROUTINE X 2)
Culture: NO GROWTH
Culture: NO GROWTH
Special Requests: ADEQUATE

## 2019-12-12 ENCOUNTER — Observation Stay: Payer: Medicare PPO

## 2019-12-12 ENCOUNTER — Emergency Department: Payer: Medicare PPO

## 2019-12-12 ENCOUNTER — Other Ambulatory Visit: Payer: Self-pay

## 2019-12-12 ENCOUNTER — Observation Stay
Admission: EM | Admit: 2019-12-12 | Discharge: 2019-12-13 | Disposition: A | Discharge status: Home / Self Care | Payer: Medicare PPO | Attending: Internal Medicine | Admitting: Internal Medicine

## 2019-12-12 DIAGNOSIS — E119 Type 2 diabetes mellitus without complications: Secondary | ICD-10-CM | POA: Diagnosis not present

## 2019-12-12 DIAGNOSIS — E876 Hypokalemia: Secondary | ICD-10-CM | POA: Insufficient documentation

## 2019-12-12 DIAGNOSIS — Z886 Allergy status to analgesic agent status: Secondary | ICD-10-CM | POA: Insufficient documentation

## 2019-12-12 DIAGNOSIS — Z8719 Personal history of other diseases of the digestive system: Secondary | ICD-10-CM | POA: Diagnosis not present

## 2019-12-12 DIAGNOSIS — Z794 Long term (current) use of insulin: Secondary | ICD-10-CM | POA: Diagnosis not present

## 2019-12-12 DIAGNOSIS — I708 Atherosclerosis of other arteries: Secondary | ICD-10-CM | POA: Insufficient documentation

## 2019-12-12 DIAGNOSIS — Z87891 Personal history of nicotine dependence: Secondary | ICD-10-CM | POA: Insufficient documentation

## 2019-12-12 DIAGNOSIS — Z888 Allergy status to other drugs, medicaments and biological substances status: Secondary | ICD-10-CM | POA: Insufficient documentation

## 2019-12-12 DIAGNOSIS — R202 Paresthesia of skin: Secondary | ICD-10-CM | POA: Diagnosis not present

## 2019-12-12 DIAGNOSIS — I2510 Atherosclerotic heart disease of native coronary artery without angina pectoris: Secondary | ICD-10-CM | POA: Insufficient documentation

## 2019-12-12 DIAGNOSIS — N200 Calculus of kidney: Secondary | ICD-10-CM | POA: Diagnosis not present

## 2019-12-12 DIAGNOSIS — Z20822 Contact with and (suspected) exposure to covid-19: Secondary | ICD-10-CM | POA: Insufficient documentation

## 2019-12-12 DIAGNOSIS — I10 Essential (primary) hypertension: Secondary | ICD-10-CM | POA: Diagnosis not present

## 2019-12-12 DIAGNOSIS — R079 Chest pain, unspecified: Secondary | ICD-10-CM | POA: Diagnosis present

## 2019-12-12 DIAGNOSIS — K219 Gastro-esophageal reflux disease without esophagitis: Secondary | ICD-10-CM

## 2019-12-12 DIAGNOSIS — Z951 Presence of aortocoronary bypass graft: Secondary | ICD-10-CM | POA: Insufficient documentation

## 2019-12-12 DIAGNOSIS — Z87892 Personal history of anaphylaxis: Secondary | ICD-10-CM | POA: Insufficient documentation

## 2019-12-12 DIAGNOSIS — I700 Atherosclerosis of aorta: Secondary | ICD-10-CM | POA: Diagnosis not present

## 2019-12-12 DIAGNOSIS — Z955 Presence of coronary angioplasty implant and graft: Secondary | ICD-10-CM | POA: Diagnosis not present

## 2019-12-12 DIAGNOSIS — Z7901 Long term (current) use of anticoagulants: Secondary | ICD-10-CM | POA: Insufficient documentation

## 2019-12-12 DIAGNOSIS — I255 Ischemic cardiomyopathy: Secondary | ICD-10-CM | POA: Diagnosis not present

## 2019-12-12 DIAGNOSIS — I440 Atrioventricular block, first degree: Secondary | ICD-10-CM | POA: Diagnosis not present

## 2019-12-12 DIAGNOSIS — Z79899 Other long term (current) drug therapy: Secondary | ICD-10-CM | POA: Insufficient documentation

## 2019-12-12 DIAGNOSIS — E785 Hyperlipidemia, unspecified: Secondary | ICD-10-CM | POA: Insufficient documentation

## 2019-12-12 DIAGNOSIS — E1151 Type 2 diabetes mellitus with diabetic peripheral angiopathy without gangrene: Secondary | ICD-10-CM

## 2019-12-12 DIAGNOSIS — R109 Unspecified abdominal pain: Secondary | ICD-10-CM | POA: Insufficient documentation

## 2019-12-12 DIAGNOSIS — I252 Old myocardial infarction: Secondary | ICD-10-CM | POA: Insufficient documentation

## 2019-12-12 DIAGNOSIS — I160 Hypertensive urgency: Secondary | ICD-10-CM | POA: Insufficient documentation

## 2019-12-12 DIAGNOSIS — I480 Paroxysmal atrial fibrillation: Secondary | ICD-10-CM | POA: Insufficient documentation

## 2019-12-12 DIAGNOSIS — G894 Chronic pain syndrome: Secondary | ICD-10-CM | POA: Diagnosis not present

## 2019-12-12 DIAGNOSIS — Z7984 Long term (current) use of oral hypoglycemic drugs: Secondary | ICD-10-CM | POA: Diagnosis not present

## 2019-12-12 DIAGNOSIS — Z8249 Family history of ischemic heart disease and other diseases of the circulatory system: Secondary | ICD-10-CM | POA: Insufficient documentation

## 2019-12-12 LAB — BASIC METABOLIC PANEL
Anion gap: 12 (ref 5–15)
BUN: 16 mg/dL (ref 8–23)
CO2: 21 mmol/L — ABNORMAL LOW (ref 22–32)
Calcium: 9.5 mg/dL (ref 8.9–10.3)
Chloride: 103 mmol/L (ref 98–111)
Creatinine, Ser: 0.98 mg/dL (ref 0.61–1.24)
GFR calc Af Amer: >60 mL/min (ref >60)
GFR calc non Af Amer: >60 mL/min (ref >60)
Glucose, Bld: 226 mg/dL — ABNORMAL HIGH (ref 70–99)
Potassium: 3.4 mmol/L — ABNORMAL LOW (ref 3.5–5.1)
Sodium: 136 mmol/L (ref 135–145)

## 2019-12-12 LAB — CBC
HCT: 48.2 % (ref 39.0–52.0)
Hemoglobin: 16.8 g/dL (ref 13.0–17.0)
MCH: 32.5 pg (ref 26.0–34.0)
MCHC: 34.9 g/dL (ref 30.0–36.0)
MCV: 93.2 fL (ref 80.0–100.0)
Platelets: 214 10*3/uL (ref 150–400)
RBC: 5.17 MIL/uL (ref 4.22–5.81)
RDW: 13.2 % (ref 11.5–15.5)
WBC: 10.8 10*3/uL — ABNORMAL HIGH (ref 4.0–10.5)
nRBC: 0 % (ref 0.0–0.2)

## 2019-12-12 LAB — NM MYOCAR MULTI W/SPECT W/WALL MOTION / EF
Estimated workload: 1 METS
Exercise duration (min): 1 min
Exercise duration (sec): 7 s
LV dias vol: 70 mL (ref 62–150)
LV sys vol: 24 mL
Peak HR: 82 {beats}/min
Percent HR: 56 %
Rest HR: 58 {beats}/min
SDS: 5
SRS: 0
SSS: 2
TID: 0.94

## 2019-12-12 LAB — TROPONIN I (HIGH SENSITIVITY)
Troponin I (High Sensitivity): 6 ng/L (ref <18)
Troponin I (High Sensitivity): 5 ng/L (ref <18)

## 2019-12-12 LAB — SARS CORONAVIRUS 2 BY RT PCR (HOSPITAL ORDER, PERFORMED IN ~~LOC~~ HOSPITAL LAB): SARS Coronavirus 2: NEGATIVE

## 2019-12-12 LAB — HEPATIC FUNCTION PANEL
ALT: 27 U/L (ref 0–44)
AST: 22 U/L (ref 15–41)
Albumin: 4 g/dL (ref 3.5–5.0)
Alkaline Phosphatase: 86 U/L (ref 38–126)
Bilirubin, Direct: 0.1 mg/dL (ref 0.0–0.2)
Indirect Bilirubin: 0.9 mg/dL (ref 0.3–0.9)
Total Bilirubin: 1 mg/dL (ref 0.3–1.2)
Total Protein: 7.9 g/dL (ref 6.5–8.1)

## 2019-12-12 LAB — GLUCOSE, CAPILLARY
Glucose-Capillary: 161 mg/dL — ABNORMAL HIGH (ref 70–99)
Glucose-Capillary: 225 mg/dL — ABNORMAL HIGH (ref 70–99)
Glucose-Capillary: 139 mg/dL — ABNORMAL HIGH (ref 70–99)

## 2019-12-12 LAB — BRAIN NATRIURETIC PEPTIDE: B Natriuretic Peptide: 57.5 pg/mL (ref 0.0–100.0)

## 2019-12-12 LAB — LIPASE, BLOOD: Lipase: 50 U/L (ref 11–51)

## 2019-12-12 IMAGING — DX DG CHEST 1V PORT
1 series · 1 of 1 positions shown · non-contrast
Comparison: [DATE]

CLINICAL DATA: Chest pain

EXAM:
PORTABLE CHEST 1 VIEW

[chest ap]
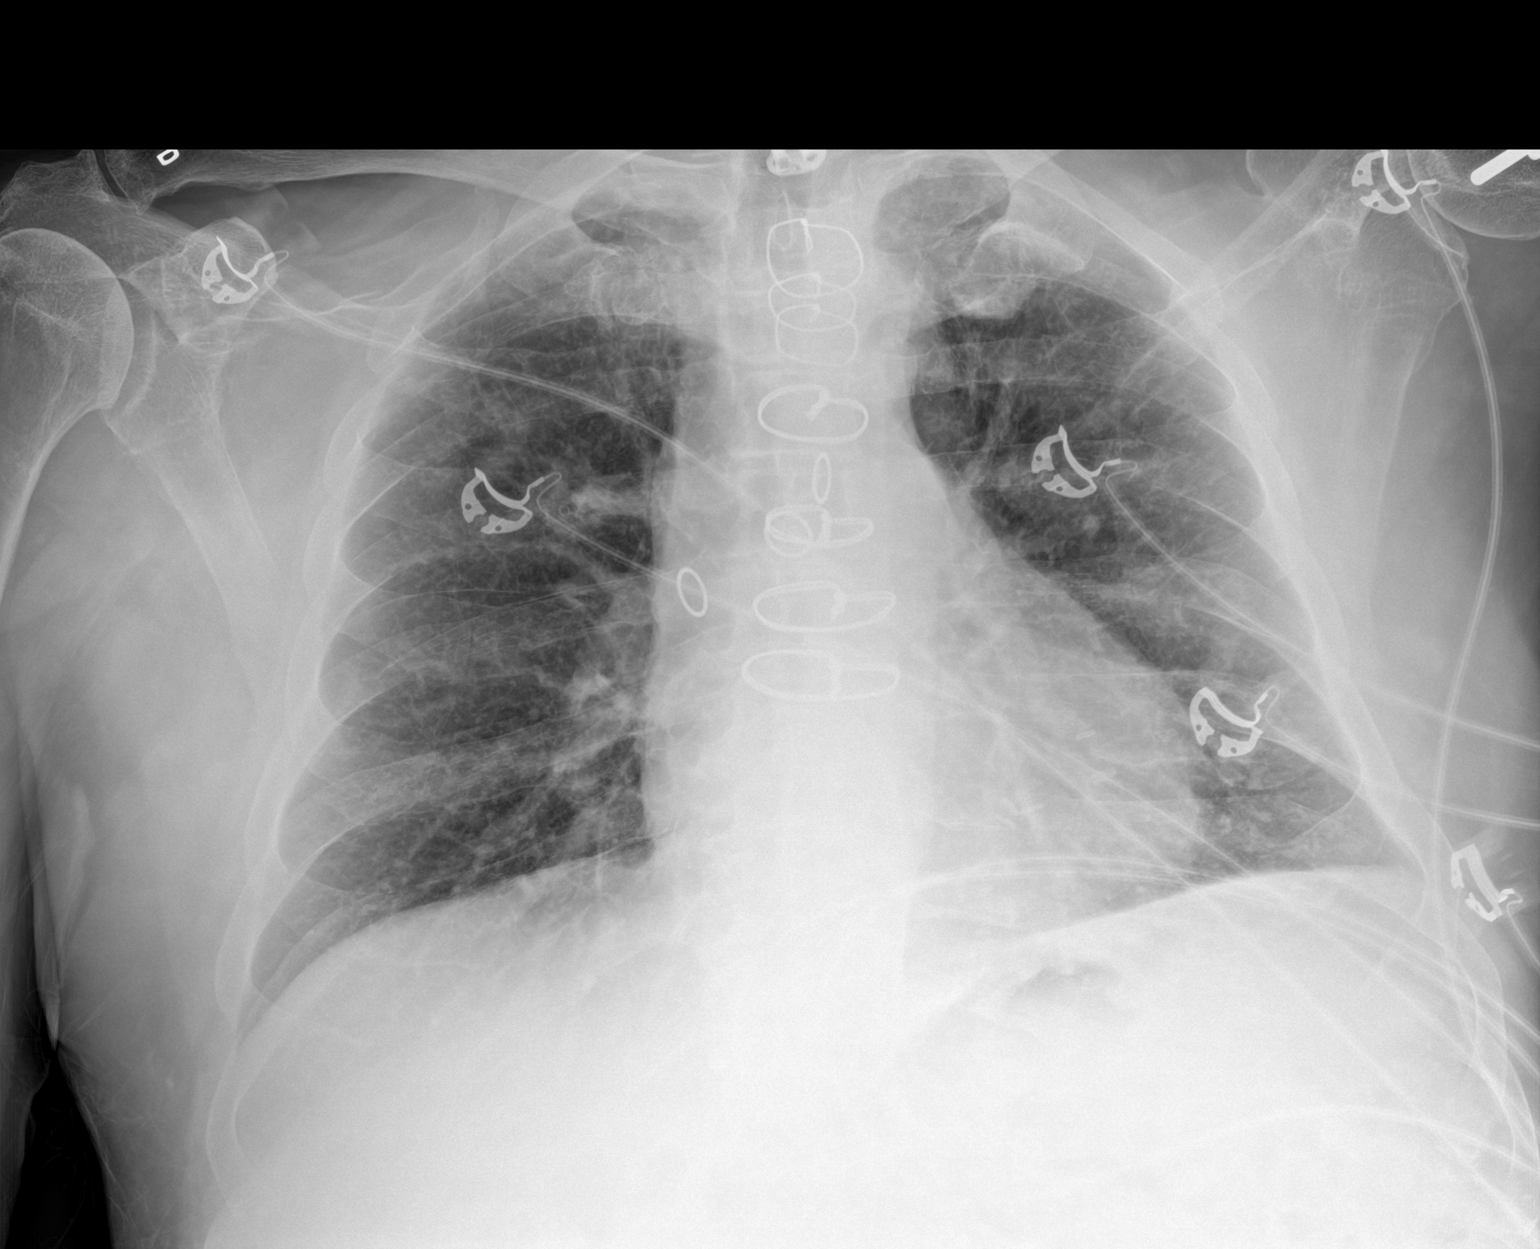

[1 of 1 positions shown; findings below may reference images not displayed]

FINDINGS: Normal heart size and mediastinal contours. CABG. There is no edema,
consolidation, effusion, or pneumothorax. Artifact from EKG leads.
IMPRESSION: No evidence of acute disease.

## 2019-12-12 IMAGING — CT CT ANGIO CHEST-ABD-PELV FOR DISSECTION W/ AND WO/W CM
2 of 7 series · 13 of 46 positions shown, 15 images · non-contrast
Comparison: Chest CT [DATE].

CLINICAL DATA: Chest and abdominal pain.

EXAM:
CT ANGIOGRAPHY CHEST, ABDOMEN AND PELVIS
TECHNIQUE: Non-contrast CT of the chest was initially obtained.

[Series 6: axial arterial · axial · arterial · 0.78mm/px · z∈[-743,-131]mm · 10 of 236 slices shown, 12 images]
[im 16/236  soft-tissue]
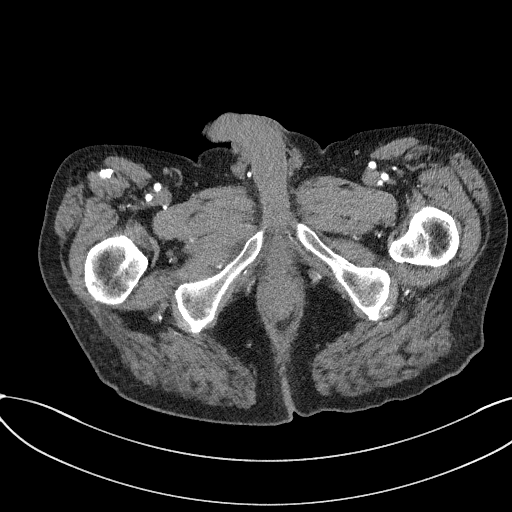
[im 16/236  bone]
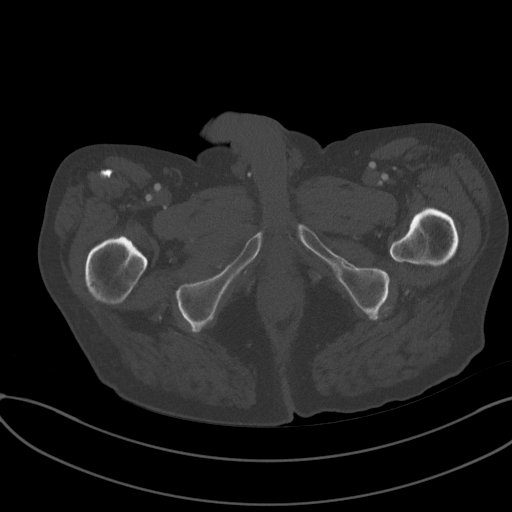
[im 48/236  soft-tissue]
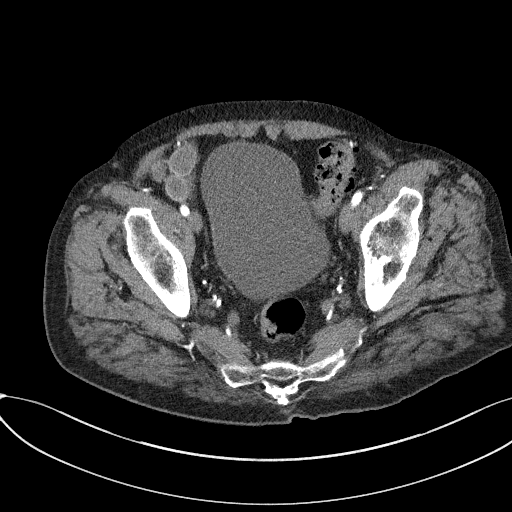
[im 63/236  soft-tissue]
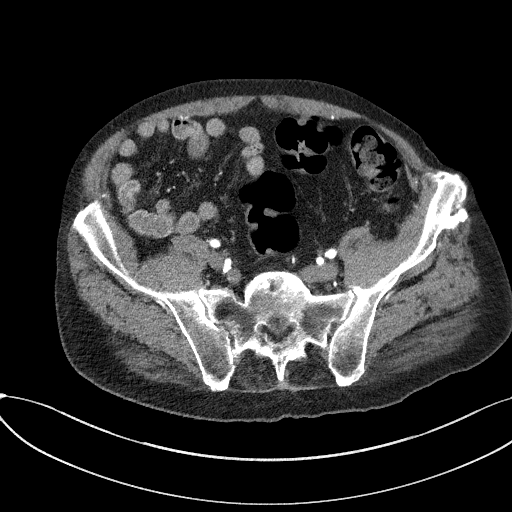
[im 79/236  soft-tissue]
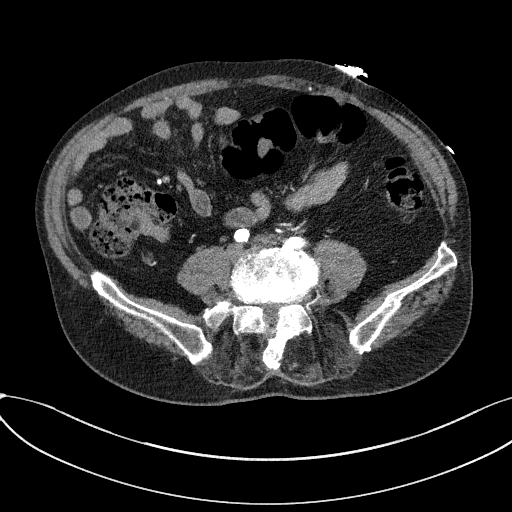
[im 110/236  soft-tissue]
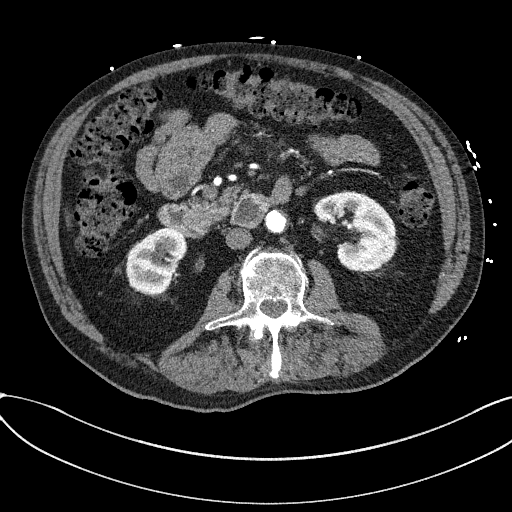
[im 126/236  soft-tissue]
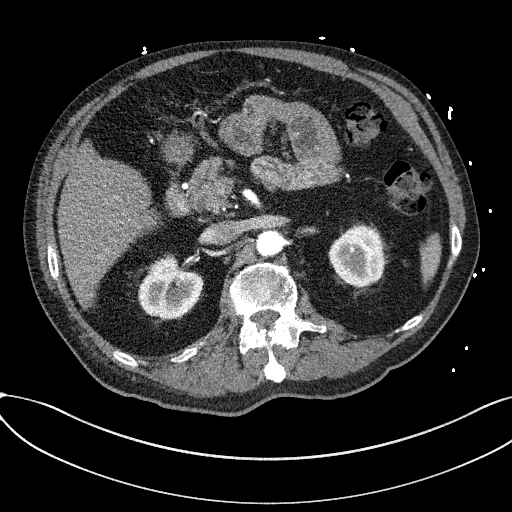
[im 157/236  soft-tissue]
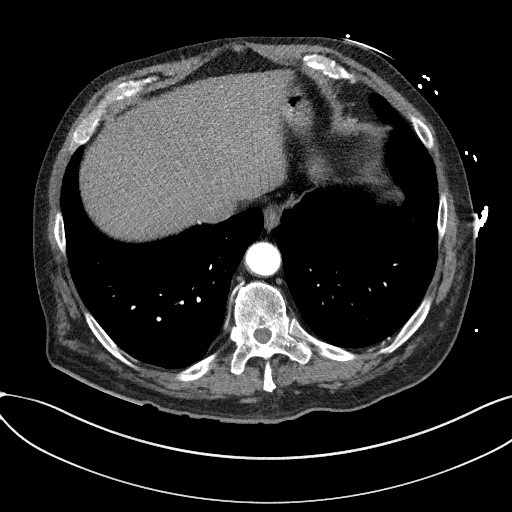
[im 173/236  soft-tissue]
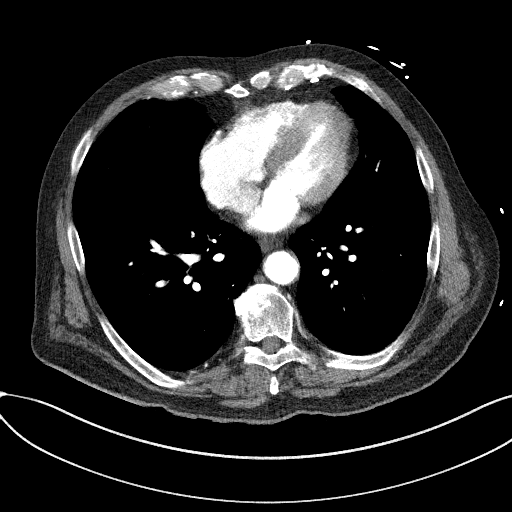
[im 189/236  soft-tissue]
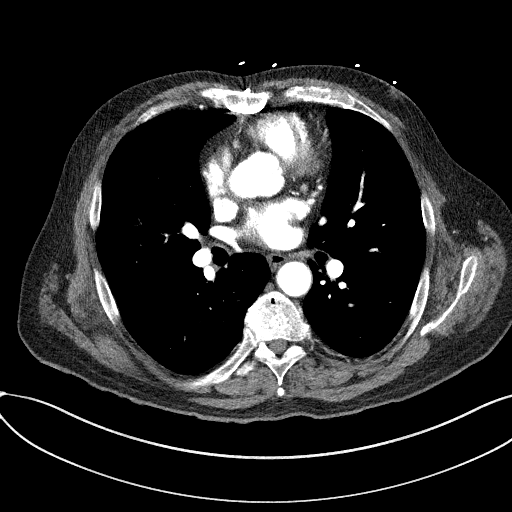
[im 189/236  bone]
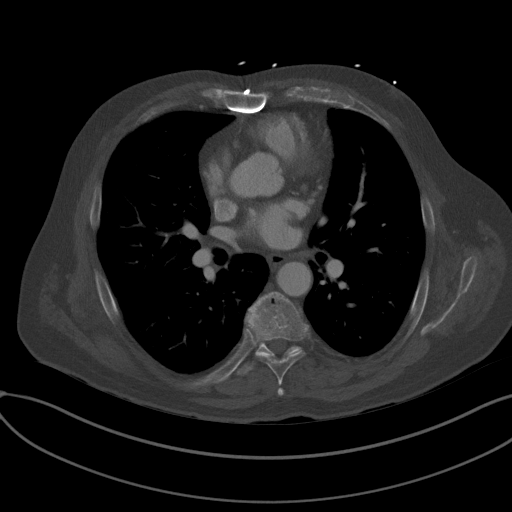
[im 220/236  soft-tissue]
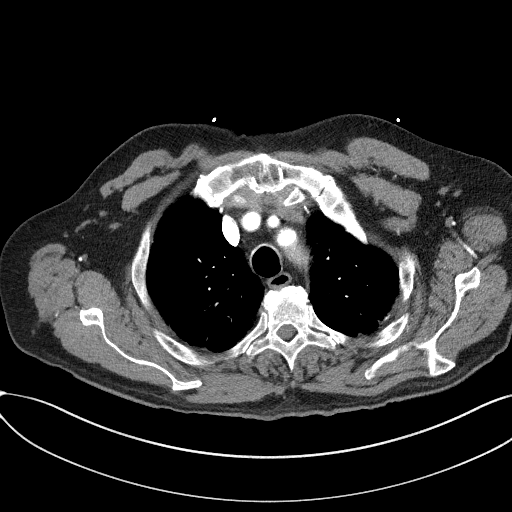

[Series 9: coronals · coronal · 0.77mm/px · 3 of 155 slices shown]
[im 39/155  soft-tissue]
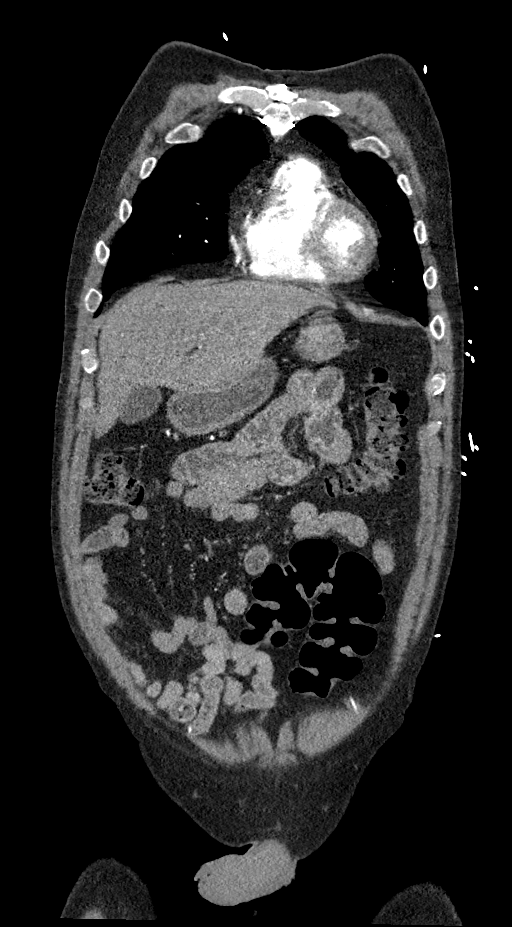
[im 78/155  soft-tissue]
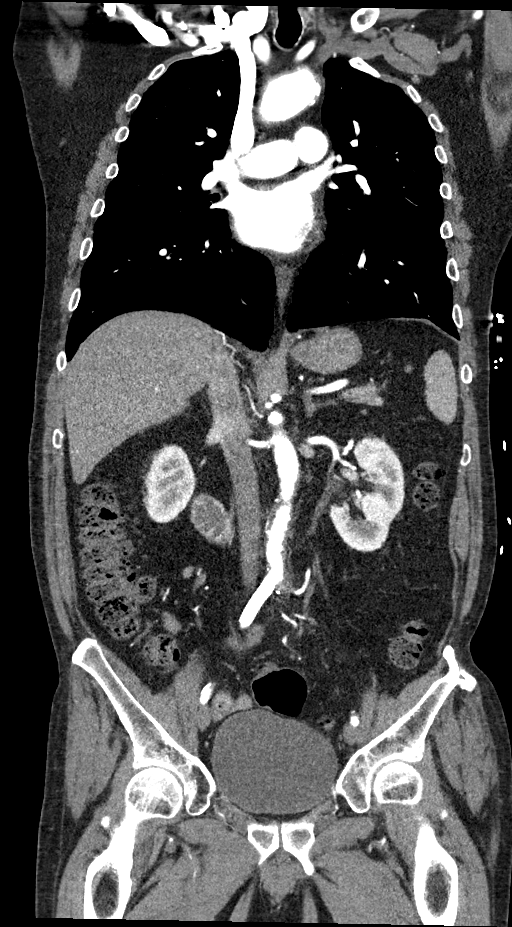
[im 116/155  soft-tissue]
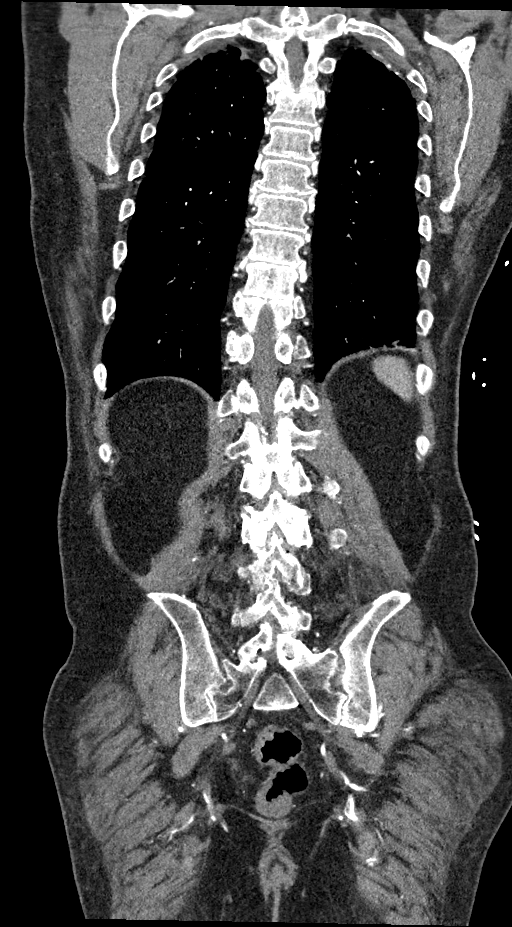

[13 of 46 positions shown; findings below may reference images not displayed]

Multidetector CT imaging through the chest, abdomen and pelvis was
performed using the standard protocol during bolus administration of
intravenous contrast. Multiplanar reconstructed images and MIPs were
obtained and reviewed to evaluate the vascular anatomy.

CONTRAST:  100mL OMNIPAQUE IOHEXOL 350 MG/ML SOLN
FINDINGS: CTA CHEST FINDINGS

Cardiovascular: The heart is normal in size. No pericardial
effusion. Mild tortuosity, ectasia and atherosclerotic
calcifications involving the thoracic aorta are. No dissection or
focal aneurysm.

Three-vessel coronary artery calcifications are noted. There are
surgical changes from coronary artery bypass surgery. The aortic
branch vessels are patent. The pulmonary arteries appear normal. No
filling defects to suggest pulmonary embolism.

Mediastinum/Nodes: No mediastinal or hilar mass or adenopathy. The
esophagus is grossly normal.

Lungs/Pleura: Biapical pleural and parenchymal scarring changes. No
worrisome pulmonary lesions or acute pulmonary findings. A few tiny
scattered subpleural pulmonary nodules are stable. No infiltrates or
effusions.

Musculoskeletal: No significant bony findings.

Review of the MIP images confirms the above findings.

CTA ABDOMEN AND PELVIS FINDINGS

VASCULAR

Aorta: Normal caliber. Moderate atherosclerotic calcifications. No
dissection.

Celiac: Ostial calcifications but no significant stenosis. The right
hepatic artery comes directly off the aorta adjacent to the celiac
axis.

SMA: Minimal ostial calcifications. No stenosis or dissection.

Renals: Mild atherosclerotic calcifications. No significant
stenosis.

IMA: Patent.

Inflow: Advanced atherosclerotic calcifications involving both
common iliac arteries. A right common iliac artery stent is noted.

Veins: Grossly normal.

Review of the MIP images confirms the above findings.

NON-VASCULAR

Hepatobiliary: No hepatic lesions are identified. The gallbladder is
grossly normal. No common bile duct dilatation.

Pancreas: No mass, inflammation or ductal dilatation.

Spleen: Normal size. No focal lesions.

Adrenals/Urinary Tract: The adrenal glands and kidneys are
unremarkable except for a 11 mm right renal calculus. The bladder is
unremarkable.

Stomach/Bowel: The stomach, duodenum, small bowel and colon are
grossly normal without oral contrast. No acute inflammatory changes,
mass lesions or obstructive findings. The terminal ileum is normal.
The appendix is normal.

Lymphatic: No abdominal or pelvic lymphadenopathy.

Reproductive: The prostate gland and seminal vesicles are
unremarkable.

Other: No pelvic mass or adenopathy. No free pelvic fluid
collections. No inguinal mass or adenopathy. No abdominal wall
hernia or subcutaneous lesions.

Musculoskeletal: Evidence of prior lumbar spine surgery. Surgical
changes involving the left iliac bone likely from bone graft harvest
procedure. No acute bony findings.

Review of the MIP images confirms the above findings.
IMPRESSION: 1. No CT findings for pulmonary embolism.
2. Mild tortuosity, ectasia and atherosclerotic calcifications
involving the thoracic aorta but no aneurysm or dissection.
3. Three-vessel coronary artery calcifications and surgical changes
from coronary artery bypass surgery.
4. Abdominal aortic and iliac artery atherosclerosis as detailed
above. The major branch vessels are patent. Right common iliac
artery stent noted.
5. No acute pulmonary findings.
6. No acute abdominal/pelvic findings, mass lesions or
lymphadenopathy.
7. 11 mm right renal calculus.

Aortic Atherosclerosis ([8O]-[8O]).

## 2019-12-12 MED ORDER — INSULIN ASPART 100 UNIT/ML ~~LOC~~ SOLN
0.0000 [IU] | Freq: Three times a day (TID) | SUBCUTANEOUS | Status: DC
  Administered 2019-12-12: 5 [IU] via SUBCUTANEOUS
  Administered 2019-12-13: 3 [IU] via SUBCUTANEOUS
  Administered 2019-12-13: 5 [IU] via SUBCUTANEOUS
  Filled 2019-12-12 (×3): qty 1

## 2019-12-12 MED ORDER — IOHEXOL 350 MG/ML SOLN
100.0000 mL | Freq: Once | INTRAVENOUS | Status: AC | PRN
  Administered 2019-12-12: 100 mL via INTRAVENOUS

## 2019-12-12 MED ORDER — NITROGLYCERIN 0.4 MG SL SUBL
0.4000 mg | SUBLINGUAL_TABLET | SUBLINGUAL | Status: DC | PRN
  Administered 2019-12-12 (×3): 0.4 mg via SUBLINGUAL
  Filled 2019-12-12: qty 1

## 2019-12-12 MED ORDER — AMLODIPINE BESYLATE 10 MG PO TABS
10.0000 mg | ORAL_TABLET | Freq: Every day | ORAL | Status: DC
  Administered 2019-12-12 – 2019-12-13 (×2): 10 mg via ORAL
  Filled 2019-12-12: qty 1
  Filled 2019-12-12: qty 2

## 2019-12-12 MED ORDER — ONDANSETRON HCL 4 MG/2ML IJ SOLN: 4.0000 mg | Freq: Four times a day (QID) | INTRAMUSCULAR | Status: DC | PRN

## 2019-12-12 MED ORDER — SPIRONOLACTONE 25 MG PO TABS
25.0000 mg | ORAL_TABLET | Freq: Every day | ORAL | Status: DC
  Administered 2019-12-12 – 2019-12-13 (×2): 25 mg via ORAL
  Filled 2019-12-12 (×3): qty 1

## 2019-12-12 MED ORDER — MORPHINE SULFATE ER 15 MG PO TBCR
15.0000 mg | EXTENDED_RELEASE_TABLET | Freq: Two times a day (BID) | ORAL | Status: DC | PRN
  Administered 2019-12-12 – 2019-12-13 (×2): 15 mg via ORAL
  Filled 2019-12-12 (×4): qty 1

## 2019-12-12 MED ORDER — HYDRALAZINE HCL 25 MG PO TABS
25.0000 mg | ORAL_TABLET | Freq: Three times a day (TID) | ORAL | Status: DC
  Administered 2019-12-12 – 2019-12-13 (×4): 25 mg via ORAL
  Filled 2019-12-12 (×4): qty 1

## 2019-12-12 MED ORDER — EZETIMIBE 10 MG PO TABS
10.0000 mg | ORAL_TABLET | Freq: Every day | ORAL | Status: DC
  Administered 2019-12-13: 10 mg via ORAL
  Filled 2019-12-12 (×2): qty 1

## 2019-12-12 MED ORDER — REGADENOSON 0.4 MG/5ML IV SOLN
0.4000 mg | Freq: Once | INTRAVENOUS | Status: AC
  Administered 2019-12-12: 0.4 mg via INTRAVENOUS

## 2019-12-12 MED ORDER — FLUTICASONE PROPIONATE 50 MCG/ACT NA SUSP
1.0000 | Freq: Two times a day (BID) | NASAL | Status: DC
  Filled 2019-12-12: qty 16

## 2019-12-12 MED ORDER — ONDANSETRON HCL 4 MG/2ML IJ SOLN
4.0000 mg | Freq: Once | INTRAMUSCULAR | Status: AC
  Administered 2019-12-12: 4 mg via INTRAVENOUS
  Filled 2019-12-12: qty 2

## 2019-12-12 MED ORDER — POTASSIUM CHLORIDE CRYS ER 20 MEQ PO TBCR
40.0000 meq | EXTENDED_RELEASE_TABLET | Freq: Once | ORAL | Status: AC
  Administered 2019-12-12: 40 meq via ORAL
  Filled 2019-12-12: qty 2

## 2019-12-12 MED ORDER — TECHNETIUM TC 99M TETROFOSMIN IV KIT
10.0000 | PACK | Freq: Once | INTRAVENOUS | Status: AC | PRN
  Administered 2019-12-12: 10 via INTRAVENOUS
  Filled 2019-12-12: qty 10

## 2019-12-12 MED ORDER — ATORVASTATIN CALCIUM 80 MG PO TABS
80.0000 mg | ORAL_TABLET | Freq: Every day | ORAL | Status: DC
  Administered 2019-12-13: 80 mg via ORAL
  Filled 2019-12-12: qty 1

## 2019-12-12 MED ORDER — ALUM & MAG HYDROXIDE-SIMETH 200-200-20 MG/5ML PO SUSP
30.0000 mL | Freq: Once | ORAL | Status: AC
  Administered 2019-12-12: 30 mL via ORAL
  Filled 2019-12-12: qty 30

## 2019-12-12 MED ORDER — FLUTICASONE PROPIONATE 50 MCG/ACT NA SUSP: 1.0000 | Freq: Two times a day (BID) | NASAL | Status: DC

## 2019-12-12 MED ORDER — CARVEDILOL 25 MG PO TABS
25.0000 mg | ORAL_TABLET | Freq: Two times a day (BID) | ORAL | Status: DC
  Administered 2019-12-12 – 2019-12-13 (×2): 25 mg via ORAL
  Filled 2019-12-12 (×2): qty 1

## 2019-12-12 MED ORDER — ACETAMINOPHEN 325 MG PO TABS
650.0000 mg | ORAL_TABLET | ORAL | Status: DC | PRN
  Administered 2019-12-12 – 2019-12-13 (×4): 650 mg via ORAL
  Filled 2019-12-12 (×4): qty 2

## 2019-12-12 MED ORDER — LIDOCAINE VISCOUS HCL 2 % MT SOLN
15.0000 mL | Freq: Once | OROMUCOSAL | Status: AC
  Administered 2019-12-12: 15 mL via ORAL
  Filled 2019-12-12: qty 15

## 2019-12-12 MED ORDER — MORPHINE SULFATE (PF) 4 MG/ML IV SOLN
6.0000 mg | Freq: Once | INTRAVENOUS | Status: AC
  Administered 2019-12-12: 6 mg via INTRAVENOUS
  Filled 2019-12-12: qty 2

## 2019-12-12 MED ORDER — NITROGLYCERIN 2 % TD OINT
0.5000 [in_us] | TOPICAL_OINTMENT | Freq: Once | TRANSDERMAL | Status: AC
  Administered 2019-12-12: 0.5 [in_us] via TOPICAL
  Filled 2019-12-12: qty 1

## 2019-12-12 MED ORDER — RIVAROXABAN 20 MG PO TABS
20.0000 mg | ORAL_TABLET | Freq: Every day | ORAL | Status: DC
  Filled 2019-12-12: qty 1

## 2019-12-12 MED ORDER — TECHNETIUM TC 99M TETROFOSMIN IV KIT
29.8100 | PACK | Freq: Once | INTRAVENOUS | Status: AC | PRN
  Administered 2019-12-12: 29.81 via INTRAVENOUS
  Filled 2019-12-12: qty 30

## 2019-12-12 NOTE — ED Notes (Signed)
This RN to bedside, repeat troponin collected by this RN. Pt states pain 8/10 at this time. Pt A&O x4. Call bell within reach at this time. Pt denies further needs at this time.

## 2019-12-12 NOTE — H&P (Signed)
History and Physical    Bryan Scott FVC:[id number] DOB: [birth year] DOA: 12/12/2019  PCP: [name], MD   Patient coming from: Home  I have personally briefly reviewed patient's old medical records in [place]  Chief Complaint: Chest pain  HPI: Bryan Scott is a 74 y.o. male with medical history significant for paroxysmal atrial fibrillation on anticoagulation therapy, history of diabetes mellitus, coronary artery disease status post CABG, status post stent angioplasty who presents to the emergency room for evaluation of chest pain.  He describes the chest pain as a sharp stabbing sensation that radiates to his back and both arms with an occasional tingling sensation in the left arm.  He rates his pain an 10 x 10 in intensity at its worst with no relieving or aggravating factors.  Chest pain is not associated with shortness of breath but he denies having any nausea, vomiting, diaphoresis or palpitations. He had mild relief of his pain with nitroglycerin patch. Vital signs reveal elevated blood pressure with systolic blood pressure of about 121mmHg upon arrival to the ER. Labs reveal negative troponin and mild hypokalemia 3.4 Patient had a CT angiogram of the chest which did not show any acute findings Twelve-lead EKG showed sinus rhythm with T wave inversions in the lateral leads   ED Course: Patient is a 74 year old male with a history of coronary artery disease status post CABG, history of paroxysmal A. fib on anticoagulation and diabetes mellitus who presents for evaluation of chest pain.  He has 2 - troponins and CT angiogram was negative for PE.  Patient continues to have ongoing chest pain and will be referred to observation status for further evaluation.  Review of Systems: As per HPI otherwise 10 point review of systems negative.    Past Medical History:  Diagnosis Date  . 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 Cancer ([place])   . Carotid artery disease (HCC)    >  75% bilateral ICA stenoses on 01/2013 CT  . Chronic pain syndrome   . Coronary artery disease    s/p CABG ~ 2007  . GERD (gastroesophageal reflux disease)   . Heparin allergy    Bleeding  . History of tobacco use   . Hyperlipidemia   . Hypertension   . Ischemic cardiomyopathy   . Paroxysmal atrial fibrillation (HCC)    On Xarelto anticoagulation  . Type 2 diabetes mellitus (HCC)    A1C 6.8% in 01/2013    Past Surgical History:  Procedure Laterality Date  . CARDIAC CATHETERIZATION    . CORONARY ARTERY BYPASS GRAFT  2007     reports that he has quit smoking. His smoking use included cigarettes. He has a 30.00 pack-year smoking history. He has never used smokeless tobacco. He reports that he does not drink alcohol and does not use drugs.  Allergies  Allergen Reactions  . 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 Heparin Other (See Comments)    Reaction:  Bleeding   . Lisinopril Swelling    Family History  Problem Relation Age of Onset  . Heart disease Mother 34       Deceased  . CVA Mother   . Seizures Father 32       Deceased from complications with seizures     Prior to Admission medications   Medication Sig Start Date End Date Taking? Authorizing Provider  amLODipine (NORVASC) 5 MG tablet Take 1 tablet (5 mg total) by mouth daily. Patient taking differently: Take 10  mg by mouth daily.  03/16/15  Yes Gouru, Illene Silver, MD  atorvastatin (LIPITOR) 80 MG tablet Take 80 mg by mouth daily.   Yes [provider]  carvedilol (COREG) 12.5 MG tablet Take 1 tablet (12.5 mg total) by mouth every 12 (twelve) hours. Patient taking differently: Take 25 mg by mouth 2 (two) times daily with a meal.  09/10/19  Yes Fritzi Mandes, MD  empagliflozin (JARDIANCE) 25 MG TABS tablet Take 25 mg by mouth daily.   Yes [provider]  ezetimibe (ZETIA) 10 MG tablet Take 1 tablet (10 mg total) by mouth daily. 09/11/19  Yes Fritzi Mandes, MD  glipiZIDE (GLUCOTROL) 10 MG tablet Take 10 mg by mouth 2  (two) times daily before a meal.   Yes [provider]  hydrALAZINE (APRESOLINE) 25 MG tablet Take 25 mg by mouth 3 (three) times daily.   Yes [provider]  metFORMIN (GLUMETZA) 500 MG (MOD) 24 hr tablet Take 500 mg by mouth 2 (two) times daily with a meal.   Yes [provider]  spironolactone (ALDACTONE) 25 MG tablet Take 1 tablet (25 mg total) by mouth daily. 09/11/19  Yes Fritzi Mandes, MD  fluticasone (FLONASE) 50 MCG/ACT nasal spray Place 1 spray into both nostrils 2 (two) times daily. Stop after 10 days Patient not taking: Reported on 12/12/2019 07/16/19   Thornell Mule, MD  guaiFENesin-dextromethorphan [facility] DM) 100-10 MG/5ML syrup Take 5 mLs by mouth every 4 (four) hours as needed for cough. 07/16/19   Thornell Mule, MD  morphine (MS CONTIN) 15 MG 12 hr tablet Take 15 mg by mouth every 12 (twelve) hours as needed for pain.    [provider]  ondansetron (ZOFRAN) 8 MG tablet Take 8 mg by mouth every 8 (eight) hours as needed for nausea or vomiting.     [provider]  rivaroxaban (XARELTO) 20 MG TABS tablet Take 1 tablet (20 mg total) by mouth daily with supper. Patient not taking: Reported on 12/12/2019 09/22/16   Dustin Flock, MD    Physical Exam: Vitals:   12/12/19 0631 12/12/19 0700 12/12/19 0730 12/12/19 0930  BP: (!) 148/78 137/69 (!) 153/80   Pulse: 61 62 (!) 55 65  Resp: 18 16 16 18   Temp:      TempSrc:      SpO2: 97% 96% 99% 99%  Weight:      Height:         Vitals:   12/12/19 0631 12/12/19 0700 12/12/19 0730 12/12/19 0930  BP: (!) 148/78 137/69 (!) 153/80   Pulse: 61 62 (!) 55 65  Resp: 18 16 16 18   Temp:      TempSrc:      SpO2: 97% 96% 99% 99%  Weight:      Height:        Constitutional: NAD, alert and oriented x 3 Eyes: PERRL, lids and conjunctivae normal ENMT: Mucous membranes are moist.  Neck: normal, supple, no masses, no thyromegaly Respiratory: clear to auscultation bilaterally, no wheezing, no  crackles. Normal respiratory effort. No accessory muscle use.  Cardiovascular: Regular rate and rhythm, no murmurs / rubs / gallops. No extremity edema. 2+ pedal pulses. No carotid bruits.  Abdomen: no tenderness, no masses palpated. No hepatosplenomegaly. Bowel sounds positive.  Musculoskeletal: no clubbing / cyanosis. No joint deformity upper and lower extremities.  Skin: no rashes, lesions, ulcers.  Neurologic: No gross focal neurologic deficit. Psychiatric: Normal mood and affect.   Labs on Admission: I have personally reviewed  following labs and imaging studies  CBC: Recent Labs  Lab 12/12/19 0518  WBC 10.8*  HGB 16.8  HCT 48.2  MCV 93.2  PLT 250   Basic Metabolic Panel: Recent Labs  Lab 12/12/19 0518  NA 136  K 3.4*  CL 103  CO2 21*  GLUCOSE 226*  BUN 16  CREATININE 0.98  CALCIUM 9.5   GFR: Estimated Creatinine Clearance: 72.6 mL/min (by C-G formula based on SCr of 0.98 mg/dL). Liver Function Tests: Recent Labs  Lab 12/12/19 0518  AST 22  ALT 27  ALKPHOS 86  BILITOT 1.0  PROT 7.9  ALBUMIN 4.0   Recent Labs  Lab 12/12/19 0518  LIPASE 50   No results for input(s): AMMONIA in the last 168 hours. Coagulation Profile: No results for input(s): INR, PROTIME in the last 168 hours. Cardiac Enzymes: No results for input(s): CKTOTAL, CKMB, CKMBINDEX, TROPONINI in the last 168 hours. BNP (last 3 results) No results for input(s): PROBNP in the last 8760 hours. HbA1C: No results for input(s): HGBA1C in the last 72 hours. CBG: No results for input(s): GLUCAP in the last 168 hours. Lipid Profile: No results for input(s): CHOL, HDL, LDLCALC, TRIG, CHOLHDL, LDLDIRECT in the last 72 hours. Thyroid Function Tests: No results for input(s): TSH, T4TOTAL, FREET4, T3FREE, THYROIDAB in the last 72 hours. Anemia Panel: No results for input(s): VITAMINB12, FOLATE, FERRITIN, TIBC, IRON, RETICCTPCT in the last 72 hours. Urine analysis:    Component Value Date/Time    COLORURINE YELLOW (A) 09/09/2019 0946   APPEARANCEUR CLEAR (A) 09/09/2019 0946   APPEARANCEUR Clear 10/15/2013 2200   LABSPEC 1.023 09/09/2019 0946   LABSPEC 1.012 10/15/2013 2200   PHURINE 6.0 09/09/2019 0946   GLUCOSEU >=500 (A) 09/09/2019 0946   GLUCOSEU Negative 10/15/2013 2200   HGBUR NEGATIVE 09/09/2019 0946   BILIRUBINUR NEGATIVE 09/09/2019 0946   BILIRUBINUR Negative 10/15/2013 2200   KETONESUR 5 (A) 09/09/2019 0946   PROTEINUR NEGATIVE 09/09/2019 0946   NITRITE NEGATIVE 09/09/2019 0946   LEUKOCYTESUR NEGATIVE 09/09/2019 0946   LEUKOCYTESUR Negative 10/15/2013 2200    Radiological Exams on Admission: DG Chest Portable 1 View  Result Date: 12/12/2019 CLINICAL DATA:  Chest pain EXAM: PORTABLE CHEST 1 VIEW COMPARISON:  11/09/2019 FINDINGS: Normal heart size and mediastinal contours. CABG. There is no edema, consolidation, effusion, or pneumothorax. Artifact from EKG leads. IMPRESSION: No evidence of acute disease. Electronically Signed   By: Monte Fantasia M.D.   On: 12/12/2019 05:19   CT Angio Chest/Abd/Pel for Dissection W and/or Wo Contrast  Result Date: 12/12/2019 CLINICAL DATA:  Chest and abdominal pain. EXAM: CT ANGIOGRAPHY CHEST, ABDOMEN AND PELVIS TECHNIQUE: Non-contrast CT of the chest was initially obtained. Multidetector CT imaging through the chest, abdomen and pelvis was performed using the standard protocol during bolus administration of intravenous contrast. Multiplanar reconstructed images and MIPs were obtained and reviewed to evaluate the vascular anatomy. CONTRAST:  18mL OMNIPAQUE IOHEXOL 350 MG/ML SOLN COMPARISON:  Chest CT 07/16/2019. FINDINGS: CTA CHEST FINDINGS Cardiovascular: The heart is normal in size. No pericardial effusion. Mild tortuosity, ectasia and atherosclerotic calcifications involving the thoracic aorta are. No dissection or focal aneurysm. Three-vessel coronary artery calcifications are noted. There are surgical changes from coronary artery bypass  surgery. The aortic branch vessels are patent. The pulmonary arteries appear normal. No filling defects to suggest pulmonary embolism. Mediastinum/Nodes: No mediastinal or hilar mass or adenopathy. The esophagus is grossly normal. Lungs/Pleura: Biapical pleural and parenchymal scarring changes. No worrisome pulmonary lesions or acute pulmonary  findings. A few tiny scattered subpleural pulmonary nodules are stable. No infiltrates or effusions. Musculoskeletal: No significant bony findings. Review of the MIP images confirms the above findings. CTA ABDOMEN AND PELVIS FINDINGS VASCULAR Aorta: Normal caliber. Moderate atherosclerotic calcifications. No dissection. Celiac: Ostial calcifications but no significant stenosis. The right hepatic artery comes directly off the aorta adjacent to the celiac axis. SMA: Minimal ostial calcifications. No stenosis or dissection. Renals: Mild atherosclerotic calcifications. No significant stenosis. IMA: Patent. Inflow: Advanced atherosclerotic calcifications involving both common iliac arteries. A right common iliac artery stent is noted. Veins: Grossly normal. Review of the MIP images confirms the above findings. NON-VASCULAR Hepatobiliary: No hepatic lesions are identified. The gallbladder is grossly normal. No common bile duct dilatation. Pancreas: No mass, inflammation or ductal dilatation. Spleen: Normal size. No focal lesions. Adrenals/Urinary Tract: The adrenal glands and kidneys are unremarkable except for a 11 mm right renal calculus. The bladder is unremarkable. Stomach/Bowel: The stomach, duodenum, small bowel and colon are grossly normal without oral contrast. No acute inflammatory changes, mass lesions or obstructive findings. The terminal ileum is normal. The appendix is normal. Lymphatic: No abdominal or pelvic lymphadenopathy. Reproductive: The prostate gland and seminal vesicles are unremarkable. Other: No pelvic mass or adenopathy. No free pelvic fluid collections. No  inguinal mass or adenopathy. No abdominal wall hernia or subcutaneous lesions. Musculoskeletal: Evidence of prior lumbar spine surgery. Surgical changes involving the left iliac bone likely from bone graft harvest procedure. No acute bony findings. Review of the MIP images confirms the above findings. IMPRESSION: 1. No CT findings for pulmonary embolism. 2. Mild tortuosity, ectasia and atherosclerotic calcifications involving the thoracic aorta but no aneurysm or dissection. 3. Three-vessel coronary artery calcifications and surgical changes from coronary artery bypass surgery. 4. Abdominal aortic and iliac artery atherosclerosis as detailed above. The major branch vessels are patent. Right common iliac artery stent noted. 5. No acute pulmonary findings. 6. No acute abdominal/pelvic findings, mass lesions or lymphadenopathy. 7. 11 mm right renal calculus. Aortic Atherosclerosis (ICD10-I70.0). Electronically Signed   By: Marijo Sanes M.D.   On: 12/12/2019 07:41    EKG: Independently reviewed.  Sinus rhythm First-degree AV block T wave inversion lateral leads  Assessment/Plan Principal Problem:   Chest pain, central Active Problems:   Hypertensive urgency   Type 2 diabetes mellitus without complication (HCC)   GERD (gastroesophageal reflux disease)   Chest pain In a patient with known coronary artery disease status post CABG Cardiac enzymes are negative and EKG does not show any acute findings but patient continues to have pain Continue IV morphine as needed for pain Trial of GI cocktail Will obtain stress test for further risk factor stratification   Hypertensive urgency Optimize blood pressure control Continue carvedilol, spironolactone, amlodipine and hydralazine   Diabetes mellitus Maintain consistent carbohydrate diet Sliding scale coverage with insulin   History of paroxysmal A. Fib Continue carvedilol for rate control Continue Xarelto as primary prophylaxis for an acute  stroke    DVT prophylaxis: Xarelto Code Status: Full code Family Communication: Greater than 50% of time was spent discussing plan of care patient.  All questions and concerns have been addressed.  He verbalizes understanding and agrees with the plan Disposition Plan: Back to previous home environment Consults called: None    Katherene Dinino MD [facility]     12/12/2019, 10:15 AM

## 2019-12-12 NOTE — ED Notes (Signed)
Pt transported to Nuclear Medicine  

## 2019-12-12 NOTE — ED Triage Notes (Signed)
Patient coming [facility] from home for chest pain described as pressure, rated at 10/10. Patient has hx of dm and htn.

## 2019-12-12 NOTE — Plan of Care (Signed)
  Problem: Education: Goal: Knowledge of General Education information will improve Description Including pain rating scale, medication(s)/side effects and non-pharmacologic comfort measures Outcome: Progressing   

## 2019-12-12 NOTE — ED Notes (Signed)
Pt returned from CT at this time.  

## 2019-12-12 NOTE — ED Provider Notes (Signed)
Patient received in signout from Dr. Nickolas Madrid.  Patient at high risk chest pain.  Troponin negative and CTA negative but still having 8 out of 10 pain.  Did get some improvement with nitroglycerin therefore suspect hypertensive urgency.  Will place some Nitropaste.  Will discuss with hospitalist for admission.   Merlyn Lot, MD 12/12/19 [phone]

## 2019-12-12 NOTE — ED Notes (Signed)
Attempted to call report

## 2019-12-12 NOTE — ED Provider Notes (Signed)
[facility] Emergency Department Provider Note  ____________________________________________   First MD Initiated Contact with Patient 12/12/19 [phone]     (approximate)  I have reviewed the triage vital signs and the nursing notes.   HISTORY  Chief Complaint Chest Pain    HPI Bryan Scott is a 74 y.o. male with paroxysmal A. fib on Xarelto, diabetes, prior CABG who comes in with chest pain.  Patient states that he is having severe pain in his chest that is sharp stabbing sensation goes into his back and down his bilateral arms.  He occasionally had some tingling sensation in his left arm with it.  The pain is constant, nothing makes it better, nothing makes it worse.  Stated started a few hours ago.  Report a little bit of shortness of breath that seems to be better now.  No leg swelling, reports a little bit of abdominal pain but now resolved.  Patient states he has been compliant with his blood thinner.           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    Angioedema   Cancer (HCC)    Carotid artery disease ([place])    > 75% bilateral ICA stenoses on 01/2013 CT   Chronic pain syndrome    Coronary artery disease    s/p CABG ~ 2007   GERD (gastroesophageal reflux disease)    Heparin allergy    Bleeding   History of tobacco use    Hyperlipidemia    Hypertension    Ischemic cardiomyopathy    Paroxysmal atrial fibrillation (HCC)    On Xarelto anticoagulation   Type 2 diabetes mellitus ([place])    A1C 6.8% in 01/2013    Patient Active Problem List   Diagnosis Date Noted   Acute metabolic encephalopathy 11/91/4782   GERD (gastroesophageal reflux disease) 09/09/2019   CAD (coronary artery disease) 09/09/2019   Chronic pain syndrome 09/09/2019   Leukocytosis 09/09/2019   Shortness of breath    Hyponatremia 09/26/2016   Chest pain 09/22/2016   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    Abnormal EKG 09/21/2016   Chronic pain 09/21/2016   PAF (paroxysmal atrial fibrillation) ([place]) 09/21/2016   Essential hypertension 05/02/2016   Carotid stenosis 05/02/2016   CAD in native artery    Atypical chest pain    Uncontrolled type 2 diabetes mellitus with complication, with long-term current use of insulin ([place])    Hyperlipidemia    Pain of left upper extremity    Angina pectoris ([place])    Abdominal pain    Chest pain, central 03/13/2015   Hypertensive urgency 03/13/2015    Past Surgical History:  Procedure Laterality Date   CARDIAC CATHETERIZATION     CORONARY ARTERY BYPASS GRAFT  2007    Prior to Admission medications   Medication Sig Start Date End Date Taking? Authorizing Provider  acetaminophen (TYLENOL) 325 MG tablet Take 975 mg by mouth 3 (three) times daily as needed for mild pain or fever.    [provider]  amLODipine (NORVASC) 5 MG tablet Take 1 tablet (5 mg total) by mouth daily. Patient taking differently: Take 10 mg by mouth daily.  03/16/15   Gouru, Illene Silver, MD  atorvastatin (LIPITOR) 80 MG tablet Take 80 mg by mouth daily.    [provider]  carvedilol (COREG) 12.5 MG tablet Take 1 tablet (12.5 mg total) by mouth every 12 (twelve) hours. 09/10/19   Fritzi Mandes,  MD  empagliflozin (JARDIANCE) 25 MG TABS tablet Take 25 mg by mouth daily.    [provider]  ezetimibe (ZETIA) 10 MG tablet Take 1 tablet (10 mg total) by mouth daily. 09/11/19   Fritzi Mandes, MD  fluticasone (FLONASE) 50 MCG/ACT nasal spray Place 1 spray into both nostrils 2 (two) times daily. Stop after 10 days 07/16/19   Thornell Mule, MD  glipiZIDE (GLUCOTROL) 10 MG tablet Take 10 mg by mouth 2 (two) times daily before a meal.    [provider]  guaiFENesin-dextromethorphan (ROBITUSSIN DM) 100-10 MG/5ML syrup Take 5 mLs by mouth every 4 (four) hours as needed for cough. 07/16/19   Thornell Mule, MD  hydrALAZINE (APRESOLINE) 25 MG tablet Take 25 mg by  mouth 3 (three) times daily.    [provider]  insulin glargine (LANTUS) 100 UNIT/ML injection Inject into the skin. 10 units sq daily at bedtime    [provider]  morphine (MS CONTIN) 15 MG 12 hr tablet Take 15 mg by mouth every 12 (twelve) hours as needed for pain.    [provider]  ondansetron (ZOFRAN) 8 MG tablet Take 8 mg by mouth every 8 (eight) hours as needed for nausea or vomiting.     [provider]  rivaroxaban (XARELTO) 20 MG TABS tablet Take 1 tablet (20 mg total) by mouth daily with supper. 09/22/16   Dustin Flock, MD  spironolactone (ALDACTONE) 25 MG tablet Take 1 tablet (25 mg total) by mouth daily. 09/11/19   Fritzi Mandes, MD    Allergies Aspergum [aspirin], Heparin, and Lisinopril  Family History  Problem Relation Age of Onset   Heart disease Mother 40       Deceased   CVA Mother    Seizures Father 40       Deceased from complications with seizures    Social History Social History   Tobacco Use   Smoking status: Former Smoker    Packs/day: 1.00    Years: 30.00    Pack years: 30.00    Types: Cigarettes   Smokeless tobacco: Never Used   Tobacco comment: 30 pack-year history. Quit 8-9 years ago.   Substance Use Topics   Alcohol use: No   Drug use: No      Review of Systems Constitutional: No fever/chills Eyes: No visual changes. ENT: No sore throat. Cardiovascular: Positive chest pain Respiratory: Denies shortness of breath. Gastrointestinal: No abdominal pain at this time but some previously.  No nausea, no vomiting.  No diarrhea.  No constipation. Genitourinary: Negative for dysuria. Musculoskeletal: Negative for back pain.  Left arm pain Skin: Negative for rash. Neurological: Negative for headaches, focal weakness or numbness. All other ROS negative ____________________________________________   PHYSICAL EXAM:  VITAL SIGNS: ED Triage Vitals [12/12/19 0452]  Enc Vitals Group     BP      Pulse       Resp      Temp      Temp src      SpO2      Weight 197 lb 12 oz (89.7 kg)     Height 6' (1.829 m)     Head Circumference      Peak Flow      Pain Score 10     Pain Loc      Pain Edu?      Excl. in [place]?     Constitutional: Alert and oriented. Eyes: Conjunctivae are normal. EOMI. Head: Atraumatic. Nose: No congestion/rhinnorhea. Mouth/Throat: Mucous  membranes are moist.   Neck: No stridor. Trachea Midline. FROM Cardiovascular: Normal rate, regular rhythm. Grossly normal heart sounds.  Good peripheral circulation. Respiratory: Normal respiratory effort.  No retractions. Lungs CTAB. Gastrointestinal: Soft and nontender. No distention. No abdominal bruits.  Musculoskeletal: No lower extremity tenderness nor edema.  No joint effusions. Neurologic:  Normal speech and language. No gross focal neurologic deficits are appreciated.  Skin:  Skin is warm, dry and intact. No rash noted. Psychiatric: Mood and affect are normal. Speech and behavior are normal. GU: Deferred   ____________________________________________   LABS (all labs ordered are listed, but only abnormal results are displayed)  Labs Reviewed  BASIC METABOLIC PANEL  CBC  HEPATIC FUNCTION PANEL  LIPASE, BLOOD  BRAIN NATRIURETIC PEPTIDE  TROPONIN I (HIGH SENSITIVITY)   ____________________________________________   ED ECG REPORT I, Vanessa Laurel, the attending physician, personally viewed and interpreted this ECG.  Sinus rate of 60, no ST elevation, no T wave inversions except for may be a little flattening aVL, type I AV block ____________________________________________  RADIOLOGY Robert Bellow, personally viewed and evaluated these images (plain radiographs) as part of my medical decision making, as well as reviewing the written report by the radiologist.  ED MD interpretation: No pneumonia  Official radiology report(s): DG Chest Portable 1 View  Result Date: 12/12/2019 CLINICAL DATA:  Chest pain  EXAM: PORTABLE CHEST 1 VIEW COMPARISON:  11/09/2019 FINDINGS: Normal heart size and mediastinal contours. CABG. There is no edema, consolidation, effusion, or pneumothorax. Artifact from EKG leads. IMPRESSION: No evidence of acute disease. Electronically Signed   By: Monte Fantasia M.D.   On: 12/12/2019 05:19    ____________________________________________   PROCEDURES  Procedure(s) performed (including Critical Care):  .1-3 Lead EKG Interpretation Performed by: Vanessa [place], MD Authorized by: Vanessa [place], MD     Interpretation: normal     ECG rate:  60s   ECG rate assessment: normal     Rhythm: sinus rhythm     Ectopy: none     Conduction: abnormal     Abnormal conduction: 1st degree AV block       ____________________________________________   INITIAL IMPRESSION / ASSESSMENT AND PLAN / ED COURSE   ARMOND CUTHRELL was evaluated in Emergency Department on 12/12/2019 for the symptoms described in the history of present illness. He was evaluated in the context of the global COVID-19 pandemic, which necessitated consideration that the patient might be at risk for infection with the SARS-CoV-2 virus that causes COVID-19. Institutional protocols and algorithms that pertain to the evaluation of patients at risk for COVID-19 are in a state of rapid change based on information released by regulatory bodies including the CDC and federal and state organizations. These policies and algorithms were followed during the patient's care in the ED.    Most Likely DDx:  -ACS given his risk factors and age but will also get a CT dissection given he states that the pain radiates into his back he is got some tingling in his arm and he is hypertensive.  We will give patient some IV morphine to help with the pain.  We will hold off on aspirin given patient reports an allergy.  Also trial some nitro.  DDx that was also considered d/t potential to cause harm, but was found less likely based on history and  physical (as detailed above): -PNA (no fevers, cough but CXR to evaluate) -PNX (reassured with equal b/l breath sounds, CXR to evaluate) -Symptomatic  anemia (will get H&H) -Pulmonary embolism as no sob at rest, not pleuritic in nature, no hypoxia -Pericarditis no rub on exam, EKG changes or hx to suggest dx -Tamponade (no notable SOB, tachycardic, hypotensive) -Esophageal rupture (no h/o diffuse vomitting/no crepitus)  Sugar elevated at 226 but no evidence of DKA  White count slightly elevated 10.8  Initial troponin was 6  No change in patient's chest pain with the nitro.  Will give some morphine.  Patient handed off to oncoming team pending CT scan and repeat troponin.      ____________________________________________   FINAL CLINICAL IMPRESSION(S) / ED DIAGNOSES   Final diagnoses:  Chest pain, unspecified type     MEDICATIONS GIVEN DURING THIS VISIT:  Medications  nitroGLYCERIN (NITROSTAT) SL tablet 0.4 mg (0.4 mg Sublingual Given 12/12/19 0626)  iohexol (OMNIPAQUE) 350 MG/ML injection 100 mL (has no administration in time range)  morphine 4 MG/ML injection 6 mg (6 mg Intravenous Given 12/12/19 0633)  ondansetron (ZOFRAN) injection 4 mg (4 mg Intravenous Given 12/12/19 0617)     ED Discharge Orders    None       Note:  This document was prepared using Dragon voice recognition software and may include unintentional dictation errors.   Vanessa [place], MD 12/12/19 [phone]

## 2019-12-12 NOTE — ED Notes (Signed)
Admitting MD at bedside.

## 2019-12-13 DIAGNOSIS — K219 Gastro-esophageal reflux disease without esophagitis: Secondary | ICD-10-CM | POA: Diagnosis not present

## 2019-12-13 DIAGNOSIS — R079 Chest pain, unspecified: Secondary | ICD-10-CM | POA: Diagnosis not present

## 2019-12-13 DIAGNOSIS — E119 Type 2 diabetes mellitus without complications: Secondary | ICD-10-CM | POA: Diagnosis not present

## 2019-12-13 DIAGNOSIS — I160 Hypertensive urgency: Secondary | ICD-10-CM | POA: Diagnosis not present

## 2019-12-13 LAB — GLUCOSE, CAPILLARY
Glucose-Capillary: 159 mg/dL — ABNORMAL HIGH (ref 70–99)
Glucose-Capillary: 209 mg/dL — ABNORMAL HIGH (ref 70–99)

## 2019-12-13 LAB — BASIC METABOLIC PANEL
Anion gap: 10 (ref 5–15)
BUN: 19 mg/dL (ref 8–23)
CO2: 22 mmol/L (ref 22–32)
Calcium: 9.3 mg/dL (ref 8.9–10.3)
Chloride: 106 mmol/L (ref 98–111)
Creatinine, Ser: 1.08 mg/dL (ref 0.61–1.24)
GFR calc Af Amer: >60 mL/min (ref >60)
GFR calc non Af Amer: >60 mL/min (ref >60)
Glucose, Bld: 166 mg/dL — ABNORMAL HIGH (ref 70–99)
Potassium: 3.7 mmol/L (ref 3.5–5.1)
Sodium: 138 mmol/L (ref 135–145)

## 2019-12-13 MED ORDER — ISOSORBIDE MONONITRATE ER 30 MG PO TB24: 30.0000 mg | ORAL_TABLET | Freq: Every day | ORAL | 0 refills | Status: DC

## 2019-12-13 MED ORDER — RIVAROXABAN 20 MG PO TABS
20.0000 mg | ORAL_TABLET | Freq: Every day | ORAL | 0 refills | Status: DC
Start: 2019-12-13 — End: 2020-08-09

## 2019-12-13 MED ORDER — ISOSORBIDE MONONITRATE ER 30 MG PO TB24
30.0000 mg | ORAL_TABLET | Freq: Every day | ORAL | Status: DC
  Administered 2019-12-13: 30 mg via ORAL
  Filled 2019-12-13: qty 1

## 2019-12-13 NOTE — Plan of Care (Signed)

## 2019-12-13 NOTE — Discharge Summary (Signed)
Physician Discharge Summary  Bryan Scott:[id number] DOB: Jun 20, 1945 DOA: 12/12/2019  PCP: [name], MD  Admit date: 12/12/2019 Discharge date: 12/13/2019  Time spent: 45 minutes  Recommendations for Outpatient Follow-up:  Patient will be discharged to home.  Patient will need to follow up with primary care provider within one week of discharge.  Follow-up with cardiology.  Patient should continue medications as prescribed.  Patient should follow a heart healthy/carb modified diet.   Discharge Diagnoses:  Chest pain Hypertensive urgency/uncontrolled essential hypertension Diabetes mellitus, type II History of paroxysmal atrial fibrillation Hyperlipidemia  Discharge Condition: Stable  Diet recommendation: Heart healthy/carb modified  Filed Weights   12/12/19 0452 12/13/19 0536  Weight: 89.7 kg 86 kg    History of present illness:  On 12/12/2019 by Dr. Royce Macadamia Bryan Scott is a 74 y.o. male with medical history significant for paroxysmal atrial fibrillation on anticoagulation therapy, history of diabetes mellitus, coronary artery disease status post CABG, status post stent angioplasty who presents to the emergency room for evaluation of chest pain.  He describes the chest pain as a sharp stabbing sensation that radiates to his back and both arms with an occasional tingling sensation in the left arm.  He rates his pain an 10 x 10 in intensity at its worst with no relieving or aggravating factors.  Chest pain is not associated with shortness of breath but he denies having any nausea, vomiting, diaphoresis or palpitations. He had mild relief of his pain with nitroglycerin patch. Vital signs reveal elevated blood pressure with systolic blood pressure of about 132mmHg upon arrival to the ER. Labs reveal negative troponin and mild hypokalemia 3.4 Patient had a CT angiogram of the chest which did not show any acute findings Twelve-lead EKG showed sinus rhythm with T wave  inversions in the lateral leads  Hospital Course:  Chest pain -Patient with known history of CAD status post CABG -He [facility] -Troponins have been cycled and unremarkable, EKG does not show any acute findings -Nuclear stress test was low risk -Patient continued to have chest pain today, cardiology was consulted and appreciated and recommended patient follow-up with his cardiologist as outpatient, trial of Imdur  Hypertensive urgency/uncontrolled essential hypertension -BP controlled -Continue hydralazine, Coreg, amlodipine  Diabetes mellitus, type II -Continue home medications: Jardiance, metoprolol, glipizide  History of paroxysmal atrial fibrillation -Continue Coreg for rate control as well as Xarelto  Hyperlipidemia -Continue statin  Procedures: Nuclear stress test  Consultations: Cardiology  Discharge Exam: Vitals:   12/13/19 0745 12/13/19 1138  BP: (!) 141/80 106/64  Pulse: 60 (!) 55  Resp: 18 16  Temp: 98.3 F (36.8 C) 97.8 F (36.6 C)  SpO2: 98% 97%     General: Well developed, well nourished, NAD, appears stated age  HEENT: NCAT,mucous membranes moist.  Cardiovascular: S1 S2 auscultated, RRR  Respiratory: Clear to auscultation bilaterally   Abdomen: Soft, nontender, nondistended, + bowel sounds  Extremities: warm dry without cyanosis clubbing or edema  Neuro: AAOx3, nonfocal  Psych: Normal affect and demeanor with intact judgement and insight  Discharge Instructions Discharge Instructions    Discharge instructions   Complete by: As directed    Patient will be discharged to home.  Patient will need to follow up with primary care provider within one week of discharge.  Follow-up with cardiology.  Patient should continue medications as prescribed.  Patient should follow a heart healthy/carb modified diet.     Allergies as of 12/13/2019  Reactions   Aspergum [aspirin] Anaphylaxis   Heparin Other (See Comments)    Reaction:  Bleeding    Lisinopril Swelling      Medication List    STOP taking these medications   fluticasone 50 MCG/ACT nasal spray Commonly known as: FLONASE     TAKE these medications   amLODipine 5 MG tablet Commonly known as: NORVASC Take 1 tablet (5 mg total) by mouth daily. What changed: how much to take   atorvastatin 80 MG tablet Commonly known as: LIPITOR Take 80 mg by mouth daily.   carvedilol 12.5 MG tablet Commonly known as: COREG Take 1 tablet (12.5 mg total) by mouth every 12 (twelve) hours. What changed:   how much to take  when to take this   ezetimibe 10 MG tablet Commonly known as: ZETIA Take 1 tablet (10 mg total) by mouth daily.   glipiZIDE 10 MG tablet Commonly known as: GLUCOTROL Take 10 mg by mouth 2 (two) times daily before a meal.   guaiFENesin-dextromethorphan 100-10 MG/5ML syrup Commonly known as: ROBITUSSIN DM Take 5 mLs by mouth every 4 (four) hours as needed for cough.   hydrALAZINE 25 MG tablet Commonly known as: APRESOLINE Take 25 mg by mouth 3 (three) times daily.   isosorbide mononitrate 30 MG 24 hr tablet Commonly known as: IMDUR Take 1 tablet (30 mg total) by mouth daily. Start taking on: December 14, 2019   Jardiance 25 MG Tabs tablet Generic drug: empagliflozin Take 25 mg by mouth daily.   metFORMIN 500 MG (MOD) 24 hr tablet Commonly known as: GLUMETZA Take 500 mg by mouth 2 (two) times daily with a meal.   morphine 15 MG 12 hr tablet Commonly known as: MS CONTIN Take 15 mg by mouth every 12 (twelve) hours as needed for pain.   ondansetron 8 MG tablet Commonly known as: ZOFRAN Take 8 mg by mouth every 8 (eight) hours as needed for nausea or vomiting.   rivaroxaban 20 MG Tabs tablet Commonly known as: XARELTO Take 1 tablet (20 mg total) by mouth daily with supper.   spironolactone 25 MG tablet Commonly known as: ALDACTONE Take 1 tablet (25 mg total) by mouth daily.      Allergies  Allergen Reactions  .  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 Heparin Other (See Comments)    Reaction:  Bleeding   . Lisinopril Swelling    Follow-up Information    [name], MD. Schedule an appointment as soon as possible for a visit in 1 week(s).   Specialty: Family Medicine Why: Hospital follow up Contact information: [address] [place] [address] [phone]                The results of significant diagnostics from this hospitalization (including imaging, microbiology, ancillary and laboratory) are listed below for reference.    Significant Diagnostic Studies: NM Myocar Multi W/Spect W/Wall Motion / EF  Result Date: 12/12/2019  Blood pressure demonstrated a normal response to exercise.  There was no ST segment deviation noted during stress.  Defect 1: There is a small defect of mild severity present in the apex location.  This is a low risk study.  The left ventricular ejection fraction is normal (55-65%).    DG Chest Portable 1 View  Result Date: 12/12/2019 CLINICAL DATA:  Chest pain EXAM: PORTABLE CHEST 1 VIEW COMPARISON:  11/09/2019 FINDINGS: Normal heart size and mediastinal contours. CABG. There is no edema, consolidation, effusion, or pneumothorax. Artifact from EKG leads. IMPRESSION:  No evidence of acute disease. Electronically Signed   By: Monte Fantasia M.D.   On: 12/12/2019 05:19   CT Angio Chest/Abd/Pel for Dissection W and/or Wo Contrast  Result Date: 12/12/2019 CLINICAL DATA:  Chest and abdominal pain. EXAM: CT ANGIOGRAPHY CHEST, ABDOMEN AND PELVIS TECHNIQUE: Non-contrast CT of the chest was initially obtained. Multidetector CT imaging through the chest, abdomen and pelvis was performed using the standard protocol during bolus administration of intravenous contrast. Multiplanar reconstructed images and MIPs were obtained and reviewed to evaluate the vascular anatomy. CONTRAST:  129mL OMNIPAQUE IOHEXOL 350 MG/ML SOLN COMPARISON:  Chest CT 07/16/2019. FINDINGS: CTA  CHEST FINDINGS Cardiovascular: The heart is normal in size. No pericardial effusion. Mild tortuosity, ectasia and atherosclerotic calcifications involving the thoracic aorta are. No dissection or focal aneurysm. Three-vessel coronary artery calcifications are noted. There are surgical changes from coronary artery bypass surgery. The aortic branch vessels are patent. The pulmonary arteries appear normal. No filling defects to suggest pulmonary embolism. Mediastinum/Nodes: No mediastinal or hilar mass or adenopathy. The esophagus is grossly normal. Lungs/Pleura: Biapical pleural and parenchymal scarring changes. No worrisome pulmonary lesions or acute pulmonary findings. A few tiny scattered subpleural pulmonary nodules are stable. No infiltrates or effusions. Musculoskeletal: No significant bony findings. Review of the MIP images confirms the above findings. CTA ABDOMEN AND PELVIS FINDINGS VASCULAR Aorta: Normal caliber. Moderate atherosclerotic calcifications. No dissection. Celiac: Ostial calcifications but no significant stenosis. The right hepatic artery comes directly off the aorta adjacent to the celiac axis. SMA: Minimal ostial calcifications. No stenosis or dissection. Renals: Mild atherosclerotic calcifications. No significant stenosis. IMA: Patent. Inflow: Advanced atherosclerotic calcifications involving both common iliac arteries. A right common iliac artery stent is noted. Veins: Grossly normal. Review of the MIP images confirms the above findings. NON-VASCULAR Hepatobiliary: No hepatic lesions are identified. The gallbladder is grossly normal. No common bile duct dilatation. Pancreas: No mass, inflammation or ductal dilatation. Spleen: Normal size. No focal lesions. Adrenals/Urinary Tract: The adrenal glands and kidneys are unremarkable except for a 11 mm right renal calculus. The bladder is unremarkable. Stomach/Bowel: The stomach, duodenum, small bowel and colon are grossly normal without oral  contrast. No acute inflammatory changes, mass lesions or obstructive findings. The terminal ileum is normal. The appendix is normal. Lymphatic: No abdominal or pelvic lymphadenopathy. Reproductive: The prostate gland and seminal vesicles are unremarkable. Other: No pelvic mass or adenopathy. No free pelvic fluid collections. No inguinal mass or adenopathy. No abdominal wall hernia or subcutaneous lesions. Musculoskeletal: Evidence of prior lumbar spine surgery. Surgical changes involving the left iliac bone likely from bone graft harvest procedure. No acute bony findings. Review of the MIP images confirms the above findings. IMPRESSION: 1. No CT findings for pulmonary embolism. 2. Mild tortuosity, ectasia and atherosclerotic calcifications involving the thoracic aorta but no aneurysm or dissection. 3. Three-vessel coronary artery calcifications and surgical changes from coronary artery bypass surgery. 4. Abdominal aortic and iliac artery atherosclerosis as detailed above. The major branch vessels are patent. Right common iliac artery stent noted. 5. No acute pulmonary findings. 6. No acute abdominal/pelvic findings, mass lesions or lymphadenopathy. 7. 11 mm right renal calculus. Aortic Atherosclerosis (ICD10-I70.0). Electronically Signed   By: Marijo Sanes M.D.   On: 12/12/2019 07:41    Microbiology: Recent Results (from the past 240 hour(s))  SARS Coronavirus 2 by RT PCR (hospital order, performed in [facility] hospital lab) Nasopharyngeal Nasopharyngeal Swab     Status: None   Collection Time: 12/12/19  8:40 AM  Specimen: Nasopharyngeal Swab  Result Value Ref Range Status   SARS Coronavirus 2 NEGATIVE NEGATIVE Final    Comment: (NOTE) SARS-CoV-2 target nucleic acids are NOT DETECTED.  The SARS-CoV-2 RNA is generally detectable in upper and lower respiratory specimens during the acute phase of infection. The lowest concentration of SARS-CoV-2 viral copies this assay can detect is 250 copies / mL.  A negative result does not preclude SARS-CoV-2 infection and should not be used as the sole basis for treatment or other patient management decisions.  A negative result may occur with improper specimen collection / handling, submission of specimen other than nasopharyngeal swab, presence of viral mutation(s) within the areas targeted by this assay, and inadequate number of viral copies (<250 copies / mL). A negative result must be combined with clinical observations, patient history, and epidemiological information.  Fact Sheet for Patients:   StrictlyIdeas.no  Fact Sheet for Healthcare Providers: BankingDealers.co.za  This test is not yet approved or  cleared by the [place] FDA and has been authorized for detection and/or diagnosis of SARS-CoV-2 by FDA under an Emergency Use Authorization (EUA).  This EUA will remain in effect (meaning this test can be used) for the duration of the COVID-19 declaration under Section 564(b)(1) of the Act, 21 U.S.C. section 360bbb-3(b)(1), unless the authorization is terminated or revoked sooner.  Performed at [facility], [address] [place], [place] [address]      Labs: Basic Metabolic Panel: Recent Labs  Lab 12/12/19 0518 12/13/19 0422  NA 136 138  K 3.4* 3.7  CL 103 106  CO2 21* 22  GLUCOSE 226* 166*  BUN 16 19  CREATININE 0.98 1.08  CALCIUM 9.5 9.3   Liver Function Tests: Recent Labs  Lab 12/12/19 0518  AST 22  ALT 27  ALKPHOS 86  BILITOT 1.0  PROT 7.9  ALBUMIN 4.0   Recent Labs  Lab 12/12/19 0518  LIPASE 50   No results for input(s): AMMONIA in the last 168 hours. CBC: Recent Labs  Lab 12/12/19 0518  WBC 10.8*  HGB 16.8  HCT 48.2  MCV 93.2  PLT 214   Cardiac Enzymes: No results for input(s): CKTOTAL, CKMB, CKMBINDEX, TROPONINI in the last 168 hours. BNP: BNP (last 3 results) Recent Labs    07/16/19 0054 12/12/19 0518  BNP 127.0* 57.5     ProBNP (last 3 results) No results for input(s): PROBNP in the last 8760 hours.  CBG: Recent Labs  Lab 12/12/19 1151 12/12/19 1631 12/12/19 2254 12/13/19 0747 12/13/19 1141  GLUCAP 161* 225* 139* 159* 209*       Signed:  Dyshon Philbin  [facility] 12/13/2019, 1:15 PM

## 2019-12-13 NOTE — Progress Notes (Signed)
Patient given discharge instructions. IV taken out and tele monitor off. Patient verbalized understanding without any questions or concerns. Patient verbalizes he will set up an appointment at the [facility] clinic and he has no issues with getting his new medication, he is not new on xarelto.

## 2019-12-13 NOTE — Discharge Instructions (Signed)

## 2019-12-13 NOTE — Consult Note (Signed)
CARDIOLOGY CONSULT NOTE               Patient ID: Bryan Scott MRN: [id number] DOB/AGE: Apr 10, 1946 74 y.o.  Admit date: 12/12/2019 Referring Physician Bryan Scott Primary Physician [facility] Primary Cardiologist [facility] Reason for Consultation chest pain  HPI: 74 year old male referred for evaluation of chest pain. The patient has a history of coronary artery disease, status post MI and CABG x 4 about 15 years ago at [facility], multiple stents, ischemic cardiomyopathy with LVEF 40-45% per echocardiogram in 08/2019, paroxsymal atrial fibrillation on Xarelto, type II diabetes, hypertension, hyperlipidemia, and bilateral carotid artery stenosis, status post stenting. The patient reports waking up on 12/11/2019 with sharp, stabbing, left sided chest pain with radiation to his left neck with associated shortness of breath. He went back to sleep, but the pain persisted/worsened, so he presented to [facility] ER for further evaluation. Admission labs were notable for normal high sensitivity troponin (6, 5), potassium 3.4, normal renal function, essentially normal CBC, and normal BNP. Chest CTA was negative for acute abnormalities. ECG revealed sinus rhythm at a rate of 60 bpm with 1st degree AV block with nonspecific ST-T wave abnormalities. The patient was given Morphine, which seemed to help the most, and nitroglycerin paste, which eased the pain. He states that the pain was especially notable when being transferred from the hospital bed to the nuclear camera for nuclear stress testing. The pain is also reproducible to palpation of the left chest, with upper body movement, and positioning in bed. He underwent a nuclear stress test yesterday which revealed LVEF 55-65% with mild fixed apical defect, scar versus artifact, without evidence for ischemia, and overall low risk. At this time, the patient states he does not wish to undergo any further imaging or cardiac catheterization, and feels comfortable going home and following up with  cardiologist at the [facility].    Review of systems complete and found to be negative unless listed above     Past Medical History:  Diagnosis Date  . 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 Cancer ([place])   . Carotid artery disease (HCC)    > 75% bilateral ICA stenoses on 01/2013 CT  . Chronic pain syndrome   . Coronary artery disease    s/p CABG ~ 2007  . GERD (gastroesophageal reflux disease)   . Heparin allergy    Bleeding  . History of tobacco use   . Hyperlipidemia   . Hypertension   . Ischemic cardiomyopathy   . Paroxysmal atrial fibrillation (HCC)    On Xarelto anticoagulation  . Type 2 diabetes mellitus (HCC)    A1C 6.8% in 01/2013    Past Surgical History:  Procedure Laterality Date  . CARDIAC CATHETERIZATION    . CORONARY ARTERY BYPASS GRAFT  2007    Medications Prior to Admission  Medication Sig Dispense Refill Last Dose  . amLODipine (NORVASC) 5 MG tablet Take 1 tablet (5 mg total) by mouth daily. (Patient taking differently: Take 10 mg by mouth daily. ) 30 tablet 0 12/11/2019 at Unknown time  . atorvastatin (LIPITOR) 80 MG tablet Take 80 mg by mouth daily.   12/11/2019 at Unknown time  . carvedilol (COREG) 12.5 MG tablet Take 1 tablet (12.5 mg total) by mouth every 12 (twelve) hours. (Patient taking differently: Take 25 mg by mouth 2 (two) times daily with a meal. ) 30 tablet 0 12/11/2019 at Unknown time  . empagliflozin (JARDIANCE) 25 MG TABS tablet Take 25 mg by mouth daily.  12/11/2019 at Unknown time  . ezetimibe (ZETIA) 10 MG tablet Take 1 tablet (10 mg total) by mouth daily. 30 tablet 0 12/11/2019 at Unknown time  . glipiZIDE (GLUCOTROL) 10 MG tablet Take 10 mg by mouth 2 (two) times daily before a meal.   12/11/2019 at Unknown time  . hydrALAZINE (APRESOLINE) 25 MG tablet Take 25 mg by mouth 3 (three) times daily.   12/11/2019 at Unknown time  . metFORMIN (GLUMETZA) 500 MG (MOD) 24 hr tablet Take 500 mg by mouth 2 (two) times daily with a meal.   12/11/2019 at Unknown  time  . spironolactone (ALDACTONE) 25 MG tablet Take 1 tablet (25 mg total) by mouth daily. 30 tablet 0 12/11/2019 at Unknown time  . fluticasone (FLONASE) 50 MCG/ACT nasal spray Place 1 spray into both nostrils 2 (two) times daily. Stop after 10 days (Patient not taking: Reported on 12/12/2019) 15.8 mL 0 Not Taking at Unknown time  . guaiFENesin-dextromethorphan (ROBITUSSIN DM) 100-10 MG/5ML syrup Take 5 mLs by mouth every 4 (four) hours as needed for cough. 118 mL 0 PRN at PRN  . morphine (MS CONTIN) 15 MG 12 hr tablet Take 15 mg by mouth every 12 (twelve) hours as needed for pain.   PRN at PRN  . ondansetron (ZOFRAN) 8 MG tablet Take 8 mg by mouth every 8 (eight) hours as needed for nausea or vomiting.    PRN at PRN  . rivaroxaban (XARELTO) 20 MG TABS tablet Take 1 tablet (20 mg total) by mouth daily with supper. (Patient not taking: Reported on 12/12/2019) 30 tablet 2 Not Taking at Unknown time   Social History   Socioeconomic History  . Marital status: Married    Spouse name: Not on file  . Number of children: Not on file  . Years of education: Not on file  . Highest education level: Not on file  Occupational History  . Not on file  Tobacco Use  . Smoking status: Former Smoker    Packs/day: 1.00    Years: 30.00    Pack years: 30.00    Types: Cigarettes  . Smokeless tobacco: Never Used  . Tobacco comment: 30 pack-year history. Quit 8-9 years ago.   Substance and Sexual Activity  . Alcohol use: No  . Drug use: No  . Sexual activity: Not on file  Other Topics Concern  . Not on file  Social History Narrative  . Not on file   Social Determinants of Health   Financial Resource Strain:   . Difficulty of Paying Living Expenses:   Food Insecurity:   . Worried About Charity fundraiser in the Last Year:   . Arboriculturist in the Last Year:   Transportation Needs:   . Film/video editor (Medical):   Marland Kitchen Lack of Transportation (Non-Medical):   Physical Activity:   . Days of  Exercise per Week:   . Minutes of Exercise per Session:   Stress:   . Feeling of Stress :   Social Connections:   . Frequency of Communication with Friends and Family:   . Frequency of Social Gatherings with Friends and Family:   . Attends Religious Services:   . Active Member of Clubs or Organizations:   . Attends Archivist Meetings:   Marland Kitchen Marital Status:   Intimate Partner Violence:   . Fear of Current or Ex-Partner:   . Emotionally Abused:   Marland Kitchen Physically Abused:   . Sexually Abused:  Family History  Problem Relation Age of Onset  . Heart disease Mother 41       Deceased  . CVA Mother   . Seizures Father 83       Deceased from complications with seizures      Review of systems complete and found to be negative unless listed above      PHYSICAL EXAM  General: Well developed, well nourished, in no acute distress, lying in bed HEENT:  Normocephalic and atramatic Neck:  No JVD.  Lungs: Clear bilaterally to auscultation, normal effort of breathing on room air Heart: HRRR . Normal S1 and S2 without gallops or murmurs.  Chest wall: Palpation of left side of chest reproduces pain and patient winces and pulls away. No obvious skin lesions, bruising, erythema or edema present Abdomen: nondistended Msk:  Back normal, gait not assessed. No obvious deformities Extremities: No clubbing, cyanosis or edema.   Neuro: Alert and oriented X 3. Psych:  Good affect, responds appropriately  Labs:   Lab Results  Component Value Date   WBC 10.8 (H) 12/12/2019   HGB 16.8 12/12/2019   HCT 48.2 12/12/2019   MCV 93.2 12/12/2019   PLT 214 12/12/2019    Recent Labs  Lab 12/12/19 0518 12/12/19 0518 12/13/19 0422  NA 136   < > 138  K 3.4*   < > 3.7  CL 103   < > 106  CO2 21*   < > 22  BUN 16   < > 19  CREATININE 0.98   < > 1.08  CALCIUM 9.5   < > 9.3  PROT 7.9  --   --   BILITOT 1.0  --   --   ALKPHOS 86  --   --   ALT 27  --   --   AST 22  --   --   GLUCOSE 226*    < > 166*   < > = values in this interval not displayed.   Lab Results  Component Value Date   CKTOTAL 103 02/15/2013   CKMB 4.6 (H) 10/22/2013   TROPONINI <0.03 09/26/2016    Lab Results  Component Value Date   CHOL 232 (H) 09/10/2019   CHOL 243 (H) 07/16/2019   CHOL 107 09/22/2016   Lab Results  Component Value Date   HDL 32 (L) 09/10/2019   HDL 31 (L) 07/16/2019   HDL 25 (L) 09/22/2016   Lab Results  Component Value Date   LDLCALC 165 (H) 09/10/2019   LDLCALC 165 (H) 07/16/2019   LDLCALC 31 09/22/2016   Lab Results  Component Value Date   TRIG 173 (H) 09/10/2019   TRIG 237 (H) 07/16/2019   TRIG 253 (H) 09/22/2016   Lab Results  Component Value Date   CHOLHDL 7.3 09/10/2019   CHOLHDL 7.8 07/16/2019   CHOLHDL 4.3 09/22/2016   No results found for: LDLDIRECT    Radiology: NM Myocar Multi W/Spect W/Wall Motion / EF  Result Date: 12/12/2019  Blood pressure demonstrated a normal response to exercise.  There was no ST segment deviation noted during stress.  Defect 1: There is a small defect of mild severity present in the apex location.  This is a low risk study.  The left ventricular ejection fraction is normal (55-65%).    DG Chest Portable 1 View  Result Date: 12/12/2019 CLINICAL DATA:  Chest pain EXAM: PORTABLE CHEST 1 VIEW COMPARISON:  11/09/2019 FINDINGS: Normal heart size and mediastinal contours. CABG. There is no  edema, consolidation, effusion, or pneumothorax. Artifact from EKG leads. IMPRESSION: No evidence of acute disease. Electronically Signed   By: Monte Fantasia M.D.   On: 12/12/2019 05:19   CT Angio Chest/Abd/Pel for Dissection W and/or Wo Contrast  Result Date: 12/12/2019 CLINICAL DATA:  Chest and abdominal pain. EXAM: CT ANGIOGRAPHY CHEST, ABDOMEN AND PELVIS TECHNIQUE: Non-contrast CT of the chest was initially obtained. Multidetector CT imaging through the chest, abdomen and pelvis was performed using the standard protocol during bolus  administration of intravenous contrast. Multiplanar reconstructed images and MIPs were obtained and reviewed to evaluate the vascular anatomy. CONTRAST:  128mL OMNIPAQUE IOHEXOL 350 MG/ML SOLN COMPARISON:  Chest CT 07/16/2019. FINDINGS: CTA CHEST FINDINGS Cardiovascular: The heart is normal in size. No pericardial effusion. Mild tortuosity, ectasia and atherosclerotic calcifications involving the thoracic aorta are. No dissection or focal aneurysm. Three-vessel coronary artery calcifications are noted. There are surgical changes from coronary artery bypass surgery. The aortic branch vessels are patent. The pulmonary arteries appear normal. No filling defects to suggest pulmonary embolism. Mediastinum/Nodes: No mediastinal or hilar mass or adenopathy. The esophagus is grossly normal. Lungs/Pleura: Biapical pleural and parenchymal scarring changes. No worrisome pulmonary lesions or acute pulmonary findings. A few tiny scattered subpleural pulmonary nodules are stable. No infiltrates or effusions. Musculoskeletal: No significant bony findings. Review of the MIP images confirms the above findings. CTA ABDOMEN AND PELVIS FINDINGS VASCULAR Aorta: Normal caliber. Moderate atherosclerotic calcifications. No dissection. Celiac: Ostial calcifications but no significant stenosis. The right hepatic artery comes directly off the aorta adjacent to the celiac axis. SMA: Minimal ostial calcifications. No stenosis or dissection. Renals: Mild atherosclerotic calcifications. No significant stenosis. IMA: Patent. Inflow: Advanced atherosclerotic calcifications involving both common iliac arteries. A right common iliac artery stent is noted. Veins: Grossly normal. Review of the MIP images confirms the above findings. NON-VASCULAR Hepatobiliary: No hepatic lesions are identified. The gallbladder is grossly normal. No common bile duct dilatation. Pancreas: No mass, inflammation or ductal dilatation. Spleen: Normal size. No focal lesions.  Adrenals/Urinary Tract: The adrenal glands and kidneys are unremarkable except for a 11 mm right renal calculus. The bladder is unremarkable. Stomach/Bowel: The stomach, duodenum, small bowel and colon are grossly normal without oral contrast. No acute inflammatory changes, mass lesions or obstructive findings. The terminal ileum is normal. The appendix is normal. Lymphatic: No abdominal or pelvic lymphadenopathy. Reproductive: The prostate gland and seminal vesicles are unremarkable. Other: No pelvic mass or adenopathy. No free pelvic fluid collections. No inguinal mass or adenopathy. No abdominal wall hernia or subcutaneous lesions. Musculoskeletal: Evidence of prior lumbar spine surgery. Surgical changes involving the left iliac bone likely from bone graft harvest procedure. No acute bony findings. Review of the MIP images confirms the above findings. IMPRESSION: 1. No CT findings for pulmonary embolism. 2. Mild tortuosity, ectasia and atherosclerotic calcifications involving the thoracic aorta but no aneurysm or dissection. 3. Three-vessel coronary artery calcifications and surgical changes from coronary artery bypass surgery. 4. Abdominal aortic and iliac artery atherosclerosis as detailed above. The major branch vessels are patent. Right common iliac artery stent noted. 5. No acute pulmonary findings. 6. No acute abdominal/pelvic findings, mass lesions or lymphadenopathy. 7. 11 mm right renal calculus. Aortic Atherosclerosis (ICD10-I70.0). Electronically Signed   By: Marijo Sanes M.D.   On: 12/12/2019 07:41    EKG: sinus rhythm  ASSESSMENT AND PLAN:  1. Chest pain, atypical, likely musculoskeletal as it is reproducible to palpation and worse with movement of upper body, with normal high sensitivity  troponin, ECG with nonspecific abnormalities, and low risk nuclear study during this admission. 2. Known coronary artery disease, status post MI and CABG x 4 15 years ago. 3. Hypertension 4.  Hyperlipidemia 5. Type II diabetes 6. Paroxysmal atrial fibrillation, on Xarelto for stroke prevention and carvedilol for rate control  Recommendations: 1. Trial of isosorbide mononitrate 30 mg daily 2. Defer cardiac catheteization as chest pain is atypical, nuclear stress test is a low risk study, and troponin are normal x 2. Defer further cardiac diagnostics at this time. 3. Continue Xarelto for stroke prevention, carvedilol, Zetia, Jardiance, atorvastatin, amlodipine, and spironolactone.  4. Follow up with cardiologist at the [facility] next week 5. May be discharged from cardiovascular perspective.   Signed: Clabe Seal PA-C 12/13/2019, 8:31 AM    Sign off for now; please call with any questions. Discussed with Dr. Saralyn Pilar who agrees with the above plan.

## 2019-12-14 LAB — HEMOGLOBIN A1C
Hgb A1c MFr Bld: 7.8 % — ABNORMAL HIGH (ref 4.8–5.6)
Mean Plasma Glucose: 177 mg/dL

## 2019-12-15 ENCOUNTER — Other Ambulatory Visit: Payer: Self-pay

## 2019-12-15 NOTE — Progress Notes (Signed)
[facility] specimen # [id number] on. Account # [id number]  AMD

## 2020-07-11 ENCOUNTER — Emergency Department: Payer: Medicare PPO

## 2020-07-11 ENCOUNTER — Inpatient Hospital Stay
Admission: EM | Admit: 2020-07-11 | Discharge: 2020-07-21 | DRG: 247 | Disposition: A | Discharge status: Home Health | Payer: Medicare PPO | Source: Ambulatory Visit | Attending: Family Medicine | Admitting: Family Medicine

## 2020-07-11 ENCOUNTER — Ambulatory Visit (INDEPENDENT_AMBULATORY_CARE_PROVIDER_SITE_OTHER): Payer: Medicare PPO | Admitting: Podiatry

## 2020-07-11 ENCOUNTER — Encounter: Payer: Self-pay | Admitting: Emergency Medicine

## 2020-07-11 ENCOUNTER — Other Ambulatory Visit: Payer: Self-pay

## 2020-07-11 ENCOUNTER — Encounter: Payer: Self-pay | Admitting: Podiatry

## 2020-07-11 DIAGNOSIS — Z87892 Personal history of anaphylaxis: Secondary | ICD-10-CM

## 2020-07-11 DIAGNOSIS — J342 Deviated nasal septum: Secondary | ICD-10-CM | POA: Diagnosis present

## 2020-07-11 DIAGNOSIS — E1165 Type 2 diabetes mellitus with hyperglycemia: Secondary | ICD-10-CM

## 2020-07-11 DIAGNOSIS — E118 Type 2 diabetes mellitus with unspecified complications: Secondary | ICD-10-CM | POA: Diagnosis present

## 2020-07-11 DIAGNOSIS — F419 Anxiety disorder, unspecified: Secondary | ICD-10-CM | POA: Diagnosis present

## 2020-07-11 DIAGNOSIS — Y92230 Patient room in hospital as the place of occurrence of the external cause: Secondary | ICD-10-CM | POA: Diagnosis not present

## 2020-07-11 DIAGNOSIS — Z87891 Personal history of nicotine dependence: Secondary | ICD-10-CM

## 2020-07-11 DIAGNOSIS — Z8249 Family history of ischemic heart disease and other diseases of the circulatory system: Secondary | ICD-10-CM

## 2020-07-11 DIAGNOSIS — Z7984 Long term (current) use of oral hypoglycemic drugs: Secondary | ICD-10-CM

## 2020-07-11 DIAGNOSIS — R1310 Dysphagia, unspecified: Secondary | ICD-10-CM | POA: Diagnosis present

## 2020-07-11 DIAGNOSIS — E861 Hypovolemia: Secondary | ICD-10-CM | POA: Diagnosis present

## 2020-07-11 DIAGNOSIS — N179 Acute kidney failure, unspecified: Secondary | ICD-10-CM | POA: Diagnosis not present

## 2020-07-11 DIAGNOSIS — I110 Hypertensive heart disease with heart failure: Secondary | ICD-10-CM | POA: Diagnosis present

## 2020-07-11 DIAGNOSIS — Z951 Presence of aortocoronary bypass graft: Secondary | ICD-10-CM

## 2020-07-11 DIAGNOSIS — Z20822 Contact with and (suspected) exposure to covid-19: Secondary | ICD-10-CM | POA: Diagnosis present

## 2020-07-11 DIAGNOSIS — E1151 Type 2 diabetes mellitus with diabetic peripheral angiopathy without gangrene: Secondary | ICD-10-CM

## 2020-07-11 DIAGNOSIS — R599 Enlarged lymph nodes, unspecified: Secondary | ICD-10-CM | POA: Diagnosis present

## 2020-07-11 DIAGNOSIS — I255 Ischemic cardiomyopathy: Secondary | ICD-10-CM | POA: Diagnosis present

## 2020-07-11 DIAGNOSIS — E1159 Type 2 diabetes mellitus with other circulatory complications: Secondary | ICD-10-CM | POA: Diagnosis present

## 2020-07-11 DIAGNOSIS — H9319 Tinnitus, unspecified ear: Secondary | ICD-10-CM | POA: Diagnosis present

## 2020-07-11 DIAGNOSIS — I10 Essential (primary) hypertension: Secondary | ICD-10-CM | POA: Diagnosis present

## 2020-07-11 DIAGNOSIS — T50915A Adverse effect of multiple unspecified drugs, medicaments and biological substances, initial encounter: Secondary | ICD-10-CM | POA: Diagnosis present

## 2020-07-11 DIAGNOSIS — I2510 Atherosclerotic heart disease of native coronary artery without angina pectoris: Secondary | ICD-10-CM | POA: Diagnosis present

## 2020-07-11 DIAGNOSIS — D72829 Elevated white blood cell count, unspecified: Secondary | ICD-10-CM | POA: Diagnosis present

## 2020-07-11 DIAGNOSIS — Z8551 Personal history of malignant neoplasm of bladder: Secondary | ICD-10-CM

## 2020-07-11 DIAGNOSIS — E871 Hypo-osmolality and hyponatremia: Secondary | ICD-10-CM | POA: Diagnosis present

## 2020-07-11 DIAGNOSIS — E785 Hyperlipidemia, unspecified: Secondary | ICD-10-CM | POA: Diagnosis present

## 2020-07-11 DIAGNOSIS — R221 Localized swelling, mass and lump, neck: Secondary | ICD-10-CM | POA: Diagnosis present

## 2020-07-11 DIAGNOSIS — I480 Paroxysmal atrial fibrillation: Secondary | ICD-10-CM | POA: Diagnosis present

## 2020-07-11 DIAGNOSIS — K869 Disease of pancreas, unspecified: Secondary | ICD-10-CM | POA: Diagnosis present

## 2020-07-11 DIAGNOSIS — I214 Non-ST elevation (NSTEMI) myocardial infarction: Secondary | ICD-10-CM | POA: Diagnosis not present

## 2020-07-11 DIAGNOSIS — I252 Old myocardial infarction: Secondary | ICD-10-CM

## 2020-07-11 DIAGNOSIS — G894 Chronic pain syndrome: Secondary | ICD-10-CM | POA: Diagnosis present

## 2020-07-11 DIAGNOSIS — I2582 Chronic total occlusion of coronary artery: Secondary | ICD-10-CM | POA: Diagnosis present

## 2020-07-11 DIAGNOSIS — E119 Type 2 diabetes mellitus without complications: Secondary | ICD-10-CM

## 2020-07-11 DIAGNOSIS — E782 Mixed hyperlipidemia: Secondary | ICD-10-CM | POA: Diagnosis not present

## 2020-07-11 DIAGNOSIS — Z79899 Other long term (current) drug therapy: Secondary | ICD-10-CM

## 2020-07-11 DIAGNOSIS — I5042 Chronic combined systolic (congestive) and diastolic (congestive) heart failure: Secondary | ICD-10-CM | POA: Diagnosis present

## 2020-07-11 DIAGNOSIS — E1142 Type 2 diabetes mellitus with diabetic polyneuropathy: Secondary | ICD-10-CM

## 2020-07-11 DIAGNOSIS — Z7901 Long term (current) use of anticoagulants: Secondary | ICD-10-CM

## 2020-07-11 DIAGNOSIS — Z888 Allergy status to other drugs, medicaments and biological substances status: Secondary | ICD-10-CM

## 2020-07-11 DIAGNOSIS — Z886 Allergy status to analgesic agent status: Secondary | ICD-10-CM

## 2020-07-11 DIAGNOSIS — R110 Nausea: Secondary | ICD-10-CM | POA: Diagnosis present

## 2020-07-11 DIAGNOSIS — J320 Chronic maxillary sinusitis: Secondary | ICD-10-CM | POA: Diagnosis present

## 2020-07-11 DIAGNOSIS — K219 Gastro-esophageal reflux disease without esophagitis: Secondary | ICD-10-CM | POA: Diagnosis present

## 2020-07-11 DIAGNOSIS — E860 Dehydration: Secondary | ICD-10-CM | POA: Diagnosis present

## 2020-07-11 LAB — CBC
HCT: 51.7 % (ref 39.0–52.0)
Hemoglobin: 18.7 g/dL — ABNORMAL HIGH (ref 13.0–17.0)
MCH: 33 pg (ref 26.0–34.0)
MCHC: 36.2 g/dL — ABNORMAL HIGH (ref 30.0–36.0)
MCV: 91.2 fL (ref 80.0–100.0)
Platelets: 200 10*3/uL (ref 150–400)
RBC: 5.67 MIL/uL (ref 4.22–5.81)
RDW: 11.9 % (ref 11.5–15.5)
WBC: 16.6 10*3/uL — ABNORMAL HIGH (ref 4.0–10.5)
nRBC: 0 % (ref 0.0–0.2)

## 2020-07-11 LAB — BASIC METABOLIC PANEL
Anion gap: 17 — ABNORMAL HIGH (ref 5–15)
Anion gap: 12 (ref 5–15)
BUN: 34 mg/dL — ABNORMAL HIGH (ref 8–23)
BUN: 34 mg/dL — ABNORMAL HIGH (ref 8–23)
CO2: 21 mmol/L — ABNORMAL LOW (ref 22–32)
CO2: 27 mmol/L (ref 22–32)
Calcium: 10 mg/dL (ref 8.9–10.3)
Calcium: 9.5 mg/dL (ref 8.9–10.3)
Chloride: 89 mmol/L — ABNORMAL LOW (ref 98–111)
Chloride: 93 mmol/L — ABNORMAL LOW (ref 98–111)
Creatinine, Ser: 1.44 mg/dL — ABNORMAL HIGH (ref 0.61–1.24)
Creatinine, Ser: 1.26 mg/dL — ABNORMAL HIGH (ref 0.61–1.24)
GFR, Estimated: 51 mL/min — ABNORMAL LOW (ref >60)
GFR, Estimated: 60 mL/min — ABNORMAL LOW (ref >60)
Glucose, Bld: 184 mg/dL — ABNORMAL HIGH (ref 70–99)
Glucose, Bld: 161 mg/dL — ABNORMAL HIGH (ref 70–99)
Potassium: 3.5 mmol/L (ref 3.5–5.1)
Potassium: 3.6 mmol/L (ref 3.5–5.1)
Sodium: 127 mmol/L — ABNORMAL LOW (ref 135–145)
Sodium: 132 mmol/L — ABNORMAL LOW (ref 135–145)

## 2020-07-11 LAB — TROPONIN I (HIGH SENSITIVITY)
Troponin I (High Sensitivity): 62 ng/L — ABNORMAL HIGH (ref <18)
Troponin I (High Sensitivity): 100 ng/L (ref <18)
Troponin I (High Sensitivity): 107 ng/L (ref <18)
Troponin I (High Sensitivity): 103 ng/L (ref <18)
Troponin I (High Sensitivity): 89 ng/L — ABNORMAL HIGH (ref <18)

## 2020-07-11 LAB — APTT: aPTT: 34 seconds (ref 24–36)

## 2020-07-11 LAB — POC SARS CORONAVIRUS 2 AG -  ED: SARS Coronavirus 2 Ag: NEGATIVE

## 2020-07-11 LAB — PROTIME-INR
INR: 1.1 (ref 0.8–1.2)
Prothrombin Time: 13.6 seconds (ref 11.4–15.2)

## 2020-07-11 LAB — HEPARIN LEVEL (UNFRACTIONATED): Heparin Unfractionated: <0.1 IU/mL — ABNORMAL LOW (ref 0.30–0.70)

## 2020-07-11 IMAGING — CR DG CHEST 2V
3 series · 3 of 3 positions shown · non-contrast
Comparison: [DATE]

CLINICAL DATA: Chest pain, shortness of breath, anemia

EXAM:
CHEST - 2 VIEW

[chest lat]
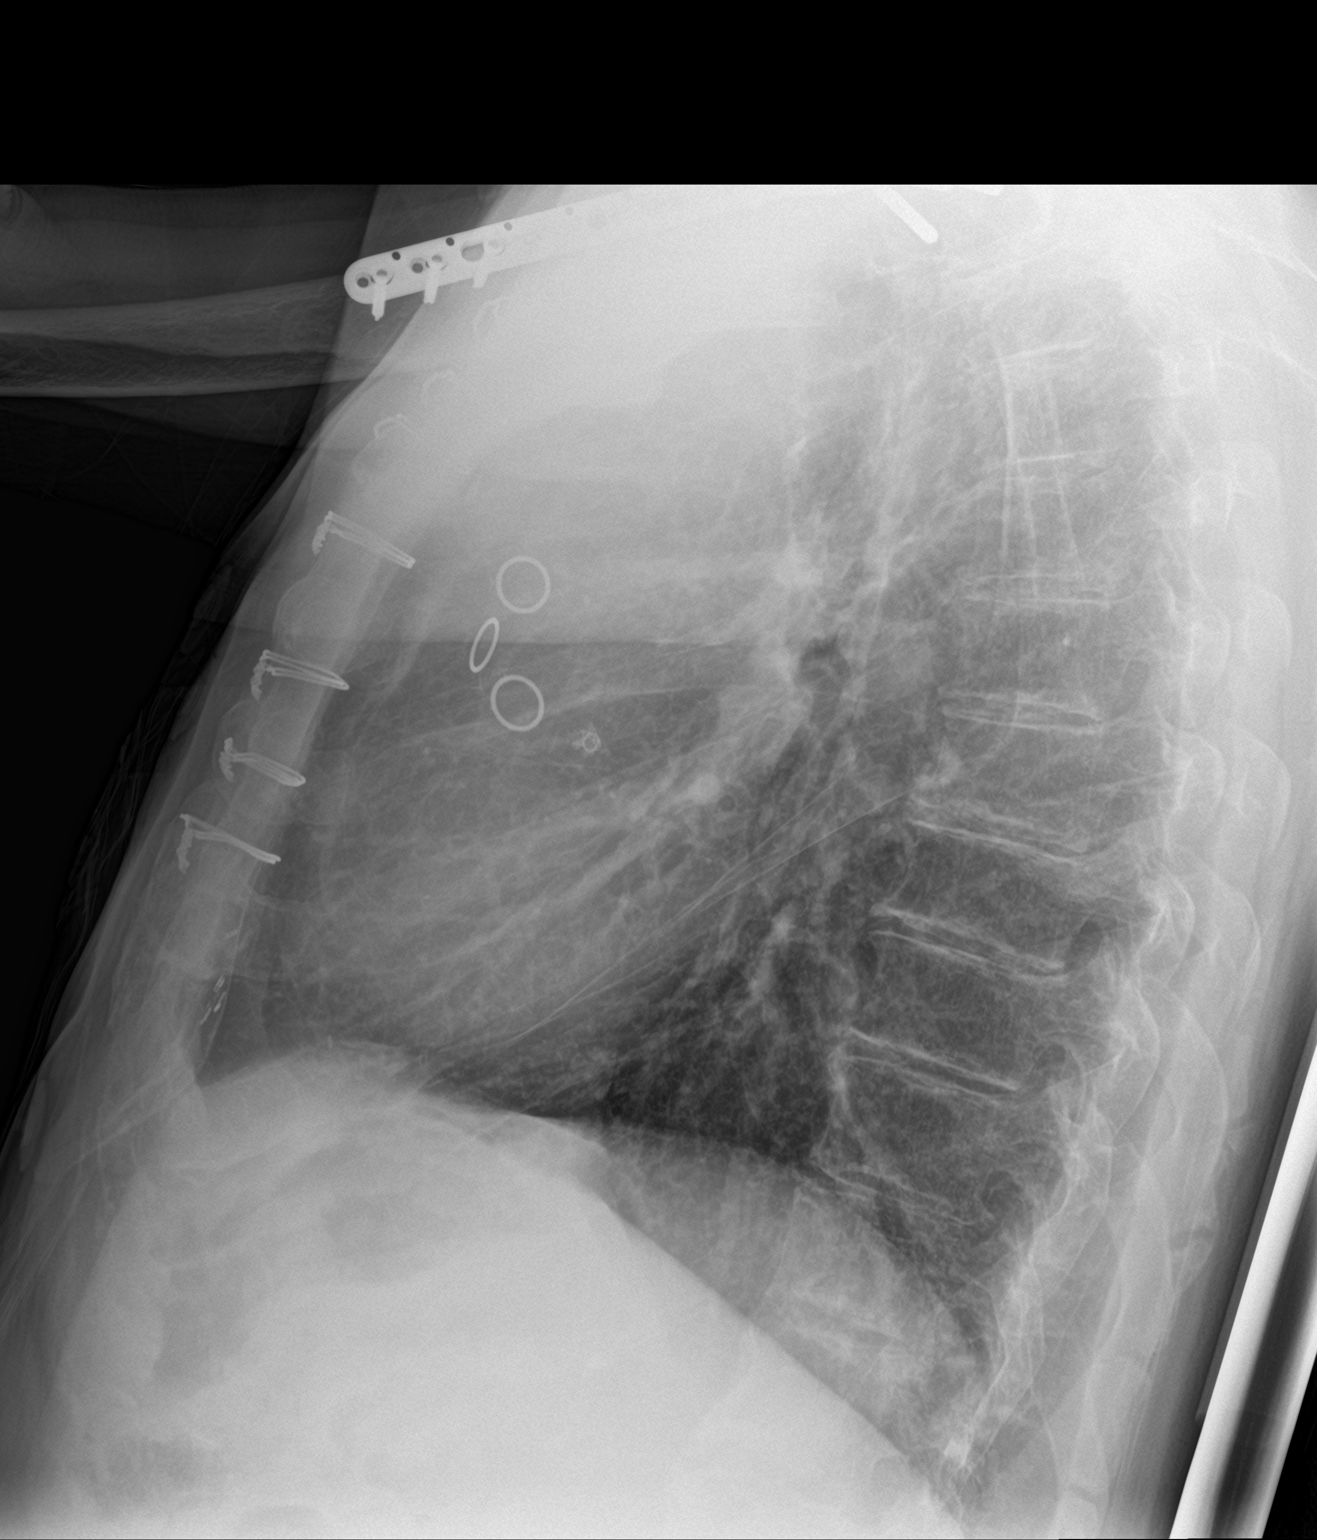

[chest ap (1 of 2)]
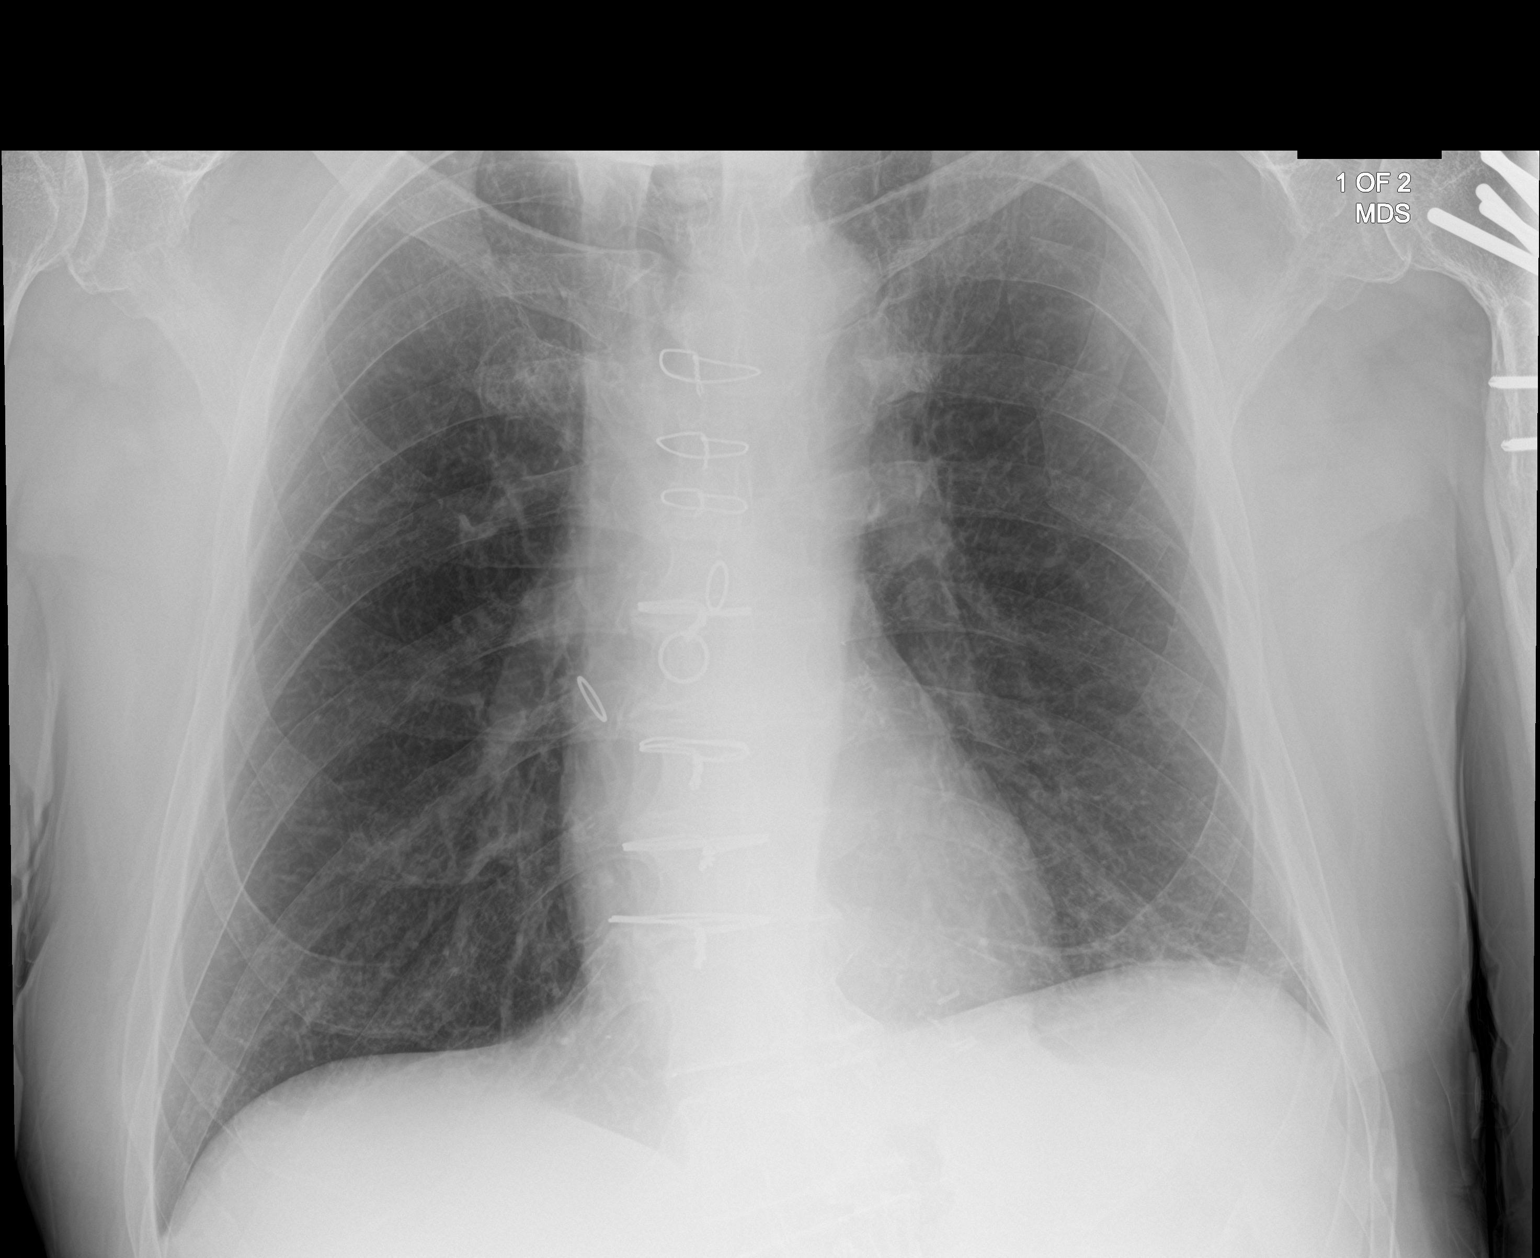

[chest ap (2 of 2)]
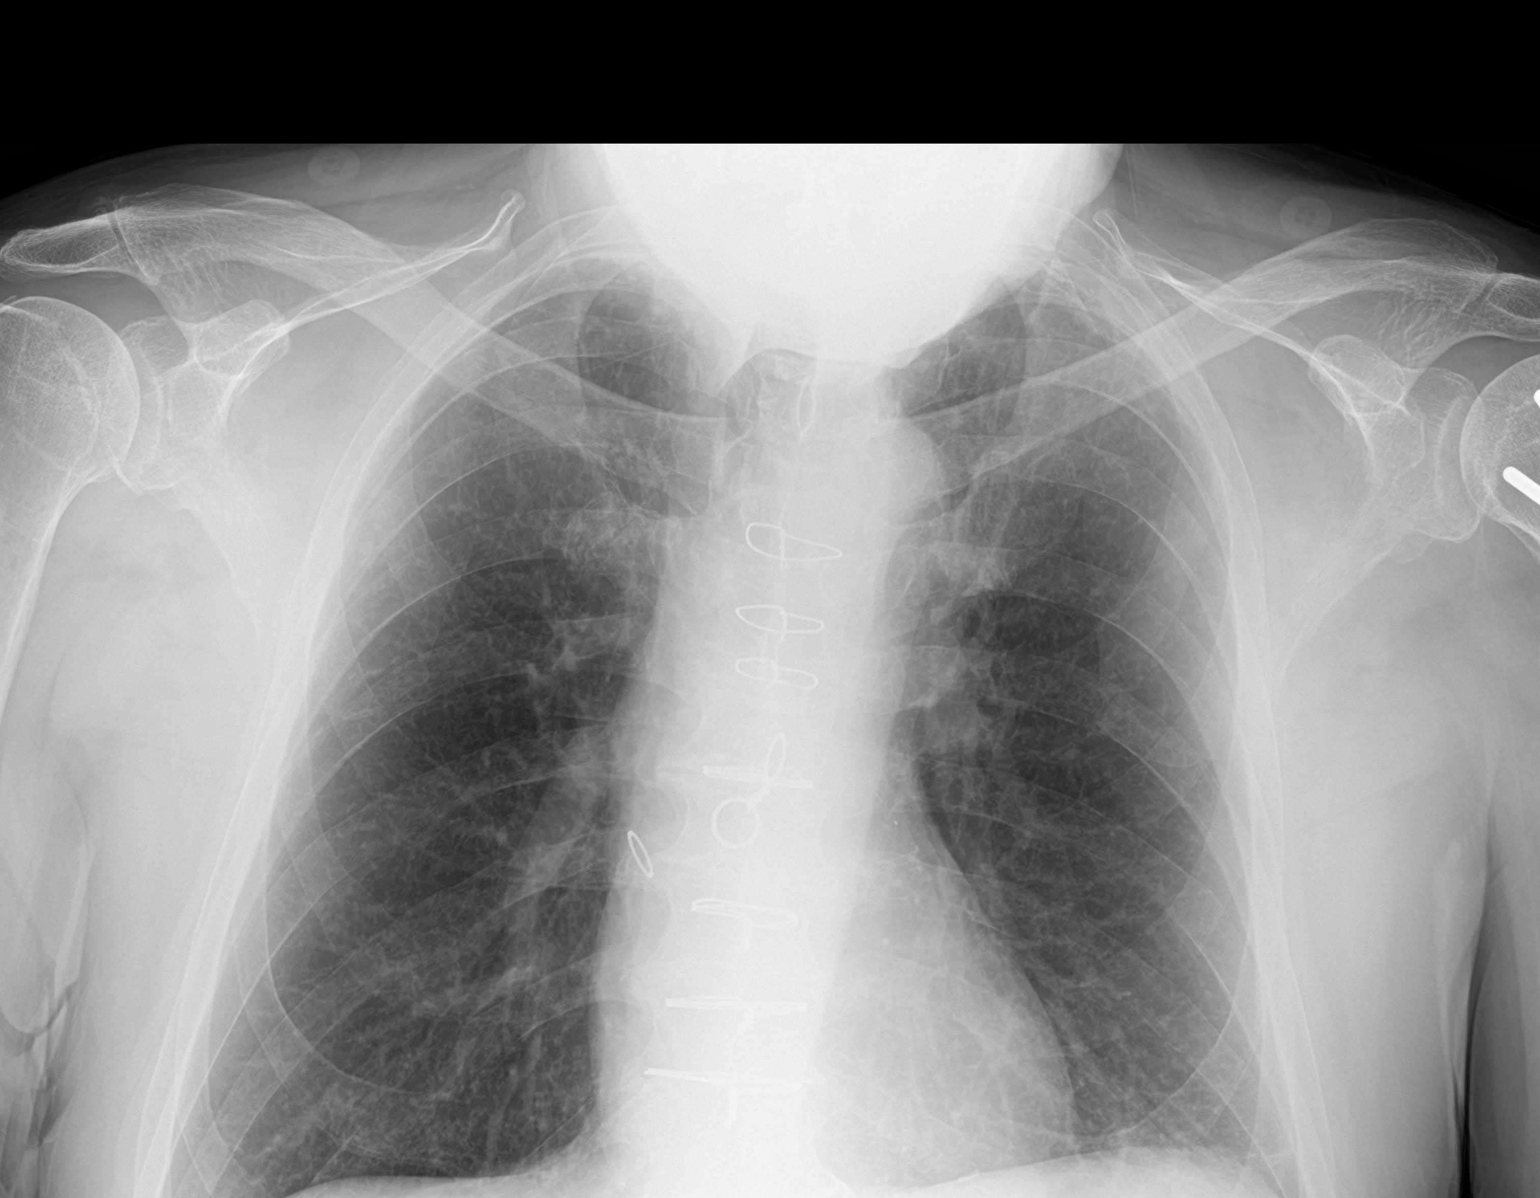

[3 of 3 positions shown; findings below may reference images not displayed]

FINDINGS: Frontal and lateral views of the chest demonstrate postsurgical
changes from previous CABG. Cardiac silhouette is stable. There is
background emphysema, without airspace disease, effusion, or
pneumothorax. No acute bony abnormalities.
IMPRESSION: 1. Emphysema, no acute airspace disease.

## 2020-07-11 MED ORDER — LACTATED RINGERS IV BOLUS
1000.0000 mL | Freq: Once | INTRAVENOUS | Status: AC
  Administered 2020-07-11: 1000 mL via INTRAVENOUS

## 2020-07-11 MED ORDER — MORPHINE SULFATE (PF) 2 MG/ML IV SOLN
2.0000 mg | INTRAVENOUS | Status: DC | PRN
  Administered 2020-07-11 – 2020-07-20 (×27): 2 mg via INTRAVENOUS
  Filled 2020-07-11 (×26): qty 1

## 2020-07-11 MED ORDER — ACETAMINOPHEN 325 MG PO TABS
650.0000 mg | ORAL_TABLET | ORAL | Status: DC | PRN
  Administered 2020-07-15 – 2020-07-17 (×4): 650 mg via ORAL
  Filled 2020-07-11 (×4): qty 2

## 2020-07-11 MED ORDER — MORPHINE SULFATE (PF) 4 MG/ML IV SOLN
4.0000 mg | Freq: Once | INTRAVENOUS | Status: AC
Start: 2020-07-11 — End: 2020-07-11
  Administered 2020-07-11: 4 mg via INTRAVENOUS
  Filled 2020-07-11: qty 1

## 2020-07-11 MED ORDER — NITROGLYCERIN 0.4 MG SL SUBL
0.4000 mg | SUBLINGUAL_TABLET | SUBLINGUAL | Status: DC | PRN
  Administered 2020-07-11 (×2): 0.4 mg via SUBLINGUAL
  Filled 2020-07-11: qty 1

## 2020-07-11 MED ORDER — HEPARIN (PORCINE) 25000 UT/250ML-% IV SOLN
1110.0000 [IU]/h | INTRAVENOUS | Status: DC
  Administered 2020-07-11: 950 [IU]/h via INTRAVENOUS
  Filled 2020-07-11 (×2): qty 250

## 2020-07-11 MED ORDER — SODIUM CHLORIDE 0.9 % IV SOLN: INTRAVENOUS | Status: AC

## 2020-07-11 MED ORDER — ONDANSETRON HCL 4 MG/2ML IJ SOLN
4.0000 mg | Freq: Once | INTRAMUSCULAR | Status: AC
  Administered 2020-07-11: 4 mg via INTRAVENOUS
  Filled 2020-07-11: qty 2

## 2020-07-11 NOTE — ED Notes (Signed)
Pharmacy reached out to this RN and informed me that pt took last xeralto dose at 8:30am heparin will start tomorrow morning. ER provider notified.

## 2020-07-11 NOTE — ED Notes (Signed)
RN messaged provider to ask about how often troponins should be collected.

## 2020-07-11 NOTE — Patient Instructions (Signed)
Moisturize feet once daily; do not apply between toes: A.  Aquaphor Healing Ointment B.  Vaseline Intensive Care Lotion C.  Lubriderm Lotion D.  Gold Bond Diabetic Foot Lotion E.  Eucerin Intensive Repair Moisturizing Lotion  If you have problems reaching your feet:  A.  Aquaphor Advanced Therapy Ointment Body Spray B.  Vaseline Intensive Care Spray Lotion Advanced Repair      Referral will be sent to: [facility]: [place],  [id number]

## 2020-07-11 NOTE — ED Triage Notes (Signed)
Pt reports had blood work done and his MD called hi  And asked if he was having cp or SOB and when he told him yes, he advised him to come to the ED because his hbg was 19

## 2020-07-11 NOTE — ED Notes (Signed)
Pt requesting pain medication. Provider notified and stated she will place an order.

## 2020-07-11 NOTE — H&P (Signed)
H&P       History and Physical    Bryan Scott [id number] DOB: 1945/08/16 DOA: 07/11/2020  PCP: [name], MD  Patient coming from: Home  I have personally briefly reviewed patient's old medical records in [place]  Chief Complaint: Persistent chest pain  HPI: Bryan Scott is a 75 y.o. male with medical history significant for CAD s/p CABG, hypertension, paroxysmal atrial fibrillation on anticoagulation, type 2 diabetes, hyperlipidemia and history of bladder cancer who presents with concerns of persistent chest pain.  Patient reports that starting yesterday he was laying in bed and felt ringing of his ears as well as blurry vision.  He then got up to use the bathroom and felt confused and weak. Denies headache. Also started to develop chest pain associated with shortness of breath that is constant but worse with exertion.  Chest pain is mid-sternal, non-radiating, and sharp.  States it feels similar to his first MI 15 years ago.  Patient normally follows with cardiology at the [facility].  He was last seen here in June 2021 for similar chest pain and had low risk nuclear stress test and was started on Imdur.  He thinks he still taking that now.  He denies other associated fever, cough, runny nose or sinus congestion.  He last took his Xarelto this morning. Reports that he is also being worked up at the [place] for a pancreatic mass since he has been having lower abdominal pain for the past several months.  However he endorsed having good appetite and able to stay hydrated. Has quit alcohol and tobacco use.   ED Course: Patient continued to have chest pain while in the ED which improved with nitro and morphine.  He had mildly elevated blood pressure of 145/75.  Initial troponin of 62 which trended upward to 100.  Noted to have elevated WBC of 16.6 and hemoglobin of 18.7.  Also noted to be hyponatremic with sodium of 127 with AKI of 1.44. Chest x-ray was negative.  EKG without any significant ST  or T wave changes.  ED physician Dr. Charna Archer discussed case with cardiologist Dr. Oval Linsey who recommended patient be admitted and started on heparin infusion.  Review of Systems: Constitutional: No Weight Change, No Fever ENT/Mouth: No sore throat, No Rhinorrhea Eyes: No Eye Pain, +Vision Changes Cardiovascular: + Chest Pain, + SOB, No PND, + Dyspnea on Exertion, No Orthopnea,  No Edema, No Palpitations Respiratory: No Cough, No Sputum, No Wheezing, +Dyspnea  Gastrointestinal: No Nausea, No Vomiting, No Diarrhea, No Constipation, No Pain Genitourinary: no Urinary Incontinence, No Urgency, No Flank Pain Musculoskeletal: No Arthralgias, No Myalgias Skin: No Skin Lesions, No Pruritus, Neuro: + Weakness, + Numbness from neuropathy,  No Loss of Consciousness, No Syncope Psych: No Anxiety/Panic, No Depression, no decrease appetite Heme/Lymph: No Bruising, No Bleeding  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    Angioedema   Cancer (HCC)    Carotid artery disease (HCC)    > 75% bilateral ICA stenoses on 01/2013 CT   Chronic pain syndrome    Coronary artery disease    s/p CABG ~ 2007   GERD (gastroesophageal reflux disease)    Heparin allergy    Bleeding   History of tobacco use    Hyperlipidemia    Hypertension    Ischemic cardiomyopathy    Paroxysmal atrial fibrillation (HCC)    On Xarelto anticoagulation   Type 2 diabetes mellitus (HCC)    A1C  6.8% in 01/2013    Past Surgical History:  Procedure Laterality Date   CARDIAC CATHETERIZATION     CORONARY ARTERY BYPASS GRAFT  2007     reports that he has quit smoking. His smoking use included cigarettes. He has a 30.00 pack-year smoking history. He has never used smokeless tobacco. He reports that he does not drink alcohol and does not use drugs. Social History  Allergies  Allergen Reactions   Aspergum [Aspirin] Anaphylaxis   Heparin Other (See Comments)    Reaction:  Bleeding     Lisinopril Swelling    Family History  Problem Relation Age of Onset   Heart disease Mother 43       Deceased   CVA Mother    Seizures Father 63       Deceased from complications with seizures     Prior to Admission medications   Medication Sig Start Date End Date Taking? Authorizing Provider  amLODipine (NORVASC) 5 MG tablet Take 1 tablet (5 mg total) by mouth daily. Patient taking differently: Take 10 mg by mouth daily.  03/16/15   Gouru, Illene Silver, MD  atorvastatin (LIPITOR) 80 MG tablet Take 80 mg by mouth daily.    [provider]  carvedilol (COREG) 12.5 MG tablet Take 1 tablet (12.5 mg total) by mouth every 12 (twelve) hours. Patient taking differently: Take 25 mg by mouth 2 (two) times daily with a meal.  09/10/19   Fritzi Mandes, MD  chlorthalidone (HYGROTON) 25 MG tablet Take 1 tablet by mouth daily. 02/01/20 02/01/21  [provider]  empagliflozin (JARDIANCE) 25 MG TABS tablet Take 25 mg by mouth daily.    [provider]  ezetimibe (ZETIA) 10 MG tablet Take 1 tablet (10 mg total) by mouth daily. 09/11/19   Fritzi Mandes, MD  gabapentin (NEURONTIN) 100 MG capsule Take by mouth.    [provider]  glipiZIDE (GLUCOTROL) 10 MG tablet Take 10 mg by mouth 2 (two) times daily before a meal.    [provider]  guaiFENesin-dextromethorphan (ROBITUSSIN DM) 100-10 MG/5ML syrup Take 5 mLs by mouth every 4 (four) hours as needed for cough. 07/16/19   Thornell Mule, MD  hydrALAZINE (APRESOLINE) 25 MG tablet Take 25 mg by mouth 3 (three) times daily.    [provider]  isosorbide mononitrate (IMDUR) 30 MG 24 hr tablet Take 1 tablet (30 mg total) by mouth daily. 12/14/19   Mikhail, Velta Addison, DO  metFORMIN (GLUMETZA) 500 MG (MOD) 24 hr tablet Take 500 mg by mouth 2 (two) times daily with a meal.    [provider]  morphine (MS CONTIN) 15 MG 12 hr tablet Take 15 mg by mouth every 12 (twelve) hours as needed for pain.    [provider]  ondansetron (ZOFRAN) 8 MG tablet Take 8 mg by mouth every 8 (eight) hours as needed for nausea or vomiting.     [provider]  rivaroxaban (XARELTO) 20 MG TABS tablet Take 1 tablet (20 mg total) by mouth daily with supper. 12/13/19   Cristal Ford, DO  spironolactone (ALDACTONE) 25 MG tablet Take 1 tablet (25 mg total) by mouth daily. 09/11/19   Fritzi Mandes, MD    Physical Exam: Vitals:   07/11/20 1507 07/11/20 1508 07/11/20 1730  BP: 132/82  (!) 145/75  Pulse: 81  68  Resp: 20  (!) 24  Temp: 98.4 F (36.9 C)    TempSrc: Oral    SpO2: 95%  95%  Weight:  79.4 kg   Height:  6' (1.829 m)     Constitutional: NAD, calm, comfortable, elderly male laying at 40 degree incline in bed Vitals:   07/11/20 1507 07/11/20 1508 07/11/20 1730  BP: 132/82  (!) 145/75  Pulse: 81  68  Resp: 20  (!) 24  Temp: 98.4 F (36.9 C)    TempSrc: Oral    SpO2: 95%  95%  Weight:  79.4 kg   Height:  6' (1.829 m)    Eyes: PERRL, lids and conjunctivae normal ENMT: Mucous membranes are moist. Posterior pharynx clear of any exudate or lesions.normal external auditory canal bilaterally with clear tympanic membranes. Neck: normal, supple,  Respiratory: clear to auscultation bilaterally, no wheezing, no crackles. Normal respiratory effort. No accessory muscle use.  Cardiovascular: Regular rate and rhythm, no murmurs / rubs / gallops. No extremity edema. Abdomen: no tenderness, no masses palpated.  Bowel sounds positive.  Musculoskeletal: no clubbing / cyanosis. No joint deformity upper and lower extremities. Good ROM, no contractures. Normal muscle tone.  Skin: no rashes, lesions, ulcers. No induration Neurologic: CN 2-12 grossly intact. Sensation intact. Strength 5/5 in all 4.  No nystagmus. Psychiatric: Normal judgment and insight. Alert and oriented x 3. Normal mood.     Labs on Admission: I have personally reviewed following labs and imaging studies  CBC: Recent Labs  Lab  07/11/20 1606  WBC 16.6*  HGB 18.7*  HCT 51.7  MCV 91.2  PLT 254   Basic Metabolic Panel: Recent Labs  Lab 07/11/20 1606  NA 127*  K 3.5  CL 89*  CO2 21*  GLUCOSE 184*  BUN 34*  CREATININE 1.44*  CALCIUM 10.0   GFR: Estimated Creatinine Clearance: 49.4 mL/min (A) (by C-G formula based on SCr of 1.44 mg/dL (H)). Liver Function Tests: No results for input(s): AST, ALT, ALKPHOS, BILITOT, PROT, ALBUMIN in the last 168 hours. No results for input(s): LIPASE, AMYLASE in the last 168 hours. No results for input(s): AMMONIA in the last 168 hours. Coagulation Profile: Recent Labs  Lab 07/11/20 1606  INR 1.1   Cardiac Enzymes: No results for input(s): CKTOTAL, CKMB, CKMBINDEX, TROPONINI in the last 168 hours. BNP (last 3 results) No results for input(s): PROBNP in the last 8760 hours. HbA1C: No results for input(s): HGBA1C in the last 72 hours. CBG: No results for input(s): GLUCAP in the last 168 hours. Lipid Profile: No results for input(s): CHOL, HDL, LDLCALC, TRIG, CHOLHDL, LDLDIRECT in the last 72 hours. Thyroid Function Tests: No results for input(s): TSH, T4TOTAL, FREET4, T3FREE, THYROIDAB in the last 72 hours. Anemia Panel: No results for input(s): VITAMINB12, FOLATE, FERRITIN, TIBC, IRON, RETICCTPCT in the last 72 hours. Urine analysis:    Component Value Date/Time   COLORURINE YELLOW (A) 09/09/2019 0946   APPEARANCEUR CLEAR (A) 09/09/2019 0946   APPEARANCEUR Clear 10/15/2013 2200   LABSPEC 1.023 09/09/2019 0946   LABSPEC 1.012 10/15/2013 2200   PHURINE 6.0 09/09/2019 0946   GLUCOSEU >=500 (A) 09/09/2019 0946   GLUCOSEU Negative 10/15/2013 2200   HGBUR NEGATIVE 09/09/2019 0946   BILIRUBINUR NEGATIVE 09/09/2019 0946   BILIRUBINUR Negative 10/15/2013 2200   KETONESUR 5 (A) 09/09/2019 0946   PROTEINUR NEGATIVE 09/09/2019 0946   NITRITE NEGATIVE 09/09/2019 0946   LEUKOCYTESUR NEGATIVE 09/09/2019 0946   LEUKOCYTESUR Negative 10/15/2013 2200    Radiological  Exams on Admission: DG Chest 2 View  Result Date: 07/11/2020 CLINICAL DATA:  Chest pain, shortness of breath, anemia EXAM: CHEST - 2 VIEW COMPARISON:  12/12/2019 FINDINGS: Frontal and lateral views of the chest demonstrate postsurgical changes from previous CABG. Cardiac silhouette is stable. There is background emphysema, without airspace disease, effusion, or pneumothorax. No acute bony abnormalities. IMPRESSION: 1. Emphysema, no acute airspace disease. Electronically Signed   By: Randa Ngo M.D.   On: 07/11/2020 15:52      Assessment/Plan  Chest pain/NSTEMI -Patient with known history of CAD status post CABG -He follows up with cardiology at the [facility] -EKG does not show any acute findings -Initial troponin of 67 -->100. Continue to trend -Nuclear stress test back in June 2021 was low risk -Cardiology consulted by EDP Dr Charna Archer recommended starting heparin infusion. Appreciate recommendations and will need consult in the morning. -Keep NPO after midnight in anticipation of cardiology recommendations  Blurry vision/tinnitus -no focal neurological findings-low suspicion of CVA -Patient had CT head back in March 2021 for dizziness and had paranasal sinus disease which could explain his current symptoms.   -Also he is hyponatremic with elevated hgb/hct showing heme-concentration and AKI today so dehydration could also be another contributing factor. - monitor symptoms with fluids  Hyponatremia Suspect due to hypovolemia.  Will repeat BMP now that he has received 1 L LR in the ED. Will continue 75 cc NS continuous infusion unless repeat BMP shows worsening hyponatremia  AKI Likely prerenal given hyponatremia and hemoconcentrated hemoglobin and hematocrit Continue IV 75 cc NS infusion  Elevated anion gap suspect from hypovolemic follow with repeat BMP after fluids  Leukocytosis Unclear etiology. No signs of infection Follow repeat CBC in the morning after fluids  essential  hypertension -BP minimally elevated  Diabetes mellitus, type II -Last HbA1C of 8 in 04/2020  History of paroxysmal atrial fibrillation -Hold Xareto while on heparin gtt. Last dose morning of 1/26  Hyperlipidemia -Continue statin  Need to follow up on med rec as patient could not recall medications  Status is: Observation  The patient remains OBS appropriate and will d/c before 2 midnights.  Dispo: The patient is from: Home              Anticipated d/c is to: Home              Anticipated d/c date is: 1 day              Patient currently is not medically stable to d/c.   Difficult to place patient No         Orene Desanctis DO [facility]   If 7PM-7AM, please contact night-coverage [email or web address]   07/11/2020, 7:11 PM

## 2020-07-11 NOTE — ED Notes (Signed)
Pharmacy reached out to this RN and stated that heparin will be started tonight and he will place the order, but that no bolus will be given.

## 2020-07-11 NOTE — Progress Notes (Signed)
ANTICOAGULATION CONSULT NOTE - Initial Consult  Pharmacy Consult for Heparin  Indication: chest pain/ACS  Allergies  Allergen Reactions  . 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 Heparin Other (See Comments)    Reaction:  Bleeding   . Lisinopril Swelling    Patient Measurements: Height: 6' (182.9 cm) Weight: 79.4 kg (175 lb) IBW/kg (Calculated) : 77.6 Heparin Dosing Weight:  79.4 kg   Vital Signs: Temp: 98.4 F (36.9 C) (01/26 1507) Temp Source: Oral (01/26 1507) BP: 145/75 (01/26 1730) Pulse Rate: 68 (01/26 1730)  Labs: Recent Labs    07/11/20 1605 07/11/20 1606 07/11/20 1710  HGB  --  18.7*  --   HCT  --  51.7  --   PLT  --  200  --   APTT 34  --   --   LABPROT  --  13.6  --   INR  --  1.1  --   CREATININE  --  1.44*  --   TROPONINIHS  --  62* 100*    Estimated Creatinine Clearance: 49.4 mL/min (A) (by C-G formula based on SCr of 1.44 mg/dL (H)).   Medical History: Past Medical History:  Diagnosis Date  . 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 Cancer ([place])   . Carotid artery disease (HCC)    > 75% bilateral ICA stenoses on 01/2013 CT  . Chronic pain syndrome   . Coronary artery disease    s/p CABG ~ 2007  . GERD (gastroesophageal reflux disease)   . Heparin allergy    Bleeding  . History of tobacco use   . Hyperlipidemia   . Hypertension   . Ischemic cardiomyopathy   . Paroxysmal atrial fibrillation (HCC)    On Xarelto anticoagulation  . Type 2 diabetes mellitus (HCC)    A1C 6.8% in 01/2013    Medications:  (Not in a hospital admission)   Assessment: Pharmacy consulted to dose heparin in this 75 year old male admitted with ACS/NSTEMI.  CrCl = 79.4 ml/min Pt was on Xarelto 20 mg PO daily at home, last dose was on 1/26 @ 0830.   Pt has heparin listed as allergy with "bleeding" listed as reaction.  Pt has had heparin and lovenox on multiple prior admissions without issue.  MD ok to proceed with heparin.  Goal of Therapy:  Heparin level 0.3-0.7  units/ml aPTT 66 - 102  seconds Monitor platelets by anticoagulation protocol: Yes   Plan:  Will order baseline aPTT and HL to assess effect of prior Xarelto dose.   Will start heparin 950 units/hr on 1/26 @ 1900 but no bolus.  Would otherwise wait til 1/27 0800 to start but will begin now to due clinical urgency. Will check CBC daily. Will use aPTT to guide dosing until aPTT and HL correlate.   Wilda Wetherell D 07/11/2020,7:25 PM

## 2020-07-11 NOTE — ED Notes (Signed)
Pt states coming in after talking with his provider with concerns for abnormal blood work, chest pain and shortness of breath. Pt on cardiac, bp and pulse ox monitor. Pt states some pain to the legs as well.

## 2020-07-11 NOTE — ED Notes (Signed)
Providers response to this RNs inquiry about troponin testing: "I would say lets get 2 more trops q2hrs and then can discontinue if the downward trend is established"

## 2020-07-11 NOTE — ED Notes (Signed)
hospitalist at bedside. Pt states that he has some blurred vision.

## 2020-07-11 NOTE — ED Provider Notes (Signed)
[facility] Emergency Department Provider Note   ____________________________________________   Event Date/Time   First MD Initiated Contact with Patient 07/11/20 1703     (approximate)  I have reviewed the triage vital signs and the nursing notes.   HISTORY  Chief Complaint Chest Pain, Shortness of Breath, and Abnormal Lab    HPI Bryan Scott is a 75 y.o. male with past medical history of hypertension, hyperlipidemia, diabetes, atrial fibrillation on Xarelto, and CAD status post CABG who presents to the ED complaining of chest pain.  Patient states that he has been dealing with intermittent sharp pain in the center of his chest for the past couple of days along with some difficulty breathing.  Pain became constant earlier today but does not appear to be exacerbated or alleviated by anything in particular.  He denies any fevers or cough, states he has been feeling nauseous but has not had any vomiting or diarrhea.  He is fully vaccinated against COVID-19.  He was seen at his PCPs office yesterday at the [place], had abnormal lab work there and was told he needs to go to the ER.  He is not sure what lab work was abnormal.        Past Medical History:  Diagnosis Date  . 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 Cancer ([place])   . Carotid artery disease (HCC)    > 75% bilateral ICA stenoses on 01/2013 CT  . Chronic pain syndrome   . Coronary artery disease    s/p CABG ~ 2007  . GERD (gastroesophageal reflux disease)   . Heparin allergy    Bleeding  . History of tobacco use   . Hyperlipidemia   . Hypertension   . Ischemic cardiomyopathy   . Paroxysmal atrial fibrillation (HCC)    On Xarelto anticoagulation  . Type 2 diabetes mellitus ([place])    A1C 6.8% in 01/2013    Patient Active Problem List   Diagnosis Date Noted  . Acute metabolic encephalopathy [id number]  . GERD (gastroesophageal reflux disease) 09/09/2019  . CAD (coronary artery disease)  09/09/2019  . Chronic pain syndrome 09/09/2019  . Leukocytosis 09/09/2019  . Shortness of breath   . Hyponatremia 09/26/2016  . Chest pain 09/22/2016  . 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  . Abnormal EKG 09/21/2016  . Chronic pain 09/21/2016  . PAF (paroxysmal atrial fibrillation) ([place]) 09/21/2016  . Essential hypertension 05/02/2016  . Carotid stenosis 05/02/2016  . CAD in native artery   . Atypical chest pain   . Type 2 diabetes mellitus without complication ([place])   . Hyperlipidemia   . Pain of left upper extremity   . Angina pectoris ([place])   . Abdominal pain   . Chest pain, central 03/13/2015  . Hypertensive urgency 03/13/2015  . Spinal stenosis 01/10/2012    Past Surgical History:  Procedure Laterality Date  . CARDIAC CATHETERIZATION    . CORONARY ARTERY BYPASS GRAFT  2007    Prior to Admission medications   Medication Sig Start Date End Date Taking? Authorizing Provider  amLODipine (NORVASC) 5 MG tablet Take 1 tablet (5 mg total) by mouth daily. Patient taking differently: Take 10 mg by mouth daily.  03/16/15   Gouru, Illene Silver, MD  atorvastatin (LIPITOR) 80 MG tablet Take 80 mg by mouth daily.    [provider]  carvedilol (COREG) 12.5 MG tablet Take 1 tablet (12.5 mg total) by mouth every 12 (twelve) hours. Patient taking  differently: Take 25 mg by mouth 2 (two) times daily with a meal.  09/10/19   Fritzi Mandes, MD  chlorthalidone (HYGROTON) 25 MG tablet Take 1 tablet by mouth daily. 02/01/20 02/01/21  [provider]  empagliflozin (JARDIANCE) 25 MG TABS tablet Take 25 mg by mouth daily.    [provider]  ezetimibe (ZETIA) 10 MG tablet Take 1 tablet (10 mg total) by mouth daily. 09/11/19   Fritzi Mandes, MD  gabapentin (NEURONTIN) 100 MG capsule Take by mouth.    [provider]  glipiZIDE (GLUCOTROL) 10 MG tablet Take 10 mg by mouth 2 (two) times daily before a meal.    [provider]   guaiFENesin-dextromethorphan (ROBITUSSIN DM) 100-10 MG/5ML syrup Take 5 mLs by mouth every 4 (four) hours as needed for cough. 07/16/19   Thornell Mule, MD  hydrALAZINE (APRESOLINE) 25 MG tablet Take 25 mg by mouth 3 (three) times daily.    [provider]  isosorbide mononitrate (IMDUR) 30 MG 24 hr tablet Take 1 tablet (30 mg total) by mouth daily. 12/14/19   Mikhail, Velta Addison, DO  metFORMIN (GLUMETZA) 500 MG (MOD) 24 hr tablet Take 500 mg by mouth 2 (two) times daily with a meal.    [provider]  morphine (MS CONTIN) 15 MG 12 hr tablet Take 15 mg by mouth every 12 (twelve) hours as needed for pain.    [provider]  ondansetron (ZOFRAN) 8 MG tablet Take 8 mg by mouth every 8 (eight) hours as needed for nausea or vomiting.     [provider]  rivaroxaban (XARELTO) 20 MG TABS tablet Take 1 tablet (20 mg total) by mouth daily with supper. 12/13/19   Cristal Ford, DO  spironolactone (ALDACTONE) 25 MG tablet Take 1 tablet (25 mg total) by mouth daily. 09/11/19   Fritzi Mandes, MD    Allergies Aspergum [aspirin], Heparin, and Lisinopril  Family History  Problem Relation Age of Onset  . Heart disease Mother 79       Deceased  . CVA Mother   . Seizures Father 59       Deceased from complications with seizures    Social History Social History   Tobacco Use  . Smoking status: Former Smoker    Packs/day: 1.00    Years: 30.00    Pack years: 30.00    Types: Cigarettes  . Smokeless tobacco: Never Used  . Tobacco comment: 30 pack-year history. Quit 8-9 years ago.   Substance Use Topics  . Alcohol use: No  . Drug use: No    Review of Systems  Constitutional: No fever/chills Eyes: No visual changes. ENT: No sore throat. Cardiovascular: Positive for chest pain. Respiratory: Positive for shortness of breath. Gastrointestinal: No abdominal pain.  Positive for nausea, no vomiting.  No diarrhea.  No constipation. Genitourinary: Negative for  dysuria. Musculoskeletal: Negative for back pain. Skin: Negative for rash. Neurological: Negative for headaches, focal weakness or numbness.  ____________________________________________   PHYSICAL EXAM:  VITAL SIGNS: ED Triage Vitals  Enc Vitals Group     BP 07/11/20 1507 132/82     Pulse Rate 07/11/20 1507 81     Resp 07/11/20 1507 20     Temp 07/11/20 1507 98.4 F (36.9 C)     Temp Source 07/11/20 1507 Oral     SpO2 07/11/20 1507 95 %     Weight 07/11/20 1508 175 lb (79.4 kg)     Height 07/11/20 1508 6' (1.829 m)  Head Circumference --      Peak Flow --      Pain Score --      Pain Loc --      Pain Edu? --      Excl. in [place]? --     Constitutional: Alert and oriented. Eyes: Conjunctivae are normal. Head: Atraumatic. Nose: No congestion/rhinnorhea. Mouth/Throat: Mucous membranes are moist. Neck: Normal ROM Cardiovascular: Normal rate, regular rhythm. Grossly normal heart sounds.  2+ radial pulses bilaterally. Respiratory: Normal respiratory effort.  No retractions. Lungs CTAB.  No chest wall tenderness to palpation. Gastrointestinal: Soft and nontender. No distention. Genitourinary: deferred Musculoskeletal: No lower extremity tenderness nor edema. Neurologic:  Normal speech and language. No gross focal neurologic deficits are appreciated. Skin:  Skin is warm, dry and intact. No rash noted. Psychiatric: Mood and affect are normal. Speech and behavior are normal.  ____________________________________________   LABS (all labs ordered are listed, but only abnormal results are displayed)  Labs Reviewed  BASIC METABOLIC PANEL - Abnormal; Notable for the following components:      Result Value   Sodium 127 (*)    Chloride 89 (*)    CO2 21 (*)    Glucose, Bld 184 (*)    BUN 34 (*)    Creatinine, Ser 1.44 (*)    GFR, Estimated 51 (*)    Anion gap 17 (*)    All other components within normal limits  CBC - Abnormal; Notable for the following components:   WBC  16.6 (*)    Hemoglobin 18.7 (*)    MCHC 36.2 (*)    All other components within normal limits  TROPONIN I (HIGH SENSITIVITY) - Abnormal; Notable for the following components:   Troponin I (High Sensitivity) 62 (*)    All other components within normal limits  TROPONIN I (HIGH SENSITIVITY) - Abnormal; Notable for the following components:   Troponin I (High Sensitivity) 100 (*)    All other components within normal limits  SARS CORONAVIRUS 2 (TAT 6-24 HRS)  PROTIME-INR  HEPARIN LEVEL (UNFRACTIONATED)  APTT  POC SARS CORONAVIRUS 2 AG -  ED   ____________________________________________  EKG  ED ECG REPORT I, Blake Divine, the attending physician, personally viewed and interpreted this ECG.   Date: 07/11/2020  EKG Time: 15:01  Rate: 80  Rhythm: normal sinus rhythm  Axis: Normal  Intervals:Prolonged QT  ST&T Change: None   PROCEDURES  Procedure(s) performed (including Critical Care):  Procedures   ____________________________________________   INITIAL IMPRESSION / ASSESSMENT AND PLAN / ED COURSE       75 year old male with past medical history of hypertension, hyperlipidemia, diabetes, paroxysmal atrial fibrillation on Xarelto, and CAD status post CABG who presents to the ED complaining of intermittent chest pain for the past couple of days, constant today.  He has some ongoing chest pain currently, which we will treat with nitroglycerin however he has severe allergy to aspirin.  EKG shows no ischemic changes, however patient noted to have elevated troponin, which is new for him.  It is mildly elevated and we will repeat to trend, elevation could be related to AKI.  We will hydrate with IV fluids and perform testing for COVID-19.  Chest x-ray reviewed by me and shows no infiltrate, edema, or effusion.  I doubt PE or dissection given reassuring vital signs and intact pulses.  Repeat troponin increased to 100, patient stating that chest pain is improved but still present  and we will give additional dose of nitroglycerin.  Case discussed with Dr. Oval Linsey of cardiology, who recommends starting patient on heparin.  Case discussed with hospitalist for admission.      ____________________________________________   FINAL CLINICAL IMPRESSION(S) / ED DIAGNOSES  Final diagnoses:  NSTEMI (non-ST elevated myocardial infarction) ([place])  Hyponatremia     ED Discharge Orders    None       Note:  This document was prepared using Dragon voice recognition software and may include unintentional dictation errors.   Blake Divine, MD 07/11/20 [phone]

## 2020-07-11 NOTE — ED Notes (Signed)
Pt declined any further nitroglycerin. ER provider notified.

## 2020-07-11 NOTE — ED Notes (Signed)
Date and time results received: 07/11/20 1757 (use smartphrase ".now" to insert current time)  Test: Troponin Critical Value: 100  Name of Provider Notified: Dr. Charna Archer  Orders Received? Or Actions Taken?: provider notified

## 2020-07-12 ENCOUNTER — Encounter: Payer: Self-pay | Admitting: Family Medicine

## 2020-07-12 ENCOUNTER — Observation Stay: Payer: Medicare PPO

## 2020-07-12 DIAGNOSIS — I25110 Atherosclerotic heart disease of native coronary artery with unstable angina pectoris: Secondary | ICD-10-CM

## 2020-07-12 DIAGNOSIS — I214 Non-ST elevation (NSTEMI) myocardial infarction: Secondary | ICD-10-CM | POA: Diagnosis not present

## 2020-07-12 DIAGNOSIS — I10 Essential (primary) hypertension: Secondary | ICD-10-CM | POA: Diagnosis not present

## 2020-07-12 DIAGNOSIS — I480 Paroxysmal atrial fibrillation: Secondary | ICD-10-CM

## 2020-07-12 DIAGNOSIS — E119 Type 2 diabetes mellitus without complications: Secondary | ICD-10-CM

## 2020-07-12 DIAGNOSIS — E782 Mixed hyperlipidemia: Secondary | ICD-10-CM

## 2020-07-12 DIAGNOSIS — N179 Acute kidney failure, unspecified: Secondary | ICD-10-CM | POA: Diagnosis not present

## 2020-07-12 DIAGNOSIS — D72829 Elevated white blood cell count, unspecified: Secondary | ICD-10-CM

## 2020-07-12 LAB — HEPARIN LEVEL (UNFRACTIONATED)
Heparin Unfractionated: 0.28 IU/mL — ABNORMAL LOW (ref 0.30–0.70)
Heparin Unfractionated: 0.35 IU/mL (ref 0.30–0.70)

## 2020-07-12 LAB — CBC
HCT: 45.6 % (ref 39.0–52.0)
Hemoglobin: 16.6 g/dL (ref 13.0–17.0)
MCH: 33.2 pg (ref 26.0–34.0)
MCHC: 36.4 g/dL — ABNORMAL HIGH (ref 30.0–36.0)
MCV: 91.2 fL (ref 80.0–100.0)
Platelets: 172 10*3/uL (ref 150–400)
RBC: 5 MIL/uL (ref 4.22–5.81)
RDW: 11.9 % (ref 11.5–15.5)
WBC: 14.9 10*3/uL — ABNORMAL HIGH (ref 4.0–10.5)
nRBC: 0 % (ref 0.0–0.2)

## 2020-07-12 LAB — SARS CORONAVIRUS 2 (TAT 6-24 HRS): SARS Coronavirus 2: NEGATIVE

## 2020-07-12 LAB — TROPONIN I (HIGH SENSITIVITY): Troponin I (High Sensitivity): 89 ng/L — ABNORMAL HIGH (ref <18)

## 2020-07-12 LAB — APTT: aPTT: 84 seconds — ABNORMAL HIGH (ref 24–36)

## 2020-07-12 IMAGING — MR MR HEAD WO/W CM
15 series · 48 of 48 positions shown · IV contrast (gadavist)
Comparison: [DATE]

CLINICAL DATA: Dizziness.  Diplopia.  Visual disturbance.

EXAM:
MRI HEAD WITHOUT AND WITH CONTRAST
TECHNIQUE: Multiplanar, multiecho pulse sequences of the brain and surrounding
structures were obtained without and with intravenous contrast.
CONTRAST:  8mL GADAVIST GADOBUTROL 1 MMOL/ML IV SOLN

[Series 5: ax dwi_tracew · axial · 3.0mm · 0.60mm/px · z∈[-48,+107]mm · 3 of 48 slices shown]
[im 1/48]
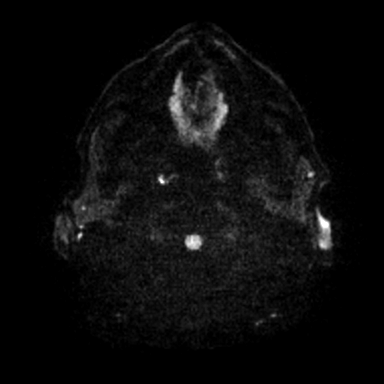
[im 24/48]
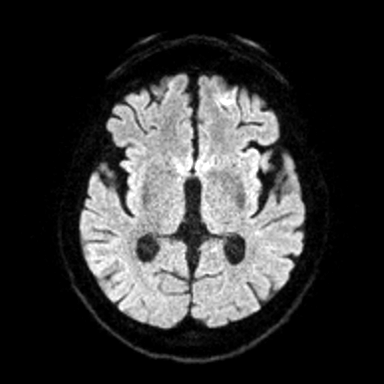
[im 48/48]
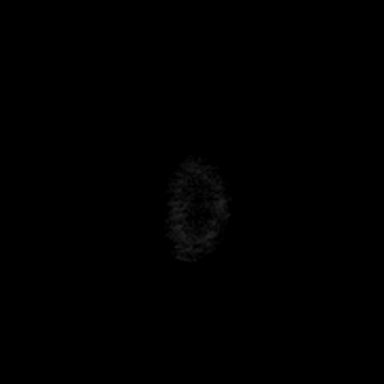

[Series 6: ax dwi_adc · axial · 3.0mm · 0.60mm/px · z∈[-48,+107]mm · 3 of 48 slices shown]
[im 1/48]
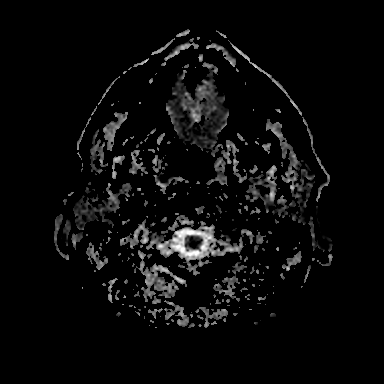
[im 24/48]
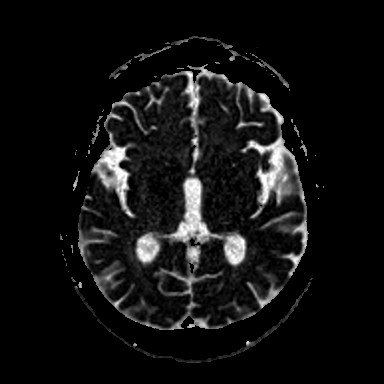
[im 48/48]
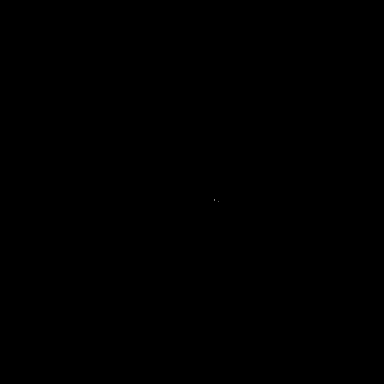

[Series 7: cor dwi_tracew · coronal · 5.0mm · 0.60mm/px · 2 of 36 slices shown]
[im 1/36]
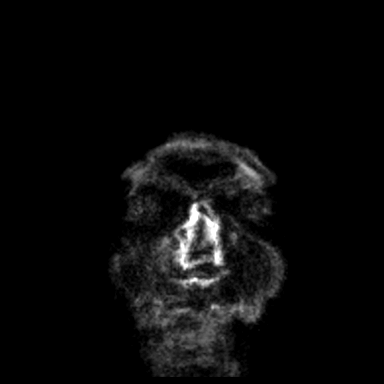
[im 36/36]
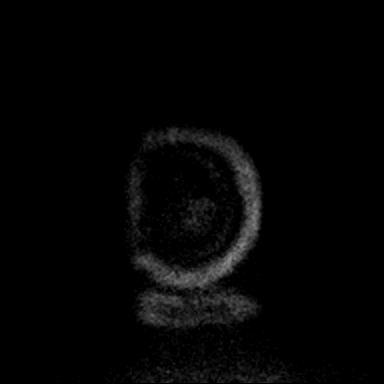

[Series 8: cor dwi_adc · coronal · 5.0mm · 0.60mm/px · 2 of 35 slices shown]
[im 1/35]
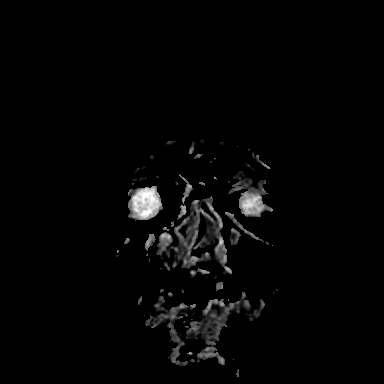
[im 35/35]
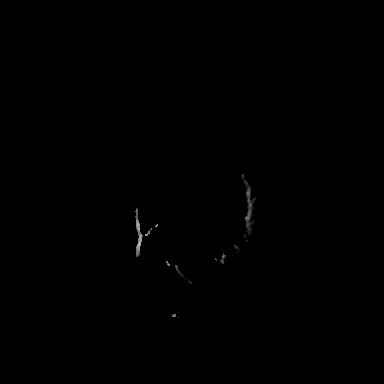

[Series 9: T1 · sagittal · 5.0mm · 0.62mm/px · 1 of 23 slices shown (1 of 2)]
[im 1/23]
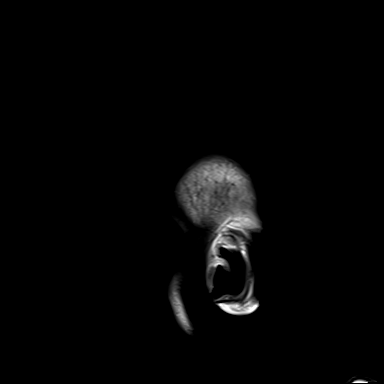

[Series 10: T2 · axial · 5.0mm · 0.53mm/px · 1 of 25 slices shown (1 of 2)]
[im 1/25]
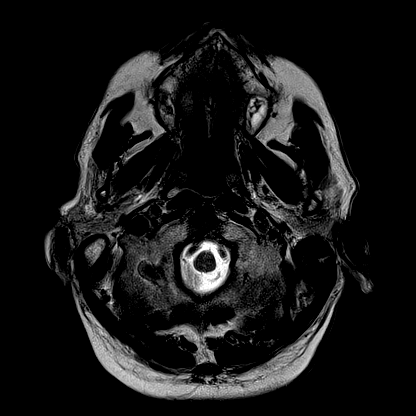

[Series 11: mag_images · axial · 3.0mm · 0.90mm/px · z∈[-60,+117]mm · 3 of 60 slices shown]
[im 1/60]
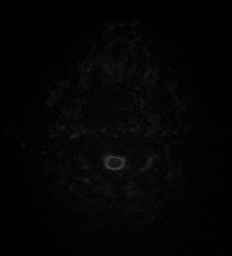
[im 30/60]
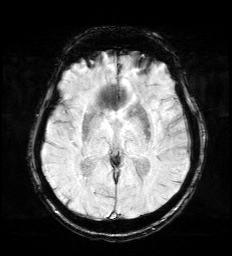
[im 60/60]
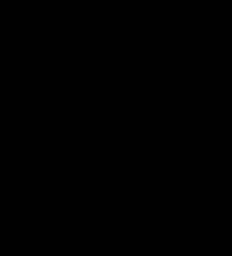

[Series 12: pha_images · axial · 3.0mm · 0.90mm/px · z∈[-60,+117]mm · 3 of 58 slices shown]
[im 1/58]
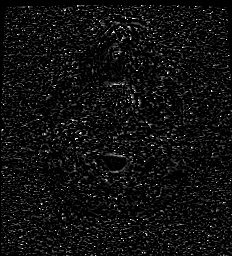
[im 29/58]
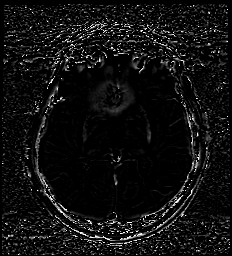
[im 58/58]
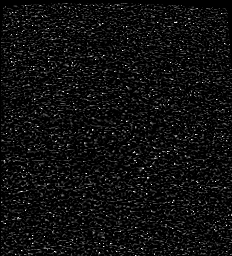

[Series 13: swi_images · axial · 3.0mm · 0.90mm/px · z∈[-60,+117]mm · 3 of 60 slices shown]
[im 1/60]
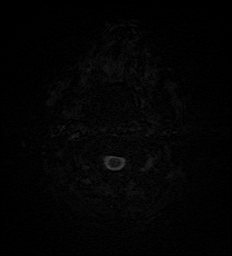
[im 30/60]
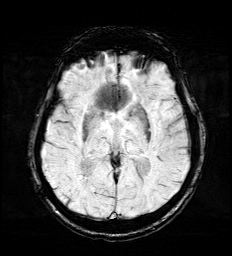
[im 60/60]
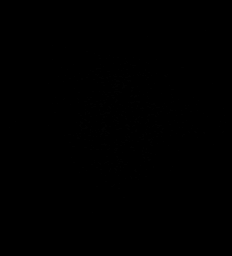

[Series 14: mip_images(sw) · axial · 24.0mm · 0.90mm/px · z∈[-49,+106]mm · 3 of 53 slices shown]
[im 1/53]
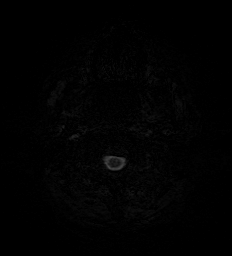
[im 27/53]
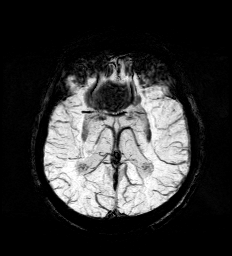
[im 53/53]
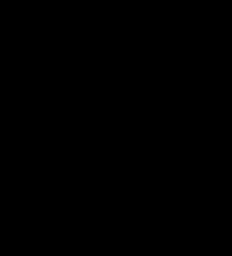

[Series 15: FLAIR · axial · 3.0mm · 0.53mm/px · z∈[-52,+110]mm · 3 of 55 slices shown]
[im 1/55]
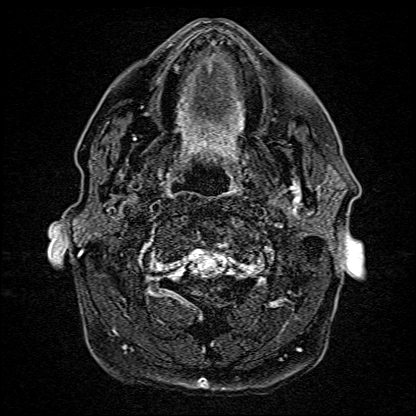
[im 28/55]
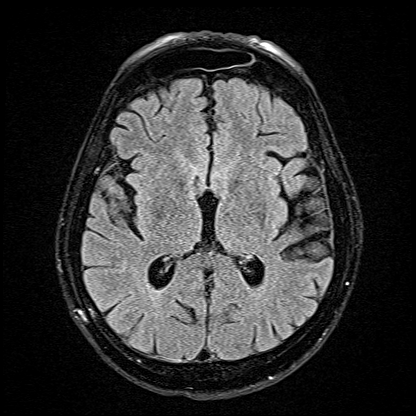
[im 55/55]
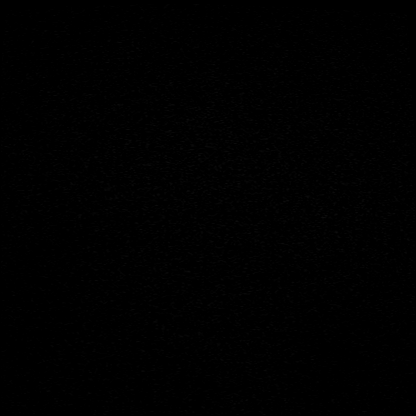

[Series 16: T1 · axial · 1.0mm · 0.98mm/px · z∈[-50,+109]mm · 9 of 160 slices shown (2 of 2)]
[im 1/160]
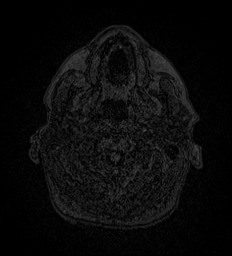
[im 20/160]
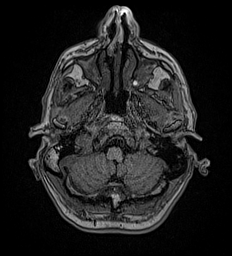
[im 40/160]
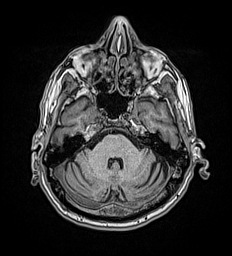
[im 60/160]
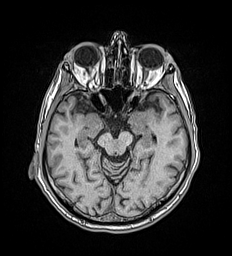
[im 80/160]
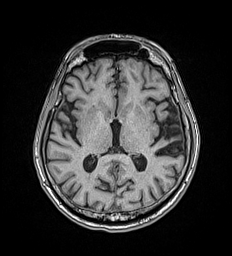
[im 100/160]
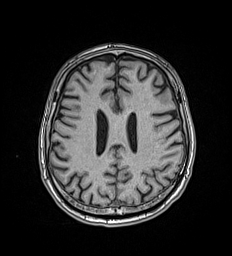
[im 120/160]
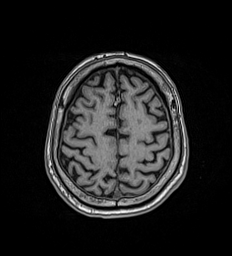
[im 140/160]
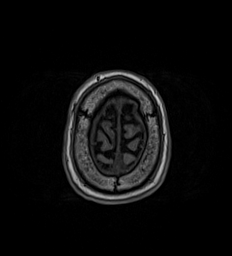
[im 160/160]
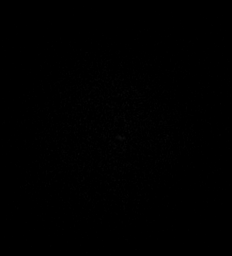

[Series 17: T2 · coronal · 5.0mm · 0.57mm/px · 1 of 27 slices shown (2 of 2)]
[im 1/27]
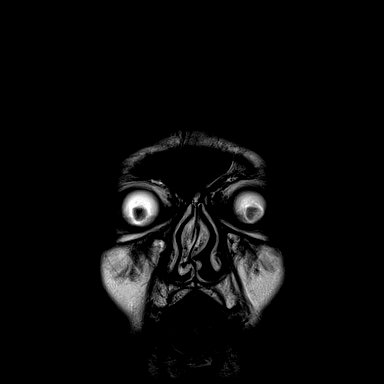

[Series 18: T1 post-contrast · axial · 1.0mm · 0.98mm/px · z∈[-58,+117]mm · 9 of 175 slices shown (1 of 2)]
[im 1/175]
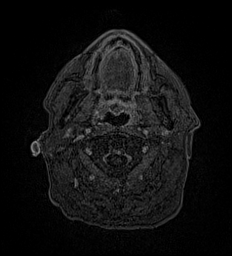
[im 22/175]
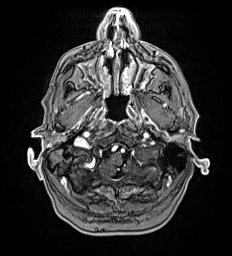
[im 44/175]
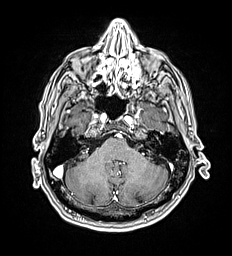
[im 66/175]
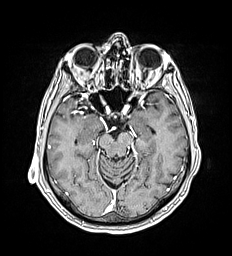
[im 88/175]
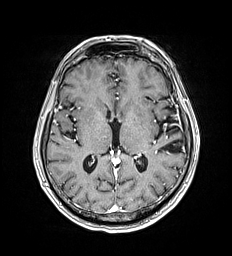
[im 109/175]
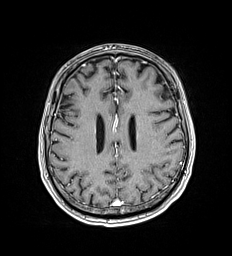
[im 131/175]
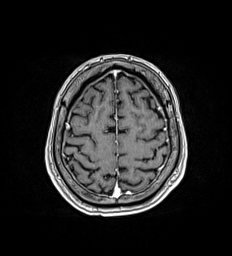
[im 153/175]
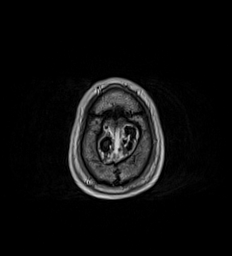
[im 175/175]
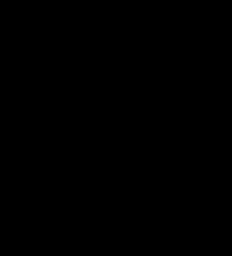

[Series 19: T1 post-contrast · coronal · 5.0mm · 0.57mm/px · 2 of 29 slices shown (2 of 2)]
[im 1/29]
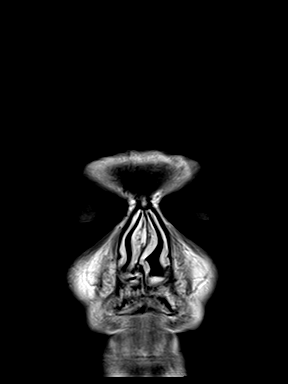
[im 29/29]
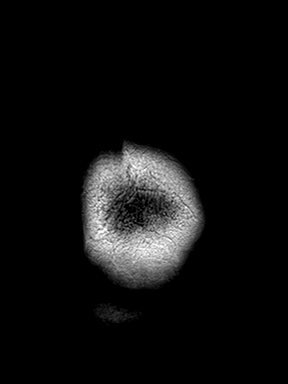

[48 of 48 positions shown; findings below may reference images not displayed]

FINDINGS: Brain: The brain has a normal appearance without evidence of
malformation, atrophy, old or acute small or large vessel
infarction, mass lesion, hemorrhage, hydrocephalus or extra-axial
collection.

Vascular: Major vessels at the base of the brain show flow. Venous
sinuses appear patent.

Skull and upper cervical spine: Normal.

Sinuses/Orbits: Chronic complete opacification of both maxillary
sinuses. Small left mastoid effusion. Orbits appear normal.

Other: None significant.
IMPRESSION: 1. Normal appearance of the brain itself. No cause of the presenting
symptoms is identified.
2. Chronic complete opacification of both maxillary sinuses. Small
left mastoid effusion.

## 2020-07-12 MED ORDER — SODIUM CHLORIDE 0.9 % WEIGHT BASED INFUSION: 1.0000 mL/kg/h | INTRAVENOUS | Status: DC

## 2020-07-12 MED ORDER — SODIUM CHLORIDE 0.9 % WEIGHT BASED INFUSION
3.0000 mL/kg/h | INTRAVENOUS | Status: DC
  Administered 2020-07-13: 3 mL/kg/h via INTRAVENOUS

## 2020-07-12 MED ORDER — HEPARIN BOLUS VIA INFUSION
1200.0000 [IU] | Freq: Once | INTRAVENOUS | Status: AC
  Administered 2020-07-12: 1200 [IU] via INTRAVENOUS
  Filled 2020-07-12: qty 1200

## 2020-07-12 MED ORDER — GADOBUTROL 1 MMOL/ML IV SOLN
8.0000 mL | Freq: Once | INTRAVENOUS | Status: AC | PRN
  Administered 2020-07-12: 8 mL via INTRAVENOUS
  Filled 2020-07-12: qty 8

## 2020-07-12 MED ORDER — ZOLPIDEM TARTRATE 5 MG PO TABS
5.0000 mg | ORAL_TABLET | Freq: Every evening | ORAL | Status: DC | PRN
  Administered 2020-07-12 – 2020-07-18 (×5): 5 mg via ORAL
  Filled 2020-07-12 (×5): qty 1

## 2020-07-12 MED ORDER — ASPIRIN 81 MG PO CHEW
81.0000 mg | CHEWABLE_TABLET | ORAL | Status: DC
  Filled 2020-07-12: qty 1

## 2020-07-12 MED ORDER — ONDANSETRON HCL 4 MG/2ML IJ SOLN
4.0000 mg | Freq: Once | INTRAMUSCULAR | Status: AC
  Administered 2020-07-12: 4 mg via INTRAVENOUS
  Filled 2020-07-12: qty 2

## 2020-07-12 MED ORDER — ATORVASTATIN CALCIUM 80 MG PO TABS
80.0000 mg | ORAL_TABLET | Freq: Every day | ORAL | Status: DC
  Administered 2020-07-12 – 2020-07-21 (×9): 80 mg via ORAL
  Filled 2020-07-12 (×9): qty 1
  Filled 2020-07-12: qty 4

## 2020-07-12 NOTE — ED Notes (Signed)
Introduced self to pt. Pt given urinal and able to stand at bedside. Given pain meds for chest pain.

## 2020-07-12 NOTE — Progress Notes (Signed)
ANTICOAGULATION CONSULT NOTE - Initial Consult  Pharmacy Consult for Heparin  Indication: chest pain/ACS  Allergies  Allergen Reactions  . 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 Heparin Other (See Comments)    Reaction:  Bleeding   . Lisinopril Swelling    Patient Measurements: Height: 6' (182.9 cm) Weight: 79.4 kg (175 lb) IBW/kg (Calculated) : 77.6 Heparin Dosing Weight:  79.4 kg   Vital Signs: BP: 118/73 (01/27 0700) Pulse Rate: 62 (01/27 0715)  Labs: Recent Labs    07/11/20 1605 07/11/20 1606 07/11/20 1710 07/11/20 1829 07/11/20 1843 07/11/20 2037 07/11/20 2238 07/12/20 0052 07/12/20 0308 07/12/20 0914  HGB  --  18.7*  --   --   --   --   --   --  16.6  --   HCT  --  51.7  --   --   --   --   --   --  45.6  --   PLT  --  200  --   --   --   --   --   --  172  --   APTT 34  --   --   --   --   --   --   --   --  84*  LABPROT  --  13.6  --   --   --   --   --   --   --   --   INR  --  1.1  --   --   --   --   --   --   --   --   HEPARINUNFRC  --   --   --  <0.10*  --   --   --   --   --  0.28*  CREATININE  --  1.44*  --   --  1.26*  --   --   --   --   --   TROPONINIHS  --  62*   < >  --  107* 103* 89* 89*  --   --    < > = values in this interval not displayed.    Estimated Creatinine Clearance: 56.5 mL/min (A) (by C-G formula based on SCr of 1.26 mg/dL (H)).   Medical History: Past Medical History:  Diagnosis Date  . 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 Cancer ([place])   . Carotid artery disease (HCC)    > 75% bilateral ICA stenoses on 01/2013 CT  . Chronic pain syndrome   . Coronary artery disease    s/p CABG ~ 2007  . GERD (gastroesophageal reflux disease)   . Heparin allergy    Bleeding  . History of tobacco use   . Hyperlipidemia   . Hypertension   . Ischemic cardiomyopathy   . Paroxysmal atrial fibrillation (HCC)    On Xarelto anticoagulation  . Type 2 diabetes mellitus (HCC)    A1C 6.8% in 01/2013    Medications:  Heparin Dosing  Weight:  79.4 kg  (Not in a hospital admission)   Assessment: Pharmacy consulted to dose heparin in this 75 year old male admitted with ACS/NSTEMI.  CrCl = 79.4 ml/min Pt was on Xarelto 20 mg PO daily at home, last dose was on 1/26 @ 0830.   Pt has heparin listed as allergy with "bleeding" listed as reaction.  Pt has had heparin and lovenox on multiple prior admissions without issue.  MD ok to proceed with heparin.  Date  Time HL/aPTT Rate/Comment 1/26 1829 <0.1 / 34s Started 950 units/hr w/o bolus 1/27 0914 0.28 / 84s APTT and HL correlating (xarelto PTA)    Goal of Therapy:  Heparin level 0.3-0.7 units/ml aPTT 66 - 102  seconds Monitor platelets by anticoagulation protocol: Yes   Plan:  Despite Xarelto last dose 1/26 0830, pt's aPTT and anti-Xa have begun correlating and were normal at baseline. Will continue to dose heparin gtt per HL alone.  Will bolus 1200 units x1; f/b inc'd infusion rate of 1110 units/hr (~2u/k/h inc) Check CBC daily.  Shanon Brow Zakkiyya Barno 07/12/2020,9:57 AM

## 2020-07-12 NOTE — Progress Notes (Signed)
[facility] [place] NAME: Bryan Scott    MR#:  [id number]  DATE OF BIRTH:  [birth year]  SUBJECTIVE:  CHIEF COMPLAINT:   Chief Complaint  Patient presents with  . Chest Pain  . Shortness of Breath  . Abnormal Lab  still having ongoing chest pain, reports intermittent blurry vision and is concerned about both symptoms  REVIEW OF SYSTEMS:  Review of Systems  Constitutional: Negative for diaphoresis, fever, malaise/fatigue and weight loss.  HENT: Negative for ear discharge, ear pain, hearing loss, nosebleeds, sore throat and tinnitus.   Eyes: Positive for blurred vision. Negative for pain.  Respiratory: Positive for shortness of breath. Negative for cough, hemoptysis and wheezing.   Cardiovascular: Positive for chest pain. Negative for palpitations, orthopnea and leg swelling.  Gastrointestinal: Negative for abdominal pain, blood in stool, constipation, diarrhea, heartburn, nausea and vomiting.  Genitourinary: Negative for dysuria, frequency and urgency.  Musculoskeletal: Negative for back pain and myalgias.  Skin: Negative for itching and rash.  Neurological: Negative for dizziness, tingling, tremors, focal weakness, seizures, weakness and headaches.  Psychiatric/Behavioral: Negative for depression. The patient is not nervous/anxious.    DRUG ALLERGIES:   Allergies  Allergen Reactions  . 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 Heparin Other (See Comments)    Reaction:  Bleeding   . Lisinopril Swelling   VITALS:  Blood pressure 126/88, pulse 67, temperature 97.6 F (36.4 C), temperature source Oral, resp. rate 19, height 6' (1.829 m), weight 79.4 kg, SpO2 97 %. PHYSICAL EXAMINATION:  Physical Exam HENT:     Head: Normocephalic and atraumatic.  Eyes:     Extraocular Movements: EOM normal.     Conjunctiva/sclera: Conjunctivae normal.     Pupils: Pupils are equal, round, and reactive to light.  Neck:     Thyroid: No thyromegaly.     Trachea: No tracheal  deviation.  Cardiovascular:     Rate and Rhythm: Normal rate and regular rhythm.     Heart sounds: Normal heart sounds.  Pulmonary:     Effort: Pulmonary effort is normal. No respiratory distress.     Breath sounds: Normal breath sounds. No wheezing.  Chest:     Chest wall: No tenderness.  Abdominal:     General: Bowel sounds are normal. There is no distension.     Palpations: Abdomen is soft.     Tenderness: There is no abdominal tenderness.  Musculoskeletal:        General: Normal range of motion.     Cervical back: Normal range of motion and neck supple.  Skin:    General: Skin is warm and dry.     Findings: No rash.  Neurological:     Mental Status: He is alert and oriented to person, place, and time.     Cranial Nerves: No cranial nerve deficit.    LABORATORY PANEL:  Male CBC Recent Labs  Lab 07/12/20 0308  WBC 14.9*  HGB 16.6  HCT 45.6  PLT 172   ------------------------------------------------------------------------------------------------------------------ Chemistries  Recent Labs  Lab 07/11/20 1843  NA 132*  K 3.6  CL 93*  CO2 27  GLUCOSE 161*  BUN 34*  CREATININE 1.26*  CALCIUM 9.5   RADIOLOGY:  MR BRAIN W WO CONTRAST  Result Date: 07/12/2020 CLINICAL DATA:  Dizziness.  Diplopia.  Visual disturbance. EXAM: MRI HEAD WITHOUT AND WITH CONTRAST TECHNIQUE: Multiplanar, multiecho pulse sequences of the brain and surrounding structures were obtained without and with intravenous contrast. CONTRAST:  13mL GADAVIST GADOBUTROL 1  MMOL/ML IV SOLN COMPARISON:  09/09/2019 FINDINGS: Brain: The brain has a normal appearance without evidence of malformation, atrophy, old or acute small or large vessel infarction, mass lesion, hemorrhage, hydrocephalus or extra-axial collection. Vascular: Major vessels at the base of the brain show flow. Venous sinuses appear patent. Skull and upper cervical spine: Normal. Sinuses/Orbits: Chronic complete opacification of both maxillary  sinuses. Small left mastoid effusion. Orbits appear normal. Other: None significant. IMPRESSION: 1. Normal appearance of the brain itself. No cause of the presenting symptoms is identified. 2. Chronic complete opacification of both maxillary sinuses. Small left mastoid effusion. Electronically Signed   By: Nelson Chimes M.D.   On: 07/12/2020 12:20   ASSESSMENT AND PLAN:  75 y.o. male with medical history significant for CAD s/p CABG, hypertension, paroxysmal atrial fibrillation on anticoagulation, type 2 diabetes, hyperlipidemia and history of bladder cancer admitted for persistent chest pain and some blurred vision.  Chest pain/NSTEMI -Patient with known history of CAD status post CABG -He follows up with cardiology at the [facility] -EKG does not show any acute findings -Initial troponin of 67 -->107->89.  -Nuclear stress test back in June 2021 was low risk -Cardiology planning cath tomorrow  Blurry vision/tinnitus -no focal neurological findings-low suspicion of CVA -Patient had CT head back in March 2021 for dizziness and had paranasal sinus disease which could explain his current symptoms.   -MRI brain neg - monitor symptoms - outpt f/up with ENT/Ophthal  Hyponatremia Suspect due to hypovolemia. Na 132   AKI Likely prerenal given hyponatremia and hemoconcentrated hemoglobin and hematocrit Continue IV 75 cc NS infusion  Elevated anion gap suspect from hypovolemia follow with repeat BMP after fluids  Leukocytosis Unclear etiology. No signs of infection Follow repeat CBC in the morning after fluids  essential hypertension -BP minimally elevated  Diabetes mellitus, type II -Last HbA1C of 8 in 04/2020  History of paroxysmal atrial fibrillation -Hold Xareto while on heparin gtt. Last dose morning of 1/26  Hyperlipidemia -Continue statin  Pancreatic mass F/up at [facility] - Work up in progress   Body mass index is 23.73 kg/m.  Net IO Since Admission: -300 mL [07/12/20  1734]      Status is: Observation  The patient remains OBS appropriate and will d/c before 2 midnights.  Dispo: The patient is from: Home              Anticipated d/c is to: Home              Anticipated d/c date is: 1 day              Patient currently is not medically stable to d/c.   Difficult to place patient No   DVT prophylaxis:       SCDs Start: 07/11/20 1842 heparin drip started 1/26    Family Communication: ("discussed with patient")   All the records are reviewed and case discussed with Care Management/Social Worker. Management plans discussed with the patient, family and they are in agreement.  CODE STATUS: Full Code Level of care: Progressive Cardiac  TOTAL TIME TAKING CARE OF THIS PATIENT: 35 minutes.   More than 50% of the time was spent in counseling/coordination of care: YES  POSSIBLE D/C IN 1-2 DAYS, DEPENDING ON CLINICAL CONDITION.   Max Sane M.D on 07/12/2020 at 5:34 PM  [facility]   CC: Primary care physician; [name], MD  Note: This dictation was prepared with Dragon dictation along with smaller phrase technology. Any transcriptional errors that result  from this process are unintentional.

## 2020-07-12 NOTE — Consult Note (Signed)
Cardiology Consultation:   Patient ID: PLEZ BELTON MRN: [id number]; DOB: Dec 12, 1945  Admit date: 07/11/2020 Date of Consult: 07/12/2020  Primary Care Provider: [name], MD [facility] Cardiologist: [facility] Physician requesting consult: Dr. Manuella Ghazi Reason for consult: Non-STEMI, chest pain/angina   Patient Profile:   Bryan Scott is a 75 y.o. male with a hx of uncontrolled diabetes type 2 with complications, coronary artery disease, CABG, atrial fibrillation on Xarelto, hypertension, hyperlipidemia, presenting with blurred vision, ringing in his ears, chest pain, elevated troponin concerning for unstable angina  History of Present Illness:   Bryan Scott reports he was in his usual state of health, past several days with chest pain, somewhat similar to prior anginal symptoms, also with additional symptoms including blurred vision, ringing in his ears Reports typically seen at the [facility] hospital Denies any recent cardiac catheterization Underwent stress test June 2021 normal ejection fraction, low risk study, no significant ischemia  Reports he is undergoing work-up for pancreatic mass at the [place] Has had some chronic nausea, takes nausea medication Scheduled for biopsy July 23, 2020 at the [place] He is very concerned about this and does not want to miss this appointment  Continues to have chest discomfort this morning, moderate in nature On heparin infusion Troponin I 07, repeat 103  MRI brain: No acute findings  He is concerned about new blockages EKG nonacute  Past Medical History:  Diagnosis Date  . 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 Cancer ([place])   . Carotid artery disease (HCC)    > 75% bilateral ICA stenoses on 01/2013 CT  . Chronic pain syndrome   . Coronary artery disease    s/p CABG ~ 2007  . GERD (gastroesophageal reflux disease)   . Heparin allergy    Bleeding  . History of tobacco use   . Hyperlipidemia   . Hypertension   . Ischemic cardiomyopathy    . Paroxysmal atrial fibrillation (HCC)    On Xarelto anticoagulation  . Type 2 diabetes mellitus (HCC)    A1C 6.8% in 01/2013    Past Surgical History:  Procedure Laterality Date  . CARDIAC CATHETERIZATION    . CORONARY ARTERY BYPASS GRAFT  2007     Home Medications:  Prior to Admission medications   Medication Sig Start Date End Date Taking? Authorizing Provider  amLODipine (NORVASC) 5 MG tablet Take 1 tablet (5 mg total) by mouth daily. Patient taking differently: Take 10 mg by mouth daily. 03/16/15  Yes Gouru, Illene Silver, MD  ascorbic acid (VITAMIN C) 500 MG tablet Take 500 mg by mouth daily.   Yes [provider]  atorvastatin (LIPITOR) 80 MG tablet Take 80 mg by mouth daily.   Yes [provider]  chlorthalidone (HYGROTON) 25 MG tablet Take 1 tablet by mouth daily. 02/01/20 02/01/21 Yes [provider]  Cholecalciferol (VITAMIN D3) 125 MCG (5000 UT) CAPS Take 5,000 Units by mouth daily.   Yes [provider]  empagliflozin (JARDIANCE) 25 MG TABS tablet Take 25 mg by mouth daily.   Yes [provider]  ezetimibe (ZETIA) 10 MG tablet Take 1 tablet (10 mg total) by mouth daily. 09/11/19  Yes Fritzi Mandes, MD  gabapentin (NEURONTIN) 100 MG capsule Take 100 mg by mouth 3 (three) times daily.   Yes [provider]  glipiZIDE (GLUCOTROL) 10 MG tablet Take 10 mg by mouth 2 (two) times daily before a meal.   Yes [provider]  guaiFENesin-dextromethorphan (ROBITUSSIN DM) 100-10 MG/5ML  syrup Take 5 mLs by mouth every 4 (four) hours as needed for cough. 07/16/19  Yes Thornell Mule, MD  hydrALAZINE (APRESOLINE) 25 MG tablet Take 25 mg by mouth 3 (three) times daily.   Yes [provider]  isosorbide mononitrate (IMDUR) 30 MG 24 hr tablet Take 1 tablet (30 mg total) by mouth daily. 12/14/19  Yes Mikhail, Velta Addison, DO  metFORMIN (GLUMETZA) 500 MG (MOD) 24 hr tablet Take 500 mg by mouth 2 (two) times daily with a meal.   Yes [provider]  morphine (MS CONTIN) 15 MG 12 hr tablet Take 15 mg by mouth every 12 (twelve) hours as needed for pain.   Yes [provider]  omeprazole (PRILOSEC OTC) 20 MG tablet Take 20 mg by mouth daily.   Yes [provider]  ondansetron (ZOFRAN) 8 MG tablet Take 8 mg by mouth every 8 (eight) hours as needed for nausea or vomiting.    Yes [provider]  rivaroxaban (XARELTO) 20 MG TABS tablet Take 1 tablet (20 mg total) by mouth daily with supper. 12/13/19  Yes Mikhail, Velta Addison, DO  spironolactone (ALDACTONE) 25 MG tablet Take 1 tablet (25 mg total) by mouth daily. 09/11/19  Yes Fritzi Mandes, MD  Zinc Sulfate (ZINC 15 PO) Take 1 tablet by mouth daily.   Yes [provider]    Inpatient Medications: Scheduled Meds: . atorvastatin  80 mg Oral Daily   Continuous Infusions: . heparin 1,100 Units/hr (07/12/20 1023)   PRN Meds: acetaminophen, morphine injection, nitroGLYCERIN  Allergies:    Allergies  Allergen Reactions  . 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 Heparin Other (See Comments)    Reaction:  Bleeding   . Lisinopril Swelling    Social History:   Social History   Socioeconomic History  . Marital status: Married    Spouse name: Not on file  . Number of children: Not on file  . Years of education: Not on file  . Highest education level: Not on file  Occupational History  . Not on file  Tobacco Use  . Smoking status: Former Smoker    Packs/day: 1.00    Years: 30.00    Pack years: 30.00    Types: Cigarettes  . Smokeless tobacco: Never Used  . Tobacco comment: 30 pack-year history. Quit 8-9 years ago.   Substance and Sexual Activity  . Alcohol use: No  . Drug use: No  . Sexual activity: Not on file  Other Topics Concern  . Not on file  Social History Narrative  . Not on file   Social Determinants of Health   Financial Resource Strain: Not on file  Food Insecurity: Not on file  Transportation Needs: Not on file  Physical  Activity: Not on file  Stress: Not on file  Social Connections: Not on file  Intimate Partner Violence: Not on file    Family History:    Family History  Problem Relation Age of Onset  . Heart disease Mother 87       Deceased  . CVA Mother   . Seizures Father 59       Deceased from complications with seizures     ROS:  Please see the history of present illness.  Review of Systems  Constitutional: Negative.   HENT: Negative.   Respiratory: Negative.   Cardiovascular: Negative.   Gastrointestinal: Negative.   Musculoskeletal: Negative.   Neurological: Negative.   Psychiatric/Behavioral: Negative.   All other systems reviewed and are negative.  Physical Exam/Data:   Vitals:   07/12/20 0900 07/12/20 1400 07/12/20 1500 07/12/20 1515  BP: (!) 121/52 (!) 114/59 (!) 169/84   Pulse: 61 60 67 65  Resp: 20 18    Temp:  98 F (36.7 C)    TempSrc:  Oral    SpO2: 92% 93% 97% 94%  Weight:      Height:        Intake/Output Summary (Last 24 hours) at 07/12/2020 1629 Last data filed at 07/12/2020 0734 Gross per 24 hour  Intake 1000 ml  Output 1300 ml  Net -300 ml   Last 3 Weights 07/11/2020 12/13/2019 12/12/2019  Weight (lbs) 175 lb 189 lb 11.2 oz 197 lb 12 oz  Weight (kg) 79.379 kg 86.047 kg 89.7 kg     Body mass index is 23.73 kg/m.  General:  Well nourished, well developed, in no acute distress HEENT: normal Lymph: no adenopathy Neck: no JVD Endocrine:  No thryomegaly Vascular: No carotid bruits; FA pulses 2+ bilaterally without bruits  Cardiac:  normal S1, S2; RRR; no murmur  Lungs:  clear to auscultation bilaterally, no wheezing, rhonchi or rales  Abd: soft, nontender, no hepatomegaly  Ext: no edema Musculoskeletal:  No deformities, BUE and BLE strength normal and equal Skin: warm and dry  Neuro:  CNs 2-12 intact, no focal abnormalities noted Psych:  Normal affect   EKG:  The EKG was personally reviewed and demonstrates:   Normal sinus rhythm rate 82 bpm,  PACs, baseline artifact Nonspecific ST T wave abnormality, concerning for anterolateral ischemia  Telemetry:  Telemetry was personally reviewed and demonstrates:  Normal sinus rhythm  Relevant CV Studies:  Echocardiogram March 2021 1. Left ventricular ejection fraction, by estimation, is 40 to 45%. The  left ventricle has mildly decreased function. The left ventricle has no  regional wall motion abnormalities. Left ventricular diastolic parameters  are consistent with Grade I  diastolic dysfunction (impaired relaxation).  2. Right ventricular systolic function is normal. The right ventricular  size is normal.  3. The mitral valve is normal in structure. Trivial mitral valve  regurgitation. No evidence of mitral stenosis.  4. The aortic valve is normal in structure. Aortic valve regurgitation is  not visualized. No aortic stenosis is present.  5. The inferior vena cava is normal in size with greater than 50%  respiratory variability, suggesting right atrial pressure of 3 mmHg.   Laboratory Data:  High Sensitivity Troponin:   Recent Labs  Lab 07/11/20 1710 07/11/20 1843 07/11/20 2037 07/11/20 2238 07/12/20 0052  TROPONINIHS 100* 107* 103* 89* 89*     Chemistry Recent Labs  Lab 07/11/20 1606 07/11/20 1843  NA 127* 132*  K 3.5 3.6  CL 89* 93*  CO2 21* 27  GLUCOSE 184* 161*  BUN 34* 34*  CREATININE 1.44* 1.26*  CALCIUM 10.0 9.5  GFRNONAA 51* 60*  ANIONGAP 17* 12    No results for input(s): PROT, ALBUMIN, AST, ALT, ALKPHOS, BILITOT in the last 168 hours. Hematology Recent Labs  Lab 07/11/20 1606 07/12/20 0308  WBC 16.6* 14.9*  RBC 5.67 5.00  HGB 18.7* 16.6  HCT 51.7 45.6  MCV 91.2 91.2  MCH 33.0 33.2  MCHC 36.2* 36.4*  RDW 11.9 11.9  PLT 200 172   BNPNo results for input(s): BNP, PROBNP in the last 168 hours.  DDimer No results for input(s): DDIMER in the last 168 hours.   Radiology/Studies:  DG Chest 2 View  Result Date: 07/11/2020 CLINICAL  DATA:  Chest  pain, shortness of breath, anemia EXAM: CHEST - 2 VIEW COMPARISON:  12/12/2019 FINDINGS: Frontal and lateral views of the chest demonstrate postsurgical changes from previous CABG. Cardiac silhouette is stable. There is background emphysema, without airspace disease, effusion, or pneumothorax. No acute bony abnormalities. IMPRESSION: 1. Emphysema, no acute airspace disease. Electronically Signed   By: Randa Ngo M.D.   On: 07/11/2020 15:52   MR BRAIN W WO CONTRAST  Result Date: 07/12/2020 CLINICAL DATA:  Dizziness.  Diplopia.  Visual disturbance. EXAM: MRI HEAD WITHOUT AND WITH CONTRAST TECHNIQUE: Multiplanar, multiecho pulse sequences of the brain and surrounding structures were obtained without and with intravenous contrast. CONTRAST:  28mL GADAVIST GADOBUTROL 1 MMOL/ML IV SOLN COMPARISON:  09/09/2019 FINDINGS: Brain: The brain has a normal appearance without evidence of malformation, atrophy, old or acute small or large vessel infarction, mass lesion, hemorrhage, hydrocephalus or extra-axial collection. Vascular: Major vessels at the base of the brain show flow. Venous sinuses appear patent. Skull and upper cervical spine: Normal. Sinuses/Orbits: Chronic complete opacification of both maxillary sinuses. Small left mastoid effusion. Orbits appear normal. Other: None significant. IMPRESSION: 1. Normal appearance of the brain itself. No cause of the presenting symptoms is identified. 2. Chronic complete opacification of both maxillary sinuses. Small left mastoid effusion. Electronically Signed   By: Nelson Chimes M.D.   On: 07/12/2020 12:20     Assessment and Plan:   1. Non-STEMI Known disease, prior CABG Echocardiogram 6 months ago with depressed ejection fraction 45% Presenting with several days of chest pain CT chest with three-vessel coronary calcification, grafts noted -Discussed various treatment options with him, medical management versus stress testing versus cardiac  catheterization -He is concerned about his symptoms, would prefer not to have stress testing as he had placed June 2021 and was unrevealing -He prefers cardiac catheterization especially in light of need for pancreatic work-up in the next week or so at the [place] -I have reviewed the risks, indications, and alternatives to cardiac catheterization, possible angioplasty, and stenting with the patient. Risks include but are not limited to bleeding, infection, vascular injury, stroke, myocardial infection, arrhythmia, kidney injury, radiation-related injury in the case of prolonged fluoroscopy use, emergency cardiac surgery, and death. The patient understands the risks of serious complication is 1-2 in [id number] with diagnostic cardiac cath and 1-2% or less with angioplasty/stenting.  -Catheterization will be scheduled tomorrow  2.  Blurry vision/tinnitus Etiology unclear, head head MRI, no acute findings  3.  Atrial fibrillation, paroxysmal Xarelto has been held Last dose of Xarelto morning of January 26 Will be placed on heparin infusion  4.  Pancreatic mass, abdominal pain Also reports chronic nausea Scheduled for biopsy at the [facility] July 23, 2020  5.  Diabetes type 2 with complications Hemoglobin A1c elevated in the past from 7-10 Managed at the [facility]   Total encounter time more than 110 minutes  Greater than 50% was spent in counseling and coordination of care with the patient   For questions or updates, please contact [place] Please consult [email or web address] for contact info under    Signed, Ida Rogue, MD  07/12/2020 4:29 PM

## 2020-07-12 NOTE — Progress Notes (Signed)
  Subjective:  Patient ID: Bryan Scott, male    DOB: 01/17/1946,  MRN: [id number]  Chief Complaint  Patient presents with  . Foot Pain    Patient referred by [facility] for neuropathy bilat.  He says right is worse than left but they feel cold all the time, tingle, numb and has balance issues    75 y.o. male presents with the above complaint. History confirmed with patient.  Currently takes gabapentin  Objective:  Physical Exam: warm, good capillary refill, no trophic changes or ulcerative lesions and normal DP and PT pulses.  Abnormal sensory exam with loss of protective sensation to monofilament tips of toes  Assessment:   1. Diabetic peripheral neuropathy ([place])   2. Uncontrolled type 2 diabetes mellitus with hyperglycemia ([place])      Plan:  Patient was evaluated and treated and all questions answered.   Patient educated on diabetes. Discussed proper diabetic foot care and discussed risks and complications of disease. Educated patient in depth on reasons to return to the office immediately should he/she discover anything concerning or new on the feet. All questions answered. Discussed proper shoes as well.   Discussed with her neuropathy is mostly a treat chronically.  I advised him on the importance of neuropathy and how it relates to foot health and injuries, wounds, ulcerations and infections.  Advised him to be seen by his PCP or neurologist for further treatment.  Referral sent to [facility] clinic neurology  Return if symptoms worsen or fail to improve.

## 2020-07-12 NOTE — ED Notes (Addendum)
Blue top recollected at this time d/t hemolysis of first blue top sent to lab with a.m. labs

## 2020-07-12 NOTE — Progress Notes (Signed)
Patient arrived to unit and admitted into room by this RN and RN Franki Cabot AD  2A. Patients vitals WDL. Heparin going at 2mL/hr. Left AC PIV removed. Patient required extended compression to stop site from bleeding. Bleeding stopped at this time, gauze and tape applied. Patient resting comfortably in bed at this time.

## 2020-07-12 NOTE — Progress Notes (Signed)
ANTICOAGULATION CONSULT NOTE - Initial Consult  Pharmacy Consult for Heparin  Indication: chest pain/ACS  Allergies  Allergen Reactions  . 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 Heparin Other (See Comments)    Reaction:  Bleeding   . Lisinopril Swelling    Patient Measurements: Height: 6' (182.9 cm) Weight: 79.4 kg (175 lb) IBW/kg (Calculated) : 77.6 Heparin Dosing Weight:  79.4 kg   Vital Signs: Temp: 97.6 F (36.4 C) (01/27 1724) Temp Source: Oral (01/27 1724) BP: 126/88 (01/27 1724) Pulse Rate: 67 (01/27 1724)  Labs: Recent Labs    07/11/20 1605 07/11/20 1606 07/11/20 1710 07/11/20 1829 07/11/20 1843 07/11/20 2037 07/11/20 2238 07/12/20 0052 07/12/20 0308 07/12/20 0914 07/12/20 1851  HGB  --  18.7*  --   --   --   --   --   --  16.6  --   --   HCT  --  51.7  --   --   --   --   --   --  45.6  --   --   PLT  --  200  --   --   --   --   --   --  172  --   --   APTT 34  --   --   --   --   --   --   --   --  84*  --   LABPROT  --  13.6  --   --   --   --   --   --   --   --   --   INR  --  1.1  --   --   --   --   --   --   --   --   --   HEPARINUNFRC  --   --   --  <0.10*  --   --   --   --   --  0.28* 0.35  CREATININE  --  1.44*  --   --  1.26*  --   --   --   --   --   --   TROPONINIHS  --  62*   < >  --  107* 103* 89* 89*  --   --   --    < > = values in this interval not displayed.    Estimated Creatinine Clearance: 56.5 mL/min (A) (by C-G formula based on SCr of 1.26 mg/dL (H)).   Medical History: Past Medical History:  Diagnosis Date  . 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 Cancer ([place])   . Carotid artery disease (HCC)    > 75% bilateral ICA stenoses on 01/2013 CT  . Chronic pain syndrome   . Coronary artery disease    s/p CABG ~ 2007  . GERD (gastroesophageal reflux disease)   . Heparin allergy    Bleeding  . History of tobacco use   . Hyperlipidemia   . Hypertension   . Ischemic cardiomyopathy   . Paroxysmal atrial fibrillation (HCC)     On Xarelto anticoagulation  . Type 2 diabetes mellitus (HCC)    A1C 6.8% in 01/2013    Medications:  Heparin Dosing Weight:  79.4 kg  Medications Prior to Admission  Medication Sig Dispense Refill Last Dose  . amLODipine (NORVASC) 5 MG tablet Take 1 tablet (5 mg total) by mouth daily. (Patient taking differently: Take 10 mg by mouth daily.) 30 tablet 0 07/11/2020  at 0830  . ascorbic acid (VITAMIN C) 500 MG tablet Take 500 mg by mouth daily.   07/11/2020 at 0830  . atorvastatin (LIPITOR) 80 MG tablet Take 80 mg by mouth daily.   07/11/2020 at 0830  . chlorthalidone (HYGROTON) 25 MG tablet Take 1 tablet by mouth daily.   07/11/2020 at 0830  . Cholecalciferol (VITAMIN D3) 125 MCG (5000 UT) CAPS Take 5,000 Units by mouth daily.   07/11/2020 at 0830  . empagliflozin (JARDIANCE) 25 MG TABS tablet Take 25 mg by mouth daily.     Marland Kitchen ezetimibe (ZETIA) 10 MG tablet Take 1 tablet (10 mg total) by mouth daily. 30 tablet 0   . gabapentin (NEURONTIN) 100 MG capsule Take 100 mg by mouth 3 (three) times daily.   07/11/2020 at 0830  . glipiZIDE (GLUCOTROL) 10 MG tablet Take 10 mg by mouth 2 (two) times daily before a meal.   07/11/2020 at 0830  . guaiFENesin-dextromethorphan (ROBITUSSIN DM) 100-10 MG/5ML syrup Take 5 mLs by mouth every 4 (four) hours as needed for cough. 118 mL 0 prn at prn  . hydrALAZINE (APRESOLINE) 25 MG tablet Take 25 mg by mouth 3 (three) times daily.   07/11/2020 at 0830  . isosorbide mononitrate (IMDUR) 30 MG 24 hr tablet Take 1 tablet (30 mg total) by mouth daily. 30 tablet 0 07/11/2020 at 0830  . metFORMIN (GLUMETZA) 500 MG (MOD) 24 hr tablet Take 500 mg by mouth 2 (two) times daily with a meal.   07/11/2020 at 0830  . morphine (MS CONTIN) 15 MG 12 hr tablet Take 15 mg by mouth every 12 (twelve) hours as needed for pain.   prn at prn  . omeprazole (PRILOSEC OTC) 20 MG tablet Take 20 mg by mouth daily.   07/11/2020 at 0830  . ondansetron (ZOFRAN) 8 MG tablet Take 8 mg by mouth every 8 (eight)  hours as needed for nausea or vomiting.    prn at prn  . rivaroxaban (XARELTO) 20 MG TABS tablet Take 1 tablet (20 mg total) by mouth daily with supper. 30 tablet 0 07/10/2020 at 1730  . spironolactone (ALDACTONE) 25 MG tablet Take 1 tablet (25 mg total) by mouth daily. 30 tablet 0 07/11/2020 at 0830  . Zinc Sulfate (ZINC 15 PO) Take 1 tablet by mouth daily.   07/11/2020 at 0830    Assessment: Pharmacy consulted to dose heparin in this 75 year old male admitted with ACS/NSTEMI.  CrCl = 79.4 ml/min Pt was on Xarelto 20 mg PO daily at home, last dose was on 1/26 @ 0830.   Pt has heparin listed as allergy with "bleeding" listed as reaction.  Pt has had heparin and lovenox on multiple prior admissions without issue.  MD ok to proceed with heparin.  1/27 @ 1848 - pt experienced bleeding from IV removal - confirmed with nursing @ 1930 bleeding has stopped and heparin gtt still active  Date Time HL/aPTT Rate/Comment 1/26 1829 <0.1 / 34s Started 950 units/hr w/o bolus 1/27 0914 0.28 / 84s APTT and HL correlating (xarelto PTA) 1/27 1851 0.35  Therapeutic x1 @ 1100 units/hr  Goal of Therapy:  Heparin level 0.3-0.7 units/ml aPTT 66 - 102  seconds Monitor platelets by anticoagulation protocol: Yes   Plan:  Despite Xarelto last dose 1/26 0830, pt's aPTT and anti-Xa have begun correlating and were normal at baseline. Will continue to dose heparin gtt per HL alone.  Therapeutic x 1, Continue infusion rate of 1100 units/hr Recheck HL in  8 hours Check CBC daily.  Benn Moulder, PharmD Pharmacy Resident  07/12/2020 7:28 PM

## 2020-07-12 NOTE — ED Notes (Signed)
Report to [place], rn

## 2020-07-12 NOTE — Plan of Care (Signed)

## 2020-07-12 NOTE — ED Notes (Signed)
Pt arrives to C POD via stretcher-- awake and alert; no acute distress noted.  GCS 15.  Reports ongoing 9/10 mid sternal cp described as sharp.  RR even and unlabored on RA. Cardiac monitoring and pulse ox maintained.  2mg  IVP Morphine administered at this time.  NS infusing @75ml /hr with Hep drip infusing @9 .35ml/hr to 20G L AC; R AC saline lock removed at this time due to infiltration with NS flush.  Pt denies any immediate needs, questions, concerns.

## 2020-07-12 NOTE — ED Notes (Signed)
Given breakfast tray. 

## 2020-07-12 NOTE — ED Notes (Signed)
Taken to MRI 

## 2020-07-12 NOTE — ED Notes (Signed)
Pt resting with eyes closed; RR even and unlabored on RA -- pt aroused spontanously upon nurse entering room -- Pt reports pain level again 10/10 -- states after pain med given pain level usually drops between 7-8/10; pain still localized to mid-sternal chest region.  Cardiac monitoring and pulse ox monitoring maintained; VSS.  Hep drip continues to infuse @9 .46ml/hr to 20G LUE -- NS fluid stopped after expiring this a.m.  -- hep level collected and sent to lab; results pending.  Will monitor for acute changes and maintain plan of care.

## 2020-07-13 ENCOUNTER — Other Ambulatory Visit: Payer: Self-pay

## 2020-07-13 ENCOUNTER — Encounter
Admission: EM | Disposition: A | Discharge status: Home Health | Payer: Self-pay | Source: Ambulatory Visit | Attending: Family Medicine

## 2020-07-13 DIAGNOSIS — R221 Localized swelling, mass and lump, neck: Secondary | ICD-10-CM | POA: Diagnosis present

## 2020-07-13 DIAGNOSIS — E871 Hypo-osmolality and hyponatremia: Secondary | ICD-10-CM | POA: Diagnosis present

## 2020-07-13 DIAGNOSIS — I25708 Atherosclerosis of coronary artery bypass graft(s), unspecified, with other forms of angina pectoris: Secondary | ICD-10-CM | POA: Diagnosis not present

## 2020-07-13 DIAGNOSIS — H9319 Tinnitus, unspecified ear: Secondary | ICD-10-CM | POA: Diagnosis present

## 2020-07-13 DIAGNOSIS — I25110 Atherosclerotic heart disease of native coronary artery with unstable angina pectoris: Secondary | ICD-10-CM | POA: Diagnosis not present

## 2020-07-13 DIAGNOSIS — N179 Acute kidney failure, unspecified: Secondary | ICD-10-CM | POA: Diagnosis present

## 2020-07-13 DIAGNOSIS — I110 Hypertensive heart disease with heart failure: Secondary | ICD-10-CM | POA: Diagnosis present

## 2020-07-13 DIAGNOSIS — E782 Mixed hyperlipidemia: Secondary | ICD-10-CM | POA: Diagnosis not present

## 2020-07-13 DIAGNOSIS — Y92230 Patient room in hospital as the place of occurrence of the external cause: Secondary | ICD-10-CM | POA: Diagnosis not present

## 2020-07-13 DIAGNOSIS — E860 Dehydration: Secondary | ICD-10-CM | POA: Diagnosis present

## 2020-07-13 DIAGNOSIS — I10 Essential (primary) hypertension: Secondary | ICD-10-CM | POA: Diagnosis not present

## 2020-07-13 DIAGNOSIS — E118 Type 2 diabetes mellitus with unspecified complications: Secondary | ICD-10-CM | POA: Diagnosis present

## 2020-07-13 DIAGNOSIS — I2510 Atherosclerotic heart disease of native coronary artery without angina pectoris: Secondary | ICD-10-CM | POA: Diagnosis present

## 2020-07-13 DIAGNOSIS — K219 Gastro-esophageal reflux disease without esophagitis: Secondary | ICD-10-CM | POA: Diagnosis present

## 2020-07-13 DIAGNOSIS — I2583 Coronary atherosclerosis due to lipid rich plaque: Secondary | ICD-10-CM | POA: Diagnosis not present

## 2020-07-13 DIAGNOSIS — E785 Hyperlipidemia, unspecified: Secondary | ICD-10-CM | POA: Diagnosis present

## 2020-07-13 DIAGNOSIS — I255 Ischemic cardiomyopathy: Secondary | ICD-10-CM | POA: Diagnosis present

## 2020-07-13 DIAGNOSIS — I214 Non-ST elevation (NSTEMI) myocardial infarction: Secondary | ICD-10-CM | POA: Diagnosis present

## 2020-07-13 DIAGNOSIS — R599 Enlarged lymph nodes, unspecified: Secondary | ICD-10-CM | POA: Diagnosis present

## 2020-07-13 DIAGNOSIS — R1310 Dysphagia, unspecified: Secondary | ICD-10-CM | POA: Diagnosis present

## 2020-07-13 DIAGNOSIS — I2582 Chronic total occlusion of coronary artery: Secondary | ICD-10-CM | POA: Diagnosis present

## 2020-07-13 DIAGNOSIS — I5042 Chronic combined systolic (congestive) and diastolic (congestive) heart failure: Secondary | ICD-10-CM | POA: Diagnosis present

## 2020-07-13 DIAGNOSIS — F419 Anxiety disorder, unspecified: Secondary | ICD-10-CM | POA: Diagnosis present

## 2020-07-13 DIAGNOSIS — K869 Disease of pancreas, unspecified: Secondary | ICD-10-CM | POA: Diagnosis present

## 2020-07-13 DIAGNOSIS — G894 Chronic pain syndrome: Secondary | ICD-10-CM | POA: Diagnosis present

## 2020-07-13 DIAGNOSIS — R110 Nausea: Secondary | ICD-10-CM | POA: Diagnosis present

## 2020-07-13 DIAGNOSIS — Z20822 Contact with and (suspected) exposure to covid-19: Secondary | ICD-10-CM | POA: Diagnosis present

## 2020-07-13 DIAGNOSIS — I480 Paroxysmal atrial fibrillation: Secondary | ICD-10-CM | POA: Diagnosis present

## 2020-07-13 DIAGNOSIS — D72829 Elevated white blood cell count, unspecified: Secondary | ICD-10-CM | POA: Diagnosis present

## 2020-07-13 DIAGNOSIS — Z87892 Personal history of anaphylaxis: Secondary | ICD-10-CM | POA: Diagnosis not present

## 2020-07-13 HISTORY — PX: LEFT HEART CATH AND CORS/GRAFTS ANGIOGRAPHY: CATH118250

## 2020-07-13 HISTORY — PX: CORONARY STENT INTERVENTION: CATH118234

## 2020-07-13 LAB — GLUCOSE, CAPILLARY
Glucose-Capillary: 190 mg/dL — ABNORMAL HIGH (ref 70–99)
Glucose-Capillary: 183 mg/dL — ABNORMAL HIGH (ref 70–99)

## 2020-07-13 LAB — BASIC METABOLIC PANEL
Anion gap: 12 (ref 5–15)
BUN: 34 mg/dL — ABNORMAL HIGH (ref 8–23)
CO2: 25 mmol/L (ref 22–32)
Calcium: 9.4 mg/dL (ref 8.9–10.3)
Chloride: 97 mmol/L — ABNORMAL LOW (ref 98–111)
Creatinine, Ser: 1.22 mg/dL (ref 0.61–1.24)
GFR, Estimated: >60 mL/min (ref >60)
Glucose, Bld: 219 mg/dL — ABNORMAL HIGH (ref 70–99)
Potassium: 3.5 mmol/L (ref 3.5–5.1)
Sodium: 134 mmol/L — ABNORMAL LOW (ref 135–145)

## 2020-07-13 LAB — CBC
HCT: 50.8 % (ref 39.0–52.0)
Hemoglobin: 18.3 g/dL — ABNORMAL HIGH (ref 13.0–17.0)
MCH: 32.7 pg (ref 26.0–34.0)
MCHC: 36 g/dL (ref 30.0–36.0)
MCV: 90.7 fL (ref 80.0–100.0)
Platelets: 166 10*3/uL (ref 150–400)
RBC: 5.6 MIL/uL (ref 4.22–5.81)
RDW: 11.9 % (ref 11.5–15.5)
WBC: 10.8 10*3/uL — ABNORMAL HIGH (ref 4.0–10.5)
nRBC: 0 % (ref 0.0–0.2)

## 2020-07-13 LAB — POCT ACTIVATED CLOTTING TIME: Activated Clotting Time: 464 seconds

## 2020-07-13 LAB — HEPARIN LEVEL (UNFRACTIONATED): Heparin Unfractionated: 0.55 IU/mL (ref 0.30–0.70)

## 2020-07-13 SURGERY — LEFT HEART CATH AND CORS/GRAFTS ANGIOGRAPHY
Anesthesia: Moderate Sedation

## 2020-07-13 MED ORDER — LABETALOL HCL 5 MG/ML IV SOLN: 10.0000 mg | INTRAVENOUS | Status: AC | PRN

## 2020-07-13 MED ORDER — CLOPIDOGREL BISULFATE 75 MG PO TABS
75.0000 mg | ORAL_TABLET | Freq: Every day | ORAL | Status: DC
  Administered 2020-07-14 – 2020-07-21 (×8): 75 mg via ORAL
  Filled 2020-07-13 (×8): qty 1

## 2020-07-13 MED ORDER — FENTANYL CITRATE (PF) 100 MCG/2ML IJ SOLN
INTRAMUSCULAR | Status: AC
  Filled 2020-07-13: qty 2

## 2020-07-13 MED ORDER — SODIUM CHLORIDE 0.9% FLUSH
3.0000 mL | INTRAVENOUS | Status: DC | PRN
  Administered 2020-07-18 – 2020-07-20 (×2): 3 mL via INTRAVENOUS

## 2020-07-13 MED ORDER — MIDAZOLAM HCL 2 MG/2ML IJ SOLN
1.0000 mg | INTRAMUSCULAR | Status: DC | PRN
  Administered 2020-07-13: 1 mg via INTRAVENOUS

## 2020-07-13 MED ORDER — MIDAZOLAM HCL 2 MG/2ML IJ SOLN
INTRAMUSCULAR | Status: AC
  Filled 2020-07-13: qty 2

## 2020-07-13 MED ORDER — IOHEXOL 300 MG/ML  SOLN
INTRAMUSCULAR | Status: DC | PRN
  Administered 2020-07-13: 155 mL

## 2020-07-13 MED ORDER — BIVALIRUDIN TRIFLUOROACETATE 250 MG IV SOLR
INTRAVENOUS | Status: AC
  Filled 2020-07-13: qty 250

## 2020-07-13 MED ORDER — IOHEXOL 300 MG/ML  SOLN
INTRAMUSCULAR | Status: DC | PRN
  Administered 2020-07-13: 40 mL via INTRA_ARTERIAL

## 2020-07-13 MED ORDER — SODIUM CHLORIDE 0.9 % IV SOLN
INTRAVENOUS | Status: DC | PRN
  Administered 2020-07-13: 1.75 mg/kg/h via INTRAVENOUS

## 2020-07-13 MED ORDER — CLOPIDOGREL BISULFATE 75 MG PO TABS
ORAL_TABLET | ORAL | Status: DC | PRN
  Administered 2020-07-13: 600 mg via ORAL

## 2020-07-13 MED ORDER — BIVALIRUDIN BOLUS VIA INFUSION - CUPID
INTRAVENOUS | Status: DC | PRN
  Administered 2020-07-13: 60.15 mg via INTRAVENOUS

## 2020-07-13 MED ORDER — HEPARIN (PORCINE) IN NACL 1000-0.9 UT/500ML-% IV SOLN
INTRAVENOUS | Status: AC
  Filled 2020-07-13: qty 1000

## 2020-07-13 MED ORDER — HYDRALAZINE HCL 20 MG/ML IJ SOLN: 10.0000 mg | INTRAMUSCULAR | Status: AC | PRN

## 2020-07-13 MED ORDER — MORPHINE SULFATE (PF) 2 MG/ML IV SOLN
INTRAVENOUS | Status: AC
  Administered 2020-07-13: 2 mg via INTRAVENOUS
  Filled 2020-07-13: qty 1

## 2020-07-13 MED ORDER — SODIUM CHLORIDE 0.9 % IV SOLN: 250.0000 mL | INTRAVENOUS | Status: DC | PRN

## 2020-07-13 MED ORDER — SODIUM CHLORIDE 0.9% FLUSH
3.0000 mL | Freq: Two times a day (BID) | INTRAVENOUS | Status: DC
  Administered 2020-07-13 – 2020-07-21 (×15): 3 mL via INTRAVENOUS

## 2020-07-13 MED ORDER — MIDAZOLAM HCL 2 MG/2ML IJ SOLN
INTRAMUSCULAR | Status: DC | PRN
  Administered 2020-07-13 (×2): 0.5 mg via INTRAVENOUS
  Administered 2020-07-13: 1 mg via INTRAVENOUS

## 2020-07-13 MED ORDER — NITROGLYCERIN 1 MG/10 ML FOR IR/CATH LAB
INTRA_ARTERIAL | Status: DC | PRN
  Administered 2020-07-13: 200 ug via INTRACORONARY

## 2020-07-13 MED ORDER — FENTANYL CITRATE (PF) 100 MCG/2ML IJ SOLN
INTRAMUSCULAR | Status: DC | PRN
  Administered 2020-07-13: 12.5 ug via INTRAVENOUS
  Administered 2020-07-13: 25 ug via INTRAVENOUS
  Administered 2020-07-13: 12.5 ug via INTRAVENOUS

## 2020-07-13 MED ORDER — HEPARIN (PORCINE) 25000 UT/250ML-% IV SOLN
1110.0000 [IU]/h | INTRAVENOUS | Status: DC
  Administered 2020-07-13 – 2020-07-14 (×2): 1100 [IU]/h via INTRAVENOUS
  Filled 2020-07-13: qty 250

## 2020-07-13 MED ORDER — ONDANSETRON HCL 4 MG/2ML IJ SOLN
4.0000 mg | Freq: Four times a day (QID) | INTRAMUSCULAR | Status: DC | PRN
  Administered 2020-07-13 – 2020-07-20 (×3): 4 mg via INTRAVENOUS
  Filled 2020-07-13 (×3): qty 2

## 2020-07-13 MED ORDER — HEPARIN (PORCINE) IN NACL 1000-0.9 UT/500ML-% IV SOLN
INTRAVENOUS | Status: DC | PRN
  Administered 2020-07-13 (×3): 500 mL

## 2020-07-13 MED ORDER — SODIUM CHLORIDE 0.9 % IV SOLN: INTRAVENOUS | Status: AC

## 2020-07-13 MED ORDER — CLOPIDOGREL BISULFATE 300 MG PO TABS
ORAL_TABLET | ORAL | Status: AC
  Filled 2020-07-13: qty 2

## 2020-07-13 MED ORDER — MORPHINE SULFATE (PF) 2 MG/ML IV SOLN
INTRAVENOUS | Status: AC
  Filled 2020-07-13: qty 1

## 2020-07-13 MED ORDER — SODIUM CHLORIDE 0.9 % NICU IV INFUSION SIMPLE
INJECTION | INTRAVENOUS | Status: DC
  Filled 2020-07-13 (×25): qty 500

## 2020-07-13 MED ORDER — CHLORHEXIDINE GLUCONATE CLOTH 2 % EX PADS: 6.0000 | MEDICATED_PAD | Freq: Every day | CUTANEOUS | Status: DC

## 2020-07-13 MED ORDER — HYDRALAZINE HCL 20 MG/ML IJ SOLN
INTRAMUSCULAR | Status: AC
  Filled 2020-07-13: qty 1

## 2020-07-13 SURGICAL SUPPLY — 19 items
BALLN TREK RX 2.5X12 (BALLOONS) ×2
BALLN ~~LOC~~ TREK RX 3.5X12 (BALLOONS) ×2
BALLOON TREK RX 2.5X12 (BALLOONS) ×1 IMPLANT
BALLOON ~~LOC~~ TREK RX 3.5X12 (BALLOONS) ×1 IMPLANT
CATH INFINITI 5FR ANG PIGTAIL (CATHETERS) ×2 IMPLANT
CATH INFINITI 5FR JL4 (CATHETERS) ×2 IMPLANT
CATH INFINITI JR4 5F (CATHETERS) ×2 IMPLANT
CATH VISTA GUIDE 6FR MPA1 (CATHETERS) ×2 IMPLANT
CATH VISTA GUIDE RCB (CATHETERS) ×2 IMPLANT
KIT ENCORE 26 ADVANTAGE (KITS) ×2 IMPLANT
KIT MANI 3VAL PERCEP (MISCELLANEOUS) ×2 IMPLANT
KIT TRANSPAC II SGL 4260605 (MISCELLANEOUS) ×2 IMPLANT
NEEDLE PERC 18GX7CM (NEEDLE) ×2 IMPLANT
PACK CARDIAC CATH (CUSTOM PROCEDURE TRAY) ×2 IMPLANT
SHEATH AVANTI 5FR X 11CM (SHEATH) ×2 IMPLANT
SHEATH AVANTI 6FR X 11CM (SHEATH) ×2 IMPLANT
STENT RESOLUTE ONYX 3.5X18 (Permanent Stent) ×2 IMPLANT
WIRE GUIDERIGHT .035X150 (WIRE) ×2 IMPLANT
WIRE RUNTHROUGH .014X180CM (WIRE) ×2 IMPLANT

## 2020-07-13 NOTE — OR Nursing (Signed)
Dr Rockey Situ came to assess pt. Pt reported inability to void lying flat. Pt requested #14FR Foley cath/ placed with clear yellow urine return. Pt continues to have chest pain 5-6/10. No new orders

## 2020-07-13 NOTE — Progress Notes (Signed)
Progress Note  Patient Name: Bryan Scott Date of Encounter: 07/13/2020  [facility] Cardiologist:  [facility], follows at the [facility]  Subjective   Continue chest pain overnight, long discussion with him this morning concerning options for his work-up including stress test versus catheterization As we discussed the day prior, we proceeded with catheterization today -Details as below, In brief occluded vein graft to the diagonal, Severe lesion noted in the vein graft to the PDA, underwent PCI Native disease of the RCA, left circumflex  Anxiety and issue Results discussed with her of attorney, Bryan Scott Reports he has endoscopy/biopsy scheduled at the [place] early February for mass.  Unclear location, pancreatic versus small bowel  Inpatient Medications    Scheduled Meds: . atorvastatin  80 mg Oral Daily  . Chlorhexidine Gluconate Cloth  6 each Topical Daily  . [START ON 07/14/2020] clopidogrel  75 mg Oral Q breakfast  . midazolam      . morphine      . sodium chloride flush  3 mL Intravenous Q12H   Continuous Infusions: . sodium chloride    . sodium chloride    . heparin    . sodium chloride 0.9 %     PRN Meds: sodium chloride, acetaminophen, hydrALAZINE, labetalol, midazolam, morphine injection, nitroGLYCERIN, ondansetron (ZOFRAN) IV, sodium chloride flush, zolpidem   Vital Signs    Vitals:   07/13/20 1730 07/13/20 1745 07/13/20 1750 07/13/20 1817  BP: (!) 141/81 (!) 162/112 (!) 152/72 121/90  Pulse: 87 87  87  Resp: 19 19  15   Temp:    98.1 F (36.7 C)  TempSrc:    Oral  SpO2: 97% 96%  98%  Weight:      Height:        Intake/Output Summary (Last 24 hours) at 07/13/2020 1822 Last data filed at 07/13/2020 1700 Gross per 24 hour  Intake 240 ml  Output 2745 ml  Net -2505 ml   Last 3 Weights 07/13/2020 07/11/2020 12/13/2019  Weight (lbs) 176 lb 12.9 oz 175 lb 189 lb 11.2 oz  Weight (kg) 80.2 kg 79.379 kg 86.047 kg      Telemetry    Normal sinus  rhythm- Personally Reviewed  ECG     - Personally Reviewed  Physical Exam   GEN: No acute distress.   Neck: No JVD Cardiac: RRR, no murmurs, rubs, or gallops.  Respiratory: Clear to auscultation bilaterally. GI: Soft, nontender, non-distended  MS: No edema; No deformity. Neuro:  Nonfocal  Psych: Normal affect , mild anxiety  Labs    High Sensitivity Troponin:   Recent Labs  Lab 07/11/20 1710 07/11/20 1843 07/11/20 2037 07/11/20 2238 07/12/20 0052  TROPONINIHS 100* 107* 103* 89* 89*      Chemistry Recent Labs  Lab 07/11/20 1606 07/11/20 1843 07/13/20 0249  NA 127* 132* 134*  K 3.5 3.6 3.5  CL 89* 93* 97*  CO2 21* 27 25  GLUCOSE 184* 161* 219*  BUN 34* 34* 34*  CREATININE 1.44* 1.26* 1.22  CALCIUM 10.0 9.5 9.4  GFRNONAA 51* 60* >60  ANIONGAP 17* 12 12     Hematology Recent Labs  Lab 07/11/20 1606 07/12/20 0308 07/13/20 0249  WBC 16.6* 14.9* 10.8*  RBC 5.67 5.00 5.60  HGB 18.7* 16.6 18.3*  HCT 51.7 45.6 50.8  MCV 91.2 91.2 90.7  MCH 33.0 33.2 32.7  MCHC 36.2* 36.4* 36.0  RDW 11.9 11.9 11.9  PLT 200 172 166    BNPNo results for input(s): BNP,  PROBNP in the last 168 hours.   DDimer No results for input(s): DDIMER in the last 168 hours.   Radiology    MR BRAIN W WO CONTRAST  Result Date: 07/12/2020 CLINICAL DATA:  Dizziness.  Diplopia.  Visual disturbance. EXAM: MRI HEAD WITHOUT AND WITH CONTRAST TECHNIQUE: Multiplanar, multiecho pulse sequences of the brain and surrounding structures were obtained without and with intravenous contrast. CONTRAST:  32mL GADAVIST GADOBUTROL 1 MMOL/ML IV SOLN COMPARISON:  09/09/2019 FINDINGS: Brain: The brain has a normal appearance without evidence of malformation, atrophy, old or acute small or large vessel infarction, mass lesion, hemorrhage, hydrocephalus or extra-axial collection. Vascular: Major vessels at the base of the brain show flow. Venous sinuses appear patent. Skull and upper cervical spine: Normal.  Sinuses/Orbits: Chronic complete opacification of both maxillary sinuses. Small left mastoid effusion. Orbits appear normal. Other: None significant. IMPRESSION: 1. Normal appearance of the brain itself. No cause of the presenting symptoms is identified. 2. Chronic complete opacification of both maxillary sinuses. Small left mastoid effusion. Electronically Signed   By: Nelson Chimes M.D.   On: 07/12/2020 12:20   CARDIAC CATHETERIZATION  Result Date: 07/13/2020  Prox RCA to Dist RCA lesion is 100% stenosed.  Dist Graft lesion is 95% stenosed.  Ost LM to Mid LM lesion is 40% stenosed.  Ost Cx to Prox Cx lesion is 80% stenosed.  Mid Graft lesion is 20% stenosed.  Mid Cx lesion is 100% stenosed.  RPDA lesion is 90% stenosed.  Origin lesion is 100% stenosed.  The left ventricular ejection fraction is greater than 65% by visual estimate.  LV end diastolic pressure is normal.  The left ventricular systolic function is normal.  There is no aortic valve stenosis.    CARDIAC CATHETERIZATION  Addendum Date: 07/13/2020   Conclusions: 1. Multivessel coronary artery and bypass graft disease, as detailed in Dr. Donivan Scull diagnostic catheterization report.  Culprit lesion appears to be 95% stenosis in SVG-RCA. 2. Successful PCI to SVG-RCA using Resolute Onyx 3.5 x 18 mm drug-eluting stent with 0% residual stenosis and TIMI-3 flow. Recommendations: 1. Remove right femoral artery sheath 2 hours after discontinuation of bivalirudin. 2. Restart heparin 8 hours after sheath removal.  Transition to rivaroxaban tomorrow if no evidence of bleeding/vascular complication. 3. Continue clopidogrel for 12 months (defer aspirin in the setting of aspirin allergy on chronic anticoagulation with rivaroxaban). 4. Aggressive secondary prevention. Nelva Bush, MD [facility]   Result Date: 07/13/2020 Conclusions: 1. Multivessel coronary artery and bypass graft disease, as detailed in Dr. Donivan Scull diagnostic catheterization  report.  Culprit lesion appears to be 95% stenosis in SVG-RCA. 2. Successful PCI to SVG-RCA using Restolute Onyx 3.5 x 18 mm drug-eluting stent with 0% residual stenosis and TIMI-3 flow. Recommendations: 1. Remove right femoral artery sheath 2 hours after discontinuation of bivalirudin. 2. Restart heparin 8 hours after sheath removal.  Transition to rivaroxaban tomorrow if no evidence of bleeding/vascular complication. 3. Continue clopidogrel for 12 months (defer aspirin in the setting of aspirin allergy on chronic anticoagulation with rivaroxaban). 4. Aggressive secondary prevention. Nelva Bush, MD [facility]    Cardiac Studies   Cardiac catheterization as above  Patient Profile     Bryan Scott is a 75 y.o. male with a hx of uncontrolled diabetes type 2 with complications, coronary artery disease, CABG, atrial fibrillation on Xarelto, hypertension, hyperlipidemia, presenting with blurred vision, ringing in his ears, chest pain, elevated troponin concerning for unstable angina  Assessment & Plan    1. Non-STEMI  History of coronary artery disease, prior CABG Echocardiogram 6 months ago with depressed ejection fraction 45% Catheterization today with details as above Post procedure with continued chest pain, anxiety Reports allergy to aspirin, currently on Plavix -Xarelto for atrial fibrillation on hold -Given extra Versed following procedure with mild improvement of his symptoms  2.  Blurry vision/tinnitus Etiology unclear, head head scan, no acute findings  Seems to wax and wane On discussion with family/healthcare power of attorney, they were concerned it could be secondary to high hemoglobin which has been a chronic issue  3.  Atrial fibrillation, paroxysmal Xarelto has been held for catheterization Last dose of Xarelto morning of January 26 Now on Plavix.   4.  Pancreatic mass, abdominal pain  chronic nausea Exact details of his abdominal pathology is  unclear Scheduled for biopsy at the [facility] July 23, 2020 Biopsy may need to be delayed, unable to stop Plavix at this time  5.  Diabetes type 2 with complications Hemoglobin A1c elevated in the past from 7-10 Managed at the [place]  6.  Anxiety Unclear if he has PTSD, reports spending most of his life in the [employer] -Some anxiety concerning his GI pathology, recent cardiac issues Seems to do better on benzos  Long discussion with patient following procedure, nephew, update provided  Total encounter time more than 35 minutes  Greater than 50% was spent in counseling and coordination of care with the patient    For questions or updates, please contact [place] Please consult [email or web address] for contact info under        Signed, Ida Rogue, MD  07/13/2020, 6:22 PM

## 2020-07-13 NOTE — Progress Notes (Signed)
[place] at [place] NAME: Bryan Scott    MR#:  [id number]  DATE OF BIRTH:  1945/12/25  SUBJECTIVE:  CHIEF COMPLAINT:   Chief Complaint  Patient presents with  . Chest Pain  . Shortness of Breath  . Abnormal Lab  still having ongoing chest pain, blurry vision has improved.  Waiting for cardiac cath REVIEW OF SYSTEMS:  Review of Systems  Constitutional: Negative for diaphoresis, fever, malaise/fatigue and weight loss.  HENT: Negative for ear discharge, ear pain, hearing loss, nosebleeds, sore throat and tinnitus.   Eyes: Negative for pain.  Respiratory: Positive for shortness of breath. Negative for cough, hemoptysis and wheezing.   Cardiovascular: Positive for chest pain. Negative for palpitations, orthopnea and leg swelling.  Gastrointestinal: Negative for abdominal pain, blood in stool, constipation, diarrhea, heartburn, nausea and vomiting.  Genitourinary: Negative for dysuria, frequency and urgency.  Musculoskeletal: Negative for back pain and myalgias.  Skin: Negative for itching and rash.  Neurological: Negative for dizziness, tingling, tremors, focal weakness, seizures, weakness and headaches.  Psychiatric/Behavioral: Negative for depression. The patient is not nervous/anxious.    DRUG ALLERGIES:   Allergies  Allergen Reactions  . 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 Heparin Other (See Comments)    Reaction:  Bleeding   . Lisinopril Swelling   VITALS:  Blood pressure 131/73, pulse (P) 70, temperature 97.6 F (36.4 C), temperature source Oral, resp. rate (P) 14, height 6' (1.829 m), weight 80.2 kg, SpO2 (P) 96 %. PHYSICAL EXAMINATION:  Physical Exam HENT:     Head: Normocephalic and atraumatic.  Eyes:     Conjunctiva/sclera: Conjunctivae normal.     Pupils: Pupils are equal, round, and reactive to light.  Neck:     Thyroid: No thyromegaly.     Trachea: No tracheal deviation.  Cardiovascular:     Rate and Rhythm: Normal rate and regular  rhythm.     Heart sounds: Normal heart sounds.  Pulmonary:     Effort: Pulmonary effort is normal. No respiratory distress.     Breath sounds: Normal breath sounds. No wheezing.  Chest:     Chest wall: No tenderness.  Abdominal:     General: Bowel sounds are normal. There is no distension.     Palpations: Abdomen is soft.     Tenderness: There is no abdominal tenderness.  Musculoskeletal:        General: Normal range of motion.     Cervical back: Normal range of motion and neck supple.  Skin:    General: Skin is warm and dry.     Findings: No rash.  Neurological:     Mental Status: He is alert and oriented to person, place, and time.     Cranial Nerves: No cranial nerve deficit.    LABORATORY PANEL:  Male CBC Recent Labs  Lab 07/13/20 0249  WBC 10.8*  HGB 18.3*  HCT 50.8  PLT 166   ------------------------------------------------------------------------------------------------------------------ Chemistries  Recent Labs  Lab 07/13/20 0249  NA 134*  K 3.5  CL 97*  CO2 25  GLUCOSE 219*  BUN 34*  CREATININE 1.22  CALCIUM 9.4   RADIOLOGY:  No results found. ASSESSMENT AND PLAN:  75 y.o. male with medical history significant for CAD s/p CABG, hypertension, paroxysmal atrial fibrillation on anticoagulation, type 2 diabetes, hyperlipidemia and history of bladder cancer admitted for persistent chest pain and some blurred vision.  Chest pain/NSTEMI/coronary artery disease -Patient with known history of CAD status post CABG -He follows up with  cardiology at the [facility] -EKG does not show any acute findings -Initial troponin of 67 -->107->89.  -Nuclear stress test back in June 2021 was low risk -Cardiology planning cath today.  May require stents if blockage found  Blurry vision/tinnitus -no focal neurological findings-low suspicion of CVA -Patient had CT head back in March 2021 for dizziness and had paranasal sinus disease which could explain his current symptoms.    -MRI brain neg - monitor symptoms - outpt f/up with ENT/Ophthal  Hyponatremia Suspect due to hypovolemia. Na 132 ->134  AKI -present on admission and now resolved Likely prerenal given hyponatremia and hemoconcentrated hemoglobin and hematocrit Continue IV 75 cc NS infusion  Elevated anion gap -POA and resolved suspect from hypovolemia follow with repeat BMP after fluids  Leukocytosis Unclear etiology. No signs of infection Follow repeat CBC in the morning after fluids  essential hypertension -BP minimally elevated  Diabetes mellitus, type II -Last HbA1C of 8 in 04/2020  History of paroxysmal atrial fibrillation -Hold Xareto while on heparin gtt. Last dose morning of 1/26.  Xarelto can be resumed post cath if okay with cardiology  Hyperlipidemia -Continue statin  Pancreatic mass F/up at [facility] - Work up in progress   Body mass index is 23.98 kg/m.  Net IO Since Admission: -1,462.38 mL [07/13/20 1425]      Status is: Inpatient  Remains inpatient appropriate because:Ongoing diagnostic testing needed not appropriate for outpatient work up   Dispo: The patient is from: Home              Anticipated d/c is to: Home              Anticipated d/c date is: 1 day              Patient currently is not medically stable to d/c.  Getting cardiac cath and likely stenting   Difficult to place patient No   DVT prophylaxis:       SCDs Start: 07/11/20 1842 heparin drip started 1/26, can be transition back to Xarelto post cath if okay with cardiology    Family Communication: ("discussed with patient")   All the records are reviewed and case discussed with Care Management/Social Worker. Management plans discussed with the patient, nursing, cardiology and they are in agreement.  CODE STATUS: Full Code Level of care: Progressive Cardiac  TOTAL TIME TAKING CARE OF THIS PATIENT: 35 minutes.   More than 50% of the time was spent in counseling/coordination of care:  YES  POSSIBLE D/C IN 1-2 DAYS, DEPENDING ON CLINICAL CONDITION.   Max Sane M.D on 07/13/2020 at 2:25 PM  [facility]   CC: Primary care physician; [name], MD  Note: This dictation was prepared with Dragon dictation along with smaller phrase technology. Any transcriptional errors that result from this process are unintentional.

## 2020-07-13 NOTE — OR Nursing (Signed)
James from cath lab at bedside holding manual pressure post sheath removal

## 2020-07-13 NOTE — Progress Notes (Signed)
Initial Nutrition Assessment  DOCUMENTATION CODES:   Not applicable  INTERVENTION:  Will monitor for diet advancement and provide nutrition supplements as appropriate   NUTRITION DIAGNOSIS:   Increased nutrient needs related to acute illness as evidenced by estimated needs.    GOAL:   Patient will meet greater than or equal to 90% of their needs    MONITOR:   Weight trends,Labs,I & O's,Diet advancement,Supplement acceptance,PO intake  REASON FOR ASSESSMENT:   Malnutrition Screening Tool    ASSESSMENT:   75 year old male admitted with NSTEMI presented with chest pain. Past medical history significant of CAD s/p CABG, HTN, atrial fibrillation on anticoagulation, DM2, HLD, and history of bladder cancer.  Pt has been off floor today in cath lab. Unable to obtain nutrition history at this time. Per chart, weights have trended down ~13 lbs (6.7%) in the last 7 months. Wt loss is insignificant for time frame, however concerning given advanced age as well as noted work up in progress for pancreatic mass; followed by [place]. Will plan to obtain history and complete exam at follow-up. Will monitor for diet advancement and provide oral nutrition supplement as appropriate.  Medications reviewed  Labs: CBG 183 A1c 7.8 (H) on 12/13/19  NUTRITION - FOCUSED PHYSICAL EXAM: Unable to complete at this time, pt off floor   Diet Order:   Diet Order    None      EDUCATION NEEDS:   Not appropriate for education at this time  Skin:  Skin Assessment: Reviewed RN Assessment  Last BM:  1/26  Height:   Ht Readings from Last 1 Encounters:  07/11/20 6' (1.829 m)    Weight:   Wt Readings from Last 1 Encounters:  07/13/20 80.2 kg    BMI:  Body mass index is 23.98 kg/m.  Estimated Nutritional Needs:   Kcal:  2200-2400  Protein:  110-125  Fluid:  >/= 2L   Lajuan Lines, RD, LDN Clinical Nutrition After Hours/Weekend Pager # in [place]

## 2020-07-13 NOTE — Progress Notes (Signed)
[place] for Heparin  Indication: chest pain/ACS  Allergies  Allergen Reactions  . 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 Heparin Other (See Comments)    Reaction:  Bleeding   . Lisinopril Swelling    Patient Measurements: Height: 6' (182.9 cm) Weight: 80.2 kg (176 lb 12.9 oz) IBW/kg (Calculated) : 77.6 Heparin Dosing Weight:  79.4 kg   Vital Signs: Temp: 97.6 F (36.4 C) (01/28 1037) Temp Source: Oral (01/28 1037) BP: 152/74 (01/28 1500) Pulse Rate: 81 (01/28 1500)  Labs: Recent Labs     0000 07/11/20 1605 07/11/20 1606 07/11/20 1710 07/11/20 1843 07/11/20 2037 07/11/20 2238 07/12/20 0052 07/12/20 0308 07/12/20 0914 07/12/20 1851 07/13/20 0249  HGB   < >  --  18.7*  --   --   --   --   --  16.6  --   --  18.3*  HCT  --   --  51.7  --   --   --   --   --  45.6  --   --  50.8  PLT  --   --  200  --   --   --   --   --  172  --   --  166  APTT  --  34  --   --   --   --   --   --   --  84*  --   --   LABPROT  --   --  13.6  --   --   --   --   --   --   --   --   --   INR  --   --  1.1  --   --   --   --   --   --   --   --   --   HEPARINUNFRC  --   --   --    < >  --   --   --   --   --  0.28* 0.35 0.55  CREATININE  --   --  1.44*  --  1.26*  --   --   --   --   --   --  1.22  TROPONINIHS  --   --  62*   < > 107* 103* 89* 89*  --   --   --   --    < > = values in this interval not displayed.    Estimated Creatinine Clearance: 58.3 mL/min (by C-G formula based on SCr of 1.22 mg/dL).   Medical History: Past Medical History:  Diagnosis Date  . 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 Cancer ([place])   . Carotid artery disease (HCC)    > 75% bilateral ICA stenoses on 01/2013 CT  . Chronic pain syndrome   . Coronary artery disease    s/p CABG ~ 2007  . GERD (gastroesophageal reflux disease)   . Heparin allergy    Bleeding  . History of tobacco use   . Hyperlipidemia   . Hypertension   . Ischemic cardiomyopathy   .  Paroxysmal atrial fibrillation (HCC)    On Xarelto anticoagulation  . Type 2 diabetes mellitus (HCC)    A1C 6.8% in 01/2013    Medications:  Heparin Dosing Weight:  79.4 kg  Medications Prior to Admission  Medication Sig Dispense Refill Last Dose  . amLODipine (NORVASC) 5 MG tablet Take 1  tablet (5 mg total) by mouth daily. (Patient taking differently: Take 10 mg by mouth daily.) 30 tablet 0 07/11/2020 at 0830  . ascorbic acid (VITAMIN C) 500 MG tablet Take 500 mg by mouth daily.   07/11/2020 at 0830  . atorvastatin (LIPITOR) 80 MG tablet Take 80 mg by mouth daily.   07/11/2020 at 0830  . chlorthalidone (HYGROTON) 25 MG tablet Take 1 tablet by mouth daily.   07/11/2020 at 0830  . Cholecalciferol (VITAMIN D3) 125 MCG (5000 UT) CAPS Take 5,000 Units by mouth daily.   07/11/2020 at 0830  . empagliflozin (JARDIANCE) 25 MG TABS tablet Take 25 mg by mouth daily.     Marland Kitchen ezetimibe (ZETIA) 10 MG tablet Take 1 tablet (10 mg total) by mouth daily. 30 tablet 0   . gabapentin (NEURONTIN) 100 MG capsule Take 100 mg by mouth 3 (three) times daily.   07/11/2020 at 0830  . glipiZIDE (GLUCOTROL) 10 MG tablet Take 10 mg by mouth 2 (two) times daily before a meal.   07/11/2020 at 0830  . guaiFENesin-dextromethorphan (ROBITUSSIN DM) 100-10 MG/5ML syrup Take 5 mLs by mouth every 4 (four) hours as needed for cough. 118 mL 0 prn at prn  . hydrALAZINE (APRESOLINE) 25 MG tablet Take 25 mg by mouth 3 (three) times daily.   07/11/2020 at 0830  . isosorbide mononitrate (IMDUR) 30 MG 24 hr tablet Take 1 tablet (30 mg total) by mouth daily. 30 tablet 0 07/11/2020 at 0830  . metFORMIN (GLUMETZA) 500 MG (MOD) 24 hr tablet Take 500 mg by mouth 2 (two) times daily with a meal.   07/11/2020 at 0830  . morphine (MS CONTIN) 15 MG 12 hr tablet Take 15 mg by mouth every 12 (twelve) hours as needed for pain.   prn at prn  . omeprazole (PRILOSEC OTC) 20 MG tablet Take 20 mg by mouth daily.   07/11/2020 at 0830  . ondansetron (ZOFRAN) 8 MG tablet  Take 8 mg by mouth every 8 (eight) hours as needed for nausea or vomiting.    prn at prn  . rivaroxaban (XARELTO) 20 MG TABS tablet Take 1 tablet (20 mg total) by mouth daily with supper. 30 tablet 0 07/10/2020 at 1730  . spironolactone (ALDACTONE) 25 MG tablet Take 1 tablet (25 mg total) by mouth daily. 30 tablet 0 07/11/2020 at 0830  . Zinc Sulfate (ZINC 15 PO) Take 1 tablet by mouth daily.   07/11/2020 at 0830    Assessment: Pharmacy consulted to dose heparin in this 75 year old male admitted with ACS/NSTEMI.  CrCl = 79.4 ml/min Pt was on Xarelto 20 mg PO daily at home, last dose was on 1/26 @ 0830.  Pt has heparin listed as allergy with "bleeding" listed as reaction.  Pt has had heparin and lovenox on multiple prior admissions without issue.  MD ok to proceed with heparin.  1/27 @ 3016 - pt experienced bleeding from IV removal - confirmed with nursing @ 1930 bleeding has stopped and heparin gtt still active  1/28 s/p PCI  Date Time HL/aPTT Rate/Comment 1/26 1829 <0.1 / 34s Started 950 units/hr w/o bolus 1/27 0914 0.28 / 84s APTT and HL correlating (xarelto PTA) 1/27 1851 0.35  Therapeutic x1 @ 1100 units/hr 1/28 0249 0.55  Therapeutic x 2  Goal of Therapy:  Heparin level 0.3-0.7 units/ml aPTT 66 - 102  seconds Monitor platelets by anticoagulation protocol: Yes   Plan:  Restart heparin drip at 2030 at a rate of 1100  units/hr with no bolus (8 hours after sheath removal). Will check heparin level 6 hours after infusion start.  Monitor daily CBC, s/s of bleed  Bryan Scott, [facility] 07/13/2020 3:27 PM

## 2020-07-13 NOTE — Brief Op Note (Signed)
BRIEF PCI NOTE  07/13/2020  2:49 PM  PATIENT:  Ladona Horns  75 y.o. male  PRE-OPERATIVE DIAGNOSIS:  NSTEMI, CAD, CABG  POST-OPERATIVE DIAGNOSIS:  NSTEMI, CAD, CABG  PROCEDURE:  Procedure(s): LEFT HEART CATH AND CORS/GRAFTS ANGIOGRAPHY (N/A) CORONARY STENT INTERVENTION (N/A)  SURGEON:  Surgeon(s) and Role: Panel 1:    * Gollan, Kathlene November, MD - Primary Panel 2:    Nelva Bush, MD - Primary  FINDINGS: 1. Severe native CAD, as detailed in Dr. Donivan Scull diagnostic catheterization note. 2. 95% stenosis of SVG-RCA. 3. Successful PCI to SVG-RCA using Resolute Onyx 3.5 x 18 mm DES.  RECOMMENDATIONS: 1. Remove right femoral artery sheath in 2 hours. 2. Clopidogrel 75 mg daily x 12 months.  Restart rivaroxaban tomorrow if no evidence of bleeding/vascular injury. 3. Restart IV heparin 8 hours after femoral sheath removal. 4. Aggressive secondary prevention.  Nelva Bush, MD [facility]

## 2020-07-13 NOTE — Progress Notes (Signed)
[place] for Heparin  Indication: chest pain/ACS  Allergies  Allergen Reactions  . 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 Heparin Other (See Comments)    Reaction:  Bleeding   . Lisinopril Swelling    Patient Measurements: Height: 6' (182.9 cm) Weight: 79.4 kg (175 lb) IBW/kg (Calculated) : 77.6 Heparin Dosing Weight:  79.4 kg   Vital Signs: Temp: 98 F (36.7 C) (01/27 2113) Temp Source: Oral (01/27 2113) BP: 130/67 (01/27 2113) Pulse Rate: 64 (01/27 2113)  Labs: Recent Labs     0000 07/11/20 1605 07/11/20 1606 07/11/20 1710 07/11/20 1843 07/11/20 2037 07/11/20 2238 07/12/20 0052 07/12/20 0308 07/12/20 0914 07/12/20 1851 07/13/20 0249  HGB   < >  --  18.7*  --   --   --   --   --  16.6  --   --  18.3*  HCT  --   --  51.7  --   --   --   --   --  45.6  --   --  50.8  PLT  --   --  200  --   --   --   --   --  172  --   --  166  APTT  --  34  --   --   --   --   --   --   --  84*  --   --   LABPROT  --   --  13.6  --   --   --   --   --   --   --   --   --   INR  --   --  1.1  --   --   --   --   --   --   --   --   --   HEPARINUNFRC  --   --   --    < >  --   --   --   --   --  0.28* 0.35 0.55  CREATININE  --   --  1.44*  --  1.26*  --   --   --   --   --   --  1.22  TROPONINIHS  --   --  62*   < > 107* 103* 89* 89*  --   --   --   --    < > = values in this interval not displayed.    Estimated Creatinine Clearance: 58.3 mL/min (by C-G formula based on SCr of 1.22 mg/dL).   Medical History: Past Medical History:  Diagnosis Date  . 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 Cancer ([place])   . Carotid artery disease (HCC)    > 75% bilateral ICA stenoses on 01/2013 CT  . Chronic pain syndrome   . Coronary artery disease    s/p CABG ~ 2007  . GERD (gastroesophageal reflux disease)   . Heparin allergy    Bleeding  . History of tobacco use   . Hyperlipidemia   . Hypertension   . Ischemic cardiomyopathy   . Paroxysmal  atrial fibrillation (HCC)    On Xarelto anticoagulation  . Type 2 diabetes mellitus (HCC)    A1C 6.8% in 01/2013    Medications:  Heparin Dosing Weight:  79.4 kg  Medications Prior to Admission  Medication Sig Dispense Refill Last Dose  . amLODipine (NORVASC) 5 MG tablet Take 1 tablet (5  mg total) by mouth daily. (Patient taking differently: Take 10 mg by mouth daily.) 30 tablet 0 07/11/2020 at 0830  . ascorbic acid (VITAMIN C) 500 MG tablet Take 500 mg by mouth daily.   07/11/2020 at 0830  . atorvastatin (LIPITOR) 80 MG tablet Take 80 mg by mouth daily.   07/11/2020 at 0830  . chlorthalidone (HYGROTON) 25 MG tablet Take 1 tablet by mouth daily.   07/11/2020 at 0830  . Cholecalciferol (VITAMIN D3) 125 MCG (5000 UT) CAPS Take 5,000 Units by mouth daily.   07/11/2020 at 0830  . empagliflozin (JARDIANCE) 25 MG TABS tablet Take 25 mg by mouth daily.     Marland Kitchen ezetimibe (ZETIA) 10 MG tablet Take 1 tablet (10 mg total) by mouth daily. 30 tablet 0   . gabapentin (NEURONTIN) 100 MG capsule Take 100 mg by mouth 3 (three) times daily.   07/11/2020 at 0830  . glipiZIDE (GLUCOTROL) 10 MG tablet Take 10 mg by mouth 2 (two) times daily before a meal.   07/11/2020 at 0830  . guaiFENesin-dextromethorphan (ROBITUSSIN DM) 100-10 MG/5ML syrup Take 5 mLs by mouth every 4 (four) hours as needed for cough. 118 mL 0 prn at prn  . hydrALAZINE (APRESOLINE) 25 MG tablet Take 25 mg by mouth 3 (three) times daily.   07/11/2020 at 0830  . isosorbide mononitrate (IMDUR) 30 MG 24 hr tablet Take 1 tablet (30 mg total) by mouth daily. 30 tablet 0 07/11/2020 at 0830  . metFORMIN (GLUMETZA) 500 MG (MOD) 24 hr tablet Take 500 mg by mouth 2 (two) times daily with a meal.   07/11/2020 at 0830  . morphine (MS CONTIN) 15 MG 12 hr tablet Take 15 mg by mouth every 12 (twelve) hours as needed for pain.   prn at prn  . omeprazole (PRILOSEC OTC) 20 MG tablet Take 20 mg by mouth daily.   07/11/2020 at 0830  . ondansetron (ZOFRAN) 8 MG tablet Take 8 mg  by mouth every 8 (eight) hours as needed for nausea or vomiting.    prn at prn  . rivaroxaban (XARELTO) 20 MG TABS tablet Take 1 tablet (20 mg total) by mouth daily with supper. 30 tablet 0 07/10/2020 at 1730  . spironolactone (ALDACTONE) 25 MG tablet Take 1 tablet (25 mg total) by mouth daily. 30 tablet 0 07/11/2020 at 0830  . Zinc Sulfate (ZINC 15 PO) Take 1 tablet by mouth daily.   07/11/2020 at 0830    Assessment: Pharmacy consulted to dose heparin in this 75 year old male admitted with ACS/NSTEMI.  CrCl = 79.4 ml/min Pt was on Xarelto 20 mg PO daily at home, last dose was on 1/26 @ 0830.   Pt has heparin listed as allergy with "bleeding" listed as reaction.  Pt has had heparin and lovenox on multiple prior admissions without issue.  MD ok to proceed with heparin.  1/27 @ 1848 - pt experienced bleeding from IV removal - confirmed with nursing @ 1930 bleeding has stopped and heparin gtt still active  Date Time HL/aPTT Rate/Comment 1/26 1829 <0.1 / 34s Started 950 units/hr w/o bolus 1/27 0914 0.28 / 84s APTT and HL correlating (xarelto PTA) 1/27 1851 0.35  Therapeutic x1 @ 1100 units/hr 1/28 0249 0.55  Therapeutic x 2  Goal of Therapy:  Heparin level 0.3-0.7 units/ml aPTT 66 - 102  seconds Monitor platelets by anticoagulation protocol: Yes   Plan:  Despite Xarelto last dose 1/26 0830, pt's aPTT and anti-Xa have begun correlating and were  normal at baseline. Will continue to dose heparin gtt per HL alone.  Therapeutic x 2, Continue infusion rate of 1100 units/hr Recheck HL with tomorrow AM labs. Check CBC daily.  Renda Rolls, PharmD, [facility] 07/13/2020 3:53 AM

## 2020-07-14 DIAGNOSIS — I480 Paroxysmal atrial fibrillation: Secondary | ICD-10-CM | POA: Diagnosis not present

## 2020-07-14 DIAGNOSIS — I25110 Atherosclerotic heart disease of native coronary artery with unstable angina pectoris: Secondary | ICD-10-CM | POA: Diagnosis not present

## 2020-07-14 DIAGNOSIS — I214 Non-ST elevation (NSTEMI) myocardial infarction: Secondary | ICD-10-CM | POA: Diagnosis not present

## 2020-07-14 DIAGNOSIS — N179 Acute kidney failure, unspecified: Secondary | ICD-10-CM | POA: Diagnosis not present

## 2020-07-14 LAB — BASIC METABOLIC PANEL WITH GFR
Anion gap: 16 — ABNORMAL HIGH (ref 5–15)
BUN: 28 mg/dL — ABNORMAL HIGH (ref 8–23)
CO2: 19 mmol/L — ABNORMAL LOW (ref 22–32)
Calcium: 9.8 mg/dL (ref 8.9–10.3)
Chloride: 99 mmol/L (ref 98–111)
Creatinine, Ser: 1.24 mg/dL (ref 0.61–1.24)
GFR, Estimated: >60 mL/min (ref >60)
Glucose, Bld: 197 mg/dL — ABNORMAL HIGH (ref 70–99)
Potassium: 3.8 mmol/L (ref 3.5–5.1)
Sodium: 134 mmol/L — ABNORMAL LOW (ref 135–145)

## 2020-07-14 LAB — CBC
HCT: 52.1 % — ABNORMAL HIGH (ref 39.0–52.0)
Hemoglobin: 18.2 g/dL — ABNORMAL HIGH (ref 13.0–17.0)
MCH: 32.6 pg (ref 26.0–34.0)
MCHC: 34.9 g/dL (ref 30.0–36.0)
MCV: 93.4 fL (ref 80.0–100.0)
Platelets: 183 10*3/uL (ref 150–400)
RBC: 5.58 MIL/uL (ref 4.22–5.81)
RDW: 12 % (ref 11.5–15.5)
WBC: 13.5 10*3/uL — ABNORMAL HIGH (ref 4.0–10.5)
nRBC: 0 % (ref 0.0–0.2)

## 2020-07-14 LAB — HEPARIN LEVEL (UNFRACTIONATED)
Heparin Unfractionated: 0.31 IU/mL (ref 0.30–0.70)
Heparin Unfractionated: 0.4 [IU]/mL (ref 0.30–0.70)

## 2020-07-14 MED ORDER — ISOSORBIDE MONONITRATE ER 30 MG PO TB24
30.0000 mg | ORAL_TABLET | Freq: Every day | ORAL | Status: DC
  Administered 2020-07-14 – 2020-07-21 (×8): 30 mg via ORAL
  Filled 2020-07-14 (×8): qty 1

## 2020-07-14 MED ORDER — SPIRONOLACTONE 25 MG PO TABS
25.0000 mg | ORAL_TABLET | Freq: Every day | ORAL | Status: DC
  Administered 2020-07-14 – 2020-07-21 (×8): 25 mg via ORAL
  Filled 2020-07-14 (×8): qty 1

## 2020-07-14 MED ORDER — ALPRAZOLAM 0.5 MG PO TABS
0.5000 mg | ORAL_TABLET | Freq: Once | ORAL | Status: AC
  Administered 2020-07-15: 0.5 mg via ORAL
  Filled 2020-07-14: qty 1

## 2020-07-14 MED ORDER — CARVEDILOL 3.125 MG PO TABS
3.1250 mg | ORAL_TABLET | Freq: Two times a day (BID) | ORAL | Status: DC
  Administered 2020-07-15 – 2020-07-21 (×11): 3.125 mg via ORAL
  Filled 2020-07-14 (×14): qty 1

## 2020-07-14 MED ORDER — RIVAROXABAN 20 MG PO TABS
20.0000 mg | ORAL_TABLET | Freq: Every day | ORAL | Status: DC
  Administered 2020-07-14 – 2020-07-21 (×8): 20 mg via ORAL
  Filled 2020-07-14 (×8): qty 1

## 2020-07-14 MED ORDER — EZETIMIBE 10 MG PO TABS
10.0000 mg | ORAL_TABLET | Freq: Every day | ORAL | Status: DC
  Administered 2020-07-14 – 2020-07-21 (×8): 10 mg via ORAL
  Filled 2020-07-14 (×8): qty 1

## 2020-07-14 MED ORDER — DIPHENHYDRAMINE HCL 50 MG/ML IJ SOLN
25.0000 mg | Freq: Once | INTRAMUSCULAR | Status: AC
  Administered 2020-07-14: 25 mg via INTRAVENOUS
  Filled 2020-07-14: qty 1

## 2020-07-14 MED ORDER — SODIUM BICARBONATE 650 MG PO TABS
650.0000 mg | ORAL_TABLET | Freq: Three times a day (TID) | ORAL | Status: AC
  Administered 2020-07-15 – 2020-07-16 (×3): 650 mg via ORAL
  Filled 2020-07-14 (×6): qty 1

## 2020-07-14 MED ORDER — METHYLPREDNISOLONE SODIUM SUCC 125 MG IJ SOLR
60.0000 mg | Freq: Once | INTRAMUSCULAR | Status: AC
  Administered 2020-07-14: 60 mg via INTRAVENOUS
  Filled 2020-07-14: qty 2

## 2020-07-14 NOTE — Progress Notes (Signed)
PROGRESS NOTE    Bryan Scott  WVP:[id number] DOB: 01/18/1946 DOA: 07/11/2020 PCP: [name], MD    Brief Narrative:  This 75 years old male with PMH significant for CAD s/p CABG, hypertension, paroxysmal A. fib on anticoagulation, type 2 diabetes, hyperlipidemia and history of bladder cancer admitted with persistent chest pain and some blurred vision.  Patient is admitted for non-STEMI, He underwent left heart cath with PCI.  Patient is started on Xarelto post cath.  Assessment & Plan:   Principal Problem:   NSTEMI (non-ST elevated myocardial infarction) ([place]) Active Problems:   Type 2 diabetes mellitus without complication (HCC)   Hyperlipidemia   Essential hypertension   PAF (paroxysmal atrial fibrillation) (HCC)   Hyponatremia   CAD (coronary artery disease)   Leukocytosis   AKI (acute kidney injury) ([place])   Chest pain sec. to NSTEMI/coronary artery disease Patient with known history of CAD status post CABG He follows up with cardiology at the [facility] EKG does not show any acute findings Initial troponin of 67 -->107->89.  Nuclear stress test back in June 2021 was low risk. Cardiology consulted, patient underwent left heart cath with multivessel CAD.  Successful PCI to SVG. Transitioned from IV heparin to Xarelto.  Patient started on Plavix, continue for at least 12 months.    No aspirin in the setting of allergies.  Continue chronic anticoagulation with Xarelto. Patient cleared from cardiology to be discharged.  Blurry vision/tinnitus No focal neurological findings-low suspicion of CVA Patient had CT head back in March 2021 for dizziness and had paranasal sinus disease which could explain his current symptoms.  MRI brain negative for CVA. monitor symptoms - outpt f/up with ENT/Ophthal  Hyponatremia Suspect due to hypovolemia. Na 132 ->134.  AKI -present on admission and now resolved Likely prerenal given hyponatremia and hemoconcentrated hemoglobin and  hematocrit Continue IV 75 cc NS infusion.  Elevated anion gap -POA and resolved Suspect from hypovolemia. follow with repeat BMP after fluids  Leukocytosis Unclear etiology. No signs of infection Follow repeat CBC in the morning after fluids  essential hypertension -BP minimally elevated.  Diabetes mellitus, type II -Last HbA1C of 8 in 04/2020  History of paroxysmal atrial fibrillation -Hold Xareto while on heparin gtt. Last dose morning of 1/26.   Xarelto resumed post cath as per cardiology  Hyperlipidemia -Continue statin  Pancreatic mass F/up at [facility] - Work up in progress   DVT prophylaxis: Xarelto Code Status: Full code Family Communication: No family at bedside Disposition Plan:   Status is: Inpatient  Remains inpatient appropriate because:Inpatient level of care appropriate due to severity of illness   Dispo: The patient is from: Home              Anticipated d/c is to: Home              Anticipated d/c date is: 2 days              Patient currently is not medically stable to d/c.   Difficult to place patient No   Consultants:   Cardiology  Procedures: Left heart catheterization. Antimicrobials:   Anti-infectives (From admission, onward)   None        Subjective: Patient was seen and examined at bedside.  Overnight events noted.  Patient reports sore throat inflamed lymph glands.  Patient was given Benadryl and Solu-Medrol,  reports feeling slightly better.  Objective: Vitals:   07/14/20 0425 07/14/20 0733 07/14/20 0831 07/14/20 1140  BP: (!) 144/88 (!) 144/88 Marland Kitchen)  145/79 (!) 146/78  Pulse: 71 70 70 70  Resp: 18 19 17 18   Temp: (!) 97.4 F (36.3 C)  98.1 F (36.7 C) 98.2 F (36.8 C)  TempSrc: Oral  Oral Oral  SpO2: 98% 98% 97% 94%  Weight: 82.7 kg     Height:        Intake/Output Summary (Last 24 hours) at 07/14/2020 1424 Last data filed at 07/14/2020 1350 Gross per 24 hour  Intake 728.06 ml  Output 2520 ml  Net -1791.94 ml    Filed Weights   07/11/20 1508 07/13/20 0447 07/14/20 0425  Weight: 79.4 kg 80.2 kg 82.7 kg    Examination:  General exam: Appears calm and comfortable, not in any distress. Respiratory system: Clear to auscultation. Respiratory effort normal. Cardiovascular system: S1 & S2 heard, RRR. No JVD, murmurs, rubs, gallops or clicks. No pedal edema. Gastrointestinal system: Abdomen is nondistended, soft and nontender. No organomegaly or masses felt. Normal bowel sounds heard. Central nervous system: Alert and oriented. No focal neurological deficits. Extremities: Symmetric 5 x 5 power. Skin: No rashes, lesions or ulcers Psychiatry: Judgement and insight appear normal. Mood & affect appropriate.     Data Reviewed: I have personally reviewed following labs and imaging studies  CBC: Recent Labs  Lab 07/11/20 1606 07/12/20 0308 07/13/20 0249 07/14/20 0317  WBC 16.6* 14.9* 10.8* 13.5*  HGB 18.7* 16.6 18.3* 18.2*  HCT 51.7 45.6 50.8 52.1*  MCV 91.2 91.2 90.7 93.4  PLT 200 172 166 [id number]   Basic Metabolic Panel: Recent Labs  Lab 07/11/20 1606 07/11/20 1843 07/13/20 0249 07/14/20 0317  NA 127* 132* 134* 134*  K 3.5 3.6 3.5 3.8  CL 89* 93* 97* 99  CO2 21* 27 25 19*  GLUCOSE 184* 161* 219* 197*  BUN 34* 34* 34* 28*  CREATININE 1.44* 1.26* 1.22 1.24  CALCIUM 10.0 9.5 9.4 9.8   GFR: Estimated Creatinine Clearance: 57.4 mL/min (by C-G formula based on SCr of 1.24 mg/dL). Liver Function Tests: No results for input(s): AST, ALT, ALKPHOS, BILITOT, PROT, ALBUMIN in the last 168 hours. No results for input(s): LIPASE, AMYLASE in the last 168 hours. No results for input(s): AMMONIA in the last 168 hours. Coagulation Profile: Recent Labs  Lab 07/11/20 1606  INR 1.1   Cardiac Enzymes: No results for input(s): CKTOTAL, CKMB, CKMBINDEX, TROPONINI in the last 168 hours. BNP (last 3 results) No results for input(s): PROBNP in the last 8760 hours. HbA1C: No results for input(s):  HGBA1C in the last 72 hours. CBG: Recent Labs  Lab 07/13/20 1100 07/13/20 1513  GLUCAP 190* 183*   Lipid Profile: No results for input(s): CHOL, HDL, LDLCALC, TRIG, CHOLHDL, LDLDIRECT in the last 72 hours. Thyroid Function Tests: No results for input(s): TSH, T4TOTAL, FREET4, T3FREE, THYROIDAB in the last 72 hours. Anemia Panel: No results for input(s): VITAMINB12, FOLATE, FERRITIN, TIBC, IRON, RETICCTPCT in the last 72 hours. Sepsis Labs: No results for input(s): PROCALCITON, LATICACIDVEN in the last 168 hours.  Recent Results (from the past 240 hour(s))  SARS CORONAVIRUS 2 (TAT 6-24 HRS) Nasopharyngeal Nasopharyngeal Swab     Status: None   Collection Time: 07/11/20  6:05 PM   Specimen: Nasopharyngeal Swab  Result Value Ref Range Status   SARS Coronavirus 2 NEGATIVE NEGATIVE Final    Comment: (NOTE) SARS-CoV-2 target nucleic acids are NOT DETECTED.  The SARS-CoV-2 RNA is generally detectable in upper and lower respiratory specimens during the acute phase of infection. Negative results do not preclude SARS-CoV-2  infection, do not rule out co-infections with other pathogens, and should not be used as the sole basis for treatment or other patient management decisions. Negative results must be combined with clinical observations, patient history, and epidemiological information. The expected result is Negative.  Fact Sheet for Patients: SugarRoll.be  Fact Sheet for Healthcare Providers: https://www.woods-mathews.com/  This test is not yet approved or cleared by the [place] FDA and  has been authorized for detection and/or diagnosis of SARS-CoV-2 by FDA under an Emergency Use Authorization (EUA). This EUA will remain  in effect (meaning this test can be used) for the duration of the COVID-19 declaration under Se ction 564(b)(1) of the Act, 21 U.S.C. section 360bbb-3(b)(1), unless the authorization is terminated or revoked  sooner.  Performed at [facility], [place] [address], [place], [place] [address]    Radiology Studies: CARDIAC CATHETERIZATION  Result Date: 07/13/2020  Prox RCA to Dist RCA lesion is 100% stenosed.  Dist Graft lesion is 95% stenosed.  Ost LM to Mid LM lesion is 40% stenosed.  Ost Cx to Prox Cx lesion is 80% stenosed.  Mid Graft lesion is 20% stenosed.  Mid Cx lesion is 100% stenosed.  RPDA lesion is 90% stenosed.  Origin lesion is 100% stenosed.  The left ventricular ejection fraction is greater than 65% by visual estimate.  LV end diastolic pressure is normal.  The left ventricular systolic function is normal.  There is no aortic valve stenosis.    CARDIAC CATHETERIZATION  Addendum Date: 07/13/2020   Conclusions: 1. Multivessel coronary artery and bypass graft disease, as detailed in Dr. Donivan Scull diagnostic catheterization report.  Culprit lesion appears to be 95% stenosis in SVG-RCA. 2. Successful PCI to SVG-RCA using Resolute Onyx 3.5 x 18 mm drug-eluting stent with 0% residual stenosis and TIMI-3 flow. Recommendations: 1. Remove right femoral artery sheath 2 hours after discontinuation of bivalirudin. 2. Restart heparin 8 hours after sheath removal.  Transition to rivaroxaban tomorrow if no evidence of bleeding/vascular complication. 3. Continue clopidogrel for 12 months (defer aspirin in the setting of aspirin allergy on chronic anticoagulation with rivaroxaban). 4. Aggressive secondary prevention. Nelva Bush, MD [facility]   Result Date: 07/13/2020 Conclusions: 1. Multivessel coronary artery and bypass graft disease, as detailed in Dr. Donivan Scull diagnostic catheterization report.  Culprit lesion appears to be 95% stenosis in SVG-RCA. 2. Successful PCI to SVG-RCA using Restolute Onyx 3.5 x 18 mm drug-eluting stent with 0% residual stenosis and TIMI-3 flow. Recommendations: 1. Remove right femoral artery sheath 2 hours after discontinuation of bivalirudin. 2. Restart  heparin 8 hours after sheath removal.  Transition to rivaroxaban tomorrow if no evidence of bleeding/vascular complication. 3. Continue clopidogrel for 12 months (defer aspirin in the setting of aspirin allergy on chronic anticoagulation with rivaroxaban). 4. Aggressive secondary prevention. Nelva Bush, MD [facility]    Scheduled Meds: . ALPRAZolam  0.5 mg Oral Once  . atorvastatin  80 mg Oral Daily  . carvedilol  3.125 mg Oral BID WC  . Chlorhexidine Gluconate Cloth  6 each Topical Daily  . clopidogrel  75 mg Oral Q breakfast  . ezetimibe  10 mg Oral Daily  . isosorbide mononitrate  30 mg Oral Daily  . rivaroxaban  20 mg Oral Daily  . sodium bicarbonate  650 mg Oral TID  . sodium chloride flush  3 mL Intravenous Q12H  . spironolactone  25 mg Oral Daily   Continuous Infusions: . sodium chloride    . sodium chloride 0.9 %  LOS: 1 day    Time spent: 35 mins.    Shawna Clamp, MD [facility]   If 7PM-7AM, please contact night-coverage

## 2020-07-14 NOTE — Plan of Care (Signed)
  Problem: Education: °Goal: Knowledge of General Education information will improve °Description: Including pain rating scale, medication(s)/side effects and non-pharmacologic comfort measures °Outcome: Progressing °  °Problem: Clinical Measurements: °Goal: Diagnostic test results will improve °Outcome: Progressing °  °Problem: Activity: °Goal: Risk for activity intolerance will decrease °Outcome: Progressing °  °Problem: Coping: °Goal: Level of anxiety will decrease °Outcome: Progressing °  °

## 2020-07-14 NOTE — Progress Notes (Signed)
[place] for Heparin  Indication: chest pain/ACS  Allergies  Allergen Reactions  . 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 Heparin Other (See Comments)    Reaction:  Bleeding   . Lisinopril Swelling    Patient Measurements: Height: 6' (182.9 cm) Weight: 80.2 kg (176 lb 12.9 oz) IBW/kg (Calculated) : 77.6 Heparin Dosing Weight:  79.4 kg   Vital Signs: Temp: 97.4 F (36.3 C) (01/29 0425) Temp Source: Oral (01/29 0425) BP: 144/88 (01/29 0425) Pulse Rate: 71 (01/29 0425)  Labs: Recent Labs     0000 07/11/20 1605 07/11/20 1606 07/11/20 1710 07/11/20 1843 07/11/20 2037 07/11/20 2238 07/12/20 0052 07/12/20 0308 07/12/20 0914 07/12/20 1851 07/13/20 0249 07/14/20 0317  HGB   < >  --  18.7*  --   --   --   --   --  16.6  --   --  18.3* 18.2*  HCT   < >  --  51.7  --   --   --   --   --  45.6  --   --  50.8 52.1*  PLT   < >  --  200  --   --   --   --   --  172  --   --  166 183  APTT  --  34  --   --   --   --   --   --   --  84*  --   --   --   LABPROT  --   --  13.6  --   --   --   --   --   --   --   --   --   --   INR  --   --  1.1  --   --   --   --   --   --   --   --   --   --   HEPARINUNFRC  --   --   --    < >  --   --   --   --   --  0.28* 0.35 0.55 0.31  CREATININE   < >  --  1.44*  --  1.26*  --   --   --   --   --   --  1.22 1.24  TROPONINIHS  --   --  62*   < > 107* 103* 89* 89*  --   --   --   --   --    < > = values in this interval not displayed.    Estimated Creatinine Clearance: 57.4 mL/min (by C-G formula based on SCr of 1.24 mg/dL).   Medical History: Past Medical History:  Diagnosis Date  . 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 Cancer ([place])   . Carotid artery disease (HCC)    > 75% bilateral ICA stenoses on 01/2013 CT  . Chronic pain syndrome   . Coronary artery disease    s/p CABG ~ 2007  . GERD (gastroesophageal reflux disease)   . Heparin allergy    Bleeding  . History of tobacco use   .  Hyperlipidemia   . Hypertension   . Ischemic cardiomyopathy   . Paroxysmal atrial fibrillation (HCC)    On Xarelto anticoagulation  . Type 2 diabetes mellitus (HCC)    A1C 6.8% in 01/2013    Medications:  Heparin Dosing Weight:  79.4  kg  Medications Prior to Admission  Medication Sig Dispense Refill Last Dose  . amLODipine (NORVASC) 5 MG tablet Take 1 tablet (5 mg total) by mouth daily. (Patient taking differently: Take 10 mg by mouth daily.) 30 tablet 0 07/11/2020 at 0830  . ascorbic acid (VITAMIN C) 500 MG tablet Take 500 mg by mouth daily.   07/11/2020 at 0830  . atorvastatin (LIPITOR) 80 MG tablet Take 80 mg by mouth daily.   07/11/2020 at 0830  . chlorthalidone (HYGROTON) 25 MG tablet Take 1 tablet by mouth daily.   07/11/2020 at 0830  . Cholecalciferol (VITAMIN D3) 125 MCG (5000 UT) CAPS Take 5,000 Units by mouth daily.   07/11/2020 at 0830  . empagliflozin (JARDIANCE) 25 MG TABS tablet Take 25 mg by mouth daily.     Marland Kitchen ezetimibe (ZETIA) 10 MG tablet Take 1 tablet (10 mg total) by mouth daily. 30 tablet 0   . gabapentin (NEURONTIN) 100 MG capsule Take 100 mg by mouth 3 (three) times daily.   07/11/2020 at 0830  . glipiZIDE (GLUCOTROL) 10 MG tablet Take 10 mg by mouth 2 (two) times daily before a meal.   07/11/2020 at 0830  . guaiFENesin-dextromethorphan (ROBITUSSIN DM) 100-10 MG/5ML syrup Take 5 mLs by mouth every 4 (four) hours as needed for cough. 118 mL 0 prn at prn  . hydrALAZINE (APRESOLINE) 25 MG tablet Take 25 mg by mouth 3 (three) times daily.   07/11/2020 at 0830  . isosorbide mononitrate (IMDUR) 30 MG 24 hr tablet Take 1 tablet (30 mg total) by mouth daily. 30 tablet 0 07/11/2020 at 0830  . metFORMIN (GLUMETZA) 500 MG (MOD) 24 hr tablet Take 500 mg by mouth 2 (two) times daily with a meal.   07/11/2020 at 0830  . morphine (MS CONTIN) 15 MG 12 hr tablet Take 15 mg by mouth every 12 (twelve) hours as needed for pain.   prn at prn  . omeprazole (PRILOSEC OTC) 20 MG tablet Take 20 mg by  mouth daily.   07/11/2020 at 0830  . ondansetron (ZOFRAN) 8 MG tablet Take 8 mg by mouth every 8 (eight) hours as needed for nausea or vomiting.    prn at prn  . rivaroxaban (XARELTO) 20 MG TABS tablet Take 1 tablet (20 mg total) by mouth daily with supper. 30 tablet 0 07/10/2020 at 1730  . spironolactone (ALDACTONE) 25 MG tablet Take 1 tablet (25 mg total) by mouth daily. 30 tablet 0 07/11/2020 at 0830  . Zinc Sulfate (ZINC 15 PO) Take 1 tablet by mouth daily.   07/11/2020 at 0830    Assessment: Pharmacy consulted to dose heparin in this 75 year old male admitted with ACS/NSTEMI.  CrCl = 79.4 ml/min Pt was on Xarelto 20 mg PO daily at home, last dose was on 1/26 @ 0830.  Pt has heparin listed as allergy with "bleeding" listed as reaction.  Pt has had heparin and lovenox on multiple prior admissions without issue.  MD ok to proceed with heparin.  1/27 @ 6387 - pt experienced bleeding from IV removal - confirmed with nursing @ 1930 bleeding has stopped and heparin gtt still active  1/28 s/p PCI  Date Time HL/aPTT Rate/Comment 1/26 1829 <0.1 / 34s Started 950 units/hr w/o bolus 1/27 0914 0.28 / 84s APTT and HL correlating (xarelto PTA) 1/27 1851 0.35  Therapeutic x1 @ 1100 units/hr 1/28 0249 0.55  Therapeutic x 2 1/29 0317 0.31  Therapeutic   Goal of Therapy:  Heparin level  0.3-0.7 units/ml aPTT 66 - 102  seconds Monitor platelets by anticoagulation protocol: Yes   Plan:  Continue heparin drip at a rate of 1100 units/hr.  Will check heparin level in 8 hours to confirm then daily.  Monitor daily CBC, s/s of bleed  Renda Rolls, PharmD, [facility] 07/14/2020 4:42 AM

## 2020-07-14 NOTE — Progress Notes (Signed)
Progress Note  Patient Name: Bryan Scott Date of Encounter: 07/14/2020  [facility]: [facility] Rockey Situ on consultation at [place] current chest pain, rated 4/10 in severity but improved from admission.  S/p LHC with PCI.   Reports mild soreness at the site of his right femoral arteriotomy site with dressing clean, dry, and intact and no ecchymosis noted on exam.  No shortness of breath. No racing heart rate or palpitations.  Discussed Xarelto and Plavix with patient.  During my visit today, he reports that he feels as if his glands are swollen.  States he discussed this with Dr. Dwyane Dee.  I subsequently sent a message to the RN. O2 sats 100%. Received Benadryl earlier this AM. Talking and eating OK. No work of breathing on exam.  Inpatient Medications    Scheduled Meds: . atorvastatin  80 mg Oral Daily  . Chlorhexidine Gluconate Cloth  6 each Topical Daily  . clopidogrel  75 mg Oral Q breakfast  . diphenhydrAMINE  25 mg Intravenous Once  . sodium chloride flush  3 mL Intravenous Q12H   Continuous Infusions: . sodium chloride    . heparin 1,100 Units/hr (07/14/20 0217)  . sodium chloride 0.9 %     PRN Meds: sodium chloride, acetaminophen, midazolam, morphine injection, nitroGLYCERIN, ondansetron (ZOFRAN) IV, sodium chloride flush, zolpidem   Vital Signs    Vitals:   07/13/20 2100 07/14/20 0425 07/14/20 0733 07/14/20 0831  BP: 113/73 (!) 144/88 (!) 144/88 (!) 145/79  Pulse: 82 71 70 70  Resp: 18 18 19 17   Temp: 97.6 F (36.4 C) (!) 97.4 F (36.3 C)  98.1 F (36.7 C)  TempSrc: Oral Oral    SpO2: 99% 98% 98% 97%  Weight:  82.7 kg    Height:        Intake/Output Summary (Last 24 hours) at 07/14/2020 0947 Last data filed at 07/14/2020 0307 Gross per 24 hour  Intake 248.06 ml  Output 1670 ml  Net -1421.94 ml   Last 3 Weights 07/14/2020 07/13/2020 07/11/2020  Weight (lbs) 182 lb 6.4 oz 176 lb 12.9 oz 175 lb  Weight (kg) 82.736 kg  80.2 kg 79.379 kg      Telemetry    SB, NSR.  Most recent.  70s to 27s with earlier rates in the 52s.- Personally Reviewed  ECG    No new tracings- Personally Reviewed  Physical Exam   GEN: No acute distress.   Neck: No JVD Cardiac: RRR, no murmurs, rubs, or gallops.  Right femoral arteriotomy site inspected without signs of ecchymosis or hematoma.  Dressing clean, dry, intact. Respiratory: Clear to auscultation bilaterally. No work of breathing. GI: Soft, nontender, non-distended  MS: No edema; No deformity. Neuro:  Nonfocal  Psych: Normal affect   Labs    High Sensitivity Troponin:   Recent Labs  Lab 07/11/20 1710 07/11/20 1843 07/11/20 2037 07/11/20 2238 07/12/20 0052  TROPONINIHS 100* 107* 103* 89* 89*      Chemistry Recent Labs  Lab 07/11/20 1843 07/13/20 0249 07/14/20 0317  NA 132* 134* 134*  K 3.6 3.5 3.8  CL 93* 97* 99  CO2 27 25 19*  GLUCOSE 161* 219* 197*  BUN 34* 34* 28*  CREATININE 1.26* 1.22 1.24  CALCIUM 9.5 9.4 9.8  GFRNONAA 60* >60 >60  ANIONGAP 12 12 16*     Hematology Recent Labs  Lab 07/12/20 0308 07/13/20 0249 07/14/20 0317  WBC 14.9* 10.8* 13.5*  RBC 5.00 5.60  5.58  HGB 16.6 18.3* 18.2*  HCT 45.6 50.8 52.1*  MCV 91.2 90.7 93.4  MCH 33.2 32.7 32.6  MCHC 36.4* 36.0 34.9  RDW 11.9 11.9 12.0  PLT 172 166 183    BNPNo results for input(s): BNP, PROBNP in the last 168 hours.   DDimer No results for input(s): DDIMER in the last 168 hours.   Radiology    MR BRAIN W WO CONTRAST  Result Date: 07/12/2020 CLINICAL DATA:  Dizziness.  Diplopia.  Visual disturbance. EXAM: MRI HEAD WITHOUT AND WITH CONTRAST TECHNIQUE: Multiplanar, multiecho pulse sequences of the brain and surrounding structures were obtained without and with intravenous contrast. CONTRAST:  41mL GADAVIST GADOBUTROL 1 MMOL/ML IV SOLN COMPARISON:  09/09/2019 FINDINGS: Brain: The brain has a normal appearance without evidence of malformation, atrophy, old or acute small  or large vessel infarction, mass lesion, hemorrhage, hydrocephalus or extra-axial collection. Vascular: Major vessels at the base of the brain show flow. Venous sinuses appear patent. Skull and upper cervical spine: Normal. Sinuses/Orbits: Chronic complete opacification of both maxillary sinuses. Small left mastoid effusion. Orbits appear normal. Other: None significant. IMPRESSION: 1. Normal appearance of the brain itself. No cause of the presenting symptoms is identified. 2. Chronic complete opacification of both maxillary sinuses. Small left mastoid effusion. Electronically Signed   By: Nelson Chimes M.D.   On: 07/12/2020 12:20   CARDIAC CATHETERIZATION  Result Date: 07/13/2020  Prox RCA to Dist RCA lesion is 100% stenosed.  Dist Graft lesion is 95% stenosed.  Ost LM to Mid LM lesion is 40% stenosed.  Ost Cx to Prox Cx lesion is 80% stenosed.  Mid Graft lesion is 20% stenosed.  Mid Cx lesion is 100% stenosed.  RPDA lesion is 90% stenosed.  Origin lesion is 100% stenosed.  The left ventricular ejection fraction is greater than 65% by visual estimate.  LV end diastolic pressure is normal.  The left ventricular systolic function is normal.  There is no aortic valve stenosis.    CARDIAC CATHETERIZATION  Addendum Date: 07/13/2020   Conclusions: 1. Multivessel coronary artery and bypass graft disease, as detailed in Dr. Donivan Scull diagnostic catheterization report.  Culprit lesion appears to be 95% stenosis in SVG-RCA. 2. Successful PCI to SVG-RCA using Resolute Onyx 3.5 x 18 mm drug-eluting stent with 0% residual stenosis and TIMI-3 flow. Recommendations: 1. Remove right femoral artery sheath 2 hours after discontinuation of bivalirudin. 2. Restart heparin 8 hours after sheath removal.  Transition to rivaroxaban tomorrow if no evidence of bleeding/vascular complication. 3. Continue clopidogrel for 12 months (defer aspirin in the setting of aspirin allergy on chronic anticoagulation with rivaroxaban).  4. Aggressive secondary prevention. Nelva Bush, MD [facility]   Result Date: 07/13/2020 Conclusions: 1. Multivessel coronary artery and bypass graft disease, as detailed in Dr. Donivan Scull diagnostic catheterization report.  Culprit lesion appears to be 95% stenosis in SVG-RCA. 2. Successful PCI to SVG-RCA using Restolute Onyx 3.5 x 18 mm drug-eluting stent with 0% residual stenosis and TIMI-3 flow. Recommendations: 1. Remove right femoral artery sheath 2 hours after discontinuation of bivalirudin. 2. Restart heparin 8 hours after sheath removal.  Transition to rivaroxaban tomorrow if no evidence of bleeding/vascular complication. 3. Continue clopidogrel for 12 months (defer aspirin in the setting of aspirin allergy on chronic anticoagulation with rivaroxaban). 4. Aggressive secondary prevention. Nelva Bush, MD [facility]    Cardiac Studies   LHC with PCI 07/13/20  Prox RCA to Dist RCA lesion is 100% stenosed.  Dist Graft lesion is 95%  stenosed.  Ost LM to Mid LM lesion is 40% stenosed.  Ost Cx to Prox Cx lesion is 80% stenosed.  Mid Graft lesion is 20% stenosed.  Mid Cx lesion is 100% stenosed.  RPDA lesion is 90% stenosed.  Origin lesion is 100% stenosed.  The left ventricular ejection fraction is greater than 65% by visual estimate.  LV end diastolic pressure is normal.  The left ventricular systolic function is normal.  There is no aortic valve stenosis. Coronary Diagrams   Diagnostic Dominance: Right      Conclusions: 1. Multivessel coronary artery and bypass graft disease, as detailed in Dr. Donivan Scull diagnostic catheterization report.  Culprit lesion appears to be 95% stenosis in SVG-RCA. 2. Successful PCI to SVG-RCA using Resolute Onyx 3.5 x 18 mm drug-eluting stent with 0% residual stenosis and TIMI-3 flow. Recommendations: 1. Remove right femoral artery sheath 2 hours after discontinuation of bivalirudin. 2. Restart heparin 8 hours after sheath  removal.  Transition to rivaroxaban tomorrow if no evidence of bleeding/vascular complication. 3. Continue clopidogrel for 12 months (defer aspirin in the setting of aspirin allergy on chronic anticoagulation with rivaroxaban). 4. Aggressive secondary prevention.  Echo 09/09/19 1. Left ventricular ejection fraction, by estimation, is 40 to 45%. The  left ventricle has mildly decreased function. The left ventricle has no  regional wall motion abnormalities. Left ventricular diastolic parameters  are consistent with Grade I  diastolic dysfunction (impaired relaxation).  2. Right ventricular systolic function is normal. The right ventricular  size is normal.  3. The mitral valve is normal in structure. Trivial mitral valve  regurgitation. No evidence of mitral stenosis.  4. The aortic valve is normal in structure. Aortic valve regurgitation is  not visualized. No aortic stenosis is present.  5. The inferior vena cava is normal in size with greater than 50%  respiratory variability, suggesting right atrial pressure of 3 mmHg.  Patient Profile     75 y.o. male with history of CAD s/p CABG, recent 07/13/2020 LHC with PCI as detailed above, atrial fibrillation on Xarelto, hypertension, hyperlipidemia, uncontrolled DM2 with complications, and who presented with NSTEMI now s/p catheterization and PCI.  Assessment & Plan    NSTEMI --Reports current chest pain as above.  Also notes mild soreness associated with right femoral arteriotomy site.  No evidence of hematoma or ecchymosis on exam.  History of CAD and prior CABG as above.  3/21 echo as above with EF 40 to 45%, G1 DD.  S/p 07/13/2020 LHC as above with multivessel CAD and bypass graft disease.  Culprit lesion identified as 95% stenosis in SVG-RCA.  Successful PCI performed to SVG-RCA.  Transition from IV heparin to to Xarelto today as no evidence of bleeding/vascular complications.  Discontinued IV heparin.  Started / restarted PTA  Xarelto 20mg  qd.  Daily CBC. Most recent H& H stable.  Continue clopidogrel for at least 12 months.    Discussed bx and need to defer and continue Plavix.   No ASA in the setting of allergy and is on chronic anticoagulation with Xarelto.  Recommended initiation of low-dose beta-blocker BP allows.  Started Coreg 3.125mg  BID.  Nitrates for CP  Start PRN SL nitro as needed  Restart PTA Imdur 30mg  qd  Aggressive secondary prevention.    Continue Lipitor 80 mg daily.    Restart PTA Zetia 10 mg daily.    HFrEF with recovery of EF by most recent [facility] 07/13/2020 --No SOB. RN notified of throat discomfort / swollen glands with details as  above and pt breathing fine on exam with O2 sats stable. 3/21 echo with EF 40 to 45%. EF 65% by recent cardiac catheterization. Euvolemic on exam.   Recommend OP echo to confirm LVEF improvement on cath.  Continue medical management. Escalate GDMT as tolerated.  Agree with Jardiance 25 mg daily.  Restarted spironolactone 25 mg daily.    Recent renal function stable, potassium 3.8.  Started Coreg 3.125 mg twice daily.  Reported allergy to lisinopril.    ACE/ARB recommended given comorbid DM2/ HTN.  Consider OP trial of ARB - will defer for now.  Paroxysmal atrial fibrillation with RVR --NSR. Not on any rate controlling medications.   Started Coreg 3.125mg  BID as above.  Discontinued IV heparin.  Restarted Xarelto 20 mg daily.  HTN --Still not well controlled. Most PTA medications not restarted. On review of PTA meds, was on amlodipine 10 mg daily, chlorthalidone 25 mg daily, spironolactone 25 mg daily, hydralazine 25 mg 3 times daily, Imdur 30 mg daily.  Restarted spironolactone, Imdur.  Started Coreg 3.125 mg twice daily.  Will defer to restarting BP control medications until repeat vital assessment performed to avoid hypotension or sudden BP/HR drops.  HLD, LDL goal below 70 --As above, continue Lipitor 80 mg daily.  Restart  Zetia 10 mg daily.  Pancreatic mass/abdominal pain --Per IM.  Scheduled [facility] at [facility] 07/23/[facility].  As previously noted, biopsy needed need to be delayed, given unable to stop Plavix at this time with recent PCI as above. Discussed with pt.  DM2 with complications -Hemoglobin A1c elevated.  Continue management at the [place].  Glycemic control recommended for risk factor modification. Restart PTA medications per IM.  Anxiety -Per IM. Consider as contributing to his report of swollen glands  Swollen Glands, reported per pt --Per IM. Received benadryl. WBC 13.5.    For questions or updates, please contact [place] Please consult [email or web address] for contact info under        Signed, Arvil Chaco, PA-C  07/14/2020, 9:47 AM

## 2020-07-14 NOTE — Progress Notes (Signed)
Patient has concerns about sore throat and swelling feeling like he throat swelling shut. Very anxious. Contacted MD Dwyane Dee. MD ordered IV 25mg  Benadryl. Patient has not had any medications on allergy list during his hospital stay.

## 2020-07-15 DIAGNOSIS — I214 Non-ST elevation (NSTEMI) myocardial infarction: Secondary | ICD-10-CM | POA: Diagnosis not present

## 2020-07-15 LAB — CBC
HCT: 48.1 % (ref 39.0–52.0)
Hemoglobin: 17.3 g/dL — ABNORMAL HIGH (ref 13.0–17.0)
MCH: 33.1 pg (ref 26.0–34.0)
MCHC: 36 g/dL (ref 30.0–36.0)
MCV: 92 fL (ref 80.0–100.0)
Platelets: 195 10*3/uL (ref 150–400)
RBC: 5.23 MIL/uL (ref 4.22–5.81)
RDW: 11.8 % (ref 11.5–15.5)
WBC: 12.9 10*3/uL — ABNORMAL HIGH (ref 4.0–10.5)
nRBC: 0 % (ref 0.0–0.2)

## 2020-07-15 LAB — COMPREHENSIVE METABOLIC PANEL
ALT: 29 U/L (ref 0–44)
AST: 21 U/L (ref 15–41)
Albumin: 3.6 g/dL (ref 3.5–5.0)
Alkaline Phosphatase: 70 U/L (ref 38–126)
Anion gap: 14 (ref 5–15)
BUN: 39 mg/dL — ABNORMAL HIGH (ref 8–23)
CO2: 17 mmol/L — ABNORMAL LOW (ref 22–32)
Calcium: 9.7 mg/dL (ref 8.9–10.3)
Chloride: 98 mmol/L (ref 98–111)
Creatinine, Ser: 1.28 mg/dL — ABNORMAL HIGH (ref 0.61–1.24)
GFR, Estimated: 59 mL/min — ABNORMAL LOW (ref >60)
Glucose, Bld: 357 mg/dL — ABNORMAL HIGH (ref 70–99)
Potassium: 4.2 mmol/L (ref 3.5–5.1)
Sodium: 129 mmol/L — ABNORMAL LOW (ref 135–145)
Total Bilirubin: 2.2 mg/dL — ABNORMAL HIGH (ref 0.3–1.2)
Total Protein: 7.6 g/dL (ref 6.5–8.1)

## 2020-07-15 LAB — PHOSPHORUS: Phosphorus: 4.1 mg/dL (ref 2.5–4.6)

## 2020-07-15 LAB — MAGNESIUM: Magnesium: 2.2 mg/dL (ref 1.7–2.4)

## 2020-07-15 MED ORDER — MAGIC MOUTHWASH
1.0000 mL | Freq: Four times a day (QID) | ORAL | Status: DC | PRN
  Filled 2020-07-15: qty 10

## 2020-07-15 MED ORDER — DIPHENHYDRAMINE HCL 25 MG PO CAPS
25.0000 mg | ORAL_CAPSULE | Freq: Once | ORAL | Status: AC
  Administered 2020-07-15: 25 mg via ORAL
  Filled 2020-07-15 (×2): qty 1

## 2020-07-15 MED ORDER — SODIUM CHLORIDE 1 G PO TABS
1.0000 g | ORAL_TABLET | Freq: Two times a day (BID) | ORAL | Status: DC
  Administered 2020-07-15 – 2020-07-21 (×12): 1 g via ORAL
  Filled 2020-07-15 (×14): qty 1

## 2020-07-15 MED ORDER — SODIUM CHLORIDE 0.9 % IV BOLUS
500.0000 mL | Freq: Once | INTRAVENOUS | Status: AC
  Administered 2020-07-15: 500 mL via INTRAVENOUS

## 2020-07-15 NOTE — Progress Notes (Signed)
PROGRESS NOTE    Bryan Scott  ZOX:[id number] DOB: 1946-06-04 DOA: 07/11/2020 PCP: [name], MD    Brief Narrative:  This 75 years old male with PMH significant for CAD s/p CABG, hypertension, paroxysmal A. fib on anticoagulation, type 2 diabetes, hyperlipidemia and history of bladder cancer admitted with persistent chest pain and some blurred vision.  Patient is admitted for non-STEMI, He underwent left heart cath with PCI.  Patient is started on Xarelto post cath.  Chest pain has resolved but patient complains of having difficulty swallowing, sore throat and pain.  ENT consulted.  Assessment & Plan:   Principal Problem:   NSTEMI (non-ST elevated myocardial infarction) ([place]) Active Problems:   Type 2 diabetes mellitus without complication (HCC)   Hyperlipidemia   Essential hypertension   PAF (paroxysmal atrial fibrillation) (HCC)   Hyponatremia   CAD (coronary artery disease)   Leukocytosis   AKI (acute kidney injury) ([place])   Chest pain sec. to NSTEMI/coronary artery disease Patient with known history of CAD status post CABG He follows up with cardiology at the [facility] EKG does not show any acute findings Initial troponin of 67 -->107->89.  Nuclear stress test back in June 2021 was low risk. Cardiology consulted, patient underwent left heart cath with multivessel CAD.  Successful PCI to SVG. Transitioned from IV heparin to Xarelto.  Patient started on Plavix, continue for at least 12 months.   No aspirin in the setting of allergies.  Continue chronic anticoagulation with Xarelto. Patient cleared from cardiology to be discharged.  Blurry vision/tinnitus No focal neurological findings-low suspicion of CVA Patient had CT head back in March 2021 for dizziness and had paranasal sinus disease which could explain his current symptoms.  MRI brain negative for CVA. monitor symptoms - outpt f/up with ENT/Ophthal  Hyponatremia Suspect due to hypovolemia. Na 132  ->134->129. Start sodium tablet 1 gm TID  AKI -present on admission and now resolved Likely prerenal given hyponatremia and hemoconcentrated hemoglobin and hematocrit Continue IV 75 cc NS infusion.  Elevated anion gap -POA and resolved Suspect from hypovolemia. follow with repeat BMP after fluids  Leukocytosis Unclear etiology. No signs of infection Follow repeat CBC in the morning after fluids  essential hypertension -BP minimally elevated.  Diabetes mellitus, type II -Last HbA1C of 8 in 04/2020  History of paroxysmal atrial fibrillation -Hold Xareto while on heparin gtt. Last dose morning of 1/26.   Xarelto resumed post cath as per cardiology  Hyperlipidemia -Continue statin  Pancreatic mass F/up at [facility] - Work up in progress   DVT prophylaxis: Xarelto Code Status: Full code Family Communication: No family at bedside Disposition Plan:   Status is: Inpatient  Remains inpatient appropriate because:Inpatient level of care appropriate due to severity of illness   Dispo: The patient is from: Home              Anticipated d/c is to: Home              Anticipated d/c date is: 2 days              Patient currently is not medically stable to d/c.   Difficult to place patient No   Consultants:   Cardiology  Procedures: Left heart catheterization. Antimicrobials:   Anti-infectives (From admission, onward)   None       Subjective: Patient was seen and examined at bedside.  Overnight events noted.  Patient reports difficulty swallowing, sore throat and neck swelling.  Patient has recurrence of pain.  Magic mouthwash started.  ENT consulted.   Objective: Vitals:   07/14/20 1952 07/15/20 0635 07/15/20 0728 07/15/20 1134  BP: 114/76 (!) 154/53 123/80 116/87  Pulse: 77 78 69 (!) 104  Resp: 17 16 18    Temp: 97.7 F (36.5 C) 97.8 F (36.6 C) 98.4 F (36.9 C)   TempSrc: Oral     SpO2: 95% 98% 96% 100%  Weight:  80.7 kg    Height:         Intake/Output Summary (Last 24 hours) at 07/15/2020 1256 Last data filed at 07/15/2020 0940 Gross per 24 hour  Intake 1440 ml  Output 2950 ml  Net -1510 ml   Filed Weights   07/13/20 0447 07/14/20 0425 07/15/20 0635  Weight: 80.2 kg 82.7 kg 80.7 kg    Examination:  General exam: Appears calm and comfortable, not in any distress. Respiratory system: Clear to auscultation. Respiratory effort normal. Cardiovascular system: S1 & S2 heard, RRR. No JVD, murmurs, rubs, gallops or clicks. No pedal edema. Gastrointestinal system: Abdomen is nondistended, soft and nontender. No organomegaly or masses felt. Normal bowel sounds heard. Central nervous system: Alert and oriented. No focal neurological deficits. Extremities: Symmetric 5 x 5 power. Skin: No rashes, lesions or ulcers Psychiatry: Judgement and insight appear normal. Mood & affect appropriate.     Data Reviewed: I have personally reviewed following labs and imaging studies  CBC: Recent Labs  Lab 07/11/20 1606 07/12/20 0308 07/13/20 0249 07/14/20 0317 07/15/20 0444  WBC 16.6* 14.9* 10.8* 13.5* 12.9*  HGB 18.7* 16.6 18.3* 18.2* 17.3*  HCT 51.7 45.6 50.8 52.1* 48.1  MCV 91.2 91.2 90.7 93.4 92.0  PLT 200 172 166 183 025   Basic Metabolic Panel: Recent Labs  Lab 07/11/20 1606 07/11/20 1843 07/13/20 0249 07/14/20 0317 07/15/20 0444  NA 127* 132* 134* 134* 129*  K 3.5 3.6 3.5 3.8 4.2  CL 89* 93* 97* 99 98  CO2 21* 27 25 19* 17*  GLUCOSE 184* 161* 219* 197* 357*  BUN 34* 34* 34* 28* 39*  CREATININE 1.44* 1.26* 1.22 1.24 1.28*  CALCIUM 10.0 9.5 9.4 9.8 9.7  MG  --   --   --   --  2.2  PHOS  --   --   --   --  4.1   GFR: Estimated Creatinine Clearance: 55.6 mL/min (A) (by C-G formula based on SCr of 1.28 mg/dL (H)). Liver Function Tests: Recent Labs  Lab 07/15/20 0444  AST 21  ALT 29  ALKPHOS 70  BILITOT 2.2*  PROT 7.6  ALBUMIN 3.6   No results for input(s): LIPASE, AMYLASE in the last 168 hours. No  results for input(s): AMMONIA in the last 168 hours. Coagulation Profile: Recent Labs  Lab 07/11/20 1606  INR 1.1   Cardiac Enzymes: No results for input(s): CKTOTAL, CKMB, CKMBINDEX, TROPONINI in the last 168 hours. BNP (last 3 results) No results for input(s): PROBNP in the last 8760 hours. HbA1C: No results for input(s): HGBA1C in the last 72 hours. CBG: Recent Labs  Lab 07/13/20 1100 07/13/20 1513  GLUCAP 190* 183*   Lipid Profile: No results for input(s): CHOL, HDL, LDLCALC, TRIG, CHOLHDL, LDLDIRECT in the last 72 hours. Thyroid Function Tests: No results for input(s): TSH, T4TOTAL, FREET4, T3FREE, THYROIDAB in the last 72 hours. Anemia Panel: No results for input(s): VITAMINB12, FOLATE, FERRITIN, TIBC, IRON, RETICCTPCT in the last 72 hours. Sepsis Labs: No results for input(s): PROCALCITON, LATICACIDVEN in the last 168 hours.  Recent Results (from  the past 240 hour(s))  SARS CORONAVIRUS 2 (TAT 6-24 HRS) Nasopharyngeal Nasopharyngeal Swab     Status: None   Collection Time: 07/11/20  6:05 PM   Specimen: Nasopharyngeal Swab  Result Value Ref Range Status   SARS Coronavirus 2 NEGATIVE NEGATIVE Final    Comment: (NOTE) SARS-CoV-2 target nucleic acids are NOT DETECTED.  The SARS-CoV-2 RNA is generally detectable in upper and lower respiratory specimens during the acute phase of infection. Negative results do not preclude SARS-CoV-2 infection, do not rule out co-infections with other pathogens, and should not be used as the sole basis for treatment or other patient management decisions. Negative results must be combined with clinical observations, patient history, and epidemiological information. The expected result is Negative.  Fact Sheet for Patients: SugarRoll.be  Fact Sheet for Healthcare Providers: https://www.woods-mathews.com/  This test is not yet approved or cleared by the [place] FDA and  has been  authorized for detection and/or diagnosis of SARS-CoV-2 by FDA under an Emergency Use Authorization (EUA). This EUA will remain  in effect (meaning this test can be used) for the duration of the COVID-19 declaration under Se ction 564(b)(1) of the Act, 21 U.S.C. section 360bbb-3(b)(1), unless the authorization is terminated or revoked sooner.  Performed at [facility], [place] [address], [place], [place] [address]    Radiology Studies: CARDIAC CATHETERIZATION  Result Date: 07/13/2020  Prox RCA to Dist RCA lesion is 100% stenosed.  Dist Graft lesion is 95% stenosed.  Ost LM to Mid LM lesion is 40% stenosed.  Ost Cx to Prox Cx lesion is 80% stenosed.  Mid Graft lesion is 20% stenosed.  Mid Cx lesion is 100% stenosed.  RPDA lesion is 90% stenosed.  Origin lesion is 100% stenosed.  The left ventricular ejection fraction is greater than 65% by visual estimate.  LV end diastolic pressure is normal.  The left ventricular systolic function is normal.  There is no aortic valve stenosis.    CARDIAC CATHETERIZATION  Addendum Date: 07/13/2020   Conclusions: 1. Multivessel coronary artery and bypass graft disease, as detailed in Dr. Donivan Scull diagnostic catheterization report.  Culprit lesion appears to be 95% stenosis in SVG-RCA. 2. Successful PCI to SVG-RCA using Resolute Onyx 3.5 x 18 mm drug-eluting stent with 0% residual stenosis and TIMI-3 flow. Recommendations: 1. Remove right femoral artery sheath 2 hours after discontinuation of bivalirudin. 2. Restart heparin 8 hours after sheath removal.  Transition to rivaroxaban tomorrow if no evidence of bleeding/vascular complication. 3. Continue clopidogrel for 12 months (defer aspirin in the setting of aspirin allergy on chronic anticoagulation with rivaroxaban). 4. Aggressive secondary prevention. Nelva Bush, MD [facility]   Result Date: 07/13/2020 Conclusions: 1. Multivessel coronary artery and bypass graft disease, as detailed in  Dr. Donivan Scull diagnostic catheterization report.  Culprit lesion appears to be 95% stenosis in SVG-RCA. 2. Successful PCI to SVG-RCA using Restolute Onyx 3.5 x 18 mm drug-eluting stent with 0% residual stenosis and TIMI-3 flow. Recommendations: 1. Remove right femoral artery sheath 2 hours after discontinuation of bivalirudin. 2. Restart heparin 8 hours after sheath removal.  Transition to rivaroxaban tomorrow if no evidence of bleeding/vascular complication. 3. Continue clopidogrel for 12 months (defer aspirin in the setting of aspirin allergy on chronic anticoagulation with rivaroxaban). 4. Aggressive secondary prevention. Nelva Bush, MD [facility]    Scheduled Meds: . ALPRAZolam  0.5 mg Oral Once  . atorvastatin  80 mg Oral Daily  . carvedilol  3.125 mg Oral BID WC  .  Chlorhexidine Gluconate Cloth  6 each Topical Daily  . clopidogrel  75 mg Oral Q breakfast  . ezetimibe  10 mg Oral Daily  . isosorbide mononitrate  30 mg Oral Daily  . rivaroxaban  20 mg Oral Daily  . sodium bicarbonate  650 mg Oral TID  . sodium chloride flush  3 mL Intravenous Q12H  . sodium chloride  1 g Oral BID WC  . spironolactone  25 mg Oral Daily   Continuous Infusions: . sodium chloride    . sodium chloride 0.9 %       LOS: 2 days    Time spent: 25 mins.    Shawna Clamp, MD [facility]   If 7PM-7AM, please contact night-coverage

## 2020-07-15 NOTE — Plan of Care (Signed)
  Problem: Education: Goal: Knowledge of General Education information will improve Description: Including pain rating scale, medication(s)/side effects and non-pharmacologic comfort measures Outcome: Progressing   Problem: Coping: Goal: Level of anxiety will decrease Outcome: Not Progressing

## 2020-07-15 NOTE — Evaluation (Signed)
Occupational Therapy Evaluation Patient Details Name: Bryan Scott MRN: [id number] DOB: 1945-07-04 Today's Date: 07/15/2020    History of Present Illness 75 yo male with PMH of CAD s/p CABG, hypertension, paroxysmal A. fib on anticoagulation, type 2 diabetes, hyperlipidemia and history of bladder cancer admitted with persistent chest pain and some blurred vision.  Patient is admitted for non-STEMI, He underwent left heart cath with PCI.   Clinical Impression   Pt seen for OT evaluation this date. Upon arrival to room pt awake, seated upright in bed. Pt reported being MOD-INDEPENDENT in all ADLs and functional mobility with rollator prior to admission. Pt reports that he has been living in a motel (first floor, no steps to enter, tub/shower with no grab bars) for 64-month; pt did not want to elaborate on this and stated that he plans to return to his 1-level home (with 8 steps to enter) on Feb 1st. Pt appearing down, becoming tearful in session, and stating that he did not want to participate in functional mobility/seated ADLs; OT provided comfort and provided education on the importance of OOB mobility, with pt agreeable to bed-level ADLs. Pt currently requires MIN A for bed-level LB dressing, with pt appearing fatigued and rating the task as "heavy work". Pt would benefit from skilled OT services to improve safety awareness, balance, strength, and activity tolerance, and maximize return to PLOF. Upon discharge, recommend SNF.     Follow Up Recommendations  SNF    Equipment Recommendations  Other (comment) (defer to next venue of care)       Precautions / Restrictions Precautions Precautions: None Restrictions Weight Bearing Restrictions: No      Mobility Bed Mobility               General bed mobility comments: Pt deferred, however RN reported that he was sitting EOB independently this AM                 Balance  unable to assess                                          ADL either performed or assessed with clinical judgement   ADL Overall ADL's : Needs assistance/impaired                     Lower Body Dressing: Minimal assistance;Bed level Lower Body Dressing Details (indicate cue type and reason): To don/doff socks               General ADL Comments: Pt agreeable to only bed-level ADLs; expressed that donning/doffing socks at bed-level felt like heavy work     Vision Baseline Vision/History: Wears glasses Wears Glasses: Reading only              Pertinent Vitals/Pain Pain Assessment: No/denies pain     Hand Dominance Right   Extremity/Trunk Assessment Upper Extremity Assessment Upper Extremity Assessment: Generalized weakness   Lower Extremity Assessment Lower Extremity Assessment: Generalized weakness       Communication Communication Communication: No difficulties   Cognition Arousal/Alertness: Awake/alert Behavior During Therapy: Anxious Overall Cognitive Status: Impaired/Different from baseline Area of Impairment: Safety/judgement                         Safety/Judgement: Decreased awareness of deficits     General Comments: Pt appearing down, becoming tearful in  session, and stating that he did not want to participate in functional mobility/seated ADLs; OT provided comfort and provided education on the importance of OOB mobility, with pt agreeable to bed-level ADLs. Pt A&Ox4, however demonstrates decrease awareness of deficits; labeled his participation bed-level ADLs as "feeling like heavy work", however stated that he will be able to complete cooking/household maintenance independently.              Home Living Family/patient expects to be discharged to:: Private residence Living Arrangements: Alone   Type of Home: House Home Access: Stairs to enter Technical brewer of Steps: 8 Entrance Stairs-Rails: Can reach both [place]: One level     Bathroom Shower/Tub:  Occupational psychologist: [place]: Environmental consultant - 4 wheels   Additional Comments: Pt reports that he has been living in a motel (first floor, no steps to enter, tub/shower with no grab bars) for 69-month; did not want to elaborate on this and stated that he plans to return to his home on Feb 1st      Prior Functioning/Environment Level of Independence: Independent with assistive device(s)        Comments: Uses a rollator for functional mobility and endorses falling frequently. Pt reports completing ADLs/IADLs independently. 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 Problem List: Decreased strength;Decreased activity tolerance;Impaired balance (sitting and/or standing);Decreased safety awareness;Cardiopulmonary status limiting activity      OT Treatment/Interventions: Self-care/ADL training;Therapeutic exercise;Energy conservation;DME and/or AE instruction;Therapeutic activities;Patient/family education;Balance training    OT Goals(Current goals can be found in the care plan section) Acute Rehab OT Goals Patient Stated Goal: to go home OT Goal Formulation: With patient Time For Goal Achievement: 07/29/20 Potential to Achieve Goals: Fair ADL Goals Pt Will Perform Grooming: with supervision;standing Pt Will Perform Lower Body Bathing: with supervision;sit to/from stand Pt Will Transfer to Toilet: with supervision;ambulating;bedside commode  OT Frequency: Min 1X/week   Barriers to D/C: Decreased caregiver support             AM-PAC OT "6 Clicks" Daily Activity     Outcome Measure Help from another person eating meals?: None Help from another person taking care of personal grooming?: A Little Help from another person toileting, which includes using toliet, bedpan, or urinal?: A Little Help from another person bathing (including washing, rinsing, drying)?: A Lot Help from another person to put on and taking off regular upper body clothing?:  None Help from another person to put on and taking off regular lower body clothing?: A Lot 6 Click Score: 18   End of Session Nurse Communication: Mobility status  Activity Tolerance: Patient tolerated treatment well Patient left: in bed;with call bell/phone within reach;with bed alarm set  OT Visit Diagnosis: Unsteadiness on feet (R26.81);History of falling (Z91.81);Muscle weakness (generalized) (M62.81)                Time: 0955-1010 OT Time Calculation (min): 15 min Charges:  OT General Charges $OT Visit: 1 Visit OT Evaluation $OT Eval Moderate Complexity: Ferris Piffard, OTR/L [place]

## 2020-07-15 NOTE — Progress Notes (Signed)
Patient on side of bed with breakfast tray. States he cannot eat due to trouble swallowing. States he feels "swelling in throat". MD has ordered benadryl. Patient anxious and tearful. RN provided reassurance to patient. Offered patient ice cream or apple sauce to eat. Patient denise further needs at this time.

## 2020-07-15 NOTE — Consult Note (Signed)
Bryan Scott, Bryan Scott [id number] 1946/04/23 Shawna Clamp, MD  Reason for Consult: Sore throat and neck swelling  HPI: Mr. Bryan Scott was admitted for cardiac work-up eventually ended up with cardiac catheterization.  After completion of this he had complained of sore throat and neck swelling.  He has a history of questionable angioedema in 2010 and 2014 1 admission ICU stay.  He has a history of allergy to aspirin and ACE inhibitor's.  He is currently taking neither.  Complains of intermittent mild sore throat and dysphagia has normal voice.  He feels like his neck is swollen bilaterally in the region of the submandibular glands.  Allergies:  Allergies  Allergen Reactions  . 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 Heparin Other (See Comments)    Reaction:  Bleeding   . Lisinopril Swelling    ROS: Review of systems normal other than 12 systems except per HPI.  PMH:  Past Medical History:  Diagnosis Date  . 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 Cancer ([place])   . Carotid artery disease (HCC)    > 75% bilateral ICA stenoses on 01/2013 CT  . Chronic pain syndrome   . Coronary artery disease    s/p CABG ~ 2007  . GERD (gastroesophageal reflux disease)   . Heparin allergy    Bleeding  . History of tobacco use   . Hyperlipidemia   . Hypertension   . Ischemic cardiomyopathy   . Paroxysmal atrial fibrillation (HCC)    On Xarelto anticoagulation  . Type 2 diabetes mellitus (HCC)    A1C 6.8% in 01/2013    FH:  Family History  Problem Relation Age of Onset  . Heart disease Mother 66       Deceased  . CVA Mother   . Seizures Father 3       Deceased from complications with seizures    SH:  Social History   Socioeconomic History  . Marital status: Married    Spouse name: Not on file  . Number of children: Not on file  . Years of education: Not on file  . Highest education level: Not on file  Occupational History  . Not on file  Tobacco Use  . Smoking status: Former Smoker    Packs/day:  1.00    Years: 30.00    Pack years: 30.00    Types: Cigarettes  . Smokeless tobacco: Never Used  . Tobacco comment: 30 pack-year history. Quit 8-9 years ago.   Substance and Sexual Activity  . Alcohol use: No  . Drug use: No  . Sexual activity: Not on file  Other Topics Concern  . Not on file  Social History Narrative  . Not on file   Social Determinants of Health   Financial Resource Strain: Not on file  Food Insecurity: Not on file  Transportation Needs: Not on file  Physical Activity: Not on file  Stress: Not on file  Social Connections: Not on file  Intimate Partner Violence: Not on file    PSH:  Past Surgical History:  Procedure Laterality Date  . CARDIAC CATHETERIZATION    . CORONARY ARTERY BYPASS GRAFT  2007    Physical  Exam: Patient awake and alert.  I had him sit up in the bed.  The external nose and ears are clear anterior nose is pale oral cavity oropharynx tongue floor mouth are all clear.  No evidence of angioedema no evidence of swelling or infection.  Palpation of the neck showed no evidence of acute  swelling or adenopathy.  He does have bilateral sclerotic submandibular gland consistent with chronic dehydration and dry mouth.  Procedure: After the patient's consent a topical anesthetic of phenylephrine lidocaine was administered through each nostril.  A total of 1 and half cc was used.  At approximately 5 minutes a flexible fiberoptic laryngoscope was introduced to the right nostril.  There was a right septal deviation therefore the left nostril was examined.  The scope was passed easily into the nasopharynx examination of the tongue base, epiglottis, larynx and immediate post glottic space were all within normal limits.  The scope was removed without difficulty.  A/P:1. Intermittent sore throat and dry mouth like related to multiple medications which are drying.  Advised patient to use sour balls to promote salivary flow to increase hydration.  There is no  evidence of acute neck mass and no evidence of angioedema.  He has intermittent dysphagia to solids this is possibly an esophageal stricture, however this can easily be worked up as an outpatient.  From an ENT standpoint no reason this patient cannot be discharged home as soon as his cardiac clearance is completed.  Would recommend follow-up with his primary care or gastroenterologist as an outpatient for work-up of his mild dysphagia.  Recommend avoid ACE inhibitor's any ARB drugs to prevent further angioedema.  2.  Bilateral chronic maxillary sinusitis-he has had multiple scans in the past that showed chronic maxillary sinusitis I reviewed those today.  He is totally asymptomatic no further work-up needed at this point.  He can follow-up as an outpatient with [facility] should any maxillary symptoms arise.  Roena Malady 07/15/2020 1:56 PM

## 2020-07-16 ENCOUNTER — Encounter: Payer: Self-pay | Admitting: Cardiovascular Disease

## 2020-07-16 DIAGNOSIS — I214 Non-ST elevation (NSTEMI) myocardial infarction: Secondary | ICD-10-CM | POA: Diagnosis not present

## 2020-07-16 LAB — BASIC METABOLIC PANEL
Anion gap: 13 (ref 5–15)
BUN: 43 mg/dL — ABNORMAL HIGH (ref 8–23)
CO2: 20 mmol/L — ABNORMAL LOW (ref 22–32)
Calcium: 9.8 mg/dL (ref 8.9–10.3)
Chloride: 96 mmol/L — ABNORMAL LOW (ref 98–111)
Creatinine, Ser: 1.29 mg/dL — ABNORMAL HIGH (ref 0.61–1.24)
GFR, Estimated: 58 mL/min — ABNORMAL LOW (ref >60)
Glucose, Bld: 326 mg/dL — ABNORMAL HIGH (ref 70–99)
Potassium: 3.9 mmol/L (ref 3.5–5.1)
Sodium: 129 mmol/L — ABNORMAL LOW (ref 135–145)

## 2020-07-16 LAB — GLUCOSE, CAPILLARY
Glucose-Capillary: 355 mg/dL — ABNORMAL HIGH (ref 70–99)
Glucose-Capillary: 313 mg/dL — ABNORMAL HIGH (ref 70–99)

## 2020-07-16 LAB — CBC
HCT: 48 % (ref 39.0–52.0)
Hemoglobin: 17 g/dL (ref 13.0–17.0)
MCH: 32.4 pg (ref 26.0–34.0)
MCHC: 35.4 g/dL (ref 30.0–36.0)
MCV: 91.6 fL (ref 80.0–100.0)
Platelets: 189 10*3/uL (ref 150–400)
RBC: 5.24 MIL/uL (ref 4.22–5.81)
RDW: 11.9 % (ref 11.5–15.5)
WBC: 13 10*3/uL — ABNORMAL HIGH (ref 4.0–10.5)
nRBC: 0 % (ref 0.0–0.2)

## 2020-07-16 LAB — PHOSPHORUS: Phosphorus: 2.7 mg/dL (ref 2.5–4.6)

## 2020-07-16 LAB — MAGNESIUM: Magnesium: 2 mg/dL (ref 1.7–2.4)

## 2020-07-16 LAB — HEMOGLOBIN A1C
Hgb A1c MFr Bld: 9.5 % — ABNORMAL HIGH (ref 4.8–5.6)
Mean Plasma Glucose: 225.95 mg/dL

## 2020-07-16 MED ORDER — ALPRAZOLAM 0.5 MG PO TABS
0.5000 mg | ORAL_TABLET | Freq: Once | ORAL | Status: AC
  Administered 2020-07-16: 0.5 mg via ORAL
  Filled 2020-07-16: qty 1

## 2020-07-16 MED ORDER — LOPERAMIDE HCL 2 MG PO CAPS
2.0000 mg | ORAL_CAPSULE | ORAL | Status: DC | PRN
  Administered 2020-07-16: 2 mg via ORAL
  Filled 2020-07-16: qty 1

## 2020-07-16 MED ORDER — ADULT MULTIVITAMIN W/MINERALS CH
1.0000 | ORAL_TABLET | Freq: Every day | ORAL | Status: DC
  Administered 2020-07-17 – 2020-07-21 (×5): 1 via ORAL
  Filled 2020-07-16 (×6): qty 1

## 2020-07-16 MED ORDER — INSULIN GLARGINE 100 UNIT/ML ~~LOC~~ SOLN
10.0000 [IU] | Freq: Every day | SUBCUTANEOUS | Status: DC
  Administered 2020-07-16: 10 [IU] via SUBCUTANEOUS
  Filled 2020-07-16 (×2): qty 0.1

## 2020-07-16 MED ORDER — ENSURE MAX PROTEIN PO LIQD
11.0000 [oz_av] | Freq: Two times a day (BID) | ORAL | Status: DC
  Administered 2020-07-16 – 2020-07-17 (×3): 11 [oz_av] via ORAL
  Administered 2020-07-18: 237 mL via ORAL
  Administered 2020-07-19 – 2020-07-21 (×4): 11 [oz_av] via ORAL
  Filled 2020-07-16: qty 330

## 2020-07-16 MED ORDER — INSULIN ASPART 100 UNIT/ML ~~LOC~~ SOLN
0.0000 [IU] | Freq: Three times a day (TID) | SUBCUTANEOUS | Status: DC
  Administered 2020-07-16: 15 [IU] via SUBCUTANEOUS
  Administered 2020-07-17: 8 [IU] via SUBCUTANEOUS
  Administered 2020-07-17 (×2): 11 [IU] via SUBCUTANEOUS
  Administered 2020-07-18: 5 [IU] via SUBCUTANEOUS
  Administered 2020-07-18: 8 [IU] via SUBCUTANEOUS
  Administered 2020-07-18: 5 [IU] via SUBCUTANEOUS
  Administered 2020-07-19: 3 [IU] via SUBCUTANEOUS
  Administered 2020-07-19: 5 [IU] via SUBCUTANEOUS
  Administered 2020-07-19: 8 [IU] via SUBCUTANEOUS
  Administered 2020-07-20: 11 [IU] via SUBCUTANEOUS
  Administered 2020-07-20 – 2020-07-21 (×2): 3 [IU] via SUBCUTANEOUS
  Filled 2020-07-16 (×13): qty 1

## 2020-07-16 MED ORDER — INSULIN ASPART 100 UNIT/ML ~~LOC~~ SOLN
0.0000 [IU] | Freq: Every day | SUBCUTANEOUS | Status: DC
  Administered 2020-07-16: 4 [IU] via SUBCUTANEOUS
  Administered 2020-07-19: 2 [IU] via SUBCUTANEOUS
  Administered 2020-07-20: 4 [IU] via SUBCUTANEOUS
  Filled 2020-07-16 (×2): qty 1

## 2020-07-16 NOTE — Progress Notes (Signed)
PROGRESS NOTE    Bryan Scott  WFU:[id number] DOB: 07-04-1945 DOA: 07/11/2020 PCP: [name], MD    Brief Narrative:  This 75 years old male with PMH significant for CAD s/p CABG, hypertension, paroxysmal A. fib on anticoagulation, type 2 diabetes, hyperlipidemia and history of bladder cancer admitted with persistent chest pain and some blurred vision.  Patient is admitted for non-STEMI, He underwent left heart cath with PCI.  Patient is started on Plavix and Xarelto post cath.  Chest pain has resolved but patient complains of having difficulty swallowing, sore throat and pain.  ENT consulted.  No acute intervention needed at this time.  Follow-up outpatient ENT and GI for dysphagia.  Assessment & Plan:   Principal Problem:   NSTEMI (non-ST elevated myocardial infarction) ([place]) Active Problems:   Type 2 diabetes mellitus without complication (HCC)   Hyperlipidemia   Essential hypertension   PAF (paroxysmal atrial fibrillation) (HCC)   Hyponatremia   CAD (coronary artery disease)   Leukocytosis   AKI (acute kidney injury) ([place])   Chest pain sec. to NSTEMI / Coronary artery disease Patient with known history of CAD status post CABG He follows up [facility] EKG does not show any acute findings Initial troponin of 67 -->107->89.  Nuclear stress test back in June 2021 was low risk. Cardiology consulted, patient underwent left heart cath with multivessel CAD.  Successful PCI to SVG. Transitioned from IV heparin to Xarelto.  Patient started on Plavix, continue for at least 12 months.   No aspirin in the setting of allergies.  Continue chronic anticoagulation with Xarelto. Patient cleared from cardiology to be discharged.  Blurry vision/tinnitus No focal Neurological findings - Low suspicion of CVA. Patient had CT head back in March 2021 for dizziness and had paranasal sinus disease which could explain his current symptoms.  MRI brain negative for CVA. monitor  symptoms - outpt f/up with ENT/Ophthal  Hyponatremia:  Suspect due to hypovolemia. Na 132 ->134->129. Start sodium tablet 1 gm TID  AKI -present on admission and now resolved. Likely prerenal given hyponatremia and hemoconcentrated hemoglobin and hematocrit Continue IV 75 cc NS infusion.  Elevated anion gap -POA and resolved.  Suspect from hypovolemia. follow with repeat BMP after fluids.   Leukocytosis Unclear etiology. No signs of infection Follow repeat CBC in the morning after fluids  Essential hypertension -BP minimally elevated.  Diabetes mellitus, type II -Last HbA1C of 8 in 04/2020. Continue regular Insulin sliding scale.  History of paroxysmal atrial fibrillation -Hold Xareto while on heparin gtt. Last dose morning of 1/26.   Xarelto resumed post cath as per cardiology  Hyperlipidemia -Continue statin  Pancreatic mass F/up at [facility] - Work up in progress   DVT prophylaxis: Xarelto Code Status: Full code Family Communication: No family at bedside Disposition Plan:   Status is: Inpatient  Remains inpatient appropriate because:Inpatient level of care appropriate due to severity of illness   Dispo: The patient is from: Home              Anticipated d/c is to:  SNF              Anticipated d/c date is:1 day.              Patient currently is not medically stable to d/c.   Difficult to place patient No   Consultants:   Cardiology  Procedures: Left heart catheterization. Antimicrobials:   Anti-infectives (From admission, onward)   None  Subjective: Patient was seen and examined at bedside.  Overnight events noted.  Patient reports feeling better,    Patient reports feeling generalized weakness , feels tired , reports sore throat has improved.   Objective: Vitals:   07/15/20 1651 07/15/20 2105 07/16/20 0313 07/16/20 0905  BP: (!) 96/56 122/79 121/75 (!) 112/100  Pulse: 75 73 64 71  Resp:  18 18 18   Temp:  98.2 F (36.8 C) (!)  97.5 F (36.4 C) 97.8 F (36.6 C)  TempSrc:   Oral Oral  SpO2: 96% 100% 95% 96%  Weight:   80.2 kg   Height:        Intake/Output Summary (Last 24 hours) at 07/16/2020 1413 Last data filed at 07/16/2020 0500 Gross per 24 hour  Intake 1111.67 ml  Output 1100 ml  Net 11.67 ml   Filed Weights   07/14/20 0425 07/15/20 0635 07/16/20 0313  Weight: 82.7 kg 80.7 kg 80.2 kg    Examination:  General exam: Appears calm and comfortable, not in any distress. Respiratory system: Clear to auscultation. Respiratory effort normal. Cardiovascular system: S1 & S2 heard, RRR. No JVD, murmurs, rubs, gallops or clicks. No pedal edema. Gastrointestinal system: Abdomen is nondistended, soft and nontender. No organomegaly or masses felt. Normal bowel sounds heard. Central nervous system: Alert and oriented. No focal neurological deficits. Extremities: Symmetric 5 x 5 power. Skin: No rashes, lesions or ulcers Psychiatry: Judgement and insight appear normal. Mood & affect appropriate.     Data Reviewed: I have personally reviewed following labs and imaging studies  CBC: Recent Labs  Lab 07/12/20 0308 07/13/20 0249 07/14/20 0317 07/15/20 0444 07/16/20 0440  WBC 14.9* 10.8* 13.5* 12.9* 13.0*  HGB 16.6 18.3* 18.2* 17.3* 17.0  HCT 45.6 50.8 52.1* 48.1 48.0  MCV 91.2 90.7 93.4 92.0 91.6  PLT 172 166 183 195 [id number]   Basic Metabolic Panel: Recent Labs  Lab 07/11/20 1843 07/13/20 0249 07/14/20 0317 07/15/20 0444 07/16/20 0440  NA 132* 134* 134* 129* 129*  K 3.6 3.5 3.8 4.2 3.9  CL 93* 97* 99 98 96*  CO2 27 25 19* 17* 20*  GLUCOSE 161* 219* 197* 357* 326*  BUN 34* 34* 28* 39* 43*  CREATININE 1.26* 1.22 1.24 1.28* 1.29*  CALCIUM 9.5 9.4 9.8 9.7 9.8  MG  --   --   --  2.2 2.0  PHOS  --   --   --  4.1 2.7   GFR: Estimated Creatinine Clearance: 55.1 mL/min (A) (by C-G formula based on SCr of 1.29 mg/dL (H)). Liver Function Tests: Recent Labs  Lab 07/15/20 0444  AST 21  ALT 29   ALKPHOS 70  BILITOT 2.2*  PROT 7.6  ALBUMIN 3.6   No results for input(s): LIPASE, AMYLASE in the last 168 hours. No results for input(s): AMMONIA in the last 168 hours. Coagulation Profile: Recent Labs  Lab 07/11/20 1606  INR 1.1   Cardiac Enzymes: No results for input(s): CKTOTAL, CKMB, CKMBINDEX, TROPONINI in the last 168 hours. BNP (last 3 results) No results for input(s): PROBNP in the last 8760 hours. HbA1C: No results for input(s): HGBA1C in the last 72 hours. CBG: Recent Labs  Lab 07/13/20 1100 07/13/20 1513  GLUCAP 190* 183*   Lipid Profile: No results for input(s): CHOL, HDL, LDLCALC, TRIG, CHOLHDL, LDLDIRECT in the last 72 hours. Thyroid Function Tests: No results for input(s): TSH, T4TOTAL, FREET4, T3FREE, THYROIDAB in the last 72 hours. Anemia Panel: No results for input(s): VITAMINB12, FOLATE, FERRITIN,  TIBC, IRON, RETICCTPCT in the last 72 hours. Sepsis Labs: No results for input(s): PROCALCITON, LATICACIDVEN in the last 168 hours.  Recent Results (from the past 240 hour(s))  SARS CORONAVIRUS 2 (TAT 6-24 HRS) Nasopharyngeal Nasopharyngeal Swab     Status: None   Collection Time: 07/11/20  6:05 PM   Specimen: Nasopharyngeal Swab  Result Value Ref Range Status   SARS Coronavirus 2 NEGATIVE NEGATIVE Final    Comment: (NOTE) SARS-CoV-2 target nucleic acids are NOT DETECTED.  The SARS-CoV-2 RNA is generally detectable in upper and lower respiratory specimens during the acute phase of infection. Negative results do not preclude SARS-CoV-2 infection, do not rule out co-infections with other pathogens, and should not be used as the sole basis for treatment or other patient management decisions. Negative results must be combined with clinical observations, patient history, and epidemiological information. The expected result is Negative.  Fact Sheet for Patients: SugarRoll.be  Fact Sheet for Healthcare  Providers: https://www.woods-mathews.com/  This test is not yet approved or cleared by the [place] FDA and  has been authorized for detection and/or diagnosis of SARS-CoV-2 by FDA under an Emergency Use Authorization (EUA). This EUA will remain  in effect (meaning this test can be used) for the duration of the COVID-19 declaration under Se ction 564(b)(1) of the Act, 21 U.S.C. section 360bbb-3(b)(1), unless the authorization is terminated or revoked sooner.  Performed at [facility], [place] [address] [place], [place] [address]    Radiology Studies: No results found.  Scheduled Meds: . atorvastatin  80 mg Oral Daily  . carvedilol  3.125 mg Oral BID WC  . Chlorhexidine Gluconate Cloth  6 each Topical Daily  . clopidogrel  75 mg Oral Q breakfast  . ezetimibe  10 mg Oral Daily  . isosorbide mononitrate  30 mg Oral Daily  . rivaroxaban  20 mg Oral Daily  . sodium bicarbonate  650 mg Oral TID  . sodium chloride flush  3 mL Intravenous Q12H  . sodium chloride  1 g Oral BID WC  . spironolactone  25 mg Oral Daily   Continuous Infusions: . sodium chloride    . sodium chloride 0.9 %       LOS: 3 days    Time spent: 25 mins.    Shawna Clamp, MD [facility]   If 7PM-7AM, please contact night-coverage

## 2020-07-16 NOTE — Evaluation (Signed)
Physical Therapy Evaluation Patient Details Name: Bryan Scott MRN: [id number] DOB: December 18, 1945 Today's Date: 07/16/2020   History of Present Illness  Patient is a 75 year old male wiht PMH significant for CAD s/p CABG, hypertension, paroxysmal A. fib on anticoagulation, type 2 diabetes, hyperlipidemia and history of bladder cancer admitted with persistent chest pain and some blurred vision.  Patient is admitted for non-STEMI, He underwent left heart cath with PCI.    Clinical Impression  Patient is a 75 year old male whose evaluation is limited due to increasing patient agitation and poor safety awareness. Patient's history had to be obtained through previous documentation and OT session yesterday due to increasing agitation with questions. Per documentation patient was living in a motel and used a rollator. Patient agreed to get out of bed and into chair. Upon seated EOB was found to have bilateral knee contractions with L more affected than RLE. Bilateral LE weakness additionally is present. Patient refused RW and "furniture surfed" in room to chair. Patient demonstrated very poor safety awareness and required close CGA.  Upon reaching chair patient becomes agitated at PT for putting chair alarm on and refused tray or other comforts stating " stop being so  (blank blank) nice". Session terminated early due to patient demeanor.   Pt would benefit from skilled PT to address noted impairments and functional limitations (see below for any additional details).  Upon hospital discharge, pt would benefit from SNF placement due to high fall risk and poor safety awareness.     Follow Up Recommendations SNF    Equipment Recommendations  None recommended by PT (none if SNF,)    Recommendations for Other Services       Precautions / Restrictions Precautions Precautions: Fall Restrictions Weight Bearing Restrictions: No      Mobility  Bed Mobility Overal bed mobility: Needs Assistance Bed  Mobility: Supine to Sit     Supine to sit: Supervision;Min guard;HOB elevated     General bed mobility comments: Patient performs with supervision with heavy use of UE and railing    Transfers Overall transfer level: Needs assistance Equipment used: None;1 person hand held assist Transfers: Sit to/from Stand Sit to Stand: Min guard         General transfer comment: patient performs STS with heavy use of arms on surface, bilateral knee contractures limit standing position  Ambulation/Gait Ambulation/Gait assistance: Min guard Gait Distance (Feet): 30 Feet Assistive device: None   Gait velocity: decreased   General Gait Details: Patient refuses use of walker, furniture surfs around room with heavy reliance on UE's on bed rail, very unsafe with poor safety awareness.  Stairs            Wheelchair Mobility    Modified Rankin (Stroke Patients Only)       Balance Overall balance assessment: Needs assistance Sitting-balance support: Feet supported Sitting balance-Leahy Scale: Fair Sitting balance - Comments: patient is able to maintain sitting balance without LOB   Standing balance support: Single extremity supported Standing balance-Leahy Scale: Poor Standing balance comment: Patient requires a hand on a surface at all times for ambulation but refuses to use walker.                             Pertinent Vitals/Pain Pain Assessment: 0-10 Pain Score: 3  Pain Location: reports pain is constant all over body Pain Descriptors / Indicators: Aching Pain Intervention(s): Monitored during session;Repositioned    Home  Living Family/patient expects to be discharged to:: Private residence Living Arrangements: Alone   Type of Home: House Home Access: Stairs to enter Entrance Stairs-Rails: Can reach both Entrance Stairs-Number of Steps: 8 Home Layout: One level Home Equipment: Walker - 4 wheels Additional Comments: Patient becomes agitated at questions  of PLOF and living situation, information taken from OT report. Per OT Pt reports that he has been living in a motel (first floor, no steps to enter, tub/shower with no grab bars) for 27-month; did not want to elaborate on this and stated that he plans to return to his home on Feb 1st    Prior Function Level of Independence: Independent with assistive device(s)         Comments: Uses a rollator for functional mobility and endorses falling frequently. Pt reports completing ADLs/IADLs independently. 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 grossly 3-/5 strength RLE Sensation: decreased proprioception RLE Coordination: decreased gross motor LLE Deficits / Details: knee contracture noted. grossly 3-/5 strength LLE Sensation: decreased proprioception LLE Coordination: decreased gross motor       Communication   Communication: No difficulties  Cognition Arousal/Alertness: Awake/alert Behavior During Therapy: Agitated Overall Cognitive Status: Impaired/Different from baseline Area of Impairment: Safety/judgement                         Safety/Judgement: Decreased awareness of deficits     General Comments: Patient frequently lashes out verbally during session, states "stop being so (blank) nice". Frequent agitation with questioning resulting in need for history to be obtained through previous documentation.      General Comments General comments (skin integrity, edema, etc.): Patient has multiple bruises across body    Exercises Other Exercises Other Exercises: Patient educated on role of PT in acute care setting, safety awareness, and fall reduction strategies.    Assessment/Plan    PT Assessment Patient needs continued PT services  PT Problem List Decreased strength;Decreased range of motion;Decreased activity tolerance;Decreased balance;Decreased knowledge of use of DME;Decreased cognition;Decreased mobility;Decreased safety awareness;Cardiopulmonary status limiting activity       PT Treatment Interventions DME instruction;Gait training;Stair training;Functional mobility training;Therapeutic activities;Patient/family education;Neuromuscular re-education;Balance training;Therapeutic exercise    PT Goals (Current goals can be found in the Care Plan section)  Acute Rehab PT Goals Patient Stated Goal: to get out of here PT Goal Formulation: With patient Time For Goal Achievement: 07/30/20 Potential to Achieve Goals: Fair    Frequency Min 2X/week   Barriers to discharge Inaccessible home environment;Decreased caregiver support Patient is not safe to return home    Co-evaluation               AM-PAC PT "6 Clicks" Mobility  Outcome Measure Help needed turning from your back to your side while in a flat bed without using bedrails?: A Little Help needed moving from lying on your back to sitting on the side of a flat bed without using bedrails?: A Little Help needed moving to and from a bed to a chair (including a wheelchair)?: A Little Help needed standing up from a chair using your arms (e.g., wheelchair or bedside chair)?: A Little Help needed to walk in hospital room?: A Lot Help needed climbing 3-5  steps with a railing? : Total 6 Click Score: 15    End of Session Equipment Utilized During Treatment: Gait belt Activity Tolerance: Patient tolerated treatment well;Treatment limited secondary to agitation Patient left: in chair;with call bell/phone within reach;with chair alarm set Nurse Communication: Mobility status;Other (comment) (Patient upset that PT placed chair alarm on.) PT Visit Diagnosis: Unsteadiness on feet (R26.81);Other  abnormalities of gait and mobility (R26.89);Muscle weakness (generalized) (M62.81);History of falling (Z91.81);Difficulty in walking, not elsewhere classified (R26.2)    Time: [id number] PT Time Calculation (min) (ACUTE ONLY): 12 min   Charges:   PT Evaluation $PT Eval Moderate Complexity: 1 Mod        Janna Arch, PT, DPT   07/16/2020, 8:27 AM

## 2020-07-16 NOTE — Progress Notes (Signed)
Progress Note  Patient Name: Bryan Scott Date of Encounter: 07/16/2020  Primary Cardiologist: [facility]  Subjective   No chest pain or sob.  Was nauseated but improving after zofran this AM.  Inpatient Medications    Scheduled Meds: . atorvastatin  80 mg Oral Daily  . carvedilol  3.125 mg Oral BID WC  . Chlorhexidine Gluconate Cloth  6 each Topical Daily  . clopidogrel  75 mg Oral Q breakfast  . ezetimibe  10 mg Oral Daily  . isosorbide mononitrate  30 mg Oral Daily  . rivaroxaban  20 mg Oral Daily  . sodium bicarbonate  650 mg Oral TID  . sodium chloride flush  3 mL Intravenous Q12H  . sodium chloride  1 g Oral BID WC  . spironolactone  25 mg Oral Daily   Continuous Infusions: . sodium chloride    . sodium chloride 0.9 %     PRN Meds: sodium chloride, acetaminophen, magic mouthwash, midazolam, morphine injection, nitroGLYCERIN, ondansetron (ZOFRAN) IV, sodium chloride flush, zolpidem   Vital Signs    Vitals:   07/15/20 1651 07/15/20 2105 07/16/20 0313 07/16/20 0905  BP: (!) 96/56 122/79 121/75 (!) 112/100  Pulse: 75 73 64 71  Resp:  18 18 18   Temp:  98.2 F (36.8 C) (!) 97.5 F (36.4 C) 97.8 F (36.6 C)  TempSrc:   Oral Oral  SpO2: 96% 100% 95% 96%  Weight:   80.2 kg   Height:        Intake/Output Summary (Last 24 hours) at 07/16/2020 1244 Last data filed at 07/16/2020 0500 Gross per 24 hour  Intake 1111.67 ml  Output 1100 ml  Net 11.67 ml   Filed Weights   07/14/20 0425 07/15/20 0635 07/16/20 0313  Weight: 82.7 kg 80.7 kg 80.2 kg    Physical Exam   GEN: Well nourished, well developed, in no acute distress.  HEENT: Grossly normal.  Neck: Supple, no JVD, carotid bruits, or masses. Cardiac: RRR, no murmurs, rubs, or gallops. No clubbing, cyanosis, edema.  Radials 2+, DP/PT 2+ and equal bilaterally.  Respiratory:  Respirations regular and unlabored, clear to auscultation bilaterally. GI: Soft, nontender, nondistended, BS + x 4. MS: no deformity or  atrophy. Skin: warm and dry, no rash. Neuro:  Strength and sensation are intact. Psych: AAOx3.  Normal affect.  Labs    Chemistry Recent Labs  Lab 07/14/20 0317 07/15/20 0444 07/16/20 0440  NA 134* 129* 129*  K 3.8 4.2 3.9  CL 99 98 96*  CO2 19* 17* 20*  GLUCOSE 197* 357* 326*  BUN 28* 39* 43*  CREATININE 1.24 1.28* 1.29*  CALCIUM 9.8 9.7 9.8  PROT  --  7.6  --   ALBUMIN  --  3.6  --   AST  --  21  --   ALT  --  29  --   ALKPHOS  --  70  --   BILITOT  --  2.2*  --   GFRNONAA >60 59* 58*  ANIONGAP 16* 14 13     Hematology Recent Labs  Lab 07/14/20 0317 07/15/20 0444 07/16/20 0440  WBC 13.5* 12.9* 13.0*  RBC 5.58 5.23 5.24  HGB 18.2* 17.3* 17.0  HCT 52.1* 48.1 48.0  MCV 93.4 92.0 91.6  MCH 32.6 33.1 32.4  MCHC 34.9 36.0 35.4  RDW 12.0 11.8 11.9  PLT 183 195 189    Cardiac Enzymes  Recent Labs  Lab 07/11/20 1710 07/11/20 1843 07/11/20 2037 07/11/20 2238 07/12/20  0052  TROPONINIHS 100* 107* 103* 89* 89*     Lipids  Lab Results  Component Value Date   CHOL 232 (H) 09/10/2019   HDL 32 (L) 09/10/2019   LDLCALC 165 (H) 09/10/2019   TRIG 173 (H) 09/10/2019   CHOLHDL 7.3 09/10/2019    HbA1c  Lab Results  Component Value Date   HGBA1C 7.8 (H) 12/13/2019    Radiology    ---  Telemetry    RSR, PVCs - Personally Reviewed  Cardiac Studies   Cardiac Catheterization and Percutaneous Coronary Intervention 1.28.2022  Diagnostic Dominance: Right    Intervention **S/p PCI/DES to the VG  dRCA w/ 3.5 x 18 mm Resolute Onyx DES**     . Patient Profile     75 y.o. 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  He is now s/p DES to the VG  dRCA.  Assessment & Plan    1.  NSTEMI/CAD:  Prior h/o CAD s/p CABG x 3 in ~ 2007, presented w/ confusion, blurred vision, chest pain, and dyspnea, and was noted to have mild HsTrop elevation to peak of 107.  Cath on 1/28 w/ occlusions of the native LCX and RCA, as well as the VG  Diag.  The VG  dRCA noted to have a 95% distal stenosis, and this was successfully treated w/ a DES.  No chest pain or dyspnea.  Continues to work w/ PT w/ plan for SNF for rehab.  Cont  blocker, high potency statin, zetia, nitrate, and plavix.  No ASA in setting of concomitant rivaroxaban therapy.    2.  ICM/HFimpEF: EF 40-45% by echo 08/2019, but >65% by LV gram this admission. Euvolemic on exam.  Cont  blocker and spiro.  3.  Essential HTN:  Stable.  4.  HL:  Cont high potency statin rx and zetia.  LDL 165 in 08/2019.  ? Compliance.  May need PCSK9i or aliskiren if available through [facility].  5.  PAF:  Maintaining sinus on  blocker. [place] w/ xarelto. H/H stable.    6.  Deconditioning:  Working w/ PT.  Plan for SNF.  7.  Throat swelling:  Seen by ENT.  No interventions required.  8.  Dispo:  Awaiting SNF placement.  Doing well from cardiac standpoint w/o recurrent chest pain.  We will follow at a distance. Pls call/reach out to [place] w/ questions.  Signed, Murray Hodgkins, NP  07/16/2020, 12:44 PM    For questions or updates, please contact   Please consult [email or web address] for contact info under Cardiology/STEMI.

## 2020-07-16 NOTE — Progress Notes (Signed)
Mobility Specialist - Progress Note   07/16/20 1400  Mobility  Activity Refused mobility  Mobility performed by Mobility specialist    Pt declined mobility session this date for a number of reasons, including BM from laxatives and drowsiness from medications. Pt ultimately wanting to rest at this time. Mobility educated pt on the importance of staying active and encouraged participation in bed level therex, but pt continued to refuse services. Will attempt session another date/time as pt is agreeable.    Kathee Delton Mobility Specialist 07/16/20, 2:44 PM

## 2020-07-16 NOTE — Progress Notes (Signed)
Nutrition Follow-up  DOCUMENTATION CODES:   Not applicable  INTERVENTION:   Ensure Max protein supplement BID, each supplement provides 150kcal and 30g of protein.  MVI po daily   NUTRITION DIAGNOSIS:   Inadequate oral intake related to dysphagia as evidenced by per patient/family report. -new diagnoses   GOAL:   Patient will meet greater than or equal to 90% of their needs -progressing   MONITOR:   PO intake,Supplement acceptance,Labs,Weight trends,Skin,I & O's  ASSESSMENT:   75 y.o. male with medical history significant for CAD s/p CABG, hypertension, paroxysmal atrial fibrillation on anticoagulation, type 2 diabetes, hyperlipidemia, history of bladder cancer and pancreatic mass who presents with concerns of persistent chest pain was found to have NSTEMI now s/p cardiac cath 1/28.  RD working remotely.  Unable to reach pt by phone. Pt developed sore throat and difficulty swallowing during his admit and was seen by ENT who did not find any significant issues. Per chart review, it appears that pt's oral intake and ability to swallow is improving. Pt is documented to be eating 25-100% of meals in hospital. RD will add supplements and MVI to help pt meet his estimated needs. Per chart, pt is weight stable since admit. Pt currently awaiting SNF placement.   Medications reviewed and include: plavix, insulin, Na bicarbonate, aldactone   Labs reviewed: Na 129(L), BUN 43(H), creat 1.29(H), P 2.7 wnl, Mg 2.0 wnl Wbc- 13.0(H) cbgs- 190, 183 x 24 hrs AIC 7.8(H)- 6/29  NUTRITION - FOCUSED PHYSICAL EXAM: Unable to perform at this time   Diet Order:   Diet Order            DIET SOFT Room service appropriate? Yes; Fluid consistency: Thin  Diet effective now                EDUCATION NEEDS:   Not appropriate for education at this time  Skin:  Skin Assessment: Reviewed RN Assessment  Last BM:  1/26  Height:   Ht Readings from Last 1 Encounters:  07/11/20 6' (1.829 m)     Weight:   Wt Readings from Last 1 Encounters:  07/16/20 80.2 kg    BMI:  Body mass index is 23.98 kg/m.  Estimated Nutritional Needs:   Kcal:  2200-2400  Protein:  110-125  Fluid:  >/= 2L  Koleen Distance MS, RD, LDN Please refer to [facility] for RD and/or RD on-call/weekend/after hours pager

## 2020-07-16 NOTE — Care Management Important Message (Signed)
Important Message  Patient Details  Name: TEIGAN SAHLI MRN: [id number] Date of Birth: 09/17/1945   Medicare Important Message Given:  Yes     Dannette Barbara 07/16/2020, 1:32 PM

## 2020-07-16 NOTE — NC FL2 (Signed)
[place] LEVEL OF CARE SCREENING TOOL     IDENTIFICATION  Patient Name: Bryan Scott Birthdate: Sep 05, 1945 Sex: male Admission Date (Current Location): 07/11/2020  [place] and [place] Number:  Engineering geologist and Address:  [facility], [address], [place], [place] [address]      Provider Number: [id number]  Attending Physician Name and Address:  Shawna Clamp, MD  Relative Name and Phone Number:  Clydene Pugh- sister    Current Level of Care: SNF Recommended Level of Care: [place] Prior Approval Number:    Date Approved/Denied:   PASRR Number: [id number]  Discharge Plan: SNF    Current Diagnoses: Patient Active Problem List   Diagnosis Date Noted  . NSTEMI (non-ST elevated myocardial infarction) ([place]) 07/11/2020  . AKI (acute kidney injury) ([place]) 07/11/2020  . Acute metabolic encephalopathy 78/46/9629  . GERD (gastroesophageal reflux disease) 09/09/2019  . CAD (coronary artery disease) 09/09/2019  . Chronic pain syndrome 09/09/2019  . Leukocytosis 09/09/2019  . Shortness of breath   . Hyponatremia 09/26/2016  . Chest pain 09/22/2016  . 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  . Abnormal EKG 09/21/2016  . Chronic pain 09/21/2016  . PAF (paroxysmal atrial fibrillation) ([place]) 09/21/2016  . Essential hypertension 05/02/2016  . Carotid stenosis 05/02/2016  . CAD in native artery   . Atypical chest pain   . Type 2 diabetes mellitus without complication ([place])   . Hyperlipidemia   . Pain of left upper extremity   . Angina pectoris ([place])   . Abdominal pain   . Chest pain, central 03/13/2015  . Hypertensive urgency 03/13/2015  . Spinal stenosis 01/10/2012    Orientation RESPIRATION BLADDER Height & Weight     Self,Time,Place  O2 Continent Weight: 80.2 kg Height:  6' (182.9 cm)  BEHAVIORAL SYMPTOMS/MOOD NEUROLOGICAL BOWEL NUTRITION STATUS      Continent Diet  AMBULATORY STATUS  COMMUNICATION OF NEEDS Skin   Extensive Assist Verbally Bruising                       Personal Care Assistance Level of Assistance  Bathing,Feeding,Dressing Bathing Assistance: Independent Feeding assistance: Independent Dressing Assistance: Limited assistance     Functional Limitations Info  Sight,Hearing,Speech Sight Info: Impaired (Blurred Vision) Hearing Info: Adequate Speech Info: Adequate    SPECIAL CARE FACTORS FREQUENCY  PT (By licensed PT),OT (By licensed OT)     PT Frequency: 5x week OT Frequency: 5x week            Contractures Contractures Info: Not present    Additional Factors Info  Code Status,Allergies Code Status Info: Full Allergies Info: Aspirin, Heparin, Lisinopril           Current Medications (07/16/2020):  This is the current hospital active medication list Current Facility-Administered Medications  Medication Dose Route Frequency Provider Last Rate Last Admin  . 0.9 %  sodium chloride infusion  250 mL Intravenous PRN End, Harrell Gave, MD      . acetaminophen (TYLENOL) tablet 650 mg  650 mg Oral Q4H PRN End, Harrell Gave, MD   650 mg at 07/16/20 0908  . atorvastatin (LIPITOR) tablet 80 mg  80 mg Oral Daily End, Christopher, MD   80 mg at 07/16/20 0908  . carvedilol (COREG) tablet 3.125 mg  3.125 mg Oral BID WC Visser, Jacquelyn D, PA-C   3.125 mg at 07/16/20 0909  . Chlorhexidine Gluconate Cloth 2 % PADS 6 each  6 each Topical  Daily Max Sane, MD      . clopidogrel (PLAVIX) tablet 75 mg  75 mg Oral Q breakfast End, Christopher, MD   75 mg at 07/16/20 0908  . ezetimibe (ZETIA) tablet 10 mg  10 mg Oral Daily Marrianne Mood D, PA-C   10 mg at 07/16/20 0908  . isosorbide mononitrate (IMDUR) 24 hr tablet 30 mg  30 mg Oral Daily Marrianne Mood D, PA-C   30 mg at 07/16/20 0908  . loperamide (IMODIUM) capsule 2 mg  2 mg Oral PRN Shawna Clamp, MD   2 mg at 07/16/20 1314  . magic mouthwash  1 mL Oral QID PRN Shawna Clamp, MD      .  midazolam (VERSED) injection 1 mg  1 mg Intravenous Q1H PRN Minna Merritts, MD   1 mg at 07/13/20 1645  . morphine 2 MG/ML injection 2 mg  2 mg Intravenous Q3H PRN End, Christopher, MD   2 mg at 07/14/20 2002  . nitroGLYCERIN (NITROSTAT) SL tablet 0.4 mg  0.4 mg Sublingual Q5 min PRN End, Harrell Gave, MD   0.4 mg at 07/11/20 1821  . ondansetron (ZOFRAN) injection 4 mg  4 mg Intravenous Q6H PRN End, Harrell Gave, MD   4 mg at 07/16/20 1117  . rivaroxaban (XARELTO) tablet 20 mg  20 mg Oral Daily Marrianne Mood D, PA-C   20 mg at 07/16/20 0908  . sodium bicarbonate tablet 650 mg  650 mg Oral TID Shawna Clamp, MD   650 mg at 07/16/20 0907  . sodium chloride 0.9 % IV infusion   Intravenous Continuous End, Christopher, MD      . sodium chloride flush (NS) 0.9 % injection 3 mL  3 mL Intravenous Q12H End, Christopher, MD   3 mL at 07/16/20 0909  . sodium chloride flush (NS) 0.9 % injection 3 mL  3 mL Intravenous PRN End, Christopher, MD      . sodium chloride tablet 1 g  1 g Oral BID WC Shawna Clamp, MD   1 g at 07/16/20 0908  . spironolactone (ALDACTONE) tablet 25 mg  25 mg Oral Daily Marrianne Mood D, PA-C   25 mg at 07/16/20 1694  . zolpidem (AMBIEN) tablet 5 mg  5 mg Oral QHS PRN End, Harrell Gave, MD   5 mg at 07/15/20 2131     Discharge Medications: Please see discharge summary for a list of discharge medications.  Relevant Imaging Results:  Relevant Lab Results:   Additional Information    Kerin Salen, RN

## 2020-07-16 NOTE — Progress Notes (Signed)
Inpatient Diabetes Program Recommendations  AACE/ADA: New Consensus Statement on Inpatient Glycemic Control (2015)  Target Ranges:  Prepandial:   less than 140 mg/dL      Peak postprandial:   less than 180 mg/dL (1-2 hours)      Critically ill patients:  140 - 180 mg/dL   Lab Results  Component Value Date   GLUCAP 183 (H) 07/13/2020   HGBA1C 7.8 (H) 12/13/2019    Review of Glycemic Control Results for SERGI, GELLNER (MRN [id number]) as of 07/16/2020 14:27  Ref. Range 07/15/2020 04:44 07/16/2020 04:40  Glucose Latest Ref Range: 70 - 99 mg/dL 357 (H) 326 (H)   Diabetes history: DM2 Outpatient Diabetes medications: Jardiance 25 mg + Glucotrol 10 mg bid + Metformin 500 mg bid Current orders for Inpatient glycemic control: None  Inpatient Diabetes Program Recommendations:   Secure chat sent to Dr. Dwyane Dee regarding insulin management. Placed orders per verbal orders: -Lantus 10 units q hs -Novolog 0-15 units tid + hs 0-5 units correction  Thank you, Bethena Roys E. Erisha Paugh, RN, MSN, CDE  Diabetes Coordinator Inpatient Glycemic Control Team Team Pager [phone] (8am-5pm) 07/16/2020 2:42 PM

## 2020-07-17 DIAGNOSIS — I214 Non-ST elevation (NSTEMI) myocardial infarction: Secondary | ICD-10-CM | POA: Diagnosis not present

## 2020-07-17 LAB — BASIC METABOLIC PANEL
Anion gap: 10 (ref 5–15)
BUN: 32 mg/dL — ABNORMAL HIGH (ref 8–23)
CO2: 22 mmol/L (ref 22–32)
Calcium: 9.6 mg/dL (ref 8.9–10.3)
Chloride: 101 mmol/L (ref 98–111)
Creatinine, Ser: 1.12 mg/dL (ref 0.61–1.24)
GFR, Estimated: >60 mL/min (ref >60)
Glucose, Bld: 245 mg/dL — ABNORMAL HIGH (ref 70–99)
Potassium: 4 mmol/L (ref 3.5–5.1)
Sodium: 133 mmol/L — ABNORMAL LOW (ref 135–145)

## 2020-07-17 LAB — MAGNESIUM: Magnesium: 2 mg/dL (ref 1.7–2.4)

## 2020-07-17 LAB — GLUCOSE, CAPILLARY
Glucose-Capillary: 252 mg/dL — ABNORMAL HIGH (ref 70–99)
Glucose-Capillary: 316 mg/dL — ABNORMAL HIGH (ref 70–99)
Glucose-Capillary: 320 mg/dL — ABNORMAL HIGH (ref 70–99)
Glucose-Capillary: 186 mg/dL — ABNORMAL HIGH (ref 70–99)

## 2020-07-17 LAB — PHOSPHORUS: Phosphorus: 3.1 mg/dL (ref 2.5–4.6)

## 2020-07-17 MED ORDER — INSULIN ASPART 100 UNIT/ML ~~LOC~~ SOLN
3.0000 [IU] | Freq: Three times a day (TID) | SUBCUTANEOUS | Status: DC
  Administered 2020-07-17 – 2020-07-19 (×7): 3 [IU] via SUBCUTANEOUS
  Filled 2020-07-17 (×7): qty 1

## 2020-07-17 MED ORDER — INSULIN GLARGINE 100 UNIT/ML ~~LOC~~ SOLN
15.0000 [IU] | Freq: Every day | SUBCUTANEOUS | Status: DC
  Administered 2020-07-17 – 2020-07-18 (×2): 15 [IU] via SUBCUTANEOUS
  Filled 2020-07-17 (×3): qty 0.15

## 2020-07-17 NOTE — Progress Notes (Signed)
PROGRESS NOTE    Bryan Scott  NUU:[id number] DOB: 13-Jan-1946 DOA: 07/11/2020 PCP: [name], MD    Brief Narrative:  This 75 years old male with PMH significant for CAD s/p CABG, hypertension, paroxysmal A. fib on anticoagulation, type 2 diabetes, hyperlipidemia and history of bladder cancer admitted with persistent chest pain and some blurred vision.  Patient is admitted for non-STEMI, He underwent left heart cath with PCI.  Patient is started on Plavix and Xarelto post cath.  Chest pain has resolved but patient complains of having difficulty swallowing, sore throat and pain.  ENT consulted. No acute intervention needed at this time.  Follow-up outpatient ENT and GI for dysphagia.  Assessment & Plan:   Principal Problem:   NSTEMI (non-ST elevated myocardial infarction) ([place]) Active Problems:   Type 2 diabetes mellitus without complication (HCC)   Hyperlipidemia   Essential hypertension   PAF (paroxysmal atrial fibrillation) (HCC)   Hyponatremia   CAD (coronary artery disease)   Leukocytosis   AKI (acute kidney injury) ([place])   Chest pain sec. to NSTEMI / Coronary artery disease Patient with known history of CAD status post CABG He follows up with cardiology at the [facility]. EKG does not show any acute findings Initial troponin of 67 -->107->89.  Nuclear stress test back in June 2021 was low risk. Cardiology consulted, patient underwent left heart cath with multivessel CAD.  Successful PCI to SVG. Transitioned from IV heparin to Xarelto.  Patient started on Plavix, continue for at least 12 months.   No aspirin in the setting of allergies.  Continue chronic anticoagulation with Xarelto. Patient cleared from cardiology to be discharged.  Blurry vision/tinnitus No focal Neurological findings - Low suspicion of CVA. Patient had CT head back in March 2021 for dizziness and had paranasal sinus disease which could explain his current symptoms.  MRI brain negative for CVA. monitor  symptoms - outpt f/up with ENT/Ophthal  Hyponatremia:  Suspect due to hypovolemia. Na 132 ->134->129> 133 Continue sodium tablet 1 gm TID  AKI -present on admission and now resolved. Likely prerenal given hyponatremia and hemoconcentrated hemoglobin and hematocrit Continue IV 75 cc NS infusion.  Elevated anion gap -POA and resolved.  Suspect from hypovolemia. follow with repeat BMP after fluids.   Leukocytosis Unclear etiology. No signs of infection Follow repeat CBC in the morning after fluids.  Essential hypertension -BP minimally elevated.  Diabetes mellitus, type II -Last HbA1C of 8 in 04/2020. Continue regular Insulin sliding scale.  History of paroxysmal atrial fibrillation -Hold Xareto while on heparin gtt. Last dose morning of 1/26.   Xarelto resumed post cath as per cardiology  Hyperlipidemia -Continue statin  Pancreatic mass F/up at [facility] - Work up in progress   DVT prophylaxis: Xarelto Code Status: Full code Family Communication: No family at bedside Disposition Plan:   Status is: Inpatient  Remains inpatient appropriate because:Inpatient level of care appropriate due to severity of illness   Dispo: The patient is from: Home              Anticipated d/c is to:  SNF              Anticipated d/c date is:1 day.              Patient currently is not medically stable to d/c.   Difficult to place patient No   Consultants:   Cardiology  Procedures: Left heart catheterization. Antimicrobials:   Anti-infectives (From admission, onward)   None  Subjective: Patient was seen and examined at bedside.  Overnight events noted.  Patient reports feeling better,   Sore throat has improved.  He reports feeling generalized weakness , feels tired .  Objective: Vitals:   07/16/20 2119 07/17/20 0321 07/17/20 0833 07/17/20 1242  BP: 127/79 (!) 145/70 (!) 154/85 93/79  Pulse: 66 67 70 71  Resp: 18 16 20 20   Temp: 97.6 F (36.4 C) 98.2 F (36.8  C) 98.2 F (36.8 C) (!) 97.5 F (36.4 C)  TempSrc: Oral Oral Oral Oral  SpO2: 97% 98% 96% 99%  Weight:      Height:        Intake/Output Summary (Last 24 hours) at 07/17/2020 1411 Last data filed at 07/17/2020 1330 Gross per 24 hour  Intake 720 ml  Output 150 ml  Net 570 ml   Filed Weights   07/14/20 0425 07/15/20 0635 07/16/20 0313  Weight: 82.7 kg 80.7 kg 80.2 kg    Examination:  General exam: Appears calm and comfortable, not in any distress. Respiratory system: Clear to auscultation. Respiratory effort normal. Cardiovascular system: S1 & S2 heard, RRR. No JVD, murmurs, rubs, gallops or clicks. No pedal edema. Gastrointestinal system: Abdomen is nondistended, soft and nontender. No organomegaly or masses felt. Normal bowel sounds heard. Central nervous system: Alert and oriented. No focal neurological deficits. Extremities: Symmetric 5 x 5 power. Skin: No rashes, lesions or ulcers Psychiatry: Judgement and insight appear normal. Mood & affect appropriate.     Data Reviewed: I have personally reviewed following labs and imaging studies  CBC: Recent Labs  Lab 07/12/20 0308 07/13/20 0249 07/14/20 0317 07/15/20 0444 07/16/20 0440  WBC 14.9* 10.8* 13.5* 12.9* 13.0*  HGB 16.6 18.3* 18.2* 17.3* 17.0  HCT 45.6 50.8 52.1* 48.1 48.0  MCV 91.2 90.7 93.4 92.0 91.6  PLT 172 166 183 195 [id number]   Basic Metabolic Panel: Recent Labs  Lab 07/13/20 0249 07/14/20 0317 07/15/20 0444 07/16/20 0440 07/17/20 0648  NA 134* 134* 129* 129* 133*  K 3.5 3.8 4.2 3.9 4.0  CL 97* 99 98 96* 101  CO2 25 19* 17* 20* 22  GLUCOSE 219* 197* 357* 326* 245*  BUN 34* 28* 39* 43* 32*  CREATININE 1.22 1.24 1.28* 1.29* 1.12  CALCIUM 9.4 9.8 9.7 9.8 9.6  MG  --   --  2.2 2.0 2.0  PHOS  --   --  4.1 2.7 3.1   GFR: Estimated Creatinine Clearance: 63.5 mL/min (by C-G formula based on SCr of 1.12 mg/dL). Liver Function Tests: Recent Labs  Lab 07/15/20 0444  AST 21  ALT 29  ALKPHOS 70   BILITOT 2.2*  PROT 7.6  ALBUMIN 3.6   No results for input(s): LIPASE, AMYLASE in the last 168 hours. No results for input(s): AMMONIA in the last 168 hours. Coagulation Profile: Recent Labs  Lab 07/11/20 1606  INR 1.1   Cardiac Enzymes: No results for input(s): CKTOTAL, CKMB, CKMBINDEX, TROPONINI in the last 168 hours. BNP (last 3 results) No results for input(s): PROBNP in the last 8760 hours. HbA1C: Recent Labs    07/16/20 1456  HGBA1C 9.5*   CBG: Recent Labs  Lab 07/13/20 1513 07/16/20 1639 07/16/20 2122 07/17/20 0929 07/17/20 1237  GLUCAP 183* 355* 313* 252* 316*   Lipid Profile: No results for input(s): CHOL, HDL, LDLCALC, TRIG, CHOLHDL, LDLDIRECT in the last 72 hours. Thyroid Function Tests: No results for input(s): TSH, T4TOTAL, FREET4, T3FREE, THYROIDAB in the last 72 hours. Anemia Panel: No results  for input(s): VITAMINB12, FOLATE, FERRITIN, TIBC, IRON, RETICCTPCT in the last 72 hours. Sepsis Labs: No results for input(s): PROCALCITON, LATICACIDVEN in the last 168 hours.  Recent Results (from the past 240 hour(s))  SARS CORONAVIRUS 2 (TAT 6-24 HRS) Nasopharyngeal Nasopharyngeal Swab     Status: None   Collection Time: 07/11/20  6:05 PM   Specimen: Nasopharyngeal Swab  Result Value Ref Range Status   SARS Coronavirus 2 NEGATIVE NEGATIVE Final    Comment: (NOTE) SARS-CoV-2 target nucleic acids are NOT DETECTED.  The SARS-CoV-2 RNA is generally detectable in upper and lower respiratory specimens during the acute phase of infection. Negative results do not preclude SARS-CoV-2 infection, do not rule out co-infections with other pathogens, and should not be used as the sole basis for treatment or other patient management decisions. Negative results must be combined with clinical observations, patient history, and epidemiological information. The expected result is Negative.  Fact Sheet for Patients: SugarRoll.be  Fact  Sheet for Healthcare Providers: https://www.woods-mathews.com/  This test is not yet approved or cleared by the [place] FDA and  has been authorized for detection and/or diagnosis of SARS-CoV-2 by FDA under an Emergency Use Authorization (EUA). This EUA will remain  in effect (meaning this test can be used) for the duration of the COVID-19 declaration under Se ction 564(b)(1) of the Act, 21 U.S.C. section 360bbb-3(b)(1), unless the authorization is terminated or revoked sooner.  Performed at [facility], [place] [address] [place], [place] [address]    Radiology Studies: No results found.  Scheduled Meds: . atorvastatin  80 mg Oral Daily  . carvedilol  3.125 mg Oral BID WC  . Chlorhexidine Gluconate Cloth  6 each Topical Daily  . clopidogrel  75 mg Oral Q breakfast  . ezetimibe  10 mg Oral Daily  . insulin aspart  0-15 Units Subcutaneous TID WC  . insulin aspart  0-5 Units Subcutaneous QHS  . insulin aspart  3 Units Subcutaneous TID WC  . insulin glargine  15 Units Subcutaneous QHS  . isosorbide mononitrate  30 mg Oral Daily  . multivitamin with minerals  1 tablet Oral Daily  . Ensure Max Protein  11 oz Oral BID  . rivaroxaban  20 mg Oral Daily  . sodium chloride flush  3 mL Intravenous Q12H  . sodium chloride  1 g Oral BID WC  . spironolactone  25 mg Oral Daily   Continuous Infusions: . sodium chloride    . sodium chloride 0.9 %       LOS: 4 days    Time spent: 25 mins.    Shawna Clamp, MD [facility]   If 7PM-7AM, please contact night-coverage

## 2020-07-17 NOTE — Progress Notes (Signed)
Inpatient Diabetes Program Recommendations  AACE/ADA: New Consensus Statement on Inpatient Glycemic Control (2015)  Target Ranges:  Prepandial:   less than 140 mg/dL      Peak postprandial:   less than 180 mg/dL (1-2 hours)      Critically ill patients:  140 - 180 mg/dL   Lab Results  Component Value Date   GLUCAP 313 (H) 07/16/2020   HGBA1C 9.5 (H) 07/16/2020    Review of Glycemic Control Results for Bryan Scott, Bryan Scott (MRN [id number]) as of 07/17/2020 09:26  Ref. Range 07/13/2020 11:00 07/13/2020 15:13 07/16/2020 16:39 07/16/2020 21:22  Glucose-Capillary Latest Ref Range: 70 - 99 mg/dL 190 (H) 183 (H) 355 (H) 313 (H)   Diabetes history: DM2 Outpatient Diabetes medications: Jardiance 25 mg + Glucotrol 10 mg bid + Metformin 500 mg bid Current orders for Inpatient glycemic control: Lantus 10 units qd + Novolog 0-15 units tid + 0-5 units hs correction  Inpatient Diabetes Program Recommendations:   -Increase Lantus to 15 units -Add Novolog 3 units tid meal coverage if eats 50% Secure chat sent to Dr. Dwyane Dee.  Thank you, Bryan Scott. Bryan Osgood, RN, MSN, CDE  Diabetes Coordinator Inpatient Glycemic Control Team Team Pager [phone] (8am-5pm) 07/17/2020 9:33 AM

## 2020-07-17 NOTE — Progress Notes (Signed)
Occupational Therapy Treatment Patient Details Name: ISSAIC WELLIVER MRN: [id number] DOB: 06/10/1946 Today's Date: 07/17/2020    History of present illness Patient is a 75 year old male wiht PMH significant for CAD s/p CABG, hypertension, paroxysmal A. fib on anticoagulation, type 2 diabetes, hyperlipidemia and history of bladder cancer admitted with persistent chest pain and some blurred vision. Patient is admitted for non-STEMI, He underwent left heart cath with PCI.   OT comments  Pt seen for OT treatment on this date. Upon arrival to room, pt with CNA in bathroom, verbalizing desire bathe. Upon return to recliner, pt observed to "furniture surf" and was prompted by OT to use RW in setting of decreased balance and use of Rolator at baseline; pt agreeable and able to walk ~10 feet with RW and CGA. Pt was assisted in seated UB/LB bathing and dressing, requiring MAX verbal cues for activity pacing; max RR during bathing/dressing 35-39 with OT prompting pt to initiate x3 seated rest breaks. Of note, pt rated seated bathing & dressing 8/10 on 0-10 exertion scale. Pt educated on the importance of implementing energy conservation strategies following tx for NSTEMI, with pt verbalizing understanding. Pt continues to benefit from skilled OT services to improve safety awareness, balance, and strength, and minimize risk of future falls, injury, and readmission. Will continue to follow POC. Discharge recommendation remains appropriate.    Follow Up Recommendations  SNF    Equipment Recommendations  Other (comment) (defer)       Precautions / Restrictions Precautions Precautions: Fall Restrictions Weight Bearing Restrictions: No       Mobility            Transfers Overall transfer level: Needs assistance Equipment used: Rolling walker (2 wheeled) Transfers: Sit to/from Stand Sit to Stand: Min guard              Balance Overall balance assessment: Needs assistance Sitting-balance  support: No upper extremity supported;Feet unsupported Sitting balance-Leahy Scale: Fair Sitting balance - Comments: Pt able to bend over, with UE beyond BOS to perform LB bathing/dressing.   Standing balance support: Bilateral upper extremity supported;During functional activity   Standing balance comment: One lateral LOB while standing with RW during posterior peri-care; able to easily regain balance following CGA. Able to walk ~10 feet in room with RW and CGA                           ADL either performed or assessed with clinical judgement   ADL Overall ADL's : Needs assistance/impaired     Grooming: Wash/dry face;Applying deodorant;Supervision/safety;Set up;Sitting   Upper Body Bathing: Supervision/ safety;Set up;Sitting   Lower Body Bathing: Sit to/from stand;Min guard Lower Body Bathing Details (indicate cue type and reason): One lateral LOB while standing with RW during posterior peri-care; able to easily regain balance following CGA Upper Body Dressing : Supervision/safety;Set up;Sitting Upper Body Dressing Details (indicate cue type and reason): To don/doff hospital down Lower Body Dressing: Moderate assistance Lower Body Dressing Details (indicate cue type and reason): To don/doff socks               General ADL Comments: Pt required MAX verbal cues for activity pacing in setting of decreased safety awareness and impulsivity. Max RR during bathing/dressing 35-39, prompting cues to initiate x3 seated rest breaks. HR 70s and SpO2 >95% throughout bathing/dressing. Pt rated task as 8/10 on 0-10 exertion scale.  Pt educated on the importance of gradually increasing workload  following tx for NSTEMI, with pt verbalizing understanding.               Cognition Arousal/Alertness: Awake/alert Behavior During Therapy: Restless Overall Cognitive Status: Impaired/Different from baseline Area of Impairment: Safety/judgement                          Safety/Judgement: Decreased awareness of deficits Awareness: Intellectual Problem Solving: Requires verbal cues General Comments: Pt pleasant and agreeable throughout session, however required MAX verbal cues for safety awareness and activity pacing. At end of session, pt acknowledged importance of implementing energy conservation strategies and using a RW for functional mobility                   Pertinent Vitals/ Pain       Pain Assessment: 0-10 Pain Score: 6  Pain Location: Back/neck Pain Descriptors / Indicators: Discomfort Pain Intervention(s): Limited activity within patient's tolerance;Monitored during session         Frequency  Min 1X/week        Progress Toward Goals  OT Goals(current goals can now be found in the care plan section)  Progress towards OT goals: Progressing toward goals  Acute Rehab OT Goals Patient Stated Goal: to get home OT Goal Formulation: With patient Time For Goal Achievement: 07/29/20 Potential to Achieve Goals: [place] Discharge plan remains appropriate;Frequency remains appropriate       AM-PAC OT "6 Clicks" Daily Activity     Outcome Measure   Help from another person eating meals?: None Help from another person taking care of personal grooming?: A Little Help from another person toileting, which includes using toliet, bedpan, or urinal?: A Little Help from another person bathing (including washing, rinsing, drying)?: A Little Help from another person to put on and taking off regular upper body clothing?: None Help from another person to put on and taking off regular lower body clothing?: A Lot 6 Click Score: 19    End of Session Equipment Utilized During Treatment: Gait belt;Rolling walker  OT Visit Diagnosis: Unsteadiness on feet (R26.81);History of falling (Z91.81);Muscle weakness (generalized) (M62.81)   Activity Tolerance Patient tolerated treatment well   Patient Left in chair;with call bell/phone within reach    Nurse Communication Mobility status        Time: 6967-8938 OT Time Calculation (min): 34 min  Charges: OT General Charges $OT Visit: 1 Visit OT Treatments $Self Care/Home Management : 23-37 mins   Fredirick Maudlin, [place]

## 2020-07-18 DIAGNOSIS — I480 Paroxysmal atrial fibrillation: Secondary | ICD-10-CM | POA: Diagnosis not present

## 2020-07-18 DIAGNOSIS — I214 Non-ST elevation (NSTEMI) myocardial infarction: Secondary | ICD-10-CM | POA: Diagnosis not present

## 2020-07-18 DIAGNOSIS — I2510 Atherosclerotic heart disease of native coronary artery without angina pectoris: Secondary | ICD-10-CM | POA: Diagnosis not present

## 2020-07-18 LAB — GLUCOSE, CAPILLARY
Glucose-Capillary: 238 mg/dL — ABNORMAL HIGH (ref 70–99)
Glucose-Capillary: 246 mg/dL — ABNORMAL HIGH (ref 70–99)
Glucose-Capillary: 267 mg/dL — ABNORMAL HIGH (ref 70–99)
Glucose-Capillary: 190 mg/dL — ABNORMAL HIGH (ref 70–99)

## 2020-07-18 LAB — BASIC METABOLIC PANEL
Anion gap: 12 (ref 5–15)
BUN: 27 mg/dL — ABNORMAL HIGH (ref 8–23)
CO2: 21 mmol/L — ABNORMAL LOW (ref 22–32)
Calcium: 9.3 mg/dL (ref 8.9–10.3)
Chloride: 100 mmol/L (ref 98–111)
Creatinine, Ser: 0.97 mg/dL (ref 0.61–1.24)
GFR, Estimated: >60 mL/min (ref >60)
Glucose, Bld: 205 mg/dL — ABNORMAL HIGH (ref 70–99)
Potassium: 3.9 mmol/L (ref 3.5–5.1)
Sodium: 133 mmol/L — ABNORMAL LOW (ref 135–145)

## 2020-07-18 LAB — HEMOGLOBIN AND HEMATOCRIT, BLOOD
HCT: 48.1 % (ref 39.0–52.0)
Hemoglobin: 17 g/dL (ref 13.0–17.0)

## 2020-07-18 LAB — MAGNESIUM: Magnesium: 1.8 mg/dL (ref 1.7–2.4)

## 2020-07-18 LAB — PHOSPHORUS: Phosphorus: 2.7 mg/dL (ref 2.5–4.6)

## 2020-07-18 MED ORDER — GABAPENTIN 250 MG/5ML PO SOLN
100.0000 mg | Freq: Three times a day (TID) | ORAL | Status: DC
  Filled 2020-07-18: qty 2

## 2020-07-18 MED ORDER — GABAPENTIN 100 MG PO CAPS
100.0000 mg | ORAL_CAPSULE | Freq: Three times a day (TID) | ORAL | Status: DC
  Administered 2020-07-18 – 2020-07-21 (×11): 100 mg via ORAL
  Filled 2020-07-18 (×10): qty 1

## 2020-07-18 NOTE — Progress Notes (Addendum)
PROGRESS NOTE    Bryan Scott  NID:[id number] DOB: Jan 28, 1946 DOA: 07/11/2020 PCP: [name], MD    Brief Narrative:  This 75 years old male with PMH significant for CAD s/p CABG, hypertension, paroxysmal A. fib on anticoagulation, type 2 diabetes, hyperlipidemia and history of bladder cancer admitted with persistent chest pain and some blurred vision.  Patient is admitted for non-STEMI, He underwent left heart cath with PCI.  Patient is started on Plavix and Xarelto post cath.  Chest pain has resolved but patient complains of having difficulty swallowing, sore throat and pain.  ENT consulted. No acute intervention needed at this time.  Follow-up outpatient ENT and GI for dysphagia.  Assessment & Plan:   Principal Problem:   NSTEMI (non-ST elevated myocardial infarction) ([place]) Active Problems:   Type 2 diabetes mellitus without complication (HCC)   Hyperlipidemia   Essential hypertension   PAF (paroxysmal atrial fibrillation) (HCC)   Hyponatremia   CAD (coronary artery disease)   Leukocytosis   AKI (acute kidney injury) ([place])   Chest pain sec. to NSTEMI / Coronary artery disease Patient with known history of CAD status post CABG He follows up with cardiology at the [facility]. EKG does not show any acute findings Initial troponin of 67 -->107->89.  Nuclear stress test back in June 2021 was low risk. Cardiology consulted, patient underwent left heart cath with multivessel CAD.  Successful PCI to SVG. Transitioned from IV heparin to Xarelto.  Patient started on Plavix, continue for at least 12 months.   No aspirin in the setting of allergies.  Continue chronic anticoagulation with Xarelto. Patient cleared from cardiology to be discharged.  Blurry vision/tinnitus No focal Neurological findings - Low suspicion of CVA. Patient had CT head back in March 2021 for dizziness and had paranasal sinus disease which could explain his current symptoms.  MRI brain negative for CVA. monitor  symptoms - outpt f/up with ENT/Ophthal  Hyponatremia:  Suspect due to hypovolemia. Na 132 ->134->129> 133 Continue sodium tablet 1 gm TID  AKI -present on admission and now resolved. Likely prerenal given hyponatremia and hemoconcentrated hemoglobin and hematocrit Discontinue IV 75 cc NS infusion.  Elevated anion gap -POA and resolved.  Suspect from hypovolemia. follow with repeat BMP after fluids.   Leukocytosis Unclear etiology. No signs of infection Follow repeat CBC in the morning after fluids.  Essential hypertension -BP minimally elevated.  Diabetes mellitus, type II -Last HbA1C of 8 in 04/2020. Continue regular Insulin sliding scale.  History of paroxysmal atrial fibrillation -Hold Xareto while on heparin gtt. Last dose morning of 1/26.   Xarelto resumed post cath as per cardiology  Hyperlipidemia -Continue statin  Pancreatic mass F/up at [facility] - Work up in progress   DVT prophylaxis: Xarelto Code Status: Full code Family Communication: No family at bedside Disposition Plan:   Status is: Inpatient  Remains inpatient appropriate because:Inpatient level of care appropriate due to severity of illness   Dispo: The patient is from: Home              Anticipated d/c is to:  SNF              Anticipated d/c date is:1 day.              Patient currently is medically stable for discharge, awaiting SNF placement.   Difficult to place patient No   Consultants:   Cardiology  Procedures: Left heart catheterization. Antimicrobials:   Anti-infectives (From admission, onward)   None  Subjective: Patient was seen and examined at bedside.  Overnight events noted.  Patient reports feeling better,    He reports feeling much improved, denies any chest pain, sob, dizziness.   Objective: Vitals:   07/17/20 1943 07/18/20 0346 07/18/20 0744 07/18/20 1146  BP: 126/84 (!) 143/73 116/76 119/75  Pulse: 74 70 65 73  Resp: 18 20 17 18   Temp: (!) 97.5 F  (36.4 C) 98.6 F (37 C) 98.3 F (36.8 C) 98.3 F (36.8 C)  TempSrc:  Oral    SpO2: 99% 98% 97% 98%  Weight:  81 kg    Height:        Intake/Output Summary (Last 24 hours) at 07/18/2020 1507 Last data filed at 07/18/2020 0954 Gross per 24 hour  Intake 960 ml  Output --  Net 960 ml   Filed Weights   07/15/20 0635 07/16/20 0313 07/18/20 0346  Weight: 80.7 kg 80.2 kg 81 kg    Examination:  General exam: Appears calm and comfortable, not in any distress. Respiratory system: Clear to auscultation. Respiratory effort normal. Cardiovascular system: S1 & S2 heard, RRR. No JVD, murmurs, rubs, gallops or clicks. No pedal edema. Gastrointestinal system: Abdomen is nondistended, soft and nontender. No organomegaly or masses felt. Normal bowel sounds heard. Central nervous system: Alert and oriented. No focal neurological deficits. Extremities: Symmetric 5 x 5 power. Skin: No rashes, lesions or ulcers Psychiatry: Judgement and insight appear normal. Mood & affect appropriate.     Data Reviewed: I have personally reviewed following labs and imaging studies  CBC: Recent Labs  Lab 07/12/20 0308 07/13/20 0249 07/14/20 0317 07/15/20 0444 07/16/20 0440 07/18/20 0540  WBC 14.9* 10.8* 13.5* 12.9* 13.0*  --   HGB 16.6 18.3* 18.2* 17.3* 17.0 17.0  HCT 45.6 50.8 52.1* 48.1 48.0 48.1  MCV 91.2 90.7 93.4 92.0 91.6  --   PLT 172 166 183 195 189  --    Basic Metabolic Panel: Recent Labs  Lab 07/14/20 0317 07/15/20 0444 07/16/20 0440 07/17/20 0648 07/18/20 0540  NA 134* 129* 129* 133* 133*  K 3.8 4.2 3.9 4.0 3.9  CL 99 98 96* 101 100  CO2 19* 17* 20* 22 21*  GLUCOSE 197* 357* 326* 245* 205*  BUN 28* 39* 43* 32* 27*  CREATININE 1.24 1.28* 1.29* 1.12 0.97  CALCIUM 9.8 9.7 9.8 9.6 9.3  MG  --  2.2 2.0 2.0 1.8  PHOS  --  4.1 2.7 3.1 2.7   GFR: Estimated Creatinine Clearance: 73.3 mL/min (by C-G formula based on SCr of 0.97 mg/dL). Liver Function Tests: Recent Labs  Lab  07/15/20 0444  AST 21  ALT 29  ALKPHOS 70  BILITOT 2.2*  PROT 7.6  ALBUMIN 3.6   No results for input(s): LIPASE, AMYLASE in the last 168 hours. No results for input(s): AMMONIA in the last 168 hours. Coagulation Profile: Recent Labs  Lab 07/11/20 1606  INR 1.1   Cardiac Enzymes: No results for input(s): CKTOTAL, CKMB, CKMBINDEX, TROPONINI in the last 168 hours. BNP (last 3 results) No results for input(s): PROBNP in the last 8760 hours. HbA1C: Recent Labs    07/16/20 1456  HGBA1C 9.5*   CBG: Recent Labs  Lab 07/17/20 1237 07/17/20 1612 07/17/20 2118 07/18/20 0744 07/18/20 1145  GLUCAP 316* 320* 186* 238* 246*   Lipid Profile: No results for input(s): CHOL, HDL, LDLCALC, TRIG, CHOLHDL, LDLDIRECT in the last 72 hours. Thyroid Function Tests: No results for input(s): TSH, T4TOTAL, FREET4, T3FREE, THYROIDAB in the  last 72 hours. Anemia Panel: No results for input(s): VITAMINB12, FOLATE, FERRITIN, TIBC, IRON, RETICCTPCT in the last 72 hours. Sepsis Labs: No results for input(s): PROCALCITON, LATICACIDVEN in the last 168 hours.  Recent Results (from the past 240 hour(s))  SARS CORONAVIRUS 2 (TAT 6-24 HRS) Nasopharyngeal Nasopharyngeal Swab     Status: None   Collection Time: 07/11/20  6:05 PM   Specimen: Nasopharyngeal Swab  Result Value Ref Range Status   SARS Coronavirus 2 NEGATIVE NEGATIVE Final    Comment: (NOTE) SARS-CoV-2 target nucleic acids are NOT DETECTED.  The SARS-CoV-2 RNA is generally detectable in upper and lower respiratory specimens during the acute phase of infection. Negative results do not preclude SARS-CoV-2 infection, do not rule out co-infections with other pathogens, and should not be used as the sole basis for treatment or other patient management decisions. Negative results must be combined with clinical observations, patient history, and epidemiological information. The expected result is Negative.  Fact Sheet for  Patients: SugarRoll.be  Fact Sheet for Healthcare Providers: https://www.woods-mathews.com/  This test is not yet approved or cleared by the [place] FDA and  has been authorized for detection and/or diagnosis of SARS-CoV-2 by FDA under an Emergency Use Authorization (EUA). This EUA will remain  in effect (meaning this test can be used) for the duration of the COVID-19 declaration under Se ction 564(b)(1) of the Act, 21 U.S.C. section 360bbb-3(b)(1), unless the authorization is terminated or revoked sooner.  Performed at [facility], [place] [address], [place], [place] [address]    Radiology Studies: No results found.  Scheduled Meds: . atorvastatin  80 mg Oral Daily  . carvedilol  3.125 mg Oral BID WC  . Chlorhexidine Gluconate Cloth  6 each Topical Daily  . clopidogrel  75 mg Oral Q breakfast  . ezetimibe  10 mg Oral Daily  . gabapentin  100 mg Oral Q8H  . insulin aspart  0-15 Units Subcutaneous TID WC  . insulin aspart  0-5 Units Subcutaneous QHS  . insulin aspart  3 Units Subcutaneous TID WC  . insulin glargine  15 Units Subcutaneous QHS  . isosorbide mononitrate  30 mg Oral Daily  . multivitamin with minerals  1 tablet Oral Daily  . Ensure Max Protein  11 oz Oral BID  . rivaroxaban  20 mg Oral Daily  . sodium chloride flush  3 mL Intravenous Q12H  . sodium chloride  1 g Oral BID WC  . spironolactone  25 mg Oral Daily   Continuous Infusions: . sodium chloride    . sodium chloride 0.9 %       LOS: 5 days    Time spent: 25 mins.    Shawna Clamp, MD [facility]   If 7PM-7AM, please contact night-coverage

## 2020-07-18 NOTE — Progress Notes (Signed)
Progress Note  Patient Name: Bryan Scott Date of Encounter: 07/18/2020  [facility] Cardiologist: [facility]  Subjective   Patient doing okay, denies chest pain or shortness of breath. Overall feels weak, working with physical therapy, currently waiting for a bed at rehab facility. Has no other concerns at this time, he is very appreciative of the care obtained from the cardiology service, especially with his stents placement by Dr. Saunders Revel.  Inpatient Medications    Scheduled Meds: . atorvastatin  80 mg Oral Daily  . carvedilol  3.125 mg Oral BID WC  . Chlorhexidine Gluconate Cloth  6 each Topical Daily  . clopidogrel  75 mg Oral Q breakfast  . ezetimibe  10 mg Oral Daily  . gabapentin  100 mg Oral Q8H  . insulin aspart  0-15 Units Subcutaneous TID WC  . insulin aspart  0-5 Units Subcutaneous QHS  . insulin aspart  3 Units Subcutaneous TID WC  . insulin glargine  15 Units Subcutaneous QHS  . isosorbide mononitrate  30 mg Oral Daily  . multivitamin with minerals  1 tablet Oral Daily  . Ensure Max Protein  11 oz Oral BID  . rivaroxaban  20 mg Oral Daily  . sodium chloride flush  3 mL Intravenous Q12H  . sodium chloride  1 g Oral BID WC  . spironolactone  25 mg Oral Daily   Continuous Infusions: . sodium chloride    . sodium chloride 0.9 %     PRN Meds: sodium chloride, acetaminophen, loperamide, magic mouthwash, midazolam, morphine injection, nitroGLYCERIN, ondansetron (ZOFRAN) IV, sodium chloride flush, zolpidem   Vital Signs    Vitals:   07/17/20 1943 07/18/20 0346 07/18/20 0744 07/18/20 1146  BP: 126/84 (!) 143/73 116/76 119/75  Pulse: 74 70 65 73  Resp: 18 20 17 18   Temp: (!) 97.5 F (36.4 C) 98.6 F (37 C) 98.3 F (36.8 C) 98.3 F (36.8 C)  TempSrc:  Oral    SpO2: 99% 98% 97% 98%  Weight:  81 kg    Height:        Intake/Output Summary (Last 24 hours) at 07/18/2020 1231 Last data filed at 07/18/2020 0954 Gross per 24 hour  Intake 1440 ml  Output --  Net  1440 ml   Last 3 Weights 07/18/2020 07/16/2020 07/15/2020  Weight (lbs) 178 lb 8 oz 176 lb 12.8 oz 177 lb 14.4 oz  Weight (kg) 80.967 kg 80.196 kg 80.695 kg      Telemetry    Sinus rhythm- Personally Reviewed  ECG    No new tracing- Personally Reviewed  Physical Exam   GEN: No acute distress.   Neck: No JVD Cardiac: RRR Respiratory:  Poor inspiratory effort GI: Soft, nontender, non-distended  MS: No edema; No deformity. Neuro:  Nonfocal  Psych: Normal affect   Labs    High Sensitivity Troponin:   Recent Labs  Lab 07/11/20 1710 07/11/20 1843 07/11/20 2037 07/11/20 2238 07/12/20 0052  TROPONINIHS 100* 107* 103* 89* 89*      Chemistry Recent Labs  Lab 07/15/20 0444 07/16/20 0440 07/17/20 0648 07/18/20 0540  NA 129* 129* 133* 133*  K 4.2 3.9 4.0 3.9  CL 98 96* 101 100  CO2 17* 20* 22 21*  GLUCOSE 357* 326* 245* 205*  BUN 39* 43* 32* 27*  CREATININE 1.28* 1.29* 1.12 0.97  CALCIUM 9.7 9.8 9.6 9.3  PROT 7.6  --   --   --   ALBUMIN 3.6  --   --   --  AST 21  --   --   --   ALT 29  --   --   --   ALKPHOS 70  --   --   --   BILITOT 2.2*  --   --   --   GFRNONAA 59* 58* >60 >60  ANIONGAP 14 13 10 12      Hematology Recent Labs  Lab 07/14/20 0317 07/15/20 0444 07/16/20 0440 07/18/20 0540  WBC 13.5* 12.9* 13.0*  --   RBC 5.58 5.23 5.24  --   HGB 18.2* 17.3* 17.0 17.0  HCT 52.1* 48.1 48.0 48.1  MCV 93.4 92.0 91.6  --   MCH 32.6 33.1 32.4  --   MCHC 34.9 36.0 35.4  --   RDW 12.0 11.8 11.9  --   PLT 183 195 189  --     BNPNo results for input(s): BNP, PROBNP in the last 168 hours.   DDimer No results for input(s): DDIMER in the last 168 hours.   Radiology    No results found.  Cardiac Studies   [facility] 07/13/2020 Conclusions: 1. Multivessel coronary artery and bypass graft disease, as detailed in Dr. Donivan Scull diagnostic catheterization report.  Culprit lesion appears to be 95% stenosis in SVG-RCA. 2. Successful PCI to SVG-RCA using Resolute Onyx  3.5 x 18 mm drug-eluting stent with 0% residual stenosis and TIMI-3 flow.  Recommendations: 1. Remove right femoral artery sheath 2 hours after discontinuation of bivalirudin. 2. Restart heparin 8 hours after sheath removal.  Transition to rivaroxaban tomorrow if no evidence of bleeding/vascular complication. 3. Continue clopidogrel for 12 months (defer aspirin in the setting of aspirin allergy on chronic anticoagulation with rivaroxaban). 4. Aggressive secondary prevention.    Patient Profile     75 y.o. male with history of CAD/CABG x3, diabetes, paroxysmal A. fib on Xarelto, presenting with chest pain and shortness of breath. Found to have severe stenosis in his vein graft to RCA. S/p PCI to SVG-RCA.  Assessment & Plan    1. CAD/PCI to SVG-RCA -Currently asymptomatic -Plavix, Xarelto -Coreg, Lipitor -Waiting bed for rehab facility. Continue PT OT while in-house.  2. Ischemic cardiomyopathy, EF 40 to 45% -Coreg, Aldactone -Low blood pressures preventing up titration of meds at this time. This can be performed as outpatient  3. Paroxysmal atrial fibrillation -Currently in sinus rhythm -Xarelto  Please let us know if additional input is needed. Patient can be discharged on current cardiac medications when rehab bed placement is found. Cardiology will sign off.   Total encounter time 35 minutes  Greater than 50% was spent in counseling and coordination of care with the patient     Signed, Kate Sable, MD  07/18/2020, 12:31 PM

## 2020-07-18 NOTE — Progress Notes (Signed)
PT Cancellation Note  Patient Details Name: Bryan Scott MRN: [id number] DOB: April 21, 1946   Cancelled Treatment:    Reason Eval/Treat Not Completed: Patient declined, no reason specified Pt reports that he had recently done a little in room walking with the mobility tech and did not feel up to doing any more today. Offers to come back later were refused, will maintain on PT caseload and see as appropriate.  Kreg Shropshire, DPT 07/18/2020, 5:01 PM

## 2020-07-18 NOTE — Plan of Care (Signed)
  Problem: Clinical Measurements: Goal: Ability to maintain clinical measurements within normal limits will improve Outcome: Progressing Goal: Will remain free from infection Outcome: Progressing   Problem: Activity: Goal: Risk for activity intolerance will decrease Outcome: Progressing   Problem: Nutrition: Goal: Adequate nutrition will be maintained Outcome: Progressing   

## 2020-07-18 NOTE — Progress Notes (Addendum)
Inpatient Diabetes Program Recommendations  AACE/ADA: New Consensus Statement on Inpatient Glycemic Control (2015)  Target Ranges:  Prepandial:   less than 140 mg/dL      Peak postprandial:   less than 180 mg/dL (1-2 hours)      Critically ill patients:  140 - 180 mg/dL   Lab Results  Component Value Date   GLUCAP 246 (H) 07/18/2020   HGBA1C 9.5 (H) 07/16/2020    Review of Glycemic Control Results for Bryan Scott, Bryan Scott (MRN [id number]) as of 07/18/2020 13:27  Ref. Range 07/17/2020 09:29 07/17/2020 12:37 07/17/2020 16:12 07/17/2020 21:18 07/18/2020 07:44 07/18/2020 11:45  Glucose-Capillary Latest Ref Range: 70 - 99 mg/dL 252 (H) 316 (H) 320 (H) 186 (H) 238 (H) 246 (H)   Diabetes history:  DM2 Outpatient Diabetes medications:  Jardiance 25 mg + Glucotrol 10 mg bid + Metformin 500 mg bid Current orders for Inpatient glycemic control:  Lantus 15 units QHS Novolog 0-15 units TID  Novolog 0-5 QHS Novolog 3 units TID with meals  Inpatient Diabetes Program Recommendations:    Lantus 20 units QHS Novolog 5 units TID with meals   Will continue to follow while inpatient.  Thank you, Reche Dixon, RN, BSN Diabetes Coordinator Inpatient Diabetes Program [phone] (team pager from 8a-5p)

## 2020-07-18 NOTE — Plan of Care (Signed)
  Problem: Education: Goal: Knowledge of General Education information will improve Description Including pain rating scale, medication(s)/side effects and non-pharmacologic comfort measures Outcome: Progressing   Problem: Health Behavior/Discharge Planning: Goal: Ability to manage health-related needs will improve Outcome: Progressing   

## 2020-07-18 NOTE — Progress Notes (Signed)
Mobility Specialist - Progress Note   07/18/20 1400  Mobility  Activity Ambulated in room  Range of Motion/Exercises Right leg;Left leg (HR, SLR)  Level of Assistance Minimal assist, patient does 75% or more  Assistive Device Front wheel walker  Distance Ambulated (ft) 20 ft  Mobility Response Tolerated well  Mobility performed by Mobility specialist  $Mobility charge 1 Mobility    Pre-mobility: 74 HR, 97% SpO2 During mobility: 81 HR, 98% SpO2 Post-mobility: 75 HR, 98% SpO2   Pt was sitting in recliner upon arrival utilizing room air. Pt agreed to session. Pt denied nausea and fatigue, but does c/o weakness and pain in neck/lower back "7/10". Pt stood to RW with minA. Pt c/o dizziness upon standing. A rest break was taken to resolve issue. Pt proceeded to ambulate 20' in room with CGA. Pt slightly unsteady with a total of 3 short rest breaks taken during ambulation. Mild LOB noted x1 while turning with RW. VC needed for sequencing. After ~10', pt began c/o of dizziness again and another rest break was taken. Further OOB mobility limited d/t dizziness and fatigue. Upon return to recliner, pt rated his RPE a 9/10. Pt participated in seated exercises: ankle pumps x10, and straight leg raises x5. Pt educated on the importance of therex activity to regain/retain strength. Pt showed understanding. Pt voiced "that wore me out" after session. Overall, pt tolerated session well. Pt was left in bed with all needs in reach. Nurse notified.    Kathee Delton Mobility Specialist 07/18/20, 2:29 PM

## 2020-07-19 DIAGNOSIS — I214 Non-ST elevation (NSTEMI) myocardial infarction: Secondary | ICD-10-CM | POA: Diagnosis not present

## 2020-07-19 LAB — BASIC METABOLIC PANEL
Anion gap: 8 (ref 5–15)
BUN: 28 mg/dL — ABNORMAL HIGH (ref 8–23)
CO2: 24 mmol/L (ref 22–32)
Calcium: 9.3 mg/dL (ref 8.9–10.3)
Chloride: 101 mmol/L (ref 98–111)
Creatinine, Ser: 1.06 mg/dL (ref 0.61–1.24)
GFR, Estimated: >60 mL/min (ref >60)
Glucose, Bld: 198 mg/dL — ABNORMAL HIGH (ref 70–99)
Potassium: 4 mmol/L (ref 3.5–5.1)
Sodium: 133 mmol/L — ABNORMAL LOW (ref 135–145)

## 2020-07-19 LAB — CBC
HCT: 46.4 % (ref 39.0–52.0)
Hemoglobin: 16.5 g/dL (ref 13.0–17.0)
MCH: 33.1 pg (ref 26.0–34.0)
MCHC: 35.6 g/dL (ref 30.0–36.0)
MCV: 93.2 fL (ref 80.0–100.0)
Platelets: 182 10*3/uL (ref 150–400)
RBC: 4.98 MIL/uL (ref 4.22–5.81)
RDW: 11.9 % (ref 11.5–15.5)
WBC: 13.2 10*3/uL — ABNORMAL HIGH (ref 4.0–10.5)
nRBC: 0 % (ref 0.0–0.2)

## 2020-07-19 LAB — PHOSPHORUS: Phosphorus: 2.7 mg/dL (ref 2.5–4.6)

## 2020-07-19 LAB — GLUCOSE, CAPILLARY
Glucose-Capillary: 201 mg/dL — ABNORMAL HIGH (ref 70–99)
Glucose-Capillary: 257 mg/dL — ABNORMAL HIGH (ref 70–99)
Glucose-Capillary: 165 mg/dL — ABNORMAL HIGH (ref 70–99)
Glucose-Capillary: 246 mg/dL — ABNORMAL HIGH (ref 70–99)

## 2020-07-19 LAB — MAGNESIUM: Magnesium: 1.8 mg/dL (ref 1.7–2.4)

## 2020-07-19 MED ORDER — CLOPIDOGREL BISULFATE 75 MG PO TABS: 75.0000 mg | ORAL_TABLET | Freq: Every day | ORAL | 1 refills | Status: DC

## 2020-07-19 MED ORDER — INSULIN GLARGINE 100 UNIT/ML ~~LOC~~ SOLN
20.0000 [IU] | Freq: Every day | SUBCUTANEOUS | Status: DC
  Administered 2020-07-19 – 2020-07-20 (×2): 20 [IU] via SUBCUTANEOUS
  Filled 2020-07-19 (×3): qty 0.2

## 2020-07-19 MED ORDER — INSULIN ASPART 100 UNIT/ML ~~LOC~~ SOLN
5.0000 [IU] | Freq: Three times a day (TID) | SUBCUTANEOUS | Status: DC
  Administered 2020-07-19 – 2020-07-21 (×4): 5 [IU] via SUBCUTANEOUS
  Filled 2020-07-19 (×4): qty 1

## 2020-07-19 MED ORDER — CARVEDILOL 3.125 MG PO TABS: 3.1250 mg | ORAL_TABLET | Freq: Two times a day (BID) | ORAL | 1 refills | Status: DC

## 2020-07-19 MED ORDER — NITROGLYCERIN 0.4 MG SL SUBL: 0.4000 mg | SUBLINGUAL_TABLET | SUBLINGUAL | 1 refills | Status: DC | PRN

## 2020-07-19 NOTE — TOC Transition Note (Signed)
Transition of Care [facility]) - CM/SW Discharge Note   Patient Details  Name: Bryan Scott MRN: [id number] Date of Birth: December 25, 1945  [facility] CM/SW Contact:  Kerin Salen, RN Phone Number: 07/19/2020, 12:19 PM    Final next level of care: [place] Barriers to Discharge: Barriers Resolved   Patient Goals and CMS Choice Patient states their goals for this hospitalization and ongoing recovery are:: To return home   Choice offered to / list presented to : NA  Discharge Placement                Patient to be transferred to facility by: Family Member Name of family member notified: Patient to contact family Patient and family notified of of transfer: 07/19/20  Discharge Plan and Services                DME Arranged: N/A DME Agency: NA       HH Arranged: PT,OT [place] Agency: [facility] Date [place] Agency Contacted: 07/19/20 Time [place]: 1218    Social Determinants of Health (SDOH) Interventions     Readmission Risk Interventions No flowsheet data found.

## 2020-07-19 NOTE — Progress Notes (Signed)
Physical Therapy Treatment Patient Details Name: Bryan Scott MRN: [id number] DOB: [birth year] Today's Date: 07/19/2020    History of Present Illness Patient is a 75 year old male wiht PMH significant for CAD s/p CABG, hypertension, paroxysmal A. fib on anticoagulation, type 2 diabetes, hyperlipidemia and history of bladder cancer admitted with persistent chest pain and some blurred vision. Patient is admitted for non-STEMI, He underwent left heart cath with PCI.    PT Comments    Pt is making good progress towards goals with improved ability to ambulate in hallway with safe technique and reciprocal gait pattern. O2 sats at 97% on RA post exertion with HR at 74. Pt very motivated and safe for home discharge reporting he will have supervision from his 3 sisters. Updated MD and updated disposition.   Follow Up Recommendations  Home health PT     Equipment Recommendations  None recommended by PT    Recommendations for Other Services       Precautions / Restrictions Precautions Precautions: Fall Restrictions Weight Bearing Restrictions: No    Mobility  Bed Mobility               General bed mobility comments: not performed as pt received in recliner  Transfers Overall transfer level: Needs assistance Equipment used: Rolling walker (2 wheeled) Transfers: Sit to/from Stand Sit to Stand: Supervision         General transfer comment: Safe technique with upright posture. All mobility performed on RA.  Ambulation/Gait Ambulation/Gait assistance: Min guard Gait Distance (Feet): 100 Feet Assistive device: Rolling walker (2 wheeled) Gait Pattern/deviations: Step-through pattern     General Gait Details: ambulated in hallway with safe technique. 1 bout of unsteadiness noted, however no formal LOB. Does fatigue and request to return back to room.   Stairs             Wheelchair Mobility    Modified Rankin (Stroke Patients Only)       Balance Overall balance  assessment: Needs assistance Sitting-balance support: No upper extremity supported;Feet unsupported Sitting balance-Leahy Scale: Good     Standing balance support: Bilateral upper extremity supported;During functional activity Standing balance-Leahy Scale: Good                              Cognition Arousal/Alertness: Awake/alert Behavior During Therapy: WFL for tasks assessed/performed Overall Cognitive Status: Within Functional Limits for tasks assessed                                        Exercises Other Exercises Other Exercises: reports he is too fatigued to perform further ther-ex    General Comments        Pertinent Vitals/Pain Pain Assessment: No/denies pain    Home Living                      Prior Function            PT Goals (current goals can now be found in the care plan section) Acute Rehab PT Goals Patient Stated Goal: to get home PT Goal Formulation: With patient Time For Goal Achievement: 07/30/20 Potential to Achieve Goals: Fair Progress towards PT goals: Progressing toward goals    Frequency    Min 2X/week      PT Plan Discharge plan needs to be updated  Co-evaluation              AM-PAC PT "6 Clicks" Mobility   Outcome Measure  Help needed turning from your back to your side while in a flat bed without using bedrails?: A Little Help needed moving from lying on your back to sitting on the side of a flat bed without using bedrails?: A Little Help needed moving to and from a bed to a chair (including a wheelchair)?: A Little Help needed standing up from a chair using your arms (e.g., wheelchair or bedside chair)?: A Little Help needed to walk in hospital room?: A Little Help needed climbing 3-5 steps with a railing? : A Little 6 Click Score: 18    End of Session Equipment Utilized During Treatment: Gait belt Activity Tolerance: Patient tolerated treatment well Patient left: in  chair Nurse Communication: Mobility status PT Visit Diagnosis: Unsteadiness on feet (R26.81);Other abnormalities of gait and mobility (R26.89);Muscle weakness (generalized) (M62.81);History of falling (Z91.81);Difficulty in walking, not elsewhere classified (R26.2)     Time: 0350-0938 PT Time Calculation (min) (ACUTE ONLY): 16 min  Charges:  $Gait Training: 8-22 mins                     Greggory Stallion, [place], DPT [phone]    Miraj Truss 07/19/2020, 1:03 PM

## 2020-07-19 NOTE — TOC Progression Note (Signed)
Transition of Care [facility]) - Progression Note    Patient Details  Name: Bryan Scott MRN: [id number] Date of Birth: 1945/10/03  Transition of Care [facility]) CM/SW [place], RN Phone Number: 07/19/2020, 3:08 PM  Clinical Narrative:   Patient voices not being able to go home until Saturday. Family is getting his trailor prepared for him to live in, need utilities turned on and furniture. Patient without any other place to stay until Saturday. Attending aware, patient to be discharged Saturday.      Barriers to Discharge: Barriers Resolved  Expected Discharge Plan and Services           Expected Discharge Date: 07/19/20               DME Arranged: N/A DME Agency: NA       HH Arranged: PT,OT [place] Agency: [facility] Date [place] Agency Contacted: 07/19/20 Time [place]: 1218     Social Determinants of Health (SDOH) Interventions    Readmission Risk Interventions No flowsheet data found.

## 2020-07-19 NOTE — Discharge Instructions (Signed)
Advised to follow-up with primary care physician in 1 week. Advised to follow-up with cardiology as scheduled. Patient was admitted for non-STEMI underwent PCI with successful stenting.   Patient has been discharged on Plavix and Xarelto.Marland Kitchen

## 2020-07-19 NOTE — Discharge Summary (Signed)
Physician Discharge Summary  Bryan Scott CHE:[id number] DOB: [birth year] DOA: 07/11/2020  PCP: [name], MD  Admit date: 07/11/2020   Discharge date: 07/21/2020.  Admitted From: Home.  Disposition:  Home with home services.  Recommendations for Outpatient Follow-up:  1. Follow up with PCP in 1-2 weeks. 2. Please obtain BMP/CBC in one week. 3. Advised to follow-up with cardiology as scheduled. 4. Patient was admitted for NSTEMI and underwent PCI with successful stenting.   5. Patient has been discharged on Plavix and Xarelto..  Home Health: PT/OT Equipment/Devices: NONE  Discharge Condition: Stable CODE STATUS:Full code Diet recommendation: Heart Healthy   Brief [facility] Psychiatric Course: This 75 years old male with PMH significant for CAD s/p CABG, hypertension, paroxysmal A. fib on anticoagulation, type 2 diabetes, hyperlipidemia and history of bladder cancer admitted with persistent chest pain and some blurred vision.  Patient is admitted for non-STEMI, Cardiology was consulted. He was started on heparin gtt.  He underwent left heart cathetrization with successful PCI.  Patient is started on Plavix and Xarelto post cath. Patient does have allergies to Aspirin,  Chest pain has resolved but patient developed difficulty swallowing, sore throat and pain.  ENT was consulted. No acute intervention needed at this time.  Follow-up outpatient ENT and GI for dysphagia. Patient throat pain, dysphagia has resolved.  PT recommended skilled nursing facility,  awaiting placement.  Patient has significantly improved,  participated in physical therapy.  Home with home services been arranged.  Patient is cleared from cardiology and patient is being discharged home.  He was managed for below problems.   Discharge Diagnoses:  Principal Problem:   NSTEMI (non-ST elevated myocardial infarction) ([place]) Active Problems:   Type 2 diabetes mellitus without complication (HCC)   Hyperlipidemia    Essential hypertension   PAF (paroxysmal atrial fibrillation) (HCC)   Hyponatremia   CAD (coronary artery disease)   Leukocytosis   AKI (acute kidney injury) ([place])  Chest pain sec. to NSTEMI / Coronary artery disease Patient with known history of CAD status post CABG. He follows up with cardiology at the [facility]. EKG does not show any acute findings Initial troponin of 67 -->107->89.  Nuclear stress test back in June 2021 was low risk. Cardiology consulted, patient underwent left heart cath with multivessel CAD.  Successful PCI to SVG. Transitioned from IV heparin to Xarelto.  Patient started on Plavix, continue for at least 12 months.   No aspirin in the setting of allergies.  Continue chronic anticoagulation with Xarelto. Patient cleared from cardiology to be discharged.  Blurry vision/tinnitus No focal Neurological findings - Low suspicion of CVA. Patient had CT head back in March 2021 for dizziness and had paranasal sinus disease which could explain his current symptoms.  MRI brain negative for CVA. monitor symptoms - outpt f/up with ENT/Ophthal  Hyponatremia:  Suspect due to hypovolemia. Na 132->134->129> 133 Continue sodium tablet 1 gm TID.  AKI-present on admission and now resolved. Likely prerenal given hyponatremia and hemoconcentrated hemoglobin and hematocrit Discontinue IV 75 cc NS infusion.  Elevated anion gap-POA and resolved.  Suspect from hypovolemia. follow with repeat BMP after fluids.   Leukocytosis Unclear etiology. No signs of infection Follow repeat CBC in the morning after fluids.  Essential hypertension -BP minimally elevated.  Diabetes mellitus, type II -Last HbA1C of 8 in 04/2020. Continue regular Insulin sliding scale.  History of paroxysmal atrial fibrillation -Hold Xareto while on heparin gtt. Last dose morning of 1/26.  Xarelto resumed post cath as per cardiology  Hyperlipidemia -Continue statin  Pancreatic mass F[facility]  - Work up in progress   Discharge Instructions  Discharge Instructions    AMB Referral to Cardiac Rehabilitation - Phase II   Complete by: As directed    Diagnosis:  Coronary Stents NSTEMI     After initial evaluation and assessments completed: Virtual Based Care may be provided alone or in conjunction with Phase 2 Cardiac Rehab based on patient barriers.: Yes   Call MD for:  difficulty breathing, headache or visual disturbances   Complete by: As directed    Call MD for:  persistant dizziness or light-headedness   Complete by: As directed    Call MD for:  persistant nausea and vomiting   Complete by: As directed    Diet - low sodium heart healthy   Complete by: As directed    Diet - low sodium heart healthy   Complete by: As directed    Diet Carb Modified   Complete by: As directed    Diet Carb Modified   Complete by: As directed    Discharge instructions   Complete by: As directed    Advised to follow-up with primary care physician in 1 week. Advised to follow-up with cardiology as scheduled. Patient was admitted for non-STEMI underwent PCI with successful stenting.   Patient has been discharged on Plavix and Xarelto.Marland Kitchen   Discharge instructions   Complete by: As directed    Advised to follow-up with primary care physician in 1 week. Advised to follow-up with cardiology as scheduled. Advised to take Plavix  at least for 12 months. Advised to continue Xarelto for anticoagulation.   Increase activity slowly   Complete by: As directed    Increase activity slowly   Complete by: As directed      Allergies as of 07/21/2020      Reactions   Aspergum [aspirin] Anaphylaxis   Heparin Other (See Comments)   Reaction:  Bleeding    Lisinopril Swelling      Medication List    STOP taking these medications   omeprazole 20 MG tablet Commonly known as: PRILOSEC OTC     TAKE these medications   amLODipine 5 MG tablet Commonly known as: NORVASC Take 1 tablet (5 mg total) by  mouth daily. What changed: how much to take   ascorbic acid 500 MG tablet Commonly known as: VITAMIN C Take 500 mg by mouth daily.   atorvastatin 80 MG tablet Commonly known as: LIPITOR Take 80 mg by mouth daily.   carvedilol 3.125 MG tablet Commonly known as: COREG Take 1 tablet (3.125 mg total) by mouth 2 (two) times daily with a meal.   chlorthalidone 25 MG tablet Commonly known as: HYGROTON Take 1 tablet by mouth daily.   clopidogrel 75 MG tablet Commonly known as: PLAVIX Take 1 tablet (75 mg total) by mouth daily with breakfast.   ezetimibe 10 MG tablet Commonly known as: ZETIA Take 1 tablet (10 mg total) by mouth daily.   gabapentin 100 MG capsule Commonly known as: NEURONTIN Take 100 mg by mouth 3 (three) times daily.   glipiZIDE 10 MG tablet Commonly known as: GLUCOTROL Take 10 mg by mouth 2 (two) times daily before a meal.   guaiFENesin-dextromethorphan 100-10 MG/5ML syrup Commonly known as: ROBITUSSIN DM Take 5 mLs by mouth every 4 (four) hours as needed for cough.   hydrALAZINE 25 MG tablet Commonly known as: APRESOLINE Take 25 mg by mouth 3 (three) times daily.   isosorbide mononitrate  30 MG 24 hr tablet Commonly known as: IMDUR Take 1 tablet (30 mg total) by mouth daily.   Jardiance 25 MG Tabs tablet Generic drug: empagliflozin Take 25 mg by mouth daily.   metFORMIN 500 MG (MOD) 24 hr tablet Commonly known as: GLUMETZA Take 500 mg by mouth 2 (two) times daily with a meal.   morphine 15 MG 12 hr tablet Commonly known as: MS CONTIN Take 15 mg by mouth every 12 (twelve) hours as needed for pain.   nitroGLYCERIN 0.4 MG SL tablet Commonly known as: NITROSTAT Place 1 tablet (0.4 mg total) under the tongue every 5 (five) minutes as needed for chest pain.   ondansetron 8 MG tablet Commonly known as: ZOFRAN Take 8 mg by mouth every 8 (eight) hours as needed for nausea or vomiting.   rivaroxaban 20 MG Tabs tablet Commonly known as: XARELTO Take  1 tablet (20 mg total) by mouth daily with supper.   spironolactone 25 MG tablet Commonly known as: ALDACTONE Take 1 tablet (25 mg total) by mouth daily.   Vitamin D3 125 MCG (5000 UT) Caps Take 5,000 Units by mouth daily.   ZINC 15 PO Take 1 tablet by mouth daily.       Follow-up Information    [name], MD Follow up in 1 week(s).   Specialty: Family Medicine Contact information: [place]  [address] [phone]        Nelva Bush, MD Follow up in 1 week(s).   Specialty: Cardiology Contact information: [place] [address] [phone]              Allergies  Allergen Reactions  . 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 Heparin Other (See Comments)    Reaction:  Bleeding   . Lisinopril Swelling    Consultations:  Cardiology  ENT   Procedures/Studies: DG Chest 2 View  Result Date: 07/11/2020 CLINICAL DATA:  Chest pain, shortness of breath, anemia EXAM: CHEST - 2 VIEW COMPARISON:  12/12/2019 FINDINGS: Frontal and lateral views of the chest demonstrate postsurgical changes from previous CABG. Cardiac silhouette is stable. There is background emphysema, without airspace disease, effusion, or pneumothorax. No acute bony abnormalities. IMPRESSION: 1. Emphysema, no acute airspace disease. Electronically Signed   By: Randa Ngo M.D.   On: 07/11/2020 15:52   MR BRAIN W WO CONTRAST  Result Date: 07/12/2020 CLINICAL DATA:  Dizziness.  Diplopia.  Visual disturbance. EXAM: MRI HEAD WITHOUT AND WITH CONTRAST TECHNIQUE: Multiplanar, multiecho pulse sequences of the brain and surrounding structures were obtained without and with intravenous contrast. CONTRAST:  74mL GADAVIST GADOBUTROL 1 MMOL/ML IV SOLN COMPARISON:  09/09/2019 FINDINGS: Brain: The brain has a normal appearance without evidence of malformation, atrophy, old or acute small or large vessel infarction, mass lesion, hemorrhage, hydrocephalus or  extra-axial collection. Vascular: Major vessels at the base of the brain show flow. Venous sinuses appear patent. Skull and upper cervical spine: Normal. Sinuses/Orbits: Chronic complete opacification of both maxillary sinuses. Small left mastoid effusion. Orbits appear normal. Other: None significant. IMPRESSION: 1. Normal appearance of the brain itself. No cause of the presenting symptoms is identified. 2. Chronic complete opacification of both maxillary sinuses. Small left mastoid effusion. Electronically Signed   By: Nelson Chimes M.D.   On: 07/12/2020 12:20   CARDIAC CATHETERIZATION  Result Date: 07/13/2020  Prox RCA to Dist RCA lesion is 100% stenosed.  Dist Graft lesion is 95% stenosed.  Ost LM to Mid LM lesion is  40% stenosed.  Ost Cx to Prox Cx lesion is 80% stenosed.  Mid Graft lesion is 20% stenosed.  Mid Cx lesion is 100% stenosed.  RPDA lesion is 90% stenosed.  Origin lesion is 100% stenosed.  The left ventricular ejection fraction is greater than 65% by visual estimate.  LV end diastolic pressure is normal.  The left ventricular systolic function is normal.  There is no aortic valve stenosis.    CARDIAC CATHETERIZATION  Addendum Date: 07/13/2020   Conclusions: 1. Multivessel coronary artery and bypass graft disease, as detailed in Dr. Donivan Scull diagnostic catheterization report.  Culprit lesion appears to be 95% stenosis in SVG-RCA. 2. Successful PCI to SVG-RCA using Resolute Onyx 3.5 x 18 mm drug-eluting stent with 0% residual stenosis and TIMI-3 flow. Recommendations: 1. Remove right femoral artery sheath 2 hours after discontinuation of bivalirudin. 2. Restart heparin 8 hours after sheath removal.  Transition to rivaroxaban tomorrow if no evidence of bleeding/vascular complication. 3. Continue clopidogrel for 12 months (defer aspirin in the setting of aspirin allergy on chronic anticoagulation with rivaroxaban). 4. Aggressive secondary prevention. Nelva Bush, MD [facility]    Result Date: 07/13/2020 Conclusions: 1. Multivessel coronary artery and bypass graft disease, as detailed in Dr. Donivan Scull diagnostic catheterization report.  Culprit lesion appears to be 95% stenosis in SVG-RCA. 2. Successful PCI to SVG-RCA using Restolute Onyx 3.5 x 18 mm drug-eluting stent with 0% residual stenosis and TIMI-3 flow. Recommendations: 1. Remove right femoral artery sheath 2 hours after discontinuation of bivalirudin. 2. Restart heparin 8 hours after sheath removal.  Transition to rivaroxaban tomorrow if no evidence of bleeding/vascular complication. 3. Continue clopidogrel for 12 months (defer aspirin in the setting of aspirin allergy on chronic anticoagulation with rivaroxaban). 4. Aggressive secondary prevention. Nelva Bush, MD [facility]   Echocardiogram, LHC with PCI   Subjective: Patient was seen and examined at bedside.  Overnight events noted.  Patient reports feeling much better,  Patient has participated in physical therapy and PT has recommended home with home services.  Patient is being discharged home with home health services. Discharge Exam: Vitals:   07/21/20 0838 07/21/20 1153  BP: (!) 112/99 127/84  Pulse: 71 79  Resp: 16 16  Temp: (!) 97.4 F (36.3 C) (!) 97.5 F (36.4 C)  SpO2: 99% 97%   Vitals:   07/20/20 2121 07/21/20 0331 07/21/20 0838 07/21/20 1153  BP: 121/69 (!) 145/77 (!) 112/99 127/84  Pulse: 66 63 71 79  Resp: 18 18 16 16   Temp: 98.2 F (36.8 C) 97.6 F (36.4 C) (!) 97.4 F (36.3 C) (!) 97.5 F (36.4 C)  TempSrc:  Oral Oral Oral  SpO2: 97% 97% 99% 97%  Weight:  81.3 kg    Height:        General: Pt is alert, awake, not in acute distress Cardiovascular: RRR, S1/S2 +, no rubs, no gallops Respiratory: CTA bilaterally, no wheezing, no rhonchi Abdominal: Soft, NT, ND, bowel sounds + Extremities: no edema, no cyanosis    The results of significant diagnostics from this hospitalization (including imaging, microbiology,  ancillary and laboratory) are listed below for reference.     Microbiology: Recent Results (from the past 240 hour(s))  SARS CORONAVIRUS 2 (TAT 6-24 HRS) Nasopharyngeal Nasopharyngeal Swab     Status: None   Collection Time: 07/11/20  6:05 PM   Specimen: Nasopharyngeal Swab  Result Value Ref Range Status   SARS Coronavirus 2 NEGATIVE NEGATIVE Final    Comment: (NOTE) SARS-CoV-2 target nucleic acids are  NOT DETECTED.  The SARS-CoV-2 RNA is generally detectable in upper and lower respiratory specimens during the acute phase of infection. Negative results do not preclude SARS-CoV-2 infection, do not rule out co-infections with other pathogens, and should not be used as the sole basis for treatment or other patient management decisions. Negative results must be combined with clinical observations, patient history, and epidemiological information. The expected result is Negative.  Fact Sheet for Patients: SugarRoll.be  Fact Sheet for Healthcare Providers: https://www.woods-mathews.com/  This test is not yet approved or cleared by the [place] FDA and  has been authorized for detection and/or diagnosis of SARS-CoV-2 by FDA under an Emergency Use Authorization (EUA). This EUA will remain  in effect (meaning this test can be used) for the duration of the COVID-19 declaration under Se ction 564(b)(1) of the Act, 21 U.S.C. section 360bbb-3(b)(1), unless the authorization is terminated or revoked sooner.  Performed at [facility], [place] [address], [place],  [address]      Labs: BNP (last 3 results) Recent Labs    12/12/19 0518  BNP [id number]   Basic Metabolic Panel: Recent Labs  Lab 07/16/20 0440 07/17/20 0648 07/18/20 0540 07/19/20 0531 07/20/20 0508  NA 129* 133* 133* 133* 130*  K 3.9 4.0 3.9 4.0 3.7  CL 96* 101 100 101 102  CO2 20* 22 21* 24 23  GLUCOSE 326* 245* 205* 198* 154*  BUN 43* 32* 27* 28* 28*   CREATININE 1.29* 1.12 0.97 1.06 0.93  CALCIUM 9.8 9.6 9.3 9.3 9.0  MG 2.0 2.0 1.8 1.8 1.8  PHOS 2.7 3.1 2.7 2.7 3.1   Liver Function Tests: Recent Labs  Lab 07/15/20 0444  AST 21  ALT 29  ALKPHOS 70  BILITOT 2.2*  PROT 7.6  ALBUMIN 3.6   No results for input(s): LIPASE, AMYLASE in the last 168 hours. No results for input(s): AMMONIA in the last 168 hours. CBC: Recent Labs  Lab 07/15/20 0444 07/16/20 0440 07/18/20 0540 07/19/20 0531 07/20/20 0508  WBC 12.9* 13.0*  --  13.2* 12.7*  HGB 17.3* 17.0 17.0 16.5 15.6  HCT 48.1 48.0 48.1 46.4 43.8  MCV 92.0 91.6  --  93.2 92.4  PLT 195 189  --  182 174   Cardiac Enzymes: No results for input(s): CKTOTAL, CKMB, CKMBINDEX, TROPONINI in the last 168 hours. BNP: Invalid input(s): POCBNP CBG: Recent Labs  Lab 07/20/20 1142 07/20/20 1646 07/20/20 2117 07/21/20 0846 07/21/20 1154  GLUCAP 312* 89 326* 186* 176*   D-Dimer No results for input(s): DDIMER in the last 72 hours. Hgb A1c No results for input(s): HGBA1C in the last 72 hours. Lipid Profile No results for input(s): CHOL, HDL, LDLCALC, TRIG, CHOLHDL, LDLDIRECT in the last 72 hours. Thyroid function studies No results for input(s): TSH, T4TOTAL, T3FREE, THYROIDAB in the last 72 hours.  Invalid input(s): FREET3 Anemia work up No results for input(s): VITAMINB12, FOLATE, FERRITIN, TIBC, IRON, RETICCTPCT in the last 72 hours. Urinalysis    Component Value Date/Time   COLORURINE YELLOW (A) 09/09/2019 0946   APPEARANCEUR CLEAR (A) 09/09/2019 0946   APPEARANCEUR Clear 10/15/2013 2200   LABSPEC 1.023 09/09/2019 0946   LABSPEC 1.012 10/15/2013 2200   PHURINE 6.0 09/09/2019 0946   GLUCOSEU >=500 (A) 09/09/2019 0946   GLUCOSEU Negative 10/15/2013 2200   HGBUR NEGATIVE 09/09/2019 0946   BILIRUBINUR NEGATIVE 09/09/2019 0946   BILIRUBINUR Negative 10/15/2013 2200   KETONESUR 5 (A) 09/09/2019 0946   PROTEINUR NEGATIVE 09/09/2019 0946  NITRITE NEGATIVE 09/09/2019  0946   LEUKOCYTESUR NEGATIVE 09/09/2019 0946   LEUKOCYTESUR Negative 10/15/2013 2200   Sepsis Labs Invalid input(s): PROCALCITONIN,  WBC,  LACTICIDVEN Microbiology Recent Results (from the past 240 hour(s))  SARS CORONAVIRUS 2 (TAT 6-24 HRS) Nasopharyngeal Nasopharyngeal Swab     Status: None   Collection Time: 07/11/20  6:05 PM   Specimen: Nasopharyngeal Swab  Result Value Ref Range Status   SARS Coronavirus 2 NEGATIVE NEGATIVE Final    Comment: (NOTE) SARS-CoV-2 target nucleic acids are NOT DETECTED.  The SARS-CoV-2 RNA is generally detectable in upper and lower respiratory specimens during the acute phase of infection. Negative results do not preclude SARS-CoV-2 infection, do not rule out co-infections with other pathogens, and should not be used as the sole basis for treatment or other patient management decisions. Negative results must be combined with clinical observations, patient history, and epidemiological information. The expected result is Negative.  Fact Sheet for Patients: SugarRoll.be  Fact Sheet for Healthcare Providers: https://www.woods-mathews.com/  This test is not yet approved or cleared by the [place] FDA and  has been authorized for detection and/or diagnosis of SARS-CoV-2 by FDA under an Emergency Use Authorization (EUA). This EUA will remain  in effect (meaning this test can be used) for the duration of the COVID-19 declaration under Se ction 564(b)(1) of the Act, 21 U.S.C. section 360bbb-3(b)(1), unless the authorization is terminated or revoked sooner.  Performed at [facility], [place] [address], [place], [place] [address]      Time coordinating discharge: Over 30 minutes  SIGNED:   Shawna Clamp, MD  [facility] 07/21/2020, 1:22 PM Pager   If 7PM-7AM, please contact night-coverage [email or web address]

## 2020-07-19 NOTE — Care Management Important Message (Signed)
Important Message  Patient Details  Name: Bryan Scott MRN: [id number] Date of Birth: [birth year]   Medicare Important Message Given:  Yes     Dannette Barbara 07/19/2020, 1:57 PM

## 2020-07-19 NOTE — Progress Notes (Signed)
PROGRESS NOTE    Bryan Scott  [id number] DOB: Mar 09, 1946 DOA: 07/11/2020 PCP: [name], MD    Brief Narrative:  This 75 years old male with PMH significant for CAD s/p CABG, hypertension, paroxysmal A. fib on anticoagulation, type 2 diabetes, hyperlipidemia and history of bladder cancer admitted with persistent chest pain and some blurred vision.  Patient is admitted for non-STEMI, He underwent left heart cath with PCI.  Patient is started on Plavix and Xarelto post cath.  Chest pain has resolved but patient complains of having difficulty swallowing, sore throat and pain.  ENT consulted. No acute intervention needed at this time.  Follow-up outpatient ENT and GI for dysphagia. PT recommended skilled nursing facility.  Patient has participated in physical therapy and PT has revised the recommendation.  Home with home PT.  Assessment & Plan:   Principal Problem:   NSTEMI (non-ST elevated myocardial infarction) ([place]) Active Problems:   Type 2 diabetes mellitus without complication (HCC)   Hyperlipidemia   Essential hypertension   PAF (paroxysmal atrial fibrillation) (HCC)   Hyponatremia   CAD (coronary artery disease)   Leukocytosis   AKI (acute kidney injury) ([place])   Chest pain sec. to NSTEMI / Coronary artery disease: Patient with known history of CAD status post CABG He follows up with cardiology at the [facility]. EKG does not show any acute findings Initial troponin of 67 -->107->89.  Nuclear stress test back in June 2021 was low risk. Cardiology consulted, patient underwent left heart cath with multivessel CAD.  Successful PCI to SVG. Transitioned from IV heparin to Xarelto.  Patient started on Plavix, continue for at least 12 months.   No aspirin in the setting of allergies.  Continue chronic anticoagulation with Xarelto. Patient cleared from cardiology to be discharged.  Blurry vision/tinnitus No focal Neurological findings - Low suspicion of CVA. Patient had CT head  back in March 2021 for dizziness and had paranasal sinus disease which could explain his current symptoms.  MRI brain negative for CVA. monitor symptoms - outpt f/up with ENT/Ophthal  Hyponatremia:  Suspect due to hypovolemia. Na 132 ->134->129> 133 Continue sodium tablet 1 gm TID  AKI -present on admission and now resolved. Likely prerenal given hyponatremia and hemoconcentrated hemoglobin and hematocrit Discontinue IV 75 cc NS infusion.  Elevated anion gap -POA and resolved.  Suspect from hypovolemia. follow with repeat BMP after fluids.   Leukocytosis Unclear etiology. No signs of infection Follow repeat CBC in the morning after fluids.  Essential hypertension -BP minimally elevated.  Diabetes mellitus, type II -Last HbA1C of 8 in 04/2020. Continue regular Insulin sliding scale.  History of paroxysmal atrial fibrillation -Hold Xareto while on heparin gtt. Last dose morning of 1/26.   Xarelto resumed post cath as per cardiology  Hyperlipidemia -Continue statin  Pancreatic mass F/up at [facility] - Work up in progress   DVT prophylaxis: Xarelto Code Status: Full code Family Communication: No family at bedside Disposition Plan:   Status is: Inpatient  Remains inpatient appropriate because:Inpatient level of care appropriate due to severity of illness   Dispo: The patient is from: Home              Anticipated d/c is to:   [facility] services              Anticipated d/c date is: 2 days ( Saturday)              Patient currently is medically stable for discharge, awaiting SNF placement.  Difficult to place patient No   Consultants:   Cardiology  Procedures: Left heart catheterization. Antimicrobials:   Anti-infectives (From admission, onward)   None      Subjective: Patient was seen and examined at bedside.  Overnight events noted.  Patient reports feeling better,    He reports feeling much improved, denies any chest pain, sob, dizziness.  Patient reports  he has no place to go home,  his family is planning to arrange electricity for his home.  He wants to be discharged Saturday.  Objective: Vitals:   07/19/20 0329 07/19/20 0332 07/19/20 0743 07/19/20 1151  BP: 115/72  117/72 117/79  Pulse: 66  69 78  Resp: 16  17 17   Temp: 98 F (36.7 C)  98.3 F (36.8 C) 98.4 F (36.9 C)  TempSrc: Oral     SpO2: 98%  98% 97%  Weight:  79.7 kg    Height:        Intake/Output Summary (Last 24 hours) at 07/19/2020 1457 Last data filed at 07/19/2020 1330 Gross per 24 hour  Intake 1437 ml  Output --  Net 1437 ml   Filed Weights   07/16/20 0313 07/18/20 0346 07/19/20 0332  Weight: 80.2 kg 81 kg 79.7 kg    Examination:  General exam: Appears calm and comfortable, not in any distress. Respiratory system: Clear to auscultation. Respiratory effort normal. Cardiovascular system: S1 & S2 heard, RRR. No JVD, murmurs, rubs, gallops or clicks. No pedal edema. Gastrointestinal system: Abdomen is nondistended, soft and nontender. No organomegaly or masses felt. Normal bowel sounds heard. Central nervous system: Alert and oriented. No focal neurological deficits. Extremities: Symmetric 5 x 5 power. Skin: No rashes, lesions or ulcers Psychiatry: Judgement and insight appear normal. Mood & affect appropriate.     Data Reviewed: I have personally reviewed following labs and imaging studies  CBC: Recent Labs  Lab 07/13/20 0249 07/14/20 0317 07/15/20 0444 07/16/20 0440 07/18/20 0540 07/19/20 0531  WBC 10.8* 13.5* 12.9* 13.0*  --  13.2*  HGB 18.3* 18.2* 17.3* 17.0 17.0 16.5  HCT 50.8 52.1* 48.1 48.0 48.1 46.4  MCV 90.7 93.4 92.0 91.6  --  93.2  PLT 166 183 195 189  --  258   Basic Metabolic Panel: Recent Labs  Lab 07/15/20 0444 07/16/20 0440 07/17/20 0648 07/18/20 0540 07/19/20 0531  NA 129* 129* 133* 133* 133*  K 4.2 3.9 4.0 3.9 4.0  CL 98 96* 101 100 101  CO2 17* 20* 22 21* 24  GLUCOSE 357* 326* 245* 205* 198*  BUN 39* 43* 32* 27* 28*   CREATININE 1.28* 1.29* 1.12 0.97 1.06  CALCIUM 9.7 9.8 9.6 9.3 9.3  MG 2.2 2.0 2.0 1.8 1.8  PHOS 4.1 2.7 3.1 2.7 2.7   GFR: Estimated Creatinine Clearance: 67.1 mL/min (by C-G formula based on SCr of 1.06 mg/dL). Liver Function Tests: Recent Labs  Lab 07/15/20 0444  AST 21  ALT 29  ALKPHOS 70  BILITOT 2.2*  PROT 7.6  ALBUMIN 3.6   No results for input(s): LIPASE, AMYLASE in the last 168 hours. No results for input(s): AMMONIA in the last 168 hours. Coagulation Profile: No results for input(s): INR, PROTIME in the last 168 hours. Cardiac Enzymes: No results for input(s): CKTOTAL, CKMB, CKMBINDEX, TROPONINI in the last 168 hours. BNP (last 3 results) No results for input(s): PROBNP in the last 8760 hours. HbA1C: No results for input(s): HGBA1C in the last 72 hours. CBG: Recent Labs  Lab 07/18/20 1145 07/18/20  [place] 07/18/20 2142 07/19/20 0742 07/19/20 1149  GLUCAP 246* 267* 190* 201* 257*   Lipid Profile: No results for input(s): CHOL, HDL, LDLCALC, TRIG, CHOLHDL, LDLDIRECT in the last 72 hours. Thyroid Function Tests: No results for input(s): TSH, T4TOTAL, FREET4, T3FREE, THYROIDAB in the last 72 hours. Anemia Panel: No results for input(s): VITAMINB12, FOLATE, FERRITIN, TIBC, IRON, RETICCTPCT in the last 72 hours. Sepsis Labs: No results for input(s): PROCALCITON, LATICACIDVEN in the last 168 hours.  Recent Results (from the past 240 hour(s))  SARS CORONAVIRUS 2 (TAT 6-24 HRS) Nasopharyngeal Nasopharyngeal Swab     Status: None   Collection Time: 07/11/20  6:05 PM   Specimen: Nasopharyngeal Swab  Result Value Ref Range Status   SARS Coronavirus 2 NEGATIVE NEGATIVE Final    Comment: (NOTE) SARS-CoV-2 target nucleic acids are NOT DETECTED.  The SARS-CoV-2 RNA is generally detectable in upper and lower respiratory specimens during the acute phase of infection. Negative results do not preclude SARS-CoV-2 infection, do not rule out co-infections with other  pathogens, and should not be used as the sole basis for treatment or other patient management decisions. Negative results must be combined with clinical observations, patient history, and epidemiological information. The expected result is Negative.  Fact Sheet for Patients: SugarRoll.be  Fact Sheet for Healthcare Providers: https://www.woods-mathews.com/  This test is not yet approved or cleared by the [place] FDA and  has been authorized for detection and/or diagnosis of SARS-CoV-2 by FDA under an Emergency Use Authorization (EUA). This EUA will remain  in effect (meaning this test can be used) for the duration of the COVID-19 declaration under Se ction 564(b)(1) of the Act, 21 U.S.C. section 360bbb-3(b)(1), unless the authorization is terminated or revoked sooner.  Performed at [facility], [place] [address], [place], [place] [address]    Radiology Studies: No results found.  Scheduled Meds: . atorvastatin  80 mg Oral Daily  . carvedilol  3.125 mg Oral BID WC  . Chlorhexidine Gluconate Cloth  6 each Topical Daily  . clopidogrel  75 mg Oral Q breakfast  . ezetimibe  10 mg Oral Daily  . gabapentin  100 mg Oral Q8H  . insulin aspart  0-15 Units Subcutaneous TID WC  . insulin aspart  0-5 Units Subcutaneous QHS  . insulin aspart  5 Units Subcutaneous TID WC  . insulin glargine  20 Units Subcutaneous QHS  . isosorbide mononitrate  30 mg Oral Daily  . multivitamin with minerals  1 tablet Oral Daily  . Ensure Max Protein  11 oz Oral BID  . rivaroxaban  20 mg Oral Daily  . sodium chloride flush  3 mL Intravenous Q12H  . sodium chloride  1 g Oral BID WC  . spironolactone  25 mg Oral Daily   Continuous Infusions: . sodium chloride    . sodium chloride 0.9 %       LOS: 6 days    Time spent: 25 mins.    Shawna Clamp, MD [facility]   If 7PM-7AM, please contact night-coverage

## 2020-07-19 NOTE — Progress Notes (Signed)
Mobility Specialist - Progress Note   07/19/20 1500  Mobility  Activity Ambulated in room  Level of Assistance Standby assist, set-up cues, supervision of patient - no hands on  Assistive Device Front wheel walker  Distance Ambulated (ft) 30 ft  Mobility Response Tolerated well  Mobility performed by Mobility specialist  $Mobility charge 1 Mobility    Pre-mobility: 70 HR, 97% SpO2 Post-mobility: 80 HR, 97% SpO2   Pt was sitting in recliner upon arrival utilizing room air. Pt agreed to session. Pt reports that he did really good with PT this date, but states that he doesn't think he's ready to be d/c. Pt stated that he had spoken to doctor about his feelings. Pt stood to RW with minA and ambulated 30' in room with no LOB noted. Pt denied dizziness. Upon return to recliner, pt rated his RPE an 8/10. Before exiting, pt asked mobility if she would speak with the doctor again about his concerns. Mobility notified nurse. Overall, pt tolerated session well. Pt was left in recliner with all needs in reach.    Kathee Delton Mobility Specialist 07/19/20, 3:55 PM

## 2020-07-19 NOTE — Progress Notes (Signed)
Inpatient Diabetes Program Recommendations  AACE/ADA: New Consensus Statement on Inpatient Glycemic Control (2015)  Target Ranges:  Prepandial:   less than 140 mg/dL      Peak postprandial:   less than 180 mg/dL (1-2 hours)      Critically ill patients:  140 - 180 mg/dL   Lab Results  Component Value Date   GLUCAP 257 (H) 07/19/2020   HGBA1C 9.5 (H) 07/16/2020    Review of Glycemic Control Results for Bryan Scott, Bryan Scott (MRN [id number]) as of 07/19/2020 14:08  Ref. Range 07/18/2020 07:44 07/18/2020 11:45 07/18/2020 16:22 07/18/2020 21:42 07/19/2020 07:42 07/19/2020 11:49  Glucose-Capillary Latest Ref Range: 70 - 99 mg/dL 238 (H) 246 (H) 267 (H) 190 (H) 201 (H) 257 (H)   Diabetes history:  DM2 Outpatient Diabetes medications:  Jardiance 25 mg + Glucotrol 10 mg bid + Metformin 500 mg bid Current orders for Inpatient glycemic control:  Lantus 15 units QHS Novolog 0-15 units TID  Novolog 0-5 QHS Novolog 3 units TID with meals  Inpatient Diabetes Program Recommendations:    Lantus 20 units QHS Novolog 5 units TID with meals  Secure chat sent to Dr. Dwyane Dee.  Thank you, Nani Gasser. Karesa Maultsby, RN, MSN, CDE  Diabetes Coordinator Inpatient Glycemic Control Team Team Pager [phone] (8am-5pm) 07/19/2020 2:10 PM

## 2020-07-20 DIAGNOSIS — I214 Non-ST elevation (NSTEMI) myocardial infarction: Secondary | ICD-10-CM | POA: Diagnosis not present

## 2020-07-20 LAB — CBC
HCT: 43.8 % (ref 39.0–52.0)
Hemoglobin: 15.6 g/dL (ref 13.0–17.0)
MCH: 32.9 pg (ref 26.0–34.0)
MCHC: 35.6 g/dL (ref 30.0–36.0)
MCV: 92.4 fL (ref 80.0–100.0)
Platelets: 174 10*3/uL (ref 150–400)
RBC: 4.74 MIL/uL (ref 4.22–5.81)
RDW: 11.9 % (ref 11.5–15.5)
WBC: 12.7 10*3/uL — ABNORMAL HIGH (ref 4.0–10.5)
nRBC: 0 % (ref 0.0–0.2)

## 2020-07-20 LAB — BASIC METABOLIC PANEL
Anion gap: 5 (ref 5–15)
BUN: 28 mg/dL — ABNORMAL HIGH (ref 8–23)
CO2: 23 mmol/L (ref 22–32)
Calcium: 9 mg/dL (ref 8.9–10.3)
Chloride: 102 mmol/L (ref 98–111)
Creatinine, Ser: 0.93 mg/dL (ref 0.61–1.24)
GFR, Estimated: >60 mL/min (ref >60)
Glucose, Bld: 154 mg/dL — ABNORMAL HIGH (ref 70–99)
Potassium: 3.7 mmol/L (ref 3.5–5.1)
Sodium: 130 mmol/L — ABNORMAL LOW (ref 135–145)

## 2020-07-20 LAB — GLUCOSE, CAPILLARY
Glucose-Capillary: 151 mg/dL — ABNORMAL HIGH (ref 70–99)
Glucose-Capillary: 312 mg/dL — ABNORMAL HIGH (ref 70–99)
Glucose-Capillary: 89 mg/dL (ref 70–99)
Glucose-Capillary: 326 mg/dL — ABNORMAL HIGH (ref 70–99)

## 2020-07-20 LAB — PHOSPHORUS: Phosphorus: 3.1 mg/dL (ref 2.5–4.6)

## 2020-07-20 LAB — MAGNESIUM: Magnesium: 1.8 mg/dL (ref 1.7–2.4)

## 2020-07-20 MED ORDER — MORPHINE SULFATE (PF) 2 MG/ML IV SOLN
2.0000 mg | Freq: Three times a day (TID) | INTRAVENOUS | Status: DC | PRN
  Administered 2020-07-20: 2 mg via INTRAVENOUS
  Filled 2020-07-20: qty 1

## 2020-07-20 NOTE — Progress Notes (Addendum)
PROGRESS NOTE    Bryan Scott  SHF:[id number] DOB: 03-22-1946 DOA: 07/11/2020 PCP: [name], MD    Brief Narrative:  This 75 years old male with PMH significant for CAD s/p CABG, hypertension, paroxysmal A. fib on anticoagulation, type 2 diabetes, hyperlipidemia and history of bladder cancer admitted with persistent chest pain and some blurred vision.  Patient is admitted for non-STEMI, He underwent left heart cath with PCI.  Patient is started on Plavix and Xarelto post cath.  Chest pain has resolved but patient complains of having difficulty swallowing, sore throat and pain.  ENT consulted. No acute intervention needed at this time.  Follow-up outpatient ENT and GI for dysphagia. PT recommended skilled nursing facility.  Patient has participated in physical therapy and PT has revised the recommendation.  Home with home PT.  Assessment & Plan:   Principal Problem:   NSTEMI (non-ST elevated myocardial infarction) ([place]) Active Problems:   Type 2 diabetes mellitus without complication (HCC)   Hyperlipidemia   Essential hypertension   PAF (paroxysmal atrial fibrillation) (HCC)   Hyponatremia   CAD (coronary artery disease)   Leukocytosis   AKI (acute kidney injury) ([place])   Chest pain sec. to NSTEMI / Coronary artery disease: Patient with known history of CAD status post CABG He follows up with cardiology at the [facility]. EKG does not show any acute findings Initial troponin of 67 -->107->89.  Nuclear stress test back in June 2021 was low risk. Cardiology consulted, patient underwent left heart cath with multivessel CAD.  Successful PCI to SVG. Transitioned from IV heparin to Xarelto.  Patient started on Plavix, continue for at least 12 months.   No aspirin in the setting of allergies.  Continue chronic anticoagulation with Xarelto. Patient is cleared from cardiology to be discharged.  Blurry vision/tinnitus >> Resolved. No focal Neurological findings - Low suspicion of  CVA. Patient had CT head back in March 2021 for dizziness and had paranasal sinus disease which could explain his current symptoms.  MRI brain negative for CVA. monitor symptoms - outpt f/up with ENT/Ophthal  Hyponatremia:  Suspect due to hypovolemia. Na 132 ->134->129> 133 > 130 Continue sodium tablet 1 gm TID  AKI -present on admission and now resolved. Likely prerenal given hyponatremia and hemoconcentrated hemoglobin and hematocrit Discontinue IV 75 cc NS infusion.  Elevated anion gap -POA and resolved.  Suspect from hypovolemia. follow with repeat BMP after fluids.   Leukocytosis Unclear etiology. No signs of infection Follow repeat CBC in the morning after fluids.  Essential hypertension -BP minimally elevated.  Diabetes mellitus, type II -Last HbA1C of 8 in 04/2020. Continue regular Insulin sliding scale.  History of paroxysmal atrial fibrillation -Hold Xareto while on heparin gtt. Last dose morning of 1/26.   Xarelto resumed post cath as per cardiology  Hyperlipidemia -Continue statin  Pancreatic mass F/up at [facility] - Work up in progress   DVT prophylaxis: Xarelto Code Status: Full code Family Communication: No family at bedside Disposition Plan:   Status is: Inpatient  Remains inpatient appropriate because:Inpatient level of care appropriate due to severity of illness   Dispo: The patient is from: Home              Anticipated d/c is to:   [facility] services              Anticipated d/c date is: 1 days ( Saturday)              Patient currently is medically stable for discharge.  Difficult to place patient No   Consultants:   Cardiology  Procedures: Left heart catheterization. Antimicrobials:   Anti-infectives (From admission, onward)   None      Subjective: Patient was seen and examined at bedside.  Overnight events noted.  Patient reports feeling better,    He reports feeling much improved, denies any chest pain, sob, dizziness.    Patient reports he has no place to go home,  his family is planning to arrange electricity for his home.   He wants to be discharged Saturday.  Objective: Vitals:   07/19/20 2008 07/20/20 0524 07/20/20 0726 07/20/20 1116  BP: 136/79 135/87 135/73 118/65  Pulse: 74 66 67 72  Resp:  19 20 20   Temp: 97.8 F (36.6 C) 97.7 F (36.5 C) 97.7 F (36.5 C) 97.9 F (36.6 C)  TempSrc: Oral  Oral Oral  SpO2: 97% 98% 98% 96%  Weight:  80.7 kg    Height:        Intake/Output Summary (Last 24 hours) at 07/20/2020 1154 Last data filed at 07/19/2020 1815 Gross per 24 hour  Intake 720 ml  Output 300 ml  Net 420 ml   Filed Weights   07/18/20 0346 07/19/20 0332 07/20/20 0524  Weight: 81 kg 79.7 kg 80.7 kg    Examination:  General exam: Appears calm and comfortable, not in any distress. Respiratory system: Clear to auscultation. Respiratory effort normal. Cardiovascular system: S1 & S2 heard, RRR. No JVD, murmurs, rubs, gallops or clicks. No pedal edema. Gastrointestinal system: Abdomen is nondistended, soft and nontender. No organomegaly or masses felt. Normal bowel sounds heard. Central nervous system: Alert and oriented. No focal neurological deficits. Extremities: Symmetric 5 x 5 power. Skin: No rashes, lesions or ulcers Psychiatry: Judgement and insight appear normal. Mood & affect appropriate.     Data Reviewed: I have personally reviewed following labs and imaging studies  CBC: Recent Labs  Lab 07/14/20 0317 07/15/20 0444 07/16/20 0440 07/18/20 0540 07/19/20 0531 07/20/20 0508  WBC 13.5* 12.9* 13.0*  --  13.2* 12.7*  HGB 18.2* 17.3* 17.0 17.0 16.5 15.6  HCT 52.1* 48.1 48.0 48.1 46.4 43.8  MCV 93.4 92.0 91.6  --  93.2 92.4  PLT 183 195 189  --  182 [id number]   Basic Metabolic Panel: Recent Labs  Lab 07/16/20 0440 07/17/20 0648 07/18/20 0540 07/19/20 0531 07/20/20 0508  NA 129* 133* 133* 133* 130*  K 3.9 4.0 3.9 4.0 3.7  CL 96* 101 100 101 102  CO2 20* 22 21* 24 23   GLUCOSE 326* 245* 205* 198* 154*  BUN 43* 32* 27* 28* 28*  CREATININE 1.29* 1.12 0.97 1.06 0.93  CALCIUM 9.8 9.6 9.3 9.3 9.0  MG 2.0 2.0 1.8 1.8 1.8  PHOS 2.7 3.1 2.7 2.7 3.1   GFR: Estimated Creatinine Clearance: 76.5 mL/min (by C-G formula based on SCr of 0.93 mg/dL). Liver Function Tests: Recent Labs  Lab 07/15/20 0444  AST 21  ALT 29  ALKPHOS 70  BILITOT 2.2*  PROT 7.6  ALBUMIN 3.6   No results for input(s): LIPASE, AMYLASE in the last 168 hours. No results for input(s): AMMONIA in the last 168 hours. Coagulation Profile: No results for input(s): INR, PROTIME in the last 168 hours. Cardiac Enzymes: No results for input(s): CKTOTAL, CKMB, CKMBINDEX, TROPONINI in the last 168 hours. BNP (last 3 results) No results for input(s): PROBNP in the last 8760 hours. HbA1C: No results for input(s): HGBA1C in the last 72 hours. CBG: Recent  Labs  Lab 07/19/20 1149 07/19/20 1616 07/19/20 2008 07/20/20 0834 07/20/20 1142  GLUCAP 257* 165* 246* 151* 312*   Lipid Profile: No results for input(s): CHOL, HDL, LDLCALC, TRIG, CHOLHDL, LDLDIRECT in the last 72 hours. Thyroid Function Tests: No results for input(s): TSH, T4TOTAL, FREET4, T3FREE, THYROIDAB in the last 72 hours. Anemia Panel: No results for input(s): VITAMINB12, FOLATE, FERRITIN, TIBC, IRON, RETICCTPCT in the last 72 hours. Sepsis Labs: No results for input(s): PROCALCITON, LATICACIDVEN in the last 168 hours.  Recent Results (from the past 240 hour(s))  SARS CORONAVIRUS 2 (TAT 6-24 HRS) Nasopharyngeal Nasopharyngeal Swab     Status: None   Collection Time: 07/11/20  6:05 PM   Specimen: Nasopharyngeal Swab  Result Value Ref Range Status   SARS Coronavirus 2 NEGATIVE NEGATIVE Final    Comment: (NOTE) SARS-CoV-2 target nucleic acids are NOT DETECTED.  The SARS-CoV-2 RNA is generally detectable in upper and lower respiratory specimens during the acute phase of infection. Negative results do not preclude  SARS-CoV-2 infection, do not rule out co-infections with other pathogens, and should not be used as the sole basis for treatment or other patient management decisions. Negative results must be combined with clinical observations, patient history, and epidemiological information. The expected result is Negative.  Fact Sheet for Patients: SugarRoll.be  Fact Sheet for Healthcare Providers: https://www.woods-mathews.com/  This test is not yet approved or cleared by the [place] FDA and  has been authorized for detection and/or diagnosis of SARS-CoV-2 by FDA under an Emergency Use Authorization (EUA). This EUA will remain  in effect (meaning this test can be used) for the duration of the COVID-19 declaration under Se ction 564(b)(1) of the Act, 21 U.S.C. section 360bbb-3(b)(1), unless the authorization is terminated or revoked sooner.  Performed at [facility], [place] [address], [place], [place] [address]    Radiology Studies: No results found.  Scheduled Meds: . atorvastatin  80 mg Oral Daily  . carvedilol  3.125 mg Oral BID WC  . Chlorhexidine Gluconate Cloth  6 each Topical Daily  . clopidogrel  75 mg Oral Q breakfast  . ezetimibe  10 mg Oral Daily  . gabapentin  100 mg Oral Q8H  . insulin aspart  0-15 Units Subcutaneous TID WC  . insulin aspart  0-5 Units Subcutaneous QHS  . insulin aspart  5 Units Subcutaneous TID WC  . insulin glargine  20 Units Subcutaneous QHS  . isosorbide mononitrate  30 mg Oral Daily  . multivitamin with minerals  1 tablet Oral Daily  . Ensure Max Protein  11 oz Oral BID  . rivaroxaban  20 mg Oral Daily  . sodium chloride flush  3 mL Intravenous Q12H  . sodium chloride  1 g Oral BID WC  . spironolactone  25 mg Oral Daily   Continuous Infusions: . sodium chloride       LOS: 7 days    Time spent: 25 mins.    Shawna Clamp, MD [facility]   If 7PM-7AM, please contact  night-coverage

## 2020-07-20 NOTE — Progress Notes (Signed)
Occupational Therapy Treatment Patient Details Name: Bryan Scott MRN: [id number] DOB: 10-Nov-1945 Today's Date: 07/20/2020    History of present illness Patient is a 75 year old male with PMH significant for CAD s/p CABG, hypertension, paroxysmal A. fib on anticoagulation, type 2 diabetes, hyperlipidemia and history of bladder cancer admitted with persistent chest pain and some blurred vision. Patient is admitted for non-STEMI, He underwent left heart cath with PCI.   OT comments  Pt seen for OT treatment this date. Pt not agreeable to getting out of recliner, but demos ability to perform repositioning and rolling with no assistance. OT opts to spend session on education including importance of OOB Activity and energy conservation. Pt with good understanding. Will continue to follow acutely. Pt has progressed with his general mobility and activity tolerance and therefore, d/c recommendation updated to home with HHOT to ensure safety with ADLs in the natural environment.    Follow Up Recommendations  Home health OT    Equipment Recommendations  3 in 1 bedside commode;Other (comment) (2ww)    Recommendations for Other Services      Precautions / Restrictions Precautions Precautions: Fall Restrictions Weight Bearing Restrictions: No       Mobility Bed Mobility               General bed mobility comments: pt in recliner pre/post session. Able to I'ly reposition including raising LEs and leaning trunk support back, turning/rolling to any sides.  Transfers                 General transfer comment: deferred, pt requests to continue sitting in chair    Balance                                           ADL either performed or assessed with clinical judgement   ADL                                               Vision Baseline Vision/History: Wears glasses Wears Glasses: Reading only     Perception     Praxis      Cognition  Arousal/Alertness: Awake/alert Behavior During Therapy: WFL for tasks assessed/performed Overall Cognitive Status: Within Functional Limits for tasks assessed                                          Exercises Other Exercises Other Exercises: OT educates pt re: importance of OOB activity for back/lungs and general prevention of deconditioning. In addition, OT educates re: energy conservation. Pt with good understanding.   Shoulder Instructions       General Comments      Pertinent Vitals/ Pain       Pain Assessment: No/denies pain  Home Living                                          Prior Functioning/Environment              Frequency  Min 1X/week        Progress Toward  Goals  OT Goals(current goals can now be found in the care plan section)  Progress towards OT goals: Progressing toward goals  Acute Rehab OT Goals Patient Stated Goal: to get home OT Goal Formulation: With patient Time For Goal Achievement: 07/29/20 Potential to Achieve Goals: [place] Frequency remains appropriate;Discharge plan needs to be updated    Co-evaluation                 AM-PAC OT "6 Clicks" Daily Activity     Outcome Measure   Help from another person eating meals?: None Help from another person taking care of personal grooming?: None Help from another person toileting, which includes using toliet, bedpan, or urinal?: A Little Help from another person bathing (including washing, rinsing, drying)?: A Little Help from another person to put on and taking off regular upper body clothing?: None Help from another person to put on and taking off regular lower body clothing?: A Little 6 Click Score: 21    End of Session    OT Visit Diagnosis: Unsteadiness on feet (R26.81);History of falling (Z91.81);Muscle weakness (generalized) (M62.81)   Activity Tolerance Patient tolerated treatment well   Patient Left in chair;with call bell/phone  within reach   Nurse Communication          Time: 1252-1310 OT Time Calculation (min): 18 min  Charges: OT General Charges $OT Visit: 1 Visit OT Treatments $Self Care/Home Management : 8-22 mins  Gerrianne Scale, Amanda, OTR/L ascom [phone] 07/20/20, 3:23 PM

## 2020-07-21 DIAGNOSIS — I214 Non-ST elevation (NSTEMI) myocardial infarction: Secondary | ICD-10-CM | POA: Diagnosis not present

## 2020-07-21 LAB — GLUCOSE, CAPILLARY
Glucose-Capillary: 186 mg/dL — ABNORMAL HIGH (ref 70–99)
Glucose-Capillary: 176 mg/dL — ABNORMAL HIGH (ref 70–99)

## 2020-07-21 NOTE — Plan of Care (Signed)
  Problem: Education: Goal: Knowledge of General Education information will improve Description: Including pain rating scale, medication(s)/side effects and non-pharmacologic comfort measures 07/21/2020 1127 by Alen Blew, RN Outcome: Adequate for Discharge 07/21/2020 1127 by Alen Blew, RN Outcome: Adequate for Discharge   Problem: Health Behavior/Discharge Planning: Goal: Ability to manage health-related needs will improve 07/21/2020 1127 by Alen Blew, RN Outcome: Adequate for Discharge 07/21/2020 1127 by Alen Blew, RN Outcome: Adequate for Discharge   Problem: Clinical Measurements: Goal: Ability to maintain clinical measurements within normal limits will improve 07/21/2020 1127 by Alen Blew, RN Outcome: Adequate for Discharge 07/21/2020 1127 by Alen Blew, RN Outcome: Adequate for Discharge Goal: Will remain free from infection 07/21/2020 1127 by Alen Blew, RN Outcome: Adequate for Discharge 07/21/2020 1127 by Alen Blew, RN Outcome: Adequate for Discharge Goal: Diagnostic test results will improve 07/21/2020 1127 by Alen Blew, RN Outcome: Adequate for Discharge 07/21/2020 1127 by Alen Blew, RN Outcome: Adequate for Discharge Goal: Respiratory complications will improve 07/21/2020 1127 by Alen Blew, RN Outcome: Adequate for Discharge 07/21/2020 1127 by Alen Blew, RN Outcome: Adequate for Discharge Goal: Cardiovascular complication will be avoided 07/21/2020 1127 by Alen Blew, RN Outcome: Adequate for Discharge 07/21/2020 1127 by Alen Blew, RN Outcome: Adequate for Discharge   Problem: Coping: Goal: Level of anxiety will decrease 07/21/2020 1127 by Alen Blew, RN Outcome: Adequate for Discharge 07/21/2020 1127 by Alen Blew, RN Outcome: Adequate for Discharge   Problem: Nutrition: Goal: Adequate nutrition will be maintained 07/21/2020 1127 by Alen Blew, RN Outcome: Adequate for  Discharge 07/21/2020 1127 by Alen Blew, RN Outcome: Adequate for Discharge   Problem: Pain Managment: Goal: General experience of comfort will improve 07/21/2020 1127 by Alen Blew, RN Outcome: Adequate for Discharge 07/21/2020 1127 by Alen Blew, RN Outcome: Adequate for Discharge   Problem: Elimination: Goal: Will not experience complications related to bowel motility 07/21/2020 1127 by Alen Blew, RN Outcome: Adequate for Discharge 07/21/2020 1127 by Alen Blew, RN Outcome: Adequate for Discharge Goal: Will not experience complications related to urinary retention 07/21/2020 1127 by Alen Blew, RN Outcome: Adequate for Discharge 07/21/2020 1127 by Alen Blew, RN Outcome: Adequate for Discharge   Problem: Skin Integrity: Goal: Risk for impaired skin integrity will decrease 07/21/2020 1127 by Alen Blew, RN Outcome: Adequate for Discharge 07/21/2020 1127 by Alen Blew, RN Outcome: Adequate for Discharge   Problem: Increased Nutrient Needs (NI-5.1) Goal: Food and/or nutrient delivery Description: Individualized approach for food/nutrient provision. Outcome: Adequate for Discharge   Problem: Inadequate Intake (NI-2.1) Goal: Food and/or nutrient delivery Description: Individualized approach for food/nutrient provision. Outcome: Adequate for Discharge

## 2020-07-30 ENCOUNTER — Telehealth: Payer: Self-pay

## 2020-07-30 DIAGNOSIS — E1142 Type 2 diabetes mellitus with diabetic polyneuropathy: Secondary | ICD-10-CM

## 2020-07-30 NOTE — Telephone Encounter (Signed)
Referral and office note has been faxed to [facility] clinic Neurology

## 2020-07-30 NOTE — Telephone Encounter (Signed)
-----   Message from Criselda Peaches, DPM sent at 07/12/2020  1:11 PM EST ----- Levada Dy, can you send him a referral to [facility] clinic neurology?  Diagnosis is diabetic peripheral neuropathy.  Thank you!

## 2020-08-07 ENCOUNTER — Emergency Department: Payer: Medicare PPO

## 2020-08-07 ENCOUNTER — Other Ambulatory Visit: Payer: Self-pay

## 2020-08-07 ENCOUNTER — Observation Stay
Admission: EM | Admit: 2020-08-07 | Discharge: 2020-08-09 | Disposition: A | Discharge status: Home / Self Care | Payer: Medicare PPO | Attending: Internal Medicine | Admitting: Internal Medicine

## 2020-08-07 DIAGNOSIS — R55 Syncope and collapse: Secondary | ICD-10-CM | POA: Diagnosis present

## 2020-08-07 DIAGNOSIS — I152 Hypertension secondary to endocrine disorders: Secondary | ICD-10-CM | POA: Diagnosis present

## 2020-08-07 DIAGNOSIS — I10 Essential (primary) hypertension: Secondary | ICD-10-CM | POA: Diagnosis present

## 2020-08-07 DIAGNOSIS — M5412 Radiculopathy, cervical region: Secondary | ICD-10-CM | POA: Diagnosis not present

## 2020-08-07 DIAGNOSIS — E1159 Type 2 diabetes mellitus with other circulatory complications: Secondary | ICD-10-CM | POA: Diagnosis present

## 2020-08-07 DIAGNOSIS — I2510 Atherosclerotic heart disease of native coronary artery without angina pectoris: Secondary | ICD-10-CM | POA: Diagnosis not present

## 2020-08-07 DIAGNOSIS — Z79899 Other long term (current) drug therapy: Secondary | ICD-10-CM | POA: Insufficient documentation

## 2020-08-07 DIAGNOSIS — Z20822 Contact with and (suspected) exposure to covid-19: Secondary | ICD-10-CM | POA: Diagnosis not present

## 2020-08-07 DIAGNOSIS — Z7984 Long term (current) use of oral hypoglycemic drugs: Secondary | ICD-10-CM | POA: Diagnosis not present

## 2020-08-07 DIAGNOSIS — Z8551 Personal history of malignant neoplasm of bladder: Secondary | ICD-10-CM | POA: Diagnosis not present

## 2020-08-07 DIAGNOSIS — R0602 Shortness of breath: Secondary | ICD-10-CM | POA: Diagnosis not present

## 2020-08-07 DIAGNOSIS — E119 Type 2 diabetes mellitus without complications: Secondary | ICD-10-CM | POA: Insufficient documentation

## 2020-08-07 DIAGNOSIS — I480 Paroxysmal atrial fibrillation: Secondary | ICD-10-CM | POA: Diagnosis not present

## 2020-08-07 DIAGNOSIS — K859 Acute pancreatitis without necrosis or infection, unspecified: Principal | ICD-10-CM | POA: Insufficient documentation

## 2020-08-07 DIAGNOSIS — Z951 Presence of aortocoronary bypass graft: Secondary | ICD-10-CM | POA: Insufficient documentation

## 2020-08-07 DIAGNOSIS — E785 Hyperlipidemia, unspecified: Secondary | ICD-10-CM | POA: Diagnosis present

## 2020-08-07 DIAGNOSIS — Z87891 Personal history of nicotine dependence: Secondary | ICD-10-CM | POA: Insufficient documentation

## 2020-08-07 DIAGNOSIS — E871 Hypo-osmolality and hyponatremia: Secondary | ICD-10-CM | POA: Diagnosis present

## 2020-08-07 DIAGNOSIS — Z7901 Long term (current) use of anticoagulants: Secondary | ICD-10-CM | POA: Diagnosis not present

## 2020-08-07 DIAGNOSIS — E1169 Type 2 diabetes mellitus with other specified complication: Secondary | ICD-10-CM | POA: Diagnosis present

## 2020-08-07 DIAGNOSIS — E1151 Type 2 diabetes mellitus with diabetic peripheral angiopathy without gangrene: Secondary | ICD-10-CM

## 2020-08-07 DIAGNOSIS — N179 Acute kidney failure, unspecified: Secondary | ICD-10-CM | POA: Diagnosis present

## 2020-08-07 LAB — URINALYSIS, COMPLETE (UACMP) WITH MICROSCOPIC
Bilirubin Urine: NEGATIVE
Glucose, UA: >=500 mg/dL — AB
Hgb urine dipstick: NEGATIVE
Ketones, ur: 20 mg/dL — AB
Leukocytes,Ua: NEGATIVE
Nitrite: NEGATIVE
Protein, ur: NEGATIVE mg/dL
Specific Gravity, Urine: 1.016 (ref 1.005–1.030)
Squamous Epithelial / HPF: NONE SEEN (ref 0–5)
WBC, UA: NONE SEEN WBC/hpf (ref 0–5)
pH: 6 (ref 5.0–8.0)

## 2020-08-07 LAB — LIPASE, BLOOD: Lipase: 116 U/L — ABNORMAL HIGH (ref 11–51)

## 2020-08-07 LAB — RESP PANEL BY RT-PCR (FLU A&B, COVID) ARPGX2
Influenza A by PCR: NEGATIVE
Influenza B by PCR: NEGATIVE
SARS Coronavirus 2 by RT PCR: NEGATIVE

## 2020-08-07 LAB — CBC WITH DIFFERENTIAL/PLATELET
Abs Immature Granulocytes: 0.07 10*3/uL (ref 0.00–0.07)
Basophils Absolute: 0 10*3/uL (ref 0.0–0.1)
Basophils Relative: 0 %
Eosinophils Absolute: 0.1 10*3/uL (ref 0.0–0.5)
Eosinophils Relative: 1 %
HCT: 45.8 % (ref 39.0–52.0)
Hemoglobin: 16.4 g/dL (ref 13.0–17.0)
Immature Granulocytes: 1 %
Lymphocytes Relative: 18 %
Lymphs Abs: 1.9 10*3/uL (ref 0.7–4.0)
MCH: 32.7 pg (ref 26.0–34.0)
MCHC: 35.8 g/dL (ref 30.0–36.0)
MCV: 91.4 fL (ref 80.0–100.0)
Monocytes Absolute: 0.8 10*3/uL (ref 0.1–1.0)
Monocytes Relative: 8 %
Neutro Abs: 7.9 10*3/uL — ABNORMAL HIGH (ref 1.7–7.7)
Neutrophils Relative %: 72 %
Platelets: 207 10*3/uL (ref 150–400)
RBC: 5.01 MIL/uL (ref 4.22–5.81)
RDW: 12.3 % (ref 11.5–15.5)
WBC: 10.9 10*3/uL — ABNORMAL HIGH (ref 4.0–10.5)
nRBC: 0 % (ref 0.0–0.2)

## 2020-08-07 LAB — COMPREHENSIVE METABOLIC PANEL
ALT: 24 U/L (ref 0–44)
AST: 36 U/L (ref 15–41)
Albumin: 3.9 g/dL (ref 3.5–5.0)
Alkaline Phosphatase: 64 U/L (ref 38–126)
Anion gap: 13 (ref 5–15)
BUN: 30 mg/dL — ABNORMAL HIGH (ref 8–23)
CO2: 20 mmol/L — ABNORMAL LOW (ref 22–32)
Calcium: 9.6 mg/dL (ref 8.9–10.3)
Chloride: 96 mmol/L — ABNORMAL LOW (ref 98–111)
Creatinine, Ser: 1.29 mg/dL — ABNORMAL HIGH (ref 0.61–1.24)
GFR, Estimated: 58 mL/min — ABNORMAL LOW (ref >60)
Glucose, Bld: 128 mg/dL — ABNORMAL HIGH (ref 70–99)
Potassium: 3.3 mmol/L — ABNORMAL LOW (ref 3.5–5.1)
Sodium: 129 mmol/L — ABNORMAL LOW (ref 135–145)
Total Bilirubin: 2.4 mg/dL — ABNORMAL HIGH (ref 0.3–1.2)
Total Protein: 8.1 g/dL (ref 6.5–8.1)

## 2020-08-07 LAB — TROPONIN I (HIGH SENSITIVITY)
Troponin I (High Sensitivity): 9 ng/L (ref <18)
Troponin I (High Sensitivity): 9 ng/L (ref <18)

## 2020-08-07 LAB — BRAIN NATRIURETIC PEPTIDE: B Natriuretic Peptide: 36.4 pg/mL (ref 0.0–100.0)

## 2020-08-07 IMAGING — MR MR CERVICAL SPINE WO/W CM
7 of 10 series · 27 of 48 positions shown · IV contrast (7ml Gadavist)
Comparison: None.

CLINICAL DATA: Syncope with neck pain

EXAM:
MRI CERVICAL SPINE WITHOUT AND WITH CONTRAST
TECHNIQUE: Multiplanar and multiecho pulse sequences of the cervical spine, to
include the craniocervical junction and cervicothoracic junction,
were obtained without and with intravenous contrast.
CONTRAST:  6mL GADAVIST GADOBUTROL 1 MMOL/ML IV SOLN

[Series 5: T2 · sagittal · 3.0mm · 0.62mm/px · 2 of 17 slices shown (1 of 2)]
[im 1/17]
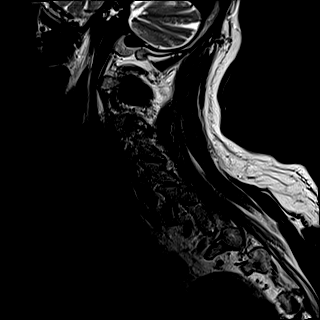
[im 17/17]
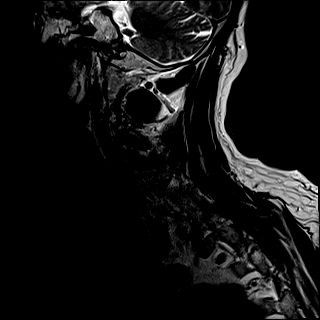

[Series 6: FLAIR · sagittal · 3.0mm · 0.78mm/px · 3 of 17 slices shown]
[im 1/17]
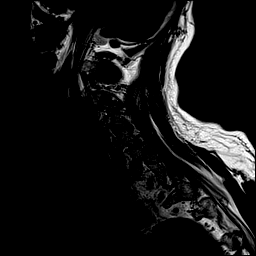
[im 9/17]
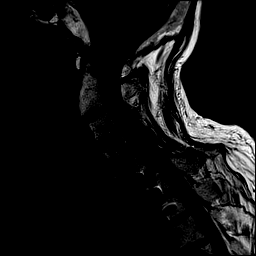
[im 17/17]
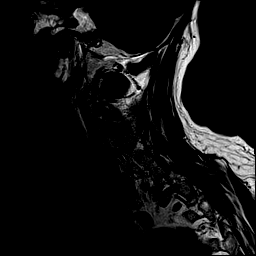

[Series 7: STIR · sagittal · 3.0mm · 0.62mm/px · 3 of 17 slices shown]
[im 1/17]
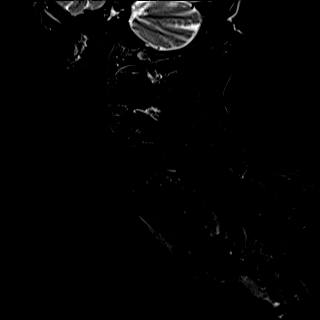
[im 9/17]
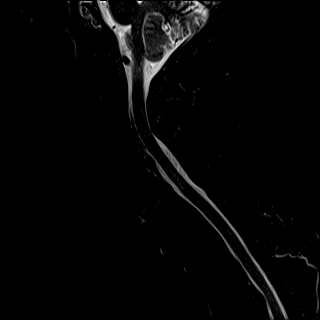
[im 17/17]
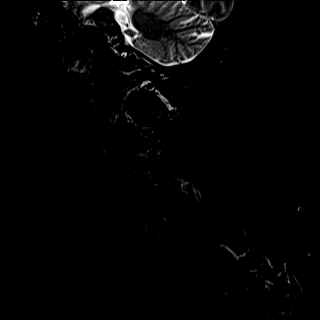

[Series 8: T2 · axial · 3.0mm · 0.70mm/px · z∈[-148,-44]mm · 5 of 35 slices shown (2 of 2)]
[im 1/35]
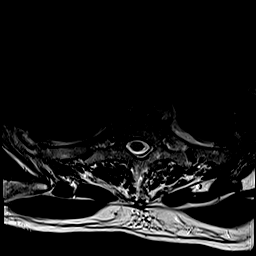
[im 9/35]
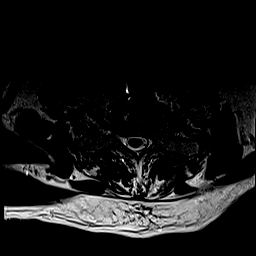
[im 18/35]
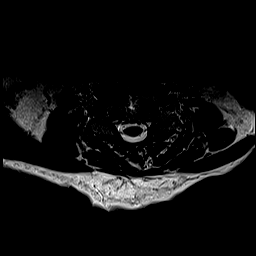
[im 26/35]
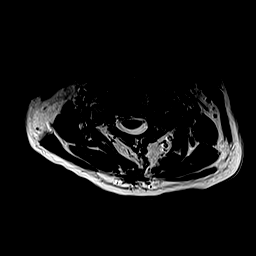
[im 35/35]
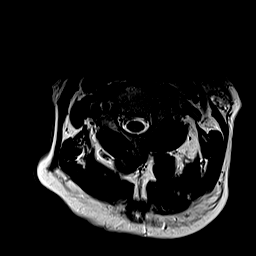

[Series 9: ax mpgr · axial · 3.0mm · 0.35mm/px · z∈[-148,-71]mm · 4 of 35 slices shown]
[im 1/35]
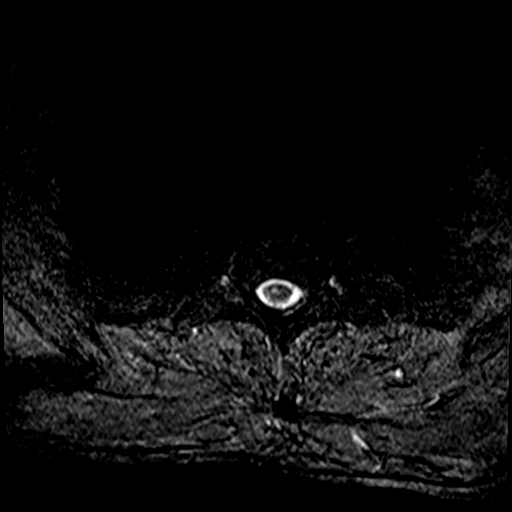
[im 9/35]
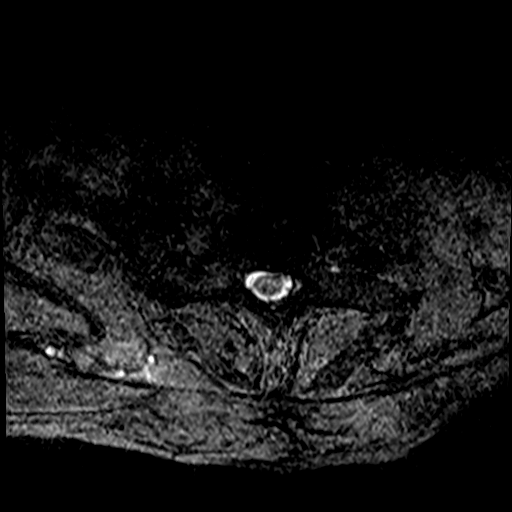
[im 18/35]
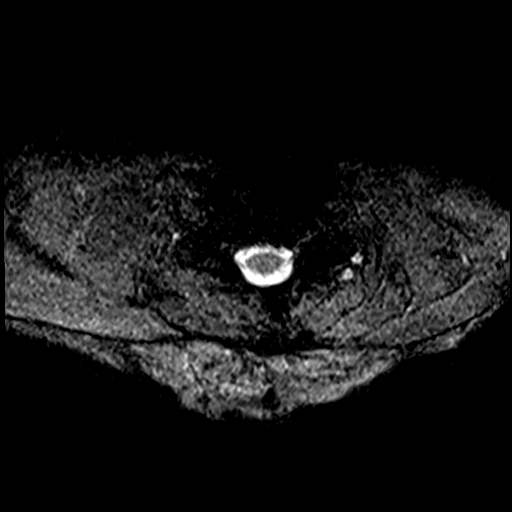
[im 26/35]
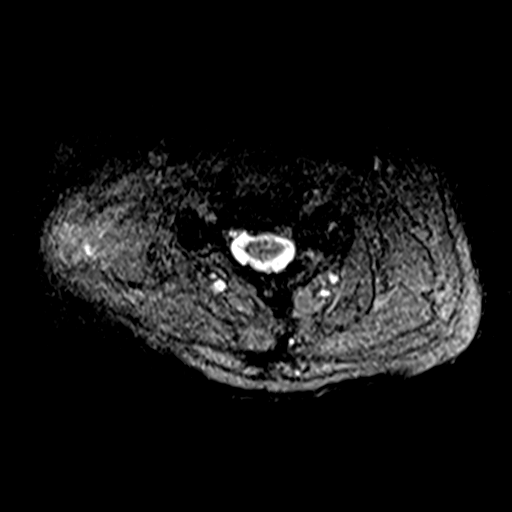

[Series 10: T1 · axial · non-contrast · 3.0mm · 0.35mm/px · z∈[-148,-44]mm · 5 of 35 slices shown]
[im 1/35]
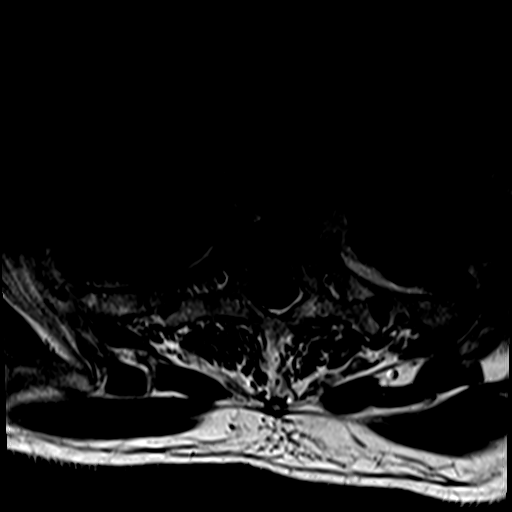
[im 9/35]
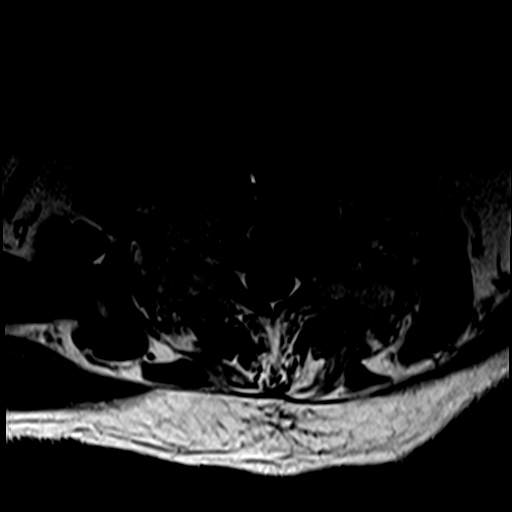
[im 18/35]
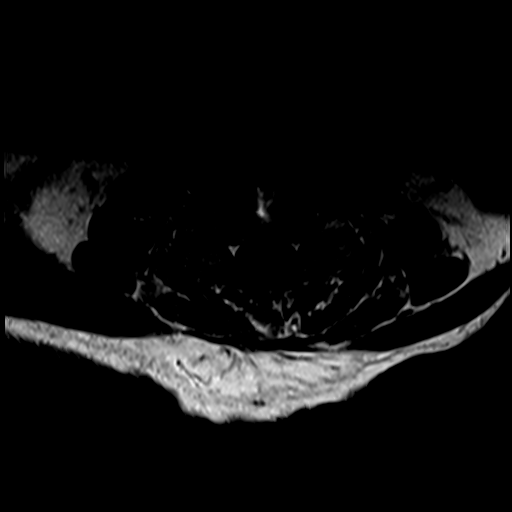
[im 26/35]
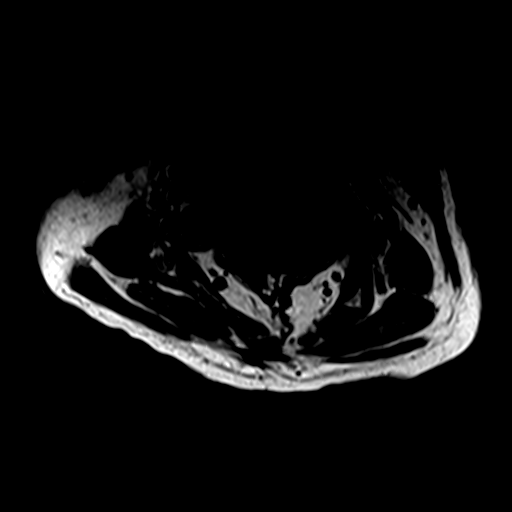
[im 35/35]
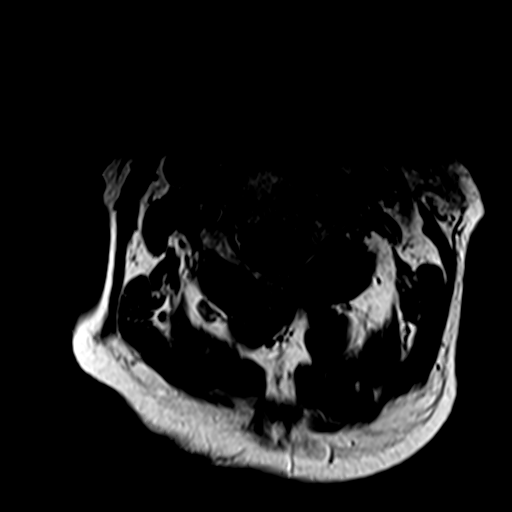

[Series 14: T1 post-contrast · axial · 3.0mm · 0.35mm/px · z∈[-148,-44]mm · 5 of 35 slices shown]
[im 1/35]
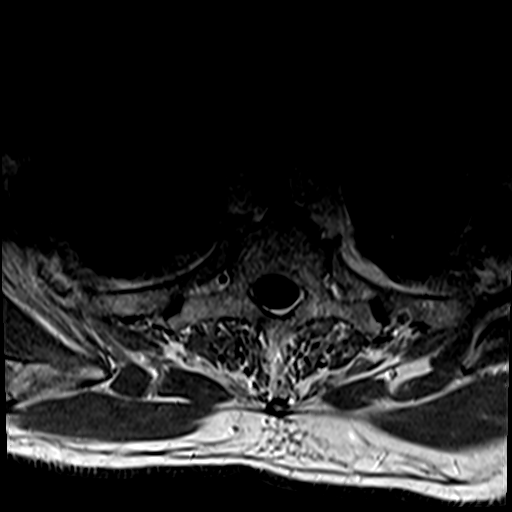
[im 9/35]
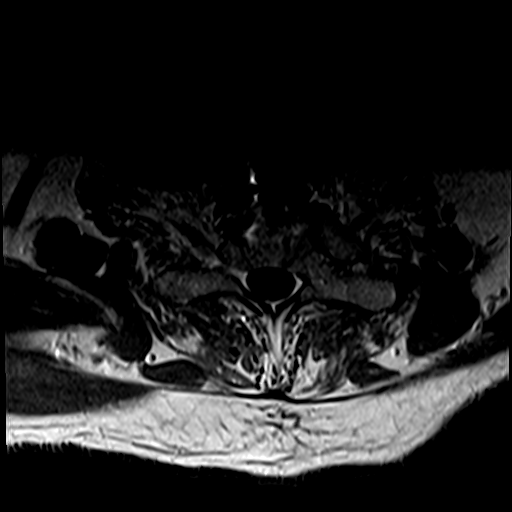
[im 18/35]
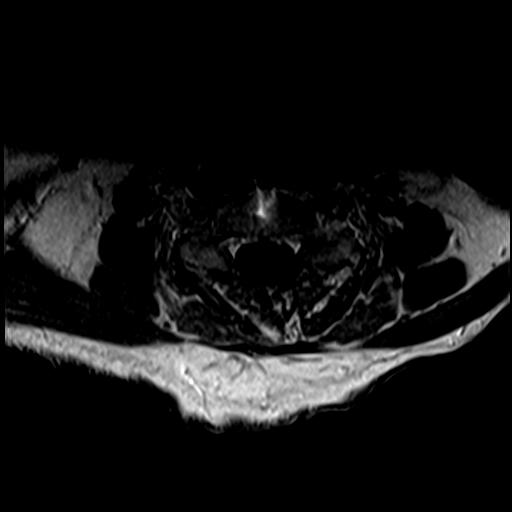
[im 26/35]
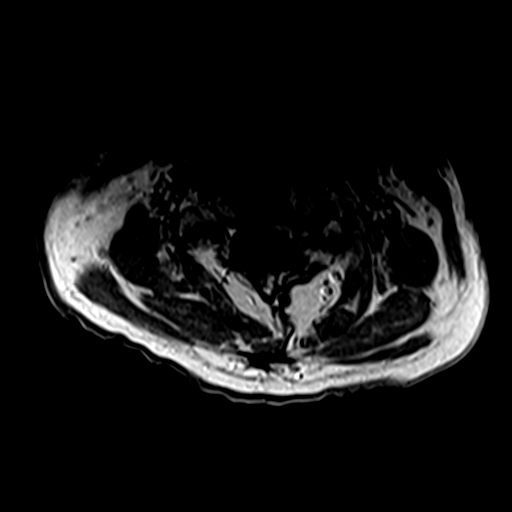
[im 35/35]
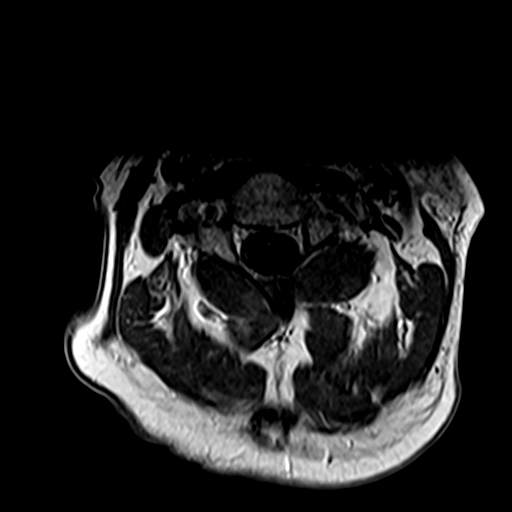

[27 of 48 positions shown; findings below may reference images not displayed]

FINDINGS: Alignment: Grade 1 anterolisthesis at C3-4

Vertebrae: C5-7 ACDF.  No acute fracture.

Cord: Symmetric bilateral hyperintense T2-weighted signal within the
spinal cord at the C4 level. Otherwise normal signal.

Posterior Fossa, vertebral arteries, paraspinal tissues: Negative.

Disc levels:

C1-2: Negative

C2-3: Left-greater-than-right facet and uncovertebral hypertrophy.
Mild spinal canal stenosis. Severe left neural foraminal stenosis.

C3-4: Small central disc extrusion with components of superior and
inferior migration. Mild facet hypertrophy. Mild spinal canal
stenosis. Severe bilateral neural foraminal stenosis.

C4-5: Small disc bulge with bilateral uncovertebral hypertrophy.
There is no spinal canal stenosis. Severe bilateral neural foraminal
stenosis.

C5-6: Anterior fusion. There is no spinal canal stenosis. Mild
bilateral neural foraminal stenosis.

C6-7: Anterior fusion. There is no spinal canal stenosis. Mild left
neural foraminal stenosis.

C7-T1: Normal disc space and facet joints. There is no spinal canal
stenosis. No neural foraminal stenosis.
IMPRESSION: 1. C5-7 ACDF without spinal canal stenosis.
2. Severe bilateral neural foraminal stenosis at C3-4 and C4-5.
3. Mild spinal canal stenosis at C2-3 and C3-4.
4. Symmetric bilateral hyperintense T2-weighted signal within the
spinal cord at the C4 level, consistent with myelomalacia.

## 2020-08-07 IMAGING — CT CT CERVICAL SPINE W/O CM
3 of 4 series · 13 of 35 positions shown, 16 images · non-contrast
Comparison: MRI brain [DATE], head CT [DATE] CT
neck [DATE] and [DATE].

CLINICAL DATA: syncopal episode at home today. Pt has neck pain
headache, back pain. Stent placement 2 weeks ago. Pt on plavix

EXAM:
CT HEAD WITHOUT CONTRAST
CT CERVICAL SPINE WITHOUT CONTRAST
TECHNIQUE: Multidetector CT imaging of the head and cervical spine was
performed following the standard protocol without intravenous
contrast. Multiplanar CT image reconstructions of the cervical spine
were also generated.

[Series 4: sagittal bone · sagittal · 0.30mm/px · 5 of 60 slices shown, 6 images]
[im 20/60  bone]
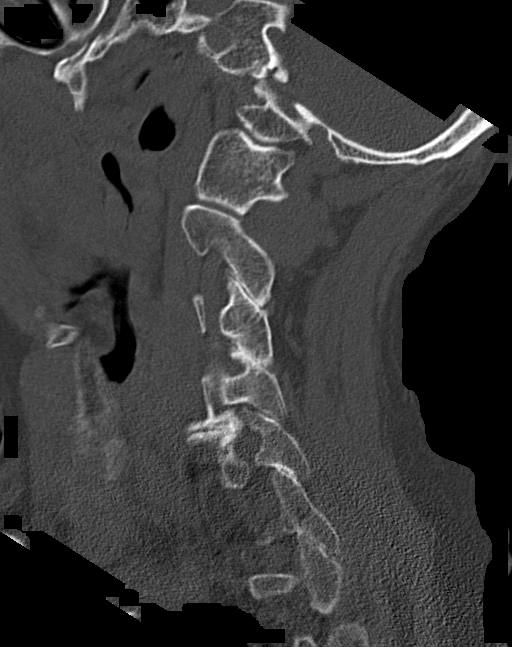
[im 25/60  bone]
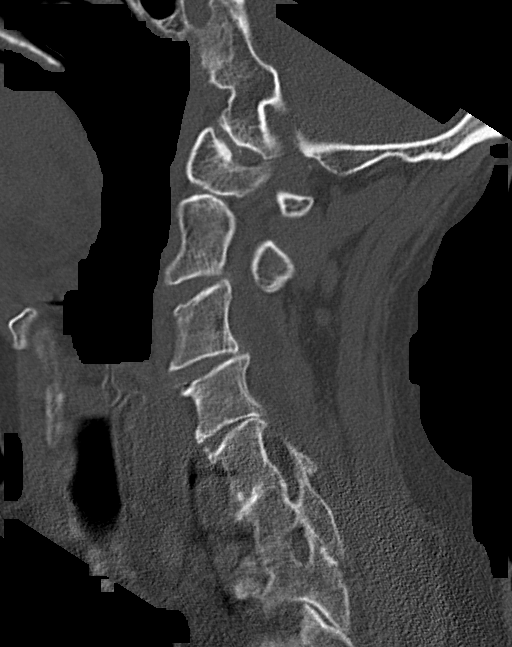
[im 30/60  soft-tissue]
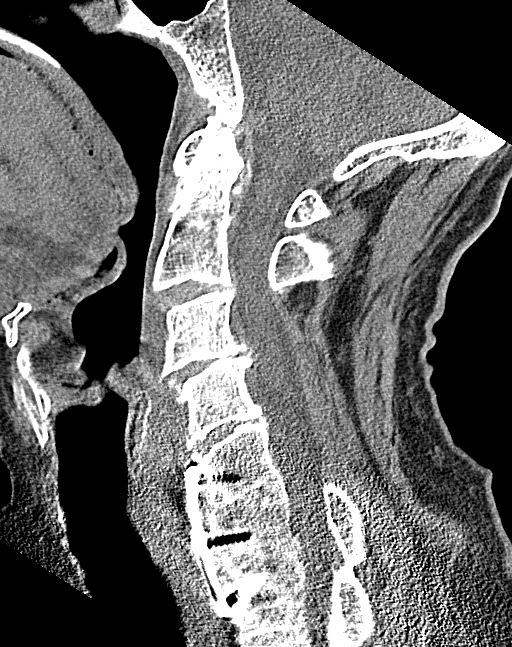
[im 30/60  bone]
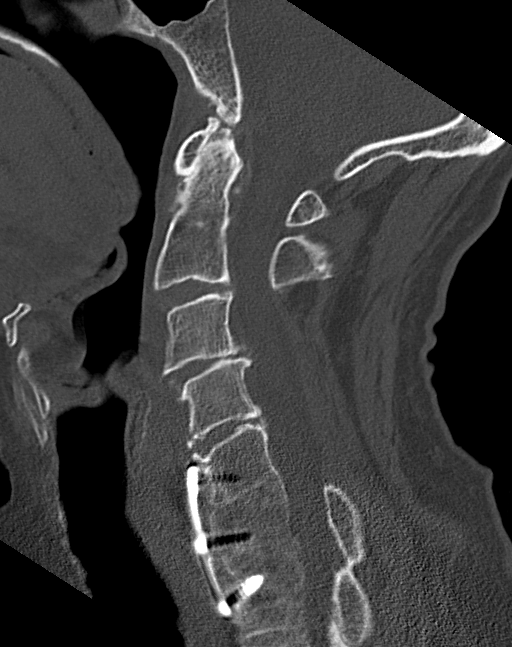
[im 35/60  bone]
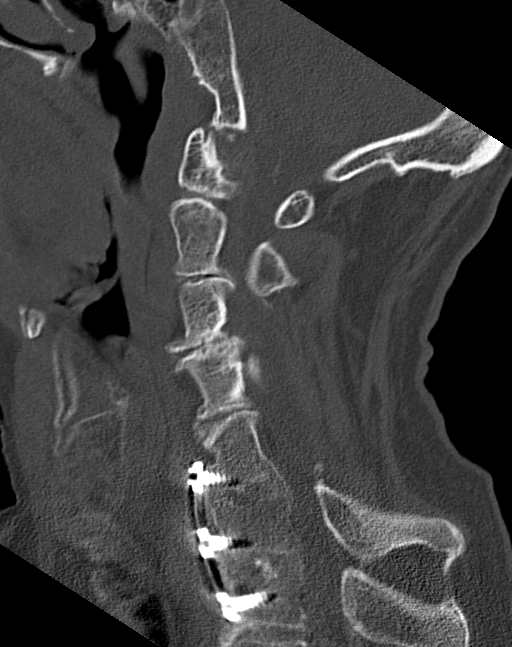
[im 40/60  bone]
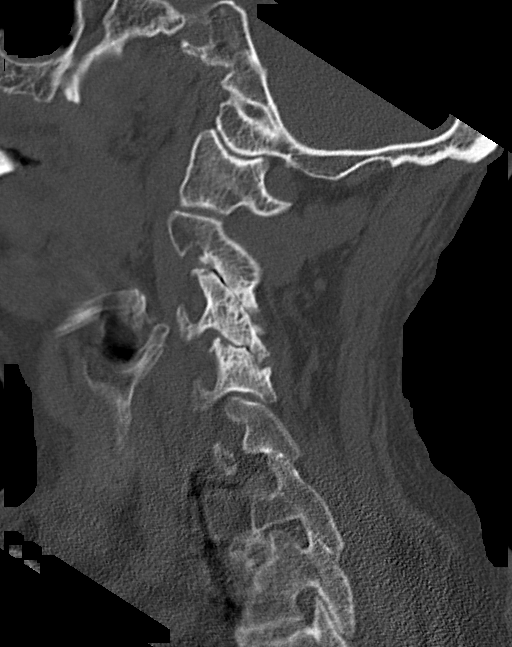

[Series 5: coronal bone · coronal · 0.26mm/px · 3 of 60 slices shown]
[im 18/60  bone]
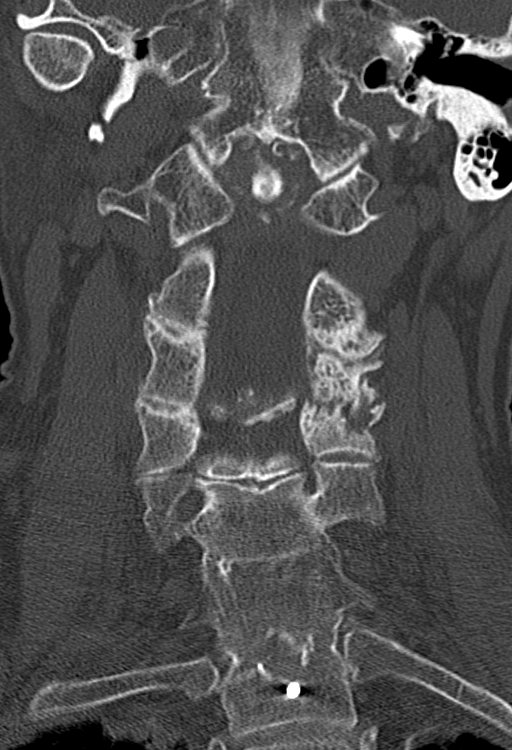
[im 26/60  bone]
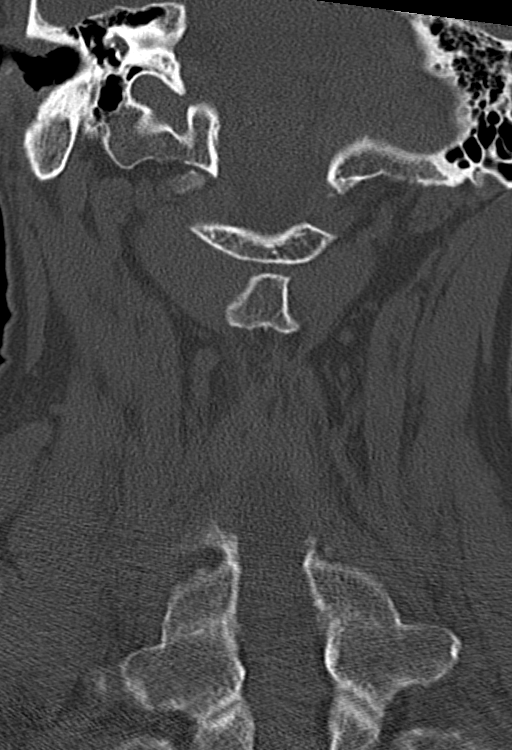
[im 34/60  bone]
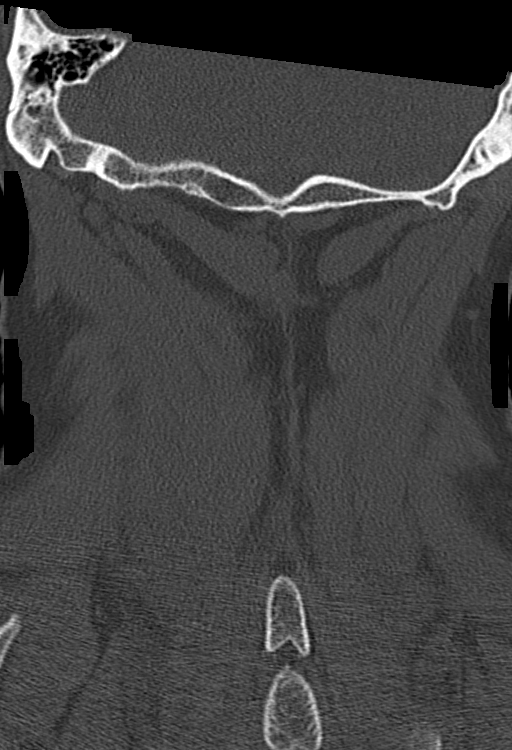

[Series 6: orthogonal bone · axial · 0.27mm/px · z∈[+285,+392]mm · 5 of 97 slices shown, 7 images]
[im 17/97  soft-tissue]
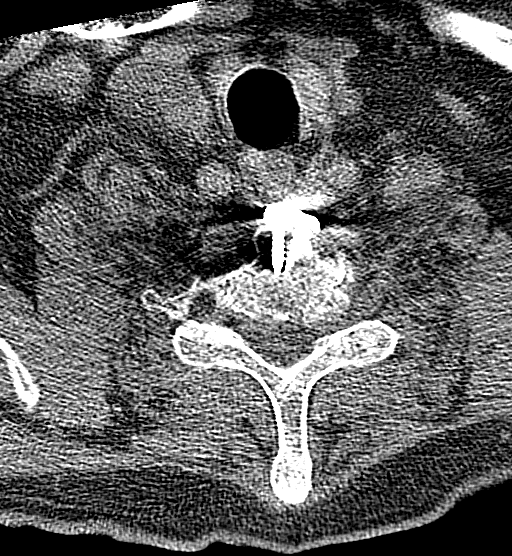
[im 17/97  bone]
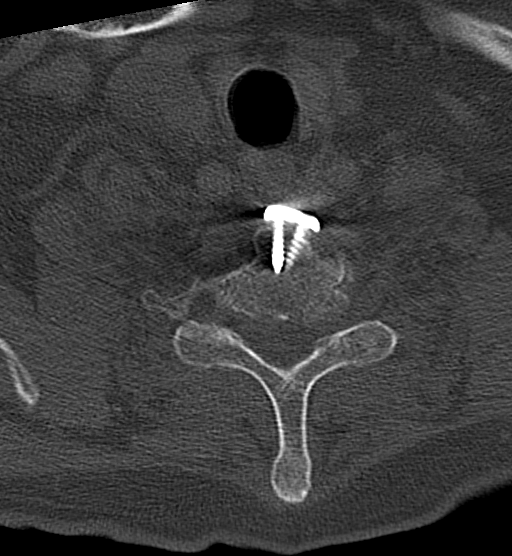
[im 33/97  bone]
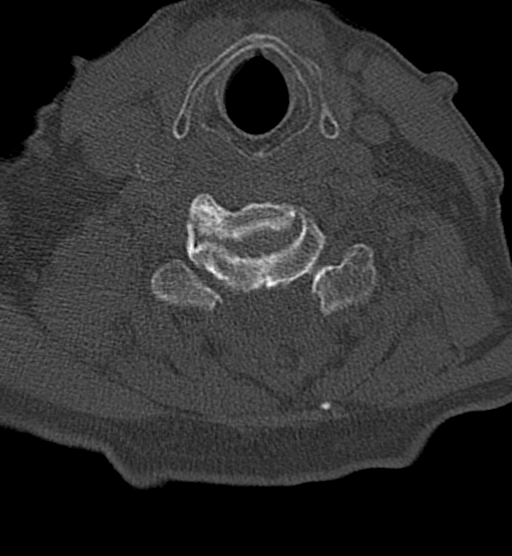
[im 49/97  bone]
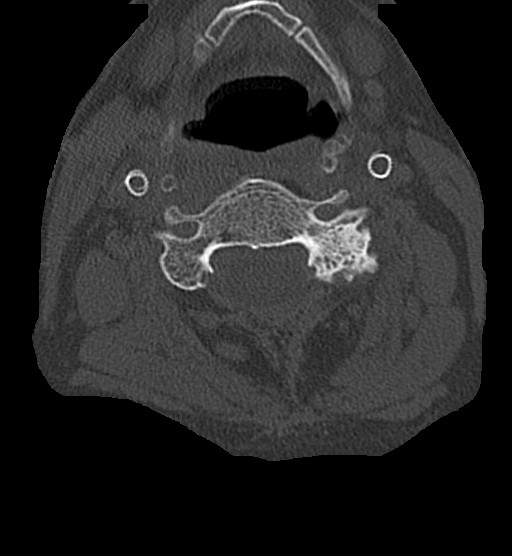
[im 65/97  bone]
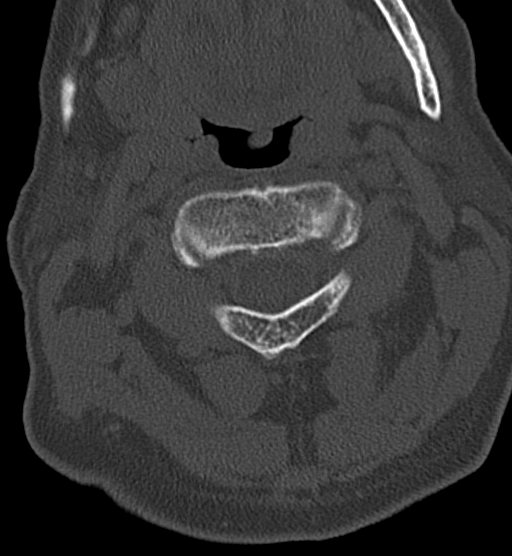
[im 81/97  soft-tissue]
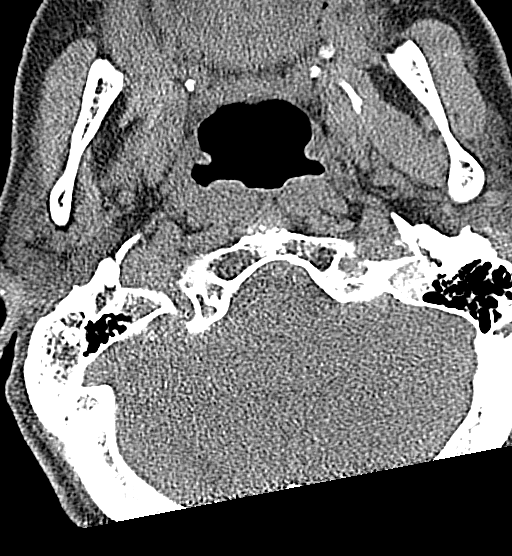
[im 81/97  bone]
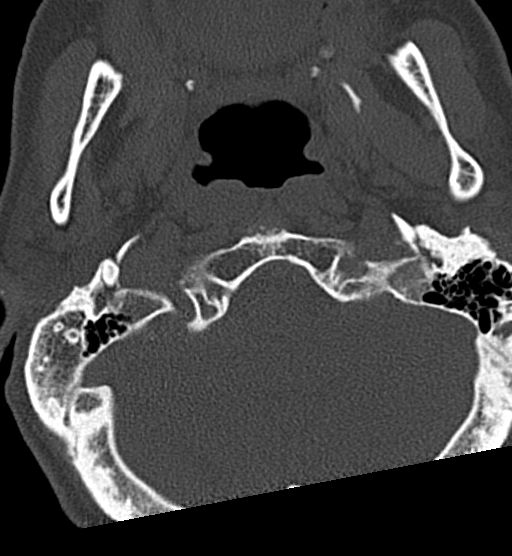

[13 of 35 positions shown; findings below may reference images not displayed]

FINDINGS: CT HEAD FINDINGS

Brain: Similar age related global parenchymal volume loss. Unchanged
mild burden of periventricular and deep white matter chronic
microvascular ischemic changes. There is no acute intracranial
hemorrhage. No mass effect or midline shift. No extra-axial fluid
collection.

Vascular: No hyperdense vessel. Atherosclerotic vascular
calcification.

Skull: No acute osseous abnormality.

Sinuses/Orbits: Unremarkable

Other: None

CT CERVICAL SPINE FINDINGS

Alignment: Straightening of the normal cervical lordosis. No
evidence of listhesis.

Skull base and vertebrae: No acute fracture. No primary bone lesion
or focal pathologic process. Prior posterior decompression with
cervical fusion spanning from C5-C7 without evidence of hardware
complication. Additionally, there is bony ankylosis across these
levels.

Soft tissues and spinal canal: No prevertebral fluid or swelling. No
visible canal hematoma.

Disc levels: Multilevel degenerative changes spine with disc space
narrowing, facet/uncovertebral hypertrophy and disc osteophyte
complex ease.

Upper chest: Biapical scarring.

Other: Bilateral carotid artery stents.
IMPRESSION: 1. No evidence of acute intracranial abnormality.
2. No evidence of acute fracture or subluxation of the cervical
spine.

## 2020-08-07 IMAGING — CR DG CHEST 1V PORT
2 series · 2 of 2 positions shown · non-contrast
Comparison: Chest radiograph dated [DATE].

CLINICAL DATA: 75-year-old male with chest pain.

EXAM:
PORTABLE CHEST 1 VIEW

[chest ap (1 of 2)]
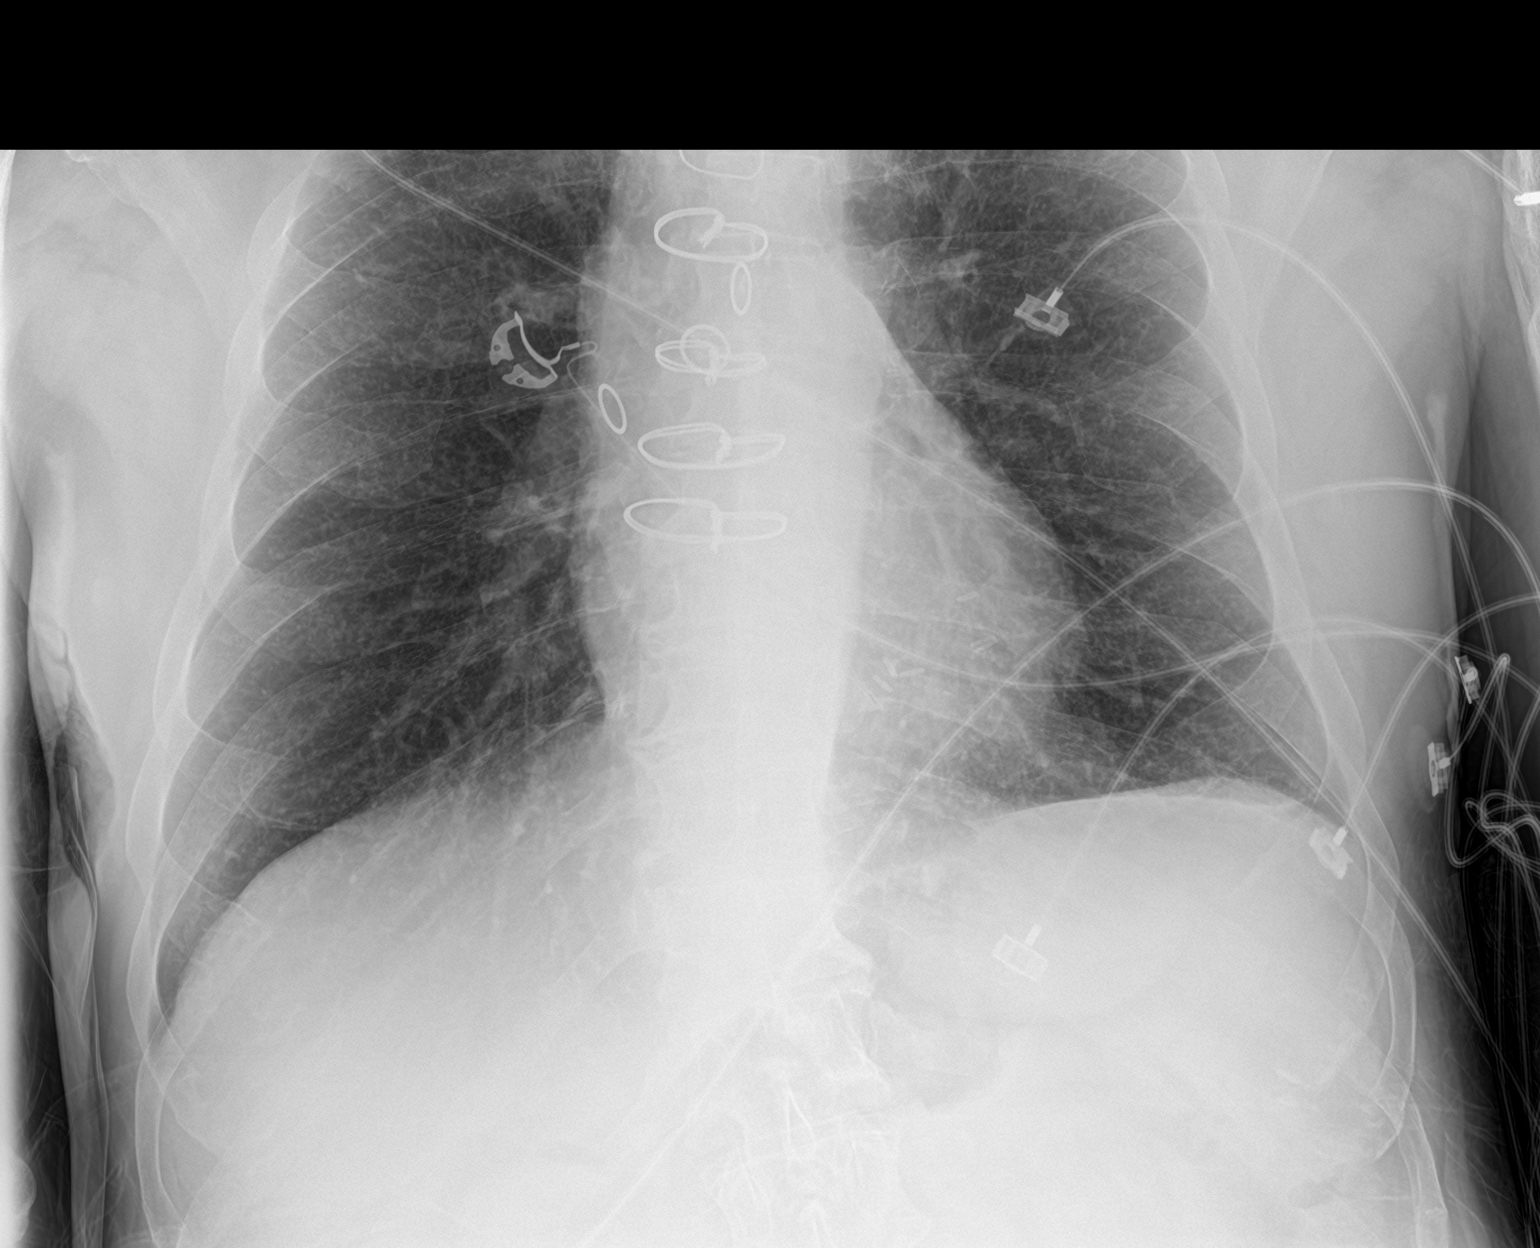

[chest ap (2 of 2)]
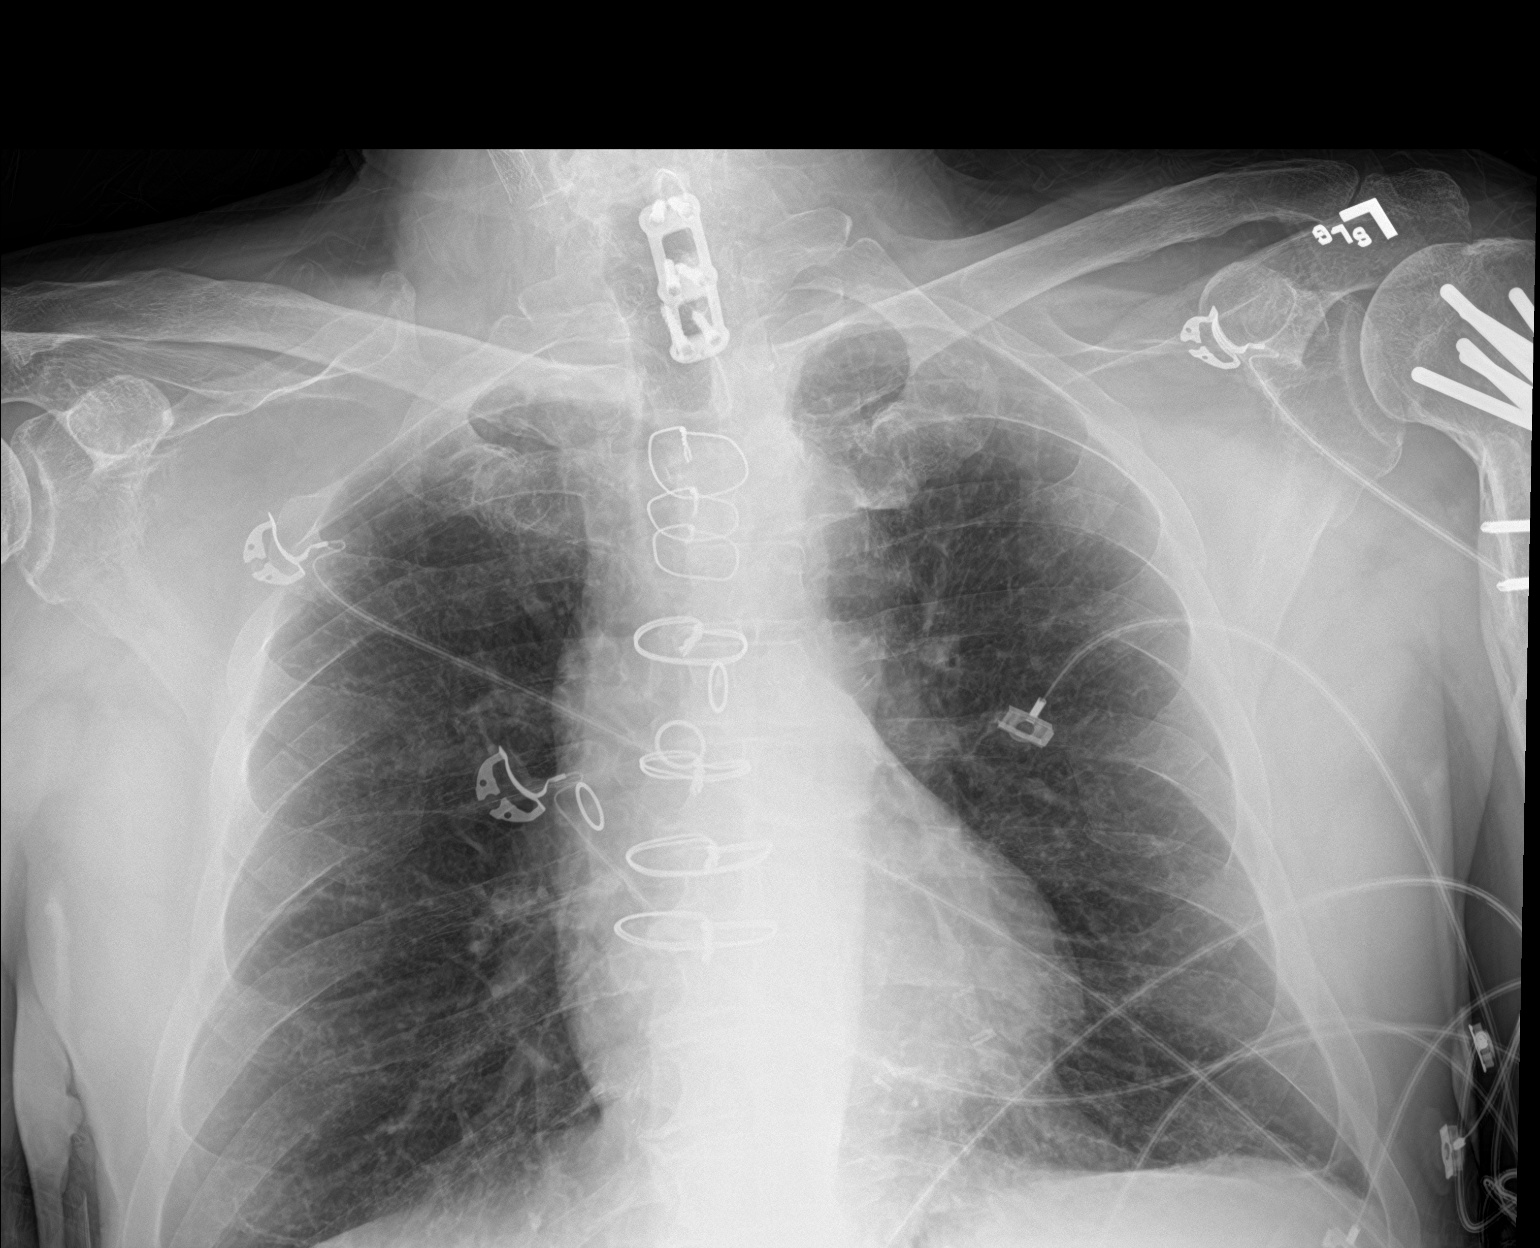

[2 of 2 positions shown; findings below may reference images not displayed]

FINDINGS: There is diffuse chronic interstitial coarsening and bronchitic
changes. No focal consolidation, pleural effusion or pneumothorax.
The cardiac silhouette is within limits. Median sternotomy wires and
CABG vascular clips. Osteopenia with degenerative changes of the
spine. Lower cervical ACDF and partially visualized left humeral
fixation hardware. No acute osseous pathology.
IMPRESSION: No active disease.

## 2020-08-07 IMAGING — CT CT HEAD W/O CM
3 series · 14 of 47 positions shown, 16 images · non-contrast
Comparison: MRI brain [DATE], head CT [DATE] CT
neck [DATE] and [DATE].

CLINICAL DATA: syncopal episode at home today. Pt has neck pain
headache, back pain. Stent placement 2 weeks ago. Pt on plavix

EXAM:
CT HEAD WITHOUT CONTRAST
CT CERVICAL SPINE WITHOUT CONTRAST
TECHNIQUE: Multidetector CT imaging of the head and cervical spine was
performed following the standard protocol without intravenous
contrast. Multiplanar CT image reconstructions of the cervical spine
were also generated.

[Series 2: head wo · axial · 0.41mm/px · z∈[+457,+582]mm · 8 of 30 slices shown, 10 images]
[im 3/30  brain]
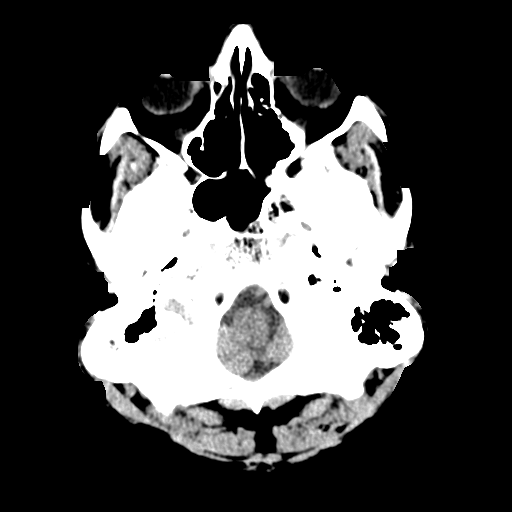
[im 3/30  bone]
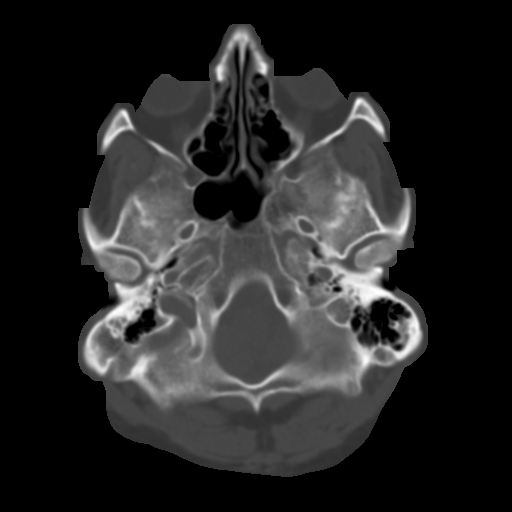
[im 7/30  brain]
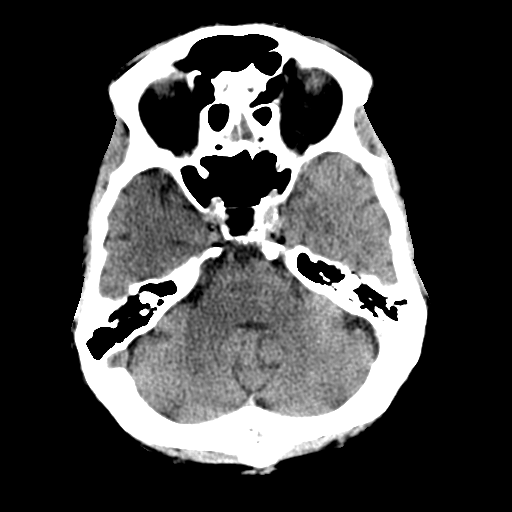
[im 10/30  brain]
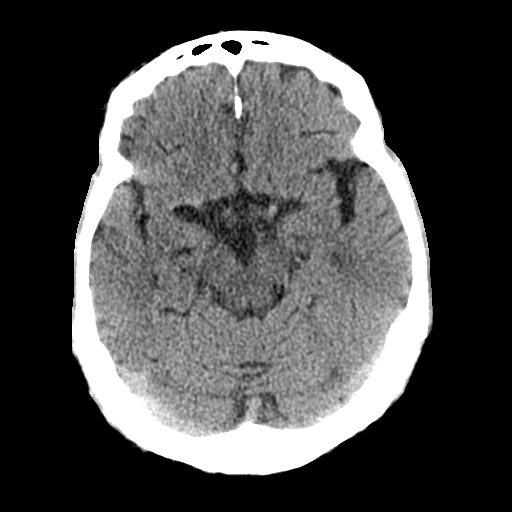
[im 14/30  brain]
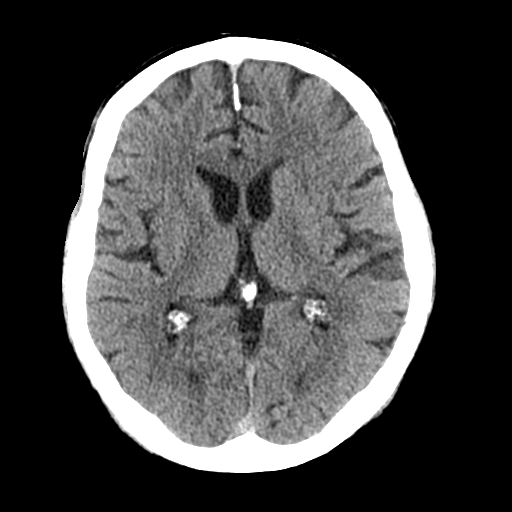
[im 17/30  brain]
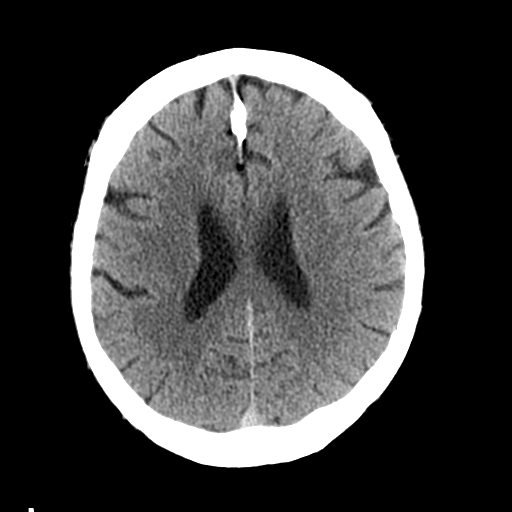
[im 17/30  bone]
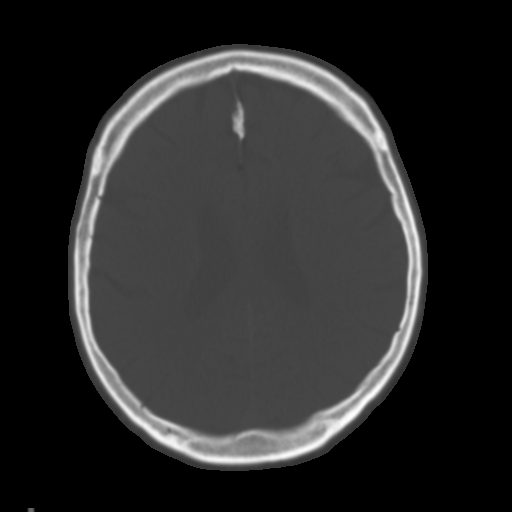
[im 21/30  brain]
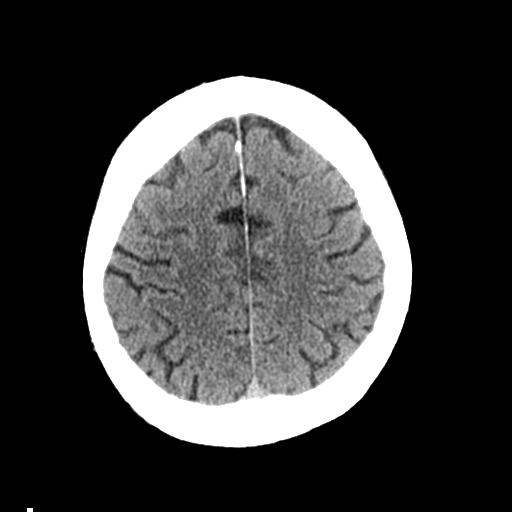
[im 24/30  brain]
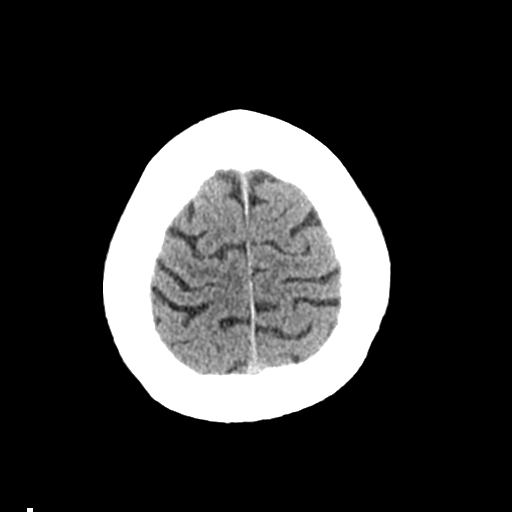
[im 28/30  brain]
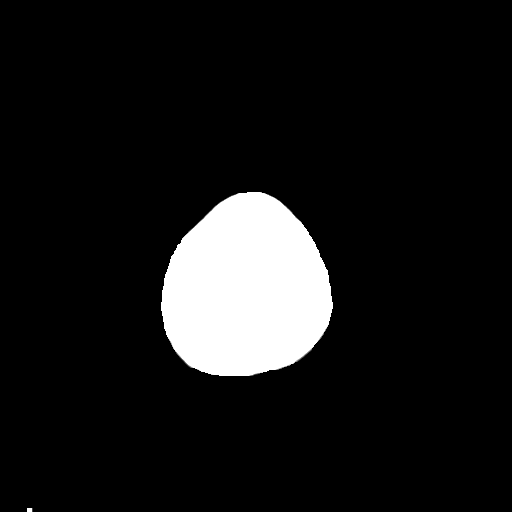

[Series 4: coronal soft tissue · coronal · 0.30mm/px · 3 of 63 slices shown]
[im 21/63  brain]
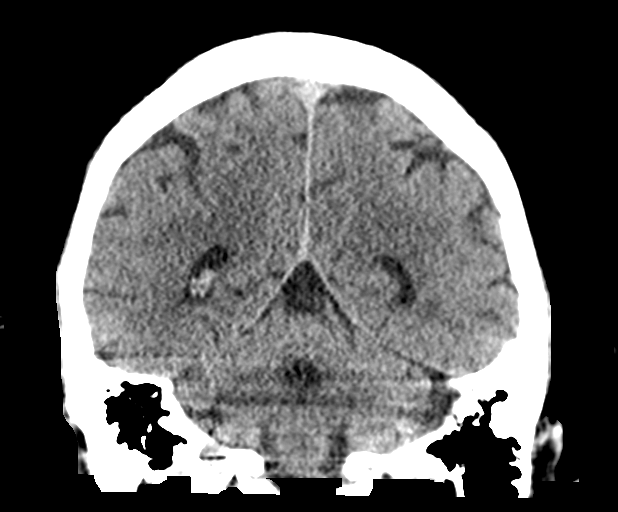
[im 28/63  brain]
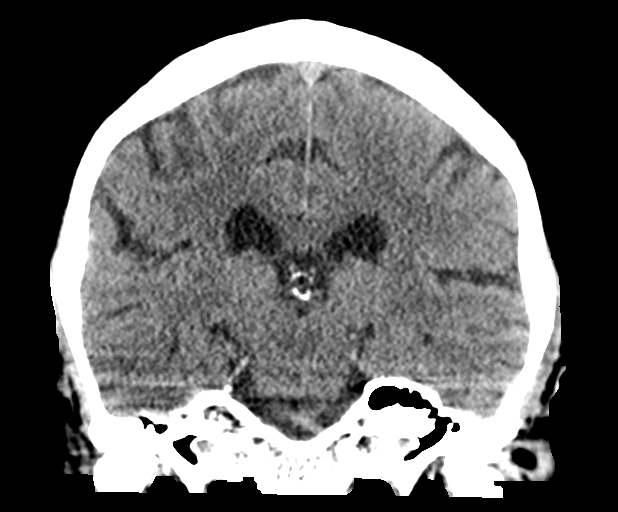
[im 35/63  brain]
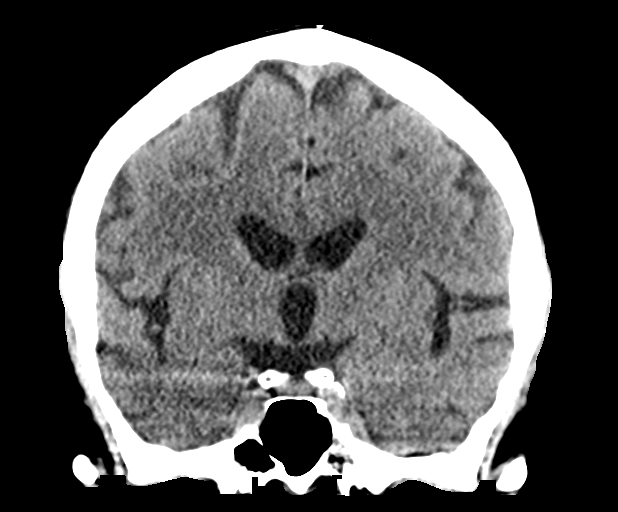

[Series 5: sagittal soft tissue · sagittal · 0.31mm/px · 3 of 54 slices shown]
[im 18/54  brain]
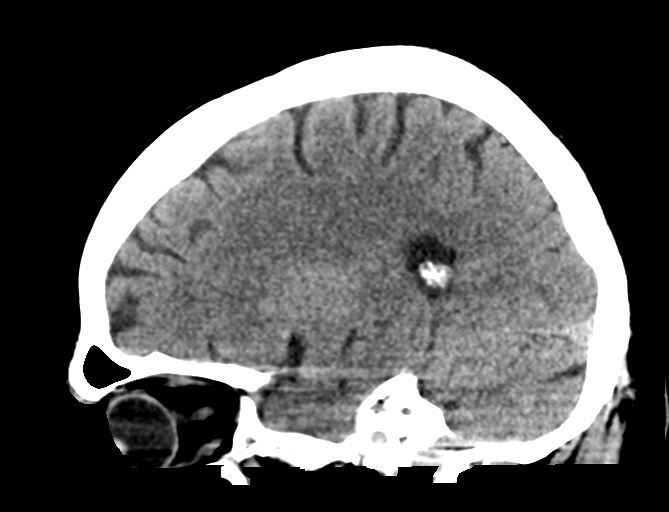
[im 27/54  brain]
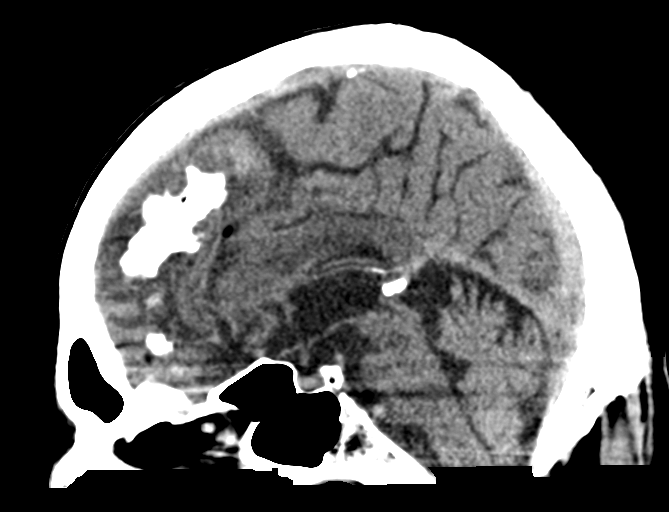
[im 36/54  brain]
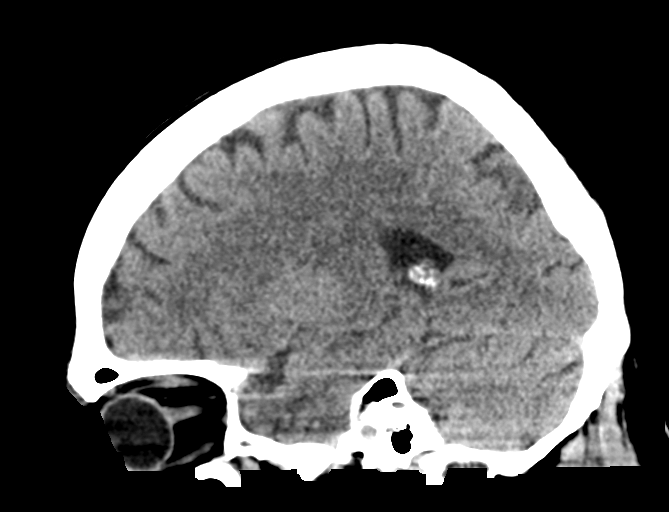

[14 of 47 positions shown; findings below may reference images not displayed]

FINDINGS: CT HEAD FINDINGS

Brain: Similar age related global parenchymal volume loss. Unchanged
mild burden of periventricular and deep white matter chronic
microvascular ischemic changes. There is no acute intracranial
hemorrhage. No mass effect or midline shift. No extra-axial fluid
collection.

Vascular: No hyperdense vessel. Atherosclerotic vascular
calcification.

Skull: No acute osseous abnormality.

Sinuses/Orbits: Unremarkable

Other: None

CT CERVICAL SPINE FINDINGS

Alignment: Straightening of the normal cervical lordosis. No
evidence of listhesis.

Skull base and vertebrae: No acute fracture. No primary bone lesion
or focal pathologic process. Prior posterior decompression with
cervical fusion spanning from C5-C7 without evidence of hardware
complication. Additionally, there is bony ankylosis across these
levels.

Soft tissues and spinal canal: No prevertebral fluid or swelling. No
visible canal hematoma.

Disc levels: Multilevel degenerative changes spine with disc space
narrowing, facet/uncovertebral hypertrophy and disc osteophyte
complex ease.

Upper chest: Biapical scarring.

Other: Bilateral carotid artery stents.
IMPRESSION: 1. No evidence of acute intracranial abnormality.
2. No evidence of acute fracture or subluxation of the cervical
spine.

## 2020-08-07 MED ORDER — MORPHINE SULFATE (PF) 4 MG/ML IV SOLN
4.0000 mg | Freq: Once | INTRAVENOUS | Status: AC
  Administered 2020-08-07: 4 mg via INTRAVENOUS
  Filled 2020-08-07: qty 1

## 2020-08-07 MED ORDER — OXYCODONE-ACETAMINOPHEN 5-325 MG PO TABS
1.0000 | ORAL_TABLET | Freq: Once | ORAL | Status: AC
Start: 2020-08-07 — End: 2020-08-07
  Administered 2020-08-07: 1 via ORAL
  Filled 2020-08-07: qty 1

## 2020-08-07 MED ORDER — SODIUM CHLORIDE 0.9 % IV BOLUS
1000.0000 mL | Freq: Once | INTRAVENOUS | Status: AC
  Administered 2020-08-07: 1000 mL via INTRAVENOUS

## 2020-08-07 MED ORDER — DEXAMETHASONE SODIUM PHOSPHATE 10 MG/ML IJ SOLN
10.0000 mg | Freq: Once | INTRAMUSCULAR | Status: AC
  Administered 2020-08-07: 10 mg via INTRAVENOUS
  Filled 2020-08-07: qty 1

## 2020-08-07 MED ORDER — GADOBUTROL 1 MMOL/ML IV SOLN
6.0000 mL | Freq: Once | INTRAVENOUS | Status: AC | PRN
  Administered 2020-08-07: 6 mL via INTRAVENOUS

## 2020-08-07 MED ORDER — OXYCODONE-ACETAMINOPHEN 5-325 MG PO TABS
1.0000 | ORAL_TABLET | Freq: Once | ORAL | Status: DC
  Filled 2020-08-07: qty 1

## 2020-08-07 MED ORDER — SODIUM CHLORIDE 0.9 % IV BOLUS
500.0000 mL | Freq: Once | INTRAVENOUS | Status: AC
  Administered 2020-08-07: 500 mL via INTRAVENOUS

## 2020-08-07 MED ORDER — ONDANSETRON HCL 4 MG/2ML IJ SOLN
4.0000 mg | Freq: Once | INTRAMUSCULAR | Status: AC
  Administered 2020-08-07: 4 mg via INTRAVENOUS
  Filled 2020-08-07: qty 2

## 2020-08-07 NOTE — ED Provider Notes (Signed)
[facility] Emergency Department Provider Note ____________________________________________   Event Date/Time   First MD Initiated Contact with Patient 08/07/20 1529     (approximate)  I have reviewed the triage vital signs and the nursing notes.   HISTORY  Chief Complaint Near Syncope    HPI Bryan Scott is a 75 y.o. male with PMH as noted below including recent admission for NSTEMI status post PCI who presents with an episode of syncope, acute onset today while he was at home in the kitchen.  The patient states that he felt a prodrome of lightheadedness and weakness and then passed out.  He states he fell to the floor and does not know how long he was down.  He reports that he hit his head and believes he hurt his neck as well.  He endorses headache, nausea, tingling and numbness running from the neck down both arms, chest pain, shortness of breath, and generalized weakness.  The patient reports that he had several episodes of vomiting yesterday and has been feeling somewhat lightheaded with intermittent near syncope since he left the hospital earlier this month.  Past Medical History:  Diagnosis Date  . 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 Cancer ([place])   . Carotid artery disease (HCC)    > 75% bilateral ICA stenoses on 01/2013 CT  . Chronic pain syndrome   . Coronary artery disease    s/p CABG ~ 2007  . GERD (gastroesophageal reflux disease)   . Heparin allergy    Bleeding  . History of tobacco use   . Hyperlipidemia   . Hypertension   . Ischemic cardiomyopathy   . Paroxysmal atrial fibrillation (HCC)    On Xarelto anticoagulation  . Type 2 diabetes mellitus ([place])    A1C 6.8% in 01/2013    Patient Active Problem List   Diagnosis Date Noted  . Acute pancreatitis 08/07/2020  . NSTEMI (non-ST elevated myocardial infarction) ([place]) 07/11/2020  . AKI (acute kidney injury) ([place]) 07/11/2020  . Acute metabolic encephalopathy 40/03/2724  . GERD  (gastroesophageal reflux disease) 09/09/2019  . CAD (coronary artery disease) 09/09/2019  . Chronic pain syndrome 09/09/2019  . Leukocytosis 09/09/2019  . Shortness of breath   . Hyponatremia 09/26/2016  . Chest pain 09/22/2016  . 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  . Abnormal EKG 09/21/2016  . Chronic pain 09/21/2016  . PAF (paroxysmal atrial fibrillation) ([place]) 09/21/2016  . Essential hypertension 05/02/2016  . Carotid stenosis 05/02/2016  . CAD in native artery   . Atypical chest pain   . Type 2 diabetes mellitus without complication ([place])   . Hyperlipidemia   . Pain of left upper extremity   . Angina pectoris ([place])   . Abdominal pain   . Chest pain, central 03/13/2015  . Hypertensive urgency 03/13/2015  . Spinal stenosis 01/10/2012    Past Surgical History:  Procedure Laterality Date  . CARDIAC CATHETERIZATION    . CORONARY ARTERY BYPASS GRAFT  2007  . CORONARY STENT INTERVENTION N/A 07/13/2020   Procedure: CORONARY STENT INTERVENTION;  Surgeon: Nelva Bush, MD;  Location: [place] [facility];  Service: Cardiovascular;  Laterality: N/A;  . LEFT HEART CATH AND CORS/GRAFTS ANGIOGRAPHY N/A 07/13/2020   Procedure: LEFT HEART CATH AND CORS/GRAFTS ANGIOGRAPHY;  Surgeon: Minna Merritts, MD;  Location: [place] [facility];  Service: Cardiovascular;  Laterality: N/A;    Prior to Admission medications   Medication Sig Start Date End Date Taking? Authorizing  Provider  amLODipine (NORVASC) 5 MG tablet Take 1 tablet (5 mg total) by mouth daily. Patient taking differently: Take 10 mg by mouth daily. 03/16/15   Nicholes Mango, MD  ascorbic acid (VITAMIN C) 500 MG tablet Take 500 mg by mouth daily.    [provider]  atorvastatin (LIPITOR) 80 MG tablet Take 80 mg by mouth daily.    [provider]  carvedilol (COREG) 3.125 MG tablet Take 1 tablet (3.125 mg total) by mouth 2 (two) times daily with a meal. 07/19/20   Shawna Clamp, MD   chlorthalidone (HYGROTON) 25 MG tablet Take 1 tablet by mouth daily. 02/01/20 02/01/21  [provider]  Cholecalciferol (VITAMIN D3) 125 MCG (5000 UT) CAPS Take 5,000 Units by mouth daily.    [provider]  clopidogrel (PLAVIX) 75 MG tablet Take 1 tablet (75 mg total) by mouth daily with breakfast. 07/20/20   Shawna Clamp, MD  empagliflozin (JARDIANCE) 25 MG TABS tablet Take 25 mg by mouth daily.    [provider]  ezetimibe (ZETIA) 10 MG tablet Take 1 tablet (10 mg total) by mouth daily. 09/11/19   Fritzi Mandes, MD  gabapentin (NEURONTIN) 100 MG capsule Take 100 mg by mouth 3 (three) times daily.    [provider]  glipiZIDE (GLUCOTROL) 10 MG tablet Take 10 mg by mouth 2 (two) times daily before a meal.    [provider]  guaiFENesin-dextromethorphan (ROBITUSSIN DM) 100-10 MG/5ML syrup Take 5 mLs by mouth every 4 (four) hours as needed for cough. 07/16/19   Thornell Mule, MD  hydrALAZINE (APRESOLINE) 25 MG tablet Take 25 mg by mouth 3 (three) times daily.    [provider]  isosorbide mononitrate (IMDUR) 30 MG 24 hr tablet Take 1 tablet (30 mg total) by mouth daily. 12/14/19   Mikhail, Velta Addison, DO  metFORMIN (GLUMETZA) 500 MG (MOD) 24 hr tablet Take 500 mg by mouth 2 (two) times daily with a meal.    [provider]  morphine (MS CONTIN) 15 MG 12 hr tablet Take 15 mg by mouth every 12 (twelve) hours as needed for pain.    [provider]  nitroGLYCERIN (NITROSTAT) 0.4 MG SL tablet Place 1 tablet (0.4 mg total) under the tongue every 5 (five) minutes as needed for chest pain. 07/19/20   Shawna Clamp, MD  ondansetron (ZOFRAN) 8 MG tablet Take 8 mg by mouth every 8 (eight) hours as needed for nausea or vomiting.     [provider]  rivaroxaban (XARELTO) 20 MG TABS tablet Take 1 tablet (20 mg total) by mouth daily with supper. 12/13/19   Cristal Ford, DO  spironolactone (ALDACTONE) 25 MG tablet Take 1 tablet (25 mg  total) by mouth daily. 09/11/19   Fritzi Mandes, MD  Zinc Sulfate (ZINC 15 PO) Take 1 tablet by mouth daily.    [provider]    Allergies Aspergum [aspirin], Heparin, and Lisinopril  Family History  Problem Relation Age of Onset  . Heart disease Mother 12       Deceased  . CVA Mother   . Seizures Father 54       Deceased from complications with seizures    Social History Social History   Tobacco Use  . Smoking status: Former Smoker    Packs/day: 1.00    Years: 30.00    Pack years: 30.00    Types: Cigarettes  . Smokeless tobacco: Never Used  . Tobacco comment: 30 pack-year history. Quit 8-9 years ago.  Substance Use Topics  . Alcohol use: No  . Drug use: No    Review of Systems  Constitutional: No fever.  Positive for generalized weakness. Eyes: No visual changes. ENT: No sore throat. Cardiovascular: Positive for chest pain. Respiratory: Positive for shortness of breath. Gastrointestinal: Positive for resolved vomiting. Genitourinary: Negative for flank pain. Musculoskeletal: Positive for neck and arm pain. Skin: Negative for rash. Neurological: Positive for headache.   ____________________________________________   PHYSICAL EXAM:  VITAL SIGNS: ED Triage Vitals [08/07/20 1526]  Enc Vitals Group     BP 132/79     Pulse Rate 77     Resp 20     Temp 98 F (36.7 C)     Temp Source Oral     SpO2 99 %     Weight 170 lb (77.1 kg)     Height 6' (1.829 m)     Head Circumference      Peak Flow      Pain Score 10     Pain Loc      Pain Edu?      Excl. in [place]?     Constitutional: Alert and oriented.  Anxious and uncomfortable appearing but in no acute distress. Eyes: Conjunctivae are normal.  EOMI.   Head: Atraumatic. Nose: No congestion/rhinnorhea. Mouth/Throat: Mucous membranes are slightly dry.   Neck: Normal range of motion.  Moderate midline cervical spinal tenderness with no step-off or crepitus. Cardiovascular: Normal rate, regular  rhythm. Grossly normal heart sounds.  Good peripheral circulation. Respiratory: Normal respiratory effort.  No retractions. Lungs CTAB. Gastrointestinal: Soft and nontender. No distention.  Genitourinary: No flank tenderness. Musculoskeletal: No lower extremity edema.  Extremities warm and well perfused.  Neurologic:  Normal speech and language.  4/5 motor strength of bilateral upper extremities (though appears to be due to poor effort as patient is able to freely move his arms towards his face).  Subjective diffuse numbness/tingling to bilateral arms, not following any specific nerve distribution.  Normal motor strength and intact sensation to bilateral lower extremities. Skin:  Skin is warm and dry. No rash noted. Psychiatric: Anxious appearing.  Speech and behavior are normal.  ____________________________________________   LABS (all labs ordered are listed, but only abnormal results are displayed)  Labs Reviewed  COMPREHENSIVE METABOLIC PANEL - Abnormal; Notable for the following components:      Result Value   Sodium 129 (*)    Potassium 3.3 (*)    Chloride 96 (*)    CO2 20 (*)    Glucose, Bld 128 (*)    BUN 30 (*)    Creatinine, Ser 1.29 (*)    Total Bilirubin 2.4 (*)    GFR, Estimated 58 (*)    All other components within normal limits  CBC WITH DIFFERENTIAL/PLATELET - Abnormal; Notable for the following components:   WBC 10.9 (*)    Neutro Abs 7.9 (*)    All other components within normal limits  URINALYSIS, COMPLETE (UACMP) WITH MICROSCOPIC - Abnormal; Notable for the following components:   Color, Urine YELLOW (*)    APPearance CLEAR (*)    Glucose, UA >=500 (*)    Ketones, ur 20 (*)    Bacteria, UA RARE (*)    All other components within normal limits  LIPASE, BLOOD - Abnormal; Notable for the following components:   Lipase 116 (*)    All other components within normal limits  RESP PANEL BY RT-PCR (FLU A&B, COVID) ARPGX2  BRAIN NATRIURETIC PEPTIDE  TROPONIN I  (  HIGH SENSITIVITY)  TROPONIN I (HIGH SENSITIVITY)   ____________________________________________  EKG  ED ECG REPORT I, Arta Silence, the attending physician, personally viewed and interpreted this ECG.  Date: 08/07/2020 EKG Time: 1532 Rate: 79 Rhythm: normal sinus rhythm QRS Axis: Borderline right axis Intervals: normal ST/T Wave abnormalities: Nonspecific T wave abnormalities, anterolateral Narrative Interpretation: Nonspecific abnormalities with no evidence of acute ischemia; no significant changes when compared to EKG of 07/23/2020  ____________________________________________  RADIOLOGY  Chest x-ray interpreted by me shows no focal infiltrate or edema CT head: No ICH CT cervical spine: No acute fracture  ____________________________________________   PROCEDURES  Procedure(s) performed: No  Procedures  Critical Care performed: No ____________________________________________   INITIAL IMPRESSION / ASSESSMENT AND PLAN / ED COURSE  Pertinent labs & imaging results that were available during my care of the patient were reviewed by me and considered in my medical decision making (see chart for details).  75 year old male with PMH as noted above including CAD, hypertension, paroxysmal atrial fibrillation, DM, and bladder cancer as well as recent admission for NSTEMI presents with an episode of syncope, causing him to fall and hit his head and hurt his neck.  He reports generalized weakness, dizziness, and multiple episodes of near syncope since he left the hospital few weeks ago, and states he had multiple episodes of vomiting yesterday.  I reviewed the past medical records in [place].  The patient was most recently admitted at the end of January with chest pain and blurred vision.  He is found to have an NSTEMI.  He had a catheterization and PCI and was started on Plavix and Xarelto.  On exam currently, the patient is somewhat anxious appearing.  His vital signs are  normal.  Physical exam is overall unremarkable except for some midline cervical spinal tenderness.  Lungs are clear to auscultation.  The patient reports subjective numbness and tingling to bilateral hands although this is diffuse and does not follow a specific nerve distribution.  His grip is slightly weak bilaterally but this appears to be due to pain and effort as he was able to freely move his arms towards his face to adjust his mask.  There is no evidence of focal neuro deficit.  EKG shows no acute changes.  Differential for the syncopal event is broad but includes recurrent ACS, arrhythmia, dehydration, electrolyte abnormality, other metabolic cause, infection.  Given that the patient is on Plavix and Xarelto we will obtain a CT head.  Given the midline cervical spinal tenderness we will obtain a CT of the cervical spine as well.  I have a low suspicion for actual cervical spinal injury although if the patient continues to report significant numbness or motor symptoms he may need an MRI.  ----------------------------------------- 7:00 PM on 08/07/2020 -----------------------------------------  CT head and cervical spine are negative for acute findings although the patient still reports numbness and pain going down both arms.  I have ordered an MRI for further evaluation.  He also reports that he has had significant epigastric pain over the last few days along with vomiting.  He is not acutely tender there at this time.  I added on a lipase.  ----------------------------------------- 11:44 PM on 08/07/2020 -----------------------------------------  The lipase is elevated.  MRI of the cervical spine shows foraminal and canal stenosis but no acute impingement or other acute abnormalities.  I discussed the findings with Dr. Lacinda Axon from neurosurgery.  He agrees with giving a steroid to help with the pain.  On reassessment, the patient  continues to have pain to both arms, still feels weak, and has  some epigastric pain.  Given the persistent symptoms as well as the presence of pancreatitis, I consulted Dr. Posey Pronto from the hospitalist service for admission.    ____________________________________________   FINAL CLINICAL IMPRESSION(S) / ED DIAGNOSES  Final diagnoses:  Syncope, unspecified syncope type  Acute pancreatitis, unspecified complication status, unspecified pancreatitis type  Cervical radiculopathy      NEW MEDICATIONS STARTED DURING THIS VISIT:  New Prescriptions   No medications on file     Note:  This document was prepared using Dragon voice recognition software and may include unintentional dictation errors.   Arta Silence, MD 08/07/20 [phone]

## 2020-08-07 NOTE — ED Notes (Signed)
meds given  md in with pt again.

## 2020-08-07 NOTE — ED Notes (Signed)
Pt refused 2nd percocet for pain.  Pt wants morphine

## 2020-08-07 NOTE — H&P (Signed)
History and Physical    Bryan Scott MRN:[id number] DOB: 12/22/1945 DOA: 08/07/2020  PCP: Bronstein, David, MD  Patient coming from: Home  I have personally briefly reviewed patient's old medical records in [facility]  Chief Complaint: Syncope  HPI: Bryan Scott is a 75 y.o. male with medical history significant for CAD s/p CABG (recent admission for NSTEMI s/p PCI with DES to SVG-RCA 07/13/2020), PAF on Xarelto, T2DM, HTN, HLD, CAS, cervical spinal stenosis s/p C5-C7 ACDF, chronic pain syndrome who presents to the ED for evaluation after syncopal episode at home.  Patient recently admitted 07/11/2020-07/21/2020 for NSTEMI.  Underwent LHC on 07/13/2020 and found to have multivessel CAD.  PCI with DES to SVG-RCA performed.  He was continued on Plavix and Xarelto on discharge.  Patient states since returning home he has had some vague abdominal pain and decreased oral intake.  He was having intermittent nausea and vomiting and over the last few days is a significantly worsened.  He has not been able to maintain any adequate oral intake and frequency of emesis has increased.  Prior to ED arrival he stood up to walk to the kitchen and became very lightheaded and syncopized.  He hit his cheek on the floor but did not suffer any significant injury.  He is not sure how long he was out and remembers when he awoke feeling confused.  He has not had any chest pain, dyspnea, diarrhea.  He also reports worsening of his chronic neck pain since his fall.  He is having numbness/tingling going down both of his arms and says it is painful to move his neck.  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  ED Course:  Initial vitals showed BP 125/77, pulse 79, RR 24, temp 98.0 F, SPO2 100% on room air.  Labs significant for sodium 129, potassium 3.3, bicarb 20, BUN 30, creatinine 1.29, serum glucose 128, AST 36, ALT 24, alk phos 64, total bilirubin 2.4, WBC 10.9,  hemoglobin 16.4, platelets 207,000, BNP 36.4, troponin 9x2, lipase 116.  Urinalysis shows negative nitrites, negative leukocytes, 6-10 RBC/hpf, no WBC/hpf on microscopy, rare bacteria.  SARS-CoV-2 PCR is negative.  Influenza A/B PCR's are negative.  CT head without contrast is negative for acute intracranial abnormality.  CT cervical spine without contrast is negative for evidence of acute fracture or subluxation of the C-spine.  Portable chest x-ray shows prior CABG changes and lower cervical ACDF without acute cardiopulmonary process.  MRI cervical spine with/without contrast shows C5-7 ACDF without spinal canal stenosis.  Severe bilateral neural foraminal stenosis at C3-4 and C4-5 noted.  Changes consistent with myelomalacia within the spinal cord at the C4 level also noted.  Patient was given IV morphine x2, Percocet, Zofran, 1.5 L normal saline.  EDP discussed with on-call neurosurgery, Dr. Cook, who recommended giving steroids to help with pain.  Patient was given IV Decadron 10 mg once.  The hospitalist service was consulted to admit for further evaluation and management.  Review of Systems: All systems reviewed and are negative except as documented in history of present illness above.   Past Medical History:  Diagnosis Date  . 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 Cancer (HCC)   . Carotid artery disease (HCC)    > 75% bilateral ICA stenoses on 01/2013 CT  . Chronic pain syndrome   . Coronary artery disease    s/p CABG ~ 2007  . GERD (  gastroesophageal reflux disease)   . Heparin allergy    Bleeding  . History of tobacco use   . Hyperlipidemia   . Hypertension   . Ischemic cardiomyopathy   . Paroxysmal atrial fibrillation (HCC)    On Xarelto anticoagulation  . Type 2 diabetes mellitus (HCC)    A1C 6.8% in 01/2013    Past Surgical History:  Procedure Laterality Date  . CARDIAC CATHETERIZATION    . CORONARY ARTERY BYPASS GRAFT  2007  . CORONARY STENT INTERVENTION N/A  07/13/2020   Procedure: CORONARY STENT INTERVENTION;  Surgeon: Nelva Bush, MD;  Location: [facility];  Service: Cardiovascular;  Laterality: N/A;  . LEFT HEART CATH AND CORS/GRAFTS ANGIOGRAPHY N/A 07/13/2020   Procedure: LEFT HEART CATH AND CORS/GRAFTS ANGIOGRAPHY;  Surgeon: Minna Merritts, MD;  Location: [facility];  Service: Cardiovascular;  Laterality: N/A;    Social History:  reports that he has quit smoking. His smoking use included cigarettes. He has a 30.00 pack-year smoking history. He has never used smokeless tobacco. He reports that he does not drink alcohol and does not use drugs.  Allergies  Allergen Reactions  . 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 Heparin Other (See Comments)    Reaction:  Bleeding   . Lisinopril Swelling    Family History  Problem Relation Age of Onset  . Heart disease Mother 20       Deceased  . CVA Mother   . Seizures Father 79       Deceased from complications with seizures     Prior to Admission medications   Medication Sig Start Date End Date Taking? Authorizing Provider  amLODipine (NORVASC) 5 MG tablet Take 1 tablet (5 mg total) by mouth daily. Patient taking differently: Take 10 mg by mouth daily. 03/16/15   Nicholes Mango, MD  ascorbic acid (VITAMIN C) 500 MG tablet Take 500 mg by mouth daily.    [provider]  atorvastatin (LIPITOR) 80 MG tablet Take 80 mg by mouth daily.    [provider]  carvedilol (COREG) 3.125 MG tablet Take 1 tablet (3.125 mg total) by mouth 2 (two) times daily with a meal. 07/19/20   Shawna Clamp, MD  chlorthalidone (HYGROTON) 25 MG tablet Take 1 tablet by mouth daily. 02/01/20 02/01/21  [provider]  Cholecalciferol (VITAMIN D3) 125 MCG (5000 UT) CAPS Take 5,000 Units by mouth daily.    [provider]  clopidogrel (PLAVIX) 75 MG tablet Take 1 tablet (75 mg total) by mouth daily with breakfast. 07/20/20   Shawna Clamp, MD  empagliflozin (JARDIANCE) 25 MG  TABS tablet Take 25 mg by mouth daily.    [provider]  ezetimibe (ZETIA) 10 MG tablet Take 1 tablet (10 mg total) by mouth daily. 09/11/19   Fritzi Mandes, MD  gabapentin (NEURONTIN) 100 MG capsule Take 100 mg by mouth 3 (three) times daily.    [provider]  glipiZIDE (GLUCOTROL) 10 MG tablet Take 10 mg by mouth 2 (two) times daily before a meal.    [provider]  guaiFENesin-dextromethorphan (ROBITUSSIN DM) 100-10 MG/5ML syrup Take 5 mLs by mouth every 4 (four) hours as needed for cough. 07/16/19   Thornell Mule, MD  hydrALAZINE (APRESOLINE) 25 MG tablet Take 25 mg by mouth 3 (three) times daily.    [provider]  isosorbide mononitrate (IMDUR) 30 MG 24 hr tablet Take 1 tablet (30 mg total) by mouth daily. 12/14/19   Cristal Ford, DO  metFORMIN (  GLUMETZA) 500 MG (MOD) 24 hr tablet Take 500 mg by mouth 2 (two) times daily with a meal.    [provider]  morphine (MS CONTIN) 15 MG 12 hr tablet Take 15 mg by mouth every 12 (twelve) hours as needed for pain.    [provider]  nitroGLYCERIN (NITROSTAT) 0.4 MG SL tablet Place 1 tablet (0.4 mg total) under the tongue every 5 (five) minutes as needed for chest pain. 07/19/20   Shawna Clamp, MD  ondansetron (ZOFRAN) 8 MG tablet Take 8 mg by mouth every 8 (eight) hours as needed for nausea or vomiting.     [provider]  rivaroxaban (XARELTO) 20 MG TABS tablet Take 1 tablet (20 mg total) by mouth daily with supper. 12/13/19   Cristal Ford, DO  spironolactone (ALDACTONE) 25 MG tablet Take 1 tablet (25 mg total) by mouth daily. 09/11/19   Fritzi Mandes, MD  Zinc Sulfate (ZINC 15 PO) Take 1 tablet by mouth daily.    [provider]    Physical Exam: Vitals:   08/07/20 2130 08/07/20 2245 08/07/20 2300 08/07/20 2315  BP:   (!) 136/99   Pulse: 67 71 66 70  Resp:  _0 Temp:      TempSrc:      SpO2: 99% 100% 100% 100%  Weight:      Height:       Constitutional:  Resting supine in bed, seems somewhat uncomfortable otherwise in NAD, calm Eyes: PERRL, lids and conjunctivae normal ENMT: Mucous membranes are moist. Posterior pharynx clear of any exudate or lesions.Normal dentition.  Neck: ROM diminished due to pain. Respiratory: clear to auscultation bilaterally, no wheezing, no crackles. Normal respiratory effort. No accessory muscle use.  Cardiovascular: Regular rate and rhythm, no murmurs / rubs / gallops. No extremity edema. 2+ pedal pulses. Abdomen: Mid lower abdominal tenderness, no masses palpated.  Bowel sounds positive.  Musculoskeletal: no clubbing / cyanosis. No joint deformity upper and lower extremities. Good ROM, no contractures. Normal muscle tone.  Skin: no rashes, lesions, ulcers. No induration Neurologic: CN 2-12 grossly intact. Sensation intact. Strength 5/5 in all 4.  Psychiatric: Normal judgment and insight. Alert and oriented x 3. Normal mood.   Labs on Admission: I have personally reviewed following labs and imaging studies  CBC: Recent Labs  Lab 08/07/20 1538  WBC 10.9*  NEUTROABS 7.9*  HGB 16.4  HCT 45.8  MCV 91.4  PLT 696   Basic Metabolic Panel: Recent Labs  Lab 08/07/20 1538  NA 129*  K 3.3*  CL 96*  CO2 20*  GLUCOSE 128*  BUN 30*  CREATININE 1.29*  CALCIUM 9.6   GFR: Estimated Creatinine Clearance: 54 mL/min (A) (by C-G formula based on SCr of 1.29 mg/dL (H)). Liver Function Tests: Recent Labs  Lab 08/07/20 1538  AST 36  ALT 24  ALKPHOS 64  BILITOT 2.4*  PROT 8.1  ALBUMIN 3.9   Recent Labs  Lab 08/07/20 1725  LIPASE 116*   No results for input(s): AMMONIA in the last 168 hours. Coagulation Profile: No results for input(s): INR, PROTIME in the last 168 hours. Cardiac Enzymes: No results for input(s): CKTOTAL, CKMB, CKMBINDEX, TROPONINI in the last 168 hours. BNP (last 3 results) No results for input(s): PROBNP in the last 8760 hours. HbA1C: No results for input(s): HGBA1C in the last 72  hours. CBG: No results for input(s): GLUCAP in the last 168 hours. Lipid Profile: No results for input(s): CHOL, HDL, LDLCALC, TRIG,  CHOLHDL, LDLDIRECT in the last 72 hours. Thyroid Function Tests: No results for input(s): TSH, T4TOTAL, FREET4, T3FREE, THYROIDAB in the last 72 hours. Anemia Panel: No results for input(s): VITAMINB12, FOLATE, FERRITIN, TIBC, IRON, RETICCTPCT in the last 72 hours. Urine analysis:    Component Value Date/Time   COLORURINE YELLOW (A) 08/07/2020 2057   APPEARANCEUR CLEAR (A) 08/07/2020 2057   APPEARANCEUR Clear 10/15/2013 2200   LABSPEC 1.016 08/07/2020 2057   LABSPEC 1.012 10/15/2013 2200   PHURINE 6.0 08/07/2020 2057   GLUCOSEU >=500 (A) 08/07/2020 2057   GLUCOSEU Negative 10/15/2013 2200   HGBUR NEGATIVE 08/07/2020 2057   BILIRUBINUR NEGATIVE 08/07/2020 2057   BILIRUBINUR Negative 10/15/2013 2200   KETONESUR 20 (A) 08/07/2020 2057   PROTEINUR NEGATIVE 08/07/2020 2057   NITRITE NEGATIVE 08/07/2020 2057   LEUKOCYTESUR NEGATIVE 08/07/2020 2057   LEUKOCYTESUR Negative 10/15/2013 2200    Radiological Exams on Admission: CT Head Wo Contrast  Result Date: 08/07/2020 CLINICAL DATA:  syncopal episode at home today. Pt has neck pain headache, back pain. Stent placement 2 weeks ago. Pt on plavix EXAM: CT HEAD WITHOUT CONTRAST CT CERVICAL SPINE WITHOUT CONTRAST TECHNIQUE: Multidetector CT imaging of the head and cervical spine was performed following the standard protocol without intravenous contrast. Multiplanar CT image reconstructions of the cervical spine were also generated. COMPARISON:  MRI brain July 12, 2020, head CT September 08, 2019 CT neck February 09, 2013 and May 01, 2012. FINDINGS: CT HEAD FINDINGS Brain: Similar age related global parenchymal volume loss. Unchanged mild burden of periventricular and deep white matter chronic microvascular ischemic changes. There is no acute intracranial hemorrhage. No mass effect or midline shift. No extra-axial  fluid collection. Vascular: No hyperdense vessel. Atherosclerotic vascular calcification. Skull: No acute osseous abnormality. Sinuses/Orbits: Unremarkable Other: None CT CERVICAL SPINE FINDINGS Alignment: Straightening of the normal cervical lordosis. No evidence of listhesis. Skull base and vertebrae: No acute fracture. No primary bone lesion or focal pathologic process. Prior posterior decompression with cervical fusion spanning from C5-C7 without evidence of hardware complication. Additionally, there is bony ankylosis across these levels. Soft tissues and spinal canal: No prevertebral fluid or swelling. No visible canal hematoma. Disc levels: Multilevel degenerative changes spine with disc space narrowing, facet/uncovertebral hypertrophy and disc osteophyte complex ease. Upper chest: Biapical scarring. Other: Bilateral carotid artery stents. IMPRESSION: 1. No evidence of acute intracranial abnormality. 2. No evidence of acute fracture or subluxation of the cervical spine. Electronically Signed   By: Jeffrey  Waltz MD   On: 08/07/2020 16:22   CT Cervical Spine Wo Contrast  Result Date: 08/07/2020 CLINICAL DATA:  syncopal episode at home today. Pt has neck pain headache, back pain. Stent placement 2 weeks ago. Pt on plavix EXAM: CT HEAD WITHOUT CONTRAST CT CERVICAL SPINE WITHOUT CONTRAST TECHNIQUE: Multidetector CT imaging of the head and cervical spine was performed following the standard protocol without intravenous contrast. Multiplanar CT image reconstructions of the cervical spine were also generated. COMPARISON:  MRI brain July 12, 2020, head CT September 08, 2019 CT neck February 09, 2013 and May 01, 2012. FINDINGS: CT HEAD FINDINGS Brain: Similar age related global parenchymal volume loss. Unchanged mild burden of periventricular and deep white matter chronic microvascular ischemic changes. There is no acute intracranial hemorrhage. No mass effect or midline shift. No extra-axial fluid collection.  Vascular: No hyperdense vessel. Atherosclerotic vascular calcification. Skull: No acute osseous abnormality. Sinuses/Orbits: Unremarkable Other: None CT CERVICAL SPINE FINDINGS Alignment: Straightening of the normal cervical lordosis. No evidence of listhesis.   Skull base and vertebrae: No acute fracture. No primary bone lesion or focal pathologic process. Prior posterior decompression with cervical fusion spanning from C5-C7 without evidence of hardware complication. Additionally, there is bony ankylosis across these levels. Soft tissues and spinal canal: No prevertebral fluid or swelling. No visible canal hematoma. Disc levels: Multilevel degenerative changes spine with disc space narrowing, facet/uncovertebral hypertrophy and disc osteophyte complex ease. Upper chest: Biapical scarring. Other: Bilateral carotid artery stents. IMPRESSION: 1. No evidence of acute intracranial abnormality. 2. No evidence of acute fracture or subluxation of the cervical spine. Electronically Signed   By: Dahlia Bailiff MD   On: 08/07/2020 16:22   MR Cervical Spine W or Wo Contrast  Result Date: 08/07/2020 CLINICAL DATA:  Syncope with neck pain EXAM: MRI CERVICAL SPINE WITHOUT AND WITH CONTRAST TECHNIQUE: Multiplanar and multiecho pulse sequences of the cervical spine, to include the craniocervical junction and cervicothoracic junction, were obtained without and with intravenous contrast. CONTRAST:  37m GADAVIST GADOBUTROL 1 MMOL/ML IV SOLN COMPARISON:  None. FINDINGS: Alignment: Grade 1 anterolisthesis at C3-4 Vertebrae: C5-7 ACDF.  No acute fracture. Cord: Symmetric bilateral hyperintense T2-weighted signal within the spinal cord at the C4 level. Otherwise normal signal. Posterior Fossa, vertebral arteries, paraspinal tissues: Negative. Disc levels: C1-2: Negative C2-3: Left-greater-than-right facet and uncovertebral hypertrophy. Mild spinal canal stenosis. Severe left neural foraminal stenosis. C3-4: Small central disc extrusion  with components of superior and inferior migration. Mild facet hypertrophy. Mild spinal canal stenosis. Severe bilateral neural foraminal stenosis. C4-5: Small disc bulge with bilateral uncovertebral hypertrophy. There is no spinal canal stenosis. Severe bilateral neural foraminal stenosis. C5-6: Anterior fusion. There is no spinal canal stenosis. Mild bilateral neural foraminal stenosis. C6-7: Anterior fusion. There is no spinal canal stenosis. Mild left neural foraminal stenosis. C7-T1: Normal disc space and facet joints. There is no spinal canal stenosis. No neural foraminal stenosis. IMPRESSION: 1. C5-7 ACDF without spinal canal stenosis. 2. Severe bilateral neural foraminal stenosis at C3-4 and C4-5. 3. Mild spinal canal stenosis at C2-3 and C3-4. 4. Symmetric bilateral hyperintense T2-weighted signal within the spinal cord at the C4 level, consistent with myelomalacia. Electronically Signed   By: KUlyses JarredM.D.   On: 08/07/2020 22:43   DG Chest Portable 1 View  Result Date: 08/07/2020 CLINICAL DATA:  75year old male with chest pain. EXAM: PORTABLE CHEST 1 VIEW COMPARISON:  Chest radiograph dated 07/11/2020. FINDINGS: There is diffuse chronic interstitial coarsening and bronchitic changes. No focal consolidation, pleural effusion or pneumothorax. The cardiac silhouette is within limits. Median sternotomy wires and CABG vascular clips. Osteopenia with degenerative changes of the spine. Lower cervical ACDF and partially visualized left humeral fixation hardware. No acute osseous pathology. IMPRESSION: No active disease. Electronically Signed   By: AAnner CreteM.D.   On: 08/07/2020 16:34    EKG: Personally reviewed. Sinus rhythm without acute ischemic changes.  PVC no longer present when compared to prior.  Assessment/Plan Active Problems:   Type 2 diabetes mellitus without complication (HCC)   Hypertension associated with diabetes ([place]   PAF (paroxysmal atrial fibrillation) (HCC)    Hyponatremia   CAD (coronary artery disease)   AKI (acute kidney injury) ([place]   Acute pancreatitis   Hyperlipidemia associated with type 2 diabetes mellitus ([place]   Syncope Bryan KERSCHNERis a 75y.o. male with medical history significant for CAD s/p CABG ([facility] PAF on Xarelto, T2DM, HTN, HLD, CAS, cervical spinal stenosis s/p C5-C7  ACDF, chronic pain syndrome who is admitted with acute pancreatitis and syncope.  Acute pancreatitis: Likely ongoing the last several days while patient was at home.  Lipase mildly elevated at 116 however history consistent with acute pancreatitis.  -Keep n.p.o. except for sips with meds -Continue IV fluid hydration overnight -IV morphine 2 mg every 3 hours as needed for severe pain with hold parameters -Antiemetics as needed -RUQ ultrasound  Syncope: Likely orthostatic from hypovolemia due to GI losses.  Continue IV fluid hydration.  Check orthostatic vitals.  AKI: Mild and likely prerenal from hypovolemia.  Continue IV fluid hydration as above.  Holding home spironolactone.  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  Severe bilateral neuroforaminal stenosis at C3-4 and C4-5 noted as well as changes consistent with myelomalacia at the C4 level.  Pain likely worsened in setting of syncope with fall. -Given IV Decadron 10 mg once after ED discussion with on-call neurosurgery  CAD s/p CABG and recent NSTEMI s/p PCI with DES to [facility] 07/13/2020: Stable, denies any recurrent chest pain.  Continue Plavix and Xarelto.  Paroxysmal atrial fibrillation: Stable with rate controlled.  Continue Xarelto.  Coreg on hold given recent syncope.  Hypokalemia: Supplement via IV.  Hyponatremia: Mild from hypovolemia.  Continue IV fluids.  T2DM: Holding home [place].  Continue sensitive SSI q4h while  NPO.  HTN: Home meds on hold due to likely orthostatic syncope.  Hyperlipidemia: Atorvastatin and Zetia on hold for now.  Generalized weakness: PT eval.  DVT prophylaxis: Xarelto Code Status: Full code, confirmed with patient Family Communication: Discussed with patient, he has discussed with family Disposition Plan: From home, dispo pending symptomatic improvement, ability maintain adequate oral intake, PT eval Consults called: None Level of care: Med-Surg Admission status:  Status is: Observation  The patient remains OBS appropriate and will d/c before 2 midnights.  Dispo: The patient is from: Home              Anticipated d/c is to: Home              Anticipated d/c date is: 1 day              Patient currently is not medically stable to d/c.    Zada Finders MD [facility]  If 7PM-7AM, please contact night-coverage [email or web address]  08/07/2020, 11:52 PM

## 2020-08-07 NOTE — ED Notes (Signed)
Pt to ct scan  meds given.

## 2020-08-07 NOTE — ED Notes (Signed)
Pt to mri 

## 2020-08-07 NOTE — ED Notes (Signed)
md in with pt and family.   

## 2020-08-07 NOTE — ED Triage Notes (Signed)
Pt brought in via ems from home with syncopal episode at home today.  Pt has neck pain headache, back pain.  Stent placement 2 weeks ago.  Pt on plavix  Pt alert speech clear

## 2020-08-08 ENCOUNTER — Encounter: Payer: Self-pay | Admitting: Internal Medicine

## 2020-08-08 ENCOUNTER — Observation Stay: Payer: Medicare PPO

## 2020-08-08 DIAGNOSIS — M5412 Radiculopathy, cervical region: Secondary | ICD-10-CM | POA: Diagnosis not present

## 2020-08-08 DIAGNOSIS — N179 Acute kidney failure, unspecified: Secondary | ICD-10-CM | POA: Diagnosis not present

## 2020-08-08 DIAGNOSIS — I2510 Atherosclerotic heart disease of native coronary artery without angina pectoris: Secondary | ICD-10-CM | POA: Diagnosis not present

## 2020-08-08 DIAGNOSIS — K859 Acute pancreatitis without necrosis or infection, unspecified: Secondary | ICD-10-CM | POA: Diagnosis not present

## 2020-08-08 LAB — COMPREHENSIVE METABOLIC PANEL
ALT: 24 U/L (ref 0–44)
AST: 24 U/L (ref 15–41)
Albumin: 3.6 g/dL (ref 3.5–5.0)
Alkaline Phosphatase: 62 U/L (ref 38–126)
Anion gap: 12 (ref 5–15)
BUN: 24 mg/dL — ABNORMAL HIGH (ref 8–23)
CO2: 16 mmol/L — ABNORMAL LOW (ref 22–32)
Calcium: 8.7 mg/dL — ABNORMAL LOW (ref 8.9–10.3)
Chloride: 101 mmol/L (ref 98–111)
Creatinine, Ser: 1.08 mg/dL (ref 0.61–1.24)
GFR, Estimated: >60 mL/min (ref >60)
Glucose, Bld: 168 mg/dL — ABNORMAL HIGH (ref 70–99)
Potassium: 4.1 mmol/L (ref 3.5–5.1)
Sodium: 129 mmol/L — ABNORMAL LOW (ref 135–145)
Total Bilirubin: 3.1 mg/dL — ABNORMAL HIGH (ref 0.3–1.2)
Total Protein: 7.3 g/dL (ref 6.5–8.1)

## 2020-08-08 LAB — MAGNESIUM: Magnesium: 1.9 mg/dL (ref 1.7–2.4)

## 2020-08-08 LAB — LIPID PANEL
Cholesterol: 95 mg/dL (ref 0–200)
HDL: 42 mg/dL (ref >40)
LDL Cholesterol: 29 mg/dL (ref 0–99)
Total CHOL/HDL Ratio: 2.3 RATIO
Triglycerides: 121 mg/dL (ref <150)
VLDL: 24 mg/dL (ref 0–40)

## 2020-08-08 LAB — CBC
HCT: 46.2 % (ref 39.0–52.0)
Hemoglobin: 16 g/dL (ref 13.0–17.0)
MCH: 32.6 pg (ref 26.0–34.0)
MCHC: 34.6 g/dL (ref 30.0–36.0)
MCV: 94.1 fL (ref 80.0–100.0)
Platelets: 185 10*3/uL (ref 150–400)
RBC: 4.91 MIL/uL (ref 4.22–5.81)
RDW: 12.3 % (ref 11.5–15.5)
WBC: 12.1 10*3/uL — ABNORMAL HIGH (ref 4.0–10.5)
nRBC: 0 % (ref 0.0–0.2)

## 2020-08-08 LAB — LIPASE, BLOOD: Lipase: 36 U/L (ref 11–51)

## 2020-08-08 LAB — GLUCOSE, CAPILLARY
Glucose-Capillary: 184 mg/dL — ABNORMAL HIGH (ref 70–99)
Glucose-Capillary: 345 mg/dL — ABNORMAL HIGH (ref 70–99)
Glucose-Capillary: 291 mg/dL — ABNORMAL HIGH (ref 70–99)
Glucose-Capillary: 201 mg/dL — ABNORMAL HIGH (ref 70–99)

## 2020-08-08 LAB — CBG MONITORING, ED: Glucose-Capillary: 128 mg/dL — ABNORMAL HIGH (ref 70–99)

## 2020-08-08 IMAGING — US US ABDOMEN LIMITED RUQ/ASCITES
1 series · 14 of 25 positions shown · non-contrast
Comparison: Ultrasound [DATE]

CLINICAL DATA: Acute pancreatitis

EXAM:
ULTRASOUND ABDOMEN LIMITED RIGHT UPPER QUADRANT

[Series 1: us abdomen limited ruq (liver/gb) · 14 of 30 slices shown]
[im 1/30]
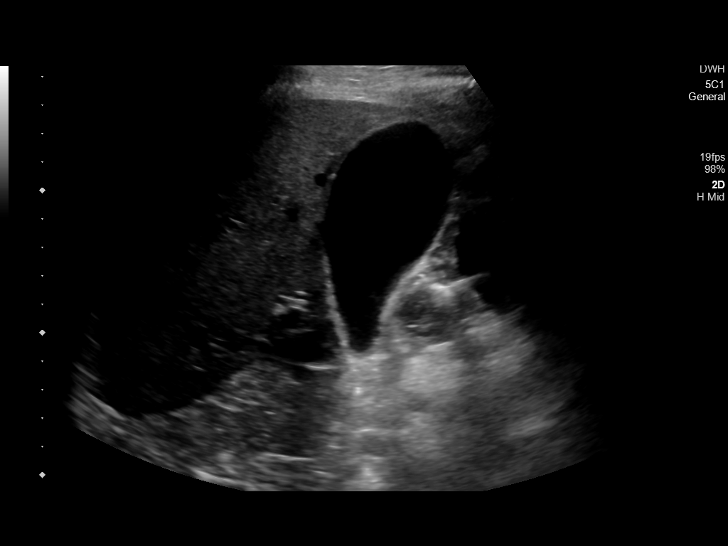
[im 3/30]
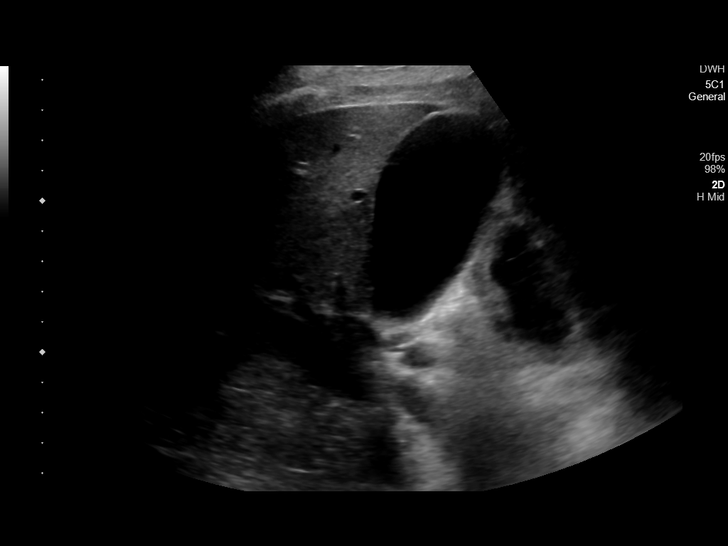
[im 5/30]
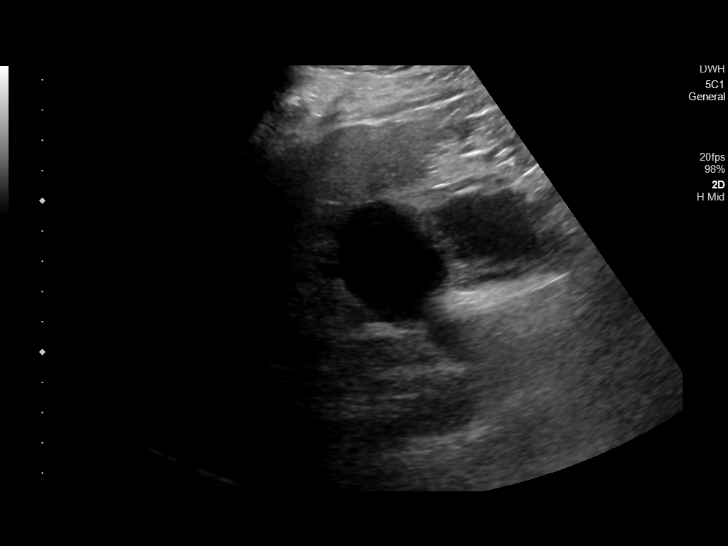
[im 8/30]
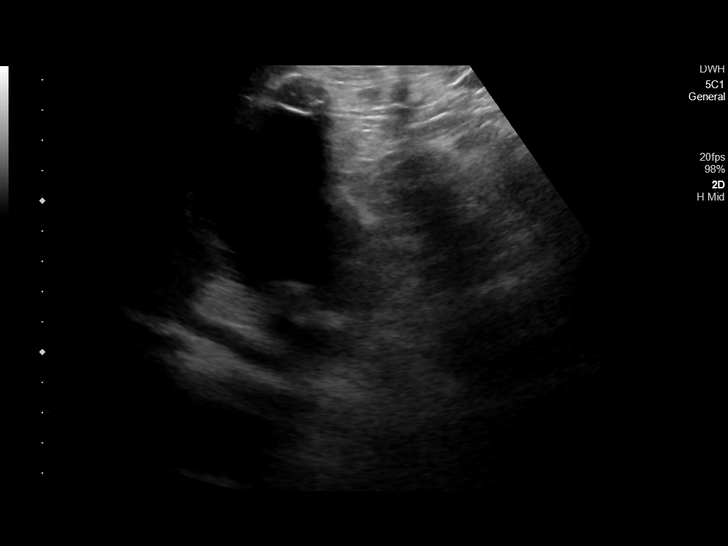
[im 10/30]
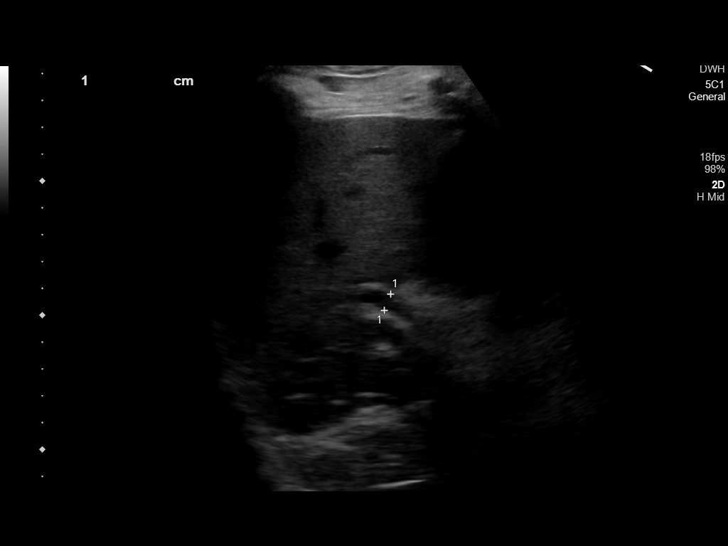
[im 11/30]
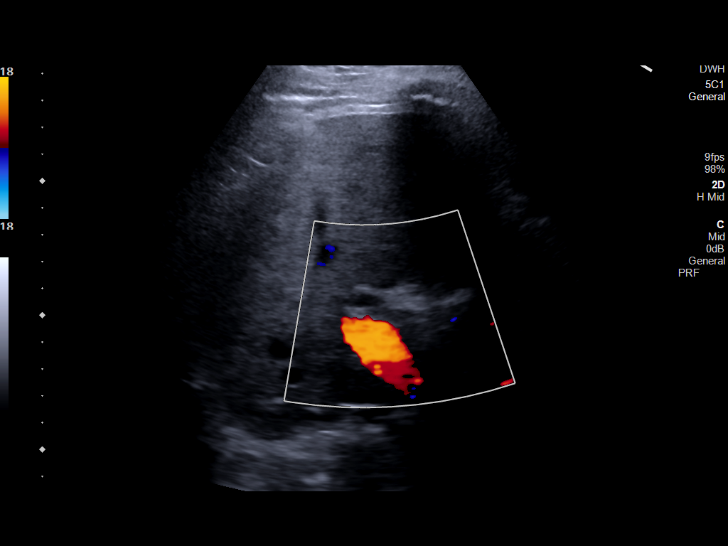
[im 14/30]
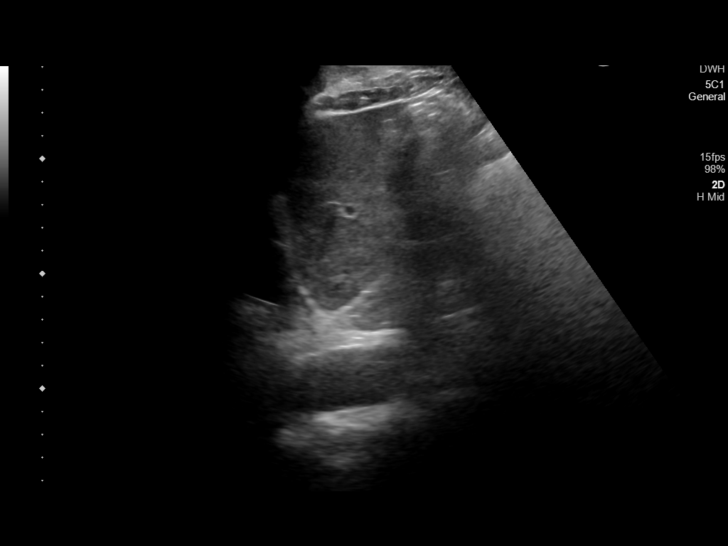
[im 16/30]
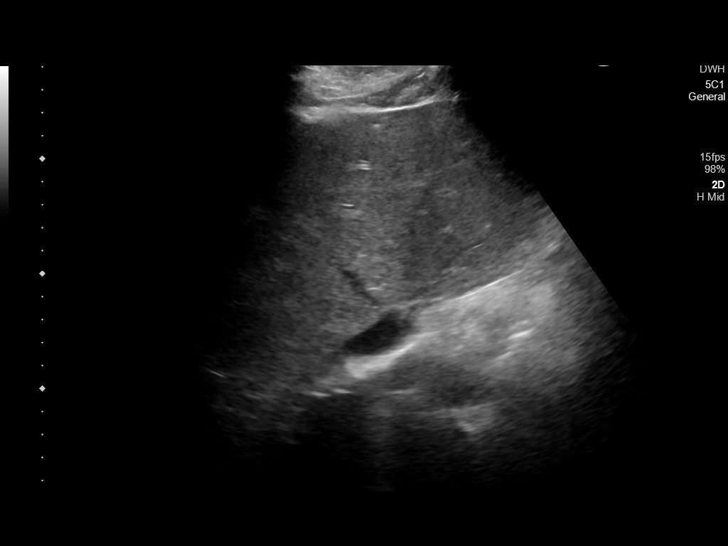
[im 19/30]
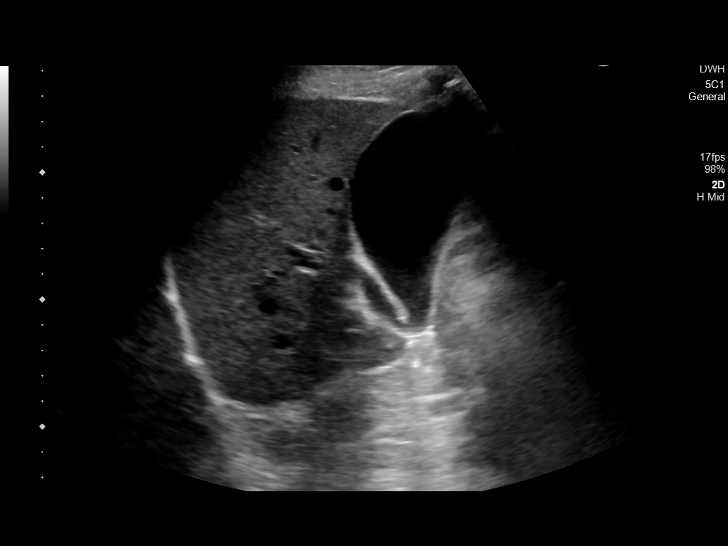
[im 20/30]
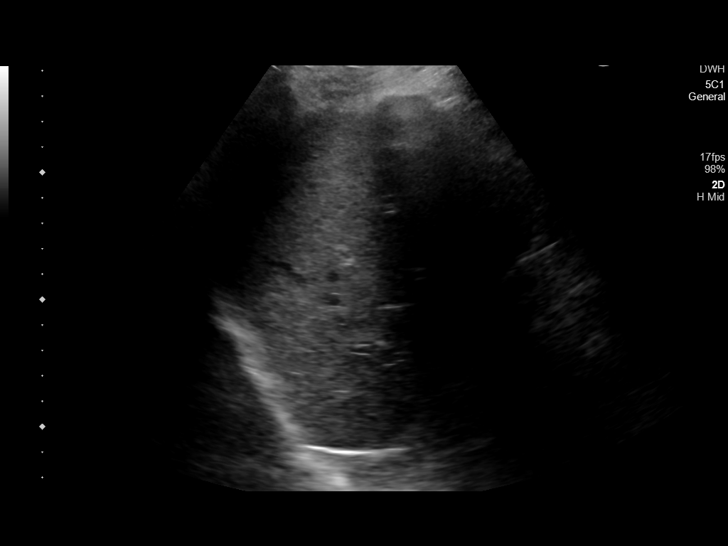
[im 22/30]
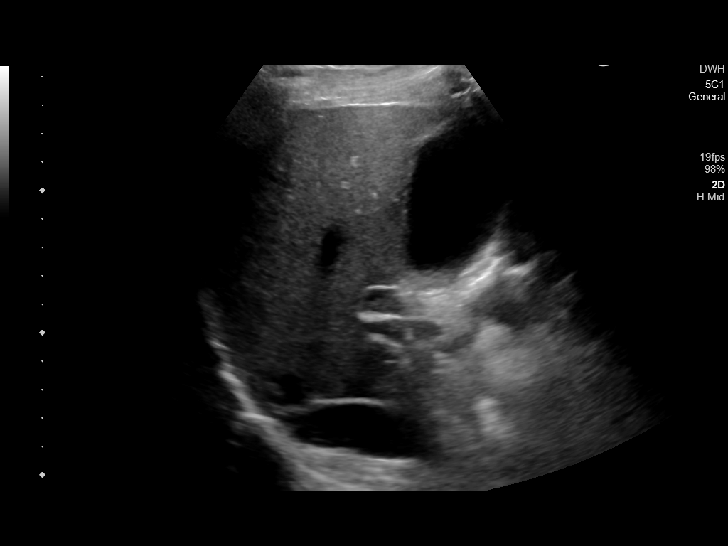
[im 25/30]
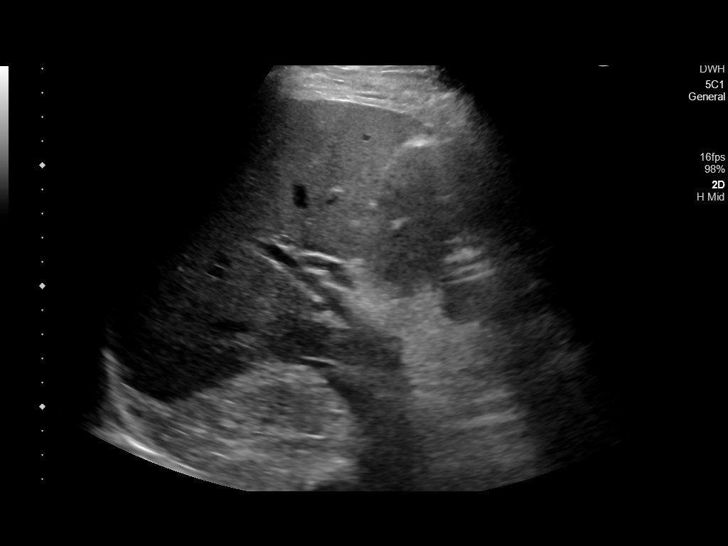
[im 27/30]
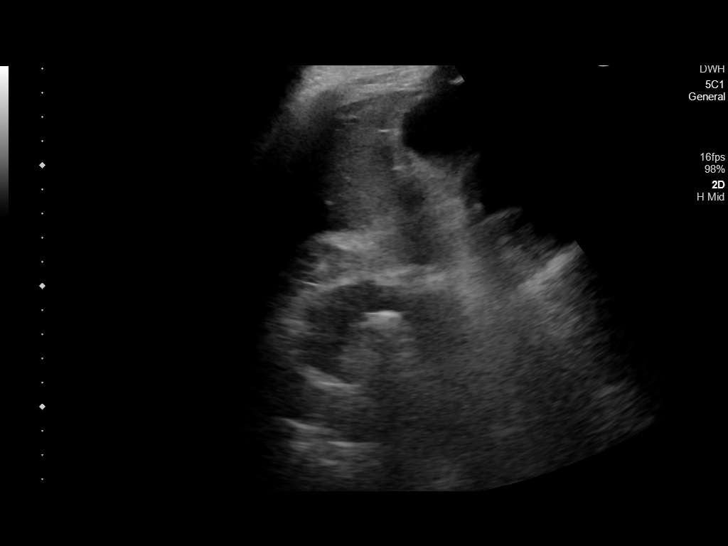
[im 30/30]
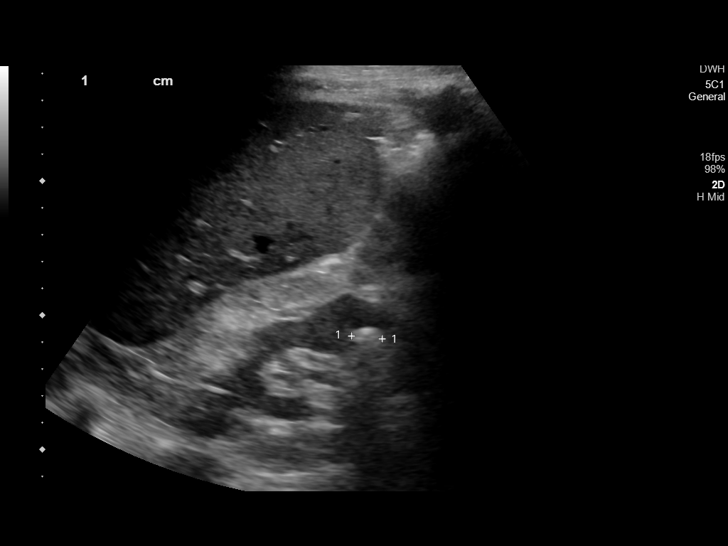

[14 of 25 positions shown; findings below may reference images not displayed]

FINDINGS: Gallbladder:

No gallstones or wall thickening visualized. No sonographic Murphy
sign noted by sonographer.

Common bile duct:

Diameter: 6.5 mm, within normal limits for senescent change.

Liver:

No focal lesion identified. Within normal limits in parenchymal
echogenicity. Portal vein is patent on color Doppler imaging with
normal direction of blood flow towards the liver.

Other: Unable to position patient decubitus due to neck injury.
Incidental note made of an 11 mm calculus in the lower pole right
kidney.
IMPRESSION: 1. No acute hepatobiliary abnormality.
2. Incidental note made of an 11 mm calculus in the lower pole right
kidney.

## 2020-08-08 MED ORDER — RIVAROXABAN 20 MG PO TABS
20.0000 mg | ORAL_TABLET | Freq: Every day | ORAL | Status: DC
  Filled 2020-08-08 (×2): qty 1

## 2020-08-08 MED ORDER — EZETIMIBE 10 MG PO TABS
10.0000 mg | ORAL_TABLET | Freq: Every day | ORAL | Status: DC
  Administered 2020-08-08 – 2020-08-09 (×2): 10 mg via ORAL
  Filled 2020-08-08 (×2): qty 1

## 2020-08-08 MED ORDER — PROSOURCE PLUS PO LIQD
30.0000 mL | Freq: Two times a day (BID) | ORAL | Status: DC
  Administered 2020-08-08 – 2020-08-09 (×2): 30 mL via ORAL

## 2020-08-08 MED ORDER — ONDANSETRON HCL 4 MG PO TABS: 4.0000 mg | ORAL_TABLET | Freq: Four times a day (QID) | ORAL | Status: DC | PRN

## 2020-08-08 MED ORDER — TRAMADOL HCL 50 MG PO TABS
50.0000 mg | ORAL_TABLET | Freq: Four times a day (QID) | ORAL | Status: DC | PRN
  Administered 2020-08-08 – 2020-08-09 (×2): 50 mg via ORAL
  Filled 2020-08-08 (×2): qty 1

## 2020-08-08 MED ORDER — ACETAMINOPHEN 325 MG PO TABS: 650.0000 mg | ORAL_TABLET | Freq: Four times a day (QID) | ORAL | Status: DC | PRN

## 2020-08-08 MED ORDER — CARVEDILOL 3.125 MG PO TABS
3.1250 mg | ORAL_TABLET | Freq: Two times a day (BID) | ORAL | Status: DC
  Administered 2020-08-08 – 2020-08-09 (×2): 3.125 mg via ORAL
  Filled 2020-08-08 (×2): qty 1

## 2020-08-08 MED ORDER — NITROGLYCERIN 0.4 MG SL SUBL: 0.4000 mg | SUBLINGUAL_TABLET | SUBLINGUAL | Status: DC | PRN

## 2020-08-08 MED ORDER — MORPHINE SULFATE (PF) 2 MG/ML IV SOLN
2.0000 mg | INTRAVENOUS | Status: DC | PRN
  Administered 2020-08-08 (×3): 2 mg via INTRAVENOUS
  Filled 2020-08-08 (×3): qty 1

## 2020-08-08 MED ORDER — ISOSORBIDE MONONITRATE ER 30 MG PO TB24
30.0000 mg | ORAL_TABLET | Freq: Every day | ORAL | Status: DC
  Administered 2020-08-08 – 2020-08-09 (×2): 30 mg via ORAL
  Filled 2020-08-08 (×2): qty 1

## 2020-08-08 MED ORDER — POTASSIUM CHLORIDE 10 MEQ/100ML IV SOLN
10.0000 meq | Freq: Once | INTRAVENOUS | Status: AC
  Administered 2020-08-08: 10 meq via INTRAVENOUS
  Filled 2020-08-08: qty 100

## 2020-08-08 MED ORDER — ADULT MULTIVITAMIN W/MINERALS CH
1.0000 | ORAL_TABLET | Freq: Every day | ORAL | Status: DC
  Administered 2020-08-08 – 2020-08-09 (×2): 1 via ORAL
  Filled 2020-08-08 (×2): qty 1

## 2020-08-08 MED ORDER — SODIUM CHLORIDE 0.9 % IV SOLN: INTRAVENOUS | Status: AC

## 2020-08-08 MED ORDER — BOOST / RESOURCE BREEZE PO LIQD CUSTOM
1.0000 | Freq: Three times a day (TID) | ORAL | Status: DC
  Administered 2020-08-08 – 2020-08-09 (×3): 1 via ORAL

## 2020-08-08 MED ORDER — POTASSIUM CHLORIDE 10 MEQ/100ML IV SOLN
10.0000 meq | INTRAVENOUS | Status: AC
  Administered 2020-08-08 (×2): 10 meq via INTRAVENOUS
  Filled 2020-08-08 (×2): qty 100

## 2020-08-08 MED ORDER — MORPHINE SULFATE (PF) 2 MG/ML IV SOLN
2.0000 mg | Freq: Four times a day (QID) | INTRAVENOUS | Status: DC | PRN
  Administered 2020-08-08 (×2): 2 mg via INTRAVENOUS
  Filled 2020-08-08 (×2): qty 1

## 2020-08-08 MED ORDER — INSULIN ASPART 100 UNIT/ML ~~LOC~~ SOLN
0.0000 [IU] | SUBCUTANEOUS | Status: DC
  Administered 2020-08-08: 3 [IU] via SUBCUTANEOUS
  Administered 2020-08-08: 17:00:00 5 [IU] via SUBCUTANEOUS
  Administered 2020-08-08: 1 [IU] via SUBCUTANEOUS
  Administered 2020-08-08: 7 [IU] via SUBCUTANEOUS
  Administered 2020-08-08 – 2020-08-09 (×3): 2 [IU] via SUBCUTANEOUS
  Administered 2020-08-09: 01:00:00 5 [IU] via SUBCUTANEOUS
  Filled 2020-08-08 (×8): qty 1

## 2020-08-08 MED ORDER — CLOPIDOGREL BISULFATE 75 MG PO TABS
75.0000 mg | ORAL_TABLET | Freq: Every day | ORAL | Status: DC
  Administered 2020-08-08 – 2020-08-09 (×2): 75 mg via ORAL
  Filled 2020-08-08 (×2): qty 1

## 2020-08-08 MED ORDER — ONDANSETRON HCL 4 MG/2ML IJ SOLN
4.0000 mg | Freq: Four times a day (QID) | INTRAMUSCULAR | Status: DC | PRN
  Administered 2020-08-08: 17:00:00 4 mg via INTRAVENOUS
  Filled 2020-08-08: qty 2

## 2020-08-08 MED ORDER — MELATONIN 5 MG PO TABS
5.0000 mg | ORAL_TABLET | Freq: Every day | ORAL | Status: DC
  Administered 2020-08-08: 5 mg via ORAL
  Filled 2020-08-08 (×2): qty 1

## 2020-08-08 MED ORDER — GABAPENTIN 100 MG PO CAPS
100.0000 mg | ORAL_CAPSULE | Freq: Three times a day (TID) | ORAL | Status: DC
  Administered 2020-08-08 – 2020-08-09 (×4): 100 mg via ORAL
  Filled 2020-08-08 (×4): qty 1

## 2020-08-08 MED ORDER — SPIRONOLACTONE 25 MG PO TABS
25.0000 mg | ORAL_TABLET | Freq: Every day | ORAL | Status: DC
  Administered 2020-08-08 – 2020-08-09 (×2): 25 mg via ORAL
  Filled 2020-08-08 (×2): qty 1

## 2020-08-08 NOTE — Progress Notes (Signed)
[facility] NAME: Lenox Bink    MR#:  [id number]  DATE OF BIRTH:  Dec 03, 1945  SUBJECTIVE:  patient came in after having a syncopal episode at home. He tells me he had been vomiting several times and overall felt dehydrated.  Patient has chronic cervical DJD. He is complaining of neck pain. He is [facility]. He had some tingling numbness in his left upper extremity which is improved. He is willing to work with physical therapy and will start him on clear liquid diet.  REVIEW OF SYSTEMS:   Review of Systems  Constitutional: Negative for chills, fever and weight loss.  HENT: Negative for ear discharge, ear pain and nosebleeds.   Eyes: Negative for blurred vision, pain and discharge.  Respiratory: Negative for sputum production, shortness of breath, wheezing and stridor.   Cardiovascular: Negative for chest pain, palpitations, orthopnea and PND.  Gastrointestinal: Positive for abdominal pain. Negative for diarrhea, nausea and vomiting.  Genitourinary: Negative for frequency and urgency.  Musculoskeletal: Positive for back pain, joint pain and neck pain.  Neurological: Positive for weakness. Negative for sensory change, speech change and focal weakness.  Psychiatric/Behavioral: Negative for depression and hallucinations. The patient is not nervous/anxious.    Tolerating Diet: Tolerating PT: rec STR today  DRUG ALLERGIES:   Allergies  Allergen Reactions  . 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 Heparin Other (See Comments)    Reaction:  Bleeding   . Lisinopril Swelling    VITALS:  Blood pressure 116/72, pulse 74, temperature 98.1 F (36.7 C), resp. rate 18, height 6' (1.829 m), weight 77.1 kg, SpO2 98 %.  PHYSICAL EXAMINATION:   Physical Exam  GENERAL:  75 y.o.-year-old patient lying in the bed with mild to mod acute distress due to neck pain HEENT: Head atraumatic, normocephalic. Oropharynx and nasopharynx  clear. Neck muscle strain+ NECK:  Supple, no jugular venous distention. No thyroid enlargement, no tenderness.  LUNGS: Normal breath sounds bilaterally, no wheezing, rales, rhonchi. No use of accessory muscles of respiration.  CARDIOVASCULAR: S1, S2 normal. No murmurs, rubs, or gallops.  ABDOMEN: Soft, nontender, nondistended. Bowel sounds present. No organomegaly or mass.  EXTREMITIES: No cyanosis, clubbing or edema b/l.    NEUROLOGIC: Cranial nerves II through XII are intact. No focal Motor or sensory deficits b/l.  Weak overall PSYCHIATRIC:  patient is alert and oriented x 3.  SKIN: No obvious rash, lesion, or ulcer.   LABORATORY PANEL:  CBC Recent Labs  Lab 08/08/20 0542  WBC 12.1*  HGB 16.0  HCT 46.2  PLT 185    Chemistries  Recent Labs  Lab 08/07/20 1725 08/08/20 0542  NA  --  129*  K  --  4.1  CL  --  101  CO2  --  16*  GLUCOSE  --  168*  BUN  --  24*  CREATININE  --  1.08  CALCIUM  --  8.7*  MG 1.9  --   AST  --  24  ALT  --  24  ALKPHOS  --  62  BILITOT  --  3.1*   Cardiac Enzymes No results for input(s): TROPONINI in the last 168 hours. RADIOLOGY:  CT Head Wo Contrast  Result Date: 08/07/2020 CLINICAL DATA:  syncopal episode at home today. Pt has neck pain headache, back pain. Stent placement 2 weeks ago. Pt on plavix EXAM: CT HEAD WITHOUT CONTRAST CT CERVICAL SPINE WITHOUT CONTRAST TECHNIQUE: Multidetector CT imaging  of the head and cervical spine was performed following the standard protocol without intravenous contrast. Multiplanar CT image reconstructions of the cervical spine were also generated. COMPARISON:  MRI brain July 12, 2020, head CT September 08, 2019 CT neck February 09, 2013 and May 01, 2012. FINDINGS: CT HEAD FINDINGS Brain: Similar age related global parenchymal volume loss. Unchanged mild burden of periventricular and deep white matter chronic microvascular ischemic changes. There is no acute intracranial hemorrhage. No mass effect or  midline shift. No extra-axial fluid collection. Vascular: No hyperdense vessel. Atherosclerotic vascular calcification. Skull: No acute osseous abnormality. Sinuses/Orbits: Unremarkable Other: None CT CERVICAL SPINE FINDINGS Alignment: Straightening of the normal cervical lordosis. No evidence of listhesis. Skull base and vertebrae: No acute fracture. No primary bone lesion or focal pathologic process. Prior posterior decompression with cervical fusion spanning from C5-C7 without evidence of hardware complication. Additionally, there is bony ankylosis across these levels. Soft tissues and spinal canal: No prevertebral fluid or swelling. No visible canal hematoma. Disc levels: Multilevel degenerative changes spine with disc space narrowing, facet/uncovertebral hypertrophy and disc osteophyte complex ease. Upper chest: Biapical scarring. Other: Bilateral carotid artery stents. IMPRESSION: 1. No evidence of acute intracranial abnormality. 2. No evidence of acute fracture or subluxation of the cervical spine. Electronically Signed   By: Dahlia Bailiff MD   On: 08/07/2020 16:22   CT Cervical Spine Wo Contrast  Result Date: 08/07/2020 CLINICAL DATA:  syncopal episode at home today. Pt has neck pain headache, back pain. Stent placement 2 weeks ago. Pt on plavix EXAM: CT HEAD WITHOUT CONTRAST CT CERVICAL SPINE WITHOUT CONTRAST TECHNIQUE: Multidetector CT imaging of the head and cervical spine was performed following the standard protocol without intravenous contrast. Multiplanar CT image reconstructions of the cervical spine were also generated. COMPARISON:  MRI brain July 12, 2020, head CT September 08, 2019 CT neck February 09, 2013 and May 01, 2012. FINDINGS: CT HEAD FINDINGS Brain: Similar age related global parenchymal volume loss. Unchanged mild burden of periventricular and deep white matter chronic microvascular ischemic changes. There is no acute intracranial hemorrhage. No mass effect or midline shift. No  extra-axial fluid collection. Vascular: No hyperdense vessel. Atherosclerotic vascular calcification. Skull: No acute osseous abnormality. Sinuses/Orbits: Unremarkable Other: None CT CERVICAL SPINE FINDINGS Alignment: Straightening of the normal cervical lordosis. No evidence of listhesis. Skull base and vertebrae: No acute fracture. No primary bone lesion or focal pathologic process. Prior posterior decompression with cervical fusion spanning from C5-C7 without evidence of hardware complication. Additionally, there is bony ankylosis across these levels. Soft tissues and spinal canal: No prevertebral fluid or swelling. No visible canal hematoma. Disc levels: Multilevel degenerative changes spine with disc space narrowing, facet/uncovertebral hypertrophy and disc osteophyte complex ease. Upper chest: Biapical scarring. Other: Bilateral carotid artery stents. IMPRESSION: 1. No evidence of acute intracranial abnormality. 2. No evidence of acute fracture or subluxation of the cervical spine. Electronically Signed   By: Dahlia Bailiff MD   On: 08/07/2020 16:22   MR Cervical Spine W or Wo Contrast  Result Date: 08/07/2020 CLINICAL DATA:  Syncope with neck pain EXAM: MRI CERVICAL SPINE WITHOUT AND WITH CONTRAST TECHNIQUE: Multiplanar and multiecho pulse sequences of the cervical spine, to include the craniocervical junction and cervicothoracic junction, were obtained without and with intravenous contrast. CONTRAST:  60mL GADAVIST GADOBUTROL 1 MMOL/ML IV SOLN COMPARISON:  None. FINDINGS: Alignment: Grade 1 anterolisthesis at C3-4 Vertebrae: C5-7 ACDF.  No acute fracture. Cord: Symmetric bilateral hyperintense T2-weighted signal within the spinal cord at  the C4 level. Otherwise normal signal. Posterior Fossa, vertebral arteries, paraspinal tissues: Negative. Disc levels: C1-2: Negative C2-3: Left-greater-than-right facet and uncovertebral hypertrophy. Mild spinal canal stenosis. Severe left neural foraminal stenosis.  C3-4: Small central disc extrusion with components of superior and inferior migration. Mild facet hypertrophy. Mild spinal canal stenosis. Severe bilateral neural foraminal stenosis. C4-5: Small disc bulge with bilateral uncovertebral hypertrophy. There is no spinal canal stenosis. Severe bilateral neural foraminal stenosis. C5-6: Anterior fusion. There is no spinal canal stenosis. Mild bilateral neural foraminal stenosis. C6-7: Anterior fusion. There is no spinal canal stenosis. Mild left neural foraminal stenosis. C7-T1: Normal disc space and facet joints. There is no spinal canal stenosis. No neural foraminal stenosis. IMPRESSION: 1. C5-7 ACDF without spinal canal stenosis. 2. Severe bilateral neural foraminal stenosis at C3-4 and C4-5. 3. Mild spinal canal stenosis at C2-3 and C3-4. 4. Symmetric bilateral hyperintense T2-weighted signal within the spinal cord at the C4 level, consistent with myelomalacia. Electronically Signed   By: Ulyses Jarred M.D.   On: 08/07/2020 22:43   DG Chest Portable 1 View  Result Date: 08/07/2020 CLINICAL DATA:  75 year old male with chest pain. EXAM: PORTABLE CHEST 1 VIEW COMPARISON:  Chest radiograph dated 07/11/2020. FINDINGS: There is diffuse chronic interstitial coarsening and bronchitic changes. No focal consolidation, pleural effusion or pneumothorax. The cardiac silhouette is within limits. Median sternotomy wires and CABG vascular clips. Osteopenia with degenerative changes of the spine. Lower cervical ACDF and partially visualized left humeral fixation hardware. No acute osseous pathology. IMPRESSION: No active disease. Electronically Signed   By: Anner Crete M.D.   On: 08/07/2020 16:34   US Abdomen Limited RUQ (LIVER/GB)  Result Date: 08/08/2020 CLINICAL DATA:  Acute pancreatitis EXAM: ULTRASOUND ABDOMEN LIMITED RIGHT UPPER QUADRANT COMPARISON:  Ultrasound 02/24/2013 FINDINGS: Gallbladder: No gallstones or wall thickening visualized. No sonographic Murphy sign  noted by sonographer. Common bile duct: Diameter: 6.5 mm, within normal limits for senescent change. Liver: No focal lesion identified. Within normal limits in parenchymal echogenicity. Portal vein is patent on color Doppler imaging with normal direction of blood flow towards the liver. Other: Unable to position patient decubitus due to neck injury. Incidental note made of an 11 mm calculus in the lower pole right kidney. IMPRESSION: 1. No acute hepatobiliary abnormality. 2. Incidental note made of an 11 mm calculus in the lower pole right kidney. Electronically Signed   By: Lovena Le M.D.   On: 08/08/2020 01:42   ASSESSMENT AND PLAN:  WALTON DIGILIO is a 75 y.o. male with medical history significant for CAD s/p CABG (recent admission for NSTEMI s/p PCI with DES to [facility] 07/13/2020), PAF on Xarelto, T2DM, HTN, HLD, CAS, cervical spinal stenosis s/p C5-C7 ACDF, chronic pain syndrome who presents to the ED for evaluation after syncopal episode at home.  Patient recently admitted 07/11/2020-07/21/2020 for NSTEMI.  Underwent LHC on 07/13/2020 and found to have multivessel CAD.  PCI with DES to SVG-RCA performed.  He was continued on Plavix and Xarelto on discharge.  Acute pancreatitis, mild -- etiology unclear Likely ongoing the last several days while patient was at home.  Lipase mildly elevated at 116 --down to 36 -start clear liquid -received IV fluid hydration  -IV morphine 2 mg every 6 hours as needed for severe pain with hold parameters. Will add PO tramadol -Antiemetics as needed -RUQ ultrasound-- negative for gallstones  Syncope: Likely orthostatic from hypovolemia due to GI losses.   Continue IV fluid hydration.  Blood pressure stable  AKI:  Mild and likely prerenal from hypovolemia.--resolved recieved IV fluid hydration as above.   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  Severe  bilateral neuroforaminal stenosis at C3-4 and C4-5 noted as well as changes consistent with myelomalacia at the C4 level.  Pain likely worsened in setting of syncope with fall. -Given IV Decadron 10 mg once after ED discussion with on-call neurosurgery -k-pad, gabapentin, patient encouraged physical therapy  CAD s/p CABG and recent NSTEMI s/p PCI with DES to SVG-RCA 07/13/2020: Stable, denies any recurrent chest pain.  Continue Plavix and Xarelto. Resume Coreg PRN Nitro and Imdur  Paroxysmal atrial fibrillation: Stable with rate controlled.  Continue Xarelto.  Coreg resumed  Hypokalemia: repleted K 4.1  Hyponatremia: Mild from hypovolemia and chlorathiazide (d/ced)  Cont to monitor Pt has had low sodium isn the past  T2DM: Holding home Jardiance.  Continue sensitive SSI q4h   HTN: Resumed coreg and imdur  Hyperlipidemia: Atorvastatin and Zetia on hold for now.  Generalized weakness: PT eval.  DVT prophylaxis: Xarelto Code Status: Full code, confirmed with patient Family Communication: tried calling sister earlier--busy line Disposition Plan: From home, dispo pending symptomatic improvement, ability maintain adequate oral intake, PT eval Consults called: None Level of care: Med-Surg Admission status:  Status is: IP   Dispo: The patient is from: Home  Anticipated d/c is to: Home  Anticipated d/c date is: 1 day  Patient currently is not medically stable to d/c.  Patient worked with physical therapy today stump. Hopefully will improve better tomorrow. He has significant DJD cervical. PT recommends rehab will continue to monitor with pain meds and reattempt physical therapy eval tomorrow.      TOTAL TIME TAKING CARE OF THIS PATIENT: *25* minutes.  >50% time spent on counselling and coordination of care  Note: This dictation was prepared with Dragon dictation along with smaller phrase technology. Any transcriptional errors  that result from this process are unintentional.  Fritzi Mandes M.D    [facility]   CC: Primary care physician; [name], MDPatient ID: Ladona Horns, male   DOB: [birth year], 75 y.o.   MRN: [id number]

## 2020-08-08 NOTE — Progress Notes (Signed)
Initial Nutrition Assessment  DOCUMENTATION CODES:   Not applicable  INTERVENTION:   Boost Breeze po TID, each supplement provides 250 kcal and 9 grams of protein  28ml Prosource Plus po BID, each supplement provides 100 kcals and 15 grams of protein  MVI with minerals daily  If/When diet advanced, recommend d/c Boost Breeze and ordering Ensure Enlive po TID, each supplement provides 350 kcal and 20 grams of protein   NUTRITION DIAGNOSIS:   Inadequate oral intake related to nausea,vomiting as evidenced by per patient/family report.    GOAL:   Patient will meet greater than or equal to 90% of their needs    MONITOR:   PO intake,Supplement acceptance,Diet advancement,Weight trends,Labs,I & O's  REASON FOR ASSESSMENT:   Malnutrition Screening Tool    ASSESSMENT:   Pt admitted with acute pancreatitis and syncope. PMH includes CAD s/p CABG (recent admission for NSTEMI s/p PCI with DES to [facility] 07/13/20), PAF, type 2 DM, HTN, HLD, CAS, cervical spine stenosis s/p C5-C7 ACDF, chronic pain syndrome.  Pt reports that he has had vague abdominal pain and decreased po intake since returning home from last admission (discharged 07/21/20). Pt states that he has been having intermittent N/V which has significantly worsened over the last several days. Per pt, he has not been able to sustain any adequate intake and frequency of emesis has increased.   No PO intake documented since admit. Pt on clear liquid diet. Will order appropriate oral nutrition supplements and recommendations for once diet is advanced.   Weight history reviewed. Pt noted to have weighed 81.3kg on 07/21/20 and 77.1 kg upon admission. This would indicate a potential 5% wt loss x <1 month, which would be significant for time frame; however, question accuracy of admission weight as it appears to have been estimated rather than obtained. Recommend obtaining new measured weight to fully assess weight history. Note pt with  orders for daily weights, so will likely be able to better assess weight history at follow-up.  UOP: 746ml x24 hours  Medications: ss novolog Q4H, aldactone Labs: Na 124 (L) CBGs: 128-184   NUTRITION - FOCUSED PHYSICAL EXAM:  RD unable to perform at this time as RD is working remotely. Will attempt at follow-up.   Diet Order:   Diet Order            Diet clear liquid Room service appropriate? Yes; Fluid consistency: Thin  Diet effective now                 EDUCATION NEEDS:   No education needs have been identified at this time  Skin:  Skin Assessment: Reviewed RN Assessment  Last BM:  PTA  Height:   Ht Readings from Last 1 Encounters:  08/08/20 6' (1.829 m)    Weight:   Wt Readings from Last 1 Encounters:  08/07/20 77.1 kg    BMI:  Body mass index is 23.06 kg/m.  Estimated Nutritional Needs:   Kcal:  2200-2400  Protein:  110-125 grams  Fluid:  >2L    Bryan Ina, MS, RD, LDN RD pager number and weekend/on-call pager number located in [place].

## 2020-08-08 NOTE — ED Notes (Signed)
Informed Lorrie, RN of bed assignment.

## 2020-08-08 NOTE — Plan of Care (Signed)
  Problem: Education: Goal: Knowledge of General Education information will improve Description Including pain rating scale, medication(s)/side effects and non-pharmacologic comfort measures Outcome: Progressing   

## 2020-08-08 NOTE — ED Notes (Signed)
Pt awake and alert  Iv meds infusing.

## 2020-08-08 NOTE — Plan of Care (Signed)
  Problem: Education: Goal: Knowledge of General Education information will improve Description: Including pain rating scale, medication(s)/side effects and non-pharmacologic comfort measures 08/08/2020 1624 by Cristela Blue, RN Outcome: Progressing 08/08/2020 1624 by Cristela Blue, RN Outcome: Progressing   Problem: Health Behavior/Discharge Planning: Goal: Ability to manage health-related needs will improve 08/08/2020 1624 by Cristela Blue, RN Outcome: Progressing 08/08/2020 1624 by Cristela Blue, RN Outcome: Progressing   Problem: Clinical Measurements: Goal: Ability to maintain clinical measurements within normal limits will improve 08/08/2020 1624 by Cristela Blue, RN Outcome: Progressing 08/08/2020 1624 by Cristela Blue, RN Outcome: Progressing Goal: Will remain free from infection 08/08/2020 1624 by Cristela Blue, RN Outcome: Progressing 08/08/2020 1624 by Cristela Blue, RN Outcome: Progressing Goal: Diagnostic test results will improve 08/08/2020 1624 by Cristela Blue, RN Outcome: Progressing 08/08/2020 1624 by Cristela Blue, RN Outcome: Progressing Goal: Respiratory complications will improve 08/08/2020 1624 by Cristela Blue, RN Outcome: Progressing 08/08/2020 1624 by Cristela Blue, RN Outcome: Progressing Goal: Cardiovascular complication will be avoided 08/08/2020 1624 by Cristela Blue, RN Outcome: Progressing 08/08/2020 1624 by Cristela Blue, RN Outcome: Progressing   Problem: Activity: Goal: Risk for activity intolerance will decrease 08/08/2020 1624 by Cristela Blue, RN Outcome: Progressing 08/08/2020 1624 by Cristela Blue, RN Outcome: Progressing   Problem: Nutrition: Goal: Adequate nutrition will be maintained 08/08/2020 1624 by Cristela Blue, RN Outcome: Progressing 08/08/2020 1624 by Cristela Blue, RN Outcome: Progressing   Problem: Coping: Goal: Level of anxiety will decrease 08/08/2020 1624 by Cristela Blue, RN Outcome:  Progressing 08/08/2020 1624 by Cristela Blue, RN Outcome: Progressing   Problem: Elimination: Goal: Will not experience complications related to bowel motility 08/08/2020 1624 by Cristela Blue, RN Outcome: Progressing 08/08/2020 1624 by Cristela Blue, RN Outcome: Progressing Goal: Will not experience complications related to urinary retention 08/08/2020 1624 by Cristela Blue, RN Outcome: Progressing 08/08/2020 1624 by Cristela Blue, RN Outcome: Progressing   Problem: Pain Managment: Goal: General experience of comfort will improve 08/08/2020 1624 by Cristela Blue, RN Outcome: Progressing 08/08/2020 1624 by Cristela Blue, RN Outcome: Progressing   Problem: Safety: Goal: Ability to remain free from injury will improve 08/08/2020 1624 by Cristela Blue, RN Outcome: Progressing 08/08/2020 1624 by Cristela Blue, RN Outcome: Progressing   Problem: Skin Integrity: Goal: Risk for impaired skin integrity will decrease 08/08/2020 1624 by Cristela Blue, RN Outcome: Progressing 08/08/2020 1624 by Cristela Blue, RN Outcome: Progressing

## 2020-08-08 NOTE — ED Notes (Signed)
fsbs 128

## 2020-08-08 NOTE — ED Notes (Signed)
Dr patel in with pt.

## 2020-08-08 NOTE — ED Notes (Signed)
Pt awake and alert.  Pt waiting on admission.  nsr on monitor.  No acute distress.

## 2020-08-08 NOTE — Evaluation (Signed)
Physical Therapy Evaluation Patient Details Name: Bryan Scott MRN: [id number] DOB: 1946/01/16 Today's Date: 08/08/2020   History of Present Illness  Patient is a 75 year old male who presents with acute pancreatitis and syncopal episode at home where he stood up to walk to the kitchen and became lightheaded and hit cheek to floor. Since fall has had chronic neck pain and numbness and tingling down bilateral arms. MRI shows C5-7 ACDF w/o spinal canal stenosis with severe bilateral neural foraminal stenosis at C3-4 and C4-5.  PMH significant for CAD s/p CABG, hypertension, paroxysmal A. fib on anticoagulation, type 2 diabetes, hyperlipidemia and history of bladder cancer.  Patient recently admitted for non-STEMI and underwent left heart cath with PCI 1/26-07/21/20. Since returning home has had abdominal pain and decreased oral intake with increase of emesis.  Admitted for management of acute pancreatitis and syncope.  Clinical Impression  Patient resting in bed upon arrival to room; alert and oriented, agreeable to participation with session as pain allows.  Rates pain in neck 7-8/10; very tense and guarded with all positioning and movement attempts.  Tolerates bilat shoulder flexion to approx 70 degrees, bilat elbows and wrists grossly WFL; mild tremors in hands at times, but resolves with intentional movement or relaxation.  Bilat cervical rotation to approx 35-40 degrees bilat; limited by pain.  Educated in postural alignment, relaxation and HEP to facilitate; encouraged performance outside of therapy as tolerated.   Currently requiring min assist for bed mobility; cga/min assist for sit/stand and SPT from bed/chair.  Generally guarded and unsteady, requiring UE support for external stabilization with all movement transitions and transfer attempts. Maintains triple flexed posture in standing, limited ability to correct.  Patient declined participation with additional gait efforts at this time; will continue  to assess/progress next session (and will bring rollator as available). Of note, orthostatics assessed during session; stable and WFL throughout session.  See vitals flowsheet for details. Would benefit from skilled PT to address above deficits and promote optimal return to PLOF; recommend transition to STR upon discharge from acute hospitalization.  Will continue to follow and update recommendations as appropriate; anticipate improved mobility and ability as pain decreases.     Follow Up Recommendations SNF    Equipment Recommendations       Recommendations for Other Services       Precautions / Restrictions Precautions Precautions: Fall Restrictions Weight Bearing Restrictions: No      Mobility  Bed Mobility Overal bed mobility: Needs Assistance Bed Mobility: Supine to Sit     Supine to sit: Min assist     General bed mobility comments: use of elevated HOB, bedrails and min assist from therapist for truncal elevation    Transfers Overall transfer level: Needs assistance   Transfers: Sit to/from Stand;Stand Pivot Transfers Sit to Stand: Min guard Stand pivot transfers: Min guard       General transfer comment: requires UE support throughout for external stabilization, but refuses use of RW at this time  Ambulation/Gait             General Gait Details: declined gait trial at this time due to neck pain  Stairs            Wheelchair Mobility    Modified Rankin (Stroke Patients Only)       Balance Overall balance assessment: Needs assistance Sitting-balance support: No upper extremity supported;Feet supported Sitting balance-Leahy Scale: Good     Standing balance support: Single extremity supported Standing balance-Leahy Scale: Fair  Pertinent Vitals/Pain Pain Assessment: Faces Pain Score: 8  Faces Pain Scale: Hurts whole lot Pain Location: neck Pain Descriptors / Indicators:  Aching;Grimacing;Guarding Pain Intervention(s): Limited activity within patient's tolerance;Monitored during session;Repositioned    Home Living Family/patient expects to be discharged to:: Private residence Living Arrangements: Alone Available Help at Discharge: Family;Available PRN/intermittently Type of Home: House Home Access: Stairs to enter Entrance Stairs-Rails: None Entrance Stairs-Number of Steps: 4 Home Layout: One level Home Equipment: Walker - 4 wheels      Prior Function Level of Independence: Independent with assistive device(s)         Comments: Uses a rollator for functional mobility and endorses falling frequently. Pt reports completing ADLs/IADLs independently. + driving.  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  Very guarded in movement, all joints, all planes.  Endorses generalized tingling from neck to fingertips bilat UEs)    Lower Extremity Assessment Lower Extremity Assessment: Overall WFL for tasks assessed (bilat LE strength grossly 4-/5, unable to achieve full knee extension bilat; endorses history of neuropathy with decreased sensory awareness digits 3-5 bilat)       Communication   Communication: No difficulties  Cognition Arousal/Alertness: Awake/alert Behavior During Therapy: WFL for tasks assessed/performed Overall Cognitive Status: Within Functional Limits for tasks assessed                                        General Comments      Exercises Other Exercises Other Exercises: Educated in role of PT and OOB positioning, progressive mobility; educated in postural alignment and relaxation of cervical/upper thoracic musculature, educated in HEP to promote the same (scapular retraction and  depression, cervical rotation within pain-free range, cervical retraction).  Patient voiced understanding of all information. Other Exercises: Orthostatic assessment with transition to upright; see vitals flowsheets for results.  Vitals stable and WFL throughout session; minimal reports of dizziness noted.   Assessment/Plan    PT Assessment Patient needs continued PT services  PT Problem List Decreased strength;Decreased range of motion;Decreased activity tolerance;Decreased balance;Decreased knowledge of use of DME;Decreased cognition;Decreased mobility;Decreased safety awareness;Cardiopulmonary status limiting activity;Decreased coordination;Decreased knowledge of precautions;Pain       PT Treatment Interventions DME instruction;Gait training;Stair training;Functional mobility training;Therapeutic activities;Patient/family education;Neuromuscular re-education;Balance training;Therapeutic exercise    PT Goals (Current goals can be found in the Care Plan section)  Acute Rehab PT Goals Patient Stated Goal: "to try and see what I can do" PT Goal Formulation: With patient Time For Goal Achievement: 08/22/20 Potential to Achieve Goals: Fair    Frequency Min 2X/week   Barriers to discharge Inaccessible home environment;Decreased caregiver support      Co-evaluation               AM-PAC PT "6 Clicks" Mobility  Outcome Measure Help needed turning from your back to your side while in a flat bed without using bedrails?: A Little Help needed moving from lying on your back to sitting on the side of a flat bed without using bedrails?: A Little Help needed moving to and from a bed to a chair (including a wheelchair)?: A Little Help needed standing up from a chair using your arms (e.g., wheelchair or bedside  chair)?: A Little Help needed to walk in hospital room?: A Little Help needed climbing 3-5 steps with a railing? : A Lot 6 Click Score: 17    End of Session Equipment Utilized  During Treatment: Gait belt Activity Tolerance: Patient tolerated treatment well Patient left: in chair;with call bell/phone within reach;with chair alarm set Nurse Communication: Mobility status PT Visit Diagnosis: Unsteadiness on feet (R26.81);Other abnormalities of gait and mobility (R26.89);Muscle weakness (generalized) (M62.81);History of falling (Z91.81);Difficulty in walking, not elsewhere classified (R26.2);Pain    Time: 1040-1103 PT Time Calculation (min) (ACUTE ONLY): 23 min   Charges:   PT Evaluation $PT Eval Moderate Complexity: 1 Mod PT Treatments $Therapeutic Activity: 8-22 mins       Tylea Hise H. Owens Shark, PT, DPT, NCS 08/08/20, 11:31 AM [phone]

## 2020-08-08 NOTE — Progress Notes (Signed)
PT Cancellation Note  Patient Details Name: Bryan Scott MRN: [id number] DOB: [birth year]   Cancelled Treatment:    Reason Eval/Treat Not Completed: Patient declined, no reason specified;Other (comment) (Patient consult received and reviewed. Upon entering patient room patient refused PT as he states he cannot move and shouldn't move. Attempt at education on need for mobility not received well by patient. Patient declines therapy again after attempt at education but is apologetic. Will attempt again at later time/date as available. )  Janna Arch, PT, DPT   08/08/2020, 8:44 AM

## 2020-08-08 NOTE — Evaluation (Signed)
Occupational Therapy Evaluation Patient Details Name: Bryan Scott MRN: [id number] DOB: 11/04/1945 Today's Date: 08/08/2020    History of Present Illness Patient is a 75 year old male who presents with acute pancreatitis and syncopal episode at home where he stood up to walk to the kitchen and became lightheaded and hit cheek to floor. Since fall has had chronic neck pain and numbness and tingling down bilateral arms. MRI shows C5-7 ACDF w/o spinal canal stenosis with severe bilateral neural foraminal stenosis at C3-4 and C4-5.  PMH significant for CAD s/p CABG, hypertension, paroxysmal A. fib on anticoagulation, type 2 diabetes, hyperlipidemia and history of bladder cancer.  Patient recently admitted for non-STEMI and underwent left heart cath with PCI 1/26-07/21/20. Since returning home has had abdominal pain and decreased oral intake with increase of emesis.  Admitted for management of acute pancreatitis and syncope.   Clinical Impression   Pt seen for OT evaluation this date. Upon arrival to room, pt awake and seated in recliner. Pt A&Ox4 and reporting 8/10 pain in neck; RN informed and plans to obtain heating pads. Since prior admission (1/26-2/5), pt reports he has been mod-independent with rollator for ADLs/IADLs. Pt reports that he has been driving and going to the gym to use the sauna/shower; reports no falls in the community but reports one LOB at home prior to most recent fall. Pt endorsed having blurred vision after fall, however reports that blurriness has resolved since moving from ED to current room; vision assessment completed and appears [facility]. Pt was able to demo/teach-back HEP program provided by PT this AM. This date, pt was agreeable to OT evaluation and seated therex, however deferred ADLs d/t pain. Pt required MIN GUARD for STS transfer; Orthostatics: sitting 112/70 and standing 104/81. Plan to assess ADL performance next session. Pt would benefit from additional skilled OT services to  maximize return to PLOF and minimize risk of future falls, injury, caregiver burden, and readmission. Upon discharge, recommend SNF.    Follow Up Recommendations  SNF    Equipment Recommendations  Other (comment) (defer to next venue of care)       Precautions / Restrictions Precautions Precautions: Fall Restrictions Weight Bearing Restrictions: No      Mobility Bed Mobility               General bed mobility comments: not assessed, pt in recliner upon arrival    Transfers Overall transfer level: Needs assistance Equipment used: None Transfers: Sit to/from Stand Sit to Stand: Min guard              Balance           Standing balance support: No upper extremity supported;During functional activity Standing balance-Leahy Scale: Fair Standing balance comment: Standing for ~1 min, with pt requesting to sit following BP reading                           ADL either performed or assessed with clinical judgement   ADL                                         General ADL Comments: pt deferred participation in ADLs this date d/t pain     Vision Baseline Vision/History: Wears glasses Wears Glasses: Reading only Patient Visual Report: Blurring of vision Vision Assessment?: Yes Eye Alignment: Within Functional Limits Ocular  Range of Motion: Within Functional Limits Alignment/Gaze Preference: Within Defined Limits Tracking/Visual Pursuits: Able to track stimulus in all quads without difficulty Saccades: Within functional limits Visual Fields: No apparent deficits Additional Comments: Pt endorses blurred vision since fall, however reports that blurriness has resolved since move from ED to current room. Pt reports that at baseline, he experiences dizziness upon standing            Pertinent Vitals/Pain Pain Assessment: 0-10 Pain Score: 8  Pain Location: neck Pain Descriptors / Indicators: Sharp Pain Intervention(s): Limited  activity within patient's tolerance;Monitored during session;Repositioned     Hand Dominance Right   Extremity/Trunk Assessment Upper Extremity Assessment Upper Extremity Assessment: Generalized weakness (grossly 3-/5 throughout; bilat shoulder flexion approx 70 degrees, limited by pain. Very guarded in movement, all joints, all planes. Endorses generalized tingling from neck to fingertips bilat UEs)   Lower Extremity Assessment Lower Extremity Assessment: Defer to PT evaluation       Communication Communication Communication: No difficulties   Cognition Arousal/Alertness: Awake/alert Behavior During Therapy: WFL for tasks assessed/performed Overall Cognitive Status: Within Functional Limits for tasks assessed                                 General Comments: Pt agreeable to eval this PM. Pt reports that he was utilizing safety measures and energy conservation strategies learned during last admission and that he thinks his fall was related to dehydration.   General Comments  Pt has bruises on b/l hands, L knee, and head. Orthostatics: Sitting 112/70 and Standing 104/81    Exercises Other Exercises Other Exercises: Educated in role of OT and POC. Reviewed HEP discussed in PT (scapular retraction and depression, cervical rotation within pain-free range, cervical retraction).  Patient able to teach-back information Other Exercises: Orthostatic assessment with transition to upright; vitals stable and WFL throughout session; minimal reports of dizziness reported.        Home Living Family/patient expects to be discharged to:: Private residence Living Arrangements: Alone Available Help at Discharge: Family;Available PRN/intermittently (sisters) Type of Home: House Home Access: Stairs to enter [employer] of Steps: 4 in back, 8 in front   Home Layout: One level     Bathroom Shower/Tub: Walk-in shower (Pt reports it is not wide enough for a shower chair)          Home Equipment: Walker - 4 wheels;Grab bars - tub/shower          Prior Functioning/Environment Level of Independence: Independent with assistive device(s)        Comments: Since prior admission (1/26-2/5), pt has been mod-independent with rollator for ADLs/IADLs. Pt reports that he has been driving and going to the gym to use the sauna/shower; reports no falls in the community but reports one LOB at home prior to most recent fall. 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        OT Problem List: Decreased strength;Decreased activity tolerance;Impaired balance (sitting and/or standing);Decreased safety awareness;Impaired sensation      OT Treatment/Interventions: Self-care/ADL training;Therapeutic exercise;Energy conservation;DME and/or AE instruction;Therapeutic activities;Patient/family education;Balance training    OT Goals(Current goals can be found in the care plan section) Acute Rehab OT Goals Patient Stated Goal: "to leave better than I came" OT Goal Formulation: With patient Time For Goal Achievement: 08/22/20 Potential to Achieve Goals: Good ADL Goals Pt Will Perform Grooming: with supervision;standing Pt Will Perform Lower Body Bathing: with supervision;sit to/from stand  Pt Will Transfer to Toilet: with supervision;ambulating;regular height toilet  OT Frequency: Min 1X/week    AM-PAC OT "6 Clicks" Daily Activity     Outcome Measure Help from another person eating meals?: None Help from another person taking care of personal grooming?: A Little Help from another person toileting, which includes using toliet, bedpan, or urinal?: A Little Help from another person bathing (including washing, rinsing, drying)?: A Little Help from another person to put on and taking off regular upper body clothing?: A Little Help from another person to put on and taking off regular lower body clothing?: A Lot 6 Click Score: 18   End of Session Nurse Communication: Mobility  status;Other (comment) (neck pain & request for heating pad)  Activity Tolerance: Patient limited by pain Patient left: in chair;with call bell/phone within reach;with chair alarm set  OT Visit Diagnosis: Unsteadiness on feet (R26.81);History of falling (Z91.81);Muscle weakness (generalized) (M62.81)                Time: 1610-9604 OT Time Calculation (min): 34 min Charges:  OT General Charges $OT Visit: 1 Visit OT Evaluation $OT Eval Moderate Complexity: 1 Mod OT Treatments $Therapeutic Activity: 8-22 mins  Fredirick Maudlin, OTR/L [place]

## 2020-08-08 NOTE — Progress Notes (Signed)
Inpatient Diabetes Program Recommendations  AACE/ADA: New Consensus Statement on Inpatient Glycemic Control  Target Ranges:  Prepandial:   less than 140 mg/dL      Peak postprandial:   less than 180 mg/dL (1-2 hours)      Critically ill patients:  140 - 180 mg/dL   Results for DAMERE, BRANDENBURG (MRN [id number]) as of 08/08/2020 13:22  Ref. Range 08/08/2020 02:13 08/08/2020 08:12 08/08/2020 12:47  Glucose-Capillary Latest Ref Range: 70 - 99 mg/dL 128 (H) 184 (H) 345 (H)   Review of Glycemic Control  Diabetes history: DM2 Outpatient Diabetes medications: Jardiance 25 mg daily, Glipizide 10 mg BID, Glumetza 500 mg BID Current orders for Inpatient glycemic control: Novolog 0-9 units Q4H  Inpatient Diabetes Program Recommendations:    Insulin: Please consider ordering Lantus 10 units Q24H.  NOTE: Noted patient received Decadron 10 mg x 1 on 08/07/20.  No other steroids ordered at this time. Glucose up to 345 mg/dl at 12:47 today.   Thanks, Barnie Alderman, RN, MSN, CDE Diabetes Coordinator Inpatient Diabetes Program [phone] (Team Pager from 8am to 5pm)

## 2020-08-09 DIAGNOSIS — M5412 Radiculopathy, cervical region: Secondary | ICD-10-CM | POA: Diagnosis not present

## 2020-08-09 DIAGNOSIS — K859 Acute pancreatitis without necrosis or infection, unspecified: Secondary | ICD-10-CM | POA: Diagnosis not present

## 2020-08-09 DIAGNOSIS — N179 Acute kidney failure, unspecified: Secondary | ICD-10-CM

## 2020-08-09 DIAGNOSIS — I2510 Atherosclerotic heart disease of native coronary artery without angina pectoris: Secondary | ICD-10-CM

## 2020-08-09 LAB — GLUCOSE, CAPILLARY
Glucose-Capillary: 271 mg/dL — ABNORMAL HIGH (ref 70–99)
Glucose-Capillary: 157 mg/dL — ABNORMAL HIGH (ref 70–99)
Glucose-Capillary: 158 mg/dL — ABNORMAL HIGH (ref 70–99)
Glucose-Capillary: 204 mg/dL — ABNORMAL HIGH (ref 70–99)

## 2020-08-09 MED ORDER — ADULT MULTIVITAMIN W/MINERALS CH: 1.0000 | ORAL_TABLET | Freq: Every day | ORAL | 0 refills | Status: AC

## 2020-08-09 MED ORDER — PROSOURCE PLUS PO LIQD: 30.0000 mL | Freq: Two times a day (BID) | ORAL | 0 refills | Status: DC

## 2020-08-09 NOTE — Discharge Instructions (Signed)
Keep log of sugars at home Keep log of BP  At home. A few of your BP meds have been held. D/w your PCP on next visit.

## 2020-08-09 NOTE — Progress Notes (Signed)
Physical Therapy Treatment Patient Details Name: Bryan Scott MRN: [id number] DOB: 1946/02/23 Today's Date: 08/09/2020    History of Present Illness Patient is a 75 year old male who presents with acute pancreatitis and syncopal episode at home where he stood up to walk to the kitchen and became lightheaded and hit cheek to floor. Since fall has had chronic neck pain and numbness and tingling down bilateral arms. MRI shows C5-7 ACDF w/o spinal canal stenosis with severe bilateral neural foraminal stenosis at C3-4 and C4-5.  PMH significant for CAD s/p CABG, hypertension, paroxysmal A. fib on anticoagulation, type 2 diabetes, hyperlipidemia and history of bladder cancer.  Patient recently admitted for non-STEMI and underwent left heart cath with PCI 1/26-07/21/20. Since returning home has had abdominal pain and decreased oral intake with increase of emesis.  Admitted for management of acute pancreatitis and syncope.    PT Comments    Noted improvement in pain rating, relaxation and movement of cervical/upper thoracic musculature and overall mobility/functional performance this date.  Able to complete bed mobility with mod indep; sit/stand, basic transfers and gait (50') with 4WRW, cga/close sup.   Demonstrates very short, shuffling steps; decreased cadence and overall gait speed.  Min cuing for position of 4WRW (tends to maintain too close to body) to allow for increased step length, optimal balance/support.  Increased sway/decreased balance reaction in A/P plane; dependant on 4WRW and LE step strategy for recovery.  Progressing well towards goals; discharge recommendations updated as result.  MD informed/aware.  Did encourage patient and nursing staff to facilitate ambulation to/from bathroom in room for toileting needs to maximize opportunity/repetition of mobility trials; mobility specialist to come on board with case as well to further supplement.   Follow Up Recommendations  Home health PT      Equipment Recommendations       Recommendations for Other Services       Precautions / Restrictions Precautions Precautions: Fall Restrictions Weight Bearing Restrictions: No    Mobility  Bed Mobility Overal bed mobility: Modified Independent Bed Mobility: Supine to Sit     Supine to sit: Modified independent (Device/Increase time)     General bed mobility comments: improved ability to actively utilize bilat UEs to assist with movement transition    Transfers Overall transfer level: Needs assistance Equipment used: 4-wheeled walker Transfers: Sit to/from Stand Sit to Stand: Supervision         General transfer comment: requires multiple attempts and use of momentum to complete, but insistent on doing it "on my own"; does rely on UEs on RW to assist with lift off  Ambulation/Gait Ambulation/Gait assistance: Min guard;Supervision Gait Distance (Feet): 50 Feet Assistive device: 4-wheeled walker       General Gait Details: very short, shuffling steps; decreased cadence and overall gait speed.  Min cuing for position of 4WRW (tends to maintain too close to body) to allow for increased step length, optimal balance/support.  Increased sway/decreased balance reaction in A/P plane; dependant on 4WRW and LE step strategy for recovery.   Stairs             Wheelchair Mobility    Modified Rankin (Stroke Patients Only)       Balance Overall balance assessment: Needs assistance Sitting-balance support: No upper extremity supported;Feet supported       Standing balance support: Bilateral upper extremity supported Standing balance-Leahy Scale: Fair  Cognition Arousal/Alertness: Awake/alert Behavior During Therapy: WFL for tasks assessed/performed Overall Cognitive Status: Within Functional Limits for tasks assessed                                        Exercises      General Comments         Pertinent Vitals/Pain Pain Assessment: Faces Faces Pain Scale: Hurts even more Pain Location: neck Pain Descriptors / Indicators: Aching;Grimacing;Guarding Pain Intervention(s): Limited activity within patient's tolerance;Monitored during session;Repositioned    Home Living                      Prior Function            PT Goals (current goals can now be found in the care plan section) Acute Rehab PT Goals Patient Stated Goal: "to leave better than I came" PT Goal Formulation: With patient Time For Goal Achievement: 08/22/20 Potential to Achieve Goals: Fair Progress towards PT goals: Progressing toward goals    Frequency    Min 2X/week      PT Plan Discharge plan needs to be updated    Co-evaluation              AM-PAC PT "6 Clicks" Mobility   Outcome Measure  Help needed turning from your back to your side while in a flat bed without using bedrails?: None Help needed moving from lying on your back to sitting on the side of a flat bed without using bedrails?: None Help needed moving to and from a bed to a chair (including a wheelchair)?: A Little Help needed standing up from a chair using your arms (e.g., wheelchair or bedside chair)?: A Little Help needed to walk in hospital room?: A Little Help needed climbing 3-5 steps with a railing? : A Little 6 Click Score: 20    End of Session Equipment Utilized During Treatment: Gait belt Activity Tolerance: Patient tolerated treatment well Patient left: in chair;with call bell/phone within reach;with chair alarm set Nurse Communication: Mobility status PT Visit Diagnosis: Unsteadiness on feet (R26.81);Other abnormalities of gait and mobility (R26.89);Muscle weakness (generalized) (M62.81);History of falling (Z91.81);Difficulty in walking, not elsewhere classified (R26.2);Pain     Time: 5852-7782 PT Time Calculation (min) (ACUTE ONLY): 15 min  Charges:  $Gait Training: 8-22 mins                      Lyndsi Altic H. Owens Shark, PT, DPT, NCS 08/09/20, 9:08 AM [phone]

## 2020-08-09 NOTE — Progress Notes (Signed)
Mobility Specialist - Progress Note   08/09/20 1100  Mobility  Activity Ambulated in hall  Level of Assistance Contact guard assist, steadying assist  Assistive Device Four wheel walker  Distance Ambulated (ft) 70 ft  Mobility Response Tolerated well  Mobility performed by Mobility specialist  $Mobility charge 1 Mobility    Pre-mobility: 71 HR, 98% SpO2 During mobility: 74 HR, 97% SpO2 Post-mobility: 70 HR, 98% SpO2   Pt was sitting in recliner upon arrival utilizing room air. RN present at entry and encouraged pt participation. Pt agreed to session. Pt stood to 4WRW independently. Pt ambulated 44' in room/hallway with CGA. No LOB noted. Min VC needed for pt to keep 4WRW safe distance from body. Pt denied SOB. O2 maintained high 90s throughout activity. One standing rest break taken as pt c/o feeling weak in LE. Pt able to self-determine stopping point for ambulation tolerance. Overall, pt tolerated session well. Pt was left EOB with all needs in reach, anticipating d/c.    Kathee Delton Mobility Specialist 08/09/20, 11:33 AM

## 2020-08-09 NOTE — Progress Notes (Signed)
Patient verbalized understanding of all discharge instructions including follow up appointments. Pt did say he would likely cancel f/u with PCP, as he has appt at [facility] scheduled for March 6th and felt they had a better understanding of all his medical problems.

## 2020-08-09 NOTE — TOC Initial Note (Signed)
Transition of Care [facility]) - Initial/Assessment Note    Patient Details  Name: Bryan Scott MRN: [id number] Date of Birth: Sep 04, 1945  Transition of Care [facility]) CM/SW Contact:    Magnus Ivan, LCSW Phone Number: 08/09/2020, 10:09 AM  Clinical Narrative:              CSW met with patient at bedside. Patient lives alone. Sister provides transportation. Patient reported he sees Dr. Lovie Macadamia as well as Kathalene Frames for PCP. Patient uses [employer] and [employer]. Patient has a RW at home. Patient went to [facility] in the past. Patient agreeable to [facility] recommendation, no agency preference, used [facility] in the past. [place] with [facility] accepted patient for PT, OT, and RN services. Patient reported his sister will pick him up at time of discharge.  Expected Discharge Plan: [place] Barriers to Discharge: Continued Medical Work up   Patient Goals and CMS Choice Patient states their goals for this hospitalization and ongoing recovery are:: home with home health CMS Medicare.gov Compare Post Acute Care list provided to:: Patient Choice offered to / list presented to : Patient  Expected Discharge Plan and Services Expected Discharge Plan: [place]       Living arrangements for the past 2 months: [place] Arranged: PT,OT,RN [place]: [facility] Date [facility] Agency Contacted: 08/09/20   Representative spoke with at [place]: Jana Half  Prior Living Arrangements/Services Living arrangements for the past 2 months: [employer] Lives with:: Self Patient language and need for interpreter reviewed:: Yes Do you feel safe going back to the place where you live?: Yes      Need for Family Participation in Patient Care: Yes (Comment) Care giver support system in place?: Yes (comment) Current home services: DME Criminal Activity/Legal Involvement Pertinent to Current  Situation/Hospitalization: No - Comment as needed  Activities of Daily Living Home Assistive Devices/Equipment: Walker (specify type) (front wheel rolling) ADL Screening (condition at time of admission) Patient's cognitive ability adequate to safely complete daily activities?: Yes Is the patient deaf or have difficulty hearing?: No Does the patient have difficulty seeing, even when wearing glasses/contacts?: Yes Does the patient have difficulty concentrating, remembering, or making decisions?: No Patient able to express need for assistance with ADLs?: No Does the patient have difficulty dressing or bathing?: No Independently performs ADLs?: Yes (appropriate for developmental age) Does the patient have difficulty walking or climbing stairs?: Yes Weakness of Legs: Both Weakness of Arms/Hands: Both  Permission Sought/Granted Permission sought to share information with : Chartered certified accountant granted to share information with : Yes, Verbal Permission Granted     Permission granted to share info w AGENCY: [place] agencies        Emotional Assessment       Orientation: : Oriented to Self,Oriented to Place,Oriented to  Time,Oriented to Situation Alcohol / Substance Use: Not Applicable Psych Involvement: No (comment)  Admission diagnosis:  Acute pancreatitis [K85.90] Cervical radiculopathy [M54.12] Syncope, unspecified syncope type [R55] Acute pancreatitis, unspecified complication status, unspecified pancreatitis type [K85.90] Patient Active Problem List   Diagnosis Date Noted  . Acute pancreatitis 08/07/2020  . Hyperlipidemia associated with type 2 diabetes mellitus ([place]) 08/07/2020  . Syncope 08/07/2020  . NSTEMI (non-ST elevated myocardial infarction) ([place])  07/11/2020  . AKI (acute kidney injury) ([place]) 07/11/2020  . Acute metabolic encephalopathy 21/74/7159  . GERD (gastroesophageal reflux disease) 09/09/2019  . CAD (coronary artery disease) 09/09/2019  .  Chronic pain syndrome 09/09/2019  . Leukocytosis 09/09/2019  . Shortness of breath   . Hyponatremia 09/26/2016  . Chest pain 09/22/2016  . 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  . Abnormal EKG 09/21/2016  . Chronic pain 09/21/2016  . PAF (paroxysmal atrial fibrillation) ([place]) 09/21/2016  . Hypertension associated with diabetes ([place]) 05/02/2016  . Carotid stenosis 05/02/2016  . CAD in native artery   . Atypical chest pain   . Type 2 diabetes mellitus without complication ([place])   . Hyperlipidemia   . Pain of left upper extremity   . Angina pectoris ([place])   . Abdominal pain   . Chest pain, central 03/13/2015  . Hypertensive urgency 03/13/2015  . Spinal stenosis 01/10/2012   PCP:  [name], MD Pharmacy:   [place], [place] - [address] [place] [address] Phone: [phone] Fax: [phone]     Social Determinants of Health (SDOH) Interventions    Readmission Risk Interventions No flowsheet data found.

## 2020-08-09 NOTE — Care Management Obs Status (Signed)
[place] NOTIFICATION   Patient Details  Name: Bryan Scott MRN: [id number] Date of Birth: 1945/12/29   Medicare Observation Status Notification Given:  Yes  Reviewed notification with patient at bedside and gave copy to patient. Patient elected for CSW to sign for him.  [place], LCSW 08/09/2020, 10:04 AM

## 2020-08-09 NOTE — Discharge Summary (Signed)
[facility] NAME: Bryan Scott    MR#:  [id number]  DATE OF BIRTH:  [birth year]  DATE OF ADMISSION:  08/07/2020 ADMITTING PHYSICIAN: Lenore Cordia, MD  DATE OF DISCHARGE: 08/09/2020  PRIMARY CARE PHYSICIAN: [name], MD    ADMISSION DIAGNOSIS:  Acute pancreatitis [K85.90] Cervical radiculopathy [M54.12] Syncope, unspecified syncope type [R55] Acute pancreatitis, unspecified complication status, unspecified pancreatitis type [K85.90]  DISCHARGE DIAGNOSIS:  Acute mild pancreatitis--resolved Nausea/vomiting--resolved  SECONDARY DIAGNOSIS:   Past Medical History:  Diagnosis Date  . 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 Cancer ([place])   . Carotid artery disease (HCC)    > 75% bilateral ICA stenoses on 01/2013 CT  . Chronic pain syndrome   . Coronary artery disease    s/p CABG ~ 2007  . GERD (gastroesophageal reflux disease)   . Heparin allergy    Bleeding  . History of tobacco use   . Hyperlipidemia   . Hypertension   . Ischemic cardiomyopathy   . Paroxysmal atrial fibrillation (HCC)    On Xarelto anticoagulation  . Type 2 diabetes mellitus (HCC)    A1C 6.8% in 01/2013    HOSPITAL COURSE:  Bryan Scott a 75 y.o.malewith medical history significant forCAD s/p CABG (recent admission for NSTEMI s/p PCI with DES to [facility] 07/13/2020), PAF on Xarelto, T2DM, HTN, HLD, CAS, cervical spinal stenosis s/p C5-C7 ACDF, chronic pain syndrome who presents to the ED for evaluation after syncopal episode at home.  Patient recently admitted 07/11/2020-07/21/2020 for NSTEMI. Underwent LHC on 07/13/2020 and found to have multivessel CAD. PCI with DES to SVG-RCA performed. He was continued on Plavix and Xarelto on discharge.  Acute pancreatitis, mild -- etiology unclear Likely ongoing the last few days while patient was at home. Lipase mildly elevated at 116 --down to 36 - clear liquid--tolerating po soft diet  today -received IV fluids for hydration  --Antiemetics as needed -RUQ ultrasound-- negative for gallstones  Syncope: Likely orthostatic from hypovolemia due to GI losses.  recieved IV fluid hydration.  Blood pressure stable--soft (held amlodipine and hydralazine), d/ced chlorthalidone due to low sodium and pancreatitis  AKI: Mild and likely prerenal from hypovolemia.--resolved recieved IV fluid hydration as above.   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 Severe bilateral neuroforaminal stenosis at C3-4 and C4-5 noted as well as changes consistent with myelomalacia at the C4 level.Pain likely worsened in setting of syncope with fall. -Given IV Decadron 10 mg once after ED discussion with on-call neurosurgery -k-pad, gabapentin, patient encouraged physical therapy--recommends HHPT -no narcotic rx given--per pt he is mainly taking tylenol at home  CAD s/p CABG and recent NSTEMI s/p PCI with DES to SVG-RCA 07/13/2020: Stable, denies any recurrent chest pain. Continue Plavix. Pt reports he has been taken off Xarleto Resume Coreg PRN Nitro and Imdur  Paroxysmal atrial fibrillation: Stable with rate controlled. Continue Xarelto. Coreg resumed  Hypokalemia: repleted K 4.1  Hyponatremia: Mild from hypovolemia and chlorathalidone (d/ced) Cont to monitor Pt has had low sodium isn the past  T2DM: Resuming po home diabetic meds--pt to keep log of sugars and take meds accordingly.  HTN: Resumed coreg and imdur  Hyperlipidemia: Atorvastatin and Zetia    DVT prophylaxis:plavix, ambulation Code Status:Full code, confirmed with patient Family Communication:spoke with Clydene Pugh on 2/23 Disposition Plan:home with HHPT Consults called:None Level of care:Med-Surg Admission status: Status  is: IP   Dispo: The patient is  from:Home Anticipated d/c is QM:GQQP Anticipated d/c date is: today Patient currently is  medically best optimized to d/c.  Pt in agreement with plan. F/u dr Lovie Macadamia as out pt  CONSULTS OBTAINED:    DRUG ALLERGIES:   Allergies  Allergen Reactions  . 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 Heparin Other (See Comments)    Reaction:  Bleeding   . Lisinopril Swelling    DISCHARGE MEDICATIONS:   Allergies as of 08/09/2020      Reactions   Aspergum [aspirin] Anaphylaxis   Heparin Other (See Comments)   Reaction:  Bleeding    Lisinopril Swelling      Medication List    STOP taking these medications   amLODipine 10 MG tablet Commonly known as: NORVASC   chlorthalidone 25 MG tablet Commonly known as: HYGROTON   hydrALAZINE 25 MG tablet Commonly known as: APRESOLINE   rivaroxaban 20 MG Tabs tablet Commonly known as: XARELTO     TAKE these medications   (feeding supplement) PROSource Plus liquid Take 30 mLs by mouth 2 (two) times daily between meals.   atorvastatin 80 MG tablet Commonly known as: LIPITOR Take 80 mg by mouth daily.   carvedilol 3.125 MG tablet Commonly known as: COREG Take 1 tablet (3.125 mg total) by mouth 2 (two) times daily with a meal.   clopidogrel 75 MG tablet Commonly known as: PLAVIX Take 1 tablet (75 mg total) by mouth daily with breakfast.   ezetimibe 10 MG tablet Commonly known as: ZETIA Take 1 tablet (10 mg total) by mouth daily.   gabapentin 100 MG capsule Commonly known as: NEURONTIN Take 100 mg by mouth 3 (three) times daily.   glipiZIDE 10 MG tablet Commonly known as: GLUCOTROL Take 10 mg by mouth 2 (two) times daily before a meal.   guaiFENesin-dextromethorphan 100-10 MG/5ML syrup Commonly known as: ROBITUSSIN DM Take 5 mLs by mouth every 4 (four) hours as needed for cough.   isosorbide mononitrate 30 MG 24 hr tablet Commonly known as: IMDUR Take 1 tablet (30 mg total) by mouth  daily.   Jardiance 25 MG Tabs tablet Generic drug: empagliflozin Take 25 mg by mouth daily.   metFORMIN 500 MG (MOD) 24 hr tablet Commonly known as: GLUMETZA Take 500 mg by mouth 2 (two) times daily with a meal.   morphine 15 MG 12 hr tablet Commonly known as: MS CONTIN Take 15 mg by mouth every 12 (twelve) hours as needed for pain.   multivitamin with minerals Tabs tablet Take 1 tablet by mouth daily. Start taking on: August 10, 2020   nitroGLYCERIN 0.4 MG SL tablet Commonly known as: NITROSTAT Place 1 tablet (0.4 mg total) under the tongue every 5 (five) minutes as needed for chest pain.   spironolactone 25 MG tablet Commonly known as: ALDACTONE Take 1 tablet (25 mg total) by mouth daily.       If you experience worsening of your admission symptoms, develop shortness of breath, life threatening emergency, suicidal or homicidal thoughts you must seek medical attention immediately by calling 911 or calling your MD immediately  if symptoms less severe.  You Must read complete instructions/literature along with all the possible adverse reactions/side effects for all the Medicines you take and that have been prescribed to you. Take any new Medicines after you have completely understood and accept all the possible adverse reactions/side effects.   Please note  You were cared for by a hospitalist during your hospital  stay. If you have any questions about your discharge medications or the care you received while you were in the hospital after you are discharged, you can call the unit and asked to speak with the hospitalist on call if the hospitalist that took care of you is not available. Once you are discharged, your primary care physician will handle any further medical issues. Please note that NO REFILLS for any discharge medications will be authorized once you are discharged, as it is imperative that you return to your primary care physician (or establish a relationship with a  primary care physician if you do not have one) for your aftercare needs so that they can reassess your need for medications and monitor your lab values. Today   SUBJECTIVE   Out int he chair Ate bowl of oatmeal and some pancake. No vomiting   VITAL SIGNS:  Blood pressure 111/68, pulse 60, temperature 98.4 F (36.9 C), temperature source Oral, resp. rate 16, height 6' (1.829 m), weight 82.6 kg, SpO2 99 %.  I/O:    Intake/Output Summary (Last 24 hours) at 08/09/2020 1037 Last data filed at 08/08/2020 2211 Gross per 24 hour  Intake 980 ml  Output 350 ml  Net 630 ml    PHYSICAL EXAMINATION:  GENERAL:  75 y.o.-year-old patient lying in the bed with no acute distress.  LUNGS: Normal breath sounds bilaterally, no wheezing, rales,rhonchi or crepitation. No use of accessory muscles of respiration.  CARDIOVASCULAR: S1, S2 normal. No murmurs, rubs, or gallops.  ABDOMEN: Soft, non-tender, non-distended. Bowel sounds present. No organomegaly or mass.  EXTREMITIES: No pedal edema, cyanosis, or clubbing. DJD+ NEUROLOGIC: Cranial nerves II through XII are intact. Muscle strength 5/5 in all extremities. Sensation intact. Gait not checked.  PSYCHIATRIC: The patient is alert and oriented x 3.  SKIN: No obvious rash, lesion, or ulcer.   DATA REVIEW:   CBC  Recent Labs  Lab 08/08/20 0542  WBC 12.1*  HGB 16.0  HCT 46.2  PLT 185    Chemistries  Recent Labs  Lab 08/07/20 1725 08/08/20 0542  NA  --  129*  K  --  4.1  CL  --  101  CO2  --  16*  GLUCOSE  --  168*  BUN  --  24*  CREATININE  --  1.08  CALCIUM  --  8.7*  MG 1.9  --   AST  --  24  ALT  --  24  ALKPHOS  --  62  BILITOT  --  3.1*    Microbiology Results   Recent Results (from the past 240 hour(s))  Resp Panel by RT-PCR (Flu A&B, Covid) Nasopharyngeal Swab     Status: None   Collection Time: 08/07/20  3:33 PM   Specimen: Nasopharyngeal Swab; Nasopharyngeal(NP) swabs in vial transport medium  Result Value Ref Range  Status   SARS Coronavirus 2 by RT PCR NEGATIVE NEGATIVE Final    Comment: (NOTE) SARS-CoV-2 target nucleic acids are NOT DETECTED.  The SARS-CoV-2 RNA is generally detectable in upper respiratory specimens during the acute phase of infection. The lowest concentration of SARS-CoV-2 viral copies this assay can detect is 138 copies/mL. A negative result does not preclude SARS-Cov-2 infection and should not be used as the sole basis for treatment or other patient management decisions. A negative result may occur with  improper specimen collection/handling, submission of specimen other than nasopharyngeal swab, presence of viral mutation(s) within the areas targeted by this assay, and inadequate number of viral  copies(<138 copies/mL). A negative result must be combined with clinical observations, patient history, and epidemiological information. The expected result is Negative.  Fact Sheet for Patients:  EntrepreneurPulse.com.au  Fact Sheet for Healthcare Providers:  IncredibleEmployment.be  This test is no t yet approved or cleared by the [place] FDA and  has been authorized for detection and/or diagnosis of SARS-CoV-2 by FDA under an Emergency Use Authorization (EUA). This EUA will remain  in effect (meaning this test can be used) for the duration of the COVID-19 declaration under Section 564(b)(1) of the Act, 21 U.S.C.section 360bbb-3(b)(1), unless the authorization is terminated  or revoked sooner.       Influenza A by PCR NEGATIVE NEGATIVE Final   Influenza B by PCR NEGATIVE NEGATIVE Final    Comment: (NOTE) The Xpert Xpress SARS-CoV-2/FLU/RSV plus assay is intended as an aid in the diagnosis of influenza from Nasopharyngeal swab specimens and should not be used as a sole basis for treatment. Nasal washings and aspirates are unacceptable for Xpert Xpress SARS-CoV-2/FLU/RSV testing.  Fact Sheet for  Patients: EntrepreneurPulse.com.au  Fact Sheet for Healthcare Providers: IncredibleEmployment.be  This test is not yet approved or cleared by the [place] FDA and has been authorized for detection and/or diagnosis of SARS-CoV-2 by FDA under an Emergency Use Authorization (EUA). This EUA will remain in effect (meaning this test can be used) for the duration of the COVID-19 declaration under Section 564(b)(1) of the Act, 21 U.S.C. section 360bbb-3(b)(1), unless the authorization is terminated or revoked.  Performed at [facility], [address], [place], [place] [address]     RADIOLOGY:  CT Head Wo Contrast  Result Date: 08/07/2020 CLINICAL DATA:  syncopal episode at home today. Pt has neck pain headache, back pain. Stent placement 2 weeks ago. Pt on plavix EXAM: CT HEAD WITHOUT CONTRAST CT CERVICAL SPINE WITHOUT CONTRAST TECHNIQUE: Multidetector CT imaging of the head and cervical spine was performed following the standard protocol without intravenous contrast. Multiplanar CT image reconstructions of the cervical spine were also generated. COMPARISON:  MRI brain July 12, 2020, head CT September 08, 2019 CT neck February 09, 2013 and May 01, 2012. FINDINGS: CT HEAD FINDINGS Brain: Similar age related global parenchymal volume loss. Unchanged mild burden of periventricular and deep white matter chronic microvascular ischemic changes. There is no acute intracranial hemorrhage. No mass effect or midline shift. No extra-axial fluid collection. Vascular: No hyperdense vessel. Atherosclerotic vascular calcification. Skull: No acute osseous abnormality. Sinuses/Orbits: Unremarkable Other: None CT CERVICAL SPINE FINDINGS Alignment: Straightening of the normal cervical lordosis. No evidence of listhesis. Skull base and vertebrae: No acute fracture. No primary bone lesion or focal pathologic process. Prior posterior decompression with cervical fusion  spanning from C5-C7 without evidence of hardware complication. Additionally, there is bony ankylosis across these levels. Soft tissues and spinal canal: No prevertebral fluid or swelling. No visible canal hematoma. Disc levels: Multilevel degenerative changes spine with disc space narrowing, facet/uncovertebral hypertrophy and disc osteophyte complex ease. Upper chest: Biapical scarring. Other: Bilateral carotid artery stents. IMPRESSION: 1. No evidence of acute intracranial abnormality. 2. No evidence of acute fracture or subluxation of the cervical spine. Electronically Signed   By: Dahlia Bailiff MD   On: 08/07/2020 16:22   CT Cervical Spine Wo Contrast  Result Date: 08/07/2020 CLINICAL DATA:  syncopal episode at home today. Pt has neck pain headache, back pain. Stent placement 2 weeks ago. Pt on plavix EXAM: CT HEAD WITHOUT CONTRAST CT CERVICAL SPINE WITHOUT CONTRAST TECHNIQUE: Multidetector  CT imaging of the head and cervical spine was performed following the standard protocol without intravenous contrast. Multiplanar CT image reconstructions of the cervical spine were also generated. COMPARISON:  MRI brain July 12, 2020, head CT September 08, 2019 CT neck February 09, 2013 and May 01, 2012. FINDINGS: CT HEAD FINDINGS Brain: Similar age related global parenchymal volume loss. Unchanged mild burden of periventricular and deep white matter chronic microvascular ischemic changes. There is no acute intracranial hemorrhage. No mass effect or midline shift. No extra-axial fluid collection. Vascular: No hyperdense vessel. Atherosclerotic vascular calcification. Skull: No acute osseous abnormality. Sinuses/Orbits: Unremarkable Other: None CT CERVICAL SPINE FINDINGS Alignment: Straightening of the normal cervical lordosis. No evidence of listhesis. Skull base and vertebrae: No acute fracture. No primary bone lesion or focal pathologic process. Prior posterior decompression with cervical fusion spanning from C5-C7  without evidence of hardware complication. Additionally, there is bony ankylosis across these levels. Soft tissues and spinal canal: No prevertebral fluid or swelling. No visible canal hematoma. Disc levels: Multilevel degenerative changes spine with disc space narrowing, facet/uncovertebral hypertrophy and disc osteophyte complex ease. Upper chest: Biapical scarring. Other: Bilateral carotid artery stents. IMPRESSION: 1. No evidence of acute intracranial abnormality. 2. No evidence of acute fracture or subluxation of the cervical spine. Electronically Signed   By: Dahlia Bailiff MD   On: 08/07/2020 16:22   MR Cervical Spine W or Wo Contrast  Result Date: 08/07/2020 CLINICAL DATA:  Syncope with neck pain EXAM: MRI CERVICAL SPINE WITHOUT AND WITH CONTRAST TECHNIQUE: Multiplanar and multiecho pulse sequences of the cervical spine, to include the craniocervical junction and cervicothoracic junction, were obtained without and with intravenous contrast. CONTRAST:  44mL GADAVIST GADOBUTROL 1 MMOL/ML IV SOLN COMPARISON:  None. FINDINGS: Alignment: Grade 1 anterolisthesis at C3-4 Vertebrae: C5-7 ACDF.  No acute fracture. Cord: Symmetric bilateral hyperintense T2-weighted signal within the spinal cord at the C4 level. Otherwise normal signal. Posterior Fossa, vertebral arteries, paraspinal tissues: Negative. Disc levels: C1-2: Negative C2-3: Left-greater-than-right facet and uncovertebral hypertrophy. Mild spinal canal stenosis. Severe left neural foraminal stenosis. C3-4: Small central disc extrusion with components of superior and inferior migration. Mild facet hypertrophy. Mild spinal canal stenosis. Severe bilateral neural foraminal stenosis. C4-5: Small disc bulge with bilateral uncovertebral hypertrophy. There is no spinal canal stenosis. Severe bilateral neural foraminal stenosis. C5-6: Anterior fusion. There is no spinal canal stenosis. Mild bilateral neural foraminal stenosis. C6-7: Anterior fusion. There is no  spinal canal stenosis. Mild left neural foraminal stenosis. C7-T1: Normal disc space and facet joints. There is no spinal canal stenosis. No neural foraminal stenosis. IMPRESSION: 1. C5-7 ACDF without spinal canal stenosis. 2. Severe bilateral neural foraminal stenosis at C3-4 and C4-5. 3. Mild spinal canal stenosis at C2-3 and C3-4. 4. Symmetric bilateral hyperintense T2-weighted signal within the spinal cord at the C4 level, consistent with myelomalacia. Electronically Signed   By: Ulyses Jarred M.D.   On: 08/07/2020 22:43   DG Chest Portable 1 View  Result Date: 08/07/2020 CLINICAL DATA:  75 year old male with chest pain. EXAM: PORTABLE CHEST 1 VIEW COMPARISON:  Chest radiograph dated 07/11/2020. FINDINGS: There is diffuse chronic interstitial coarsening and bronchitic changes. No focal consolidation, pleural effusion or pneumothorax. The cardiac silhouette is within limits. Median sternotomy wires and CABG vascular clips. Osteopenia with degenerative changes of the spine. Lower cervical ACDF and partially visualized left humeral fixation hardware. No acute osseous pathology. IMPRESSION: No active disease. Electronically Signed   By: Anner Crete M.D.   On: 08/07/2020  16:34   US Abdomen Limited RUQ (LIVER/GB)  Result Date: 08/08/2020 CLINICAL DATA:  Acute pancreatitis EXAM: ULTRASOUND ABDOMEN LIMITED RIGHT UPPER QUADRANT COMPARISON:  Ultrasound 02/24/2013 FINDINGS: Gallbladder: No gallstones or wall thickening visualized. No sonographic Murphy sign noted by sonographer. Common bile duct: Diameter: 6.5 mm, within normal limits for senescent change. Liver: No focal lesion identified. Within normal limits in parenchymal echogenicity. Portal vein is patent on color Doppler imaging with normal direction of blood flow towards the liver. Other: Unable to position patient decubitus due to neck injury. Incidental note made of an 11 mm calculus in the lower pole right kidney. IMPRESSION: 1. No acute  hepatobiliary abnormality. 2. Incidental note made of an 11 mm calculus in the lower pole right kidney. Electronically Signed   By: Lovena Le M.D.   On: 08/08/2020 01:42     CODE STATUS:     Code Status Orders  (From admission, onward)         Start     Ordered   08/08/20 0026  Full code  Continuous        08/08/20 0027        Code Status History    Date Active Date Inactive Code Status Order ID Comments User Context   07/11/2020 1844 07/21/2020 1743 Full Code [id number]  Orene Desanctis, DO ED   12/12/2019 1023 12/13/2019 1947 Full Code [id number]  Collier Bullock, MD ED   09/09/2019 0830 09/11/2019 0013 Full Code [id number]  Ivor Costa, MD ED   07/16/2019 0639 07/16/2019 1957 Full Code [id number]  Mansy, Arvella Merles, MD Inpatient   09/26/2016 2316 09/29/2016 1757 Full Code [id number]  Bristow, [place], DO ED   09/20/2016 1745 09/24/2016 1709 Full Code [id number]  Henreitta Leber, MD Inpatient   07/31/2015 0332 08/01/2015 1832 Full Code [id number]  Harrie Foreman, MD Inpatient   03/13/2015 0251 03/16/2015 1726 Full Code [id number]  Hower, Aaron Mose, MD ED   Advance Care Planning Activity       TOTAL TIME TAKING CARE OF THIS PATIENT: *35* minutes.    Fritzi Mandes M.D  [facility]    CC: Primary care physician; [name], MD

## 2020-09-03 ENCOUNTER — Other Ambulatory Visit: Payer: Self-pay

## 2020-09-03 ENCOUNTER — Emergency Department: Payer: Medicare PPO

## 2020-09-03 ENCOUNTER — Inpatient Hospital Stay
Admission: EM | Admit: 2020-09-03 | Discharge: 2020-09-06 | DRG: 392 | Disposition: A | Discharge status: Home Health | Payer: Medicare PPO | Attending: Internal Medicine | Admitting: Internal Medicine

## 2020-09-03 DIAGNOSIS — E785 Hyperlipidemia, unspecified: Secondary | ICD-10-CM | POA: Diagnosis present

## 2020-09-03 DIAGNOSIS — I10 Essential (primary) hypertension: Secondary | ICD-10-CM | POA: Diagnosis present

## 2020-09-03 DIAGNOSIS — E871 Hypo-osmolality and hyponatremia: Secondary | ICD-10-CM | POA: Diagnosis present

## 2020-09-03 DIAGNOSIS — Z955 Presence of coronary angioplasty implant and graft: Secondary | ICD-10-CM

## 2020-09-03 DIAGNOSIS — Z87891 Personal history of nicotine dependence: Secondary | ICD-10-CM

## 2020-09-03 DIAGNOSIS — Z7984 Long term (current) use of oral hypoglycemic drugs: Secondary | ICD-10-CM

## 2020-09-03 DIAGNOSIS — Z7902 Long term (current) use of antithrombotics/antiplatelets: Secondary | ICD-10-CM

## 2020-09-03 DIAGNOSIS — K838 Other specified diseases of biliary tract: Secondary | ICD-10-CM

## 2020-09-03 DIAGNOSIS — R55 Syncope and collapse: Secondary | ICD-10-CM | POA: Diagnosis present

## 2020-09-03 DIAGNOSIS — Z79899 Other long term (current) drug therapy: Secondary | ICD-10-CM

## 2020-09-03 DIAGNOSIS — Z8719 Personal history of other diseases of the digestive system: Secondary | ICD-10-CM

## 2020-09-03 DIAGNOSIS — K5900 Constipation, unspecified: Secondary | ICD-10-CM | POA: Diagnosis present

## 2020-09-03 DIAGNOSIS — R17 Unspecified jaundice: Secondary | ICD-10-CM | POA: Diagnosis present

## 2020-09-03 DIAGNOSIS — R197 Diarrhea, unspecified: Secondary | ICD-10-CM | POA: Diagnosis present

## 2020-09-03 DIAGNOSIS — Z20822 Contact with and (suspected) exposure to covid-19: Secondary | ICD-10-CM | POA: Diagnosis present

## 2020-09-03 DIAGNOSIS — I2510 Atherosclerotic heart disease of native coronary artery without angina pectoris: Secondary | ICD-10-CM | POA: Diagnosis present

## 2020-09-03 DIAGNOSIS — Z951 Presence of aortocoronary bypass graft: Secondary | ICD-10-CM | POA: Diagnosis not present

## 2020-09-03 DIAGNOSIS — R1013 Epigastric pain: Secondary | ICD-10-CM | POA: Diagnosis not present

## 2020-09-03 DIAGNOSIS — I255 Ischemic cardiomyopathy: Secondary | ICD-10-CM | POA: Diagnosis not present

## 2020-09-03 DIAGNOSIS — I252 Old myocardial infarction: Secondary | ICD-10-CM | POA: Diagnosis not present

## 2020-09-03 DIAGNOSIS — I480 Paroxysmal atrial fibrillation: Secondary | ICD-10-CM | POA: Diagnosis present

## 2020-09-03 DIAGNOSIS — Z888 Allergy status to other drugs, medicaments and biological substances status: Secondary | ICD-10-CM | POA: Diagnosis not present

## 2020-09-03 DIAGNOSIS — Z7901 Long term (current) use of anticoagulants: Secondary | ICD-10-CM

## 2020-09-03 DIAGNOSIS — K219 Gastro-esophageal reflux disease without esophagitis: Secondary | ICD-10-CM | POA: Diagnosis present

## 2020-09-03 DIAGNOSIS — E860 Dehydration: Secondary | ICD-10-CM | POA: Diagnosis present

## 2020-09-03 DIAGNOSIS — E1151 Type 2 diabetes mellitus with diabetic peripheral angiopathy without gangrene: Secondary | ICD-10-CM

## 2020-09-03 DIAGNOSIS — R109 Unspecified abdominal pain: Secondary | ICD-10-CM | POA: Diagnosis not present

## 2020-09-03 DIAGNOSIS — R079 Chest pain, unspecified: Secondary | ICD-10-CM | POA: Diagnosis present

## 2020-09-03 DIAGNOSIS — R7989 Other specified abnormal findings of blood chemistry: Secondary | ICD-10-CM | POA: Diagnosis present

## 2020-09-03 DIAGNOSIS — G894 Chronic pain syndrome: Secondary | ICD-10-CM | POA: Diagnosis present

## 2020-09-03 DIAGNOSIS — E119 Type 2 diabetes mellitus without complications: Secondary | ICD-10-CM | POA: Diagnosis present

## 2020-09-03 LAB — CBC
HCT: 54.2 % — ABNORMAL HIGH (ref 39.0–52.0)
Hemoglobin: 19.1 g/dL — ABNORMAL HIGH (ref 13.0–17.0)
MCH: 33.1 pg (ref 26.0–34.0)
MCHC: 35.2 g/dL (ref 30.0–36.0)
MCV: 93.9 fL (ref 80.0–100.0)
Platelets: 224 10*3/uL (ref 150–400)
RBC: 5.77 MIL/uL (ref 4.22–5.81)
RDW: 12.6 % (ref 11.5–15.5)
WBC: 9.3 10*3/uL (ref 4.0–10.5)
nRBC: 0 % (ref 0.0–0.2)

## 2020-09-03 LAB — URINALYSIS, COMPLETE (UACMP) WITH MICROSCOPIC
Bacteria, UA: NONE SEEN
Bilirubin Urine: NEGATIVE
Glucose, UA: >=500 mg/dL — AB
Hgb urine dipstick: NEGATIVE
Ketones, ur: 20 mg/dL — AB
Leukocytes,Ua: NEGATIVE
Nitrite: NEGATIVE
Protein, ur: NEGATIVE mg/dL
Specific Gravity, Urine: 1.03 (ref 1.005–1.030)
Squamous Epithelial / HPF: NONE SEEN (ref 0–5)
pH: 6 (ref 5.0–8.0)

## 2020-09-03 LAB — BILIRUBIN, FRACTIONATED(TOT/DIR/INDIR)
Bilirubin, Direct: <0.1 mg/dL (ref 0.0–0.2)
Total Bilirubin: 2.6 mg/dL — ABNORMAL HIGH (ref 0.3–1.2)

## 2020-09-03 LAB — COMPREHENSIVE METABOLIC PANEL
ALT: 25 U/L (ref 0–44)
AST: 25 U/L (ref 15–41)
Albumin: 4.5 g/dL (ref 3.5–5.0)
Alkaline Phosphatase: 86 U/L (ref 38–126)
Anion gap: 14 (ref 5–15)
BUN: 19 mg/dL (ref 8–23)
CO2: 20 mmol/L — ABNORMAL LOW (ref 22–32)
Calcium: 9.9 mg/dL (ref 8.9–10.3)
Chloride: 95 mmol/L — ABNORMAL LOW (ref 98–111)
Creatinine, Ser: 1.27 mg/dL — ABNORMAL HIGH (ref 0.61–1.24)
GFR, Estimated: 59 mL/min — ABNORMAL LOW (ref >60)
Glucose, Bld: 159 mg/dL — ABNORMAL HIGH (ref 70–99)
Potassium: 3.6 mmol/L (ref 3.5–5.1)
Sodium: 129 mmol/L — ABNORMAL LOW (ref 135–145)
Total Bilirubin: 3 mg/dL — ABNORMAL HIGH (ref 0.3–1.2)
Total Protein: 8.5 g/dL — ABNORMAL HIGH (ref 6.5–8.1)

## 2020-09-03 LAB — GLUCOSE, CAPILLARY
Glucose-Capillary: 109 mg/dL — ABNORMAL HIGH (ref 70–99)
Glucose-Capillary: 90 mg/dL (ref 70–99)
Glucose-Capillary: 95 mg/dL (ref 70–99)

## 2020-09-03 LAB — LIPASE, BLOOD: Lipase: 27 U/L (ref 11–51)

## 2020-09-03 LAB — TROPONIN I (HIGH SENSITIVITY)
Troponin I (High Sensitivity): 5 ng/L (ref <18)
Troponin I (High Sensitivity): 7 ng/L (ref <18)

## 2020-09-03 LAB — SARS CORONAVIRUS 2 (TAT 6-24 HRS): SARS Coronavirus 2: NEGATIVE

## 2020-09-03 IMAGING — CT CT ABD-PELV W/ CM
2 of 5 series · 15 of 46 positions shown, 17 images · IV contrast (APPLIED)
Comparison: CT a chest, abdomen, and pelvis dated [DATE].

CLINICAL DATA: Centralized chest pain radiating to left arm with
nausea and vomiting. Recent cardiac stent placement 2 months ago.

EXAM:
CT ANGIOGRAPHY CHEST
CT ABDOMEN AND PELVIS WITH CONTRAST
TECHNIQUE: Multidetector CT imaging of the chest was performed using the
standard protocol during bolus administration of intravenous
contrast. Multiplanar CT image reconstructions and MIPs were
obtained to evaluate the vascular anatomy. Multidetector CT imaging
of the abdomen and pelvis was performed using the standard protocol
during bolus administration of intravenous contrast.
CONTRAST:  75mL OMNIPAQUE IOHEXOL 350 MG/ML SOLN

[Series 2: axial st · axial · 0.75mm/px · z∈[-650,-190]mm · 12 of 104 slices shown, 14 images]
[im 6/104  soft-tissue]
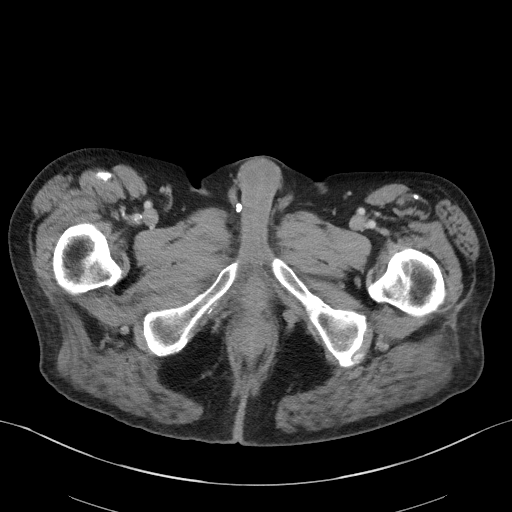
[im 6/104  bone]
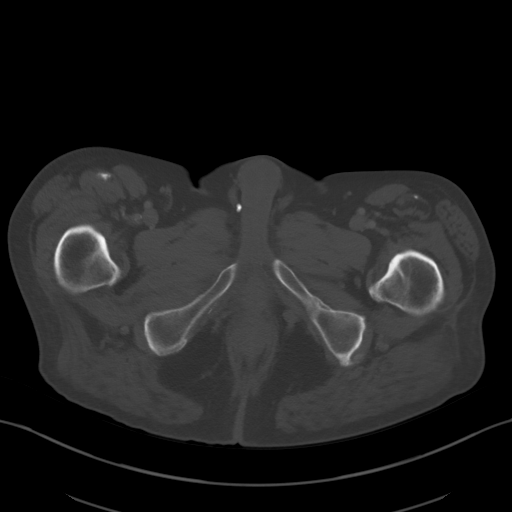
[im 18/104  soft-tissue]
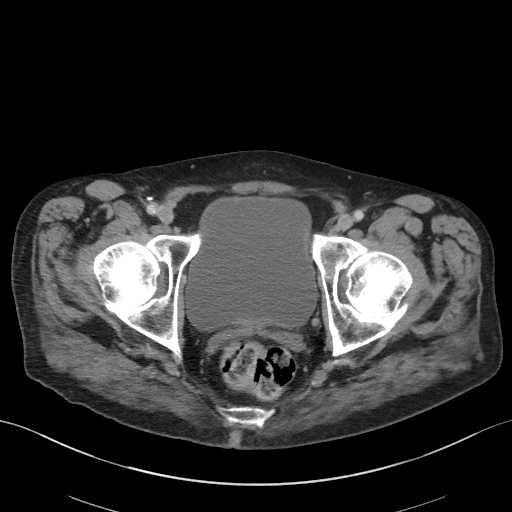
[im 23/104  soft-tissue]
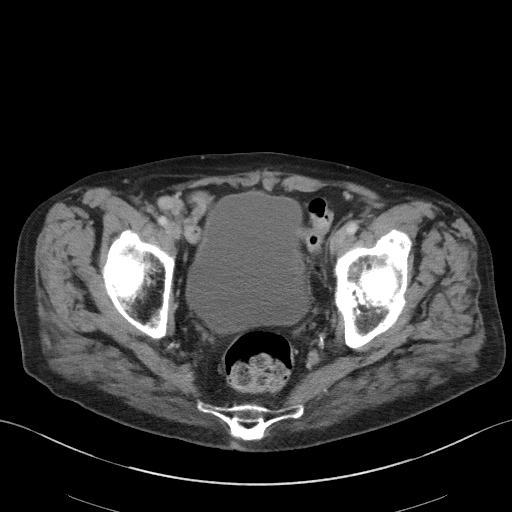
[im 29/104  soft-tissue]
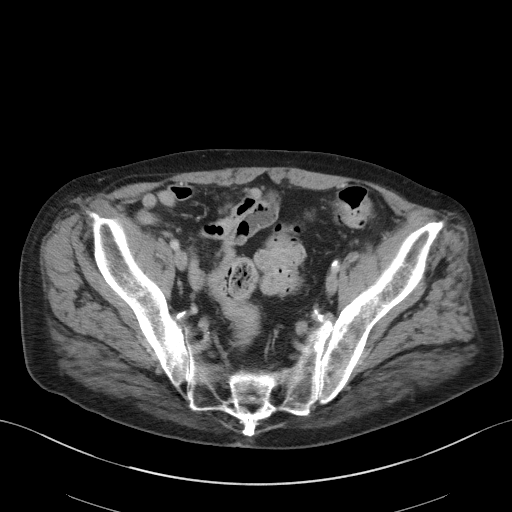
[im 41/104  soft-tissue]
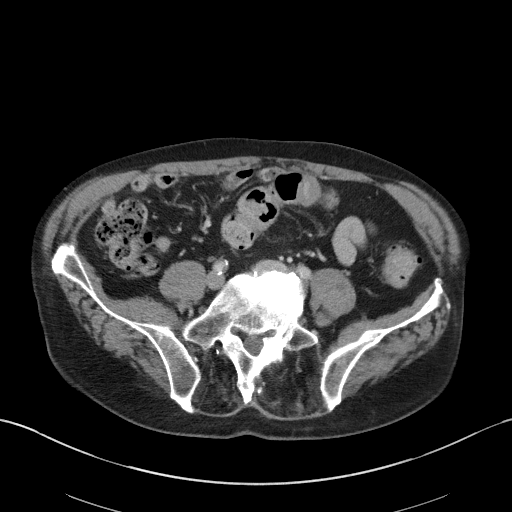
[im 46/104  soft-tissue]
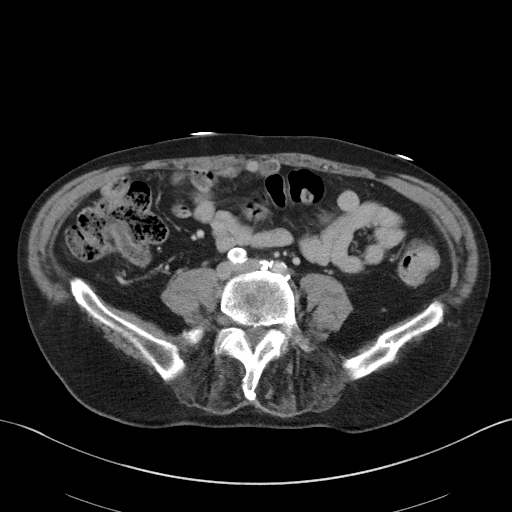
[im 58/104  soft-tissue]
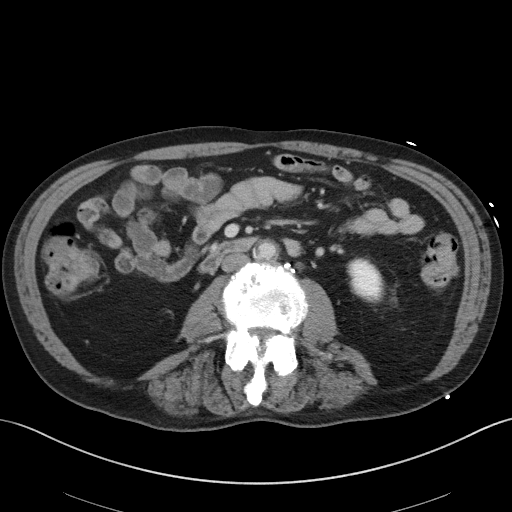
[im 63/104  soft-tissue]
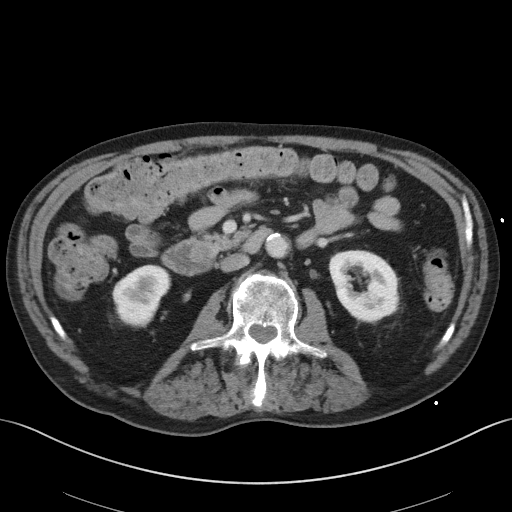
[im 75/104  soft-tissue]
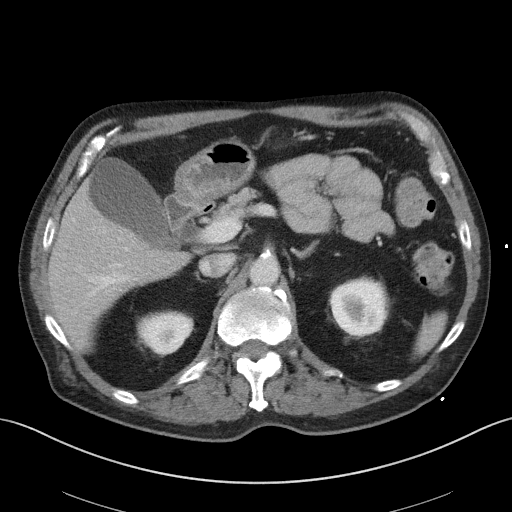
[im 75/104  bone]
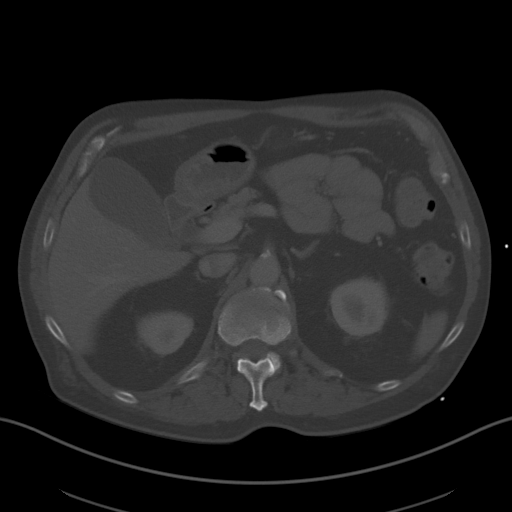
[im 81/104  soft-tissue]
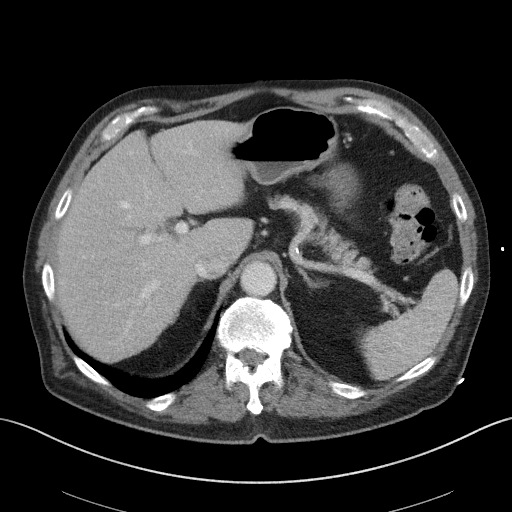
[im 86/104  soft-tissue]
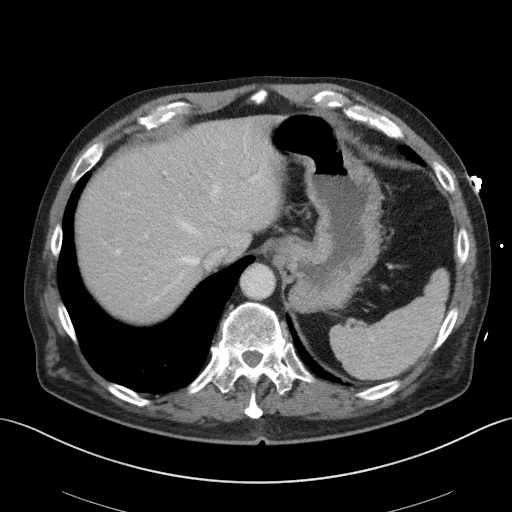
[im 98/104  soft-tissue]
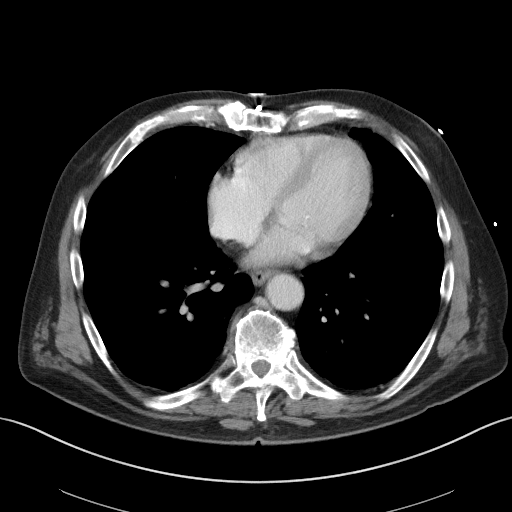

[Series 5: coronal st · coronal · 0.77mm/px · 3 of 87 slices shown]
[im 29/87  soft-tissue]
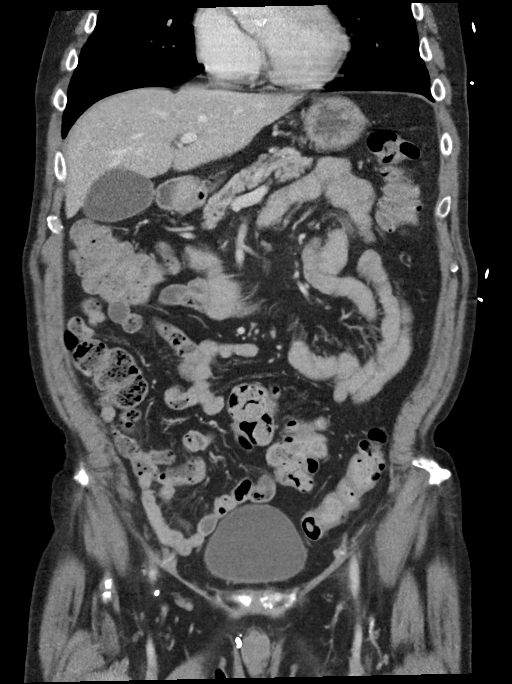
[im 39/87  soft-tissue]
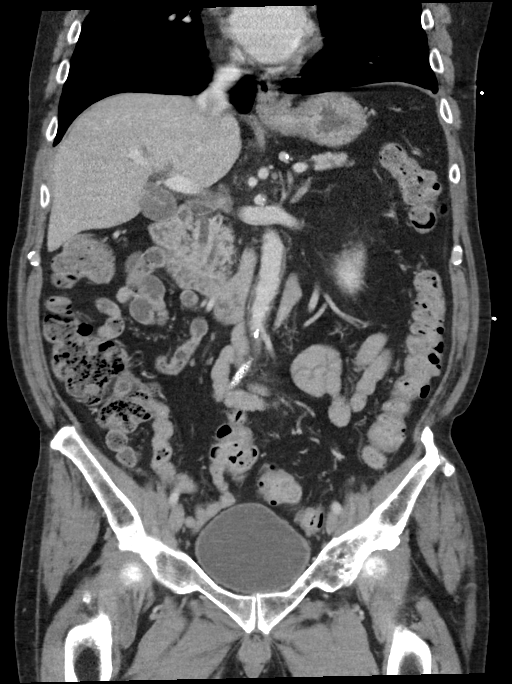
[im 48/87  soft-tissue]
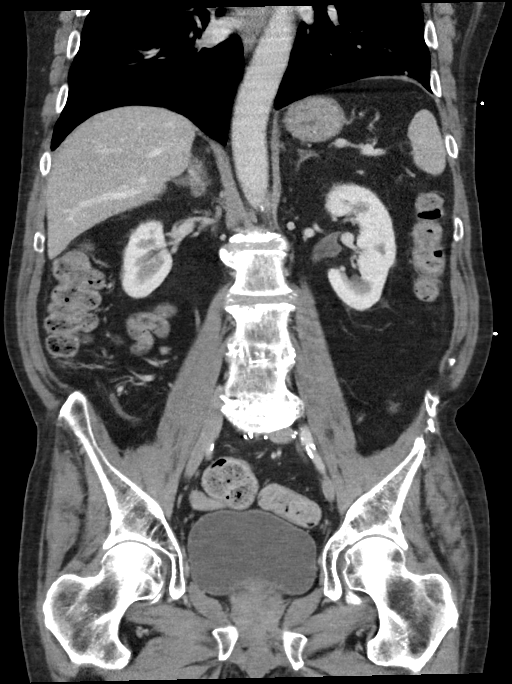

[15 of 46 positions shown; findings below may reference images not displayed]

FINDINGS: CTA CHEST FINDINGS

Cardiovascular: Satisfactory opacification of the pulmonary arteries
to the segmental level. No evidence of pulmonary embolism. Normal
heart size. No pericardial effusion. Prior CABG. Coronary, aortic
arch, and branch vessel atherosclerotic vascular disease.

Mediastinum/Nodes: No enlarged mediastinal, hilar, or axillary lymph
nodes. Thyroid gland, trachea, and esophagus demonstrate no
significant findings.

Lungs/Pleura: Unchanged biapical pleuroparenchymal scarring. No
focal consolidation, pleural effusion, or pneumothorax. No
suspicious pulmonary nodule.

Musculoskeletal: Unchanged mild bilateral gynecomastia. No acute or
significant osseous findings.

Review of the MIP images confirms the above findings.

CT ABDOMEN AND PELVIS FINDINGS

Hepatobiliary: No focal liver abnormality is seen. No gallstones or
gallbladder wall thickening. Mild intra- and extrahepatic biliary
dilatation. Common bile duct measures 10 mm in diameter.

Pancreas: Unchanged mild prominence of the main pancreatic duct. No
surrounding inflammatory changes.

Spleen: Normal in size without focal abnormality.

Adrenals/Urinary Tract: The adrenal glands are unremarkable.
Unchanged 11 mm right renal calculus. No hydronephrosis or renal
mass. The bladder is unremarkable.

Stomach/Bowel: Stomach is within normal limits. Appendix appears
normal. No evidence of bowel wall thickening, distention, or
inflammatory changes.

Vascular/Lymphatic: Aortic atherosclerosis. No enlarged abdominal or
pelvic lymph nodes.

Reproductive: Prostate is unremarkable.

Other: No abdominal wall hernia or abnormality. No abdominopelvic
ascites. No pneumoperitoneum.

Musculoskeletal: No acute or significant osseous findings.

Review of the MIP images confirms the above findings.
IMPRESSION: 1. No evidence of pulmonary embolism. No acute intrathoracic
process.
2. Mild intra- and extrahepatic biliary dilatation with common bile
duct measuring 10 mm in diameter. Correlation with LFTs is
recommended. If there is clinical concern for biliary obstruction,
consider further evaluation with MRCP or ERCP.
3. Unchanged 11 mm nonobstructive right nephrolithiasis.
4. Aortic Atherosclerosis ([AF]-[AF]).

## 2020-09-03 IMAGING — CT CT ANGIO CHEST
2 of 6 series · 18 of 46 positions shown · IV contrast (APPLIED)
Comparison: CT a chest, abdomen, and pelvis dated [DATE].

CLINICAL DATA: Centralized chest pain radiating to left arm with
nausea and vomiting. Recent cardiac stent placement 2 months ago.

EXAM:
CT ANGIOGRAPHY CHEST
CT ABDOMEN AND PELVIS WITH CONTRAST
TECHNIQUE: Multidetector CT imaging of the chest was performed using the
standard protocol during bolus administration of intravenous
contrast. Multiplanar CT image reconstructions and MIPs were
obtained to evaluate the vascular anatomy. Multidetector CT imaging
of the abdomen and pelvis was performed using the standard protocol
during bolus administration of intravenous contrast.
CONTRAST:  75mL OMNIPAQUE IOHEXOL 350 MG/ML SOLN

[Series 5: thins · axial · 0.75mm/px · z∈[-289,-10]mm · 15 of 307 slices shown]
[im 14/307  lung]
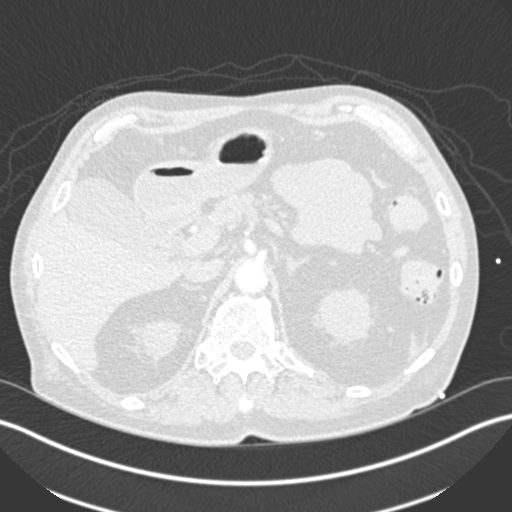
[im 40/307  soft-tissue]
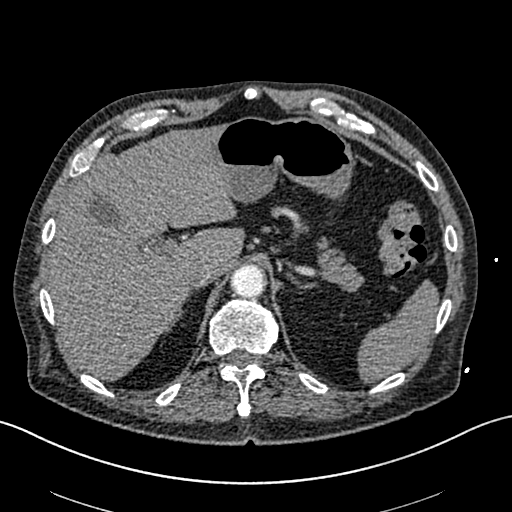
[im 54/307  lung]
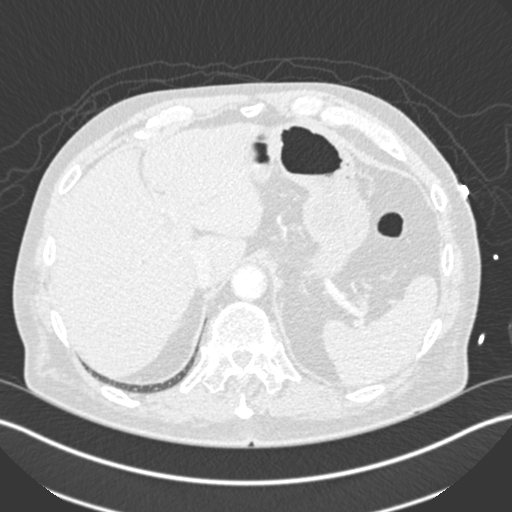
[im 80/307  soft-tissue]
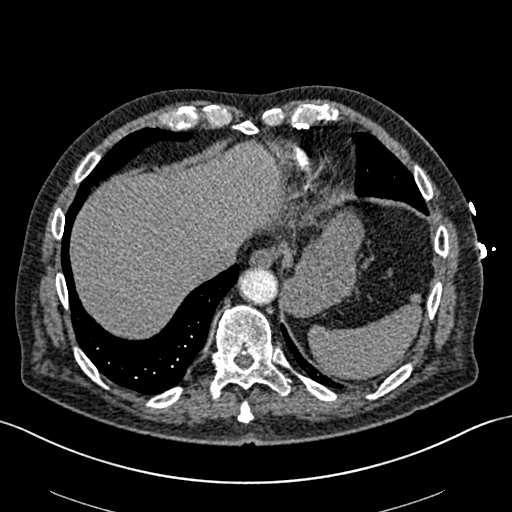
[im 94/307  lung]
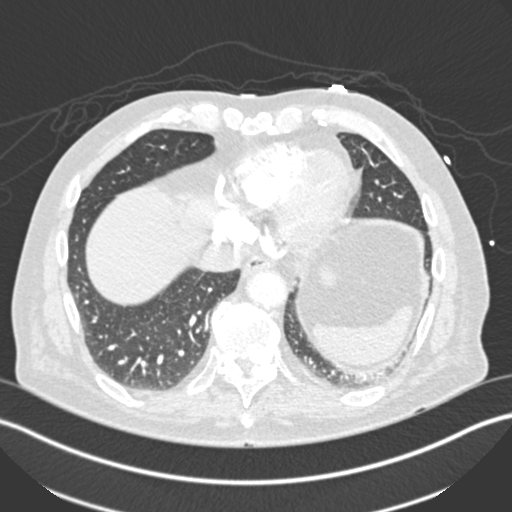
[im 120/307  soft-tissue]
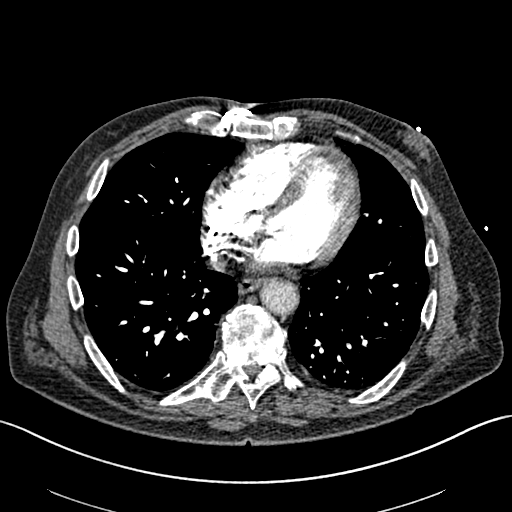
[im 134/307  lung]
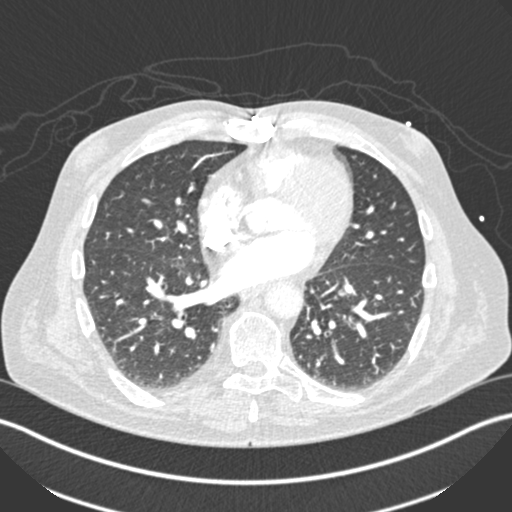
[im 160/307  soft-tissue]
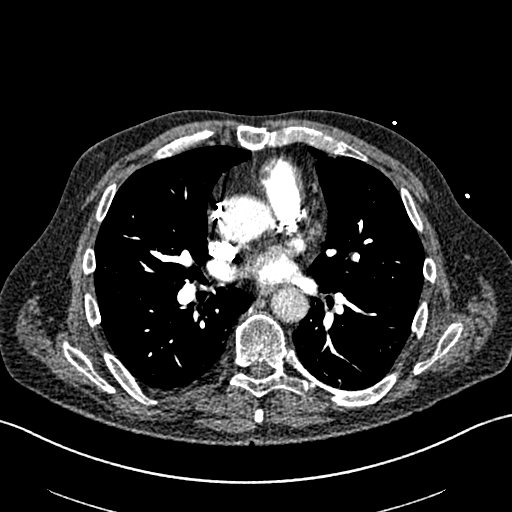
[im 173/307  lung]
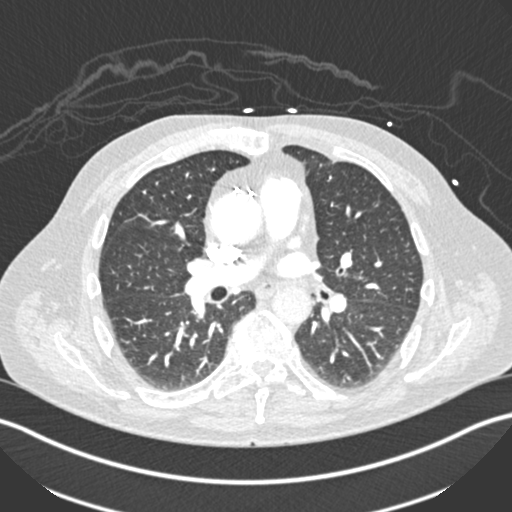
[im 187/307  soft-tissue]
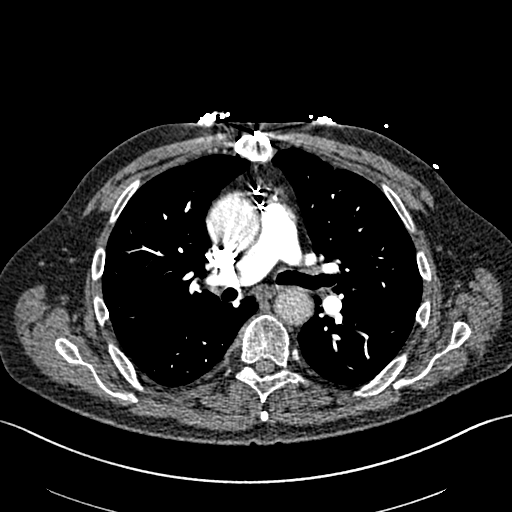
[im 213/307  lung]
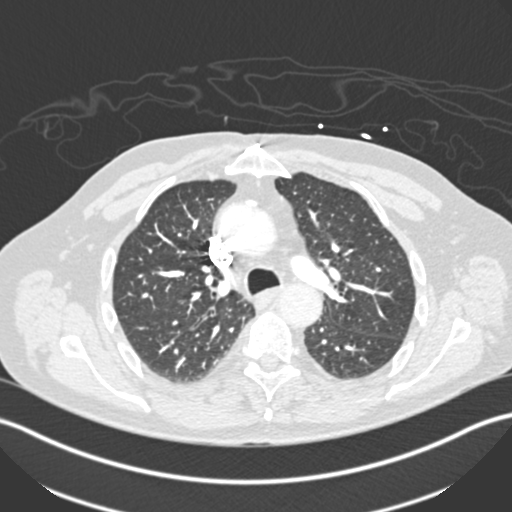
[im 227/307  soft-tissue]
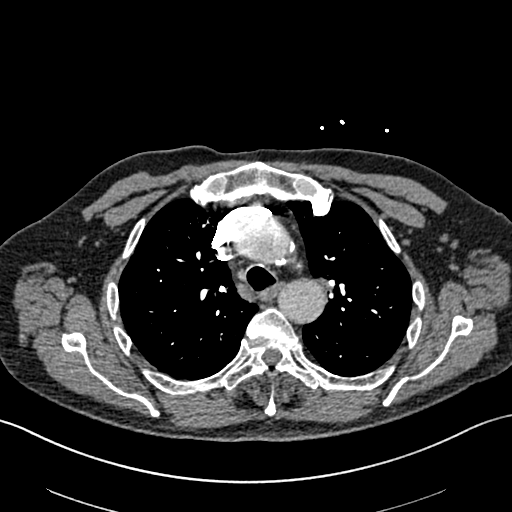
[im 253/307  lung]
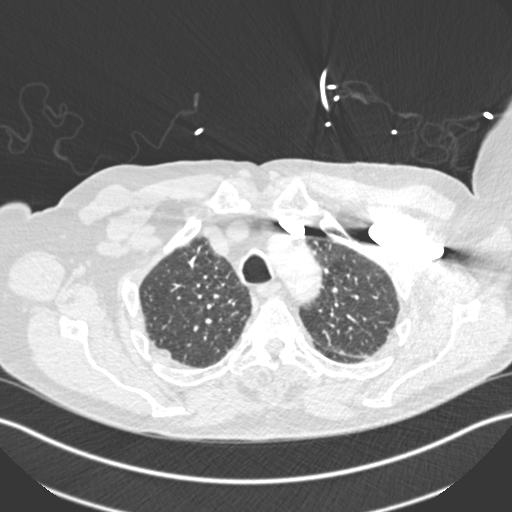
[im 267/307  soft-tissue]
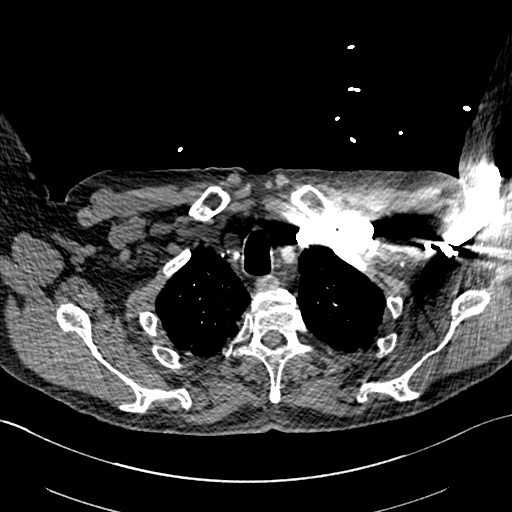
[im 293/307  lung]
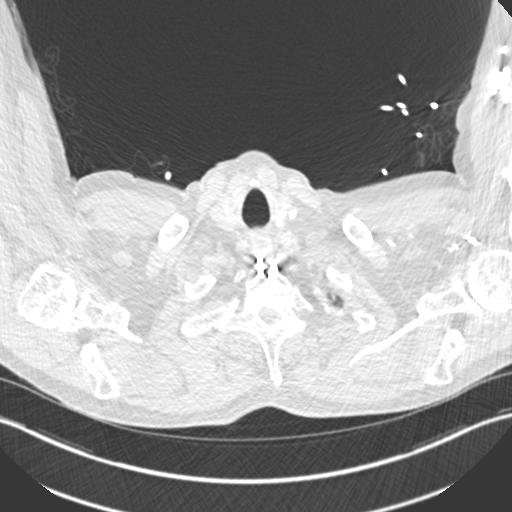

[Series 7: coronal mpr · coronal · 0.69mm/px · 3 of 133 slices shown]
[im 34/133  soft-tissue]
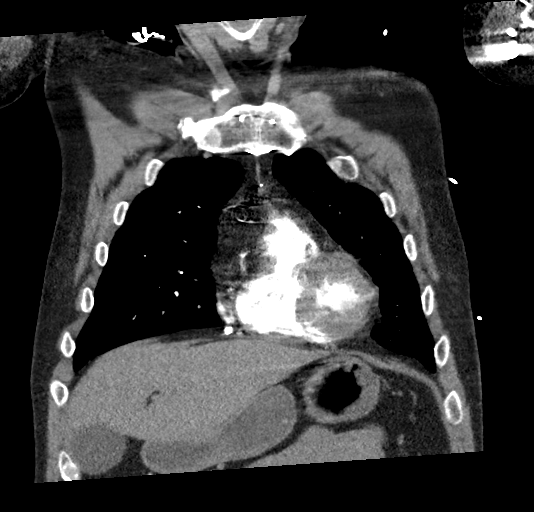
[im 67/133  soft-tissue]
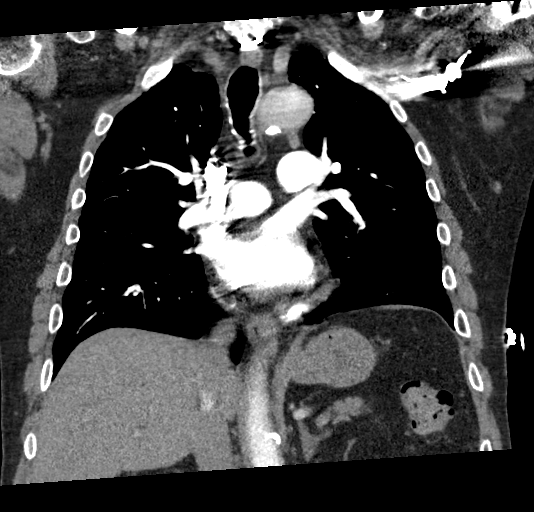
[im 100/133  soft-tissue]
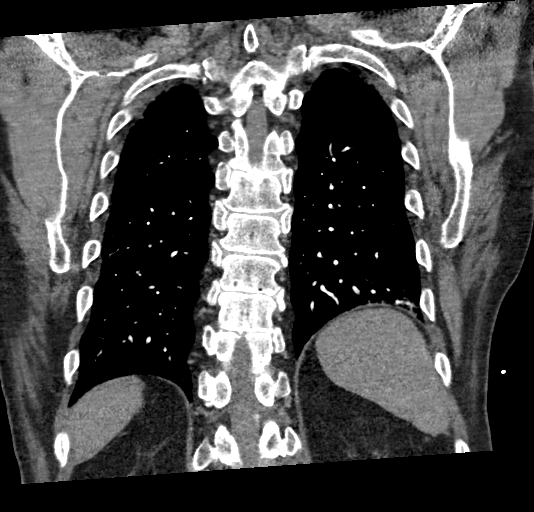

[18 of 46 positions shown; findings below may reference images not displayed]

FINDINGS: CTA CHEST FINDINGS

Cardiovascular: Satisfactory opacification of the pulmonary arteries
to the segmental level. No evidence of pulmonary embolism. Normal
heart size. No pericardial effusion. Prior CABG. Coronary, aortic
arch, and branch vessel atherosclerotic vascular disease.

Mediastinum/Nodes: No enlarged mediastinal, hilar, or axillary lymph
nodes. Thyroid gland, trachea, and esophagus demonstrate no
significant findings.

Lungs/Pleura: Unchanged biapical pleuroparenchymal scarring. No
focal consolidation, pleural effusion, or pneumothorax. No
suspicious pulmonary nodule.

Musculoskeletal: Unchanged mild bilateral gynecomastia. No acute or
significant osseous findings.

Review of the MIP images confirms the above findings.

CT ABDOMEN AND PELVIS FINDINGS

Hepatobiliary: No focal liver abnormality is seen. No gallstones or
gallbladder wall thickening. Mild intra- and extrahepatic biliary
dilatation. Common bile duct measures 10 mm in diameter.

Pancreas: Unchanged mild prominence of the main pancreatic duct. No
surrounding inflammatory changes.

Spleen: Normal in size without focal abnormality.

Adrenals/Urinary Tract: The adrenal glands are unremarkable.
Unchanged 11 mm right renal calculus. No hydronephrosis or renal
mass. The bladder is unremarkable.

Stomach/Bowel: Stomach is within normal limits. Appendix appears
normal. No evidence of bowel wall thickening, distention, or
inflammatory changes.

Vascular/Lymphatic: Aortic atherosclerosis. No enlarged abdominal or
pelvic lymph nodes.

Reproductive: Prostate is unremarkable.

Other: No abdominal wall hernia or abnormality. No abdominopelvic
ascites. No pneumoperitoneum.

Musculoskeletal: No acute or significant osseous findings.

Review of the MIP images confirms the above findings.
IMPRESSION: 1. No evidence of pulmonary embolism. No acute intrathoracic
process.
2. Mild intra- and extrahepatic biliary dilatation with common bile
duct measuring 10 mm in diameter. Correlation with LFTs is
recommended. If there is clinical concern for biliary obstruction,
consider further evaluation with MRCP or ERCP.
3. Unchanged 11 mm nonobstructive right nephrolithiasis.
4. Aortic Atherosclerosis ([AF]-[AF]).

## 2020-09-03 IMAGING — DX DG CHEST 1V PORT
1 series · 1 of 1 positions shown · non-contrast
Comparison: [DATE]

CLINICAL DATA: Shortness of breath, left-sided chest pain

EXAM:
PORTABLE CHEST 1 VIEW

[chest ap]
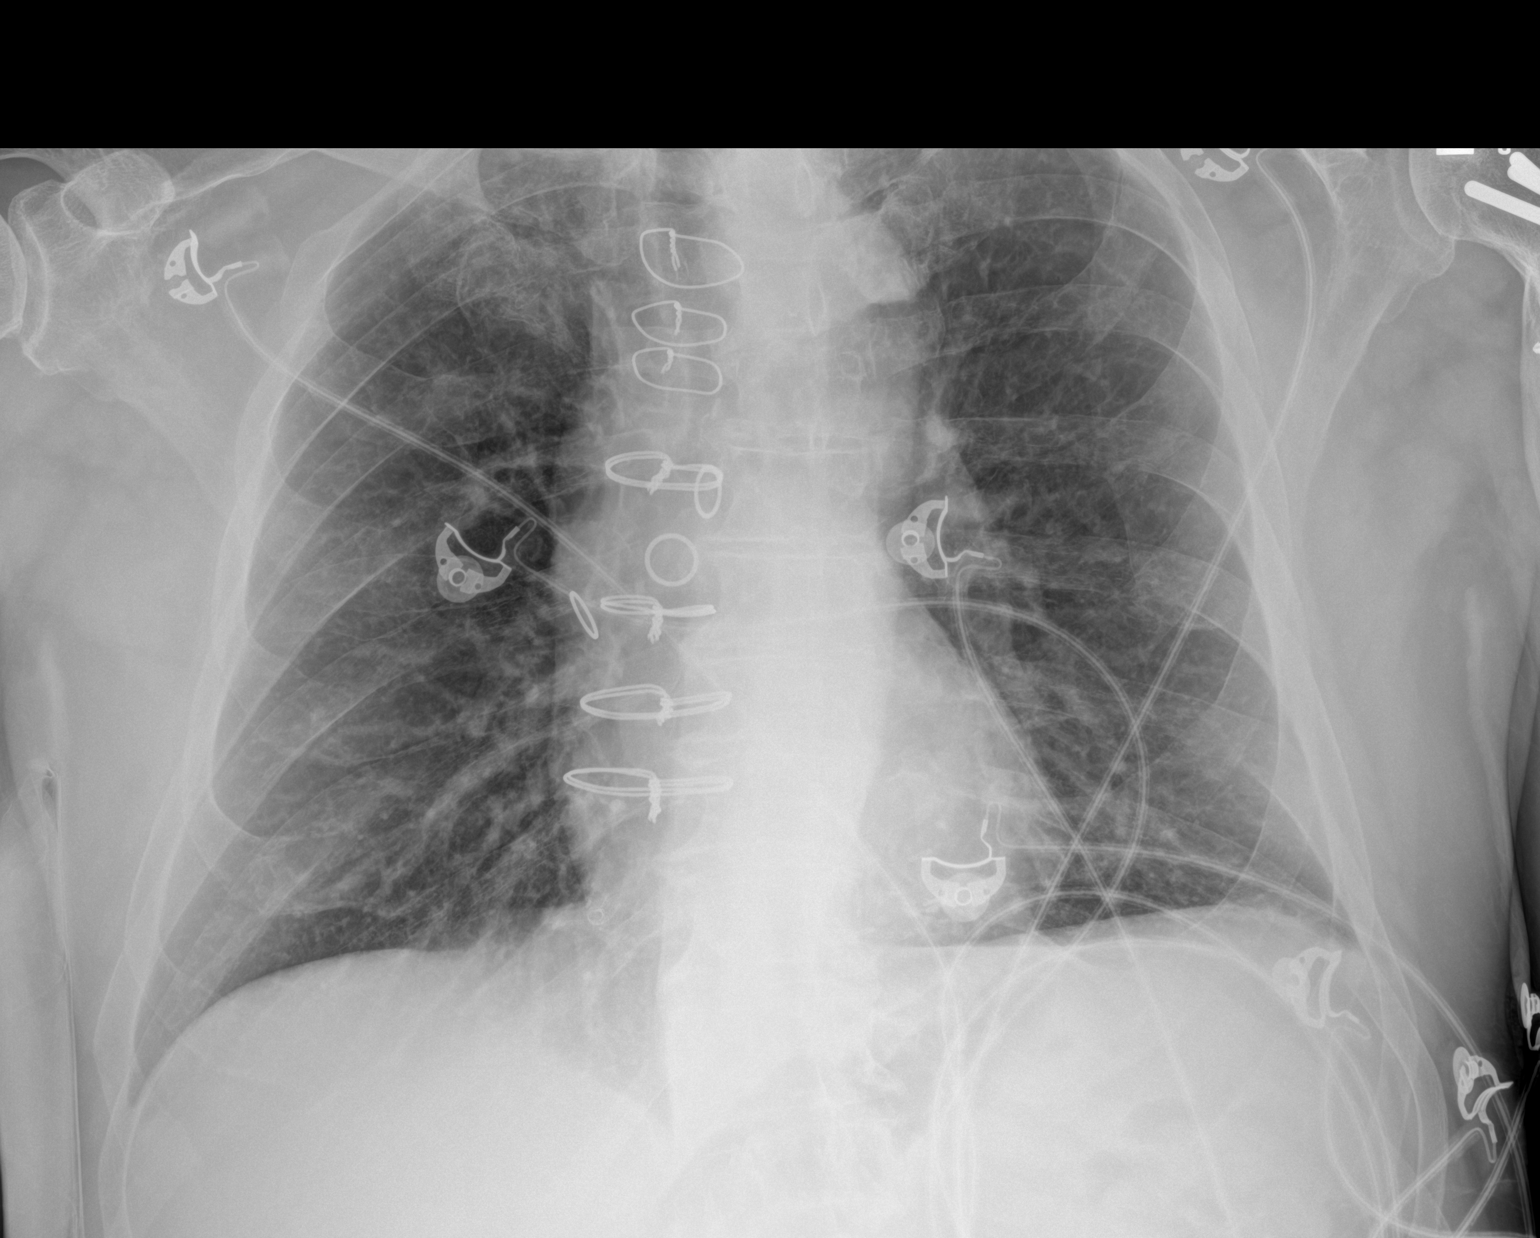

[1 of 1 positions shown; findings below may reference images not displayed]

FINDINGS: Cardiac shadow is within normal limits. Postsurgical changes are
again seen. Lungs are well aerated bilaterally. No focal infiltrate
or effusion is seen. No acute bony abnormality is noted.
Postsurgical changes in the cervical spine and proximal left humerus
are noted.
IMPRESSION: No acute abnormality seen.

## 2020-09-03 MED ORDER — TRAZODONE HCL 50 MG PO TABS
50.0000 mg | ORAL_TABLET | Freq: Every evening | ORAL | Status: DC | PRN
  Administered 2020-09-03 – 2020-09-04 (×2): 50 mg via ORAL
  Filled 2020-09-03 (×2): qty 1

## 2020-09-03 MED ORDER — GABAPENTIN 100 MG PO CAPS
100.0000 mg | ORAL_CAPSULE | Freq: Three times a day (TID) | ORAL | Status: DC
  Administered 2020-09-03 – 2020-09-06 (×8): 100 mg via ORAL
  Filled 2020-09-03 (×8): qty 1

## 2020-09-03 MED ORDER — ONDANSETRON HCL 4 MG/2ML IJ SOLN: 4.0000 mg | Freq: Once | INTRAMUSCULAR | Status: AC

## 2020-09-03 MED ORDER — FENTANYL CITRATE (PF) 100 MCG/2ML IJ SOLN
50.0000 ug | INTRAMUSCULAR | Status: DC | PRN
  Administered 2020-09-03: 50 ug via INTRAVENOUS
  Filled 2020-09-03: qty 2

## 2020-09-03 MED ORDER — SODIUM CHLORIDE 0.9 % IV SOLN
INTRAVENOUS | Status: DC
  Administered 2020-09-03: 100 mL/h via INTRAVENOUS

## 2020-09-03 MED ORDER — SODIUM CHLORIDE 0.9 % IV BOLUS
500.0000 mL | Freq: Once | INTRAVENOUS | Status: AC
  Administered 2020-09-03: 500 mL via INTRAVENOUS

## 2020-09-03 MED ORDER — NITROGLYCERIN 0.4 MG SL SUBL: 0.4000 mg | SUBLINGUAL_TABLET | SUBLINGUAL | Status: DC | PRN

## 2020-09-03 MED ORDER — CARVEDILOL 6.25 MG PO TABS
3.1250 mg | ORAL_TABLET | Freq: Two times a day (BID) | ORAL | Status: DC
  Administered 2020-09-03 – 2020-09-06 (×6): 3.125 mg via ORAL
  Filled 2020-09-03 (×6): qty 1

## 2020-09-03 MED ORDER — CLOPIDOGREL BISULFATE 75 MG PO TABS
75.0000 mg | ORAL_TABLET | Freq: Every morning | ORAL | Status: DC
  Administered 2020-09-04 – 2020-09-06 (×3): 75 mg via ORAL
  Filled 2020-09-03 (×3): qty 1

## 2020-09-03 MED ORDER — ONDANSETRON HCL 4 MG PO TABS
4.0000 mg | ORAL_TABLET | Freq: Four times a day (QID) | ORAL | Status: DC | PRN
  Administered 2020-09-04: 4 mg via ORAL
  Filled 2020-09-03 (×2): qty 1

## 2020-09-03 MED ORDER — ATORVASTATIN CALCIUM 20 MG PO TABS
80.0000 mg | ORAL_TABLET | Freq: Every day | ORAL | Status: DC
  Administered 2020-09-04 – 2020-09-06 (×3): 80 mg via ORAL
  Filled 2020-09-03 (×3): qty 4

## 2020-09-03 MED ORDER — MORPHINE SULFATE (PF) 4 MG/ML IV SOLN
4.0000 mg | INTRAVENOUS | Status: DC | PRN
Start: 2020-09-03 — End: 2020-09-05
  Administered 2020-09-03 – 2020-09-05 (×11): 4 mg via INTRAVENOUS
  Filled 2020-09-03 (×11): qty 1

## 2020-09-03 MED ORDER — ONDANSETRON HCL 4 MG/2ML IJ SOLN
4.0000 mg | Freq: Four times a day (QID) | INTRAMUSCULAR | Status: DC | PRN
  Administered 2020-09-03 – 2020-09-05 (×6): 4 mg via INTRAVENOUS
  Filled 2020-09-03 (×6): qty 2

## 2020-09-03 MED ORDER — ISOSORBIDE MONONITRATE ER 60 MG PO TB24
30.0000 mg | ORAL_TABLET | Freq: Every day | ORAL | Status: DC
  Administered 2020-09-03 – 2020-09-06 (×4): 30 mg via ORAL
  Filled 2020-09-03 (×4): qty 1

## 2020-09-03 MED ORDER — ONDANSETRON HCL 4 MG/2ML IJ SOLN
INTRAMUSCULAR | Status: AC
  Administered 2020-09-03: 4 mg via INTRAVENOUS
  Filled 2020-09-03: qty 2

## 2020-09-03 MED ORDER — PROCHLORPERAZINE EDISYLATE 10 MG/2ML IJ SOLN
10.0000 mg | Freq: Once | INTRAMUSCULAR | Status: AC
  Administered 2020-09-03: 10 mg via INTRAVENOUS
  Filled 2020-09-03: qty 2

## 2020-09-03 MED ORDER — ADULT MULTIVITAMIN W/MINERALS CH
1.0000 | ORAL_TABLET | Freq: Every day | ORAL | Status: DC
  Administered 2020-09-04 – 2020-09-06 (×3): 1 via ORAL
  Filled 2020-09-03 (×3): qty 1

## 2020-09-03 MED ORDER — IOHEXOL 350 MG/ML SOLN
75.0000 mL | Freq: Once | INTRAVENOUS | Status: AC | PRN
  Administered 2020-09-03: 75 mL via INTRAVENOUS

## 2020-09-03 MED ORDER — PANTOPRAZOLE SODIUM 40 MG IV SOLR
40.0000 mg | INTRAVENOUS | Status: DC
  Administered 2020-09-03 – 2020-09-04 (×2): 40 mg via INTRAVENOUS
  Filled 2020-09-03 (×2): qty 40

## 2020-09-03 MED ORDER — EZETIMIBE 10 MG PO TABS
10.0000 mg | ORAL_TABLET | Freq: Every day | ORAL | Status: DC
  Administered 2020-09-04 – 2020-09-06 (×3): 10 mg via ORAL
  Filled 2020-09-03 (×3): qty 1

## 2020-09-03 MED ORDER — SPIRONOLACTONE 25 MG PO TABS
25.0000 mg | ORAL_TABLET | Freq: Every day | ORAL | Status: DC
  Administered 2020-09-04: 25 mg via ORAL
  Filled 2020-09-03: qty 1

## 2020-09-03 MED ORDER — INSULIN ASPART 100 UNIT/ML ~~LOC~~ SOLN
0.0000 [IU] | SUBCUTANEOUS | Status: DC
  Administered 2020-09-04: 2 [IU] via SUBCUTANEOUS
  Administered 2020-09-04: 1 [IU] via SUBCUTANEOUS
  Administered 2020-09-05: 2 [IU] via SUBCUTANEOUS
  Administered 2020-09-05 – 2020-09-06 (×3): 1 [IU] via SUBCUTANEOUS
  Filled 2020-09-03 (×7): qty 1

## 2020-09-03 NOTE — Plan of Care (Signed)

## 2020-09-03 NOTE — ED Triage Notes (Signed)
Pt to ed acems from eating breakfast, started having centralized cp with radiation to left arm, n/v.  Had stent placed 2 weeks ago.  1 inch of nitro paste placed by ems pta.  Vomiting on arrival  No asa given, allergic Hx pancreatitis

## 2020-09-03 NOTE — H&P (Addendum)
History and Physical    Bryan Scott CWC:[id number] DOB: 05/16/1946 DOA: 09/03/2020  PCP: [name], MD   Patient coming from: Home  I have personally briefly reviewed patient's old medical records in [place]  Chief Complaint: Chest pain  HPI: Bryan Scott is a 75 y.o. male with medical history significant for CAD s/p CABG (recent admission for NSTEMI s/p PCI with DES to [facility] 07/13/2020), PAF on Xarelto, T2DM, HTN, HLD, CAS, cervical spinal stenosis s/p C5-C7 ACDF, chronic pain syndrome, recent hospitalization for acute pancreatitis who presents to the ED via EMS for evaluation of chest pain.  Patient states that his symptoms started after eating breakfast.  Pain was mostly in the lower sternal area and according to patient radiated to his left arm with associated diaphoresis, nausea and vomiting.  Patient was concerned he may be having another cardiac event and so he called EMS.  EMS applied 1 inch of Nitropaste with some improvement in his pain. During my evaluation his chest pain has improved and he now complains of pain in his left lower quadrant which he rates as 6 to 7 x 10 in intensity and nonradiating. Patient denies having any headache, no blurred vision, no focal deficits, no palpitations, no fever, no chills, no cough, no nocturia, no dysuria, no dizziness, no lightheadedness, no diarrhea. Labs show sodium 129, potassium 3.6, chloride 95, bicarb 20, BUN 19, creatinine 1.27, calcium 9.9, alkaline phosphatase 86, albumin 4.5, lipase 27, AST 25, ALT 25, total protein 8.5, total bilirubin 3.0 >> 2.6, troponin 7, white count 9.3, hemoglobin 19.1, hematocrit 54.2, MCV 93.9, RDW 12.6, platelet count 224 CT angiogram of the chest/abdomen and pelvis shows no evidence of pulmonary embolism. No acute intrathoracic Process. Mild intra- and extrahepatic biliary dilatation with common bile duct measuring 10 mm in diameter. Correlation with LFTs is recommended. If there is clinical  concern for biliary obstruction, consider further evaluation with MRCP or ERCP. Unchanged 11 mm nonobstructive right nephrolithiasis. Aortic Atherosclerosis  Chest x-ray reviewed by me shows no acute abnormalities Twelve-lead EKG reviewed by me shows sinus rhythm with PVCs.    ED Course: Patient is a 75 year old Caucasian male who presents to the ER via EMS for evaluation of chest pain.  He has a history of coronary artery disease and is status post recent stent angioplasty.  Pain was mostly in the lower sternal area with radiation to the left arm and associated with diaphoresis, nausea and vomiting.  He has no acute EKG changes 2 sets of troponin are within normal limits.  Patient is noted to have elevated total bilirubin levels and imaging shows a dilated common bile duct.  He was recently hospitalized for acute pancreatitis.  He will be admitted to the hospital for further evaluation.     Review of Systems: As per HPI otherwise all other systems reviewed and negative.    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    Angioedema   Cancer (HCC)    Carotid artery disease ([place])    > 75% bilateral ICA stenoses on 01/2013 CT   Chronic pain syndrome    Coronary artery disease    s/p CABG ~ 2007   GERD (gastroesophageal reflux disease)    Heparin allergy    Bleeding   History of tobacco use    Hyperlipidemia    Hypertension    Ischemic cardiomyopathy    Paroxysmal atrial fibrillation (HCC)    On Xarelto anticoagulation  Type 2 diabetes mellitus ([place])    A1C 6.8% in 01/2013    Past Surgical History:  Procedure Laterality Date   CARDIAC CATHETERIZATION     CORONARY ARTERY BYPASS GRAFT  2007   CORONARY STENT INTERVENTION N/A 07/13/2020   Procedure: CORONARY STENT INTERVENTION;  Surgeon: Nelva Bush, MD;  Location: [facility];  Service: Cardiovascular;  Laterality: N/A;   LEFT HEART CATH AND CORS/GRAFTS ANGIOGRAPHY N/A 07/13/2020    Procedure: LEFT HEART CATH AND CORS/GRAFTS ANGIOGRAPHY;  Surgeon: Minna Merritts, MD;  Location: [facility];  Service: Cardiovascular;  Laterality: N/A;     reports that he has quit smoking. His smoking use included cigarettes. He has a 30.00 pack-year smoking history. He has never used smokeless tobacco. He reports that he does not drink alcohol and does not use drugs.  Allergies  Allergen Reactions   Aspergum [Aspirin] Anaphylaxis   Heparin Other (See Comments)    Reaction:  Bleeding    Lisinopril Swelling    Family History  Problem Relation Age of Onset   Heart disease Mother 24       Deceased   CVA Mother    Seizures Father 23       Deceased from complications with seizures      Prior to Admission medications   Medication Sig Start Date End Date Taking? Authorizing Provider  atorvastatin (LIPITOR) 80 MG tablet Take 80 mg by mouth daily.    [provider]  carvedilol (COREG) 3.125 MG tablet Take 1 tablet (3.125 mg total) by mouth 2 (two) times daily with a meal. 07/19/20   Shawna Clamp, MD  clopidogrel (PLAVIX) 75 MG tablet Take 1 tablet (75 mg total) by mouth daily with breakfast. 07/20/20   Shawna Clamp, MD  empagliflozin (JARDIANCE) 25 MG TABS tablet Take 25 mg by mouth daily.    [provider]  ezetimibe (ZETIA) 10 MG tablet Take 1 tablet (10 mg total) by mouth daily. 09/11/19   Fritzi Mandes, MD  gabapentin (NEURONTIN) 100 MG capsule Take 100 mg by mouth 3 (three) times daily.    [provider]  glipiZIDE (GLUCOTROL) 10 MG tablet Take 10 mg by mouth 2 (two) times daily before a meal.    [provider]  guaiFENesin-dextromethorphan (ROBITUSSIN DM) 100-10 MG/5ML syrup Take 5 mLs by mouth every 4 (four) hours as needed for cough. 07/16/19   Thornell Mule, MD  isosorbide mononitrate (IMDUR) 30 MG 24 hr tablet Take 1 tablet (30 mg total) by mouth daily. 12/14/19   Mikhail, Velta Addison, DO  metFORMIN (GLUMETZA) 500 MG (MOD) 24  hr tablet Take 500 mg by mouth 2 (two) times daily with a meal.    [provider]  morphine (MS CONTIN) 15 MG 12 hr tablet Take 15 mg by mouth every 12 (twelve) hours as needed for pain.    [provider]  Multiple Vitamin (MULTIVITAMIN WITH MINERALS) TABS tablet Take 1 tablet by mouth daily. 08/10/20   Fritzi Mandes, MD  nitroGLYCERIN (NITROSTAT) 0.4 MG SL tablet Place 1 tablet (0.4 mg total) under the tongue every 5 (five) minutes as needed for chest pain. 07/19/20   Shawna Clamp, MD  Nutritional Supplements (,FEEDING SUPPLEMENT, PROSOURCE PLUS) liquid Take 30 mLs by mouth 2 (two) times daily between meals. 08/09/20   Fritzi Mandes, MD  spironolactone (ALDACTONE) 25 MG tablet Take 1 tablet (25 mg total) by mouth daily. 09/11/19   Fritzi Mandes, MD    Physical Exam: Vitals:  09/03/20 0914 09/03/20 0917 09/03/20 1030  BP:  (!) 154/97 117/65  Pulse:  (!) 104 89  Resp:  (!) 26 18  Temp:  98.1 F (36.7 C)   TempSrc:  Oral   SpO2:  98% 96%  Weight: 72.6 kg    Height: 6' (1.829 m)       Vitals:   09/03/20 0914 09/03/20 0917 09/03/20 1030  BP:  (!) 154/97 117/65  Pulse:  (!) 104 89  Resp:  (!) 26 18  Temp:  98.1 F (36.7 C)   TempSrc:  Oral   SpO2:  98% 96%  Weight: 72.6 kg    Height: 6' (1.829 m)        Constitutional: Alert and oriented x 3 .  Appears uncomfortable  HEENT:      Head: Normocephalic and atraumatic.         Eyes: PERLA, EOMI, Conjunctivae are normal. Sclera is non-icteric.       Mouth/Throat: Mucous membranes are moist.       Neck: Supple with no signs of meningismus. Cardiovascular: Regular rate and rhythm. No murmurs, gallops, or rubs. 2+ symmetrical distal pulses are present . No JVD. No LE edema Respiratory: Respiratory effort normal .Lungs sounds clear bilaterally. No wheezes, crackles, or rhonchi.  Gastrointestinal: Soft,  tender left lower quadrant, and non distended with positive bowel sounds.  Genitourinary: No CVA  tenderness. Musculoskeletal: Nontender with normal range of motion in all extremities. No cyanosis, or erythema of extremities. Neurologic:  Face is symmetric. Moving all extremities. No gross focal neurologic deficits  Skin: Skin is warm, dry.  No rash or ulcers Psychiatric: Mood and affect are normal   Labs on Admission: I have personally reviewed following labs and imaging studies  CBC: Recent Labs  Lab 09/03/20 0921  WBC 9.3  HGB 19.1*  HCT 54.2*  MCV 93.9  PLT 253   Basic Metabolic Panel: Recent Labs  Lab 09/03/20 0921  NA 129*  K 3.6  CL 95*  CO2 20*  GLUCOSE 159*  BUN 19  CREATININE 1.27*  CALCIUM 9.9   GFR: Estimated Creatinine Clearance: 51.6 mL/min (A) (by C-G formula based on SCr of 1.27 mg/dL (H)). Liver Function Tests: Recent Labs  Lab 09/03/20 0921  AST 25  ALT 25  ALKPHOS 86  BILITOT 2.6*   3.0*  PROT 8.5*  ALBUMIN 4.5   Recent Labs  Lab 09/03/20 0921  LIPASE 27   No results for input(s): AMMONIA in the last 168 hours. Coagulation Profile: No results for input(s): INR, PROTIME in the last 168 hours. Cardiac Enzymes: No results for input(s): CKTOTAL, CKMB, CKMBINDEX, TROPONINI in the last 168 hours. BNP (last 3 results) No results for input(s): PROBNP in the last 8760 hours. HbA1C: No results for input(s): HGBA1C in the last 72 hours. CBG: No results for input(s): GLUCAP in the last 168 hours. Lipid Profile: No results for input(s): CHOL, HDL, LDLCALC, TRIG, CHOLHDL, LDLDIRECT in the last 72 hours. Thyroid Function Tests: No results for input(s): TSH, T4TOTAL, FREET4, T3FREE, THYROIDAB in the last 72 hours. Anemia Panel: No results for input(s): VITAMINB12, FOLATE, FERRITIN, TIBC, IRON, RETICCTPCT in the last 72 hours. Urine analysis:    Component Value Date/Time   COLORURINE YELLOW (A) 09/03/2020 1246   APPEARANCEUR CLEAR (A) 09/03/2020 1246   APPEARANCEUR Clear 10/15/2013 2200   LABSPEC 1.030 09/03/2020 1246   LABSPEC 1.012  10/15/2013 2200   PHURINE 6.0 09/03/2020 1246   GLUCOSEU >=500 (A) 09/03/2020 1246  GLUCOSEU Negative 10/15/2013 2200   HGBUR NEGATIVE 09/03/2020 1246   BILIRUBINUR NEGATIVE 09/03/2020 1246   BILIRUBINUR Negative 10/15/2013 2200   KETONESUR 20 (A) 09/03/2020 1246   PROTEINUR NEGATIVE 09/03/2020 1246   NITRITE NEGATIVE 09/03/2020 1246   LEUKOCYTESUR NEGATIVE 09/03/2020 1246   LEUKOCYTESUR Negative 10/15/2013 2200    Radiological Exams on Admission: CT Angio Chest PE W and/or Wo Contrast  Result Date: 09/03/2020 CLINICAL DATA:  Centralized chest pain radiating to left arm with nausea and vomiting. Recent cardiac stent placement 2 months ago. EXAM: CT ANGIOGRAPHY CHEST CT ABDOMEN AND PELVIS WITH CONTRAST TECHNIQUE: Multidetector CT imaging of the chest was performed using the standard protocol during bolus administration of intravenous contrast. Multiplanar CT image reconstructions and MIPs were obtained to evaluate the vascular anatomy. Multidetector CT imaging of the abdomen and pelvis was performed using the standard protocol during bolus administration of intravenous contrast. CONTRAST:  73mL OMNIPAQUE IOHEXOL 350 MG/ML SOLN COMPARISON:  CT a chest, abdomen, and pelvis dated December 12, 2019. FINDINGS: CTA CHEST FINDINGS Cardiovascular: Satisfactory opacification of the pulmonary arteries to the segmental level. No evidence of pulmonary embolism. Normal heart size. No pericardial effusion. Prior CABG. Coronary, aortic arch, and branch vessel atherosclerotic vascular disease. Mediastinum/Nodes: No enlarged mediastinal, hilar, or axillary lymph nodes. Thyroid gland, trachea, and esophagus demonstrate no significant findings. Lungs/Pleura: Unchanged biapical pleuroparenchymal scarring. No focal consolidation, pleural effusion, or pneumothorax. No suspicious pulmonary nodule. Musculoskeletal: Unchanged mild bilateral gynecomastia. No acute or significant osseous findings. Review of the MIP images  confirms the above findings. CT ABDOMEN AND PELVIS FINDINGS Hepatobiliary: No focal liver abnormality is seen. No gallstones or gallbladder wall thickening. Mild intra- and extrahepatic biliary dilatation. Common bile duct measures 10 mm in diameter. Pancreas: Unchanged mild prominence of the main pancreatic duct. No surrounding inflammatory changes. Spleen: Normal in size without focal abnormality. Adrenals/Urinary Tract: The adrenal glands are unremarkable. Unchanged 11 mm right renal calculus. No hydronephrosis or renal mass. The bladder is unremarkable. Stomach/Bowel: Stomach is within normal limits. Appendix appears normal. No evidence of bowel wall thickening, distention, or inflammatory changes. Vascular/Lymphatic: Aortic atherosclerosis. No enlarged abdominal or pelvic lymph nodes. Reproductive: Prostate is unremarkable. Other: No abdominal wall hernia or abnormality. No abdominopelvic ascites. No pneumoperitoneum. Musculoskeletal: No acute or significant osseous findings. Review of the MIP images confirms the above findings. IMPRESSION: 1. No evidence of pulmonary embolism. No acute intrathoracic process. 2. Mild intra- and extrahepatic biliary dilatation with common bile duct measuring 10 mm in diameter. Correlation with LFTs is recommended. If there is clinical concern for biliary obstruction, consider further evaluation with MRCP or ERCP. 3. Unchanged 11 mm nonobstructive right nephrolithiasis. 4. Aortic Atherosclerosis (ICD10-I70.0). Electronically Signed   By: Titus Dubin M.D.   On: 09/03/2020 12:03   CT ABDOMEN PELVIS W CONTRAST  Result Date: 09/03/2020 CLINICAL DATA:  Centralized chest pain radiating to left arm with nausea and vomiting. Recent cardiac stent placement 2 months ago. EXAM: CT ANGIOGRAPHY CHEST CT ABDOMEN AND PELVIS WITH CONTRAST TECHNIQUE: Multidetector CT imaging of the chest was performed using the standard protocol during bolus administration of intravenous contrast.  Multiplanar CT image reconstructions and MIPs were obtained to evaluate the vascular anatomy. Multidetector CT imaging of the abdomen and pelvis was performed using the standard protocol during bolus administration of intravenous contrast. CONTRAST:  39mL OMNIPAQUE IOHEXOL 350 MG/ML SOLN COMPARISON:  CT a chest, abdomen, and pelvis dated December 12, 2019. FINDINGS: CTA CHEST FINDINGS Cardiovascular: Satisfactory opacification of the pulmonary arteries  to the segmental level. No evidence of pulmonary embolism. Normal heart size. No pericardial effusion. Prior CABG. Coronary, aortic arch, and branch vessel atherosclerotic vascular disease. Mediastinum/Nodes: No enlarged mediastinal, hilar, or axillary lymph nodes. Thyroid gland, trachea, and esophagus demonstrate no significant findings. Lungs/Pleura: Unchanged biapical pleuroparenchymal scarring. No focal consolidation, pleural effusion, or pneumothorax. No suspicious pulmonary nodule. Musculoskeletal: Unchanged mild bilateral gynecomastia. No acute or significant osseous findings. Review of the MIP images confirms the above findings. CT ABDOMEN AND PELVIS FINDINGS Hepatobiliary: No focal liver abnormality is seen. No gallstones or gallbladder wall thickening. Mild intra- and extrahepatic biliary dilatation. Common bile duct measures 10 mm in diameter. Pancreas: Unchanged mild prominence of the main pancreatic duct. No surrounding inflammatory changes. Spleen: Normal in size without focal abnormality. Adrenals/Urinary Tract: The adrenal glands are unremarkable. Unchanged 11 mm right renal calculus. No hydronephrosis or renal mass. The bladder is unremarkable. Stomach/Bowel: Stomach is within normal limits. Appendix appears normal. No evidence of bowel wall thickening, distention, or inflammatory changes. Vascular/Lymphatic: Aortic atherosclerosis. No enlarged abdominal or pelvic lymph nodes. Reproductive: Prostate is unremarkable. Other: No abdominal wall hernia or  abnormality. No abdominopelvic ascites. No pneumoperitoneum. Musculoskeletal: No acute or significant osseous findings. Review of the MIP images confirms the above findings. IMPRESSION: 1. No evidence of pulmonary embolism. No acute intrathoracic process. 2. Mild intra- and extrahepatic biliary dilatation with common bile duct measuring 10 mm in diameter. Correlation with LFTs is recommended. If there is clinical concern for biliary obstruction, consider further evaluation with MRCP or ERCP. 3. Unchanged 11 mm nonobstructive right nephrolithiasis. 4. Aortic Atherosclerosis (ICD10-I70.0). Electronically Signed   By: Titus Dubin M.D.   On: 09/03/2020 12:03   DG Chest Portable 1 View  Result Date: 09/03/2020 CLINICAL DATA:  Shortness of breath, left-sided chest pain EXAM: PORTABLE CHEST 1 VIEW COMPARISON:  08/07/2020 FINDINGS: Cardiac shadow is within normal limits. Postsurgical changes are again seen. Lungs are well aerated bilaterally. No focal infiltrate or effusion is seen. No acute bony abnormality is noted. Postsurgical changes in the cervical spine and proximal left humerus are noted. IMPRESSION: No acute abnormality seen. Electronically Signed   By: Inez Catalina M.D.   On: 09/03/2020 09:56     Assessment/Plan Principal Problem:   Chest pain Active Problems:   Type 2 diabetes mellitus (HCC)   Essential hypertension   PAF (paroxysmal atrial fibrillation) (HCC)   History of pancreatitis     Chest pain In a patient with known coronary artery disease status post CABG and recent stent angioplasty Cardiac enzymes are negative and EKG does not show any acute findings but patient continues to have pain. Continue nitrates as needed and IV morphine as needed for pain Continue Plavix, beta-blockers and statins We will consult cardiology     History of pancreatitis Patient was recently hospitalized for acute pancreatitis He presents today for evaluation of chest pain which is mostly in the  lower sternal area associated with nausea and vomiting CT scan of abdomen pelvis shows a dilated common bile duct and he has elevated total bilirubin level with normal liver enzymes.  Lipase is within normal limits as well We will keep patient n.p.o. from GI has been consulted Pain control IV PPI     Hypertension Optimize blood pressure control Continue carvedilol, spironolactone and hydralazine    Diabetes mellitus Keep patient n.p.o. for now Hold oral hypoglycemic agents IV fluid hydration Sliding scale coverage with insulin.  Accu-Cheks every 4 hours   History of paroxysmal A. Fib  Continue carvedilol for rate control Patient was on Xarelto which was discontinued after his last stent angioplasty.    Hyponatremia Most likely related to Spironolactone Monitor sodium levels    DVT prophylaxis: Lovenox Code Status: full code Family Communication: Greater than 50% of time was spent discussing plan of care with patient.  All questions and concerns have been addressed.  He verbalizes understanding and agrees with the plan. Disposition Plan: Back to previous home environment Consults called: Cardiology/gastroenterology Status: At the time of admission, it appears the appropriate admission status for this patient is inpatient.  This is judged to be reasonable and necessary in order to provide the required intensity of service to ensure the patient's safety given the presenting symptoms, physical exam findings and initial radiographic and laboratory data in the context of their comorbid conditions. Patient requires inpatient status due to high intensity of service, high risk for further deterioration and high frequency of surveillance required.    Collier Bullock MD [facility]     09/03/2020, 1:48 PM

## 2020-09-03 NOTE — Consult Note (Signed)
Cephas Darby, MD [address]  [place]  [place], [place] [address]  Main: [phone]  Fax: [phone] Pager: [phone]   Consultation  Referring Provider:     No ref. provider found Primary Care Physician:  [name], MD Primary Gastroenterologist: Althia Forts         Reason for Consultation:     Dilated CBD  Date of Admission:  09/03/2020 Date of Consultation:  09/03/2020         HPI:   Bryan Scott is a 75 y.o. male with known history of coronary disease s/p CABG (recent admission for NSTEMI s/p PCI with DES to [facility] 07/13/2020), PAF previously on Xarelto, currently on Plavix.  Patient had acute uncomplicated pancreatitis of unclear etiology, was admitted at [facility] on 2/22, discharged on 2/24.  He had a right upper quadrant ultrasound which was unremarkable, no evidence of cholelithiasis or biliary dilation/obstruction.  He presented to ER earlier this morning as he started experiencing chest pain, radiating to left arm associated with nausea and vomiting after eating breakfast.  He is also complaining of pain in his suprapubic area.  His EKG was unremarkable in the ER, troponin x2 negative, lipase normal, mild AKI, LFTs revealed isolated elevated total bilirubin, AST, ALT and alkaline phosphatase were normal, no leukocytosis.  CT angio PE protocol was negative for PE.  He underwent CT abdomen pelvis with contrast which revealed mild intra and hip extrahepatic biliary dilation with CBD measuring 10 mm. Patient's last dose of Plavix was yesterday morning.  Patient denied any fever, chills.  NSAIDs: None  Antiplts/Anticoagulants/Anti thrombotics: Plavix for history of coronary artery disease s/p stent placement on 07/13/2020  GI Procedures: None  Past Medical History:  Diagnosis Date  . 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 Cancer ([place])   . Carotid artery disease (HCC)    > 75% bilateral ICA stenoses on 01/2013 CT  . Chronic pain syndrome   .  Coronary artery disease    s/p CABG ~ 2007  . GERD (gastroesophageal reflux disease)   . Heparin allergy    Bleeding  . History of tobacco use   . Hyperlipidemia   . Hypertension   . Ischemic cardiomyopathy   . Paroxysmal atrial fibrillation (HCC)    On Xarelto anticoagulation  . Type 2 diabetes mellitus (HCC)    A1C 6.8% in 01/2013    Past Surgical History:  Procedure Laterality Date  . CARDIAC CATHETERIZATION    . CORONARY ARTERY BYPASS GRAFT  2007  . CORONARY STENT INTERVENTION N/A 07/13/2020   Procedure: CORONARY STENT INTERVENTION;  Surgeon: Nelva Bush, MD;  Location: [facility];  Service: Cardiovascular;  Laterality: N/A;  . LEFT HEART CATH AND CORS/GRAFTS ANGIOGRAPHY N/A 07/13/2020   Procedure: LEFT HEART CATH AND CORS/GRAFTS ANGIOGRAPHY;  Surgeon: Minna Merritts, MD;  Location: [facility];  Service: Cardiovascular;  Laterality: N/A;    Prior to Admission medications   Medication Sig Start Date End Date Taking? Authorizing Provider  atorvastatin (LIPITOR) 80 MG tablet Take 80 mg by mouth daily.    [provider]  carvedilol (COREG) 3.125 MG tablet Take 1 tablet (3.125 mg total) by mouth 2 (two) times daily with a meal. 07/19/20   Shawna Clamp, MD  clopidogrel (PLAVIX) 75 MG tablet Take 1 tablet (75 mg total) by mouth daily with breakfast. 07/20/20   Shawna Clamp, MD  empagliflozin (JARDIANCE) 25 MG TABS tablet Take 25 mg by  mouth daily.    [provider]  ezetimibe (ZETIA) 10 MG tablet Take 1 tablet (10 mg total) by mouth daily. 09/11/19   Fritzi Mandes, MD  gabapentin (NEURONTIN) 100 MG capsule Take 100 mg by mouth 3 (three) times daily.    [provider]  glipiZIDE (GLUCOTROL) 10 MG tablet Take 10 mg by mouth 2 (two) times daily before a meal.    [provider]  guaiFENesin-dextromethorphan (ROBITUSSIN DM) 100-10 MG/5ML syrup Take 5 mLs by mouth every 4 (four) hours as needed for cough. 07/16/19   Thornell Mule,  MD  isosorbide mononitrate (IMDUR) 30 MG 24 hr tablet Take 1 tablet (30 mg total) by mouth daily. 12/14/19   Mikhail, Velta Addison, DO  metFORMIN (GLUMETZA) 500 MG (MOD) 24 hr tablet Take 500 mg by mouth 2 (two) times daily with a meal.    [provider]  morphine (MS CONTIN) 15 MG 12 hr tablet Take 15 mg by mouth every 12 (twelve) hours as needed for pain.    [provider]  Multiple Vitamin (MULTIVITAMIN WITH MINERALS) TABS tablet Take 1 tablet by mouth daily. 08/10/20   Fritzi Mandes, MD  nitroGLYCERIN (NITROSTAT) 0.4 MG SL tablet Place 1 tablet (0.4 mg total) under the tongue every 5 (five) minutes as needed for chest pain. 07/19/20   Shawna Clamp, MD  Nutritional Supplements (,FEEDING SUPPLEMENT, PROSOURCE PLUS) liquid Take 30 mLs by mouth 2 (two) times daily between meals. 08/09/20   Fritzi Mandes, MD  spironolactone (ALDACTONE) 25 MG tablet Take 1 tablet (25 mg total) by mouth daily. 09/11/19   Fritzi Mandes, MD    Current Facility-Administered Medications:  .  0.9 %  sodium chloride infusion, , Intravenous, Continuous, Agbata, Tochukwu, MD .  atorvastatin (LIPITOR) tablet 80 mg, 80 mg, Oral, Daily, Agbata, Tochukwu, MD .  carvedilol (COREG) tablet 3.125 mg, 3.125 mg, Oral, BID WC, Agbata, Tochukwu, MD .  Derrill Memo ON 09/04/2020] clopidogrel (PLAVIX) tablet 75 mg, 75 mg, Oral, Q breakfast, Agbata, Tochukwu, MD .  ezetimibe (ZETIA) tablet 10 mg, 10 mg, Oral, Daily, Agbata, Tochukwu, MD .  fentaNYL (SUBLIMAZE) injection 50 mcg, 50 mcg, Intravenous, Q1H PRN, Merlyn Lot, MD, 50 mcg at 09/03/20 1314 .  gabapentin (NEURONTIN) capsule 100 mg, 100 mg, Oral, TID, Agbata, Tochukwu, MD .  insulin aspart (novoLOG) injection 0-9 Units, 0-9 Units, Subcutaneous, Q4H, Agbata, Tochukwu, MD .  isosorbide mononitrate (IMDUR) 24 hr tablet 30 mg, 30 mg, Oral, Daily, Agbata, Tochukwu, MD .  morphine 4 MG/ML injection 4 mg, 4 mg, Intravenous, Q3H PRN, Merlyn Lot, MD, 4 mg at 09/03/20 1026 .   multivitamin with minerals tablet 1 tablet, 1 tablet, Oral, Daily, Agbata, Tochukwu, MD .  nitroGLYCERIN (NITROSTAT) SL tablet 0.4 mg, 0.4 mg, Sublingual, Q5 min PRN, Agbata, Tochukwu, MD .  ondansetron (ZOFRAN) tablet 4 mg, 4 mg, Oral, Q6H PRN **OR** ondansetron (ZOFRAN) injection 4 mg, 4 mg, Intravenous, Q6H PRN, Agbata, Tochukwu, MD .  pantoprazole (PROTONIX) injection 40 mg, 40 mg, Intravenous, Q24H, Agbata, Tochukwu, MD .  spironolactone (ALDACTONE) tablet 25 mg, 25 mg, Oral, Daily, Agbata, Tochukwu, MD  Current Outpatient Medications:  .  atorvastatin (LIPITOR) 80 MG tablet, Take 80 mg by mouth daily., Disp: , Rfl:  .  carvedilol (COREG) 3.125 MG tablet, Take 1 tablet (3.125 mg total) by mouth 2 (two) times daily with a meal., Disp: 60 tablet, Rfl: 1 .  clopidogrel (PLAVIX) 75 MG tablet, Take 1 tablet (75 mg total) by mouth daily with breakfast.,  Disp: 60 tablet, Rfl: 1 .  empagliflozin (JARDIANCE) 25 MG TABS tablet, Take 25 mg by mouth daily., Disp: , Rfl:  .  ezetimibe (ZETIA) 10 MG tablet, Take 1 tablet (10 mg total) by mouth daily., Disp: 30 tablet, Rfl: 0 .  gabapentin (NEURONTIN) 100 MG capsule, Take 100 mg by mouth 3 (three) times daily., Disp: , Rfl:  .  glipiZIDE (GLUCOTROL) 10 MG tablet, Take 10 mg by mouth 2 (two) times daily before a meal., Disp: , Rfl:  .  guaiFENesin-dextromethorphan (ROBITUSSIN DM) 100-10 MG/5ML syrup, Take 5 mLs by mouth every 4 (four) hours as needed for cough., Disp: 118 mL, Rfl: 0 .  isosorbide mononitrate (IMDUR) 30 MG 24 hr tablet, Take 1 tablet (30 mg total) by mouth daily., Disp: 30 tablet, Rfl: 0 .  metFORMIN (GLUMETZA) 500 MG (MOD) 24 hr tablet, Take 500 mg by mouth 2 (two) times daily with a meal., Disp: , Rfl:  .  morphine (MS CONTIN) 15 MG 12 hr tablet, Take 15 mg by mouth every 12 (twelve) hours as needed for pain., Disp: , Rfl:  .  Multiple Vitamin (MULTIVITAMIN WITH MINERALS) TABS tablet, Take 1 tablet by mouth daily., Disp: 30 tablet, Rfl: 0 .   nitroGLYCERIN (NITROSTAT) 0.4 MG SL tablet, Place 1 tablet (0.4 mg total) under the tongue every 5 (five) minutes as needed for chest pain., Disp: 30 tablet, Rfl: 1 .  Nutritional Supplements (,FEEDING SUPPLEMENT, PROSOURCE PLUS) liquid, Take 30 mLs by mouth 2 (two) times daily between meals., Disp: 887 mL, Rfl: 0 .  spironolactone (ALDACTONE) 25 MG tablet, Take 1 tablet (25 mg total) by mouth daily., Disp: 30 tablet, Rfl: 0  Family History  Problem Relation Age of Onset  . Heart disease Mother 62       Deceased  . CVA Mother   . Seizures Father 54       Deceased from complications with seizures     Social History   Tobacco Use  . Smoking status: Former Smoker    Packs/day: 1.00    Years: 30.00    Pack years: 30.00    Types: Cigarettes  . Smokeless tobacco: Never Used  . Tobacco comment: 30 pack-year history. Quit 8-9 years ago.   Vaping Use  . Vaping Use: Never used  Substance Use Topics  . Alcohol use: No  . Drug use: No    Allergies as of 09/03/2020 - Review Complete 09/03/2020  Allergen Reaction Noted  . Aspergum [aspirin] Anaphylaxis 07/30/2015  . Heparin Other (See Comments) 07/19/2012  . Lisinopril Swelling 07/19/2012    Review of Systems:    All systems reviewed and negative except where noted in HPI.   Physical Exam:  Vital signs in last 24 hours: Temp:  [98.1 F (36.7 C)] 98.1 F (36.7 C) (03/21 0917) Pulse Rate:  [89-104] 89 (03/21 1030) Resp:  [18-26] 18 (03/21 1030) BP: (117-154)/(65-97) 117/65 (03/21 1030) SpO2:  [96 %-98 %] 96 % (03/21 1030) Weight:  [72.6 kg] 72.6 kg (03/21 0914)   General:   Pleasant, cooperative in NAD Head:  Normocephalic and atraumatic. Eyes:   No icterus.   Conjunctiva pink. PERRLA. Ears:  Normal auditory acuity. Neck:  Supple; no masses or thyroidomegaly Lungs: Respirations even and unlabored. Lungs clear to auscultation bilaterally.   No wheezes, crackles, or rhonchi.  Heart:  Regular rate and rhythm;  Without murmur,  clicks, rubs or gallops Abdomen:  Soft, nondistended, tenderness in suprapubic area, right upper quadrant area. Normal  bowel sounds. No appreciable masses or hepatomegaly.  No rebound or guarding.  Rectal:  Not performed. Msk:  Symmetrical without gross deformities.  Strength generalized weakness Extremities:  Without edema, cyanosis or clubbing. Neurologic:  Alert and oriented x3;  grossly normal neurologically. Skin:  Intact without significant lesions or rashes. Psych:  Alert and cooperative. Normal affect.  LAB RESULTS: CBC Latest Ref Rng & Units 09/03/2020 08/08/2020 08/07/2020  WBC 4.0 - 10.5 K/uL 9.3 12.1(H) 10.9(H)  Hemoglobin 13.0 - 17.0 g/dL 19.1(H) 16.0 16.4  Hematocrit 39.0 - 52.0 % 54.2(H) 46.2 45.8  Platelets 150 - 400 K/uL 224 185 207    BMET BMP Latest Ref Rng & Units 09/03/2020 08/08/2020 08/07/2020  Glucose 70 - 99 mg/dL 159(H) 168(H) 128(H)  BUN 8 - 23 mg/dL 19 24(H) 30(H)  Creatinine 0.61 - 1.24 mg/dL 1.27(H) 1.08 1.29(H)  Sodium 135 - 145 mmol/L 129(L) 129(L) 129(L)  Potassium 3.5 - 5.1 mmol/L 3.6 4.1 3.3(L)  Chloride 98 - 111 mmol/L 95(L) 101 96(L)  CO2 22 - 32 mmol/L 20(L) 16(L) 20(L)  Calcium 8.9 - 10.3 mg/dL 9.9 8.7(L) 9.6    LFT Hepatic Function Latest Ref Rng & Units 09/03/2020 09/03/2020 08/08/2020  Total Protein 6.5 - 8.1 g/dL - 8.5(H) 7.3  Albumin 3.5 - 5.0 g/dL - 4.5 3.6  AST 15 - 41 U/L - 25 24  ALT 0 - 44 U/L - 25 24  Alk Phosphatase 38 - 126 U/L - 86 62  Total Bilirubin 0.3 - 1.2 mg/dL 2.6(H) 3.0(H) 3.1(H)  Bilirubin, Direct 0.0 - 0.2 mg/dL <0.1 - -     STUDIES: CT Angio Chest PE W and/or Wo Contrast  Result Date: 09/03/2020 CLINICAL DATA:  Centralized chest pain radiating to left arm with nausea and vomiting. Recent cardiac stent placement 2 months ago. EXAM: CT ANGIOGRAPHY CHEST CT ABDOMEN AND PELVIS WITH CONTRAST TECHNIQUE: Multidetector CT imaging of the chest was performed using the standard protocol during bolus administration of intravenous  contrast. Multiplanar CT image reconstructions and MIPs were obtained to evaluate the vascular anatomy. Multidetector CT imaging of the abdomen and pelvis was performed using the standard protocol during bolus administration of intravenous contrast. CONTRAST:  9m OMNIPAQUE IOHEXOL 350 MG/ML SOLN COMPARISON:  CT a chest, abdomen, and pelvis dated December 12, 2019. FINDINGS: CTA CHEST FINDINGS Cardiovascular: Satisfactory opacification of the pulmonary arteries to the segmental level. No evidence of pulmonary embolism. Normal heart size. No pericardial effusion. Prior CABG. Coronary, aortic arch, and branch vessel atherosclerotic vascular disease. Mediastinum/Nodes: No enlarged mediastinal, hilar, or axillary lymph nodes. Thyroid gland, trachea, and esophagus demonstrate no significant findings. Lungs/Pleura: Unchanged biapical pleuroparenchymal scarring. No focal consolidation, pleural effusion, or pneumothorax. No suspicious pulmonary nodule. Musculoskeletal: Unchanged mild bilateral gynecomastia. No acute or significant osseous findings. Review of the MIP images confirms the above findings. CT ABDOMEN AND PELVIS FINDINGS Hepatobiliary: No focal liver abnormality is seen. No gallstones or gallbladder wall thickening. Mild intra- and extrahepatic biliary dilatation. Common bile duct measures 10 mm in diameter. Pancreas: Unchanged mild prominence of the main pancreatic duct. No surrounding inflammatory changes. Spleen: Normal in size without focal abnormality. Adrenals/Urinary Tract: The adrenal glands are unremarkable. Unchanged 11 mm right renal calculus. No hydronephrosis or renal mass. The bladder is unremarkable. Stomach/Bowel: Stomach is within normal limits. Appendix appears normal. No evidence of bowel wall thickening, distention, or inflammatory changes. Vascular/Lymphatic: Aortic atherosclerosis. No enlarged abdominal or pelvic lymph nodes. Reproductive: Prostate is unremarkable. Other: No abdominal wall  hernia or abnormality.  No abdominopelvic ascites. No pneumoperitoneum. Musculoskeletal: No acute or significant osseous findings. Review of the MIP images confirms the above findings. IMPRESSION: 1. No evidence of pulmonary embolism. No acute intrathoracic process. 2. Mild intra- and extrahepatic biliary dilatation with common bile duct measuring 10 mm in diameter. Correlation with LFTs is recommended. If there is clinical concern for biliary obstruction, consider further evaluation with MRCP or ERCP. 3. Unchanged 11 mm nonobstructive right nephrolithiasis. 4. Aortic Atherosclerosis (ICD10-I70.0). Electronically Signed   By: Titus Dubin M.D.   On: 09/03/2020 12:03   CT ABDOMEN PELVIS W CONTRAST  Result Date: 09/03/2020 CLINICAL DATA:  Centralized chest pain radiating to left arm with nausea and vomiting. Recent cardiac stent placement 2 months ago. EXAM: CT ANGIOGRAPHY CHEST CT ABDOMEN AND PELVIS WITH CONTRAST TECHNIQUE: Multidetector CT imaging of the chest was performed using the standard protocol during bolus administration of intravenous contrast. Multiplanar CT image reconstructions and MIPs were obtained to evaluate the vascular anatomy. Multidetector CT imaging of the abdomen and pelvis was performed using the standard protocol during bolus administration of intravenous contrast. CONTRAST:  59m OMNIPAQUE IOHEXOL 350 MG/ML SOLN COMPARISON:  CT a chest, abdomen, and pelvis dated December 12, 2019. FINDINGS: CTA CHEST FINDINGS Cardiovascular: Satisfactory opacification of the pulmonary arteries to the segmental level. No evidence of pulmonary embolism. Normal heart size. No pericardial effusion. Prior CABG. Coronary, aortic arch, and branch vessel atherosclerotic vascular disease. Mediastinum/Nodes: No enlarged mediastinal, hilar, or axillary lymph nodes. Thyroid gland, trachea, and esophagus demonstrate no significant findings. Lungs/Pleura: Unchanged biapical pleuroparenchymal scarring. No focal  consolidation, pleural effusion, or pneumothorax. No suspicious pulmonary nodule. Musculoskeletal: Unchanged mild bilateral gynecomastia. No acute or significant osseous findings. Review of the MIP images confirms the above findings. CT ABDOMEN AND PELVIS FINDINGS Hepatobiliary: No focal liver abnormality is seen. No gallstones or gallbladder wall thickening. Mild intra- and extrahepatic biliary dilatation. Common bile duct measures 10 mm in diameter. Pancreas: Unchanged mild prominence of the main pancreatic duct. No surrounding inflammatory changes. Spleen: Normal in size without focal abnormality. Adrenals/Urinary Tract: The adrenal glands are unremarkable. Unchanged 11 mm right renal calculus. No hydronephrosis or renal mass. The bladder is unremarkable. Stomach/Bowel: Stomach is within normal limits. Appendix appears normal. No evidence of bowel wall thickening, distention, or inflammatory changes. Vascular/Lymphatic: Aortic atherosclerosis. No enlarged abdominal or pelvic lymph nodes. Reproductive: Prostate is unremarkable. Other: No abdominal wall hernia or abnormality. No abdominopelvic ascites. No pneumoperitoneum. Musculoskeletal: No acute or significant osseous findings. Review of the MIP images confirms the above findings. IMPRESSION: 1. No evidence of pulmonary embolism. No acute intrathoracic process. 2. Mild intra- and extrahepatic biliary dilatation with common bile duct measuring 10 mm in diameter. Correlation with LFTs is recommended. If there is clinical concern for biliary obstruction, consider further evaluation with MRCP or ERCP. 3. Unchanged 11 mm nonobstructive right nephrolithiasis. 4. Aortic Atherosclerosis (ICD10-I70.0). Electronically Signed   By: WTitus DubinM.D.   On: 09/03/2020 12:03   DG Chest Portable 1 View  Result Date: 09/03/2020 CLINICAL DATA:  Shortness of breath, left-sided chest pain EXAM: PORTABLE CHEST 1 VIEW COMPARISON:  08/07/2020 FINDINGS: Cardiac shadow is within  normal limits. Postsurgical changes are again seen. Lungs are well aerated bilaterally. No focal infiltrate or effusion is seen. No acute bony abnormality is noted. Postsurgical changes in the cervical spine and proximal left humerus are noted. IMPRESSION: No acute abnormality seen. Electronically Signed   By: MInez CatalinaM.D.   On: 09/03/2020 09:56  Impression / Plan:   Bryan Scott is a 76 y.o. male with with history of coronary disease s/p CABG, history of NSTEMI and PCI with DES on 07/13/2020 to SVG RCA, who was previously admitted with acute uncomplicated pancreatitis about a month ago, now presents with chest pain radiating to left arm as well as nausea and vomiting, found to have isolated elevated bilirubin and CT revealed intra and extrahepatic biliary dilation, dilated CBD 10 mm   Isolated elevated bilirubin with dilated CBD with recent history of acute uncomplicated pancreatitis.  No evidence of recurrent acute pancreatitis during this admission.  No evidence of acute cholecystitis.  Isolated elevated bilirubin is unusual for choledocholithiasis.  Therefore, recommend fractionated bilirubin N.p.o. for now, until evaluated by cardiology Okay with clear liquid diet if patient does not demonstrate worsening of abdominal pain Patient probably had acute gallstone pancreatitis during previous admission Monitor LFTs daily Patient is not demonstrating any signs of ascending cholangitis at this time, therefore okay to hold off antibiotics for now.  Therefore, there is no indication indication for urgent ERCP at this time Patient had NSTEMI with PCI and stent placement on 07/13/2020, currently on Plavix.  Therefore, recommend cardiology consultation for preop clearance in order to proceed with ERCP, we would also need recommendation about interruption of Plavix if patient undergoes ERCP  Thank you for involving me in the care of this patient.  GI will follow along with you    LOS: 0 days    Sherri Sear, MD  09/03/2020, 2:33 PM   Note: This dictation was prepared with Dragon dictation along with smaller phrase technology. Any transcriptional errors that result from this process are unintentional.

## 2020-09-03 NOTE — ED Provider Notes (Signed)
[facility] Emergency Department Provider Note    Event Date/Time   First MD Initiated Contact with Patient 09/03/20 [phone]     (approximate)  I have reviewed the triage vital signs and the nursing notes.   HISTORY  Chief Complaint Chest Pain    HPI Bryan Scott is a 75 y.o. male presents to the ER for evaluation of midsternal chest pain nonradiating associated nausea and diaphoresis started this morning.  Describes a pressure and discomfort.  Somewhat relieved with nitro.  Also having multiple episodes of vomiting since arriving.  Denies any shortness of breath.  Denies any abdominal pain.  Been compliant with medications.  Status post recent stent placement for after PCI.  States it feels similar to previous anginal pain.    Past Medical History:  Diagnosis Date  . 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 Cancer ([place])   . Carotid artery disease (HCC)    > 75% bilateral ICA stenoses on 01/2013 CT  . Chronic pain syndrome   . Coronary artery disease    s/p CABG ~ 2007  . GERD (gastroesophageal reflux disease)   . Heparin allergy    Bleeding  . History of tobacco use   . Hyperlipidemia   . Hypertension   . Ischemic cardiomyopathy   . Paroxysmal atrial fibrillation (HCC)    On Xarelto anticoagulation  . Type 2 diabetes mellitus (HCC)    A1C 6.8% in 01/2013   Family History  Problem Relation Age of Onset  . Heart disease Mother 63       Deceased  . CVA Mother   . Seizures Father 61       Deceased from complications with seizures   Past Surgical History:  Procedure Laterality Date  . CARDIAC CATHETERIZATION    . CORONARY ARTERY BYPASS GRAFT  2007  . CORONARY STENT INTERVENTION N/A 07/13/2020   Procedure: CORONARY STENT INTERVENTION;  Surgeon: Nelva Bush, MD;  Location: [facility];  Service: Cardiovascular;  Laterality: N/A;  . LEFT HEART CATH AND CORS/GRAFTS ANGIOGRAPHY N/A 07/13/2020   Procedure: LEFT HEART CATH AND  CORS/GRAFTS ANGIOGRAPHY;  Surgeon: Minna Merritts, MD;  Location: [facility];  Service: Cardiovascular;  Laterality: N/A;   Patient Active Problem List   Diagnosis Date Noted  . Cervical radiculopathy   . Acute pancreatitis 08/07/2020  . Hyperlipidemia associated with type 2 diabetes mellitus ([place]) 08/07/2020  . Syncope 08/07/2020  . NSTEMI (non-ST elevated myocardial infarction) ([place]) 07/11/2020  . AKI (acute kidney injury) ([place]) 07/11/2020  . Acute metabolic encephalopathy 81/27/5170  . GERD (gastroesophageal reflux disease) 09/09/2019  . CAD (coronary artery disease) 09/09/2019  . Chronic pain syndrome 09/09/2019  . Leukocytosis 09/09/2019  . Shortness of breath   . Hyponatremia 09/26/2016  . Chest pain 09/22/2016  . 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  . Abnormal EKG 09/21/2016  . Chronic pain 09/21/2016  . PAF (paroxysmal atrial fibrillation) ([place]) 09/21/2016  . Hypertension associated with diabetes ([place]) 05/02/2016  . Carotid stenosis 05/02/2016  . CAD in native artery   . Atypical chest pain   . Type 2 diabetes mellitus without complication ([place])   . Hyperlipidemia   . Pain of left upper extremity   . Angina pectoris ([place])   . Abdominal pain   . Chest pain, central 03/13/2015  . Hypertensive urgency 03/13/2015  . Spinal stenosis 01/10/2012      Prior to Admission medications   Medication  Sig Start Date End Date Taking? Authorizing Provider  atorvastatin (LIPITOR) 80 MG tablet Take 80 mg by mouth daily.    [provider]  carvedilol (COREG) 3.125 MG tablet Take 1 tablet (3.125 mg total) by mouth 2 (two) times daily with a meal. 07/19/20   Shawna Clamp, MD  clopidogrel (PLAVIX) 75 MG tablet Take 1 tablet (75 mg total) by mouth daily with breakfast. 07/20/20   Shawna Clamp, MD  empagliflozin (JARDIANCE) 25 MG TABS tablet Take 25 mg by mouth daily.    [provider]  ezetimibe (ZETIA) 10 MG tablet Take 1 tablet (10 mg  total) by mouth daily. 09/11/19   Fritzi Mandes, MD  gabapentin (NEURONTIN) 100 MG capsule Take 100 mg by mouth 3 (three) times daily.    [provider]  glipiZIDE (GLUCOTROL) 10 MG tablet Take 10 mg by mouth 2 (two) times daily before a meal.    [provider]  guaiFENesin-dextromethorphan (ROBITUSSIN DM) 100-10 MG/5ML syrup Take 5 mLs by mouth every 4 (four) hours as needed for cough. 07/16/19   Thornell Mule, MD  isosorbide mononitrate (IMDUR) 30 MG 24 hr tablet Take 1 tablet (30 mg total) by mouth daily. 12/14/19   Mikhail, Velta Addison, DO  metFORMIN (GLUMETZA) 500 MG (MOD) 24 hr tablet Take 500 mg by mouth 2 (two) times daily with a meal.    [provider]  morphine (MS CONTIN) 15 MG 12 hr tablet Take 15 mg by mouth every 12 (twelve) hours as needed for pain.    [provider]  Multiple Vitamin (MULTIVITAMIN WITH MINERALS) TABS tablet Take 1 tablet by mouth daily. 08/10/20   Fritzi Mandes, MD  nitroGLYCERIN (NITROSTAT) 0.4 MG SL tablet Place 1 tablet (0.4 mg total) under the tongue every 5 (five) minutes as needed for chest pain. 07/19/20   Shawna Clamp, MD  Nutritional Supplements (,FEEDING SUPPLEMENT, PROSOURCE PLUS) liquid Take 30 mLs by mouth 2 (two) times daily between meals. 08/09/20   Fritzi Mandes, MD  spironolactone (ALDACTONE) 25 MG tablet Take 1 tablet (25 mg total) by mouth daily. 09/11/19   Fritzi Mandes, MD    Allergies Aspergum [aspirin], Heparin, and Lisinopril    Social History Social History   Tobacco Use  . Smoking status: Former Smoker    Packs/day: 1.00    Years: 30.00    Pack years: 30.00    Types: Cigarettes  . Smokeless tobacco: Never Used  . Tobacco comment: 30 pack-year history. Quit 8-9 years ago.   Vaping Use  . Vaping Use: Never used  Substance Use Topics  . Alcohol use: No  . Drug use: No    Review of Systems Patient denies headaches, rhinorrhea, blurry vision, numbness, shortness of breath, chest pain, edema, cough,  abdominal pain, nausea, vomiting, diarrhea, dysuria, fevers, rashes or hallucinations unless otherwise stated above in HPI. ____________________________________________   PHYSICAL EXAM:  VITAL SIGNS: Vitals:   09/03/20 0917 09/03/20 1030  BP: (!) 154/97 117/65  Pulse: (!) 104 89  Resp: (!) 26 18  Temp: 98.1 F (36.7 C)   SpO2: 98% 96%    Constitutional: Alert and oriented.  Eyes: Conjunctivae are normal.  Head: Atraumatic. Nose: No congestion/rhinnorhea. Mouth/Throat: Mucous membranes are moist.   Neck: No stridor. Painless ROM.  Cardiovascular: Normal rate, regular rhythm. Grossly normal heart sounds.  Good peripheral circulation. Respiratory: Normal respiratory effort.  No retractions. Lungs CTAB. Gastrointestinal: Soft and nontender. No distention. No abdominal bruits. No CVA tenderness. Genitourinary: normal ext genitalia Musculoskeletal:  No lower extremity tenderness nor edema.  No joint effusions. Neurologic:  Normal speech and language. No gross focal neurologic deficits are appreciated. No facial droop Skin:  Skin is warm, dry and intact. No rash noted. Psychiatric: Mood and affect are normal. Speech and behavior are normal.  ____________________________________________   LABS (all labs ordered are listed, but only abnormal results are displayed)  Results for orders placed or performed during the hospital encounter of 09/03/20 (from the past 24 hour(s))  CBC     Status: Abnormal   Collection Time: 09/03/20  9:21 AM  Result Value Ref Range   WBC 9.3 4.0 - 10.5 K/uL   RBC 5.77 4.22 - 5.81 MIL/uL   Hemoglobin 19.1 (H) 13.0 - 17.0 g/dL   HCT 54.2 (H) 39.0 - 52.0 %   MCV 93.9 80.0 - 100.0 fL   MCH 33.1 26.0 - 34.0 pg   MCHC 35.2 30.0 - 36.0 g/dL   RDW 12.6 11.5 - 15.5 %   Platelets 224 150 - 400 K/uL   nRBC 0.0 0.0 - 0.2 %  Comprehensive metabolic panel     Status: Abnormal   Collection Time: 09/03/20  9:21 AM  Result Value Ref Range   Sodium 129 (L) 135 - 145  mmol/L   Potassium 3.6 3.5 - 5.1 mmol/L   Chloride 95 (L) 98 - 111 mmol/L   CO2 20 (L) 22 - 32 mmol/L   Glucose, Bld 159 (H) 70 - 99 mg/dL   BUN 19 8 - 23 mg/dL   Creatinine, Ser 1.27 (H) 0.61 - 1.24 mg/dL   Calcium 9.9 8.9 - 10.3 mg/dL   Total Protein 8.5 (H) 6.5 - 8.1 g/dL   Albumin 4.5 3.5 - 5.0 g/dL   AST 25 15 - 41 U/L   ALT 25 0 - 44 U/L   Alkaline Phosphatase 86 38 - 126 U/L   Total Bilirubin 3.0 (H) 0.3 - 1.2 mg/dL   GFR, Estimated 59 (L) >60 mL/min   Anion gap 14 5 - 15  Lipase, blood     Status: None   Collection Time: 09/03/20  9:21 AM  Result Value Ref Range   Lipase 27 11 - 51 U/L  Troponin I (High Sensitivity)     Status: None   Collection Time: 09/03/20  9:21 AM  Result Value Ref Range   Troponin I (High Sensitivity) 5 <18 ng/L  Troponin I (High Sensitivity)     Status: None   Collection Time: 09/03/20 10:19 AM  Result Value Ref Range   Troponin I (High Sensitivity) 7 <18 ng/L   ____________________________________________  EKG My review and personal interpretation at Time: 9:20   Indication: epigastric pain  Rate: 100  Rhythm: sinus Axis: normal Other: normal intervals, no stemi, nonspecific st abn ____________________________________________  RADIOLOGY I personally reviewed all radiographic images ordered to evaluate for the above acute complaints and reviewed radiology reports and findings.  These findings were personally discussed with the patient.  Please see medical record for radiology report.  ____________________________________________   PROCEDURES  Procedure(s) performed:  Procedures    Critical Care performed: no ____________________________________________   INITIAL IMPRESSION / ASSESSMENT AND PLAN / ED COURSE  Pertinent labs & imaging results that were available during my care of the patient were reviewed by me and considered in my medical decision making (see chart for details).   DDX: ACS, pneumonia, gastritis, pancreatitis,  cholelithiasis, cholecystitis, hepatitis, electrolyte abnormality  Bryan Scott is a 75 y.o. who presents  to the ED with presentation as described above.  Patient actively vomiting and ill-appearing upon arrival.  EKG with some nonspecific changes.  No STEMI criteria.  Given his history will order troponin to further evaluate.  Will check blood work.  Clinical Course as of 09/03/20 1245  Mon Sep 03, 2020  1038 Patient's troponin is negative.  Sodium persistently low.  Lipase normal.  Patient does appear hemoconcentrated will give additional IV fluids as well as IV narcotic pain medication.  Will order CT imaging to evaluate for intra-abdominal process. [PR]  1242 Troponins are stable.  Does not seem consistent with ACS.  Does have elevated bilirubin and his CT abdomen does show dilated common bile duct.  No white count no fever.  Based on presentation will discuss with hospitalist for admission.  Do feel like he needs to come in for GI consultation and further evaluation management.  Patient agreeable to plan. [PR]    Clinical Course User Index [PR] Merlyn Lot, MD    The patient was evaluated in Emergency Department today for the symptoms described in the history of present illness. He/she was evaluated in the context of the global COVID-19 pandemic, which necessitated consideration that the patient might be at risk for infection with the SARS-CoV-2 virus that causes COVID-19. Institutional protocols and algorithms that pertain to the evaluation of patients at risk for COVID-19 are in a state of rapid change based on information released by regulatory bodies including the CDC and federal and state organizations. These policies and algorithms were followed during the patient's care in the ED.  As part of my medical decision making, I reviewed the following data within the [facility] notes reviewed and incorporated, Labs reviewed, notes from prior ED visits and [place]  Controlled Substance Database   ____________________________________________   FINAL CLINICAL IMPRESSION(S) / ED DIAGNOSES  Final diagnoses:  Epigastric pain      NEW MEDICATIONS STARTED DURING THIS VISIT:  New Prescriptions   No medications on file     Note:  This document was prepared using Dragon voice recognition software and may include unintentional dictation errors.    Merlyn Lot, MD 09/03/20 1245

## 2020-09-03 NOTE — ED Notes (Signed)
RN asked pt to give a UA. Pt stated he "does not think he can, does not want to try and thinks it is a waste of time."  RN explained importance and handed pt a urinal. Pt stated "it is a waste of time, I can't go."

## 2020-09-03 NOTE — ED Notes (Signed)
Repeat trop not needed per hospitalist

## 2020-09-04 ENCOUNTER — Inpatient Hospital Stay: Payer: Medicare PPO

## 2020-09-04 ENCOUNTER — Inpatient Hospital Stay (HOSPITAL_COMMUNITY)
Admit: 2020-09-04 | Discharge: 2020-09-04 | Disposition: A | Discharge status: Home / Self Care | Payer: Medicare PPO | Attending: Internal Medicine | Admitting: Internal Medicine

## 2020-09-04 DIAGNOSIS — I255 Ischemic cardiomyopathy: Secondary | ICD-10-CM

## 2020-09-04 DIAGNOSIS — E871 Hypo-osmolality and hyponatremia: Secondary | ICD-10-CM

## 2020-09-04 DIAGNOSIS — R109 Unspecified abdominal pain: Secondary | ICD-10-CM

## 2020-09-04 DIAGNOSIS — I2510 Atherosclerotic heart disease of native coronary artery without angina pectoris: Secondary | ICD-10-CM | POA: Diagnosis not present

## 2020-09-04 DIAGNOSIS — E785 Hyperlipidemia, unspecified: Secondary | ICD-10-CM

## 2020-09-04 DIAGNOSIS — I480 Paroxysmal atrial fibrillation: Secondary | ICD-10-CM

## 2020-09-04 DIAGNOSIS — R55 Syncope and collapse: Secondary | ICD-10-CM

## 2020-09-04 LAB — GLUCOSE, CAPILLARY
Glucose-Capillary: 94 mg/dL (ref 70–99)
Glucose-Capillary: 81 mg/dL (ref 70–99)
Glucose-Capillary: 86 mg/dL (ref 70–99)
Glucose-Capillary: 88 mg/dL (ref 70–99)
Glucose-Capillary: 149 mg/dL — ABNORMAL HIGH (ref 70–99)
Glucose-Capillary: 86 mg/dL (ref 70–99)
Glucose-Capillary: 163 mg/dL — ABNORMAL HIGH (ref 70–99)

## 2020-09-04 LAB — CBC
HCT: 46.7 % (ref 39.0–52.0)
Hemoglobin: 16.4 g/dL (ref 13.0–17.0)
MCH: 32.9 pg (ref 26.0–34.0)
MCHC: 35.1 g/dL (ref 30.0–36.0)
MCV: 93.8 fL (ref 80.0–100.0)
Platelets: 204 10*3/uL (ref 150–400)
RBC: 4.98 MIL/uL (ref 4.22–5.81)
RDW: 12.7 % (ref 11.5–15.5)
WBC: 9.7 10*3/uL (ref 4.0–10.5)
nRBC: 0 % (ref 0.0–0.2)

## 2020-09-04 LAB — HEPATIC FUNCTION PANEL
ALT: 19 U/L (ref 0–44)
AST: 18 U/L (ref 15–41)
Albumin: 3.6 g/dL (ref 3.5–5.0)
Alkaline Phosphatase: 64 U/L (ref 38–126)
Bilirubin, Direct: 0.3 mg/dL — ABNORMAL HIGH (ref 0.0–0.2)
Indirect Bilirubin: 2.9 mg/dL — ABNORMAL HIGH (ref 0.3–0.9)
Total Bilirubin: 3.2 mg/dL — ABNORMAL HIGH (ref 0.3–1.2)
Total Protein: 6.5 g/dL (ref 6.5–8.1)

## 2020-09-04 LAB — BASIC METABOLIC PANEL
Anion gap: 13 (ref 5–15)
BUN: 22 mg/dL (ref 8–23)
CO2: 19 mmol/L — ABNORMAL LOW (ref 22–32)
Calcium: 8.9 mg/dL (ref 8.9–10.3)
Chloride: 102 mmol/L (ref 98–111)
Creatinine, Ser: 1.19 mg/dL (ref 0.61–1.24)
GFR, Estimated: >60 mL/min (ref >60)
Glucose, Bld: 83 mg/dL (ref 70–99)
Potassium: 3.2 mmol/L — ABNORMAL LOW (ref 3.5–5.1)
Sodium: 134 mmol/L — ABNORMAL LOW (ref 135–145)

## 2020-09-04 LAB — ECHOCARDIOGRAM COMPLETE
AR max vel: 2.22 cm2
AV Area VTI: 2.16 cm2
AV Area mean vel: 2.17 cm2
AV Mean grad: 4 mmHg
AV Peak grad: 8.6 mmHg
Ao pk vel: 1.47 m/s
Area-P 1/2: 6.96 cm2
Height: 72 in
MV VTI: 2.94 cm2
S' Lateral: 3 cm
Weight: 2803.2 oz

## 2020-09-04 IMAGING — MR MR MRCP
9 of 12 series · 38 of 48 positions shown · non-contrast
Comparison: CT of the abdomen of 1 day prior.

CLINICAL DATA: CT demonstrating mild biliary duct dilatation.
Abdominal pain. Elevated bilirubin.

EXAM:
MRI ABDOMEN WITHOUT CONTRAST  (INCLUDING MRCP)
TECHNIQUE: Multiplanar multisequence MR imaging of the abdomen was performed.
Heavily T2-weighted images of the biliary and pancreatic ducts were
obtained, and three-dimensional MRCP images were rendered by post
processing.

[Series 3: T2 · coronal · 6.0mm · 1.19mm/px · 2 of 30 slices shown (1 of 2)]
[im 1/30]
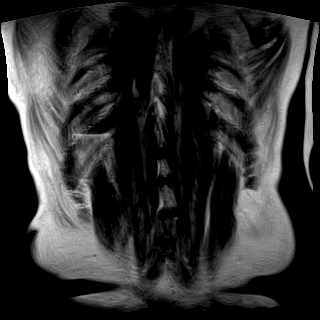
[im 30/30]
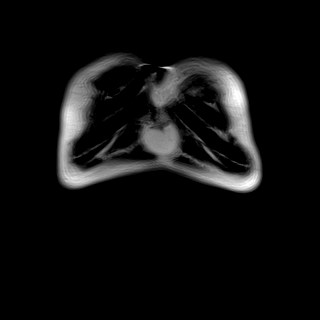

[Series 4: T2 · axial · 6.0mm · 1.19mm/px · z∈[-124,+99]mm · 3 of 32 slices shown (2 of 2)]
[im 1/32]
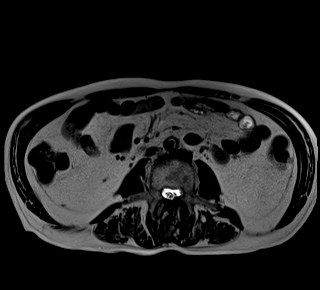
[im 16/32]
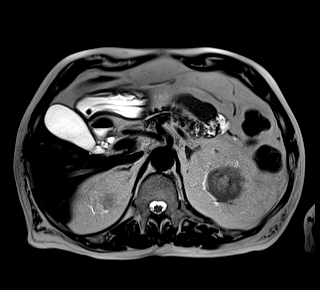
[im 32/32]
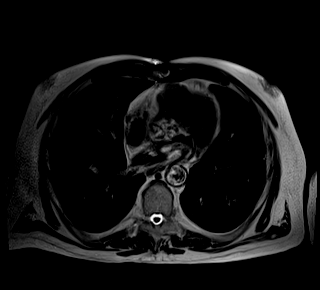

[Series 5: T1 · axial · 6.0mm · 0.74mm/px · z∈[-124,+99]mm · 7 of 64 slices shown]
[im 1/64]
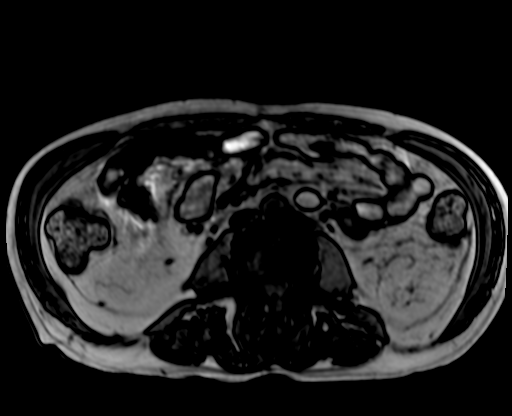
[im 11/64]
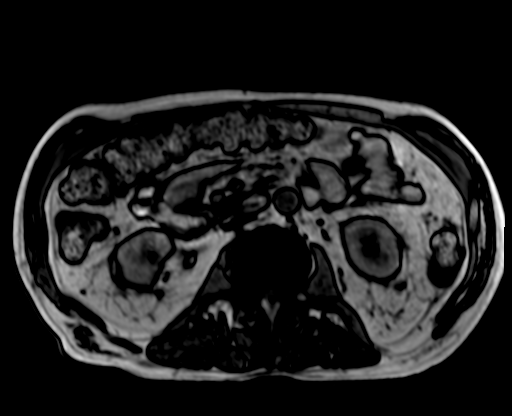
[im 22/64]
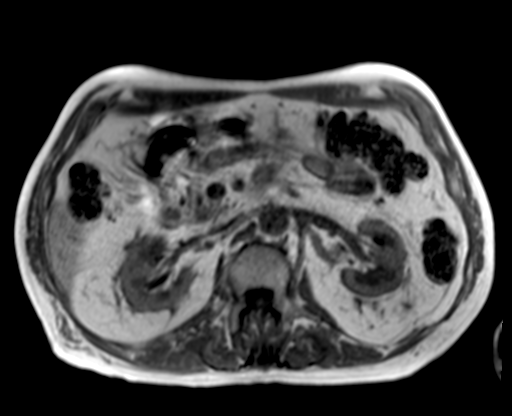
[im 32/64]
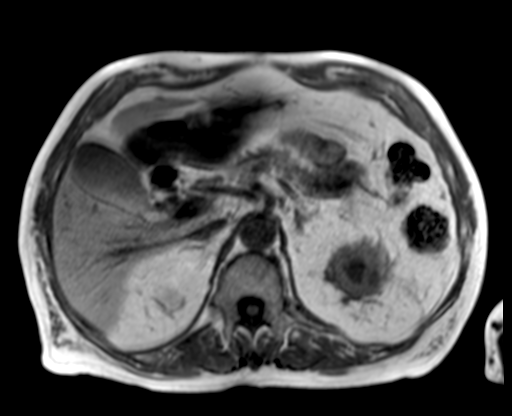
[im 43/64]
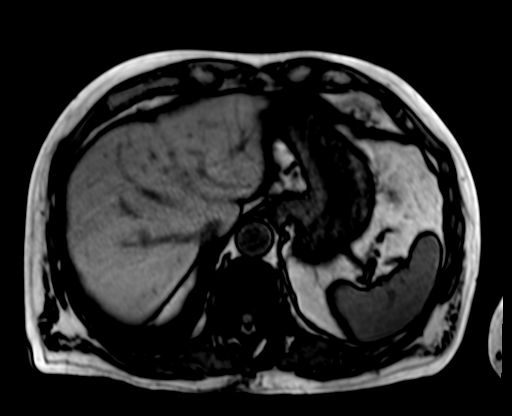
[im 53/64]
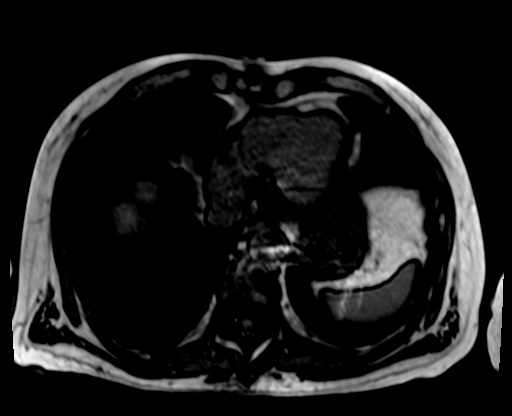
[im 64/64]
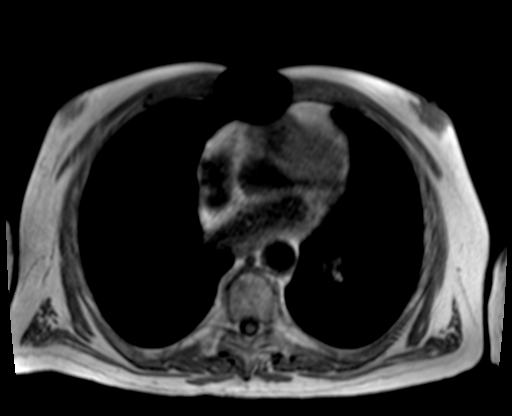

[Series 8: T2 fat-sat · axial · 6.0mm · 1.19mm/px · z∈[-117,+92]mm · 3 of 30 slices shown]
[im 1/30]
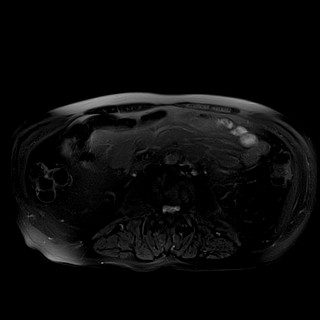
[im 15/30]
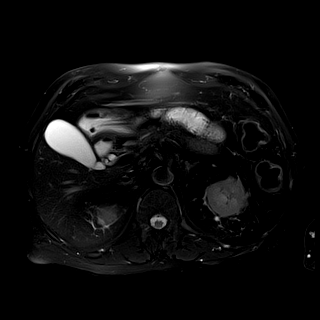
[im 30/30]
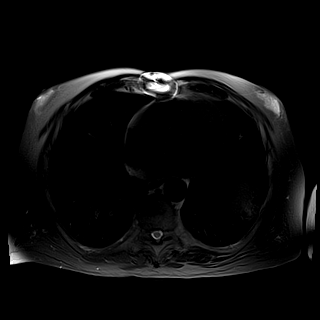

[Series 9: ax dwi_tracew · axial · 6.0mm · 1.42mm/px · z∈[-117,+92]mm · 9 of 90 slices shown]
[im 1/90]
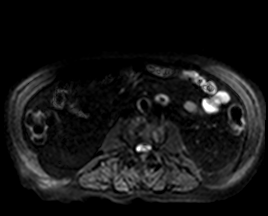
[im 12/90]
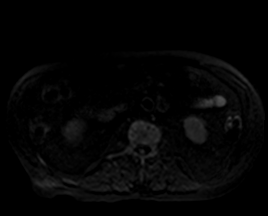
[im 23/90]
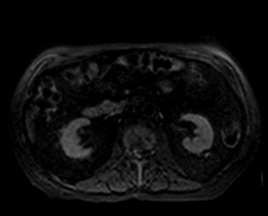
[im 34/90]
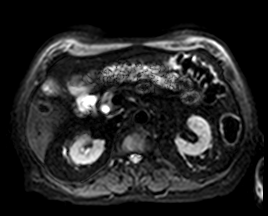
[im 45/90]
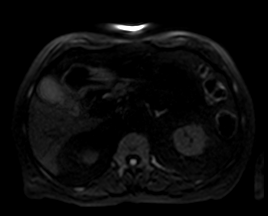
[im 56/90]
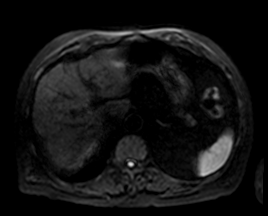
[im 67/90]
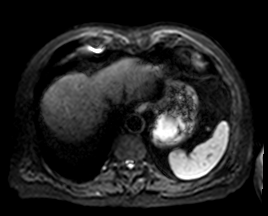
[im 78/90]
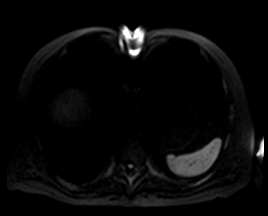
[im 90/90]
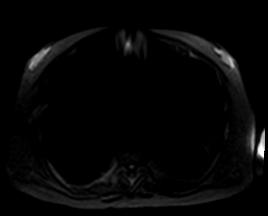

[Series 10: ax dwi_adc · axial · 6.0mm · 1.42mm/px · z∈[-117,+92]mm · 3 of 30 slices shown]
[im 1/30]
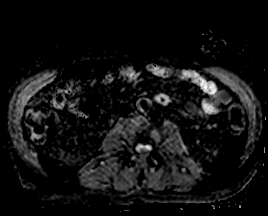
[im 15/30]
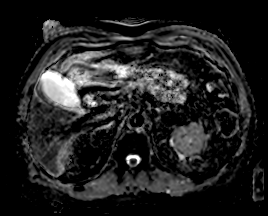
[im 30/30]
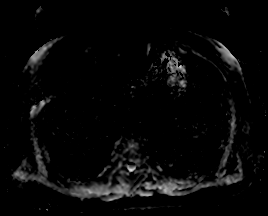

[Series 14: MRCP · coronal · 3.0mm · 1.12mm/px · 2 of 17 slices shown]
[im 1/17]
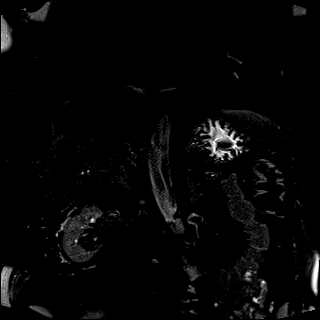
[im 17/17]
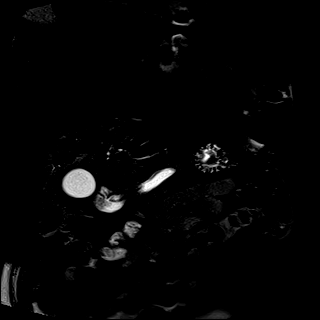

[Series 15: radials · coronal · 50.0mm · 0.78mm/px · 1 of 5 slices shown]
[im 1/5]
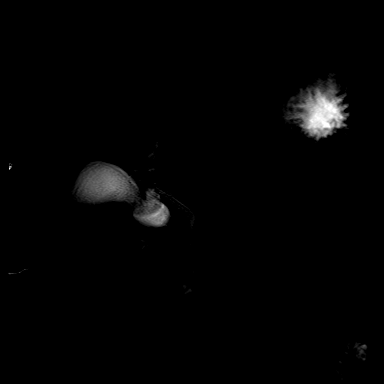

[Series 16: T1 dynamic fat-sat · axial · 3.0mm · 1.19mm/px · z∈[-119,+94]mm · 8 of 72 slices shown]
[im 1/72]
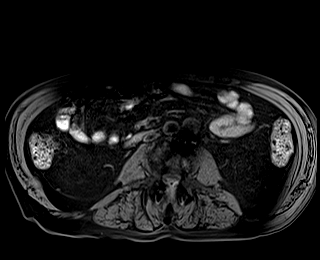
[im 11/72]
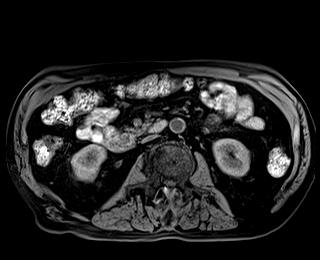
[im 21/72]
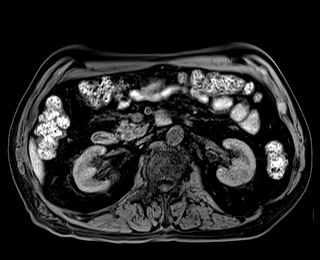
[im 31/72]
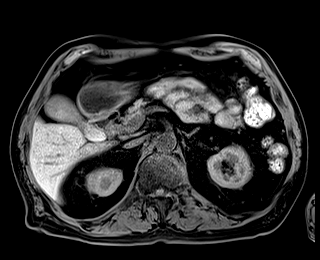
[im 41/72]
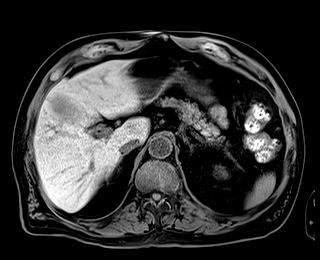
[im 51/72]
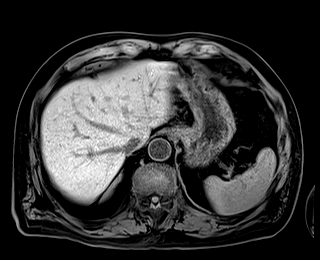
[im 61/72]
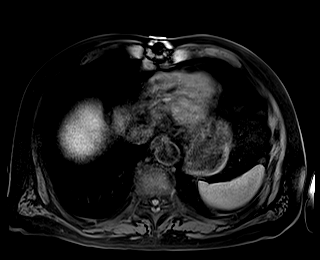
[im 72/72]
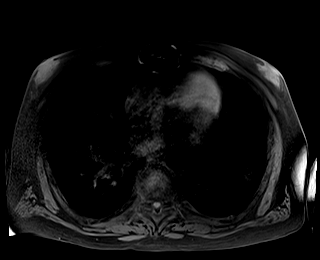

[38 of 48 positions shown; findings below may reference images not displayed]

FINDINGS: Lower chest: Normal heart size without pericardial or pleural
effusion.

Hepatobiliary: Normal noncontrast appearance of the liver. No
gallstones or acute pericholecystic fluid. The intrahepatic ducts
are upper normal, similar. The common duct measures maximally 8 mm
in the porta hepatis on [DATE] versus 9 mm on the prior exam (when
remeasured). Tapers in the region of the pancreatic head, without
obstructive stone or mass.

Pancreas: Borderline prominence of the pancreatic duct is similar
over multiple prior exams. No acute inflammation.

Spleen:  Normal in size, without focal abnormality.

Adrenals/Urinary Tract: Normal adrenal glands. Normal noncontrast
appearance of the kidneys for age, without hydronephrosis.

Stomach/Bowel: Normal stomach and abdominal bowel loops.

Vascular/Lymphatic: Normal aortic caliber. No abdominal adenopathy.

Other:  No ascites.

Musculoskeletal: S shaped thoracolumbar spine curvature.
IMPRESSION: 1. Minimal improvement in common duct caliber, currently upper
normal for age. No obstructive stone or mass. No explanation for
elevated bilirubin.
2.  No acute abdominal process.

## 2020-09-04 MED ORDER — POTASSIUM CHLORIDE CRYS ER 20 MEQ PO TBCR
40.0000 meq | EXTENDED_RELEASE_TABLET | ORAL | Status: AC
  Administered 2020-09-04 (×2): 40 meq via ORAL
  Filled 2020-09-04 (×2): qty 2

## 2020-09-04 MED ORDER — PERFLUTREN LIPID MICROSPHERE
1.0000 mL | INTRAVENOUS | Status: AC | PRN
  Administered 2020-09-04: 3 mL via INTRAVENOUS
  Filled 2020-09-04: qty 10

## 2020-09-04 NOTE — Progress Notes (Addendum)
PROGRESS NOTE    Bryan Scott   SKA:[id number]  DOB: 10/19/1945  DOA: 09/03/2020 PCP: [name], MD   Brief Narrative:  Bryan Scott   Subjective: Symptoms of nausea, vomiting and abdominal pain started in January. He was 178 and is now is about 155 lb. He has pain that starts in his RUQ and goes down to the mid abdominal area. Sometimes he is constipated and sometimes he has diarrhea. He takes ex lax when constipated.   Assessment & Plan:   Principal Problem: Abdominal pain -Likely related to vomiting as outlined below -The patient tells me today his pain starts in the right upper quadrant and radiates down to the mid abdomen and sometimes up to his chest -The pain is acute especially after eating fatty meals -He also has a constant pain going across his abdomen -Highly doubtful that this is cardiac pain-troponin are 5 and 7 -GI work-up underway  Active Problems: Vomiting since January with weight loss of about 20 pounds Elevated T bili -Abdominal pain and vomiting is worse after fatty meals -CT abdomen pelvis with contrast:Mild intra- and extrahepatic biliary dilatation with common bile duct measuring 10 mm in diameter -Appreciate GI eval -MRCP: The intrahepatic ducts are upper normal, similar. The common duct measures maximally 8 mm in the porta hepatis on 16/3 versus 9 mm on the prior exam (when remeasured). Tapers in the region of the pancreatic head, without obstructive stone or mass. -Continue twice daily Protonix possible PUD  -further work-up per GI who is to decide on whether an ERCP is necessary  Coronary artery disease status post stent in January -Continue medical management with Imdur, Plavix, statin and carvedilol    PAF (paroxysmal atrial fibrillation)  -CHA2DS2-VASc score is 6 -We will need to resume Xarelto once GI feels that no further procedures are needed    Hyponatremia/dehydration, mildly elevated creatinine -Due to poor oral  intake -Continue slow normal saline     Type 2 diabetes mellitus  -Is currently n.p.o.-continue sliding scale every 4 hours - Jardiance, glipizide and Metformin Hemoglobin A1C    Component Value Date/Time   HGBA1C 9.5 (H) 07/16/2020 1456   HGBA1C 7.2 (H) 10/16/2013 0452     Time spent in minutes: 3 5 DVT prophylaxis: SCDs  Code Status: Full code Family Communication:  Level of Care: Level of care: Progressive Cardiac Disposition Plan:  Status is: Inpatient  Remains inpatient appropriate because:IV treatments appropriate due to intensity of illness or inability to take PO   Dispo: The patient is from: Home              Anticipated d/c is to: Home              Patient currently is not medically stable to d/c.   Difficult to place patient No      Consultants:   GI  Cardiology Procedures:   MRCP Antimicrobials:  Anti-infectives (From admission, onward)   None       Objective: Vitals:   09/03/20 2104 09/04/20 0404 09/04/20 0740 09/04/20 0900  BP: 133/83 124/78 125/73   Pulse: 79 84 78   Resp: 16 16 18    Temp: 97.7 F (36.5 C) 98 F (36.7 C) 98.2 F (36.8 C)   TempSrc: Oral Oral Oral   SpO2: 97% 96% 97%   Weight:    79.5 kg  Height:        Intake/Output Summary (Last 24 hours) at 09/04/2020 1134 Last data filed at 09/04/2020 1001  Gross per 24 hour  Intake 701.64 ml  Output 700 ml  Net 1.64 ml   Filed Weights   09/03/20 0914 09/03/20 1806 09/04/20 0900  Weight: 72.6 kg 81.2 kg 79.5 kg    Examination: General exam: Appears comfortable  HEENT: PERRLA, oral mucosa moist, no sclera icterus or thrush Respiratory system: Clear to auscultation. Respiratory effort normal. Cardiovascular system: S1 & S2 heard, RRR.   Gastrointestinal system: Abdomen soft, tender in mid abdomen at this time, nondistended. Normal bowel sounds. Central nervous system: Alert and oriented. No focal neurological deficits. Extremities: No cyanosis, clubbing or  edema Skin: No rashes or ulcers Psychiatry:  Mood & affect appropriate.     Data Reviewed: I have personally reviewed following labs and imaging studies  CBC: Recent Labs  Lab 09/03/20 0921 09/04/20 0431  WBC 9.3 9.7  HGB 19.1* 16.4  HCT 54.2* 46.7  MCV 93.9 93.8  PLT 224 132   Basic Metabolic Panel: Recent Labs  Lab 09/03/20 0921 09/04/20 0431  NA 129* 134*  K 3.6 3.2*  CL 95* 102  CO2 20* 19*  GLUCOSE 159* 83  BUN 19 22  CREATININE 1.27* 1.19  CALCIUM 9.9 8.9   GFR: Estimated Creatinine Clearance: 58.9 mL/min (by C-G formula based on SCr of 1.19 mg/dL). Liver Function Tests: Recent Labs  Lab 09/03/20 0921 09/04/20 0431  AST 25 18  ALT 25 19  ALKPHOS 86 64  BILITOT 2.6*  3.0* 3.2*  PROT 8.5* 6.5  ALBUMIN 4.5 3.6   Recent Labs  Lab 09/03/20 0921  LIPASE 27   No results for input(s): AMMONIA in the last 168 hours. Coagulation Profile: No results for input(s): INR, PROTIME in the last 168 hours. Cardiac Enzymes: No results for input(s): CKTOTAL, CKMB, CKMBINDEX, TROPONINI in the last 168 hours. BNP (last 3 results) No results for input(s): PROBNP in the last 8760 hours. HbA1C: No results for input(s): HGBA1C in the last 72 hours. CBG: Recent Labs  Lab 09/03/20 1803 09/03/20 2102 09/03/20 2342 09/04/20 0405 09/04/20 0832  GLUCAP 109* 90 95 94 81   Lipid Profile: No results for input(s): CHOL, HDL, LDLCALC, TRIG, CHOLHDL, LDLDIRECT in the last 72 hours. Thyroid Function Tests: No results for input(s): TSH, T4TOTAL, FREET4, T3FREE, THYROIDAB in the last 72 hours. Anemia Panel: No results for input(s): VITAMINB12, FOLATE, FERRITIN, TIBC, IRON, RETICCTPCT in the last 72 hours. Urine analysis:    Component Value Date/Time   COLORURINE YELLOW (A) 09/03/2020 1246   APPEARANCEUR CLEAR (A) 09/03/2020 1246   APPEARANCEUR Clear 10/15/2013 2200   LABSPEC 1.030 09/03/2020 1246   LABSPEC 1.012 10/15/2013 2200   PHURINE 6.0 09/03/2020 1246    GLUCOSEU >=500 (A) 09/03/2020 1246   GLUCOSEU Negative 10/15/2013 2200   HGBUR NEGATIVE 09/03/2020 1246   BILIRUBINUR NEGATIVE 09/03/2020 1246   BILIRUBINUR Negative 10/15/2013 2200   KETONESUR 20 (A) 09/03/2020 1246   PROTEINUR NEGATIVE 09/03/2020 1246   NITRITE NEGATIVE 09/03/2020 1246   LEUKOCYTESUR NEGATIVE 09/03/2020 1246   LEUKOCYTESUR Negative 10/15/2013 2200   Sepsis Labs: @LABRCNTIP (procalcitonin:4,lacticidven:4) ) Recent Results (from the past 240 hour(s))  SARS CORONAVIRUS 2 (TAT 6-24 HRS) Nasopharyngeal     Status: None   Collection Time: 09/03/20 12:46 PM   Specimen: Nasopharyngeal  Result Value Ref Range Status   SARS Coronavirus 2 NEGATIVE NEGATIVE Final    Comment: (NOTE) SARS-CoV-2 target nucleic acids are NOT DETECTED.  The SARS-CoV-2 RNA is generally detectable in upper and lower respiratory specimens during the acute phase  of infection. Negative results do not preclude SARS-CoV-2 infection, do not rule out co-infections with other pathogens, and should not be used as the sole basis for treatment or other patient management decisions. Negative results must be combined with clinical observations, patient history, and epidemiological information. The expected result is Negative.  Fact Sheet for Patients: SugarRoll.be  Fact Sheet for Healthcare Providers: https://www.woods-mathews.com/  This test is not yet approved or cleared by the [place] FDA and  has been authorized for detection and/or diagnosis of SARS-CoV-2 by FDA under an Emergency Use Authorization (EUA). This EUA will remain  in effect (meaning this test can be used) for the duration of the COVID-19 declaration under Se ction 564(b)(1) of the Act, 21 U.S.C. section 360bbb-3(b)(1), unless the authorization is terminated or revoked sooner.  Performed at [facility], [place] [address] [place], [place] [address]          Radiology  Studies: CT Angio Chest PE W and/or Wo Contrast  Result Date: 09/03/2020 CLINICAL DATA:  Centralized chest pain radiating to left arm with nausea and vomiting. Recent cardiac stent placement 2 months ago. EXAM: CT ANGIOGRAPHY CHEST CT ABDOMEN AND PELVIS WITH CONTRAST TECHNIQUE: Multidetector CT imaging of the chest was performed using the standard protocol during bolus administration of intravenous contrast. Multiplanar CT image reconstructions and MIPs were obtained to evaluate the vascular anatomy. Multidetector CT imaging of the abdomen and pelvis was performed using the standard protocol during bolus administration of intravenous contrast. CONTRAST:  51mL OMNIPAQUE IOHEXOL 350 MG/ML SOLN COMPARISON:  CT a chest, abdomen, and pelvis dated December 12, 2019. FINDINGS: CTA CHEST FINDINGS Cardiovascular: Satisfactory opacification of the pulmonary arteries to the segmental level. No evidence of pulmonary embolism. Normal heart size. No pericardial effusion. Prior CABG. Coronary, aortic arch, and branch vessel atherosclerotic vascular disease. Mediastinum/Nodes: No enlarged mediastinal, hilar, or axillary lymph nodes. Thyroid gland, trachea, and esophagus demonstrate no significant findings. Lungs/Pleura: Unchanged biapical pleuroparenchymal scarring. No focal consolidation, pleural effusion, or pneumothorax. No suspicious pulmonary nodule. Musculoskeletal: Unchanged mild bilateral gynecomastia. No acute or significant osseous findings. Review of the MIP images confirms the above findings. CT ABDOMEN AND PELVIS FINDINGS Hepatobiliary: No focal liver abnormality is seen. No gallstones or gallbladder wall thickening. Mild intra- and extrahepatic biliary dilatation. Common bile duct measures 10 mm in diameter. Pancreas: Unchanged mild prominence of the main pancreatic duct. No surrounding inflammatory changes. Spleen: Normal in size without focal abnormality. Adrenals/Urinary Tract: The adrenal glands are unremarkable.  Unchanged 11 mm right renal calculus. No hydronephrosis or renal mass. The bladder is unremarkable. Stomach/Bowel: Stomach is within normal limits. Appendix appears normal. No evidence of bowel wall thickening, distention, or inflammatory changes. Vascular/Lymphatic: Aortic atherosclerosis. No enlarged abdominal or pelvic lymph nodes. Reproductive: Prostate is unremarkable. Other: No abdominal wall hernia or abnormality. No abdominopelvic ascites. No pneumoperitoneum. Musculoskeletal: No acute or significant osseous findings. Review of the MIP images confirms the above findings. IMPRESSION: 1. No evidence of pulmonary embolism. No acute intrathoracic process. 2. Mild intra- and extrahepatic biliary dilatation with common bile duct measuring 10 mm in diameter. Correlation with LFTs is recommended. If there is clinical concern for biliary obstruction, consider further evaluation with MRCP or ERCP. 3. Unchanged 11 mm nonobstructive right nephrolithiasis. 4. Aortic Atherosclerosis (ICD10-I70.0). Electronically Signed   By: Titus Dubin M.D.   On: 09/03/2020 12:03   CT ABDOMEN PELVIS W CONTRAST  Result Date: 09/03/2020 CLINICAL DATA:  Centralized chest pain radiating to left arm with nausea and  vomiting. Recent cardiac stent placement 2 months ago. EXAM: CT ANGIOGRAPHY CHEST CT ABDOMEN AND PELVIS WITH CONTRAST TECHNIQUE: Multidetector CT imaging of the chest was performed using the standard protocol during bolus administration of intravenous contrast. Multiplanar CT image reconstructions and MIPs were obtained to evaluate the vascular anatomy. Multidetector CT imaging of the abdomen and pelvis was performed using the standard protocol during bolus administration of intravenous contrast. CONTRAST:  43mL OMNIPAQUE IOHEXOL 350 MG/ML SOLN COMPARISON:  CT a chest, abdomen, and pelvis dated December 12, 2019. FINDINGS: CTA CHEST FINDINGS Cardiovascular: Satisfactory opacification of the pulmonary arteries to the segmental  level. No evidence of pulmonary embolism. Normal heart size. No pericardial effusion. Prior CABG. Coronary, aortic arch, and branch vessel atherosclerotic vascular disease. Mediastinum/Nodes: No enlarged mediastinal, hilar, or axillary lymph nodes. Thyroid gland, trachea, and esophagus demonstrate no significant findings. Lungs/Pleura: Unchanged biapical pleuroparenchymal scarring. No focal consolidation, pleural effusion, or pneumothorax. No suspicious pulmonary nodule. Musculoskeletal: Unchanged mild bilateral gynecomastia. No acute or significant osseous findings. Review of the MIP images confirms the above findings. CT ABDOMEN AND PELVIS FINDINGS Hepatobiliary: No focal liver abnormality is seen. No gallstones or gallbladder wall thickening. Mild intra- and extrahepatic biliary dilatation. Common bile duct measures 10 mm in diameter. Pancreas: Unchanged mild prominence of the main pancreatic duct. No surrounding inflammatory changes. Spleen: Normal in size without focal abnormality. Adrenals/Urinary Tract: The adrenal glands are unremarkable. Unchanged 11 mm right renal calculus. No hydronephrosis or renal mass. The bladder is unremarkable. Stomach/Bowel: Stomach is within normal limits. Appendix appears normal. No evidence of bowel wall thickening, distention, or inflammatory changes. Vascular/Lymphatic: Aortic atherosclerosis. No enlarged abdominal or pelvic lymph nodes. Reproductive: Prostate is unremarkable. Other: No abdominal wall hernia or abnormality. No abdominopelvic ascites. No pneumoperitoneum. Musculoskeletal: No acute or significant osseous findings. Review of the MIP images confirms the above findings. IMPRESSION: 1. No evidence of pulmonary embolism. No acute intrathoracic process. 2. Mild intra- and extrahepatic biliary dilatation with common bile duct measuring 10 mm in diameter. Correlation with LFTs is recommended. If there is clinical concern for biliary obstruction, consider further  evaluation with MRCP or ERCP. 3. Unchanged 11 mm nonobstructive right nephrolithiasis. 4. Aortic Atherosclerosis (ICD10-I70.0). Electronically Signed   By: Titus Dubin M.D.   On: 09/03/2020 12:03   DG Chest Portable 1 View  Result Date: 09/03/2020 CLINICAL DATA:  Shortness of breath, left-sided chest pain EXAM: PORTABLE CHEST 1 VIEW COMPARISON:  08/07/2020 FINDINGS: Cardiac shadow is within normal limits. Postsurgical changes are again seen. Lungs are well aerated bilaterally. No focal infiltrate or effusion is seen. No acute bony abnormality is noted. Postsurgical changes in the cervical spine and proximal left humerus are noted. IMPRESSION: No acute abnormality seen. Electronically Signed   By: Inez Catalina M.D.   On: 09/03/2020 09:56      Scheduled Meds: . atorvastatin  80 mg Oral Daily  . carvedilol  3.125 mg Oral BID WC  . clopidogrel  75 mg Oral q morning  . ezetimibe  10 mg Oral Daily  . gabapentin  100 mg Oral TID  . insulin aspart  0-9 Units Subcutaneous Q4H  . isosorbide mononitrate  30 mg Oral Daily  . multivitamin with minerals  1 tablet Oral Daily  . pantoprazole (PROTONIX) IV  40 mg Intravenous Q24H  . potassium chloride  40 mEq Oral Q4H   Continuous Infusions: . sodium chloride 100 mL/hr (09/03/20 1547)     LOS: 1 day      Debbe Odea, MD  [facility] Pager: [email or web address] 09/04/2020, 11:34 AM

## 2020-09-04 NOTE — Progress Notes (Signed)
Bryan Darby, MD [address]  [place]  [place], [place] [address]  Main: [phone]  Fax: [phone] Pager: [phone]   Subjective: No acute events overnight.  Patient reports lower abdominal pain.  He denies any nausea, vomiting, fever, chills.  Patient is also seen by Dr. Saunders Revel, cardiologist   Objective: Vital signs in last 24 hours: Vitals:   09/04/20 0404 09/04/20 0740 09/04/20 0900 09/04/20 1243  BP: 124/78 125/73  121/78  Pulse: 84 78  83  Resp: 16 18  18   Temp: 98 F (36.7 C) 98.2 F (36.8 C)  97.9 F (36.6 C)  TempSrc: Oral Oral  Oral  SpO2: 96% 97%  96%  Weight:   79.5 kg   Height:       Weight change:   Intake/Output Summary (Last 24 hours) at 09/04/2020 1355 Last data filed at 09/04/2020 1143 Gross per 24 hour  Intake 701.64 ml  Output 700 ml  Net 1.64 ml     Exam: Heart:: Regular rate and rhythm or S1S2 present Lungs: normal and clear to auscultation Abdomen: soft, suprapubic tenderness, normal bowel sounds   Lab Results: CBC Latest Ref Rng & Units 09/04/2020 09/03/2020 08/08/2020  WBC 4.0 - 10.5 K/uL 9.7 9.3 12.1(H)  Hemoglobin 13.0 - 17.0 g/dL 16.4 19.1(H) 16.0  Hematocrit 39.0 - 52.0 % 46.7 54.2(H) 46.2  Platelets 150 - 400 K/uL 204 224 185   CMP Latest Ref Rng & Units 09/04/2020 09/03/2020 09/03/2020  Glucose 70 - 99 mg/dL 83 - 159(H)  BUN 8 - 23 mg/dL 22 - 19  Creatinine 0.61 - 1.24 mg/dL 1.19 - 1.27(H)  Sodium 135 - 145 mmol/L 134(L) - 129(L)  Potassium 3.5 - 5.1 mmol/L 3.2(L) - 3.6  Chloride 98 - 111 mmol/L 102 - 95(L)  CO2 22 - 32 mmol/L 19(L) - 20(L)  Calcium 8.9 - 10.3 mg/dL 8.9 - 9.9  Total Protein 6.5 - 8.1 g/dL 6.5 - 8.5(H)  Total Bilirubin 0.3 - 1.2 mg/dL 3.2(H) 2.6(H) 3.0(H)  Alkaline Phos 38 - 126 U/L 64 - 86  AST 15 - 41 U/L 18 - 25  ALT 0 - 44 U/L 19 - 25    Micro Results: Recent Results (from the past 240 hour(s))  SARS CORONAVIRUS 2 (TAT 6-24 HRS) Nasopharyngeal     Status: None   Collection Time:  09/03/20 12:46 PM   Specimen: Nasopharyngeal  Result Value Ref Range Status   SARS Coronavirus 2 NEGATIVE NEGATIVE Final    Comment: (NOTE) SARS-CoV-2 target nucleic acids are NOT DETECTED.  The SARS-CoV-2 RNA is generally detectable in upper and lower respiratory specimens during the acute phase of infection. Negative results do not preclude SARS-CoV-2 infection, do not rule out co-infections with other pathogens, and should not be used as the sole basis for treatment or other patient management decisions. Negative results must be combined with clinical observations, patient history, and epidemiological information. The expected result is Negative.  Fact Sheet for Patients: SugarRoll.be  Fact Sheet for Healthcare Providers: https://www.woods-mathews.com/  This test is not yet approved or cleared by the [place] FDA and  has been authorized for detection and/or diagnosis of SARS-CoV-2 by FDA under an Emergency Use Authorization (EUA). This EUA will remain  in effect (meaning this test can be used) for the duration of the COVID-19 declaration under Se ction 564(b)(1) of the Act, 21 U.S.C. section 360bbb-3(b)(1), unless the authorization is terminated or revoked sooner.  Performed at [facility], [address], [place], [place] [address]    Studies/Results: CT Angio Chest PE W and/or Wo Contrast  Result Date: 09/03/2020 CLINICAL DATA:  Centralized chest pain radiating to left arm with nausea and vomiting. Recent cardiac stent placement 2 months ago. EXAM: CT ANGIOGRAPHY CHEST CT ABDOMEN AND PELVIS WITH CONTRAST TECHNIQUE: Multidetector CT imaging of the chest was performed using the standard protocol during bolus administration of intravenous contrast. Multiplanar CT image reconstructions and MIPs were obtained to evaluate the vascular anatomy. Multidetector CT imaging of the abdomen and pelvis was performed using the standard  protocol during bolus administration of intravenous contrast. CONTRAST:  16mL OMNIPAQUE IOHEXOL 350 MG/ML SOLN COMPARISON:  CT a chest, abdomen, and pelvis dated December 12, 2019. FINDINGS: CTA CHEST FINDINGS Cardiovascular: Satisfactory opacification of the pulmonary arteries to the segmental level. No evidence of pulmonary embolism. Normal heart size. No pericardial effusion. Prior CABG. Coronary, aortic arch, and branch vessel atherosclerotic vascular disease. Mediastinum/Nodes: No enlarged mediastinal, hilar, or axillary lymph nodes. Thyroid gland, trachea, and esophagus demonstrate no significant findings. Lungs/Pleura: Unchanged biapical pleuroparenchymal scarring. No focal consolidation, pleural effusion, or pneumothorax. No suspicious pulmonary nodule. Musculoskeletal: Unchanged mild bilateral gynecomastia. No acute or significant osseous findings. Review of the MIP images confirms the above findings. CT ABDOMEN AND PELVIS FINDINGS Hepatobiliary: No focal liver abnormality is seen. No gallstones or gallbladder wall thickening. Mild intra- and extrahepatic biliary dilatation. Common bile duct measures 10 mm in diameter. Pancreas: Unchanged mild prominence of the main pancreatic duct. No surrounding inflammatory changes. Spleen: Normal in size without focal abnormality. Adrenals/Urinary Tract: The adrenal glands are unremarkable. Unchanged 11 mm right renal calculus. No hydronephrosis or renal mass. The bladder is unremarkable. Stomach/Bowel: Stomach is within normal limits. Appendix appears normal. No evidence of bowel wall thickening, distention, or inflammatory changes. Vascular/Lymphatic: Aortic atherosclerosis. No enlarged abdominal or pelvic lymph nodes. Reproductive: Prostate is unremarkable. Other: No abdominal wall hernia or abnormality. No abdominopelvic ascites. No pneumoperitoneum. Musculoskeletal: No acute or significant osseous findings. Review of the MIP images confirms the above findings.  IMPRESSION: 1. No evidence of pulmonary embolism. No acute intrathoracic process. 2. Mild intra- and extrahepatic biliary dilatation with common bile duct measuring 10 mm in diameter. Correlation with LFTs is recommended. If there is clinical concern for biliary obstruction, consider further evaluation with MRCP or ERCP. 3. Unchanged 11 mm nonobstructive right nephrolithiasis. 4. Aortic Atherosclerosis (ICD10-I70.0). Electronically Signed   By: Titus Dubin M.D.   On: 09/03/2020 12:03   CT ABDOMEN PELVIS W CONTRAST  Result Date: 09/03/2020 CLINICAL DATA:  Centralized chest pain radiating to left arm with nausea and vomiting. Recent cardiac stent placement 2 months ago. EXAM: CT ANGIOGRAPHY CHEST CT ABDOMEN AND PELVIS WITH CONTRAST TECHNIQUE: Multidetector CT imaging of the chest was performed using the standard protocol during bolus administration of intravenous contrast. Multiplanar CT image reconstructions and MIPs were obtained to evaluate the vascular anatomy. Multidetector CT imaging of the abdomen and pelvis was performed using the standard protocol during bolus administration of intravenous contrast. CONTRAST:  25mL OMNIPAQUE IOHEXOL 350 MG/ML SOLN COMPARISON:  CT a chest, abdomen, and pelvis dated December 12, 2019. FINDINGS: CTA CHEST FINDINGS Cardiovascular: Satisfactory opacification of the pulmonary arteries to the segmental level. No evidence of pulmonary embolism. Normal heart size. No pericardial effusion. Prior CABG. Coronary, aortic arch, and branch vessel atherosclerotic vascular disease. Mediastinum/Nodes: No enlarged mediastinal, hilar, or axillary lymph nodes. Thyroid gland, trachea, and esophagus demonstrate no significant findings. Lungs/Pleura: Unchanged biapical pleuroparenchymal scarring. No  focal consolidation, pleural effusion, or pneumothorax. No suspicious pulmonary nodule. Musculoskeletal: Unchanged mild bilateral gynecomastia. No acute or significant osseous findings. Review of the  MIP images confirms the above findings. CT ABDOMEN AND PELVIS FINDINGS Hepatobiliary: No focal liver abnormality is seen. No gallstones or gallbladder wall thickening. Mild intra- and extrahepatic biliary dilatation. Common bile duct measures 10 mm in diameter. Pancreas: Unchanged mild prominence of the main pancreatic duct. No surrounding inflammatory changes. Spleen: Normal in size without focal abnormality. Adrenals/Urinary Tract: The adrenal glands are unremarkable. Unchanged 11 mm right renal calculus. No hydronephrosis or renal mass. The bladder is unremarkable. Stomach/Bowel: Stomach is within normal limits. Appendix appears normal. No evidence of bowel wall thickening, distention, or inflammatory changes. Vascular/Lymphatic: Aortic atherosclerosis. No enlarged abdominal or pelvic lymph nodes. Reproductive: Prostate is unremarkable. Other: No abdominal wall hernia or abnormality. No abdominopelvic ascites. No pneumoperitoneum. Musculoskeletal: No acute or significant osseous findings. Review of the MIP images confirms the above findings. IMPRESSION: 1. No evidence of pulmonary embolism. No acute intrathoracic process. 2. Mild intra- and extrahepatic biliary dilatation with common bile duct measuring 10 mm in diameter. Correlation with LFTs is recommended. If there is clinical concern for biliary obstruction, consider further evaluation with MRCP or ERCP. 3. Unchanged 11 mm nonobstructive right nephrolithiasis. 4. Aortic Atherosclerosis (ICD10-I70.0). Electronically Signed   By: Titus Dubin M.D.   On: 09/03/2020 12:03   DG Chest Portable 1 View  Result Date: 09/03/2020 CLINICAL DATA:  Shortness of breath, left-sided chest pain EXAM: PORTABLE CHEST 1 VIEW COMPARISON:  08/07/2020 FINDINGS: Cardiac shadow is within normal limits. Postsurgical changes are again seen. Lungs are well aerated bilaterally. No focal infiltrate or effusion is seen. No acute bony abnormality is noted. Postsurgical changes in the  cervical spine and proximal left humerus are noted. IMPRESSION: No acute abnormality seen. Electronically Signed   By: Inez Catalina M.D.   On: 09/03/2020 09:56   Medications:  I have reviewed the patient's current medications. Prior to Admission:  Medications Prior to Admission  Medication Sig Dispense Refill Last Dose  . atorvastatin (LIPITOR) 80 MG tablet Take 80 mg by mouth daily.   09/02/2020 at Unknown time  . carvedilol (COREG) 3.125 MG tablet Take 1 tablet (3.125 mg total) by mouth 2 (two) times daily with a meal. 60 tablet 1 09/02/2020 at Unknown time  . clopidogrel (PLAVIX) 75 MG tablet Take 1 tablet (75 mg total) by mouth daily with breakfast. 60 tablet 1 09/02/2020 at Unknown time  . empagliflozin (JARDIANCE) 25 MG TABS tablet Take 25 mg by mouth daily.   09/02/2020 at Unknown time  . ezetimibe (ZETIA) 10 MG tablet Take 1 tablet (10 mg total) by mouth daily. 30 tablet 0 09/03/2020 at Unknown time  . gabapentin (NEURONTIN) 100 MG capsule Take 100 mg by mouth 3 (three) times daily.   09/02/2020 at Unknown time  . glipiZIDE (GLUCOTROL) 10 MG tablet Take 10 mg by mouth 2 (two) times daily before a meal.   09/02/2020 at Unknown time  . guaiFENesin-dextromethorphan (ROBITUSSIN DM) 100-10 MG/5ML syrup Take 5 mLs by mouth every 4 (four) hours as needed for cough. 118 mL 0 Past Week at Unknown time  . isosorbide mononitrate (IMDUR) 30 MG 24 hr tablet Take 1 tablet (30 mg total) by mouth daily. 30 tablet 0 09/02/2020 at Unknown time  . metFORMIN (GLUMETZA) 500 MG (MOD) 24 hr tablet Take 500 mg by mouth 2 (two) times daily with a meal.   09/02/2020 at Unknown time  .  Multiple Vitamin (MULTIVITAMIN WITH MINERALS) TABS tablet Take 1 tablet by mouth daily. 30 tablet 0 09/02/2020 at Unknown time  . nitroGLYCERIN (NITROSTAT) 0.4 MG SL tablet Place 1 tablet (0.4 mg total) under the tongue every 5 (five) minutes as needed for chest pain. 30 tablet 1   . spironolactone (ALDACTONE) 25 MG tablet Take 1 tablet (25 mg  total) by mouth daily. 30 tablet 0 09/02/2020 at Unknown time  . morphine (MS CONTIN) 15 MG 12 hr tablet Take 15 mg by mouth every 12 (twelve) hours as needed for pain. (Patient not taking: Reported on 09/03/2020)   Not Taking at Unknown time  . Nutritional Supplements (,FEEDING SUPPLEMENT, PROSOURCE PLUS) liquid Take 30 mLs by mouth 2 (two) times daily between meals. 887 mL 0    Scheduled: . atorvastatin  80 mg Oral Daily  . carvedilol  3.125 mg Oral BID WC  . clopidogrel  75 mg Oral q morning  . ezetimibe  10 mg Oral Daily  . gabapentin  100 mg Oral TID  . insulin aspart  0-9 Units Subcutaneous Q4H  . isosorbide mononitrate  30 mg Oral Daily  . multivitamin with minerals  1 tablet Oral Daily  . pantoprazole (PROTONIX) IV  40 mg Intravenous Q24H  . potassium chloride  40 mEq Oral Q4H   Continuous: . sodium chloride 100 mL/hr (09/03/20 1547)   OQH:UTMLYYTK injection, nitroGLYCERIN, ondansetron **OR** ondansetron (ZOFRAN) IV, traZODone Anti-infectives (From admission, onward)   None     Scheduled Meds: . atorvastatin  80 mg Oral Daily  . carvedilol  3.125 mg Oral BID WC  . clopidogrel  75 mg Oral q morning  . ezetimibe  10 mg Oral Daily  . gabapentin  100 mg Oral TID  . insulin aspart  0-9 Units Subcutaneous Q4H  . isosorbide mononitrate  30 mg Oral Daily  . multivitamin with minerals  1 tablet Oral Daily  . pantoprazole (PROTONIX) IV  40 mg Intravenous Q24H  . potassium chloride  40 mEq Oral Q4H   Continuous Infusions: . sodium chloride 100 mL/hr (09/03/20 1547)   PRN Meds:.morphine injection, nitroGLYCERIN, ondansetron **OR** ondansetron (ZOFRAN) IV, traZODone   Assessment: Principal Problem:   Chest pain Active Problems:   Type 2 diabetes mellitus (HCC)   Essential hypertension   PAF (paroxysmal atrial fibrillation) (HCC)   Hyponatremia   History of pancreatitis   Plan: 75 year old male with history of coronary disease s/p CABG and PCI with DES in 1/22, on Plavix  is admitted with chest pain, shortness of breath.  Patient was found to have isolated indirect hyperbilirubinemia and incidentally noted to have dilated CBD.  There is no evidence of acute pancreatitis, cholangitis.  LFTs have been normal today except for elevated bilirubin.  Even if there is choledocholithiasis, his clinical presentation represents nonobstructive pattern.  Recommend MRCP in order to further characterize intra and extrahepatic bile ducts.  Appreciate cardiology recommendations and please continue Plavix at this time.  Even if patient needs ERCP, we can place a temporary CBD stent which does not need interruption of Plavix.  Given history of paroxysmal A. fib, Xarelto has been recommended to be restarted by cardiology.   LOS: 1 day   Rohini Vanga 09/04/2020, 1:55 PM

## 2020-09-04 NOTE — Consult Note (Signed)
Cardiology Consultation:   Patient ID: Bryan Scott MRN: [id number]; DOB: 05-08-1946  Admit date: 09/03/2020 Date of Consult: 09/04/2020  PCP:  [name], [place]  Cardiologist:  [facility]  Patient Profile:   Bryan Scott is a 75 y.o. male with a hx of CAD s/p CABG and PCI to SVG-RCA (07/05/20), atrial fibrillation not currently on anticoagulation, hypertension, hyperlipidemia, and pancreatitis with recurrent abdominal pain who is being seen today for the evaluation of chest pain at the request of Dr. Francine Graven.  History of Present Illness:   Bryan Scott was hospitalized in January with chest pain, abdominal pain, and elevated troponin in 06/2020.  Catheterization at that time showed a high-grade stenosis involving SVG-RCA, which was treated with DES x 1. He was hospitalized again a month ago with syncope, which he attributes to dehydration in the setting of abdominal pain and poor PO intake.  He was found to have evidence of acute pancreatitis and was treated conservatively.  He has continued to have intermittent bouts of abdominal pain every few days, which has led to poor hydration.  He describes the pain as sharp, worsened by eating and occasionally radiating from the periumbilical region to his chest.  He had an episode of nausea and vomiting yesterday followed by syncope, which led to his ED visit yesterday.  He was noted to have elevated bilirubin and dilated common bile duct on CT.  He has not had any chest pain reminiscent of what he experienced at the time of his NSTEMI in January.  He also denies shortness of breath and palpitations, though he is frequently lightheaded when he stands up.  He has been compliant with his medications, including clopidogrel.  He is not on aspirin given history of allergy as well as previous anticoagulation for his PAF (he is not on NOAC at this time).   Past Medical History:  Diagnosis Date  . 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 Cancer ([place])   . Carotid artery disease (HCC)    > 75% bilateral ICA stenoses on 01/2013 CT  . Chronic pain syndrome   . Coronary artery disease    s/p CABG ~ 2007  . GERD (gastroesophageal reflux disease)   . Heparin allergy    Bleeding  . History of tobacco use   . Hyperlipidemia   . Hypertension   . Ischemic cardiomyopathy   . Paroxysmal atrial fibrillation (HCC)    On Xarelto anticoagulation  . Type 2 diabetes mellitus (HCC)    A1C 6.8% in 01/2013    Past Surgical History:  Procedure Laterality Date  . CARDIAC CATHETERIZATION    . CORONARY ARTERY BYPASS GRAFT  2007  . CORONARY STENT INTERVENTION N/A 07/13/2020   Procedure: CORONARY STENT INTERVENTION;  Surgeon: Nelva Bush, MD;  Location: [facility];  Service: Cardiovascular;  Laterality: N/A;  . LEFT HEART CATH AND CORS/GRAFTS ANGIOGRAPHY N/A 07/13/2020   Procedure: LEFT HEART CATH AND CORS/GRAFTS ANGIOGRAPHY;  Surgeon: Minna Merritts, MD;  Location: [facility];  Service: Cardiovascular;  Laterality: N/A;     Home Medications:  Prior to Admission medications   Medication Sig Start Date Kella Splinter Date Taking? Authorizing Provider  atorvastatin (LIPITOR) 80 MG tablet Take 80 mg by mouth daily.   Yes [provider]  carvedilol (COREG) 3.125 MG tablet Take 1 tablet (3.125 mg total) by mouth 2 (two) times daily with a meal. 07/19/20  Yes Dwyane Dee,  Pardeep, MD  clopidogrel (PLAVIX) 75 MG tablet Take 1 tablet (75 mg total) by mouth daily with breakfast. 07/20/20  Yes Shawna Clamp, MD  empagliflozin (JARDIANCE) 25 MG TABS tablet Take 25 mg by mouth daily.   Yes [provider]  ezetimibe (ZETIA) 10 MG tablet Take 1 tablet (10 mg total) by mouth daily. 09/11/19  Yes Fritzi Mandes, MD  gabapentin (NEURONTIN) 100 MG capsule Take 100 mg by mouth 3 (three) times daily.   Yes [provider]  glipiZIDE (GLUCOTROL) 10 MG tablet Take 10 mg by mouth 2 (two) times daily before a meal.    Yes [provider]  guaiFENesin-dextromethorphan (ROBITUSSIN DM) 100-10 MG/5ML syrup Take 5 mLs by mouth every 4 (four) hours as needed for cough. 07/16/19  Yes Thornell Mule, MD  isosorbide mononitrate (IMDUR) 30 MG 24 hr tablet Take 1 tablet (30 mg total) by mouth daily. 12/14/19  Yes Mikhail, Velta Addison, DO  metFORMIN (GLUMETZA) 500 MG (MOD) 24 hr tablet Take 500 mg by mouth 2 (two) times daily with a meal.   Yes [provider]  Multiple Vitamin (MULTIVITAMIN WITH MINERALS) TABS tablet Take 1 tablet by mouth daily. 08/10/20  Yes Fritzi Mandes, MD  nitroGLYCERIN (NITROSTAT) 0.4 MG SL tablet Place 1 tablet (0.4 mg total) under the tongue every 5 (five) minutes as needed for chest pain. 07/19/20  Yes Shawna Clamp, MD  spironolactone (ALDACTONE) 25 MG tablet Take 1 tablet (25 mg total) by mouth daily. 09/11/19  Yes Fritzi Mandes, MD  morphine (MS CONTIN) 15 MG 12 hr tablet Take 15 mg by mouth every 12 (twelve) hours as needed for pain. Patient not taking: Reported on 09/03/2020    [provider]  Nutritional Supplements (,FEEDING SUPPLEMENT, PROSOURCE PLUS) liquid Take 30 mLs by mouth 2 (two) times daily between meals. 08/09/20   Fritzi Mandes, MD    Inpatient Medications: Scheduled Meds: . atorvastatin  80 mg Oral Daily  . carvedilol  3.125 mg Oral BID WC  . clopidogrel  75 mg Oral q morning  . ezetimibe  10 mg Oral Daily  . gabapentin  100 mg Oral TID  . insulin aspart  0-9 Units Subcutaneous Q4H  . isosorbide mononitrate  30 mg Oral Daily  . multivitamin with minerals  1 tablet Oral Daily  . pantoprazole (PROTONIX) IV  40 mg Intravenous Q24H  . potassium chloride  40 mEq Oral Q4H  . spironolactone  25 mg Oral Daily   Continuous Infusions: . sodium chloride 100 mL/hr (09/03/20 1547)   PRN Meds: fentaNYL (SUBLIMAZE) injection, morphine injection, nitroGLYCERIN, ondansetron **OR** ondansetron (ZOFRAN) IV, traZODone  Allergies:    Allergies  Allergen Reactions  .  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 Heparin Other (See Comments)    Reaction:  Bleeding   . Lisinopril Swelling    Social History:   Social History   Tobacco Use  . Smoking status: Former Smoker    Packs/day: 1.00    Years: 30.00    Pack years: 30.00    Types: Cigarettes  . Smokeless tobacco: Never Used  . Tobacco comment: 30 pack-year history. Quit 8-9 years ago.   Vaping Use  . Vaping Use: Never used  Substance Use Topics  . Alcohol use: No  . Drug use: No    Family History:   Family History  Problem Relation Age of Onset  . Heart disease Mother 17       Deceased  . CVA Mother   . Seizures Father 2  Deceased from complications with seizures     ROS:  Please see the history of present illness. All other ROS reviewed and negative.     Physical Exam/Data:   Vitals:   09/03/20 2104 09/04/20 0404 09/04/20 0740 09/04/20 0900  BP: 133/83 124/78 125/73   Pulse: 79 84 78   Resp: 16 16 18    Temp: 97.7 F (36.5 C) 98 F (36.7 C) 98.2 F (36.8 C)   TempSrc: Oral Oral Oral   SpO2: 97% 96% 97%   Weight:    79.5 kg  Height:        Intake/Output Summary (Last 24 hours) at 09/04/2020 1043 Last data filed at 09/04/2020 1001 Gross per 24 hour  Intake 701.64 ml  Output 700 ml  Net 1.64 ml   Last 3 Weights 09/04/2020 09/03/2020 09/03/2020  Weight (lbs) 175 lb 3.2 oz 179 lb 160 lb  Weight (kg) 79.47 kg 81.194 kg 72.576 kg     Body mass index is 23.76 kg/m.  General:  Thin, elderly man lying in bed. HEENT: normal Lymph: no adenopathy Neck: no JVD Endocrine:  No thryomegaly Vascular: No carotid bruits; FA pulses 2+ bilaterally without bruits  Cardiac:  normal S1, S2; RRR; no murmurs, rubs, or gallops. Lungs:  clear to auscultation bilaterally, no wheezing, rhonchi or rales  Abd: Soft with periumbilical tenderness and voluntary guarding.  No RUQ tenderness. Ext: no edema Musculoskeletal:  No deformities, BUE and BLE strength normal and equal Skin: warm and dry   Neuro:  CNs 2-12 intact, no focal abnormalities noted Psych:  Normal affect   EKG:  The EKG was personally reviewed and demonstrates:  NSR with inferior MI and nonspecific ST changes. Telemetry:  Telemetry was personally reviewed and demonstrates:  Normal sinus rhythm.  Relevant CV Studies: LHC/PCI (07/13/2020): Moderate ostial left main disease Severe disease of the native left circumflex and RCA, and diagonal branch Vein graft to the PDA with severe focal disease Vein graft to diagonal is occluded ostially Vein graft to OM is patent Successful PCI to SVG to PDA using Resolute Onyx 3.5 x 18 mm DES  TTE (09/09/2019): 1. Left ventricular ejection fraction, by estimation, is 40 to 45%. The  left ventricle has mildly decreased function. The left ventricle has no  regional wall motion abnormalities. Left ventricular diastolic parameters  are consistent with Grade I  diastolic dysfunction (impaired relaxation).  2. Right ventricular systolic function is normal. The right ventricular  size is normal.  3. The mitral valve is normal in structure. Trivial mitral valve  regurgitation. No evidence of mitral stenosis.  4. The aortic valve is normal in structure. Aortic valve regurgitation is  not visualized. No aortic stenosis is present.  5. The inferior vena cava is normal in size with greater than 50%  respiratory variability, suggesting right atrial pressure of 3 mmHg.   Laboratory Data:  High Sensitivity Troponin:   Recent Labs  Lab 08/07/20 1538 08/07/20 1725 09/03/20 0921 09/03/20 1019  TROPONINIHS 9 9 5 7      Chemistry Recent Labs  Lab 09/03/20 0921 09/04/20 0431  NA 129* 134*  K 3.6 3.2*  CL 95* 102  CO2 20* 19*  GLUCOSE 159* 83  BUN 19 22  CREATININE 1.27* 1.19  CALCIUM 9.9 8.9  GFRNONAA 59* >60  ANIONGAP 14 13    Recent Labs  Lab 09/03/20 0921 09/04/20 0431  PROT 8.5* 6.5  ALBUMIN 4.5 3.6  AST 25 18  ALT 25 19  ALKPHOS 86  64  BILITOT 2.6*  3.0*  3.2*   Hematology Recent Labs  Lab 09/03/20 0921 09/04/20 0431  WBC 9.3 9.7  RBC 5.77 4.98  HGB 19.1* 16.4  HCT 54.2* 46.7  MCV 93.9 93.8  MCH 33.1 32.9  MCHC 35.2 35.1  RDW 12.6 12.7  PLT 224 204   BNPNo results for input(s): BNP, PROBNP in the last 168 hours.  DDimer No results for input(s): DDIMER in the last 168 hours.   Radiology/Studies:  CT Angio Chest PE W and/or Wo Contrast  Result Date: 09/03/2020 CLINICAL DATA:  Centralized chest pain radiating to left arm with nausea and vomiting. Recent cardiac stent placement 2 months ago. EXAM: CT ANGIOGRAPHY CHEST CT ABDOMEN AND PELVIS WITH CONTRAST TECHNIQUE: Multidetector CT imaging of the chest was performed using the standard protocol during bolus administration of intravenous contrast. Multiplanar CT image reconstructions and MIPs were obtained to evaluate the vascular anatomy. Multidetector CT imaging of the abdomen and pelvis was performed using the standard protocol during bolus administration of intravenous contrast. CONTRAST:  81mL OMNIPAQUE IOHEXOL 350 MG/ML SOLN COMPARISON:  CT a chest, abdomen, and pelvis dated December 12, 2019. FINDINGS: CTA CHEST FINDINGS Cardiovascular: Satisfactory opacification of the pulmonary arteries to the segmental level. No evidence of pulmonary embolism. Normal heart size. No pericardial effusion. Prior CABG. Coronary, aortic arch, and branch vessel atherosclerotic vascular disease. Mediastinum/Nodes: No enlarged mediastinal, hilar, or axillary lymph nodes. Thyroid gland, trachea, and esophagus demonstrate no significant findings. Lungs/Pleura: Unchanged biapical pleuroparenchymal scarring. No focal consolidation, pleural effusion, or pneumothorax. No suspicious pulmonary nodule. Musculoskeletal: Unchanged mild bilateral gynecomastia. No acute or significant osseous findings. Review of the MIP images confirms the above findings. CT ABDOMEN AND PELVIS FINDINGS Hepatobiliary: No focal liver abnormality is  seen. No gallstones or gallbladder wall thickening. Mild intra- and extrahepatic biliary dilatation. Common bile duct measures 10 mm in diameter. Pancreas: Unchanged mild prominence of the main pancreatic duct. No surrounding inflammatory changes. Spleen: Normal in size without focal abnormality. Adrenals/Urinary Tract: The adrenal glands are unremarkable. Unchanged 11 mm right renal calculus. No hydronephrosis or renal mass. The bladder is unremarkable. Stomach/Bowel: Stomach is within normal limits. Appendix appears normal. No evidence of bowel wall thickening, distention, or inflammatory changes. Vascular/Lymphatic: Aortic atherosclerosis. No enlarged abdominal or pelvic lymph nodes. Reproductive: Prostate is unremarkable. Other: No abdominal wall hernia or abnormality. No abdominopelvic ascites. No pneumoperitoneum. Musculoskeletal: No acute or significant osseous findings. Review of the MIP images confirms the above findings. IMPRESSION: 1. No evidence of pulmonary embolism. No acute intrathoracic process. 2. Mild intra- and extrahepatic biliary dilatation with common bile duct measuring 10 mm in diameter. Correlation with LFTs is recommended. If there is clinical concern for biliary obstruction, consider further evaluation with MRCP or ERCP. 3. Unchanged 11 mm nonobstructive right nephrolithiasis. 4. Aortic Atherosclerosis (ICD10-I70.0). Electronically Signed   By: Titus Dubin M.D.   On: 09/03/2020 12:03   CT ABDOMEN PELVIS W CONTRAST  Result Date: 09/03/2020 CLINICAL DATA:  Centralized chest pain radiating to left arm with nausea and vomiting. Recent cardiac stent placement 2 months ago. EXAM: CT ANGIOGRAPHY CHEST CT ABDOMEN AND PELVIS WITH CONTRAST TECHNIQUE: Multidetector CT imaging of the chest was performed using the standard protocol during bolus administration of intravenous contrast. Multiplanar CT image reconstructions and MIPs were obtained to evaluate the vascular anatomy. Multidetector CT  imaging of the abdomen and pelvis was performed using the standard protocol during bolus administration of intravenous contrast. CONTRAST:  102mL OMNIPAQUE IOHEXOL 350 MG/ML  SOLN COMPARISON:  CT a chest, abdomen, and pelvis dated December 12, 2019. FINDINGS: CTA CHEST FINDINGS Cardiovascular: Satisfactory opacification of the pulmonary arteries to the segmental level. No evidence of pulmonary embolism. Normal heart size. No pericardial effusion. Prior CABG. Coronary, aortic arch, and branch vessel atherosclerotic vascular disease. Mediastinum/Nodes: No enlarged mediastinal, hilar, or axillary lymph nodes. Thyroid gland, trachea, and esophagus demonstrate no significant findings. Lungs/Pleura: Unchanged biapical pleuroparenchymal scarring. No focal consolidation, pleural effusion, or pneumothorax. No suspicious pulmonary nodule. Musculoskeletal: Unchanged mild bilateral gynecomastia. No acute or significant osseous findings. Review of the MIP images confirms the above findings. CT ABDOMEN AND PELVIS FINDINGS Hepatobiliary: No focal liver abnormality is seen. No gallstones or gallbladder wall thickening. Mild intra- and extrahepatic biliary dilatation. Common bile duct measures 10 mm in diameter. Pancreas: Unchanged mild prominence of the main pancreatic duct. No surrounding inflammatory changes. Spleen: Normal in size without focal abnormality. Adrenals/Urinary Tract: The adrenal glands are unremarkable. Unchanged 11 mm right renal calculus. No hydronephrosis or renal mass. The bladder is unremarkable. Stomach/Bowel: Stomach is within normal limits. Appendix appears normal. No evidence of bowel wall thickening, distention, or inflammatory changes. Vascular/Lymphatic: Aortic atherosclerosis. No enlarged abdominal or pelvic lymph nodes. Reproductive: Prostate is unremarkable. Other: No abdominal wall hernia or abnormality. No abdominopelvic ascites. No pneumoperitoneum. Musculoskeletal: No acute or significant osseous  findings. Review of the MIP images confirms the above findings. IMPRESSION: 1. No evidence of pulmonary embolism. No acute intrathoracic process. 2. Mild intra- and extrahepatic biliary dilatation with common bile duct measuring 10 mm in diameter. Correlation with LFTs is recommended. If there is clinical concern for biliary obstruction, consider further evaluation with MRCP or ERCP. 3. Unchanged 11 mm nonobstructive right nephrolithiasis. 4. Aortic Atherosclerosis (ICD10-I70.0). Electronically Signed   By: Titus Dubin M.D.   On: 09/03/2020 12:03   DG Chest Portable 1 View  Result Date: 09/03/2020 CLINICAL DATA:  Shortness of breath, left-sided chest pain EXAM: PORTABLE CHEST 1 VIEW COMPARISON:  08/07/2020 FINDINGS: Cardiac shadow is within normal limits. Postsurgical changes are again seen. Lungs are well aerated bilaterally. No focal infiltrate or effusion is seen. No acute bony abnormality is noted. Postsurgical changes in the cervical spine and proximal left humerus are noted. IMPRESSION: No acute abnormality seen. Electronically Signed   By: Inez Catalina M.D.   On: 09/03/2020 09:56     Assessment and Plan:   Syncope: Most likely due to dehydration in the setting of poor oral intake due to abdominal pain.  No arrhythmia noted on telemetry.  Continue gentle IV hydration.  Check orthostatic VS.  Obtain echocardiogram.  Continue low-dose carvedilol for now.  Consider outpatient cardiac monitor after discharge.  CAD s/p recent NSTEMI: Episodic chest pain is not consistent with prior angina and is most likely GI in nature.  EKG is stable from prior admission and HS-TnI is negative x 3.  Low suspicion for ACS at this time.  Patient is just under 2 months out from PCI to [facility] in the setting of NSTEMI.  Recommend continuation of clopidogrel 75 mg daily.  If invasive procedures are necessary, would favor continuation of clopidogrel during the perioperative period.  Continue carvedilol  and atorvastatin.  Abdominal pain: Given elevated bilirubin and dilated CBD, there is concern for biliary obstruction.  My exam is not consistent with this however.  GI has recommended ERCP for further evaluation.  I think Bryan Scott is stable from a cardiac standpoint to undergo endoscopic procedures.  I would favor continuation of clopidogrel  during the periprocedural period, if possible.  If clopidogrel must be stopped due to unacceptably high risk of major bleeding, I would favor delaying interruption of DAPT as long as possible (ideally 12 months from NSTEMI in 06/2020 but at least 3 months could be considered).  Given that aspirin cannot be used as single antiplatelet therapy due to allergy, it may be reasonable to bridge Bryan Scott with cangrelor while clopidogrel is washing out.  Hyperlipidemia:  Continue atorvastatin.  Cardiomyopathy: Bryan Scott does not appear volume overloaded.  LVEF moderately reduced by last echo a year ago.  Gentle hydration.  Continue low-dose carvedilol.  Defer ACEI/ARB given history of angioedema.  Repeat echo.  Paroxysmal atrial fibrillation: Patient was previously on rivaroxaban following PCI in 06/2020.  Discharge summary from last month's admission mentions that rivaroxaban should be continued though it was stopped on the discharge medication reconciliation (no explanation given).  Given a CHADSVASC score of at least 6, I would favor restarting when possible.  Continue low-dose carvedilol.  Consider restarting rivaroxaban 20 mg daily if no plans for invasive procedures during this hospitalization.  For questions or updates, please contact [place] Please consult [email or web address] for contact info under [facility] Cardiology.  Signed, Nelva Bush, MD  09/04/2020 10:43 AM

## 2020-09-04 NOTE — Progress Notes (Signed)
Mobility Specialist - Progress Note   09/04/20 1200  Mobility  Range of Motion/Exercises Right leg;Left leg (HR, SLR, ABD, SM)  Level of Assistance Standby assist, set-up cues, supervision of patient - no hands on  Assistive Device None  Distance Ambulated (ft) 0 ft  Mobility Response Tolerated well  Mobility performed by Mobility specialist  $Mobility charge 1 Mobility    Pre-mobility: 88 HR, 98% SpO2 During mobility: 90 HR, 97% SpO2 Post-mobility: 87 HR, 95% SpO2   Pt EOB upon arrival, voiced pain in "belly/pancreas/gallbladder" area. Declined OOB activity this date. Participated in seated exercises: heel raises 2x10, straight leg raises 2x10, abduction 1x5, and seated march 2x10. Pt expressed pain in groin area while performing abduction. While performing, pt said "whew, you're hitting me with a workout!" but rated his RPE afterwards as "it wasn't that bad." RA.    Kathee Delton Mobility Specialist 09/04/20, 12:10 PM

## 2020-09-04 NOTE — Progress Notes (Signed)
*  PRELIMINARY RESULTS* Echocardiogram 2D Echocardiogram has been performed.  Wallie Char Heiss 09/04/2020, 2:31 PM

## 2020-09-05 LAB — COMPREHENSIVE METABOLIC PANEL
ALT: 18 U/L (ref 0–44)
AST: 18 U/L (ref 15–41)
Albumin: 3.6 g/dL (ref 3.5–5.0)
Alkaline Phosphatase: 68 U/L (ref 38–126)
Anion gap: 11 (ref 5–15)
BUN: 19 mg/dL (ref 8–23)
CO2: 20 mmol/L — ABNORMAL LOW (ref 22–32)
Calcium: 8.8 mg/dL — ABNORMAL LOW (ref 8.9–10.3)
Chloride: 103 mmol/L (ref 98–111)
Creatinine, Ser: 1.05 mg/dL (ref 0.61–1.24)
GFR, Estimated: >60 mL/min (ref >60)
Glucose, Bld: 100 mg/dL — ABNORMAL HIGH (ref 70–99)
Potassium: 3.8 mmol/L (ref 3.5–5.1)
Sodium: 134 mmol/L — ABNORMAL LOW (ref 135–145)
Total Bilirubin: 2.9 mg/dL — ABNORMAL HIGH (ref 0.3–1.2)
Total Protein: 6.6 g/dL (ref 6.5–8.1)

## 2020-09-05 LAB — RETIC PANEL
Immature Retic Fract: 14 % (ref 2.3–15.9)
RBC.: 4.73 MIL/uL (ref 4.22–5.81)
Retic Count, Absolute: 105 10*3/uL (ref 19.0–186.0)
Retic Ct Pct: 2.2 % (ref 0.4–3.1)
Reticulocyte Hemoglobin: 37.9 pg (ref >27.9)

## 2020-09-05 LAB — GLUCOSE, CAPILLARY
Glucose-Capillary: 135 mg/dL — ABNORMAL HIGH (ref 70–99)
Glucose-Capillary: 136 mg/dL — ABNORMAL HIGH (ref 70–99)
Glucose-Capillary: 161 mg/dL — ABNORMAL HIGH (ref 70–99)
Glucose-Capillary: 91 mg/dL (ref 70–99)
Glucose-Capillary: 93 mg/dL (ref 70–99)
Glucose-Capillary: 99 mg/dL (ref 70–99)

## 2020-09-05 LAB — MAGNESIUM: Magnesium: 1.7 mg/dL (ref 1.7–2.4)

## 2020-09-05 MED ORDER — SUCRALFATE 1 G PO TABS
1.0000 g | ORAL_TABLET | Freq: Three times a day (TID) | ORAL | Status: DC
  Administered 2020-09-05 – 2020-09-06 (×4): 1 g via ORAL
  Filled 2020-09-05 (×4): qty 1

## 2020-09-05 MED ORDER — SENNOSIDES-DOCUSATE SODIUM 8.6-50 MG PO TABS
2.0000 | ORAL_TABLET | Freq: Two times a day (BID) | ORAL | Status: DC
  Administered 2020-09-05 (×2): 2 via ORAL
  Filled 2020-09-05 (×3): qty 2

## 2020-09-05 MED ORDER — OXYCODONE-ACETAMINOPHEN 5-325 MG PO TABS
1.0000 | ORAL_TABLET | ORAL | Status: DC | PRN
  Administered 2020-09-05 – 2020-09-06 (×4): 1 via ORAL
  Filled 2020-09-05 (×4): qty 1

## 2020-09-05 MED ORDER — PANTOPRAZOLE SODIUM 40 MG PO TBEC
40.0000 mg | DELAYED_RELEASE_TABLET | Freq: Two times a day (BID) | ORAL | Status: DC
  Administered 2020-09-05 – 2020-09-06 (×3): 40 mg via ORAL
  Filled 2020-09-05 (×3): qty 1

## 2020-09-05 MED ORDER — RIVAROXABAN 20 MG PO TABS
20.0000 mg | ORAL_TABLET | Freq: Every day | ORAL | Status: DC
  Administered 2020-09-05: 20 mg via ORAL
  Filled 2020-09-05: qty 1

## 2020-09-05 MED ORDER — LACTULOSE 10 GM/15ML PO SOLN
20.0000 g | Freq: Once | ORAL | Status: AC
  Administered 2020-09-05: 20 g via ORAL
  Filled 2020-09-05: qty 30

## 2020-09-05 NOTE — Progress Notes (Signed)
Mobility Specialist - Progress Note   09/05/20 1200  Mobility  Activity Refused mobility  Mobility performed by Mobility specialist    Pt politely declined mobility this date. Pt c/o nausea, fatigue, and pain at this time "9/10". Will attempt session at another date/time.    Bryan Scott Mobility Specialist 09/05/20, 12:27 PM

## 2020-09-05 NOTE — Plan of Care (Signed)

## 2020-09-05 NOTE — Progress Notes (Signed)
Brief GI note  MRCP is unremarkable.  No evidence of choledocholithiasis.  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

## 2020-09-05 NOTE — Progress Notes (Signed)
PROGRESS NOTE    Bryan Scott  FUX:[id number] DOB: February 20, 1946 DOA: 09/03/2020 PCP: [name], MD   Chief complaint.  Abdominal pain. Brief Narrative:  Bryan Scott is a 75 y.o. male with medical history significant forCAD s/p CABG (recent admission for NSTEMI s/p PCI with DES to [facility] 07/13/2020), PAF on Xarelto, T2DM, HTN, HLD, CAS, cervical spinal stenosis s/p C5-C7 ACDF, chronic pain syndrome, recent hospitalization for acute pancreatitis who presents to the ED via EMS for evaluation of chest pain. Patient also complaining of lower abdominal pain, nausea vomiting, poor appetite.  He also has significant constipation alternating with diarrhea.  Last bowel movement was 4 days ago. He had abnormal liver function, MRCP did not show any common bile duct obstruction, he has been seen by GI. Patient was also seen by cardiology for chest pain, no work-up is needed at this time.   Assessment & Plan:   Principal Problem:   Chest pain Active Problems:   Type 2 diabetes mellitus (HCC)   Essential hypertension   PAF (paroxysmal atrial fibrillation) (HCC)   Hyponatremia   History of pancreatitis  #1.  Abdominal pain or nausea vomiting. Patient had a significant weight loss over the last few months, he has constipation alternating with diarrhea.  He will need EGD and colonoscopy as outpatient. MRCP does not show any common bile duct obstruction. For symptomatic relief, I will place him on PPI twice a day, sucralfate as well as stool softeners.  I will give him a dose of lactulose for constipation. Patient probably can be discharged home tomorrow after symptom is better, he will be followed up with GI for outpatient work-up. 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  2.  Chest pain. .  Noncardiac.  3.  Paroxysmal atrial fibrillation. Continue anticoagulation.  4.  Hyponatremia Appear to be secondary to dehydration. Sodium level still low today,  will continue another day IV fluids, recheck BMP tomorrow.  5.  Type 2 diabetes. Continue SSI.    DVT prophylaxis: Xarelto Code Status: Full Family Communication:  Disposition Plan:  .   Status is: Inpatient  Remains inpatient appropriate because:Inpatient level of care appropriate due to severity of illness   Dispo: The patient is from: Home              Anticipated d/c is to: Home              Patient currently is not medically stable to d/c.   Difficult to place patient No        I/O last 3 completed shifts: In: 1101.6 [I.V.:1101.6] Out: 2050 [Urine:2050] Total I/O In: -  Out: 75 [Urine:650]     Consultants:   GI, cardiology  Procedures: None  Antimicrobials: None  Subjective: Complaining lower abdominal pain, last bowel movement was 4 days ago.  Nausea, vomiting is better. No fever or chills. Denies any short of breath or cough. No additional chest pain. No dysuria or hematuria.  Objective: Vitals:   09/05/20 0823 09/05/20 1100 09/05/20 1131 09/05/20 1225  BP: 122/76  (!) 102/58 136/72  Pulse: 73  73 67  Resp:   17   Temp: 97.6 F (36.4 C)  97.8 F (36.6 C)   TempSrc: Oral  Oral   SpO2: 98%  97%   Weight:  81.9 kg    Height:        Intake/Output Summary (Last 24 hours) at 09/05/2020 1426 Last data filed at 09/05/2020 0800 Gross  per 24 hour  Intake 400 ml  Output 1700 ml  Net -1300 ml   Filed Weights   09/03/20 1806 09/04/20 0900 09/05/20 1100  Weight: 81.2 kg 79.5 kg 81.9 kg    Examination:  General exam: Appears calm and comfortable  Respiratory system: Clear to auscultation. Respiratory effort normal. Cardiovascular system: S1 & S2 heard, RRR. No JVD, murmurs, rubs, gallops or clicks. No pedal edema. Gastrointestinal system: Abdomen is nondistended, soft and LUQ and lower abdominal tender. No organomegaly or masses felt. Normal bowel sounds heard. Central nervous system: Alert and oriented. No focal neurological  deficits. Extremities: Symmetric 5 x 5 power. Skin: No rashes, lesions or ulcers Psychiatry: Judgement and insight appear normal. Mood & affect appropriate.     Data Reviewed: I have personally reviewed following labs and imaging studies  CBC: Recent Labs  Lab 09/03/20 0921 09/04/20 0431  WBC 9.3 9.7  HGB 19.1* 16.4  HCT 54.2* 46.7  MCV 93.9 93.8  PLT 224 497   Basic Metabolic Panel: Recent Labs  Lab 09/03/20 0921 09/04/20 0431 09/05/20 0855  NA 129* 134* 134*  K 3.6 3.2* 3.8  CL 95* 102 103  CO2 20* 19* 20*  GLUCOSE 159* 83 100*  BUN 19 22 19   CREATININE 1.27* 1.19 1.05  CALCIUM 9.9 8.9 8.8*  MG  --   --  1.7   GFR: Estimated Creatinine Clearance: 66.7 mL/min (by C-G formula based on SCr of 1.05 mg/dL). Liver Function Tests: Recent Labs  Lab 09/03/20 0921 09/04/20 0431 09/05/20 0855  AST 25 18 18   ALT 25 19 18   ALKPHOS 86 64 68  BILITOT 2.6*  3.0* 3.2* 2.9*  PROT 8.5* 6.5 6.6  ALBUMIN 4.5 3.6 3.6   Recent Labs  Lab 09/03/20 0921  LIPASE 27   No results for input(s): AMMONIA in the last 168 hours. Coagulation Profile: No results for input(s): INR, PROTIME in the last 168 hours. Cardiac Enzymes: No results for input(s): CKTOTAL, CKMB, CKMBINDEX, TROPONINI in the last 168 hours. BNP (last 3 results) No results for input(s): PROBNP in the last 8760 hours. HbA1C: No results for input(s): HGBA1C in the last 72 hours. CBG: Recent Labs  Lab 09/04/20 2023 09/04/20 2305 09/05/20 0322 09/05/20 0844 09/05/20 1213  GLUCAP 149* 163* 91 99 135*   Lipid Profile: No results for input(s): CHOL, HDL, LDLCALC, TRIG, CHOLHDL, LDLDIRECT in the last 72 hours. Thyroid Function Tests: No results for input(s): TSH, T4TOTAL, FREET4, T3FREE, THYROIDAB in the last 72 hours. Anemia Panel: Recent Labs    09/05/20 0855  RETICCTPCT 2.2   Sepsis Labs: No results for input(s): PROCALCITON, LATICACIDVEN in the last 168 hours.  Recent Results (from the past 240  hour(s))  SARS CORONAVIRUS 2 (TAT 6-24 HRS) Nasopharyngeal     Status: None   Collection Time: 09/03/20 12:46 PM   Specimen: Nasopharyngeal  Result Value Ref Range Status   SARS Coronavirus 2 NEGATIVE NEGATIVE Final    Comment: (NOTE) SARS-CoV-2 target nucleic acids are NOT DETECTED.  The SARS-CoV-2 RNA is generally detectable in upper and lower respiratory specimens during the acute phase of infection. Negative results do not preclude SARS-CoV-2 infection, do not rule out co-infections with other pathogens, and should not be used as the sole basis for treatment or other patient management decisions. Negative results must be combined with clinical observations, patient history, and epidemiological information. The expected result is Negative.  Fact Sheet for Patients: SugarRoll.be  Fact Sheet for Healthcare Providers: https://www.woods-mathews.com/  This test is not yet approved or cleared by the [place] and  has been authorized for detection and/or diagnosis of SARS-CoV-2 by FDA under an Emergency Use Authorization (EUA). This EUA will remain  in effect (meaning this test can be used) for the duration of the COVID-19 declaration under Se ction 564(b)(1) of the Act, 21 U.S.C. section 360bbb-3(b)(1), unless the authorization is terminated or revoked sooner.  Performed at [facility], [place] [address] [place], [place] [address]          Radiology Studies: MR ABDOMEN MRCP WO CONTRAST  Result Date: 09/04/2020 CLINICAL DATA:  CT demonstrating mild biliary duct dilatation. Abdominal pain. Elevated bilirubin. EXAM: MRI ABDOMEN WITHOUT CONTRAST  (INCLUDING MRCP) TECHNIQUE: Multiplanar multisequence MR imaging of the abdomen was performed. Heavily T2-weighted images of the biliary and pancreatic ducts were obtained, and three-dimensional MRCP images were rendered by post processing. COMPARISON:  CT of the abdomen of 1 day  prior. FINDINGS: Lower chest: Normal heart size without pericardial or pleural effusion. Hepatobiliary: Normal noncontrast appearance of the liver. No gallstones or acute pericholecystic fluid. The intrahepatic ducts are upper normal, similar. The common duct measures maximally 8 mm in the porta hepatis on 16/3 versus 9 mm on the prior exam (when remeasured). Tapers in the region of the pancreatic head, without obstructive stone or mass. Pancreas: Borderline prominence of the pancreatic duct is similar over multiple prior exams. No acute inflammation. Spleen:  Normal in size, without focal abnormality. Adrenals/Urinary Tract: Normal adrenal glands. Normal noncontrast appearance of the kidneys for age, without hydronephrosis. Stomach/Bowel: Normal stomach and abdominal bowel loops. Vascular/Lymphatic: Normal aortic caliber. No abdominal adenopathy. Other:  No ascites. Musculoskeletal: S shaped thoracolumbar spine curvature. IMPRESSION: 1. Minimal improvement in common duct caliber, currently upper normal for age. No obstructive stone or mass. No explanation for elevated bilirubin. 2.  No acute abdominal process. Electronically Signed   By: Abigail Miyamoto M.D.   On: 09/04/2020 15:47   ECHOCARDIOGRAM COMPLETE  Result Date: 09/04/2020    ECHOCARDIOGRAM REPORT   Patient Name:   Bryan Scott Date of Exam: 09/04/2020 Medical Rec #:  [id number]    Height:       72.0 in Accession #:    [id number]   Weight:       175.2 lb Date of Birth:  01-18-1946     BSA:          2.014 m Patient Age:    33 years     BP:           121/78 mmHg Patient Gender: M            HR:           69 bpm. Exam Location:  [facility] Procedure: 2D Echo, Color Doppler, Cardiac Doppler and Intracardiac            Opacification Agent Indications:     I25.10 CAD Native Vessel  History:         Patient has prior history of Echocardiogram examinations. ICM,                  Prior CABG; Risk Factors:Former Smoker, Hypertension, Diabetes                  and  Dyslipidemia.  Sonographer:     Charmayne Sheer RDCS (AE) Referring Phys:  [id number] CHRISTOPHER END Diagnosing Phys: Nelva Bush MD  Sonographer Comments: Technically difficult study due to poor echo windows.  IMPRESSIONS  1. Left ventricular ejection fraction, by estimation, is 70 to 75%. The left ventricle has hyperdynamic function. Left ventricular endocardial border not optimally defined to evaluate regional wall motion. Left ventricular diastolic parameters are consistent with Grade I diastolic dysfunction (impaired relaxation).  2. Right ventricular systolic function is normal. The right ventricular size is normal.  3. The mitral valve is grossly normal. Trivial mitral valve regurgitation. No evidence of mitral stenosis.  4. The aortic valve is tricuspid. There is mild calcification of the aortic valve. There is mild thickening of the aortic valve. Aortic valve regurgitation is not visualized. Mild to moderate aortic valve sclerosis/calcification is present, without any evidence of aortic stenosis.  5. The inferior vena cava is normal in size with greater than 50% respiratory variability, suggesting right atrial pressure of 3 mmHg. FINDINGS  Left Ventricle: Left ventricular ejection fraction, by estimation, is 70 to 75%. The left ventricle has hyperdynamic function. Left ventricular endocardial border not optimally defined to evaluate regional wall motion. Definity contrast agent was given IV to delineate the left ventricular endocardial borders. The left ventricular internal cavity size was normal in size. There is no left ventricular hypertrophy. Left ventricular diastolic parameters are consistent with Grade I diastolic dysfunction (impaired relaxation). Right Ventricle: The right ventricular size is normal. No increase in right ventricular wall thickness. Right ventricular systolic function is normal. Left Atrium: Left atrial size was normal in size. Right Atrium: Right atrial size was normal in size.  Pericardium: There is no evidence of pericardial effusion. Mitral Valve: The mitral valve is grossly normal. Trivial mitral valve regurgitation. No evidence of mitral valve stenosis. MV peak gradient, 3.7 mmHg. The mean mitral valve gradient is 2.0 mmHg. Tricuspid Valve: The tricuspid valve is not well visualized. Tricuspid valve regurgitation is trivial. Aortic Valve: The aortic valve is tricuspid. There is mild calcification of the aortic valve. There is mild thickening of the aortic valve. Aortic valve regurgitation is not visualized. Mild to moderate aortic valve sclerosis/calcification is present, without any evidence of aortic stenosis. Aortic valve mean gradient measures 4.0 mmHg. Aortic valve peak gradient measures 8.6 mmHg. Aortic valve area, by VTI measures 2.16 cm. Pulmonic Valve: The pulmonic valve was grossly normal. Pulmonic valve regurgitation is not visualized. No evidence of pulmonic stenosis. Aorta: The aortic root is normal in size and structure. Pulmonary Artery: The pulmonary artery is of normal size. Venous: The inferior vena cava is normal in size with greater than 50% respiratory variability, suggesting right atrial pressure of 3 mmHg. IAS/Shunts: The interatrial septum was not well visualized.  LEFT VENTRICLE PLAX 2D LVIDd:         4.00 cm  Diastology LVIDs:         3.00 cm  LV e' lateral:   7.51 cm/s LV PW:         0.80 cm  LV E/e' lateral: 8.4 LV IVS:        0.80 cm LVOT diam:     2.00 cm LV SV:         52 LV SV Index:   26 LVOT Area:     3.14 cm  RIGHT VENTRICLE RV Basal diam:  3.72 cm LEFT ATRIUM         Index      RIGHT ATRIUM           Index LA diam:    2.60 cm 1.29 cm/m RA Area:     10.75 cm 5.34 cm/m  AORTIC VALVE  PULMONIC VALVE AV Area (Vmax):    2.22 cm    PV Vmax:       1.37 m/s AV Area (Vmean):   2.17 cm    PV Vmean:      85.800 cm/s AV Area (VTI):     2.16 cm    PV VTI:        0.208 m AV Vmax:           147.00 cm/s PV Peak grad:  7.5 mmHg AV Vmean:           97.000 cm/s PV Mean grad:  3.0 mmHg AV VTI:            0.241 m AV Peak Grad:      8.6 mmHg AV Mean Grad:      4.0 mmHg LVOT Vmax:         104.00 cm/s LVOT Vmean:        67.000 cm/s LVOT VTI:          0.166 m LVOT/AV VTI ratio: 0.69  AORTA Ao Root diam: 3.30 cm MITRAL VALVE MV Area (PHT): 6.96 cm    SHUNTS MV Area VTI:   2.94 cm    Systemic VTI:  0.17 m MV Peak grad:  3.7 mmHg    Systemic Diam: 2.00 cm MV Mean grad:  2.0 mmHg MV Vmax:       0.96 m/s MV Vmean:      63.9 cm/s MV Decel Time: 109 msec MV E velocity: 63.40 cm/s MV A velocity: 99.00 cm/s MV E/A ratio:  0.64 Christopher End MD Electronically signed by Nelva Bush MD Signature Date/Time: 09/04/2020/5:11:00 PM    Final         Scheduled Meds: . atorvastatin  80 mg Oral Daily  . carvedilol  3.125 mg Oral BID WC  . clopidogrel  75 mg Oral q morning  . ezetimibe  10 mg Oral Daily  . gabapentin  100 mg Oral TID  . insulin aspart  0-9 Units Subcutaneous Q4H  . isosorbide mononitrate  30 mg Oral Daily  . lactulose  20 g Oral Once  . multivitamin with minerals  1 tablet Oral Daily  . pantoprazole  40 mg Oral BID  . rivaroxaban  20 mg Oral Q supper  . sucralfate  1 g Oral TID WC & HS   Continuous Infusions: . sodium chloride 100 mL/hr at 09/05/20 1008     LOS: 2 days    Time spent: 28 minutes    Sharen Hones, MD [facility]   To contact the attending provider between 7A-7P or the covering provider during after hours 7P-7A, please log into the web site [email or web address] and access using universal [place] password for that web site. If you do not have the password, please call the hospital operator.  09/05/2020, 2:26 PM

## 2020-09-05 NOTE — TOC Initial Note (Signed)
Transition of Care [facility]) - Initial/Assessment Note    Patient Details  Name: Bryan Scott MRN: [id number] Date of Birth: 19-Nov-1945  Transition of Care [facility]) CM/SW Contact:    Kerin Salen, RN Phone Number: 09/05/2020, 1:22 PM  Clinical Narrative:  Spoke with patient who voices living in [place] alone, uses cane however have a rolling walker. VA benefits, who will build a ramp for him soon. VA sends medications via mailorder. Sister, Arlyn Leak assist with shopping and cooking however he can warm up soup in microwave. Patient voices independence with ADL's and has a Biomedical engineer. Use [facility] for RN/PT will like to continue services. I did receive text from [place], agrees to resume services when discharged.                 Expected Discharge Plan: [place] Barriers to Discharge: Continued Medical Work up   Patient Goals and CMS Choice Patient states their goals for this hospitalization and ongoing recovery are:: To return home and continue with current [facility] services.   Choice offered to / list presented to : NA  Expected Discharge Plan and Services Expected Discharge Plan: [place] In-house Referral: Clinical Social Work Discharge Planning Services: NA Post Acute Care Choice: [place] arrangements for the past 2 months: [place] Home                 DME Arranged: N/A DME Agency: NA       HH Arranged: PT,RN [place]: [facility] Date [place]: 09/05/20 Time [place]: 1321 Representative spoke with at [place]: [place] Arrangements/Services Living arrangements for the past 2 months: [place] Lives with:: Self   Do you feel safe going back to the place where you live?: Yes      Need for Family Participation in Patient Care: Yes (Comment) Care giver support system in place?: Yes (comment) Current home services: Home PT,Home RN Criminal Activity/Legal  Involvement Pertinent to Current Situation/Hospitalization: No - Comment as needed  Activities of Daily Living Home Assistive Devices/Equipment: CBG Meter,Blood pressure cuff,Walker (specify type),Eyeglasses ADL Screening (condition at time of admission) Patient's cognitive ability adequate to safely complete daily activities?: Yes Is the patient deaf or have difficulty hearing?: No Does the patient have difficulty seeing, even when wearing glasses/contacts?: No Does the patient have difficulty concentrating, remembering, or making decisions?: No Patient able to express need for assistance with ADLs?: Yes Does the patient have difficulty dressing or bathing?: No Independently performs ADLs?: Yes (appropriate for developmental age) Does the patient have difficulty walking or climbing stairs?: No Weakness of Legs: None Weakness of Arms/Hands: None  Permission Sought/Granted                  Emotional Assessment Appearance:: Appears stated age Attitude/Demeanor/Rapport: Engaged Affect (typically observed): Accepting Orientation: : Oriented to Self,Oriented to Place,Oriented to  Time,Oriented to Situation Alcohol / Substance Use: Not Applicable Psych Involvement: No (comment)  Admission diagnosis:  Epigastric pain [R10.13] Chest pain [R07.9] Patient Active Problem List   Diagnosis Date Noted  . History of pancreatitis   . Cervical radiculopathy   . Acute pancreatitis 08/07/2020  . Hyperlipidemia associated with type 2 diabetes mellitus ([place]) 08/07/2020  . Syncope 08/07/2020  . NSTEMI (non-ST elevated myocardial infarction) ([place]) 07/11/2020  . AKI (acute kidney injury) ([place]) 07/11/2020  . Acute metabolic encephalopathy 54/62/7035  . GERD (gastroesophageal reflux disease)  09/09/2019  . CAD (coronary artery disease) 09/09/2019  . Chronic pain syndrome 09/09/2019  . Leukocytosis 09/09/2019  . Shortness of breath   . Hyponatremia 09/26/2016  . Chest pain 09/22/2016  . Status  post coronary artery bypass grafting 09/21/2016  . Abnormal EKG 09/21/2016  . Chronic pain 09/21/2016  . PAF (paroxysmal atrial fibrillation) ([place]) 09/21/2016  . Essential hypertension 05/02/2016  . Carotid stenosis 05/02/2016  . CAD in native artery   . Atypical chest pain   . Type 2 diabetes mellitus ([place])   . Hyperlipidemia   . Pain of left upper extremity   . Angina pectoris ([place])   . Abdominal pain   . Chest pain, central 03/13/2015  . Hypertensive urgency 03/13/2015  . Spinal stenosis 01/10/2012   PCP:  [name], MD Pharmacy:   [place], [place] [address] Phone: [phone] Fax: [phone]     Social Determinants of Health (SDOH) Interventions    Readmission Risk Interventions No flowsheet data found.

## 2020-09-05 NOTE — Progress Notes (Signed)
Progress Note  Patient Name: Bryan Scott Date of Encounter: 09/05/2020  [facility] Cardiologist: [place]   Denies chest pain or shortness of breath, still has nausea, vomiting, abdominal discomfort.  Had MRCP with no biliary obstruction noted  Inpatient Medications    Scheduled Meds: . atorvastatin  80 mg Oral Daily  . carvedilol  3.125 mg Oral BID WC  . clopidogrel  75 mg Oral q morning  . ezetimibe  10 mg Oral Daily  . gabapentin  100 mg Oral TID  . insulin aspart  0-9 Units Subcutaneous Q4H  . isosorbide mononitrate  30 mg Oral Daily  . multivitamin with minerals  1 tablet Oral Daily  . pantoprazole (PROTONIX) IV  40 mg Intravenous Q24H  . rivaroxaban  20 mg Oral Q supper   Continuous Infusions: . sodium chloride 100 mL/hr at 09/05/20 1008   PRN Meds: morphine injection, nitroGLYCERIN, ondansetron **OR** ondansetron (ZOFRAN) IV, traZODone   Vital Signs    Vitals:   09/04/20 1924 09/05/20 0823 09/05/20 1100 09/05/20 1131  BP: (!) 143/82 122/76  (!) 102/58  Pulse: 82 73  73  Resp: 17   17  Temp: 97.6 F (36.4 C) 97.6 F (36.4 C)  97.8 F (36.6 C)  TempSrc: Oral Oral  Oral  SpO2: 100% 98%  97%  Weight:   85.1 kg   Height:        Intake/Output Summary (Last 24 hours) at 09/05/2020 1151 Last data filed at 09/05/2020 0800 Gross per 24 hour  Intake 400 ml  Output 2000 ml  Net -1600 ml   Last 3 Weights 09/05/2020 09/04/2020 09/03/2020  Weight (lbs) 187 lb 9.6 oz 175 lb 3.2 oz 179 lb  Weight (kg) 85.095 kg 79.47 kg 81.194 kg      Telemetry    Sinus rhythm- Personally Reviewed  ECG    No new tracing obtained- Personally Reviewed  Physical Exam   GEN: No acute distress.   Neck: No JVD Cardiac: RRR, no murmurs, rubs, or gallops.  Respiratory: Clear to auscultation bilaterally. GI: Soft, nontender, non-distended  MS: No edema; No deformity. Neuro:  Nonfocal  Psych: Normal affect   Labs    High Sensitivity Troponin:    Recent Labs  Lab 08/07/20 1538 08/07/20 1725 09/03/20 0921 09/03/20 1019  TROPONINIHS 9 9 5 7       Chemistry Recent Labs  Lab 09/03/20 0921 09/04/20 0431 09/05/20 0855  NA 129* 134* 134*  K 3.6 3.2* 3.8  CL 95* 102 103  CO2 20* 19* 20*  GLUCOSE 159* 83 100*  BUN 19 22 19   CREATININE 1.27* 1.19 1.05  CALCIUM 9.9 8.9 8.8*  PROT 8.5* 6.5 6.6  ALBUMIN 4.5 3.6 3.6  AST 25 18 18   ALT 25 19 18   ALKPHOS 86 64 68  BILITOT 2.6*  3.0* 3.2* 2.9*  GFRNONAA 59* >60 >60  ANIONGAP 14 13 11      Hematology Recent Labs  Lab 09/03/20 0921 09/04/20 0431 09/05/20 0855  WBC 9.3 9.7  --   RBC 5.77 4.98 4.73  HGB 19.1* 16.4  --   HCT 54.2* 46.7  --   MCV 93.9 93.8  --   MCH 33.1 32.9  --   MCHC 35.2 35.1  --   RDW 12.6 12.7  --   PLT 224 204  --     BNPNo results for input(s): BNP, PROBNP in the last 168 hours.   DDimer No results for  input(s): DDIMER in the last 168 hours.   Radiology    MR ABDOMEN MRCP WO CONTRAST  Result Date: 09/04/2020 CLINICAL DATA:  CT demonstrating mild biliary duct dilatation. Abdominal pain. Elevated bilirubin. EXAM: MRI ABDOMEN WITHOUT CONTRAST  (INCLUDING MRCP) TECHNIQUE: Multiplanar multisequence MR imaging of the abdomen was performed. Heavily T2-weighted images of the biliary and pancreatic ducts were obtained, and three-dimensional MRCP images were rendered by post processing. COMPARISON:  CT of the abdomen of 1 day prior. FINDINGS: Lower chest: Normal heart size without pericardial or pleural effusion. Hepatobiliary: Normal noncontrast appearance of the liver. No gallstones or acute pericholecystic fluid. The intrahepatic ducts are upper normal, similar. The common duct measures maximally 8 mm in the porta hepatis on 16/3 versus 9 mm on the prior exam (when remeasured). Tapers in the region of the pancreatic head, without obstructive stone or mass. Pancreas: Borderline prominence of the pancreatic duct is similar over multiple prior exams. No  acute inflammation. Spleen:  Normal in size, without focal abnormality. Adrenals/Urinary Tract: Normal adrenal glands. Normal noncontrast appearance of the kidneys for age, without hydronephrosis. Stomach/Bowel: Normal stomach and abdominal bowel loops. Vascular/Lymphatic: Normal aortic caliber. No abdominal adenopathy. Other:  No ascites. Musculoskeletal: S shaped thoracolumbar spine curvature. IMPRESSION: 1. Minimal improvement in common duct caliber, currently upper normal for age. No obstructive stone or mass. No explanation for elevated bilirubin. 2.  No acute abdominal process. Electronically Signed   By: Abigail Miyamoto M.D.   On: 09/04/2020 15:47   ECHOCARDIOGRAM COMPLETE  Result Date: 09/04/2020    ECHOCARDIOGRAM REPORT   Patient Name:   [name] Date of Exam: 09/04/2020 Medical Rec #:  [id number]    Height:       72.0 in Accession #:    [id number]   Weight:       175.2 lb Date of Birth:  01-10-1946     BSA:          2.014 m Patient Age:    2 years     BP:           121/78 mmHg Patient Gender: M            HR:           69 bpm. Exam Location:  [facility] Procedure: 2D Echo, Color Doppler, Cardiac Doppler and Intracardiac            Opacification Agent Indications:     I25.10 CAD Native Vessel  History:         Patient has prior history of Echocardiogram examinations. ICM,                  Prior CABG; Risk Factors:Former Smoker, Hypertension, Diabetes                  and Dyslipidemia.  Sonographer:     Charmayne Sheer RDCS (AE) Referring Phys:  [id number] CHRISTOPHER END Diagnosing Phys: Nelva Bush MD  Sonographer Comments: Technically difficult study due to poor echo windows. IMPRESSIONS  1. Left ventricular ejection fraction, by estimation, is 70 to 75%. The left ventricle has hyperdynamic function. Left ventricular endocardial border not optimally defined to evaluate regional wall motion. Left ventricular diastolic parameters are consistent with Grade I diastolic dysfunction (impaired relaxation).  2. Right  ventricular systolic function is normal. The right ventricular size is normal.  3. The mitral valve is grossly normal. Trivial mitral valve regurgitation. No evidence of mitral stenosis.  4. The aortic valve is tricuspid. There  is mild calcification of the aortic valve. There is mild thickening of the aortic valve. Aortic valve regurgitation is not visualized. Mild to moderate aortic valve sclerosis/calcification is present, without any evidence of aortic stenosis.  5. The inferior vena cava is normal in size with greater than 50% respiratory variability, suggesting right atrial pressure of 3 mmHg. FINDINGS  Left Ventricle: Left ventricular ejection fraction, by estimation, is 70 to 75%. The left ventricle has hyperdynamic function. Left ventricular endocardial border not optimally defined to evaluate regional wall motion. Definity contrast agent was given IV to delineate the left ventricular endocardial borders. The left ventricular internal cavity size was normal in size. There is no left ventricular hypertrophy. Left ventricular diastolic parameters are consistent with Grade I diastolic dysfunction (impaired relaxation). Right Ventricle: The right ventricular size is normal. No increase in right ventricular wall thickness. Right ventricular systolic function is normal. Left Atrium: Left atrial size was normal in size. Right Atrium: Right atrial size was normal in size. Pericardium: There is no evidence of pericardial effusion. Mitral Valve: The mitral valve is grossly normal. Trivial mitral valve regurgitation. No evidence of mitral valve stenosis. MV peak gradient, 3.7 mmHg. The mean mitral valve gradient is 2.0 mmHg. Tricuspid Valve: The tricuspid valve is not well visualized. Tricuspid valve regurgitation is trivial. Aortic Valve: The aortic valve is tricuspid. There is mild calcification of the aortic valve. There is mild thickening of the aortic valve. Aortic valve regurgitation is not visualized. Mild to  moderate aortic valve sclerosis/calcification is present, without any evidence of aortic stenosis. Aortic valve mean gradient measures 4.0 mmHg. Aortic valve peak gradient measures 8.6 mmHg. Aortic valve area, by VTI measures 2.16 cm. Pulmonic Valve: The pulmonic valve was grossly normal. Pulmonic valve regurgitation is not visualized. No evidence of pulmonic stenosis. Aorta: The aortic root is normal in size and structure. Pulmonary Artery: The pulmonary artery is of normal size. Venous: The inferior vena cava is normal in size with greater than 50% respiratory variability, suggesting right atrial pressure of 3 mmHg. IAS/Shunts: The interatrial septum was not well visualized.  LEFT VENTRICLE PLAX 2D LVIDd:         4.00 cm  Diastology LVIDs:         3.00 cm  LV e' lateral:   7.51 cm/s LV PW:         0.80 cm  LV E/e' lateral: 8.4 LV IVS:        0.80 cm LVOT diam:     2.00 cm LV SV:         52 LV SV Index:   26 LVOT Area:     3.14 cm  RIGHT VENTRICLE RV Basal diam:  3.72 cm LEFT ATRIUM         Index      RIGHT ATRIUM           Index LA diam:    2.60 cm 1.29 cm/m RA Area:     10.75 cm 5.34 cm/m  AORTIC VALVE                   PULMONIC VALVE AV Area (Vmax):    2.22 cm    PV Vmax:       1.37 m/s AV Area (Vmean):   2.17 cm    PV Vmean:      85.800 cm/s AV Area (VTI):     2.16 cm    PV VTI:        0.208 m AV  Vmax:           147.00 cm/s PV Peak grad:  7.5 mmHg AV Vmean:          97.000 cm/s PV Mean grad:  3.0 mmHg AV VTI:            0.241 m AV Peak Grad:      8.6 mmHg AV Mean Grad:      4.0 mmHg LVOT Vmax:         104.00 cm/s LVOT Vmean:        67.000 cm/s LVOT VTI:          0.166 m LVOT/AV VTI ratio: 0.69  AORTA Ao Root diam: 3.30 cm MITRAL VALVE MV Area (PHT): 6.96 cm    SHUNTS MV Area VTI:   2.94 cm    Systemic VTI:  0.17 m MV Peak grad:  3.7 mmHg    Systemic Diam: 2.00 cm MV Mean grad:  2.0 mmHg MV Vmax:       0.96 m/s MV Vmean:      63.9 cm/s MV Decel Time: 109 msec MV E velocity: 63.40 cm/s MV A velocity:  99.00 cm/s MV E/A ratio:  0.64 Harrell Gave End MD Electronically signed by Nelva Bush MD Signature Date/Time: 09/04/2020/5:11:00 PM    Final     Cardiac Studies  Echo 09/04/2020 1. Left ventricular ejection fraction, by estimation, is 70 to 75%. The  left ventricle has hyperdynamic function. Left ventricular endocardial  border not optimally defined to evaluate regional wall motion. Left  ventricular diastolic parameters are  consistent with Grade I diastolic dysfunction (impaired relaxation).  2. Right ventricular systolic function is normal. The right ventricular  size is normal.  3. The mitral valve is grossly normal. Trivial mitral valve  regurgitation. No evidence of mitral stenosis.  4. The aortic valve is tricuspid. There is mild calcification of the  aortic valve. There is mild thickening of the aortic valve. Aortic valve  regurgitation is not visualized. Mild to moderate aortic valve  sclerosis/calcification is present, without any  evidence of aortic stenosis.  5. The inferior vena cava is normal in size with greater than 50%  respiratory variability, suggesting right atrial pressure of 3 mmHg.   LHC 07/13/2020 Conclusions: 1. Multivessel coronary artery and bypass graft disease, as detailed in Dr. Donivan Scull diagnostic catheterization report. Culprit lesion appears to be 95% stenosis in SVG-RCA. 2. Successful PCI to SVG-RCA using Resolute Onyx 3.5 x 18 mm drug-eluting stent with 0% residual stenosis and TIMI-3 flow.  Recommendations: 1. Remove right femoral artery sheath 2 hours after discontinuation of bivalirudin. 2. Restart heparin 8 hours after sheath removal. Transition to rivaroxaban tomorrow if no evidence of bleeding/vascular complication. 3. Continue clopidogrel for 12 months (defer aspirin in the setting of aspirin allergy on chronic anticoagulation with rivaroxaban). 4. Aggressive secondary prevention   Patient Profile     75 y.o. male history of  CAD/CABG x3, diabetes, paroxysmal atrial fibrillation presenting with a 3-week history of nausea, vomiting, syncope.  Assessment & Plan    1.  History of CAD/CABG, recent stent SVG-PCI -Troponins normal, denies chest pain -Plavix, Lipitor, Coreg -Echo this admission with preserved EF -No additional cardiac testing indicated at this time.  2.  Paroxysmal atrial fibrillation -CHA2DS2-VASc score 6 -Should be on Xarelto 20 mg daily.  Unsure why this was stopped.  Denies any bleeding issues. -Restart Xarelto 20 mg daily if no other testing/invasive procedures are planned. -Continue Coreg as above.  3.  Nausea,  vomiting, abdominal pain -Dehydration likely due to syncopal episode -Management as per primary team, GI  Total encounter time 35 minutes  Greater than 50% was spent in counseling and coordination of care with the patient      Signed, Kate Sable, MD  09/05/2020, 11:51 AM

## 2020-09-06 LAB — CBC WITH DIFFERENTIAL/PLATELET
Abs Immature Granulocytes: 0.03 10*3/uL (ref 0.00–0.07)
Basophils Absolute: 0.1 10*3/uL (ref 0.0–0.1)
Basophils Relative: 1 %
Eosinophils Absolute: 0.3 10*3/uL (ref 0.0–0.5)
Eosinophils Relative: 3 %
HCT: 41.8 % (ref 39.0–52.0)
Hemoglobin: 14.6 g/dL (ref 13.0–17.0)
Immature Granulocytes: 0 %
Lymphocytes Relative: 28 %
Lymphs Abs: 2.3 10*3/uL (ref 0.7–4.0)
MCH: 33.2 pg (ref 26.0–34.0)
MCHC: 34.9 g/dL (ref 30.0–36.0)
MCV: 95 fL (ref 80.0–100.0)
Monocytes Absolute: 0.7 10*3/uL (ref 0.1–1.0)
Monocytes Relative: 8 %
Neutro Abs: 4.9 10*3/uL (ref 1.7–7.7)
Neutrophils Relative %: 60 %
Platelets: 161 10*3/uL (ref 150–400)
RBC: 4.4 MIL/uL (ref 4.22–5.81)
RDW: 12.8 % (ref 11.5–15.5)
WBC: 8.3 10*3/uL (ref 4.0–10.5)
nRBC: 0 % (ref 0.0–0.2)

## 2020-09-06 LAB — BASIC METABOLIC PANEL
Anion gap: 10 (ref 5–15)
BUN: 14 mg/dL (ref 8–23)
CO2: 21 mmol/L — ABNORMAL LOW (ref 22–32)
Calcium: 8.8 mg/dL — ABNORMAL LOW (ref 8.9–10.3)
Chloride: 104 mmol/L (ref 98–111)
Creatinine, Ser: 1 mg/dL (ref 0.61–1.24)
GFR, Estimated: >60 mL/min (ref >60)
Glucose, Bld: 96 mg/dL (ref 70–99)
Potassium: 3.4 mmol/L — ABNORMAL LOW (ref 3.5–5.1)
Sodium: 135 mmol/L (ref 135–145)

## 2020-09-06 LAB — GLUCOSE, CAPILLARY
Glucose-Capillary: 114 mg/dL — ABNORMAL HIGH (ref 70–99)
Glucose-Capillary: 99 mg/dL (ref 70–99)
Glucose-Capillary: 150 mg/dL — ABNORMAL HIGH (ref 70–99)

## 2020-09-06 LAB — HAPTOGLOBIN: Haptoglobin: 86 mg/dL (ref 34–355)

## 2020-09-06 LAB — MAGNESIUM: Magnesium: 1.7 mg/dL (ref 1.7–2.4)

## 2020-09-06 MED ORDER — LACTULOSE 20 GM/30ML PO SOLN: 20.0000 g | Freq: Every day | ORAL | 0 refills | Status: DC | PRN

## 2020-09-06 MED ORDER — PANTOPRAZOLE SODIUM 40 MG PO TBEC: 40.0000 mg | DELAYED_RELEASE_TABLET | Freq: Every day | ORAL | 0 refills | Status: DC

## 2020-09-06 MED ORDER — SENNOSIDES-DOCUSATE SODIUM 8.6-50 MG PO TABS: 2.0000 | ORAL_TABLET | Freq: Two times a day (BID) | ORAL | 0 refills | Status: AC

## 2020-09-06 MED ORDER — RIVAROXABAN 20 MG PO TABS: 20.0000 mg | ORAL_TABLET | Freq: Every day | ORAL | 0 refills | Status: DC

## 2020-09-06 MED ORDER — POTASSIUM CHLORIDE 20 MEQ PO PACK
40.0000 meq | PACK | Freq: Once | ORAL | Status: AC
  Administered 2020-09-06: 40 meq via ORAL
  Filled 2020-09-06: qty 2

## 2020-09-06 MED ORDER — SUCRALFATE 1 G PO TABS: 1.0000 g | ORAL_TABLET | Freq: Three times a day (TID) | ORAL | 0 refills | Status: DC

## 2020-09-06 NOTE — Discharge Summary (Signed)
Physician Discharge Summary  Patient ID: Bryan Scott MRN: [id number] DOB/AGE: 1946/04/14 75 y.o.  Admit date: 09/03/2020 Discharge date: 09/06/2020  Admission Diagnoses:  Discharge Diagnoses:  Principal Problem:   Chest pain Active Problems:   Type 2 diabetes mellitus (HCC)   Essential hypertension   PAF (paroxysmal atrial fibrillation) (HCC)   Hyponatremia   History of pancreatitis   Discharged Condition: good  Hospital Course:  Bryan Scott a 75 y.o.malewith medical history significantforCAD s/p CABG (recent admission for NSTEMI s/p PCI with DES to SVG-RCA 07/13/2020), PAF on Xarelto, T2DM, HTN, HLD, CAS, cervical spinal stenosis s/p C5-C7 ACDF, chronic pain syndrome, recent hospitalization for acute pancreatitiswho presents to the ED viaEMSfor evaluation of chest pain. Patient also complaining of lower abdominal pain, nausea vomiting, poor appetite.  He also has significant constipation alternating with diarrhea.  Last bowel movement was 4 days ago. He had abnormal liver function, MRCP did not show any common bile duct obstruction, he has been seen by GI. Patient was also seen by cardiology for chest pain, no work-up is needed at this time.   #1.  Abdominal pain or nausea vomiting. Patient had a significant weight loss over the last few months, he has constipation alternating with diarrhea.  He will need EGD and colonoscopy as outpatient.  He will follow up with GI as outpatient. MRCP does not show any common bile duct obstruction. For symptomatic relief, I will place him on PPI, sucralfate as well as stool softeners.  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 Patient condition has improved today, no additional abdominal pain or nausea vomiting.  Had a large volume this morning.  2.  Chest pain. .  Noncardiac.  3.  Paroxysmal atrial fibrillation. Continue anticoagulation.  4.  Hyponatremia Improved.  Will discontinue Aldactone  for now.  5.  Type 2 diabetes. Resume Metformin    Consults: cardiology  Significant Diagnostic Studies:  Echo: Left ventricular ejection fraction, by estimation, is 70 to 75%. The left ventricle has hyperdynamic function. Left ventricular endocardial border not optimally defined to evaluate regional wall motion. Left ventricular diastolic parameters are consistent with Grade I diastolic dysfunction (impaired relaxation). 2. Right ventricular systolic function is normal. The right ventricular size is normal. 3. The mitral valve is grossly normal. Trivial mitral valve regurgitation. No evidence of mitral stenosis. 4. The aortic valve is tricuspid. There is mild calcification of the aortic valve. There is mild thickening of the aortic valve. Aortic valve regurgitation is not visualized. Mild to moderate aortic valve sclerosis/calcification is present, without any evidence of aortic stenosis. 5. The inferior vena cava is normal in size with greater than 50% respiratory variability, suggesting right atrial pressure of 3 mmHg. MRI ABDOMEN WITHOUT CONTRAST  (INCLUDING MRCP)  TECHNIQUE: Multiplanar multisequence MR imaging of the abdomen was performed. Heavily T2-weighted images of the biliary and pancreatic ducts were obtained, and three-dimensional MRCP images were rendered by post processing.  COMPARISON:  CT of the abdomen of 1 day prior.  FINDINGS: Lower chest: Normal heart size without pericardial or pleural effusion.  Hepatobiliary: Normal noncontrast appearance of the liver. No gallstones or acute pericholecystic fluid. The intrahepatic ducts are upper normal, similar. The common duct measures maximally 8 mm in the porta hepatis on 16/3 versus 9 mm on the prior exam (when remeasured). Tapers in the region of the pancreatic head, without obstructive stone or mass.  Pancreas: Borderline prominence of the pancreatic duct is similar over multiple prior exams.  No acute  inflammation.  Spleen:  Normal in size, without focal abnormality.  Adrenals/Urinary Tract: Normal adrenal glands. Normal noncontrast appearance of the kidneys for age, without hydronephrosis.  Stomach/Bowel: Normal stomach and abdominal bowel loops.  Vascular/Lymphatic: Normal aortic caliber. No abdominal adenopathy.  Other:  No ascites.  Musculoskeletal: S shaped thoracolumbar spine curvature.  IMPRESSION: 1. Minimal improvement in common duct caliber, currently upper normal for age. No obstructive stone or mass. No explanation for elevated bilirubin. 2.  No acute abdominal process.   Electronically Signed   By: Abigail Miyamoto M.D.   On: 09/04/2020 15:47 CT ANGIOGRAPHY CHEST  CT ABDOMEN AND PELVIS WITH CONTRAST  TECHNIQUE: Multidetector CT imaging of the chest was performed using the standard protocol during bolus administration of intravenous contrast. Multiplanar CT image reconstructions and MIPs were obtained to evaluate the vascular anatomy. Multidetector CT imaging of the abdomen and pelvis was performed using the standard protocol during bolus administration of intravenous contrast.  CONTRAST:  35mL OMNIPAQUE IOHEXOL 350 MG/ML SOLN  COMPARISON:  CT a chest, abdomen, and pelvis dated December 12, 2019.  FINDINGS: CTA CHEST FINDINGS  Cardiovascular: Satisfactory opacification of the pulmonary arteries to the segmental level. No evidence of pulmonary embolism. Normal heart size. No pericardial effusion. Prior CABG. Coronary, aortic arch, and branch vessel atherosclerotic vascular disease.  Mediastinum/Nodes: No enlarged mediastinal, hilar, or axillary lymph nodes. Thyroid gland, trachea, and esophagus demonstrate no significant findings.  Lungs/Pleura: Unchanged biapical pleuroparenchymal scarring. No focal consolidation, pleural effusion, or pneumothorax. No suspicious pulmonary nodule.  Musculoskeletal: Unchanged mild bilateral  gynecomastia. No acute or significant osseous findings.  Review of the MIP images confirms the above findings.  CT ABDOMEN AND PELVIS FINDINGS  Hepatobiliary: No focal liver abnormality is seen. No gallstones or gallbladder wall thickening. Mild intra- and extrahepatic biliary dilatation. Common bile duct measures 10 mm in diameter.  Pancreas: Unchanged mild prominence of the main pancreatic duct. No surrounding inflammatory changes.  Spleen: Normal in size without focal abnormality.  Adrenals/Urinary Tract: The adrenal glands are unremarkable. Unchanged 11 mm right renal calculus. No hydronephrosis or renal mass. The bladder is unremarkable.  Stomach/Bowel: Stomach is within normal limits. Appendix appears normal. No evidence of bowel wall thickening, distention, or inflammatory changes.  Vascular/Lymphatic: Aortic atherosclerosis. No enlarged abdominal or pelvic lymph nodes.  Reproductive: Prostate is unremarkable.  Other: No abdominal wall hernia or abnormality. No abdominopelvic ascites. No pneumoperitoneum.  Musculoskeletal: No acute or significant osseous findings.  Review of the MIP images confirms the above findings.  IMPRESSION: 1. No evidence of pulmonary embolism. No acute intrathoracic process. 2. Mild intra- and extrahepatic biliary dilatation with common bile duct measuring 10 mm in diameter. Correlation with LFTs is recommended. If there is clinical concern for biliary obstruction, consider further evaluation with MRCP or ERCP. 3. Unchanged 11 mm nonobstructive right nephrolithiasis. 4. Aortic Atherosclerosis (ICD10-I70.0).   Electronically Signed   By: Titus Dubin M.D.   On: 09/03/2020 12:03   Treatments: IV fluids,   Discharge Exam: Blood pressure (!) 149/82, pulse 72, temperature 97.8 F (36.6 C), resp. rate 19, height 6' (1.829 m), weight 81.8 kg, SpO2 97 %. General appearance: alert and cooperative Resp: clear to  auscultation bilaterally Cardio: regular rate and rhythm, S1, S2 normal, no murmur, click, rub or gallop GI: soft, non-tender; bowel sounds normal; no masses,  no organomegaly Extremities: extremities normal, atraumatic, no cyanosis or edema  Disposition: Discharge disposition: 01-Home or Self Care       Discharge Instructions  Diet - low sodium heart healthy   Complete by: As directed    Increase activity slowly   Complete by: As directed      Allergies as of 09/06/2020      Reactions   Aspergum [aspirin] Anaphylaxis   Heparin Other (See Comments)   Reaction:  Bleeding    Lisinopril Swelling      Medication List    STOP taking these medications   spironolactone 25 MG tablet Commonly known as: ALDACTONE     TAKE these medications   (feeding supplement) PROSource Plus liquid Take 30 mLs by mouth 2 (two) times daily between meals.   atorvastatin 80 MG tablet Commonly known as: LIPITOR Take 80 mg by mouth daily.   carvedilol 3.125 MG tablet Commonly known as: COREG Take 1 tablet (3.125 mg total) by mouth 2 (two) times daily with a meal.   clopidogrel 75 MG tablet Commonly known as: PLAVIX Take 1 tablet (75 mg total) by mouth daily with breakfast.   ezetimibe 10 MG tablet Commonly known as: ZETIA Take 1 tablet (10 mg total) by mouth daily.   gabapentin 100 MG capsule Commonly known as: NEURONTIN Take 100 mg by mouth 3 (three) times daily.   glipiZIDE 10 MG tablet Commonly known as: GLUCOTROL Take 10 mg by mouth 2 (two) times daily before a meal.   guaiFENesin-dextromethorphan 100-10 MG/5ML syrup Commonly known as: ROBITUSSIN DM Take 5 mLs by mouth every 4 (four) hours as needed for cough.   isosorbide mononitrate 30 MG 24 hr tablet Commonly known as: IMDUR Take 1 tablet (30 mg total) by mouth daily.   Jardiance 25 MG Tabs tablet Generic drug: empagliflozin Take 25 mg by mouth daily.   Lactulose 20 GM/30ML Soln Take 30 mLs (20 g total) by mouth  daily as needed.   metFORMIN 500 MG (MOD) 24 hr tablet Commonly known as: GLUMETZA Take 500 mg by mouth 2 (two) times daily with a meal.   morphine 15 MG 12 hr tablet Commonly known as: MS CONTIN Take 15 mg by mouth every 12 (twelve) hours as needed for pain.   multivitamin with minerals Tabs tablet Take 1 tablet by mouth daily.   nitroGLYCERIN 0.4 MG SL tablet Commonly known as: NITROSTAT Place 1 tablet (0.4 mg total) under the tongue every 5 (five) minutes as needed for chest pain.   pantoprazole 40 MG tablet Commonly known as: PROTONIX Take 1 tablet (40 mg total) by mouth daily.   rivaroxaban 20 MG Tabs tablet Commonly known as: XARELTO Take 1 tablet (20 mg total) by mouth daily with supper.   senna-docusate 8.6-50 MG tablet Commonly known as: Senokot-S Take 2 tablets by mouth 2 (two) times daily.   sucralfate 1 g tablet Commonly known as: CARAFATE Take 1 tablet (1 g total) by mouth 4 (four) times daily -  with meals and at bedtime.       Follow-up Information    [name], MD Follow up in 1 week(s).   Specialty: Family Medicine Contact information: [place] [address] [phone]        Jonathon Bellows, MD Follow up in 1 week(s).   Specialty: Gastroenterology Contact information: [place] [address] [phone]               Signed: Sharen Hones 09/06/2020, 10:09 AM

## 2020-09-06 NOTE — Care Management Important Message (Signed)
Important Message  Patient Details  Name: Bryan Scott MRN: [id number] Date of Birth: 19-Jun-1945   Medicare Important Message Given:  No  Patient discharged prior to arrival to unit to deliver concurrent Medicare IM.  Initial Medicare IM signed 09/03/2020 at 1:56pm.   Dannette Barbara 09/06/2020, 2:05 PM

## 2020-09-06 NOTE — Progress Notes (Signed)
Progress Note  Patient Name: Bryan Scott Date of Encounter: 09/06/2020  [facility] Cardiologist: [facility]  Subjective   No further nasuea or vomitining, still has some abdominal pain. No chest pain or SOB.   Inpatient Medications    Scheduled Meds: . atorvastatin  80 mg Oral Daily  . carvedilol  3.125 mg Oral BID WC  . clopidogrel  75 mg Oral q morning  . ezetimibe  10 mg Oral Daily  . gabapentin  100 mg Oral TID  . insulin aspart  0-9 Units Subcutaneous Q4H  . isosorbide mononitrate  30 mg Oral Daily  . multivitamin with minerals  1 tablet Oral Daily  . pantoprazole  40 mg Oral BID  . rivaroxaban  20 mg Oral Q supper  . senna-docusate  2 tablet Oral BID  . sucralfate  1 g Oral TID WC & HS   Continuous Infusions: . sodium chloride 100 mL/hr at 09/06/20 0639   PRN Meds: nitroGLYCERIN, ondansetron **OR** ondansetron (ZOFRAN) IV, oxyCODONE-acetaminophen, traZODone   Vital Signs    Vitals:   09/05/20 1638 09/05/20 1923 09/06/20 0506 09/06/20 0832  BP: 135/75 124/63 113/74 (!) 149/82  Pulse: 71 66 (!) 53 72  Resp:  19 18 19   Temp: 98.1 F (36.7 C) 98.1 F (36.7 C) 97.7 F (36.5 C) 97.8 F (36.6 C)  TempSrc: Oral  Oral   SpO2: 97% 95% 96% 97%  Weight:      Height:        Intake/Output Summary (Last 24 hours) at 09/06/2020 0910 Last data filed at 09/06/2020 4166 Gross per 24 hour  Intake 3135.11 ml  Output 1200 ml  Net 1935.11 ml   Last 3 Weights 09/05/2020 09/04/2020 09/03/2020  Weight (lbs) 180 lb 8 oz 175 lb 3.2 oz 179 lb  Weight (kg) 81.874 kg 79.47 kg 81.194 kg      Telemetry    Sr, HR 50-60s - Personally Reviewed  ECG    No new tracing - Personally Reviewed  Physical Exam   GEN: No acute distress.   Neck: No JVD Cardiac: RRR, no murmurs, rubs, or gallops.  Respiratory: Clear to auscultation bilaterally. GI: Soft, nontender, non-distended  MS: No edema; No deformity. Neuro:  Nonfocal  Psych: Normal affect   Labs    High  Sensitivity Troponin:   Recent Labs  Lab 08/07/20 1538 08/07/20 1725 09/03/20 0921 09/03/20 1019  TROPONINIHS 9 9 5 7       Chemistry Recent Labs  Lab 09/03/20 0921 09/04/20 0431 09/05/20 0855 09/06/20 0557  NA 129* 134* 134* 135  K 3.6 3.2* 3.8 3.4*  CL 95* 102 103 104  CO2 20* 19* 20* 21*  GLUCOSE 159* 83 100* 96  BUN 19 22 19 14   CREATININE 1.27* 1.19 1.05 1.00  CALCIUM 9.9 8.9 8.8* 8.8*  PROT 8.5* 6.5 6.6  --   ALBUMIN 4.5 3.6 3.6  --   AST 25 18 18   --   ALT 25 19 18   --   ALKPHOS 86 64 68  --   BILITOT 2.6*  3.0* 3.2* 2.9*  --   GFRNONAA 59* >60 >60 >60  ANIONGAP 14 13 11 10      Hematology Recent Labs  Lab 09/03/20 0921 09/04/20 0431 09/05/20 0855 09/06/20 0557  WBC 9.3 9.7  --  8.3  RBC 5.77 4.98 4.73 4.40  HGB 19.1* 16.4  --  14.6  HCT 54.2* 46.7  --  41.8  MCV 93.9 93.8  --  95.0  MCH 33.1 32.9  --  33.2  MCHC 35.2 35.1  --  34.9  RDW 12.6 12.7  --  12.8  PLT 224 204  --  161    BNPNo results for input(s): BNP, PROBNP in the last 168 hours.   DDimer No results for input(s): DDIMER in the last 168 hours.   Radiology    MR ABDOMEN MRCP WO CONTRAST  Result Date: 09/04/2020 CLINICAL DATA:  CT demonstrating mild biliary duct dilatation. Abdominal pain. Elevated bilirubin. EXAM: MRI ABDOMEN WITHOUT CONTRAST  (INCLUDING MRCP) TECHNIQUE: Multiplanar multisequence MR imaging of the abdomen was performed. Heavily T2-weighted images of the biliary and pancreatic ducts were obtained, and three-dimensional MRCP images were rendered by post processing. COMPARISON:  CT of the abdomen of 1 day prior. FINDINGS: Lower chest: Normal heart size without pericardial or pleural effusion. Hepatobiliary: Normal noncontrast appearance of the liver. No gallstones or acute pericholecystic fluid. The intrahepatic ducts are upper normal, similar. The common duct measures maximally 8 mm in the porta hepatis on 16/3 versus 9 mm on the prior exam (when remeasured). Tapers in the  region of the pancreatic head, without obstructive stone or mass. Pancreas: Borderline prominence of the pancreatic duct is similar over multiple prior exams. No acute inflammation. Spleen:  Normal in size, without focal abnormality. Adrenals/Urinary Tract: Normal adrenal glands. Normal noncontrast appearance of the kidneys for age, without hydronephrosis. Stomach/Bowel: Normal stomach and abdominal bowel loops. Vascular/Lymphatic: Normal aortic caliber. No abdominal adenopathy. Other:  No ascites. Musculoskeletal: S shaped thoracolumbar spine curvature. IMPRESSION: 1. Minimal improvement in common duct caliber, currently upper normal for age. No obstructive stone or mass. No explanation for elevated bilirubin. 2.  No acute abdominal process. Electronically Signed   By: Abigail Miyamoto M.D.   On: 09/04/2020 15:47   ECHOCARDIOGRAM COMPLETE  Result Date: 09/04/2020    ECHOCARDIOGRAM REPORT   Patient Name:   Bryan Scott Date of Exam: 09/04/2020 Medical Rec #:  [id number]    Height:       72.0 in Accession #:    [id number]   Weight:       175.2 lb Date of Birth:  December 04, 1945     BSA:          2.014 m Patient Age:    75 years     BP:           121/78 mmHg Patient Gender: M            HR:           69 bpm. Exam Location:  [facility] Procedure: 2D Echo, Color Doppler, Cardiac Doppler and Intracardiac            Opacification Agent Indications:     I25.10 CAD Native Vessel  History:         Patient has prior history of Echocardiogram examinations. ICM,                  Prior CABG; Risk Factors:Former Smoker, Hypertension, Diabetes                  and Dyslipidemia.  Sonographer:     Charmayne Sheer RDCS (AE) Referring Phys:  [id number] CHRISTOPHER END Diagnosing Phys: Nelva Bush MD  Sonographer Comments: Technically difficult study due to poor echo windows. IMPRESSIONS  1. Left ventricular ejection fraction, by estimation, is 70 to 75%. The left ventricle has hyperdynamic function. Left ventricular endocardial border not optimally  defined to evaluate regional wall  motion. Left ventricular diastolic parameters are consistent with Grade I diastolic dysfunction (impaired relaxation).  2. Right ventricular systolic function is normal. The right ventricular size is normal.  3. The mitral valve is grossly normal. Trivial mitral valve regurgitation. No evidence of mitral stenosis.  4. The aortic valve is tricuspid. There is mild calcification of the aortic valve. There is mild thickening of the aortic valve. Aortic valve regurgitation is not visualized. Mild to moderate aortic valve sclerosis/calcification is present, without any evidence of aortic stenosis.  5. The inferior vena cava is normal in size with greater than 50% respiratory variability, suggesting right atrial pressure of 3 mmHg. FINDINGS  Left Ventricle: Left ventricular ejection fraction, by estimation, is 70 to 75%. The left ventricle has hyperdynamic function. Left ventricular endocardial border not optimally defined to evaluate regional wall motion. Definity contrast agent was given IV to delineate the left ventricular endocardial borders. The left ventricular internal cavity size was normal in size. There is no left ventricular hypertrophy. Left ventricular diastolic parameters are consistent with Grade I diastolic dysfunction (impaired relaxation). Right Ventricle: The right ventricular size is normal. No increase in right ventricular wall thickness. Right ventricular systolic function is normal. Left Atrium: Left atrial size was normal in size. Right Atrium: Right atrial size was normal in size. Pericardium: There is no evidence of pericardial effusion. Mitral Valve: The mitral valve is grossly normal. Trivial mitral valve regurgitation. No evidence of mitral valve stenosis. MV peak gradient, 3.7 mmHg. The mean mitral valve gradient is 2.0 mmHg. Tricuspid Valve: The tricuspid valve is not well visualized. Tricuspid valve regurgitation is trivial. Aortic Valve: The aortic valve is  tricuspid. There is mild calcification of the aortic valve. There is mild thickening of the aortic valve. Aortic valve regurgitation is not visualized. Mild to moderate aortic valve sclerosis/calcification is present, without any evidence of aortic stenosis. Aortic valve mean gradient measures 4.0 mmHg. Aortic valve peak gradient measures 8.6 mmHg. Aortic valve area, by VTI measures 2.16 cm. Pulmonic Valve: The pulmonic valve was grossly normal. Pulmonic valve regurgitation is not visualized. No evidence of pulmonic stenosis. Aorta: The aortic root is normal in size and structure. Pulmonary Artery: The pulmonary artery is of normal size. Venous: The inferior vena cava is normal in size with greater than 50% respiratory variability, suggesting right atrial pressure of 3 mmHg. IAS/Shunts: The interatrial septum was not well visualized.  LEFT VENTRICLE PLAX 2D LVIDd:         4.00 cm  Diastology LVIDs:         3.00 cm  LV e' lateral:   7.51 cm/s LV PW:         0.80 cm  LV E/e' lateral: 8.4 LV IVS:        0.80 cm LVOT diam:     2.00 cm LV SV:         52 LV SV Index:   26 LVOT Area:     3.14 cm  RIGHT VENTRICLE RV Basal diam:  3.72 cm LEFT ATRIUM         Index      RIGHT ATRIUM           Index LA diam:    2.60 cm 1.29 cm/m RA Area:     10.75 cm 5.34 cm/m  AORTIC VALVE                   PULMONIC VALVE AV Area (Vmax):    2.22 cm  PV Vmax:       1.37 m/s AV Area (Vmean):   2.17 cm    PV Vmean:      85.800 cm/s AV Area (VTI):     2.16 cm    PV VTI:        0.208 m AV Vmax:           147.00 cm/s PV Peak grad:  7.5 mmHg AV Vmean:          97.000 cm/s PV Mean grad:  3.0 mmHg AV VTI:            0.241 m AV Peak Grad:      8.6 mmHg AV Mean Grad:      4.0 mmHg LVOT Vmax:         104.00 cm/s LVOT Vmean:        67.000 cm/s LVOT VTI:          0.166 m LVOT/AV VTI ratio: 0.69  AORTA Ao Root diam: 3.30 cm MITRAL VALVE MV Area (PHT): 6.96 cm    SHUNTS MV Area VTI:   2.94 cm    Systemic VTI:  0.17 m MV Peak grad:  3.7 mmHg     Systemic Diam: 2.00 cm MV Mean grad:  2.0 mmHg MV Vmax:       0.96 m/s MV Vmean:      63.9 cm/s MV Decel Time: 109 msec MV E velocity: 63.40 cm/s MV A velocity: 99.00 cm/s MV E/A ratio:  0.64 Harrell Gave End MD Electronically signed by Nelva Bush MD Signature Date/Time: 09/04/2020/5:11:00 PM    Final     Cardiac Studies   Echo 09/04/2020 1. Left ventricular ejection fraction, by estimation, is 70 to 75%. The  left ventricle has hyperdynamic function. Left ventricular endocardial  border not optimally defined to evaluate regional wall motion. Left  ventricular diastolic parameters are  consistent with Grade I diastolic dysfunction (impaired relaxation).  2. Right ventricular systolic function is normal. The right ventricular  size is normal.  3. The mitral valve is grossly normal. Trivial mitral valve  regurgitation. No evidence of mitral stenosis.  4. The aortic valve is tricuspid. There is mild calcification of the  aortic valve. There is mild thickening of the aortic valve. Aortic valve  regurgitation is not visualized. Mild to moderate aortic valve  sclerosis/calcification is present, without any  evidence of aortic stenosis.  5. The inferior vena cava is normal in size with greater than 50%  respiratory variability, suggesting right atrial pressure of 3 mmHg.   LHC 07/13/2020 Conclusions: 1. Multivessel coronary artery and bypass graft disease, as detailed in Dr. Donivan Scull diagnostic catheterization report. Culprit lesion appears to be 95% stenosis in SVG-RCA. 2. Successful PCI to SVG-RCA using Resolute Onyx 3.5 x 18 mm drug-eluting stent with 0% residual stenosis and TIMI-3 flow.  Recommendations: 1. Remove right femoral artery sheath 2 hours after discontinuation of bivalirudin. 2. Restart heparin 8 hours after sheath removal. Transition to rivaroxaban tomorrow if no evidence of bleeding/vascular complication. 3. Continue clopidogrel for 12 months (defer aspirin in the  setting of aspirin allergy on chronic anticoagulation with rivaroxaban). 4. Aggressive secondary prevention   Patient Profile     75 y.o. male with history of CAD/CABG x 3, PAF on Xarelto who presented with nuasea, vomiting, syncope  Assessment & Plan    CAD s/p CABG and recent stenting to SVG-RCA 07/14/20 - Troponins negative - No anginal symptoms - Echo this admission showed preserved EF. LHC in  06/2020 as above - continue plavix, statin, Imdur, coreg, zetia - No ASA with Xarelto - No plan for further cardiac testing - patient plans to follow with cardiology at the [facility]  Paroxysmal Afib - CHADSVASC = 6 - continue Xarelto - In SR  GI symptoms - Improved-suspected due to dehydration - plan for outpatient EGD and colonoscopy - per IM/GI  For questions or updates, please contact [facility] Please consult [email or web address] for contact info under        Signed, Bardia Wangerin Ninfa Meeker, PA-C  09/06/2020, 9:10 AM

## 2020-09-06 NOTE — TOC Transition Note (Signed)
Transition of Care [facility]) - CM/SW Discharge Note   Patient Details  Name: Bryan Scott MRN: [id number] Date of Birth: [birth year]  Transition of Care [facility]) CM/SW Contact:  Kerin Salen, RN Phone Number: 09/06/2020, 12:46 PM   Clinical Narrative:  Patient to be discharged today, Attending to submit order to resume [place]. [place] notified of discharge. TOC barriers resolved.    Final next level of care: [place] Barriers to Discharge: Barriers Resolved   Patient Goals and CMS Choice Patient states their goals for this hospitalization and ongoing recovery are:: To return home.   Choice offered to / list presented to : NA  Discharge Placement                  Name of family member notified: Family to call sister. Patient and family notified of of transfer: 09/06/20  Discharge Plan and Services In-house Referral: Clinical Social Work Discharge Planning Services: NA Post Acute Care Choice: Home Health          DME Arranged: N/A DME Agency: NA       HH Arranged: PT,RN [place]: [facility] Date [place]: 09/06/20 Time [place]: 1000 Representative spoke with at [place]: [place]  Social Determinants of Health ([place]) Interventions     Readmission Risk Interventions No flowsheet data found.

## 2020-09-17 ENCOUNTER — Other Ambulatory Visit: Payer: Self-pay

## 2020-09-17 ENCOUNTER — Inpatient Hospital Stay (HOSPITAL_COMMUNITY): Payer: Medicare PPO

## 2020-09-17 ENCOUNTER — Emergency Department (HOSPITAL_COMMUNITY): Payer: Medicare PPO

## 2020-09-17 ENCOUNTER — Inpatient Hospital Stay (HOSPITAL_COMMUNITY)
Admission: EM | Admit: 2020-09-17 | Discharge: 2020-09-25 | DRG: 641 | Disposition: A | Discharge status: Skilled Nursing Facility | Payer: Medicare PPO | Attending: Family Medicine | Admitting: Family Medicine

## 2020-09-17 DIAGNOSIS — E440 Moderate protein-calorie malnutrition: Secondary | ICD-10-CM | POA: Insufficient documentation

## 2020-09-17 DIAGNOSIS — R1110 Vomiting, unspecified: Secondary | ICD-10-CM

## 2020-09-17 DIAGNOSIS — W19XXXA Unspecified fall, initial encounter: Secondary | ICD-10-CM

## 2020-09-17 DIAGNOSIS — E860 Dehydration: Secondary | ICD-10-CM

## 2020-09-17 DIAGNOSIS — T1490XA Injury, unspecified, initial encounter: Secondary | ICD-10-CM

## 2020-09-17 DIAGNOSIS — R9431 Abnormal electrocardiogram [ECG] [EKG]: Secondary | ICD-10-CM

## 2020-09-17 DIAGNOSIS — E1143 Type 2 diabetes mellitus with diabetic autonomic (poly)neuropathy: Secondary | ICD-10-CM | POA: Diagnosis present

## 2020-09-17 DIAGNOSIS — R55 Syncope and collapse: Principal | ICD-10-CM

## 2020-09-17 DIAGNOSIS — E46 Unspecified protein-calorie malnutrition: Secondary | ICD-10-CM | POA: Diagnosis present

## 2020-09-17 DIAGNOSIS — E876 Hypokalemia: Secondary | ICD-10-CM

## 2020-09-17 DIAGNOSIS — M4802 Spinal stenosis, cervical region: Secondary | ICD-10-CM | POA: Diagnosis present

## 2020-09-17 DIAGNOSIS — G894 Chronic pain syndrome: Secondary | ICD-10-CM | POA: Diagnosis present

## 2020-09-17 DIAGNOSIS — R112 Nausea with vomiting, unspecified: Secondary | ICD-10-CM | POA: Diagnosis not present

## 2020-09-17 DIAGNOSIS — I2510 Atherosclerotic heart disease of native coronary artery without angina pectoris: Secondary | ICD-10-CM | POA: Diagnosis present

## 2020-09-17 DIAGNOSIS — I25810 Atherosclerosis of coronary artery bypass graft(s) without angina pectoris: Secondary | ICD-10-CM | POA: Diagnosis not present

## 2020-09-17 DIAGNOSIS — Z79899 Other long term (current) drug therapy: Secondary | ICD-10-CM

## 2020-09-17 DIAGNOSIS — I252 Old myocardial infarction: Secondary | ICD-10-CM

## 2020-09-17 DIAGNOSIS — I480 Paroxysmal atrial fibrillation: Secondary | ICD-10-CM | POA: Diagnosis present

## 2020-09-17 DIAGNOSIS — K3184 Gastroparesis: Secondary | ICD-10-CM | POA: Diagnosis present

## 2020-09-17 DIAGNOSIS — Z7984 Long term (current) use of oral hypoglycemic drugs: Secondary | ICD-10-CM

## 2020-09-17 DIAGNOSIS — D751 Secondary polycythemia: Secondary | ICD-10-CM | POA: Diagnosis present

## 2020-09-17 DIAGNOSIS — R296 Repeated falls: Secondary | ICD-10-CM | POA: Diagnosis present

## 2020-09-17 DIAGNOSIS — R791 Abnormal coagulation profile: Secondary | ICD-10-CM | POA: Diagnosis present

## 2020-09-17 DIAGNOSIS — E785 Hyperlipidemia, unspecified: Secondary | ICD-10-CM | POA: Diagnosis present

## 2020-09-17 DIAGNOSIS — I110 Hypertensive heart disease with heart failure: Secondary | ICD-10-CM | POA: Diagnosis present

## 2020-09-17 DIAGNOSIS — N179 Acute kidney failure, unspecified: Secondary | ICD-10-CM | POA: Diagnosis present

## 2020-09-17 DIAGNOSIS — Z888 Allergy status to other drugs, medicaments and biological substances status: Secondary | ICD-10-CM

## 2020-09-17 DIAGNOSIS — Z6821 Body mass index (BMI) 21.0-21.9, adult: Secondary | ICD-10-CM

## 2020-09-17 DIAGNOSIS — S41112A Laceration without foreign body of left upper arm, initial encounter: Secondary | ICD-10-CM | POA: Diagnosis present

## 2020-09-17 DIAGNOSIS — R935 Abnormal findings on diagnostic imaging of other abdominal regions, including retroperitoneum: Secondary | ICD-10-CM | POA: Diagnosis not present

## 2020-09-17 DIAGNOSIS — Z951 Presence of aortocoronary bypass graft: Secondary | ICD-10-CM

## 2020-09-17 DIAGNOSIS — I440 Atrioventricular block, first degree: Secondary | ICD-10-CM | POA: Diagnosis present

## 2020-09-17 DIAGNOSIS — Z955 Presence of coronary angioplasty implant and graft: Secondary | ICD-10-CM

## 2020-09-17 DIAGNOSIS — Z87891 Personal history of nicotine dependence: Secondary | ICD-10-CM

## 2020-09-17 DIAGNOSIS — Z20822 Contact with and (suspected) exposure to covid-19: Secondary | ICD-10-CM | POA: Diagnosis present

## 2020-09-17 DIAGNOSIS — E871 Hypo-osmolality and hyponatremia: Secondary | ICD-10-CM | POA: Diagnosis present

## 2020-09-17 DIAGNOSIS — I5022 Chronic systolic (congestive) heart failure: Secondary | ICD-10-CM | POA: Diagnosis present

## 2020-09-17 DIAGNOSIS — I429 Cardiomyopathy, unspecified: Secondary | ICD-10-CM | POA: Diagnosis present

## 2020-09-17 DIAGNOSIS — Z7902 Long term (current) use of antithrombotics/antiplatelets: Secondary | ICD-10-CM

## 2020-09-17 DIAGNOSIS — Z8551 Personal history of malignant neoplasm of bladder: Secondary | ICD-10-CM

## 2020-09-17 DIAGNOSIS — W19XXXD Unspecified fall, subsequent encounter: Secondary | ICD-10-CM | POA: Diagnosis not present

## 2020-09-17 DIAGNOSIS — Z7901 Long term (current) use of anticoagulants: Secondary | ICD-10-CM

## 2020-09-17 DIAGNOSIS — R17 Unspecified jaundice: Secondary | ICD-10-CM | POA: Diagnosis present

## 2020-09-17 DIAGNOSIS — Z886 Allergy status to analgesic agent status: Secondary | ICD-10-CM

## 2020-09-17 DIAGNOSIS — R1084 Generalized abdominal pain: Secondary | ICD-10-CM | POA: Diagnosis not present

## 2020-09-17 HISTORY — DX: Other specified postprocedural states: Z98.890

## 2020-09-17 HISTORY — DX: Other chronic pain: G89.29

## 2020-09-17 HISTORY — DX: Malignant neoplasm of bladder, unspecified: C67.9

## 2020-09-17 HISTORY — DX: Unspecified injury of lower back, initial encounter: S39.92XA

## 2020-09-17 HISTORY — DX: Presence of aortocoronary bypass graft: Z95.1

## 2020-09-17 LAB — CBC WITH DIFFERENTIAL/PLATELET
Abs Immature Granulocytes: 0.07 10*3/uL (ref 0.00–0.07)
Basophils Absolute: 0.1 10*3/uL (ref 0.0–0.1)
Basophils Relative: 1 %
Eosinophils Absolute: 0.1 10*3/uL (ref 0.0–0.5)
Eosinophils Relative: 1 %
HCT: 55.8 % — ABNORMAL HIGH (ref 39.0–52.0)
Hemoglobin: 20.1 g/dL — ABNORMAL HIGH (ref 13.0–17.0)
Immature Granulocytes: 0 %
Lymphocytes Relative: 14 %
Lymphs Abs: 2.2 10*3/uL (ref 0.7–4.0)
MCH: 33.3 pg (ref 26.0–34.0)
MCHC: 36 g/dL (ref 30.0–36.0)
MCV: 92.4 fL (ref 80.0–100.0)
Monocytes Absolute: 0.9 10*3/uL (ref 0.1–1.0)
Monocytes Relative: 6 %
Neutro Abs: 12.5 10*3/uL — ABNORMAL HIGH (ref 1.7–7.7)
Neutrophils Relative %: 78 %
Platelets: 247 10*3/uL (ref 150–400)
RBC: 6.04 MIL/uL — ABNORMAL HIGH (ref 4.22–5.81)
RDW: 12.9 % (ref 11.5–15.5)
WBC: 15.9 10*3/uL — ABNORMAL HIGH (ref 4.0–10.5)
nRBC: 0 % (ref 0.0–0.2)

## 2020-09-17 LAB — BASIC METABOLIC PANEL
Anion gap: 14 (ref 5–15)
BUN: 16 mg/dL (ref 8–23)
CO2: 23 mmol/L (ref 22–32)
Calcium: 9.6 mg/dL (ref 8.9–10.3)
Chloride: 94 mmol/L — ABNORMAL LOW (ref 98–111)
Creatinine, Ser: 1.2 mg/dL (ref 0.61–1.24)
GFR, Estimated: >60 mL/min (ref >60)
Glucose, Bld: 162 mg/dL — ABNORMAL HIGH (ref 70–99)
Potassium: 3 mmol/L — ABNORMAL LOW (ref 3.5–5.1)
Sodium: 131 mmol/L — ABNORMAL LOW (ref 135–145)

## 2020-09-17 LAB — TROPONIN I (HIGH SENSITIVITY): Troponin I (High Sensitivity): 10 ng/L (ref <18)

## 2020-09-17 LAB — MAGNESIUM: Magnesium: 1.7 mg/dL (ref 1.7–2.4)

## 2020-09-17 LAB — PROTIME-INR
INR: 2.9 — ABNORMAL HIGH (ref 0.8–1.2)
INR: 3 — ABNORMAL HIGH (ref 0.8–1.2)
Prothrombin Time: 29 seconds — ABNORMAL HIGH (ref 11.4–15.2)
Prothrombin Time: 30 seconds — ABNORMAL HIGH (ref 11.4–15.2)

## 2020-09-17 LAB — TYPE AND SCREEN
ABO/RH(D): A POS
Antibody Screen: NEGATIVE

## 2020-09-17 LAB — ABO/RH: ABO/RH(D): A POS

## 2020-09-17 IMAGING — CT CT HEAD W/O CM
4 series · 17 of 47 positions shown, 19 images · non-contrast
Comparison: [DATE]

CLINICAL DATA: Syncope

EXAM:
CT HEAD WITHOUT CONTRAST
TECHNIQUE: Contiguous axial images were obtained from the base of the skull
through the vertex without intravenous contrast.

[Series 3: head wo · axial · 0.42mm/px · z∈[-145,-15]mm · 7 of 36 slices shown, 9 images]
[im 5/36  brain]
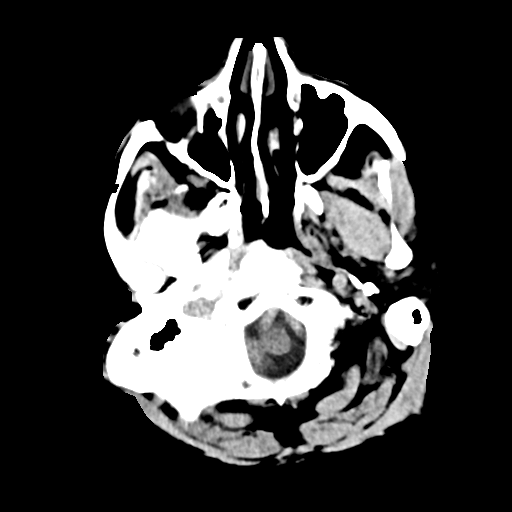
[im 5/36  bone]
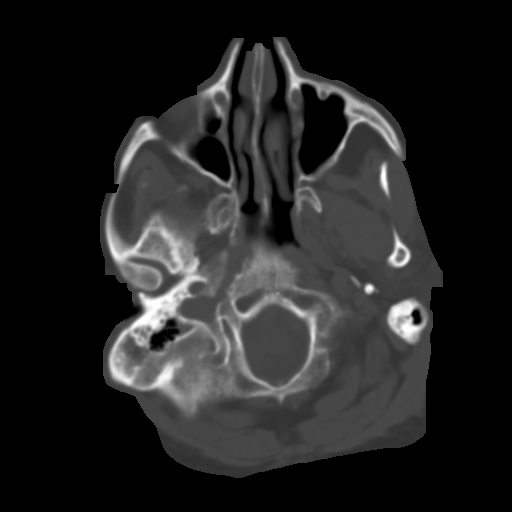
[im 9/36  brain]
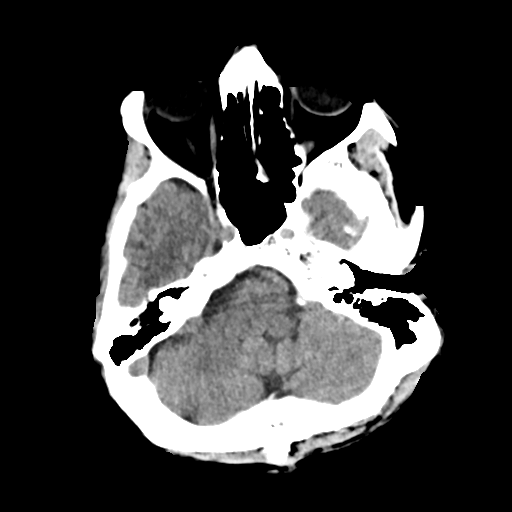
[im 14/36  brain]
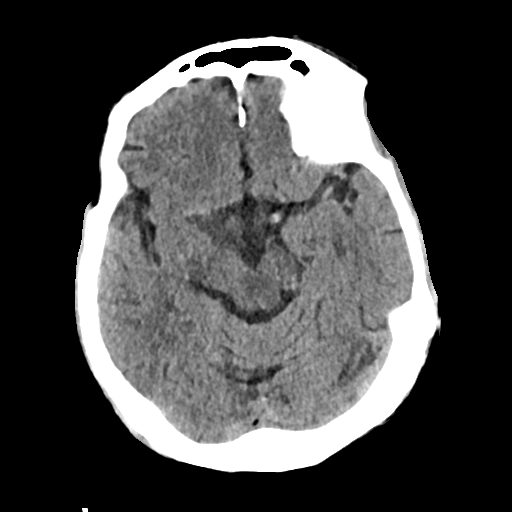
[im 18/36  brain]
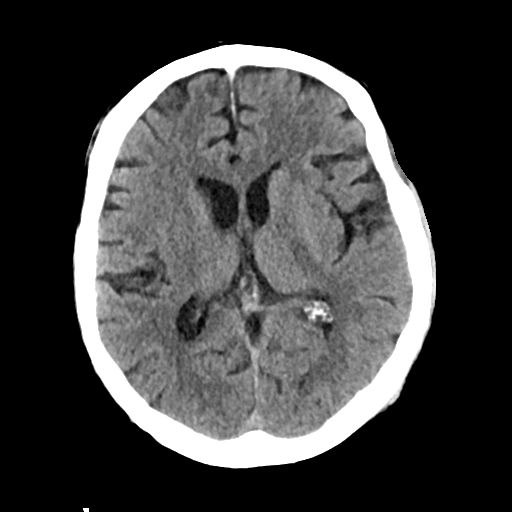
[im 22/36  brain]
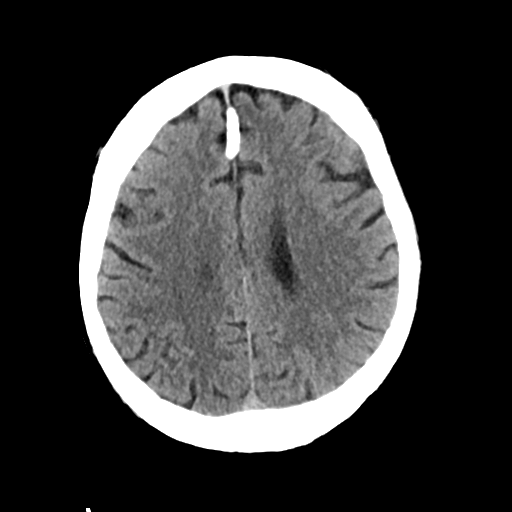
[im 22/36  bone]
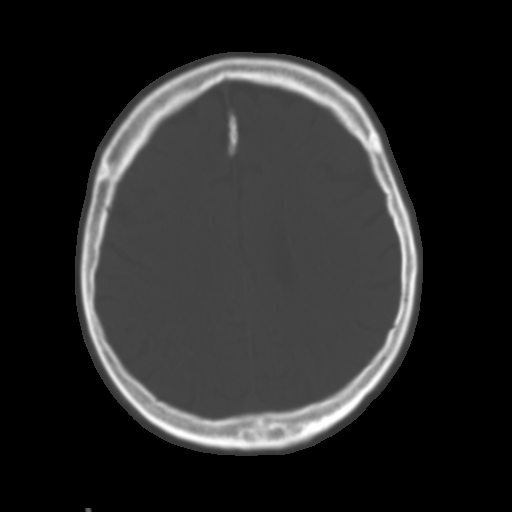
[im 27/36  brain]
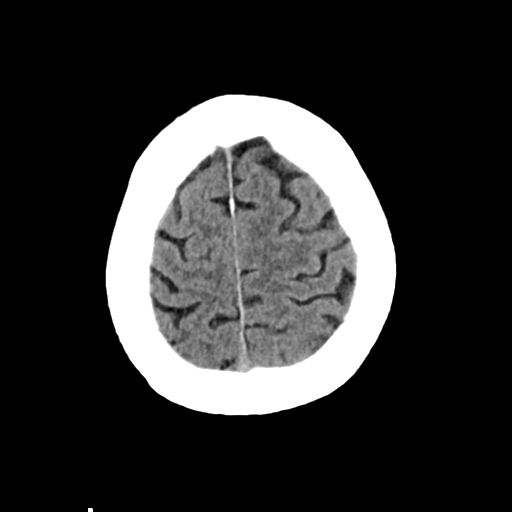
[im 31/36  brain]
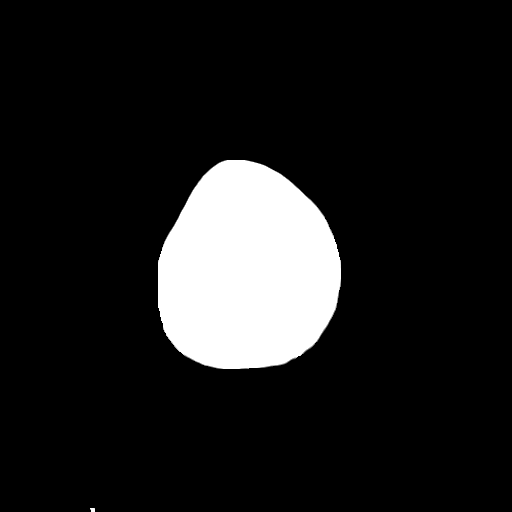

[Series 4: head bone · axial · 0.42mm/px · z∈[-149,-87]mm · 4 of 88 slices shown]
[im 9/88  bone]
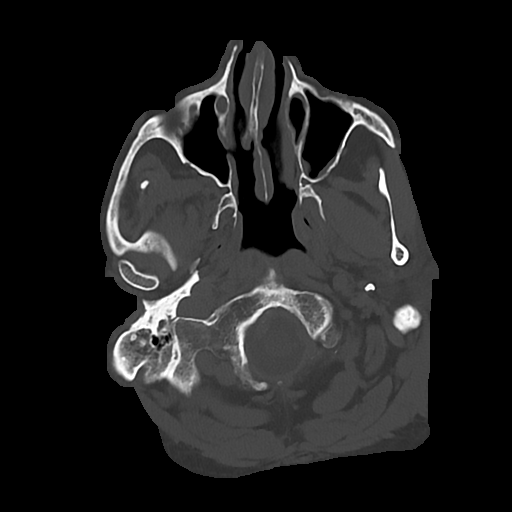
[im 18/88  bone]
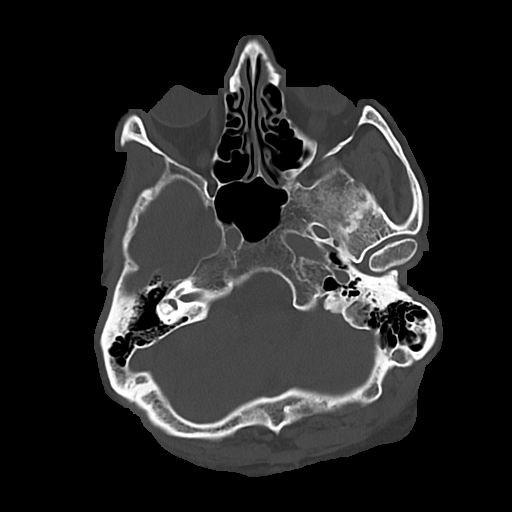
[im 27/88  bone]
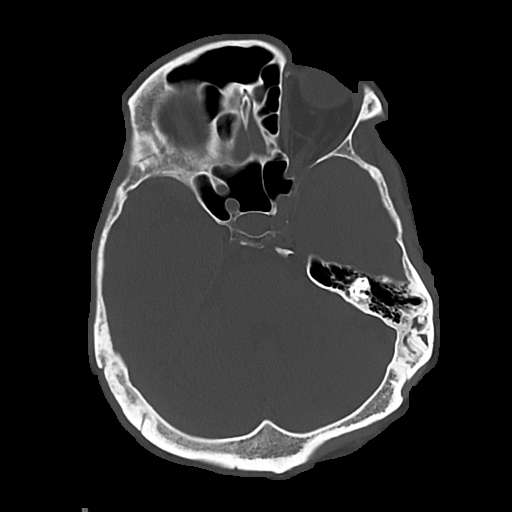
[im 40/88  bone]
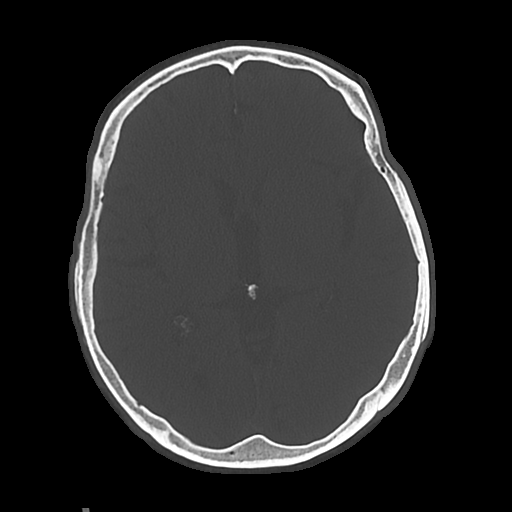

[Series 5: cor soft · coronal · 0.33mm/px · 3 of 73 slices shown]
[im 25/73  brain]
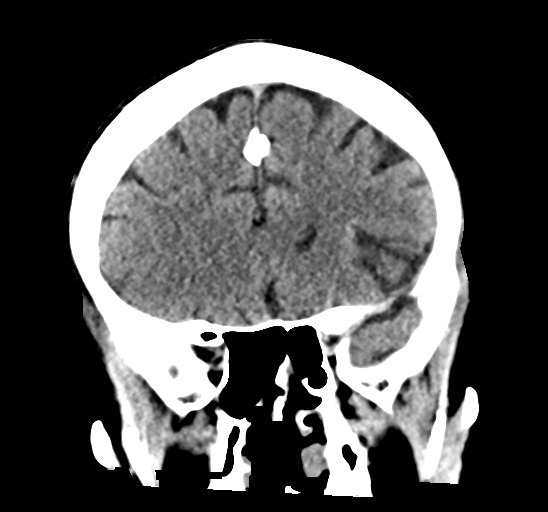
[im 33/73  brain]
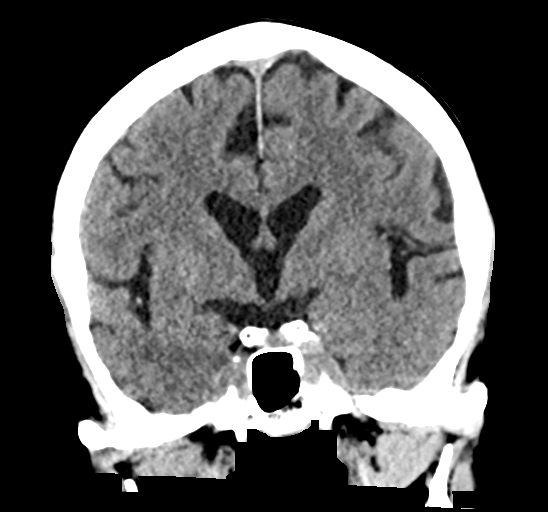
[im 41/73  brain]
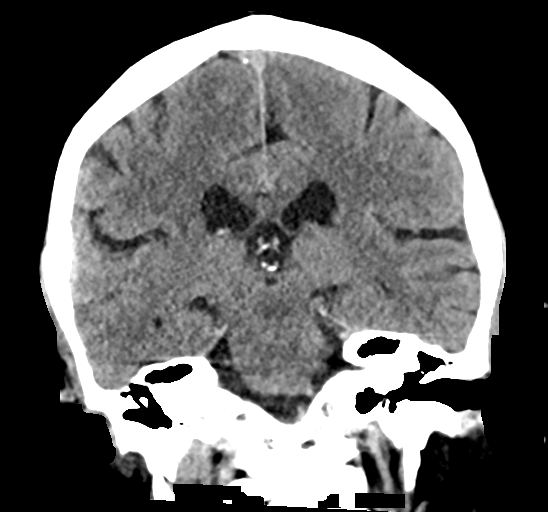

[Series 6: sag soft · sagittal · 0.34mm/px · 3 of 65 slices shown]
[im 25/65  brain]
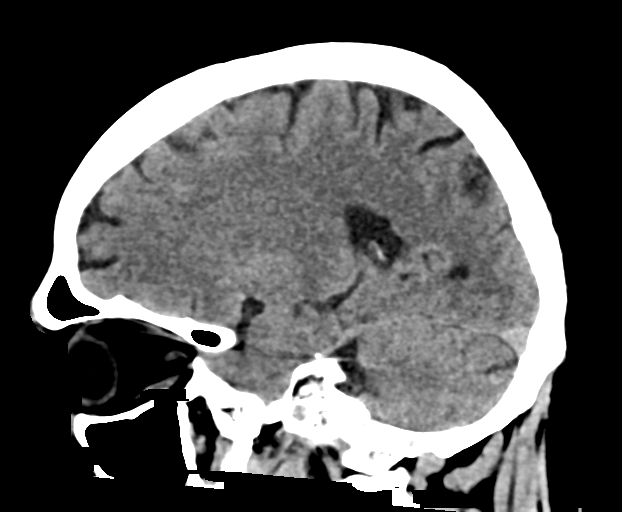
[im 33/65  brain]
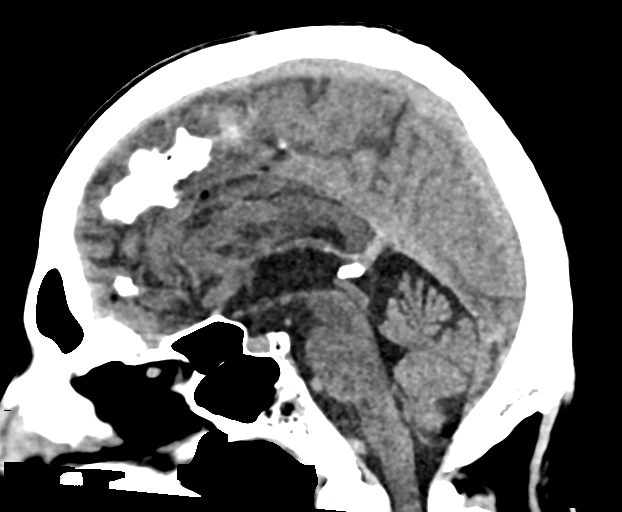
[im 40/65  brain]
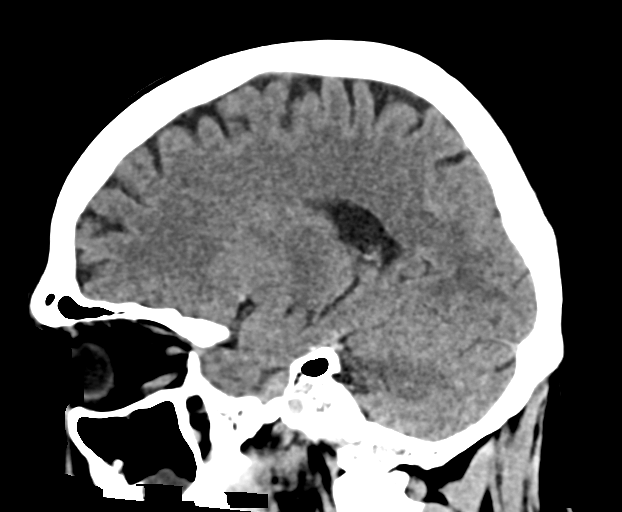

[17 of 47 positions shown; findings below may reference images not displayed]

FINDINGS: Brain: No acute intracranial abnormality. Specifically, no
hemorrhage, hydrocephalus, mass lesion, acute infarction, or
significant intracranial injury.

Vascular: No hyperdense vessel or unexpected calcification.

Skull: No acute calvarial abnormality.

Sinuses/Orbits: No acute findings

Other: None
IMPRESSION: No acute intracranial abnormality.

## 2020-09-17 IMAGING — DX DG HUMERUS 2V *R*
2 series · 2 of 2 positions shown · non-contrast
Comparison: None.

CLINICAL DATA: Fall, right shoulder pain

EXAM:
RIGHT HUMERUS - 2+ VIEW

[humerus ap]
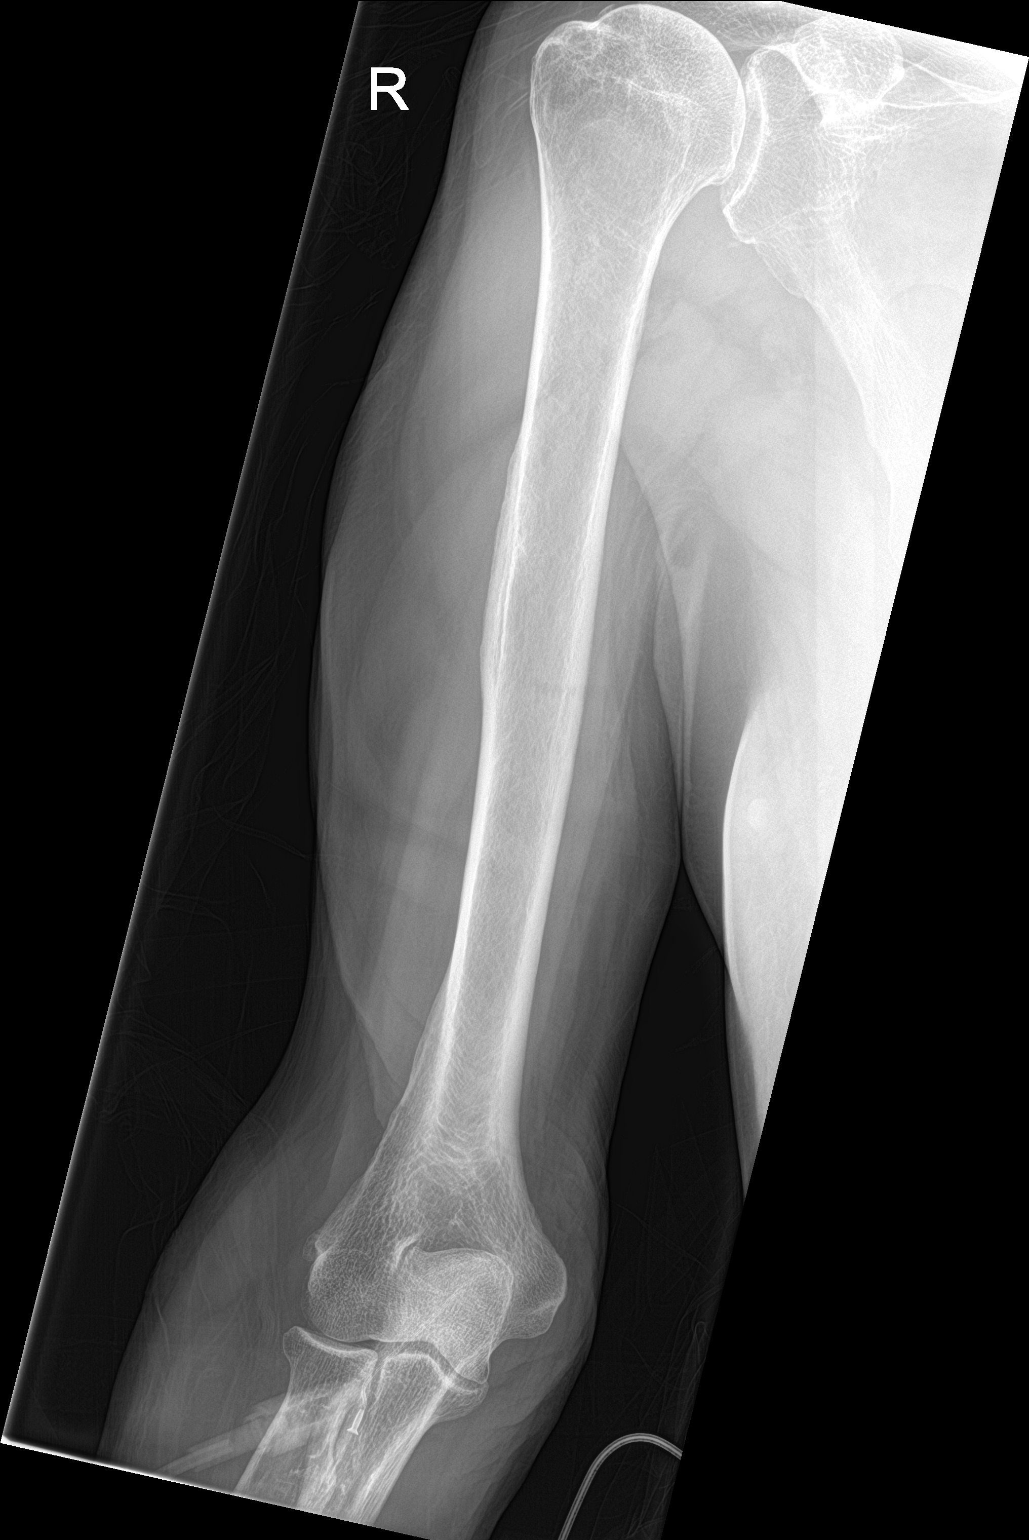

[humerus lat]
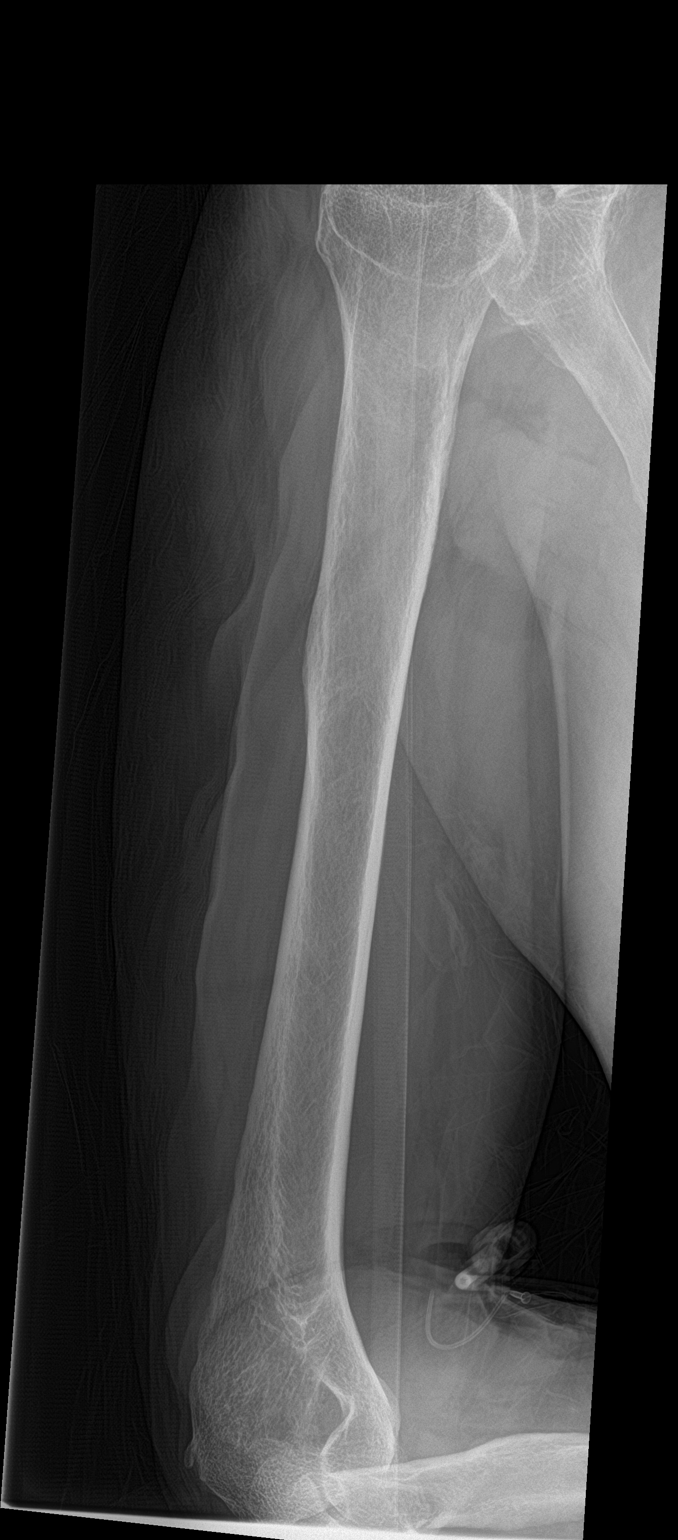

[2 of 2 positions shown; findings below may reference images not displayed]

FINDINGS: Moderate to advanced degenerative changes in the right AC joint with
joint space narrowing and spurring. Early degenerative changes in
the glenohumeral joint. No acute bony abnormality. Specifically, no
fracture, subluxation, or dislocation.
IMPRESSION: Degenerative changes in the right shoulder. No acute bony
abnormality.

## 2020-09-17 IMAGING — DX DG PELVIS 1-2V
1 series · 1 of 1 positions shown · non-contrast
Comparison: None.

CLINICAL DATA: Fall, low back pain radiating to right side.

EXAM:
PELVIS - 1-2 VIEW

[pelvis ap]
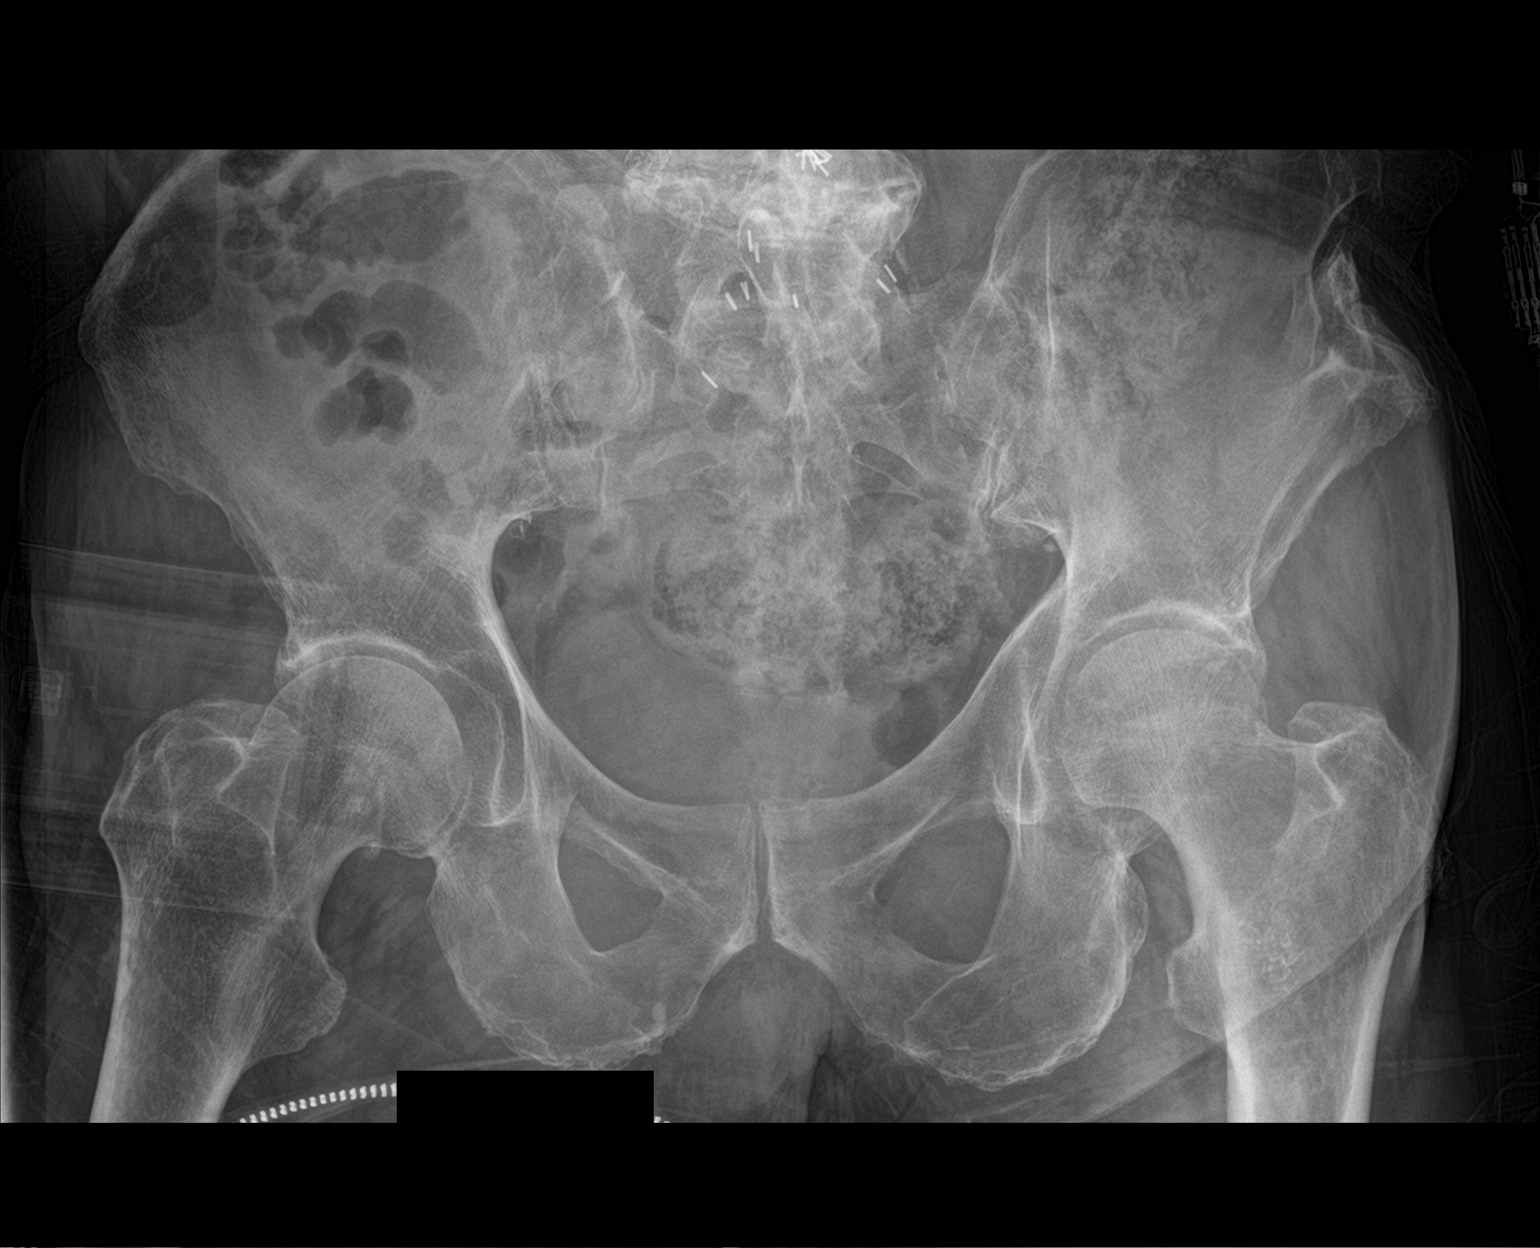

[1 of 1 positions shown; findings below may reference images not displayed]

FINDINGS: Symmetric degenerative changes in the hips. Degenerative changes in
the visualized lower lumbar spine. No acute bony abnormality.
Specifically, no fracture, subluxation, or dislocation.
IMPRESSION: No acute bony abnormality.

## 2020-09-17 IMAGING — DX DG CHEST 1V
1 series · 1 of 1 positions shown · non-contrast
Comparison: None.

CLINICAL DATA: Fall, right shoulder pain.

EXAM:
CHEST  1 VIEW

[chest ap]
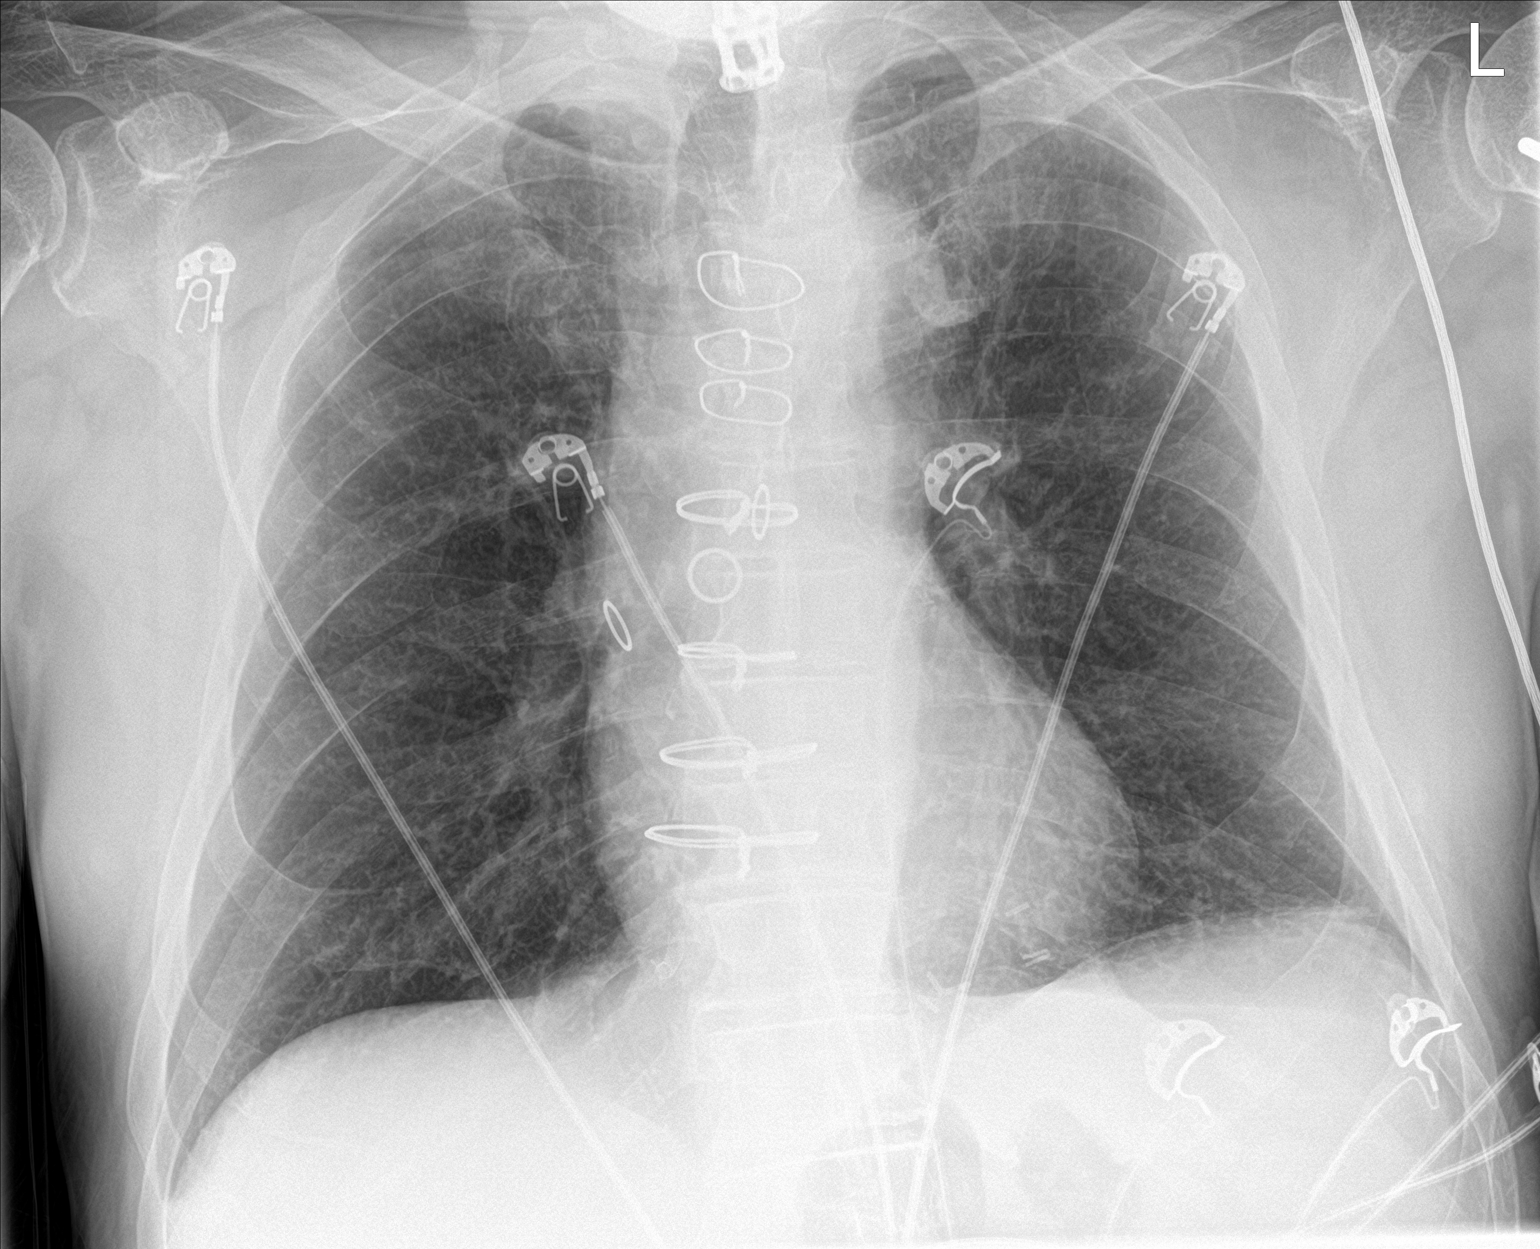

[1 of 1 positions shown; findings below may reference images not displayed]

FINDINGS: Prior CABG. Heart and mediastinal contours are within normal limits.
No focal opacities or effusions. No acute bony abnormality. No
visible displaced rib fracture or pneumothorax.
IMPRESSION: No active disease.

## 2020-09-17 IMAGING — DX DG SHOULDER 2+V*R*
3 series · 3 of 3 positions shown · non-contrast
Comparison: None.

CLINICAL DATA: Right shoulder pain

EXAM:
RIGHT SHOULDER - 2+ VIEW

[shoulder grashey]
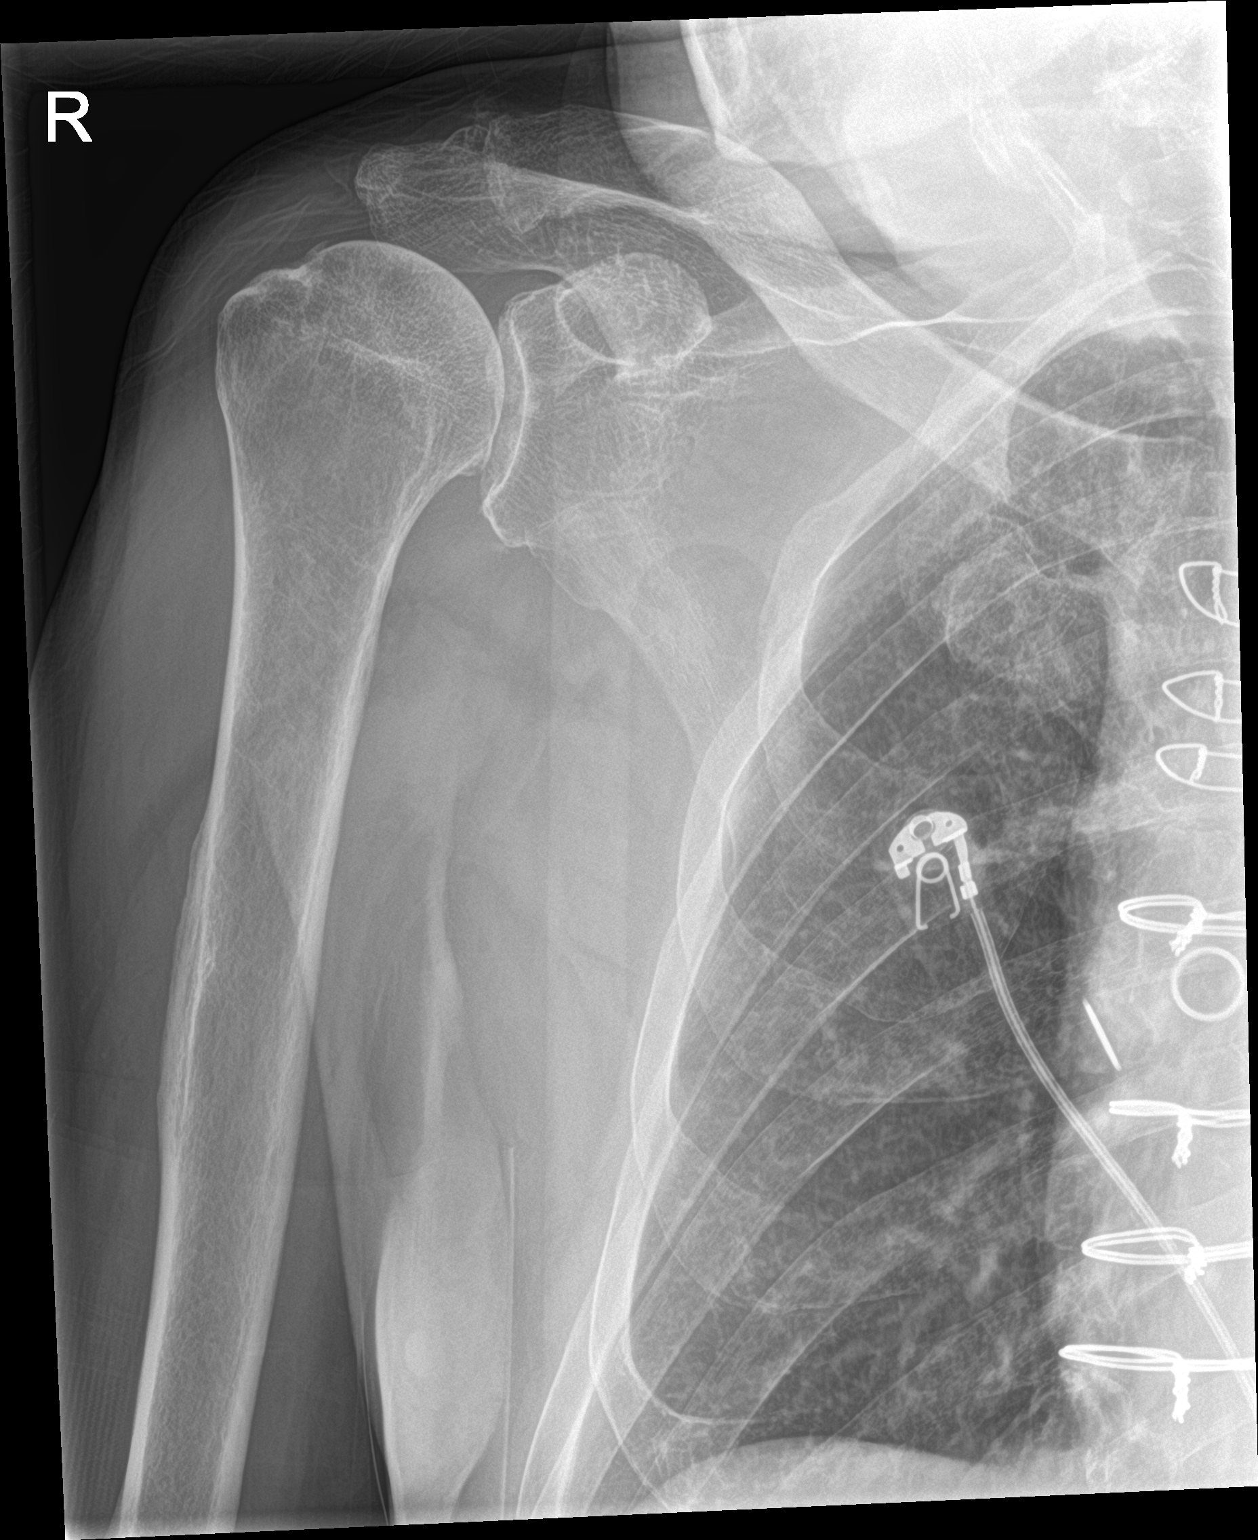

[shoulder y view]
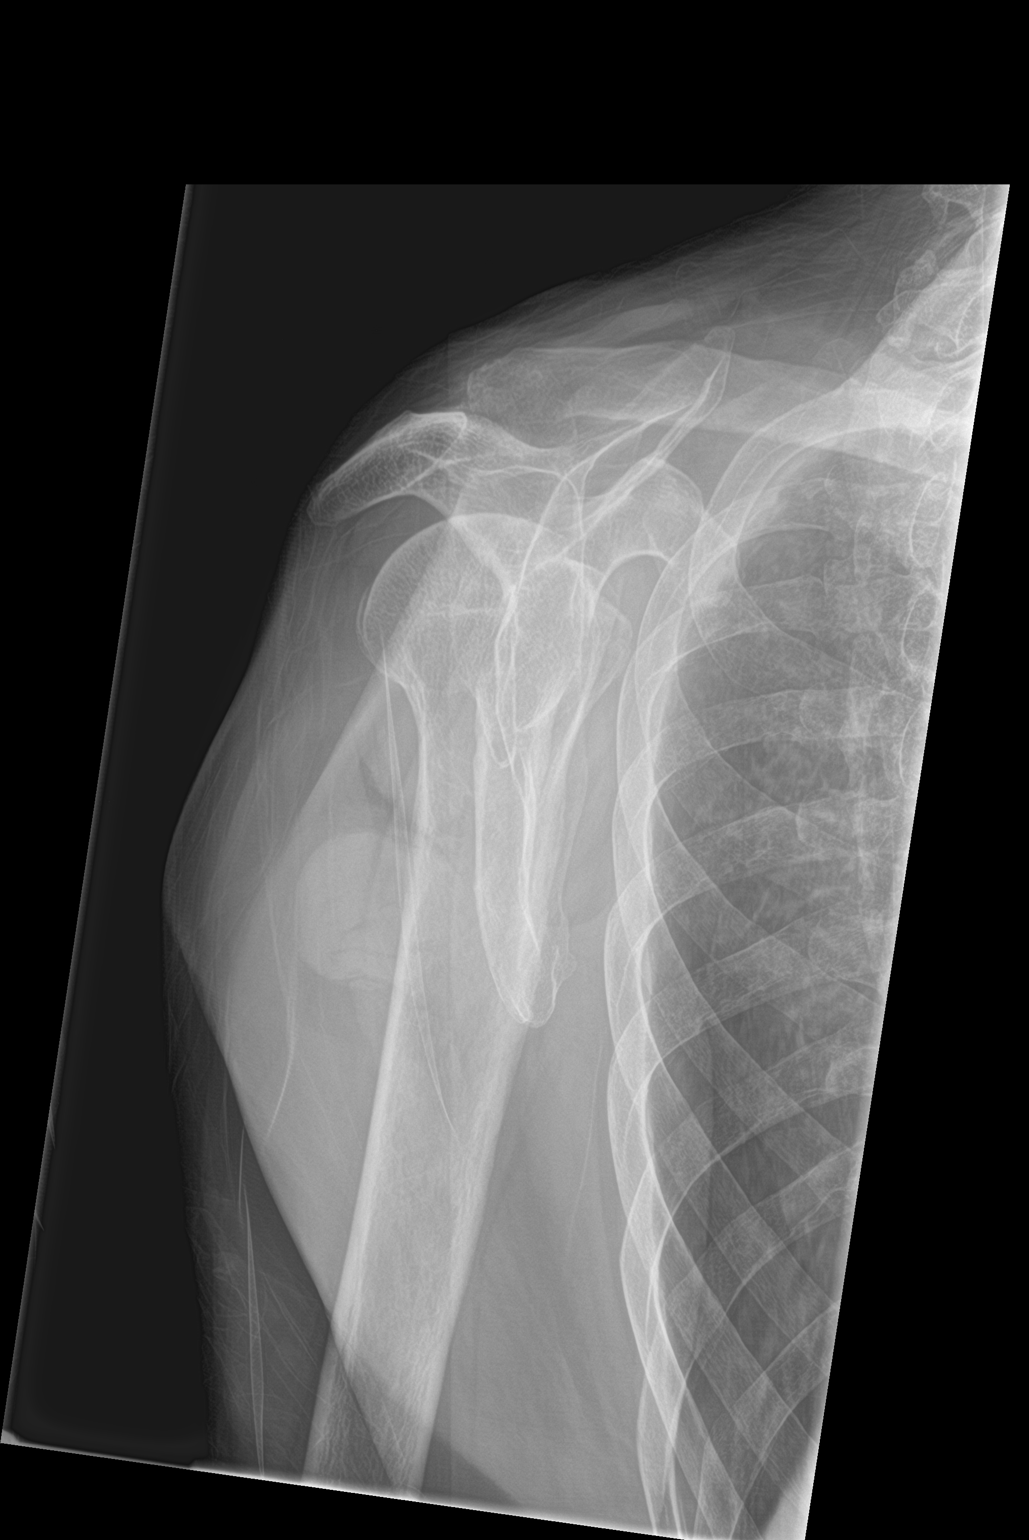

[shoulder ap neutral]
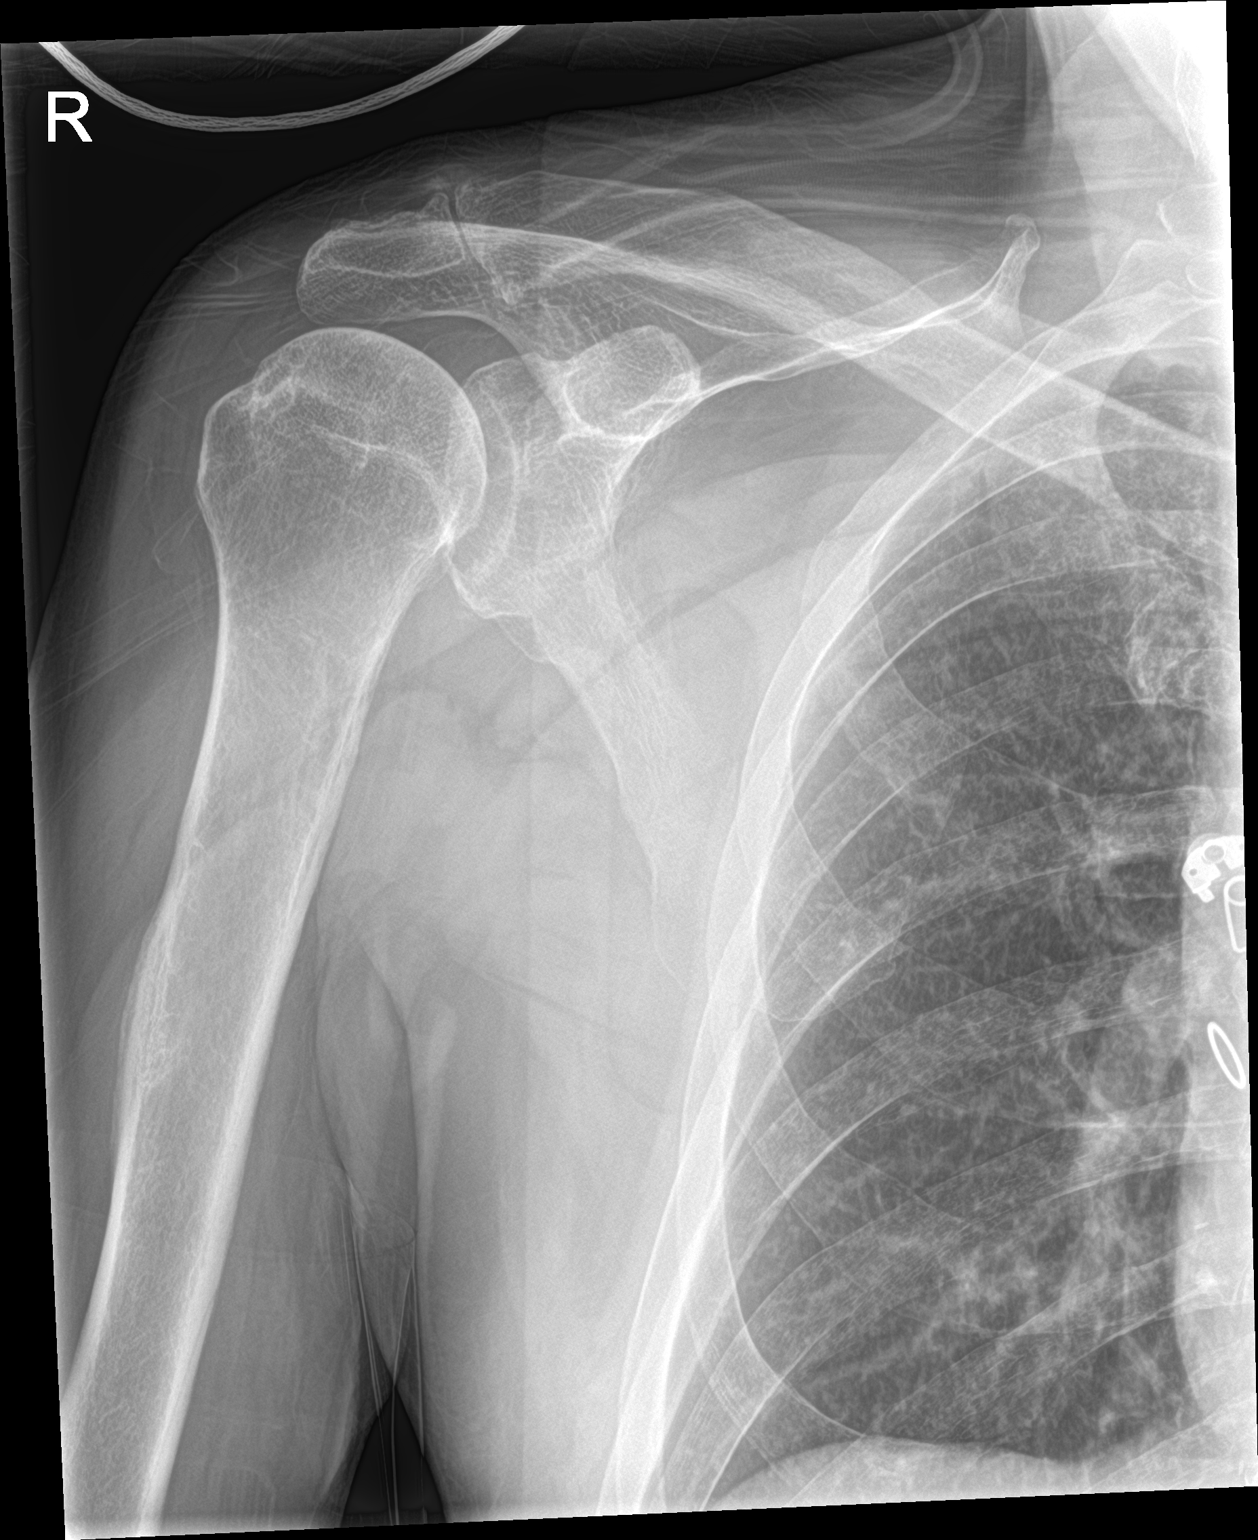

[3 of 3 positions shown; findings below may reference images not displayed]

FINDINGS: There is no evidence of fracture or dislocation. There is diffuse
osteopenia. Moderate AC and glenohumeral joint osteoarthritis is
seen with joint space loss. Mild overlying soft tissue swelling is
seen.
IMPRESSION: No acute osseous abnormality.

## 2020-09-17 IMAGING — DX DG LUMBAR SPINE 2-3V
3 series · 3 of 3 positions shown · non-contrast
Comparison: None.

CLINICAL DATA: Back pain, fall

EXAM:
LUMBAR SPINE - 2-3 VIEW

[l-spine lat]
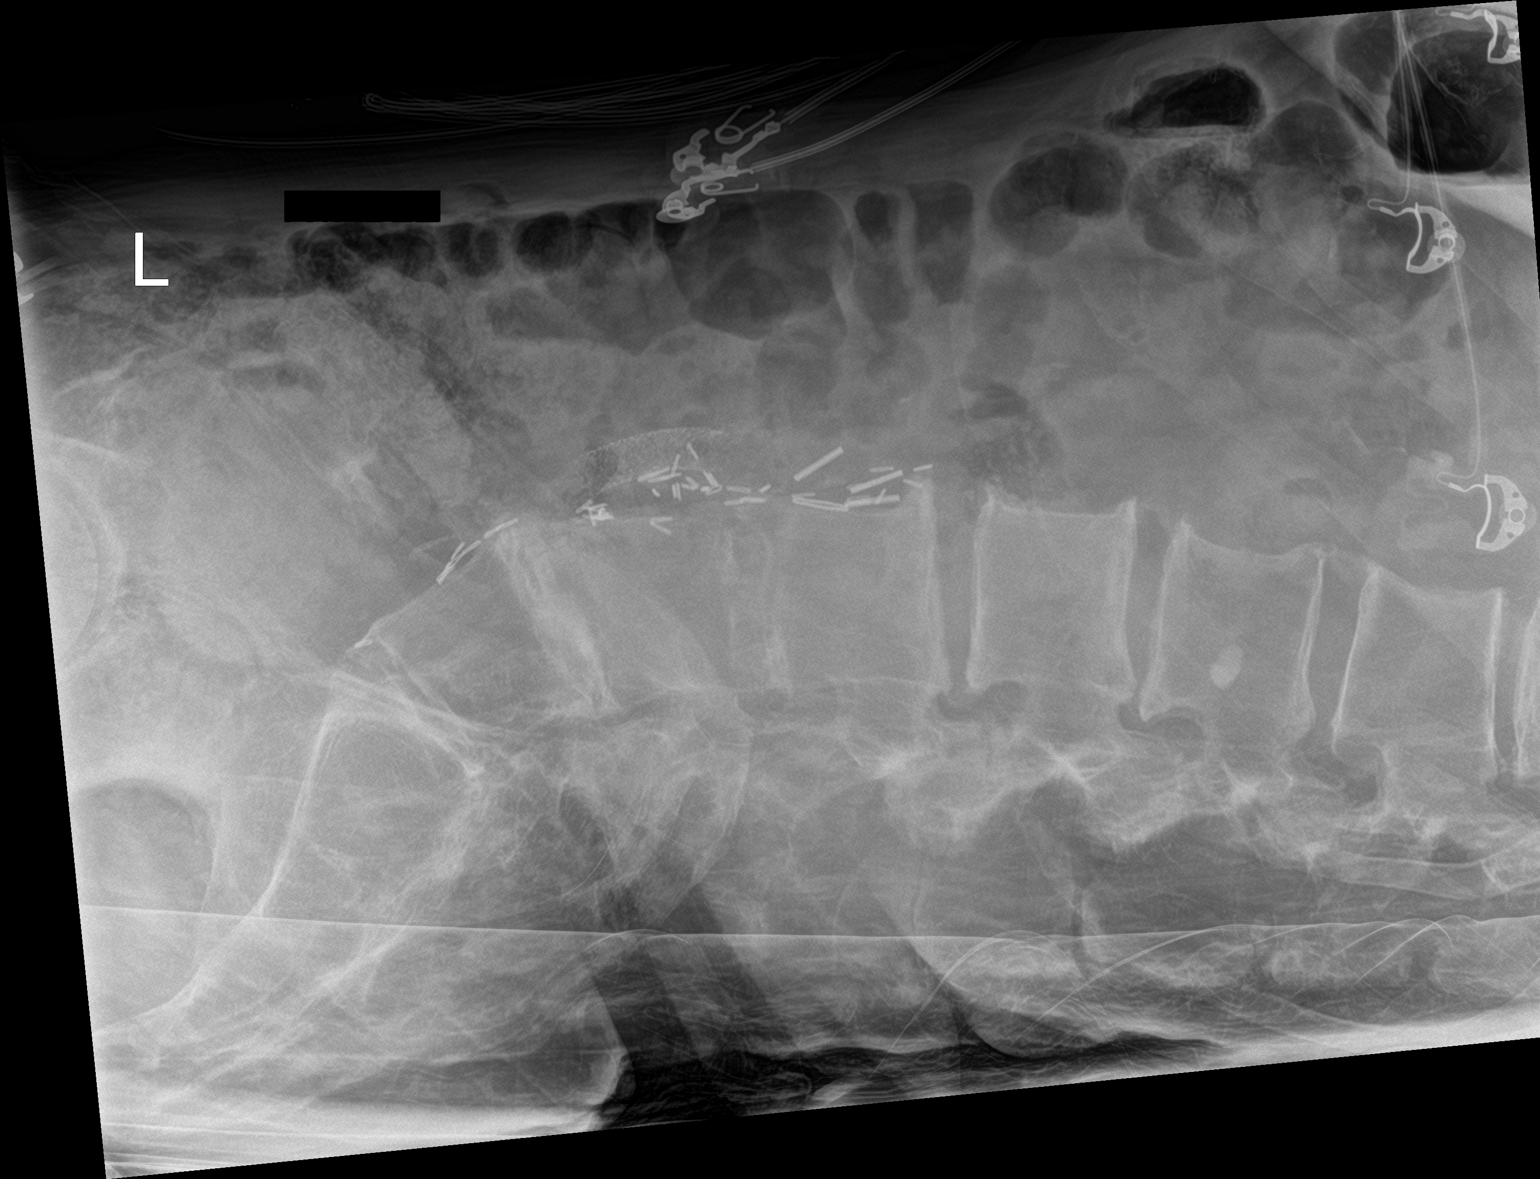

[l-spine spot]
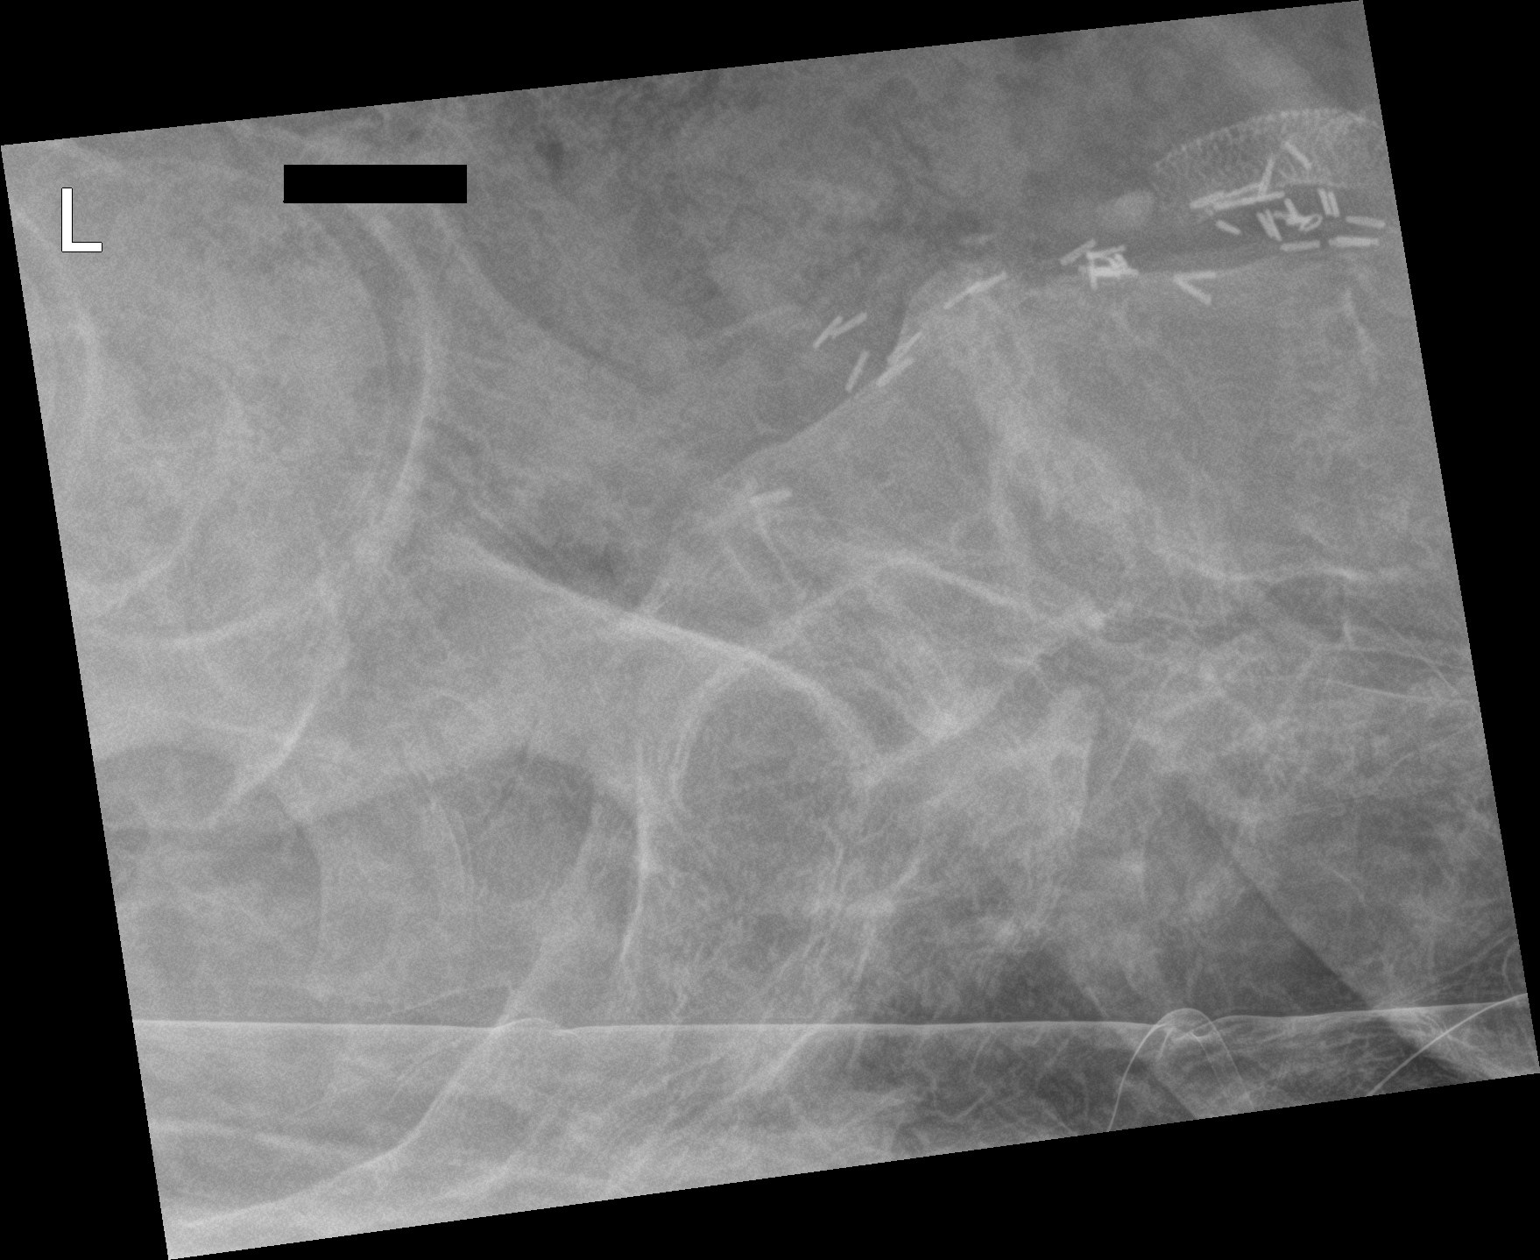

[l-spine ap]
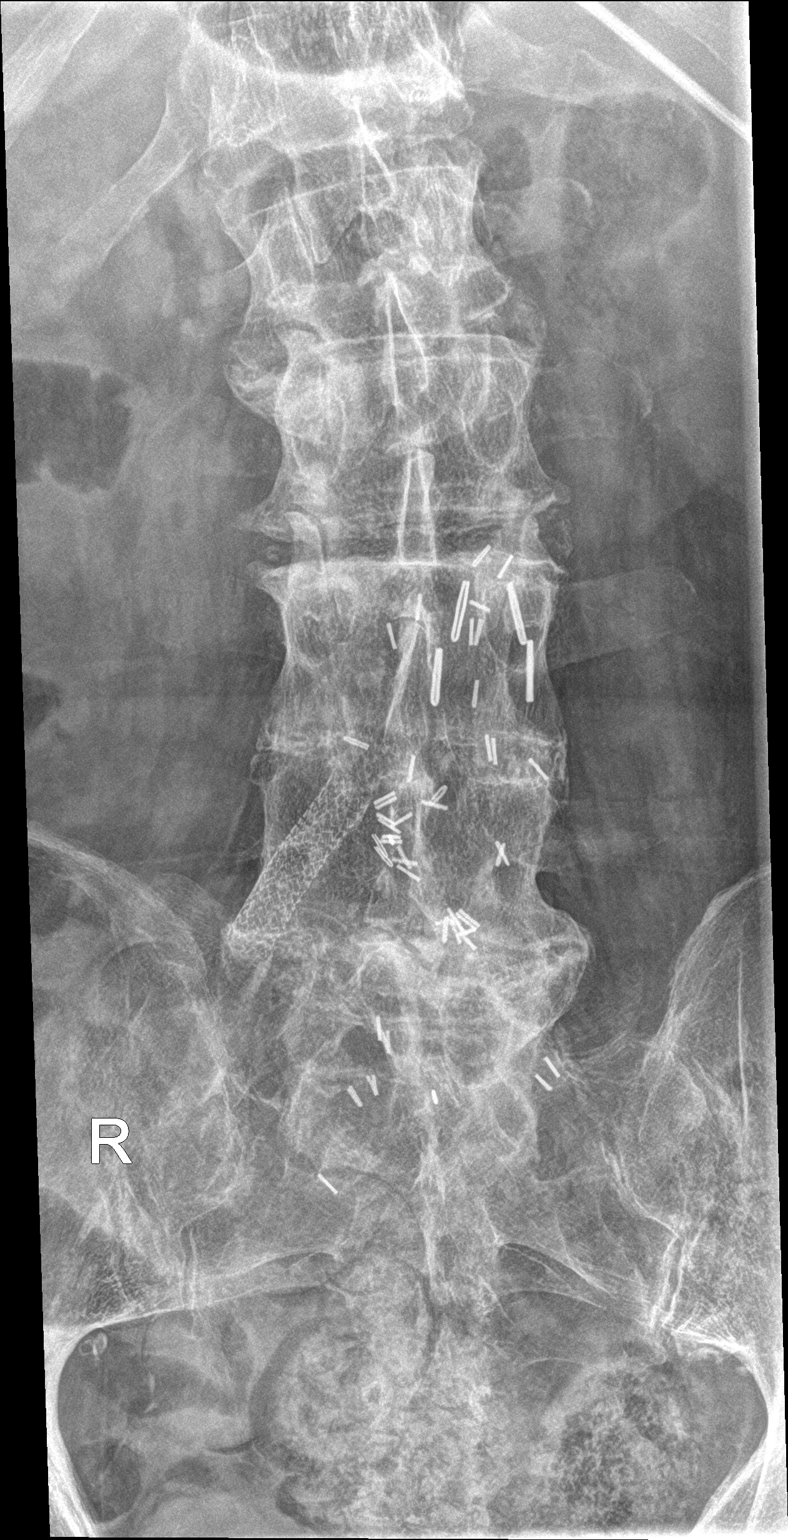

[3 of 3 positions shown; findings below may reference images not displayed]

FINDINGS: Partial fusion across the L3-4, L4-5 and L5-S1 disc spaces. Normal
alignment. Moderate to advanced degenerative disc and facet disease
diffusely. No fracture.
IMPRESSION: Diffuse degenerative disc and facet disease. No acute bony
abnormality.

## 2020-09-17 MED ORDER — SODIUM CHLORIDE 0.9 % IV SOLN: Freq: Once | INTRAVENOUS | Status: AC

## 2020-09-17 MED ORDER — EZETIMIBE 10 MG PO TABS
10.0000 mg | ORAL_TABLET | Freq: Every day | ORAL | Status: DC
  Administered 2020-09-18 – 2020-09-25 (×8): 10 mg via ORAL
  Filled 2020-09-17 (×8): qty 1

## 2020-09-17 MED ORDER — CARVEDILOL 3.125 MG PO TABS
3.1250 mg | ORAL_TABLET | Freq: Two times a day (BID) | ORAL | Status: DC
  Administered 2020-09-18 – 2020-09-25 (×15): 3.125 mg via ORAL
  Filled 2020-09-17 (×15): qty 1

## 2020-09-17 MED ORDER — PANTOPRAZOLE SODIUM 40 MG IV SOLR
40.0000 mg | Freq: Once | INTRAVENOUS | Status: AC
  Administered 2020-09-17: 40 mg via INTRAVENOUS
  Filled 2020-09-17: qty 40

## 2020-09-17 MED ORDER — OXYCODONE-ACETAMINOPHEN 5-325 MG PO TABS
2.0000 | ORAL_TABLET | Freq: Once | ORAL | Status: AC
Start: 2020-09-17 — End: 2020-09-17
  Administered 2020-09-17: 2 via ORAL
  Filled 2020-09-17: qty 2

## 2020-09-17 MED ORDER — ATORVASTATIN CALCIUM 80 MG PO TABS
80.0000 mg | ORAL_TABLET | Freq: Every day | ORAL | Status: DC
  Administered 2020-09-18 – 2020-09-25 (×8): 80 mg via ORAL
  Filled 2020-09-17 (×8): qty 1

## 2020-09-17 MED ORDER — GABAPENTIN 100 MG PO CAPS
100.0000 mg | ORAL_CAPSULE | Freq: Three times a day (TID) | ORAL | Status: DC
  Administered 2020-09-18 – 2020-09-25 (×23): 100 mg via ORAL
  Filled 2020-09-17 (×23): qty 1

## 2020-09-17 MED ORDER — ISOSORBIDE MONONITRATE ER 30 MG PO TB24
30.0000 mg | ORAL_TABLET | Freq: Every day | ORAL | Status: DC
  Administered 2020-09-18: 30 mg via ORAL
  Filled 2020-09-17: qty 1

## 2020-09-17 MED ORDER — SUCRALFATE 1 G PO TABS
1.0000 g | ORAL_TABLET | Freq: Four times a day (QID) | ORAL | Status: DC
  Administered 2020-09-18 – 2020-09-25 (×28): 1 g via ORAL
  Filled 2020-09-17 (×28): qty 1

## 2020-09-17 MED ORDER — PANTOPRAZOLE SODIUM 40 MG PO TBEC
40.0000 mg | DELAYED_RELEASE_TABLET | Freq: Every day | ORAL | Status: DC
  Administered 2020-09-18 – 2020-09-25 (×8): 40 mg via ORAL
  Filled 2020-09-17 (×8): qty 1

## 2020-09-17 MED ORDER — SODIUM CHLORIDE 0.9 % IV BOLUS
1000.0000 mL | Freq: Once | INTRAVENOUS | Status: AC
  Administered 2020-09-17: 1000 mL via INTRAVENOUS

## 2020-09-17 MED ORDER — FENTANYL CITRATE (PF) 100 MCG/2ML IJ SOLN
50.0000 ug | Freq: Once | INTRAMUSCULAR | Status: AC
  Administered 2020-09-17: 50 ug via INTRAVENOUS
  Filled 2020-09-17: qty 2

## 2020-09-17 MED ORDER — INSULIN ASPART 100 UNIT/ML ~~LOC~~ SOLN
0.0000 [IU] | Freq: Three times a day (TID) | SUBCUTANEOUS | Status: DC
  Administered 2020-09-18 – 2020-09-19 (×2): 1 [IU] via SUBCUTANEOUS
  Administered 2020-09-19: 3 [IU] via SUBCUTANEOUS
  Administered 2020-09-20: 1 [IU] via SUBCUTANEOUS
  Administered 2020-09-20: 2 [IU] via SUBCUTANEOUS
  Administered 2020-09-20 – 2020-09-21 (×2): 1 [IU] via SUBCUTANEOUS
  Administered 2020-09-21: 2 [IU] via SUBCUTANEOUS
  Administered 2020-09-21: 1 [IU] via SUBCUTANEOUS
  Administered 2020-09-22 – 2020-09-23 (×6): 2 [IU] via SUBCUTANEOUS
  Administered 2020-09-24 (×2): 3 [IU] via SUBCUTANEOUS
  Administered 2020-09-24: 1 [IU] via SUBCUTANEOUS
  Administered 2020-09-25: 3 [IU] via SUBCUTANEOUS
  Administered 2020-09-25: 2 [IU] via SUBCUTANEOUS

## 2020-09-17 MED ORDER — POTASSIUM CHLORIDE 10 MEQ/100ML IV SOLN
10.0000 meq | INTRAVENOUS | Status: AC
  Administered 2020-09-17 – 2020-09-18 (×3): 10 meq via INTRAVENOUS
  Filled 2020-09-17 (×3): qty 100

## 2020-09-17 MED ORDER — OXYCODONE HCL 5 MG PO TABS
10.0000 mg | ORAL_TABLET | Freq: Once | ORAL | Status: AC
Start: 2020-09-17 — End: 2020-09-18
  Administered 2020-09-18: 10 mg via ORAL
  Filled 2020-09-17: qty 2

## 2020-09-17 MED ORDER — ONDANSETRON HCL 4 MG/2ML IJ SOLN
4.0000 mg | Freq: Once | INTRAMUSCULAR | Status: AC
  Administered 2020-09-17: 4 mg via INTRAVENOUS
  Filled 2020-09-17: qty 2

## 2020-09-17 MED ORDER — CLOPIDOGREL BISULFATE 75 MG PO TABS: 75.0000 mg | ORAL_TABLET | Freq: Every morning | ORAL | Status: DC

## 2020-09-17 MED ORDER — ACETAMINOPHEN 325 MG PO TABS: 650.0000 mg | ORAL_TABLET | Freq: Four times a day (QID) | ORAL | Status: DC | PRN

## 2020-09-17 NOTE — ED Notes (Signed)
Attempted report x1. 

## 2020-09-17 NOTE — ED Notes (Signed)
Attempted report x 2. RN to call back.

## 2020-09-17 NOTE — ED Notes (Signed)
Skin tears to left wrist and Lt forearm redressed per patient request with nonadherent dressing and Kerlix. Patient assisted with repositioning and provided with urinal, denies further needs at this time.

## 2020-09-17 NOTE — ED Notes (Signed)
Sister in [place]: Hinda Kehr [phone]  Will be his ride when discharged.

## 2020-09-17 NOTE — ED Provider Notes (Signed)
[place] [facility] EMERGENCY DEPARTMENT Provider Note   CSN: [id number] Arrival date & time: 09/17/20  1441     History Chief Complaint  Patient presents with  . Fall    Bryan Scott is a 75 y.o. male.  HPI Patient reports that he fell while urinating.He was urinating and suddenly started to feel very lightheaded as if he is going to pass out.  He then went on to pass out and struck his chin.  He reports he also seem to hit the right shoulder and arm.  He said a lot of pain in his right shoulder and upper arm.  Also pain in the lower back.  Patient denies he feels weakness or numbness into his legs.  He also reports he got a big skin tear on his left arm.  His home health nurse had come just after he fell.  She has cleaned that and dressed that for him.  Patient is on Xarelto and Plavix.  Reports he bleeds and bruises very easily.  He reports he had a fall about 2 weeks ago.  A similar circumstance where he became lightheaded and passed out.  He reports he is getting evaluated for possible bleeding ulcer and or some intra-abdominal cancer.  Reports that he vomited all day yesterday and he thinks he is dehydrated.  He did not see blood while vomiting.  He reports he really has not been able to eat or drink.  Patient reports he takes blood pressure medications.  He did take 10 mg of morphine prior to coming.  He reports he still having a lot of pain in the same additional pain medicine for his lower back and arm and shoulder pain on the right.    No past medical history on file.  There are no problems to display for this patient.        No family history on file.     Home Medications Prior to Admission medications   Not on File    Allergies    Aspirin and Lisinopril  Review of Systems   Review of Systems 10 systems reviewed and negative except as per HPI Physical Exam Updated Vital Signs BP 109/76   Pulse 78   Temp (!) 97.4 F (36.3 C) (Axillary)   Resp 18    Ht 6' (1.829 m)   Wt 71.7 kg   SpO2 96%   BMI 21.43 kg/m   Physical Exam Constitutional:      Comments: Alert.  Clear mental status.  GCS 15.  No respiratory distress.  HENT:     Head:     Comments: Patient has a very small puncture type laceration to the inferior aspect of the chin.  No other contusions or hematomas to the face or head.    Nose: Nose normal.     Mouth/Throat:     Mouth: Mucous membranes are moist.     Pharynx: Oropharynx is clear.     Comments: Patient is edentulous.  No intraoral lacerations or injury Eyes:     Extraocular Movements: Extraocular movements intact.     Pupils: Pupils are equal, round, and reactive to light.  Neck:     Comments: No C-spine tenderness to palpation Cardiovascular:     Rate and Rhythm: Normal rate and regular rhythm.  Pulmonary:     Effort: Pulmonary effort is normal.     Breath sounds: Normal breath sounds.  Abdominal:     General: There is no distension.  Palpations: Abdomen is soft.     Comments: Patient endorses mild tenderness in the left mid abdomen.  No guarding.  No distention peer  Musculoskeletal:     Cervical back: Neck supple.     Comments: Patient dorsa significant pain to palpation over the right shoulder.  Not obviously deformed or dislocated.  He also notices pain in the humerus.  No effusion or deformity of the elbow or wrist on the right.  No pain of the left upper extremity.  Bilateral lower extremities no deformities or effusions.  Patient can move at the hips knees and ankles without pain  Neurological:     General: No focal deficit present.     Mental Status: He is oriented to person, place, and time.     Cranial Nerves: No cranial nerve deficit.     Motor: No weakness.     Coordination: Coordination normal.  Psychiatric:        Mood and Affect: Mood normal.     ED Results / Procedures / Treatments   Labs (all labs ordered are listed, but only abnormal results are displayed) Labs Reviewed  BASIC  METABOLIC PANEL - Abnormal; Notable for the following components:      Result Value   Sodium 131 (*)    Potassium 3.0 (*)    Chloride 94 (*)    Glucose, Bld 162 (*)    All other components within normal limits  CBC WITH DIFFERENTIAL/PLATELET - Abnormal; Notable for the following components:   WBC 15.9 (*)    RBC 6.04 (*)    Hemoglobin 20.1 (*)    HCT 55.8 (*)    Neutro Abs 12.5 (*)    All other components within normal limits  PROTIME-INR - Abnormal; Notable for the following components:   Prothrombin Time 29.0 (*)    INR 2.9 (*)    All other components within normal limits  PROTIME-INR - Abnormal; Notable for the following components:   Prothrombin Time 30.0 (*)    INR 3.0 (*)    All other components within normal limits  URINALYSIS, ROUTINE W REFLEX MICROSCOPIC  TYPE AND SCREEN  ABO/RH  TROPONIN I (HIGH SENSITIVITY)    EKG None EKG is not uploading from use.  EKG shows nonspecific ST depression inferior and lateral.  No STEMI.  No old available in [facility] Radiology DG Chest 1 View  Result Date: 09/17/2020 CLINICAL DATA:  Fall, right shoulder pain. EXAM: CHEST  1 VIEW COMPARISON:  None. FINDINGS: Prior CABG. Heart and mediastinal contours are within normal limits. No focal opacities or effusions. No acute bony abnormality. No visible displaced rib fracture or pneumothorax. IMPRESSION: No active disease. Electronically Signed   By: Rolm Baptise M.D.   On: 09/17/2020 18:02   DG Lumbar Spine 2-3 Views  Result Date: 09/17/2020 CLINICAL DATA:  Back pain, fall EXAM: LUMBAR SPINE - 2-3 VIEW COMPARISON:  None. FINDINGS: Partial fusion across the L3-4, L4-5 and L5-S1 disc spaces. Normal alignment. Moderate to advanced degenerative disc and facet disease diffusely. No fracture. IMPRESSION: Diffuse degenerative disc and facet disease. No acute bony abnormality. Electronically Signed   By: Rolm Baptise M.D.   On: 09/17/2020 18:01   DG Pelvis 1-2 Views  Result Date: 09/17/2020 CLINICAL DATA:   Fall, low back pain radiating to right side. EXAM: PELVIS - 1-2 VIEW COMPARISON:  None. FINDINGS: Symmetric degenerative changes in the hips. Degenerative changes in the visualized lower lumbar spine. No acute bony abnormality. Specifically, no fracture, subluxation, or  dislocation. IMPRESSION: No acute bony abnormality. Electronically Signed   By: Rolm Baptise M.D.   On: 09/17/2020 18:00   DG Shoulder Right  Result Date: 09/17/2020 CLINICAL DATA:  Right shoulder pain EXAM: RIGHT SHOULDER - 2+ VIEW COMPARISON:  None. FINDINGS: There is no evidence of fracture or dislocation. There is diffuse osteopenia. Moderate AC and glenohumeral joint osteoarthritis is seen with joint space loss. Mild overlying soft tissue swelling is seen. IMPRESSION: No acute osseous abnormality. Electronically Signed   By: Prudencio Pair M.D.   On: 09/17/2020 18:00   DG Humerus Right  Result Date: 09/17/2020 CLINICAL DATA:  Fall, right shoulder pain EXAM: RIGHT HUMERUS - 2+ VIEW COMPARISON:  None. FINDINGS: Moderate to advanced degenerative changes in the right [employer] joint with joint space narrowing and spurring. Early degenerative changes in the glenohumeral joint. No acute bony abnormality. Specifically, no fracture, subluxation, or dislocation. IMPRESSION: Degenerative changes in the right shoulder. No acute bony abnormality. Electronically Signed   By: Rolm Baptise M.D.   On: 09/17/2020 18:01    Procedures Procedures   Medications Ordered in ED Medications  0.9 %  sodium chloride infusion (has no administration in time range)  sodium chloride 0.9 % bolus 1,000 mL (has no administration in time range)  potassium chloride 10 mEq in 100 mL IVPB (has no administration in time range)  pantoprazole (PROTONIX) injection 40 mg (has no administration in time range)  fentaNYL (SUBLIMAZE) injection 50 mcg (50 mcg Intravenous Given 09/17/20 1531)  ondansetron (ZOFRAN) injection 4 mg (4 mg Intravenous Given 09/17/20 1650)   oxyCODONE-acetaminophen (PERCOCET/ROXICET) 5-325 MG per tablet 2 tablet (2 tablets Oral Given 09/17/20 1917)    ED Course  I have reviewed the triage vital signs and the nursing notes.  Pertinent labs & imaging results that were available during my care of the patient were reviewed by me and considered in my medical decision making (see chart for details).    MDM Rules/Calculators/A&P                           Consult: Family medicine teaching service for admission.  Patient reports that he had a lot of vomiting yesterday.  Today he had a syncopal episode resulting in a collapse in his bathroom.  He had has multiple areas of contusion, abrasion and pain.  Imaging does not show any acute fractures.  Patient is seen at the [place].  He reports he is in the process of getting diagnostic evaluation for a possible stomach cancer but does not have other details.  He reports he is supposed to get a scheduled upper endoscopy, but has no specific diagnoses that he is aware of right now.  This time, with generalized weakness, syncope, clinical dehydration and hemoconcentration with hypokalemia, will plan for admission for hydration, potassium replacement and further diagnostic evaluation as needed for underlying etiologies of vomiting and syncope. Final Clinical Impression(s) / ED Diagnoses Final diagnoses:  Syncope, unspecified syncope type  Dehydration  Nonspecific abnormal electrocardiogram (ECG) (EKG)  Hypokalemia    Rx / DC Orders ED Discharge Orders    None       Charlesetta Shanks, MD 09/17/20 2211

## 2020-09-17 NOTE — ED Triage Notes (Signed)
Level 2 trauma, Fall on Plavix. Syncopal episode in shower, hit chin and right side of body. Laceration to chin, tears to left arm. Home health nurse there and dressed left arm wounds and called EMS. PT took PO 10mg  Morphine at home.

## 2020-09-17 NOTE — ED Provider Notes (Signed)
MSE was initiated and I personally evaluated the patient and placed orders (if any) at  2:43 PM on September 17, 2020.  The patient appears stable so that the remainder of the MSE may be completed by another provider.  75 year old male syncopal event at home.  Was in the shower and passed out striking his chin on the commode.  He is on blood thinners.  He is complaining of low back pain, right chest wall pain, right shoulder pain and left forearm pain.   Hayden Rasmussen, MD 09/17/20 [phone]

## 2020-09-17 NOTE — H&P (Addendum)
[facility] Admission History and Physical Service Pager: [phone]  Patient name: Bryan Scott Medical record number: [id number] Date of birth: 1946/01/26 Age: 75 y.o. Gender: male  Primary Care Provider: [facility] Consultants: none Code Status: Full  Preferred Emergency Contact: Hinda Kehr [phone]  Chief Complaint: syncope secondary to fall  Assessment and Plan: JLON BETKER is a 75 y.o. male presenting with syncopal episode. PMH is significant for hypertension, Type 2 DM, atrial fibrillation, CAD, s/p CABG in 2007, s/p coronary stent in 2022, hyperlipidemia, spinal stenosis and history of tobacco use.   Syncope Patient presents with syncopal episode during micturition after a day of vomiting and diarrhea. On presentation, patient vitals stable with labs notable for Na 131, K 3, glucose 162, leukocytosis of 15.9, troponin flat and UA pending. In the ED, given percocet along with 1L NS bolus and IV zofran 4 mg. CT head w/o contrast unremarkable, no acute intracranial abnormality noted. Shoulder XR demonstrated no acute osseous abnormality. Patient has had history of recurrent falls with 3 falls in the past 2 months.  Most likely orthostatic syncope given this occurred after an episode of vomiting and diarrhea, but vasovagal also considered given it happened during micturition. Cardiogenic also possible given his afib hx, but less likely than other causes listed. Seizure also on the differential, given his fall was unwitnessed, but does not appear to have any post-ictal symptoms and no prior hx of seizures. Orthostatic hypotension also a concern, pending orthostatic vitals. Given patient's electrolyte derangements, this could be a concern or caused by decreased oral intake and hydration possibly secondary to ulcer that is decreasing appetite thus resulting in repetitive falls leading to multiple hospitalizations. Likely vasovagal syncope but  multifactorial given the presence of a number of contributing factors. Admitted for ongoing syncope workup and monitoring.  -Admit to FPTS, attending Dr. Nori Riis -continuous cardiac monitoring  -125 ml/hr NS -pending UA -f/u am CBC and BMP -f/u am EKG -f/u CK and PT-INR - orthostatic vitals in am -encourage oral intake as tolerated  -up with assistance  -PT/OT eval and treat -ensure safe disposition   Skin abrasions  bruising on long term AC use Bruising due to long term xarelto and plavix use.  Pt has skin tearing of the left forearm and wrist. Currently wrapped in gauze to help control bleeding.  Will need to monitor for continued bleeding given his anticoagulant use.   - continue plavix for recent NSTEMI/PCI - holding xarelto for pAF (not currently in afib)  Nausea and vomiting Patient reports repeated episodes of chronic nausea and vomiting for the past few months. States most recent (unknown)medication he was prescribed for this helped for a while to prevent vomiting prior to today's vomiting episode. This issue is currently being worked up by the [place] and pt is unsure whether it is 'an ulcer' or a tumor they are worried about. Previous notes from other hospitalizations mention a 'pancreatic mass' and there is also mention in care everywhere from the [place] regarding malignant neoplasm of the pancreas. We do not have access to his detailed records from the [place].  Has had prior hospitalization for pancreatitis as recently as last month. He had CT abd, abd u/s, and MRCP at that time and has not found any significant abnormalities other than a 84mm nonobstructing renal stone and 'borderline prominence' of the pancreatic duct.  Labs showed elevated lipase and hyperbilirubinemia. Following discharge, was instructed that he would need EGD outpatient which has yet to  have been performed. Home medications include protonix 40 mg daily and carafate 1g qid daily. -consider GI consult in AM for possible  EGD -continue home protonix -continue home carafate  - f/u lipase and LFTs added on to previous labwork - holding xarelto until we can sort out if pt has gastric ulcer.   - 163ml/hr mIVF - zofran prn  Hypertension Normotensive BP on admission of 121/83. Home medications include coreg 3.125 mg bid daily and imdur 30 mg daily, which patient endorses daily compliance. -continue coreg 3.125 mg bid daily  -monitor BP  Type 2 DM Blood glucose on admission 162 and most recent A1c 8 on 04/17/2021. Home medications include jardiance 25 mg daily and metformin 500 mg bid daily. -hold home jardiance -hold home metformin -sSSI  -monitor CBG -pending A1c  Paroxysmal atrial fibrillation  EKG notable for sinus rhythm without ST elevations, no a fib noted. On admission not in atrial fibrillation with HR 76.  Home medications include xarelto and coreg. -hold xarelto, consider restarting in the am - continue coreg  CAD 2/2 CABG and recent stent placement Pt has a previous hx of cabg.  In January he had a stent placed for NSTEMI.  Takes plavix, atorvastatin, imdur - continue home meds   Leukocytosis  Elevated Hgb On admission, WBC 15.9, Hgb 20.1 and hematocrit 55.8. Likely secondary to hemoconcentration given dehydration status. Low concern for infectious process given CXR demonstrated no active disease, maintenance of afebrile status and no obvious clinical sign of infection. -monitor clinically -am CBC -pending UA  Electrolyte abnormalities Na 131 and K 3.0 on admission, likely due to dehydration 2/2 vomiting.  -IV KCl 10 mEq in 100 ml  -monitor am BMP  CAD  s/p stent placement  History of CAD with coronary stent placed on 07/13/2020. Most recent echo in march 2022 notable for EF 70-75% along with Grade 1 diastolic dysfunction. Home meds include plavix 75 mg. -continue home plavix   Hyperlipidemia Most recent lipid panel on 08/08/2020 unremarkable. Home medications include atorvastatin 80  mg daily and zetia 10 mg daily. -continue home statin therapy -continue home zetia  Chronic pain syndrome  Spinal stenosis  Endorsing back pain that limits neurological exam. Reports extensive surgical history include 4 back and 2 neck-related surgeries. Lumbar XR demonstrated diffuse degenerative disc and facet disease without acute bony abnormality. Home medications include gabapentin 100 mg tid daily. Pt states he was on morphine at home , but recently tapered off it.  Given  5mg  percocet x2 in the ED.  -continue home gabapentin -oxycodone 10 mg once   FEN/GI: heart healthy/carb modified diet Prophylaxis: on full dose anticoagulation (xarelto held)  Disposition: admit to med telemetry, attending Dr. Nori Riis  History of Present Illness:  BURNELL HURTA is a 75 y.o. male presenting with possible syncope after a fall. Reports that he had been experiencing nausea and a lot of vomiting over the past few month but more apparent yesterday. He went to the bathroom today and ended up falling on his chin. He states that he has a headache after hitting his head but denies loss of consciousness. Denies seizure history. He did end up urinating on himself. He lives along and ambulates with assistance of a 4 wheel walker with a seat. He is able to carry out all his daily activities independently. Has been experiencing dyspnea but denies chest pain. Endorsing dizziness, weakness, light-headedness and some abdominal pain that he thinks is from recent vomiting.   Shares that he  was found to have an ulcer with concern for possible abdominal malignancy although current workup is still pending outpatient as he was supposed to undergo an EGD on Thursday later this week. Endorsing appetite changes and decreased intake along with hydration. Apparently his nausea and vomiting have been an ongoing issue as he was recently hospitalized for something similar about a month ago. He sees the [place] for his PCP needs.  Reports that he  quit smoking 50 years ago and has not used alcohol in 15 years. Denies illicit drug use.   Review Of Systems: Per HPI with the following additions:   Review of Systems  Constitutional: Positive for appetite change. Negative for fever.  Eyes: Negative for visual disturbance.  Respiratory: Positive for shortness of breath.   Cardiovascular: Negative for chest pain.  Gastrointestinal: Positive for diarrhea, nausea and vomiting. Negative for abdominal pain and blood in stool.  Musculoskeletal: Positive for back pain.  Neurological: Positive for dizziness, syncope, weakness, light-headedness and headaches.     Patient Active Problem List   Diagnosis Date Noted  . Syncope 09/17/2020    Past Medical History: No past medical history on file.  Past Surgical History: Coronary stent placement 2022 Prior back and neck surgeries   Social History:   Additional social history: Quit smoking 50 years ago. Denies alcohol and illicit drug use. Please also refer to relevant sections of EMR.  Family History: No family history on file.   Allergies and Medications: Allergies  Allergen Reactions  . Aspirin Swelling  . Lisinopril Anaphylaxis   No current facility-administered medications on file prior to encounter.   No current outpatient medications on file prior to encounter.    Objective: BP 109/76   Pulse 78   Temp (!) 97.4 F (36.3 C) (Axillary)   Resp 18   Ht 6' (1.829 m)   Wt 71.7 kg   SpO2 96%   BMI 21.43 kg/m  Exam: General: Patient laying in bed comfortably, in no acute distress. Eyes: PERRLA, sclera white ENTM: No head lacerations noted, small laceration on chin without active bleeding Neck: supple neck without evidence of lymphadenopathy  Cardiovascular: RRR, no murmurs or gallops auscultated  Respiratory: CTAB, no wheezing, rales or rhonchi noted Gastrointestinal: soft, mild tenderness more prominently along LUQ, nondistended, presence of active bowel sounds Ext:  no LE edema noted bilaterally, radial and distal pulses strong and equal bilaterally, left UE covered in clean dressing with very mild saturation Derm: patches of ecchymosis along UE bilaterally, skin warm and dry to touch Neuro: AOx4, decreased sensation along left LE, 5/5 grip strength, able to move all extremities, LE strength limited by back pain Psych: mood appropriate   Labs and Imaging: CBC BMET  Recent Labs  Lab 09/17/20 1450  WBC 15.9*  HGB 20.1*  HCT 55.8*  PLT 247   Recent Labs  Lab 09/17/20 1450  NA 131*  K 3.0*  CL 94*  CO2 23  BUN 16  CREATININE 1.20  GLUCOSE 162*  CALCIUM 9.6       DG Chest 1 View  Result Date: 09/17/2020 CLINICAL DATA:  Fall, right shoulder pain. EXAM: CHEST  1 VIEW COMPARISON:  None. FINDINGS: Prior CABG. Heart and mediastinal contours are within normal limits. No focal opacities or effusions. No acute bony abnormality. No visible displaced rib fracture or pneumothorax. IMPRESSION: No active disease. Electronically Signed   By: Rolm Baptise M.D.   On: 09/17/2020 18:02   DG Lumbar Spine 2-3 Views  Result Date:  09/17/2020 CLINICAL DATA:  Back pain, fall EXAM: LUMBAR SPINE - 2-3 VIEW COMPARISON:  None. FINDINGS: Partial fusion across the L3-4, L4-5 and L5-S1 disc spaces. Normal alignment. Moderate to advanced degenerative disc and facet disease diffusely. No fracture. IMPRESSION: Diffuse degenerative disc and facet disease. No acute bony abnormality. Electronically Signed   By: Rolm Baptise M.D.   On: 09/17/2020 18:01   DG Pelvis 1-2 Views  Result Date: 09/17/2020 CLINICAL DATA:  Fall, low back pain radiating to right side. EXAM: PELVIS - 1-2 VIEW COMPARISON:  None. FINDINGS: Symmetric degenerative changes in the hips. Degenerative changes in the visualized lower lumbar spine. No acute bony abnormality. Specifically, no fracture, subluxation, or dislocation. IMPRESSION: No acute bony abnormality. Electronically Signed   By: Rolm Baptise M.D.   On:  09/17/2020 18:00   DG Shoulder Right  Result Date: 09/17/2020 CLINICAL DATA:  Right shoulder pain EXAM: RIGHT SHOULDER - 2+ VIEW COMPARISON:  None. FINDINGS: There is no evidence of fracture or dislocation. There is diffuse osteopenia. Moderate AC and glenohumeral joint osteoarthritis is seen with joint space loss. Mild overlying soft tissue swelling is seen. IMPRESSION: No acute osseous abnormality. Electronically Signed   By: Prudencio Pair M.D.   On: 09/17/2020 18:00   DG Humerus Right  Result Date: 09/17/2020 CLINICAL DATA:  Fall, right shoulder pain EXAM: RIGHT HUMERUS - 2+ VIEW COMPARISON:  None. FINDINGS: Moderate to advanced degenerative changes in the right [facility] joint with joint space narrowing and spurring. Early degenerative changes in the glenohumeral joint. No acute bony abnormality. Specifically, no fracture, subluxation, or dislocation. IMPRESSION: Degenerative changes in the right shoulder. No acute bony abnormality. Electronically Signed   By: Rolm Baptise M.D.   On: 09/17/2020 18:01    Donney Dice, DO 09/17/2020, 10:13 PM PGY-1, [facility] Intern pager: [phone], text pages welcome  Resident Addendum I have separately seen and examined the patient.  I have discussed the findings and exam with the resident and agree with the above note.  I helped develop the management plan that is described in the student's note and I agree with the content.     Addison Naegeli, MD PGY-3 [facility] residency program

## 2020-09-17 NOTE — ED Notes (Signed)
Patient to CT at this time

## 2020-09-17 NOTE — Progress Notes (Signed)
Orthopedic Tech Progress Note Patient Details:  Bryan Scott 1946/01/19 [id number] Level 2 trauma Patient ID: Bryan Scott, male   DOB: 18-Apr-1946, 75 y.o.   MRN: [id number]   [name] 09/17/2020, 4:13 PM

## 2020-09-17 NOTE — ED Notes (Signed)
Admitting at bedside 

## 2020-09-18 ENCOUNTER — Encounter (HOSPITAL_COMMUNITY): Payer: Self-pay | Admitting: Family Medicine

## 2020-09-18 ENCOUNTER — Inpatient Hospital Stay (HOSPITAL_COMMUNITY): Payer: Medicare PPO

## 2020-09-18 ENCOUNTER — Other Ambulatory Visit: Payer: Self-pay

## 2020-09-18 DIAGNOSIS — R1084 Generalized abdominal pain: Secondary | ICD-10-CM

## 2020-09-18 DIAGNOSIS — E440 Moderate protein-calorie malnutrition: Secondary | ICD-10-CM | POA: Insufficient documentation

## 2020-09-18 DIAGNOSIS — W19XXXA Unspecified fall, initial encounter: Secondary | ICD-10-CM | POA: Diagnosis not present

## 2020-09-18 DIAGNOSIS — E860 Dehydration: Secondary | ICD-10-CM | POA: Diagnosis not present

## 2020-09-18 DIAGNOSIS — R55 Syncope and collapse: Secondary | ICD-10-CM | POA: Diagnosis not present

## 2020-09-18 DIAGNOSIS — E46 Unspecified protein-calorie malnutrition: Secondary | ICD-10-CM | POA: Diagnosis present

## 2020-09-18 LAB — CBC
HCT: 48.1 % (ref 39.0–52.0)
Hemoglobin: 17.1 g/dL — ABNORMAL HIGH (ref 13.0–17.0)
MCH: 33.1 pg (ref 26.0–34.0)
MCHC: 35.6 g/dL (ref 30.0–36.0)
MCV: 93 fL (ref 80.0–100.0)
Platelets: 206 10*3/uL (ref 150–400)
RBC: 5.17 MIL/uL (ref 4.22–5.81)
RDW: 13 % (ref 11.5–15.5)
WBC: 15 10*3/uL — ABNORMAL HIGH (ref 4.0–10.5)
nRBC: 0 % (ref 0.0–0.2)

## 2020-09-18 LAB — GLUCOSE, CAPILLARY
Glucose-Capillary: 103 mg/dL — ABNORMAL HIGH (ref 70–99)
Glucose-Capillary: 110 mg/dL — ABNORMAL HIGH (ref 70–99)
Glucose-Capillary: 136 mg/dL — ABNORMAL HIGH (ref 70–99)
Glucose-Capillary: 169 mg/dL — ABNORMAL HIGH (ref 70–99)

## 2020-09-18 LAB — RENAL FUNCTION PANEL
Albumin: 2.8 g/dL — ABNORMAL LOW (ref 3.5–5.0)
Anion gap: 6 (ref 5–15)
BUN: 21 mg/dL (ref 8–23)
CO2: 25 mmol/L (ref 22–32)
Calcium: 8.2 mg/dL — ABNORMAL LOW (ref 8.9–10.3)
Chloride: 103 mmol/L (ref 98–111)
Creatinine, Ser: 1.31 mg/dL — ABNORMAL HIGH (ref 0.61–1.24)
GFR, Estimated: 57 mL/min — ABNORMAL LOW (ref >60)
Glucose, Bld: 184 mg/dL — ABNORMAL HIGH (ref 70–99)
Phosphorus: 2.7 mg/dL (ref 2.5–4.6)
Potassium: 3.5 mmol/L (ref 3.5–5.1)
Sodium: 134 mmol/L — ABNORMAL LOW (ref 135–145)

## 2020-09-18 LAB — URINALYSIS, ROUTINE W REFLEX MICROSCOPIC
Bacteria, UA: NONE SEEN
Bilirubin Urine: NEGATIVE
Glucose, UA: >=500 mg/dL — AB
Ketones, ur: 20 mg/dL — AB
Leukocytes,Ua: NEGATIVE
Nitrite: NEGATIVE
Protein, ur: NEGATIVE mg/dL
Specific Gravity, Urine: 1.023 (ref 1.005–1.030)
pH: 5 (ref 5.0–8.0)

## 2020-09-18 LAB — BASIC METABOLIC PANEL
Anion gap: 15 (ref 5–15)
BUN: 18 mg/dL (ref 8–23)
CO2: 20 mmol/L — ABNORMAL LOW (ref 22–32)
Calcium: 8.9 mg/dL (ref 8.9–10.3)
Chloride: 98 mmol/L (ref 98–111)
Creatinine, Ser: 1.4 mg/dL — ABNORMAL HIGH (ref 0.61–1.24)
GFR, Estimated: 52 mL/min — ABNORMAL LOW (ref >60)
Glucose, Bld: 126 mg/dL — ABNORMAL HIGH (ref 70–99)
Potassium: 3.3 mmol/L — ABNORMAL LOW (ref 3.5–5.1)
Sodium: 133 mmol/L — ABNORMAL LOW (ref 135–145)

## 2020-09-18 LAB — TROPONIN I (HIGH SENSITIVITY): Troponin I (High Sensitivity): 8 ng/L (ref <18)

## 2020-09-18 LAB — HEMOGLOBIN A1C
Hgb A1c MFr Bld: 6.9 % — ABNORMAL HIGH (ref 4.8–5.6)
Mean Plasma Glucose: 151.33 mg/dL

## 2020-09-18 LAB — HEPATIC FUNCTION PANEL
ALT: 23 U/L (ref 0–44)
AST: 20 U/L (ref 15–41)
Albumin: 3.5 g/dL (ref 3.5–5.0)
Alkaline Phosphatase: 69 U/L (ref 38–126)
Bilirubin, Direct: 0.3 mg/dL — ABNORMAL HIGH (ref 0.0–0.2)
Indirect Bilirubin: 2.5 mg/dL — ABNORMAL HIGH (ref 0.3–0.9)
Total Bilirubin: 2.8 mg/dL — ABNORMAL HIGH (ref 0.3–1.2)
Total Protein: 7 g/dL (ref 6.5–8.1)

## 2020-09-18 LAB — PROTIME-INR
INR: 2.6 — ABNORMAL HIGH (ref 0.8–1.2)
Prothrombin Time: 26.8 seconds — ABNORMAL HIGH (ref 11.4–15.2)

## 2020-09-18 LAB — LIPASE, BLOOD: Lipase: 28 U/L (ref 11–51)

## 2020-09-18 LAB — CK: Total CK: 59 U/L (ref 49–397)

## 2020-09-18 LAB — SARS CORONAVIRUS 2 (TAT 6-24 HRS): SARS Coronavirus 2: NEGATIVE

## 2020-09-18 IMAGING — CT CT CTA ABD/PEL W/CM AND/OR W/O CM
2 of 5 series · 9 of 46 positions shown, 10 images · IV contrast (APPLIED)
Comparison: [DATE]
COMPARISON: [DATE]
COMPARISON: [DATE]

Addendum:
CLINICAL DATA: Upper GI bleed post endoscopy.

EXAM:
CTA ABDOMEN AND PELVIS WITHOUT AND WITH CONTRAST
TECHNIQUE: Multidetector CT imaging of the abdomen and pelvis was performed
using the standard protocol during bolus administration of
intravenous contrast. Multiplanar reconstructed images and MIPs were
obtained and reviewed to evaluate the vascular anatomy.
CONTRAST:  100mL OMNIPAQUE IOHEXOL 350 MG/ML SOLN

[Series 5: arterial 3.0 i40f 2 · axial · arterial · 0.76mm/px · z∈[+840,+1233]mm · 6 of 163 slices shown, 7 images]
[im 16/163  soft-tissue]
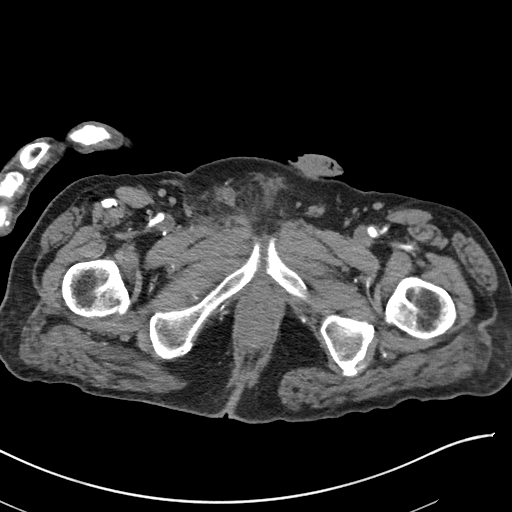
[im 16/163  bone]
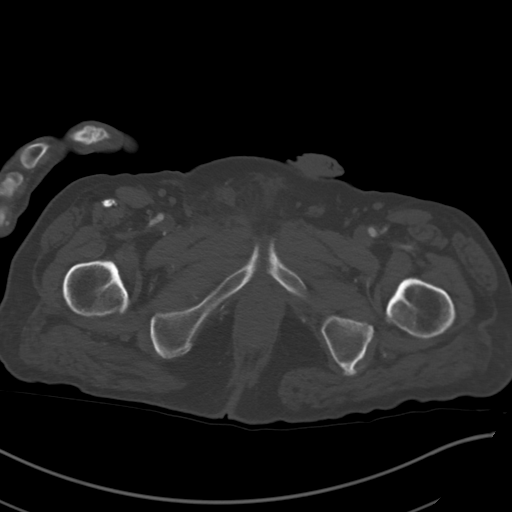
[im 42/163  soft-tissue]
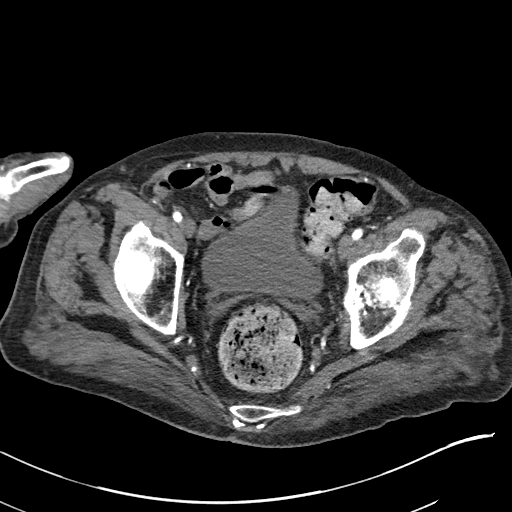
[im 68/163  soft-tissue]
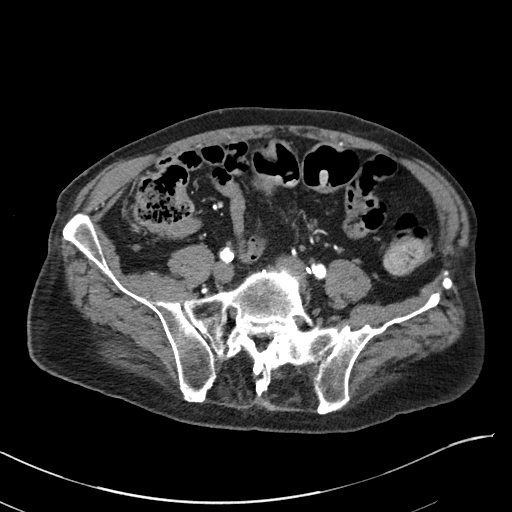
[im 95/163  soft-tissue]
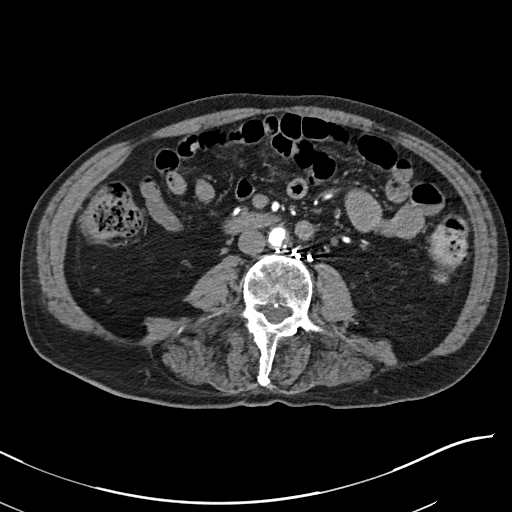
[im 121/163  soft-tissue]
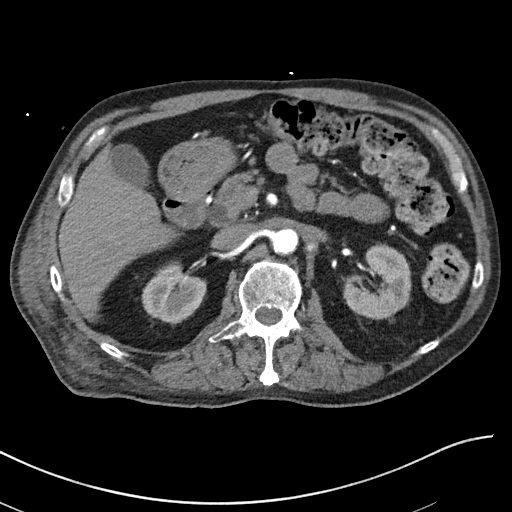
[im 147/163  soft-tissue]
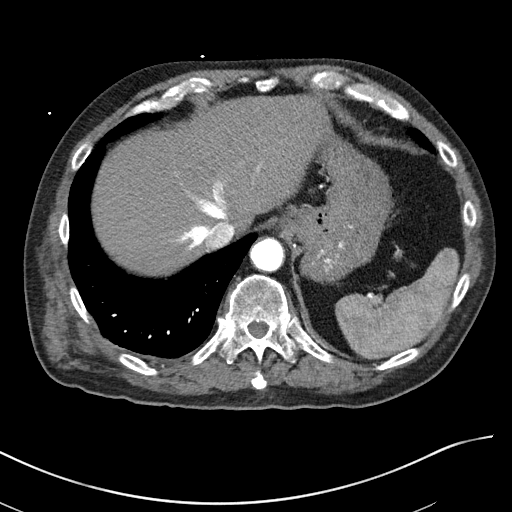

[Series 7: coronals · coronal · 0.73mm/px · 3 of 151 slices shown]
[im 38/151  soft-tissue]
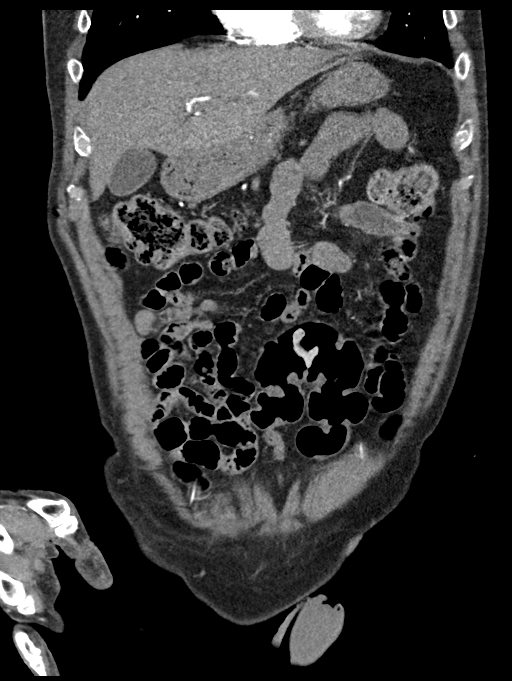
[im 76/151  soft-tissue]
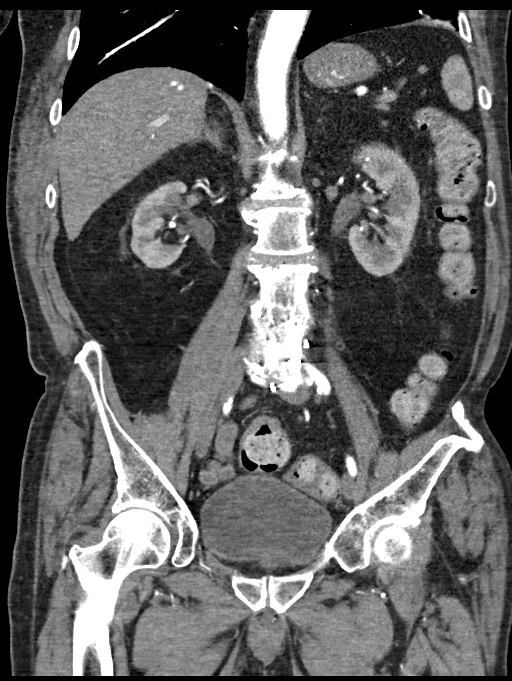
[im 113/151  soft-tissue]
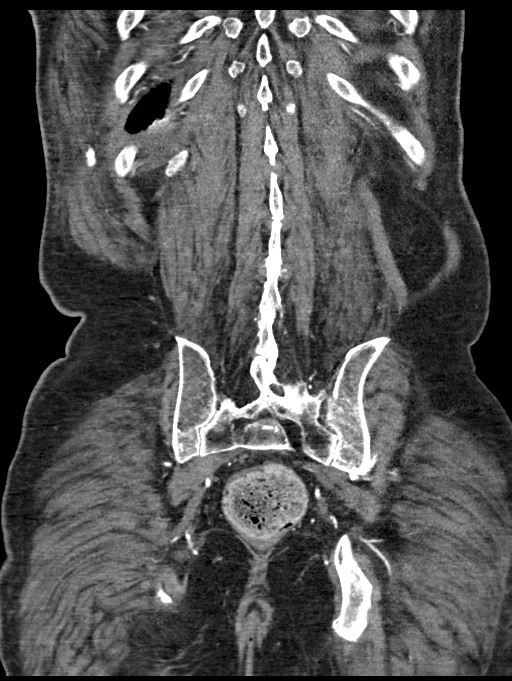

[9 of 46 positions shown; findings below may reference images not displayed]

FINDINGS: VASCULAR

Aorta: Atherosclerotic changes are noted without evidence for an
aneurysm.

Celiac: There is variant celiac artery anatomy. The common hepatic
artery arises directly from the aorta. There is significant
narrowing of the origin of the common hepatic artery. The splenic
artery arises directly from the aorta. There is no significant
narrowing.

SMA: There is mild-to-moderate narrowing involving the SMA.

Renals: There is mild narrowing of the left renal artery. There is
mild narrowing of the right renal artery.

IMA: There is moderate narrowing at the origin of the IMA.

Inflow: There is a right common iliac artery stent that is patent.
There is mild narrowing of the distal right common iliac artery.

Proximal Outflow: Bilateral common femoral and visualized portions
of the superficial and profunda femoral arteries are patent without
evidence of aneurysm, dissection, vasculitis or significant
stenosis.

Veins: No obvious venous abnormality within the limitations of this
arterial phase study.

Review of the MIP images confirms the above findings.

NON-VASCULAR

Lower chest: There is atelectasis at the lung bases.The heart size
is mildly enlarged. There is reflux of contrast into the IVC.

Hepatobiliary: The liver is normal. Normal gallbladder.There is no
biliary ductal dilation.

Pancreas: The pancreatic duct is slightly prominent.

Spleen: Unremarkable.

Adrenals/Urinary Tract:

--Adrenal glands: Unremarkable.

--Right kidney/ureter: Again noted is a nonobstructing stone in the
right kidney measuring approximately 1.4 cm. There is no
hydronephrosis.

--Left kidney/ureter: No hydronephrosis or radiopaque kidney stones.

--Urinary bladder: Unremarkable.

Stomach/Bowel:

--Stomach/Duodenum: There is hyperdense material within stomach.

--Small bowel: Unremarkable.

--Colon: Unremarkable.

--Appendix: Normal.

Vascular/Lymphatic: Atherosclerotic calcification is present within
the non-aneurysmal abdominal aorta, without hemodynamically
significant stenosis. There is mild-to-moderate narrowing of the SMA
both in the mid and distal sections. There is mild narrowing of the
left renal artery.

--No retroperitoneal lymphadenopathy.

--No mesenteric lymphadenopathy.

--No pelvic or inguinal lymphadenopathy.

Reproductive: Unremarkable

Other: No ascites or free air. The abdominal wall is normal.

Musculoskeletal. There are advanced degenerative changes throughout
the lumbar spine. No acute fracture.
IMPRESSION: 1. Exam is significantly limited by the lack of a noncontrast phase
and a the late venous phase.
2. While there is no definite active arterial bleeding, there is
hyperdense material within the stomach which is nonspecific. This
could represent extravasated contrast or hyperdense food material.
If there is clinical concern for ongoing GI bleeding, a repeat study
should be performed with a noncontrast, arterial, and venous phase.
3. Nonobstructing right-sided nephrolithiasis.
4. Chronic findings as detailed above.
5.  Aortic Atherosclerosis ([DG]-[DG]).

ADDENDUM:
There is a contusion involving the patient's right flank. There is a
small 0.7 cm hyperattenuating area in the posterior right flank
(axial series 5, image 38). This was not visualized on the patient's
prior study from [DATE]. There is an additional smaller
hyperattenuating focus more superiorly measuring approximately 6 mm
(axial series 5, image 26). Both of these areas are concerning for
areas of active extravasation versus is small pseudoaneurysms in the
setting of trauma. Exam is limited by lack of a venous phase.

These results were called by telephone at the time of interpretation
on [DATE] at [DATE] to provider WILSON JOSUE , who verbally acknowledged
these results.

ADDENDUM:
No visible lower rib fracture or focal pathology.

*** End of Addendum ***
Addendum:
FINDINGS: VASCULAR

Aorta: Atherosclerotic changes are noted without evidence for an
aneurysm.

Celiac: There is variant celiac artery anatomy. The common hepatic
artery arises directly from the aorta. There is significant
narrowing of the origin of the common hepatic artery. The splenic
artery arises directly from the aorta. There is no significant
narrowing.

SMA: There is mild-to-moderate narrowing involving the SMA.

Renals: There is mild narrowing of the left renal artery. There is
mild narrowing of the right renal artery.

IMA: There is moderate narrowing at the origin of the IMA.

Inflow: There is a right common iliac artery stent that is patent.
There is mild narrowing of the distal right common iliac artery.

Proximal Outflow: Bilateral common femoral and visualized portions
of the superficial and profunda femoral arteries are patent without
evidence of aneurysm, dissection, vasculitis or significant
stenosis.

Veins: No obvious venous abnormality within the limitations of this
arterial phase study.

Review of the MIP images confirms the above findings.

NON-VASCULAR

Lower chest: There is atelectasis at the lung bases.The heart size
is mildly enlarged. There is reflux of contrast into the IVC.

Hepatobiliary: The liver is normal. Normal gallbladder.There is no
biliary ductal dilation.

Pancreas: The pancreatic duct is slightly prominent.

Spleen: Unremarkable.

Adrenals/Urinary Tract:

--Adrenal glands: Unremarkable.

--Right kidney/ureter: Again noted is a nonobstructing stone in the
right kidney measuring approximately 1.4 cm. There is no
hydronephrosis.

--Left kidney/ureter: No hydronephrosis or radiopaque kidney stones.

--Urinary bladder: Unremarkable.

Stomach/Bowel:

--Stomach/Duodenum: There is hyperdense material within stomach.

--Small bowel: Unremarkable.

--Colon: Unremarkable.

--Appendix: Normal.

Vascular/Lymphatic: Atherosclerotic calcification is present within
the non-aneurysmal abdominal aorta, without hemodynamically
significant stenosis. There is mild-to-moderate narrowing of the SMA
both in the mid and distal sections. There is mild narrowing of the
left renal artery.

--No retroperitoneal lymphadenopathy.

--No mesenteric lymphadenopathy.

--No pelvic or inguinal lymphadenopathy.

Reproductive: Unremarkable

Other: No ascites or free air. The abdominal wall is normal.

Musculoskeletal. There are advanced degenerative changes throughout
the lumbar spine. No acute fracture.
IMPRESSION: 1. Exam is significantly limited by the lack of a noncontrast phase
and a the late venous phase.
2. While there is no definite active arterial bleeding, there is
hyperdense material within the stomach which is nonspecific. This
could represent extravasated contrast or hyperdense food material.
If there is clinical concern for ongoing GI bleeding, a repeat study
should be performed with a noncontrast, arterial, and venous phase.
3. Nonobstructing right-sided nephrolithiasis.
4. Chronic findings as detailed above.
5.  Aortic Atherosclerosis ([DG]-[DG]).

ADDENDUM:
There is a contusion involving the patient's right flank. There is a
small 0.7 cm hyperattenuating area in the posterior right flank
(axial series 5, image 38). This was not visualized on the patient's
prior study from [DATE]. There is an additional smaller
hyperattenuating focus more superiorly measuring approximately 6 mm
(axial series 5, image 26). Both of these areas are concerning for
areas of active extravasation versus is small pseudoaneurysms in the
setting of trauma. Exam is limited by lack of a venous phase.

These results were called by telephone at the time of interpretation
on [DATE] at [DATE] to provider WILSON JOSUE , who verbally acknowledged
these results.

*** End of Addendum ***
FINDINGS: VASCULAR

Aorta: Atherosclerotic changes are noted without evidence for an
aneurysm.

Celiac: There is variant celiac artery anatomy. The common hepatic
artery arises directly from the aorta. There is significant
narrowing of the origin of the common hepatic artery. The splenic
artery arises directly from the aorta. There is no significant
narrowing.

SMA: There is mild-to-moderate narrowing involving the SMA.

Renals: There is mild narrowing of the left renal artery. There is
mild narrowing of the right renal artery.

IMA: There is moderate narrowing at the origin of the IMA.

Inflow: There is a right common iliac artery stent that is patent.
There is mild narrowing of the distal right common iliac artery.

Proximal Outflow: Bilateral common femoral and visualized portions
of the superficial and profunda femoral arteries are patent without
evidence of aneurysm, dissection, vasculitis or significant
stenosis.

Veins: No obvious venous abnormality within the limitations of this
arterial phase study.

Review of the MIP images confirms the above findings.

NON-VASCULAR

Lower chest: There is atelectasis at the lung bases.The heart size
is mildly enlarged. There is reflux of contrast into the IVC.

Hepatobiliary: The liver is normal. Normal gallbladder.There is no
biliary ductal dilation.

Pancreas: The pancreatic duct is slightly prominent.

Spleen: Unremarkable.

Adrenals/Urinary Tract:

--Adrenal glands: Unremarkable.

--Right kidney/ureter: Again noted is a nonobstructing stone in the
right kidney measuring approximately 1.4 cm. There is no
hydronephrosis.

--Left kidney/ureter: No hydronephrosis or radiopaque kidney stones.

--Urinary bladder: Unremarkable.

Stomach/Bowel:

--Stomach/Duodenum: There is hyperdense material within stomach.

--Small bowel: Unremarkable.

--Colon: Unremarkable.

--Appendix: Normal.

Vascular/Lymphatic: Atherosclerotic calcification is present within
the non-aneurysmal abdominal aorta, without hemodynamically
significant stenosis. There is mild-to-moderate narrowing of the SMA
both in the mid and distal sections. There is mild narrowing of the
left renal artery.

--No retroperitoneal lymphadenopathy.

--No mesenteric lymphadenopathy.

--No pelvic or inguinal lymphadenopathy.

Reproductive: Unremarkable

Other: No ascites or free air. The abdominal wall is normal.

Musculoskeletal. There are advanced degenerative changes throughout
the lumbar spine. No acute fracture.
IMPRESSION: 1. Exam is significantly limited by the lack of a noncontrast phase
and a the late venous phase.
2. While there is no definite active arterial bleeding, there is
hyperdense material within the stomach which is nonspecific. This
could represent extravasated contrast or hyperdense food material.
If there is clinical concern for ongoing GI bleeding, a repeat study
should be performed with a noncontrast, arterial, and venous phase.
3. Nonobstructing right-sided nephrolithiasis.
4. Chronic findings as detailed above.
5.  Aortic Atherosclerosis ([DG]-[DG]).

## 2020-09-18 MED ORDER — ACETAMINOPHEN 500 MG PO TABS
1000.0000 mg | ORAL_TABLET | Freq: Once | ORAL | Status: AC
  Administered 2020-09-18: 1000 mg via ORAL
  Filled 2020-09-18: qty 2

## 2020-09-18 MED ORDER — SODIUM CHLORIDE 0.9 % IV SOLN: INTRAVENOUS | Status: DC

## 2020-09-18 MED ORDER — OXYCODONE HCL 5 MG PO TABS
5.0000 mg | ORAL_TABLET | ORAL | Status: DC | PRN
  Filled 2020-09-18: qty 1

## 2020-09-18 MED ORDER — OXYCODONE HCL 5 MG PO TABS
10.0000 mg | ORAL_TABLET | Freq: Four times a day (QID) | ORAL | Status: DC | PRN
  Administered 2020-09-18 – 2020-09-21 (×11): 10 mg via ORAL
  Filled 2020-09-18 (×11): qty 2

## 2020-09-18 MED ORDER — ENSURE ENLIVE PO LIQD
237.0000 mL | Freq: Three times a day (TID) | ORAL | Status: DC
  Administered 2020-09-18 – 2020-09-25 (×9): 237 mL via ORAL

## 2020-09-18 MED ORDER — IOHEXOL 350 MG/ML SOLN
100.0000 mL | Freq: Once | INTRAVENOUS | Status: AC | PRN
  Administered 2020-09-18: 100 mL via INTRAVENOUS

## 2020-09-18 MED ORDER — HEPARIN SODIUM (PORCINE) 5000 UNIT/ML IJ SOLN: 5000.0000 [IU] | Freq: Three times a day (TID) | INTRAMUSCULAR | Status: DC

## 2020-09-18 MED ORDER — MAGNESIUM SULFATE 2 GM/50ML IV SOLN
2.0000 g | Freq: Once | INTRAVENOUS | Status: AC
  Administered 2020-09-18: 2 g via INTRAVENOUS
  Filled 2020-09-18: qty 50

## 2020-09-18 MED ORDER — POTASSIUM CHLORIDE CRYS ER 20 MEQ PO TBCR
40.0000 meq | EXTENDED_RELEASE_TABLET | Freq: Once | ORAL | Status: AC
  Administered 2020-09-18: 40 meq via ORAL
  Filled 2020-09-18: qty 2

## 2020-09-18 MED ORDER — ACETAMINOPHEN 325 MG PO TABS
650.0000 mg | ORAL_TABLET | Freq: Four times a day (QID) | ORAL | Status: DC
  Administered 2020-09-18 – 2020-09-25 (×29): 650 mg via ORAL
  Filled 2020-09-18 (×29): qty 2

## 2020-09-18 MED ORDER — ONDANSETRON 4 MG PO TBDP
4.0000 mg | ORAL_TABLET | Freq: Three times a day (TID) | ORAL | Status: DC | PRN
  Administered 2020-09-18 – 2020-09-19 (×3): 4 mg via ORAL
  Filled 2020-09-18 (×3): qty 1

## 2020-09-18 MED ORDER — CLOPIDOGREL BISULFATE 75 MG PO TABS
75.0000 mg | ORAL_TABLET | Freq: Every morning | ORAL | Status: DC
  Administered 2020-09-18: 75 mg via ORAL
  Filled 2020-09-18: qty 1

## 2020-09-18 NOTE — Progress Notes (Addendum)
Family Medicine Teaching Service Daily Progress Note Intern Pager: [phone]  Patient name: Bryan Scott Medical record number: [id number] Date of birth: December 26, 1945 Age: 75 y.o. Gender: male  Primary Care Provider: [facility], [facility] Consultants: None Code Status: Full  Pt Overview and Major Events to Date:  4/4 admitted  Assessment and Plan: Bryan Scott is a 75 year old male presenting with nausea, vomiting, abrasions and contusions secondary to syncopal episode.  PMH includes HTN, T2DM, A. fib, CAD s/p CABG 2007 and s/p coronary stent 2002, HLD, spinal stenosis, history of tobacco use.  Syncope Patient complains of continued dizziness upon sitting and standing.  Orthostatic vitals curious, given measured significant increase in BP with sitting and standing: Lying: 146/108 (121) Sitting: 154/142 (147) Standing: 206/195 (200) Admission troponin negative x2, CK and lipase normal.  Admission PT/INR elevated at 26.8, 2.6 respectively.  UA demonstrates >500 glucose, small Hgb, 20 ketones, negative for infection.  Morning BMP significant for sodium 133, potassium 3.3, glucose 126, creatinine 1.40.  LFT panel shows indirect hyperbilirubinemia.  Morning EKG demonstrates NSR with QTC 463, no ST elevation. -Continuous cardiac monitoring -Continue IVF with NS at 125 mL/h -Encourage p.o. intake as tolerated -A.m. labs: CBC with differential, peripheral smear, CMP -A.m. EKG -PT OT eval and treat -Up with assistance  Nausea and vomiting  Concern for intra-abdominal process Continued nausea overnight.  Lipase normal as discussed above.  Extensive work-up previously at outside hospitals with outpatient GI: See note from Bryan Scott 4/5 at 10:31 AM for summary of all studies found per chart review today.  Per GI note, apparently MRCP in Jan 2022 concerning for pancreatic cancer; ERCP recommended but patient on plavix at the time for CAD as discussed below. 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 -Zofran ODT every 8 as needed -Protonix 40 mg daily -IV fluids with NS at 125 mils an hour -Consider possible GI consult, may need EGD  CAD s/p CABG 2007  NSTEMI s/p coronary stent 2022  HFrEF TTE on 3/26 at outside hospital shows LVEF 40 to 76%, grade 1 diastolic dysfunction (impaired relaxation). -Continuing home Plavix, atorvastatin, Imdur -Cardiology consulted, appreciate recommendations  Skin abrasions  bruising on long term AC use Restarted Plavix for recent NSTEMI and PCI.  Holding Xarelto, unclear records, may be chart error and not necessary.  -Wound care per nursing  Type 2 DM Glucose since admission ranged 103-136.  Admission A1c 6.9. -Continue sensitive sliding scale insulin -Holding home Jardiance and Metformin  Leukocytosis WBCs 15.9 admission, improved to 15.0 this morning.  Patient afebrile with temperatures 97.4-98.1 F. -A.m. CBC  Polycythemia Hemoglobin noted to be 20.1 on admission, now 17.1 today.  Likely due to hemodilution from IVF.  Wonder if polycythemia due to hemoconcentration secondary to dehydration.  UA negative for infection.  Low concern for infectious process given patient afebrile, CXR negative, no clinical signs of infection. -A.m. CBC with differential -Peripheral blood smear  Hyponatremia Sodium 131 admission, improved 133 today. -A.m. BMP  Hypokalemia Potassium 3.0 on admission, was given 10 mEq KCl IV in ED.  This morning potassium 3.3, subsequently given 40 mEq K-Dur tab at 0955. -A.m. BMP  Hypertension Patient largely normotensive overnight, isolated hypertensive measurements with systolics  146, 160.  No hypotensive episodes recorded. -Continue home meds of Coreg and Imdur  Chronic pain syndrome  Spinal stenosis  At home on gabapentin 100 mg 3 times daily daily.  Chronic pain likely exacerbated by ecchymosis and swelling on back and arm from fall. -Tylenol 650 mg every 6 -Home gabapentin 100 mg 3 times daily -Oxycodone IR 10 mg every 6 as needed for severe pain -Ice or heat as needed  Hyperlipidemia Continue home atorvastatin and Zetia.    FEN/GI: Heart healthy carb modified PPx: SCDs   Status is: Inpatient  Remains inpatient appropriate because:Ongoing active pain requiring inpatient pain management and Inpatient level of care appropriate due to severity of illness   Dispo: The patient is from: Home              Anticipated d/c is to: Home              Patient currently is not medically stable to d/c.   Difficult to place patient No  Subjective:  Patient lying in bed this morning, reports being quite a bit of pain.  Pain is mostly in back and arm no areas of ecchymosis and swelling secondary to the fall.  Painful for patient to move in bed, twist, or sit up.  Patient intermittently tearful, saying he is frustrated with the pain and nausea vomiting.  Still experiencing some nausea this morning.  Objective: Temp:  [97.4 F (36.3 C)-98.1 F (36.7 C)] 97.8 F (36.6 C) (04/05 1602) Pulse Rate:  [66-82] 66 (04/05 1602) Resp:  [16-20] 19 (04/05 1602) BP: (99-160)/(64-108) 115/64 (04/05 1602) SpO2:  [94 %-100 %] 98 % (04/05 1602) Physical Exam: General: Awake, alert, oriented, mild to moderate distress Cardiovascular: Regular rate and rhythm Respiratory: Clear to auscultation bilaterally Abdomen: Soft, nondistended, mild epigastric TTP  Laboratory: Recent Labs  Lab 09/17/20 1450 09/18/20 0135  WBC 15.9* 15.0*  HGB 20.1* 17.1*  HCT 55.8* 48.1  PLT 247 206   Recent Labs  Lab 09/17/20 1450 09/18/20 0135 09/18/20 0328  NA 131* 133*  --   K 3.0*  3.3*  --   CL 94* 98  --   CO2 23 20*  --   BUN 16 18  --   CREATININE 1.20 1.40*  --   CALCIUM 9.6 8.9  --   PROT  --   --  7.0  BILITOT  --   --  2.8*  ALKPHOS  --   --  69  ALT  --   --  23  AST  --   --  20  GLUCOSE 162* 126*  --    Imaging/Diagnostic Tests: CT HEAD WITHOUT CONTRAST 09/17/2020 FINDINGS: Brain: No acute intracranial abnormality. Specifically, no hemorrhage, hydrocephalus, mass lesion, acute infarction, or significant intracranial injury. Vascular: No hyperdense vessel or unexpected calcification. Skull: No acute calvarial abnormality. Sinuses/Orbits: No acute findings Other: None IMPRESSION: No acute intracranial abnormality.   Ezequiel Essex, MD 09/18/2020, 6:18 PM PGY-1, [facility] Intern pager: [phone], text pages welcome

## 2020-09-18 NOTE — Consult Note (Addendum)
Cardiology Consultation:   Patient ID: Bryan Scott MRN: [id number]; DOB: 1946/01/11  Admit date: 09/17/2020 Date of Consult: 09/18/2020  PCP:  Administ[facility], [place] Cardiologist:  [facility]    Patient Profile:   Bryan Scott is a 75 y.o. male with a PMH of CAD s/p CABG in 2006 and PCI with DES to [place] on 07/13/2020, Paroxsymal A fib ( on Xarelto), type 2 DM, hypertension,  hyperlipidemia, carotid artery stenosis, cervical spinal stenosis s/p C5-C7 ACDF, chronic pain syndrome, hx of tobacco use, who presented to the [facility] for syncope and fall at home, cardiology consult is requested by family medicine Dr. Dorcas Mcmurray.   History of Present Illness:   Bryan Scott with above PMH follows with cardiology at [facility]. He has known CAD, with hx of CABG x4 on 10/2004 at [facility]. Appears that he was lost to follow up with cardiology from 2007 to 2021. He has been hospitalized within [facility] system recurrently over the past 3 months. He was hospitalized here from 07/11/20 - 07/21/20 for NSTEMI. He underwent LHC 07/13/20 which showed multivessel CAD and bypass graft disease. Culprit lesion identified as 95% stenosis in SVG-RCA.  Successful PCI was performed to SVG-RCA. He was transitioned from IV heparin to Xarelto, recommended plavix for 12 month at discharge (allergy to ASA),  along with GDMT with coreg, imdur, lipitor, zetia, and PRN nitro.   He was hospitalized here from  08/07/20 to 08/09/20 for acute pancreatitis, AKI, and syncope 2/2 hypovolemia. He was again hospitalized here 09/03/20 -09/06/20 for chest pain, abdominal pain, nausea, vomiting, syncope. 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 He was started on PPI , sucralfate, and bowel regimen for symptoms and constipation relief, and was recommended  outpatient EGD and colonoscopy. He was seen by cardiology last on 09/06/20 here  for chest pain and sycnope concern. He had negative HsTrop x3, EKG stable, Echo repeated 09/04/20 with EF 70-75%, Grade I DD. It was felt his symptoms are due to dehydration, poor PO intake in the setting of GI complaints. He was recommended continue GDMT with plavix, statin, Imdur, coreg, zetia. It was noted he stopped his Xarelto for unclear reason and he was advised to resume anticoagulation.   Patient returned to ER 09/17/20 for a mechanical fall at home. He reports he was urinating and suddenly felt lightheaded and subsequently passed out. He was having vomiting and diarrhea for a day prior to this. He has suffered multiple skin abrasions of left forearm and wrsit and reports increased bruise and bleeding while taking his Plavix and Xarelto. He is currently admitted to family medicine. Cardiology consult is requested for further evaluation of syncope.    During encounter, patient states he has not been feeling well for a few months. He has been suffering ongoing abdominal pain, nausea, vomiting, and diarrhea for the past 2 months. He states his doctor told him it's not gallstone or pancreatitis, they are concerned for gastric cancer and is plan for EGD now for further evaluation. He reports significant weight loss from 250 pounds to 140 pounds over the past 2 months. He had experienced one episode of syncope in March 2022 resulted hospitalization at [facility] (see above).  He was urinating yesterday in the bathroom and felt sudden onset of lightheadness. He passed out subsequently. He does not recall any significant chest pain,  SOB, heart palpitation before or after the syncope. He woke up spontaneously, noted pain of his neck, back, arm and some skin tears of his left arm.  He states 2 weeks ago visiting nurse found him orthostatic with BP dropping significantly after he changes to standing position. He states he was told his BP  was elevated to 200s while standing earlier today. He noted his heart is racing and experience some SOB intermittently with exertional activities. He denied any significant orthopnea, leg swelling, weight gain.   Admission work up thus far revealed leukocytosis WBC 15000, Hgb elevated 17.1, PLT WNL. IN 2.6. Hgb A1C 6.9%. HsTrop negative x2. CMP showed hyponatremia 133, hypokalemia 3.3, bicarb 20, Cr 1.4, GFR 52, indirect hyperbilirubinemia 2.5. Lipase WNL. UA + glucose, Hgb, and ketones, with SG 1.023. CT brain without acute findings. EKG showed sinus rhythm with first degree AV block, ventricular rate of 69bpm, and non-specific T wave abnormalities when comparing to EKG from 09/03/20.     Past Medical History:  Diagnosis Date  . Back injuries   . Bladder cancer ([place])   . Chronic pain   . Hx of CABG   . Hx of cardiac cath     History reviewed. No pertinent surgical history.   Home Medications:  Prior to Admission medications   Medication Sig Start Date End Date Taking? Authorizing Provider  atorvastatin (LIPITOR) 80 MG tablet Take 80 mg by mouth daily.   Yes [provider]  carvedilol (COREG) 3.125 MG tablet Take 3.125 mg by mouth 2 (two) times daily. 07/19/20  Yes [provider]  clopidogrel (PLAVIX) 75 MG tablet Take 75 mg by mouth every morning. 07/24/20  Yes [provider]  Dextromethorphan-guaiFENesin (ROBITUSSIN DM PO) Take 5 mLs by mouth every 4 (four) hours as needed.   Yes [provider]  empagliflozin (JARDIANCE) 25 MG TABS tablet Take 25 mg by mouth daily.   Yes [provider]  ezetimibe (ZETIA) 10 MG tablet Take 10 mg by mouth daily.   Yes [provider]  gabapentin (NEURONTIN) 100 MG capsule Take 100 mg by mouth 3 (three) times daily.   Yes [provider]  isosorbide mononitrate (IMDUR) 30 MG 24 hr tablet Take 30 mg by mouth daily.   Yes [provider]  lactulose (CHRONULAC) 10 GM/15ML solution Take 30  mLs by mouth daily as needed for mild constipation. 09/06/20  Yes [provider]  metFORMIN (GLUCOPHAGE) 500 MG tablet Take 500 mg by mouth 2 (two) times daily with a meal.   Yes [provider]  Multiple Vitamins-Minerals (MULTIVITAMIN WITH MINERALS) tablet Take 1 tablet by mouth daily.   Yes [provider]  nitroGLYCERIN (NITROSTAT) 0.4 MG SL tablet Place 0.4 mg under the tongue every 5 (five) minutes as needed for chest pain. 07/19/20  Yes [provider]  Nutritional Supplements (ENSURE ORIGINAL PO) Take 237 mLs by mouth every evening. strawberry   Yes [provider]  pantoprazole (PROTONIX) 40 MG tablet Take 40 mg by mouth daily. 09/06/20  Yes [provider]  SENNA-PLUS 8.6-50 MG tablet Take 2 tablets by mouth 2 (two) times daily. 09/06/20  Yes [provider]  Sennosides (EX-LAX PO) Take 2 tablets by mouth daily as needed (constipation).   Yes [provider]  sucralfate (CARAFATE) 1 g tablet Take 1 g by mouth 4 (four) times daily. 09/06/20  Yes [provider]  Tetrahydrozoline HCl (VISINE OP) Place 1 drop into both eyes daily as needed (  dry red eyes).   Yes [provider]  XARELTO 20 MG TABS tablet Take 20 mg by mouth daily. 09/06/20  Yes [provider]    Inpatient Medications: Scheduled Meds: . acetaminophen  650 mg Oral Q6H  . atorvastatin  80 mg Oral Daily  . carvedilol  3.125 mg Oral BID WC  . clopidogrel  75 mg Oral q morning  . ezetimibe  10 mg Oral Daily  . feeding supplement  237 mL Oral TID BM  . gabapentin  100 mg Oral TID  . insulin aspart  0-9 Units Subcutaneous TID WC  . isosorbide mononitrate  30 mg Oral Daily  . pantoprazole  40 mg Oral Daily  . sucralfate  1 g Oral QID   Continuous Infusions: . sodium chloride 125 mL/hr at 09/18/20 1622  . magnesium sulfate bolus IVPB     PRN Meds: ondansetron, oxyCODONE  Allergies:    Allergies  Allergen Reactions  . Aspirin  Swelling  . Lisinopril Anaphylaxis    Social History:   Social History   Socioeconomic History  . Marital status: Single    Spouse name: Not on file  . Number of children: Not on file  . Years of education: Not on file  . Highest education level: Not on file  Occupational History  . Occupation: retired  Tobacco Use  . Smoking status: Former Research scientist (life sciences)  . Smokeless tobacco: Never Used  Vaping Use  . Vaping Use: Never used  Substance and Sexual Activity  . Alcohol use: Not Currently  . Drug use: Never  . Sexual activity: Not on file  Other Topics Concern  . Not on file  Social History Narrative  . Not on file   Social Determinants of Health   Financial Resource Strain: Not on file  Food Insecurity: Not on file  Transportation Needs: Not on file  Physical Activity: Not on file  Stress: Not on file  Social Connections: Not on file  Intimate Partner Violence: Not on file    Family History:   Mother: Hx of cardiac disease, CVA   ROS:  Please see the history of present illness.  Review of Systems  Constitutional: Positive for malaise/fatigue and weight loss.  HENT: Negative for sore throat.   Eyes: Negative for blurred vision.  Respiratory: Positive for shortness of breath. Negative for cough.   Cardiovascular: Negative for chest pain, palpitations, orthopnea and leg swelling.  Gastrointestinal: Positive for abdominal pain, diarrhea, nausea and vomiting. Negative for blood in stool and melena.  Genitourinary: Negative for dysuria and hematuria.  Musculoskeletal: Positive for back pain, falls and neck pain.  Skin:       Easy bruise and bleeding; skin tears of left arm   Neurological: Positive for dizziness and weakness.  Endo/Heme/Allergies: Bruises/bleeds easily.  Psychiatric/Behavioral: The patient is nervous/anxious.     Physical Exam/Data:   Vitals:   09/18/20 0527 09/18/20 0846 09/18/20 1200 09/18/20 1602  BP: 124/69 (!) 160/83 (!) 146/108 115/64  Pulse: 67  70 69 66  Resp: 18 16 18 19   Temp: 97.7 F (36.5 C) 97.6 F (36.4 C) 98.1 F (36.7 C) 97.8 F (36.6 C)  TempSrc: Oral Oral Oral Oral  SpO2: 97% 98% 98% 98%  Weight:      Height:        Intake/Output Summary (Last 24 hours) at 09/18/2020 1716 Last data filed at 09/18/2020 1602 Gross per 24 hour  Intake 1519.03 ml  Output 1000 ml  Net 519.03 ml  Last 3 Weights 09/17/2020  Weight (lbs) 158 lb  Weight (kg) 71.668 kg     Body mass index is 21.43 kg/m.  General:  Laying in bed, no acute distress, fatigued , globally weak  HEENT: Head atraumatic, oral mucosa is dry, no tongue lesions noted  Neck: no JVD, neck supple  Vascular: No carotid bruits; FA pulses 2+ bilaterally without bruits  Cardiac:  normal S1, S2; RRR; no murmur Lungs:  clear to auscultation bilaterally, no wheezing, rhonchi or rales  Abd: soft, non-distended, diffuse and generalized abdominal tenderness, no hepatomegaly  Ext: no edema Musculoskeletal:  No deformities, no focal weakness  Skin: warm and dry , left forearm skin tear with Kerlix dressing in place, decreased skin turgor  Neuro:  Alert and oriented x3, answer questions and follow commands appropriately, no focal abnormalities noted Psych:  Mild anxiety    EKG:  showed sinus rhythm with first degree AV block, ventricular rate of 69bpm, and non-specific T wave abnormalities when comparing to EKG from 09/03/20.   Telemetry:  Telemetry was personally reviewed and demonstrates:  Sinus rhythm with rate of 60-70s, occasional PAC and PVCs   Relevant CV Studies:  TTE 09/04/20:  1. Left ventricular ejection fraction, by estimation, is 70 to 75%. The left ventricle has hyperdynamic function. Left ventricular endocardial border not optimally defined to evaluate regional wall motion. Left ventricular diastolic parameters are consistent with Grade I diastolic dysfunction (impaired relaxation). 2. Right ventricular systolic function is normal. The right ventricular size  is normal. 3. The mitral valve is grossly normal. Trivial mitral valve regurgitation. No evidence of mitral stenosis. 4. The aortic valve is tricuspid. There is mild calcification of the aortic valve. There is mild thickening of the aortic valve. Aortic valve regurgitation is not visualized. Mild to moderate aortic valve sclerosis/calcification is present, without any evidence of aortic stenosis. 5. The inferior vena cava is normal in size with greater than 50% respiratory variability, suggesting right atrial pressure of 3 mmHg.   Left heart catheretization on 07/13/20: 1. Multivessel coronary artery and bypass graft disease, as detailed in Dr. Donivan Scull diagnostic catheterization report.  Culprit lesion appears to be 95% stenosis in SVG-RCA. 2. Successful PCI to SVG-RCA using Resolute Onyx 3.5 x 18 mm drug-eluting stent with 0% residual stenosis and TIMI-3 flow.  Recommendations:  1. Remove right femoral artery sheath 2 hours after discontinuation of bivalirudin. 2. Restart heparin 8 hours after sheath removal.  Transition to rivaroxaban tomorrow if no evidence of bleeding/vascular complication. 3. Continue clopidogrel for 12 months (defer aspirin in the setting of aspirin allergy on chronic anticoagulation with rivaroxaban). 4. Aggressive secondary prevention.    Laboratory Data:  High Sensitivity Troponin:   Recent Labs  Lab 09/17/20 2158 09/18/20 0135  TROPONINIHS 10 8     Chemistry Recent Labs  Lab 09/17/20 1450 09/18/20 0135  NA 131* 133*  K 3.0* 3.3*  CL 94* 98  CO2 23 20*  GLUCOSE 162* 126*  BUN 16 18  CREATININE 1.20 1.40*  CALCIUM 9.6 8.9  GFRNONAA >60 52*  ANIONGAP 14 15    Recent Labs  Lab 09/18/20 0328  PROT 7.0  ALBUMIN 3.5  AST 20  ALT 23  ALKPHOS 69  BILITOT 2.8*   Hematology Recent Labs  Lab 09/17/20 1450 09/18/20 0135  WBC 15.9* 15.0*  RBC 6.04* 5.17  HGB 20.1* 17.1*  HCT 55.8* 48.1  MCV 92.4 93.0  MCH 33.3 33.1  MCHC 36.0 35.6   RDW 12.9 13.0  PLT 247 206   BNPNo results for input(s): BNP, PROBNP in the last 168 hours.  DDimer No results for input(s): DDIMER in the last 168 hours.   Radiology/Studies:  DG Chest 1 View  Result Date: 09/17/2020 CLINICAL DATA:  Fall, right shoulder pain. EXAM: CHEST  1 VIEW COMPARISON:  None. FINDINGS: Prior CABG. Heart and mediastinal contours are within normal limits. No focal opacities or effusions. No acute bony abnormality. No visible displaced rib fracture or pneumothorax. IMPRESSION: No active disease. Electronically Signed   By: Rolm Baptise M.D.   On: 09/17/2020 18:02   DG Lumbar Spine 2-3 Views  Result Date: 09/17/2020 CLINICAL DATA:  Back pain, fall EXAM: LUMBAR SPINE - 2-3 VIEW COMPARISON:  None. FINDINGS: Partial fusion across the L3-4, L4-5 and L5-S1 disc spaces. Normal alignment. Moderate to advanced degenerative disc and facet disease diffusely. No fracture. IMPRESSION: Diffuse degenerative disc and facet disease. No acute bony abnormality. Electronically Signed   By: Rolm Baptise M.D.   On: 09/17/2020 18:01   DG Pelvis 1-2 Views  Result Date: 09/17/2020 CLINICAL DATA:  Fall, low back pain radiating to right side. EXAM: PELVIS - 1-2 VIEW COMPARISON:  None. FINDINGS: Symmetric degenerative changes in the hips. Degenerative changes in the visualized lower lumbar spine. No acute bony abnormality. Specifically, no fracture, subluxation, or dislocation. IMPRESSION: No acute bony abnormality. Electronically Signed   By: Rolm Baptise M.D.   On: 09/17/2020 18:00   DG Shoulder Right  Result Date: 09/17/2020 CLINICAL DATA:  Right shoulder pain EXAM: RIGHT SHOULDER - 2+ VIEW COMPARISON:  None. FINDINGS: There is no evidence of fracture or dislocation. There is diffuse osteopenia. Moderate AC and glenohumeral joint osteoarthritis is seen with joint space loss. Mild overlying soft tissue swelling is seen. IMPRESSION: No acute osseous abnormality. Electronically Signed   By: Prudencio Pair  M.D.   On: 09/17/2020 18:00   CT HEAD WO CONTRAST  Result Date: 09/18/2020 CLINICAL DATA:  Syncope EXAM: CT HEAD WITHOUT CONTRAST TECHNIQUE: Contiguous axial images were obtained from the base of the skull through the vertex without intravenous contrast. COMPARISON:  08/07/2020 FINDINGS: Brain: No acute intracranial abnormality. Specifically, no hemorrhage, hydrocephalus, mass lesion, acute infarction, or significant intracranial injury. Vascular: No hyperdense vessel or unexpected calcification. Skull: No acute calvarial abnormality. Sinuses/Orbits: No acute findings Other: None IMPRESSION: No acute intracranial abnormality. Electronically Signed   By: Rolm Baptise M.D.   On: 09/18/2020 00:31   DG Humerus Right  Result Date: 09/17/2020 CLINICAL DATA:  Fall, right shoulder pain EXAM: RIGHT HUMERUS - 2+ VIEW COMPARISON:  None. FINDINGS: Moderate to advanced degenerative changes in the right [facility] joint with joint space narrowing and spurring. Early degenerative changes in the glenohumeral joint. No acute bony abnormality. Specifically, no fracture, subluxation, or dislocation. IMPRESSION: Degenerative changes in the right shoulder. No acute bony abnormality. Electronically Signed   By: Rolm Baptise M.D.   On: 09/17/2020 18:01     Assessment and Plan:   Syncope and collapse  CAD with hx of CABG 2006 and PCI 06/2020  - patient presented with 2nd episode of syncope similar to March 2022 hospitalization, after having intractable nausea with emesis, diarrhea, and poor PO intake over the past 2 month. History reveals no chest pain or ACS equivalent symptoms.  Hs Trop negative x2. EKG showed sinus rhythm with first degree AV block, ventricular rate of 69bpm, and non-specific T wave abnormalities when comparing to EKG from 09/03/20. Telemetry without acute arrhythmia.  Echo from  09/04/20 with EF 70-75%, grade I DD. Suspected clinical presentation is due to orthostasis /dehydration +/- micturition, low suspicion for  ischemic process induced syncope, although he is high risk  - recommend repeat and serial orthostatic VS, query accuracy of first orthostatic VS recording  - recommend continue telemetry monitor, serial EKG  - recommend aggressive IVF hydration, monitor urine output  - recommend optimize electrolyte deficiency with goal of K >4 and Mag >2  - recommend continue GDMT with Coreg, Plavix, Statin, Zetia - will hold Imdur meanwhile given pre-load dependent status   AKI, presumed pre-renal etiology  - history, labs, and clinical exam suggest significant hypovolemia  - Cr 1.4 POA, was 1 from last discharge on 09/06/20  - recommend aggressive IVF hydration, monitor strict urine output  - avoid nephrotoxic agents and hypotension  - hold metformin   Hypokalemia Hypomagnesemia - repleted for Mag with 2g IV now   Nausea, vomiting, diarrhea, abdominal pain  Indirect hyperbilirubinemia - ongoing GI symptoms for 2 months without improvement - recommend re-consult GI, agree with EGD and maligancy evaluation  - recommend rule out infectious etiology given concurrent leukocytosis  - management per primary team   Paroxsymal A fib - currently in NSR - CHADSVAS score 5 (age, HTN, CAD, DM) - continue coreg, Ok to hold xarelto before invasive procedure   Type 2 DM - Hgb A1C 6.9%, fairly controlled at goal  - hold metformin, agree with insulin SSI in house   HLD - LDL 29 on 08/08/20 , at goal - continue statin and zetia    Risk Assessment/Risk Scores:  {  CHA2DS2-VASc Score = 5  This indicates a 7.2% annual risk of stroke. The patient's score is based upon: CHF History: No HTN History: Yes Diabetes History: Yes Stroke History: No Vascular Disease History: Yes Age Score: 2 Gender Score: 0    For questions or updates, please contact [place] Please consult [email or web address] for contact info under    Signed, Margie Billet, NP  09/18/2020 5:16 PM   Patient seen and examined Margie Billet, NP  and agree with above.  In brief, the patient is a 75 y.o. male with a PMH of CAD s/p CABG in 2006 and PCI with DES to [place] on 07/13/2020, Paroxsymal A fib ( on Xarelto), type 2 DM, hypertension,  hyperlipidemia, carotid artery stenosis, cervical spinal stenosis s/p C5-C7 ACDF, chronic pain syndrome, hx of tobacco use, and frequent admissions for syncope in the setting of profound dehydration due to nausea/vomiting/diarrhea who returns to the ER with syncope for which Cardiology has been consulted.   Patient has had recent admissions for syncope in the setting of nausea, vomiting and poor PO intake. Each episode was thought to be secondary to dehydration and orthostasis. He has had continued abdominal symptoms with very poor PO intake with episode of recurrent syncope while getting up to urinate after several hours of vomiting. No preceding chest pain or palpitations. Suspect his recurrent syncopal episode is secondary to orthostasis and dehydration again with low suspicion for ACS or arrhythmia. Trop negative. ECG with nonspecific ST-T wave changes but no acute ischemia. Agree with GI work-up for ongoing nausea, vomiting and weight loss as well as fluid hydration.   GEN: Comfortable, multiple scattered ecchymosis on face and extremities Neck: No JVD Cardiac: RRR, no murmurs, rubs, or gallops.  Respiratory: Clear to auscultation bilaterally. GI: Soft, nontender, non-distended  MS: No edema; No deformity. Left arm wrapped in dressing. Multiple ecchymoses. Neuro:  Nonfocal  Psych:  Normal affect   Plan: -Suspect syncope secondary to profound dehydration and orthostasis in the setting of nausea, vomiting and poor PO intake -Low suspicion for ACS or arrhythmogenic etiology -Agree with GI work-up -Continue IVF  -Will hold imdur for now; continue BB but can stop if remains orthostatic -Okay to hold xarelto for EGD -Continue plavix -Continue statin and zetia -Continue telemetry  Gwyndolyn Kaufman,  MD

## 2020-09-18 NOTE — Progress Notes (Signed)
CHART MERGE HISTORY Patient has been seen at the [facility], at [facility] and now at [facility]. Within the [facility] system he has two MRN  Bryan Scott, Bryan Scott [id number]  and  MRN: [id number]    I have summarized his PMH from both MRN and from t he [place] notes  As below with direct quotes from various specialists when possible. His fragmented care and his own lack of understanding of his medical issues make this complex care much more difficult so I think copy and paste from his other charts are warranted.    GI notes and studies: Notes from admission 3/21-3/24 Gastroenterology: Dr. Marius Ditch: 3/22Patient was found to have isolated indirect hyperbilirubinemia and incidentally noted to have dilated CBD.  There is no evidence of acute pancreatitis, cholangitis.  LFTs have been normal today except for elevated bilirubin.  Even if there is choledocholithiasis, his clinical presentation represents nonobstructive pattern.  Recommend MRCP in order to further characterize intra and extrahepatic bile ducts.  Appreciate cardiology recommendations and please continue Plavix at this time.  Even if patient needs ERCP, we can place a temporary CBD stent which does not need interruption of Plavix.  Given history of paroxysmal A. fib, Xarelto has been recommended to be restarted by cardiology.   3/23:MRCP is unremarkable.  No evidence of choledocholithiasis.  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 The common duct measures maximally 8 mm in the porta hepatis on 16/3 versus 9 mm on the prior exam (when remeasured). IMPRESSION: 1. Minimal improvement in common duct caliber, currently upper normal for age. No obstructive stone or mass. No explanation for elevated  bilirubin. 2.  No acute abdominal process  CT abdomen w contrast 3/21 Hepatobiliary: No focal liver abnormality is seen. No gallstones or gallbladder wall thickening. Mild intra- and extrahepatic biliary dilatation. Common bile duct measures 10 mm in diameter. IMPRESSION: 1. No evidence of pulmonary embolism. No acute intrathoracic process. 2. Mild intra- and extrahepatic biliary dilatation with common bile duct measuring 10 mm in diameter. Correlation with LFTs is recommended. If there is clinical concern for biliary obstruction, consider further evaluation with MRCP or ERCP. 3. Unchanged 11 mm nonobstructive right nephrolithiasis. 4. Aortic Atherosclerosis (ICD10-I70.0).  CARDIOLOGY CONSULT note [facility] Cardiology:  Dr. Saunders Revel consult note 3/22:  " .hospitalized in January with chest pain, abdominal pain, and elevated troponin in 06/2020.  Catheterization at that time showed a high-grade stenosis involving SVG-RCA, which was treated with DES x 1. He was hospitalized again a month ago with syncope, which he attributes to dehydration in the setting of abdominal pain and poor PO intake.  He was found to have evidence of acute pancreatitis and was treated conservatively.  He has continued to have intermittent bouts of abdominal pain every few days, which has led to poor hydration .Marland Kitchen  He is not on aspirin given history of allergy as well as previous anticoagulation for his PAF (he is not on NOAC at this time).  Relevant CV Studies: LHC/PCI (07/13/2020): Moderate ostial left main disease Severe disease of the native left circumflex and RCA, and diagonal branch Vein graft to the PDA with severe focal disease Vein graft to diagonal is occluded ostially Vein graft to OM is patent Successful PCI to SVG to PDA using Resolute Onyx  3.5 x 18 mm DES   TTE (09/09/2019):  1. Left ventricular ejection fraction, by estimation, is 40 to 45%. The  left ventricle has mildly decreased function. The left ventricle  has no  regional wall motion abnormalities. Left ventricular diastolic parameters  are consistent with Grade I  diastolic dysfunction (impaired relaxation).  A/P: Syncope: Most likely due to dehydration in the setting of poor oral intake due to abdominal pain.  No arrhythmia noted on telemetry.  Continue gentle IV hydration.  Check orthostatic VS.  Obtain echocardiogram.  Continue low-dose carvedilol for now.  Consider outpatient cardiac monitor after discharge.   CAD s/p recent NSTEMI: Episodic chest pain is not consistent with prior angina and is most likely GI in nature.  EKG is stable from prior admission and HS-TnI is negative x 3.  Low suspicion for ACS at this time.  Patient is just under 2 months out from PCI to [facility] in the setting of NSTEMI.  Recommend continuation of clopidogrel 75 mg daily.  If invasive procedures are necessary, would favor continuation of clopidogrel during the perioperative period.  Continue carvedilol and atorvastatin.   Abdominal pain: Given elevated bilirubin and dilated CBD, there is concern for biliary obstruction.  My exam is not consistent with this however.  GI has recommended ERCP for further evaluation.  I think Bryan Scott is stable from a cardiac standpoint to undergo endoscopic procedures.  I would favor continuation of clopidogrel during the periprocedural period, if possible.  If clopidogrel must be stopped due to unacceptably high risk of major bleeding, I would favor delaying interruption of DAPT as long as possible (ideally 12 months from NSTEMI in 06/2020 but at least 3 months could be considered).  Given that aspirin cannot be used as single antiplatelet therapy due to allergy, it may be reasonable to bridge Bryan Scott with cangrelor while clopidogrel is washing out.   Hyperlipidemia:  Continue atorvastatin.   Cardiomyopathy: Bryan Scott does not appear volume overloaded.  LVEF moderately reduced by last echo a year ago.  Gentle  hydration.  Continue low-dose carvedilol.  Defer ACEI/ARB given history of angioedema.  Repeat echo.   Paroxysmal atrial fibrillation: Patient was previously on rivaroxaban following PCI in 06/2020.  Discharge summary from last month's admission mentions that rivaroxaban should be continued though it was stopped on the discharge medication reconciliation (no explanation given).  Given a CHADSVASC score of at least 6, I would favor restarting when possible.  Continue low-dose carvedilol.  Consider restarting rivaroxaban 20 mg daily if no plans for invasive procedures during this hospitalization.  [employer] notes from outpatient visit: Notes from [place] hospital outpatient on 08/14/2020 08/14/2020 ADDENDUM STATUS: COMPLETED Kathe Mariner, this is a patient that needs to be seen by Cardiology sooner rather  than later. I see that you saw him in clinic last year in 12/2019 so wanted to  bring it to your attention and get your help in forwarding to the right person.  Since Cardiology saw him last he has had a couple of eventful hospitalizations.   He was admitted for NSTEMI (1/26-07/21/20) with the following cath report: 1. Multivessel coronary artery and bypass graft disease, as detailed in Dr. Donivan Scull diagnostic catheterization report. Culprit lesion appears to be 95% stenosis in SVG-RCA. 2. Successful PCI to SVG-RCA using Resolute Onyx 3.5 x 18 mm drug-eluting stent with 0% residual stenosis and TIMI-3 flow.  Discharged on Plavix alone. He does not take ASA (angioedema, refuses to try  again) but cath notes  states he is on Xarelto, though patient has not been on it  which was confirmed both through him and through our pharmacist on a detailed  med rec.   What complicates this picture more is a second, more recent, hospitalization  from 2/22-2/24 at [facility] for a syncopal episode at home 2/2 pancreatitis.  He has been having unintentional weight loss (~25 pounds in < 1 year) and MRCP   from 06/2020 is concerning for pancreatic cancer. We were trying to schedule an  ERCP for him but unfortunately, GI needs him off of plavix.  The patient's top priority is to get this ERCP done. He understandably wants to  know if he has cancer or not and is willing to stop Plavix for a few days,  knowing the risks, if that's what it takes. Can we get Cardiology's help to 1)  discuss with GI what exactly is the best way to get him through this procedure  and 2) discuss risks with Bryan Scott so that he can make the most informed  decision.  Thanks so much. Adding a couple other members from our team who may be more  familiar with him in case we can offer any additional info.  /es/ Barron Schmid MD, RESIDENT

## 2020-09-18 NOTE — Evaluation (Signed)
Physical Therapy Evaluation Patient Details Name: Bryan Scott MRN: [id number] DOB: 04-01-1946 Today's Date: 09/18/2020   History of Present Illness  Bryan Scott is a 75 y/o male who presented to ED on 09/17/20 with lacerations on his chin and right UE following a fall in his bathroom after a syncopal episode. CT and DG was unremarkable for acute changes. PMH includes HTN, T2DM< A-fib, CAD s/p CABG and s/p coronary stent, HLD, spinal stenosis, tobacco use, and potential intraabdominal cancer/mass.    Clinical Impression  Pt received in bed, stating that "i can't get out of bed" and "when staff tried to roll me earlier it hurt so much I screamed." Pt agreeable to bed-level evaluation. Generally weak in both LE, but able to lift legs off bed and kick out. Education provided on contributing factors to LBP, sleep hygiene, diet, and pain neuroscience education. Pt stating he wants to get better and "I will try to get out of bed next time." Pt would benefit from PT to increase strength, decrease risk for a fall, and increase independency. Pt left with in bed with all needs met, call bell within reach, and bed alarm active. Will continue to follow acutely.    Follow Up Recommendations Home health PT;Other (comment);Supervision for mobility/OOB (Pt declining SNF)    Equipment Recommendations  3in1 (PT);Wheelchair (measurements PT);Wheelchair cushion (measurements PT);Rolling walker with 5" wheels;Hospital bed    Recommendations for Other Services       Precautions / Restrictions Precautions Precautions: Fall Restrictions Weight Bearing Restrictions: No          Pertinent Vitals/Pain Pain Assessment: Faces Faces Pain Scale: Hurts even more Pain Location: LBP Pain Descriptors / Indicators: Constant;Sharp;Shooting Pain Intervention(s): Monitored during session    Home Living Family/patient expects to be discharged to:: Private residence Living Arrangements: Alone Available Help at Discharge:  Family;Available 24 hours/day (sisters) Type of Home: House Home Access: Ramped entrance;Stairs to enter (Getting ramp built) Entrance Stairs-Rails: Can reach both;Left;Right Entrance Stairs-Number of Steps: 8 in front, 4 in back Home Layout: One level Home Equipment: Walker - 4 wheels;Grab bars - tub/shower;Grab bars - toilet      Prior Function Level of Independence: Independent with assistive device(s)         Comments: Lif[employer]/fall alert device, [facility] going to put ramp on porch, uses rollator, fatigues easily, stating had to take sitting break walking across [place] parking lot     Hand Dominance        Extremity/Trunk Assessment   Upper Extremity Assessment Upper Extremity Assessment: Overall WFL for tasks assessed    Lower Extremity Assessment Lower Extremity Assessment: Generalized weakness       Communication   Communication: No difficulties  Cognition Arousal/Alertness: Awake/alert Behavior During Therapy: WFL for tasks assessed/performed Overall Cognitive Status: Within Functional Limits for tasks assessed                                 General Comments: A&Ox4. Tangential answers      General Comments      Exercises     Assessment/Plan    PT Assessment Patient needs continued PT services  PT Problem List Decreased strength;Decreased mobility;Decreased safety awareness;Decreased range of motion;Decreased activity tolerance;Decreased knowledge of use of DME;Decreased balance       PT Treatment Interventions DME instruction;Therapeutic exercise;Gait training;Balance training;Stair training;Neuromuscular re-education;Wheelchair mobility training;Functional mobility training;Therapeutic activities;Patient/family education    PT Goals (Current goals can be found in  the Care Plan section)  Acute Rehab PT Goals Patient Stated Goal: return home, get stomach issues figured out PT Goal Formulation: With patient Time For Goal Achievement:  10/02/20 Potential to Achieve Goals: Good    Frequency Min 3X/week   Barriers to discharge        Co-evaluation               AM-PAC PT "6 Clicks" Mobility  Outcome Measure Help needed turning from your back to your side while in a flat bed without using bedrails?: A Lot Help needed moving from lying on your back to sitting on the side of a flat bed without using bedrails?: Total Help needed moving to and from a bed to a chair (including a wheelchair)?: Total Help needed standing up from a chair using your arms (e.g., wheelchair or bedside chair)?: Total Help needed to walk in hospital room?: Total Help needed climbing 3-5 steps with a railing? : Total 6 Click Score: 7    End of Session   Activity Tolerance: Patient limited by pain Patient left: in bed;with call bell/phone within reach;with bed alarm set Nurse Communication: Mobility status PT Visit Diagnosis: Unsteadiness on feet (R26.81);Muscle weakness (generalized) (M62.81);History of falling (Z91.81)    Time:  -      Charges:         Bryan Scott, SPT

## 2020-09-18 NOTE — ED Notes (Signed)
Patient back from CT. Being transported to assigned room at this time.

## 2020-09-18 NOTE — Progress Notes (Signed)
Initial Nutrition Assessment  DOCUMENTATION CODES:   Non-severe (moderate) malnutrition in context of acute illness/injury  INTERVENTION:    Ensure Enlive po TID, each supplement provides 350 kcal and 20 grams of protein  NUTRITION DIAGNOSIS:   Moderate Malnutrition related to acute illness (acute GI issues causing vomiting, work-up ongoing) as evidenced by mild fat depletion,mild muscle depletion,moderate muscle depletion.  GOAL:   Patient will meet greater than or equal to 90% of their needs  MONITOR:   PO intake,Supplement acceptance,Labs  REASON FOR ASSESSMENT:   Malnutrition Screening Tool    ASSESSMENT:   75 yo male admitted S/P syncope episode. PMH includes HTN, DM-2, A fib, CAD, HLD, spinal stenosis, tobacco use.   Syncope suspected to be related to dehydration from vomiting. Concern [facility]. Needs ERCP.   Patient reports that he has been throwing up everything he eats. He "can't keep anything down." He threw up after breakfast this morning, so he ordered a light lunch today. He usually drinks 1 Ensure supplement per day at home. Agreeable to receive 3 per day here in the hospital. He reports usual weight 238 lbs 1.5 months ago, currently 158 lbs. No usual weights available to confirm this information. He does have mild-moderate depletion of muscle and subcutaneous fat mass, meeting criteria for moderate malnutrition.  Labs reviewed. Na 133, K 3.3, A1C 6.9 CBG: 103-110  Medications reviewed and include novolog SSI, carafate.   NUTRITION - FOCUSED PHYSICAL EXAM:  Flowsheet Row Most Recent Value  Orbital Region Mild depletion  Upper Arm Region Moderate depletion  Thoracic and Lumbar Region Mild depletion  Buccal Region Mild depletion  Temple Region Mild depletion  Clavicle Bone Region Moderate depletion  Clavicle and Acromion Bone Region Mild depletion  Scapular Bone Region Mild depletion  Dorsal Hand Mild depletion  Patellar  Region Moderate depletion  Anterior Thigh Region Moderate depletion  Posterior Calf Region Moderate depletion  Edema (RD Assessment) None  Hair Reviewed  Eyes Reviewed  Mouth Reviewed  Skin Reviewed  Nails Reviewed       Diet Order:   Diet Order            Diet heart healthy/carb modified Room service appropriate? Yes; Fluid consistency: Thin  Diet effective now                 EDUCATION NEEDS:   Not appropriate for education at this time  Skin:  Skin Assessment: Reviewed RN Assessment  Last BM:  4/4  Height:   Ht Readings from Last 1 Encounters:  09/17/20 6' (1.829 m)    Weight:   Wt Readings from Last 1 Encounters:  09/17/20 71.7 kg    Ideal Body Weight:  80.9 kg  BMI:  Body mass index is 21.43 kg/m.  Estimated Nutritional Needs:   Kcal:  2100-2300  Protein:  100-115 gm  Fluid:  >/= 2 L    Lucas Mallow, RD, LDN, CNSC Please refer to Amion for contact information.

## 2020-09-18 NOTE — Progress Notes (Signed)
FPTS Interim Progress Note  S:Reported to re-evaluate patient for abdominal pain, back pain and nausea. Patient reports improved pain level after   O: BP 115/64 (BP Location: Left Arm)   Pulse 66   Temp 97.8 F (36.6 C) (Oral)   Resp 19   Ht 6' (1.829 m)   Wt 71.7 kg   SpO2 98%   BMI 21.43 kg/m    PE  General: male, more comfortable appearing that prior exam, small moving  Cardio: Normal S1 and S2, no S3 or S4. Rhythm is regular. Pulm:normal respiratory effort, on RA Abdomen: Bowel sounds normal throughout all four quadrants, Abdomen soft, diffusely tender  Back:      A/P:  Abdominal Pain  back pain  Nausea  - continue oxy 10mg  q6 PRN  - will plan for abdominal and pelvic CT with contrast as long as Cr has improved, consider CT without contrast if Cr increased from 1.4  (concern for possible retroperitoneal hematoma given pain level, previously on Xarelto and recent trauma with ecchymosis) - RFP ordered   - continue Zofran ODT PRN  - monitor vitals   Eulis Foster, MD 09/18/2020, 7:48 PM PGY-2, [facility] [facility] pager [phone]

## 2020-09-18 NOTE — Progress Notes (Addendum)
MD paged regarding pt's orthostatic VS.   Lying: 146/108 (121) Sitting: 154/142 (147) Standing: 206/195 (200)  Patient cont to c/o back pain and dizziness upon sitting and standing. OT at bedside with RN. Assisted pt back to bed at this time.

## 2020-09-18 NOTE — Progress Notes (Signed)
FPTS Interim Progress Note  S: Went to assess patient at bedside along with Dr. Jeannine Kitten, patient comfortable at this time. States that his pain has improved with his current pain regimen. Primarily complaining of back pain from his fall.  O: BP 115/64 (BP Location: Left Arm)   Pulse 66   Temp 97.8 F (36.6 C) (Oral)   Resp 19   Ht 6' (1.829 m)   Wt 71.7 kg   SpO2 98%   BMI 21.43 kg/m   General: Patient sitting upright in bed comfortably, in no acute distress. Resp: normal work of breathing Derm: ecchymosis lateral to right thoracic spine, left UE covered in clean and dry bandages, areas of bruising along right UE Neuro: alert and appropriately conversational Psych: mood appropriate, very pleasant   A/P: -Cr 1.31, will proceed with CTA abdomen/pelvis (order placed) to evaluate for concern of retroperitoneal bleed given recent fall and on xarelto  -continue tylenol 650 mg q6h with oxycodone 10 mg for breakthrough pain   Donney Dice, DO 09/18/2020, 10:00 PM PGY-1, [place] [facility] pager [phone]

## 2020-09-18 NOTE — Progress Notes (Signed)
Arrived to 5C-16 from ED. Report was received from [place], [place]. Cardiac monitor placed and notified CCMD. C/o Pain 9/10 right lower back. Upon assessment, he presents with significant black and blue ecchymosis to rt lower back and generalized all over. Skin tears w/ significant bleeding noted to LFA, (cleansed and changed dressings. Abrasion w/ old bloody drainage/dried under chin from his fall at home. Assessment completed . IV RAC  C/D/I w/ NS @ 125 cc/hr infusing along w/ 1L bolus NS from ED.   See lab results and EMAR. Call light in reach. Continuing to monitor.

## 2020-09-18 NOTE — H&P (Incomplete)
[facility] Admission History and Physical Service Pager: [phone]  Patient name: Bryan Scott Medical record number: [id number] Date of birth: 1946-03-14 Age: 75 y.o. Gender: male  Primary Care Provider: [facility], [facility] Consultants: none Code Status: Full  Preferred Emergency Contact: Hinda Kehr [phone]  Chief Complaint: syncope secondary to fall  Assessment and Plan: Bryan Scott is a 75 y.o. male presenting with syncopal episode. PMH is significant for hypertension, Type 2 DM, atrial fibrillation, CAD, s/p CABG in 2007, s/p coronary stent in 2022, hyperlipidemia, spinal stenosis and history of tobacco use.   Syncope On presentation, patient vitals not concerning with labs notable for Na 131, K 3, glucose 162, leukocytosis of 15.9, troponin flat and UA pending. In the ED, given percocet along with 1L NS bolus and IV zofran 4 mg. CT head w/o contrast unremarkable, no acute intracranial abnormality noted. Shoulder XR demonstrated no acute osseous abnormality. Patient has had history of recurrent falls with 3 falls in the past 2 months.  Most likely orthostatic syncope given this occurred after an episode of vomiting and diarrhea, but vasovagal also considered given it happened during micturition. Cardiogenic also possible given his afib hx, but less likely than other causes listed. Seizure also on the differential, given his fall was unwitnessed, but does not appear to have any post-ictal symptoms and no prior hx of seizures. Admitted for ongoing syncope workup and monitoring.  -Admit to FPTS, attending Dr. Nori Riis -continuous cardiac monitoring  -MIVF: 125 ml/hr NS -pending UA -f/u echo -f/u am CBC and BMP -f/u am EKG -f/u CK and PT-INR -f/u orthostatic vitals -encourage oral intake as tolerated  -up with assistance  -PT/OT eval and treat -ensure safe disposition   Skin abrasions  bruising   Nausea and vomiting Patient reports repeated  episodes of chronic nausea and vomiting for the past few months. States most recent (unknown)medication he was prescribed for this helped for a while to prevent vomiting prior to today's vomiting episode. This issue is currently being worked up by the [place] and pt is unsure whether it is 'an ulcer' or a tumor they are worried about.  We do not have access to his records from the [place].  Has had prior hospitalization for pancreatitis. 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 Following discharge, was instructed that he would need EGD outpatient which has yet to have been performed. Home medications include protonix 40 mg daily and carafate 1g qid daily. -consider GI consult in AM for possible EGD -continue home protonix -continue home carafate   Hypertension Normotensive BP on admission of 121/83. Home medications include coreg 3.125 mg bid daily and imdur 30 mg daily, which patient endorses daily compliance. -continue coreg 3.125 mg bid daily  -continue imdur 30 mg daily -monitor BP  Type 2 DM Blood glucose on admission 162 and most recent A1c 8 on 04/17/2021. Home medications include jardiance 25 mg daily and metformin 500 mg bid daily. -hold home jardiance -hold home metformin -sSSI  -monitor CBG -pending A1c  Paroxysmal atrial fibrillation  EKG notable for sinus rhythm without ST elevations, no a fib noted. On admission not in atrial fibrillation with HR 76.  Home medications include xarelto and coreg. -hold xarelto, consider restarting in the am - continue coreg  Leukocytosis  Elevated Hgb On admission, WBC 15.9, Hgb 20.1 and hematocrit 55.8. Likely secondary to hemoconcentration given dehydration status. Low concern for infectious process  given CXR demonstrated no active disease, maintenance of afebrile status and no obvious clinical sign of infection. -monitor clinically -am CBC -pending  UA  Electrolyte abnormalities Na 131 and K 3.0 on admission, likely due to dehydration 2/2 vomiting.  -IV KCl 10 mEq in 100 ml  -monitor am BMP  CAD  s/p stent placement  History of CAD with coronary stent placed on 07/13/2020. Most recent echo in march 2022 notable for EF 70-75% along with Grade 1 diastolic dysfunction. Home meds include plavix 75 mg. -continue home plavix   Hyperlipidemia Most recent lipid panel on 08/08/2020 unremarkable. Home medications include atorvastatin 80 mg daily and zetia 10 mg daily. -continue home statin therapy -continue home zetia  Chronic pain syndrome  Spinal stenosis  Endorsing back pain that limits neurological exam. Reports extensive surgical history include 4 back and 2 neck-related surgeries. Lumbar XR demonstrated diffuse degenerative disc and facet disease without acute bony abnormality. Home medications include gabapentin 100 mg tid daily. Pt states he was on morphine at home , but recently tapered off it.  Given  5mg  percocet x2 in the ED.  -continue home gabapentin -oxycodone 10 mg once   FEN/GI: heart healthy/carb modified diet Prophylaxis: on full dose anticoagulation (xarelto held)  Disposition: admit to med telemetry, attending Dr. Nori Riis  History of Present Illness:  Bryan Scott is a 75 y.o. male presenting with syncopal episode.   Patient reports that he had been experiencing episodes of nausea and vomiting over the past few months and had an episode starting yesterday that included diarrhea as well (usually only vomiting). He went to the bathroom today and had a syncopal episode while urinating. He hit his chin on the countertop. He endorses headache since that time. Also endorses right arm/shoulder pain.  Denies seizure history.  He lives alone and ambulates with assistance of a 4 wheel walker with a seat. He is able to carry out all his daily activities independently. Has been experiencing dyspnea but denies chest pain. Endorsing  dizziness, weakness, light-headedness and some abdominal pain that he thinks is from recent vomiting.   Patient states that his vomiting is being worked up by his pcp at the [place] and thinks he was found to haveeither  an ulcer or possible abdominal malignancy although current workup is still pending outpatient as he was supposed to undergo an EGD on Thursday later this week. Endorsing appetite changes and decreased intake along with hydration. Apparently his nausea and vomiting have been an ongoing issue as he was recently hospitalized for something similar about a month ago. He sees the [place] for his PCP needs.  Reports that he quit smoking 50 years ago and has not used alcohol in 15 years. Denies illicit drug use.   Review Of Systems: Per HPI with the following additions:   Review of Systems  Constitutional: Positive for appetite change. Negative for fever.  Eyes: Negative for visual disturbance.  Respiratory: Positive for shortness of breath.   Cardiovascular: Negative for chest pain.  Gastrointestinal: Positive for diarrhea, nausea and vomiting. Negative for abdominal pain and blood in stool.  Musculoskeletal: Positive for back pain.  Neurological: Positive for dizziness, syncope, weakness, light-headedness and headaches.     Patient Active Problem List   Diagnosis Date Noted  . Syncope 09/17/2020    Past Medical History: No past medical history on file.  Past Surgical History: Coronary stent placement 2022 Prior back and neck surgeries   Social History:   Additional social history: Quit  smoking 50 years ago. Denies alcohol and illicit drug use. Please also refer to relevant sections of EMR.  Family History: No family history on file.   Allergies and Medications: Allergies  Allergen Reactions  . Aspirin Swelling  . Lisinopril Anaphylaxis   No current facility-administered medications on file prior to encounter.   No current outpatient medications on file prior to  encounter.    Objective: BP 109/76   Pulse 78   Temp (!) 97.4 F (36.3 C) (Axillary)   Resp 18   Ht 6' (1.829 m)   Wt 71.7 kg   SpO2 96%   BMI 21.43 kg/m  Exam: General: Patient laying in bed comfortably, in no acute distress. Eyes: PERRLA, sclera white ENTM: No head lacerations noted, small laceration on chin without active bleeding Neck: supple neck without evidence of lymphadenopathy  Cardiovascular: RRR, no murmurs or gallops auscultated  Respiratory: CTAB, no wheezing, rales or rhonchi noted Gastrointestinal: soft, mild tenderness more prominently along LUQ, nondistended, presence of active bowel sounds Ext: no LE edema noted bilaterally, radial and distal pulses strong and equal bilaterally, left UE covered in clean dressing with very mild saturation Derm: patches of ecchymosis along UE bilaterally, skin warm and dry to touch Neuro: AOx4, decreased sensation along left LE, 5/5 grip strength, able to move all extremities, LE strength limited by back pain Psych: mood appropriate   Labs and Imaging: CBC BMET  Recent Labs  Lab 09/17/20 1450  WBC 15.9*  HGB 20.1*  HCT 55.8*  PLT 247   Recent Labs  Lab 09/17/20 1450  NA 131*  K 3.0*  CL 94*  CO2 23  BUN 16  CREATININE 1.20  GLUCOSE 162*  CALCIUM 9.6       DG Chest 1 View  Result Date: 09/17/2020 CLINICAL DATA:  Fall, right shoulder pain. EXAM: CHEST  1 VIEW COMPARISON:  None. FINDINGS: Prior CABG. Heart and mediastinal contours are within normal limits. No focal opacities or effusions. No acute bony abnormality. No visible displaced rib fracture or pneumothorax. IMPRESSION: No active disease. Electronically Signed   By: Rolm Baptise M.D.   On: 09/17/2020 18:02   DG Lumbar Spine 2-3 Views  Result Date: 09/17/2020 CLINICAL DATA:  Back pain, fall EXAM: LUMBAR SPINE - 2-3 VIEW COMPARISON:  None. FINDINGS: Partial fusion across the L3-4, L4-5 and L5-S1 disc spaces. Normal alignment. Moderate to advanced degenerative  disc and facet disease diffusely. No fracture. IMPRESSION: Diffuse degenerative disc and facet disease. No acute bony abnormality. Electronically Signed   By: Rolm Baptise M.D.   On: 09/17/2020 18:01   DG Pelvis 1-2 Views  Result Date: 09/17/2020 CLINICAL DATA:  Fall, low back pain radiating to right side. EXAM: PELVIS - 1-2 VIEW COMPARISON:  None. FINDINGS: Symmetric degenerative changes in the hips. Degenerative changes in the visualized lower lumbar spine. No acute bony abnormality. Specifically, no fracture, subluxation, or dislocation. IMPRESSION: No acute bony abnormality. Electronically Signed   By: Rolm Baptise M.D.   On: 09/17/2020 18:00   DG Shoulder Right  Result Date: 09/17/2020 CLINICAL DATA:  Right shoulder pain EXAM: RIGHT SHOULDER - 2+ VIEW COMPARISON:  None. FINDINGS: There is no evidence of fracture or dislocation. There is diffuse osteopenia. Moderate AC and glenohumeral joint osteoarthritis is seen with joint space loss. Mild overlying soft tissue swelling is seen. IMPRESSION: No acute osseous abnormality. Electronically Signed   By: Prudencio Pair M.D.   On: 09/17/2020 18:00   DG Humerus Right  Result Date:  09/17/2020 CLINICAL DATA:  Fall, right shoulder pain EXAM: RIGHT HUMERUS - 2+ VIEW COMPARISON:  None. FINDINGS: Moderate to advanced degenerative changes in the right [facility] joint with joint space narrowing and spurring. Early degenerative changes in the glenohumeral joint. No acute bony abnormality. Specifically, no fracture, subluxation, or dislocation. IMPRESSION: Degenerative changes in the right shoulder. No acute bony abnormality. Electronically Signed   By: Rolm Baptise M.D.   On: 09/17/2020 18:01    Donney Dice, DO 09/17/2020, 10:13 PM PGY-1, [place] Intern pager: [phone], text pages welcome

## 2020-09-18 NOTE — Evaluation (Signed)
Occupational Therapy Evaluation Patient Details Name: Bryan Scott MRN: [id number] DOB: 20-Aug-1945 Today's Date: 09/18/2020    History of Present Illness Bryan Scott is a 75 y/o male who presented to ED on 09/17/20 with lacerations on his chin and right UE following a fall in his bathroom after a syncopal episode. CT and DG was unremarkable for acute changes. PMH includes HTN, T2DM< A-fib, CAD s/p CABG and s/p coronary stent, HLD, spinal stenosis, tobacco use, and potential intraabdominal cancer/mass.   Clinical Impression   Pt is typically Mod I at home with rollator, has 3 sisters that check on him multiple times a day. Today Pt is min A for transfers, set up for feeding and grooming at bed level. Max A for LB ADL today. Limited by elevated BP and HR (RN in room and aware - notified MD) question impact of anxiety on vitals. OT will continue to follow acutely, recommending HHOT at this point and will monitor for progression.  Orthostatic BP: 146/108 - supine (64 HR) 154/142 sitting (141 HR) 206/195 standing (141 HR)    Follow Up Recommendations  Home health OT;Supervision/Assistance - 24 hour (initially)    Equipment Recommendations  3 in 1 bedside commode    Recommendations for Other Services       Precautions / Restrictions Precautions Precautions: Fall Restrictions Weight Bearing Restrictions: No      Mobility Bed Mobility Overal bed mobility: Needs Assistance Bed Mobility: Supine to Sit;Sit to Supine     Supine to sit: Mod assist (trunk elevation) Sit to supine: Min guard   General bed mobility comments: moves quickly, reaches out for assist to come to sitting    Transfers Overall transfer level: Needs assistance Equipment used: 1 person hand held assist Transfers: Sit to/from Stand Sit to Stand: Min assist         General transfer comment: min A for balance and stedy    Balance Overall balance assessment: Needs assistance Sitting-balance support: Single extremity  supported;Feet supported Sitting balance-Leahy Scale: Fair Sitting balance - Comments: min guard - Pt complaining of light headedness   Standing balance support: Bilateral upper extremity supported Standing balance-Leahy Scale: Poor Standing balance comment: dependent on external support                           ADL either performed or assessed with clinical judgement   ADL Overall ADL's : Needs assistance/impaired Eating/Feeding: Set up;Bed level   Grooming: Wash/dry face;Set up;Bed level   Upper Body Bathing: Moderate assistance;Sitting   Lower Body Bathing: Maximal assistance;Bed level   Upper Body Dressing : Set up;Sitting   Lower Body Dressing: Maximal assistance;Bed level Lower Body Dressing Details (indicate cue type and reason): to don socks Toilet Transfer: Minimal assistance   Toileting- Clothing Manipulation and Hygiene: Minimal assistance;Sit to/from stand       Functional mobility during ADLs: Minimal assistance;Rolling walker (steps up the bed) General ADL Comments: limited by elevated BP     Vision         Perception     Praxis      Pertinent Vitals/Pain Pain Assessment: Faces Faces Pain Scale: Hurts little more Pain Location: lower back Pain Descriptors / Indicators: Constant;Grimacing Pain Intervention(s): Monitored during session;Repositioned     Hand Dominance     Extremity/Trunk Assessment Upper Extremity Assessment Upper Extremity Assessment: LUE deficits/detail LUE Deficits / Details: bandages from elbow to wrist LUE Coordination: WNL   Lower Extremity Assessment Lower Extremity  Assessment: Defer to PT evaluation   Cervical / Trunk Assessment Cervical / Trunk Assessment: Other exceptions Cervical / Trunk Exceptions: hx of chronic back pain   Communication Communication Communication: No difficulties   Cognition Arousal/Alertness: Awake/alert Behavior During Therapy: Anxious Overall Cognitive Status: No  family/caregiver present to determine baseline cognitive functioning Area of Impairment: Attention;Safety/judgement;Awareness;Problem solving                   Current Attention Level: Sustained     Safety/Judgement: Decreased awareness of safety Awareness: Emergent Problem Solving: Decreased initiation;Requires verbal cues General Comments: tangential, required cues for problem solving, says things like "I almost passed out but didn't want to tell you"   General Comments       Exercises     Shoulder Instructions      Home Living Family/patient expects to be discharged to:: Private residence Living Arrangements: Alone Available Help at Discharge: Family;Available 24 hours/day (sisters) Type of Home: House Home Access: Ramped entrance;Stairs to enter (Getting ramp built) Entrance Stairs-Number of Steps: 8 in front, 4 in back Entrance Stairs-Rails: Can reach both;Left;Right Home Layout: One level     Bathroom Shower/Tub: Occupational psychologist: Standard     Home Equipment: Environmental consultant - 4 wheels;Grab bars - tub/shower;Grab bars - toilet          Prior Functioning/Environment Level of Independence: Independent with assistive device(s)        Comments: Lifeline/fall alert device, [facility] going to put ramp on porch, uses rollator, fatigues easily, stating had to take sitting break walking across [place] parking lot        OT Problem List: Decreased activity tolerance;Impaired balance (sitting and/or standing);Decreased cognition;Decreased safety awareness;Decreased knowledge of use of DME or AE;Pain      OT Treatment/Interventions: Self-care/ADL training;Energy conservation;DME and/or AE instruction;Therapeutic activities;Patient/family education;Balance training    OT Goals(Current goals can be found in the care plan section) Acute Rehab OT Goals Patient Stated Goal: return home, get stomach issues figured out OT Goal Formulation: With patient Time For Goal  Achievement: 10/02/20 Potential to Achieve Goals: Good ADL Goals Pt Will Perform Grooming: with supervision;standing Pt Will Perform Upper Body Dressing: with modified independence;sitting Pt Will Perform Lower Body Dressing: with min guard assist;sit to/from stand Pt Will Transfer to Toilet: with supervision;ambulating Pt Will Perform Toileting - Clothing Manipulation and hygiene: with supervision;sit to/from stand  OT Frequency: Min 2X/week   Barriers to D/C:            Co-evaluation              AM-PAC OT "6 Clicks" Daily Activity     Outcome Measure Help from another person eating meals?: None Help from another person taking care of personal grooming?: A Little Help from another person toileting, which includes using toliet, bedpan, or urinal?: A Little Help from another person bathing (including washing, rinsing, drying)?: A Little Help from another person to put on and taking off regular upper body clothing?: A Little Help from another person to put on and taking off regular lower body clothing?: A Lot 6 Click Score: 18   End of Session Equipment Utilized During Treatment: Gait belt Nurse Communication: Mobility status  Activity Tolerance: Patient limited by pain;Treatment limited secondary to medical complications (Comment) (BP) Patient left: in bed;with call bell/phone within reach;with bed alarm set  OT Visit Diagnosis: Unsteadiness on feet (R26.81);History of falling (Z91.81);Muscle weakness (generalized) (M62.81);Other symptoms and signs involving cognitive function  Time: 8891-6945 OT Time Calculation (min): 32 min Charges:  OT General Charges $OT Visit: 1 Visit OT Evaluation $OT Eval Moderate Complexity: 1 Mod OT Treatments $Self Care/Home Management : 8-22 mins  Jesse Sans OTR/L Acute Rehabilitation Services Pager: [phone] Office: [place] 09/18/2020, 1:01 PM

## 2020-09-18 NOTE — Progress Notes (Signed)
Contacted MD on call to notify of continuing pain . Rates 8/10 in Rt lower back. Has been unable to sleep since he was admitted here. See new order.

## 2020-09-18 NOTE — Progress Notes (Signed)
Patient cont to c/o generalized pain especially back pain and voiced that he does not want tylenol. Pt shared that he was on morphine 15mg  in the past for chronic back pain. He appears in nos distress resting in bed. Urine sample collected and sent to lab.  Attending MD paged.

## 2020-09-19 ENCOUNTER — Inpatient Hospital Stay (HOSPITAL_COMMUNITY): Payer: Medicare PPO

## 2020-09-19 ENCOUNTER — Encounter (HOSPITAL_COMMUNITY): Payer: Self-pay | Admitting: Family Medicine

## 2020-09-19 DIAGNOSIS — R112 Nausea with vomiting, unspecified: Secondary | ICD-10-CM | POA: Diagnosis not present

## 2020-09-19 DIAGNOSIS — R935 Abnormal findings on diagnostic imaging of other abdominal regions, including retroperitoneum: Secondary | ICD-10-CM | POA: Diagnosis not present

## 2020-09-19 DIAGNOSIS — E440 Moderate protein-calorie malnutrition: Secondary | ICD-10-CM | POA: Insufficient documentation

## 2020-09-19 DIAGNOSIS — R55 Syncope and collapse: Secondary | ICD-10-CM | POA: Diagnosis not present

## 2020-09-19 LAB — CBC WITH DIFFERENTIAL/PLATELET
Abs Immature Granulocytes: 0.02 10*3/uL (ref 0.00–0.07)
Basophils Absolute: 0.1 10*3/uL (ref 0.0–0.1)
Basophils Relative: 1 %
Eosinophils Absolute: 0.2 10*3/uL (ref 0.0–0.5)
Eosinophils Relative: 2 %
HCT: 39.2 % (ref 39.0–52.0)
Hemoglobin: 13.9 g/dL (ref 13.0–17.0)
Immature Granulocytes: 0 %
Lymphocytes Relative: 31 %
Lymphs Abs: 3.1 10*3/uL (ref 0.7–4.0)
MCH: 33.2 pg (ref 26.0–34.0)
MCHC: 35.5 g/dL (ref 30.0–36.0)
MCV: 93.6 fL (ref 80.0–100.0)
Monocytes Absolute: 0.9 10*3/uL (ref 0.1–1.0)
Monocytes Relative: 9 %
Neutro Abs: 5.7 10*3/uL (ref 1.7–7.7)
Neutrophils Relative %: 57 %
Platelets: 174 10*3/uL (ref 150–400)
RBC: 4.19 MIL/uL — ABNORMAL LOW (ref 4.22–5.81)
RDW: 13.1 % (ref 11.5–15.5)
WBC: 9.9 10*3/uL (ref 4.0–10.5)
nRBC: 0 % (ref 0.0–0.2)

## 2020-09-19 LAB — COMPREHENSIVE METABOLIC PANEL
ALT: 16 U/L (ref 0–44)
AST: 16 U/L (ref 15–41)
Albumin: 2.9 g/dL — ABNORMAL LOW (ref 3.5–5.0)
Alkaline Phosphatase: 59 U/L (ref 38–126)
Anion gap: 5 (ref 5–15)
BUN: 19 mg/dL (ref 8–23)
CO2: 26 mmol/L (ref 22–32)
Calcium: 8.3 mg/dL — ABNORMAL LOW (ref 8.9–10.3)
Chloride: 102 mmol/L (ref 98–111)
Creatinine, Ser: 1.13 mg/dL (ref 0.61–1.24)
GFR, Estimated: >60 mL/min (ref >60)
Glucose, Bld: 110 mg/dL — ABNORMAL HIGH (ref 70–99)
Potassium: 3.1 mmol/L — ABNORMAL LOW (ref 3.5–5.1)
Sodium: 133 mmol/L — ABNORMAL LOW (ref 135–145)
Total Bilirubin: 1.8 mg/dL — ABNORMAL HIGH (ref 0.3–1.2)
Total Protein: 5.5 g/dL — ABNORMAL LOW (ref 6.5–8.1)

## 2020-09-19 LAB — GLUCOSE, CAPILLARY
Glucose-Capillary: 115 mg/dL — ABNORMAL HIGH (ref 70–99)
Glucose-Capillary: 144 mg/dL — ABNORMAL HIGH (ref 70–99)
Glucose-Capillary: 228 mg/dL — ABNORMAL HIGH (ref 70–99)
Glucose-Capillary: 132 mg/dL — ABNORMAL HIGH (ref 70–99)

## 2020-09-19 LAB — CBC
HCT: 41.1 % (ref 39.0–52.0)
HCT: 41.8 % (ref 39.0–52.0)
Hemoglobin: 14.5 g/dL (ref 13.0–17.0)
Hemoglobin: 14.6 g/dL (ref 13.0–17.0)
MCH: 33.9 pg (ref 26.0–34.0)
MCH: 33 pg (ref 26.0–34.0)
MCHC: 35.3 g/dL (ref 30.0–36.0)
MCHC: 34.9 g/dL (ref 30.0–36.0)
MCV: 96 fL (ref 80.0–100.0)
MCV: 94.6 fL (ref 80.0–100.0)
Platelets: 172 10*3/uL (ref 150–400)
Platelets: 176 10*3/uL (ref 150–400)
RBC: 4.28 MIL/uL (ref 4.22–5.81)
RBC: 4.42 MIL/uL (ref 4.22–5.81)
RDW: 13.2 % (ref 11.5–15.5)
RDW: 13.1 % (ref 11.5–15.5)
WBC: 9.1 10*3/uL (ref 4.0–10.5)
WBC: 8 10*3/uL (ref 4.0–10.5)
nRBC: 0 % (ref 0.0–0.2)
nRBC: 0 % (ref 0.0–0.2)

## 2020-09-19 LAB — MAGNESIUM: Magnesium: 1.8 mg/dL (ref 1.7–2.4)

## 2020-09-19 IMAGING — DX DG FOREARM 2V*L*
2 series · 2 of 2 positions shown · non-contrast
Comparison: None.

CLINICAL DATA: Pain following recent fall

EXAM:
LEFT FOREARM - 2 VIEW

[x forearm ap left]
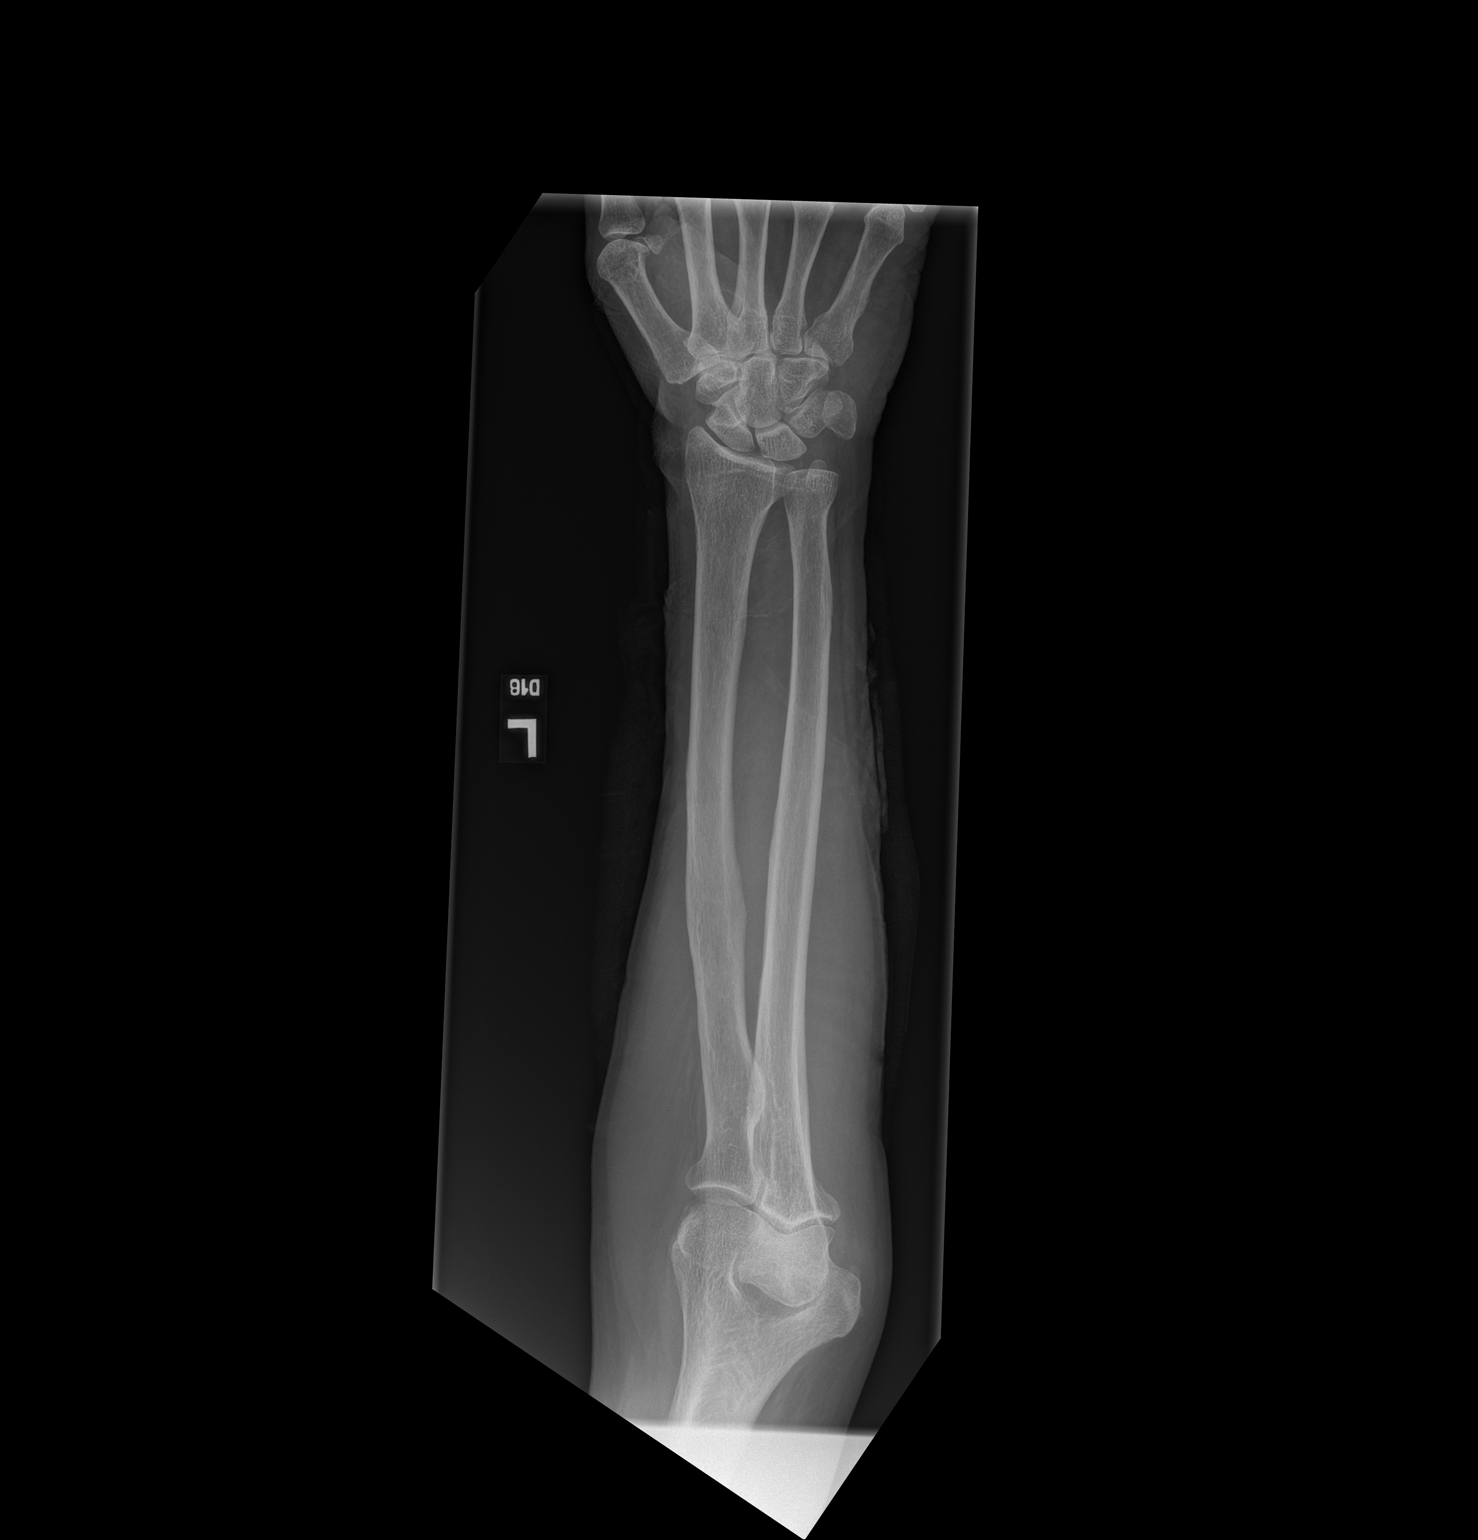

[x forearm lat left]
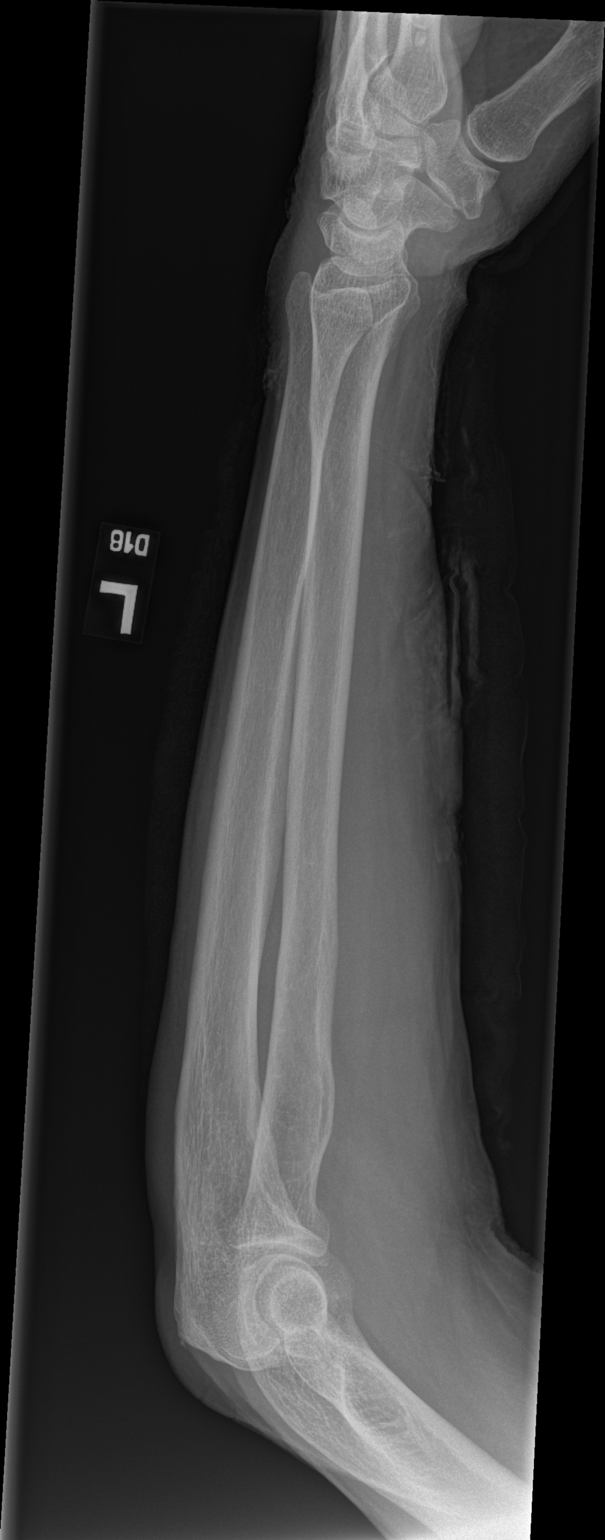

[2 of 2 positions shown; findings below may reference images not displayed]

FINDINGS: Frontal and lateral views were obtained. No appreciable fracture or
dislocation. Joint spaces appear normal. No erosive change.
IMPRESSION: No fracture or dislocation.  No appreciable arthropathy.

## 2020-09-19 IMAGING — DX DG HUMERUS 2V *L*
2 series · 2 of 2 positions shown · non-contrast
Comparison: [DATE]

CLINICAL DATA: Pain following fall

EXAM:
LEFT HUMERUS - 2+ VIEW

[x humerus ap left]
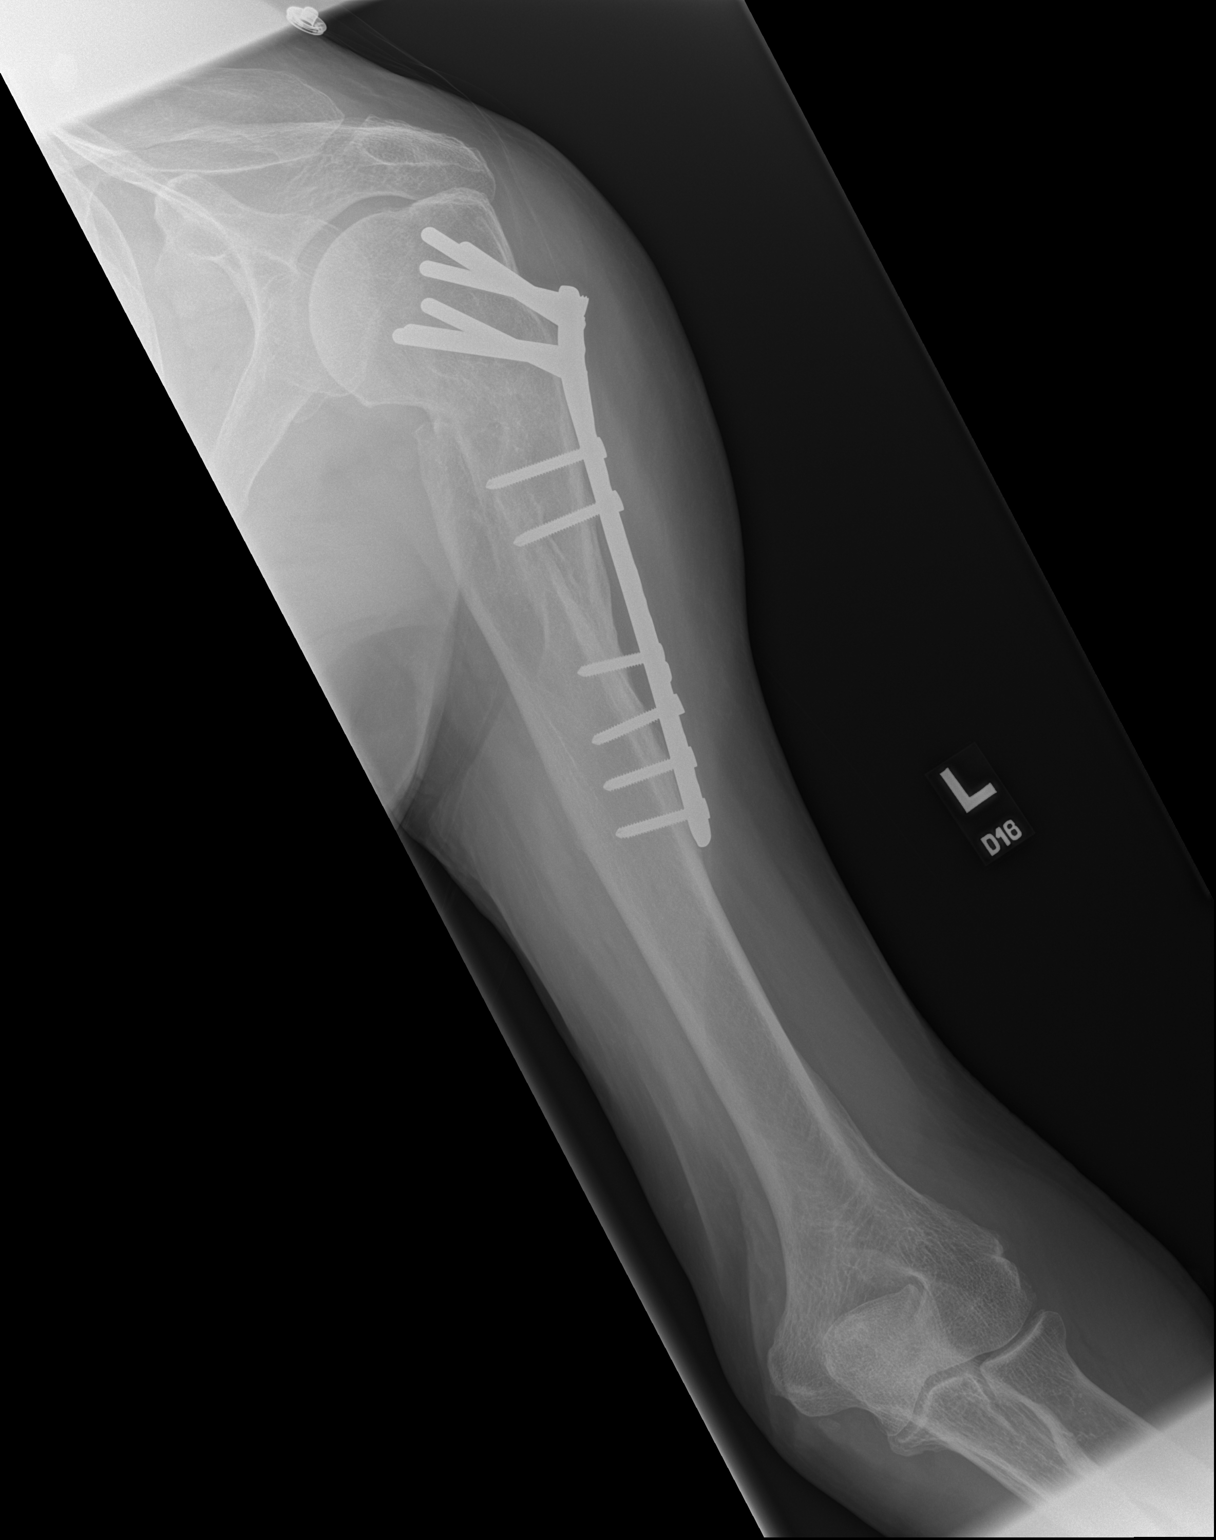

[x humerus lat left]
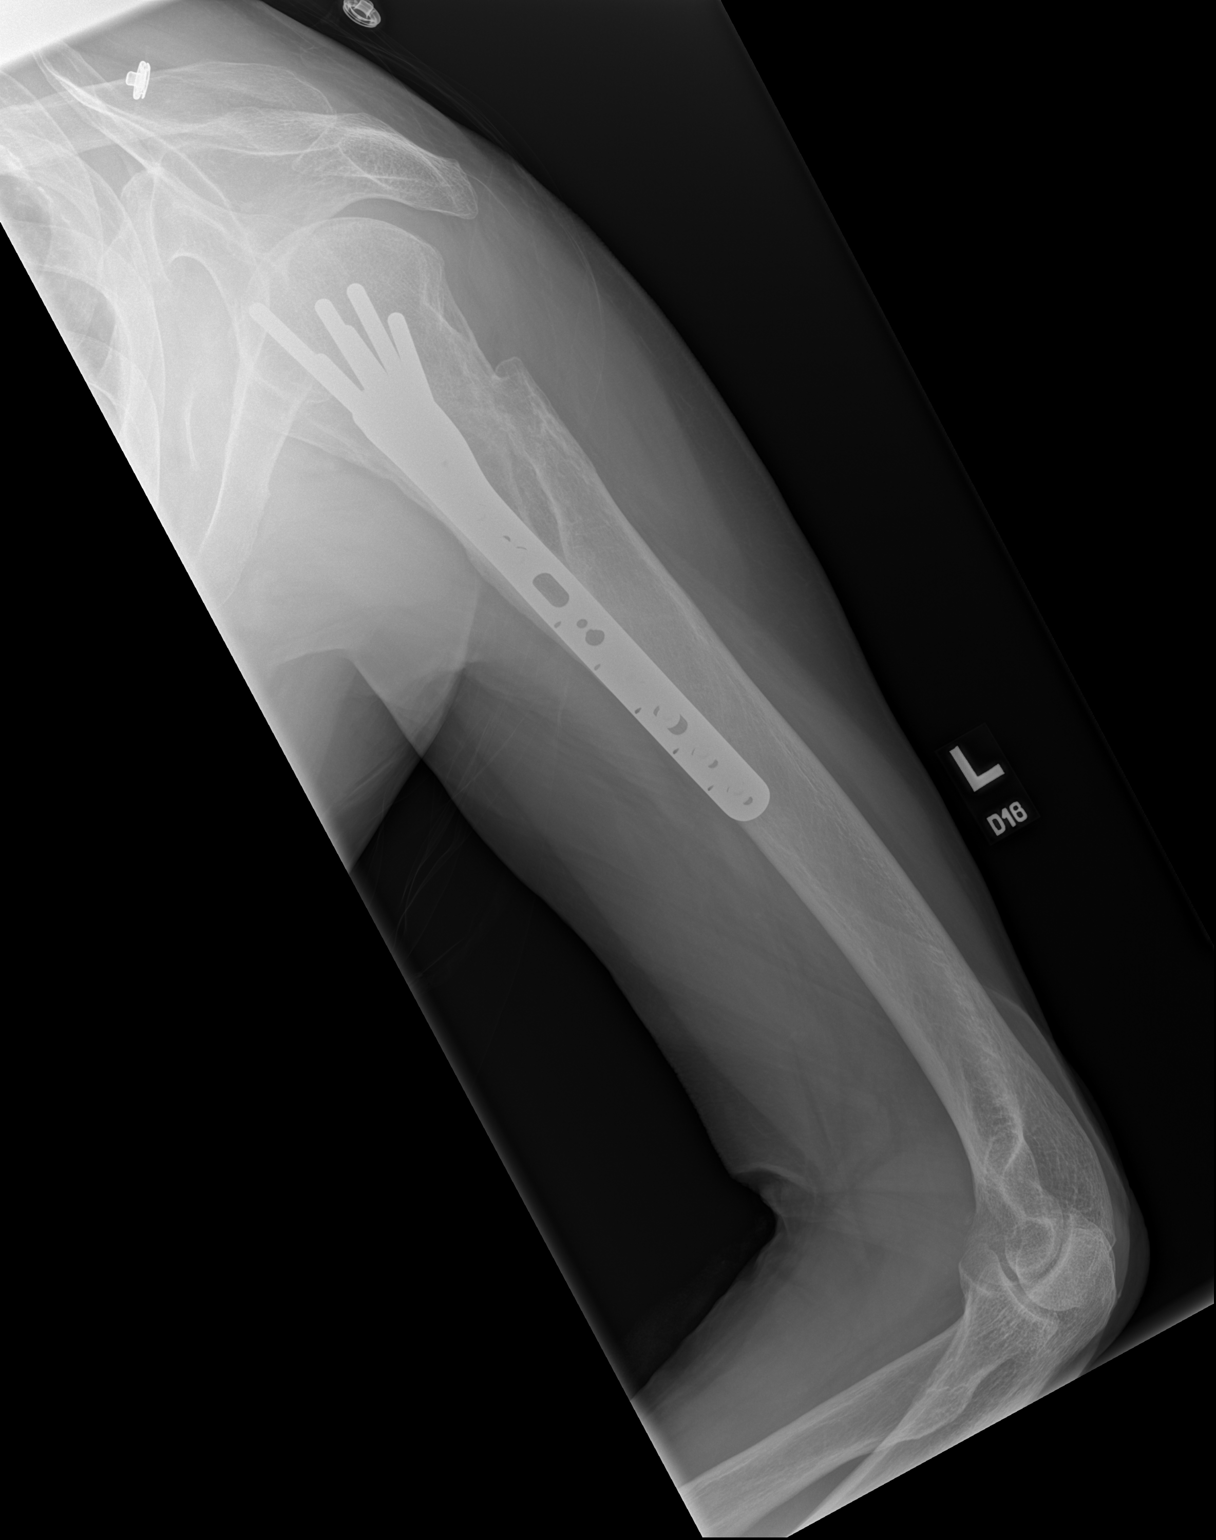

[2 of 2 positions shown; findings below may reference images not displayed]

FINDINGS: Frontal and lateral views were obtained. There is screw and plate
fixation through a previous fracture of the proximal left humerus.
There is remodeling with bony overgrowth in this area. No acute
fracture or dislocation. No appreciable joint space narrowing or
erosion.
IMPRESSION: Postoperative fixation for prior fracture proximal left humerus with
bony overgrowth and remodeling in the proximal left humerus. No
acute fracture or dislocation. No appreciable joint space narrowing
or erosion.

## 2020-09-19 MED ORDER — ENOXAPARIN SODIUM 40 MG/0.4ML ~~LOC~~ SOLN
40.0000 mg | SUBCUTANEOUS | Status: DC
  Administered 2020-09-19 – 2020-09-20 (×2): 40 mg via SUBCUTANEOUS
  Filled 2020-09-19 (×2): qty 0.4

## 2020-09-19 MED ORDER — POTASSIUM CHLORIDE CRYS ER 20 MEQ PO TBCR
40.0000 meq | EXTENDED_RELEASE_TABLET | Freq: Once | ORAL | Status: AC
  Administered 2020-09-19: 40 meq via ORAL
  Filled 2020-09-19: qty 2

## 2020-09-19 MED ORDER — POLYETHYLENE GLYCOL 3350 17 G PO PACK
17.0000 g | PACK | Freq: Every day | ORAL | Status: DC | PRN
  Administered 2020-09-19: 17 g via ORAL
  Filled 2020-09-19: qty 1

## 2020-09-19 MED ORDER — METOCLOPRAMIDE HCL 5 MG PO TABS
10.0000 mg | ORAL_TABLET | Freq: Three times a day (TID) | ORAL | Status: DC
  Administered 2020-09-19 – 2020-09-25 (×24): 10 mg via ORAL
  Filled 2020-09-19 (×25): qty 2

## 2020-09-19 MED ORDER — LIDOCAINE 5 % EX PTCH
1.0000 | MEDICATED_PATCH | CUTANEOUS | Status: DC
  Administered 2020-09-19 – 2020-09-25 (×7): 1 via TRANSDERMAL
  Filled 2020-09-19 (×7): qty 1

## 2020-09-19 MED ORDER — MAGNESIUM SULFATE 2 GM/50ML IV SOLN
2.0000 g | Freq: Once | INTRAVENOUS | Status: AC
  Administered 2020-09-19: 2 g via INTRAVENOUS
  Filled 2020-09-19: qty 50

## 2020-09-19 NOTE — Progress Notes (Addendum)
Progress Note  Patient Name: Bryan Scott Date of Encounter: 09/19/2020  [facility] Cardiologist: Cardiology at [place]   Patient is eating lunch. He states he had a rough night because back and abdominal pain, no longer having vomiting or diarrhea since admission. He was standing earlier and felt increasingly dizzy, no further syncope. He states he was seen by GI team who is planning on scoping him. He wants to know what's wrong with him. He denied any chest pain, heart palpitation, resting SOB, or edema.   Inpatient Medications    Scheduled Meds: . acetaminophen  650 mg Oral Q6H  . atorvastatin  80 mg Oral Daily  . carvedilol  3.125 mg Oral BID WC  . enoxaparin (LOVENOX) injection  40 mg Subcutaneous Q24H  . ezetimibe  10 mg Oral Daily  . feeding supplement  237 mL Oral TID BM  . gabapentin  100 mg Oral TID  . insulin aspart  0-9 Units Subcutaneous TID WC  . lidocaine  1 patch Transdermal Q24H  . metoCLOPramide  10 mg Oral TID AC & HS  . pantoprazole  40 mg Oral Daily  . potassium chloride  40 mEq Oral Once  . sucralfate  1 g Oral QID   Continuous Infusions: . sodium chloride 125 mL/hr at 09/19/20 0921  . magnesium sulfate bolus IVPB     PRN Meds: ondansetron, oxyCODONE   Vital Signs    Vitals:   09/18/20 1200 09/18/20 1602 09/18/20 2302 09/19/20 0457  BP: (!) 146/108 115/64 115/65 133/75  Pulse: 69 66 61 62  Resp: 18 19 18 16   Temp: 98.1 F (36.7 C) 97.8 F (36.6 C) 98 F (36.7 C) 98.3 F (36.8 C)  TempSrc: Oral Oral Oral Oral  SpO2: 98% 98% 99% 96%  Weight:      Height:        Intake/Output Summary (Last 24 hours) at 09/19/2020 1157 Last data filed at 09/19/2020 0916 Gross per 24 hour  Intake 1185.62 ml  Output 3580 ml  Net -2394.38 ml   Last 3 Weights 09/17/2020  Weight (lbs) 158 lb  Weight (kg) 71.668 kg      Telemetry    Telemetry with sinus rhythm, first degree AV block, rate of 60-70s, occasional PAC and PVCs, no acute events -  Personally Reviewed  ECG    Sinus rhythm with ventricular rate of 61 bmp, first degree AV block, non-specific ST-T abnormalities, no acute changes  - Personally Reviewed  Physical Exam   GEN: No acute distress. Fatigued. Oral cavity moist.  Neck: No JVD Cardiac: RRR, no murmurs, rubs, or gallops.  Respiratory: Clear to auscultation bilaterally. GI: Soft, diffuse/generalized abdominal tenderness noted, non-distended.  MS: No edema; No deformity. Neuro:  Nonfocal  Psych: Normal affect  Skin: Left forearm with Kerlix dressing in place. Skin turgor normal.   Labs    High Sensitivity Troponin:   Recent Labs  Lab 09/17/20 2158 09/18/20 0135  TROPONINIHS 10 8      Chemistry Recent Labs  Lab 09/18/20 0135 09/18/20 0328 09/18/20 1943 09/19/20 0118  NA 133*  --  134* 133*  K 3.3*  --  3.5 3.1*  CL 98  --  103 102  CO2 20*  --  25 26  GLUCOSE 126*  --  184* 110*  BUN 18  --  21 19  CREATININE 1.40*  --  1.31* 1.13  CALCIUM 8.9  --  8.2* 8.3*  PROT  --  7.0  --  5.5*  ALBUMIN  --  3.5 2.8* 2.9*  AST  --  20  --  16  ALT  --  23  --  16  ALKPHOS  --  69  --  59  BILITOT  --  2.8*  --  1.8*  GFRNONAA 52*  --  57* >60  ANIONGAP 15  --  6 5     Hematology Recent Labs  Lab 09/17/20 1450 09/18/20 0135 09/19/20 0118  WBC 15.9* 15.0* 9.9  RBC 6.04* 5.17 4.19*  HGB 20.1* 17.1* 13.9  HCT 55.8* 48.1 39.2  MCV 92.4 93.0 93.6  MCH 33.3 33.1 33.2  MCHC 36.0 35.6 35.5  RDW 12.9 13.0 13.1  PLT 247 206 174    BNPNo results for input(s): BNP, PROBNP in the last 168 hours.   DDimer No results for input(s): DDIMER in the last 168 hours.   Radiology    DG Chest 1 View  Result Date: 09/17/2020 CLINICAL DATA:  Fall, right shoulder pain. EXAM: CHEST  1 VIEW COMPARISON:  None. FINDINGS: Prior CABG. Heart and mediastinal contours are within normal limits. No focal opacities or effusions. No acute bony abnormality. No visible displaced rib fracture or pneumothorax. IMPRESSION:  No active disease. Electronically Signed   By: Rolm Baptise M.D.   On: 09/17/2020 18:02   DG Lumbar Spine 2-3 Views  Result Date: 09/17/2020 CLINICAL DATA:  Back pain, fall EXAM: LUMBAR SPINE - 2-3 VIEW COMPARISON:  None. FINDINGS: Partial fusion across the L3-4, L4-5 and L5-S1 disc spaces. Normal alignment. Moderate to advanced degenerative disc and facet disease diffusely. No fracture. IMPRESSION: Diffuse degenerative disc and facet disease. No acute bony abnormality. Electronically Signed   By: Rolm Baptise M.D.   On: 09/17/2020 18:01   DG Pelvis 1-2 Views  Result Date: 09/17/2020 CLINICAL DATA:  Fall, low back pain radiating to right side. EXAM: PELVIS - 1-2 VIEW COMPARISON:  None. FINDINGS: Symmetric degenerative changes in the hips. Degenerative changes in the visualized lower lumbar spine. No acute bony abnormality. Specifically, no fracture, subluxation, or dislocation. IMPRESSION: No acute bony abnormality. Electronically Signed   By: Rolm Baptise M.D.   On: 09/17/2020 18:00   DG Shoulder Right  Result Date: 09/17/2020 CLINICAL DATA:  Right shoulder pain EXAM: RIGHT SHOULDER - 2+ VIEW COMPARISON:  None. FINDINGS: There is no evidence of fracture or dislocation. There is diffuse osteopenia. Moderate AC and glenohumeral joint osteoarthritis is seen with joint space loss. Mild overlying soft tissue swelling is seen. IMPRESSION: No acute osseous abnormality. Electronically Signed   By: Prudencio Pair M.D.   On: 09/17/2020 18:00   CT HEAD WO CONTRAST  Result Date: 09/18/2020 CLINICAL DATA:  Syncope EXAM: CT HEAD WITHOUT CONTRAST TECHNIQUE: Contiguous axial images were obtained from the base of the skull through the vertex without intravenous contrast. COMPARISON:  08/07/2020 FINDINGS: Brain: No acute intracranial abnormality. Specifically, no hemorrhage, hydrocephalus, mass lesion, acute infarction, or significant intracranial injury. Vascular: No hyperdense vessel or unexpected calcification. Skull:  No acute calvarial abnormality. Sinuses/Orbits: No acute findings Other: None IMPRESSION: No acute intracranial abnormality. Electronically Signed   By: Rolm Baptise M.D.   On: 09/18/2020 00:31   DG Humerus Right  Result Date: 09/17/2020 CLINICAL DATA:  Fall, right shoulder pain EXAM: RIGHT HUMERUS - 2+ VIEW COMPARISON:  None. FINDINGS: Moderate to advanced degenerative changes in the right [facility] joint with joint space narrowing and spurring. Early degenerative changes in the glenohumeral joint. No  acute bony abnormality. Specifically, no fracture, subluxation, or dislocation. IMPRESSION: Degenerative changes in the right shoulder. No acute bony abnormality. Electronically Signed   By: Rolm Baptise M.D.   On: 09/17/2020 18:01   CT Angio Abd/Pel w/ and/or w/o  Addendum Date: 09/19/2020   ADDENDUM REPORT: 09/19/2020 01:15 ADDENDUM: There is a contusion involving the patient's right flank. There is a small 0.7 cm hyperattenuating area in the posterior right flank (axial series 5, image 38). This was not visualized on the patient's prior study from September 03, 2020. There is an additional smaller hyperattenuating focus more superiorly measuring approximately 6 mm (axial series 5, image 26). Both of these areas are concerning for areas of active extravasation versus is small pseudoaneurysms in the setting of trauma. Exam is limited by lack of a venous phase. These results were called by telephone at the time of interpretation on 09/19/2020 at 1:14 am to provider Jeannine Kitten , who verbally acknowledged these results. Electronically Signed   By: Constance Holster M.D.   On: 09/19/2020 01:15   Result Date: 09/19/2020 CLINICAL DATA:  Upper GI bleed post endoscopy. EXAM: CTA ABDOMEN AND PELVIS WITHOUT AND WITH CONTRAST TECHNIQUE: Multidetector CT imaging of the abdomen and pelvis was performed using the standard protocol during bolus administration of intravenous contrast. Multiplanar reconstructed images and MIPs were obtained  and reviewed to evaluate the vascular anatomy. CONTRAST:  143mL OMNIPAQUE IOHEXOL 350 MG/ML SOLN COMPARISON:  September 03, 2020 FINDINGS: VASCULAR Aorta: Atherosclerotic changes are noted without evidence for an aneurysm. Celiac: There is variant celiac artery anatomy. The common hepatic artery arises directly from the aorta. There is significant narrowing of the origin of the common hepatic artery. The splenic artery arises directly from the aorta. There is no significant narrowing. SMA: There is mild-to-moderate narrowing involving the SMA. Renals: There is mild narrowing of the left renal artery. There is mild narrowing of the right renal artery. IMA: There is moderate narrowing at the origin of the IMA. Inflow: There is a right common iliac artery stent that is patent. There is mild narrowing of the distal right common iliac artery. Proximal Outflow: Bilateral common femoral and visualized portions of the superficial and profunda femoral arteries are patent without evidence of aneurysm, dissection, vasculitis or significant stenosis. Veins: No obvious venous abnormality within the limitations of this arterial phase study. Review of the MIP images confirms the above findings. NON-VASCULAR Lower chest: There is atelectasis at the lung bases.The heart size is mildly enlarged. There is reflux of contrast into the IVC. Hepatobiliary: The liver is normal. Normal gallbladder.There is no biliary ductal dilation. Pancreas: The pancreatic duct is slightly prominent. Spleen: Unremarkable. Adrenals/Urinary Tract: --Adrenal glands: Unremarkable. --Right kidney/ureter: Again noted is a nonobstructing stone in the right kidney measuring approximately 1.4 cm. There is no hydronephrosis. --Left kidney/ureter: No hydronephrosis or radiopaque kidney stones. --Urinary bladder: Unremarkable. Stomach/Bowel: --Stomach/Duodenum: There is hyperdense material within stomach. --Small bowel: Unremarkable. --Colon: Unremarkable. --Appendix:  Normal. Vascular/Lymphatic: Atherosclerotic calcification is present within the non-aneurysmal abdominal aorta, without hemodynamically significant stenosis. There is mild-to-moderate narrowing of the SMA both in the mid and distal sections. There is mild narrowing of the left renal artery. --No retroperitoneal lymphadenopathy. --No mesenteric lymphadenopathy. --No pelvic or inguinal lymphadenopathy. Reproductive: Unremarkable Other: No ascites or free air. The abdominal wall is normal. Musculoskeletal. There are advanced degenerative changes throughout the lumbar spine. No acute fracture. IMPRESSION: 1. Exam is significantly limited by the lack of a noncontrast phase and a the late venous  phase. 2. While there is no definite active arterial bleeding, there is hyperdense material within the stomach which is nonspecific. This could represent extravasated contrast or hyperdense food material. If there is clinical concern for ongoing GI bleeding, a repeat study should be performed with a noncontrast, arterial, and venous phase. 3. Nonobstructing right-sided nephrolithiasis. 4. Chronic findings as detailed above. 5.  Aortic Atherosclerosis (ICD10-I70.0). Electronically Signed: By: Constance Holster M.D. On: 09/19/2020 00:19    Cardiac Studies   TTE 09/04/20:  1. Left ventricular ejection fraction, by estimation, is 70 to 75%. The left ventricle has hyperdynamic function. Left ventricular endocardial border not optimally defined to evaluate regional wall motion. Left ventricular diastolic parameters are consistent with Grade I diastolic dysfunction (impaired relaxation). 2. Right ventricular systolic function is normal. The right ventricular size is normal. 3. The mitral valve is grossly normal. Trivial mitral valve regurgitation. No evidence of mitral stenosis. 4. The aortic valve is tricuspid. There is mild calcification of the aortic valve. There is mild thickening of the aortic valve. Aortic valve  regurgitation is not visualized. Mild to moderate aortic valve sclerosis/calcification is present, without any evidence of aortic stenosis. 5. The inferior vena cava is normal in size with greater than 50% respiratory variability, suggesting right atrial pressure of 3 mmHg.   Left heart catheretization on 07/13/20: 1. Multivessel coronary artery and bypass graft disease, as detailed in Dr. Donivan Scull diagnostic catheterization report. Culprit lesion appears to be 95% stenosis in SVG-RCA. 2. Successful PCI to SVG-RCA using Resolute Onyx 3.5 x 18 mm drug-eluting stent with 0% residual stenosis and TIMI-3 flow.  Recommendations:  1. Remove right femoral artery sheath 2 hours after discontinuation of bivalirudin. 2. Restart heparin 8 hours after sheath removal. Transition to rivaroxaban tomorrow if no evidence of bleeding/vascular complication. 3. Continue clopidogrel for 12 months (defer aspirin in the setting of aspirin allergy on chronic anticoagulation with rivaroxaban). 4. Aggressive secondary prevention.   Patient Profile     Bryan Scott is a 75 y.o. male with a PMH of CAD s/p CABG in 2006 and PCI with DES to [place] on 07/13/2020, Paroxsymal A fib ( on Xarelto), type 2 DM, hypertension,  hyperlipidemia, carotid artery stenosis, cervical spinal stenosis s/p C5-C7 ACDF, chronic pain syndrome, hx of tobacco use, who presented to the [facility] for syncope and fall at home.  Cardiology is consulted and following for syncope.   Assessment & Plan     Syncope and collapse  CAD with hx of CABG 2006 and PCI 06/2020  SR with first degree AV block  - patient presented with 2nd episode of syncope similar to March 2022 hospitalization, after having persistent intractable nausea with emesis, diarrhea, and poor PO intake over the past 2 month. History reveals no chest pain or ACS equivalent symptoms.  Hs Trop negative x2. EKG showed sinus rhythm with first degree AV block, ventricular rate of 61bpm, and  non-specific T wave abnormalities, no acute changes. Telemetry without acute arrhythmia.  Echo from 09/04/20 with EF 70-75%, grade I DD. Suspected clinical presentation is due to orthostasis /dehydration +/- micturition, low suspicion for ischemic process induced syncope - recommend check orthostatic VS today  - recommend continue telemetry monitor, repeat EKG if needed   - recommend aggressive IVF hydration, monitor urine output  - recommend optimize electrolyte deficiency with goal of K >4 and Mag >2  - recommend continue GDMT with Coreg (if not hypotensive), Plavix, Statin, Zetia - continue hold Imdur meanwhile given pre-load dependent status  AKI, presumed pre-renal etiology  Hyponatremia  - history, labs, and clinical exam suggest significant hypovolemia, POA   - Cr 1.4 POA, was 1 from last discharge on 09/06/20 , now near baseline - recommend continue IVF hydration, monitor strict urine output, UOP improving  - avoid nephrotoxic agents and hypotension  - hold metformin   Hypokalemia Hypomagnesemia - continue replete electrolyte to goal as above , added 2g Mag today.  Nausea, vomiting, diarrhea, abdominal pain  Indirect hyperbilirubinemia - ongoing GI symptoms for 2 months without improvement - leukocytosis resolved today, likely reactive , afebrile  - agree with GI consult  - management per primary team   Paroxsymal A fib - currently in SR - CHADSVAS score 5 (age, HTN, CAD, DM) - continue coreg, Ok to hold xarelto before invasive procedure if planned   Type 2 DM - Hgb A1C 6.9%, fairly controlled at goal  - hold metformin, agree with insulin SSI in house   HLD - LDL 29 on 08/08/20 , at goal - continue statin and zetia   Protein calorie malnutrition, moderate - reports significant weight loss for the past few month  - agree with nutrition consult and protein supplement   Chronic pain syndrome - management per primary team     For questions or updates, please  contact [facility] Please consult [email or web address] for contact info under        Signed, Margie Billet, NP  09/19/2020, 11:57 AM    Patient seen and examined Margie Billet, NP and agree with above.  In brief, the patient is a 75 y.o. male with a PMH of CAD s/p CABG in 2006 and PCI with DES to [place] on 07/13/2020, paroxsymal A fib ( on Xarelto), DMII, HTN,  HLD, carotid artery stenosis, cervical spinal stenosis s/p C5-C7 ACDF, chronic pain syndrome, hx of tobacco use, and frequent admissions for syncope in the setting of  dehydration due to nausea/vomiting/diarrhea who returns to the ER with syncope for which Cardiology has been consulted.   Patient with recurrent syncope in the setting of nausea, vomiting and poor PO intake.  Suspect his recurrent syncopal episode is secondary to orthostasis and dehydration again with low suspicion for ACS or arrhythmia. Trop negative. ECG with nonspecific ST-T wave changes but no acute ischemia. Agree with GI work-up for ongoing nausea, vomiting and weight loss as well as continued fluid hydration.   Orthostatics improved today.   GEN: Comfortable, laying in bed   Neck: No JVD Cardiac: RRR, no murmurs, rubs, or gallops.  Respiratory: Clear to auscultation bilaterally. GI: Soft, nontender, non-distended  MS: No edema; No deformity. Left arm wrapped in dressing. Multiple ecchymoses. Neuro:  Nonfocal  Psych: Normal affect   Plan: -Plan for EGD tomorrow -Holding plavix for 48 hours but patient is high risk given PCI about 3 months ago; will need to resume as soon as possible after procedure -Cannot tolerate ASA due to allergy; if longer duration off of plavix required, will likely need cangrelor bridge -Okay to hold xarelto for EGD; again if prolonged period needed off xarelto, will need IV heparin bridge -Continue IVF  -Holding imdur; orthostatics improved today -Continue statin and zetia -Continue telemetry  Gwyndolyn Kaufman, MD

## 2020-09-19 NOTE — H&P (View-Only) (Signed)
[facility] Consult: 2:22 PM 09/19/2020  LOS: 2 days    Referring Provider: Dr Nori Riis  Primary Care Physician:  [facility], [facility] Primary Gastroenterologist:  unassigned   Warning this patient has 2 separate medical record numbers.  Information regarding GI issues is in the other medical record  Reason for Consultation: Nausea and vomiting.   HPI: Bryan Scott is a 75 y.o. male.  Past history CAD.  CABG 2006.  PCI/DES 07/13/2020.  On Xarelto for PAF.  DM 2.  Hypertension.  Hyperlipidemia.  Carotid stenosis.  Cervical C-spine stenosis.  Chronic pain.  Tobacco abuse. No previous EGD or colonoscopy.  Has had regular FOBT testing at the [place] where he gets his medical care.  Recurrent hospitalizations at outside hospitals over the past 3 months  first in late January through early February for non-STEMI where the PCI was performed.  second admission 2/22 -2/24 at [facility] with acute pancreatitis lipase 116.  No significant LFT abnormalities., AKI, syncope/hypovolemia. 08/08/20 Ultrasound of the abdomen showed 6.5 mm bile duct, normal liver, normal portal vein, normal gallbladder.  Readmitted to [facility] in 3/21 to 09/06/2020 with abdominal pain, nausea, vomiting, syncope. 09/03/2020 CTAP with contrast.  No PE.  Mild intra and extrahepatic biliary dilatation with 10 mm CBD.  If concern for obstruction suggest MRCP or ERCP Stable right kidney stone GI consult with Dr. Marius Ditch at [facility] who wrote "Isolated elevated bilirubin with dilated CBD with recent history of acute uncomplicated pancreatitis.  No evidence of recurrent acute pancreatitis during this admission.  No evidence of acute cholecystitis.  Isolated elevated bilirubin is unusual for choledocholithiasis.  Therefore, recommend fractionated bilirubin N.p.o. for now, until  evaluated by cardiology Okay with clear liquid diet if patient does not demonstrate worsening of abdominal pain Patient probably had acute gallstone pancreatitis during previous admission Monitor LFTs daily Patient is not demonstrating any signs of ascending cholangitis at this time, therefore okay to hold off antibiotics for now.  Therefore, there is no indication indication for urgent ERCP at this time Patient had NSTEMI with PCI and stent placement on 07/13/2020, currently on Plavix. Therefore, recommend cardiology consultation for preop clearance in order to proceed with ERCP, we would also need recommendation about interruption of Plavix if patient undergoes ERCP" "MRCP unremarkable, no evidence of choledocholithiasis.  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  He was discharged on PPI, sulcralfate, stool softeners.  Returned to a hospital and admitted to [facility] hospital 2 days ago after syncope and fall at home.  He has had persistent nausea, vomiting and for the past few weeks has not been able to keep much in the way of PO down.  Abdominal pain is primarily in the left lower quadrant but when he starts vomiting or dry heaving it spreads throughout the abdomen.  He was prescribed what sounds like Reglan and it helped for a while but stopped being effective  in the last few weeks.  Stools are normally soft or loose but a few days ago became watery.. AKI.  Hypokalemia.  Hypomagnesemia. Weight loss from 238 to 157 pounds over the past 2 months. Hypertensive blood pressure readings.  Patient reports tachycardia, no chest pain. Cardiology involved.  Recommend continuing Plavix and cardiac/antihypertensive meds  CT angio abdomen pelvis yesterday.  Study limited by lack of contrast no  active bleeding.  Hyperdense material in the stomach is nonspecific.  Right nephrolithiasis without obstruction.  Calcification in the abdominal aorta without an aneurysm.  No significant stenosis.  Mild to moderate narrowing of SMA in mid and distal sectors, mild narrowing of left renal artery.  Variant celiac artery anatomy.  Narrowing at the origin of the common hepatic artery.  Normal liver normal gallbladder, bile ducts normal.  Slightly prominent pancreatic duct.   Hb 13.9.  WBCs 15.9>> 9.9. T bili 1.8, alk phos 59, AST/ALT 16/16.  Lipase 28. Potassium 3.1.  Sodium 133.  He has office follow-up with Dr. Marius Ditch set for either tomorrow or next Thursday but he feels like he cannot wait. Quit smoking and quit all alcohol use 15 years ago.    Past Medical History:  Diagnosis Date  . Back injuries   . Bladder cancer ([place])   . Chronic pain   . Hx of CABG   . Hx of cardiac cath     History reviewed. No pertinent surgical history.  Prior to Admission medications   Medication Sig Start Date End Date Taking? Authorizing Provider  atorvastatin (LIPITOR) 80 MG tablet Take 80 mg by mouth daily.   Yes [provider]  carvedilol (COREG) 3.125 MG tablet Take 3.125 mg by mouth 2 (two) times daily. 07/19/20  Yes [provider]  clopidogrel (PLAVIX) 75 MG tablet Take 75 mg by mouth every morning. 07/24/20  Yes [provider]  Dextromethorphan-guaiFENesin (ROBITUSSIN DM PO) Take 5 mLs by mouth every 4 (four) hours as needed.   Yes [provider]  empagliflozin (JARDIANCE) 25 MG TABS tablet Take 25 mg by mouth daily.   Yes [provider]  ezetimibe (ZETIA) 10 MG tablet Take 10 mg by mouth daily.   Yes [provider]  gabapentin (NEURONTIN) 100 MG capsule Take 100 mg by mouth 3 (three) times daily.   Yes [provider]  isosorbide mononitrate (IMDUR) 30 MG 24 hr tablet Take 30 mg by mouth daily.   Yes [provider]  lactulose  (CHRONULAC) 10 GM/15ML solution Take 30 mLs by mouth daily as needed for mild constipation. 09/06/20  Yes [provider]  metFORMIN (GLUCOPHAGE) 500 MG tablet Take 500 mg by mouth 2 (two) times daily with a meal.   Yes [provider]  Multiple Vitamins-Minerals (MULTIVITAMIN WITH MINERALS) tablet Take 1 tablet by mouth daily.   Yes [provider]  nitroGLYCERIN (NITROSTAT) 0.4 MG SL tablet Place 0.4 mg under the tongue every 5 (five) minutes as needed for chest pain. 07/19/20  Yes [provider]  Nutritional Supplements (ENSURE ORIGINAL PO) Take 237 mLs by mouth every evening. strawberry   Yes [provider]  pantoprazole (PROTONIX) 40 MG tablet Take 40 mg by mouth daily. 09/06/20  Yes [provider]  SENNA-PLUS 8.6-50 MG tablet Take 2 tablets by mouth 2 (two) times daily. 09/06/20  Yes [provider]  Sennosides (EX-LAX PO) Take 2 tablets by mouth daily as needed (constipation).   Yes [provider]  sucralfate (CARAFATE) 1 g  tablet Take 1 g by mouth 4 (four) times daily. 09/06/20  Yes [provider]  Tetrahydrozoline HCl (VISINE OP) Place 1 drop into both eyes daily as needed (dry red eyes).   Yes [provider]  XARELTO 20 MG TABS tablet Take 20 mg by mouth daily. 09/06/20  Yes [provider]    Scheduled Meds: . acetaminophen  650 mg Oral Q6H  . atorvastatin  80 mg Oral Daily  . carvedilol  3.125 mg Oral BID WC  . enoxaparin (LOVENOX) injection  40 mg Subcutaneous Q24H  . ezetimibe  10 mg Oral Daily  . feeding supplement  237 mL Oral TID BM  . gabapentin  100 mg Oral TID  . insulin aspart  0-9 Units Subcutaneous TID WC  . lidocaine  1 patch Transdermal Q24H  . metoCLOPramide  10 mg Oral TID AC & HS  . pantoprazole  40 mg Oral Daily  . sucralfate  1 g Oral QID   Infusions: . sodium chloride 125 mL/hr at 09/19/20 0921   PRN Meds: ondansetron, oxyCODONE   Allergies as of  09/17/2020 - Review Complete 09/17/2020  Allergen Reaction Noted  . Aspirin Swelling 09/17/2020  . Lisinopril Anaphylaxis 09/17/2020    History reviewed. No pertinent family history.  Social History   Socioeconomic History  . Marital status: Single    Spouse name: Not on file  . Number of children: Not on file  . Years of education: Not on file  . Highest education level: Not on file  Occupational History  . Occupation: retired  Tobacco Use  . Smoking status: Former Research scientist (life sciences)  . Smokeless tobacco: Never Used  Vaping Use  . Vaping Use: Never used  Substance and Sexual Activity  . Alcohol use: Not Currently  . Drug use: Never  . Sexual activity: Not on file  Other Topics Concern  . Not on file  Social History Narrative  . Not on file   Social Determinants of Health   Financial Resource Strain: Not on file  Food Insecurity: Not on file  Transportation Needs: Not on file  Physical Activity: Not on file  Stress: Not on file  Social Connections: Not on file  Intimate Partner Violence: Not on file    REVIEW OF SYSTEMS: Constitutional: Weakness. ENT:  No nose bleeds Pulm: No shortness of breath or cough. CV:  No palpitations, no LE edema.  No angina GU:  No hematuria, no frequency GI: No dysphagia.  See HPI Heme: No unusual or excessive bleeding or bruising. Transfusions: None. Neuro:  No headaches, no peripheral tingling or numbness.  No seizures.  Syncope just prior to this admission and prior to previous admission. Derm:  No itching, no rash or sores.  Endocrine:  No sweats or chills.  No polyuria or dysuria Immunization: Not queried. Travel:  None beyond local counties in last few months.    PHYSICAL EXAM: Vital signs in last 24 hours: Vitals:   09/19/20 0457 09/19/20 1215  BP: 133/75 113/75  Pulse: 62 68  Resp: 16 16  Temp: 98.3 F (36.8 C) 97.8 F (36.6 C)  SpO2: 96% 93%   Wt Readings from Last 3 Encounters:  09/17/20 71.7 kg    General: Patient  looks exhausted and chronically ill.  He is alert, sitting up in the chair comfortable. Head: No facial asymmetry or swelling.  No signs of head trauma. Eyes: No scleral icterus or conjunctival pallor. Ears: Not hard of hearing Nose: No congestion or discharge Mouth:  Tongue midline.  Mucosa pink, moist, clear.  No teeth Neck: No JVD, no masses, no thyromegaly Lungs: Clear bilaterally.  No labored breathing or cough Heart: RRR.  No MRG.  S1, S2 present Abdomen: Soft.  Diffusely tender without guarding or rebound.  No masses, no HSM, no bruits, hernias.  Caveat was patient was examined while in the bedside chair.  Bowel sounds active..   Rectal: Not performed. Musc/Skeltl: Joint redness, swelling or gross deformity. Extremities: No CCE. Neurologic: Alert.  Appropriate.  Oriented x3.  Moves all 4 limbs without weakness or tremor Skin: Somewhat ashen colored skin but no lesions, telangiectasia or rash. Nodes: No cervical adenopathy Psych: Calm, pleasant, fluid speech.  Good historian.  Intake/Output from previous day: 04/05 0701 - 04/06 0700 In: 2410 [P.O.:840; I.V.:1570] Out: 3380 [Urine:3380] Intake/Output this shift: Total I/O In: -  Out: 800 [Urine:800]  LAB RESULTS: Recent Labs    09/17/20 1450 09/18/20 0135 09/19/20 0118  WBC 15.9* 15.0* 9.9  HGB 20.1* 17.1* 13.9  HCT 55.8* 48.1 39.2  PLT 247 206 174   BMET Lab Results  Component Value Date   NA 133 (L) 09/19/2020   NA 134 (L) 09/18/2020   NA 133 (L) 09/18/2020   K 3.1 (L) 09/19/2020   K 3.5 09/18/2020   K 3.3 (L) 09/18/2020   CL 102 09/19/2020   CL 103 09/18/2020   CL 98 09/18/2020   CO2 26 09/19/2020   CO2 25 09/18/2020   CO2 20 (L) 09/18/2020   GLUCOSE 110 (H) 09/19/2020   GLUCOSE 184 (H) 09/18/2020   GLUCOSE 126 (H) 09/18/2020   BUN 19 09/19/2020   BUN 21 09/18/2020   BUN 18 09/18/2020   CREATININE 1.13 09/19/2020   CREATININE 1.31 (H) 09/18/2020   CREATININE 1.40 (H) 09/18/2020   CALCIUM 8.3 (L)  09/19/2020   CALCIUM 8.2 (L) 09/18/2020   CALCIUM 8.9 09/18/2020   LFT Recent Labs    09/18/20 0328 09/18/20 1943 09/19/20 0118  PROT 7.0  --  5.5*  ALBUMIN 3.5 2.8* 2.9*  AST 20  --  16  ALT 23  --  16  ALKPHOS 69  --  59  BILITOT 2.8*  --  1.8*  BILIDIR 0.3*  --   --   IBILI 2.5*  --   --    PT/INR Lab Results  Component Value Date   INR 2.6 (H) 09/18/2020   INR 3.0 (H) 09/17/2020   INR 2.9 (H) 09/17/2020   Hepatitis Panel No results for input(s): HEPBSAG, HCVAB, HEPAIGM, HEPBIGM in the last 72 hours. C-Diff No components found for: CDIFF Lipase     Component Value Date/Time   LIPASE 28 09/18/2020 0330    Drugs of Abuse  No results found for: LABOPIA, COCAINSCRNUR, LABBENZ, AMPHETMU, THCU, LABBARB   RADIOLOGY STUDIES: DG Chest 1 View  Result Date: 09/17/2020 CLINICAL DATA:  Fall, right shoulder pain. EXAM: CHEST  1 VIEW COMPARISON:  None. FINDINGS: Prior CABG. Heart and mediastinal contours are within normal limits. No focal opacities or effusions. No acute bony abnormality. No visible displaced rib fracture or pneumothorax. IMPRESSION: No active disease. Electronically Signed   By: Rolm Baptise M.D.   On: 09/17/2020 18:02   DG Lumbar Spine 2-3 Views  Result Date: 09/17/2020 CLINICAL DATA:  Back pain, fall EXAM: LUMBAR SPINE - 2-3 VIEW COMPARISON:  None. FINDINGS: Partial fusion across the L3-4, L4-5 and L5-S1 disc spaces. Normal alignment. Moderate to advanced degenerative disc and facet disease diffusely. No  fracture. IMPRESSION: Diffuse degenerative disc and facet disease. No acute bony abnormality. Electronically Signed   By: Rolm Baptise M.D.   On: 09/17/2020 18:01   DG Pelvis 1-2 Views  Result Date: 09/17/2020 CLINICAL DATA:  Fall, low back pain radiating to right side. EXAM: PELVIS - 1-2 VIEW COMPARISON:  None. FINDINGS: Symmetric degenerative changes in the hips. Degenerative changes in the visualized lower lumbar spine. No acute bony abnormality.  Specifically, no fracture, subluxation, or dislocation. IMPRESSION: No acute bony abnormality. Electronically Signed   By: Rolm Baptise M.D.   On: 09/17/2020 18:00   DG Shoulder Right  Result Date: 09/17/2020 CLINICAL DATA:  Right shoulder pain EXAM: RIGHT SHOULDER - 2+ VIEW COMPARISON:  None. FINDINGS: There is no evidence of fracture or dislocation. There is diffuse osteopenia. Moderate AC and glenohumeral joint osteoarthritis is seen with joint space loss. Mild overlying soft tissue swelling is seen. IMPRESSION: No acute osseous abnormality. Electronically Signed   By: Prudencio Pair M.D.   On: 09/17/2020 18:00   DG Forearm Left  Result Date: 09/19/2020 CLINICAL DATA:  Pain following recent fall EXAM: LEFT FOREARM - 2 VIEW COMPARISON:  None. FINDINGS: Frontal and lateral views were obtained. No appreciable fracture or dislocation. Joint spaces appear normal. No erosive change. IMPRESSION: No fracture or dislocation.  No appreciable arthropathy. Electronically Signed   By: Lowella Grip III M.D.   On: 09/19/2020 13:27   CT HEAD WO CONTRAST  Result Date: 09/18/2020 CLINICAL DATA:  Syncope EXAM: CT HEAD WITHOUT CONTRAST TECHNIQUE: Contiguous axial images were obtained from the base of the skull through the vertex without intravenous contrast. COMPARISON:  08/07/2020 FINDINGS: Brain: No acute intracranial abnormality. Specifically, no hemorrhage, hydrocephalus, mass lesion, acute infarction, or significant intracranial injury. Vascular: No hyperdense vessel or unexpected calcification. Skull: No acute calvarial abnormality. Sinuses/Orbits: No acute findings Other: None IMPRESSION: No acute intracranial abnormality. Electronically Signed   By: Rolm Baptise M.D.   On: 09/18/2020 00:31   DG Humerus Left  Result Date: 09/19/2020 CLINICAL DATA:  Pain following fall EXAM: LEFT HUMERUS - 2+ VIEW COMPARISON:  May 03, 2012 FINDINGS: Frontal and lateral views were obtained. There is screw and plate fixation  through a previous fracture of the proximal left humerus. There is remodeling with bony overgrowth in this area. No acute fracture or dislocation. No appreciable joint space narrowing or erosion. IMPRESSION: Postoperative fixation for prior fracture proximal left humerus with bony overgrowth and remodeling in the proximal left humerus. No acute fracture or dislocation. No appreciable joint space narrowing or erosion. Electronically Signed   By: Lowella Grip III M.D.   On: 09/19/2020 13:26   DG Humerus Right  Result Date: 09/17/2020 CLINICAL DATA:  Fall, right shoulder pain EXAM: RIGHT HUMERUS - 2+ VIEW COMPARISON:  None. FINDINGS: Moderate to advanced degenerative changes in the right [facility] joint with joint space narrowing and spurring. Early degenerative changes in the glenohumeral joint. No acute bony abnormality. Specifically, no fracture, subluxation, or dislocation. IMPRESSION: Degenerative changes in the right shoulder. No acute bony abnormality. Electronically Signed   By: Rolm Baptise M.D.   On: 09/17/2020 18:01   CT Angio Abd/Pel w/ and/or w/o  Addendum Date: 09/19/2020   ADDENDUM REPORT: 09/19/2020 01:15 ADDENDUM: There is a contusion involving the patient's right flank. There is a small 0.7 cm hyperattenuating area in the posterior right flank (axial series 5, image 38). This was not visualized on the patient's prior study from September 03, 2020. There is an  additional smaller hyperattenuating focus more superiorly measuring approximately 6 mm (axial series 5, image 26). Both of these areas are concerning for areas of active extravasation versus is small pseudoaneurysms in the setting of trauma. Exam is limited by lack of a venous phase. These results were called by telephone at the time of interpretation on 09/19/2020 at 1:14 am to provider Jeannine Kitten , who verbally acknowledged these results. Electronically Signed   By: Constance Holster M.D.   On: 09/19/2020 01:15   Result Date: 09/19/2020 CLINICAL  DATA:  Upper GI bleed post endoscopy. EXAM: CTA ABDOMEN AND PELVIS WITHOUT AND WITH CONTRAST TECHNIQUE: Multidetector CT imaging of the abdomen and pelvis was performed using the standard protocol during bolus administration of intravenous contrast. Multiplanar reconstructed images and MIPs were obtained and reviewed to evaluate the vascular anatomy. CONTRAST:  147m OMNIPAQUE IOHEXOL 350 MG/ML SOLN COMPARISON:  September 03, 2020 FINDINGS: VASCULAR Aorta: Atherosclerotic changes are noted without evidence for an aneurysm. Celiac: There is variant celiac artery anatomy. The common hepatic artery arises directly from the aorta. There is significant narrowing of the origin of the common hepatic artery. The splenic artery arises directly from the aorta. There is no significant narrowing. SMA: There is mild-to-moderate narrowing involving the SMA. Renals: There is mild narrowing of the left renal artery. There is mild narrowing of the right renal artery. IMA: There is moderate narrowing at the origin of the IMA. Inflow: There is a right common iliac artery stent that is patent. There is mild narrowing of the distal right common iliac artery. Proximal Outflow: Bilateral common femoral and visualized portions of the superficial and profunda femoral arteries are patent without evidence of aneurysm, dissection, vasculitis or significant stenosis. Veins: No obvious venous abnormality within the limitations of this arterial phase study. Review of the MIP images confirms the above findings. NON-VASCULAR Lower chest: There is atelectasis at the lung bases.The heart size is mildly enlarged. There is reflux of contrast into the IVC. Hepatobiliary: The liver is normal. Normal gallbladder.There is no biliary ductal dilation. Pancreas: The pancreatic duct is slightly prominent. Spleen: Unremarkable. Adrenals/Urinary Tract: --Adrenal glands: Unremarkable. --Right kidney/ureter: Again noted is a nonobstructing stone in the right kidney  measuring approximately 1.4 cm. There is no hydronephrosis. --Left kidney/ureter: No hydronephrosis or radiopaque kidney stones. --Urinary bladder: Unremarkable. Stomach/Bowel: --Stomach/Duodenum: There is hyperdense material within stomach. --Small bowel: Unremarkable. --Colon: Unremarkable. --Appendix: Normal. Vascular/Lymphatic: Atherosclerotic calcification is present within the non-aneurysmal abdominal aorta, without hemodynamically significant stenosis. There is mild-to-moderate narrowing of the SMA both in the mid and distal sections. There is mild narrowing of the left renal artery. --No retroperitoneal lymphadenopathy. --No mesenteric lymphadenopathy. --No pelvic or inguinal lymphadenopathy. Reproductive: Unremarkable Other: No ascites or free air. The abdominal wall is normal. Musculoskeletal. There are advanced degenerative changes throughout the lumbar spine. No acute fracture. IMPRESSION: 1. Exam is significantly limited by the lack of a noncontrast phase and a the late venous phase. 2. While there is no definite active arterial bleeding, there is hyperdense material within the stomach which is nonspecific. This could represent extravasated contrast or hyperdense food material. If there is clinical concern for ongoing GI bleeding, a repeat study should be performed with a noncontrast, arterial, and venous phase. 3. Nonobstructing right-sided nephrolithiasis. 4. Chronic findings as detailed above. 5.  Aortic Atherosclerosis (ICD10-I70.0). Electronically Signed: By: CConstance HolsterM.D. On: 09/19/2020 00:19      IMPRESSION:   *   Several weeks and now chronic nausea and vomiting with  more acute diarrhea.  Plan was for outpatient colon and EGD to evaluate but he has not made it to the office follow-up with GI yet in order to schedule these.  *   Chronic Plavix.  Last dose 09/18/2020 in a.m.  Currently receiving every 24 hours subcu Lovenox.    PLAN:     *   EGD certainly would be helpful.   However this would be diagnostic only if done tomorrow or the next day given his recent Plavix would not be able to perform significant interventions..  Dr. Tarri Glenn will see the patient and decide timing of this. Continue daily Protonix.  Continue AC at bedtime Reglan by mouth.  Continue Carafate 4 times daily.  Diet as tolerated but may need to back off on his carb mod/heart healthy diet for clear liquids or full liquid diet which may be easier to tolerate.   Azucena Freed  09/19/2020, 2:22 PM Phone [phone]

## 2020-09-19 NOTE — Consult Note (Signed)
[facility] Consult: 2:22 PM 09/19/2020  LOS: 2 days    Referring Provider: Dr Nori Riis  Primary Care Physician:  [facility], [facility] Primary Gastroenterologist:  unassigned   Warning this patient has 2 separate medical record numbers.  Information regarding GI issues is in the other medical record  Reason for Consultation: Nausea and vomiting.   HPI: Bryan Scott is a 75 y.o. male.  Past history CAD.  CABG 2006.  PCI/DES 07/13/2020.  On Xarelto for PAF.  DM 2.  Hypertension.  Hyperlipidemia.  Carotid stenosis.  Cervical C-spine stenosis.  Chronic pain.  Tobacco abuse. No previous EGD or colonoscopy.  Has had regular FOBT testing at the [place] where he gets his medical care.  Recurrent hospitalizations at outside hospitals over the past 3 months  first in late January through early February for non-STEMI where the PCI was performed.  second admission 2/22 -2/24 at [facility] with acute pancreatitis lipase 116.  No significant LFT abnormalities., AKI, syncope/hypovolemia. 08/08/20 Ultrasound of the abdomen showed 6.5 mm bile duct, normal liver, normal portal vein, normal gallbladder.  Readmitted to [facility] in 3/21 to 09/06/2020 with abdominal pain, nausea, vomiting, syncope. 09/03/2020 CTAP with contrast.  No PE.  Mild intra and extrahepatic biliary dilatation with 10 mm CBD.  If concern for obstruction suggest MRCP or ERCP Stable right kidney stone GI consult with Dr. Marius Ditch at [facility] who wrote "Isolated elevated bilirubin with dilated CBD with recent history of acute uncomplicated pancreatitis.  No evidence of recurrent acute pancreatitis during this admission.  No evidence of acute cholecystitis.  Isolated elevated bilirubin is unusual for choledocholithiasis.  Therefore, recommend fractionated bilirubin N.p.o. for now, until  evaluated by cardiology Okay with clear liquid diet if patient does not demonstrate worsening of abdominal pain Patient probably had acute gallstone pancreatitis during previous admission Monitor LFTs daily Patient is not demonstrating any signs of ascending cholangitis at this time, therefore okay to hold off antibiotics for now.  Therefore, there is no indication indication for urgent ERCP at this time Patient had NSTEMI with PCI and stent placement on 07/13/2020, currently on Plavix. Therefore, recommend cardiology consultation for preop clearance in order to proceed with ERCP, we would also need recommendation about interruption of Plavix if patient undergoes ERCP" "MRCP unremarkable, no evidence of choledocholithiasis.  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  He was discharged on PPI, sulcralfate, stool softeners.  Returned to a hospital and admitted to [facility] hospital 2 days ago after syncope and fall at home.  He has had persistent nausea, vomiting and for the past few weeks has not been able to keep much in the way of PO down.  Abdominal pain is primarily in the left lower quadrant but when he starts vomiting or dry heaving it spreads throughout the abdomen.  He was prescribed what sounds like Reglan and it helped for a while but stopped being effective  in the last few weeks.  Stools are normally soft or loose but a few days ago became watery.. AKI.  Hypokalemia.  Hypomagnesemia. Weight loss from 238 to 157 pounds over the past 2 months. Hypertensive blood pressure readings.  Patient reports tachycardia, no chest pain. Cardiology involved.  Recommend continuing Plavix and cardiac/antihypertensive meds  CT angio abdomen pelvis yesterday.  Study limited by lack of contrast no  active bleeding.  Hyperdense material in the stomach is nonspecific.  Right nephrolithiasis without obstruction.  Calcification in the abdominal aorta without an aneurysm.  No significant stenosis.  Mild to moderate narrowing of SMA in mid and distal sectors, mild narrowing of left renal artery.  Variant celiac artery anatomy.  Narrowing at the origin of the common hepatic artery.  Normal liver normal gallbladder, bile ducts normal.  Slightly prominent pancreatic duct.   Hb 13.9.  WBCs 15.9>> 9.9. T bili 1.8, alk phos 59, AST/ALT 16/16.  Lipase 28. Potassium 3.1.  Sodium 133.  He has office follow-up with Dr. Marius Ditch set for either tomorrow or next Thursday but he feels like he cannot wait. Quit smoking and quit all alcohol use 15 years ago.    Past Medical History:  Diagnosis Date  . Back injuries   . Bladder cancer ([place])   . Chronic pain   . Hx of CABG   . Hx of cardiac cath     History reviewed. No pertinent surgical history.  Prior to Admission medications   Medication Sig Start Date End Date Taking? Authorizing Provider  atorvastatin (LIPITOR) 80 MG tablet Take 80 mg by mouth daily.   Yes [provider]  carvedilol (COREG) 3.125 MG tablet Take 3.125 mg by mouth 2 (two) times daily. 07/19/20  Yes [provider]  clopidogrel (PLAVIX) 75 MG tablet Take 75 mg by mouth every morning. 07/24/20  Yes [provider]  Dextromethorphan-guaiFENesin (ROBITUSSIN DM PO) Take 5 mLs by mouth every 4 (four) hours as needed.   Yes [provider]  empagliflozin (JARDIANCE) 25 MG TABS tablet Take 25 mg by mouth daily.   Yes [provider]  ezetimibe (ZETIA) 10 MG tablet Take 10 mg by mouth daily.   Yes [provider]  gabapentin (NEURONTIN) 100 MG capsule Take 100 mg by mouth 3 (three) times daily.   Yes [provider]  isosorbide mononitrate (IMDUR) 30 MG 24 hr tablet Take 30 mg by mouth daily.   Yes [provider]  lactulose  (CHRONULAC) 10 GM/15ML solution Take 30 mLs by mouth daily as needed for mild constipation. 09/06/20  Yes [provider]  metFORMIN (GLUCOPHAGE) 500 MG tablet Take 500 mg by mouth 2 (two) times daily with a meal.   Yes [provider]  Multiple Vitamins-Minerals (MULTIVITAMIN WITH MINERALS) tablet Take 1 tablet by mouth daily.   Yes [provider]  nitroGLYCERIN (NITROSTAT) 0.4 MG SL tablet Place 0.4 mg under the tongue every 5 (five) minutes as needed for chest pain. 07/19/20  Yes [provider]  Nutritional Supplements (ENSURE ORIGINAL PO) Take 237 mLs by mouth every evening. strawberry   Yes [provider]  pantoprazole (PROTONIX) 40 MG tablet Take 40 mg by mouth daily. 09/06/20  Yes [provider]  SENNA-PLUS 8.6-50 MG tablet Take 2 tablets by mouth 2 (two) times daily. 09/06/20  Yes [provider]  Sennosides (EX-LAX PO) Take 2 tablets by mouth daily as needed (constipation).   Yes [provider]  sucralfate (CARAFATE) 1 g  tablet Take 1 g by mouth 4 (four) times daily. 09/06/20  Yes [provider]  Tetrahydrozoline HCl (VISINE OP) Place 1 drop into both eyes daily as needed (dry red eyes).   Yes [provider]  XARELTO 20 MG TABS tablet Take 20 mg by mouth daily. 09/06/20  Yes [provider]    Scheduled Meds: . acetaminophen  650 mg Oral Q6H  . atorvastatin  80 mg Oral Daily  . carvedilol  3.125 mg Oral BID WC  . enoxaparin (LOVENOX) injection  40 mg Subcutaneous Q24H  . ezetimibe  10 mg Oral Daily  . feeding supplement  237 mL Oral TID BM  . gabapentin  100 mg Oral TID  . insulin aspart  0-9 Units Subcutaneous TID WC  . lidocaine  1 patch Transdermal Q24H  . metoCLOPramide  10 mg Oral TID AC & HS  . pantoprazole  40 mg Oral Daily  . sucralfate  1 g Oral QID   Infusions: . sodium chloride 125 mL/hr at 09/19/20 0921   PRN Meds: ondansetron, oxyCODONE   Allergies as of  09/17/2020 - Review Complete 09/17/2020  Allergen Reaction Noted  . Aspirin Swelling 09/17/2020  . Lisinopril Anaphylaxis 09/17/2020    History reviewed. No pertinent family history.  Social History   Socioeconomic History  . Marital status: Single    Spouse name: Not on file  . Number of children: Not on file  . Years of education: Not on file  . Highest education level: Not on file  Occupational History  . Occupation: retired  Tobacco Use  . Smoking status: Former Research scientist (life sciences)  . Smokeless tobacco: Never Used  Vaping Use  . Vaping Use: Never used  Substance and Sexual Activity  . Alcohol use: Not Currently  . Drug use: Never  . Sexual activity: Not on file  Other Topics Concern  . Not on file  Social History Narrative  . Not on file   Social Determinants of Health   Financial Resource Strain: Not on file  Food Insecurity: Not on file  Transportation Needs: Not on file  Physical Activity: Not on file  Stress: Not on file  Social Connections: Not on file  Intimate Partner Violence: Not on file    REVIEW OF SYSTEMS: Constitutional: Weakness. ENT:  No nose bleeds Pulm: No shortness of breath or cough. CV:  No palpitations, no LE edema.  No angina GU:  No hematuria, no frequency GI: No dysphagia.  See HPI Heme: No unusual or excessive bleeding or bruising. Transfusions: None. Neuro:  No headaches, no peripheral tingling or numbness.  No seizures.  Syncope just prior to this admission and prior to previous admission. Derm:  No itching, no rash or sores.  Endocrine:  No sweats or chills.  No polyuria or dysuria Immunization: Not queried. Travel:  None beyond local counties in last few months.    PHYSICAL EXAM: Vital signs in last 24 hours: Vitals:   09/19/20 0457 09/19/20 1215  BP: 133/75 113/75  Pulse: 62 68  Resp: 16 16  Temp: 98.3 F (36.8 C) 97.8 F (36.6 C)  SpO2: 96% 93%   Wt Readings from Last 3 Encounters:  09/17/20 71.7 kg    General: Patient  looks exhausted and chronically ill.  He is alert, sitting up in the chair comfortable. Head: No facial asymmetry or swelling.  No signs of head trauma. Eyes: No scleral icterus or conjunctival pallor. Ears: Not hard of hearing Nose: No congestion or discharge Mouth:  Tongue midline.  Mucosa pink, moist, clear.  No teeth Neck: No JVD, no masses, no thyromegaly Lungs: Clear bilaterally.  No labored breathing or cough Heart: RRR.  No MRG.  S1, S2 present Abdomen: Soft.  Diffusely tender without guarding or rebound.  No masses, no HSM, no bruits, hernias.  Caveat was patient was examined while in the bedside chair.  Bowel sounds active..   Rectal: Not performed. Musc/Skeltl: Joint redness, swelling or gross deformity. Extremities: No CCE. Neurologic: Alert.  Appropriate.  Oriented x3.  Moves all 4 limbs without weakness or tremor Skin: Somewhat ashen colored skin but no lesions, telangiectasia or rash. Nodes: No cervical adenopathy Psych: Calm, pleasant, fluid speech.  Good historian.  Intake/Output from previous day: 04/05 0701 - 04/06 0700 In: 2410 [P.O.:840; I.V.:1570] Out: 3380 [Urine:3380] Intake/Output this shift: Total I/O In: -  Out: 800 [Urine:800]  LAB RESULTS: Recent Labs    09/17/20 1450 09/18/20 0135 09/19/20 0118  WBC 15.9* 15.0* 9.9  HGB 20.1* 17.1* 13.9  HCT 55.8* 48.1 39.2  PLT 247 206 174   BMET Lab Results  Component Value Date   NA 133 (L) 09/19/2020   NA 134 (L) 09/18/2020   NA 133 (L) 09/18/2020   K 3.1 (L) 09/19/2020   K 3.5 09/18/2020   K 3.3 (L) 09/18/2020   CL 102 09/19/2020   CL 103 09/18/2020   CL 98 09/18/2020   CO2 26 09/19/2020   CO2 25 09/18/2020   CO2 20 (L) 09/18/2020   GLUCOSE 110 (H) 09/19/2020   GLUCOSE 184 (H) 09/18/2020   GLUCOSE 126 (H) 09/18/2020   BUN 19 09/19/2020   BUN 21 09/18/2020   BUN 18 09/18/2020   CREATININE 1.13 09/19/2020   CREATININE 1.31 (H) 09/18/2020   CREATININE 1.40 (H) 09/18/2020   CALCIUM 8.3 (L)  09/19/2020   CALCIUM 8.2 (L) 09/18/2020   CALCIUM 8.9 09/18/2020   LFT Recent Labs    09/18/20 0328 09/18/20 1943 09/19/20 0118  PROT 7.0  --  5.5*  ALBUMIN 3.5 2.8* 2.9*  AST 20  --  16  ALT 23  --  16  ALKPHOS 69  --  59  BILITOT 2.8*  --  1.8*  BILIDIR 0.3*  --   --   IBILI 2.5*  --   --    PT/INR Lab Results  Component Value Date   INR 2.6 (H) 09/18/2020   INR 3.0 (H) 09/17/2020   INR 2.9 (H) 09/17/2020   Hepatitis Panel No results for input(s): HEPBSAG, HCVAB, HEPAIGM, HEPBIGM in the last 72 hours. C-Diff No components found for: CDIFF Lipase     Component Value Date/Time   LIPASE 28 09/18/2020 0330    Drugs of Abuse  No results found for: LABOPIA, COCAINSCRNUR, LABBENZ, AMPHETMU, THCU, LABBARB   RADIOLOGY STUDIES: DG Chest 1 View  Result Date: 09/17/2020 CLINICAL DATA:  Fall, right shoulder pain. EXAM: CHEST  1 VIEW COMPARISON:  None. FINDINGS: Prior CABG. Heart and mediastinal contours are within normal limits. No focal opacities or effusions. No acute bony abnormality. No visible displaced rib fracture or pneumothorax. IMPRESSION: No active disease. Electronically Signed   By: Rolm Baptise M.D.   On: 09/17/2020 18:02   DG Lumbar Spine 2-3 Views  Result Date: 09/17/2020 CLINICAL DATA:  Back pain, fall EXAM: LUMBAR SPINE - 2-3 VIEW COMPARISON:  None. FINDINGS: Partial fusion across the L3-4, L4-5 and L5-S1 disc spaces. Normal alignment. Moderate to advanced degenerative disc and facet disease diffusely. No  fracture. IMPRESSION: Diffuse degenerative disc and facet disease. No acute bony abnormality. Electronically Signed   By: Rolm Baptise M.D.   On: 09/17/2020 18:01   DG Pelvis 1-2 Views  Result Date: 09/17/2020 CLINICAL DATA:  Fall, low back pain radiating to right side. EXAM: PELVIS - 1-2 VIEW COMPARISON:  None. FINDINGS: Symmetric degenerative changes in the hips. Degenerative changes in the visualized lower lumbar spine. No acute bony abnormality.  Specifically, no fracture, subluxation, or dislocation. IMPRESSION: No acute bony abnormality. Electronically Signed   By: Rolm Baptise M.D.   On: 09/17/2020 18:00   DG Shoulder Right  Result Date: 09/17/2020 CLINICAL DATA:  Right shoulder pain EXAM: RIGHT SHOULDER - 2+ VIEW COMPARISON:  None. FINDINGS: There is no evidence of fracture or dislocation. There is diffuse osteopenia. Moderate AC and glenohumeral joint osteoarthritis is seen with joint space loss. Mild overlying soft tissue swelling is seen. IMPRESSION: No acute osseous abnormality. Electronically Signed   By: Prudencio Pair M.D.   On: 09/17/2020 18:00   DG Forearm Left  Result Date: 09/19/2020 CLINICAL DATA:  Pain following recent fall EXAM: LEFT FOREARM - 2 VIEW COMPARISON:  None. FINDINGS: Frontal and lateral views were obtained. No appreciable fracture or dislocation. Joint spaces appear normal. No erosive change. IMPRESSION: No fracture or dislocation.  No appreciable arthropathy. Electronically Signed   By: Lowella Grip III M.D.   On: 09/19/2020 13:27   CT HEAD WO CONTRAST  Result Date: 09/18/2020 CLINICAL DATA:  Syncope EXAM: CT HEAD WITHOUT CONTRAST TECHNIQUE: Contiguous axial images were obtained from the base of the skull through the vertex without intravenous contrast. COMPARISON:  08/07/2020 FINDINGS: Brain: No acute intracranial abnormality. Specifically, no hemorrhage, hydrocephalus, mass lesion, acute infarction, or significant intracranial injury. Vascular: No hyperdense vessel or unexpected calcification. Skull: No acute calvarial abnormality. Sinuses/Orbits: No acute findings Other: None IMPRESSION: No acute intracranial abnormality. Electronically Signed   By: Rolm Baptise M.D.   On: 09/18/2020 00:31   DG Humerus Left  Result Date: 09/19/2020 CLINICAL DATA:  Pain following fall EXAM: LEFT HUMERUS - 2+ VIEW COMPARISON:  May 03, 2012 FINDINGS: Frontal and lateral views were obtained. There is screw and plate fixation  through a previous fracture of the proximal left humerus. There is remodeling with bony overgrowth in this area. No acute fracture or dislocation. No appreciable joint space narrowing or erosion. IMPRESSION: Postoperative fixation for prior fracture proximal left humerus with bony overgrowth and remodeling in the proximal left humerus. No acute fracture or dislocation. No appreciable joint space narrowing or erosion. Electronically Signed   By: Lowella Grip III M.D.   On: 09/19/2020 13:26   DG Humerus Right  Result Date: 09/17/2020 CLINICAL DATA:  Fall, right shoulder pain EXAM: RIGHT HUMERUS - 2+ VIEW COMPARISON:  None. FINDINGS: Moderate to advanced degenerative changes in the right [facility] joint with joint space narrowing and spurring. Early degenerative changes in the glenohumeral joint. No acute bony abnormality. Specifically, no fracture, subluxation, or dislocation. IMPRESSION: Degenerative changes in the right shoulder. No acute bony abnormality. Electronically Signed   By: Rolm Baptise M.D.   On: 09/17/2020 18:01   CT Angio Abd/Pel w/ and/or w/o  Addendum Date: 09/19/2020   ADDENDUM REPORT: 09/19/2020 01:15 ADDENDUM: There is a contusion involving the patient's right flank. There is a small 0.7 cm hyperattenuating area in the posterior right flank (axial series 5, image 38). This was not visualized on the patient's prior study from September 03, 2020. There is an  additional smaller hyperattenuating focus more superiorly measuring approximately 6 mm (axial series 5, image 26). Both of these areas are concerning for areas of active extravasation versus is small pseudoaneurysms in the setting of trauma. Exam is limited by lack of a venous phase. These results were called by telephone at the time of interpretation on 09/19/2020 at 1:14 am to provider Jeannine Kitten , who verbally acknowledged these results. Electronically Signed   By: Constance Holster M.D.   On: 09/19/2020 01:15   Result Date: 09/19/2020 CLINICAL  DATA:  Upper GI bleed post endoscopy. EXAM: CTA ABDOMEN AND PELVIS WITHOUT AND WITH CONTRAST TECHNIQUE: Multidetector CT imaging of the abdomen and pelvis was performed using the standard protocol during bolus administration of intravenous contrast. Multiplanar reconstructed images and MIPs were obtained and reviewed to evaluate the vascular anatomy. CONTRAST:  147m OMNIPAQUE IOHEXOL 350 MG/ML SOLN COMPARISON:  September 03, 2020 FINDINGS: VASCULAR Aorta: Atherosclerotic changes are noted without evidence for an aneurysm. Celiac: There is variant celiac artery anatomy. The common hepatic artery arises directly from the aorta. There is significant narrowing of the origin of the common hepatic artery. The splenic artery arises directly from the aorta. There is no significant narrowing. SMA: There is mild-to-moderate narrowing involving the SMA. Renals: There is mild narrowing of the left renal artery. There is mild narrowing of the right renal artery. IMA: There is moderate narrowing at the origin of the IMA. Inflow: There is a right common iliac artery stent that is patent. There is mild narrowing of the distal right common iliac artery. Proximal Outflow: Bilateral common femoral and visualized portions of the superficial and profunda femoral arteries are patent without evidence of aneurysm, dissection, vasculitis or significant stenosis. Veins: No obvious venous abnormality within the limitations of this arterial phase study. Review of the MIP images confirms the above findings. NON-VASCULAR Lower chest: There is atelectasis at the lung bases.The heart size is mildly enlarged. There is reflux of contrast into the IVC. Hepatobiliary: The liver is normal. Normal gallbladder.There is no biliary ductal dilation. Pancreas: The pancreatic duct is slightly prominent. Spleen: Unremarkable. Adrenals/Urinary Tract: --Adrenal glands: Unremarkable. --Right kidney/ureter: Again noted is a nonobstructing stone in the right kidney  measuring approximately 1.4 cm. There is no hydronephrosis. --Left kidney/ureter: No hydronephrosis or radiopaque kidney stones. --Urinary bladder: Unremarkable. Stomach/Bowel: --Stomach/Duodenum: There is hyperdense material within stomach. --Small bowel: Unremarkable. --Colon: Unremarkable. --Appendix: Normal. Vascular/Lymphatic: Atherosclerotic calcification is present within the non-aneurysmal abdominal aorta, without hemodynamically significant stenosis. There is mild-to-moderate narrowing of the SMA both in the mid and distal sections. There is mild narrowing of the left renal artery. --No retroperitoneal lymphadenopathy. --No mesenteric lymphadenopathy. --No pelvic or inguinal lymphadenopathy. Reproductive: Unremarkable Other: No ascites or free air. The abdominal wall is normal. Musculoskeletal. There are advanced degenerative changes throughout the lumbar spine. No acute fracture. IMPRESSION: 1. Exam is significantly limited by the lack of a noncontrast phase and a the late venous phase. 2. While there is no definite active arterial bleeding, there is hyperdense material within the stomach which is nonspecific. This could represent extravasated contrast or hyperdense food material. If there is clinical concern for ongoing GI bleeding, a repeat study should be performed with a noncontrast, arterial, and venous phase. 3. Nonobstructing right-sided nephrolithiasis. 4. Chronic findings as detailed above. 5.  Aortic Atherosclerosis (ICD10-I70.0). Electronically Signed: By: CConstance HolsterM.D. On: 09/19/2020 00:19      IMPRESSION:   *   Several weeks and now chronic nausea and vomiting with  more acute diarrhea.  Plan was for outpatient colon and EGD to evaluate but he has not made it to the office follow-up with GI yet in order to schedule these.  *   Chronic Plavix.  Last dose 09/18/2020 in a.m.  Currently receiving every 24 hours subcu Lovenox.    PLAN:     *   EGD certainly would be helpful.   However this would be diagnostic only if done tomorrow or the next day given his recent Plavix would not be able to perform significant interventions..  Dr. Tarri Glenn will see the patient and decide timing of this. Continue daily Protonix.  Continue AC at bedtime Reglan by mouth.  Continue Carafate 4 times daily.  Diet as tolerated but may need to back off on his carb mod/heart healthy diet for clear liquids or full liquid diet which may be easier to tolerate.   Azucena Freed  09/19/2020, 2:22 PM Phone [phone]

## 2020-09-19 NOTE — Progress Notes (Signed)
Family Medicine Teaching Service Daily Progress Note Intern Pager: [phone]  Patient name: Bryan Scott Medical record number: [id number] Date of birth: 1945/09/26 Age: 75 y.o. Gender: male  Primary Care Provider: [facility], [facility] Consultants: Cardiology, GI Code Status: Full  Pt Overview and Major Events to Date:  4/4 admitted  Assessment and Plan: [name] is a 75 year old male presenting with nausea, vomiting, syncope of unknown etiology.  PMH includes HTN, T2DM, A. fib, CAD s/p CABG 2007 and NSTEMI s/p coronary stent 2002, HLD, spinal stenosis, history of tobacco use.  Syncope Morning EKG demonstrates sinus rhythm with first-degree AV block, QTC 463.  CBC with differential unremarkable, CMP shows mild hyponatremia, potassium 3.1, albumin 2.9. -Cardiology consulted, appreciate recommendations, currently low suspicion for ACS or arrhythmia genic etiology -Per cardiology, will hold Imdur for now -Per cardiology, can continue BB but okay to stop if remains orthostatic -Per cardiology, okay to hold Xarelto for EGD if needed -GI consulted this morning, appreciate recommendations -Awaiting a.m. magnesium -Awaiting peripheral smear -Continue cardiac monitoring -Continue IVF with NS at 125 mL/h -Encourage p.o. intake as tolerated -PT OT eval and treat -Up with assistance  Nausea and vomiting  Concern for intra-abdominal process Gastroparesis leading diagnosis on differential for now.  Received Zofran ODT 4 mg x 2 overnight.  Received OxyIR 10 mg as needed x3 overnight. -Consult GI this morning, appreciate recommendations -Add Reglan -Zofran ODT every 8 as needed -Protonix 40 mg daily -IV fluids with NS at 125 mils an hour  Protein calorie malnutrition, moderate -Consult nutrition -Continue Ensure 3 times daily between meals  CAD s/p CABG 2007  NSTEMI s/p coronary stent 2022  HFrEF TTE on 3/26 at outside hospital shows LVEF 40 to 35%, grade 1 diastolic dysfunction  (impaired relaxation). -Cardiology on board, appreciate ongoing care -Continuing home Plavix, atorvastatin -Per cardiology, holding Imdur  Skin abrasions  bruisingon long term AC use Restarted Plavix for recent NSTEMI and PCI.  Holding Xarelto, unclear records, may be chart error and not necessary. -Wound care per nursing  Left arm injury Continued pain of left arm.  No left arm imaging on admission in ED. -Follow-up x-ray left upper and lower arm  Type 2 DM Glucose last 24 hours 110-169.  Admission A1c 6.9.  Holding home Jardiance and Metformin.  Has received only 1 unit sliding scale insulin yesterday at 1700. -Continue sensitive sliding scale insulin  Leukocytosis: Improving WBC 9.9 this morning, down from 15.9 on admission.  Patient continues to be afebrile. -A.m. CBC  Polycythemia: Improving Hemoglobin normal this morning at 13.9, down from 20.1 on admission.  Wonder if elevated hemoglobin on admission secondary to dehydration and hemoconcentration. -A.m. CBC -Awaiting peripheral blood smear  Hyponatremia Improving this morning, 133 today up from 131 on admission. -A.m. BMP  Hypokalemia Potassium this morning 3.1, ordered repletion.  Goal >4.  We will recheck afternoon BMP with repletion as necessary. -Afternoon BMP -Tomorrow morning BMP  Hypertension Patient largely normotensive overnight, isolated hypertensive measurements with systolics 573, 220.  No hypotensive episodes recorded. -Continue home meds of Coreg and Imdur  Chronic pain syndrome  Spinal stenosis At home on gabapentin 100 mg 3 times daily daily.  Chronic pain likely exacerbated by ecchymosis and swelling on back and arm from fall. -Tylenol 650 mg every 6 -Home gabapentin 100 mg 3 times daily -Oxycodone IR 10 mg every 6 as needed for severe pain -Ice or heat as needed -Add lidocaine patch  Hyperlipidemia Continue home atorvastatin and Zetia.  FEN/GI: Heart  healthy carb  modified PPx: Lovenox   Status is: Inpatient  Remains inpatient appropriate because:Ongoing diagnostic testing needed not appropriate for outpatient work up and Inpatient level of care appropriate due to severity of illness   Dispo: The patient is from: Home              Anticipated d/c is to: Home              Patient currently is not medically stable to d/c.   Difficult to place patient No   Subjective:  Patient laying in bed, appears more comfortable than yesterday.  Reports pain regimen working okay but wears off soon after taking.  While the pain medicine is working, makes him more comfortable.  Some nausea, no vomiting last 24 hours.  Objective: Temp:  [97.8 F (36.6 C)-98.3 F (36.8 C)] 97.8 F (36.6 C) (04/06 1215) Pulse Rate:  [61-68] 68 (04/06 1215) Resp:  [16-19] 16 (04/06 1215) BP: (113-133)/(64-75) 113/75 (04/06 1215) SpO2:  [93 %-99 %] 93 % (04/06 1215) Physical Exam: General: Awake, alert, oriented, no acute distress Cardiovascular: Regular rate and rhythm Respiratory: CTA B Abdomen: Mild TTP in epigastrium Extremities: No BLE edema  Laboratory: Recent Labs  Lab 09/17/20 1450 09/18/20 0135 09/19/20 0118  WBC 15.9* 15.0* 9.9  HGB 20.1* 17.1* 13.9  HCT 55.8* 48.1 39.2  PLT 247 206 174   Recent Labs  Lab 09/18/20 0135 09/18/20 0328 09/18/20 1943 09/19/20 0118  NA 133*  --  134* 133*  K 3.3*  --  3.5 3.1*  CL 98  --  103 102  CO2 20*  --  25 26  BUN 18  --  21 19  CREATININE 1.40*  --  1.31* 1.13  CALCIUM 8.9  --  8.2* 8.3*  PROT  --  7.0  --  5.5*  BILITOT  --  2.8*  --  1.8*  ALKPHOS  --  69  --  59  ALT  --  23  --  16  AST  --  20  --  16  GLUCOSE 126*  --  184* 110*    Imaging/Diagnostic Tests: CTA ABDOMEN AND PELVIS WITHOUT AND WITH CONTRAST 09/18/2020 Exam is significantly limited by the lack of a noncontrast phase and a the late venous phase. 2. While there is no definite active arterial bleeding, there is hyperdense material  within the stomach which is nonspecific. This could represent extravasated contrast or hyperdense food material. If there is clinical concern for ongoing GI bleeding, a repeat study should be performed with a noncontrast, arterial, and venous phase. 3. Nonobstructing right-sided nephrolithiasis. 4. Chronic findings as detailed above. 5.  Aortic Atherosclerosis (ICD10-I70.0). ADDENDUM: There is a contusion involving the patient's right flank. There is a small 0.7 cm hyperattenuating area in the posterior right flank (axial series 5, image 38). This was not visualized on the patient's prior study from September 03, 2020. There is an additional smaller hyperattenuating focus more superiorly measuring approximately 6 mm (axial series 5, image 26). Both of these areas are concerning for areas of active extravasation versus is small pseudoaneurysms in the setting of trauma. Exam is limited by lack of a venous phase.   Ezequiel Essex, MD 09/19/2020, 12:18 PM PGY-1, [facility] Intern pager: [phone], text pages welcome

## 2020-09-19 NOTE — TOC Initial Note (Addendum)
Transition of Care [facility]) - Initial/Assessment Note    Patient Details  Name: Bryan Scott MRN: [id number] Date of Birth: [birth year]  Transition of Care [facility]) CM/SW Contact:    Marilu Favre, RN Phone Number: 09/19/2020, 10:51 AM  Clinical Narrative:                 Patient from home alone. Discussed PT recommendations for SNF ( patient declines), HHPT with 24 supervision, 3 in1 , walker , wheel cahir, and hospital bed.   Patient states his sister Hinda Kehr already told him she will provide 24 hour supervision.   Patient has a rolator at home, he does not want 3 in 1 , rolling walker, wheel chair or hospital bed.   Patient goes to [place] in [place] is arranging to have a ramp built for patient. Patient states his [place] doctor rotates every 6 months. Patient also sees Dr [name] in [place].    Patient has a home health nurse who checks on him twice a month but he is not sure which agency the nurse is from. He is agreeable to add HHPT.   Patient active with [facility] . Left [place] with [facility] a message. Await call back. [place] with [facility] returned call. Patient is active with [facility] for [facility]. [facility] can provide HHRN,PT,OT,aide and SW will need orders   Expected Discharge Plan: [place]     Patient Goals and CMS Choice Patient states their goals for this hospitalization and ongoing recovery are:: to return to home CMS Medicare.gov Compare Post Acute Care list provided to:: Patient Choice offered to / list presented to : Patient  Expected Discharge Plan and Services Expected Discharge Plan: [place]   Discharge Planning Services: CM Consult Post Acute Care Choice: [place] arrangements for the past 2 months: Single Family Home                 DME Arranged:  (refused)         HH Arranged: PT          Prior Living Arrangements/Services Living arrangements for the past 2 months: Single Family  Home Lives with:: Self Patient language and need for interpreter reviewed:: Yes Do you feel safe going back to the place where you live?: Yes      Need for Family Participation in Patient Care: Yes (Comment) Care giver support system in place?: Yes (comment) Current home services: DME Criminal Activity/Legal Involvement Pertinent to Current Situation/Hospitalization: No - Comment as needed  Activities of Daily Living Home Assistive Devices/Equipment: None ADL Screening (condition at time of admission) Patient's cognitive ability adequate to safely complete daily activities?: Yes Is the patient deaf or have difficulty hearing?: No Does the patient have difficulty seeing, even when wearing glasses/contacts?: No Does the patient have difficulty concentrating, remembering, or making decisions?: No Patient able to express need for assistance with ADLs?: Yes Does the patient have difficulty dressing or bathing?: Yes Independently performs ADLs?: Yes (appropriate for developmental age) Does the patient have difficulty walking or climbing stairs?: Yes Weakness of Legs: Both Weakness of Arms/Hands: Left  Permission Sought/Granted   Permission granted to share information with : No              Emotional Assessment Appearance:: Appears stated age Attitude/Demeanor/Rapport: Engaged Affect (typically observed): Accepting Orientation: : Oriented to Self,Oriented to Place,Oriented to  Time,Oriented to Situation Alcohol / Substance Use: Not  Applicable Psych Involvement: No (comment)  Admission diagnosis:  Nonspecific abnormal electrocardiogram (ECG) (EKG) [R94.31] Dehydration [E86.0] Hypokalemia [E87.6] Syncope [R55] Fall [W19.XXXA] Syncope, unspecified syncope type [R55] Patient Active Problem List   Diagnosis Date Noted  . Protein-calorie malnutrition ([place]) 09/18/2020  . Syncope 09/17/2020   PCP:  [facility]:   [place], [place] [address] Phone: [phone] Fax: [place], [place] [address] Phone: [phone] Fax: [phone]     Social Determinants of Health (SDOH) Interventions    Readmission Risk Interventions No flowsheet data found.

## 2020-09-19 NOTE — Progress Notes (Signed)
Physical Therapy Treatment Patient Details Name: Bryan Scott MRN: [id number] DOB: April 07, 1946 Today's Date: 09/19/2020    History of Present Illness Bryan Scott is a 75 y/o male who presented to ED on 09/17/20 with lacerations on his chin and right UE following a fall in his bathroom after a syncopal episode. CT and DG was unremarkable for acute changes. PMH includes HTN, T2DM< A-fib, CAD s/p CABG and s/p coronary stent, HLD, spinal stenosis, tobacco use, and potential intraabdominal cancer/mass.    PT Comments    Pt received in bed, willing to participate in PT. Generally min-mod A for steadying as pt fatigues quickly. Completed transfers using hand-held assist but believe pt would benefit from RW if pt is willing to use it. Cueing needed for hand placement and for sequencing during transfers. Pt able to follow commands consistently with increased time. Pt reporting dizziness with all movement but no significant findings for orthostatic BP. Left in chair with all needs met, call bell within reach, chair alarm active, and RN present. Will continue to follow acutely.   Supine BP = 133/66, Pulse = 66 Sitting BP = 120/67, Pulse = 62 Standing at 0 min = 131/68, Pulse = 67 Standing at 3 min = 120/84, Pulse = 75   Follow Up Recommendations  Home health PT;Other (comment);Supervision for mobility/OOB (Pt declining SNF)     Equipment Recommendations  3in1 (PT);Wheelchair (measurements PT);Wheelchair cushion (measurements PT);Rolling walker with 5" wheels;Hospital bed    Recommendations for Other Services       Precautions / Restrictions Precautions Precautions: Fall Precaution Comments: monitor BP Restrictions Weight Bearing Restrictions: No    Mobility  Bed Mobility Overal bed mobility: Needs Assistance Bed Mobility: Supine to Sit     Supine to sit: Min assist     General bed mobility comments: Use of bed rail, min A to bring trunk upright    Transfers Overall transfer level: Needs  assistance Equipment used: 1 person hand held assist Transfers: Sit to/from [employer] Transfers Sit to Stand: Min assist Stand pivot transfers: Mod assist       General transfer comment: Min A for steadying during sit to stand, did a total of 3 STS that improved needing less assistance during 3rd STS, mod A for steadying for transfer  Ambulation/Gait             General Gait Details: deferred   Stairs             Wheelchair Mobility    Modified Rankin (Stroke Patients Only)       Balance Overall balance assessment: Needs assistance Sitting-balance support: Feet supported;Bilateral upper extremity supported Sitting balance-Leahy Scale: Fair Sitting balance - Comments: Able to complete LE exercises sitting on EOB   Standing balance support: During functional activity;Single extremity supported Standing balance-Leahy Scale: Poor Standing balance comment: dependent on external support, did use IV during standing, was able to stand for 3 min in order to measure orthostatics but fatigued at end of standing                            Cognition Arousal/Alertness: Awake/alert Behavior During Therapy: WFL for tasks assessed/performed Overall Cognitive Status: Within Functional Limits for tasks assessed                                 General Comments: A&Ox4. Tangential answers  Exercises General Exercises - Lower Extremity Long Arc Quad: AROM;Both;10 reps;Seated Hip Flexion/Marching: AROM;Both;10 reps;Seated    General Comments        Pertinent Vitals/Pain Faces Pain Scale: Hurts little more Pain Location: lower back Pain Descriptors / Indicators: Constant;Grimacing Pain Intervention(s): Monitored during session;Repositioned    Home Living                      Prior Function            PT Goals (current goals can now be found in the care plan section) Acute Rehab PT Goals Patient Stated Goal: return  home, get stomach issues figured out    Frequency    Min 3X/week      PT Plan Current plan remains appropriate    Co-evaluation              AM-PAC PT "6 Clicks" Mobility   Outcome Measure  Help needed turning from your back to your side while in a flat bed without using bedrails?: A Little Help needed moving from lying on your back to sitting on the side of a flat bed without using bedrails?: A Little Help needed moving to and from a bed to a chair (including a wheelchair)?: A Lot Help needed standing up from a chair using your arms (e.g., wheelchair or bedside chair)?: A Little Help needed to walk in hospital room?: A Lot Help needed climbing 3-5 steps with a railing? : Total 6 Click Score: 14    End of Session Equipment Utilized During Treatment: Gait belt Activity Tolerance: Patient limited by fatigue;Patient tolerated treatment well Patient left: with call bell/phone within reach;in chair;with chair alarm set;with nursing/sitter in room Nurse Communication: Mobility status;Other (comment) (Dressing needed rewrapped) PT Visit Diagnosis: Unsteadiness on feet (R26.81);Muscle weakness (generalized) (M62.81);History of falling (Z91.81)     Time:  -     Charges:                        Rosita Kea, SPT

## 2020-09-20 ENCOUNTER — Encounter (HOSPITAL_COMMUNITY): Payer: Self-pay | Admitting: Family Medicine

## 2020-09-20 ENCOUNTER — Ambulatory Visit: Payer: Medicare PPO | Admitting: Gastroenterology

## 2020-09-20 ENCOUNTER — Inpatient Hospital Stay (HOSPITAL_COMMUNITY): Payer: Medicare PPO | Admitting: Anesthesiology

## 2020-09-20 ENCOUNTER — Encounter (HOSPITAL_COMMUNITY)
Admission: EM | Disposition: A | Discharge status: Skilled Nursing Facility | Payer: Self-pay | Source: Home / Self Care | Attending: Family Medicine

## 2020-09-20 ENCOUNTER — Encounter: Payer: Self-pay | Admitting: Gastroenterology

## 2020-09-20 DIAGNOSIS — E860 Dehydration: Secondary | ICD-10-CM | POA: Diagnosis not present

## 2020-09-20 DIAGNOSIS — E440 Moderate protein-calorie malnutrition: Secondary | ICD-10-CM | POA: Diagnosis not present

## 2020-09-20 DIAGNOSIS — T1490XA Injury, unspecified, initial encounter: Secondary | ICD-10-CM

## 2020-09-20 DIAGNOSIS — W19XXXA Unspecified fall, initial encounter: Secondary | ICD-10-CM

## 2020-09-20 DIAGNOSIS — W19XXXD Unspecified fall, subsequent encounter: Secondary | ICD-10-CM | POA: Diagnosis not present

## 2020-09-20 DIAGNOSIS — R55 Syncope and collapse: Secondary | ICD-10-CM | POA: Diagnosis not present

## 2020-09-20 DIAGNOSIS — R1110 Vomiting, unspecified: Secondary | ICD-10-CM

## 2020-09-20 DIAGNOSIS — I25810 Atherosclerosis of coronary artery bypass graft(s) without angina pectoris: Secondary | ICD-10-CM | POA: Diagnosis not present

## 2020-09-20 DIAGNOSIS — R112 Nausea with vomiting, unspecified: Secondary | ICD-10-CM | POA: Diagnosis not present

## 2020-09-20 HISTORY — PX: ESOPHAGOGASTRODUODENOSCOPY (EGD) WITH PROPOFOL: SHX5813

## 2020-09-20 LAB — COMPREHENSIVE METABOLIC PANEL
ALT: 17 U/L (ref 0–44)
AST: 19 U/L (ref 15–41)
Albumin: 3.2 g/dL — ABNORMAL LOW (ref 3.5–5.0)
Alkaline Phosphatase: 68 U/L (ref 38–126)
Anion gap: 6 (ref 5–15)
BUN: 15 mg/dL (ref 8–23)
CO2: 24 mmol/L (ref 22–32)
Calcium: 8.3 mg/dL — ABNORMAL LOW (ref 8.9–10.3)
Chloride: 103 mmol/L (ref 98–111)
Creatinine, Ser: 0.98 mg/dL (ref 0.61–1.24)
GFR, Estimated: >60 mL/min (ref >60)
Glucose, Bld: 181 mg/dL — ABNORMAL HIGH (ref 70–99)
Potassium: 3.3 mmol/L — ABNORMAL LOW (ref 3.5–5.1)
Sodium: 133 mmol/L — ABNORMAL LOW (ref 135–145)
Total Bilirubin: 1.5 mg/dL — ABNORMAL HIGH (ref 0.3–1.2)
Total Protein: 6 g/dL — ABNORMAL LOW (ref 6.5–8.1)

## 2020-09-20 LAB — GLUCOSE, CAPILLARY
Glucose-Capillary: 123 mg/dL — ABNORMAL HIGH (ref 70–99)
Glucose-Capillary: 122 mg/dL — ABNORMAL HIGH (ref 70–99)
Glucose-Capillary: 182 mg/dL — ABNORMAL HIGH (ref 70–99)
Glucose-Capillary: 229 mg/dL — ABNORMAL HIGH (ref 70–99)

## 2020-09-20 SURGERY — ESOPHAGOGASTRODUODENOSCOPY (EGD) WITH PROPOFOL
Anesthesia: Monitor Anesthesia Care

## 2020-09-20 MED ORDER — FENTANYL CITRATE (PF) 100 MCG/2ML IJ SOLN
INTRAMUSCULAR | Status: AC
  Filled 2020-09-20: qty 2

## 2020-09-20 MED ORDER — FENTANYL CITRATE (PF) 250 MCG/5ML IJ SOLN
50.0000 ug | Freq: Once | INTRAMUSCULAR | Status: AC
  Administered 2020-09-20: 50 ug via INTRAVENOUS

## 2020-09-20 MED ORDER — PROPOFOL 500 MG/50ML IV EMUL
INTRAVENOUS | Status: DC | PRN
  Administered 2020-09-20: 125 ug/kg/min via INTRAVENOUS

## 2020-09-20 MED ORDER — POTASSIUM CHLORIDE CRYS ER 20 MEQ PO TBCR
40.0000 meq | EXTENDED_RELEASE_TABLET | Freq: Two times a day (BID) | ORAL | Status: AC
  Administered 2020-09-20 (×2): 40 meq via ORAL
  Filled 2020-09-20 (×2): qty 2

## 2020-09-20 MED ORDER — LACTATED RINGERS IV SOLN: INTRAVENOUS | Status: DC | PRN

## 2020-09-20 MED ORDER — LACTATED RINGERS IV SOLN: Freq: Once | INTRAVENOUS | Status: AC

## 2020-09-20 MED ORDER — PROPOFOL 10 MG/ML IV BOLUS
INTRAVENOUS | Status: DC | PRN
  Administered 2020-09-20: 20 mg via INTRAVENOUS
  Administered 2020-09-20: 10 mg via INTRAVENOUS

## 2020-09-20 MED ORDER — PHENYLEPHRINE 40 MCG/ML (10ML) SYRINGE FOR IV PUSH (FOR BLOOD PRESSURE SUPPORT)
PREFILLED_SYRINGE | INTRAVENOUS | Status: DC | PRN
  Administered 2020-09-20: 120 ug via INTRAVENOUS

## 2020-09-20 SURGICAL SUPPLY — 15 items

## 2020-09-20 NOTE — Progress Notes (Signed)
Occupational Therapy Treatment Patient Details Name: Bryan Scott MRN: [id number] DOB: January 24, 1946 Today's Date: 09/20/2020    History of present illness Bryan Scott is a 75 y/o male who presented to ED on 09/17/20 with lacerations on his chin and right UE following a fall in his bathroom after a syncopal episode. CT and DG was unremarkable for acute changes. PMH includes HTN, T2DM< A-fib, CAD s/p CABG and s/p coronary stent, HLD, spinal stenosis, tobacco use, and potential intraabdominal cancer/mass.   OT comments  Pt with slow progress towards OT goals, limited by back/LE pain, dizziness and elevated BP today. Pt overall Min A for bed mobility, Mod A for sit to stand at bedside with RW and sidesteps at bedside with min guard. Extended time spent collaborated and discussing use of back precautions during ADLs, fall prevention strategies, and DME needs. Pt reports shower at home too small for shower chair, encouraged sponge bathing at home initially to decrease fall risk. Pt with hx of low BP and syncope at home - reinforced pt checking BP prior to taking medications with pt verbalizing understanding. Pt reports sisters arranging 24/7 assist at discharge. Plan to progress OOB activities within pt's tolerance.   BP readings: Lying: 173/91 (114) Sitting EOB: 173/140 (150) Sitting EOB > 5 min: 153/95 (110)   Follow Up Recommendations  Home health OT;Supervision/Assistance - 24 hour    Equipment Recommendations  3 in 1 bedside commode;Hospital bed    Recommendations for Other Services      Precautions / Restrictions Precautions Precautions: Fall Precaution Comments: monitor BP Restrictions Weight Bearing Restrictions: No       Mobility Bed Mobility Overal bed mobility: Needs Assistance Bed Mobility: Supine to Sit;Sit to Supine     Supine to sit: Min assist;HOB elevated Sit to supine: Min assist   General bed mobility comments: Min A to advance trunk, Min A to get LE back into bed. Pt  reports log rolling does not ease back pain    Transfers Overall transfer level: Needs assistance Equipment used: Rolling walker (2 wheeled) Transfers: Sit to/from Stand Sit to Stand: Mod assist         General transfer comment: Mod A for power up from bed using RW, able to take steps at bedside to get higher in bed with min guard    Balance Overall balance assessment: Needs assistance Sitting-balance support: Feet supported;Bilateral upper extremity supported Sitting balance-Leahy Scale: Fair     Standing balance support: Bilateral upper extremity supported;During functional activity Standing balance-Leahy Scale: Poor Standing balance comment: reliant on UE support                           ADL either performed or assessed with clinical judgement   ADL Overall ADL's : Needs assistance/impaired                                       General ADL Comments: Discussed LB dressing tasks at home and strategies used. Encouraged use of back precautions to ease pain with pt familiar with these strategies (hx of multiple back surgeries). Discussed showering strategies with pt reports small walk in shower at home (likely unable to fit chair in it). Pt reports he often goes to [facility] to use sauna, showers there where they have handheld showerhead, chairs, and grab bars. Encouraged sponge bathing tasks initially due to decreased safety/endurance  for standing in shower. Limited in progression due to elevated BP, dizziness and pain today.     Vision   Vision Assessment?: No apparent visual deficits   Perception     Praxis      Cognition Arousal/Alertness: Awake/alert Behavior During Therapy: WFL for tasks assessed/performed Overall Cognitive Status: Within Functional Limits for tasks assessed                                 General Comments: A&Ox4, aware of deficits, demo some problem solving for safety at home        Exercises     Shoulder  Instructions       General Comments Pt with dizziness throughout while sitting EOB. Assessed BP lying at 173/91 (114), sitting EOB 173/140 (150), after sitting > 5 min 153/95 (110). HR stable 67-76bpm.    Pertinent Vitals/ Pain       Pain Assessment: Faces Faces Pain Scale: Hurts whole lot Pain Location: lower back, B LE Pain Descriptors / Indicators: Constant;Grimacing Pain Intervention(s): Monitored during session;Limited activity within patient's tolerance;Premedicated before session  Home Living                                          Prior Functioning/Environment              Frequency  Min 2X/week        Progress Toward Goals  OT Goals(current goals can now be found in the care plan section)  Progress towards OT goals: Progressing toward goals  Acute Rehab OT Goals Patient Stated Goal: return home, get stomach issues figured out OT Goal Formulation: With patient Time For Goal Achievement: 10/02/20 Potential to Achieve Goals: Good ADL Goals Pt Will Perform Grooming: with supervision;standing Pt Will Perform Upper Body Dressing: with modified independence;sitting Pt Will Perform Lower Body Dressing: with min guard assist;sit to/from stand Pt Will Transfer to Toilet: with supervision;ambulating Pt Will Perform Toileting - Clothing Manipulation and hygiene: with supervision;sit to/from stand  Plan Discharge plan remains appropriate    Co-evaluation                 AM-PAC OT "6 Clicks" Daily Activity     Outcome Measure   Help from another person eating meals?: None Help from another person taking care of personal grooming?: A Little Help from another person toileting, which includes using toliet, bedpan, or urinal?: A Little Help from another person bathing (including washing, rinsing, drying)?: A Little Help from another person to put on and taking off regular upper body clothing?: A Little Help from another person to put on and  taking off regular lower body clothing?: A Lot 6 Click Score: 18    End of Session Equipment Utilized During Treatment: Gait belt;Rolling walker  OT Visit Diagnosis: Unsteadiness on feet (R26.81);History of falling (Z91.81);Muscle weakness (generalized) (M62.81);Other symptoms and signs involving cognitive function   Activity Tolerance Patient limited by pain;Treatment limited secondary to medical complications (Comment)   Patient Left in bed;with call bell/phone within reach   Nurse Communication Mobility status;Other (comment) (BP)        Time: 6144-3154 OT Time Calculation (min): 39 min  Charges: OT General Charges $OT Visit: 1 Visit OT Treatments $Self Care/Home Management : 8-22 mins $Therapeutic Activity: 23-37 mins  Malachy Chamber, OTR/L Acute Rehab Services  Office: [phone]   Layla Maw 09/20/2020, 9:58 AM

## 2020-09-20 NOTE — Transfer of Care (Signed)
Immediate Anesthesia Transfer of Care Note  Patient: Bryan Scott  Procedure(s) Performed: ESOPHAGOGASTRODUODENOSCOPY (EGD) WITH PROPOFOL (N/A )  Patient Location: PACU  Anesthesia Type:MAC  Level of Consciousness: drowsy and patient cooperative  Airway & Oxygen Therapy: Patient Spontanous Breathing and Patient connected to nasal cannula oxygen  Post-op Assessment: Report given to RN, Post -op Vital signs reviewed and stable and Patient moving all extremities  Post vital signs: Reviewed and stable  Last Vitals:  Vitals Value Taken Time  BP 106/48 09/20/20 1435  Temp    Pulse 66 09/20/20 1436  Resp 23 09/20/20 1436  SpO2 99 % 09/20/20 1436  Vitals shown include unvalidated device data.  Last Pain:  Vitals:   09/20/20 1359  TempSrc:   PainSc: 8       Patients Stated Pain Goal: 2 (17/98/10 2548)  Complications: No complications documented.

## 2020-09-20 NOTE — Progress Notes (Addendum)
Progress Note  Patient Name: Bryan Scott Date of Encounter: 09/20/2020  [facility] Cardiologist: follow cardiology at [facility]   Patient states he is not feeling too well today. He c/o back and abdominal pain, nausea with intermittent emesis, denied any diarrhea. He states he is scheduled for EGD at 1 o'clock today.  His blood pressure was elevated this morning.  He continued to feel dizzy when he sits up.  He has not been ambulating much.  He denies any chest pain or heart palpitation, leg edema.   Inpatient Medications    Scheduled Meds: . acetaminophen  650 mg Oral Q6H  . atorvastatin  80 mg Oral Daily  . carvedilol  3.125 mg Oral BID WC  . enoxaparin (LOVENOX) injection  40 mg Subcutaneous Q24H  . ezetimibe  10 mg Oral Daily  . feeding supplement  237 mL Oral TID BM  . gabapentin  100 mg Oral TID  . insulin aspart  0-9 Units Subcutaneous TID WC  . lidocaine  1 patch Transdermal Q24H  . metoCLOPramide  10 mg Oral TID AC & HS  . pantoprazole  40 mg Oral Daily  . potassium chloride  40 mEq Oral BID  . sucralfate  1 g Oral QID   Continuous Infusions: . sodium chloride 125 mL/hr at 09/20/20 0806   PRN Meds: ondansetron, oxyCODONE, polyethylene glycol   Vital Signs    Vitals:   09/19/20 1814 09/19/20 2310 09/20/20 0425 09/20/20 0800  BP: 109/60 132/67 (!) 163/84 (!) 184/84  Pulse: 62 68 66 66  Resp: 16 18 15 18   Temp: 98.5 F (36.9 C) 97.9 F (36.6 C) 97.7 F (36.5 C) (!) 97.5 F (36.4 C)  TempSrc: Oral Oral Oral Oral  SpO2: 95% 97% 96% 97%  Weight:      Height:        Intake/Output Summary (Last 24 hours) at 09/20/2020 1113 Last data filed at 09/20/2020 1057 Gross per 24 hour  Intake 4586.73 ml  Output 2725 ml  Net 1861.73 ml   Last 3 Weights 09/17/2020  Weight (lbs) 158 lb  Weight (kg) 71.668 kg      Telemetry    Sinus rhythm  60s with occasional PVC- Personally Reviewed  ECG    EKG reveals sinus rhythm, rate of 65, resolved first-degree AV  block, no acute change - personally Reviewed  Physical Exam   GEN: No acute distress.  Fatigued.  Oral cavity moist Neck: No JVD Cardiac: RRR, no murmurs, rubs, or gallops.  Respiratory: Clear to auscultation bilaterally. GI: Soft, diffuse generalized abdominal tenderness, no rigidity, voluntary guarding, not distended MS: No right lower extremity edema; No deformity. Neuro:  Nonfocal  Psych: Normal affect  Skin: Left arm with Kerlix dressing in place.   Labs    High Sensitivity Troponin:   Recent Labs  Lab 09/17/20 2158 09/18/20 0135  TROPONINIHS 10 8      Chemistry Recent Labs  Lab 09/18/20 0328 09/18/20 1943 09/19/20 0118 09/20/20 0034  NA  --  134* 133* 133*  K  --  3.5 3.1* 3.3*  CL  --  103 102 103  CO2  --  25 26 24   GLUCOSE  --  184* 110* 181*  BUN  --  21 19 15   CREATININE  --  1.31* 1.13 0.98  CALCIUM  --  8.2* 8.3* 8.3*  PROT 7.0  --  5.5* 6.0*  ALBUMIN 3.5 2.8* 2.9* 3.2*  AST 20  --  16 19  ALT 23  --  16 17  ALKPHOS 69  --  59 68  BILITOT 2.8*  --  1.8* 1.5*  GFRNONAA  --  57* >60 >60  ANIONGAP  --  6 5 6      Hematology Recent Labs  Lab 09/19/20 0118 09/19/20 0836 09/19/20 1728  WBC 9.9 9.1 8.0  RBC 4.19* 4.28 4.42  HGB 13.9 14.5 14.6  HCT 39.2 41.1 41.8  MCV 93.6 96.0 94.6  MCH 33.2 33.9 33.0  MCHC 35.5 35.3 34.9  RDW 13.1 13.2 13.1  PLT 174 172 176    BNPNo results for input(s): BNP, PROBNP in the last 168 hours.   DDimer No results for input(s): DDIMER in the last 168 hours.   Radiology    DG Forearm Left  Result Date: 09/19/2020 CLINICAL DATA:  Pain following recent fall EXAM: LEFT FOREARM - 2 VIEW COMPARISON:  None. FINDINGS: Frontal and lateral views were obtained. No appreciable fracture or dislocation. Joint spaces appear normal. No erosive change. IMPRESSION: No fracture or dislocation.  No appreciable arthropathy. Electronically Signed   By: Lowella Grip III M.D.   On: 09/19/2020 13:27   DG Humerus Left  Result  Date: 09/19/2020 CLINICAL DATA:  Pain following fall EXAM: LEFT HUMERUS - 2+ VIEW COMPARISON:  May 03, 2012 FINDINGS: Frontal and lateral views were obtained. There is screw and plate fixation through a previous fracture of the proximal left humerus. There is remodeling with bony overgrowth in this area. No acute fracture or dislocation. No appreciable joint space narrowing or erosion. IMPRESSION: Postoperative fixation for prior fracture proximal left humerus with bony overgrowth and remodeling in the proximal left humerus. No acute fracture or dislocation. No appreciable joint space narrowing or erosion. Electronically Signed   By: Lowella Grip III M.D.   On: 09/19/2020 13:26   CT Angio Abd/Pel w/ and/or w/o  Addendum Date: 09/19/2020   ADDENDUM REPORT: 09/19/2020 01:15 ADDENDUM: There is a contusion involving the patient's right flank. There is a small 0.7 cm hyperattenuating area in the posterior right flank (axial series 5, image 38). This was not visualized on the patient's prior study from September 03, 2020. There is an additional smaller hyperattenuating focus more superiorly measuring approximately 6 mm (axial series 5, image 26). Both of these areas are concerning for areas of active extravasation versus is small pseudoaneurysms in the setting of trauma. Exam is limited by lack of a venous phase. These results were called by telephone at the time of interpretation on 09/19/2020 at 1:14 am to provider Jeannine Kitten , who verbally acknowledged these results. Electronically Signed   By: Constance Holster M.D.   On: 09/19/2020 01:15   Result Date: 09/19/2020 CLINICAL DATA:  Upper GI bleed post endoscopy. EXAM: CTA ABDOMEN AND PELVIS WITHOUT AND WITH CONTRAST TECHNIQUE: Multidetector CT imaging of the abdomen and pelvis was performed using the standard protocol during bolus administration of intravenous contrast. Multiplanar reconstructed images and MIPs were obtained and reviewed to evaluate the vascular  anatomy. CONTRAST:  157mL OMNIPAQUE IOHEXOL 350 MG/ML SOLN COMPARISON:  September 03, 2020 FINDINGS: VASCULAR Aorta: Atherosclerotic changes are noted without evidence for an aneurysm. Celiac: There is variant celiac artery anatomy. The common hepatic artery arises directly from the aorta. There is significant narrowing of the origin of the common hepatic artery. The splenic artery arises directly from the aorta. There is no significant narrowing. SMA: There is mild-to-moderate narrowing involving the SMA. Renals: There is mild narrowing of  the left renal artery. There is mild narrowing of the right renal artery. IMA: There is moderate narrowing at the origin of the IMA. Inflow: There is a right common iliac artery stent that is patent. There is mild narrowing of the distal right common iliac artery. Proximal Outflow: Bilateral common femoral and visualized portions of the superficial and profunda femoral arteries are patent without evidence of aneurysm, dissection, vasculitis or significant stenosis. Veins: No obvious venous abnormality within the limitations of this arterial phase study. Review of the MIP images confirms the above findings. NON-VASCULAR Lower chest: There is atelectasis at the lung bases.The heart size is mildly enlarged. There is reflux of contrast into the IVC. Hepatobiliary: The liver is normal. Normal gallbladder.There is no biliary ductal dilation. Pancreas: The pancreatic duct is slightly prominent. Spleen: Unremarkable. Adrenals/Urinary Tract: --Adrenal glands: Unremarkable. --Right kidney/ureter: Again noted is a nonobstructing stone in the right kidney measuring approximately 1.4 cm. There is no hydronephrosis. --Left kidney/ureter: No hydronephrosis or radiopaque kidney stones. --Urinary bladder: Unremarkable. Stomach/Bowel: --Stomach/Duodenum: There is hyperdense material within stomach. --Small bowel: Unremarkable. --Colon: Unremarkable. --Appendix: Normal. Vascular/Lymphatic:  Atherosclerotic calcification is present within the non-aneurysmal abdominal aorta, without hemodynamically significant stenosis. There is mild-to-moderate narrowing of the SMA both in the mid and distal sections. There is mild narrowing of the left renal artery. --No retroperitoneal lymphadenopathy. --No mesenteric lymphadenopathy. --No pelvic or inguinal lymphadenopathy. Reproductive: Unremarkable Other: No ascites or free air. The abdominal wall is normal. Musculoskeletal. There are advanced degenerative changes throughout the lumbar spine. No acute fracture. IMPRESSION: 1. Exam is significantly limited by the lack of a noncontrast phase and a the late venous phase. 2. While there is no definite active arterial bleeding, there is hyperdense material within the stomach which is nonspecific. This could represent extravasated contrast or hyperdense food material. If there is clinical concern for ongoing GI bleeding, a repeat study should be performed with a noncontrast, arterial, and venous phase. 3. Nonobstructing right-sided nephrolithiasis. 4. Chronic findings as detailed above. 5.  Aortic Atherosclerosis (ICD10-I70.0). Electronically Signed: By: Constance Holster M.D. On: 09/19/2020 00:19    Cardiac Studies    TTE 09/04/20:  1.Left ventricular ejection fraction, by estimation, is 70 to 75%. The left ventricle has hyperdynamic function. Left ventricular endocardial border not optimally defined to evaluate regional wall motion. Left ventricular diastolic parameters are consistent with Grade I diastolic dysfunction (impaired relaxation). 2. Right ventricular systolic function is normal. The right ventricular size is normal. 3. The mitral valve is grossly normal. Trivial mitral valve regurgitation. No evidence of mitral stenosis. 4. The aortic valve is tricuspid. There is mild calcification of the aortic valve. There is mild thickening of the aortic valve. Aortic valve regurgitation is not  visualized. Mild to moderate aortic valve sclerosis/calcification is present, without any evidence of aortic stenosis. 5. The inferior vena cava is normal in size with greater than 50% respiratory variability, suggesting right atrial pressure of 3 mmHg.   Left heart catheretization on1/28/22:  1. Multivessel coronary artery and bypass graft disease, as detailed in Dr. Donivan Scull diagnostic catheterization report. Culprit lesion appears to be 95% stenosis in SVG-RCA. 2. Successful PCI to SVG-RCA using Resolute Onyx 3.5 x 18 mm drug-eluting stent with 0% residual stenosis and TIMI-3 flow.  Recommendations:  1. Remove right femoral artery sheath 2 hours after discontinuation of bivalirudin. 2. Restart heparin 8 hours after sheath removal. Transition to rivaroxaban tomorrow if no evidence of bleeding/vascular complication. 3. Continue clopidogrel for 12 months (defer aspirin in  the setting of aspirin allergy on chronic anticoagulation with rivaroxaban). 4. Aggressive secondary prevention.   Patient Profile     Bryan Scott a 75 y.o.malewith a PMH of CAD s/p CABG in 2006and PCI with DES to SVG-RCAon1/28/2022, Paroxsymal A fib(on Xarelto), type 2 DM, hypertension, hyperlipidemia, carotid artery stenosis, cervical spinal stenosis s/p C5-C7 ACDF, chronic pain syndrome, hx of tobacco use, who presented to the [facility] for syncope and fall at home.  Cardiology is consulted and following for syncope.   Assessment & Plan    Syncope and collapse  CAD with hx of CABG 2006 and PCI 06/2020  - patient presented with 2nd episode of syncope similar to March 2022 hospitalization, after having persistent intractable nausea with emesis, diarrhea, and poor PO intake over the past 2 month. History reveals no chest pain or ACS equivalent symptoms. Hs Trop negative x2. EKG showed sinus rhythm with first degree AV block, ventricular rate of 61bpm, andnon-specific T wave abnormalities, no acute changes.  Telemetry without acute arrhythmia. Echo from 09/04/20 with EF 70-75%, grade I DD. Suspected clinical presentation is due to orthostasis /dehydration +/- micturition, low suspicion for ischemic process induced syncope - orthostatic VS negative , BP elevated this morning  - continue telemetry monitor, may repeat EKG if needed   - UOP adequate, appears improved hypovolemia, consider reduce IVF rate  - recommend optimize electrolyte deficiency with goal of K >4 and Mag >2  -recommendcontinueGDMT with Coreg (if not hypotensive), Statin, Zetia - Noted Plavix is on hold since 09/18/20 per GI recommendation for EGD, s/p stent 1/22, strongly recommend resume Plavix ASAP if there is no active bleeding concern as patient is high CV risk  Hypertension - BP elevated today 163/84 - 184/84, possibly pain and anxiety related - serial BP, may resume home meds Imdur if remains persistent elevated and reducing IVF rate   AKI, presumed pre-renal, resolved   Hyponatremia, stable  - history, labs, and clinical exam suggest significant hypovolemia, POA   - Cr 1.4 POA, was 1 from last discharge on 09/06/20 , now near baseline - resolved with IVF hydration  - avoid nephrotoxic agents and hypotension  - hold metformin  Hypokalemia Hypomagnesemia -continue replete electrolyte to goal   Nausea, vomiting, diarrhea, abdominal pain Indirect hyperbilirubinemia - ongoing GI symptoms for 2 months without improvement - leukocytosis resolved today, likely reactive , afebrile  - GI is planning for EGD today  - management per primary team   Paroxsymal A fib - currently in SR - CHADSVAS score 5 (age, HTN, CAD, DM) - continue coreg, Ok to holdxareltobefore invasive procedureif planned   Type 2 DM - Hgb A1C 6.9%, fairly controlled at goal  - hold metformin, agree with insulin SSI in house   HLD - LDL 29 on 08/08/20 , at goal - continue statin and zetia  Protein calorie malnutrition, moderate -  reports significant weight loss for the past few month  - agree with nutrition consult and protein supplement   Chronic pain syndrome - management per primary team     For questions or updates, please contact [facility] Please consult [email or web address] for contact info under        Signed, Margie Billet, NP  09/20/2020, 11:13 AM    Patient seen and examined Margie Billet, NP and agree with above.  In brief, the patient is a75 y.o.malewith a PMH of CAD s/p CABG in 2006and PCI with DES to SVG-RCAon1/28/2022, paroxsymal A fib(on Xarelto), DMII, HTN, HLD, carotid artery stenosis,  cervical spinal stenosis s/p C5-C7 ACDF, chronic pain syndrome, hx of tobacco use, and frequent admissions for syncope in the setting of  dehydration due to nausea/vomiting/diarrhea who returns to the ER with syncope for which Cardiology has been consulted.   Patient with recurrent syncope in the setting of nausea, vomiting and poor PO intake.  Suspect his recurrent syncopal episode is secondary to orthostasis and dehydration again with low suspicion for ACS or arrhythmia. Trop negative. ECG with nonspecific ST-T wave changes but no acute ischemia. Agree with GI work-up for ongoing nausea, vomiting and weight loss as well as continued fluid hydration.  Orthostatics negative today. Blood pressures elevated.   GEN: Laying in bed. Mildly uncomfortable due to nausea   Neck: No JVD Cardiac: RRR, no murmurs, rubs, or gallops.  Respiratory: Clear to auscultation bilaterally. GI: Soft, nontender, non-distended  MS: No edema; No deformity. Multiple scattered ecchymosis. Left arm wrapped in dressing Neuro:  Nonfocal  Psych: Normal affect   Plan: -Patient planned for EGD today -Plavix has been held; patient is very high risk. This was not advised by our team but perhaps discussed with out-patient Cardiologist/[facility]. Would strongly recommend resuming as soon as possible given recent PCI and known severe  CAD -Cannot tolerate ASA due to allergy; low threshold to start cangrelor if anticipate longer plavix hold -Okay to hold xarelto for EGD; again if prolonged period needed off xarelto, will need IV heparin bridge -Resume imdur given improved orthostasis and hypertension today -Continue statin and zetia -Continue telemetry  Gwyndolyn Kaufman, MD

## 2020-09-20 NOTE — Anesthesia Postprocedure Evaluation (Signed)
Anesthesia Post Note  Patient: Bryan Scott  Procedure(s) Performed: ESOPHAGOGASTRODUODENOSCOPY (EGD) WITH PROPOFOL (N/A )     Patient location during evaluation: Endoscopy Anesthesia Type: MAC Level of consciousness: awake and alert Pain management: pain level controlled Vital Signs Assessment: post-procedure vital signs reviewed and stable Respiratory status: spontaneous breathing, nonlabored ventilation, respiratory function stable and patient connected to nasal cannula oxygen Cardiovascular status: stable and blood pressure returned to baseline Postop Assessment: no apparent nausea or vomiting Anesthetic complications: no   No complications documented.  Last Vitals:  Vitals:   09/20/20 1454 09/20/20 1507  BP: (!) 151/72 (!) 150/83  Pulse: 70 70  Resp: 20 20  Temp:  36.6 C  SpO2: 97% 97%    Last Pain:  Vitals:   09/20/20 1507  TempSrc: Oral  PainSc:                  [name]

## 2020-09-20 NOTE — Op Note (Addendum)
[facility] Patient Name: Bryan Scott Procedure Date : 09/20/2020 MRN: [id number] Attending MD: Ladene Artist , MD Date of Birth: 1946-02-08 CSN: [id number] Age: 75 Admit Type: Inpatient Procedure:                Upper GI endoscopy Indications:              Nausea with vomiting Providers:                Pricilla Riffle. Fuller Plan, MD, Kary Kos RN, RN, Ladona Ridgel, Technician, Claybon Jabs CRNA, CRNA Referring MD:             Lenoria Chime, MD Medicines:                Monitored Anesthesia Care Complications:            No immediate complications. Estimated Blood Loss:     Estimated blood loss: none. Procedure:                Pre-Anesthesia Assessment:                           - Prior to the procedure, a History and Physical                            was performed, and patient medications and                            allergies were reviewed. The patient's tolerance of                            previous anesthesia was also reviewed. The risks                            and benefits of the procedure and the sedation                            options and risks were discussed with the patient.                            All questions were answered, and informed consent                            was obtained. Prior Anticoagulants: The patient has                            taken Plavix (clopidogrel) and Xarelto, last doses                            were 2 days prior to procedure. ASA Grade                            Assessment: III - A patient with severe systemic  disease. After reviewing the risks and benefits,                            the patient was deemed in satisfactory condition to                            undergo the procedure.                           After obtaining informed consent, the endoscope was                            passed under direct vision. Throughout the                             procedure, the patient's blood pressure, pulse, and                            oxygen saturations were monitored continuously. The                            GIF-H190 ([id number] Olympus gastroscope was                            introduced through the mouth, and advanced to the                            second part of duodenum. The upper GI endoscopy was                            accomplished without difficulty. The patient                            tolerated the procedure well. Scope In: Scope Out: Findings:      The examined esophagus was normal.      The entire examined stomach was normal.      The duodenal bulb and second portion of the duodenum were normal. Impression:               - Normal esophagus.                           - Normal stomach.                           - Normal duodenal bulb and second portion of the                            duodenum.                           - No specimens collected. Recommendation:           - Return patient to hospital ward for ongoing care.                           -  Full liquid diet today and advance slowly as                            tolerated.                           - Continue present medications including                            metoclopramide 10 mg ac & hs for now.                           - If nausea, vomiting persist change Zofran from                            tid prn to tid                           - OK to resume Plavix (clopidogrel) tomorrow and                            Xarelto (rivaroxaban) tomorrow at prior doses, as                            clinically indicated. Refer to managing physician                            for further adjustment of therapy. Procedure Code(s):        --- Professional ---                           [phone], Esophagogastroduodenoscopy, flexible,                            transoral; diagnostic, including collection of                            specimen(s) by brushing or washing, when  performed                            (separate procedure) Diagnosis Code(s):        --- Professional ---                           R11.2, Nausea with vomiting, unspecified CPT copyright 2019 [employer]. All rights reserved. The codes documented in this report are preliminary and upon coder review may  be revised to meet current compliance requirements. Ladene Artist, MD 09/20/2020 2:35:34 PM This report has been signed electronically. Number of Addenda: 0

## 2020-09-20 NOTE — Plan of Care (Signed)
  Problem: Education: Goal: Knowledge of General Education information will improve Description: Including pain rating scale, medication(s)/side effects and non-pharmacologic comfort measures Outcome: Progressing   Problem: Clinical Measurements: Goal: Respiratory complications will improve Outcome: Progressing   Problem: Nutrition: Goal: Adequate nutrition will be maintained Outcome: Progressing   Problem: Coping: Goal: Level of anxiety will decrease Outcome: Progressing   Problem: Elimination: Goal: Will not experience complications related to urinary retention Outcome: Progressing   Problem: Pain Managment: Goal: General experience of comfort will improve Outcome: Progressing   Problem: Safety: Goal: Ability to remain free from injury will improve Outcome: Progressing   

## 2020-09-20 NOTE — Progress Notes (Addendum)
Family Medicine Teaching Service Daily Progress Note Intern Pager: [phone]  Patient name: Bryan Scott Medical record number: [id number] Date of birth: 08-14-1945 Age: 75 y.o. Gender: male  Primary Care Provider: Administration, [facility] Consultants: Cardiology, GI Code Status: Full  Pt Overview and Major Events to Date:  4/4 admitted  Assessment and Plan: Bryan Scott is a 75 year old male presenting with nausea, vomiting, abrasions and contusions secondary to syncopal episode.  PMH includes HTN, T2DM, A. fib, CAD s/p CABG 2007 and s/p coronary stent 2002, HLD, spinal stenosis, history of tobacco use.   Syncope Patient still dizzy upon standing. Orthostatics improved today:  Lying 133/66, pulse 59  Sitting 120-67, pulse 62  Standing at 0 minutes 131/68, pulse 67  Standing at 3 minutes 120/84, pulse 75 -Continue holding Imdur per cardiology -Continuous cardiac monitoring -Continue IVF with NS at 125 mL/h -Encourage p.o. intake as tolerated -A.m. labs: CBC, CMP -A.m. EKG -PT OT eval and treat -Up with assistance   Nausea and vomiting  Concern for intra-abdominal process GI consulted, plan for possible EGD today.  -Per GI, continue Protonix, Reglan, Carafate -Zofran ODT every 8 as needed -Protonix 40 mg daily -IV fluids with NS at 125 mils an hour   CAD s/p CABG 2007  NSTEMI s/p coronary stent 2022  HFrEF TTE on 3/26 at outside hospital shows LVEF 40 to 84%, grade 1 diastolic dysfunction (impaired relaxation). Discussed case with cardiology. Patient is high risk for pausing anticoagulation and antiplatelet therapy due to recent PCI; however it is felt to be beneficial for the patient to have the EGD today. Plavix held for 48 hours, plan to restart after EGD.  -Continuing home atorvastatin, Imdur -Holding home Xarelto, Plavix for upcoming GI procedure -Cardiology consulted, appreciate recommendations   Skin abrasions  bruising on long term AC use -Wound care per nursing      Type 2 DM Glucose overnight 123-181.  The last 24 hours has received a total of 5 units insulin aspart on sliding scale. -Continue sensitive sliding scale insulin -Holding home Jardiance and Metformin   Polycythemia Improving, hemoglobin normalized to 14.6 last measurement.  Polycythemia on admission appears to be hemoconcentration secondary to dehydration. -A.m. CBC -Awaiting peripheral blood smear   Hypokalemia A.m. potassium 3.3 this morning despite 80 M EQ potassium chloride p.o. yesterday.  Will replete again this morning with K-Dur 40 mEq twice daily x2 doses. -A.m. BMP   Hypertension Again patient largely normotensive last 24 hours.  On isolated hypertensive measurement 163/84 this morning. -Continue home meds of Coreg and Imdur    Chronic pain syndrome  Spinal stenosis  At home on gabapentin 100 mg 3 times daily daily.  Chronic pain likely exacerbated by ecchymosis and swelling on back and arm from fall. -Tylenol 650 mg every 6 -Home gabapentin 100 mg 3 times daily -Oxycodone IR 10 mg every 6 as needed for severe pain (will work on weaning down over next couple days) -Ice or heat as needed -Lidocaine patch as needed -Offer rib binding elastics for comfort   Hyperlipidemia Continue home atorvastatin and Zetia.   FEN/GI: NPO PPx: Lovenox   Status is: Inpatient  Remains inpatient appropriate because:Ongoing diagnostic testing needed not appropriate for outpatient work up and Inpatient level of care appropriate due to severity of illness   Dispo:  Patient From: Home  Planned Disposition: Home  Medically stable for discharge: No       Subjective:  Patient found laying in bed, no acute distress.  Patient reports  having increased pain overnight, mostly in his lower back.  Pain worse at night, keeps from sleeping.  Has not slept in 3 nights.  Also has some nausea with eating, reports that he has not vomited though because the nurses give him antinausea medicine  before he eats.  He is looking forward to the EGD today and hoping for some answers.  Objective: Temp:  [97.7 F (36.5 C)-98.5 F (36.9 C)] 97.7 F (36.5 C) (04/07 0425) Pulse Rate:  [62-68] 66 (04/07 0425) Resp:  [15-18] 15 (04/07 0425) BP: (109-163)/(60-84) 163/84 (04/07 0425) SpO2:  [93 %-97 %] 96 % (04/07 0425) Physical Exam: General: Awake, alert, no acute distress Cardiovascular: Regular rate and rhythm, no murmur Respiratory: CTA B Abdomen: TTP in epigastrium Extremities: Left forearm wrapped and bandaged  Laboratory: Recent Labs  Lab 09/19/20 0118 09/19/20 0836 09/19/20 1728  WBC 9.9 9.1 8.0  HGB 13.9 14.5 14.6  HCT 39.2 41.1 41.8  PLT 174 172 176   Recent Labs  Lab 09/18/20 0328 09/18/20 1943 09/19/20 0118 09/20/20 0034  NA  --  134* 133* 133*  K  --  3.5 3.1* 3.3*  CL  --  103 102 103  CO2  --  25 26 24   BUN  --  21 19 15   CREATININE  --  1.31* 1.13 0.98  CALCIUM  --  8.2* 8.3* 8.3*  PROT 7.0  --  5.5* 6.0*  BILITOT 2.8*  --  1.8* 1.5*  ALKPHOS 69  --  59 68  ALT 23  --  16 17  AST 20  --  16 19  GLUCOSE  --  184* 110* 181*    Imaging/Diagnostic Tests: LEFT HUMERUS - 2+ VIEW 09/19/2020 COMPARISON:  May 03, 2012 FINDINGS: Frontal and lateral views were obtained. There is screw and plate fixation through a previous fracture of the proximal left humerus. There is remodeling with bony overgrowth in this area. No acute fracture or dislocation. No appreciable joint space narrowing or erosion. IMPRESSION: Postoperative fixation for prior fracture proximal left humerus with bony overgrowth and remodeling in the proximal left humerus. No acute fracture or dislocation. No appreciable joint space narrowing or erosion.  LEFT FOREARM - 2 VIEW 09/19/2020 COMPARISON:  None. FINDINGS: Frontal and lateral views were obtained. No appreciable fracture or dislocation. Joint spaces appear normal. No erosive change. IMPRESSION: No fracture or dislocation.   No appreciable arthropathy.    Bryan Essex, MD 09/20/2020, 7:31 AM PGY-1, [employer] Intern pager: [phone], text pages welcome

## 2020-09-20 NOTE — Anesthesia Preprocedure Evaluation (Addendum)
Anesthesia Evaluation  Patient identified by MRN, date of birth, ID band Patient awake    Reviewed: Allergy & Precautions, H&P , NPO status , Patient's Chart, lab work & pertinent test results, reviewed documented beta blocker date and time   Airway Mallampati: II  TM Distance: >3 FB Neck ROM: full    Dental no notable dental hx. (+) Edentulous Upper, Edentulous Lower   Pulmonary neg pulmonary ROS, former smoker,    Pulmonary exam normal breath sounds clear to auscultation       Cardiovascular Exercise Tolerance: Good hypertension, Pt. on medications + Cardiac Stents and + CABG  + dysrhythmias Atrial Fibrillation  Rhythm:regular Rate:Normal     Neuro/Psych negative neurological ROS  negative psych ROS   GI/Hepatic negative GI ROS, Neg liver ROS,   Endo/Other  diabetes, Type 2, Oral Hypoglycemic Agents  Renal/GU negative Renal ROS  negative genitourinary   Musculoskeletal   Abdominal   Peds  Hematology negative hematology ROS (+)   Anesthesia Other Findings   Reproductive/Obstetrics negative OB ROS                            Anesthesia Physical Anesthesia Plan  ASA: III  Anesthesia Plan: MAC   Post-op Pain Management:    Induction: Intravenous  PONV Risk Score and Plan: 1 and Propofol infusion  Airway Management Planned: Nasal Cannula, Natural Airway and Simple Face Mask  Additional Equipment: None  Intra-op Plan:   Post-operative Plan:   Informed Consent: I have reviewed the patients History and Physical, chart, labs and discussed the procedure including the risks, benefits and alternatives for the proposed anesthesia with the patient or authorized representative who has indicated his/her understanding and acceptance.     Dental Advisory Given  Plan Discussed with: CRNA and Anesthesiologist  Anesthesia Plan Comments:         Anesthesia Quick Evaluation

## 2020-09-20 NOTE — Interval H&P Note (Signed)
History and Physical Interval Note:  09/20/2020 2:10 PM  Bryan Scott  has presented today for surgery, with the diagnosis of vomiting.  The various methods of treatment have been discussed with the patient and family. After consideration of risks, benefits and other options for treatment, the patient has consented to  Procedure(s): ESOPHAGOGASTRODUODENOSCOPY (EGD) WITH PROPOFOL (N/A) as a surgical intervention.  The patient's history has been reviewed, patient examined, no change in status, stable for surgery.  I have reviewed the patient's chart and labs.  Questions were answered to the patient's satisfaction.     Pricilla Riffle. Fuller Plan

## 2020-09-20 NOTE — Care Management Important Message (Signed)
Important Message  Patient Details  Name: Bryan Scott MRN: [id number] Date of Birth: [birth year]   Medicare Important Message Given:  Yes     [place] 09/20/2020, 12:23 PM

## 2020-09-21 ENCOUNTER — Encounter (HOSPITAL_COMMUNITY): Payer: Self-pay | Admitting: Family Medicine

## 2020-09-21 DIAGNOSIS — E860 Dehydration: Secondary | ICD-10-CM | POA: Diagnosis not present

## 2020-09-21 DIAGNOSIS — K3184 Gastroparesis: Secondary | ICD-10-CM

## 2020-09-21 DIAGNOSIS — E1143 Type 2 diabetes mellitus with diabetic autonomic (poly)neuropathy: Secondary | ICD-10-CM

## 2020-09-21 DIAGNOSIS — R55 Syncope and collapse: Secondary | ICD-10-CM | POA: Diagnosis not present

## 2020-09-21 DIAGNOSIS — I25810 Atherosclerosis of coronary artery bypass graft(s) without angina pectoris: Secondary | ICD-10-CM | POA: Diagnosis not present

## 2020-09-21 HISTORY — DX: Type 2 diabetes mellitus with diabetic autonomic (poly)neuropathy: E11.43

## 2020-09-21 LAB — CBC
HCT: 45.1 % (ref 39.0–52.0)
Hemoglobin: 15.8 g/dL (ref 13.0–17.0)
MCH: 33.5 pg (ref 26.0–34.0)
MCHC: 35 g/dL (ref 30.0–36.0)
MCV: 95.8 fL (ref 80.0–100.0)
Platelets: 186 10*3/uL (ref 150–400)
RBC: 4.71 MIL/uL (ref 4.22–5.81)
RDW: 13 % (ref 11.5–15.5)
WBC: 9.4 10*3/uL (ref 4.0–10.5)
nRBC: 0 % (ref 0.0–0.2)

## 2020-09-21 LAB — BASIC METABOLIC PANEL
Anion gap: 9 (ref 5–15)
BUN: 13 mg/dL (ref 8–23)
CO2: 20 mmol/L — ABNORMAL LOW (ref 22–32)
Calcium: 9 mg/dL (ref 8.9–10.3)
Chloride: 104 mmol/L (ref 98–111)
Creatinine, Ser: 0.96 mg/dL (ref 0.61–1.24)
GFR, Estimated: >60 mL/min (ref >60)
Glucose, Bld: 191 mg/dL — ABNORMAL HIGH (ref 70–99)
Potassium: 4 mmol/L (ref 3.5–5.1)
Sodium: 133 mmol/L — ABNORMAL LOW (ref 135–145)

## 2020-09-21 LAB — GLUCOSE, CAPILLARY
Glucose-Capillary: 164 mg/dL — ABNORMAL HIGH (ref 70–99)
Glucose-Capillary: 131 mg/dL — ABNORMAL HIGH (ref 70–99)
Glucose-Capillary: 145 mg/dL — ABNORMAL HIGH (ref 70–99)
Glucose-Capillary: 134 mg/dL — ABNORMAL HIGH (ref 70–99)

## 2020-09-21 MED ORDER — SENNA 8.6 MG PO TABS
1.0000 | ORAL_TABLET | Freq: Every day | ORAL | Status: DC
  Administered 2020-09-21 – 2020-09-25 (×3): 8.6 mg via ORAL
  Filled 2020-09-21 (×5): qty 1

## 2020-09-21 MED ORDER — ISOSORBIDE MONONITRATE ER 30 MG PO TB24
30.0000 mg | ORAL_TABLET | Freq: Every day | ORAL | Status: DC
  Administered 2020-09-21 – 2020-09-25 (×5): 30 mg via ORAL
  Filled 2020-09-21 (×5): qty 1

## 2020-09-21 MED ORDER — OXYCODONE HCL 5 MG PO TABS
7.5000 mg | ORAL_TABLET | Freq: Four times a day (QID) | ORAL | Status: DC | PRN
  Administered 2020-09-21 – 2020-09-23 (×8): 7.5 mg via ORAL
  Filled 2020-09-21 (×8): qty 2

## 2020-09-21 MED ORDER — RIVAROXABAN 20 MG PO TABS
20.0000 mg | ORAL_TABLET | Freq: Every day | ORAL | Status: DC
  Administered 2020-09-21 – 2020-09-24 (×4): 20 mg via ORAL
  Filled 2020-09-21 (×4): qty 1

## 2020-09-21 MED ORDER — POLYETHYLENE GLYCOL 3350 17 G PO PACK
17.0000 g | PACK | Freq: Every day | ORAL | Status: DC
  Administered 2020-09-21 – 2020-09-24 (×2): 17 g via ORAL
  Filled 2020-09-21 (×5): qty 1

## 2020-09-21 MED ORDER — CLOPIDOGREL BISULFATE 75 MG PO TABS
75.0000 mg | ORAL_TABLET | Freq: Every morning | ORAL | Status: DC
  Administered 2020-09-21 – 2020-09-25 (×5): 75 mg via ORAL
  Filled 2020-09-21 (×5): qty 1

## 2020-09-21 NOTE — Progress Notes (Addendum)
Progress Note  Patient Name: Bryan Scott Date of Encounter: 09/21/2020  [address] [facility] Cardiologist: follow up with cardiology at [place]   Patient states he continue to feel dizzy when standing up. He is tolerate liquid diet. He reports intermittent nausea and emesis, abdominal pain, reports resolved diarrhea since admission.  He denies any chest pain, chest pressure, heart palpitation, shortness of breath at rest.  Patient states he is glad that his EGD did not reveal any abnormality.   Inpatient Medications    Scheduled Meds: . acetaminophen  650 mg Oral Q6H  . atorvastatin  80 mg Oral Daily  . carvedilol  3.125 mg Oral BID WC  . clopidogrel  75 mg Oral q morning  . ezetimibe  10 mg Oral Daily  . feeding supplement  237 mL Oral TID BM  . gabapentin  100 mg Oral TID  . insulin aspart  0-9 Units Subcutaneous TID WC  . isosorbide mononitrate  30 mg Oral Daily  . lidocaine  1 patch Transdermal Q24H  . metoCLOPramide  10 mg Oral TID AC & HS  . pantoprazole  40 mg Oral Daily  . rivaroxaban  20 mg Oral QAC supper  . sucralfate  1 g Oral QID   Continuous Infusions: . sodium chloride 125 mL/hr at 09/21/20 0300   PRN Meds: ondansetron, oxyCODONE, polyethylene glycol   Vital Signs    Vitals:   09/20/20 1507 09/20/20 1648 09/20/20 2326 09/21/20 0454  BP: (!) 150/83 136/65 (!) 158/73 (!) 150/77  Pulse: 70 82 68 65  Resp: 20 20 19 18   Temp: 97.8 F (36.6 C) 97.9 F (36.6 C) 97.7 F (36.5 C) 98 F (36.7 C)  TempSrc: Oral Oral Oral Oral  SpO2: 97% 96% 99% 96%  Weight:      Height:        Intake/Output Summary (Last 24 hours) at 09/21/2020 0852 Last data filed at 09/21/2020 0631 Gross per 24 hour  Intake 613 ml  Output 2280 ml  Net -1667 ml   Last 3 Weights 09/17/2020  Weight (lbs) 158 lb  Weight (kg) 71.668 kg      Telemetry    N/A  ECG    Sinus rhythm, first degree AV block, no acute ischemic change - Personally Reviewed  Physical Exam   GEN: No  acute distress.  Fatigued. Neck: No JVD, trachea midline.  Ecchymosis of anterior neck noted Cardiac: RRR, no murmurs, rubs, or gallops.  Respiratory: Clear to auscultation bilaterally. GI: Soft, nontender, non-distended  MS: No edema; No deformity. Neuro:  Nonfocal  Psych: Normal affect  Skin: left arm with kerlix dressing in place   Labs    High Sensitivity Troponin:   Recent Labs  Lab 09/17/20 2158 09/18/20 0135  TROPONINIHS 10 8      Chemistry Recent Labs  Lab 09/18/20 0328 09/18/20 1943 09/19/20 0118 09/20/20 0034 09/21/20 0449  NA  --  134* 133* 133* 133*  K  --  3.5 3.1* 3.3* 4.0  CL  --  103 102 103 104  CO2  --  25 26 24  20*  GLUCOSE  --  184* 110* 181* 191*  BUN  --  21 19 15 13   CREATININE  --  1.31* 1.13 0.98 0.96  CALCIUM  --  8.2* 8.3* 8.3* 9.0  PROT 7.0  --  5.5* 6.0*  --   ALBUMIN 3.5 2.8* 2.9* 3.2*  --   AST 20  --  16 19  --  ALT 23  --  16 17  --   ALKPHOS 69  --  59 68  --   BILITOT 2.8*  --  1.8* 1.5*  --   GFRNONAA  --  57* >60 >60 >60  ANIONGAP  --  6 5 6 9      Hematology Recent Labs  Lab 09/19/20 0836 09/19/20 1728 09/21/20 0449  WBC 9.1 8.0 9.4  RBC 4.28 4.42 4.71  HGB 14.5 14.6 15.8  HCT 41.1 41.8 45.1  MCV 96.0 94.6 95.8  MCH 33.9 33.0 33.5  MCHC 35.3 34.9 35.0  RDW 13.2 13.1 13.0  PLT 172 176 186    BNPNo results for input(s): BNP, PROBNP in the last 168 hours.   DDimer No results for input(s): DDIMER in the last 168 hours.   Radiology    DG Forearm Left  Result Date: 09/19/2020 CLINICAL DATA:  Pain following recent fall EXAM: LEFT FOREARM - 2 VIEW COMPARISON:  None. FINDINGS: Frontal and lateral views were obtained. No appreciable fracture or dislocation. Joint spaces appear normal. No erosive change. IMPRESSION: No fracture or dislocation.  No appreciable arthropathy. Electronically Signed   By: Lowella Grip III M.D.   On: 09/19/2020 13:27   DG Humerus Left  Result Date: 09/19/2020 CLINICAL DATA:  Pain  following fall EXAM: LEFT HUMERUS - 2+ VIEW COMPARISON:  May 03, 2012 FINDINGS: Frontal and lateral views were obtained. There is screw and plate fixation through a previous fracture of the proximal left humerus. There is remodeling with bony overgrowth in this area. No acute fracture or dislocation. No appreciable joint space narrowing or erosion. IMPRESSION: Postoperative fixation for prior fracture proximal left humerus with bony overgrowth and remodeling in the proximal left humerus. No acute fracture or dislocation. No appreciable joint space narrowing or erosion. Electronically Signed   By: Lowella Grip III M.D.   On: 09/19/2020 13:26    Cardiac Studies   TTE 09/04/20:  1.Left ventricular ejection fraction, by estimation, is 70 to 75%. The left ventricle has hyperdynamic function. Left ventricular endocardial border not optimally defined to evaluate regional wall motion. Left ventricular diastolic parameters are consistent with Grade I diastolic dysfunction (impaired relaxation). 2. Right ventricular systolic function is normal. The right ventricular size is normal. 3. The mitral valve is grossly normal. Trivial mitral valve regurgitation. No evidence of mitral stenosis. 4. The aortic valve is tricuspid. There is mild calcification of the aortic valve. There is mild thickening of the aortic valve. Aortic valve regurgitation is not visualized. Mild to moderate aortic valve sclerosis/calcification is present, without any evidence of aortic stenosis. 5. The inferior vena cava is normal in size with greater than 50% respiratory variability, suggesting right atrial pressure of 3 mmHg.   Left heart catheretization on1/28/22:  1. Multivessel coronary artery and bypass graft disease, as detailed in Dr. Donivan Scull diagnostic catheterization report. Culprit lesion appears to be 95% stenosis in SVG-RCA. 2. Successful PCI to SVG-RCA using Resolute Onyx 3.5 x 18 mm drug-eluting stent  with 0% residual stenosis and TIMI-3 flow.  Recommendations:  1. Remove right femoral artery sheath 2 hours after discontinuation of bivalirudin. 2. Restart heparin 8 hours after sheath removal. Transition to rivaroxaban tomorrow if no evidence of bleeding/vascular complication. 3. Continue clopidogrel for 12 months (defer aspirin in the setting of aspirin allergy on chronic anticoagulation with rivaroxaban). 4. Aggressive secondary prevention.   Patient Profile     Bryan Scott a 75 y.o.malewith a PMH of CAD s/p CABG in  2006and PCI with DES to SVG-RCAon1/28/2022, Paroxsymal A fib(on Xarelto), type 2 DM, hypertension, hyperlipidemia, carotid artery stenosis, cervical spinal stenosis s/p C5-C7 ACDF, chronic pain syndrome, hx of tobacco use, who presented to the [facility] for syncope and fall at home. Cardiologyis consulted and following for syncope.  Assessment & Plan    Syncope and collapse  CAD with hx of CABG 2006 and PCI 06/2020 - patient presented with 2nd episode of syncope similar to March 2022 hospitalization, after havingpersistentintractable nausea with emesis, diarrhea, and poor PO intake over the past 2 month. History reveals no chest pain or ACS equivalent symptoms. Hs Trop negative x2. EKG showed sinus rhythm with first degree AV block, ventricular rate of 61bpm, andnon-specific T wave abnormalities, no acute changes. Telemetry without acute arrhythmia. Echo from 09/04/20 with EF 70-75%, grade I DD. Suspected clinical presentation is due to orthostasis /dehydration +/- micturition, low suspicion for ischemic process induced syncope - orthostatic VSnegative , BP elevated now, repeat orthostatic VS today  - discontinued telemetry monitor,may repeatEKGPRN - UOP adequate, appears improved hypovolemia, consider reduce IVF rate  - recommend optimize electrolyte deficiency with goal of K >4 and Mag >2  -recommendcontinueGDMT with Coreg(if not hypotensive), Statin,  Zetia - Noted Plavix is on hold since 09/18/20 per GI recommendation for EGD, s/p stent 1/22, patient is high CV risk, please resume ASAP - will order compression stocking today to help orthostatic symptom  Hypertension - BP remains elevated today 150/77 - will resume home meds Imdur today, consider reducing/DC IVF if PO intake adequate   AKI, presumed pre-renal, resolved  Hyponatremia, stable  - history, labs, and clinical exam suggest significant hypovolemia, POA - Cr 1.4 POA, was 1 from last discharge on 09/06/20, now near baseline - resolved with IVF hydration  - avoid nephrotoxic agents and hypotension  - hold metformin  Hypokalemia Hypomagnesemia -continue replete electrolyte to goal PRN  Nausea, vomiting, diarrhea, abdominal pain Indirect hyperbilirubinemia - ongoing GI symptoms for 2 months without improvement - leukocytosis resolved , likely reactive, afebrile -EGD normal. Further workup and management  per GI/primary team    Paroxsymal A fib - currently in SR - CHADSVAS score 5 (age, HTN, CAD, DM) - continue coreg, please resumexarelto  Type 2 DM - Hgb A1C 6.9%, fairly controlled at goal  - hold metformin, agree with insulin SSI in house   HLD - LDL 29 on 08/08/20 , at goal - continue statin and zetia  Protein calorie malnutrition, moderate - reports significant weight loss for the past few month  - agree withnutritionconsult and protein supplement   Chronic pain syndrome - management per primary team   For questions or updates, please contact [facility] Please consult [email or web address] for contact info under        Signed, Margie Billet, NP  09/21/2020, 8:52 AM    Patient seen and examined and agree with Margie Billet, NP as above.  In brief, the patient is a75 y.o.malewith a PMH of CAD s/p CABG in 2006and PCI with DES to SVG-RCAon1/28/2022,paroxsymal A fib(on Xarelto),DMII,HTN,HLD, carotid artery stenosis, cervical spinal  stenosis s/p C5-C7 ACDF, chronic pain syndrome, hx of tobacco use, and frequent admissions for syncope in the setting of dehydration due to nausea/vomiting/diarrhea who returns to the ER with syncope for which Cardiology has been consulted.   Patientwith recurrentsyncope in the setting of nausea, vomiting and poor PO intake. Suspect his recurrent syncopal episode is secondary to orthostasis and dehydration again with low suspicion for ACS or arrhythmia. Trop negative.  ECG with nonspecific ST-T wave changes but no acute ischemia. EDG unrevealing yesterday with no acute pathology. Advancing diet today   GEN: No acute distress. Laying flat in bed   Neck: No JVD Cardiac: RRR, no murmurs, rubs, or gallops.  Respiratory: Clear to auscultation bilaterally. GI: Soft, nontender, non-distended  MS: Scattered ecchymosis. Left arm bandaged Neuro:  Nonfocal  Psych: Normal affect   Plan: -EGD without acute pathology -Management of nausea/vomiting per GI and primary team -Plavix resumed -Cannot tolerate ASA due to allergy -Xarelto resumed -Continue coreg and imdur; orthostatic negative -Continue statin and zetia  Cardiology will sign-off. Will arrange for out-patient follow-up.  Gwyndolyn Kaufman, MD

## 2020-09-21 NOTE — Progress Notes (Signed)
Family Medicine Teaching Service Daily Progress Note Intern Pager: [phone]  Patient name: Bryan Scott Medical record number: [id number] Date of birth: February 07, 1946 Age: 75 y.o. Gender: male  Primary Care Provider: Administration, [facility] Consultants: Cardiology, GI Code Status: Full  Pt Overview and Major Events to Date:  4/4 admitted 4/7 EGD  Assessment and Plan: Mr. Hert is a 75 year old male presenting with nausea, vomiting, abrasions and contusions secondary to syncopal episode.  PMH includes HTN, T2DM, A. fib, CAD s/p CABG 2007 and s/p coronary stent 2002, HLD, spinal stenosis, history of tobacco use.   Syncope Patient reports still a little dizzy when getting up. No syncopal episodes overnight, no falls. -Restart imdur today per cards -Continuous cardiac monitoring -DC IVF -Encourage p.o. intake as tolerated -A.m. labs: CBC, CMP -A.m. EKG -PT OT eval and treat -Up with assistance   Gastroparesis  Nausea and vomiting  S/p EGD 4/7.  Entire EGD normal, abnormalities discovered, no specimens collected. -Full liquid diet, advance as tolerated -Per GI, will continue metoclopramide, Carafate, Protonix -Zofran ODT every 8 as needed -IV fluids with NS at 125 mL/hr - Dietician to counsel regarding best practices for gastroparesis - Likely DC today with GI follow up (although awaiting GI note for today)   CAD s/p CABG 2007  NSTEMI s/p coronary stent 2022  HFrEF  P. Mitralis S/p EGD 4/7. -Continuing home atorvastatin, Imdur -Xarelto, Plavix restarted today -Cardiology consulted, appreciate recommendations   Skin abrasions  bruising on long term AC use -Wound care per nursing     Type 2 DM Glucose last 24 hours 164-191.  5 units sign scale insulin delivered. -Continue sensitive sliding scale insulin -Holding home Jardiance and Metformin   Polycythemia: Improving Hemoglobin today 15.8. -A.m. CBC -Peripheral blood smear review in process, awaiting results    Hypokalemia Potassium 4.0 this morning, no repletion indicated. -A.m. BMP   Hypertension Patient hypertensive overnight with systolics in the 283M.   -Continue home meds of Coreg and Imdur    Chronic pain syndrome  Spinal stenosis  At home on gabapentin 100 mg 3 times daily daily.  Chronic pain likely exacerbated by ecchymosis and swelling on back and arm from fall. -Tylenol 650 mg every 6 -Home gabapentin 100 mg 3 times daily -Oxycodone IR 5 mg every 6 as needed for severe pain (will work on weaning down over next couple days) - Will send home with oxy 5 q6 x 5 day supply -Ice or heat as needed -Lidocaine patch as needed -Offer rib binding elastics for comfort - Encourage ambulation, physical activity   Hyperlipidemia Continue home atorvastatin and Zetia.   FEN/GI: Regular PPx: Plavix, Xarelto   Status is: Inpatient  Remains inpatient appropriate because:Ongoing diagnostic testing needed not appropriate for outpatient work up   Dispo:  Patient From: Home  Planned Disposition: Home  Medically stable for discharge: TBD, awaiting GI note.      Subjective:  Patient laying in bed comfortably, no acute distress. Relays that his nausea is still present, but the medicine given helps prevent vomiting.   Objective: Temp:  [97.5 F (36.4 C)-98.4 F (36.9 C)] 98 F (36.7 C) (04/08 0454) Pulse Rate:  [65-97] 65 (04/08 0454) Resp:  [18-23] 18 (04/08 0454) BP: (106-184)/(48-84) 150/77 (04/08 0454) SpO2:  [96 %-99 %] 96 % (04/08 0454) Physical Exam: General: awake, alert, oriented, no acute distress Cardiovascular: RRR, no murmur Respiratory: CTAB Abdomen: BS+, diffuse TTP Extremities: moving all spontaneously, no BLE edema  Laboratory: Recent Labs  Lab 09/19/20  [id number] 09/19/20 1728 09/21/20 0449  WBC 9.1 8.0 9.4  HGB 14.5 14.6 15.8  HCT 41.1 41.8 45.1  PLT 172 176 186   Recent Labs  Lab 09/18/20 0328 09/18/20 1943 09/19/20 0118 09/20/20 0034 09/21/20 0449   NA  --    < > 133* 133* 133*  K  --    < > 3.1* 3.3* 4.0  CL  --    < > 102 103 104  CO2  --    < > 26 24 20*  BUN  --    < > 19 15 13   CREATININE  --    < > 1.13 0.98 0.96  CALCIUM  --    < > 8.3* 8.3* 9.0  PROT 7.0  --  5.5* 6.0*  --   BILITOT 2.8*  --  1.8* 1.5*  --   ALKPHOS 69  --  59 68  --   ALT 23  --  16 17  --   AST 20  --  16 19  --   GLUCOSE  --    < > 110* 181* 191*   < > = values in this interval not displayed.    Imaging/Diagnostic Tests: None last 24 hours.   Ezequiel Essex, MD 09/21/2020, 7:58 AM PGY-1, [place] Intern pager: [phone], text pages welcome

## 2020-09-21 NOTE — Progress Notes (Addendum)
Nutrition Follow-up  DOCUMENTATION CODES:   Non-severe (moderate) malnutrition in context of acute illness/injury  INTERVENTION:    Gastroparesis diet education provided  Continue Ensure Enlive po TID, each supplement provides 350 kcal and 20 grams of protein  NUTRITION DIAGNOSIS:   Moderate Malnutrition related to acute illness (acute GI issues causing vomiting, work-up ongoing) as evidenced by mild fat depletion,mild muscle depletion,moderate muscle depletion.  Ongoing  GOAL:   Patient will meet greater than or equal to 90% of their needs   Progressing  MONITOR:   PO intake,Supplement acceptance,Labs  REASON FOR ASSESSMENT:   Malnutrition Screening Tool    ASSESSMENT:   75 yo male admitted S/P syncope episode. PMH includes HTN, DM-2, A fib, CAD, HLD, spinal stenosis, tobacco use.   S/P EGD 4/7 with no problems identified. Reglan added.  Received consult for gastroparesis diet education. Provided patient with "Gastroparesis Nutrition Therapy" handout and reviewed limiting fiber and high fat foods. Encourage small, frequent meals. Patient appreciative of the information provided. He plans to share this information with his family to help him.   Labs reviewed.  Medications reviewed.  Patient has started on a full liquid diet.  He reports good tolerance as long as he receives "a pill" before he eats.  Diet Order:   Diet Order            Diet full liquid Room service appropriate? Yes; Fluid consistency: Thin  Diet effective now                 EDUCATION NEEDS:   Not appropriate for education at this time  Skin:  Skin Assessment: Reviewed RN Assessment  Last BM:  4/4  Height:   Ht Readings from Last 1 Encounters:  09/17/20 6' (1.829 m)    Weight:   Wt Readings from Last 1 Encounters:  09/17/20 71.7 kg    Ideal Body Weight:  80.9 kg  BMI:  Body mass index is 21.43 kg/m.  Estimated Nutritional Needs:   Kcal:  2100-2300  Protein:   100-115 gm  Fluid:  >/= 2 L    Lucas Mallow, RD, LDN, CNSC Please refer to Amion for contact information.

## 2020-09-21 NOTE — Progress Notes (Signed)
Physical Therapy Treatment Patient Details Name: Bryan Scott MRN: [id number] DOB: June 25, 1945 Today's Date: 09/21/2020    History of Present Illness Bryan Scott is a 75 y/o male who presented to ED on 09/17/20 with lacerations on his chin and right UE following a fall in his bathroom after a syncopal episode. CT and DG was unremarkable for acute changes. PMH includes HTN, T2DM< A-fib, CAD s/p CABG and s/p coronary stent, HLD, spinal stenosis, tobacco use, and potential intraabdominal cancer/mass.    PT Comments    Pt received sitting in recliner, willing to participate in PT. Needed less assistance today, generally min Ax1. Fatigued quickly upon standing due to dizziness, knees began to buckle so pt returned to sitting. Pt endorsed constant dizziness. Vestibular screen completed. Pt symptomatic with saccades, VORx1, and head impulse test. Impaired convergence, far outside normal range. Suspect vestibular involvement. Gaze stability given to pt as HEP. Pt would benefit from continued PT to address vestibular system integrity, decrease risk for falls, and increase independency. Will continue to follow acutely.   Follow Up Recommendations  Home health PT;Other (comment);Supervision for mobility/OOB (Pt declining SNF)     Equipment Recommendations  3in1 (PT);Wheelchair (measurements PT);Wheelchair cushion (measurements PT);Rolling walker with 5" wheels;Hospital bed    Recommendations for Other Services       Precautions / Restrictions Precautions Precautions: Fall Precaution Comments: monitor BP Restrictions Weight Bearing Restrictions: No    Mobility  Bed Mobility Overal bed mobility: Needs Assistance Bed Mobility: Sit to Supine     Supine to sit: Supervision;HOB elevated          Transfers Overall transfer level: Needs assistance Equipment used: Rolling walker (2 wheeled) Transfers: Sit to/from Stand Sit to Stand: Min assist Stand pivot transfers: Min assist       General  transfer comment: Min A for steadying, good carryover with handplacement  Ambulation/Gait             General Gait Details: unable   Stairs             Wheelchair Mobility    Modified Rankin (Stroke Patients Only)       Balance Overall balance assessment: Needs assistance Sitting-balance support: Feet supported;Bilateral upper extremity supported Sitting balance-Leahy Scale: Fair     Standing balance support: Bilateral upper extremity supported;During functional activity Standing balance-Leahy Scale: Poor Standing balance comment: reliant on UE support                            Cognition Arousal/Alertness: Awake/alert Behavior During Therapy: WFL for tasks assessed/performed Overall Cognitive Status: Within Functional Limits for tasks assessed                                 General Comments: Tangential answers      Exercises      General Comments General comments (skin integrity, edema, etc.): Pt able to scoot at supervision level. Pt with constant dizziness throughout session, worse with standing. Assess BP for orthostatics and no significant findings.   Vestibular screen Smooth pursuits: choppy moving horizontally and vertically at end range, noted saccadic intrusions  Saccades: x14 horizontal, increased symptoms, fairly smooth eye movement  Convergence: pt reported seeing doubled at approximately half his arm's length, ~30cm  VORx1: vertical and horizontal, x20 seconds each, symptomatic vertical > horizontal  Head impulse test: increase symptoms in both directions, unable to keep eyes on  PT's nose, pt guarding reporting that pt has a history of cervical surgeries and lingering pain  VOR cancellation: WFL, no increase in symptoms, pt with some difficulty keeping eyes on target but unclear if limited cervical ROM had effect on test          Pertinent Vitals/Pain Pain Assessment: Faces Faces Pain Scale: Hurts little  more Pain Location: lower back, B LE Pain Descriptors / Indicators: Constant;Grimacing Pain Intervention(s): Monitored during session;Repositioned    Home Living                      Prior Function            PT Goals (current goals can now be found in the care plan section) Acute Rehab PT Goals Patient Stated Goal: return home, get stomach issues figured out PT Goal Formulation: With patient Time For Goal Achievement: 10/02/20 Potential to Achieve Goals: Good Progress towards PT goals: Progressing toward goals    Frequency    Min 3X/week      PT Plan Current plan remains appropriate    Co-evaluation              AM-PAC PT "6 Clicks" Mobility   Outcome Measure  Help needed turning from your back to your side while in a flat bed without using bedrails?: A Little Help needed moving from lying on your back to sitting on the side of a flat bed without using bedrails?: A Little Help needed moving to and from a bed to a chair (including a wheelchair)?: A Little Help needed standing up from a chair using your arms (e.g., wheelchair or bedside chair)?: A Little Help needed to walk in hospital room?: A Lot Help needed climbing 3-5 steps with a railing? : Total 6 Click Score: 15    End of Session Equipment Utilized During Treatment: Gait belt Activity Tolerance: Patient tolerated treatment well;Other (comment) (Limited by dizziness) Patient left: with call bell/phone within reach;in bed;with bed alarm set Nurse Communication: Mobility status PT Visit Diagnosis: Unsteadiness on feet (R26.81);Muscle weakness (generalized) (M62.81);History of falling (Z91.81)     Time:  -     Charges:                        Rosita Kea, SPT

## 2020-09-22 DIAGNOSIS — K3184 Gastroparesis: Secondary | ICD-10-CM | POA: Diagnosis not present

## 2020-09-22 DIAGNOSIS — E1143 Type 2 diabetes mellitus with diabetic autonomic (poly)neuropathy: Secondary | ICD-10-CM | POA: Diagnosis not present

## 2020-09-22 LAB — GLUCOSE, CAPILLARY
Glucose-Capillary: 161 mg/dL — ABNORMAL HIGH (ref 70–99)
Glucose-Capillary: 183 mg/dL — ABNORMAL HIGH (ref 70–99)
Glucose-Capillary: 165 mg/dL — ABNORMAL HIGH (ref 70–99)
Glucose-Capillary: 149 mg/dL — ABNORMAL HIGH (ref 70–99)

## 2020-09-22 LAB — BASIC METABOLIC PANEL
Anion gap: 9 (ref 5–15)
BUN: 13 mg/dL (ref 8–23)
CO2: 22 mmol/L (ref 22–32)
Calcium: 9.3 mg/dL (ref 8.9–10.3)
Chloride: 103 mmol/L (ref 98–111)
Creatinine, Ser: 0.89 mg/dL (ref 0.61–1.24)
GFR, Estimated: >60 mL/min (ref >60)
Glucose, Bld: 150 mg/dL — ABNORMAL HIGH (ref 70–99)
Potassium: 3.7 mmol/L (ref 3.5–5.1)
Sodium: 134 mmol/L — ABNORMAL LOW (ref 135–145)

## 2020-09-22 NOTE — Progress Notes (Addendum)
Physical Therapy Treatment Patient Details Name: Bryan Scott MRN: [id number] DOB: 1946/02/26 Today's Date: 09/22/2020    History of Present Illness Bryan Scott is a 75 y/o male who presented to ED on 09/17/20 with lacerations on his chin and right UE following a fall in his bathroom after a syncopal episode. CT and DG was unremarkable for acute changes. PMH includes HTN, T2DM< A-fib, CAD s/p CABG and s/p coronary stent, HLD, spinal stenosis, tobacco use, and potential intraabdominal cancer/mass.    PT Comments    Slow progression, however tolerated short distance ambulation in room today with close guard. Pt requires assistance to stand due to weakness and instability. Symptomatic upon sitting and standing however no significant drop in BP (see orthostatics - slight 10pt. Drop after 3 min standing but declined symptoms at 3 min mark.) Limited assistance at home - pt states he only has sister to help him at home now and is concerned she will not be able to physically assist him. He is concerned about falling, and I agree he would benefit from short term SNF to improve functional independence prior to returning home. (originally declined snf per notes, however now amenable, and actually requests.) If not an option, consider maximizing HHPT services and supervision. Alerted nurse of change in recs and pt mobility level. Patient will continue to benefit from skilled physical therapy services to further improve independence with functional mobility.    Follow Up Recommendations  SNF     Equipment Recommendations  None recommended by PT (TBD next venue of care)    Recommendations for Other Services       Precautions / Restrictions Precautions Precautions: Fall Precaution Comments: monitor BP Restrictions Weight Bearing Restrictions: No    Mobility  Bed Mobility Overal bed mobility: Needs Assistance Bed Mobility: Sit to Supine;Supine to Sit     Supine to sit: Supervision;HOB elevated Sit to  supine: Min guard   General bed mobility comments: Min guard to sit EOB, very effortful for patient, cues for technique and use of handrail.    Transfers Overall transfer level: Needs assistance Equipment used: Rolling walker (2 wheeled) Transfers: Sit to/from Stand   Stand pivot transfers: Min assist       General transfer comment: Attempted to stand without assist and looses balance posteriorly back onto bed. Educated on technique, hand placement, and safety. Min assist for boost and balance, leaning posteriorly, knees flexed.  Ambulation/Gait Ambulation/Gait assistance: Min guard Gait Distance (Feet): 20 Feet Assistive device: Rolling walker (2 wheeled) Gait Pattern/deviations: Step-to pattern;Decreased stride length;Trunk flexed Gait velocity: slow Gait velocity interpretation: <1.31 ft/sec, indicative of household ambulator General Gait Details: Tolerated short distance in room. Pt very anxious. Educated on proper DME use with rolling walker. Cues for gait symmetry and awareness, easily fatigued and needed to return to bed to sit before leaving room. No overt buckling noted. RW adjusted appropriately.   Stairs             Wheelchair Mobility    Modified Rankin (Stroke Patients Only)       Balance Overall balance assessment: Needs assistance Sitting-balance support: Feet supported;No upper extremity supported Sitting balance-Leahy Scale: Fair     Standing balance support: Bilateral upper extremity supported;During functional activity Standing balance-Leahy Scale: Poor Standing balance comment: reliant on UE support                            Cognition Arousal/Alertness: Awake/alert Behavior During Therapy: [facility]  for tasks assessed/performed Overall Cognitive Status: Within Functional Limits for tasks assessed                                        Exercises      General Comments General comments (skin integrity, edema, etc.):  See orthostatics- also assessed peripheral vestibular system, denies vertigo, all symptoms described as prodormal syncopal. horizontal roll test negative, dix hallpike not tolerated 2/2 neck stiffness and back pain.      Pertinent Vitals/Pain Pain Assessment: 0-10 Pain Score: 6  Pain Location: lower back, B LE, LUE Pain Descriptors / Indicators: Constant;Grimacing Pain Intervention(s): Monitored during session;Repositioned    Home Living                      Prior Function            PT Goals (current goals can now be found in the care plan section) Acute Rehab PT Goals Patient Stated Goal: Get stronger before returning home PT Goal Formulation: With patient Time For Goal Achievement: 10/02/20 Potential to Achieve Goals: Good Progress towards PT goals: Progressing toward goals    Frequency    Min 3X/week      PT Plan Discharge plan needs to be updated;Equipment recommendations need to be updated    Co-evaluation              AM-PAC PT "6 Clicks" Mobility   Outcome Measure  Help needed turning from your back to your side while in a flat bed without using bedrails?: A Little Help needed moving from lying on your back to sitting on the side of a flat bed without using bedrails?: A Little Help needed moving to and from a bed to a chair (including a wheelchair)?: A Little Help needed standing up from a chair using your arms (e.g., wheelchair or bedside chair)?: A Little Help needed to walk in hospital room?: A Little Help needed climbing 3-5 steps with a railing? : Total 6 Click Score: 16    End of Session Equipment Utilized During Treatment: Gait belt Activity Tolerance: Patient limited by fatigue Patient left: in bed;with call bell/phone within reach;with bed alarm set Nurse Communication: Mobility status;Other (comment) (change in recs) PT Visit Diagnosis: Unsteadiness on feet (R26.81);Muscle weakness (generalized) (M62.81);History of falling  (Z91.81)     Time: 8144-8185 PT Time Calculation (min) (ACUTE ONLY): 43 min  Charges:  $Gait Training: 8-22 mins $Therapeutic Exercise: 23-37 mins                     Elayne Snare, PT, DPT   Ellouise Newer 09/22/2020, 3:14 PM

## 2020-09-22 NOTE — Care Management (Cosign Needed)
    Durable Medical Equipment  (From admission, onward)         Start     Ordered   09/22/20 1501  For home use only DME Hospital bed  Once       Question Answer Comment  Length of Need 6 Months   Patient has (list medical condition): spinal stenosis   The above medical condition requires: Patient requires the ability to reposition frequently   Head must be elevated greater than: 30 degrees   Bed type Semi-electric   Support Surface: Gel Overlay      09/22/20 [place], RN, BSN  Trauma/Neuro ICU Case Manager [phone]

## 2020-09-22 NOTE — TOC Progression Note (Signed)
Transition of Care [facility]) - Progression Note    Patient Details  Name: Bryan Scott MRN: [id number] Date of Birth: 17-Apr-1946  Transition of Care [facility]) CM/SW Contact  Ella Bodo, RN Phone Number: 09/22/2020, 3:32 PM  Clinical Narrative:   Pt for possible dc home tomorrow, per MD.  PTA, pt lives alone with some support from his sister.  PT/OT recommending SNF/24h assistance at dc, but pt is adamant about going back home with [facility] care.  He is active with [facility] currently; notified [facility] of possible dc tomorrow with new orders.  Pt also requests a hospital bed for home; referral to [place] for DME.  He denies need for 3 in 1 BSC.        Expected Discharge Plan: [place] Barriers to Discharge: Continued Medical Work up  Expected Discharge Plan and Services Expected Discharge Plan: [place]   Discharge Planning Services: CM Consult Post Acute Care Choice: University arrangements for the past 2 months: Single Family Home                 DME Arranged: Hospital bed DME Agency: [employer] Date DME Agency Contacted: 09/22/20 Time DME Agency Contacted: [id number] Representative spoke with at DME Agency: [place]: RN,PT,OT,Nurse's Aide,Social Work [facility] Agency: [facility] Date [place]: 09/22/20 Time [place]: 1430 Representative spoke with at [place]: [place] ([place]) Interventions    Readmission Risk Interventions No flowsheet data found.  Reinaldo Raddle, RN, BSN  Trauma/Neuro ICU Case Manager [phone]

## 2020-09-22 NOTE — Plan of Care (Signed)

## 2020-09-22 NOTE — Progress Notes (Signed)
Family Medicine Teaching Service Daily Progress Note Intern Pager: [phone]  Patient name: Bryan Scott Medical record number: [id number] Date of birth: 06/03/1946 Age: 75 y.o. Gender: male  Primary Care Provider: [facility], [facility] Consultants: Cardiology, GI Code Status: Full  Pt Overview and Major Events to Date:  4/4 admitted  4/7 EGD  Assessment and Plan: Bryan Scott is a 75 year old male presenting with nausea, vomiting, abrasions and contusions secondary to syncopal episode.  PMH includes HTN, T2DM, A. fib, CAD s/p CABG 2007 and s/p coronary stent 2002, HLD, spinal stenosis, history of tobacco use.   Syncope Dizziness with ambulation still present. Patient reports feeling weak and not ready to go home today.  -Continue imdur -Continuous cardiac monitoring -Encourage p.o. intake as tolerated -A.m. labs: CBC, CMP -PT OT eval and treat -Up with assistance - Will consult TOC for DME and HH coordination needs   Gastroparesis  Nausea and vomiting  Normal EGD 4/7. Nausea still present, reports meds keep him from vomiting.  -Full liquid diet, advance as tolerated -Per GI, will continue metoclopramide, Carafate, Protonix -Zofran ODT every 8 as needed - Dietician to counsel regarding best practices for gastroparesis   CAD s/p CABG 2007  NSTEMI s/p coronary stent 2022  HFrEF  P. Mitralis -Continuing home atorvastatin, Imdur -Xarelto, Plavix restarted 4/8 -Cardiology consulted, appreciate recommendations   Skin abrasions  bruising on long term AC use -Wound care per nursing     Type 2 DM Glucose last 24 hours 150-161.  4 units insulin aspart last 24 hours sliding-scale protocol -Continue sensitive sliding scale insulin -Holding home Jardiance and Metformin, will restart on discharge    Hypokalemia Potassium 3.7 this morning, will replete with 40 mEq Imdur. -A.m. BMP   Hypertension Patient intermittently hypertensive overnight with systolics 010X-323F.    -Continue home meds of Coreg and Imdur    Chronic pain syndrome  Spinal stenosis  At home on gabapentin 100 mg 3 times daily daily.  Chronic pain likely exacerbated by ecchymosis and swelling on back and arm from fall. -Tylenol 650 mg every 6 -Home gabapentin 100 mg 3 times daily -Oxycodone IR 5 mg every 6 as needed for severe pain (will work on weaning down over next couple days) - Will send home with oxy 5 q6 x 5 day supply -Ice or heat as needed -Lidocaine patch as needed -Offer rib binding elastics for comfort - Encourage ambulation, physical activity   Hyperlipidemia Continue home atorvastatin and Zetia.    FEN/GI: Regular PPx: Plavix, Xarelto   Status is: Inpatient  Remains inpatient appropriate because:safe for DC today   Dispo:  Patient From: Home  Planned Disposition: Home  Medically stable for discharge: No      Subjective:  Patient sitting on the edge of the bed. Reports trying to sit up and move around to help with pain. Back pain "just about killing me". Will try to get up to chair in a little bit with help of nurse.   Objective: Temp:  [97.7 F (36.5 C)-98.1 F (36.7 C)] 97.7 F (36.5 C) (04/09 0522) Pulse Rate:  [69-77] 77 (04/09 0522) Resp:  [18-19] 18 (04/09 0522) BP: (100-161)/(72-80) 161/78 (04/09 0522) SpO2:  [95 %-98 %] 96 % (04/09 0522)  Physical Exam: General: awake, alert, oriented, mild distress Respiratory: unlabored respirations, no increased WOB Abdomen: right flank ecchymosis now extended laterally in dependent manner, still exquisitely TTP  Extremities: moving all spontaneously  Laboratory: Recent Labs  Lab 09/19/20 0836 09/19/20 1728 09/21/20 0449  WBC 9.1 8.0 9.4  HGB 14.5 14.6 15.8  HCT 41.1 41.8 45.1  PLT 172 176 186   Recent Labs  Lab 09/18/20 0328 09/18/20 1943 09/19/20 0118 09/20/20 0034 09/21/20 0449 09/22/20 0318  NA  --    < > 133* 133* 133* 134*  K  --    < > 3.1* 3.3* 4.0 3.7  CL  --    < > 102 103  104 103  CO2  --    < > 26 24 20* 22  BUN  --    < > 19 15 13 13   CREATININE  --    < > 1.13 0.98 0.96 0.89  CALCIUM  --    < > 8.3* 8.3* 9.0 9.3  PROT 7.0  --  5.5* 6.0*  --   --   BILITOT 2.8*  --  1.8* 1.5*  --   --   ALKPHOS 69  --  59 68  --   --   ALT 23  --  16 17  --   --   AST 20  --  16 19  --   --   GLUCOSE  --    < > 110* 181* 191* 150*   < > = values in this interval not displayed.    Imaging/Diagnostic Tests: None last 24 hours.   Bryan Essex, MD 09/22/2020, 10:20 AM PGY-1, [place] Intern pager: [phone], text pages welcome

## 2020-09-22 NOTE — Discharge Summary (Signed)
[facility] Discharge Summary  Patient name: Bryan Scott Medical record number: [id number] Date of birth: Feb 19, 1946 Age: 75 y.o. Gender: male Date of Admission: 09/17/2020  Date of Discharge: 09/25/2020 Admitting Physician: Donney Dice, DO  Primary Care Provider: [facility] [place] Consultants: Cardiology, GI  Indication for Hospitalization: syncope, nausea, vomiting  Discharge Diagnoses/Problem List:  Syncope Skin abrasions 2/2 fall from syncopal episode  Gastroparesis  CAD s/p CABG 2007 NSTEMI s/p coronary stent 2022 HFrEF P. mitralis Type 2 DM HTN Chronic pain syndrome Spinal stenosis  Disposition: SNF  Discharge Condition: Medically stable   Discharge Exam:  Temp:  [97.6 F (36.4 C)-99 F (37.2 C)] 98.1 F (36.7 C) (04/12 0439) Pulse Rate:  [63-80] 64 (04/12 0439) Resp:  [16-18] 16 (04/12 0439) BP: (107-148)/(64-87) 114/75 (04/12 0439) SpO2:  [95 %-98 %] 96 % (04/12 0439) Weight:  [79.7 kg] 79.7 kg (04/12 0439) Physical Exam: General: Patient well-appearing, sitting in the chair, in no acute distress. Cardiovascular: RRR, no murmurs or gallops auscultated Respiratory: CTAB, no rales or rhonchi noted Abdomen: soft, nontender, BS+ MSK: improved ecchymosis of right thoracic area lateral to spine  Extremities: radial and distal pulses strong and equal bilaterally, no LE edema noted bilaterally Psych: mood appropriate, very pleasant   Brief Hospital Course:  Mr. Bryan Scott is a 75 year old male presenting with nausea, vomiting, abrasions and contusions secondary to syncopal episode. PMH includes HTN, T2DM, A. fib, CAD s/p CABG 2007 and s/p coronary stent 2002, HLD, spinal stenosis, history of tobacco use.  Syncope 2/2 dehydration Patient presents with syncopal episode during micturition after a day of vomiting and diarrhea. Chart review complicated by care at multiple facilities and duplicate charts, marked for merging. On presentation,  patient vitals stable with labs notable for Na 131, K 3, glucose 162, leukocytosis of 15.9, troponin flat and UA pending. In the ED, given percocet along with 1L NS bolus and IV zofran 4 mg. CT head w/o contrast unremarkable, no acute intracranial abnormality noted. Shoulder XR demonstrated no acute osseous abnormality. Patient has had history of recurrent falls with 3 falls in the past 2 months. No bony abnormalities or fractures noted on extensive X-ray imaging.  Orthostatics positive on admission. Patient monitored on telemetry. EKG demonstrated NSR with QTc 463, no ST elevation. Syncope improved with improved hydration and control of nausea and vomiting. Nausea and vomiting resolved at time of discharge but patient discharged with zofran and Reglan. Patient discharged on regimen discussed below in gastroparesis section.   Gastroparesis  Concern for Intra-Abdominal Process Continued nausea throughout hospitalization.  Lipase normal. Extensive work-up previously at outside hospitals with outpatient GI: See note from Dr. Mallie Scott 4/5 at 10:31 AM for summary of all studies found per chart review today.  Per GI note, apparently MRCP in Jan 2022 concerning for pancreatic cancer; ERCP recommended but patient on plavix at the time for CAD as discussed below. Later note per GI Dr. 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 GI consulted here at [facility]. EGD performed 4/7 was unremarkable, no masses, erosions, or abnormalities found. Patient discharged on protonix 40 mg, reglan 10 mg TID before meals and qHS, and sucralfate 1g QID.  CAD s/p CABG 2007  NSTMEI s/p stent  2022 Plavix and Xarelto  held for EGD procedure, restarted next day. High risk patient, given new stent in January 2022. Discussed risks and benefits with cardiologist and patient. Shared decision making used. Will recommend follow up with cardiologist for monitoring.   Pain from ecchymoses, swelling 2/2 fall from syncope  Chronic pain  Spinal Stenosis At home on gabapentin 100 mg 3 times daily daily. Chronic pain likely exacerbated by ecchymosis and swelling on back and arm from fall. Pain controlled during hospitalization with home gabapentin, tylenol 650 mg q6 hours, and oxycodone IR. Oxycodone initially given IR 10 mg q6 PRN, weaned down to 5 mg oxycodone IR q6 on day of discharge. Patient sent home with script for 5 days worth of oxycodone IR 5 mg q6 PRN with expectation of gradually weaning off. This was discussed with patient.   All other issues chronic and stable.   Issues for Follow Up:  1. Syncope: Follow up repeat episodes. Hydration improved while admitted.  2. Nausea and vomiting: Patient counseled on gastroparesis eating and foods. Re-enforce education and adjust medications as necessary.  3. Will need GI follow up for gastroparesis and new medications.  4. Will need cardiology follow up for holding Plavix and Xarelto for EGD in context of new stent a couple months ago. Plavix/Xarelto restarted next day after procedure.  5. Chronic pain: Given 5 day supply of oxycodone 5 mg q6 PRN for acute pain from fall and exacerbation of chronic pain. Patient understands this is for gradual tapering. Recommend increasing movement and physical activity as able.  6. Consider decreasing or stop Reglan after 1 month.   Significant Procedures: 4/7: EGD performed   Significant Labs and Imaging:  Recent Labs  Lab 09/21/20 0449 09/24/20 0338 09/25/20 0140  WBC 9.4 7.6 6.6  HGB 15.8 16.2 15.1  HCT 45.1 45.6 42.5  PLT 186 162 160   Recent Labs  Lab 09/18/20 1943 09/19/20 0118 09/19/20 0836 09/20/20 0034  09/21/20 0449 09/22/20 0318 09/23/20 0918 09/23/20 1108 09/24/20 0338 09/25/20 0140  NA 134* 133*  --  133*   < > 134* 128* 132* 132* 132*  K 3.5 3.1*  --  3.3*   < > 3.7 5.5* 3.4* 3.3* 3.5  CL 103 102  --  103   < > 103 99 100 101 102  CO2 25 26  --  24   < > 22 21* 23 24 23   GLUCOSE 184* 110*  --  181*   < > 150* 197* 171* 158* 233*  BUN 21 19  --  15   < > 13 14 12 16 17   CREATININE 1.31* 1.13  --  0.98   < > 0.89 0.93 0.95 1.12 1.11  CALCIUM 8.2* 8.3*  --  8.3*   < > 9.3 9.0 9.2 9.2 9.1  MG  --   --  1.8  --   --   --   --   --   --   --   PHOS 2.7  --   --   --   --   --   --   --   --   --   ALKPHOS  --  59  --  68  --   --   --   --   --   --   AST  --  16  --  19  --   --   --   --   --   --  ALT  --  16  --  17  --   --   --   --   --   --   ALBUMIN 2.8* 2.9*  --  3.2*  --   --   --   --   --   --    < > = values in this interval not displayed.    RIGHT SHOULDER - 2+ VIEW 09/17/2020 There is no evidence of fracture or dislocation. There is diffuse osteopenia. Moderate AC and glenohumeral joint osteoarthritis is seen with joint space loss. Mild overlying soft tissue swelling is Seen.  RIGHT HUMERUS - 2+ VIEW 09/17/2020 Moderate to advanced degenerative changes in the right [facility] joint with joint space narrowing and spurring. Early degenerative changes in the glenohumeral joint. No acute bony abnormality. Specifically, no fracture, subluxation, or dislocation.  LUMBAR SPINE - 2-3 VIEW 09/17/2020 Partial fusion across the L3-4, L4-5 and L5-S1 disc spaces. Normal alignment. Moderate to advanced degenerative disc and facet disease diffusely. No fracture.  PELVIS - 1-2 VIEW 09/17/2020 Symmetric degenerative changes in the hips. Degenerative changes in the visualized lower lumbar spine. No acute bony abnormality. Specifically, no fracture, subluxation, or dislocation.  CHEST  1 VIEW 09/17/2020 Prior CABG. Heart and mediastinal contours are within normal limits. No focal  opacities or effusions. No acute bony abnormality. No visible displaced rib fracture or pneumothorax.  LEFT HUMERUS - 2+ VIEW 09/19/2020 Frontal and lateral views were obtained. There is screw and plate fixation through a previous fracture of the proximal left humerus. There is remodeling with bony overgrowth in this area. No acute fracture or dislocation. No appreciable joint space narrowing or Erosion.  LEFT FOREARM - 2 VIEW 09/19/2020 Frontal and lateral views were obtained. No appreciable fracture or dislocation. Joint spaces appear normal. No erosive change.  CT HEAD WITHOUT CONTRAST 09/17/2020 Brain: No acute intracranial abnormality. Specifically, no hemorrhage, hydrocephalus, mass lesion, acute infarction, or significant intracranial injury. Vascular: No hyperdense vessel or unexpected calcification. Skull: No acute calvarial abnormality. Sinuses/Orbits: No acute findings  CTA ABDOMEN AND PELVIS WITHOUT AND WITH CONTRAST 09/18/2020 VASCULAR Aorta: Atherosclerotic changes are noted without evidence for an aneurysm. Celiac: There is variant celiac artery anatomy. The common hepatic artery arises directly from the aorta. There is significant narrowing of the origin of the common hepatic artery. The splenic artery arises directly from the aorta. There is no significant narrowing. SMA: There is mild-to-moderate narrowing involving the SMA. Renals: There is mild narrowing of the left renal artery. There is mild narrowing of the right renal artery. IMA: There is moderate narrowing at the origin of the IMA. Inflow: There is a right common iliac artery stent that is patent. There is mild narrowing of the distal right common iliac artery. Proximal Outflow: Bilateral common femoral and visualized portions of the superficial and profunda femoral arteries are patent without evidence of aneurysm, dissection, vasculitis or significant stenosis. Veins: No obvious venous abnormality within  the limitations of this arterial phase study. Review of the MIP images confirms the above findings. NON-VASCULAR Lower chest: There is atelectasis at the lung bases.The heart size is mildly enlarged. There is reflux of contrast into the IVC. Hepatobiliary: The liver is normal. Normal gallbladder.There is no biliary ductal dilation. Pancreas: The pancreatic duct is slightly prominent. Spleen: Unremarkable. Adrenals/Urinary Tract: --Adrenal glands: Unremarkable. --Right kidney/ureter: Again noted is a nonobstructing stone in the right kidney measuring approximately 1.4 cm. There is no hydronephrosis. --Left kidney/ureter: No hydronephrosis or radiopaque kidney stones. --Urinary  bladder: Unremarkable. Stomach/Bowel: --Stomach/Duodenum: There is hyperdense material within stomach. --Small bowel: Unremarkable. --Colon: Unremarkable. --Appendix: Normal. Vascular/Lymphatic: Atherosclerotic calcification is present within the non-aneurysmal abdominal aorta, without hemodynamically significant stenosis. There is mild-to-moderate narrowing of the SMA both in the mid and distal sections. There is mild narrowing of the left renal artery. --No retroperitoneal lymphadenopathy. --No mesenteric lymphadenopathy. --No pelvic or inguinal lymphadenopathy. Reproductive: Unremarkable Other: No ascites or free air. The abdominal wall is normal. Musculoskeletal. There are advanced degenerative changes throughout the lumbar spine. No acute fracture. IMPRESSION: 1. Exam is significantly limited by the lack of a noncontrast phase and a the late venous phase. 2. While there is no definite active arterial bleeding, there is hyperdense material within the stomach which is nonspecific. This could represent extravasated contrast or hyperdense food material. If there is clinical concern for ongoing GI bleeding, a repeat study should be performed with a noncontrast, arterial, and venous phase. 3.  Nonobstructing right-sided nephrolithiasis. 4. Chronic findings as detailed above. 5.  Aortic Atherosclerosis (ICD10-I70.0).  EGD 09/20/2020 Findings: The entire examined stomach was normal. The duodenal bulb and second portion of the duodenum were normal. - Normal esophagus. - Normal stomach. - Normal duodenal bulb and second portion of the duodenum. - No specimens collected. Impression: - Return patient to hospital ward for ongoing care. - Full liquid diet today and advance slowly as tolerated. - Continue present medications including metoclopramide 10 mg ac & hs for now. - If nausea, vomiting persist change Zofran from tid prn to tid - OK to resume Plavix (clopidogrel) tomorrow and Xarelto (rivaroxaban  Results/Tests Pending at Time of Discharge: None  Discharge Medications:  Allergies as of 09/25/2020      Reactions   Aspirin Swelling   Lisinopril Anaphylaxis      Medication List    STOP taking these medications   EX-LAX PO     TAKE these medications   acetaminophen 325 MG tablet Commonly known as: TYLENOL Take 2 tablets (650 mg total) by mouth every 6 (six) hours.   atorvastatin 80 MG tablet Commonly known as: LIPITOR Take 80 mg by mouth daily.   carvedilol 3.125 MG tablet Commonly known as: COREG Take 3.125 mg by mouth 2 (two) times daily.   clopidogrel 75 MG tablet Commonly known as: PLAVIX Take 75 mg by mouth every morning.   ENSURE ORIGINAL PO Take 237 mLs by mouth every evening. strawberry   ezetimibe 10 MG tablet Commonly known as: ZETIA Take 10 mg by mouth daily.   gabapentin 100 MG capsule Commonly known as: NEURONTIN Take 100 mg by mouth 3 (three) times daily.   isosorbide mononitrate 30 MG 24 hr tablet Commonly known as: IMDUR Take 30 mg by mouth daily.   Jardiance 25 MG Tabs tablet Generic drug: empagliflozin Take 25 mg by mouth daily.   lactulose 10 GM/15ML solution Commonly known as: CHRONULAC Take 30 mLs by mouth daily as needed  for mild constipation.   metFORMIN 500 MG tablet Commonly known as: GLUCOPHAGE Take 500 mg by mouth 2 (two) times daily with a meal.   metoCLOPramide 10 MG tablet Commonly known as: REGLAN Take 1 tablet (10 mg total) by mouth 4 (four) times daily -  before meals and at bedtime.   multivitamin with minerals tablet Take 1 tablet by mouth daily.   nitroGLYCERIN 0.4 MG SL tablet Commonly known as: NITROSTAT Place 0.4 mg under the tongue every 5 (five) minutes as needed for chest pain.   ondansetron 4 MG  disintegrating tablet Commonly known as: ZOFRAN-ODT Take 1 tablet (4 mg total) by mouth every 8 (eight) hours as needed for nausea or vomiting.   oxyCODONE 5 MG immediate release tablet Commonly known as: Oxy IR/ROXICODONE Take 0.5 tablets (2.5 mg total) by mouth every 6 (six) hours as needed for up to 5 days for severe pain.   pantoprazole 40 MG tablet Commonly known as: PROTONIX Take 40 mg by mouth daily.   polyethylene glycol 17 g packet Commonly known as: MIRALAX / GLYCOLAX Take 17 g by mouth daily.   ROBITUSSIN DM PO Take 5 mLs by mouth every 4 (four) hours as needed.   Senna-Plus 8.6-50 MG tablet Generic drug: senna-docusate Take 2 tablets by mouth 2 (two) times daily.   sucralfate 1 g tablet Commonly known as: CARAFATE Take 1 g by mouth 4 (four) times daily.   VISINE OP Place 1 drop into both eyes daily as needed (dry red eyes).   Xarelto 20 MG Tabs tablet Generic drug: rivaroxaban Take 20 mg by mouth daily.            Durable Medical Equipment  (From admission, onward)         Start     Ordered   09/22/20 1501  For home use only DME Hospital bed  Once       Question Answer Comment  Length of Need 6 Months   Patient has (list medical condition): spinal stenosis   The above medical condition requires: Patient requires the ability to reposition frequently   Head must be elevated greater than: 30 degrees   Bed type Semi-electric   Support Surface:  Gel Overlay      09/22/20 1502          Discharge Instructions: Please refer to Patient Instructions section of EMR for full details.  Patient was counseled important signs and symptoms that should prompt return to medical care, changes in medications, dietary instructions, activity restrictions, and follow up appointments.   Follow-Up Appointments:  Contact information for follow-up providers    [facility] Follow up.   Specialty: Home Health Services Why: Physical, occupational therapy and [place] aide to follow up with you at home. They will call you for an appointment.  Contact information: [address] [place] [address] [phone]        Theora Gianotti, NP Follow up on 10/03/2020.   Specialties: Nurse Practitioner, Cardiology, Radiology Why: Please arrive 15 min early for your appointment at 3:10 PM, post hospital cardiology appointment  Contact information: [place] [address] [phone]        Ladene Artist, MD. Schedule an appointment as soon as possible for a visit in 1 month(s).   Specialty: Gastroenterology Why: Make an appointment to follow up with GI.  Contact information: [address] [place] [address] [phone]        [name], MD. Schedule an appointment as soon as possible for a visit in 1 week(s).   Specialty: Family Medicine Why: Make a hospital follow up with your primary care provider. This should be for about one week after discharge.  Contact information: [address] [address] [phone]            Contact information for after-discharge care    Destination    [facility] Preferred SNF .   Service: Skilled Nursing Contact information: [address]  [place], [place], DO 09/25/2020, 12:30 PM PGY-1, [place]

## 2020-09-23 DIAGNOSIS — K3184 Gastroparesis: Secondary | ICD-10-CM | POA: Diagnosis not present

## 2020-09-23 DIAGNOSIS — E1143 Type 2 diabetes mellitus with diabetic autonomic (poly)neuropathy: Secondary | ICD-10-CM | POA: Diagnosis not present

## 2020-09-23 LAB — BASIC METABOLIC PANEL
Anion gap: 8 (ref 5–15)
Anion gap: 9 (ref 5–15)
BUN: 14 mg/dL (ref 8–23)
BUN: 12 mg/dL (ref 8–23)
CO2: 21 mmol/L — ABNORMAL LOW (ref 22–32)
CO2: 23 mmol/L (ref 22–32)
Calcium: 9 mg/dL (ref 8.9–10.3)
Calcium: 9.2 mg/dL (ref 8.9–10.3)
Chloride: 99 mmol/L (ref 98–111)
Chloride: 100 mmol/L (ref 98–111)
Creatinine, Ser: 0.93 mg/dL (ref 0.61–1.24)
Creatinine, Ser: 0.95 mg/dL (ref 0.61–1.24)
GFR, Estimated: >60 mL/min (ref >60)
GFR, Estimated: >60 mL/min (ref >60)
Glucose, Bld: 197 mg/dL — ABNORMAL HIGH (ref 70–99)
Glucose, Bld: 171 mg/dL — ABNORMAL HIGH (ref 70–99)
Potassium: 5.5 mmol/L — ABNORMAL HIGH (ref 3.5–5.1)
Potassium: 3.4 mmol/L — ABNORMAL LOW (ref 3.5–5.1)
Sodium: 128 mmol/L — ABNORMAL LOW (ref 135–145)
Sodium: 132 mmol/L — ABNORMAL LOW (ref 135–145)

## 2020-09-23 LAB — GLUCOSE, CAPILLARY
Glucose-Capillary: 154 mg/dL — ABNORMAL HIGH (ref 70–99)
Glucose-Capillary: 178 mg/dL — ABNORMAL HIGH (ref 70–99)
Glucose-Capillary: 183 mg/dL — ABNORMAL HIGH (ref 70–99)
Glucose-Capillary: 216 mg/dL — ABNORMAL HIGH (ref 70–99)

## 2020-09-23 MED ORDER — OXYCODONE HCL 5 MG PO TABS
5.0000 mg | ORAL_TABLET | Freq: Four times a day (QID) | ORAL | Status: DC | PRN
  Administered 2020-09-23 – 2020-09-25 (×9): 5 mg via ORAL
  Filled 2020-09-23 (×9): qty 1

## 2020-09-23 NOTE — NC FL2 (Signed)
[place] LEVEL OF CARE SCREENING TOOL     IDENTIFICATION  Patient Name: Bryan Scott Birthdate: 10-07-1945 Sex: male Admission Date (Current Location): 09/17/2020  [facility] and [place] Number:  Herbalist and Address:  [facility] [place]. [facility], [place] [address] [place], [place] [address]      Provider Number: [id number]  Attending Physician Name and Address:  McDiarmid, Blane Ohara, MD  Relative Name and Phone Number:       Current Level of Care: Hospital Recommended Level of Care: [place] Prior Approval Number:    Date Approved/Denied:   PASRR Number: Pending  Discharge Plan: ICF    Current Diagnoses: Patient Active Problem List   Diagnosis Date Noted  . 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 Non-intractable vomiting   . Dehydration   . Fall   . Injury   . Malnutrition of moderate degree 09/19/2020  . Protein-calorie malnutrition ([place]) 09/18/2020  . Syncope 09/17/2020    Orientation RESPIRATION BLADDER Height & Weight     Self,Time,Situation,Place  Normal Continent Weight: 158 lb (71.7 kg) Height:  6' (182.9 cm)  BEHAVIORAL SYMPTOMS/MOOD NEUROLOGICAL BOWEL NUTRITION STATUS      Continent    AMBULATORY STATUS COMMUNICATION OF NEEDS Skin   Limited Assist Verbally Normal                       Personal Care Assistance Level of Assistance  Bathing,Feeding,Dressing Bathing Assistance: Limited assistance Feeding assistance: Limited assistance Dressing Assistance: Limited assistance     Functional Limitations Info             SPECIAL CARE FACTORS FREQUENCY  PT (By licensed PT),OT (By licensed OT)     PT Frequency: 5x weekly OT Frequency: 5x weekly            Contractures Contractures Info: Not present    Additional Factors Info  Allergies,Code Status Code Status Info: Full Allergies Info: Aspirin, lisinopril           Current Medications (09/23/2020):  This is the current hospital  active medication list Current Facility-Administered Medications  Medication Dose Route Frequency Provider Last Rate Last Admin  . 0.9 %  sodium chloride infusion   Intravenous Continuous Benay Pike, MD   Paused at 09/22/20 0100  . acetaminophen (TYLENOL) tablet 650 mg  650 mg Oral Q6H Ezequiel Essex, MD   650 mg at 09/23/20 0538  . atorvastatin (LIPITOR) tablet 80 mg  80 mg Oral Daily Ganta, Anupa, DO   80 mg at 09/23/20 0841  . carvedilol (COREG) tablet 3.125 mg  3.125 mg Oral BID WC Ganta, Anupa, DO   3.125 mg at 09/23/20 0842  . clopidogrel (PLAVIX) tablet 75 mg  75 mg Oral q morning Lurline Del, DO   75 mg at 09/23/20 9485  . ezetimibe (ZETIA) tablet 10 mg  10 mg Oral Daily Ganta, Anupa, DO   10 mg at 09/23/20 0842  . feeding supplement (ENSURE ENLIVE / ENSURE PLUS) liquid 237 mL  237 mL Oral TID BM Dickie La, MD   237 mL at 09/21/20 2030  . gabapentin (NEURONTIN) capsule 100 mg  100 mg Oral TID Ganta, Anupa, DO   100 mg at 09/23/20 0842  . insulin aspart (novoLOG) injection 0-9 Units  0-9 Units Subcutaneous TID WC Ganta, Anupa, DO   2 Units at 09/23/20 0629  . isosorbide mononitrate (IMDUR) 24 hr tablet 30 mg  30 mg  Oral Daily Margie Billet, NP   30 mg at 09/23/20 [phone]  . lidocaine (LIDODERM) 5 % 1 patch  1 patch Transdermal Q24H Lurline Del, DO   1 patch at 09/22/20 1110  . metoCLOPramide (REGLAN) tablet 10 mg  10 mg Oral TID AC & HS Welborn, Ryan, DO   10 mg at 09/23/20 0841  . ondansetron (ZOFRAN-ODT) disintegrating tablet 4 mg  4 mg Oral Q8H PRN Benay Pike, MD   4 mg at 09/19/20 [phone]  . oxyCODONE (Oxy IR/ROXICODONE) immediate release tablet 5 mg  5 mg Oral Q6H PRN Lurline Del, DO      . pantoprazole (PROTONIX) EC tablet 40 mg  40 mg Oral Daily Ganta, Anupa, DO   40 mg at 09/23/20 0842  . polyethylene glycol (MIRALAX / GLYCOLAX) packet 17 g  17 g Oral Daily Ezequiel Essex, MD   17 g at 09/21/20 0957  . rivaroxaban (XARELTO) tablet 20 mg  20 mg Oral QAC supper Lurline Del, DO   20 mg at 09/22/20 1653  . senna (SENOKOT) tablet 8.6 mg  1 tablet Oral Daily Ezequiel Essex, MD   8.6 mg at 09/21/20 0955  . sucralfate (CARAFATE) tablet 1 g  1 g Oral QID Ganta, Anupa, DO   1 g at 09/23/20 1031     Discharge Medications: Please see discharge summary for a list of discharge medications.  Relevant Imaging Results:  Relevant Lab Results:   Additional Lydia, LCSW

## 2020-09-23 NOTE — Progress Notes (Signed)
Family Medicine Teaching Service Daily Progress Note Intern Pager: [phone]  Patient name: Bryan Scott Medical record number: [id number] Date of birth: April 10, 1946 Age: 75 y.o. Gender: male  Primary Care Provider: Administration, Veterans Consultants: Cardiology, GI Code Status: Full  Pt Overview and Major Events to Date:  4/4 admitted  4/7 EGD  Assessment and Plan: Mr. Thoma is a 75 year old male presenting with nausea, vomiting, abrasions and contusions secondary to syncopal episode.  PMH includes HTN, T2DM, A. fib, CAD s/p CABG 2007 and s/p coronary stent 2002, HLD, spinal stenosis, history of tobacco use.   Syncope Patient states he still feels some dizziness with ambulation and some weakness.  He states that initially he was wanting to go back home but at this point he thinks SNF would be better for him since he lives alone.  PT recommends SNF, OT recommends HH/supervision for 24 hours. -Continue imdur -Continuous cardiac monitoring -Encourage p.o. intake as tolerated -PT OT eval and treat -Up with assistance -We will reach back out to [facility] for SNF placement   Gastroparesis  Nausea and vomiting  Normal EGD 4/7. Nausea still present, reports meds keep him from vomiting.  -Full liquid diet, advance as tolerated -Per GI, will continue metoclopramide, Carafate, Protonix -Zofran ODT every 8 as needed - Dietician to counsel regarding best practices for gastroparesis   CAD s/p CABG 2007  NSTEMI s/p coronary stent 2022  HFrEF  P. Mitralis -Continuing home atorvastatin, Imdur -Xarelto, Plavix restarted 4/8 -Cardiology consulted, appreciate recommendations   Skin abrasions  bruising on long term AC use -Wound care per nursing     Type 2 DM Glucose last 24 hours 154-183.  6 units insulin aspart last 24 hours sliding-scale protocol -Continue sensitive sliding scale insulin -Holding home Jardiance and Metformin, will restart on discharge    Hypokalemia Potassium pending this  morning -A.m. BMP   Hypertension Blood pressures normotensive, 062-376 systolic over 28B-15V diastolic. -Continue home meds of Coreg and Imdur    Chronic pain syndrome  Spinal stenosis  At home on gabapentin 100 mg 3 times daily daily.  Chronic pain likely exacerbated by ecchymosis and swelling on back and arm from fall. -Tylenol 650 mg every 6 -Home gabapentin 100 mg 3 times daily -We will decrease OxyIR from 7 mg every 6 hours as needed for severe pain to 5 mg every 6 hours as needed for severe pain.  We will plan to continue weaning patient's oxycodone -Ice or heat as needed -Lidocaine patch as needed -Offer rib binding elastics for comfort - Encourage ambulation, physical activity   Hyperlipidemia Continue home atorvastatin and Zetia.   FEN/GI: Regular PPx: Plavix, Xarelto   Status is: Inpatient  Remains inpatient appropriate because:Now awaiting SNF placement   Dispo:  Patient From: [place]  Planned Disposition: [place]  Medically stable for discharge: Yes      Subjective:  Patient states that he feels somewhat better than yesterday.  He states that he has thought a lot about it and in the past did use home health without great success and since he lives alone he does wish to go to a skilled nursing facility for some rehabilitation.  He denies other complaints at this time.  States that his nausea is still present but somewhat improved on current medication.  Objective: Temp:  [97.8 F (36.6 C)-98.7 F (37.1 C)] 97.8 F (36.6 C) (04/10 0500) Pulse Rate:  [71-82] 80 (04/10 0500) Resp:  [16-18] 18 (04/10 0500) BP: (101-139)/(66-88) 139/84 (  04/10 0500) SpO2:  [94 %-96 %] 96 % (04/10 0500)  Physical Exam: General: Alert and oriented in no apparent distress Heart: S1, S2 present with no murmur heard Lungs: CTA bilaterally, no wheezing Abdomen: Bowel sounds present, mild abdominal discomfort in the musculature with no acute  peritoneal findings Skin: Warm and dry  Laboratory: Recent Labs  Lab 09/19/20 0836 09/19/20 1728 09/21/20 0449  WBC 9.1 8.0 9.4  HGB 14.5 14.6 15.8  HCT 41.1 41.8 45.1  PLT 172 176 186   Recent Labs  Lab 09/18/20 0328 09/18/20 1943 09/19/20 0118 09/20/20 0034 09/21/20 0449 09/22/20 0318  NA  --    < > 133* 133* 133* 134*  K  --    < > 3.1* 3.3* 4.0 3.7  CL  --    < > 102 103 104 103  CO2  --    < > 26 24 20* 22  BUN  --    < > 19 15 13 13   CREATININE  --    < > 1.13 0.98 0.96 0.89  CALCIUM  --    < > 8.3* 8.3* 9.0 9.3  PROT 7.0  --  5.5* 6.0*  --   --   BILITOT 2.8*  --  1.8* 1.5*  --   --   ALKPHOS 69  --  59 68  --   --   ALT 23  --  16 17  --   --   AST 20  --  16 19  --   --   GLUCOSE  --    < > 110* 181* 191* 150*   < > = values in this interval not displayed.    Imaging/Diagnostic Tests: None last 24 hours.   Lurline Del, DO 09/23/2020, 8:26 AM PGY-2, [place] Intern pager: [phone], text pages welcome

## 2020-09-23 NOTE — TOC Progression Note (Signed)
Transition of Care [facility]) - Progression Note    Patient Details  Name: Bryan Scott MRN: [id number] Date of Birth: 1945/10/28  Transition of Care [facility]) CM/SW [place], LCSW Phone Number: 09/23/2020, 11:11 AM  Clinical Narrative:  CSW contacted by charge RN that patient was receptive to SNF referral and placement. CSW met with patient and confirmed at this time he would like a SNF referral. CSW discussed the SNF process and set expectations for process. CSW noted no preference for geographic location but preference for higher rated facilities. CSW will initial SNF referral and update patient as bed offers are made.     Expected Discharge Plan: Skilled Nursing Facility Barriers to Discharge: SNF Pending bed offer  Expected Discharge Plan and Services Expected Discharge Plan: [place]   Discharge Planning Services: CM Consult Post Acute Care Choice: [place] arrangements for the past 2 months: Single Family Home                 DME Arranged: Hospital bed DME Agency: [employer] Date DME Agency Contacted: 09/22/20 Time DME Agency Contacted: [id number] Representative spoke with at DME Agency: [place]: RN,PT,OT,Nurse's Aide,Social Work [facility] Agency: [facility] Date [place]: 09/22/20 Time [place]: 1430 Representative spoke with at [place]: [place] ([place]) Interventions    Readmission Risk Interventions No flowsheet data found.

## 2020-09-23 NOTE — Plan of Care (Signed)

## 2020-09-23 NOTE — Hospital Course (Addendum)
Mr. Kohen is a 75 year old male presenting with nausea, vomiting, abrasions and contusions secondary to syncopal episode.  PMH includes HTN, T2DM, A. fib, CAD s/p CABG 2007 and s/p coronary stent 2002, HLD, spinal stenosis, history of tobacco use.   Syncope 2/2 dehydration Patient presents with syncopal episode during micturition after a day of vomiting and diarrhea. Chart review complicated by care at multiple facilities and duplicate charts, marked for merging. On presentation, patient vitals stable with labs notable for Na 131, K 3, glucose 162, leukocytosis of 15.9, troponin flat and UA pending. In the ED, given percocet along with 1L NS bolus and IV zofran 4 mg. CT head w/o contrast unremarkable, no acute intracranial abnormality noted. Shoulder XR demonstrated no acute osseous abnormality. Patient has had history of recurrent falls with 3 falls in the past 2 months. No bony abnormalities or fractures noted on extensive X-ray imaging.  Orthostatics positive on admission. Patient monitored on telemetry. EKG demonstrated NSR with QTc 463, no ST elevation. Syncope improved with improved hydration and control of nausea and vomiting. Nausea still present at time of discharge, but medications prevented vomiting. *** Patient discharged on regimen discussed below in gastroparesis section.    Gastroparesis  Concern for Intra-Abdominal Process Continued nausea throughout hospitalization.  Lipase normal. Extensive work-up previously at outside hospitals with outpatient GI: See note from Dr. Mallie Mussel 4/5 at 10:31 AM for summary of all studies found per chart review today.  Per GI note, apparently MRCP in Jan 2022 concerning for pancreatic cancer; ERCP recommended but patient on plavix at the time for CAD as discussed below. Later note per GI Dr. 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 GI consulted here at [facility]. EGD performed 4/7 was unremarkable, no masses, erosions, or abnormalities found. Patient discharged on protonix 40 mg, reglan 10 mg TID before meals and qHS, and sucralfate 1g QID.***   CAD s/p CABG 2007  NSTMEI s/p stent 2022 Plavix and Xarelto held for EGD procedure, restarted next day. High risk patient, given new stent in January 2022. Discussed risks and benefits with cardiologist and patient. Shared decision making used. Will recommend follow up with cardiologist for monitoring.    Pain from ecchymoses, swelling 2/2 fall from syncope  Chronic pain  Spinal Stenosis At home on gabapentin 100 mg 3 times daily daily.  Chronic pain likely exacerbated by ecchymosis and swelling on back and arm from fall. Pain controlled during hospitalization with home gabapentin, tylenol 650 mg q6 hours, and oxycodone IR. Oxycodone initially given IR 10 mg q6 PRN, weaned down to 5 mg oxycodone IR q6 on day of discharge. Patient sent home with script for 5 days worth of oxycodone IR 5 mg q6 PRN with expectation of gradually weaning off. This was discussed with patient.    Other chronic conditions stable during admission: HLD HTN T2DM HFrEF  Issues for Follow Up:  Syncope: Follow up repeat episodes. Hydration improved while admitted.  Nausea and vomiting: Patient counseled on gastroparesis eating and foods. Re-enforce education and adjust medications as necessary.  Will need GI follow up for gastroparesis and new medications.  Will need cardiology follow up for holding Plavix and Xarelto for EGD in context of new stent a couple months ago. Plavix/Xarelto  restarted next day after procedure.  Chronic pain: Given 5 day supply of oxycodone 5 mg q6 PRN for acute pain from fall and  exacerbation of chronic pain. Patient understands this is for gradual tapering. Recommend increasing movement and physical activity as able.  Recommend titrating down on Reglan after 3 weeks to 5mg  from 10. ***

## 2020-09-24 ENCOUNTER — Inpatient Hospital Stay (HOSPITAL_COMMUNITY): Payer: Medicare PPO

## 2020-09-24 ENCOUNTER — Encounter (HOSPITAL_COMMUNITY): Payer: Self-pay | Admitting: Gastroenterology

## 2020-09-24 DIAGNOSIS — K3184 Gastroparesis: Secondary | ICD-10-CM | POA: Diagnosis not present

## 2020-09-24 DIAGNOSIS — E1143 Type 2 diabetes mellitus with diabetic autonomic (poly)neuropathy: Secondary | ICD-10-CM | POA: Diagnosis not present

## 2020-09-24 LAB — BASIC METABOLIC PANEL
Anion gap: 7 (ref 5–15)
BUN: 16 mg/dL (ref 8–23)
CO2: 24 mmol/L (ref 22–32)
Calcium: 9.2 mg/dL (ref 8.9–10.3)
Chloride: 101 mmol/L (ref 98–111)
Creatinine, Ser: 1.12 mg/dL (ref 0.61–1.24)
GFR, Estimated: >60 mL/min (ref >60)
Glucose, Bld: 158 mg/dL — ABNORMAL HIGH (ref 70–99)
Potassium: 3.3 mmol/L — ABNORMAL LOW (ref 3.5–5.1)
Sodium: 132 mmol/L — ABNORMAL LOW (ref 135–145)

## 2020-09-24 LAB — CBC
HCT: 45.6 % (ref 39.0–52.0)
Hemoglobin: 16.2 g/dL (ref 13.0–17.0)
MCH: 33.1 pg (ref 26.0–34.0)
MCHC: 35.5 g/dL (ref 30.0–36.0)
MCV: 93.1 fL (ref 80.0–100.0)
Platelets: 162 10*3/uL (ref 150–400)
RBC: 4.9 MIL/uL (ref 4.22–5.81)
RDW: 13.2 % (ref 11.5–15.5)
WBC: 7.6 10*3/uL (ref 4.0–10.5)
nRBC: 0 % (ref 0.0–0.2)

## 2020-09-24 LAB — GLUCOSE, CAPILLARY
Glucose-Capillary: 141 mg/dL — ABNORMAL HIGH (ref 70–99)
Glucose-Capillary: 202 mg/dL — ABNORMAL HIGH (ref 70–99)
Glucose-Capillary: 202 mg/dL — ABNORMAL HIGH (ref 70–99)
Glucose-Capillary: 224 mg/dL — ABNORMAL HIGH (ref 70–99)

## 2020-09-24 LAB — PATHOLOGIST SMEAR REVIEW

## 2020-09-24 LAB — SARS CORONAVIRUS 2 (TAT 6-24 HRS): SARS Coronavirus 2: NEGATIVE

## 2020-09-24 IMAGING — DX DG RIBS 2V*R*
4 series · 4 of 4 positions shown · non-contrast
Comparison: Chest x-ray dated [DATE]. CTA chest dated [DATE].

CLINICAL DATA: And lower right rib pain after a fall week ago.

EXAM:
RIGHT RIBS - 2 VIEW

[rib ap (1 of 2)]
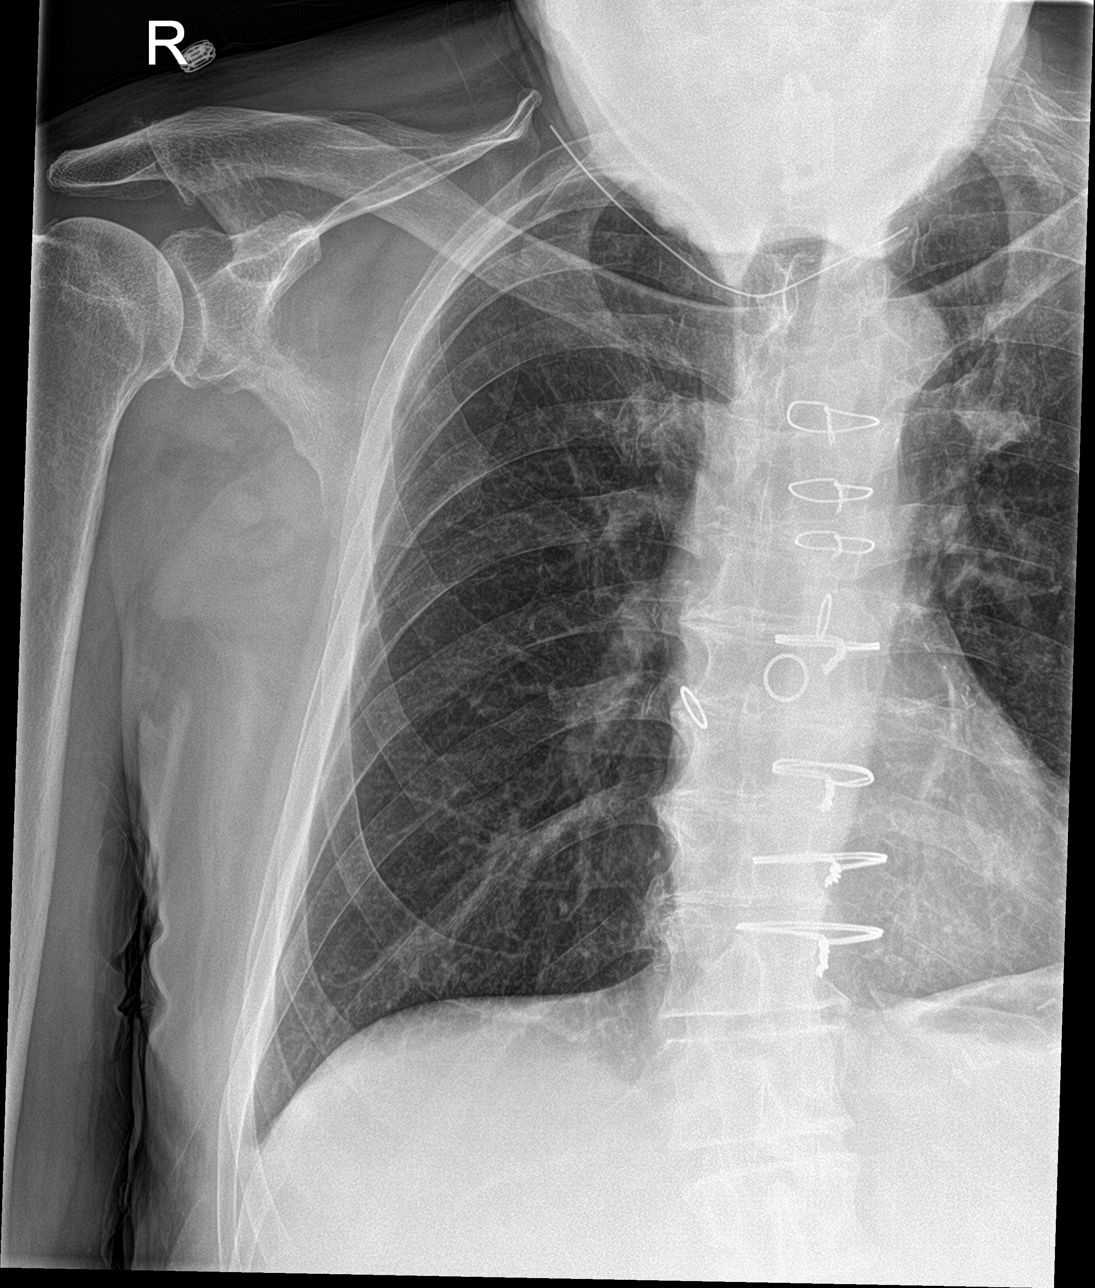

[rib ap obl (1 of 2)]
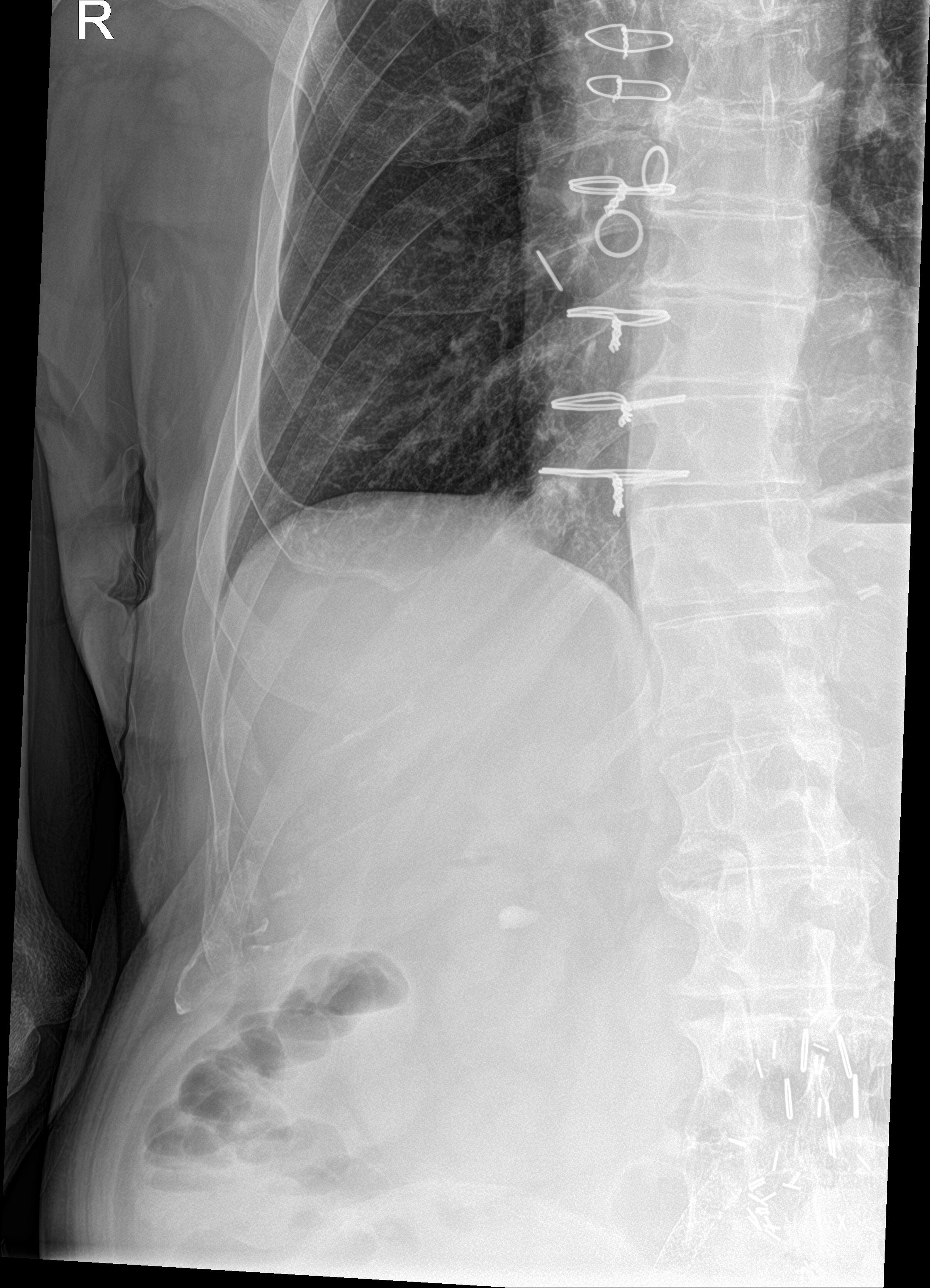

[rib ap (2 of 2)]
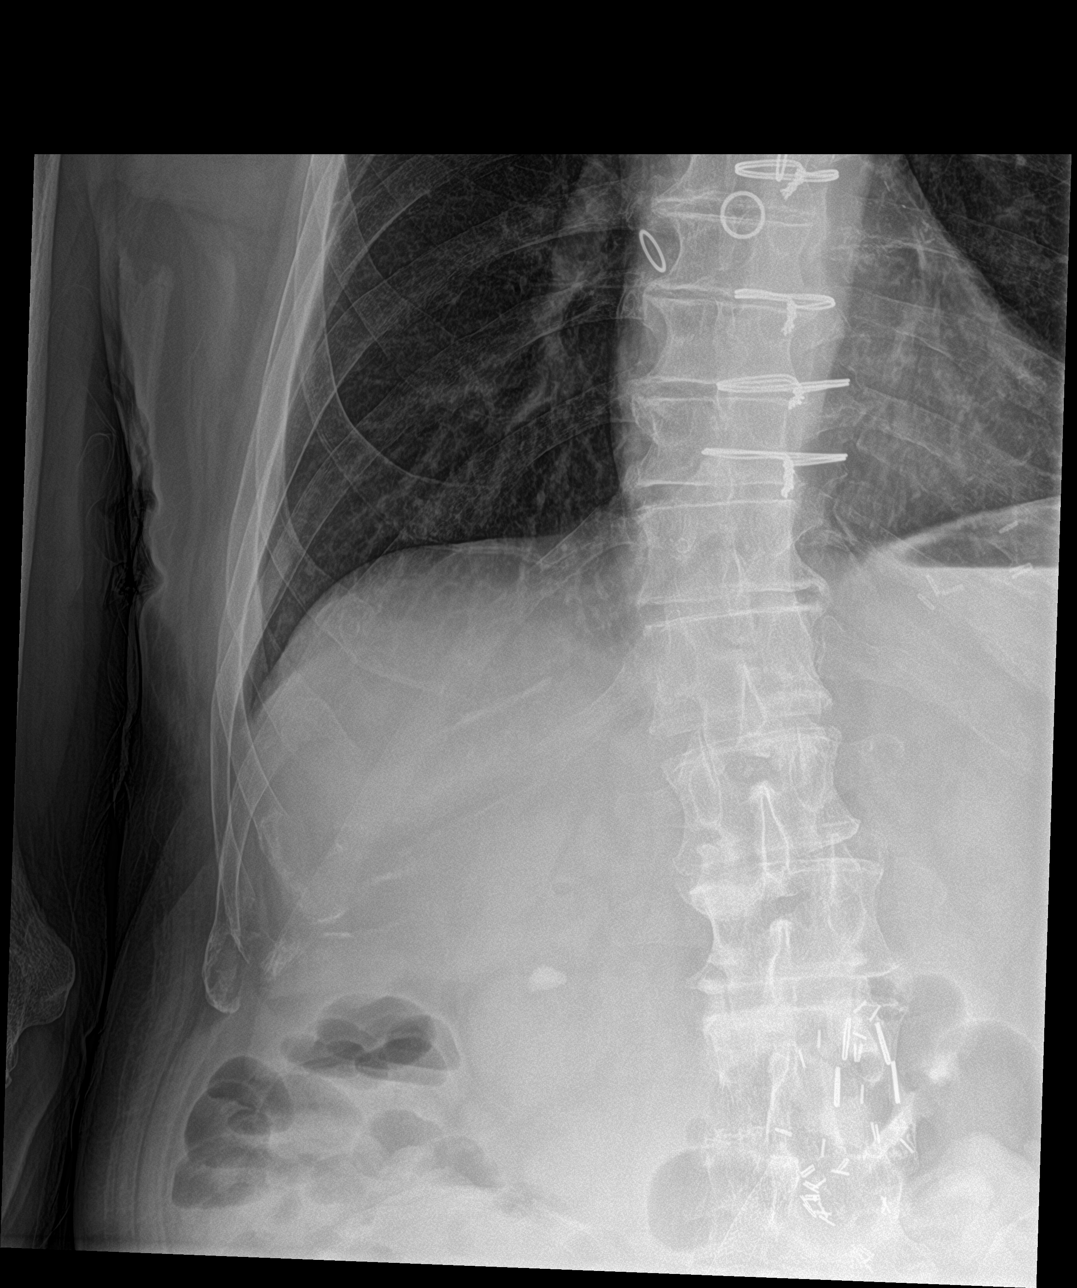

[rib ap obl (2 of 2)]
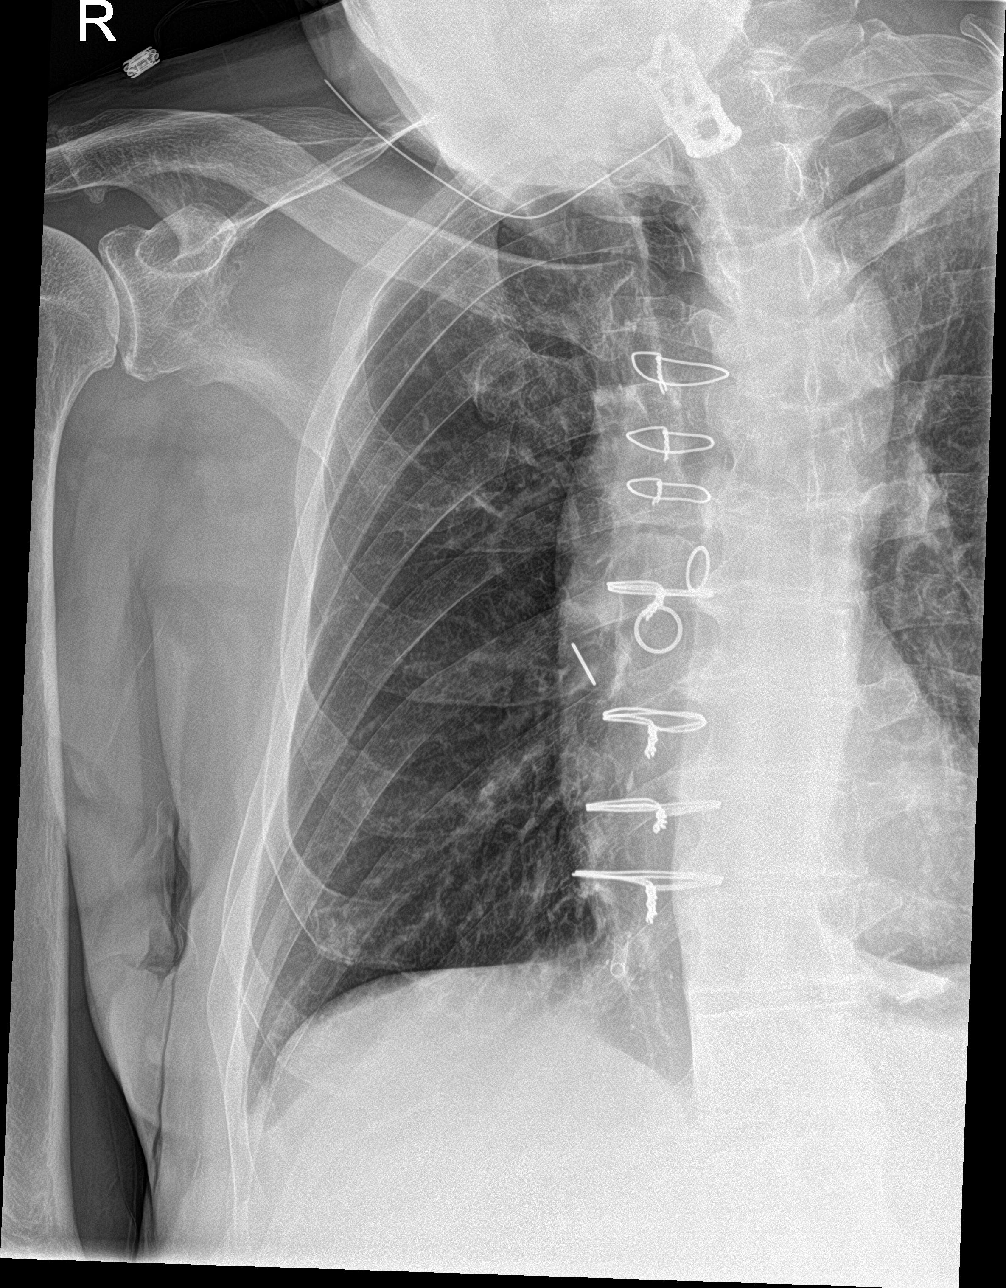

[4 of 4 positions shown; findings below may reference images not displayed]

FINDINGS: No fracture or other bone lesions are seen involving the ribs.
Unchanged right nephrolithiasis.
IMPRESSION: 1. Negative right ribs.
2. Unchanged right nephrolithiasis.

## 2020-09-24 MED ORDER — POTASSIUM CHLORIDE CRYS ER 20 MEQ PO TBCR
40.0000 meq | EXTENDED_RELEASE_TABLET | Freq: Once | ORAL | Status: AC
  Administered 2020-09-24: 40 meq via ORAL
  Filled 2020-09-24: qty 2

## 2020-09-24 NOTE — NC FL2 (Signed)
[place] LEVEL OF CARE SCREENING TOOL     IDENTIFICATION  Patient Name: Bryan Scott Birthdate: 03-04-1946 Sex: male Admission Date (Current Location): 09/17/2020  [facility] and [place] Number:  Herbalist and Address:  [facility] [place], [place] [address], [place], [place] [address]      Provider Number: [id number]  Attending Physician Name and Address:  McDiarmid, Blane Ohara, MD  Relative Name and Phone Number:  Hinda Kehr    Current Level of Care: Hospital Recommended Level of Care: [place] Prior Approval Number:    Date Approved/Denied:   PASRR Number: [id number]  Discharge Plan: SNF    Current Diagnoses: Patient Active Problem List   Diagnosis Date Noted  . 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 Non-intractable vomiting   . Dehydration   . Fall   . Injury   . Malnutrition of moderate degree 09/19/2020  . Protein-calorie malnutrition ([place]) 09/18/2020  . Syncope 09/17/2020    Orientation RESPIRATION BLADDER Height & Weight     Self,Time,Situation,Place  Normal Continent Weight: 78 kg Height:  6' (182.9 cm)  BEHAVIORAL SYMPTOMS/MOOD NEUROLOGICAL BOWEL NUTRITION STATUS      Continent Diet  AMBULATORY STATUS COMMUNICATION OF NEEDS Skin   Limited Assist Verbally  (Bruising due to long term xarelto and plavix use)                       Personal Care Assistance Level of Assistance  Bathing,Dressing Bathing Assistance: Limited assistance Feeding assistance: Limited assistance Dressing Assistance: Limited assistance     Functional Limitations Info             SPECIAL CARE FACTORS FREQUENCY  PT (By licensed PT),OT (By licensed OT)     PT Frequency: 5 times a week OT Frequency: 5 times a week            Contractures Contractures Info: Not present    Additional Factors Info  Code Status,Allergies,Insulin Sliding Scale Code Status Info: full Allergies Info: Aspirin, lisinopril    Insulin Sliding Scale Info: Novolg 0 to 9 units TID with meals       Current Medications (09/24/2020):  This is the current hospital active medication list Current Facility-Administered Medications  Medication Dose Route Frequency Provider Last Rate Last Admin  . 0.9 %  sodium chloride infusion   Intravenous Continuous Benay Pike, MD   Paused at 09/22/20 0100  . acetaminophen (TYLENOL) tablet 650 mg  650 mg Oral Q6H Ezequiel Essex, MD   650 mg at 09/24/20 1145  . atorvastatin (LIPITOR) tablet 80 mg  80 mg Oral Daily Ganta, Anupa, DO   80 mg at 09/24/20 0900  . carvedilol (COREG) tablet 3.125 mg  3.125 mg Oral BID WC Ganta, Anupa, DO   3.125 mg at 09/24/20 0900  . clopidogrel (PLAVIX) tablet 75 mg  75 mg Oral q morning Lurline Del, DO   75 mg at 09/24/20 0904  . ezetimibe (ZETIA) tablet 10 mg  10 mg Oral Daily Ganta, Anupa, DO   10 mg at 09/24/20 0900  . feeding supplement (ENSURE ENLIVE / ENSURE PLUS) liquid 237 mL  237 mL Oral TID BM Dickie La, MD   237 mL at 09/23/20 2004  . gabapentin (NEURONTIN) capsule 100 mg  100 mg Oral TID Ganta, Anupa, DO   100 mg at 09/24/20 0900  . insulin aspart (novoLOG) injection 0-9 Units  0-9 Units Subcutaneous TID WC Ganta,  Anupa, DO   3 Units at 09/24/20 1146  . isosorbide mononitrate (IMDUR) 24 hr tablet 30 mg  30 mg Oral Daily Margie Billet, NP   30 mg at 09/24/20 0900  . lidocaine (LIDODERM) 5 % 1 patch  1 patch Transdermal Q24H Lurline Del, DO   1 patch at 09/24/20 1147  . metoCLOPramide (REGLAN) tablet 10 mg  10 mg Oral TID AC & HS Welborn, Ryan, DO   10 mg at 09/24/20 1146  . ondansetron (ZOFRAN-ODT) disintegrating tablet 4 mg  4 mg Oral Q8H PRN Benay Pike, MD   4 mg at 09/19/20 [phone]  . oxyCODONE (Oxy IR/ROXICODONE) immediate release tablet 5 mg  5 mg Oral Q6H PRN Lurline Del, DO   5 mg at 09/24/20 1145  . pantoprazole (PROTONIX) EC tablet 40 mg  40 mg Oral Daily Ganta, Anupa, DO   40 mg at 09/24/20 0900  . polyethylene glycol (MIRALAX  / GLYCOLAX) packet 17 g  17 g Oral Daily Ezequiel Essex, MD   17 g at 09/24/20 0900  . rivaroxaban (XARELTO) tablet 20 mg  20 mg Oral QAC supper Lurline Del, DO   20 mg at 09/23/20 1748  . senna (SENOKOT) tablet 8.6 mg  1 tablet Oral Daily Ezequiel Essex, MD   8.6 mg at 09/24/20 0900  . sucralfate (CARAFATE) tablet 1 g  1 g Oral QID Ganta, Anupa, DO   1 g at 09/24/20 0900     Discharge Medications: Please see discharge summary for a list of discharge medications.  Relevant Imaging Results:  Relevant Lab Results:   Additional Information [id number]  Raelan Burgoon, Edson Snowball, RN

## 2020-09-24 NOTE — Progress Notes (Signed)
Occupational Therapy Treatment Patient Details Name: Bryan Scott MRN: [id number] DOB: [birth year] Today's Date: 09/24/2020    History of present illness Bryan Scott is a 75 y/o male who presented to ED on 09/17/20 with lacerations on his chin and right UE following a fall in his bathroom after a syncopal episode. CT and DG was unremarkable for acute changes. PMH includes HTN, T2DM< A-fib, CAD s/p CABG and s/p coronary stent, HLD, spinal stenosis, tobacco use, and potential intraabdominal cancer/mass.   OT comments  Pt progressing gradually towards OT goals, able to mobilize to/from bathroom at min guard with RW and light Min A for sit to stand transfers. Pt continues to be limited by pain and noted improvements in dizziness with variable BP readings. Reinforced energy conservation strategies and fall prevention. Noted that pt now open to SNF rehab prior to discharge home due to deficits.   BP at rest: 149/119 (130) BP sitting: 100/78 (84) BP standing: 152/73 (97) After activity: 137/60 (78)   Follow Up Recommendations  SNF;Supervision/Assistance - 24 hour (pt now agreeable to SNF rehab)    Equipment Recommendations  Hospital bed    Recommendations for Other Services      Precautions / Restrictions Precautions Precautions: Fall Precaution Comments: monitor BP Restrictions Weight Bearing Restrictions: No       Mobility Bed Mobility Overal bed mobility: Needs Assistance Bed Mobility: Supine to Sit     Supine to sit: Supervision;HOB elevated     General bed mobility comments: increased time    Transfers Overall transfer level: Needs assistance Equipment used: Rolling walker (2 wheeled) Transfers: Sit to/from Stand Sit to Stand: Min assist         General transfer comment: light Min A for steadying in power up, close to progression to min guard    Balance Overall balance assessment: Needs assistance Sitting-balance support: Feet supported;No upper extremity  supported Sitting balance-Leahy Scale: Good     Standing balance support: Bilateral upper extremity supported;During functional activity Standing balance-Leahy Scale: Poor Standing balance comment: reliant on UE support                           ADL either performed or assessed with clinical judgement   ADL Overall ADL's : Needs assistance/impaired                         Toilet Transfer: Min guard;Ambulation;RW;Regular Toilet;Grab bars Toilet Transfer Details (indicate cue type and reason): min guard for safety due to intermittent dizziness. light Min A for power up from regular toilet using grab bars, no BSC in room to place over toilet Toileting- Clothing Manipulation and Hygiene: Min guard;Sit to/from stand Toileting - Clothing Manipulation Details (indicate cue type and reason): min guard for balance in clothing mgmt     Functional mobility during ADLs: Min guard;Rolling walker General ADL Comments: Improved tolerance to activity though continues to have limitations due to pain, varying BP and intermittent dizziness     Vision   Vision Assessment?: No apparent visual deficits   Perception     Praxis      Cognition Arousal/Alertness: Awake/alert Behavior During Therapy: WFL for tasks assessed/performed Overall Cognitive Status: Within Functional Limits for tasks assessed                                 General Comments: aware of deficits and  need for safety strategies        Exercises     Shoulder Instructions       General Comments BP with varying readings, dizziness in standing not correlated to BP readings.    Pertinent Vitals/ Pain       Pain Assessment: Faces Faces Pain Scale: Hurts little more Pain Location: Lower back, R LE Pain Descriptors / Indicators: Constant;Grimacing Pain Intervention(s): Monitored during session;Limited activity within patient's tolerance;Premedicated before session  Home Living                                           Prior Functioning/Environment              Frequency  Min 2X/week        Progress Toward Goals  OT Goals(current goals can now be found in the care plan section)  Progress towards OT goals: Progressing toward goals  Acute Rehab OT Goals Patient Stated Goal: Get stronger before returning home OT Goal Formulation: With patient Time For Goal Achievement: 10/02/20 Potential to Achieve Goals: Good ADL Goals Pt Will Perform Grooming: with supervision;standing Pt Will Perform Upper Body Dressing: with modified independence;sitting Pt Will Perform Lower Body Dressing: with min guard assist;sit to/from stand Pt Will Transfer to Toilet: with supervision;ambulating Pt Will Perform Toileting - Clothing Manipulation and hygiene: with supervision;sit to/from stand  Plan Discharge plan needs to be updated    Co-evaluation                 AM-PAC OT "6 Clicks" Daily Activity     Outcome Measure   Help from another person eating meals?: None Help from another person taking care of personal grooming?: A Little Help from another person toileting, which includes using toliet, bedpan, or urinal?: A Little Help from another person bathing (including washing, rinsing, drying)?: A Little Help from another person to put on and taking off regular upper body clothing?: A Little Help from another person to put on and taking off regular lower body clothing?: A Lot 6 Click Score: 18    End of Session Equipment Utilized During Treatment: Gait belt;Rolling walker  OT Visit Diagnosis: Unsteadiness on feet (R26.81);History of falling (Z91.81);Muscle weakness (generalized) (M62.81);Other symptoms and signs involving cognitive function   Activity Tolerance Patient tolerated treatment well;Patient limited by pain   Patient Left in chair;with call bell/phone within reach   Nurse Communication Mobility status;Other (comment)        Time:  0355-9741 OT Time Calculation (min): 44 min  Charges: OT General Charges $OT Visit: 1 Visit OT Treatments $Self Care/Home Management : 8-22 mins $Therapeutic Activity: 23-37 mins  Malachy Chamber, OTR/L Acute Rehab Services Office: [phone]   Layla Maw 09/24/2020, 9:07 AM

## 2020-09-24 NOTE — Progress Notes (Signed)
Physical Therapy Treatment Patient Details Name: Bryan Scott MRN: [id number] DOB: [birth year] Today's Date: 09/24/2020    History of Present Illness Bryan Scott is a 75 y/o male who presented to ED on 09/17/20 with lacerations on his chin and right UE following a fall in his bathroom after a syncopal episode. CT and DG was unremarkable for acute changes. PMH includes HTN, T2DM< A-fib, CAD s/p CABG and s/p coronary stent, HLD, spinal stenosis, tobacco use, and potential intraabdominal cancer/mass.    PT Comments    Pt making gradual progress. He had no complaints of dizziness.  He tolerated short distance ambulation with min A to steady.  Requiring min-mod A for sit to stand depending on fatigue level.  Did see pt at end of day and he was fatigued.  Continue to progress as able.      Follow Up Recommendations  SNF     Equipment Recommendations  Rolling walker with 5" wheels (further assessment next venue)    Recommendations for Other Services       Precautions / Restrictions Precautions Precautions: Fall Precaution Comments: Mar[place] Kitchen    Mobility  Bed Mobility Overal bed mobility: Needs Assistance Bed Mobility: Sit to Supine       Sit to supine: Min guard;HOB elevated        Transfers Overall transfer level: Needs assistance Equipment used: Rolling walker (2 wheeled) Transfers: Sit to/from Stand Sit to Stand: Min assist;Mod assist         General transfer comment: Performed sit to stand x 3 throughout session- initially with min A but with fatigue requiring mod A; cues for hand placement and to come to full upright stand  Ambulation/Gait Ambulation/Gait assistance: Min assist Gait Distance (Feet): 20 Feet Assistive device: Rolling walker (2 wheeled) Gait Pattern/deviations: Step-to pattern;Decreased stride length;Trunk flexed Gait velocity: decreased   General Gait Details: Cues for posture and staying close to RW; 1 episode of knee buckling requiring min A ; fatigued  easily   Stairs             Wheelchair Mobility    Modified Rankin (Stroke Patients Only)       Balance Overall balance assessment: Needs assistance Sitting-balance support: Feet supported;No upper extremity supported Sitting balance-Leahy Scale: Good     Standing balance support: Bilateral upper extremity supported;During functional activity Standing balance-Leahy Scale: Poor Standing balance comment: reliant on UE support                            Cognition Arousal/Alertness: Awake/alert Behavior During Therapy: WFL for tasks assessed/performed Overall Cognitive Status: Within Functional Limits for tasks assessed                                        Exercises General Exercises - Lower Extremity Long Arc Quad: AROM;Both;10 reps;Seated Hip Flexion/Marching: AROM;Both;10 reps;Seated    General Comments General comments (skin integrity, edema, etc.): Pt had no complaints of dizziness this afternoon      Pertinent Vitals/Pain Pain Assessment: Faces Faces Pain Scale: Hurts little more Pain Location: R Lower back, R LE Pain Descriptors / Indicators: Discomfort Pain Intervention(s): Limited activity within patient's tolerance;Monitored during session;RN gave pain meds during session    Home Living                      Prior Function  PT Goals (current goals can now be found in the care plan section) Acute Rehab PT Goals Patient Stated Goal: Get stronger before returning home PT Goal Formulation: With patient Time For Goal Achievement: 10/02/20 Potential to Achieve Goals: Good Progress towards PT goals: Progressing toward goals    Frequency    Min 2X/week      PT Plan Frequency needs to be updated    Co-evaluation              AM-PAC PT "6 Clicks" Mobility   Outcome Measure  Help needed turning from your back to your side while in a flat bed without using bedrails?: A Little Help needed  moving from lying on your back to sitting on the side of a flat bed without using bedrails?: A Little Help needed moving to and from a bed to a chair (including a wheelchair)?: A Little Help needed standing up from a chair using your arms (e.g., wheelchair or bedside chair)?: A Lot Help needed to walk in hospital room?: A Little Help needed climbing 3-5 steps with a railing? : Total 6 Click Score: 15    End of Session Equipment Utilized During Treatment: Gait belt Activity Tolerance: Patient limited by fatigue (saw pt at end of day, just returned from xray) Patient left: in bed;with call bell/phone within reach;with bed alarm set Nurse Communication: Mobility status PT Visit Diagnosis: Unsteadiness on feet (R26.81);Muscle weakness (generalized) (M62.81);History of falling (Z91.81)     Time: 1732-1750 PT Time Calculation (min) (ACUTE ONLY): 18 min  Charges:  $Gait Training: 8-22 mins                     Abran Richard, PT Acute Rehab Services Pager [phone] [facility] 09/24/2020, 5:58 PM

## 2020-09-24 NOTE — Progress Notes (Signed)
Family Medicine Teaching Service Daily Progress Note Intern Pager: [phone]  Patient name: Bryan Scott Medical record number: [id number] Date of birth: 12/30/1945 Age: 75 y.o. Gender: male  Primary Care Provider: [facility] Consultants: Cardiology, GI  Code Status: Full  Pt Overview and Major Events to Date:  4/4: Admitted  4/7: EGD performed   Assessment and Plan: Bryan Scott is a 75 year old male presenting with nausea, vomiting, abrasions and contusions secondary to syncopal episode. PMH includes HTN, T2DM, A. fib, CAD s/p CABG 2007 and s/p coronary stent 2002, HLD, spinal stenosis and history of tobacco use.  Syncope Improved. Patient endorses wanting to regain strength prior to returning homem believes SNF is a better option since he lives alone. PT recommends SNF, OT recommends HH/supervision for 24 hours. -Continue imdur -Continuous cardiac monitoring -Encourage p.o. intake as tolerated -PT OT eval and treat -Up with assistance -TOC for SNF placement  Gastroparesis Nausea and vomiting EGD performed 4/7, normal overall.  -Full liquid diet, advance as tolerated -Per GI, will continue metoclopramide, Carafate, Protonix -Zofran ODT q8h prn  -Nutrition consulted, ensure enlive tid, providing 350 kcal and 20 g protein each supplement   CAD s/p CABG 2007 NSTEMIs/p coronary stent 2022HFrEF P. Mitralis -Cardiology consulted, appreciate ongoing involvement and recommendations -Continuing home atorvastatin, Imdur -Xarelto, Plavix restarted 4/8  Skin abrasions  bruisingon long term AC use -Wound care per nursing  Type 2 DM Most recent CBG this morning 141.  -sSSI -Holding home Jardiance and Metformin, will restart on discharge  Hypokalemia K 3.3 this morning. -KCl 40 mEq ordered -monitor daily BMP  Hypertension Hypertensive this morning, 163/106.  -continue home coreg -continue home imdur   Chronic pain syndrome  Spinal  stenosis At home on gabapentin 100 mg 3 times daily daily. Chronic pain likely exacerbated by ecchymosis and swelling on back and arm from recent fall on admission. Endorsing more pain this morning, tender along area of ecchymosis.  -Tylenol 650 mg q6h -Home gabapentin 100 mg 3 times daily -OxyIR 5 mg q6h, continue to wean down as able  -Ice or heat as needed  -Lidocaine patch as needed -Offer rib binding elastics for comfort -Encourage ambulation, physical activity  Hyperlipidemia -continue home atorvastatin  -continue home zetia   FEN/GI: regular  PPx: plavix, xarelto    Status is: Inpatient    Dispo:  Patient From: [place]  Planned Disposition: [place]  Medically stable for discharge: Yes          Subjective:  No acute overnight events reported. Working with occupational therapy this morning. Denies chest pain, dyspnea but endorsing back pain secondary to his ecchymosis. States that he is getting to the point where the pain is unable to be tolerated.   Objective: Temp:  [97.7 F (36.5 C)-98.4 F (36.9 C)] 97.7 F (36.5 C) (04/11 0502) Pulse Rate:  [70-82] 74 (04/11 0502) Resp:  [16-18] 18 (04/11 0502) BP: (102-163)/(69-106) 163/106 (04/11 0550) SpO2:  [95 %-97 %] 97 % (04/11 0502) Weight:  [77.9 kg-78 kg] 78 kg (04/11 0502) Physical Exam: General: Patient standing while working with OT, in no acute distress. Cardiovascular: RRR, no murmurs or gallops auscultated  Respiratory: CTAB, no rales or rhonchi Abdomen: soft, nontender, BS+ MSK: apparent area of ecchymosis lateral to spine of right thoracic area, tender on very light palpation Extremities: radial and distal pulses strong and equal bilaterally, no LE edema bilaterally Neuro: normal gait Psych: mood appropriate   Laboratory: Recent Labs  Lab 09/19/20 1728  09/21/20 0449 09/24/20 0338  WBC 8.0 9.4 7.6  HGB 14.6 15.8 16.2  HCT 41.8 45.1 45.6  PLT 176 186 162    Recent Labs  Lab 09/18/20 0328 09/18/20 1943 09/19/20 0118 09/20/20 0034 09/21/20 0449 09/23/20 0918 09/23/20 1108 09/24/20 0338  NA  --    < > 133* 133*   < > 128* 132* 132*  K  --    < > 3.1* 3.3*   < > 5.5* 3.4* 3.3*  CL  --    < > 102 103   < > 99 100 101  CO2  --    < > 26 24   < > 21* 23 24  BUN  --    < > 19 15   < > 14 12 16   CREATININE  --    < > 1.13 0.98   < > 0.93 0.95 1.12  CALCIUM  --    < > 8.3* 8.3*   < > 9.0 9.2 9.2  PROT 7.0  --  5.5* 6.0*  --   --   --   --   BILITOT 2.8*  --  1.8* 1.5*  --   --   --   --   ALKPHOS 69  --  59 68  --   --   --   --   ALT 23  --  16 17  --   --   --   --   AST 20  --  16 19  --   --   --   --   GLUCOSE  --    < > 110* 181*   < > 197* 171* 158*   < > = values in this interval not displayed.      Imaging/Diagnostic Tests: No results found.  Donney Dice, DO 09/24/2020, 6:55 AM PGY-1, [place] Intern pager: [phone], text pages welcome

## 2020-09-24 NOTE — Progress Notes (Signed)
PT called RN into the room and stated that he was going to pass out. Tracey, NT started taking pts VS and all were WNL. Transferred pt from chair to the bed and he was more comfortable no CP MD paged and was on the phone and updated about the pt VS and current state. MD stated to watch the patient he was orthostatic earlier.

## 2020-09-24 NOTE — TOC Progression Note (Signed)
Transition of Care [facility]) - Progression Note    Patient Details  Name: Bryan Scott MRN: [id number] Date of Birth: February 20, 1946  Transition of Care [facility]) CM/SW Contact  Jacalyn Lefevre Edson Snowball, RN Phone Number: 09/24/2020, 1:23 PM  Clinical Narrative:     PAtient agreeable to SNF. SNF bed offers given . Patient would like [facility]. Kitty at [facility] accepted. Patient will need covid swab.   Insurance authorization started REf [id number] fax [phone]    Expected Discharge Plan: [place] Barriers to Discharge: SNF Pending bed offer  Expected Discharge Plan and Services Expected Discharge Plan: [place]   Discharge Planning Services: CM Consult Post Acute Care Choice: [place] arrangements for the past 2 months: Single Family Home                 DME Arranged: Hospital bed DME Agency: [employer] Date DME Agency Contacted: 09/22/20 Time DME Agency Contacted: [phone] Representative spoke with at DME Agency: [place]: RN,PT,OT,Nurse's Aide,Social Work [facility] Agency: [facility] Date [place]: 09/22/20 Time [place]: 1430 Representative spoke with at [place]: [place] ([place]) Interventions    Readmission Risk Interventions No flowsheet data found.

## 2020-09-24 NOTE — Progress Notes (Signed)
PT kept complaining about his IV hurting and was bleeding around the site. RN removed the IV and notified FM resident to ask if they would like another one placed or would it be okay, stated she would give me a call back if he needed another one if not it was fine.

## 2020-09-25 DIAGNOSIS — E1143 Type 2 diabetes mellitus with diabetic autonomic (poly)neuropathy: Secondary | ICD-10-CM | POA: Diagnosis not present

## 2020-09-25 DIAGNOSIS — K3184 Gastroparesis: Secondary | ICD-10-CM | POA: Diagnosis not present

## 2020-09-25 LAB — CBC
HCT: 42.5 % (ref 39.0–52.0)
Hemoglobin: 15.1 g/dL (ref 13.0–17.0)
MCH: 33.3 pg (ref 26.0–34.0)
MCHC: 35.5 g/dL (ref 30.0–36.0)
MCV: 93.6 fL (ref 80.0–100.0)
Platelets: 160 10*3/uL (ref 150–400)
RBC: 4.54 MIL/uL (ref 4.22–5.81)
RDW: 13.3 % (ref 11.5–15.5)
WBC: 6.6 10*3/uL (ref 4.0–10.5)
nRBC: 0 % (ref 0.0–0.2)

## 2020-09-25 LAB — BASIC METABOLIC PANEL
Anion gap: 7 (ref 5–15)
BUN: 17 mg/dL (ref 8–23)
CO2: 23 mmol/L (ref 22–32)
Calcium: 9.1 mg/dL (ref 8.9–10.3)
Chloride: 102 mmol/L (ref 98–111)
Creatinine, Ser: 1.11 mg/dL (ref 0.61–1.24)
GFR, Estimated: >60 mL/min (ref >60)
Glucose, Bld: 233 mg/dL — ABNORMAL HIGH (ref 70–99)
Potassium: 3.5 mmol/L (ref 3.5–5.1)
Sodium: 132 mmol/L — ABNORMAL LOW (ref 135–145)

## 2020-09-25 LAB — GLUCOSE, CAPILLARY
Glucose-Capillary: 174 mg/dL — ABNORMAL HIGH (ref 70–99)
Glucose-Capillary: 226 mg/dL — ABNORMAL HIGH (ref 70–99)

## 2020-09-25 MED ORDER — ACETAMINOPHEN 325 MG PO TABS: 650.0000 mg | ORAL_TABLET | Freq: Four times a day (QID) | ORAL | Status: DC

## 2020-09-25 MED ORDER — POLYETHYLENE GLYCOL 3350 17 G PO PACK: 17.0000 g | PACK | Freq: Every day | ORAL | 0 refills | Status: AC

## 2020-09-25 MED ORDER — OXYCODONE HCL 5 MG PO TABS: 2.5000 mg | ORAL_TABLET | Freq: Four times a day (QID) | ORAL | 0 refills | Status: DC | PRN

## 2020-09-25 MED ORDER — ONDANSETRON 4 MG PO TBDP: 4.0000 mg | ORAL_TABLET | Freq: Three times a day (TID) | ORAL | 0 refills | Status: DC | PRN

## 2020-09-25 MED ORDER — METOCLOPRAMIDE HCL 10 MG PO TABS: 10.0000 mg | ORAL_TABLET | Freq: Three times a day (TID) | ORAL | 0 refills | Status: DC

## 2020-09-25 NOTE — Progress Notes (Signed)
Family Medicine Teaching Service Daily Progress Note Intern Pager: [phone]  Patient name: Bryan Scott Medical record number: [id number] Date of birth: 1945-11-14 Age: 75 y.o. Gender: male  Primary Care Provider: [place] Consultants: Cardiology, GI Code Status: Full  Pt Overview and Major Events to Date:  4/4: Admitted 4/7: EGD performed  Assessment and Plan: Mr. Woolstenhulme is a 75 year old male presenting with nausea, vomiting, abrasions and contusions secondary to syncopal episode. PMH includes HTN, T2DM, A. fib, CAD s/p CABG 2007 and s/p coronary stent 2002, HLD, spinal stenosis and history of tobacco use. Medically stable for discharge to SNF.   Syncope Resolved. Patient endorses wanting to regain strength prior to returning homem believes SNF is a better option since he lives alone. PT recommends SNF, OT recommends HH/supervision for 24 hours. -Continue imdur -Continuous cardiac monitoring -Encourage p.o. intake as tolerated -PT OT eval and treat -Up with assistance -TOC consult placed for SNF placement -COVID testing ordered for SNF placement: negative   Gastroparesis Nausea and vomiting EGD performed 4/7, normal overall.  -Full liquid diet, advance as tolerated -Per GI, will continue metoclopramide, Carafate, Protonix -Zofran ODT q8h prn  -Nutrition consulted, ensure enlive tid, providing 350 kcal and 20 g protein each supplement   CAD s/p CABG 2007 NSTEMIs/p coronary stent 2022HFrEF P. Mitralis -Cardiology consulted, appreciate ongoing involvement and recommendations -Continuing home atorvastatin, Imdur -Xarelto, Plavix restarted 4/8  Skin abrasions  bruisingon long term AC use -Wound care per nursing  Type 2 DM Most recent CBG this morning 174.  -sSSI -Holding home Jardiance and Metformin, will restart on discharge  Hypokalemia Resolved. K 3.5.  -monitor daily BMP  Hypertension Normotensive this morning, 114/75.   -continue home coreg -continue home imdur   Chronic pain syndrome  Spinal stenosis Ecchymosis  At home on gabapentin 100 mg 3 times daily daily. Chronic pain likely exacerbated by ecchymosis from fall on admission. Right rib x-ray noted to have no fracture or other bone lesions.  -Tylenol 650 mg q6h -Home gabapentin 100 mg 3 times daily -OxyIR 5 mg q6h, continue to wean down as able  -Ice or heat as needed  -Lidocaine patch as needed -Offer rib binding elastics for comfort -Encourage ambulation, physical activity  Hyperlipidemia -continue home atorvastatin  -continue home zetia  FEN/GI: regular PPx: plavix, xarelto    Status is: Inpatient    Dispo:  Patient From: [place]  Planned Disposition: [place]  Medically stable for discharge: Yes          Subjective:  No acute overnight events reported. Denies chest pain and dyspnea. States that he feels much better today.   Objective: Temp:  [97.6 F (36.4 C)-99 F (37.2 C)] 98.1 F (36.7 C) (04/12 0439) Pulse Rate:  [63-80] 64 (04/12 0439) Resp:  [16-18] 16 (04/12 0439) BP: (107-148)/(64-87) 114/75 (04/12 0439) SpO2:  [95 %-98 %] 96 % (04/12 0439) Weight:  [79.7 kg] 79.7 kg (04/12 0439) Physical Exam: General: Patient well-appearing, sitting in the chair, in no acute distress. Cardiovascular: RRR, no murmurs or gallops auscultated Respiratory: CTAB, no rales or rhonchi noted Abdomen: soft, nontender, BS+ MSK: improved ecchymosis of right thoracic area lateral to spine  Extremities: radial and distal pulses strong and equal bilaterally, no LE edema noted bilaterally Psych: mood appropriate, very pleasant   Laboratory: Recent Labs  Lab 09/21/20 0449 09/24/20 0338 09/25/20 0140  WBC 9.4 7.6 6.6  HGB 15.8 16.2 15.1  HCT 45.1 45.6 42.5  PLT 186  162 160   Recent Labs  Lab 09/19/20 0118 09/20/20 0034 09/21/20 0449 09/23/20 1108 09/24/20 0338 09/25/20 0140  NA  133* 133*   < > 132* 132* 132*  K 3.1* 3.3*   < > 3.4* 3.3* 3.5  CL 102 103   < > 100 101 102  CO2 26 24   < > 23 24 23   BUN 19 15   < > 12 16 17   CREATININE 1.13 0.98   < > 0.95 1.12 1.11  CALCIUM 8.3* 8.3*   < > 9.2 9.2 9.1  PROT 5.5* 6.0*  --   --   --   --   BILITOT 1.8* 1.5*  --   --   --   --   ALKPHOS 59 68  --   --   --   --   ALT 16 17  --   --   --   --   AST 16 19  --   --   --   --   GLUCOSE 110* 181*   < > 171* 158* 233*   < > = values in this interval not displayed.      Imaging/Diagnostic Tests: DG Ribs Unilateral Right  Result Date: 09/24/2020 CLINICAL DATA:  And lower right rib pain after a fall week ago. EXAM: RIGHT RIBS - 2 VIEW COMPARISON:  Chest x-ray dated September 17, 2020. CTA chest dated September 03, 2020. FINDINGS: No fracture or other bone lesions are seen involving the ribs. Unchanged right nephrolithiasis. IMPRESSION: 1. Negative right ribs. 2. Unchanged right nephrolithiasis. Electronically Signed   By: Titus Dubin M.D.   On: 09/24/2020 19:31    Donney Dice, DO 09/25/2020, 6:54 AM PGY-1, [place] Intern pager: [phone], text pages welcome

## 2020-09-25 NOTE — Progress Notes (Signed)
Inpatient Diabetes Program Recommendations  AACE/ADA: New Consensus Statement on Inpatient Glycemic Control (2015)  Target Ranges:  Prepandial:   less than 140 mg/dL      Peak postprandial:   less than 180 mg/dL (1-2 hours)      Critically ill patients:  140 - 180 mg/dL   Lab Results  Component Value Date   GLUCAP 174 (H) 09/25/2020   HGBA1C 6.9 (H) 09/18/2020    Review of Glycemic Control Results for [name], [name] (MRN [id number]) as of 09/25/2020 09:21  Ref. Range 09/24/2020 06:13 09/24/2020 11:03 09/24/2020 16:10 09/24/2020 20:57 09/25/2020 05:53  Glucose-Capillary Latest Ref Range: 70 - 99 mg/dL 141 (H) 202 (H) 202 (H) 224 (H) 174 (H)   Diabetes history: DM 2 Outpatient Diabetes medications: Jardiance 25 mg Daily, Metformin 500 mg bid  Current orders for Inpatient glycemic control:  Novolog 0-9 units tid  Ensure Enlive tid between meals  Inpatient Diabetes Program Recommendations:    Pt on 2 oral medications at home. Glucose trends higher than inpatient goal.  - consider increasing Novolog to "moderate" 0-15 units tid  Thanks,  Tama Headings RN, MSN, BC-ADM Inpatient Diabetes Coordinator Team Pager [phone] (8a-5p)

## 2020-09-25 NOTE — Progress Notes (Signed)
Report given to nurse Danae Chen at [facility]. Patient is awaiting for PTAR.

## 2020-09-25 NOTE — Discharge Instructions (Signed)
Dear Bryan Scott,  Thank you for letting us participate in your care. You were hospitalized for nausea, vomiting, and dizziness.   POST-HOSPITAL & CARE INSTRUCTIONS 1. Follow up with your primary care provider within about one week or discharge. This is to ensure you are doing okay with the nausea and vomiting.  2. Follow up with your GI doctor and cardiologist within about a month.  3. Keep taking the Reglan for a month, make sure to see your primary care physician for further instructions on this after 1 month.   4. Go to your follow up appointments (listed below)   DOCTOR'S APPOINTMENT   Future Appointments  Date Time Provider [place]  10/03/2020  3:10 PM Theora Gianotti, NP [id number]    Follow-up [place] Follow up.   Specialty: Home Health Services Contact information: [address] [place] [address] [phone]        Theora Gianotti, NP Follow up on 10/03/2020.   Specialties: Nurse Practitioner, Cardiology, Radiology Why: Please arrive 15 min early for your appointment at 3:10 PM, post hospital cardiology appointment  Contact information: [place] [address] [phone]        Ladene Artist, MD. Schedule an appointment as soon as possible for a visit in 1 month(s).   Specialty: Gastroenterology Why: Make an appointment to follow up with GI.  Contact information: [address] [address] [phone]               Take care and be well!  [facility]  [place], [place] [address] [phone]

## 2020-09-25 NOTE — TOC Progression Note (Addendum)
Transition of Care [facility]) - Progression Note    Patient Details  Name: Bryan Scott MRN: [id number] Date of Birth: January 28, 1946  Transition [facility] Care [facility]) CM/SW Contact  Jacalyn Lefevre Edson Snowball, RN Phone Number: 09/25/2020, 10:34 AM  Clinical Narrative:     Patient has insurance authorization for SNF . [facility] ready for admission today. Messaged MD. Will need discharge summary   1345 Has authorization to go to [facility] , discharge summary complete. [facility] ready to admit patient.   Nurse to call report to [phone] and ask for 200 hall nurse , patients room number is 216 .  Expected Discharge Plan: Skilled Nursing Facility Barriers to Discharge: SNF Pending bed offer  Expected Discharge Plan and Services Expected Discharge Plan: [place]   Discharge Planning Services: CM Consult Post Acute Care Choice: [place] arrangements for the past 2 months: Single Family Home                 DME Arranged: Hospital bed DME Agency: [employer] Date DME Agency Contacted: 09/22/20 Time DME Agency Contacted: [phone] Representative spoke with at DME Agency: [place] Choice: RN,PT,OT,Nurse's Aide,Social Work [facility] Agency: [facility] Date [place]: 09/22/20 Time [place]: 1430 Representative spoke with at [place]: [place] ([place]) Interventions    Readmission Risk Interventions No flowsheet data found.

## 2020-09-26 ENCOUNTER — Encounter: Payer: Self-pay | Admitting: Adult Health

## 2020-09-26 ENCOUNTER — Non-Acute Institutional Stay (SKILLED_NURSING_FACILITY): Payer: Medicare PPO | Admitting: Adult Health

## 2020-09-26 DIAGNOSIS — E1151 Type 2 diabetes mellitus with diabetic peripheral angiopathy without gangrene: Secondary | ICD-10-CM

## 2020-09-26 DIAGNOSIS — I2510 Atherosclerotic heart disease of native coronary artery without angina pectoris: Secondary | ICD-10-CM | POA: Diagnosis not present

## 2020-09-26 DIAGNOSIS — G894 Chronic pain syndrome: Secondary | ICD-10-CM | POA: Diagnosis not present

## 2020-09-26 DIAGNOSIS — K3184 Gastroparesis: Secondary | ICD-10-CM

## 2020-09-26 NOTE — Progress Notes (Signed)
Location:  [place] Room Number: [place] of Service:  SNF (31) Provider:  Durenda Age, DNP, FNP-BC  Patient Care Team: [facility], [place] as PCP - General ([place]) End, Harrell Gave, MD as PCP - Cardiology (Cardiology) [name], MD (Family Medicine)  Extended Emergency Contact Information Primary Emergency Contact: Hinda Kehr Mobile Phone: [phone] Relation: Sister Secondary Emergency Contact: Clydene Pugh Home Phone: [phone] Mobile Phone: [phone] Relation: Sister  Code Status:  Full Code  Goals of care: Advanced Directive information Advanced Directives 09/18/2020  Does Patient Have a Medical Advance Directive? Yes  Type of Advance Directive [place]  Does patient want to make changes to medical advance directive? No - Patient declined  Copy of [place] in Chart? No - copy requested  Would patient like information on creating a medical advance directive? -     Chief Complaint  Patient presents with  . Acute Visit    Follow-up hospitalization    HPI:  Pt is a 75 y.o. male who was admitted to [facility] on 09/25/20 post hospital admission 09/17/20 to 09/25/20. He has a PMH of hypertension, type 2 diabetes mellitus, atrial fibrillation, CAD S/P CABG in 2007 and S/P coronary stent in 2002, hyperlipidemia, spinal stenosis and history of tobacco use.  He presented today hospital with syncopal episodes after micturition and a day of vomiting and diarrhea. Labs sodium 131, K3, glucose 162, leukocytosis of 15.9.  CT head without contrast unremarkable with no acute intracranial abnormality noted.  Shoulder x-ray demonstrated no acute abnormality.  He has history of recurrent falls with 3 falls in the past 2 months.  No noted fracture on extensive x-ray imaging.  EKG demonstrated NSR.  Syncope improved with hydration and control of nausea and  vomiting.  GI was consulted.  EGD done on 4/7 was unremarkable, no masses, erosions or abnormalities.  He was discharged on Protonix, Reglan and sucralfate.   Past Medical History:  Diagnosis Date  . 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 Back injuries   . Bladder cancer ([place])   . Cancer ([place])   . Carotid artery disease (HCC)    > 75% bilateral ICA stenoses on 01/2013 CT  . Chronic pain   . Chronic pain syndrome   . Coronary artery disease    s/p CABG ~ 2007  . 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 GERD (gastroesophageal reflux disease)   . Heparin allergy    Bleeding  . History of tobacco use   . Hx of CABG   . Hx of cardiac cath   . Hyperlipidemia   . Hypertension   . Ischemic cardiomyopathy   . Paroxysmal atrial fibrillation (HCC)    On Xarelto anticoagulation  . Type 2 diabetes mellitus (HCC)    A1C 6.8% in 01/2013   Past Surgical History:  Procedure Laterality Date  . CARDIAC CATHETERIZATION    . CORONARY ARTERY BYPASS GRAFT  2007  . CORONARY STENT INTERVENTION N/A 07/13/2020   Procedure: CORONARY STENT INTERVENTION;  Surgeon: Nelva Bush, MD;  Location: [facility];  Service: Cardiovascular;  Laterality: N/A;  . ESOPHAGOGASTRODUODENOSCOPY (EGD) WITH PROPOFOL N/A 09/20/2020   Procedure: ESOPHAGOGASTRODUODENOSCOPY (EGD) WITH PROPOFOL;  Surgeon: Ladene Artist, MD;  Location[facility] ENDOSCOPY;  Service: Endoscopy;  Laterality: N/A;  . LEFT HEART CATH AND CORS/GRAFTS ANGIOGRAPHY N/A 07/13/2020   Procedure: LEFT HEART CATH AND CORS/GRAFTS ANGIOGRAPHY;  Surgeon: Minna Merritts,  MD;  Location: [place] [facility];  Service: Cardiovascular;  Laterality: N/A;    Allergies  Allergen Reactions  . 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 Aspirin Swelling  . Lisinopril Anaphylaxis  . Heparin Other (See Comments)    Reaction:  Bleeding   . Lisinopril Swelling    Outpatient Encounter Medications as of 09/26/2020  Medication Sig  . acetaminophen (TYLENOL) 325  MG tablet Take 2 tablets (650 mg total) by mouth every 6 (six) hours.  Marland Kitchen amLODipine (NORVASC) 10 MG tablet Take 1 tablet by mouth daily.  Marland Kitchen atorvastatin (LIPITOR) 80 MG tablet Take 80 mg by mouth daily.  Marland Kitchen atorvastatin (LIPITOR) 80 MG tablet Take 80 mg by mouth daily.  . carvedilol (COREG) 3.125 MG tablet Take 1 tablet (3.125 mg total) by mouth 2 (two) times daily with a meal.  . carvedilol (COREG) 3.125 MG tablet Take 3.125 mg by mouth 2 (two) times daily.  . chlorthalidone (HYGROTON) 25 MG tablet Take 1 tablet by mouth daily.  . clopidogrel (PLAVIX) 75 MG tablet Take 1 tablet (75 mg total) by mouth daily with breakfast.  . clopidogrel (PLAVIX) 75 MG tablet Take 75 mg by mouth every morning.  Marland Kitchen Dextromethorphan-guaiFENesin (ROBITUSSIN DM PO) Take 5 mLs by mouth every 4 (four) hours as needed.  . empagliflozin (JARDIANCE) 25 MG TABS tablet Take 25 mg by mouth daily.  . empagliflozin (JARDIANCE) 25 MG TABS tablet Take 25 mg by mouth daily.  Marland Kitchen ezetimibe (ZETIA) 10 MG tablet Take 1 tablet (10 mg total) by mouth daily.  Marland Kitchen ezetimibe (ZETIA) 10 MG tablet Take 10 mg by mouth daily.  . fluticasone (FLONASE) 50 MCG/ACT nasal spray   . gabapentin (NEURONTIN) 100 MG capsule Take 100 mg by mouth 3 (three) times daily.  Marland Kitchen gabapentin (NEURONTIN) 100 MG capsule Take 100 mg by mouth 3 (three) times daily.  Marland Kitchen glipiZIDE (GLUCOTROL) 10 MG tablet Take 10 mg by mouth 2 (two) times daily before a meal.  . guaiFENesin-dextromethorphan (ROBITUSSIN DM) 100-10 MG/5ML syrup Take 5 mLs by mouth every 4 (four) hours as needed for cough.  . isosorbide mononitrate (IMDUR) 30 MG 24 hr tablet Take 1 tablet (30 mg total) by mouth daily.  . isosorbide mononitrate (IMDUR) 30 MG 24 hr tablet Take 30 mg by mouth daily.  Marland Kitchen lactulose (CHRONULAC) 10 GM/15ML solution Take 30 mLs by mouth daily as needed for mild constipation.  . Lactulose 20 GM/30ML SOLN Take 30 mLs (20 g total) by mouth daily as needed.  . liraglutide (VICTOZA) 18  MG/3ML SOPN INJECT 0.6MG  UNDER SKIN EVERY DAY FOR 7 DAYS, THEN INJECT 1.2MG  EVERY DAY FOR DIABETES  . metFORMIN (GLUCOPHAGE) 500 MG tablet Take 500 mg by mouth 2 (two) times daily with a meal.  . metFORMIN (GLUMETZA) 500 MG (MOD) 24 hr tablet Take 500 mg by mouth 2 (two) times daily with a meal.  . metoCLOPramide (REGLAN) 10 MG tablet Take 1 tablet (10 mg total) by mouth 4 (four) times daily -  before meals and at bedtime.  Marland Kitchen morphine (MS CONTIN) 15 MG 12 hr tablet Take 15 mg by mouth every 12 (twelve) hours as needed for pain. (Patient not taking: Reported on 09/03/2020)  . Multiple Vitamin (MULTIVITAMIN WITH MINERALS) TABS tablet Take 1 tablet by mouth daily.  . Multiple Vitamins-Minerals (MULTIVITAMIN WITH MINERALS) tablet Take 1 tablet by mouth daily.  . nitroGLYCERIN (NITROSTAT) 0.4 MG SL tablet Place 1 tablet (0.4 mg total) under the tongue every 5 (five) minutes as  needed for chest pain.  . nitroGLYCERIN (NITROSTAT) 0.4 MG SL tablet Place 0.4 mg under the tongue every 5 (five) minutes as needed for chest pain.  . Nutritional Supplements (,FEEDING SUPPLEMENT, PROSOURCE PLUS) liquid Take 30 mLs by mouth 2 (two) times daily between meals.  . Nutritional Supplements (ENSURE ORIGINAL PO) Take 237 mLs by mouth every evening. strawberry  . ondansetron (ZOFRAN) 8 MG tablet Take 1 tablet by mouth every 8 (eight) hours as needed.  . ondansetron (ZOFRAN-ODT) 4 MG disintegrating tablet Take 1 tablet (4 mg total) by mouth every 8 (eight) hours as needed for nausea or vomiting.  Marland Kitchen oxyCODONE (OXY IR/ROXICODONE) 5 MG immediate release tablet Take 0.5 tablets (2.5 mg total) by mouth every 6 (six) hours as needed for up to 5 days for severe pain.  . pantoprazole (PROTONIX) 40 MG tablet Take 1 tablet (40 mg total) by mouth daily.  . pantoprazole (PROTONIX) 40 MG tablet Take 40 mg by mouth daily.  . polyethylene glycol (MIRALAX / GLYCOLAX) 17 g packet Take 17 g by mouth daily.  . polyethylene glycol powder  (GLYCOLAX/MIRALAX) 17 GM/SCOOP powder MIX 17GM (1 CAPFUL) BY MOUTH EVERY DAY MIX 17 GRAMS (USE BOTTLE CAP) IN LIQUID OF CHOICE AS DIRECTED  . rivaroxaban (XARELTO) 20 MG TABS tablet Take 1 tablet (20 mg total) by mouth daily with supper.  . senna-docusate (SENOKOT-S) 8.6-50 MG tablet Take 2 tablets by mouth 2 (two) times daily.  . SENNA-PLUS 8.6-50 MG tablet Take 2 tablets by mouth 2 (two) times daily.  Marland Kitchen spironolactone (ALDACTONE) 25 MG tablet Take 1 tablet by mouth daily.  . sucralfate (CARAFATE) 1 g tablet Take 1 tablet (1 g total) by mouth 4 (four) times daily -  with meals and at bedtime.  . sucralfate (CARAFATE) 1 g tablet Take 1 g by mouth 4 (four) times daily.  . tamsulosin (FLOMAX) 0.4 MG CAPS capsule Take 1 capsule by mouth daily.  . Tetrahydrozoline HCl (VISINE OP) Place 1 drop into both eyes daily as needed (dry red eyes).  Alveda Reasons 20 MG TABS tablet Take 20 mg by mouth daily.   No facility-administered encounter medications on file as of 09/26/2020.    Review of Systems  GENERAL: No change in appetite, no fatigue, no weight changes, no fever, chills or weakness MOUTH and THROAT: Denies oral discomfort, gingival pain or bleeding RESPIRATORY: no cough, SOB, DOE, wheezing, hemoptysis CARDIAC: No chest pain, edema or palpitations GI: No abdominal pain, diarrhea, constipation, heart burn, nausea or vomiting GU: Denies dysuria, frequency, hematuria, incontinence, or discharge NEUROLOGICAL: Denies dizziness, syncope, numbness, or headache PSYCHIATRIC: Denies feelings of depression or anxiety. No report of hallucinations, insomnia, paranoia, or agitation   Immunization History  Administered Date(s) Administered  . DTaP 04/19/2008  . Dtap, Unspecified 04/19/2008  . Influenza-Unspecified 04/25/1997, 03/06/2019, 03/23/2020, 04/17/2020  . Janssen (J&J) SARS-COV-2 Vaccination 10/21/2019, 04/24/2020  . PPD Test 09/24/2016  . Pneumococcal Conjugate-13 04/19/2015  . Pneumococcal  Polysaccharide-23 02/15/2008, 04/30/2016  . Td 04/25/1997, 10/27/2017  . Tdap 04/19/2008  . Zoster 07/30/2011  . Zoster Recombinat (Shingrix) 05/03/2020, 07/10/2020   Pertinent  Health Maintenance Due  Topic Date Due  . FOOT EXAM  Never done  . OPHTHALMOLOGY EXAM  Never done  . URINE MICROALBUMIN  Never done  . COLONOSCOPY (Pts 45-48yrs Insurance coverage will need to be confirmed)  Never done  . INFLUENZA VACCINE  01/14/2021  . HEMOGLOBIN A1C  03/20/2021  . PNA vac Low Risk Adult  Completed  No flowsheet data found.   Vitals:   09/26/20 1000  BP: 139/82  Pulse: 76  Resp: 18  Temp: (!) 97.5 F (36.4 C)  Weight: 177 lb 3.2 oz (80.4 kg)  Height: 6' (1.829 m)   Body mass index is 24.03 kg/m.  Physical Exam  GENERAL APPEARANCE: Well nourished. In no acute distress. Normal body habitus SKIN: Bruising on bilateral arms and left lower back (sustained after falling due to syncope) MOUTH and THROAT: Lips are without lesions. Oral mucosa is moist and without lesions. Tongue is normal in shape, size, and color and without lesions RESPIRATORY: Breathing is even & unlabored, BS CTAB CARDIAC: RRR, no murmur,no extra heart sounds, no edema GI: Abdomen soft, normal BS, no masses, no tenderness NEUROLOGICAL: There is no tremor. Speech is clear. Alert and oriented X 3. PSYCHIATRIC:  Affect and behavior are appropriate  Labs reviewed: Recent Labs    07/19/20 0531 07/20/20 0508 08/07/20 1538 09/06/20 0557 09/17/20 1450 09/17/20 2247 09/18/20 0135 09/18/20 1943 09/19/20 0118 09/19/20 0836 09/20/20 0034 09/23/20 1108 09/24/20 0338 09/25/20 0140  NA 133* 130*   < > 135   < >  --    < > 134*   < >  --    < > 132* 132* 132*  K 4.0 3.7   < > 3.4*   < >  --    < > 3.5   < >  --    < > 3.4* 3.3* 3.5  CL 101 102   < > 104   < >  --    < > 103   < >  --    < > 100 101 102  CO2 24 23   < > 21*   < >  --    < > 25   < >  --    < > 23 24 23   GLUCOSE 198* 154*   < > 96   < >  --    < >  184*   < >  --    < > 171* 158* 233*  BUN 28* 28*   < > 14   < >  --    < > 21   < >  --    < > 12 16 17   CREATININE 1.06 0.93   < > 1.00   < >  --    < > 1.31*   < >  --    < > 0.95 1.12 1.11  CALCIUM 9.3 9.0   < > 8.8*   < >  --    < > 8.2*   < >  --    < > 9.2 9.2 9.1  MG 1.8 1.8   < > 1.7  --  1.7  --   --   --  1.8  --   --   --   --   PHOS 2.7 3.1  --   --   --   --   --  2.7  --   --   --   --   --   --    < > = values in this interval not displayed.   Recent Labs    09/18/20 0328 09/18/20 1943 09/19/20 0118 09/20/20 0034  AST 20  --  16 19  ALT 23  --  16 17  ALKPHOS 69  --  59 68  BILITOT 2.8*  --  1.8* 1.5*  PROT 7.0  --  5.5* 6.0*  ALBUMIN 3.5 2.8* 2.9* 3.2*   Recent Labs    09/06/20 0557 09/17/20 1450 09/18/20 0135 09/19/20 0118 09/19/20 0836 09/21/20 0449 09/24/20 0338 09/25/20 0140  WBC 8.3 15.9*   < > 9.9   < > 9.4 7.6 6.6  NEUTROABS 4.9 12.5*  --  5.7  --   --   --   --   HGB 14.6 20.1*   < > 13.9   < > 15.8 16.2 15.1  HCT 41.8 55.8*   < > 39.2   < > 45.1 45.6 42.5  MCV 95.0 92.4   < > 93.6   < > 95.8 93.1 93.6  PLT 161 247   < > 174   < > 186 162 160   < > = values in this interval not displayed.   Lab Results  Component Value Date   TSH 0.730 09/09/2019   Lab Results  Component Value Date   HGBA1C 6.9 (H) 09/18/2020   Lab Results  Component Value Date   CHOL 95 08/08/2020   HDL 42 08/08/2020   LDLCALC 29 08/08/2020   TRIG 121 08/08/2020   CHOLHDL 2.3 08/08/2020    Significant Diagnostic Results in last 30 days:  DG Chest 1 View  Result Date: 09/17/2020 CLINICAL DATA:  Fall, right shoulder pain. EXAM: CHEST  1 VIEW COMPARISON:  None. FINDINGS: Prior CABG. Heart and mediastinal contours are within normal limits. No focal opacities or effusions. No acute bony abnormality. No visible displaced rib fracture or pneumothorax. IMPRESSION: No active disease. Electronically Signed   By: Rolm Baptise M.D.   On: 09/17/2020 18:02   DG Ribs Unilateral  Right  Result Date: 09/24/2020 CLINICAL DATA:  And lower right rib pain after a fall week ago. EXAM: RIGHT RIBS - 2 VIEW COMPARISON:  Chest x-ray dated September 17, 2020. CTA chest dated September 03, 2020. FINDINGS: No fracture or other bone lesions are seen involving the ribs. Unchanged right nephrolithiasis. IMPRESSION: 1. Negative right ribs. 2. Unchanged right nephrolithiasis. Electronically Signed   By: Titus Dubin M.D.   On: 09/24/2020 19:31   DG Lumbar Spine 2-3 Views  Result Date: 09/17/2020 CLINICAL DATA:  Back pain, fall EXAM: LUMBAR SPINE - 2-3 VIEW COMPARISON:  None. FINDINGS: Partial fusion across the L3-4, L4-5 and L5-S1 disc spaces. Normal alignment. Moderate to advanced degenerative disc and facet disease diffusely. No fracture. IMPRESSION: Diffuse degenerative disc and facet disease. No acute bony abnormality. Electronically Signed   By: Rolm Baptise M.D.   On: 09/17/2020 18:01   DG Pelvis 1-2 Views  Result Date: 09/17/2020 CLINICAL DATA:  Fall, low back pain radiating to right side. EXAM: PELVIS - 1-2 VIEW COMPARISON:  None. FINDINGS: Symmetric degenerative changes in the hips. Degenerative changes in the visualized lower lumbar spine. No acute bony abnormality. Specifically, no fracture, subluxation, or dislocation. IMPRESSION: No acute bony abnormality. Electronically Signed   By: Rolm Baptise M.D.   On: 09/17/2020 18:00   DG Shoulder Right  Result Date: 09/17/2020 CLINICAL DATA:  Right shoulder pain EXAM: RIGHT SHOULDER - 2+ VIEW COMPARISON:  None. FINDINGS: There is no evidence of fracture or dislocation. There is diffuse osteopenia. Moderate AC and glenohumeral joint osteoarthritis is seen with joint space loss. Mild overlying soft tissue swelling is seen. IMPRESSION: No acute osseous abnormality. Electronically Signed   By: Prudencio Pair M.D.   On: 09/17/2020 18:00   DG Forearm Left  Result Date: 09/19/2020 CLINICAL DATA:  Pain following  recent fall EXAM: LEFT FOREARM - 2 VIEW  COMPARISON:  None. FINDINGS: Frontal and lateral views were obtained. No appreciable fracture or dislocation. Joint spaces appear normal. No erosive change. IMPRESSION: No fracture or dislocation.  No appreciable arthropathy. Electronically Signed   By: Lowella Grip III M.D.   On: 09/19/2020 13:27   CT HEAD WO CONTRAST  Result Date: 09/18/2020 CLINICAL DATA:  Syncope EXAM: CT HEAD WITHOUT CONTRAST TECHNIQUE: Contiguous axial images were obtained from the base of the skull through the vertex without intravenous contrast. COMPARISON:  08/07/2020 FINDINGS: Brain: No acute intracranial abnormality. Specifically, no hemorrhage, hydrocephalus, mass lesion, acute infarction, or significant intracranial injury. Vascular: No hyperdense vessel or unexpected calcification. Skull: No acute calvarial abnormality. Sinuses/Orbits: No acute findings Other: None IMPRESSION: No acute intracranial abnormality. Electronically Signed   By: Rolm Baptise M.D.   On: 09/18/2020 00:31   CT Angio Chest PE W and/or Wo Contrast  Result Date: 09/03/2020 CLINICAL DATA:  Centralized chest pain radiating to left arm with nausea and vomiting. Recent cardiac stent placement 2 months ago. EXAM: CT ANGIOGRAPHY CHEST CT ABDOMEN AND PELVIS WITH CONTRAST TECHNIQUE: Multidetector CT imaging of the chest was performed using the standard protocol during bolus administration of intravenous contrast. Multiplanar CT image reconstructions and MIPs were obtained to evaluate the vascular anatomy. Multidetector CT imaging of the abdomen and pelvis was performed using the standard protocol during bolus administration of intravenous contrast. CONTRAST:  38mL OMNIPAQUE IOHEXOL 350 MG/ML SOLN COMPARISON:  CT a chest, abdomen, and pelvis dated December 12, 2019. FINDINGS: CTA CHEST FINDINGS Cardiovascular: Satisfactory opacification of the pulmonary arteries to the segmental level. No evidence of pulmonary embolism. Normal heart size. No pericardial effusion.  Prior CABG. Coronary, aortic arch, and branch vessel atherosclerotic vascular disease. Mediastinum/Nodes: No enlarged mediastinal, hilar, or axillary lymph nodes. Thyroid gland, trachea, and esophagus demonstrate no significant findings. Lungs/Pleura: Unchanged biapical pleuroparenchymal scarring. No focal consolidation, pleural effusion, or pneumothorax. No suspicious pulmonary nodule. Musculoskeletal: Unchanged mild bilateral gynecomastia. No acute or significant osseous findings. Review of the MIP images confirms the above findings. CT ABDOMEN AND PELVIS FINDINGS Hepatobiliary: No focal liver abnormality is seen. No gallstones or gallbladder wall thickening. Mild intra- and extrahepatic biliary dilatation. Common bile duct measures 10 mm in diameter. Pancreas: Unchanged mild prominence of the main pancreatic duct. No surrounding inflammatory changes. Spleen: Normal in size without focal abnormality. Adrenals/Urinary Tract: The adrenal glands are unremarkable. Unchanged 11 mm right renal calculus. No hydronephrosis or renal mass. The bladder is unremarkable. Stomach/Bowel: Stomach is within normal limits. Appendix appears normal. No evidence of bowel wall thickening, distention, or inflammatory changes. Vascular/Lymphatic: Aortic atherosclerosis. No enlarged abdominal or pelvic lymph nodes. Reproductive: Prostate is unremarkable. Other: No abdominal wall hernia or abnormality. No abdominopelvic ascites. No pneumoperitoneum. Musculoskeletal: No acute or significant osseous findings. Review of the MIP images confirms the above findings. IMPRESSION: 1. No evidence of pulmonary embolism. No acute intrathoracic process. 2. Mild intra- and extrahepatic biliary dilatation with common bile duct measuring 10 mm in diameter. Correlation with LFTs is recommended. If there is clinical concern for biliary obstruction, consider further evaluation with MRCP or ERCP. 3. Unchanged 11 mm nonobstructive right nephrolithiasis. 4.  Aortic Atherosclerosis (ICD10-I70.0). Electronically Signed   By: Titus Dubin M.D.   On: 09/03/2020 12:03   CT ABDOMEN PELVIS W CONTRAST  Result Date: 09/03/2020 CLINICAL DATA:  Centralized chest pain radiating to left arm with nausea and vomiting. Recent cardiac stent placement 2 months ago. EXAM:  CT ANGIOGRAPHY CHEST CT ABDOMEN AND PELVIS WITH CONTRAST TECHNIQUE: Multidetector CT imaging of the chest was performed using the standard protocol during bolus administration of intravenous contrast. Multiplanar CT image reconstructions and MIPs were obtained to evaluate the vascular anatomy. Multidetector CT imaging of the abdomen and pelvis was performed using the standard protocol during bolus administration of intravenous contrast. CONTRAST:  70mL OMNIPAQUE IOHEXOL 350 MG/ML SOLN COMPARISON:  CT a chest, abdomen, and pelvis dated December 12, 2019. FINDINGS: CTA CHEST FINDINGS Cardiovascular: Satisfactory opacification of the pulmonary arteries to the segmental level. No evidence of pulmonary embolism. Normal heart size. No pericardial effusion. Prior CABG. Coronary, aortic arch, and branch vessel atherosclerotic vascular disease. Mediastinum/Nodes: No enlarged mediastinal, hilar, or axillary lymph nodes. Thyroid gland, trachea, and esophagus demonstrate no significant findings. Lungs/Pleura: Unchanged biapical pleuroparenchymal scarring. No focal consolidation, pleural effusion, or pneumothorax. No suspicious pulmonary nodule. Musculoskeletal: Unchanged mild bilateral gynecomastia. No acute or significant osseous findings. Review of the MIP images confirms the above findings. CT ABDOMEN AND PELVIS FINDINGS Hepatobiliary: No focal liver abnormality is seen. No gallstones or gallbladder wall thickening. Mild intra- and extrahepatic biliary dilatation. Common bile duct measures 10 mm in diameter. Pancreas: Unchanged mild prominence of the main pancreatic duct. No surrounding inflammatory changes. Spleen: Normal in  size without focal abnormality. Adrenals/Urinary Tract: The adrenal glands are unremarkable. Unchanged 11 mm right renal calculus. No hydronephrosis or renal mass. The bladder is unremarkable. Stomach/Bowel: Stomach is within normal limits. Appendix appears normal. No evidence of bowel wall thickening, distention, or inflammatory changes. Vascular/Lymphatic: Aortic atherosclerosis. No enlarged abdominal or pelvic lymph nodes. Reproductive: Prostate is unremarkable. Other: No abdominal wall hernia or abnormality. No abdominopelvic ascites. No pneumoperitoneum. Musculoskeletal: No acute or significant osseous findings. Review of the MIP images confirms the above findings. IMPRESSION: 1. No evidence of pulmonary embolism. No acute intrathoracic process. 2. Mild intra- and extrahepatic biliary dilatation with common bile duct measuring 10 mm in diameter. Correlation with LFTs is recommended. If there is clinical concern for biliary obstruction, consider further evaluation with MRCP or ERCP. 3. Unchanged 11 mm nonobstructive right nephrolithiasis. 4. Aortic Atherosclerosis (ICD10-I70.0). Electronically Signed   By: Titus Dubin M.D.   On: 09/03/2020 12:03   MR ABDOMEN MRCP WO CONTRAST  Result Date: 09/04/2020 CLINICAL DATA:  CT demonstrating mild biliary duct dilatation. Abdominal pain. Elevated bilirubin. EXAM: MRI ABDOMEN WITHOUT CONTRAST  (INCLUDING MRCP) TECHNIQUE: Multiplanar multisequence MR imaging of the abdomen was performed. Heavily T2-weighted images of the biliary and pancreatic ducts were obtained, and three-dimensional MRCP images were rendered by post processing. COMPARISON:  CT of the abdomen of 1 day prior. FINDINGS: Lower chest: Normal heart size without pericardial or pleural effusion. Hepatobiliary: Normal noncontrast appearance of the liver. No gallstones or acute pericholecystic fluid. The intrahepatic ducts are upper normal, similar. The common duct measures maximally 8 mm in the porta  hepatis on 16/3 versus 9 mm on the prior exam (when remeasured). Tapers in the region of the pancreatic head, without obstructive stone or mass. Pancreas: Borderline prominence of the pancreatic duct is similar over multiple prior exams. No acute inflammation. Spleen:  Normal in size, without focal abnormality. Adrenals/Urinary Tract: Normal adrenal glands. Normal noncontrast appearance of the kidneys for age, without hydronephrosis. Stomach/Bowel: Normal stomach and abdominal bowel loops. Vascular/Lymphatic: Normal aortic caliber. No abdominal adenopathy. Other:  No ascites. Musculoskeletal: S shaped thoracolumbar spine curvature. IMPRESSION: 1. Minimal improvement in common duct caliber, currently upper normal for age. No obstructive stone or mass.  No explanation for elevated bilirubin. 2.  No acute abdominal process. Electronically Signed   By: Abigail Miyamoto M.D.   On: 09/04/2020 15:47   DG Chest Portable 1 View  Result Date: 09/03/2020 CLINICAL DATA:  Shortness of breath, left-sided chest pain EXAM: PORTABLE CHEST 1 VIEW COMPARISON:  08/07/2020 FINDINGS: Cardiac shadow is within normal limits. Postsurgical changes are again seen. Lungs are well aerated bilaterally. No focal infiltrate or effusion is seen. No acute bony abnormality is noted. Postsurgical changes in the cervical spine and proximal left humerus are noted. IMPRESSION: No acute abnormality seen. Electronically Signed   By: Inez Catalina M.D.   On: 09/03/2020 09:56   DG Humerus Left  Result Date: 09/19/2020 CLINICAL DATA:  Pain following fall EXAM: LEFT HUMERUS - 2+ VIEW COMPARISON:  May 03, 2012 FINDINGS: Frontal and lateral views were obtained. There is screw and plate fixation through a previous fracture of the proximal left humerus. There is remodeling with bony overgrowth in this area. No acute fracture or dislocation. No appreciable joint space narrowing or erosion. IMPRESSION: Postoperative fixation for prior fracture proximal left  humerus with bony overgrowth and remodeling in the proximal left humerus. No acute fracture or dislocation. No appreciable joint space narrowing or erosion. Electronically Signed   By: Lowella Grip III M.D.   On: 09/19/2020 13:26   DG Humerus Right  Result Date: 09/17/2020 CLINICAL DATA:  Fall, right shoulder pain EXAM: RIGHT HUMERUS - 2+ VIEW COMPARISON:  None. FINDINGS: Moderate to advanced degenerative changes in the right [facility] joint with joint space narrowing and spurring. Early degenerative changes in the glenohumeral joint. No acute bony abnormality. Specifically, no fracture, subluxation, or dislocation. IMPRESSION: Degenerative changes in the right shoulder. No acute bony abnormality. Electronically Signed   By: Rolm Baptise M.D.   On: 09/17/2020 18:01   ECHOCARDIOGRAM COMPLETE  Result Date: 09/04/2020    ECHOCARDIOGRAM REPORT   Patient Name:   DAELAN GATT Date of Exam: 09/04/2020 Medical Rec #:  [id number]    Height:       72.0 in Accession #:    [id number]   Weight:       175.2 lb Date of Birth:  19-Oct-1945     BSA:          2.014 m Patient Age:    89 years     BP:           121/78 mmHg Patient Gender: M            HR:           69 bpm. Exam Location:  [facility] Procedure: 2D Echo, Color Doppler, Cardiac Doppler and Intracardiac            Opacification Agent Indications:     I25.10 CAD Native Vessel  History:         Patient has prior history of Echocardiogram examinations. ICM,                  Prior CABG; Risk Factors:Former Smoker, Hypertension, Diabetes                  and Dyslipidemia.  Sonographer:     Charmayne Sheer RDCS (AE) Referring Phys:  [id number] CHRISTOPHER END Diagnosing Phys: Nelva Bush MD  Sonographer Comments: Technically difficult study due to poor echo windows. IMPRESSIONS  1. Left ventricular ejection fraction, by estimation, is 70 to 75%. The left ventricle has hyperdynamic function. Left ventricular endocardial border not optimally defined  to evaluate regional wall motion. Left  ventricular diastolic parameters are consistent with Grade I diastolic dysfunction (impaired relaxation).  2. Right ventricular systolic function is normal. The right ventricular size is normal.  3. The mitral valve is grossly normal. Trivial mitral valve regurgitation. No evidence of mitral stenosis.  4. The aortic valve is tricuspid. There is mild calcification of the aortic valve. There is mild thickening of the aortic valve. Aortic valve regurgitation is not visualized. Mild to moderate aortic valve sclerosis/calcification is present, without any evidence of aortic stenosis.  5. The inferior vena cava is normal in size with greater than 50% respiratory variability, suggesting right atrial pressure of 3 mmHg. FINDINGS  Left Ventricle: Left ventricular ejection fraction, by estimation, is 70 to 75%. The left ventricle has hyperdynamic function. Left ventricular endocardial border not optimally defined to evaluate regional wall motion. Definity contrast agent was given IV to delineate the left ventricular endocardial borders. The left ventricular internal cavity size was normal in size. There is no left ventricular hypertrophy. Left ventricular diastolic parameters are consistent with Grade I diastolic dysfunction (impaired relaxation). Right Ventricle: The right ventricular size is normal. No increase in right ventricular wall thickness. Right ventricular systolic function is normal. Left Atrium: Left atrial size was normal in size. Right Atrium: Right atrial size was normal in size. Pericardium: There is no evidence of pericardial effusion. Mitral Valve: The mitral valve is grossly normal. Trivial mitral valve regurgitation. No evidence of mitral valve stenosis. MV peak gradient, 3.7 mmHg. The mean mitral valve gradient is 2.0 mmHg. Tricuspid Valve: The tricuspid valve is not well visualized. Tricuspid valve regurgitation is trivial. Aortic Valve: The aortic valve is tricuspid. There is mild calcification of the  aortic valve. There is mild thickening of the aortic valve. Aortic valve regurgitation is not visualized. Mild to moderate aortic valve sclerosis/calcification is present, without any evidence of aortic stenosis. Aortic valve mean gradient measures 4.0 mmHg. Aortic valve peak gradient measures 8.6 mmHg. Aortic valve area, by VTI measures 2.16 cm. Pulmonic Valve: The pulmonic valve was grossly normal. Pulmonic valve regurgitation is not visualized. No evidence of pulmonic stenosis. Aorta: The aortic root is normal in size and structure. Pulmonary Artery: The pulmonary artery is of normal size. Venous: The inferior vena cava is normal in size with greater than 50% respiratory variability, suggesting right atrial pressure of 3 mmHg. IAS/Shunts: The interatrial septum was not well visualized.  LEFT VENTRICLE PLAX 2D LVIDd:         4.00 cm  Diastology LVIDs:         3.00 cm  LV e' lateral:   7.51 cm/s LV PW:         0.80 cm  LV E/e' lateral: 8.4 LV IVS:        0.80 cm LVOT diam:     2.00 cm LV SV:         52 LV SV Index:   26 LVOT Area:     3.14 cm  RIGHT VENTRICLE RV Basal diam:  3.72 cm LEFT ATRIUM         Index      RIGHT ATRIUM           Index LA diam:    2.60 cm 1.29 cm/m RA Area:     10.75 cm 5.34 cm/m  AORTIC VALVE                   PULMONIC VALVE AV Area (Vmax):  2.22 cm    PV Vmax:       1.37 m/s AV Area (Vmean):   2.17 cm    PV Vmean:      85.800 cm/s AV Area (VTI):     2.16 cm    PV VTI:        0.208 m AV Vmax:           147.00 cm/s PV Peak grad:  7.5 mmHg AV Vmean:          97.000 cm/s PV Mean grad:  3.0 mmHg AV VTI:            0.241 m AV Peak Grad:      8.6 mmHg AV Mean Grad:      4.0 mmHg LVOT Vmax:         104.00 cm/s LVOT Vmean:        67.000 cm/s LVOT VTI:          0.166 m LVOT/AV VTI ratio: 0.69  AORTA Ao Root diam: 3.30 cm MITRAL VALVE MV Area (PHT): 6.96 cm    SHUNTS MV Area VTI:   2.94 cm    Systemic VTI:  0.17 m MV Peak grad:  3.7 mmHg    Systemic Diam: 2.00 cm MV Mean grad:  2.0 mmHg  MV Vmax:       0.96 m/s MV Vmean:      63.9 cm/s MV Decel Time: 109 msec MV E velocity: 63.40 cm/s MV A velocity: 99.00 cm/s MV E/A ratio:  0.64 Harrell Gave End MD Electronically signed by Nelva Bush MD Signature Date/Time: 09/04/2020/5:11:00 PM    Final    CT Angio Abd/Pel w/ and/or w/o  Addendum Date: 09/25/2020   ADDENDUM REPORT: 09/25/2020 08:09 ADDENDUM: No visible lower rib fracture or focal pathology. Electronically Signed   By: Rolm Baptise M.D.   On: 09/25/2020 08:09   Addendum Date: 09/19/2020   ADDENDUM REPORT: 09/19/2020 01:15 ADDENDUM: There is a contusion involving the patient's right flank. There is a small 0.7 cm hyperattenuating area in the posterior right flank (axial series 5, image 38). This was not visualized on the patient's prior study from September 03, 2020. There is an additional smaller hyperattenuating focus more superiorly measuring approximately 6 mm (axial series 5, image 26). Both of these areas are concerning for areas of active extravasation versus is small pseudoaneurysms in the setting of trauma. Exam is limited by lack of a venous phase. These results were called by telephone at the time of interpretation on 09/19/2020 at 1:14 am to provider Jeannine Kitten , who verbally acknowledged these results. Electronically Signed   By: Constance Holster M.D.   On: 09/19/2020 01:15   Result Date: 09/25/2020 CLINICAL DATA:  Upper GI bleed post endoscopy. EXAM: CTA ABDOMEN AND PELVIS WITHOUT AND WITH CONTRAST TECHNIQUE: Multidetector CT imaging of the abdomen and pelvis was performed using the standard protocol during bolus administration of intravenous contrast. Multiplanar reconstructed images and MIPs were obtained and reviewed to evaluate the vascular anatomy. CONTRAST:  110mL OMNIPAQUE IOHEXOL 350 MG/ML SOLN COMPARISON:  September 03, 2020 FINDINGS: VASCULAR Aorta: Atherosclerotic changes are noted without evidence for an aneurysm. Celiac: There is variant celiac artery anatomy. The common  hepatic artery arises directly from the aorta. There is significant narrowing of the origin of the common hepatic artery. The splenic artery arises directly from the aorta. There is no significant narrowing. SMA: There is mild-to-moderate narrowing involving the SMA. Renals: There is mild narrowing of the left  renal artery. There is mild narrowing of the right renal artery. IMA: There is moderate narrowing at the origin of the IMA. Inflow: There is a right common iliac artery stent that is patent. There is mild narrowing of the distal right common iliac artery. Proximal Outflow: Bilateral common femoral and visualized portions of the superficial and profunda femoral arteries are patent without evidence of aneurysm, dissection, vasculitis or significant stenosis. Veins: No obvious venous abnormality within the limitations of this arterial phase study. Review of the MIP images confirms the above findings. NON-VASCULAR Lower chest: There is atelectasis at the lung bases.The heart size is mildly enlarged. There is reflux of contrast into the IVC. Hepatobiliary: The liver is normal. Normal gallbladder.There is no biliary ductal dilation. Pancreas: The pancreatic duct is slightly prominent. Spleen: Unremarkable. Adrenals/Urinary Tract: --Adrenal glands: Unremarkable. --Right kidney/ureter: Again noted is a nonobstructing stone in the right kidney measuring approximately 1.4 cm. There is no hydronephrosis. --Left kidney/ureter: No hydronephrosis or radiopaque kidney stones. --Urinary bladder: Unremarkable. Stomach/Bowel: --Stomach/Duodenum: There is hyperdense material within stomach. --Small bowel: Unremarkable. --Colon: Unremarkable. --Appendix: Normal. Vascular/Lymphatic: Atherosclerotic calcification is present within the non-aneurysmal abdominal aorta, without hemodynamically significant stenosis. There is mild-to-moderate narrowing of the SMA both in the mid and distal sections. There is mild narrowing of the left  renal artery. --No retroperitoneal lymphadenopathy. --No mesenteric lymphadenopathy. --No pelvic or inguinal lymphadenopathy. Reproductive: Unremarkable Other: No ascites or free air. The abdominal wall is normal. Musculoskeletal. There are advanced degenerative changes throughout the lumbar spine. No acute fracture. IMPRESSION: 1. Exam is significantly limited by the lack of a noncontrast phase and a the late venous phase. 2. While there is no definite active arterial bleeding, there is hyperdense material within the stomach which is nonspecific. This could represent extravasated contrast or hyperdense food material. If there is clinical concern for ongoing GI bleeding, a repeat study should be performed with a noncontrast, arterial, and venous phase. 3. Nonobstructing right-sided nephrolithiasis. 4. Chronic findings as detailed above. 5.  Aortic Atherosclerosis (ICD10-I70.0). Electronically Signed: By: Constance Holster M.D. On: 09/19/2020 00:19    Assessment/Plan  1. Gastroparesis -GI was consulted -EGD on 4/7 was unremarkable -Continue Reglan, Protonix and sucralfate  2. Coronary artery disease involving native coronary artery of native heart without angina pectoris -Labile, continue NTG PRN, Plavix, isosorbide mononitrate ER, Lipitor, Xarelto and Zetia  3. DM (diabetes mellitus), type 2 with peripheral vascular complications [facility]) Lab Results  Component Value Date   HGBA1C 6.9 (H) 09/18/2020   -Continue Jardiance and metformin  4. Chronic pain syndrome -changed acetaminophen to PRN -Continue PRN oxycodone x5 days and Neurontin   Family/ staff Communication: Discussed plan of care with resident and charge nurse.  Labs/tests ordered: None  Goals of care:   Short-term care   Durenda Age, DNP, MSN, FNP-BC [facility] [phone] (Monday-Friday 8:00 a.m. - 5:00 p.m.) [phone] (after hours)

## 2020-09-27 ENCOUNTER — Encounter: Payer: Self-pay | Admitting: Internal Medicine

## 2020-09-27 ENCOUNTER — Non-Acute Institutional Stay (SKILLED_NURSING_FACILITY): Payer: Medicare PPO | Admitting: Internal Medicine

## 2020-09-27 DIAGNOSIS — K3184 Gastroparesis: Secondary | ICD-10-CM

## 2020-09-27 DIAGNOSIS — E1143 Type 2 diabetes mellitus with diabetic autonomic (poly)neuropathy: Secondary | ICD-10-CM

## 2020-09-27 DIAGNOSIS — R55 Syncope and collapse: Secondary | ICD-10-CM

## 2020-09-27 DIAGNOSIS — E1151 Type 2 diabetes mellitus with diabetic peripheral angiopathy without gangrene: Secondary | ICD-10-CM | POA: Diagnosis not present

## 2020-09-27 DIAGNOSIS — I10 Essential (primary) hypertension: Secondary | ICD-10-CM

## 2020-09-27 DIAGNOSIS — G894 Chronic pain syndrome: Secondary | ICD-10-CM

## 2020-09-27 NOTE — Assessment & Plan Note (Addendum)
Trial of Duloxetine discussed with him; 30 mg qd initiated with titration as clinically indicated

## 2020-09-27 NOTE — Assessment & Plan Note (Addendum)
GI will be consulted about possible contraindication of Victoza & Metformin It is recommended that he be reevaluated after 1 month to see if Reglan can be weaned or discontinued.

## 2020-09-27 NOTE — Assessment & Plan Note (Signed)
07/16/2020 diabetes was uncontrolled with an A1c of 9.5%.  Current A1c is 6.9% indicating adequate control.

## 2020-09-27 NOTE — Progress Notes (Signed)
NURSING HOME LOCATION:  [place] NUMBER:  [place]:  Full  PCP:  [facility] Dr. Gwynneth Aliment, [place], [facility]  This is a comprehensive admission note to this SNFperformed on this date less than 30 days from date of admission. Included are preadmission medical/surgical history; reconciled medication list; family history; social history and comprehensive review of systems.  Corrections and additions to the records were documented. Comprehensive physical exam was also performed. Additionally artery disease, and [place] clinical summary was entered for each active diagnosis pertinent to this admission in the Problem List to enhance continuity of care.  HPI: He was hospitalized 4/4 - 09/25/2020 presenting with persistant nausea & vomiting and abrasions & contusions in the context of a syncopal episode which occurred during micturition.  Despite the nausea and vomiting electrolytes were only minimally abnormal with a sodium of 131 and potassium of 3.  White count was 15,900. He received 1 L normal saline, IV Zofran, and Percocet in the ED.  CT of the head was markable with no acute change. The syncope is in the context of a history of recurrent falls with @ least 3 falls in the past 2 months.  Orthostatics were positive in the ED. He was monitored on telemetry and no significant dysrhythmias were noted. Symptoms improved with rehydration; but he continued to have nausea during the hospitalization.  Lipase was normal.   Prior records were reviewed; MRCP in January 2022 was of concern for pancreatic cancer.  ERCP was recommended but the patient was on Plavix at the time for CAD.  CT with contrast 3/21 revealed mild dilation of the common bile duct and stable 11 mm nonobstructive right nephrolithiasis.  MRCP was completed 3/23 and was unremarkable.  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  During this hospitalization, Plavix and Xarelto were held and GI performed an EGD which was unremarkable.  Plavix and Xarelto restarted the day after the EGD.  Reglan 10 mg 3 times daily AC, sucralfate 1 g 4 times daily, and Protonix 40 mg qd were added to the Zofran.  It was recommended that Reglan be reevaluated after 1 month to see if it could be discontinued.  He has a chronic pain syndrome attributed to spinal stenosis.  It is felt that the soft tissue injuries with ecchymoses and swelling sustained in the syncopal episode exacerbated the pain.  He had been on gabapentin 100 mg 3 times daily daily at home.  Tylenol 650 every 6 hours and oxycodone IR 10 mg every 6 hours were initiated.  The oxycodone was weaned to 2.5 mg every 6 hours on the day of discharge.  Progressive weaning was discussed with the patient prior to discharge.  Past medical and surgical history: Includes history of ischemic cardiomyopathy, essential hypertension, dyslipidemia, GERD, chronic pain syndrome, carotid artery disease, and diabetes with vascular complications. Surgeries and procedures include CABG as well as stenting.  Social history: He currently does not drink alcohol.  He has a 30-pack-year history of smoking. There is a PMH of cannabis intoxication.  Today he denies ever using marijuana.  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  Family history: Reviewed   Review of systems: His major complaint is pain at the site of ecchymosis and bruising over the posterior inferior thorax and flank. He states that he is lost from 258 down to 150+ in the last 2 months because  of the chronic nausea and vomiting.  He stated that he had to come back from lunch because of nausea. He states that he checks fasting sugars at home but cannot remember the numbers.  He has numbness and tingling which is chronic related to diabetes. He does validate postural hypotension symptoms.  He stated that he had  compression hose while hospitalized. He describes chronic headache. Constitutional: No fever, significant weight change, fatigue  Eyes: No redness, discharge, pain, vision change ENT/mouth: No nasal congestion, purulent discharge, earache, change in hearing, sore throat  Cardiovascular: No chest pain, palpitations, paroxysmal nocturnal dyspnea, claudication, edema  Respiratory: No cough, sputum production, hemoptysis, DOE, significant snoring, apnea Gastrointestinal: No heartburn, dysphagia, abdominal pain, nausea /vomiting, rectal bleeding, melena, change in bowels Genitourinary: No dysuria, hematuria, pyuria, incontinence, nocturia Musculoskeletal: No joint stiffness, joint swelling, weakness, pain Dermatologic: No rash, pruritus, change in appearance of skin Neurologic: No dizziness, headache, syncope, seizures, numbness, tingling Psychiatric: No significant anxiety, depression, insomnia, anorexia Endocrine: No change in hair/skin/nails, excessive thirst, excessive hunger, excessive urination  Hematologic/lymphatic: No significant bruising, lymphadenopathy, abnormal bleeding Allergy/immunology: No itchy/watery eyes, significant sneezing, urticaria, angioedema  Physical exam:  Pertinent or positive findings: When I entered the room he was asleep.  His respirations were not audible; there was no definite apnea.  He appears chronically ill.  He has pattern alopecia.  He has a beard and mustache which is somewhat unkempt.  He is totally edentulous.  Breath sounds are decreased.  He has extensive bruising over the forearms and dorsum of the hands.  There is a patch of the left forearm.  He exhibited involuntary myocardial jerking of the right foot intermittently while asleep.  General appearance: Adequately nourished; no acute distress, increased work of breathing is present.   Lymphatic: No lymphadenopathy about the head, neck, axilla. Eyes: No conjunctival inflammation or lid edema is present.  There is no scleral icterus. Ears:  External ear exam shows no significant lesions or deformities.   Nose:  External nasal examination shows no deformity or inflammation. Nasal mucosa are pink and moist without lesions, exudates Oral exam: Lips and gums are healthy appearing.There is no oropharyngeal erythema or exudate. Neck:  No thyromegaly, masses, tenderness noted.    Heart:  Normal rate and regular rhythm. S1 and S2 normal without gallop, murmur, click, rub.  Lungs: Chest clear to auscultation without wheezes, rhonchi, rales, rubs. Abdomen: Bowel sounds are normal.  Abdomen is soft and nontender with no organomegaly, hernias, masses. GU: Deferred  Extremities:  No cyanosis, clubbing, edema. Neurologic exam:  Strength equal  in upper & lower extremities. Balance, [place], finger to nose testing could not be completed due to clinical state Deep tendon reflexes are equal Skin: Warm & dry w/o tenting. No significant lesions or rash.  See clinical summary under each active problem in the Problem List with associated updated therapeutic plan

## 2020-09-28 NOTE — Patient Instructions (Signed)
See assessment and plan under each diagnosis in the problem list and acutely for this visit 

## 2020-09-28 NOTE — Assessment & Plan Note (Addendum)
Systolic up today;BP average will be verified & antihypertensive regimen adjusted appropriately

## 2020-09-28 NOTE — Assessment & Plan Note (Signed)
Isometric exercises discussed . To perform such 4- 5 times prior to standing if having been supine or seated for a period of time.

## 2020-09-30 ENCOUNTER — Emergency Department (HOSPITAL_COMMUNITY): Payer: No Typology Code available for payment source

## 2020-09-30 ENCOUNTER — Inpatient Hospital Stay (HOSPITAL_COMMUNITY)
Admission: EM | Admit: 2020-09-30 | Discharge: 2020-10-04 | DRG: 313 | Disposition: A | Discharge status: Skilled Nursing Facility | Payer: No Typology Code available for payment source | Source: Skilled Nursing Facility | Attending: Family Medicine | Admitting: Family Medicine

## 2020-09-30 ENCOUNTER — Encounter (HOSPITAL_COMMUNITY): Payer: Self-pay

## 2020-09-30 ENCOUNTER — Other Ambulatory Visit: Payer: Self-pay

## 2020-09-30 DIAGNOSIS — Z8249 Family history of ischemic heart disease and other diseases of the circulatory system: Secondary | ICD-10-CM

## 2020-09-30 DIAGNOSIS — Z823 Family history of stroke: Secondary | ICD-10-CM

## 2020-09-30 DIAGNOSIS — Z888 Allergy status to other drugs, medicaments and biological substances status: Secondary | ICD-10-CM

## 2020-09-30 DIAGNOSIS — Z886 Allergy status to analgesic agent status: Secondary | ICD-10-CM

## 2020-09-30 DIAGNOSIS — E1143 Type 2 diabetes mellitus with diabetic autonomic (poly)neuropathy: Secondary | ICD-10-CM | POA: Diagnosis present

## 2020-09-30 DIAGNOSIS — I252 Old myocardial infarction: Secondary | ICD-10-CM

## 2020-09-30 DIAGNOSIS — I10 Essential (primary) hypertension: Secondary | ICD-10-CM | POA: Diagnosis present

## 2020-09-30 DIAGNOSIS — E785 Hyperlipidemia, unspecified: Secondary | ICD-10-CM | POA: Diagnosis present

## 2020-09-30 DIAGNOSIS — I480 Paroxysmal atrial fibrillation: Secondary | ICD-10-CM | POA: Diagnosis present

## 2020-09-30 DIAGNOSIS — G894 Chronic pain syndrome: Secondary | ICD-10-CM | POA: Diagnosis present

## 2020-09-30 DIAGNOSIS — M48061 Spinal stenosis, lumbar region without neurogenic claudication: Secondary | ICD-10-CM | POA: Diagnosis present

## 2020-09-30 DIAGNOSIS — Z87891 Personal history of nicotine dependence: Secondary | ICD-10-CM

## 2020-09-30 DIAGNOSIS — I440 Atrioventricular block, first degree: Secondary | ICD-10-CM | POA: Diagnosis present

## 2020-09-30 DIAGNOSIS — R079 Chest pain, unspecified: Secondary | ICD-10-CM | POA: Diagnosis present

## 2020-09-30 DIAGNOSIS — Z8551 Personal history of malignant neoplasm of bladder: Secondary | ICD-10-CM

## 2020-09-30 DIAGNOSIS — E1165 Type 2 diabetes mellitus with hyperglycemia: Secondary | ICD-10-CM | POA: Diagnosis present

## 2020-09-30 DIAGNOSIS — I959 Hypotension, unspecified: Secondary | ICD-10-CM | POA: Diagnosis not present

## 2020-09-30 DIAGNOSIS — Z20822 Contact with and (suspected) exposure to covid-19: Secondary | ICD-10-CM | POA: Diagnosis present

## 2020-09-30 DIAGNOSIS — Z7902 Long term (current) use of antithrombotics/antiplatelets: Secondary | ICD-10-CM

## 2020-09-30 DIAGNOSIS — Z951 Presence of aortocoronary bypass graft: Secondary | ICD-10-CM

## 2020-09-30 DIAGNOSIS — Z7984 Long term (current) use of oral hypoglycemic drugs: Secondary | ICD-10-CM

## 2020-09-30 DIAGNOSIS — N179 Acute kidney failure, unspecified: Secondary | ICD-10-CM | POA: Diagnosis not present

## 2020-09-30 DIAGNOSIS — R296 Repeated falls: Secondary | ICD-10-CM | POA: Diagnosis present

## 2020-09-30 DIAGNOSIS — Z955 Presence of coronary angioplasty implant and graft: Secondary | ICD-10-CM

## 2020-09-30 DIAGNOSIS — M5136 Other intervertebral disc degeneration, lumbar region: Secondary | ICD-10-CM | POA: Diagnosis present

## 2020-09-30 DIAGNOSIS — E871 Hypo-osmolality and hyponatremia: Secondary | ICD-10-CM | POA: Diagnosis present

## 2020-09-30 DIAGNOSIS — I2510 Atherosclerotic heart disease of native coronary artery without angina pectoris: Secondary | ICD-10-CM | POA: Diagnosis present

## 2020-09-30 DIAGNOSIS — Z79899 Other long term (current) drug therapy: Secondary | ICD-10-CM

## 2020-09-30 DIAGNOSIS — K219 Gastro-esophageal reflux disease without esophagitis: Secondary | ICD-10-CM | POA: Diagnosis present

## 2020-09-30 DIAGNOSIS — E1151 Type 2 diabetes mellitus with diabetic peripheral angiopathy without gangrene: Secondary | ICD-10-CM | POA: Diagnosis present

## 2020-09-30 DIAGNOSIS — R0789 Other chest pain: Principal | ICD-10-CM | POA: Diagnosis present

## 2020-09-30 DIAGNOSIS — K5900 Constipation, unspecified: Secondary | ICD-10-CM | POA: Diagnosis present

## 2020-09-30 DIAGNOSIS — Z7901 Long term (current) use of anticoagulants: Secondary | ICD-10-CM

## 2020-09-30 DIAGNOSIS — K3184 Gastroparesis: Secondary | ICD-10-CM | POA: Diagnosis present

## 2020-09-30 DIAGNOSIS — E1169 Type 2 diabetes mellitus with other specified complication: Secondary | ICD-10-CM | POA: Diagnosis present

## 2020-09-30 LAB — BASIC METABOLIC PANEL WITH GFR
Anion gap: 11 (ref 5–15)
BUN: 12 mg/dL (ref 8–23)
CO2: 21 mmol/L — ABNORMAL LOW (ref 22–32)
Calcium: 9.3 mg/dL (ref 8.9–10.3)
Chloride: 101 mmol/L (ref 98–111)
Creatinine, Ser: 1.04 mg/dL (ref 0.61–1.24)
GFR, Estimated: >60 mL/min (ref >60)
Glucose, Bld: 153 mg/dL — ABNORMAL HIGH (ref 70–99)
Potassium: 3.6 mmol/L (ref 3.5–5.1)
Sodium: 133 mmol/L — ABNORMAL LOW (ref 135–145)

## 2020-09-30 LAB — CBC
HCT: 43.1 % (ref 39.0–52.0)
Hemoglobin: 15 g/dL (ref 13.0–17.0)
MCH: 32.8 pg (ref 26.0–34.0)
MCHC: 34.8 g/dL (ref 30.0–36.0)
MCV: 94.3 fL (ref 80.0–100.0)
Platelets: 254 10*3/uL (ref 150–400)
RBC: 4.57 MIL/uL (ref 4.22–5.81)
RDW: 13.5 % (ref 11.5–15.5)
WBC: 9.6 10*3/uL (ref 4.0–10.5)
nRBC: 0 % (ref 0.0–0.2)

## 2020-09-30 LAB — GLUCOSE, CAPILLARY
Glucose-Capillary: 154 mg/dL — ABNORMAL HIGH (ref 70–99)
Glucose-Capillary: 140 mg/dL — ABNORMAL HIGH (ref 70–99)

## 2020-09-30 LAB — TROPONIN I (HIGH SENSITIVITY)
Troponin I (High Sensitivity): 12 ng/L (ref <18)
Troponin I (High Sensitivity): 10 ng/L (ref <18)

## 2020-09-30 IMAGING — DX DG CHEST 1V PORT
1 series · 1 of 1 positions shown · non-contrast
Comparison: Chest x-ray dated [DATE].

CLINICAL DATA: Chest pain and shortness of breath.

EXAM:
PORTABLE CHEST 1 VIEW

[chest ap]
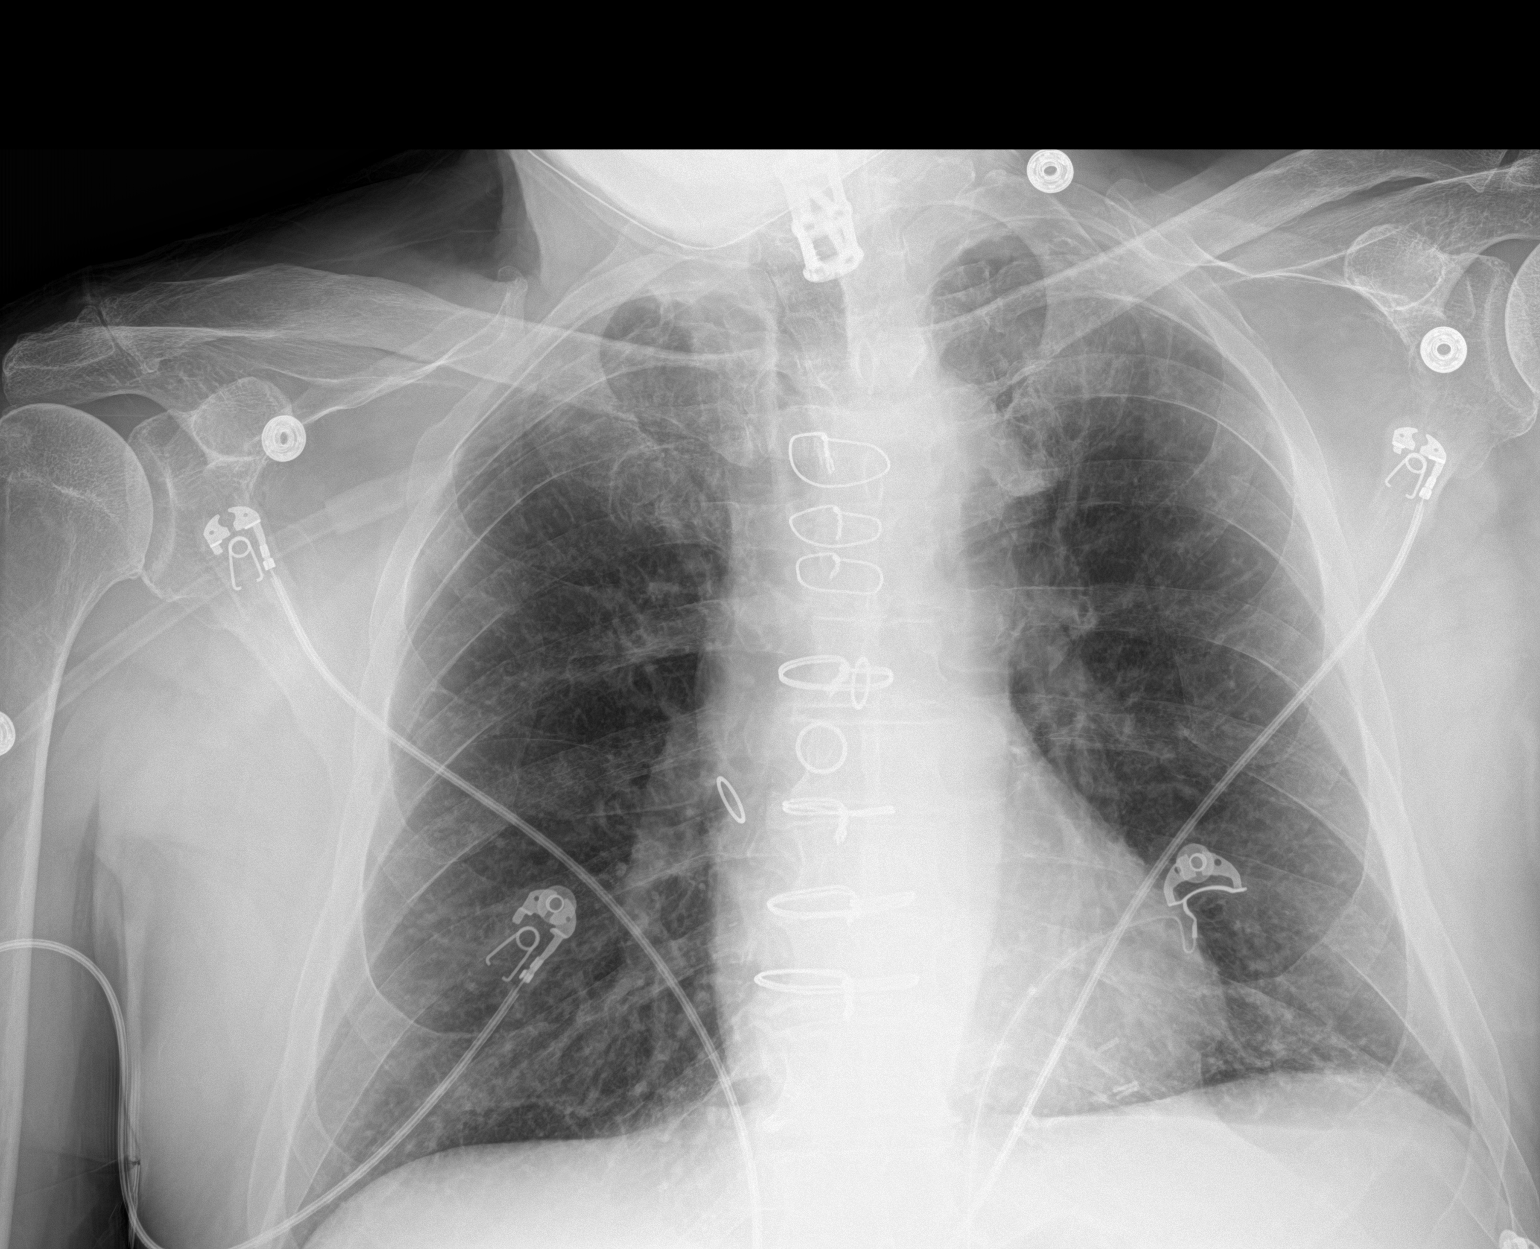

[1 of 1 positions shown; findings below may reference images not displayed]

FINDINGS: Heart size and mediastinal contours are stable. Median sternotomy
wires appear intact and stable in alignment. Lungs are clear. No
pleural effusion or pneumothorax is seen.
IMPRESSION: No active disease. No evidence of pneumonia or pulmonary edema.

## 2020-09-30 MED ORDER — INSULIN ASPART 100 UNIT/ML ~~LOC~~ SOLN
0.0000 [IU] | Freq: Three times a day (TID) | SUBCUTANEOUS | Status: DC
  Administered 2020-09-30: 2 [IU] via SUBCUTANEOUS
  Administered 2020-10-01: 1 [IU] via SUBCUTANEOUS

## 2020-09-30 MED ORDER — CHLORTHALIDONE 25 MG PO TABS
25.0000 mg | ORAL_TABLET | Freq: Every day | ORAL | Status: DC
  Administered 2020-10-01 – 2020-10-02 (×2): 25 mg via ORAL
  Filled 2020-09-30 (×3): qty 1

## 2020-09-30 MED ORDER — CARVEDILOL 3.125 MG PO TABS
3.1250 mg | ORAL_TABLET | Freq: Two times a day (BID) | ORAL | Status: DC
  Administered 2020-09-30 – 2020-10-01 (×2): 3.125 mg via ORAL
  Filled 2020-09-30 (×2): qty 1

## 2020-09-30 MED ORDER — EZETIMIBE 10 MG PO TABS
10.0000 mg | ORAL_TABLET | Freq: Every day | ORAL | Status: DC
  Administered 2020-10-01 – 2020-10-04 (×4): 10 mg via ORAL
  Filled 2020-09-30 (×4): qty 1

## 2020-09-30 MED ORDER — MORPHINE SULFATE (PF) 2 MG/ML IV SOLN: 2.0000 mg | INTRAVENOUS | Status: DC | PRN

## 2020-09-30 MED ORDER — SUCRALFATE 1 G PO TABS
1.0000 g | ORAL_TABLET | Freq: Three times a day (TID) | ORAL | Status: DC
  Administered 2020-09-30 – 2020-10-04 (×16): 1 g via ORAL
  Filled 2020-09-30 (×16): qty 1

## 2020-09-30 MED ORDER — SPIRONOLACTONE 25 MG PO TABS
25.0000 mg | ORAL_TABLET | Freq: Every day | ORAL | Status: DC
  Administered 2020-10-01 – 2020-10-04 (×4): 25 mg via ORAL
  Filled 2020-09-30 (×4): qty 1

## 2020-09-30 MED ORDER — FENTANYL CITRATE (PF) 100 MCG/2ML IJ SOLN
50.0000 ug | Freq: Once | INTRAMUSCULAR | Status: AC
  Administered 2020-09-30: 50 ug via INTRAVENOUS
  Filled 2020-09-30: qty 2

## 2020-09-30 MED ORDER — ACETAMINOPHEN 325 MG PO TABS
650.0000 mg | ORAL_TABLET | ORAL | Status: DC
  Administered 2020-09-30 – 2020-10-04 (×17): 650 mg via ORAL
  Filled 2020-09-30 (×20): qty 2

## 2020-09-30 MED ORDER — MORPHINE SULFATE (PF) 2 MG/ML IV SOLN
2.0000 mg | INTRAVENOUS | Status: DC | PRN
  Administered 2020-09-30 – 2020-10-02 (×9): 2 mg via INTRAVENOUS
  Filled 2020-09-30 (×10): qty 1

## 2020-09-30 MED ORDER — NITROGLYCERIN 0.4 MG SL SUBL: 0.4000 mg | SUBLINGUAL_TABLET | SUBLINGUAL | Status: DC | PRN

## 2020-09-30 MED ORDER — DULOXETINE HCL 30 MG PO CPEP
30.0000 mg | ORAL_CAPSULE | Freq: Every day | ORAL | Status: DC
  Administered 2020-10-01 – 2020-10-04 (×4): 30 mg via ORAL
  Filled 2020-09-30 (×4): qty 1

## 2020-09-30 MED ORDER — RIVAROXABAN 20 MG PO TABS
20.0000 mg | ORAL_TABLET | Freq: Every day | ORAL | Status: DC
  Administered 2020-10-01 – 2020-10-02 (×2): 20 mg via ORAL
  Filled 2020-09-30 (×2): qty 1

## 2020-09-30 MED ORDER — GABAPENTIN 100 MG PO CAPS
100.0000 mg | ORAL_CAPSULE | Freq: Three times a day (TID) | ORAL | Status: DC
  Administered 2020-09-30 – 2020-10-04 (×12): 100 mg via ORAL
  Filled 2020-09-30 (×12): qty 1

## 2020-09-30 MED ORDER — ATORVASTATIN CALCIUM 80 MG PO TABS
80.0000 mg | ORAL_TABLET | Freq: Every day | ORAL | Status: DC
  Administered 2020-09-30 – 2020-10-04 (×5): 80 mg via ORAL
  Filled 2020-09-30 (×5): qty 1

## 2020-09-30 MED ORDER — POLYETHYLENE GLYCOL 3350 17 G PO PACK
17.0000 g | PACK | Freq: Every day | ORAL | Status: DC
  Administered 2020-10-01 – 2020-10-02 (×2): 17 g via ORAL
  Filled 2020-09-30 (×4): qty 1

## 2020-09-30 MED ORDER — PANTOPRAZOLE SODIUM 40 MG PO TBEC
40.0000 mg | DELAYED_RELEASE_TABLET | Freq: Every day | ORAL | Status: DC
  Administered 2020-10-01 – 2020-10-04 (×4): 40 mg via ORAL
  Filled 2020-09-30 (×4): qty 1

## 2020-09-30 MED ORDER — CLOPIDOGREL BISULFATE 75 MG PO TABS
75.0000 mg | ORAL_TABLET | Freq: Every morning | ORAL | Status: DC
  Administered 2020-10-01 – 2020-10-04 (×4): 75 mg via ORAL
  Filled 2020-09-30 (×4): qty 1

## 2020-09-30 MED ORDER — NITROGLYCERIN IN D5W 200-5 MCG/ML-% IV SOLN
0.0000 ug/min | INTRAVENOUS | Status: DC
  Administered 2020-09-30: 5 ug/min via INTRAVENOUS
  Filled 2020-09-30: qty 250

## 2020-09-30 MED ORDER — MELATONIN 3 MG PO TABS
3.0000 mg | ORAL_TABLET | Freq: Every evening | ORAL | Status: DC | PRN
  Administered 2020-09-30: 3 mg via ORAL
  Filled 2020-09-30: qty 1

## 2020-09-30 MED ORDER — FENTANYL CITRATE (PF) 100 MCG/2ML IJ SOLN
50.0000 ug | INTRAMUSCULAR | Status: DC | PRN
  Administered 2020-09-30: 50 ug via INTRAVENOUS
  Filled 2020-09-30: qty 2

## 2020-09-30 NOTE — H&P (Addendum)
[facility] Admission History and Physical Service Pager: [phone]  Patient name: Bryan Scott Medical record number: [id number] Date of birth: [birth year] Age: 75 y.o. Gender: male  Primary Care Provider: [facility] [place] Consultants: None  Code Status: Full Code  Preferred Emergency Contact: Hinda Kehr, [phone] cell  Home [phone]  Chief Complaint: chest pain   Assessment and Plan: CASIN FEDERICI is a 75 y.o. male presenting with left sided chest pain. PMH is significant for atrial fibrillation, GERD, CAD s/p CABG and coronary stent placement, hypertension, hyperlipidemia, type 2 DM and spinal stenosis.   Left-sided chest pain  ACS r/o Presents to the ED with new onset left-sided chest pain that radiates down the left arm and jaw. Also endorsing worsening dyspnea and diaphoresis along with emesis.CXR unremarkable, no active disease without evidence of pneumonia or pulmonary edema. EKG on admission notable for possible ST elevation in inferior lead with otherwise normal sinus rhythm. Initial troponin 12. Cardiology consulted by the ED provider who noted no further intervention at this time given normal troponin and overall not concerning EKG; recommendation for consult to cardiology if troponin becomes elevated. In the ED, patient placed on nitro drip and given fentanyl for pain. Obvious concern for ACS rule out initially although troponin within normal limits. Will  admit for observation given extensive cardiac history including CABG due to CAD and AF. Differential includes unstable angina as there does not seem to be an exertional component to trigger chest pain and patient reports improvement with Nitro.Also on the differential is possible PE although low concern given isolated symptom of dyspnea likely due to other cardiac etiology and no tachycardia. No indication of volume overload and given lack of history, less likely heart failure. Less  likely costochondritis given exam without reproducible pain with palpation. Also differential includes GERD given patient's longstanding history of this which he takes protonix for along with extensive gastric history.  -Admit to FPTS, medical telemetry, attending Dr. Gwendlyn Deutscher  -cardiology aware of case, will plan to re-consult if troponin levels increase -trend trops -am EKG -f/u am CMP, CBC, Mg -fentanyl 50 mcg q2h prn for moderate to severe pain -consider CTA chest if respiratory status worsens or develops abnormal VS -up with assistance  -PT/OT eval and treat - VS per floor protocol   Acute hypoxic respiratory failure Does not use any oxygen at baseline. Currently on 2.5L O2. -wean O2 as tolerated -monitor pulse ox  History of recurrent falls  Patient has history of recurrent falls that he endorsed multiple falls. Today he denies any recent falls since last admission.  -up with assistance -PT/OT eval and treat  Hyponatremia  Na 133. Possibly may be patient's baseline as it is likely a chronic issue per chart review and remained the case during last admission. -am BMP  Hypertension Initially hypertensive on admission, BP 150/70 but currently normotensive at 138/68. Home medications include coreg 3.125 mg bid daily, chlorthalidone 25 mg daily and imdur 30 mg daily. -continue home coreg -continue home chlorthalidone  -hold home imdur given on nitro gtt -monitor BP   Type 2 DM Hyperglycemic on admission of blood glucose of 153. Most recent A1c 6.9. Home medications include jardiance 25 mg daily and metformin 500 mg bid daily.  -hold home metformin -hold home jardiance -sSSI -monitor CBG  Hyperlipidemia Lipid panel from 08/08/2020 unremarkable. Home meds include atorvastatin 80 mg daily and zetia 10 mg daily. -continue home atorvastatin  -continue home zetia  Gastroparesis  Possible  intra-abdominal process During last hospitalization patient endorsed nausea and vomiting.  Previous MRCP 3/23 unremarkable. During last admission, GI consulted and performed EGD which was also unremarkable. Discharged on protonix 40 mg, sucralfate 1 g qid and reglan 10 mg tid but patient only notes to be taking protonix and carafate as previously prescribed during last hospitalization. Currently not endorsing any nausea and vomiting.  -continue home protonix -continue carafate  Paroxysmal atrial fibrillation EKG notable for normal sinus rhythm. Currently rate controlled at HR 70.  Home medications include xarelto and coreg. 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 Echo in 08/2020 notable for EF 70-75% along with Grade 1 diastolic dysfunction. Home meds include plavix 75 mg.  -continue home plavix   Chronic pain syndrome  Spinal stenosis  Endorses chronic back pain during this admission that is not new pain. Previous lumbar x-ray demonstrated diffuse degenerative disc and facet disease without acute bony abnormality. Home medications include oxycodone 5 mg. -hold home oxycodone  GERD Home medications include protonix 40 mg daily. -continue home protonix   Constipation -miralax to avoid opioid-induced constipation   FEN/GI: heart healthy carb modified  Prophylaxis: xarelto (full dose anticoagulation)   Disposition: admit to med telemetry, attending Dr. Gwendlyn Deutscher   History of Present Illness:  Bryan Scott is a 75 y.o. male presenting with left sided chest pain.   Patient reports that earlier today he [facility] when he suddenly had left sided chest pressure and intense pain associated with nausea/vomiting & sweating. He reports some associated SOB. He reports having new blurry vision. Patient states that he was in a wheelchair and did not fall when the pain started. He has not had any recent injuries since last discharge. He reports that he had  similar pain when he had stent placed a few months ago. He reports feeling weak since the onset of pain. He reports pain was initially 10/10 but is now improved to 5/10. He does not require oxygen at baseline but currently on 2.5L. Denies any change to activity level and chest pain not associated with exertion.   ED Course:  Patient arrived with VS notable for HTN 150/70 and saturating 95% on RA as well as increased respiratory rate ranging from 21 to max of 29. He was treated with fentanyl for pain and started on nitro gtt. Patient's case was discussed with cardiology and recommended to follow troponin level. Initial trop of 12.   Review Of Systems: Per HPI with the following additions:   Review of Systems  Constitutional: Positive for diaphoresis. Negative for fever.  Eyes: Positive for visual disturbance.  Respiratory: Positive for shortness of breath.   Cardiovascular: Positive for chest pain. Negative for leg swelling.  Gastrointestinal: Negative for abdominal pain.  Skin:       bruising from prior fall   Neurological: Positive for weakness. Negative for dizziness and headaches.  Psychiatric/Behavioral: Negative for confusion.     Patient Active Problem List   Diagnosis Date Noted  . 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 Non-intractable vomiting   . Dehydration   . Fall   . Injury   . Malnutrition of moderate degree 09/19/2020  . Protein-calorie malnutrition ([place]) 09/18/2020  . Syncope 09/17/2020  . History of pancreatitis   . Cervical radiculopathy   . Acute pancreatitis 08/07/2020  . Hyperlipidemia associated with type 2 diabetes mellitus ([place]) 08/07/2020  .  NSTEMI (non-ST elevated myocardial infarction) ([place]) 07/11/2020  . AKI (acute kidney injury) ([place]) 07/11/2020  . Acute metabolic encephalopathy 93/79/0240  . GERD (gastroesophageal reflux disease) 09/09/2019  . CAD (coronary artery disease) 09/09/2019  . Chronic pain syndrome 09/09/2019  . Leukocytosis  09/09/2019  . Shortness of breath   . Hyponatremia 09/26/2016  . Chest pain 09/22/2016  . 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  . Abnormal EKG 09/21/2016  . Chronic pain 09/21/2016  . PAF (paroxysmal atrial fibrillation) ([place]) 09/21/2016  . Essential hypertension 05/02/2016  . Carotid stenosis 05/02/2016  . CAD in native artery   . Atypical chest pain   . DM (diabetes mellitus), type 2 with peripheral vascular complications ([place])   . Hyperlipidemia   . Pain of left upper extremity   . Angina pectoris ([place])   . Abdominal pain   . Chest pain, central 03/13/2015  . Hypertensive urgency 03/13/2015  . Spinal stenosis 01/10/2012    Past Medical History: Past Medical History:  Diagnosis Date  . 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 Back injuries   . Bladder cancer ([place])   . Cancer ([place])   . Carotid artery disease ([facility])    > 75% bilateral ICA stenoses on 01/2013 CT  . Chronic pain syndrome   . Coronary artery disease    s/p CABG ~ 2007  . 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 GERD (gastroesophageal reflux disease)   . Heparin allergy    Bleeding  . History of tobacco use   . Hx of CABG   . Hx of cardiac cath   . Hyperlipidemia   . Hypertension   . Ischemic cardiomyopathy   . Paroxysmal atrial fibrillation (H[facility])    On Xarelto anticoagulation  . Type 2 diabetes mellitus ([facility])    A1C 6.8% in 01/2013    Past Surgical History: Past Surgical History:  Procedure Laterality Date  . CARDIAC CATHETERIZATION    . CORONARY ARTERY BYPASS GRAFT  2007  . CORONARY STENT INTERVENTION N/A 07/13/2020   Procedure: CORONARY STENT INTERVENTION;  Surgeon: Nelva Bush, MD;  Location: [facility];  Service: Cardiovascular;  Laterality: N/A;  . ESOPHAGOGASTRODUODENOSCOPY (EGD) WITH PROPOFOL N/A 09/20/2020   Procedure: ESOPHAGOGASTRODUODENOSCOPY (EGD) WITH PROPOFOL;  Surgeon: Ladene Artist, MD;  [facility];  Service: Endoscopy;   Laterality: N/A;  . LEFT HEART CATH AND CORS/GRAFTS ANGIOGRAPHY N/A 07/13/2020   Procedure: LEFT HEART CATH AND CORS/GRAFTS ANGIOGRAPHY;  Surgeon: Minna Merritts, MD;  Location: [facility];  Service: Cardiovascular;  Laterality: N/A;    Social History: Social History   Tobacco Use  . Smoking status: Former Smoker    Packs/day: 1.00    Years: 30.00    Pack years: 30.00    Types: Cigarettes  . Smokeless tobacco: Never Used  . Tobacco comment: 30 pack-year history. Quit 8-9 years ago.   Vaping Use  . Vaping Use: Never used  Substance Use Topics  . Alcohol use: Not Currently  . Drug use: Never   Additional social history:  Please also refer to relevant sections of EMR.  Family History: Family History  Problem Relation Age of Onset  . Heart disease Mother 83       Deceased  . CVA Mother   . Seizures Father 9       Deceased from complications with seizures     Allergies and Medications: Allergies  Allergen Reactions  . 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 Aspirin  Swelling  . Lisinopril Anaphylaxis  . Angiotensin Receptor Blockers     Never taken but PMH of angioedema with ACE-I ( Lisinopril)  . Heparin Other (See Comments)    Reaction:  Bleeding    No current facility-administered medications on file prior to encounter.   Current Outpatient Medications on File Prior to Encounter  Medication Sig Dispense Refill  . acetaminophen (TYLENOL) 325 MG tablet Take 2 tablets (650 mg total) by mouth every 6 (six) hours.    Marland Kitchen atorvastatin (LIPITOR) 80 MG tablet Take 80 mg by mouth daily.    . carvedilol (COREG) 3.125 MG tablet Take 1 tablet (3.125 mg total) by mouth 2 (two) times daily with a meal. 60 tablet 1  . chlorthalidone (HYGROTON) 25 MG tablet Take 1 tablet by mouth daily.    . clopidogrel (PLAVIX) 75 MG tablet Take 75 mg by mouth every morning.    . DULoxetine (CYMBALTA) 30 MG capsule Take 30 mg by mouth daily.    . empagliflozin (JARDIANCE) 25 MG TABS tablet  Take 25 mg by mouth daily.    Marland Kitchen ezetimibe (ZETIA) 10 MG tablet Take 1 tablet (10 mg total) by mouth daily. 30 tablet 0  . isosorbide mononitrate (IMDUR) 30 MG 24 hr tablet Take 1 tablet (30 mg total) by mouth daily. 30 tablet 0  . melatonin 5 MG TABS Take 5 mg by mouth at bedtime.    . metFORMIN (GLUCOPHAGE) 500 MG tablet Take 500 mg by mouth 2 (two) times daily with a meal.    . Multiple Vitamin (MULTIVITAMIN WITH MINERALS) TABS tablet Take 1 tablet by mouth daily. 30 tablet 0  . nitroGLYCERIN (NITROSTAT) 0.4 MG SL tablet Place 1 tablet (0.4 mg total) under the tongue every 5 (five) minutes as needed for chest pain. 30 tablet 1  . oxyCODONE (OXY IR/ROXICODONE) 5 MG immediate release tablet Take 0.5 tablets (2.5 mg total) by mouth every 6 (six) hours as needed for up to 5 days for severe pain. 10 tablet 0  . pantoprazole (PROTONIX) 40 MG tablet Take 1 tablet (40 mg total) by mouth daily. 30 tablet 0  . polyethylene glycol (MIRALAX / GLYCOLAX) 17 g packet Take 17 g by mouth daily. 14 each 0  . rivaroxaban (XARELTO) 20 MG TABS tablet Take 1 tablet (20 mg total) by mouth daily with supper. 30 tablet 0  . senna-docusate (SENOKOT-S) 8.6-50 MG tablet Take 2 tablets by mouth 2 (two) times daily. 100 tablet 0  . sucralfate (CARAFATE) 1 g tablet Take 1 tablet (1 g total) by mouth 4 (four) times daily -  with meals and at bedtime. 100 tablet 0  . Tetrahydrozoline HCl (VISINE OP) Place 1 drop into both eyes daily as needed (dry red eyes).    Marland Kitchen amLODipine (NORVASC) 10 MG tablet Take 1 tablet by mouth daily. (Patient not taking: No sig reported)    . gabapentin (NEURONTIN) 100 MG capsule Take 100 mg by mouth 3 (three) times daily. (Patient not taking: No sig reported)    . glipiZIDE (GLUCOTROL) 10 MG tablet Take 10 mg by mouth 2 (two) times daily before a meal. (Patient not taking: No sig reported)    . guaiFENesin-dextromethorphan (ROBITUSSIN DM) 100-10 MG/5ML syrup Take 5 mLs by mouth every 4 (four) hours as  needed for cough. (Patient not taking: No sig reported) 118 mL 0  . Lactulose 20 GM/30ML SOLN Take 30 mLs (20 g total) by mouth daily as needed. (Patient not taking: No sig reported)  450 mL 0  . metoCLOPramide (REGLAN) 10 MG tablet Take 1 tablet (10 mg total) by mouth 4 (four) times daily -  before meals and at bedtime. (Patient not taking: No sig reported) 120 tablet 0  . morphine (MS CONTIN) 15 MG 12 hr tablet Take 15 mg by mouth every 12 (twelve) hours as needed for pain. (Patient not taking: No sig reported)    . ondansetron (ZOFRAN-ODT) 4 MG disintegrating tablet Take 1 tablet (4 mg total) by mouth every 8 (eight) hours as needed for nausea or vomiting. (Patient not taking: No sig reported) 20 tablet 0  . spironolactone (ALDACTONE) 25 MG tablet Take 1 tablet by mouth daily.    . tamsulosin (FLOMAX) 0.4 MG CAPS capsule Take 1 capsule by mouth daily. (Patient not taking: No sig reported)      Objective: BP 138/70 Comment: 2lpm [place]  Pulse 75 Comment: 2lpm [place]  Temp 97.8 F (36.6 C) (Oral)   Resp (!) 22 Comment: 2lpm [place]  Ht 6' (1.829 m)   Wt 80.7 kg   SpO2 98% Comment: 2lpm [place]  BMI 24.14 kg/m  Exam: General: Patient laying comfortably in bed, in no acute distress. Eyes: sclera white Neck: supple Cardiovascular: irregularly irregular rhythm, no murmurs or gallops auscultated  Respiratory: CTAB, breathing comfortably on 2.5 L O2, no rales or rhonchi noted Gastrointestinal: soft, nontender, BS+ MSK: tenderness lateral to thoracic spine on the right on light touch with hematoma noted, no chest wall tenderness noted on palpation  Derm: skin warm and dry to touch Neuro: AOx4, gross sensation intact, 5/5 right UE strength and bilateral grip, 4/5 left UE strength, appropriately conversational, follows all commands  Psych: mood appropriate   Labs and Imaging: CBC BMET  Recent Labs  Lab 09/30/20 1326  WBC 9.6  HGB 15.0  HCT 43.1  PLT 254   Recent Labs  Lab 09/30/20 1326  NA 133*  K  3.6  CL 101  CO2 21*  BUN 12  CREATININE 1.04  GLUCOSE 153*  CALCIUM 9.3     EKG: HR 70, normal sinus rhythm, non-elevated T waves without inversion, QTc 449, possible ST elevation noted in inferior lead  DG Chest Portable 1 View  Result Date: 09/30/2020 CLINICAL DATA:  Chest pain and shortness of breath. EXAM: PORTABLE CHEST 1 VIEW COMPARISON:  Chest x-ray dated 09/17/2020. FINDINGS: Heart size and mediastinal contours are stable. Median sternotomy wires appear intact and stable in alignment. Lungs are clear. No pleural effusion or pneumothorax is seen. IMPRESSION: No active disease. No evidence of pneumonia or pulmonary edema. Electronically Signed   By: Franki Cabot M.D.   On: 09/30/2020 15:22    Donney Dice DO  PGY-1, [place] Intern pager: [phone], text pages welcome  FPTS Upper-Level Resident Addendum   I have independently interviewed and examined the patient. I have discussed the above with the original author and agree with their documentation. My edits for correction/addition/clarification are included above. Please see any attending notes.   Eulis Foster, MD PGY-2, [place] 09/30/2020 6:06 PM  [facility] Service pager: [phone] (text pages welcome through [place])

## 2020-09-30 NOTE — ED Triage Notes (Signed)
Pt bib ems from [facility] with CP radiating to left arm and back of neck beginning around noon. Pt was pale, clammy and nauseous upon arrival. Pt given 1 NTG with little change and no ASA d/t an allergy. Pt given 4 mg Zofran, V/S stable

## 2020-09-30 NOTE — ED Notes (Signed)
EDP and pt discussing pain and narcotics.

## 2020-09-30 NOTE — ED Notes (Signed)
Report attempted 

## 2020-09-30 NOTE — ED Provider Notes (Signed)
[facility] EMERGENCY DEPARTMENT Provider Note   CSN: [id number] Arrival date & time: 09/30/20  1251     History Chief Complaint  Patient presents with  . Chest Pain    Bryan Scott is a 75 y.o. male with past medical history of HTN, HLD, type II DM, atrial fibrillation, recent admission for cardiac syncope, and CAD s/p CABG as well as NSTEMI with coronary stent in 2022 on Plavix and Xarelto who presents the ED with complaints of left-sided chest pain rating towards his left arm.  I reviewed patient's medical record and he was previously followed by cardiology at the [facility].  He states that he is now followed by [employer] at [facility] in [place].  His most recent cardiac catheterization performed earlier this year demonstrated multivessel CAD.  Successful PCI to SVG.  On my examination, patient reports that he was sitting in recliner when he developed sudden onset left-sided chest pain that radiated to his left shoulder and right left side of his neck.  He states that it is constant, 10 out of 10.  It did not improve with the sublingual nitroglycerin administered by EMS.  He states that he was overcome with shortness of breath symptoms and also endorsed diaphoresis and nausea.  He states he felt like he was about to pass out and staff had to help him to his feet before he was transferred via EMS.  Patient states that he has had recent falls and has significant bruising on multiple parts of his body, but was not in any significant discomfort.  He states that they had been taking good care of him at the facility and denied any recent illness or infection.  He had been feeling well up until his incident of chest pain today.  He is accompanied by his wife was at bedside.   HPI     Past Medical History:  Diagnosis Date  . 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 Back injuries   . Bladder cancer ([place])   . Cancer ([place])   . Carotid artery disease (HCC)    > 75% bilateral  ICA stenoses on 01/2013 CT  . Chronic pain syndrome   . Coronary artery disease    s/p CABG ~ 2007  . 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 GERD (gastroesophageal reflux disease)   . Heparin allergy    Bleeding  . History of tobacco use   . Hx of CABG   . Hx of cardiac cath   . Hyperlipidemia   . Hypertension   . Ischemic cardiomyopathy   . Paroxysmal atrial fibrillation (HCC)    On Xarelto anticoagulation  . Type 2 diabetes mellitus ([place])    A1C 6.8% in 01/2013    Patient Active Problem List   Diagnosis Date Noted  . 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 Non-intractable vomiting   . Dehydration   . Fall   . Injury   . Malnutrition of moderate degree 09/19/2020  . Protein-calorie malnutrition ([place]) 09/18/2020  . Syncope 09/17/2020  . History of pancreatitis   . Cervical radiculopathy   . Acute pancreatitis 08/07/2020  . Hyperlipidemia associated with type 2 diabetes mellitus ([place]) 08/07/2020  . NSTEMI (non-ST elevated myocardial infarction) ([place]) 07/11/2020  . AKI (acute kidney injury) ([place]) 07/11/2020  . Acute metabolic encephalopathy 29/56/2130  . GERD (gastroesophageal reflux disease) 09/09/2019  . CAD (coronary artery disease) 09/09/2019  . Chronic pain syndrome 09/09/2019  . Leukocytosis 09/09/2019  . Shortness  of breath   . Hyponatremia 09/26/2016  . Chest pain 09/22/2016  . 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  . Abnormal EKG 09/21/2016  . Chronic pain 09/21/2016  . PAF (paroxysmal atrial fibrillation) ([place]) 09/21/2016  . Essential hypertension 05/02/2016  . Carotid stenosis 05/02/2016  . CAD in native artery   . Atypical chest pain   . DM (diabetes mellitus), type 2 with peripheral vascular complications ([place])   . Hyperlipidemia   . Pain of left upper extremity   . Angina pectoris ([place])   . Abdominal pain   . Chest pain, central 03/13/2015  . Hypertensive urgency 03/13/2015  . Spinal stenosis 01/10/2012    Past  Surgical History:  Procedure Laterality Date  . CARDIAC CATHETERIZATION    . CORONARY ARTERY BYPASS GRAFT  2007  . CORONARY STENT INTERVENTION N/A 07/13/2020   Procedure: CORONARY STENT INTERVENTION;  Surgeon: Nelva Bush, MD;  Location: [facility];  Service: Cardiovascular;  Laterality: N/A;  . ESOPHAGOGASTRODUODENOSCOPY (EGD) WITH PROPOFOL N/A 09/20/2020   Procedure: ESOPHAGOGASTRODUODENOSCOPY (EGD) WITH PROPOFOL;  Surgeon: Ladene Artist, MD;  Location: [facility] ENDOSCOPY;  Service: Endoscopy;  Laterality: N/A;  . LEFT HEART CATH AND CORS/GRAFTS ANGIOGRAPHY N/A 07/13/2020   Procedure: LEFT HEART CATH AND CORS/GRAFTS ANGIOGRAPHY;  Surgeon: Minna Merritts, MD;  Location: [facility];  Service: Cardiovascular;  Laterality: N/A;       Family History  Problem Relation Age of Onset  . Heart disease Mother 39       Deceased  . CVA Mother   . Seizures Father 53       Deceased from complications with seizures    Social History   Tobacco Use  . Smoking status: Former Smoker    Packs/day: 1.00    Years: 30.00    Pack years: 30.00    Types: Cigarettes  . Smokeless tobacco: Never Used  . Tobacco comment: 30 pack-year history. Quit 8-9 years ago.   Vaping Use  . Vaping Use: Never used  Substance Use Topics  . Alcohol use: Not Currently  . Drug use: Never    Home Medications Prior to Admission medications   Medication Sig Start Date End Date Taking? Authorizing Provider  acetaminophen (TYLENOL) 325 MG tablet Take 2 tablets (650 mg total) by mouth every 6 (six) hours. 09/25/20  Yes Welborn, Ryan, DO  atorvastatin (LIPITOR) 80 MG tablet Take 80 mg by mouth daily.   Yes [provider]  carvedilol (COREG) 3.125 MG tablet Take 1 tablet (3.125 mg total) by mouth 2 (two) times daily with a meal. 07/19/20  Yes Shawna Clamp, MD  chlorthalidone (HYGROTON) 25 MG tablet Take 1 tablet by mouth daily. 02/01/20 02/01/21 Yes [provider]  clopidogrel (PLAVIX)  75 MG tablet Take 75 mg by mouth every morning. 07/24/20  Yes [provider]  DULoxetine (CYMBALTA) 30 MG capsule Take 30 mg by mouth daily.   Yes [provider]  empagliflozin (JARDIANCE) 25 MG TABS tablet Take 25 mg by mouth daily.   Yes [provider]  ezetimibe (ZETIA) 10 MG tablet Take 1 tablet (10 mg total) by mouth daily. 09/11/19  Yes Fritzi Mandes, MD  isosorbide mononitrate (IMDUR) 30 MG 24 hr tablet Take 1 tablet (30 mg total) by mouth daily. 12/14/19  Yes Mikhail, Maryann, DO  melatonin 5 MG TABS Take 5 mg by mouth at bedtime.   Yes [provider]  metFORMIN (GLUCOPHAGE) 500 MG tablet Take 500 mg by  mouth 2 (two) times daily with a meal.   Yes [provider]  Multiple Vitamin (MULTIVITAMIN WITH MINERALS) TABS tablet Take 1 tablet by mouth daily. 08/10/20  Yes Fritzi Mandes, MD  nitroGLYCERIN (NITROSTAT) 0.4 MG SL tablet Place 1 tablet (0.4 mg total) under the tongue every 5 (five) minutes as needed for chest pain. 07/19/20  Yes Shawna Clamp, MD  oxyCODONE (OXY IR/ROXICODONE) 5 MG immediate release tablet Take 0.5 tablets (2.5 mg total) by mouth every 6 (six) hours as needed for up to 5 days for severe pain. 09/25/20 09/30/20 Yes Welborn, Ryan, DO  pantoprazole (PROTONIX) 40 MG tablet Take 1 tablet (40 mg total) by mouth daily. 09/06/20 10/06/20 Yes Sharen Hones, MD  polyethylene glycol (MIRALAX / GLYCOLAX) 17 g packet Take 17 g by mouth daily. 09/25/20  Yes Welborn, Ryan, DO  rivaroxaban (XARELTO) 20 MG TABS tablet Take 1 tablet (20 mg total) by mouth daily with supper. 09/06/20  Yes Sharen Hones, MD  senna-docusate (SENOKOT-S) 8.6-50 MG tablet Take 2 tablets by mouth 2 (two) times daily. 09/06/20  Yes Sharen Hones, MD  sucralfate (CARAFATE) 1 g tablet Take 1 tablet (1 g total) by mouth 4 (four) times daily -  with meals and at bedtime. 09/06/20  Yes Sharen Hones, MD  Tetrahydrozoline HCl (VISINE OP) Place 1 drop into both eyes daily as needed (dry red  eyes).   Yes [provider]  amLODipine (NORVASC) 10 MG tablet Take 1 tablet by mouth daily. Patient not taking: No sig reported 05/04/20 05/05/21  [provider]  gabapentin (NEURONTIN) 100 MG capsule Take 100 mg by mouth 3 (three) times daily. Patient not taking: No sig reported    [provider]  glipiZIDE (GLUCOTROL) 10 MG tablet Take 10 mg by mouth 2 (two) times daily before a meal. Patient not taking: No sig reported    [provider]  guaiFENesin-dextromethorphan (ROBITUSSIN DM) 100-10 MG/5ML syrup Take 5 mLs by mouth every 4 (four) hours as needed for cough. Patient not taking: No sig reported 07/16/19   Thornell Mule, MD  Lactulose 20 GM/30ML SOLN Take 30 mLs (20 g total) by mouth daily as needed. Patient not taking: No sig reported 09/06/20   Sharen Hones, MD  metoCLOPramide (REGLAN) 10 MG tablet Take 1 tablet (10 mg total) by mouth 4 (four) times daily -  before meals and at bedtime. Patient not taking: No sig reported 09/25/20   Lurline Del, DO  morphine (MS CONTIN) 15 MG 12 hr tablet Take 15 mg by mouth every 12 (twelve) hours as needed for pain. Patient not taking: No sig reported    [provider]  ondansetron (ZOFRAN-ODT) 4 MG disintegrating tablet Take 1 tablet (4 mg total) by mouth every 8 (eight) hours as needed for nausea or vomiting. Patient not taking: No sig reported 09/25/20   Lurline Del, DO  spironolactone (ALDACTONE) 25 MG tablet Take 1 tablet by mouth daily. 02/01/20 02/01/21  [provider]  tamsulosin (FLOMAX) 0.4 MG CAPS capsule Take 1 capsule by mouth daily. Patient not taking: No sig reported 07/10/20 07/11/21  [provider]    Allergies    Aspergum [aspirin], Aspirin, Lisinopril, Angiotensin receptor blockers, and Heparin  Review of Systems   Review of Systems  All other systems reviewed and are negative.   Physical Exam Updated Vital Signs BP 138/68 Comment: 2lpm [place]  Pulse 70  Comment: 2lpm [place]  Temp (!) 97.5 F (36.4 C) (Oral)   Resp 20  Comment: 2lpm [place]  Ht 6' (1.829 m)   Wt 80.7 kg   SpO2 97% Comment: 2lpm [place]  BMI 24.14 kg/m   Physical Exam Vitals and nursing note reviewed. Exam conducted with a chaperone present.  Constitutional:      Appearance: Normal appearance.  HENT:     Head: Normocephalic and atraumatic.  Eyes:     General: No scleral icterus.    Conjunctiva/sclera: Conjunctivae normal.  Pulmonary:     Effort: Pulmonary effort is normal.  Skin:    General: Skin is dry.  Neurological:     Mental Status: He is alert.     GCS: GCS eye subscore is 4. GCS verbal subscore is 5. GCS motor subscore is 6.  Psychiatric:        Mood and Affect: Mood normal.        Behavior: Behavior normal.        Thought Content: Thought content normal.     ED Results / Procedures / Treatments   Labs (all labs ordered are listed, but only abnormal results are displayed) Labs Reviewed  BASIC METABOLIC PANEL - Abnormal; Notable for the following components:      Result Value   Sodium 133 (*)    CO2 21 (*)    Glucose, Bld 153 (*)    All other components within normal limits  CBC  TROPONIN I (HIGH SENSITIVITY)  TROPONIN I (HIGH SENSITIVITY)    EKG None  Radiology DG Chest Portable 1 View  Result Date: 09/30/2020 CLINICAL DATA:  Chest pain and shortness of breath. EXAM: PORTABLE CHEST 1 VIEW COMPARISON:  Chest x-ray dated 09/17/2020. FINDINGS: Heart size and mediastinal contours are stable. Median sternotomy wires appear intact and stable in alignment. Lungs are clear. No pleural effusion or pneumothorax is seen. IMPRESSION: No active disease. No evidence of pneumonia or pulmonary edema. Electronically Signed   By: Franki Cabot M.D.   On: 09/30/2020 15:22    Procedures Procedures   Medications Ordered in ED Medications  nitroGLYCERIN 50 mg in dextrose 5 % 250 mL (0.2 mg/mL) infusion (35 mcg/min Intravenous Rate/Dose Change 09/30/20 1435)  fentaNYL  (SUBLIMAZE) injection 50 mcg (50 mcg Intravenous Given 09/30/20 1454)    ED Course  I have reviewed the triage vital signs and the nursing notes.  Pertinent labs & imaging results that were available during my care of the patient were reviewed by me and considered in my medical decision making (see chart for details).  Clinical Course as of 09/30/20 1602  Sun Sep 30, 2020  1445 I spoke with Dr. Margaretann Loveless, cardiology, who recommends hospitalist admission and that they be re-consulted if his second troponin is elevated. [GG]  [id number] I spoke with family medicine teaching service who will see admit patient. [GG]    Clinical Course User Index [GG] Corena Herter, PA-C   MDM Rules/Calculators/A&P                          RAIDYN WASSINK was evaluated in Emergency Department on 09/30/2020 for the symptoms described in the history of present illness. He was evaluated in the context of the global COVID-19 pandemic, which necessitated consideration that the patient might be at risk for infection with the SARS-CoV-2 virus that causes COVID-19. Institutional protocols and algorithms that pertain to the evaluation of patients at risk for COVID-19 are in a state of rapid change based on information released by regulatory bodies including the CDC and  federal and state organizations. These policies and algorithms were followed during the patient's care in the ED.  I personally reviewed patient's medical chart and all notes from triage and staff during today's encounter. I have also ordered and reviewed all labs and imaging that I felt to be medically necessary in the evaluation of this patient's complaints and with consideration of their physical exam. If needed, translation services were available and utilized.   Patient's history and physical exam is particular concerning for ACS.  Will consult cardiology.  Patient improved considerably from 10 out of 10 chest pain to 2 out of 10 chest pain after 30 mcg IV  nitroglycerin drip.  I spoke with Dr. Margaretann Loveless, cardiology, who recommends hospitalist admission and that they be re-consulted if his second troponin is elevated.  I spoke with family medicine teaching service who will see admit patient.   Final Clinical Impression(s) / ED Diagnoses Final diagnoses:  Chest pain, unspecified type    Rx / DC Orders ED Discharge Orders    None       Reita Chard 09/30/20 1602    Luna Fuse, MD 10/01/20 [phone]

## 2020-09-30 NOTE — Progress Notes (Signed)
Pt admitted to [facility] from [facility] ED.  Pt is A&OX4 and neuro intact.  Pt placed on telemetry and CCMD notified.  CHG bath completed.  Pt still having chest pain, stated to be 7 on 1-10 pain scale.  Pt on nitro drip at 35 mcg currently and be given fentanyl for pain.  Vitals taken and within normal range, with exception of BP 154/87 (101).

## 2020-10-01 DIAGNOSIS — R0789 Other chest pain: Secondary | ICD-10-CM | POA: Diagnosis present

## 2020-10-01 DIAGNOSIS — E1165 Type 2 diabetes mellitus with hyperglycemia: Secondary | ICD-10-CM | POA: Diagnosis present

## 2020-10-01 DIAGNOSIS — Z20822 Contact with and (suspected) exposure to covid-19: Secondary | ICD-10-CM | POA: Diagnosis present

## 2020-10-01 DIAGNOSIS — Z7901 Long term (current) use of anticoagulants: Secondary | ICD-10-CM | POA: Diagnosis not present

## 2020-10-01 DIAGNOSIS — K3184 Gastroparesis: Secondary | ICD-10-CM | POA: Diagnosis present

## 2020-10-01 DIAGNOSIS — I252 Old myocardial infarction: Secondary | ICD-10-CM | POA: Diagnosis not present

## 2020-10-01 DIAGNOSIS — I959 Hypotension, unspecified: Secondary | ICD-10-CM | POA: Diagnosis not present

## 2020-10-01 DIAGNOSIS — I2510 Atherosclerotic heart disease of native coronary artery without angina pectoris: Secondary | ICD-10-CM | POA: Diagnosis present

## 2020-10-01 DIAGNOSIS — Z87891 Personal history of nicotine dependence: Secondary | ICD-10-CM | POA: Diagnosis not present

## 2020-10-01 DIAGNOSIS — E871 Hypo-osmolality and hyponatremia: Secondary | ICD-10-CM | POA: Diagnosis present

## 2020-10-01 DIAGNOSIS — K219 Gastro-esophageal reflux disease without esophagitis: Secondary | ICD-10-CM | POA: Diagnosis present

## 2020-10-01 DIAGNOSIS — R079 Chest pain, unspecified: Secondary | ICD-10-CM | POA: Diagnosis present

## 2020-10-01 DIAGNOSIS — Z79899 Other long term (current) drug therapy: Secondary | ICD-10-CM | POA: Diagnosis not present

## 2020-10-01 DIAGNOSIS — I10 Essential (primary) hypertension: Secondary | ICD-10-CM | POA: Diagnosis present

## 2020-10-01 DIAGNOSIS — E785 Hyperlipidemia, unspecified: Secondary | ICD-10-CM | POA: Diagnosis present

## 2020-10-01 DIAGNOSIS — I952 Hypotension due to drugs: Secondary | ICD-10-CM | POA: Diagnosis not present

## 2020-10-01 DIAGNOSIS — R072 Precordial pain: Secondary | ICD-10-CM | POA: Diagnosis not present

## 2020-10-01 DIAGNOSIS — N179 Acute kidney failure, unspecified: Secondary | ICD-10-CM | POA: Diagnosis not present

## 2020-10-01 DIAGNOSIS — Z7984 Long term (current) use of oral hypoglycemic drugs: Secondary | ICD-10-CM | POA: Diagnosis not present

## 2020-10-01 DIAGNOSIS — E1143 Type 2 diabetes mellitus with diabetic autonomic (poly)neuropathy: Secondary | ICD-10-CM | POA: Diagnosis present

## 2020-10-01 DIAGNOSIS — I480 Paroxysmal atrial fibrillation: Secondary | ICD-10-CM | POA: Diagnosis present

## 2020-10-01 DIAGNOSIS — G894 Chronic pain syndrome: Secondary | ICD-10-CM | POA: Diagnosis present

## 2020-10-01 DIAGNOSIS — K5900 Constipation, unspecified: Secondary | ICD-10-CM | POA: Diagnosis present

## 2020-10-01 DIAGNOSIS — E1169 Type 2 diabetes mellitus with other specified complication: Secondary | ICD-10-CM | POA: Diagnosis present

## 2020-10-01 DIAGNOSIS — I440 Atrioventricular block, first degree: Secondary | ICD-10-CM | POA: Diagnosis present

## 2020-10-01 DIAGNOSIS — Z7902 Long term (current) use of antithrombotics/antiplatelets: Secondary | ICD-10-CM | POA: Diagnosis not present

## 2020-10-01 DIAGNOSIS — Z8551 Personal history of malignant neoplasm of bladder: Secondary | ICD-10-CM | POA: Diagnosis not present

## 2020-10-01 LAB — COMPREHENSIVE METABOLIC PANEL
ALT: 20 U/L (ref 0–44)
AST: 18 U/L (ref 15–41)
Albumin: 3.3 g/dL — ABNORMAL LOW (ref 3.5–5.0)
Alkaline Phosphatase: 91 U/L (ref 38–126)
Anion gap: 11 (ref 5–15)
BUN: 11 mg/dL (ref 8–23)
CO2: 18 mmol/L — ABNORMAL LOW (ref 22–32)
Calcium: 8.9 mg/dL (ref 8.9–10.3)
Chloride: 104 mmol/L (ref 98–111)
Creatinine, Ser: 1.12 mg/dL (ref 0.61–1.24)
GFR, Estimated: >60 mL/min (ref >60)
Glucose, Bld: 100 mg/dL — ABNORMAL HIGH (ref 70–99)
Potassium: 3.8 mmol/L (ref 3.5–5.1)
Sodium: 133 mmol/L — ABNORMAL LOW (ref 135–145)
Total Bilirubin: 2.6 mg/dL — ABNORMAL HIGH (ref 0.3–1.2)
Total Protein: 6.1 g/dL — ABNORMAL LOW (ref 6.5–8.1)

## 2020-10-01 LAB — CBC
HCT: 43.3 % (ref 39.0–52.0)
Hemoglobin: 15 g/dL (ref 13.0–17.0)
MCH: 33.5 pg (ref 26.0–34.0)
MCHC: 34.6 g/dL (ref 30.0–36.0)
MCV: 96.7 fL (ref 80.0–100.0)
Platelets: 241 10*3/uL (ref 150–400)
RBC: 4.48 MIL/uL (ref 4.22–5.81)
RDW: 13.7 % (ref 11.5–15.5)
WBC: 10.2 10*3/uL (ref 4.0–10.5)
nRBC: 0 % (ref 0.0–0.2)

## 2020-10-01 LAB — TROPONIN I (HIGH SENSITIVITY)
Troponin I (High Sensitivity): 15 ng/L (ref <18)
Troponin I (High Sensitivity): 14 ng/L (ref <18)

## 2020-10-01 LAB — GLUCOSE, CAPILLARY
Glucose-Capillary: 95 mg/dL (ref 70–99)
Glucose-Capillary: 105 mg/dL — ABNORMAL HIGH (ref 70–99)
Glucose-Capillary: 144 mg/dL — ABNORMAL HIGH (ref 70–99)
Glucose-Capillary: 152 mg/dL — ABNORMAL HIGH (ref 70–99)

## 2020-10-01 LAB — MAGNESIUM: Magnesium: 1.7 mg/dL (ref 1.7–2.4)

## 2020-10-01 LAB — TSH: TSH: 2.495 u[IU]/mL (ref 0.350–4.500)

## 2020-10-01 MED ORDER — ISOSORBIDE MONONITRATE ER 60 MG PO TB24
120.0000 mg | ORAL_TABLET | Freq: Every day | ORAL | Status: DC
  Administered 2020-10-02 – 2020-10-03 (×2): 120 mg via ORAL
  Filled 2020-10-01 (×2): qty 2

## 2020-10-01 MED ORDER — ISOSORBIDE MONONITRATE ER 30 MG PO TB24
30.0000 mg | ORAL_TABLET | Freq: Every day | ORAL | Status: DC
  Administered 2020-10-01: 30 mg via ORAL
  Filled 2020-10-01: qty 1

## 2020-10-01 MED ORDER — ONDANSETRON 4 MG PO TBDP
4.0000 mg | ORAL_TABLET | Freq: Three times a day (TID) | ORAL | Status: DC | PRN
  Administered 2020-10-01: 4 mg via ORAL
  Filled 2020-10-01: qty 1

## 2020-10-01 MED ORDER — CARVEDILOL 6.25 MG PO TABS
6.2500 mg | ORAL_TABLET | Freq: Two times a day (BID) | ORAL | Status: DC
  Administered 2020-10-01 – 2020-10-04 (×7): 6.25 mg via ORAL
  Filled 2020-10-01 (×7): qty 1

## 2020-10-01 NOTE — NC FL2 (Signed)
[place] LEVEL OF CARE SCREENING TOOL     IDENTIFICATION  Patient Name: Bryan Scott Birthdate: [birth year] Sex: male Admission Date (Current Location): 09/30/2020  [facility] and [place] Number:  Herbalist and Address:  [facility], [place] [address] [place], [place] [address]      Provider Number: [id number]  Attending Physician Name and Address:  Martyn Malay, MD  Relative Name and Phone Number:  Hinda Kehr [phone]    Current Level of Care: Hospital Recommended Level of Care: [place] Prior Approval Number:    Date Approved/Denied:   PASRR Number: [id number]  Discharge Plan: SNF    Current Diagnoses: Patient Active Problem List   Diagnosis Date Noted  . 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 Non-intractable vomiting   . Dehydration   . Fall   . Injury   . Malnutrition of moderate degree 09/19/2020  . Protein-calorie malnutrition ([place]) 09/18/2020  . Syncope 09/17/2020  . History of pancreatitis   . Cervical radiculopathy   . Acute pancreatitis 08/07/2020  . Hyperlipidemia associated with type 2 diabetes mellitus ([place]) 08/07/2020  . NSTEMI (non-ST elevated myocardial infarction) ([place]) 07/11/2020  . AKI (acute kidney injury) ([place]) 07/11/2020  . Acute metabolic encephalopathy 60/63/0160  . GERD (gastroesophageal reflux disease) 09/09/2019  . CAD (coronary artery disease) 09/09/2019  . Chronic pain syndrome 09/09/2019  . Leukocytosis 09/09/2019  . Shortness of breath   . Hyponatremia 09/26/2016  . Chest pain 09/22/2016  . 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  . Abnormal EKG 09/21/2016  . Chronic pain 09/21/2016  . PAF (paroxysmal atrial fibrillation) ([place]) 09/21/2016  . Essential hypertension 05/02/2016  . Carotid stenosis 05/02/2016  . CAD in native artery   . Atypical chest pain   . DM (diabetes mellitus), type 2 with peripheral vascular complications ([place])    . Hyperlipidemia   . Pain of left upper extremity   . Angina pectoris ([place])   . Abdominal pain   . Chest pain, central 03/13/2015  . Hypertensive urgency 03/13/2015  . Spinal stenosis 01/10/2012    Orientation RESPIRATION BLADDER Height & Weight     Self,Time,Situation,Place  Normal Continent Weight: 178 lb (80.7 kg) Height:  6' (182.9 cm)  BEHAVIORAL SYMPTOMS/MOOD NEUROLOGICAL BOWEL NUTRITION STATUS      Continent Diet (See DC summary)  AMBULATORY STATUS COMMUNICATION OF NEEDS Skin   Limited Assist Verbally                         Personal Care Assistance Level of Assistance  Bathing,Feeding,Dressing Bathing Assistance: Limited assistance Feeding assistance: Limited assistance Dressing Assistance: Limited assistance     Functional Limitations Info  Sight,Hearing,Speech Sight Info: Adequate Hearing Info: Adequate Speech Info: Adequate    SPECIAL CARE FACTORS FREQUENCY  PT (By licensed PT),OT (By licensed OT)     PT Frequency: 5x week OT Frequency: 5x week            Contractures Contractures Info: Not present    Additional Factors Info  Code Status,Allergies,Insulin Sliding Scale Code Status Info: Full Allergies Info: Aspergum (Aspirin)   Aspirin   Lisinopril   Angiotensin Receptor Blockers   Heparin   Insulin Sliding Scale Info: Insulin Aspart (Novolog) 0-9 U 3x daily w/ meals       Current Medications (10/01/2020):  This is the current hospital active medication list Current Facility-Administered Medications  Medication Dose Route Frequency Provider Last Rate  Last Admin  . acetaminophen (TYLENOL) tablet 650 mg  650 mg Oral Q4H Simmons-Robinson, Makiera, MD   650 mg at 10/01/20 1344  . atorvastatin (LIPITOR) tablet 80 mg  80 mg Oral Daily Ganta, Anupa, DO   80 mg at 10/01/20 0914  . carvedilol (COREG) tablet 6.25 mg  6.25 mg Oral BID WC Minus Breeding, MD      . chlorthalidone (HYGROTON) tablet 25 mg  25 mg Oral Daily Ganta, Anupa, DO   25 mg at  10/01/20 0914  . clopidogrel (PLAVIX) tablet 75 mg  75 mg Oral q morning Ganta, Anupa, DO   75 mg at 10/01/20 0914  . DULoxetine (CYMBALTA) DR capsule 30 mg  30 mg Oral Daily Ganta, Anupa, DO   30 mg at 10/01/20 0913  . ezetimibe (ZETIA) tablet 10 mg  10 mg Oral Daily Ganta, Anupa, DO   10 mg at 10/01/20 0913  . gabapentin (NEURONTIN) capsule 100 mg  100 mg Oral TID Zola Button, MD   100 mg at 10/01/20 0914  . insulin aspart (novoLOG) injection 0-9 Units  0-9 Units Subcutaneous TID WC Ganta, Anupa, DO   2 Units at 09/30/20 1905  . [START ON 10/02/2020] isosorbide mononitrate (IMDUR) 24 hr tablet 120 mg  120 mg Oral Daily Margie Billet, NP      . melatonin tablet 3 mg  3 mg Oral QHS PRN Zola Button, MD   3 mg at 09/30/20 2328  . morphine 2 MG/ML injection 2 mg  2 mg Intravenous Q4H PRN Simmons-Robinson, Makiera, MD   2 mg at 10/01/20 1344  . nitroGLYCERIN (NITROSTAT) SL tablet 0.4 mg  0.4 mg Sublingual Q5 min PRN Ganta, Anupa, DO      . pantoprazole (PROTONIX) EC tablet 40 mg  40 mg Oral Daily Ganta, Anupa, DO   40 mg at 10/01/20 0913  . polyethylene glycol (MIRALAX / GLYCOLAX) packet 17 g  17 g Oral Daily Ganta, Anupa, DO   17 g at 10/01/20 0915  . rivaroxaban (XARELTO) tablet 20 mg  20 mg Oral Q supper Ganta, Anupa, DO      . spironolactone (ALDACTONE) tablet 25 mg  25 mg Oral Daily Ganta, Anupa, DO   25 mg at 10/01/20 0913  . sucralfate (CARAFATE) tablet 1 g  1 g Oral TID WC & HS Ganta, Anupa, DO   1 g at 10/01/20 1344     Discharge Medications: Please see discharge summary for a list of discharge medications.  Relevant Imaging Results:  Relevant Lab Results:   Additional Information SS Gilbert, LCSWA

## 2020-10-01 NOTE — Progress Notes (Signed)
Occupational Therapy Evaluation Patient Details Name: Bryan Scott MRN: [id number] DOB: June 01, 1946 Today's Date: 10/01/2020    History of Present Illness 75 y.o. male presenting with left sided chest pain. PMH is significant for atrial fibrillation, GERD, CAD s/p CABG and coronary stent placement, hypertension, hyperlipidemia, type 2 DM and spinal stenosis.   Clinical Impression   PTA pt undergoing rehab at [facility] after recent admission 4/4 where he had a fall from a syncopal event. Prior to his admission on 4/4, he lived at home alone, drove and was independent with ADL and mobility. Pt has  Sisters who "check on him" @ 3x/wk. Pt requires Min A with limited mobility and Mod A with ADL tasks. Will benefit from further rehab at SNF after DC. Pt's goal is to return home and live independently again. Will follow acutely.     Follow Up Recommendations  SNF;Supervision/Assistance - 24 hour    Equipment Recommendations  None recommended by OT    Recommendations for Other Services       Precautions / Restrictions Precautions Precautions: Fall Precaution Comments: chest/back pain      Mobility Bed Mobility Overal bed mobility: Needs Assistance       Supine to sit: Min assist     General bed mobility comments: painful; Min assist to transition trunk upright    Transfers Overall transfer level: Needs assistance   Transfers: Sit to/from Stand Sit to Stand: Min assist;+2 safety/equipment;+2 physical assistance              Balance Overall balance assessment: History of Falls;Needs assistance   Sitting balance-Leahy Scale: Fair       Standing balance-Leahy Scale: Poor                             ADL either performed or assessed with clinical judgement   ADL Overall ADL's : Needs assistance/impaired     Grooming: Set up;Sitting   Upper Body Bathing: Set up;Sitting   Lower Body Bathing: Moderate assistance;Sit to/from stand   Upper Body  Dressing : Set up;Sitting   Lower Body Dressing: Moderate assistance;Sit to/from stand   Toilet Transfer: Minimal assistance;RW;Stand-pivot   Toileting- Clothing Manipulation and Hygiene: Minimal assistance Toileting - Clothing Manipulation Details (indicate cue type and reason): using urinal independently     Functional mobility during ADLs: Minimal assistance;+2 for safety/equipment;+2 for physical assistance General ADL Comments: pt apprehensive about standing. Ablet o take steps along side of bed however would not ambulate     Vision         Perception     Praxis      Pertinent Vitals/Pain Pain Assessment: 0-10 Pain Score: 7  Pain Location: backadn chest Pain Descriptors / Indicators: Aching;Discomfort;Grimacing;Guarding;Moaning Pain Intervention(s): Limited activity within patient's tolerance  Discussed with nsg      Hand Dominance Right   Extremity/Trunk Assessment Upper Extremity Assessment Upper Extremity Assessment: Generalized weakness   Lower Extremity Assessment Lower Extremity Assessment: Defer to PT evaluation   Cervical / Trunk Assessment Cervical / Trunk Assessment: Kyphotic Cervical / Trunk Exceptions: hx of chronic back pain   Communication     Cognition Arousal/Alertness: Awake/alert Behavior During Therapy: WFL for tasks assessed/performed Overall Cognitive Status: No family/caregiver present to determine baseline cognitive functioning                     Current Attention Level: Selective       Awareness: Emergent  General Comments: most likely baseline; joking with therapists   General Comments  VSS on RA    Exercises     Shoulder Instructions      Home Living Family/patient expects to be discharged to:: Skilled nursing facility                                        Prior Functioning/Environment Level of Independence: Independent with assistive device(s)  Gait / Transfers Assistance Needed: Min A  RW - was walking @ 30 ft with chair follow at SNF ADL's / Homemaking Assistance Needed: Assitance from staff with bathing/dressing   Comments: prior to recent admission - lived independently with sisters checking on him @ 3x/wk; drove        OT Problem List: Decreased strength;Decreased activity tolerance;Impaired balance (sitting and/or standing);Decreased safety awareness;Decreased knowledge of use of DME or AE;Cardiopulmonary status limiting activity;Pain      OT Treatment/Interventions: Self-care/ADL training;Therapeutic exercise;Energy conservation;DME and/or AE instruction;Therapeutic activities;Patient/family education;Balance training    OT Goals(Current goals can be found in the care plan section) Acute Rehab OT Goals Patient Stated Goal: return to [facility] for continued rehab OT Goal Formulation: With patient Time For Goal Achievement: 10/15/20 Potential to Achieve Goals: Good  OT Frequency: Min 2X/week   Barriers to D/C:            Co-evaluation   Reason for Co-Treatment: To address functional/ADL transfers PT goals addressed during session: Mobility/safety with mobility        AM-PAC OT "6 Clicks" Daily Activity     Outcome Measure Help from another person eating meals?: None Help from another person taking care of personal grooming?: A Little Help from another person toileting, which includes using toliet, bedpan, or urinal?: A Little Help from another person bathing (including washing, rinsing, drying)?: A Lot Help from another person to put on and taking off regular upper body clothing?: A Little Help from another person to put on and taking off regular lower body clothing?: A Lot 6 Click Score: 17   End of Session Nurse Communication: Mobility status  Activity Tolerance: Patient tolerated treatment well Patient left: in bed;with call bell/phone within reach;with bed alarm set  OT Visit Diagnosis: Unsteadiness on feet (R26.81);Other abnormalities of gait  and mobility (R26.89);Muscle weakness (generalized) (M62.81);History of falling (Z91.81);Pain Pain - part of body:  (back)                Time: 1130-1150 OT Time Calculation (min): 20 min Charges:  OT General Charges $OT Visit: 1 Visit OT Evaluation $OT Eval Low Complexity: [place], OT/L   Acute OT Clinical Specialist Acute Rehabilitation Services Pager [phone] Office [phone]   [facility] 10/01/2020, 1:07 PM

## 2020-10-01 NOTE — Progress Notes (Signed)
FPTS Interim Progress Note  S: received page re: change in ekg this AM. Patient admitted for ACS r/o has been having left sided CP all night, vitals stable, patient without distress.  O: BP (!) 158/79 (BP Location: Right Arm)   Pulse 100   Temp 97.7 F (36.5 C) (Oral)   Resp 17   Ht 6' (1.829 m)   Wt 80.7 kg   SpO2 98%   BMI 24.14 kg/m   Gen: elderly WM, resting comfortably in bed, NAD Cardiac: RRR, no m/r/g, 2+ radial pulses  A/P: Chest pain EKG with t-wave inversions in V3-5, normal j point in V1 with ST elevation in that lead alone. Trops trended flat, will repeat now. Patient was recommend for admission with observation by cardiology, will consult them this AM. -repeat trops, call cards  Gladys Damme, MD 10/01/2020, 7:22 AM PGY-2, [place] [facility] pager [phone]

## 2020-10-01 NOTE — Consult Note (Addendum)
Cardiology Consultation:   Patient ID: Bryan Scott MRN: [id number]; DOB: [birth year]  Admit date: 09/30/2020 Date of Consult: 10/01/2020  PCP:  [facility] [place]  Cardiologist:  Nelva Bush, MD  Advanced Practice Provider:  No care team member to display Electrophysiologist:  None    Patient Profile:   Bryan Scott is a 75 y.o. male with a PMH of CAD s/p CABG in 2006 and PCI with DES to [place] on 07/13/2020, Paroxsymal A fib ( on Xarelto), type 2 DM, hypertension,  hyperlipidemia, carotid artery stenosis, cervical spinal stenosis s/p C5-C7 ACDF, chronic pain syndrome, hx of tobacco use, who presented to [facility] 09/30/20 for left sided chest pain. Cardiology is consulted for chest pain by hospitalist Dr Tawanna Solo.    History of Present Illness:   Bryan Scott with above PMH follows with cardiology at [facility] previously and recently decided to follow up with [facility] heart care at [facility]. He has known CAD, with hx of CABG x4 on 10/2004 at [facility]. Appears that he was lost to follow up with cardiology from 2007 to 2021. He has been hospitalized within [facility] system recurrently over the past 4 months. He was hospitalized here from 07/11/20 - 07/21/20 for NSTEMI. He underwent LHC 07/13/20 which showed multivessel CAD and bypass graft disease. Culprit lesion identified as 95% stenosis in SVG-RCA. Successful PCI was performed to SVG-RCA. He was transitioned from IV heparin to Xarelto, recommended plavix for 12 month at discharge (allergy to ASA),  along with GDMT with coreg, imdur, lipitor, zetia, and PRN nitro.   In brief, he had recurrent hospitalizations from 08/07/20-08/09/20 for  acute pancreatitis, AKI, and syncope 2/2 hypovolemia. He was hospitalized from 09/03/20-09/06/20 for chest pain, abdominal pain, nausea, vomiting, syncope due to hypovolemia, and indirect hyperbilirubinemia, had unremarkable lipase and MRCP and was recommend outpatient GI for EGD and colonoscopy  and questionable Gilbert's syndrome. He was seen by cardiology for chest pain and syncope that time, Echo repeat on 09/04/20 with EF 70-75%, Grade I DD. HS trop negative. EKG no change. He was felt having non-ischemic chest pain and recommend continue medical therapy with plavix, statin, Imdur, coreg, zetia; and instructed to resume Xarelto for A fib as it was stopped for unclear reason. He was hospitalized last on 09/17/2020-09/25/2020 for fall, skin laceration, back contusion, syncope 2/2 dehydration, pre-renal AKI, and persistent GI symptoms including abdominal pain, nausea, vomiting, diarrhea. He had EGD on 09/20/20 which was unremarkable, no masses, erosions, or abnormalities found. Patient discharged on protonix 40 mg, reglan 10 mg TID, and sucralfate 1g QID for gastroparesis. Cardiology was consulted at the time, felt syncope was due to dehydration from chronic GI loss, orthostatics was positive,  Hs Trop negative and EKG non-ischemic without acute telemetry events. He did have Plavix interrupted before EGD for 48 hours before EGD.   Patient returned to ER 09/30/20 reporting left sided chest pain radiating to his left arm. Pain occurred suddenly while he was sitting in the wheechair, it was sharp pain 10/10 , did not improve with SL nitro given by EMS. He reports associated SOB, diaphoresis, nausea, and near syncope. Diagnostic thus far showed mild hyponatremia 133, CBC and CMP otherwise grossly unremarkable. HS trop negative x3. EKG with lateral lead TWI, new from yesterday, but similar to 09/17/2020 EKG, non-specific . CXR showed no acute findings. He has remained febrile and HD stable, with BP elevated up to 167/90 over the past 24 hours. He was given  nitro gtt at ED, initially improved chest pain and later ineffective, PRN morphine was therefore started.  Cardiology is consulted for chest pain concern.   During encounter, patient states he continue to have left sided chest pain 8/10. Pain is not triggered by  exertion nor relieved by resting. He states pain is radiating to his left shoulder and arm, denied paresthesia. He states he was feeling SOB, diaphoresis, nausea with emesis yesterday at SNF. He felt pain is similar to his heart attack 15 years ago when he needed CABG. He reports mild dizziness with PT session today. He states his chest pain was improved on the Nitro gtt initally at ER, but pain became worse again later. He believed morphine is more helpful for his chest pain and is easing the pain from 10 to 8 on a sacle.  He has been taking his meds at SNF, reports no interruption of plavix or xarelto. He states his GI symptoms had resolved, no longer having nausea, vomiting, diarrhea, or abdominal pain. He states he is eating little, but tolerating PO intake. He denied any orthopnea, leg edema, or weight gain.     Past Medical History:  Diagnosis Date  . 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 Back injuries   . Bladder cancer ([place])   . Cancer ([place])   . Carotid artery disease (HCC)    > 75% bilateral ICA stenoses on 01/2013 CT  . Chronic pain syndrome   . Coronary artery disease    s/p CABG ~ 2007  . 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 GERD (gastroesophageal reflux disease)   . Heparin allergy    Bleeding  . History of tobacco use   . Hx of CABG   . Hx of cardiac cath   . Hyperlipidemia   . Hypertension   . Ischemic cardiomyopathy   . Paroxysmal atrial fibrillation (HCC)    On Xarelto anticoagulation  . Type 2 diabetes mellitus (HCC)    A1C 6.8% in 01/2013    Past Surgical History:  Procedure Laterality Date  . CARDIAC CATHETERIZATION    . CORONARY ARTERY BYPASS GRAFT  2007  . CORONARY STENT INTERVENTION N/A 07/13/2020   Procedure: CORONARY STENT INTERVENTION;  Surgeon: Nelva Bush, MD;  Location: [facility];  Service: Cardiovascular;  Laterality: N/A;  . ESOPHAGOGASTRODUODENOSCOPY (EGD) WITH PROPOFOL N/A 09/20/2020   Procedure:  ESOPHAGOGASTRODUODENOSCOPY (EGD) WITH PROPOFOL;  Surgeon: Ladene Artist, MD;  Location: [facility] ENDOSCOPY;  Service: Endoscopy;  Laterality: N/A;  . LEFT HEART CATH AND CORS/GRAFTS ANGIOGRAPHY N/A 07/13/2020   Procedure: LEFT HEART CATH AND CORS/GRAFTS ANGIOGRAPHY;  Surgeon: Minna Merritts, MD;  Location: [facility];  Service: Cardiovascular;  Laterality: N/A;     Home Medications:  Prior to Admission medications   Medication Sig Start Date End Date Taking? Authorizing Provider  acetaminophen (TYLENOL) 325 MG tablet Take 2 tablets (650 mg total) by mouth every 6 (six) hours. 09/25/20  Yes Welborn, [facility], DO  atorvastatin (LIPITOR) 80 MG tablet Take 80 mg by mouth daily.   Yes [provider]  carvedilol (COREG) 3.125 MG tablet Take 1 tablet (3.125 mg total) by mouth 2 (two) times daily with a meal. 07/19/20  Yes Shawna Clamp, MD  chlorthalidone (HYGROTON) 25 MG tablet Take 1 tablet by mouth daily. 02/01/20 02/01/21 Yes [provider]  clopidogrel (PLAVIX) 75 MG tablet Take 75 mg by mouth every morning. 07/24/20  Yes [provider]  DULoxetine (CYMBALTA) 30 MG  capsule Take 30 mg by mouth daily.   Yes [provider]  empagliflozin (JARDIANCE) 25 MG TABS tablet Take 25 mg by mouth daily.   Yes [provider]  ezetimibe (ZETIA) 10 MG tablet Take 1 tablet (10 mg total) by mouth daily. 09/11/19  Yes Fritzi Mandes, MD  isosorbide mononitrate (IMDUR) 30 MG 24 hr tablet Take 1 tablet (30 mg total) by mouth daily. 12/14/19  Yes Mikhail, Maryann, DO  melatonin 5 MG TABS Take 5 mg by mouth at bedtime.   Yes [provider]  metFORMIN (GLUCOPHAGE) 500 MG tablet Take 500 mg by mouth 2 (two) times daily with a meal.   Yes [provider]  Multiple Vitamin (MULTIVITAMIN WITH MINERALS) TABS tablet Take 1 tablet by mouth daily. 08/10/20  Yes Fritzi Mandes, MD  nitroGLYCERIN (NITROSTAT) 0.4 MG SL tablet Place 1 tablet (0.4 mg total) under the tongue  every 5 (five) minutes as needed for chest pain. 07/19/20  Yes Shawna Clamp, MD  pantoprazole (PROTONIX) 40 MG tablet Take 1 tablet (40 mg total) by mouth daily. 09/06/20 10/06/20 Yes Sharen Hones, MD  polyethylene glycol (MIRALAX / GLYCOLAX) 17 g packet Take 17 g by mouth daily. 09/25/20  Yes Welborn, [place], DO  rivaroxaban (XARELTO) 20 MG TABS tablet Take 1 tablet (20 mg total) by mouth daily with supper. 09/06/20  Yes Sharen Hones, MD  senna-docusate (SENOKOT-S) 8.6-50 MG tablet Take 2 tablets by mouth 2 (two) times daily. 09/06/20  Yes Sharen Hones, MD  sucralfate (CARAFATE) 1 g tablet Take 1 tablet (1 g total) by mouth 4 (four) times daily -  with meals and at bedtime. 09/06/20  Yes Sharen Hones, MD  Tetrahydrozoline HCl (VISINE OP) Place 1 drop into both eyes daily as needed (dry red eyes).   Yes [provider]  amLODipine (NORVASC) 10 MG tablet Take 1 tablet by mouth daily. Patient not taking: No sig reported 05/04/20 05/05/21  [provider]  gabapentin (NEURONTIN) 100 MG capsule Take 100 mg by mouth 3 (three) times daily. Patient not taking: No sig reported    [provider]  glipiZIDE (GLUCOTROL) 10 MG tablet Take 10 mg by mouth 2 (two) times daily before a meal. Patient not taking: No sig reported    [provider]  guaiFENesin-dextromethorphan (ROBITUSSIN DM) 100-10 MG/5ML syrup Take 5 mLs by mouth every 4 (four) hours as needed for cough. Patient not taking: No sig reported 07/16/19   Thornell Mule, MD  Lactulose 20 GM/30ML SOLN Take 30 mLs (20 g total) by mouth daily as needed. Patient not taking: No sig reported 09/06/20   Sharen Hones, MD  metoCLOPramide (REGLAN) 10 MG tablet Take 1 tablet (10 mg total) by mouth 4 (four) times daily -  before meals and at bedtime. Patient not taking: No sig reported 09/25/20   Lurline Del, DO  morphine (MS CONTIN) 15 MG 12 hr tablet Take 15 mg by mouth every 12 (twelve) hours as needed for pain. Patient not taking:  No sig reported    [provider]  ondansetron (ZOFRAN-ODT) 4 MG disintegrating tablet Take 1 tablet (4 mg total) by mouth every 8 (eight) hours as needed for nausea or vomiting. Patient not taking: No sig reported 09/25/20   Lurline Del, DO  spironolactone (ALDACTONE) 25 MG tablet Take 1 tablet by mouth daily. 02/01/20 02/01/21  [provider]  tamsulosin (FLOMAX) 0.4 MG CAPS capsule Take 1 capsule by mouth daily. Patient not taking: No sig reported 07/10/20  07/11/21  [provider]    Inpatient Medications: Scheduled Meds: . acetaminophen  650 mg Oral Q4H  . atorvastatin  80 mg Oral Daily  . carvedilol  3.125 mg Oral BID WC  . chlorthalidone  25 mg Oral Daily  . clopidogrel  75 mg Oral q morning  . DULoxetine  30 mg Oral Daily  . ezetimibe  10 mg Oral Daily  . gabapentin  100 mg Oral TID  . insulin aspart  0-9 Units Subcutaneous TID WC  . [START ON 10/02/2020] isosorbide mononitrate  120 mg Oral Daily  . pantoprazole  40 mg Oral Daily  . polyethylene glycol  17 g Oral Daily  . rivaroxaban  20 mg Oral Q supper  . spironolactone  25 mg Oral Daily  . sucralfate  1 g Oral TID WC & HS   Continuous Infusions:  PRN Meds: melatonin, morphine injection, nitroGLYCERIN  Allergies:    Allergies  Allergen Reactions  . 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 Aspirin Swelling  . Lisinopril Anaphylaxis  . Angiotensin Receptor Blockers     Never taken but PMH of angioedema with ACE-I ( Lisinopril)  . Heparin Other (See Comments)    Reaction:  Bleeding     Social History:   Social History   Socioeconomic History  . Marital status: Single    Spouse name: Not on file  . Number of children: Not on file  . Years of education: Not on file  . Highest education level: Not on file  Occupational History  . Occupation: retired  Tobacco Use  . Smoking status: Former Smoker    Packs/day: 1.00    Years: 30.00    Pack years: 30.00    Types: Cigarettes  . Smokeless  tobacco: Never Used  . Tobacco comment: 30 pack-year history. Quit 8-9 years ago.   Vaping Use  . Vaping Use: Never used  Substance and Sexual Activity  . Alcohol use: Not Currently  . Drug use: Never  . Sexual activity: Not on file  Other Topics Concern  . Not on file  Social History Narrative   ** Merged History Encounter **       Social Determinants of Health   Financial Resource Strain: Not on file  Food Insecurity: Not on file  Transportation Needs: Not on file  Physical Activity: Not on file  Stress: Not on file  Social Connections: Not on file  Intimate Partner Violence: Not on file    Family History:    Family History  Problem Relation Age of Onset  . Heart disease Mother 28       Deceased  . CVA Mother   . Seizures Father 75       Deceased from complications with seizures     ROS:   Constitutional: Denied fever, chills, malaise, night sweats Eyes: Denied vision change or loss Ears/Nose/Mouth/Throat: Denied ear ache, sore throat, coughing, sinus pain Cardiovascular: see HPI  Respiratory: see HPI  Gastrointestinal: Denied nausea, vomiting, abdominal pain, diarrhea Genital/Urinary: Denied dysuria, hematuria, urinary frequency/urgency Musculoskeletal: chronic back pain Skin: Denied rash, wound Neuro: positional lightheadedness   Psych: history of depression/anxiety  Endocrine: history of diabetes  Physical Exam/Data:   Vitals:   10/01/20 0400 10/01/20 0546 10/01/20 0914 10/01/20 1158  BP: (!) 167/90 (!) 158/79 121/83 125/69  Pulse: 70 100 71 72  Resp: 16 17 18 20   Temp: 98.2 F (36.8 C) 97.7 F (36.5 C) 98 F (36.7 C) 97.6 F (36.4 C)  TempSrc: Axillary  Oral Oral Oral  SpO2: 98%  100% 97%  Weight:      Height:        Intake/Output Summary (Last 24 hours) at 10/01/2020 1405 Last data filed at 10/01/2020 0600 Gross per 24 hour  Intake 360 ml  Output 1400 ml  Net -1040 ml   Last 3 Weights 09/30/2020 09/26/2020 09/25/2020  Weight (lbs) 178 lb  177 lb 3.2 oz 175 lb 11.2 oz  Weight (kg) 80.74 kg 80.377 kg 79.697 kg     Body mass index is 24.14 kg/m.   Vitals:  Vitals:   10/01/20 0914 10/01/20 1158  BP: 121/83 125/69  Pulse: 71 72  Resp: 18 20  Temp: 98 F (36.7 C) 97.6 F (36.4 C)  SpO2: 100% 97%   General Appearance: In no apparent distress, laying in bed HEENT: Normocephalic, atraumatic. EOMs intact. Sclera anicteric. Oropharynx is clear, tongue midline. Neck: Supple, trachea midline, no JVDs Cardiovascular: Regular rate and rhythm, normal S1-S2,  no murmur/rub/gallop Respiratory: Resting breathing unlabored, lungs sounds clear to auscultation bilaterally, no use of accessory muscles. On room air.  No wheezes, rales or rhonchi.   Gastrointestinal: Bowel sounds positive, abdomen soft, non-tender, non-distended. No mass or organomegaly.  Extremities: Able to move all extremities in bed without difficulty, no edema/cyanosis/clubbing Genitourinary: Urine clear yellow in urinal Musculoskeletal: Normal muscle bulk and tone, no limited range of motion, no swollen or erythematous joints Skin: Intact, warm, dry. Minor improving bruise noted of arm and neck area  Neurologic: Alert, oriented to person, place and time. Fluent speech, no facial droop, no cognitive deficit, no gross focal neuro deficit Psychiatric: Normal affect. Mood is appropriate.   EKG:  The EKG was personally reviewed and demonstrates:  Sinus rhythm with ventricular rate of 66bpm, old inferior infarct, no acute / specific  ST-T changes.   Telemetry:  Telemetry was personally reviewed and demonstrates:  Sinus rhythm with rate of 70s, first degree AV block, occasional PVCs, artifacts noted.   Relevant CV Studies:  Echo from 09/04/2020:  1. Left ventricular ejection fraction, by estimation, is 70 to 75%. The  left ventricle has hyperdynamic function. Left ventricular endocardial  border not optimally defined to evaluate regional wall motion. Left  ventricular  diastolic parameters are  consistent with Grade I diastolic dysfunction (impaired relaxation).  2. Right ventricular systolic function is normal. The right ventricular  size is normal.  3. The mitral valve is grossly normal. Trivial mitral valve  regurgitation. No evidence of mitral stenosis.  4. The aortic valve is tricuspid. There is mild calcification of the  aortic valve. There is mild thickening of the aortic valve. Aortic valve  regurgitation is not visualized. Mild to moderate aortic valve  sclerosis/calcification is present, without any  evidence of aortic stenosis.  5. The inferior vena cava is normal in size with greater than 50%  respiratory variability, suggesting right atrial pressure of 3 mmHg.    LHC on 07/13/2020:   Prox RCA to Dist RCA lesion is 100% stenosed.  Dist Graft lesion is 95% stenosed.  Ost LM to Mid LM lesion is 40% stenosed.  Ost Cx to Prox Cx lesion is 80% stenosed.  Mid Graft lesion is 20% stenosed.  Mid Cx lesion is 100% stenosed.  RPDA lesion is 90% stenosed.  Origin lesion is 100% stenosed.  The left ventricular ejection fraction is greater than 65% by visual estimate.  LV end diastolic pressure is normal.  The left ventricular systolic function is normal.  There  is no aortic valve stenosis.  Conclusions: 1. Multivessel coronary artery and bypass graft disease, as detailed in Dr. Donivan Scull diagnostic catheterization report.  Culprit lesion appears to be 95% stenosis in SVG-RCA. 2. Successful PCI to SVG-RCA using Resolute Onyx 3.5 x 18 mm drug-eluting stent with 0% residual stenosis and TIMI-3 flow.  Recommendations: 1. Remove right femoral artery sheath 2 hours after discontinuation of bivalirudin. 2. Restart heparin 8 hours after sheath removal.  Transition to rivaroxaban tomorrow if no evidence of bleeding/vascular complication. 3. Continue clopidogrel for 12 months (defer aspirin in the setting of aspirin allergy on chronic  anticoagulation with rivaroxaban). 4. Aggressive secondary prevention.    Laboratory Data:  High Sensitivity Troponin:   Recent Labs  Lab 09/18/20 0135 09/30/20 1326 09/30/20 1526 10/01/20 0650 10/01/20 0836  TROPONINIHS 8 12 10 15 14      Chemistry Recent Labs  Lab 09/25/20 0140 09/30/20 1326 10/01/20 0239  NA 132* 133* 133*  K 3.5 3.6 3.8  CL 102 101 104  CO2 23 21* 18*  GLUCOSE 233* 153* 100*  BUN 17 12 11   CREATININE 1.11 1.04 1.12  CALCIUM 9.1 9.3 8.9  GFRNONAA >60 >60 >60  ANIONGAP 7 11 11     Recent Labs  Lab 10/01/20 0239  PROT 6.1*  ALBUMIN 3.3*  AST 18  ALT 20  ALKPHOS 91  BILITOT 2.6*   Hematology Recent Labs  Lab 09/25/20 0140 09/30/20 1326 10/01/20 0239  WBC 6.6 9.6 10.2  RBC 4.54 4.57 4.48  HGB 15.1 15.0 15.0  HCT 42.5 43.1 43.3  MCV 93.6 94.3 96.7  MCH 33.3 32.8 33.5  MCHC 35.5 34.8 34.6  RDW 13.3 13.5 13.7  PLT 160 254 241   BNPNo results for input(s): BNP, PROBNP in the last 168 hours.  DDimer No results for input(s): DDIMER in the last 168 hours.   Radiology/Studies:  DG Chest Portable 1 View  Result Date: 09/30/2020 CLINICAL DATA:  Chest pain and shortness of breath. EXAM: PORTABLE CHEST 1 VIEW COMPARISON:  Chest x-ray dated 09/17/2020. FINDINGS: Heart size and mediastinal contours are stable. Median sternotomy wires appear intact and stable in alignment. Lungs are clear. No pleural effusion or pneumothorax is seen. IMPRESSION: No active disease. No evidence of pneumonia or pulmonary edema. Electronically Signed   By: Franki Cabot M.D.   On: 09/30/2020 15:22     Assessment and Plan:   Chest pain CAD with hx of CABG 2006 and PCI to [place] 07/13/2020  - presented with left sided chest pain radiating to left shoulder and arm, non-exertional, associated with SOB, diaphoresis, nausea and vomiting on 09/30/20 while sitting in wheelchair. Pain improved with Nitro gtt at ED and then worsened, now improving with PRN morphine, but not  resolved.  - Hs Trop negative x3. EKG showed sinus rhythm, non-specific ST-T wave abnormalities when comparing to old EKG. Telemetry without acute arrhythmia.  Last Echo from 09/04/20 with EF 70-75%, grade I DD.  -patient is complaint with Coreg, Plavix, Statin, Zetia, and Imdur,  (allergic to ASA).  Noted Plavix was interrupted during last hospitalization for 48 hours before EGD.  - no clinically significant evidence of ischemia given negative trop and non-specific EKG changes, recommend optimize medical therapy and pain control, no LHC indicated at this time  - will increase antianginal including Imdur to 120mg  daily, up-tritiate Coreg if BP allows   Paroxsymal A fib - currently in SR +/- first degree AV block  - CHADSVAS score 5 (age, HTN, CAD, DM),  continue Xarelto  - continue coreg  Hypertension - BP fairly controlled on current meds including coreg, chlorthalidone, spironolactone, and Imdur , changes made as above   Gastroparesis  GERD -ongoing GI symptoms for N/V/D, abdominal pain since Feb 2022, now resolved  -MRCP from 09/04/2020 unremarkable -CTA AP from 09/18/20 without acute intra-abdominal process  -EGD from 09/20/20 unremarkable - On PPI/carafate per GI  Type 2 DM - Hgb A1C 6.9% from 09/18/20, fairly controlled at goal  - home med metformin and jardiance currently held, agree with insulin SSI in house  - [facility] diet   HLD - LDL 29 on 08/08/20 , at goal - continue statin and zetia   Chronic pain syndrome Spinal stenosis  - on chronic Morphine ER for back pain per PDMP review      Risk Assessment/Risk Scores:   HEAR Score (for undifferentiated chest pain): 6     CHA2DS2-VASc Score = 5  This indicates a 7.2% annual risk of stroke. The patient's score is based upon: CHF History: No HTN History: Yes Diabetes History: Yes Stroke History: No Vascular Disease History: Yes Age Score: 2 Gender Score: 0      For questions or updates, please contact [place] Please consult [email or web address] for contact info under    Signed, Margie Billet, NP  10/01/2020 2:05 PM   History and all data above reviewed.  The patient typically lives by himself.  He has been staying at rehab since his last hospitalization.  He is bearing weight and taking a few steps with a walker it sounds like.  He was sitting watching the fish when he developed chest discomfort.  This was to the left of his sternum.  He said it was a severe 8 out of 10 discomfort.  He did describe some shoulder discomfort.  He thought it was like the pain he had back before his bypass.  EMS was called and he said he was diaphoretic and nauseated.  He got nitroglycerin he said without improvement.  He said IV nitroglycerin may have helped in the emergency room and morphine has helped for short while.  He says he is having 8 out of 10 discomfort now but is in no distress.  He said that he had not been having any nitroglycerin or need for that since he got out of the hospital over the last couple of weeks.  He is otherwise not been having any nausea vomiting or diarrhea.   EKG demonstrates no acute ST segment or T wave changes that are diagnostic.  Troponins have been negative this admission.  Patient examined.  I agree with the findings as above.  The patient exam reveals COR: Regular rate and rhythm,  Lungs: Clear to auscultation bilaterally,  Abd: Positive bowel sounds normal in frequency in pitch, Ext no edema.  All available labs, radiology testing, previous records reviewed. Agree with documented assessment and plan.  Chest pain: The patient has no objective evidence of ischemia.  His pain to me has more nonanginal than anginal qualities.  Its been an almost unrelenting discomfort yet his troponins high-sensitivity are negative.  I think in the absence of objective evidence of ischemia I would not suggest invasive testing.  I did review his cardiac cath films.  He may have some small vessel disease  contributing or this might be nonanginal pain.  He certainly can have a more maximal medical therapy and we will increase his beta-blocker and his nitrates.  Continue other therapy as listed. Minus Breeding  4:39 PM  10/01/2020

## 2020-10-01 NOTE — Progress Notes (Addendum)
Text page Dr. Nancy Fetter to review this a.m. EKG. Butch Penny R.N. aware and saw EKG.

## 2020-10-01 NOTE — Progress Notes (Signed)
Dr. Chauncey Reading return text page and will be up to see patient shortly.

## 2020-10-01 NOTE — Progress Notes (Addendum)
Family Medicine Teaching Service Daily Progress Note Intern Pager: [phone]  Patient name: Bryan Scott Medical record number: [id number] Date of birth: [birth year] Age: 75 y.o. Gender: male  Primary Care Provider: [facility] Consultants: none Code Status: Full  Pt Overview and Major Events to Date:  4/17: Admitted   Assessment and Plan: Bryan Scott is a 75 y.o. male presenting with left sided chest pain. PMH is significant for atrial fibrillation, GERD, CAD s/p CABG and coronary stent placement, hypertension, hyperlipidemia, type 2 DM and spinal stenosis.   Left-sided chest pain  ACS r/o CXR unremarkable, no active disease without evidence of pneumonia or pulmonary edema. EKG on admission notable for possible ST elevation in inferior lead with otherwise normal sinus rhythm. Troponin 12>10.  EKG this morning noted to have an ST elevation with t-wave inversion.  -cardiology consulted, appreciate recommendations  -cardiac telemetry  -tylenol 650 mg q4h -IV morphine 2mg  q4h prn for severe pain -consider CTA chest if respiratory status worsens or develops abnormal VS -up with assistance  -PT/OT eval and treat - VS per floor protocol   History of recurrent falls  Patient has history of recurrent falls that he endorsed multiple falls. Today he denies any recent falls since last admission.  -up with assistance -PT/OT eval and treat  Hyponatremia  Stable. Na 133. Possibly may be patient's baseline as it is likely a chronic issue per chart review. -monitor BMP  Hypertension BP 158/79 this morning. Home medications include coreg 3.125 mg bid daily, chlorthalidone 25 mg daily and imdur 30 mg daily. -continue home coreg -continue home chlorthalidone  -started home imdur  -monitor BP   Type 2 DM CBG 95 this morning. Most recent A1c 6.9. Home medications include jardiance 25 mg daily and metformin 500 mg bid daily.  -hold home metformin -hold home  jardiance -sSSI -monitor CBG  Hyperlipidemia Lipid panel from 08/08/2020 unremarkable. Home meds include atorvastatin 80 mg daily and zetia 10 mg daily. -continue home atorvastatin  -continue home zetia  Gastroparesis  Possible intra-abdominal process Previous MRCP 3/23 unremarkable. During last admission, GI consulted and performed EGD which was also unremarkable. Discharged on protonix 40 mg, sucralfate 1 g qid and reglan 10 mg tid but patient only notes to be taking protonix and carafate as previously prescribed during last hospitalization.  -continue home protonix -continue carafate  Paroxysmal atrial fibrillation EKG notable for normal sinus rhythm. Currently rate controlled at HR 66.  Home medications include xarelto and coreg. 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 Echo in 08/2020 notable for EF 70-75% along with Grade 1 diastolic dysfunction. Home meds include plavix 75 mg.  -continue home plavix   Chronic pain syndrome  Spinal stenosis  Endorses chronic back pain during this admission that is not new pain. Previous lumbar x-ray demonstrated diffuse degenerative disc and facet disease without acute bony abnormality. Home medications include oxycodone 5 mg. -hold home oxycodone  GERD Home medications include protonix 40 mg daily. -continue home protonix   Constipation -miralax to avoid opioid-induced constipation   FEN/GI: heart healthy/carb modified diet  PPx: xarelto (full dose anticoagulation)   Status is: Observation  The patient remains OBS appropriate and will d/c before 2 midnights.  Dispo: The patient is from: SNF              Anticipated d/c is to: SNF  Patient currently is not medically stable to d/c.   Difficult to place patient No        Subjective:  No acute overnight events reported. Patient denies dyspnea, headaches,  dizziness, vision changes but complaining of improved chest pain now at a 5/10 with morphine.   Objective: Temp:  [97.5 F (36.4 C)-98.2 F (36.8 C)] 97.7 F (36.5 C) (04/18 0546) Pulse Rate:  [64-100] 100 (04/18 0546) Resp:  [16-29] 17 (04/18 0546) BP: (90-167)/(62-94) 158/79 (04/18 0546) SpO2:  [95 %-99 %] 98 % (04/18 0400) Weight:  [80.7 kg] 80.7 kg (04/17 1315) Physical Exam: General: Patient laying upright in bed, in no acute distress. Cardiovascular: RRR, no murmurs or gallops auscultated bilaterally  Respiratory: CTAB, breathing comfortably on room air Abdomen: soft, nontender, BS+ Extremities: radial and distal pulses strong and equal bilaterally, no LE edema noted bilaterally Neuro: alert, follows all commands, appropriately conversational  Psych: mood appropriate, very pleasant   Laboratory: Recent Labs  Lab 09/25/20 0140 09/30/20 1326 10/01/20 0239  WBC 6.6 9.6 10.2  HGB 15.1 15.0 15.0  HCT 42.5 43.1 43.3  PLT 160 254 241   Recent Labs  Lab 09/25/20 0140 09/30/20 1326 10/01/20 0239  NA 132* 133* 133*  K 3.5 3.6 3.8  CL 102 101 104  CO2 23 21* 18*  BUN 17 12 11   CREATININE 1.11 1.04 1.12  CALCIUM 9.1 9.3 8.9  PROT  --   --  6.1*  BILITOT  --   --  2.6*  ALKPHOS  --   --  91  ALT  --   --  20  AST  --   --  18  GLUCOSE 233* 153* 100*      Imaging/Diagnostic Tests: DG Chest Portable 1 View  Result Date: 09/30/2020 CLINICAL DATA:  Chest pain and shortness of breath. EXAM: PORTABLE CHEST 1 VIEW COMPARISON:  Chest x-ray dated 09/17/2020. FINDINGS: Heart size and mediastinal contours are stable. Median sternotomy wires appear intact and stable in alignment. Lungs are clear. No pleural effusion or pneumothorax is seen. IMPRESSION: No active disease. No evidence of pneumonia or pulmonary edema. Electronically Signed   By: Franki Cabot M.D.   On: 09/30/2020 15:22    Donney Dice, DO 10/01/2020, 6:41 AM PGY-1, [facility] Intern  pager: [phone], text pages welcome

## 2020-10-01 NOTE — Evaluation (Signed)
Physical Therapy Evaluation Patient Details Name: Bryan Scott MRN: [id number] DOB: [birth year] Today's Date: 10/01/2020   History of Present Illness  75 y.o. male presenting with left sided chest pain. PMH is significant for atrial fibrillation, GERD, CAD s/p CABG and coronary stent placement, hypertension, hyperlipidemia, type 2 DM and spinal stenosis; recent admission 09/17/20 with lacerations on his chin and right UE following a fall in his bathroom after a syncopal episode.  Clinical Impression  Pt admitted with/for CP.  Continues to be limited in mobility due to pain, weakness and fatigue.  Pt needing min assist to light mod assist of one caregiver..  Pt currently limited functionally due to the problems listed. ( See problems list.)   Pt will benefit from PT to maximize function and safety in order to get ready for next venue listed below.     Follow Up Recommendations SNF    Equipment Recommendations  Rolling walker with 5" wheels    Recommendations for Other Services       Precautions / Restrictions Precautions Precautions: Fall Precaution Comments: chest/back pain      Mobility  Bed Mobility Overal bed mobility: Needs Assistance       Supine to sit: Min assist     General bed mobility comments: painful; Min assist to transition trunk upright    Transfers Overall transfer level: Needs assistance   Transfers: Sit to/from Stand Sit to Stand: Min assist;+2 safety/equipment;+2 physical assistance         General transfer comment: both forward and boost assist  Ambulation/Gait Ambulation/Gait assistance: Min assist Gait Distance (Feet): 3 Feet (sidestepping to the left up toward HOB, R for 3 feet and back toward [facility] for and additional 3 feet.) Assistive device: 1 person hand held assist;2 person hand held assist Gait Pattern/deviations: Step-to pattern Gait velocity: decreased Gait velocity interpretation: <1.31 ft/sec, indicative of household ambulator General  Gait Details: cues for posture, stability assist.  Pt declined moving away from the bed and therefore felt no need to get the RW.  Stairs            Wheelchair Mobility    Modified Rankin (Stroke Patients Only)       Balance Overall balance assessment: History of Falls;Needs assistance Sitting-balance support: Feet supported;No upper extremity supported Sitting balance-Leahy Scale: Fair       Standing balance-Leahy Scale: Poor Standing balance comment: reliant on UE support                             Pertinent Vitals/Pain Pain Assessment: 0-10 Pain Score: 7  Pain Location: back and chest Pain Descriptors / Indicators: Aching;Discomfort;Grimacing;Guarding;Moaning Pain Intervention(s): Limited activity within patient's tolerance;Monitored during session    Home Living Family/patient expects to be discharged to:: Skilled nursing facility                      Prior Function Level of Independence: Needs assistance   Gait / Transfers Assistance Needed: Min A RW - was walking @ 30 ft with chair follow at SNF  ADL's / Homemaking Assistance Needed: Assitance from staff with bathing/dressing  Comments: prior to recent admission - lived independently with sisters checking on him @ 3x/wk; drove     Hand Dominance   Dominant Hand: Right    Extremity/Trunk Assessment   Upper Extremity Assessment Upper Extremity Assessment: Generalized weakness    Lower Extremity Assessment Lower Extremity Assessment: Generalized weakness  Cervical / Trunk Assessment Cervical / Trunk Assessment: Kyphotic Cervical / Trunk Exceptions: hx of chronic back pain  Communication      Cognition Arousal/Alertness: Awake/alert Behavior During Therapy: WFL for tasks assessed/performed Overall Cognitive Status: No family/caregiver present to determine baseline cognitive functioning                     Current Attention Level: Selective       Awareness:  Emergent   General Comments: most likely baseline; joking with therapists      General Comments General comments (skin integrity, edema, etc.): vss on RA    Exercises     Assessment/Plan    PT Assessment Patient needs continued PT services  PT Problem List Decreased strength;Decreased activity tolerance;Decreased balance;Decreased mobility       PT Treatment Interventions DME instruction;Gait training;Functional mobility training;Therapeutic activities;Balance training;Patient/family education    PT Goals (Current goals can be found in the Care Plan section)  Acute Rehab PT Goals Patient Stated Goal: return to [facility] for continued rehab PT Goal Formulation: With patient Time For Goal Achievement: 10/08/20 Potential to Achieve Goals: Good    Frequency Min 2X/week   Barriers to discharge        Co-evaluation PT/OT/SLP Co-Evaluation/Treatment: Yes             AM-PAC PT "6 Clicks" Mobility  Outcome Measure Help needed turning from your back to your side while in a flat bed without using bedrails?: A Little Help needed moving from lying on your back to sitting on the side of a flat bed without using bedrails?: A Little Help needed moving to and from a bed to a chair (including a wheelchair)?: A Little Help needed standing up from a chair using your arms (e.g., wheelchair or bedside chair)?: A Lot Help needed to walk in hospital room?: A Lot Help needed climbing 3-5 steps with a railing? : A Lot 6 Click Score: 15    End of Session   Activity Tolerance: Patient limited by pain;Patient tolerated treatment well Patient left: in bed;with call bell/phone within reach;with bed alarm set Nurse Communication: Mobility status PT Visit Diagnosis: Unsteadiness on feet (R26.81);Muscle weakness (generalized) (M62.81);History of falling (Z91.81);Pain Pain - Right/Left: Right Pain - part of body:  (flank)    Time: 3338-3291 PT Time Calculation (min) (ACUTE ONLY): 13  min   Charges:   PT Evaluation $PT Eval Low Complexity: 1 Low          10/01/2020  Ginger Carne., PT Acute Rehabilitation Services [phone]  (pager) [phone]  (office)  Tessie Fass Shainna Faux 10/01/2020, 1:32 PM

## 2020-10-02 LAB — GLUCOSE, CAPILLARY
Glucose-Capillary: 106 mg/dL — ABNORMAL HIGH (ref 70–99)
Glucose-Capillary: 137 mg/dL — ABNORMAL HIGH (ref 70–99)

## 2020-10-02 LAB — BASIC METABOLIC PANEL
Anion gap: 12 (ref 5–15)
BUN: 16 mg/dL (ref 8–23)
CO2: 19 mmol/L — ABNORMAL LOW (ref 22–32)
Calcium: 9.2 mg/dL (ref 8.9–10.3)
Chloride: 100 mmol/L (ref 98–111)
Creatinine, Ser: 1.11 mg/dL (ref 0.61–1.24)
GFR, Estimated: >60 mL/min (ref >60)
Glucose, Bld: 116 mg/dL — ABNORMAL HIGH (ref 70–99)
Potassium: 3.8 mmol/L (ref 3.5–5.1)
Sodium: 131 mmol/L — ABNORMAL LOW (ref 135–145)

## 2020-10-02 LAB — CBC
HCT: 46.4 % (ref 39.0–52.0)
Hemoglobin: 16 g/dL (ref 13.0–17.0)
MCH: 32.9 pg (ref 26.0–34.0)
MCHC: 34.5 g/dL (ref 30.0–36.0)
MCV: 95.5 fL (ref 80.0–100.0)
Platelets: 275 10*3/uL (ref 150–400)
RBC: 4.86 MIL/uL (ref 4.22–5.81)
RDW: 13.8 % (ref 11.5–15.5)
WBC: 10.6 10*3/uL — ABNORMAL HIGH (ref 4.0–10.5)
nRBC: 0 % (ref 0.0–0.2)

## 2020-10-02 LAB — TROPONIN I (HIGH SENSITIVITY)
Troponin I (High Sensitivity): 18 ng/L — ABNORMAL HIGH (ref <18)
Troponin I (High Sensitivity): 17 ng/L (ref <18)

## 2020-10-02 LAB — SARS CORONAVIRUS 2 (TAT 6-24 HRS): SARS Coronavirus 2: NEGATIVE

## 2020-10-02 LAB — MAGNESIUM: Magnesium: 1.8 mg/dL (ref 1.7–2.4)

## 2020-10-02 MED ORDER — OXYCODONE HCL 5 MG PO TABS
5.0000 mg | ORAL_TABLET | Freq: Four times a day (QID) | ORAL | Status: DC | PRN
  Administered 2020-10-02 – 2020-10-03 (×4): 5 mg via ORAL
  Filled 2020-10-02 (×5): qty 1

## 2020-10-02 NOTE — Hospital Course (Addendum)
Chest pain History of recurrent falls Hyponatremia Hypertension Type 2 DM Hyperlipidemia Gastroparesis Paroxysmal atrial fibrillation CAD 2/2 CABG with recent stent placement Chronic pain syndrome  Spinal stenosis GERD Constipation   Bryan Scott is a 75 y.o. male presenting with left sided chest pain. PMH is significant for atrial fibrillation, GERD, CAD s/p CABG and coronary stent placement, hypertension, hyperlipidemia, type 2 DM and spinal stenosis.    Left-sided chest pain Presented to the ED with chest pain that radiated to the jaw and down his left arm. CXR unremarkable, no active disease without evidence of pneumonia or pulmonary edema. EKG on admission notable for possible ST elevation in inferior lead with otherwise normal sinus rhythm. Troponins flat. Admitted for ACS rule out, cardiology consulted and did not feel any further intervention  ***  Hypertension  Moderately hypertensive on admission, noted to have normotensive soon after. Home medications coreg and chlorthalidone restarted. Home med imdur increased to 120 mg per cardiology***   All other issues chronic and stable.    Issues for follow up:  Follow up with cardiology outpatient on 11/02/2020, [place]. Repeat BMP, AKI noted shortly before discharge that improved. Also seems to have chronic hyponatremia. Monitor Na.

## 2020-10-02 NOTE — Progress Notes (Signed)
Physical Therapy Treatment Patient Details Name: Bryan Scott MRN: [id number] DOB: 07/21/1945 Today's Date: 10/02/2020    History of Present Illness 75 y.o. male presenting with left sided chest pain. Recent admission 09/17/20 with lacerations on his chin and right UE following a fall in his bathroom after a syncopal episode. PMH: Afib, GERD, CAD s/p CABG and coronary stent placement, HTN, HLD, DMII and spinal stenosis    PT Comments    Pt received in supine, agreeable to therapy session with encouragement, with fair participation and tolerance for mobility. Pt noted to have BP drop in stance (MAP (62)) and deferred gait training this session due to pt symptoms of lightheadedness/weakness in stance. He needed minA for functional mobility tasks and increased time and effort to initiate/perform tasks. Orthostatic BPs Supine 92/65 (74)  Supine after UE exercise 101/69 (80)  Sitting 95/66 (75)  Standing 91/50 (62) pt c/o fatigue/ lightheadedness  After return to Supine 95/69 (78)  Pt deferring seated exercise or chair transfer this date due to fatigue after poor sleep previous evening. Pt continues to benefit from PT services to progress toward functional mobility goals. Continue to recommend SNF.   Follow Up Recommendations  SNF     Equipment Recommendations  Rolling walker with 5" wheels    Recommendations for Other Services       Precautions / Restrictions Precautions Precautions: Fall Precaution Comments: chest/back pain Restrictions Weight Bearing Restrictions: No    Mobility  Bed Mobility Overal bed mobility: Needs Assistance Bed Mobility: Rolling;Sidelying to Sit;Sit to Supine Rolling: Supervision Sidelying to sit: Min assist   Sit to supine: Min guard   General bed mobility comments: cues for log rolling, pt needs minA for trunk rise; needs bed features/rail support to rise and return to supine    Transfers Overall transfer level: Needs assistance Equipment used:  Rolling walker (2 wheeled) Transfers: Sit to/from Stand Sit to Stand: Min assist;From elevated surface         General transfer comment: both forward and boost assist, cues for safe hand placement needed; encouraged pt to attempt more than once but he refused serial sit<>stands for strengthening  Ambulation/Gait Ambulation/Gait assistance: Min assist Gait Distance (Feet): 2 Feet Assistive device: Rolling walker (2 wheeled) Gait Pattern/deviations: Step-to pattern;Shuffle Gait velocity: decreased   General Gait Details: sidesteps toward HOB, small shuffled steps; low BP limiting pt standing tolerance and deferred further gait   Stairs             Wheelchair Mobility    Modified Rankin (Stroke Patients Only)       Balance Overall balance assessment: History of Falls;Needs assistance Sitting-balance support: Feet supported;No upper extremity supported Sitting balance-Leahy Scale: Fair     Standing balance support: Bilateral upper extremity supported Standing balance-Leahy Scale: Poor Standing balance comment: reliant on UE support        Cognition Arousal/Alertness: Awake/alert Behavior During Therapy: WFL for tasks assessed/performed;Flat affect Overall Cognitive Status: No family/caregiver present to determine baseline cognitive functioning Area of Impairment: Problem solving;Safety/judgement                   Current Attention Level: Selective     Safety/Judgement: Decreased awareness of safety Awareness: Emergent Problem Solving: Requires verbal cues;Slow processing General Comments: pt more flat this session initially and slow processing/responding to therapist questions, pt states "you speak too fast". After mobility completed, pt more calm and cooperative and thanked therapist for assisting      Exercises General Exercises -  Upper Extremity Shoulder Flexion: AROM;Right;10 reps;Supine (pt refusing on L after performing on R) Elbow Flexion:  AROM;Both;10 reps;Supine (shoulders flexed to 90*)    General Comments General comments (skin integrity, edema, etc.): see orthostatics above      Pertinent Vitals/Pain Pain Assessment: Faces Faces Pain Scale: Hurts little more Pain Location: back and under chin/L shoulder from previous fall Pain Descriptors / Indicators: Aching;Discomfort;Grimacing;Sore Pain Intervention(s): Limited activity within patient's tolerance;Monitored during session;Repositioned           PT Goals (current goals can now be found in the care plan section) Acute Rehab PT Goals Patient Stated Goal: return to [facility] for continued rehab PT Goal Formulation: With patient Time For Goal Achievement: 10/08/20 Potential to Achieve Goals: Good Progress towards PT goals: Progressing toward goals    Frequency    Min 2X/week      PT Plan Current plan remains appropriate       AM-PAC PT "6 Clicks" Mobility   Outcome Measure  Help needed turning from your back to your side while in a flat bed without using bedrails?: A Little Help needed moving from lying on your back to sitting on the side of a flat bed without using bedrails?: A Little Help needed moving to and from a bed to a chair (including a wheelchair)?: A Little Help needed standing up from a chair using your arms (e.g., wheelchair or bedside chair)?: A Lot Help needed to walk in hospital room?: A Lot Help needed climbing 3-5 steps with a railing? : Total 6 Click Score: 14    End of Session Equipment Utilized During Treatment: Gait belt Activity Tolerance: Patient limited by fatigue;Other (comment) (orthostatic symptoms in stance) Patient left: in bed;with call bell/phone within reach;with bed alarm set;Other (comment) (Pt requesting sign on door to keep it closed (loud hallway)) Nurse Communication: Mobility status;Other (comment) (BP drop in stance) PT Visit Diagnosis: Unsteadiness on feet (R26.81);Muscle weakness (generalized)  (M62.81);History of falling (Z91.81);Pain Pain - Right/Left: Right Pain - part of body:  (flank)     Time: 7106-2694 PT Time Calculation (min) (ACUTE ONLY): 20 min  Charges:  $Therapeutic Activity: 8-22 mins                     Jeweldean Drohan P., PTA Acute Rehabilitation Services Pager: [phone] Office: [place] 10/02/2020, 5:29 PM

## 2020-10-02 NOTE — TOC Initial Note (Signed)
Transition of Care [facility]) - Initial/Assessment Note    Patient Details  Name: Bryan Scott MRN: [id number] Date of Birth: 1945-10-25  Transition of Care [facility]) CM/SW Contact:    Trula Ore, [place] Phone Number: 10/02/2020, 2:18 PM  Clinical Narrative:                  CSW received consult for possible SNF placement at time of discharge. CSW spoke with patient at bedside regarding PT recommendation of SNF placement at time of discharge.Patient reported that he came from [facility] short term rehab.  Patient expressed understanding of PT recommendation and is agreeable to SNF placement at time of discharge. Patient reports preference for returning to [place] . Patient has received the COVID vaccines as well as booster. Patient gave CSW to discuss his care with his sister Thornton Park. CSW called and spoke with Perrin Smack with [facility] who confirmed that [facility] can accept patient for SNF placement when medically ready. No further questions reported at this time. CSW to continue to follow and assist with discharge planning needs.   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)                                      Prior Living Arrangements/Services Living arrangements for the past 2 months: [place] (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)   Patient language and need for interpreter reviewed:: Yes Do you feel safe going back to the place where  you live?: No   SNF  Need for Family Participation in Patient Care: Yes (Comment) Care giver support system in place?: Yes (comment)   Criminal Activity/Legal Involvement Pertinent to Current Situation/Hospitalization: No - Comment as needed  Activities of Daily Living Home Assistive Devices/Equipment: None ADL Screening (condition at time of admission) Patient's cognitive ability adequate to safely complete daily activities?: Yes Is the patient deaf or have difficulty hearing?: No Does the patient have difficulty seeing, even when wearing glasses/contacts?: No Does the patient have difficulty concentrating, remembering, or making decisions?: No Patient able to express need for assistance with ADLs?: Yes Does the patient have difficulty dressing or bathing?: No Independently performs ADLs?: Yes (appropriate for developmental age) Does the patient have difficulty walking or climbing stairs?: Yes (now but not before) Weakness of Legs: Both Weakness of Arms/Hands: Left  Permission Sought/Granted Permission sought to share information with : Case Manager,Family Chief Financial Officer Permission granted to share information with : Yes, Verbal Permission Granted  Share Information with NAME: Thornton Park  Permission granted to share info w AGENCY: SNF  Permission granted to share info w Relationship: sister  Permission granted to share info w Contact Information: [place] Park [phone]  Emotional Assessment Appearance:: Appears stated age Attitude/Demeanor/Rapport: Gracious Affect (typically observed): Calm Orientation: : Oriented to Self,Oriented to Place,Oriented to  Time,Oriented to Situation Alcohol / Substance Use: Not Applicable Psych Involvement: No (comment)  Admission diagnosis:  Chest pain [R07.9]  Chest pain, unspecified type [R07.9] Patient Active Problem List   Diagnosis Date Noted  . 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 Non-intractable vomiting    . Dehydration   . Fall   . Injury   . Malnutrition of moderate degree 09/19/2020  . Protein-calorie malnutrition ([place]) 09/18/2020  . Syncope 09/17/2020  . History of pancreatitis   . Cervical radiculopathy   . Acute pancreatitis 08/07/2020  . Hyperlipidemia associated with type 2 diabetes mellitus ([place]) 08/07/2020  . NSTEMI (non-ST elevated myocardial infarction) ([place]) 07/11/2020  . AKI (acute kidney injury) ([place]) 07/11/2020  . Acute metabolic encephalopathy 76/22/6333  . GERD (gastroesophageal reflux disease) 09/09/2019  . CAD (coronary artery disease) 09/09/2019  . Chronic pain syndrome 09/09/2019  . Leukocytosis 09/09/2019  . Shortness of breath   . Hyponatremia 09/26/2016  . Chest pain 09/22/2016  . 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  . Abnormal EKG 09/21/2016  . Chronic pain 09/21/2016  . PAF (paroxysmal atrial fibrillation) ([place]) 09/21/2016  . Essential hypertension 05/02/2016  . Carotid stenosis 05/02/2016  . CAD in native artery   . Atypical chest pain   . DM (diabetes mellitus), type 2 with peripheral vascular complications ([place])   . Hyperlipidemia   . Pain of left upper extremity   . Angina pectoris ([place])   . Abdominal pain   . Chest pain, central 03/13/2015  . Hypertensive urgency 03/13/2015  . Spinal stenosis 01/10/2012   PCP:  [facility], [place], [place] [address] [place] [address] Phone: [phone] Fax: [phone]  [place], [place] [address] Phone: [phone] Fax: [phone]     Social Determinants of Health (SDOH) Interventions    Readmission Risk Interventions No flowsheet data found.

## 2020-10-02 NOTE — Progress Notes (Addendum)
Progress Note  Patient Name: Bryan Scott Date of Encounter: 10/02/2020  [facility] Cardiologist: Nelva Bush, MD   Subjective   Patient states he had some chest pain this morning but morphine eased it. He states he had some nausea last night which improved with zofran. He agrees with no LHC and medical management at this time. He made a joke during encounter. He states he has a good understanding of his current clinical status and treatment plan.   Inpatient Medications    Scheduled Meds: . acetaminophen  650 mg Oral Q4H  . atorvastatin  80 mg Oral Daily  . carvedilol  6.25 mg Oral BID WC  . chlorthalidone  25 mg Oral Daily  . clopidogrel  75 mg Oral q morning  . DULoxetine  30 mg Oral Daily  . ezetimibe  10 mg Oral Daily  . gabapentin  100 mg Oral TID  . insulin aspart  0-9 Units Subcutaneous TID WC  . isosorbide mononitrate  120 mg Oral Daily  . pantoprazole  40 mg Oral Daily  . polyethylene glycol  17 g Oral Daily  . rivaroxaban  20 mg Oral Q supper  . spironolactone  25 mg Oral Daily  . sucralfate  1 g Oral TID WC & HS   Continuous Infusions:  PRN Meds: melatonin, morphine injection, nitroGLYCERIN, ondansetron   Vital Signs    Vitals:   10/01/20 2336 10/02/20 0359 10/02/20 0400 10/02/20 0740  BP: 131/71 (!) 164/91 138/76 129/77  Pulse: 90 100 70 70  Resp: 18 18 20 19   Temp: 97.7 F (36.5 C) 97.7 F (36.5 C)    TempSrc: Oral Oral  Oral  SpO2: 96% 97% 96% 95%  Weight:      Height:        Intake/Output Summary (Last 24 hours) at 10/02/2020 0810 Last data filed at 10/02/2020 0033 Gross per 24 hour  Intake 0 ml  Output 1900 ml  Net -1900 ml   Last 3 Weights 09/30/2020 09/26/2020 09/25/2020  Weight (lbs) 178 lb 177 lb 3.2 oz 175 lb 11.2 oz  Weight (kg) 80.74 kg 80.377 kg 79.697 kg      Telemetry    Sinus rhythm with rate of 60-70s, occasional PVCs, artifacts noted - Personally Reviewed  ECG    No new tracing  - Personally Reviewed  Physical  Exam   GEN: No acute distress. Eating breakfast   Neck: No JVD Cardiac: RRR, no murmurs, rubs, or gallops.  Respiratory: Clear to auscultation bilaterally. On room air GI: Soft, nontender, non-distended  MS: No BLE edema; No deformity. Neuro:  Alert and oriented x3, no cognitive deficit, no focal neuro deficit  Psych: Normal affect   Labs    High Sensitivity Troponin:   Recent Labs  Lab 09/18/20 0135 09/30/20 1326 09/30/20 1526 10/01/20 0650 10/01/20 0836  TROPONINIHS 8 12 10 15 14       Chemistry Recent Labs  Lab 09/30/20 1326 10/01/20 0239 10/02/20 0104  NA 133* 133* 131*  K 3.6 3.8 3.8  CL 101 104 100  CO2 21* 18* 19*  GLUCOSE 153* 100* 116*  BUN 12 11 16   CREATININE 1.04 1.12 1.11  CALCIUM 9.3 8.9 9.2  PROT  --  6.1*  --   ALBUMIN  --  3.3*  --   AST  --  18  --   ALT  --  20  --   ALKPHOS  --  91  --   BILITOT  --  2.6*  --   GFRNONAA >60 >60 >60  ANIONGAP 11 11 12      Hematology Recent Labs  Lab 09/30/20 1326 10/01/20 0239 10/02/20 0104  WBC 9.6 10.2 10.6*  RBC 4.57 4.48 4.86  HGB 15.0 15.0 16.0  HCT 43.1 43.3 46.4  MCV 94.3 96.7 95.5  MCH 32.8 33.5 32.9  MCHC 34.8 34.6 34.5  RDW 13.5 13.7 13.8  PLT 254 241 275    BNPNo results for input(s): BNP, PROBNP in the last 168 hours.   DDimer No results for input(s): DDIMER in the last 168 hours.   Radiology    DG Chest Portable 1 View  Result Date: 09/30/2020 CLINICAL DATA:  Chest pain and shortness of breath. EXAM: PORTABLE CHEST 1 VIEW COMPARISON:  Chest x-ray dated 09/17/2020. FINDINGS: Heart size and mediastinal contours are stable. Median sternotomy wires appear intact and stable in alignment. Lungs are clear. No pleural effusion or pneumothorax is seen. IMPRESSION: No active disease. No evidence of pneumonia or pulmonary edema. Electronically Signed   By: Franki Cabot M.D.   On: 09/30/2020 15:22    Cardiac Studies   Echo from 09/04/2020:  1. Left ventricular ejection fraction, by  estimation, is 70 to 75%. The  left ventricle has hyperdynamic function. Left ventricular endocardial  border not optimally defined to evaluate regional wall motion. Left  ventricular diastolic parameters are  consistent with Grade I diastolic dysfunction (impaired relaxation).  2. Right ventricular systolic function is normal. The right ventricular  size is normal.  3. The mitral valve is grossly normal. Trivial mitral valve  regurgitation. No evidence of mitral stenosis.  4. The aortic valve is tricuspid. There is mild calcification of the  aortic valve. There is mild thickening of the aortic valve. Aortic valve  regurgitation is not visualized. Mild to moderate aortic valve  sclerosis/calcification is present, without any  evidence of aortic stenosis.  5. The inferior vena cava is normal in size with greater than 50%  respiratory variability, suggesting right atrial pressure of 3 mmHg.    LHC on 07/13/2020:   Prox RCA to Dist RCA lesion is 100% stenosed.  Dist Graft lesion is 95% stenosed.  Ost LM to Mid LM lesion is 40% stenosed.  Ost Cx to Prox Cx lesion is 80% stenosed.  Mid Graft lesion is 20% stenosed.  Mid Cx lesion is 100% stenosed.  RPDA lesion is 90% stenosed.  Origin lesion is 100% stenosed.  The left ventricular ejection fraction is greater than 65% by visual estimate.  LV end diastolic pressure is normal.  The left ventricular systolic function is normal.  There is no aortic valve stenosis.  Conclusions: 1. Multivessel coronary artery and bypass graft disease, as detailed in Dr. Donivan Scull diagnostic catheterization report. Culprit lesion appears to be 95% stenosis in SVG-RCA. 2. Successful PCI to SVG-RCA using Resolute Onyx 3.5 x 18 mm drug-eluting stent with 0% residual stenosis and TIMI-3 flow.  Recommendations: 1. Remove right femoral artery sheath 2 hours after discontinuation of bivalirudin. 2. Restart heparin 8 hours after sheath  removal. Transition to rivaroxaban tomorrow if no evidence of bleeding/vascular complication. 3. Continue clopidogrel for 12 months (defer aspirin in the setting of aspirin allergy on chronic anticoagulation with rivaroxaban). 4. Aggressive secondary prevention.    Patient Profile     Bryan Scott a 75 y.o.malewith a PMH of CAD s/p CABG in 2006and PCI with DES to SVG-RCAon1/28/2022, Paroxsymal A fib(on Xarelto), type 2 DM, hypertension, hyperlipidemia, carotid artery stenosis, cervical  spinal stenosis s/p C5-C7 ACDF, chronic pain syndrome, hx of tobacco use, who presented to [facility]/30[facility]20 for left sided chest pain. Cardiology is consulted for chest pain by hospitalist Dr Tawanna Solo.   Assessment & Plan    Chest pain CAD with hx of CABG 2006 and PCI to [place] 07/13/2020  - presented with left sided chest pain radiating to left shoulder and arm, non-exertional, associated with SOB, diaphoresis, nausea and vomiting on 09/30/20 while sitting in wheelchair. Pain improved then worsened while on the Nitro gtt,overall improving with PRN morphine  -Hs Trop negative x3. EKG showed sinus rhythm, non-specific ST-T wave abnormalities whencomparing to old EKG. Telemetry without acute arrhythmia. Last Echo from 09/04/20 with EF 70-75%, grade I DD.  -patient is complaint with Coreg, Plavix, Statin, Zetia, and Imdur,  (allergic to ASA).  Noted Plavix was interrupted during last hospitalization for 48 hours before EGD.  - no clinically significant evidence of ischemia given negative trop and non-specific EKG changes, he appears comfortable, querying non-cardiac chest pain component,  recommend optimize medical therapy and pain control, no LHC indicated at this time  - increased antianginal regimen including Imdur to 120mg  daily, Coreg to 6.25mg  BID  Paroxsymal A fib - currently in SR +/- first degree AV block  - CHADSVAS score 5 (age, HTN, CAD, DM), continue Xarelto  - continue  coreg  Hypertension - BP fairly controlled on current meds including coreg, chlorthalidone, spironolactone, and Imdur , changes made as above   Hyponatremia - Na downtrend, may need discontinue thiazide diuretic, defer to primary team   Gastroparesis  GERD -ongoing GI symptoms for N/V/D, abdominal pain since Feb 2022, now resolved  -MRCP from 09/04/2020 unremarkable -CTA AP from 09/18/20 without acute intra-abdominal process  -EGD from 09/20/20 unremarkable - On PPI/carafate per GI  Type 2 DM - Hgb A1C 6.9% from 09/18/20, fairly controlled at goal  - home med metformin and jardiance currently held, agree with insulin SSI in house  - [facility] diet   HLD - LDL 29 on 08/08/20 , at goal - continue statin and zetia  Chronic pain syndrome Spinal stenosis  - on chronic Morphine ER for back pain per PDMP review     Follow up with cardiology outpatient appointment has been made for 10/18/2020   For questions or updates, please contact [facility] Please consult [email or web address] for contact info under        Signed, Margie Billet, NP  10/02/2020, 8:10 AM    History and all data above reviewed.  Patient examined.  I agree with the findings as above. He reports 8/10 pain but looks comfortable watching TV.   The pain is left upper chest/axilla The patient exam reveals COR:RRR, no rub  ,  Lungs: Clear  ,  Abd: Positive bowel sounds, no rebound no guarding, Ext No edema  .  All available labs, radiology testing, previous records reviewed. Agree with documented assessment and plan.   Chest pain:  Non anginal.  No further cardiac work up.  We increased beta blocker and nitrates given his underlying disease although I do not think that this is an ACS.    Jeneen Rinks [facility]  10:43 AM  10/02/2020

## 2020-10-02 NOTE — Progress Notes (Addendum)
Family Medicine Teaching Service Daily Progress Note Intern Pager: [phone]  Patient name: Bryan Scott Medical record number: [id number] Date of birth: [birth year] Age: 75 y.o. Gender: male  Primary Care Provider: [facility], [place] Consultants: cardiology  Code Status: Full   Pt Overview and Major Events to Date:  4/17: Admitted   Assessment and Plan: Bryan Scott a 75 y.o.malepresenting with left sidedchest pain. PMH is significant foratrial fibrillation, GERD, CAD s/p CABGand coronary stent placement,hypertension, hyperlipidemia, type 2 DM and spinal stenosis.  Left-sided chest pain Still present, rates pain 6/10 this morning although sitting in bed comfortably. CXR unremarkable, no active disease without evidence of pneumoniaor pulmonary edema. EKG on admissionnotable for possible ST elevation in inferior lead with otherwise normal sinus rhythm. Troponin 12>10.  Per cardiology, patient has not objective evidence of ischemia.  -cardiology consulted, appreciate recommendations include to increase imdur to 120 mg daily and uptitrate coreg if BP allows -cardiac telemetry  -tylenol 650 mg q4h -discontinued morphine, added back home oxy -consider CTA chest if respiratory status worsens ordevelops abnormal VS -up with assistance  -PT/OT eval and treat - VS per floor protocol  History of recurrent falls  Patient has history of recurrent fallsthat he endorsed multiple falls but none recently leading up to this admission. -up with assistance -PT/OT eval and treat  Hyponatremia  Stable. Na 131.Possibly may be patient's baseline as it is likely a chronic issue per chart review. -monitor BMP  Hypertension BP 138/76 this morning. Home medications include coreg 3.125 mg bid daily, chlorthalidone 25 mg dailyand imdur 30 mg daily. -continue home coreg -continue home chlorthalidone  -imdur increased to 120 mg, per cards -monitor BP   Type 2 DM CBG 106 this  morning. Most recent A1c 6.9. Home medications include jardiance 25 mg daily and metformin 500 mg bid daily.  -hold home metformin -hold home jardiance -sSSI -monitor CBG  Hyperlipidemia Lipid panel from 08/08/2020 unremarkable. Home meds include atorvastatin 80 mg daily and zetia 10 mg daily. -continue home atorvastatin  -continue home zetia  Gastroparesis  Possible intra-abdominal process Previous MRCP 3/23unremarkable. During last admission, GI consulted and performed EGD which was also unremarkable. Discharged on protonix 40 mg, sucralfate 1 g qid and reglan 10 mg tid but patient only notes to be taking protonix and carafate as previously prescribed during last hospitalization.  -continue home protonix -continue carafate  Paroxysmal atrial fibrillation EKG notable fornormal sinus rhythm.Currently rate controlled at HR 70.Home medications include xarelto and coreg. 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 Echo in 08/2020 notable for EF 70-75% along with Grade 1 diastolic dysfunction. Home meds include plavix 75 mg.  -continue home plavix  Chronic pain syndrome  Spinal stenosis Endorses chronic back pain during this admission that is not new pain. Previous lumbar x-ray demonstrated diffuse degenerative disc and facet disease without acute bony abnormality. Home medications include oxycodone 5 mg. - oxycodone  GERD Home medications include protonix 40 mg daily. -continue home protonix  Constipation -miralax to avoid opioid-induced constipation  FEN/GI: heart healthy/carb modified  PPx: xarelto (full dose anticoagulation)    Status is: Inpatient  Remains inpatient appropriate because:Ongoing active pain requiring inpatient pain management   Dispo: The patient is from: SNF              Anticipated d/c is to: SNF  Patient currently is not  medically stable to d/c.   Difficult to place patient No        Subjective:  Overnight endorsed nausea, given zofran which resolved. Endorsing 6/10 chest pain that seems to be persisting since admission. Denies any other concerns at this time. Patient states that he spoke with his SNF who said they will save a bed for him so he can return there after discharge.   Objective: Temp:  [97.3 F (36.3 C)-98 F (36.7 C)] 97.7 F (36.5 C) (04/19 0359) Pulse Rate:  [70-100] 70 (04/19 0400) Resp:  [17-20] 20 (04/19 0400) BP: (121-164)/(64-91) 138/76 (04/19 0400) SpO2:  [96 %-100 %] 96 % (04/19 0400) Physical Exam: General: Patient sitting upright in bed, in no acute distress. Cardiovascular: RRR, no murmurs or gallops auscultated  Respiratory: CTAB, breathing comfortably on room air  Abdomen: soft, nontender, BS+ Extremities: radial and distal pulses bilaterally, no LE edema noted bilaterally  Neuro: alert and appropriately conversational  Psych: mood appropriate, pleasant   Laboratory: Recent Labs  Lab 09/30/20 1326 10/01/20 0239 10/02/20 0104  WBC 9.6 10.2 10.6*  HGB 15.0 15.0 16.0  HCT 43.1 43.3 46.4  PLT 254 241 275   Recent Labs  Lab 09/30/20 1326 10/01/20 0239 10/02/20 0104  NA 133* 133* 131*  K 3.6 3.8 3.8  CL 101 104 100  CO2 21* 18* 19*  BUN 12 11 16   CREATININE 1.04 1.12 1.11  CALCIUM 9.3 8.9 9.2  PROT  --  6.1*  --   BILITOT  --  2.6*  --   ALKPHOS  --  91  --   ALT  --  20  --   AST  --  18  --   GLUCOSE 153* 100* 116*      Imaging/Diagnostic Tests: No results found.  Donney Dice, DO 10/02/2020, 7:35 AM PGY-1, [place] Intern pager: [phone], text pages welcome

## 2020-10-02 NOTE — Progress Notes (Signed)
FPTS Interim Progress Note  S: Notified by nurse that patient experiencing dizziness and appeared diaphoretic with BP 100/70. Vitals stable during this event. Went to bedside to assess patient, he said that when his symptoms began he was in the bed. No exertional component or position changes. Still endorsing chest pain.   O: BP 105/71   Pulse 75   Temp 97.6 F (36.4 C) (Oral)   Resp 20   Ht 6' (1.829 m)   Wt 80.7 kg   SpO2 96%   BMI 24.14 kg/m   General: Patient laying in bed comfortably, in no acute distress and no diaphoresis noted. CV: RRR, no murmurs or gallops auscultated Resp: CTAB, breathing comfortably on room air MSK: no tenderness on palpation on chest wall  Ext: radial and distal pulses strong and equal bilaterally, no LE edema noted  Neuro: AOx4, appropriately conversational Psych: mood appropriate  A/P: -Although ongoing chest pain and given non-reproducible on palpation, cannot eliminate cardiac etiology. Vitals remain stable and patient's symptoms have resolved which is reassuring.  -pending EKG -trend troponins  Donney Dice, DO 10/02/2020, 12:13 PM PGY-1, [place] Medicine Service pager [phone]

## 2020-10-02 NOTE — TOC Progression Note (Signed)
Transition of Care [facility]) - Progression Note    Patient Details  Name: Bryan Scott MRN: [id number] Date of Birth: 1945-06-29  Transition of Care [facility]) CM/SW [place], [place] Phone Number: 10/02/2020, 2:42 PM  Clinical Narrative:     CSW started insurance authorization for patient. Requested start date is for 4/20. Reference number is # [id number]. CSW faxed over clinicals for review. Patient has SNF bed at [facility]. Insurance authorization is pending. CSW will continue to follow and assist with discharge planning needs.    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

## 2020-10-03 ENCOUNTER — Ambulatory Visit: Payer: No Typology Code available for payment source | Admitting: Nurse Practitioner

## 2020-10-03 DIAGNOSIS — I952 Hypotension due to drugs: Secondary | ICD-10-CM

## 2020-10-03 LAB — CBC
HCT: 48.7 % (ref 39.0–52.0)
Hemoglobin: 16.4 g/dL (ref 13.0–17.0)
MCH: 32.9 pg (ref 26.0–34.0)
MCHC: 33.7 g/dL (ref 30.0–36.0)
MCV: 97.6 fL (ref 80.0–100.0)
Platelets: 303 10*3/uL (ref 150–400)
RBC: 4.99 MIL/uL (ref 4.22–5.81)
RDW: 13.9 % (ref 11.5–15.5)
WBC: 10.6 10*3/uL — ABNORMAL HIGH (ref 4.0–10.5)
nRBC: 0 % (ref 0.0–0.2)

## 2020-10-03 LAB — BASIC METABOLIC PANEL
Anion gap: 19 — ABNORMAL HIGH (ref 5–15)
BUN: 22 mg/dL (ref 8–23)
CO2: 15 mmol/L — ABNORMAL LOW (ref 22–32)
Calcium: 9.4 mg/dL (ref 8.9–10.3)
Chloride: 99 mmol/L (ref 98–111)
Creatinine, Ser: 1.44 mg/dL — ABNORMAL HIGH (ref 0.61–1.24)
GFR, Estimated: 51 mL/min — ABNORMAL LOW (ref >60)
Glucose, Bld: 138 mg/dL — ABNORMAL HIGH (ref 70–99)
Potassium: 4 mmol/L (ref 3.5–5.1)
Sodium: 133 mmol/L — ABNORMAL LOW (ref 135–145)

## 2020-10-03 LAB — GLUCOSE, CAPILLARY
Glucose-Capillary: 128 mg/dL — ABNORMAL HIGH (ref 70–99)
Glucose-Capillary: 157 mg/dL — ABNORMAL HIGH (ref 70–99)

## 2020-10-03 MED ORDER — RIVAROXABAN 15 MG PO TABS
15.0000 mg | ORAL_TABLET | Freq: Every day | ORAL | Status: DC
  Administered 2020-10-03 – 2020-10-04 (×2): 15 mg via ORAL
  Filled 2020-10-03 (×2): qty 1

## 2020-10-03 MED ORDER — ISOSORBIDE MONONITRATE ER 60 MG PO TB24
60.0000 mg | ORAL_TABLET | Freq: Every day | ORAL | Status: DC
  Administered 2020-10-04: 60 mg via ORAL
  Filled 2020-10-03: qty 1

## 2020-10-03 MED ORDER — OXYCODONE HCL 5 MG PO TABS
2.5000 mg | ORAL_TABLET | Freq: Four times a day (QID) | ORAL | Status: AC | PRN
  Administered 2020-10-03 – 2020-10-04 (×3): 2.5 mg via ORAL
  Filled 2020-10-03 (×4): qty 1

## 2020-10-03 NOTE — Progress Notes (Signed)
Family Medicine Teaching Service Daily Progress Note Intern Pager: [phone]  Patient name: Bryan Scott Medical record number: [id number] Date of birth: 1945/11/01 Age: 75 y.o. Gender: male  Primary Care Provider: [facility] [place] Consultants: cardiology Code Status: Full  Pt Overview and Major Events to Date:  4/17: Admitted   Assessment and Plan: QUAMERE MUSSELL a 75 y.o.malepresenting with left sidedchest pain. PMH is significant foratrial fibrillation, GERD, CAD s/p CABGand coronary stent placement,hypertension, hyperlipidemia, type 2 DM and spinal stenosis.  Left-sided chest pain Still present, rates pain 6/10 this morning although sitting in bed comfortably. CXR unremarkable, no active disease without evidence of pneumoniaor pulmonary edema. EKG on admissionnotable for possible ST elevation in inferior lead with otherwise normal sinus rhythm. Troponin 12>10.Per cardiology, patient has not objective evidence of ischemia.  -cardiology consulted, appreciate recommendationsinclude to increase imdur to 120 mg daily and uptitrate coreg if BP allows -cardiac telemetry -tylenol 650 mg q4h -oxycodone 2.5 mg q6h prn -consider CTA chest if respiratory status worsens ordevelops abnormal VS -up with assistance  -PT/OT eval and treat - VS per floor protocol  AKI Cr 1.44, baseline appears to be 0.9-1.12. Possibly prerenal. -chlorthalidone discontinued per cards, monitor and consider IVF if worsens   -monitor Cr  History of recurrent falls  Patient has history of recurrent fallsthat he endorsed multiple falls but none recently leading up to this admission. -up with assistance -PT/OT eval and treat  Hyponatremia Stable.Na 133.Possibly may be patient's baseline as it is likely a chronic issue per chart review. -monitorBMP  Hypertension BP 140/76 this morning.Home medications include coreg 3.125 mg bid daily, chlorthalidone 25 mg dailyand imdur 30 mg  daily. -coreg increased to 6.25 mg bid, per cards -discontinued chlorthalidone, per cards -imdur increased to 120 mg, per cards -monitor BP   Type 2 DM CBG 128 this morning.Most recent A1c 6.9. Home medications include jardiance 25 mg daily and metformin 500 mg bid daily.  -hold home metformin -hold home jardiance -sSSI -monitor CBG  Hyperlipidemia Lipid panel from 08/08/2020 unremarkable. Home meds include atorvastatin 80 mg daily and zetia 10 mg daily. -continue home atorvastatin  -continue home zetia  Gastroparesis  Possible intra-abdominal process Previous MRCP 3/23unremarkable. During last admission, GI consulted and performed EGD which was also unremarkable. Discharged on protonix 40 mg, sucralfate 1 g qid and reglan 10 mg tid but patient only notes to be taking protonix and carafate as previously prescribed during last hospitalization.  -continue home protonix -continue carafate  Paroxysmal atrial fibrillation EKG notable fornormal sinus rhythm.Currently rate controlled at HR76.Home medications include xarelto and coreg. 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 Echo in 08/2020 notable for EF 70-75% along with Grade 1 diastolic dysfunction. Home meds include plavix 75 mg.  -continue home plavix  Chronic pain syndrome  Spinal stenosis Endorses chronic back pain during this admission that is not new pain. Previous lumbar x-ray demonstrated diffuse degenerative disc and facet disease without acute bony abnormality. Home medications include oxycodone 5 mg. - oxycodone  GERD Home medications include protonix 40 mg daily. -continue home protonix  Constipation -miralax to avoid opioid-induced constipation  FEN/GI: heart healthy/carb modified  PPx: xarelto (full dose anticoagulation)    Status is: Inpatient  Remains inpatient appropriate  because:Ongoing active pain requiring inpatient pain management   Dispo: The patient is from: SNF  Anticipated d/c is to: SNF              Patient currently is not medically stable to d/c.   Difficult to place patient No        Subjective:  No acute overnight events reported. Still endorsing 6/10 chest pain. Denies dyspnea, dizziness and diaphoresis.   Objective: Temp:  [97.5 F (36.4 C)-98 F (36.7 C)] 97.6 F (36.4 C) (04/20 0433) Pulse Rate:  [70-81] 76 (04/20 0433) Resp:  [17-20] 20 (04/20 0433) BP: (88-140)/(60-77) 140/76 (04/20 0433) SpO2:  [94 %-98 %] 96 % (04/20 0433) Weight:  [77 kg] 77 kg (04/20 0433) Physical Exam: General: Patient sitting upright in bed, in no acute distress. Cardiovascular: RRR, no murmurs or gallops auscultated  Respiratory: CTAB, no rales or rhonchi noted Abdomen: soft, nontender, nondistended, BS+ Extremities: radial and distal pulses strong and equal bilaterally, no LE edema noted Neuro: alert and appropriately conversational Psych: mood appropriate   Laboratory: Recent Labs  Lab 10/01/20 0239 10/02/20 0104 10/03/20 0135  WBC 10.2 10.6* 10.6*  HGB 15.0 16.0 16.4  HCT 43.3 46.4 48.7  PLT 241 275 303   Recent Labs  Lab 10/01/20 0239 10/02/20 0104 10/03/20 0135  NA 133* 131* 133*  K 3.8 3.8 4.0  CL 104 100 99  CO2 18* 19* 15*  BUN 11 16 22   CREATININE 1.12 1.11 1.44*  CALCIUM 8.9 9.2 9.4  PROT 6.1*  --   --   BILITOT 2.6*  --   --   ALKPHOS 91  --   --   ALT 20  --   --   AST 18  --   --   GLUCOSE 100* 116* 138*      Imaging/Diagnostic Tests: No results found.  Donney Dice, DO 10/03/2020, 7:04 AM PGY-1, [place] Intern pager: [phone], text pages welcome

## 2020-10-03 NOTE — Progress Notes (Signed)
Occupational Therapy Treatment Patient Details Name: Bryan Scott MRN: [id number] DOB: 1945-10-15 Today's Date: 10/03/2020    History of present illness 75 y.o. male presenting with left sided chest pain. Recent admission 09/17/20 with lacerations on his chin and right UE following a fall in his bathroom after a syncopal episode. PMH: Afib, GERD, CAD s/p CABG and coronary stent placement, HTN, HLD, DMII and spinal stenosis   OT comments  Pt making gradual progress towards OT goals this session. Session focus on increased pain, hypotension ( see vitals below) and decreased activity tolerance. Pt currently requires min guard assist for bed mobility with pt able to completed below therex in an effort to increase low BP, deferred standing/ BADL participation d/t increased back pain  and low BP. Pt would continue to benefit from skilled occupational therapy while admitted and after d/c to address the below listed limitations in order to improve overall functional mobility and facilitate independence with BADL participation. DC plan remains appropriate, will follow acutely per POC.   Supine- 87/64 ( 73) Supine after therex in bed- 107/70(81) EOB- 97/82 ( 88) EOB after seated therex- 106/74 ( 81) Supine- 136/80 ( 95)   Follow Up Recommendations  SNF;Supervision/Assistance - 24 hour    Equipment Recommendations  None recommended by OT    Recommendations for Other Services      Precautions / Restrictions Precautions Precautions: Fall Precaution Comments: chest/back pain Restrictions Weight Bearing Restrictions: No       Mobility Bed Mobility Overal bed mobility: Needs Assistance Bed Mobility: Supine to Sit;Sit to Supine     Supine to sit: Min guard;HOB elevated Sit to supine: Min guard;HOB elevated   General bed mobility comments: min guard for line mgmt    Transfers                 General transfer comment: deferred standing d/t BP    Balance Overall balance assessment:  History of Falls;Needs assistance Sitting-balance support: Feet supported;No upper extremity supported Sitting balance-Leahy Scale: Fair Sitting balance - Comments: sitting EOB with no LOB for therex       Standing balance comment: unable to stand d/t BP and increased pain                           ADL either performed or assessed with clinical judgement   ADL Overall ADL's : Needs assistance/impaired                           Toilet Transfer Details (indicate cue type and reason): pt declined d/t dizziness and drop in BP with mobility         Functional mobility during ADLs: Min guard (bed mobility only) General ADL Comments: pt continues to present with hypotension, decreased activity tolerance and increased pain     Vision       Perception     Praxis      Cognition Arousal/Alertness: Awake/alert Behavior During Therapy: WFL for tasks assessed/performed;Flat affect;Agitated (moments of mild agitation) Overall Cognitive Status: No family/caregiver present to determine baseline cognitive functioning Area of Impairment: Problem solving;Safety/judgement;Attention;Following commands;Awareness                   Current Attention Level: Selective   Following Commands: Follows one step commands consistently;Follows multi-step commands with increased time (benefits from one step commands as pt often telling COTA "you talk too much") Safety/Judgement: Decreased awareness of  safety Awareness: Emergent Problem Solving: Requires verbal cues;Slow processing;Decreased initiation General Comments: pt upset about doctor adjusting his meds mildly agitated at times asking COTA not to talk so much.        Exercises General Exercises - Lower Extremity Ankle Circles/Pumps: Both;Supine Long Arc Quad: AROM;Both;10 reps;Seated Hip ABduction/ADduction: AROM;Both;10 reps;Supine Hip Flexion/Marching: AROM;Both;10 reps;Seated   Shoulder Instructions        General Comments bruising on back R side, see vitals in OT comments    Pertinent Vitals/ Pain       Pain Assessment: Faces Faces Pain Scale: Hurts little more Pain Location: back Pain Descriptors / Indicators: Aching;Discomfort;Grimacing;Sore Pain Intervention(s): Limited activity within patient's tolerance;Monitored during session;Repositioned  Home Living                                          Prior Functioning/Environment              Frequency  Min 2X/week        Progress Toward Goals  OT Goals(current goals can now be found in the care plan section)  Progress towards OT goals: Not progressing toward goals - comment (limited by BP and increased pain)  Acute Rehab OT Goals Patient Stated Goal: return to [facility] for continued rehab OT Goal Formulation: With patient Time For Goal Achievement: 10/15/20 Potential to Achieve Goals: Good  Plan Discharge plan remains appropriate;Frequency remains appropriate    Co-evaluation                 AM-PAC OT "6 Clicks" Daily Activity     Outcome Measure   Help from another person eating meals?: None Help from another person taking care of personal grooming?: A Little Help from another person toileting, which includes using toliet, bedpan, or urinal?: A Lot Help from another person bathing (including washing, rinsing, drying)?: A Lot Help from another person to put on and taking off regular upper body clothing?: A Lot Help from another person to put on and taking off regular lower body clothing?: Total 6 Click Score: 14    End of Session    OT Visit Diagnosis: Unsteadiness on feet (R26.81);Other abnormalities of gait and mobility (R26.89);Muscle weakness (generalized) (M62.81);History of falling (Z91.81);Pain Pain - part of body:  (back)   Activity Tolerance Other (comment) (limited by BP and increased pain)   Patient Left in bed;with call bell/phone within reach   Nurse Communication  Mobility status        Time: 2993-7169 OT Time Calculation (min): 22 min  Charges: OT General Charges $OT Visit: 1 Visit OT Treatments $Therapeutic Activity: 8-22 mins  Harley Alto., COTA/L Acute Rehabilitation Services [phone]    Precious Haws 10/03/2020, 2:55 PM

## 2020-10-03 NOTE — TOC Progression Note (Signed)
Transition of Care [facility]) - Progression Note    Patient Details  Name: Bryan Scott MRN: [id number] Date of Birth: 1945/10/13  Transition of Care [facility]) CM/SW [place], [place] Phone Number: 10/03/2020, 2:50 PM  Clinical Narrative:     received insurance auth reference # [id number] auth # [id number] from 04/20-4/22 CSW informed SNF per DO, anticipate d/c tomorrow -CSW advised another covid test is not needed.  CSW will continue to follow and assist with discharge planning.  Thurmond Butts, MSW, LCSW Clinical Social Worker   Expected Discharge Plan: Skilled Nursing Facility Barriers to Discharge: Continued Medical Work up  Expected Discharge Plan and Services Expected Discharge Plan: [place] In-house Referral: Clinical Social Work     Living arrangements for the past 2 months: [place] (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

## 2020-10-03 NOTE — TOC Progression Note (Signed)
Transition of Care [facility]) - Progression Note    Patient Details  Name: Bryan Scott MRN: [id number] Date of Birth: [birth year]  Transition of Care [facility]) CM/SW [place], [place] Phone Number: 10/03/2020, 11:54 AM  Clinical Narrative:     Insurance remains pending   Thurmond Butts, MSW, LCSW Clinical Social Worker   Expected Discharge Plan: Skilled Nursing Facility Barriers to Discharge: Continued Medical Work up  Expected Discharge Plan and Services Expected Discharge Plan: [place] In-house Referral: Clinical Social Work     Living arrangements for the past 2 months: [place] (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

## 2020-10-03 NOTE — Progress Notes (Signed)
Pt refused vitals and meds at this time- requesting to sleep. Will reassess at later time.

## 2020-10-03 NOTE — Progress Notes (Addendum)
Progress Note  Patient Name: Bryan Scott Date of Encounter: 10/03/2020  [facility] Cardiologist: Nelva Bush, MD   Subjective   Patient states he is feeling better today, chest pain is improving, felt oxycodone is helping somewhat. He reports his BP was down to 80s yesterday. He states he is eating minimally due to poor appetitie. He is tolerate fluids. He is urinating without difficulties.   Inpatient Medications    Scheduled Meds: . acetaminophen  650 mg Oral Q4H  . atorvastatin  80 mg Oral Daily  . carvedilol  6.25 mg Oral BID WC  . clopidogrel  75 mg Oral q morning  . DULoxetine  30 mg Oral Daily  . ezetimibe  10 mg Oral Daily  . gabapentin  100 mg Oral TID  . isosorbide mononitrate  120 mg Oral Daily  . pantoprazole  40 mg Oral Daily  . polyethylene glycol  17 g Oral Daily  . rivaroxaban  20 mg Oral Q supper  . spironolactone  25 mg Oral Daily  . sucralfate  1 g Oral TID WC & HS   Continuous Infusions:  PRN Meds: melatonin, nitroGLYCERIN, ondansetron, oxyCODONE   Vital Signs    Vitals:   10/02/20 2200 10/02/20 2342 10/03/20 0433 10/03/20 0745  BP: 122/75 131/71 140/76 109/70  Pulse: 74 78 76 81  Resp: 18 18 20 17   Temp:  97.8 F (36.6 C) 97.6 F (36.4 C) (!) 97.4 F (36.3 C)  TempSrc:  Oral Oral Oral  SpO2: 96% 95% 96% 97%  Weight:   77 kg   Height:        Intake/Output Summary (Last 24 hours) at 10/03/2020 0907 Last data filed at 10/03/2020 0745 Gross per 24 hour  Intake 1222 ml  Output 1000 ml  Net 222 ml   Last 3 Weights 10/03/2020 09/30/2020 09/26/2020  Weight (lbs) 169 lb 12.1 oz 178 lb 177 lb 3.2 oz  Weight (kg) 77 kg 80.74 kg 80.377 kg      Telemetry    Sinus rhythm with rate of 70s, occasional PVC - Personally Reviewed  ECG    EKG repeated yesterday 4/18 with resolved TWI of lateral leads comparing to EKG from 09/25/20, non-specific changes, No new tracing today   - Personally Reviewed  Physical Exam   GEN:No acute distress.  Laying in bed   Neck:No JVD Cardiac:RRR, no murmurs, rubs, or gallops.  Respiratory:Clear to auscultation bilaterally. On room air IZ:TIWP, nontender, non-distended  MS:No BLE edema; No deformity. Neuro:Alert and oriented x3, no cognitive deficit, no focal neuro deficit  Psych: Normal affect   Labs    High Sensitivity Troponin:   Recent Labs  Lab 09/30/20 1526 10/01/20 0650 10/01/20 0836 10/02/20 1238 10/02/20 1456  TROPONINIHS 10 15 14  18* 17      Chemistry Recent Labs  Lab 10/01/20 0239 10/02/20 0104 10/03/20 0135  NA 133* 131* 133*  K 3.8 3.8 4.0  CL 104 100 99  CO2 18* 19* 15*  GLUCOSE 100* 116* 138*  BUN 11 16 22   CREATININE 1.12 1.11 1.44*  CALCIUM 8.9 9.2 9.4  PROT 6.1*  --   --   ALBUMIN 3.3*  --   --   AST 18  --   --   ALT 20  --   --   ALKPHOS 91  --   --   BILITOT 2.6*  --   --   GFRNONAA >60 >60 51*  ANIONGAP 11 12 19*  Hematology Recent Labs  Lab 10/01/20 0239 10/02/20 0104 10/03/20 0135  WBC 10.2 10.6* 10.6*  RBC 4.48 4.86 4.99  HGB 15.0 16.0 16.4  HCT 43.3 46.4 48.7  MCV 96.7 95.5 97.6  MCH 33.5 32.9 32.9  MCHC 34.6 34.5 33.7  RDW 13.7 13.8 13.9  PLT 241 275 303    BNPNo results for input(s): BNP, PROBNP in the last 168 hours.   DDimer No results for input(s): DDIMER in the last 168 hours.   Radiology    No results found.  Cardiac Studies   Echo from 09/04/2020:  1. Left ventricular ejection fraction, by estimation, is 70 to 75%. The  left ventricle has hyperdynamic function. Left ventricular endocardial  border not optimally defined to evaluate regional wall motion. Left  ventricular diastolic parameters are  consistent with Grade I diastolic dysfunction (impaired relaxation).  2. Right ventricular systolic function is normal. The right ventricular  size is normal.  3. The mitral valve is grossly normal. Trivial mitral valve  regurgitation. No evidence of mitral stenosis.  4. The aortic valve is  tricuspid. There is mild calcification of the  aortic valve. There is mild thickening of the aortic valve. Aortic valve  regurgitation is not visualized. Mild to moderate aortic valve  sclerosis/calcification is present, without any  evidence of aortic stenosis.  5. The inferior vena cava is normal in size with greater than 50%  respiratory variability, suggesting right atrial pressure of 3 mmHg.    LHC on 07/13/2020:   Prox RCA to Dist RCA lesion is 100% stenosed.  Dist Graft lesion is 95% stenosed.  Ost LM to Mid LM lesion is 40% stenosed.  Ost Cx to Prox Cx lesion is 80% stenosed.  Mid Graft lesion is 20% stenosed.  Mid Cx lesion is 100% stenosed.  RPDA lesion is 90% stenosed.  Origin lesion is 100% stenosed.  The left ventricular ejection fraction is greater than 65% by visual estimate.  LV end diastolic pressure is normal.  The left ventricular systolic function is normal.  There is no aortic valve stenosis.  Conclusions: 1. Multivessel coronary artery and bypass graft disease, as detailed in Dr. Donivan Scull diagnostic catheterization report. Culprit lesion appears to be 95% stenosis in SVG-RCA. 2. Successful PCI to SVG-RCA using Resolute Onyx 3.5 x 18 mm drug-eluting stent with 0% residual stenosis and TIMI-3 flow.  Recommendations: 1. Remove right femoral artery sheath 2 hours after discontinuation of bivalirudin. 2. Restart heparin 8 hours after sheath removal. Transition to rivaroxaban tomorrow if no evidence of bleeding/vascular complication. 3. Continue clopidogrel for 12 months (defer aspirin in the setting of aspirin allergy on chronic anticoagulation with rivaroxaban). 4. Aggressive secondary prevention.   Patient Profile     Bryan Scott a 75 y.o.malewith a PMH of CAD s/p CABG in 2006and PCI with DES to SVG-RCAon1/28/2022, Paroxsymal A fib(on Xarelto), type 2 DM, hypertension, hyperlipidemia, carotid artery stenosis, cervical spinal  stenosis s/p C5-C7 ACDF, chronic pain syndrome, hx of tobacco use, [facility] for left sided chest pain. Cardiology is consulted for chest pain by hospitalist Dr Tawanna Solo.  Assessment & Plan    Chest pain  CAD with hx of CABG 2006 and PCItoSVG-RCA1/28/2022 - presented withleft sidedchest pain radiating to left shoulder and arm, non-exertional, associated with SOB, diaphoresis, nausea and vomiting on 09/30/20 while sitting in wheelchair. Pain improved then worsened while on the Nitro gtt, overall improving with PRN morphine and oxycodone  -Hs Trop negative x5. EKG showed sinus rhythm,non-specificST-T  wave abnormalities whencomparing tooldEKGs.Telemetry without acute arrhythmia.LastEcho from 09/04/20 with EF 70-75%, grade I DD.  -patient is complaintwith Coreg, Plavix, Statin, Zetia, and Imdur,(allergic to ASA).Noted Plavix was interrupted during last hospitalization for 48 hours before EGD. - no clinically significant evidence of acute ischemia given negative trop and non-specific EKG changes, he appears comfortable, querying non-cardiac chest pain component + small vessel disease,  recommend optimize medical therapy and pain control, no LHC indicated at this time  - increased antianginal regimen including Imdur to 120mg  daily, Coreg to 6.25mg  BID, tolerating   AKI - Cr 1.44 today, possible due to hypotension episode 88/71 -103/73 yesterday - BP stable today, will discontinue chlorthalidone which will allow [place] to continue uptitrated antianginals - trend renal index - CrCl <50 today, may renally dose Xarelto if Cr worsening - consider stopping spironolactone if this persists  Paroxsymal A fib - currently in SR+/- first degree AV block - CHADSVAS score 5 (age, HTN, CAD, DM), continue Xarelto(see above regarding following renal function dosing) - continue coreg  Hypertension - BP low yesterday and improved today. Currently on coreg, chlorthalidone,  spironolactone, and Imdur, will discontinue chlorthalidone today   Hyponatremia - mild, improved, management per primary team   Gastroparesis  GERD -ongoing GI symptomsfor N/V/D, abdominal pain since Feb 2022, now resolved -MRCP from 09/04/2020 unremarkable -CTA AP from 09/18/20 without acute intra-abdominal process  -EGD from 09/20/20 unremarkable - On PPI/carafate per GI  Type 2 DM - Hgb A1C 6.9%from 09/18/20, fairly controlled at goal  -home medmetforminandjardiancecurrently held, agree with insulin SSI in house  - [facility] diet  HLD - LDL 29 on 08/08/20 , at goal - continue statin and zetia  Chronic pain syndrome Spinal stenosis  - on chronic Morphine ER for back pain per PDMP review    Follow up with cardiology outpatient appointment has been made for 10/18/2020   For questions or updates, please contact [facility] Please consult [email or web address] for contact info under       Signed, Margie Billet, NP  10/03/2020, 9:07 AM    History and all data above reviewed.  Patient examined.  I agree with the findings as above.  He does not feel well when his BP is dropping.  He says that his pain is resolving.    The patient exam reveals COR:RRR  ,  Lungs: Clear  ,  Abd: Positive bowel sounds, no rebound no guarding, Ext No edema  .  All available labs, radiology testing, previous records reviewed. Agree with documented assessment and plan.   HYPOTENSION:  Holding the chlorthalidone and I will reduce the Imdur back down to 60 mg daily.  Continue other therapy.  CHEST PAIN:  Still without EKG changes or trop elevation to suggest an acute coronary syndrome.  No further ischemia work up.  Call Mr. Toma Copier with the results and send results to [place]  12:36 PM  10/03/2020

## 2020-10-04 DIAGNOSIS — R072 Precordial pain: Secondary | ICD-10-CM

## 2020-10-04 LAB — BASIC METABOLIC PANEL WITH GFR
Anion gap: 15 (ref 5–15)
BUN: 25 mg/dL — ABNORMAL HIGH (ref 8–23)
CO2: 18 mmol/L — ABNORMAL LOW (ref 22–32)
Calcium: 9.2 mg/dL (ref 8.9–10.3)
Chloride: 98 mmol/L (ref 98–111)
Creatinine, Ser: 1.34 mg/dL — ABNORMAL HIGH (ref 0.61–1.24)
GFR, Estimated: 55 mL/min — ABNORMAL LOW
Glucose, Bld: 138 mg/dL — ABNORMAL HIGH (ref 70–99)
Potassium: 3.4 mmol/L — ABNORMAL LOW (ref 3.5–5.1)
Sodium: 131 mmol/L — ABNORMAL LOW (ref 135–145)

## 2020-10-04 LAB — CBC
HCT: 47 % (ref 39.0–52.0)
Hemoglobin: 16.4 g/dL (ref 13.0–17.0)
MCH: 33.3 pg (ref 26.0–34.0)
MCHC: 34.9 g/dL (ref 30.0–36.0)
MCV: 95.3 fL (ref 80.0–100.0)
Platelets: 264 K/uL (ref 150–400)
RBC: 4.93 MIL/uL (ref 4.22–5.81)
RDW: 13.7 % (ref 11.5–15.5)
WBC: 10 K/uL (ref 4.0–10.5)
nRBC: 0 % (ref 0.0–0.2)

## 2020-10-04 LAB — GLUCOSE, CAPILLARY
Glucose-Capillary: 160 mg/dL — ABNORMAL HIGH (ref 70–99)
Glucose-Capillary: 164 mg/dL — ABNORMAL HIGH (ref 70–99)

## 2020-10-04 MED ORDER — OXYCODONE HCL 5 MG PO TABS: 2.5000 mg | ORAL_TABLET | Freq: Two times a day (BID) | ORAL | 0 refills | Status: AC

## 2020-10-04 MED ORDER — CARVEDILOL 6.25 MG PO TABS: 6.2500 mg | ORAL_TABLET | Freq: Two times a day (BID) | ORAL | 0 refills | Status: DC

## 2020-10-04 MED ORDER — OXYCODONE HCL 5 MG PO TABS: 2.5000 mg | ORAL_TABLET | ORAL | Status: DC | PRN

## 2020-10-04 MED ORDER — ISOSORBIDE MONONITRATE ER 60 MG PO TB24: 60.0000 mg | ORAL_TABLET | Freq: Every day | ORAL | 0 refills | Status: DC

## 2020-10-04 NOTE — Progress Notes (Signed)
Called report about pt to the RN of the [place] facility.

## 2020-10-04 NOTE — Progress Notes (Signed)
Family Medicine Teaching Service Daily Progress Note Intern Pager: [phone]  Patient name: Bryan Scott Medical record number: [id number] Date of birth: 26-Feb-1946 Age: 75 y.o. Gender: male  Primary Care Provider: [facility] Consultants: cardiology  Code Status: Full  Pt Overview and Major Events to Date:  4/17: Admitted   Assessment and Plan: Bryan Scott a 75 y.o.malepresenting with left sidedchest pain. PMH is significant foratrial fibrillation, GERD, CAD s/p CABGand coronary stent placement,hypertension, hyperlipidemia, type 2 DM and spinal stenosis.  Left-sided chest pain Significantly improved chest pain, now rating it 2/10.CXR unremarkable, no active disease without evidence of pneumoniaor pulmonary edema. EKG on admissionnotable for possible ST elevation in inferior lead with otherwise normal sinus rhythm. Troponin 12>10.Per cardiology, patient has not objective evidence of ischemia. -cardiology following, no further ischemia work up  -cardiac telemetry -tylenol 650 mg q4h -oxycodone 2.5 mg q6h prn -consider CTA chest if respiratory status worsens ordevelops abnormal VS -up with assistance  -cardiology outpatient follow up   AKI Improving Cr 1.34 compared to Cr 1.44 yesterday, baseline appears to be 0.9-1.12. Possibly prerenal. -chlorthalidone discontinued per cards, monitor and consider IVF if worsens   -monitor Cr  History of recurrent falls  Patient has history of recurrent fallsthat he endorsed multiple fallsbut none recently leading up to this admission. -up with assistance -PT/OT eval and treat  Hyponatremia Stable.Na 133.Possibly may be patient's baseline as it is likely a chronic issue per chart review. -monitorBMP  Hypertension BP124/62this morning.Home medications include coreg 3.125 mg bid daily, chlorthalidone 25 mg dailyand imdur 30 mg daily. -coreg increased to 6.25 mg bid, per cards -discontinued  chlorthalidone, per cards -imdur increased to 120 mg, per cards -monitor BP   Type 2 DM CBG160this morning.Most recent A1c 6.9. Home medications include jardiance 25 mg daily and metformin 500 mg bid daily.  -hold home metformin -hold home jardiance -sSSI -monitor CBG  Hyperlipidemia Lipid panel from 08/08/2020 unremarkable. Home meds include atorvastatin 80 mg daily and zetia 10 mg daily. -continue home atorvastatin  -continue home zetia  Gastroparesis  Possible intra-abdominal process Previous MRCP 3/23unremarkable. During last admission, GI consulted and performed EGD which was also unremarkable. Discharged on protonix 40 mg, sucralfate 1 g qid and reglan 10 mg tid but patient only notes to be taking protonix and carafate as previously prescribed during last hospitalization.  -continue home protonix -continue carafate  Paroxysmal atrial fibrillation EKG notable fornormal sinus rhythm.Currently rate controlled at HR76.Home medications include xarelto and coreg. 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 Echo in 08/2020 notable for EF 70-75% along with Grade 1 diastolic dysfunction. Home meds include plavix 75 mg.  -continue home plavix  Chronic pain syndrome  Spinal stenosis Endorses chronic back pain during this admission that is not new pain. Previous lumbar x-ray demonstrated diffuse degenerative disc and facet disease without acute bony abnormality.  - oxycodone  GERD Home medications include protonix 40 mg daily. -continue home protonix  Constipation -miralax to avoid opioid-induced constipation  FEN/GI: heart healthy/carb modified PPx: xarelto (full dose anticoagulation)   Status is: Inpatient   Dispo: The patient is from: SNF              Anticipated d/c is to: SNF              Patient currently is not medically stable to d/c.   Difficult  to place patient No  Subjective: No overnight events reported. Denies dyspnea and chest pain. He is ready to go back to SNF today if he is able to.   Objective: Temp:  [97.3 F (36.3 C)-97.8 F (36.6 C)] 97.8 F (36.6 C) (04/21 0400) Pulse Rate:  [71-81] 71 (04/21 0400) Resp:  [17-19] 17 (04/21 0400) BP: (105-132)/(62-81) 124/62 (04/21 0400) SpO2:  [94 %-98 %] 95 % (04/21 0400) Physical Exam: General: Patient sitting on the side of the bed eating breakfast, in no acute distress. Cardiovascular: RRR, no murmurs or gallops auscultated  Respiratory: CTAB, no rales or rhonchi noted Abdomen: soft, nontender, nondistended, BS+ Extremities: radial and distal pulses strong and equal bilaterally, no LE edema noted bilaterally Neuro: appropriately conversationally, alert and follows all commands Psych: mood appropriate   Laboratory: Recent Labs  Lab 10/02/20 0104 10/03/20 0135 10/04/20 0215  WBC 10.6* 10.6* 10.0  HGB 16.0 16.4 16.4  HCT 46.4 48.7 47.0  PLT 275 303 264   Recent Labs  Lab 10/01/20 0239 10/02/20 0104 10/03/20 0135 10/04/20 0215  NA 133* 131* 133* 131*  K 3.8 3.8 4.0 3.4*  CL 104 100 99 98  CO2 18* 19* 15* 18*  BUN 11 16 22  25*  CREATININE 1.12 1.11 1.44* 1.34*  CALCIUM 8.9 9.2 9.4 9.2  PROT 6.1*  --   --   --   BILITOT 2.6*  --   --   --   ALKPHOS 91  --   --   --   ALT 20  --   --   --   AST 18  --   --   --   GLUCOSE 100* 116* 138* 138*      Imaging/Diagnostic Tests: No results found.  Donney Dice, DO 10/04/2020, 6:18 AM PGY-1, [place] Intern pager: [phone], text pages welcome

## 2020-10-04 NOTE — Progress Notes (Signed)
D/c tele and IVs. D/c paper works placed in pt's d/c envelope. Await for PTAR.   Lavenia Atlas, RN

## 2020-10-04 NOTE — Discharge Summary (Signed)
[facility] Discharge Summary  Patient name: Bryan Scott Medical record number: [id number] Date of birth: Dec 02, 1945 Age: 75 y.o. Gender: male Date of Admission: 09/30/2020  Date of Discharge: 10/04/2020 Admitting Physician: Ezequiel Essex, MD  Primary Care Provider: 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 Cardiovascular: RRR, no murmurs or gallops auscultated  Respiratory: CTAB, no rales or rhonchi noted Abdomen: soft, nontender, nondistended, BS+ Extremities: radial and distal pulses strong and equal bilaterally, no LE edema noted bilaterally Neuro: appropriately conversationally, alert and follows all commands Psych: mood appropriate   Brief Hospital Course:  Bryan Scott is a 75 y.o. male presenting with left sided chest pain. PMH is significant for atrial fibrillation, GERD, CAD s/p CABG and coronary stent placement, hypertension, hyperlipidemia, type 2 DM and spinal stenosis.    Left-sided chest pain Presented to the ED with 10/10 chest pain that radiated to the jaw and down his left arm. CXR unremarkable, no active disease without evidence of pneumonia or pulmonary edema. EKG on admission notable for possible ST  elevation in inferior lead with otherwise normal sinus rhythm. Troponins trended flat. Given morphine in the ED. Admitted for ACS rule out, cardiology consulted and did not feel any further intervention. Patient chest pain likely musculoskeletal in nature, cardiology did not determine that this was ischemic in nature. Possibly multifactorial causes with potential GI component given extensive GERD history. Pain management deescalated to oxycodone 2.5 mg which he was discharged on for 7 days following discharge. At time of discharge, patient pain significantly improved to 2/10.   Hypertension  Moderately hypertensive on admission, noted to have normotensive blood pressure soon after. Home medications coreg and chlorthalidone restarted. Blood pressures overall remained appropriate throughout admission. Home med imdur increased to 60 mg and coreg 6.25 mg by cardiology. BP at goal prior to admission.   AKI Normal creatinine noted on admission. AKI of Cr 1.44 developed a few days prior to discharge which improved on the day of discharge to 1.34. Chlorthalidone held by cardiology. Please follow up with PCP and cardiology to reassess and determine if spironolactone needs to be held as well.    All other issues chronic and stable.    Issues for follow up:  1. Follow up with cardiology outpatient on 11/02/2020, [place]. Please ensure patient follows up since multiple medication changes were made and patient has extensive cardiac history.  2. Repeat BMP, AKI noted shortly before discharge that improved. Also seems to have chronic hyponatremia. Monitor Na.  3. Recheck BP given multiple medication changes during hospitalization.  4. Ensure appropriate bowel regimen.    Significant Procedures:  none  Significant Labs and Imaging:  Recent Labs  Lab 10/02/20 0104 10/03/20 0135 10/04/20 0215  WBC 10.6* 10.6* 10.0  HGB 16.0 16.4 16.4  HCT 46.4 48.7  47.0  PLT 275 303 264   Recent Labs  Lab  09/30/20 1326 10/01/20 0239 10/02/20 0104 10/03/20 0135 10/04/20 0215  NA 133* 133* 131* 133* 131*  K 3.6 3.8 3.8 4.0 3.4*  CL 101 104 100 99 98  CO2 21* 18* 19* 15* 18*  GLUCOSE 153* 100* 116* 138* 138*  BUN 12 11 16 22  25*  CREATININE 1.04 1.12 1.11 1.44* 1.34*  CALCIUM 9.3 8.9 9.2 9.4 9.2  MG  --  1.7 1.8  --   --   ALKPHOS  --  91  --   --   --   AST  --  18  --   --   --   ALT  --  20  --   --   --   ALBUMIN  --  3.3*  --   --   --       Results/Tests Pending at Time of Discharge:  none  Discharge Medications:  Allergies as of 10/04/2020      Reactions   Aspergum [aspirin] Anaphylaxis   Aspirin Swelling   Lisinopril Anaphylaxis   Angiotensin Receptor Blockers    Never taken but PMH of angioedema with ACE-I ( Lisinopril)   Heparin Other (See Comments)   Reaction:  Bleeding       Medication List    STOP taking these medications   amLODipine 10 MG tablet Commonly known as: NORVASC   chlorthalidone 25 MG tablet Commonly known as: HYGROTON   gabapentin 100 MG capsule Commonly known as: NEURONTIN   glipiZIDE 10 MG tablet Commonly known as: GLUCOTROL   guaiFENesin-dextromethorphan 100-10 MG/5ML syrup Commonly known as: ROBITUSSIN DM   Lactulose 20 GM/30ML Soln   metoCLOPramide 10 MG tablet Commonly known as: REGLAN   morphine 15 MG 12 hr tablet Commonly known as: MS CONTIN   ondansetron 4 MG disintegrating tablet Commonly known as: ZOFRAN-ODT   tamsulosin 0.4 MG Caps capsule Commonly known as: FLOMAX     TAKE these medications   acetaminophen 325 MG tablet Commonly known as: TYLENOL Take 2 tablets (650 mg total) by mouth every 6 (six) hours.   atorvastatin 80 MG tablet Commonly known as: LIPITOR Take 80 mg by mouth daily.   carvedilol 6.25 MG tablet Commonly known as: COREG Take 1 tablet (6.25 mg total) by mouth 2 (two) times daily with a meal. What changed:   medication strength  how much to take   clopidogrel 75 MG  tablet Commonly known as: PLAVIX Take 75 mg by mouth every morning.   DULoxetine 30 MG capsule Commonly known as: CYMBALTA Take 30 mg by mouth daily.   ezetimibe 10 MG tablet Commonly known as: ZETIA Take 1 tablet (10 mg total) by mouth daily.   isosorbide mononitrate 60 MG 24 hr tablet Commonly known as: IMDUR Take 1 tablet (60 mg total) by mouth daily. Start taking on: October 05, 2020 What changed:   medication strength  how much to take   Jardiance 25 MG Tabs tablet Generic drug: empagliflozin Take 25 mg by mouth daily.   melatonin 5 MG Tabs Take 5 mg by mouth at bedtime.   metFORMIN 500 MG tablet Commonly known as: GLUCOPHAGE Take 500 mg by mouth 2 (two) times daily with a meal.   multivitamin with minerals Tabs tablet Take 1 tablet by mouth daily.   nitroGLYCERIN 0.4 MG SL tablet Commonly known as: NITROSTAT Place 1 tablet (0.4 mg total) under the tongue every 5 (five) minutes as  needed for chest pain.   oxyCODONE 5 MG immediate release tablet Commonly known as: Oxy IR/ROXICODONE Take 0.5 tablets (2.5 mg total) by mouth 2 (two) times daily for 7 days. What changed:   when to take this  reasons to take this   pantoprazole 40 MG tablet Commonly known as: PROTONIX Take 1 tablet (40 mg total) by mouth daily.   polyethylene glycol 17 g packet Commonly known as: MIRALAX / GLYCOLAX Take 17 g by mouth daily.   rivaroxaban 20 MG Tabs tablet Commonly known as: XARELTO Take 1 tablet (20 mg total) by mouth daily with supper.   senna-docusate 8.6-50 MG tablet Commonly known as: Senokot-S Take 2 tablets by mouth 2 (two) times daily.   spironolactone 25 MG tablet Commonly known as: ALDACTONE Take 1 tablet by mouth daily.   sucralfate 1 g tablet Commonly known as: CARAFATE Take 1 tablet (1 g total) by mouth 4 (four) times daily -  with meals and at bedtime.   VISINE OP Place 1 drop into both eyes daily as needed (dry red eyes).            Durable  Medical Equipment  (From admission, onward)         Start     Ordered   10/03/20 0703  For home use only DME Walker rolling  Once       Question Answer Comment  Walker: With 5 Inch Wheels   Patient needs a walker to treat with the following condition Chest pain      10/03/20 0702          Discharge Instructions: Please refer to Patient Instructions section of EMR for full details.  Patient was counseled important signs and symptoms that should prompt return to medical care, changes in medications, dietary instructions, activity restrictions, and follow up appointments.   Follow-Up Appointments:  Follow-up Information    Lendon Colonel, NP Follow up.   Specialties: Nurse Practitioner, Radiology, Cardiology Why: [facility] (Cardiology) - [facility] in [place] - a followup has been arranged for you on Friday Nov 02, 2020 10:45 AM (Arrive by 10:30 AM). Contact information: [address] [place] [address] [place] Schedule an appointment as soon as possible for a visit.   Specialty: General Practice Why: Please make an appointment to be seen in 1 week for a hospital follow up.  Contact information: [address] [place] [address] [phone]        Nelva Bush, MD .   Specialty: Cardiology Contact information: [place] [address] [phone]               Donney Dice, DO 10/04/2020, 12:33 PM PGY-1, [place]

## 2020-10-04 NOTE — TOC Transition Note (Signed)
Transition of Care [facility]) - CM/SW Discharge Note   Patient Details  Name: Bryan Scott MRN: [id number] Date of Birth: [birth year]  [facility]) CM/SW Contact:  Vinie Sill, LCSW Phone Number: 10/04/2020, 1:22 PM   Clinical Narrative:     Patient will Discharge to: [facility] Discharge Date: 10/04/20 Family Notified: Coletta,sister Transport [id number]  Per MD patient is ready for discharge. RN, patient, and facility notified of discharge. Discharge Summary sent to facility. RN given number for [phone]. Ambulance transport requested for patient.   Clinical Social Worker signing off.  Thurmond Butts, MSW, LCSW Clinical Social Worker     Barriers to Discharge: Continued Medical Work up   Patient Goals and CMS Choice Patient states their goals for this hospitalization and ongoing recovery are:: to go to SNF CMS Medicare.gov Compare Post Acute Care list provided to:: Patient Choice offered to / list presented to : Patient  Discharge Placement                       Discharge Plan and Services In-house Referral: Clinical Social Work                                   Social Determinants of Health ([place]) Interventions     Readmission Risk Interventions No flowsheet data found.

## 2020-10-04 NOTE — Progress Notes (Signed)
Dr. Percival Spanish recommends switching appt to a few weeks out and recommends that he follow up in [place] instead of [place] since he will be residing at a SNF in [place]. F/u made Friday Nov 02, 2020 and placed on AVS.

## 2020-10-04 NOTE — Discharge Instructions (Signed)
You were hospitalized at [facility] due to chest pain.  We expect this is from muscle pain and less likely cardiac related which improved after medication  We are so glad you are feeling better.  Please also be sure to follow-up with your PCP in 1 week for a hospital follow up. Please follow up with cardiology outpatient on 11/02/2020. Thank you for allowing [place] to be a part of your medical care.  Take care, [facility] family medicine team

## 2020-10-04 NOTE — Progress Notes (Signed)
Progress Note  Patient Name: Bryan Scott Date of Encounter: 10/04/2020  Primary Cardiologist:   Nelva Bush, MD   Subjective   Very mild chest pain.  No SOB.   Inpatient Medications    Scheduled Meds: . acetaminophen  650 mg Oral Q4H  . atorvastatin  80 mg Oral Daily  . carvedilol  6.25 mg Oral BID WC  . clopidogrel  75 mg Oral q morning  . DULoxetine  30 mg Oral Daily  . ezetimibe  10 mg Oral Daily  . gabapentin  100 mg Oral TID  . isosorbide mononitrate  60 mg Oral Daily  . pantoprazole  40 mg Oral Daily  . polyethylene glycol  17 g Oral Daily  . rivaroxaban  15 mg Oral Q supper  . spironolactone  25 mg Oral Daily  . sucralfate  1 g Oral TID WC & HS   Continuous Infusions:  PRN Meds: melatonin, nitroGLYCERIN, ondansetron, oxyCODONE   Vital Signs    Vitals:   10/03/20 1608 10/03/20 2225 10/04/20 0000 10/04/20 0400  BP: 132/75 111/81 105/74 124/62  Pulse: 79 80 72 71  Resp: 18 19 18 17   Temp: (!) 97.5 F (36.4 C) (!) 97.3 F (36.3 C) (!) 97.5 F (36.4 C) 97.8 F (36.6 C)  TempSrc: Axillary Axillary Axillary Axillary  SpO2: 98% 98% 94% 95%  Weight:      Height:        Intake/Output Summary (Last 24 hours) at 10/04/2020 1034 Last data filed at 10/04/2020 1000 Gross per 24 hour  Intake --  Output 1200 ml  Net -1200 ml   Filed Weights   09/30/20 1315 10/03/20 0433  Weight: 80.7 kg 77 kg    Telemetry    NSR, ectopy noted - Personally Reviewed  ECG    NA - Personally Reviewed  Physical Exam   GEN: No acute distress.   Neck: No  JVD Cardiac: RRR, no murmurs, rubs, or gallops.  Respiratory: Clear  to auscultation bilaterally. GI: Soft, nontender, non-distended  MS: No  edema; No deformity. Neuro:  Nonfocal  Psych: Normal affect   Labs    Chemistry Recent Labs  Lab 10/01/20 0239 10/02/20 0104 10/03/20 0135 10/04/20 0215  NA 133* 131* 133* 131*  K 3.8 3.8 4.0 3.4*  CL 104 100 99 98  CO2 18* 19* 15* 18*  GLUCOSE 100* 116* 138*  138*  BUN 11 16 22  25*  CREATININE 1.12 1.11 1.44* 1.34*  CALCIUM 8.9 9.2 9.4 9.2  PROT 6.1*  --   --   --   ALBUMIN 3.3*  --   --   --   AST 18  --   --   --   ALT 20  --   --   --   ALKPHOS 91  --   --   --   BILITOT 2.6*  --   --   --   GFRNONAA >60 >60 51* 55*  ANIONGAP 11 12 19* 15     Hematology Recent Labs  Lab 10/02/20 0104 10/03/20 0135 10/04/20 0215  WBC 10.6* 10.6* 10.0  RBC 4.86 4.99 4.93  HGB 16.0 16.4 16.4  HCT 46.4 48.7 47.0  MCV 95.5 97.6 95.3  MCH 32.9 32.9 33.3  MCHC 34.5 33.7 34.9  RDW 13.8 13.9 13.7  PLT 275 303 264    Cardiac EnzymesNo results for input(s): TROPONINI in the last 168 hours. No results for input(s): TROPIPOC in the last 168 hours.  BNPNo results for input(s): BNP, PROBNP in the last 168 hours.   DDimer No results for input(s): DDIMER in the last 168 hours.   Radiology    No results found.  Cardiac Studies   Echo from 09/04/2020:  1. Left ventricular ejection fraction, by estimation, is 70 to 75%. The  left ventricle has hyperdynamic function. Left ventricular endocardial  border not optimally defined to evaluate regional wall motion. Left  ventricular diastolic parameters are  consistent with Grade I diastolic dysfunction (impaired relaxation).  2. Right ventricular systolic function is normal. The right ventricular  size is normal.  3. The mitral valve is grossly normal. Trivial mitral valve  regurgitation. No evidence of mitral stenosis.  4. The aortic valve is tricuspid. There is mild calcification of the  aortic valve. There is mild thickening of the aortic valve. Aortic valve  regurgitation is not visualized. Mild to moderate aortic valve  sclerosis/calcification is present, without any  evidence of aortic stenosis.  5. The inferior vena cava is normal in size with greater than 50%  respiratory variability, suggesting right atrial pressure of 3 mmHg.    LHC on 07/13/2020:   Prox RCA to Dist RCA lesion  is 100% stenosed.  Dist Graft lesion is 95% stenosed.  Ost LM to Mid LM lesion is 40% stenosed.  Ost Cx to Prox Cx lesion is 80% stenosed.  Mid Graft lesion is 20% stenosed.  Mid Cx lesion is 100% stenosed.  RPDA lesion is 90% stenosed.  Origin lesion is 100% stenosed.  The left ventricular ejection fraction is greater than 65% by visual estimate.  LV end diastolic pressure is normal.  The left ventricular systolic function is normal.  There is no aortic valve stenosis.  Conclusions: 1. Multivessel coronary artery and bypass graft disease, as detailed in Dr. Donivan Scull diagnostic catheterization report. Culprit lesion appears to be 95% stenosis in SVG-RCA. 2. Successful PCI to SVG-RCA using Resolute Onyx 3.5 x 18 mm drug-eluting stent with 0% residual stenosis and TIMI-3 flow.  Recommendations: 1. Remove right femoral artery sheath 2 hours after discontinuation of bivalirudin. 2. Restart heparin 8 hours after sheath removal. Transition to rivaroxaban tomorrow if no evidence of bleeding/vascular complication. 3. Continue clopidogrel for 12 months (defer aspirin in the setting of aspirin allergy on chronic anticoagulation with rivaroxaban). 4. Aggressive secondary prevention.   Patient Profile     75 y.o. male with a PMH of CAD s/p CABG in 2006and PCI with DES to SVG-RCAon1/28/2022, Paroxsymal A fib(on Xarelto), type 2 DM, hypertension, hyperlipidemia, carotid artery stenosis, cervical spinal stenosis s/p C5-C7 ACDF, chronic pain syndrome, hx of tobacco use, [facility] for left sided chest pain. Cardiology is consulted for chest pain by hospitalist Dr Tawanna Solo.  Assessment & Plan    CAD:    He does have follow up on 5/5.   However, he is at rehab in [facility] and has follow up in [place].  I will defer the follow up until he is discharged from rehab.    Reduced Imdur secondary to hypotension.  I would continue with increased Coreg however.      AKI:   Creat is improved.  No need to restart hydrodiuril at discharge.      PAF:   As creat comes down he might need to be discharged on his previous 20 mg Xarelto dose.     HTN:   BP was low but has rebounded.  Off Hydrodiuril.      For questions  or updates, please contact [facility] Please consult [email or web address] for contact info under Cardiology/STEMI.   Signed, Minus Breeding, MD  10/04/2020, 10:34 AM

## 2020-10-05 ENCOUNTER — Non-Acute Institutional Stay (SKILLED_NURSING_FACILITY): Payer: Medicare PPO | Admitting: Adult Health

## 2020-10-05 ENCOUNTER — Encounter: Payer: Self-pay | Admitting: Adult Health

## 2020-10-05 DIAGNOSIS — I10 Essential (primary) hypertension: Secondary | ICD-10-CM

## 2020-10-05 DIAGNOSIS — E1151 Type 2 diabetes mellitus with diabetic peripheral angiopathy without gangrene: Secondary | ICD-10-CM

## 2020-10-05 DIAGNOSIS — N179 Acute kidney failure, unspecified: Secondary | ICD-10-CM

## 2020-10-05 DIAGNOSIS — I2510 Atherosclerotic heart disease of native coronary artery without angina pectoris: Secondary | ICD-10-CM

## 2020-10-05 DIAGNOSIS — R079 Chest pain, unspecified: Secondary | ICD-10-CM

## 2020-10-05 DIAGNOSIS — G894 Chronic pain syndrome: Secondary | ICD-10-CM

## 2020-10-05 DIAGNOSIS — K3184 Gastroparesis: Secondary | ICD-10-CM

## 2020-10-05 DIAGNOSIS — I480 Paroxysmal atrial fibrillation: Secondary | ICD-10-CM

## 2020-10-05 NOTE — Progress Notes (Signed)
Location:  [place] Room Number: 216/A Place of Service:  SNF (31) Provider:  Durenda Age, DNP, FNP-BC  Patient Care Team: [place], [place] as PCP - General ([place]) End, Harrell Gave, MD as PCP - Cardiology (Cardiology) [name], MD (Family Medicine)  Extended Emergency Contact Information Primary Emergency Contact: [place], [place] Phone: [phone] Mobile Phone: [phone] Relation: Sister Secondary Emergency Contact: Clydene Pugh Home Phone: [phone] Mobile Phone: [phone] Relation: Sister  Code Status: Full Code   Goals of care: Advanced Directive information Advanced Directives 10/05/2020  Does Patient Have a Medical Advance Directive? No  Type of Advance Directive -  Does patient want to make changes to medical advance directive? No - Patient declined  Copy of [place] in Chart? -  Would patient like information on creating a medical advance directive? -     Chief Complaint  Patient presents with  . Hospitalization Follow-up    Hospitalization Follow Up     HPI:  Pt is a 75 y.o. male who was re-admitted to [facility] on 10/04/2020 post hospital admission 09/30/20 to 10/04/20.  He has a PMH of atrial fibrillation, GERD, CAD S/P CABG and coronary stent placement, hypertension, hyperlipidemia, type 2 diabetes mellitus and spinal stenosis.  Of note, he was having a short-term rehabilitation at [facility] post hospitalization 09/17/20 to 09/25/2020 for syncopal episode after micturition and a day of vomiting and diarrhea.  He was treated for gastroparesis and was discharged on Protonix, Reglan and sucralfate. He was brought to the ED on 09/30/2020 due to chest pain that radiated to the jaw down his left arm.  Chest x-ray was negative for pneumonia or pulmonary edema.  EKG on admission was notable for possible ST elevation in inferior lead.  Troponins trended  flat.  He was given morphine in the ED.  Cardiology was consulted and did not feel any further intervention needed.  His chest pain was likely musculoskeletal in nature.  Cardiology did not determine that this was ischemic in nature.  This was possibly due to multifactorial causes with potential GI component given extensive GERD history.  Pain management was deescalated to  oxycodone 2.5 mg and discharged on for 7 days.   Past Medical History:  Diagnosis Date  . 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 Back injuries   . Bladder cancer ([place])   . Cancer ([place])   . Carotid artery disease (HCC)    > 75% bilateral ICA stenoses on 01/2013 CT  . Chronic pain syndrome   . Coronary artery disease    s/p CABG ~ 2007  . 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 GERD (gastroesophageal reflux disease)   . Heparin allergy    Bleeding  . History of tobacco use   . Hx of CABG   . Hx of cardiac cath   . Hyperlipidemia   . Hypertension   . Ischemic cardiomyopathy   . Paroxysmal atrial fibrillation (HCC)    On Xarelto anticoagulation  . Type 2 diabetes mellitus (HCC)    A1C 6.8% in 01/2013   Past Surgical History:  Procedure Laterality Date  . CARDIAC CATHETERIZATION    . CORONARY ARTERY BYPASS GRAFT  2007  . CORONARY STENT INTERVENTION N/A 07/13/2020   Procedure: CORONARY STENT INTERVENTION;  Surgeon: Nelva Bush, MD;  Location: [place] CV LAB;  Service: Cardiovascular;  Laterality: N/A;  . ESOPHAGOGASTRODUODENOSCOPY (EGD) WITH PROPOFOL N/A 09/20/2020   Procedure: ESOPHAGOGASTRODUODENOSCOPY (EGD) WITH  PROPOFOL;  Surgeon: Ladene Artist, MD;  Location: [facility] ENDOSCOPY;  Service: Endoscopy;  Laterality: N/A;  . LEFT HEART CATH AND CORS/GRAFTS ANGIOGRAPHY N/A 07/13/2020   Procedure: LEFT HEART CATH AND CORS/GRAFTS ANGIOGRAPHY;  Surgeon: Minna Merritts, MD;  Location: [place] CV LAB;  Service: Cardiovascular;  Laterality: N/A;    Allergies  Allergen Reactions  . Aspergum  [Aspirin] Anaphylaxis  . Aspirin Swelling  . Lisinopril Anaphylaxis  . Angiotensin Receptor Blockers     Never taken but PMH of angioedema with ACE-I ( Lisinopril)  . Heparin Other (See Comments)    Reaction:  Bleeding     Outpatient Encounter Medications as of 10/05/2020  Medication Sig  . acetaminophen (TYLENOL) 325 MG tablet Take 2 tablets (650 mg total) by mouth every 6 (six) hours.  Marland Kitchen atorvastatin (LIPITOR) 80 MG tablet Take 80 mg by mouth daily.  . carvedilol (COREG) 6.25 MG tablet Take 1 tablet (6.25 mg total) by mouth 2 (two) times daily with a meal.  . clopidogrel (PLAVIX) 75 MG tablet Take 75 mg by mouth every morning.  . DULoxetine (CYMBALTA) 30 MG capsule Take 30 mg by mouth daily.  . empagliflozin (JARDIANCE) 25 MG TABS tablet Take 25 mg by mouth daily.  Marland Kitchen ezetimibe (ZETIA) 10 MG tablet Take 1 tablet (10 mg total) by mouth daily.  . isosorbide mononitrate (IMDUR) 60 MG 24 hr tablet Take 1 tablet (60 mg total) by mouth daily.  . melatonin 5 MG TABS Take 5 mg by mouth at bedtime.  . metFORMIN (GLUCOPHAGE) 500 MG tablet Take 500 mg by mouth 2 (two) times daily with a meal.  . Multiple Vitamin (MULTIVITAMIN WITH MINERALS) TABS tablet Take 1 tablet by mouth daily.  . nitroGLYCERIN (NITROSTAT) 0.4 MG SL tablet Place 1 tablet (0.4 mg total) under the tongue every 5 (five) minutes as needed for chest pain.  Marland Kitchen oxyCODONE (OXY IR/ROXICODONE) 5 MG immediate release tablet Take 0.5 tablets (2.5 mg total) by mouth 2 (two) times daily for 7 days.  . pantoprazole (PROTONIX) 40 MG tablet Take 1 tablet (40 mg total) by mouth daily.  . polyethylene glycol (MIRALAX / GLYCOLAX) 17 g packet Take 17 g by mouth daily.  . rivaroxaban (XARELTO) 20 MG TABS tablet Take 1 tablet (20 mg total) by mouth daily with supper.  . senna-docusate (SENOKOT-S) 8.6-50 MG tablet Take 2 tablets by mouth 2 (two) times daily.  Marland Kitchen spironolactone (ALDACTONE) 25 MG tablet Take 1 tablet by mouth daily.  . sucralfate  (CARAFATE) 1 g tablet Take 1 tablet (1 g total) by mouth 4 (four) times daily -  with meals and at bedtime.  . Tetrahydrozoline HCl (VISINE OP) Place 1 drop into both eyes daily as needed (dry red eyes).   No facility-administered encounter medications on file as of 10/05/2020.    Review of Systems  GENERAL: No change in appetite, no fatigue, no weight changes, no fever, chills or weakness MOUTH and THROAT: Denies oral discomfort, gingival pain or bleeding RESPIRATORY: no cough, SOB, DOE, wheezing, hemoptysis CARDIAC: No chest pain, edema or palpitations GI: No abdominal pain, diarrhea, constipation, heart burn, nausea or vomiting GU: Denies dysuria, frequency, hematuria, incontinence, or discharge NEUROLOGICAL: Denies dizziness, syncope, numbness, or headache PSYCHIATRIC: Denies feelings of depression or anxiety. No report of hallucinations, insomnia, paranoia, or agitation   Immunization History  Administered Date(s) Administered  . DTaP 04/19/2008  . Dtap, Unspecified 04/19/2008  . Influenza-Unspecified 04/25/1997, 03/06/2019, 03/23/2020, 04/17/2020  . Janssen (J&J) SARS-COV-2 Vaccination  10/21/2019, 04/24/2020  . PPD Test 09/24/2016  . Pneumococcal Conjugate-13 04/19/2015  . Pneumococcal Polysaccharide-23 02/15/2008, 04/30/2016  . Td 04/25/1997, 10/27/2017  . Tdap 04/19/2008  . Zoster 07/30/2011  . Zoster Recombinat (Shingrix) 05/03/2020, 07/10/2020   Pertinent  Health Maintenance Due  Topic Date Due  . FOOT EXAM  Never done  . OPHTHALMOLOGY EXAM  Never done  . URINE MICROALBUMIN  Never done  . COLONOSCOPY (Pts 45-84yrs Insurance coverage will need to be confirmed)  Never done  . INFLUENZA VACCINE  01/14/2021  . HEMOGLOBIN A1C  03/20/2021  . PNA vac Low Risk Adult  Completed   No flowsheet data found.   Vitals:   10/05/20 0943  BP: 121/81  Pulse: 71  Resp: 18  Temp: (!) 96.8 F (36 C)  SpO2: 93%  Weight: 169 lb 12 oz (77 kg)  Height: 6' (1.829 m)   Body mass  index is 23.02 kg/m.  Physical Exam  GENERAL APPEARANCE: Well nourished. In no acute distress. Normal body habitus SKIN:  Skin is warm and dry.  MOUTH and THROAT: Lips are without lesions. Oral mucosa is moist and without lesions. Tongue is normal in shape, size, and color and without lesions RESPIRATORY: Breathing is even & unlabored, BS CTAB CARDIAC: RRR, no murmur,no extra heart sounds, no edema GI: Abdomen soft, normal BS, no masses, no tenderness EXTREMITIES:  Able to move X 4 extremities NEUROLOGICAL: There is no tremor. Speech is clear.  Alert and oriented X 3 PSYCHIATRIC:. Affect and behavior are appropriate  Labs reviewed: Recent Labs    07/19/20 0531 07/20/20 0508 08/07/20 1538 09/18/20 1943 09/19/20 0118 09/19/20 0836 09/20/20 0034 10/01/20 0239 10/02/20 0104 10/03/20 0135 10/04/20 0215  NA 133* 130*   < > 134*   < >  --    < > 133* 131* 133* 131*  K 4.0 3.7   < > 3.5   < >  --    < > 3.8 3.8 4.0 3.4*  CL 101 102   < > 103   < >  --    < > 104 100 99 98  CO2 24 23   < > 25   < >  --    < > 18* 19* 15* 18*  GLUCOSE 198* 154*   < > 184*   < >  --    < > 100* 116* 138* 138*  BUN 28* 28*   < > 21   < >  --    < > 11 16 22  25*  CREATININE 1.06 0.93   < > 1.31*   < >  --    < > 1.12 1.11 1.44* 1.34*  CALCIUM 9.3 9.0   < > 8.2*   < >  --    < > 8.9 9.2 9.4 9.2  MG 1.8 1.8   < >  --   --  1.8  --  1.7 1.8  --   --   PHOS 2.7 3.1  --  2.7  --   --   --   --   --   --   --    < > = values in this interval not displayed.   Recent Labs    09/19/20 0118 09/20/20 0034 10/01/20 0239  AST 16 19 18   ALT 16 17 20   ALKPHOS 59 68 91  BILITOT 1.8* 1.5* 2.6*  PROT 5.5* 6.0* 6.1*  ALBUMIN 2.9* 3.2* 3.3*   Recent Labs  09/06/20 0557 09/17/20 1450 09/18/20 0135 09/19/20 0118 09/19/20 0836 10/02/20 0104 10/03/20 0135 10/04/20 0215  WBC 8.3 15.9*   < > 9.9   < > 10.6* 10.6* 10.0  NEUTROABS 4.9 12.5*  --  5.7  --   --   --   --   HGB 14.6 20.1*   < > 13.9   < > 16.0  16.4 16.4  HCT 41.8 55.8*   < > 39.2   < > 46.4 48.7 47.0  MCV 95.0 92.4   < > 93.6   < > 95.5 97.6 95.3  PLT 161 247   < > 174   < > 275 303 264   < > = values in this interval not displayed.   Lab Results  Component Value Date   TSH 2.495 10/01/2020   Lab Results  Component Value Date   HGBA1C 6.9 (H) 09/18/2020   Lab Results  Component Value Date   CHOL 95 08/08/2020   HDL 42 08/08/2020   LDLCALC 29 08/08/2020   TRIG 121 08/08/2020   CHOLHDL 2.3 08/08/2020    Significant Diagnostic Results in last 30 days:  DG Chest 1 View  Result Date: 09/17/2020 CLINICAL DATA:  Fall, right shoulder pain. EXAM: CHEST  1 VIEW COMPARISON:  None. FINDINGS: Prior CABG. Heart and mediastinal contours are within normal limits. No focal opacities or effusions. No acute bony abnormality. No visible displaced rib fracture or pneumothorax. IMPRESSION: No active disease. Electronically Signed   By: Rolm Baptise M.D.   On: 09/17/2020 18:02   DG Ribs Unilateral Right  Result Date: 09/24/2020 CLINICAL DATA:  And lower right rib pain after a fall week ago. EXAM: RIGHT RIBS - 2 VIEW COMPARISON:  Chest x-ray dated September 17, 2020. CTA chest dated September 03, 2020. FINDINGS: No fracture or other bone lesions are seen involving the ribs. Unchanged right nephrolithiasis. IMPRESSION: 1. Negative right ribs. 2. Unchanged right nephrolithiasis. Electronically Signed   By: Titus Dubin M.D.   On: 09/24/2020 19:31   DG Lumbar Spine 2-3 Views  Result Date: 09/17/2020 CLINICAL DATA:  Back pain, fall EXAM: LUMBAR SPINE - 2-3 VIEW COMPARISON:  None. FINDINGS: Partial fusion across the L3-4, L4-5 and L5-S1 disc spaces. Normal alignment. Moderate to advanced degenerative disc and facet disease diffusely. No fracture. IMPRESSION: Diffuse degenerative disc and facet disease. No acute bony abnormality. Electronically Signed   By: Rolm Baptise M.D.   On: 09/17/2020 18:01   DG Pelvis 1-2 Views  Result Date: 09/17/2020 CLINICAL  DATA:  Fall, low back pain radiating to right side. EXAM: PELVIS - 1-2 VIEW COMPARISON:  None. FINDINGS: Symmetric degenerative changes in the hips. Degenerative changes in the visualized lower lumbar spine. No acute bony abnormality. Specifically, no fracture, subluxation, or dislocation. IMPRESSION: No acute bony abnormality. Electronically Signed   By: Rolm Baptise M.D.   On: 09/17/2020 18:00   DG Shoulder Right  Result Date: 09/17/2020 CLINICAL DATA:  Right shoulder pain EXAM: RIGHT SHOULDER - 2+ VIEW COMPARISON:  None. FINDINGS: There is no evidence of fracture or dislocation. There is diffuse osteopenia. Moderate AC and glenohumeral joint osteoarthritis is seen with joint space loss. Mild overlying soft tissue swelling is seen. IMPRESSION: No acute osseous abnormality. Electronically Signed   By: Prudencio Pair M.D.   On: 09/17/2020 18:00   DG Forearm Left  Result Date: 09/19/2020 CLINICAL DATA:  Pain following recent fall EXAM: LEFT FOREARM - 2 VIEW COMPARISON:  None. FINDINGS: Frontal and  lateral views were obtained. No appreciable fracture or dislocation. Joint spaces appear normal. No erosive change. IMPRESSION: No fracture or dislocation.  No appreciable arthropathy. Electronically Signed   By: Lowella Grip III M.D.   On: 09/19/2020 13:27   CT HEAD WO CONTRAST  Result Date: 09/18/2020 CLINICAL DATA:  Syncope EXAM: CT HEAD WITHOUT CONTRAST TECHNIQUE: Contiguous axial images were obtained from the base of the skull through the vertex without intravenous contrast. COMPARISON:  08/07/2020 FINDINGS: Brain: No acute intracranial abnormality. Specifically, no hemorrhage, hydrocephalus, mass lesion, acute infarction, or significant intracranial injury. Vascular: No hyperdense vessel or unexpected calcification. Skull: No acute calvarial abnormality. Sinuses/Orbits: No acute findings Other: None IMPRESSION: No acute intracranial abnormality. Electronically Signed   By: Rolm Baptise M.D.   On:  09/18/2020 00:31   DG Chest Portable 1 View  Result Date: 09/30/2020 CLINICAL DATA:  Chest pain and shortness of breath. EXAM: PORTABLE CHEST 1 VIEW COMPARISON:  Chest x-ray dated 09/17/2020. FINDINGS: Heart size and mediastinal contours are stable. Median sternotomy wires appear intact and stable in alignment. Lungs are clear. No pleural effusion or pneumothorax is seen. IMPRESSION: No active disease. No evidence of pneumonia or pulmonary edema. Electronically Signed   By: Franki Cabot M.D.   On: 09/30/2020 15:22   DG Humerus Left  Result Date: 09/19/2020 CLINICAL DATA:  Pain following fall EXAM: LEFT HUMERUS - 2+ VIEW COMPARISON:  May 03, 2012 FINDINGS: Frontal and lateral views were obtained. There is screw and plate fixation through a previous fracture of the proximal left humerus. There is remodeling with bony overgrowth in this area. No acute fracture or dislocation. No appreciable joint space narrowing or erosion. IMPRESSION: Postoperative fixation for prior fracture proximal left humerus with bony overgrowth and remodeling in the proximal left humerus. No acute fracture or dislocation. No appreciable joint space narrowing or erosion. Electronically Signed   By: Lowella Grip III M.D.   On: 09/19/2020 13:26   DG Humerus Right  Result Date: 09/17/2020 CLINICAL DATA:  Fall, right shoulder pain EXAM: RIGHT HUMERUS - 2+ VIEW COMPARISON:  None. FINDINGS: Moderate to advanced degenerative changes in the right [facility] joint with joint space narrowing and spurring. Early degenerative changes in the glenohumeral joint. No acute bony abnormality. Specifically, no fracture, subluxation, or dislocation. IMPRESSION: Degenerative changes in the right shoulder. No acute bony abnormality. Electronically Signed   By: Rolm Baptise M.D.   On: 09/17/2020 18:01   CT Angio Abd/Pel w/ and/or w/o  Addendum Date: 09/25/2020   ADDENDUM REPORT: 09/25/2020 08:09 ADDENDUM: No visible lower rib fracture or focal  pathology. Electronically Signed   By: Rolm Baptise M.D.   On: 09/25/2020 08:09   Addendum Date: 09/19/2020   ADDENDUM REPORT: 09/19/2020 01:15 ADDENDUM: There is a contusion involving the patient's right flank. There is a small 0.7 cm hyperattenuating area in the posterior right flank (axial series 5, image 38). This was not visualized on the patient's prior study from September 03, 2020. There is an additional smaller hyperattenuating focus more superiorly measuring approximately 6 mm (axial series 5, image 26). Both of these areas are concerning for areas of active extravasation versus is small pseudoaneurysms in the setting of trauma. Exam is limited by lack of a venous phase. These results were called by telephone at the time of interpretation on 09/19/2020 at 1:14 am to provider Jeannine Kitten , who verbally acknowledged these results. Electronically Signed   By: Constance Holster M.D.   On: 09/19/2020 01:15  Result Date: 09/25/2020 CLINICAL DATA:  Upper GI bleed post endoscopy. EXAM: CTA ABDOMEN AND PELVIS WITHOUT AND WITH CONTRAST TECHNIQUE: Multidetector CT imaging of the abdomen and pelvis was performed using the standard protocol during bolus administration of intravenous contrast. Multiplanar reconstructed images and MIPs were obtained and reviewed to evaluate the vascular anatomy. CONTRAST:  131mL OMNIPAQUE IOHEXOL 350 MG/ML SOLN COMPARISON:  September 03, 2020 FINDINGS: VASCULAR Aorta: Atherosclerotic changes are noted without evidence for an aneurysm. Celiac: There is variant celiac artery anatomy. The common hepatic artery arises directly from the aorta. There is significant narrowing of the origin of the common hepatic artery. The splenic artery arises directly from the aorta. There is no significant narrowing. SMA: There is mild-to-moderate narrowing involving the SMA. Renals: There is mild narrowing of the left renal artery. There is mild narrowing of the right renal artery. IMA: There is moderate narrowing at  the origin of the IMA. Inflow: There is a right common iliac artery stent that is patent. There is mild narrowing of the distal right common iliac artery. Proximal Outflow: Bilateral common femoral and visualized portions of the superficial and profunda femoral arteries are patent without evidence of aneurysm, dissection, vasculitis or significant stenosis. Veins: No obvious venous abnormality within the limitations of this arterial phase study. Review of the MIP images confirms the above findings. NON-VASCULAR Lower chest: There is atelectasis at the lung bases.The heart size is mildly enlarged. There is reflux of contrast into the IVC. Hepatobiliary: The liver is normal. Normal gallbladder.There is no biliary ductal dilation. Pancreas: The pancreatic duct is slightly prominent. Spleen: Unremarkable. Adrenals/Urinary Tract: --Adrenal glands: Unremarkable. --Right kidney/ureter: Again noted is a nonobstructing stone in the right kidney measuring approximately 1.4 cm. There is no hydronephrosis. --Left kidney/ureter: No hydronephrosis or radiopaque kidney stones. --Urinary bladder: Unremarkable. Stomach/Bowel: --Stomach/Duodenum: There is hyperdense material within stomach. --Small bowel: Unremarkable. --Colon: Unremarkable. --Appendix: Normal. Vascular/Lymphatic: Atherosclerotic calcification is present within the non-aneurysmal abdominal aorta, without hemodynamically significant stenosis. There is mild-to-moderate narrowing of the SMA both in the mid and distal sections. There is mild narrowing of the left renal artery. --No retroperitoneal lymphadenopathy. --No mesenteric lymphadenopathy. --No pelvic or inguinal lymphadenopathy. Reproductive: Unremarkable Other: No ascites or free air. The abdominal wall is normal. Musculoskeletal. There are advanced degenerative changes throughout the lumbar spine. No acute fracture. IMPRESSION: 1. Exam is significantly limited by the lack of a noncontrast phase and a the late  venous phase. 2. While there is no definite active arterial bleeding, there is hyperdense material within the stomach which is nonspecific. This could represent extravasated contrast or hyperdense food material. If there is clinical concern for ongoing GI bleeding, a repeat study should be performed with a noncontrast, arterial, and venous phase. 3. Nonobstructing right-sided nephrolithiasis. 4. Chronic findings as detailed above. 5.  Aortic Atherosclerosis (ICD10-I70.0). Electronically Signed: By: Constance Holster M.D. On: 09/19/2020 00:19    Assessment/Plan  1. Left-sided chest pain -Chest x-ray was negative for pneumonia -Cardiology was consulted and did feel any further intervention needed -Chest pain was likely musculoskeletal in nature with potential component given extensive GERD history -Continue oxycodone 2.5 mg twice a day with stop date 10/11/2020 -Follow-up with cardiology on 11/02/2020  2. Essential hypertension -Imdur increased to 60 mg daily and Coreg 6.25 mg twice a day  3. AKI (acute kidney injury) [facility]) Lab Results  Component Value Date   NA 131 (L) 10/04/2020   K 3.4 (L) 10/04/2020   CO2 18 (L) 10/04/2020  GLUCOSE 138 (H) 10/04/2020   BUN 25 (H) 10/04/2020   CREATININE 1.34 (H) 10/04/2020   CALCIUM 9.2 10/04/2020   GFRNONAA 55 (L) 10/04/2020   GFRAA >60 12/13/2019   -Chlorthalidone was held by cardiology -  will monitor for  4. PAF (paroxysmal atrial fibrillation) (HCC) -   Rate controlled, continue carvedilol for rate control and Xarelto for anticoagulation  5. Gastroparesis -   Stable, continue pantoprazole and sucralfate  6. DM (diabetes mellitus), type 2 with peripheral vascular complications [facility]) Lab Results  Component Value Date   HGBA1C 6.9 (H) 09/18/2020   -Continue Jardiance and metformin  7. Coronary artery disease involving native coronary artery of native heart without angina pectoris -   Stable, continue isosorbide mononitrate, Plavix and  Zetia  8. Chronic pain syndrome -   Continue oxycodone 2.5 mg BID, DC on 10/11/2020 -   Continue duloxetine 30 mg 1 capsule daily -  Will re-start Gabapentin 100 mg BID    Family/ staff Communication: Discussed plan of care with resident and charge nurse.  Labs/tests ordered: BMP  Goals of care:   Short-term care   Durenda Age, DNP, MSN, FNP-BC [facility] [phone] (Monday-Friday 8:00 a.m. - 5:00 p.m.) [phone] (after hours)

## 2020-10-09 ENCOUNTER — Non-Acute Institutional Stay (SKILLED_NURSING_FACILITY): Payer: Medicare PPO | Admitting: Internal Medicine

## 2020-10-09 ENCOUNTER — Encounter: Payer: Self-pay | Admitting: Internal Medicine

## 2020-10-09 DIAGNOSIS — R079 Chest pain, unspecified: Secondary | ICD-10-CM | POA: Diagnosis not present

## 2020-10-09 DIAGNOSIS — N179 Acute kidney failure, unspecified: Secondary | ICD-10-CM

## 2020-10-09 DIAGNOSIS — K3184 Gastroparesis: Secondary | ICD-10-CM

## 2020-10-09 DIAGNOSIS — E1151 Type 2 diabetes mellitus with diabetic peripheral angiopathy without gangrene: Secondary | ICD-10-CM

## 2020-10-09 DIAGNOSIS — E1143 Type 2 diabetes mellitus with diabetic autonomic (poly)neuropathy: Secondary | ICD-10-CM | POA: Diagnosis not present

## 2020-10-09 NOTE — Assessment & Plan Note (Addendum)
4/17 - 10/04/2020 glucoses range from 100 up to 153.  Current A1c is 6.9% indicating adequate control. His diabetes is managed by the [facility].  The regimen will have to be clarified as he is on glipizide as well as insulin daily (not on Epic Med List) according to him.

## 2020-10-09 NOTE — Assessment & Plan Note (Addendum)
Symptoms have completely resolved

## 2020-10-09 NOTE — Assessment & Plan Note (Signed)
Avoid nephrotoxic drugs

## 2020-10-09 NOTE — Assessment & Plan Note (Signed)
He is chest pain-free at this time.

## 2020-10-09 NOTE — Progress Notes (Signed)
NURSING HOME LOCATION:  [facility] ROOM NUMBER: 111/A   CODE STATUS: Full Code   PCP:  [facility]  This is a nursing facility follow up visit for [facility] readmission within 30 days  Interim medical record and care since last SNF visit was updated with review of diagnostic studies and change in clinical status since last visit were documented.  HPI: He was rehospitalized 4/17 - 10/04/2020 with left-sided chest pain in the context of history of atrial fibrillation, hypertension, dyslipidemia, diabetes with vascular complications, and CAD for which he has had CABG and stent. EKG suggested possible ST elevation in inferior leads; rhythm normal sinus.  Troponin trended flat, 10-18.  ACS ruled out and no further intervention felt necessary by Cardiology.  Chest pain was felt to be musculoskeletal in etiology and not ischemic.  Pain management was de-escalated to oxycodone 2.5 mg to be administered as needed up to bid prn for 7 days following discharge.  At discharge pain was significantly improved to a score of 2 on a scale of 1-10. He was moderately hypertensive on admission which normalized.  Carvedilol and chlorthalidone were reinitiated.  Imdur was increased to 60 mg and carvedilol 6.25 by Cardiology. Course was complicated by development of AKI with creatinine 1.44 several days prior to discharge.  Chlorthalidone was held by Cardiology and creatinine improved to 1.34.  Review of systems: He denies any active cardiopulmonary or GI symptoms at this time.  He states that the medications have resolved the significant GI dysfunction he was having.  He states he was taking insulin once a day @ home from the [place]; but this is not on his med list. He no longer has postural hypotension symptoms as he performs isometrics prior to standing. He denies chest pain even with increased exercise with PT.  Constitutional: No fever, significant weight change, fatigue  Eyes: No  redness, discharge, pain, vision change ENT/mouth: No nasal congestion,  purulent discharge, earache, change in hearing, sore throat  Cardiovascular: No chest pain, palpitations, paroxysmal nocturnal dyspnea, claudication, edema  Respiratory: No cough, sputum production, hemoptysis, DOE, significant snoring, apnea   Gastrointestinal: No heartburn, dysphagia, abdominal pain, nausea /vomiting, rectal bleeding, melena, change in bowels Genitourinary: No dysuria, hematuria, pyuria, incontinence, nocturia Musculoskeletal: No joint stiffness, joint swelling, weakness, pain Dermatologic: No rash, pruritus, change in appearance of skin Neurologic: No dizziness, headache, syncope, seizures, numbness, tingling Psychiatric: No significant anxiety, depression, insomnia, anorexia Endocrine: No change in hair/skin/nails, excessive thirst, excessive hunger, excessive urination  Hematologic/lymphatic: No significant bruising, lymphadenopathy, abnormal bleeding Allergy/immunology: No itchy/watery eyes, significant sneezing, urticaria, angioedema  Physical exam:  Pertinent or positive findings: He was initially asleep but was awakened easily.  He is alert and oriented.  Pattern alopecia is present.  He has a well trimmed goatee.  He is edentulous.  Heart sounds are distant.  He has minor rales anteriorly.  Strength to opposition is good and equal.  He has extensive bruising over the forearms.  General appearance: Adequately nourished; no acute distress, increased work of breathing is present.   Lymphatic: No lymphadenopathy about the head, neck, axilla. Eyes: No conjunctival inflammation or lid edema is present. There is no scleral icterus. Ears:  External ear exam shows no significant lesions or deformities.   Nose:  External nasal examination shows no deformity or inflammation. Nasal mucosa are pink and moist without lesions, exudates Oral exam:  Lips and gums are healthy appearing. There is no oropharyngeal  erythema or exudate. Neck:  No thyromegaly, masses, tenderness noted.    Heart:  Normal rate and regular rhythm. S1 and S2 normal without gallop, murmur, click, rub .  Lungs:  without wheezes, rhonchi, rubs. Abdomen: Bowel sounds are normal. Abdomen is soft and nontender with no organomegaly, hernias, masses. GU: Deferred  Extremities:  No cyanosis, clubbing, edema  Neurologic exam :Strength equal  in upper & lower extremities Skin: Warm & dry w/o tenting. No significant  rash.  See summary under each active problem in the Problem List with associated updated therapeutic plan

## 2020-10-09 NOTE — Patient Instructions (Signed)
See assessment and plan under each diagnosis in the problem list and acutely for this visit 

## 2020-10-10 ENCOUNTER — Non-Acute Institutional Stay (SKILLED_NURSING_FACILITY): Payer: Medicare PPO | Admitting: Adult Health

## 2020-10-10 ENCOUNTER — Encounter: Payer: Self-pay | Admitting: Adult Health

## 2020-10-10 DIAGNOSIS — I2510 Atherosclerotic heart disease of native coronary artery without angina pectoris: Secondary | ICD-10-CM

## 2020-10-10 DIAGNOSIS — G894 Chronic pain syndrome: Secondary | ICD-10-CM

## 2020-10-10 DIAGNOSIS — R079 Chest pain, unspecified: Secondary | ICD-10-CM

## 2020-10-10 DIAGNOSIS — N179 Acute kidney failure, unspecified: Secondary | ICD-10-CM | POA: Diagnosis not present

## 2020-10-10 DIAGNOSIS — I10 Essential (primary) hypertension: Secondary | ICD-10-CM

## 2020-10-10 DIAGNOSIS — K3184 Gastroparesis: Secondary | ICD-10-CM

## 2020-10-10 DIAGNOSIS — E1151 Type 2 diabetes mellitus with diabetic peripheral angiopathy without gangrene: Secondary | ICD-10-CM | POA: Diagnosis not present

## 2020-10-10 DIAGNOSIS — E1143 Type 2 diabetes mellitus with diabetic autonomic (poly)neuropathy: Secondary | ICD-10-CM

## 2020-10-10 DIAGNOSIS — I480 Paroxysmal atrial fibrillation: Secondary | ICD-10-CM

## 2020-10-10 NOTE — Progress Notes (Signed)
Location:  [place] Room Number: 111/A Place of Service:  SNF (31) Provider:  Durenda Age, DNP, FNP-BC  Patient Care Team: [facility], [place] as PCP - General ([place]) End, Harrell Gave, MD as PCP - Cardiology (Cardiology) [name], MD (Family Medicine)  Extended Emergency Contact Information Primary Emergency Contact: [place], [place] Phone: [phone] Mobile Phone: [phone] Relation: Sister Secondary Emergency Contact: Clydene Pugh Home Phone: [phone] Mobile Phone: [phone] Relation: Sister  Code Status: Full Code   Goals of care: Advanced Directive information Advanced Directives 10/10/2020  Does Patient Have a Medical Advance Directive? No  Type of Advance Directive -  Does patient want to make changes to medical advance directive? No - Patient declined  Copy of [place] in Chart? -  Would patient like information on creating a medical advance directive? -     Chief Complaint  Patient presents with  . Discharge Note    Discharge Visit     HPI:  Pt is a 75 y.o. male who is for discharge home today, 10/10/20, with Home health PT and OT.  He was admitted to [facility] on 10/04/20 post hospital admission 09/30/20 to 10/04/20. He has a PMH of atrial fibrillation, GERD, CAD S/P CABG and coronary stent placement, hypertension, hyperlipidemia, type 2 diabetes mellitus and spinal stenosis. Of note, he was having a short-term rehabilitation at [facility] post hospitalization 09/17/20 to 09/25/20 for syncopal episode after micturition and a day of vomiting and diarrhea. He was treated for gastroparesis and was discharged on Protonix, Reglan and Sucralfate. He was brought to ED on 09/30/20 due to chest pain that radiated to the jaw down to his left arm. Chest x-ray was negative for pneumonia or pulmonary edema. EKG on admission was notable for ST elevation in inferior  lead. Troponins trended flat. He was given morphine in the ED. Cardiology was consulted and did not feel any further intervention was needed. His chest pain was likely musculoskeletal in nature. Cardiology did not determine that this was ischemic in nature. This was possibly due to multifactorial causes with potential GI component given extensive GERD history. Pain management was deescalated to Oxycodone 2.5 mg and discharged on for 7 days.  Patient was admitted to this facility for short-term rehabilitation after the patient's recent hospitalization.  Patient has completed SNF rehabilitation and therapy has cleared the patient for discharge.   Past Medical History:  Diagnosis Date  . 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 Back injuries   . Bladder cancer ([place])   . Cancer ([place])   . Carotid artery disease (HCC)    > 75% bilateral ICA stenoses on 01/2013 CT  . Chronic pain syndrome   . Coronary artery disease    s/p CABG ~ 2007  . 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 GERD (gastroesophageal reflux disease)   . Heparin allergy    Bleeding  . History of tobacco use   . Hx of CABG   . Hx of cardiac cath   . Hyperlipidemia   . Hypertension   . Ischemic cardiomyopathy   . Paroxysmal atrial fibrillation (HCC)    On Xarelto anticoagulation  . Type 2 diabetes mellitus (HCC)    A1C 6.8% in 01/2013   Past Surgical History:  Procedure Laterality Date  . CARDIAC CATHETERIZATION    . CORONARY ARTERY BYPASS GRAFT  2007  . CORONARY STENT INTERVENTION N/A 07/13/2020   Procedure: CORONARY STENT INTERVENTION;  Surgeon:  End, Harrell Gave, MD;  Location: [place] CV LAB;  Service: Cardiovascular;  Laterality: N/A;  . ESOPHAGOGASTRODUODENOSCOPY (EGD) WITH PROPOFOL N/A 09/20/2020   Procedure: ESOPHAGOGASTRODUODENOSCOPY (EGD) WITH PROPOFOL;  Surgeon: Ladene Artist, MD;  Location: [facility] ENDOSCOPY;  Service: Endoscopy;  Laterality: N/A;  . LEFT HEART CATH AND CORS/GRAFTS ANGIOGRAPHY N/A  07/13/2020   Procedure: LEFT HEART CATH AND CORS/GRAFTS ANGIOGRAPHY;  Surgeon: Minna Merritts, MD;  Location: [place] CV LAB;  Service: Cardiovascular;  Laterality: N/A;    Allergies  Allergen Reactions  . 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 Aspirin Swelling  . Lisinopril Anaphylaxis  . Angiotensin Receptor Blockers     Never taken but PMH of angioedema with ACE-I ( Lisinopril)  . Heparin Other (See Comments)    Reaction:  Bleeding     Outpatient Encounter Medications as of 10/10/2020  Medication Sig  . acetaminophen (TYLENOL) 325 MG tablet Take 2 tablets (650 mg total) by mouth every 6 (six) hours.  Marland Kitchen atorvastatin (LIPITOR) 80 MG tablet Take 80 mg by mouth daily.  . carvedilol (COREG) 6.25 MG tablet Take 1 tablet (6.25 mg total) by mouth 2 (two) times daily with a meal.  . clopidogrel (PLAVIX) 75 MG tablet Take 75 mg by mouth every morning.  . DULoxetine (CYMBALTA) 30 MG capsule Take 30 mg by mouth daily.  . empagliflozin (JARDIANCE) 25 MG TABS tablet Take 25 mg by mouth daily.  Marland Kitchen ezetimibe (ZETIA) 10 MG tablet Take 1 tablet (10 mg total) by mouth daily.  Marland Kitchen gabapentin (NEURONTIN) 100 MG capsule Take 100 mg by mouth 2 (two) times daily.  . isosorbide mononitrate (IMDUR) 60 MG 24 hr tablet Take 1 tablet (60 mg total) by mouth daily.  . melatonin 5 MG TABS Take 5 mg by mouth at bedtime.  . metFORMIN (GLUCOPHAGE) 500 MG tablet Take 500 mg by mouth 2 (two) times daily with a meal.  . Multiple Vitamin (MULTIVITAMIN WITH MINERALS) TABS tablet Take 1 tablet by mouth daily.  . nitroGLYCERIN (NITROSTAT) 0.4 MG SL tablet Place 1 tablet (0.4 mg total) under the tongue every 5 (five) minutes as needed for chest pain.  . Nutritional Supplements (FEEDING SUPPLEMENT, GLUCERNA 1.2 CAL,) LIQD Take 237 mLs by mouth daily in the afternoon.  Marland Kitchen oxyCODONE (OXY IR/ROXICODONE) 5 MG immediate release tablet Take 0.5 tablets (2.5 mg total) by mouth 2 (two) times daily for 7 days.  . pantoprazole  (PROTONIX) 40 MG tablet Take 40 mg by mouth daily.  . polyethylene glycol (MIRALAX / GLYCOLAX) 17 g packet Take 17 g by mouth daily.  . rivaroxaban (XARELTO) 20 MG TABS tablet Take 1 tablet (20 mg total) by mouth daily with supper.  . senna-docusate (SENOKOT-S) 8.6-50 MG tablet Take 2 tablets by mouth 2 (two) times daily.  Marland Kitchen spironolactone (ALDACTONE) 25 MG tablet Take 1 tablet by mouth daily.  . sucralfate (CARAFATE) 1 g tablet Take 1 tablet (1 g total) by mouth 4 (four) times daily -  with meals and at bedtime.  . Tetrahydrozoline HCl (VISINE OP) Place 1 drop into both eyes daily as needed (dry red eyes).   No facility-administered encounter medications on file as of 10/10/2020.    Review of Systems  GENERAL: No change in appetite, no fatigue, no weight changes, no fever, chills or weakness MOUTH and THROAT: Denies oral discomfort, gingival pain or bleeding, pain from teeth or hoarseness   RESPIRATORY: no cough, SOB, DOE, wheezing, hemoptysis CARDIAC: No chest pain, edema or palpitations GI: No abdominal  pain, diarrhea, constipation, heart burn, nausea or vomiting GU: Denies dysuria, frequency, hematuria, incontinence, or discharge NEUROLOGICAL: Denies dizziness, syncope, numbness, or headache PSYCHIATRIC: Denies feelings of depression or anxiety. No report of hallucinations, insomnia, paranoia, or agitation   Immunization History  Administered Date(s) Administered  . DTaP 04/19/2008  . Dtap, Unspecified 04/19/2008  . Influenza-Unspecified 04/25/1997, 03/06/2019, 03/23/2020, 04/17/2020  . Janssen (J&J) SARS-COV-2 Vaccination 10/21/2019, 04/24/2020  . PPD Test 09/24/2016  . Pneumococcal Conjugate-13 04/19/2015  . Pneumococcal Polysaccharide-23 02/15/2008, 04/30/2016  . Td 04/25/1997, 10/27/2017  . Tdap 04/19/2008  . Zoster 07/30/2011  . Zoster Recombinat (Shingrix) 05/03/2020, 07/10/2020   Pertinent  Health Maintenance Due  Topic Date Due  . FOOT EXAM  Never done  .  OPHTHALMOLOGY EXAM  Never done  . URINE MICROALBUMIN  Never done  . COLONOSCOPY (Pts 45-18yrs Insurance coverage will need to be confirmed)  Never done  . INFLUENZA VACCINE  01/14/2021  . HEMOGLOBIN A1C  03/20/2021  . PNA vac Low Risk Adult  Completed   No flowsheet data found.   Vitals:   10/10/20 1140  BP: 110/78  Pulse: 83  Resp: 18  Temp: 97.8 F (36.6 C)  SpO2: 93%  Weight: 170 lb (77.1 kg)  Height: 6' (1.829 m)   Body mass index is 23.06 kg/m.  Physical Exam  GENERAL APPEARANCE: Well nourished. In no acute distress. Normal body habitus SKIN:  Skin is warm and dry.  MOUTH and THROAT: Lips are without lesions. Oral mucosa is moist and without lesions. Tongue is normal in shape, size, and color and without lesions RESPIRATORY: Breathing is even & unlabored, BS CTAB CARDIAC: RRR, no murmur,no extra heart sounds, no edema GI: Abdomen soft, normal BS, no masses, no tenderness EXTREMITIES:  Able to move X 4 extremities  NEUROLOGICAL: There is no tremor. Speech is clear. Alert and oriented X 3. PSYCHIATRIC:  Affect and behavior are appropriate  Labs reviewed: Recent Labs    07/19/20 0531 07/20/20 0508 08/07/20 1538 09/18/20 1943 09/19/20 0118 09/19/20 0836 09/20/20 0034 10/01/20 0239 10/02/20 0104 10/03/20 0135 10/04/20 0215  NA 133* 130*   < > 134*   < >  --    < > 133* 131* 133* 131*  K 4.0 3.7   < > 3.5   < >  --    < > 3.8 3.8 4.0 3.4*  CL 101 102   < > 103   < >  --    < > 104 100 99 98  CO2 24 23   < > 25   < >  --    < > 18* 19* 15* 18*  GLUCOSE 198* 154*   < > 184*   < >  --    < > 100* 116* 138* 138*  BUN 28* 28*   < > 21   < >  --    < > 11 16 22  25*  CREATININE 1.06 0.93   < > 1.31*   < >  --    < > 1.12 1.11 1.44* 1.34*  CALCIUM 9.3 9.0   < > 8.2*   < >  --    < > 8.9 9.2 9.4 9.2  MG 1.8 1.8   < >  --   --  1.8  --  1.7 1.8  --   --   PHOS 2.7 3.1  --  2.7  --   --   --   --   --   --   --    < > =  values in this interval not displayed.   Recent  Labs    09/19/20 0118 09/20/20 0034 10/01/20 0239  AST 16 19 18   ALT 16 17 20   ALKPHOS 59 68 91  BILITOT 1.8* 1.5* 2.6*  PROT 5.5* 6.0* 6.1*  ALBUMIN 2.9* 3.2* 3.3*   Recent Labs    09/06/20 0557 09/17/20 1450 09/18/20 0135 09/19/20 0118 09/19/20 0836 10/02/20 0104 10/03/20 0135 10/04/20 0215  WBC 8.3 15.9*   < > 9.9   < > 10.6* 10.6* 10.0  NEUTROABS 4.9 12.5*  --  5.7  --   --   --   --   HGB 14.6 20.1*   < > 13.9   < > 16.0 16.4 16.4  HCT 41.8 55.8*   < > 39.2   < > 46.4 48.7 47.0  MCV 95.0 92.4   < > 93.6   < > 95.5 97.6 95.3  PLT 161 247   < > 174   < > 275 303 264   < > = values in this interval not displayed.   Lab Results  Component Value Date   TSH 2.495 10/01/2020   Lab Results  Component Value Date   HGBA1C 6.9 (H) 09/18/2020   Lab Results  Component Value Date   CHOL 95 08/08/2020   HDL 42 08/08/2020   LDLCALC 29 08/08/2020   TRIG 121 08/08/2020   CHOLHDL 2.3 08/08/2020    Significant Diagnostic Results in last 30 days:  DG Chest 1 View  Result Date: 09/17/2020 CLINICAL DATA:  Fall, right shoulder pain. EXAM: CHEST  1 VIEW COMPARISON:  None. FINDINGS: Prior CABG. Heart and mediastinal contours are within normal limits. No focal opacities or effusions. No acute bony abnormality. No visible displaced rib fracture or pneumothorax. IMPRESSION: No active disease. Electronically Signed   By: Rolm Baptise M.D.   On: 09/17/2020 18:02   DG Ribs Unilateral Right  Result Date: 09/24/2020 CLINICAL DATA:  And lower right rib pain after a fall week ago. EXAM: RIGHT RIBS - 2 VIEW COMPARISON:  Chest x-ray dated September 17, 2020. CTA chest dated September 03, 2020. FINDINGS: No fracture or other bone lesions are seen involving the ribs. Unchanged right nephrolithiasis. IMPRESSION: 1. Negative right ribs. 2. Unchanged right nephrolithiasis. Electronically Signed   By: Titus Dubin M.D.   On: 09/24/2020 19:31   DG Lumbar Spine 2-3 Views  Result Date: 09/17/2020 CLINICAL  DATA:  Back pain, fall EXAM: LUMBAR SPINE - 2-3 VIEW COMPARISON:  None. FINDINGS: Partial fusion across the L3-4, L4-5 and L5-S1 disc spaces. Normal alignment. Moderate to advanced degenerative disc and facet disease diffusely. No fracture. IMPRESSION: Diffuse degenerative disc and facet disease. No acute bony abnormality. Electronically Signed   By: Rolm Baptise M.D.   On: 09/17/2020 18:01   DG Pelvis 1-2 Views  Result Date: 09/17/2020 CLINICAL DATA:  Fall, low back pain radiating to right side. EXAM: PELVIS - 1-2 VIEW COMPARISON:  None. FINDINGS: Symmetric degenerative changes in the hips. Degenerative changes in the visualized lower lumbar spine. No acute bony abnormality. Specifically, no fracture, subluxation, or dislocation. IMPRESSION: No acute bony abnormality. Electronically Signed   By: Rolm Baptise M.D.   On: 09/17/2020 18:00   DG Shoulder Right  Result Date: 09/17/2020 CLINICAL DATA:  Right shoulder pain EXAM: RIGHT SHOULDER - 2+ VIEW COMPARISON:  None. FINDINGS: There is no evidence of fracture or dislocation. There is diffuse osteopenia. Moderate AC and glenohumeral joint osteoarthritis is seen with joint  space loss. Mild overlying soft tissue swelling is seen. IMPRESSION: No acute osseous abnormality. Electronically Signed   By: Prudencio Pair M.D.   On: 09/17/2020 18:00   DG Forearm Left  Result Date: 09/19/2020 CLINICAL DATA:  Pain following recent fall EXAM: LEFT FOREARM - 2 VIEW COMPARISON:  None. FINDINGS: Frontal and lateral views were obtained. No appreciable fracture or dislocation. Joint spaces appear normal. No erosive change. IMPRESSION: No fracture or dislocation.  No appreciable arthropathy. Electronically Signed   By: Lowella Grip III M.D.   On: 09/19/2020 13:27   CT HEAD WO CONTRAST  Result Date: 09/18/2020 CLINICAL DATA:  Syncope EXAM: CT HEAD WITHOUT CONTRAST TECHNIQUE: Contiguous axial images were obtained from the base of the skull through the vertex without  intravenous contrast. COMPARISON:  08/07/2020 FINDINGS: Brain: No acute intracranial abnormality. Specifically, no hemorrhage, hydrocephalus, mass lesion, acute infarction, or significant intracranial injury. Vascular: No hyperdense vessel or unexpected calcification. Skull: No acute calvarial abnormality. Sinuses/Orbits: No acute findings Other: None IMPRESSION: No acute intracranial abnormality. Electronically Signed   By: Rolm Baptise M.D.   On: 09/18/2020 00:31   DG Chest Portable 1 View  Result Date: 09/30/2020 CLINICAL DATA:  Chest pain and shortness of breath. EXAM: PORTABLE CHEST 1 VIEW COMPARISON:  Chest x-ray dated 09/17/2020. FINDINGS: Heart size and mediastinal contours are stable. Median sternotomy wires appear intact and stable in alignment. Lungs are clear. No pleural effusion or pneumothorax is seen. IMPRESSION: No active disease. No evidence of pneumonia or pulmonary edema. Electronically Signed   By: Franki Cabot M.D.   On: 09/30/2020 15:22   DG Humerus Left  Result Date: 09/19/2020 CLINICAL DATA:  Pain following fall EXAM: LEFT HUMERUS - 2+ VIEW COMPARISON:  May 03, 2012 FINDINGS: Frontal and lateral views were obtained. There is screw and plate fixation through a previous fracture of the proximal left humerus. There is remodeling with bony overgrowth in this area. No acute fracture or dislocation. No appreciable joint space narrowing or erosion. IMPRESSION: Postoperative fixation for prior fracture proximal left humerus with bony overgrowth and remodeling in the proximal left humerus. No acute fracture or dislocation. No appreciable joint space narrowing or erosion. Electronically Signed   By: Lowella Grip III M.D.   On: 09/19/2020 13:26   DG Humerus Right  Result Date: 09/17/2020 CLINICAL DATA:  Fall, right shoulder pain EXAM: RIGHT HUMERUS - 2+ VIEW COMPARISON:  None. FINDINGS: Moderate to advanced degenerative changes in the right [facility] joint with joint space narrowing and  spurring. Early degenerative changes in the glenohumeral joint. No acute bony abnormality. Specifically, no fracture, subluxation, or dislocation. IMPRESSION: Degenerative changes in the right shoulder. No acute bony abnormality. Electronically Signed   By: Rolm Baptise M.D.   On: 09/17/2020 18:01   CT Angio Abd/Pel w/ and/or w/o  Addendum Date: 09/25/2020   ADDENDUM REPORT: 09/25/2020 08:09 ADDENDUM: No visible lower rib fracture or focal pathology. Electronically Signed   By: Rolm Baptise M.D.   On: 09/25/2020 08:09   Addendum Date: 09/19/2020   ADDENDUM REPORT: 09/19/2020 01:15 ADDENDUM: There is a contusion involving the patient's right flank. There is a small 0.7 cm hyperattenuating area in the posterior right flank (axial series 5, image 38). This was not visualized on the patient's prior study from September 03, 2020. There is an additional smaller hyperattenuating focus more superiorly measuring approximately 6 mm (axial series 5, image 26). Both of these areas are concerning for areas of active extravasation versus is small  pseudoaneurysms in the setting of trauma. Exam is limited by lack of a venous phase. These results were called by telephone at the time of interpretation on 09/19/2020 at 1:14 am to provider Jeannine Kitten , who verbally acknowledged these results. Electronically Signed   By: Constance Holster M.D.   On: 09/19/2020 01:15   Result Date: 09/25/2020 CLINICAL DATA:  Upper GI bleed post endoscopy. EXAM: CTA ABDOMEN AND PELVIS WITHOUT AND WITH CONTRAST TECHNIQUE: Multidetector CT imaging of the abdomen and pelvis was performed using the standard protocol during bolus administration of intravenous contrast. Multiplanar reconstructed images and MIPs were obtained and reviewed to evaluate the vascular anatomy. CONTRAST:  171mL OMNIPAQUE IOHEXOL 350 MG/ML SOLN COMPARISON:  September 03, 2020 FINDINGS: VASCULAR Aorta: Atherosclerotic changes are noted without evidence for an aneurysm. Celiac: There is variant  celiac artery anatomy. The common hepatic artery arises directly from the aorta. There is significant narrowing of the origin of the common hepatic artery. The splenic artery arises directly from the aorta. There is no significant narrowing. SMA: There is mild-to-moderate narrowing involving the SMA. Renals: There is mild narrowing of the left renal artery. There is mild narrowing of the right renal artery. IMA: There is moderate narrowing at the origin of the IMA. Inflow: There is a right common iliac artery stent that is patent. There is mild narrowing of the distal right common iliac artery. Proximal Outflow: Bilateral common femoral and visualized portions of the superficial and profunda femoral arteries are patent without evidence of aneurysm, dissection, vasculitis or significant stenosis. Veins: No obvious venous abnormality within the limitations of this arterial phase study. Review of the MIP images confirms the above findings. NON-VASCULAR Lower chest: There is atelectasis at the lung bases.The heart size is mildly enlarged. There is reflux of contrast into the IVC. Hepatobiliary: The liver is normal. Normal gallbladder.There is no biliary ductal dilation. Pancreas: The pancreatic duct is slightly prominent. Spleen: Unremarkable. Adrenals/Urinary Tract: --Adrenal glands: Unremarkable. --Right kidney/ureter: Again noted is a nonobstructing stone in the right kidney measuring approximately 1.4 cm. There is no hydronephrosis. --Left kidney/ureter: No hydronephrosis or radiopaque kidney stones. --Urinary bladder: Unremarkable. Stomach/Bowel: --Stomach/Duodenum: There is hyperdense material within stomach. --Small bowel: Unremarkable. --Colon: Unremarkable. --Appendix: Normal. Vascular/Lymphatic: Atherosclerotic calcification is present within the non-aneurysmal abdominal aorta, without hemodynamically significant stenosis. There is mild-to-moderate narrowing of the SMA both in the mid and distal sections.  There is mild narrowing of the left renal artery. --No retroperitoneal lymphadenopathy. --No mesenteric lymphadenopathy. --No pelvic or inguinal lymphadenopathy. Reproductive: Unremarkable Other: No ascites or free air. The abdominal wall is normal. Musculoskeletal. There are advanced degenerative changes throughout the lumbar spine. No acute fracture. IMPRESSION: 1. Exam is significantly limited by the lack of a noncontrast phase and a the late venous phase. 2. While there is no definite active arterial bleeding, there is hyperdense material within the stomach which is nonspecific. This could represent extravasated contrast or hyperdense food material. If there is clinical concern for ongoing GI bleeding, a repeat study should be performed with a noncontrast, arterial, and venous phase. 3. Nonobstructing right-sided nephrolithiasis. 4. Chronic findings as detailed above. 5.  Aortic Atherosclerosis (ICD10-I70.0). Electronically Signed: By: Constance Holster M.D. On: 09/19/2020 00:19    Assessment/Plan  1. Left-sided chest pain -   Chest x-ray was negative for pneumonia -   Cardiology was consulted and did not feel any further intervention needed -Chest pain was thought to be likely musculoskeletal in nature and with potential GI component given extensive  GERD history -   Oxycodone 2.5 mg twice a day was given X 7 days, discontinue on 10/11/20 -  Follow up with cardiology  2. DM (diabetes mellitus), type 2 with peripheral vascular complications (HCC) Lab Results  Component Value Date   HGBA1C 6.9 (H) 09/18/2020   - empagliflozin (JARDIANCE) 25 MG TABS tablet; Take 1 tablet (25 mg total) by mouth daily.  Dispense: 30 tablet; Refill: 0 - metFORMIN (GLUCOPHAGE) 500 MG tablet; Take 1 tablet (500 mg total) by mouth 2 (two) times daily with a meal.  Dispense: 60 tablet; Refill: 0  3. AKI (acute kidney injury) [facility]) Lab Results  Component Value Date   NA 131 (L) 10/04/2020   K 3.4 (L) 10/04/2020    CO2 18 (L) 10/04/2020   GLUCOSE 138 (H) 10/04/2020   BUN 25 (H) 10/04/2020   CREATININE 1.34 (H) 10/04/2020   CALCIUM 9.2 10/04/2020   GFRNONAA 55 (L) 10/04/2020   GFRAA >60 12/13/2019   -  Follow up with PCP for monitoring  4. Essential hypertension - spironolactone (ALDACTONE) 25 MG tablet; Take 1 tablet (25 mg total) by mouth daily.  Dispense: 30 tablet; Refill: 0 -  Continue Coreg  5. Gastroparesis diabeticorum (HCC) - pantoprazole (PROTONIX) 40 MG tablet; Take 1 tablet (40 mg total) by mouth daily.  Dispense: 30 tablet; Refill: 0 - sucralfate (CARAFATE) 1 g tablet; Take 1 tablet (1 g total) by mouth 4 (four) times daily -  with meals and at bedtime.  Dispense: 120 tablet; Refill: 0  6. PAF (paroxysmal atrial fibrillation) (HCC) - rivaroxaban (XARELTO) 20 MG TABS tablet; Take 1 tablet (20 mg total) by mouth daily with supper.  Dispense: 30 tablet; Refill: 0 - carvedilol (COREG) 6.25 MG tablet; Take 1 tablet (6.25 mg total) by mouth 2 (two) times daily with a meal.  Dispense: 60 tablet; Refill: 0  7. Coronary artery disease involving native coronary artery of native heart without angina pectoris - nitroGLYCERIN (NITROSTAT) 0.4 MG SL tablet; Place 1 tablet (0.4 mg total) under the tongue every 5 (five) minutes as needed for chest pain.  Dispense: 30 tablet; Refill: 0 - clopidogrel (PLAVIX) 75 MG tablet; Take 1 tablet (75 mg total) by mouth every morning.  Dispense: 30 tablet; Refill: 0 - ezetimibe (ZETIA) 10 MG tablet; Take 1 tablet (10 mg total) by mouth daily.  Dispense: 30 tablet; Refill: 0 - isosorbide mononitrate (IMDUR) 60 MG 24 hr tablet; Take 1 tablet (60 mg total) by mouth daily.  Dispense: 30 tablet; Refill: 0 - atorvastatin (LIPITOR) 80 MG tablet; Take 1 tablet (80 mg total) by mouth daily.  Dispense: 30 tablet; Refill: 0  8. Chronic pain syndrome - gabapentin (NEURONTIN) 100 MG capsule; Take 1 capsule (100 mg total) by mouth 2 (two) times daily.  Dispense: 60 capsule;  Refill: 0 - DULoxetine (CYMBALTA) 30 MG capsule; Take 1 capsule (30 mg total) by mouth daily.  Dispense: 30 capsule; Refill: 0     I have filled out patient's discharge paperwork and written prescriptions.  Patient will receive home health PT and OT.  DME provided: None  Total discharge time: Greater than 30 minutes Greater than 50% was spent in counseling and coordination of care.   Discharge time involved coordination of the discharge process with social worker, nursing staff and therapy department. Medical justification for home health services verified.    Kenard Gower, DNP, MSN, FNP-BC [facility] [phone] (Monday-Friday 8:00 a.m. - 5:00 p.m.) [phone] (after hours)

## 2020-10-11 MED ORDER — RIVAROXABAN 20 MG PO TABS: 20.0000 mg | ORAL_TABLET | Freq: Every day | ORAL | 0 refills | Status: DC

## 2020-10-11 MED ORDER — GABAPENTIN 100 MG PO CAPS: 100.0000 mg | ORAL_CAPSULE | Freq: Two times a day (BID) | ORAL | 0 refills | Status: DC

## 2020-10-11 MED ORDER — NITROGLYCERIN 0.4 MG SL SUBL: 0.4000 mg | SUBLINGUAL_TABLET | SUBLINGUAL | 0 refills | Status: AC | PRN

## 2020-10-11 MED ORDER — SPIRONOLACTONE 25 MG PO TABS: 1.0000 | ORAL_TABLET | Freq: Every day | ORAL | 0 refills | Status: DC

## 2020-10-11 MED ORDER — CARVEDILOL 6.25 MG PO TABS: 6.2500 mg | ORAL_TABLET | Freq: Two times a day (BID) | ORAL | 0 refills | Status: AC

## 2020-10-11 MED ORDER — DULOXETINE HCL 30 MG PO CPEP: 30.0000 mg | ORAL_CAPSULE | Freq: Every day | ORAL | 0 refills | Status: DC

## 2020-10-11 MED ORDER — ISOSORBIDE MONONITRATE ER 60 MG PO TB24: 60.0000 mg | ORAL_TABLET | Freq: Every day | ORAL | 0 refills | Status: DC

## 2020-10-11 MED ORDER — MELATONIN 5 MG PO TABS: 5.0000 mg | ORAL_TABLET | Freq: Every day | ORAL | 0 refills | Status: DC

## 2020-10-11 MED ORDER — CLOPIDOGREL BISULFATE 75 MG PO TABS: 75.0000 mg | ORAL_TABLET | Freq: Every morning | ORAL | 0 refills | Status: AC

## 2020-10-11 MED ORDER — PANTOPRAZOLE SODIUM 40 MG PO TBEC: 40.0000 mg | DELAYED_RELEASE_TABLET | Freq: Every day | ORAL | 0 refills | Status: AC

## 2020-10-11 MED ORDER — METFORMIN HCL 500 MG PO TABS: 500.0000 mg | ORAL_TABLET | Freq: Two times a day (BID) | ORAL | 0 refills | Status: DC

## 2020-10-11 MED ORDER — SUCRALFATE 1 G PO TABS: 1.0000 g | ORAL_TABLET | Freq: Three times a day (TID) | ORAL | 0 refills | Status: AC

## 2020-10-11 MED ORDER — EMPAGLIFLOZIN 25 MG PO TABS: 25.0000 mg | ORAL_TABLET | Freq: Every day | ORAL | 0 refills | Status: AC

## 2020-10-11 MED ORDER — EZETIMIBE 10 MG PO TABS: 10.0000 mg | ORAL_TABLET | Freq: Every day | ORAL | 0 refills | Status: AC

## 2020-10-11 MED ORDER — ATORVASTATIN CALCIUM 80 MG PO TABS: 80.0000 mg | ORAL_TABLET | Freq: Every day | ORAL | 0 refills | Status: AC

## 2020-10-18 ENCOUNTER — Ambulatory Visit: Payer: Self-pay | Admitting: Nurse Practitioner

## 2020-10-31 NOTE — Progress Notes (Deleted)
Cardiology Office Note   Date:  10/31/2020   ID:  MYREON WIMER, DOB 10-03-1945, MRN [id number]  PCP:  [facility]  Cardiologist:  Dr. Saunders Revel  No chief complaint on file.    History of Present Illness: Bryan Scott is a 75 y.o. male who presents for ongoing assessment and management of coronary artery disease.  Status post left heart cath on 07/13/2020 revealing multivessel coronary and bypass graft disease.  Culprit lesion appeared to be 95% stenotic SVG to RCA.  He had a successful PCI to SVG to RCA using resolute Onyx 3.5 x 18 mm drug-eluting stent with 0% residual stenosis and TIMI-3 flow.   He has a history of CABG in 2006, PCI with DES to SVG to RCA 07/13/2020, paroxysmal atrial fibrillation on Xarelto, hypertension, hyperlipidemia, carotid artery disease, with other history to include type 2 diabetes, cervical spine stenosis status post C5 C7 ACDF, chronic pain syndrome, with history of tobacco abuse.  After discharge in April 2022 he was sent to skilled nursing facility for rehabilitation.  He was admitted to [facility] rehabilitation on 10/04/2020.  Past Medical History:  Diagnosis Date  . 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 Back injuries   . Bladder cancer ([place])   . Cancer ([place])   . Carotid artery disease (HCC)    > 75% bilateral ICA stenoses on 01/2013 CT  . Chronic pain syndrome   . Coronary artery disease    s/p CABG ~ 2007  . 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 GERD (gastroesophageal reflux disease)   . Heparin allergy    Bleeding  . History of tobacco use   . Hx of CABG   . Hx of cardiac cath   . Hyperlipidemia   . Hypertension   . Ischemic cardiomyopathy   . Paroxysmal atrial fibrillation (HCC)    On Xarelto anticoagulation  . Type 2 diabetes mellitus (HCC)    A1C 6.8% in 01/2013    Past Surgical History:  Procedure Laterality Date  . CARDIAC CATHETERIZATION    . CORONARY ARTERY BYPASS GRAFT  2007  . CORONARY STENT  INTERVENTION N/A 07/13/2020   Procedure: CORONARY STENT INTERVENTION;  Surgeon: Nelva Bush, MD;  Location: [facility];  Service: Cardiovascular;  Laterality: N/A;  . ESOPHAGOGASTRODUODENOSCOPY (EGD) WITH PROPOFOL N/A 09/20/2020   Procedure: ESOPHAGOGASTRODUODENOSCOPY (EGD) WITH PROPOFOL;  Surgeon: Ladene Artist, MD;  Location: [facility] ENDOSCOPY;  Service: Endoscopy;  Laterality: N/A;  . LEFT HEART CATH AND CORS/GRAFTS ANGIOGRAPHY N/A 07/13/2020   Procedure: LEFT HEART CATH AND CORS/GRAFTS ANGIOGRAPHY;  Surgeon: Minna Merritts, MD;  Location: [facility];  Service: Cardiovascular;  Laterality: N/A;     Current Outpatient Medications  Medication Sig Dispense Refill  . acetaminophen (TYLENOL) 325 MG tablet Take 2 tablets (650 mg total) by mouth every 6 (six) hours.    Marland Kitchen atorvastatin (LIPITOR) 80 MG tablet Take 1 tablet (80 mg total) by mouth daily. 30 tablet 0  . carvedilol (COREG) 6.25 MG tablet Take 1 tablet (6.25 mg total) by mouth 2 (two) times daily with a meal. 60 tablet 0  . clopidogrel (PLAVIX) 75 MG tablet Take 1 tablet (75 mg total) by mouth every morning. 30 tablet 0  . DULoxetine (CYMBALTA) 30 MG capsule Take 1 capsule (30 mg total) by mouth daily. 30 capsule 0  . empagliflozin (JARDIANCE) 25 MG TABS tablet Take 1 tablet (25 mg total) by mouth daily. 30 tablet 0  .  ezetimibe (ZETIA) 10 MG tablet Take 1 tablet (10 mg total) by mouth daily. 30 tablet 0  . gabapentin (NEURONTIN) 100 MG capsule Take 1 capsule (100 mg total) by mouth 2 (two) times daily. 60 capsule 0  . isosorbide mononitrate (IMDUR) 60 MG 24 hr tablet Take 1 tablet (60 mg total) by mouth daily. 30 tablet 0  . melatonin 5 MG TABS Take 1 tablet (5 mg total) by mouth at bedtime. 30 tablet 0  . metFORMIN (GLUCOPHAGE) 500 MG tablet Take 1 tablet (500 mg total) by mouth 2 (two) times daily with a meal. 60 tablet 0  . Multiple Vitamin (MULTIVITAMIN WITH MINERALS) TABS tablet Take 1 tablet by mouth daily. 30  tablet 0  . nitroGLYCERIN (NITROSTAT) 0.4 MG SL tablet Place 1 tablet (0.4 mg total) under the tongue every 5 (five) minutes as needed for chest pain. 30 tablet 0  . Nutritional Supplements (FEEDING SUPPLEMENT, GLUCERNA 1.2 CAL,) LIQD Take 237 mLs by mouth daily in the afternoon.    . pantoprazole (PROTONIX) 40 MG tablet Take 1 tablet (40 mg total) by mouth daily. 30 tablet 0  . polyethylene glycol (MIRALAX / GLYCOLAX) 17 g packet Take 17 g by mouth daily. 14 each 0  . rivaroxaban (XARELTO) 20 MG TABS tablet Take 1 tablet (20 mg total) by mouth daily with supper. 30 tablet 0  . senna-docusate (SENOKOT-S) 8.6-50 MG tablet Take 2 tablets by mouth 2 (two) times daily. 100 tablet 0  . spironolactone (ALDACTONE) 25 MG tablet Take 1 tablet (25 mg total) by mouth daily. 30 tablet 0  . sucralfate (CARAFATE) 1 g tablet Take 1 tablet (1 g total) by mouth 4 (four) times daily -  with meals and at bedtime. 120 tablet 0  . Tetrahydrozoline HCl (VISINE OP) Place 1 drop into both eyes daily as needed (dry red eyes).     No current facility-administered medications for this visit.    Allergies:   Aspergum [aspirin], Aspirin, Lisinopril, Angiotensin receptor blockers, and Heparin    Social History:  The patient  reports that he has quit smoking. His smoking use included cigarettes. He has a 30.00 pack-year smoking history. He has never used smokeless tobacco. He reports previous alcohol use. He reports that he does not use drugs.   Family History:  The patient's family history includes CVA in his mother; Heart disease (age of onset: 97) in his mother; Seizures (age of onset: 80) in his father.    ROS: All other systems are reviewed and negative. Unless otherwise mentioned in H&P    PHYSICAL EXAM: VS:  There were no vitals taken for this visit. , BMI There is no height or weight on file to calculate BMI. GEN: Well nourished, well developed, in no acute distress HEENT: normal Neck: no JVD, carotid bruits,  or masses Cardiac: ***RRR; no murmurs, rubs, or gallops,no edema  Respiratory:  Clear to auscultation bilaterally, normal work of breathing GI: soft, nontender, nondistended, + BS MS: no deformity or atrophy Skin: warm and dry, no rash Neuro:  Strength and sensation are intact Psych: euthymic mood, full affect   EKG:  EKG {ACTION; IS/IS [id number]} ordered today. The ekg ordered today demonstrates ***   Recent Labs: 08/07/2020: B Natriuretic Peptide 36.4 10/01/2020: ALT 20; TSH 2.495 10/02/2020: Magnesium 1.8 10/04/2020: BUN 25; Creatinine, Ser 1.34; Hemoglobin 16.4; Platelets 264; Potassium 3.4; Sodium 131    Lipid Panel    Component Value Date/Time   CHOL 95 08/08/2020 0542   CHOL  142 10/23/2013 0740   TRIG 121 08/08/2020 0542   TRIG 218 (H) 10/23/2013 0740   HDL 42 08/08/2020 0542   HDL 37 (L) 10/23/2013 0740   CHOLHDL 2.3 08/08/2020 0542   VLDL 24 08/08/2020 0542   VLDL 44 (H) 10/23/2013 0740   LDLCALC 29 08/08/2020 0542   LDLCALC 61 10/23/2013 0740      Wt Readings from Last 3 Encounters:  10/10/20 170 lb (77.1 kg)  10/09/20 170 lb 3.2 oz (77.2 kg)  10/05/20 169 lb 12 oz (77 kg)      Other studies Reviewed: Additional studies/ records that were reviewed today include: ***. Review of the above records demonstrates: ***   ASSESSMENT AND PLAN:  1.  ***   Current medicines are reviewed at length with the patient today.  I have spent *** dedicated to the care of this patient on the date of this encounter to include pre-visit review of records, assessment, management and diagnostic testing,with shared decision making.  Labs/ tests ordered today include: *** Phill Myron. [place] Pugh, ANP, AACC   10/31/2020 6:00 PM    [address] Office [phone] Fax [phone]  Notice: This dictation was prepared with Dragon dictation along with smaller phrase technology. Any transcriptional errors that result from  this process are unintentional and may not be corrected upon review.

## 2020-11-02 ENCOUNTER — Ambulatory Visit: Payer: Self-pay | Admitting: Adult Health

## 2021-02-16 ENCOUNTER — Observation Stay
Admission: EM | Admit: 2021-02-16 | Discharge: 2021-02-19 | Disposition: A | Discharge status: Home / Self Care | Payer: No Typology Code available for payment source | Attending: Internal Medicine | Admitting: Internal Medicine

## 2021-02-16 ENCOUNTER — Emergency Department: Payer: No Typology Code available for payment source

## 2021-02-16 ENCOUNTER — Other Ambulatory Visit: Payer: Self-pay

## 2021-02-16 DIAGNOSIS — R0602 Shortness of breath: Secondary | ICD-10-CM | POA: Insufficient documentation

## 2021-02-16 DIAGNOSIS — I10 Essential (primary) hypertension: Secondary | ICD-10-CM | POA: Insufficient documentation

## 2021-02-16 DIAGNOSIS — I2510 Atherosclerotic heart disease of native coronary artery without angina pectoris: Secondary | ICD-10-CM | POA: Diagnosis present

## 2021-02-16 DIAGNOSIS — E119 Type 2 diabetes mellitus without complications: Secondary | ICD-10-CM | POA: Diagnosis not present

## 2021-02-16 DIAGNOSIS — Z7901 Long term (current) use of anticoagulants: Secondary | ICD-10-CM | POA: Insufficient documentation

## 2021-02-16 DIAGNOSIS — Z87891 Personal history of nicotine dependence: Secondary | ICD-10-CM | POA: Insufficient documentation

## 2021-02-16 DIAGNOSIS — N289 Disorder of kidney and ureter, unspecified: Secondary | ICD-10-CM

## 2021-02-16 DIAGNOSIS — Z79899 Other long term (current) drug therapy: Secondary | ICD-10-CM | POA: Insufficient documentation

## 2021-02-16 DIAGNOSIS — Z7984 Long term (current) use of oral hypoglycemic drugs: Secondary | ICD-10-CM | POA: Insufficient documentation

## 2021-02-16 DIAGNOSIS — Z8551 Personal history of malignant neoplasm of bladder: Secondary | ICD-10-CM | POA: Diagnosis not present

## 2021-02-16 DIAGNOSIS — I480 Paroxysmal atrial fibrillation: Secondary | ICD-10-CM | POA: Diagnosis present

## 2021-02-16 DIAGNOSIS — R079 Chest pain, unspecified: Secondary | ICD-10-CM | POA: Diagnosis present

## 2021-02-16 DIAGNOSIS — K219 Gastro-esophageal reflux disease without esophagitis: Secondary | ICD-10-CM | POA: Diagnosis present

## 2021-02-16 DIAGNOSIS — Z7902 Long term (current) use of antithrombotics/antiplatelets: Secondary | ICD-10-CM | POA: Diagnosis not present

## 2021-02-16 DIAGNOSIS — R0789 Other chest pain: Secondary | ICD-10-CM | POA: Diagnosis not present

## 2021-02-16 DIAGNOSIS — Z951 Presence of aortocoronary bypass graft: Secondary | ICD-10-CM | POA: Insufficient documentation

## 2021-02-16 LAB — BASIC METABOLIC PANEL
Anion gap: 10 (ref 5–15)
BUN: 21 mg/dL (ref 8–23)
CO2: 23 mmol/L (ref 22–32)
Calcium: 9.6 mg/dL (ref 8.9–10.3)
Chloride: 103 mmol/L (ref 98–111)
Creatinine, Ser: 1.4 mg/dL — ABNORMAL HIGH (ref 0.61–1.24)
GFR, Estimated: 52 mL/min — ABNORMAL LOW (ref >60)
Glucose, Bld: 184 mg/dL — ABNORMAL HIGH (ref 70–99)
Potassium: 4.1 mmol/L (ref 3.5–5.1)
Sodium: 136 mmol/L (ref 135–145)

## 2021-02-16 LAB — CBC WITH DIFFERENTIAL/PLATELET
Abs Immature Granulocytes: 0.05 10*3/uL (ref 0.00–0.07)
Basophils Absolute: 0 10*3/uL (ref 0.0–0.1)
Basophils Relative: 0 %
Eosinophils Absolute: 0.1 10*3/uL (ref 0.0–0.5)
Eosinophils Relative: 1 %
HCT: 47.8 % (ref 39.0–52.0)
Hemoglobin: 16.7 g/dL (ref 13.0–17.0)
Immature Granulocytes: 1 %
Lymphocytes Relative: 13 %
Lymphs Abs: 1.4 10*3/uL (ref 0.7–4.0)
MCH: 34.1 pg — ABNORMAL HIGH (ref 26.0–34.0)
MCHC: 34.9 g/dL (ref 30.0–36.0)
MCV: 97.6 fL (ref 80.0–100.0)
Monocytes Absolute: 0.4 10*3/uL (ref 0.1–1.0)
Monocytes Relative: 4 %
Neutro Abs: 8.7 10*3/uL — ABNORMAL HIGH (ref 1.7–7.7)
Neutrophils Relative %: 81 %
Platelets: 221 10*3/uL (ref 150–400)
RBC: 4.9 MIL/uL (ref 4.22–5.81)
RDW: 13.5 % (ref 11.5–15.5)
WBC: 10.6 10*3/uL — ABNORMAL HIGH (ref 4.0–10.5)
nRBC: 0 % (ref 0.0–0.2)

## 2021-02-16 LAB — TROPONIN I (HIGH SENSITIVITY)
Troponin I (High Sensitivity): 12 ng/L (ref <18)
Troponin I (High Sensitivity): 8 ng/L (ref <18)

## 2021-02-16 IMAGING — DX DG CHEST 1V PORT
2 series · 2 of 2 positions shown · non-contrast
Comparison: One-view chest x-ray [DATE].

CLINICAL DATA: Chest pain.

EXAM:
PORTABLE CHEST 1 VIEW

[chest ap (1 of 2)]
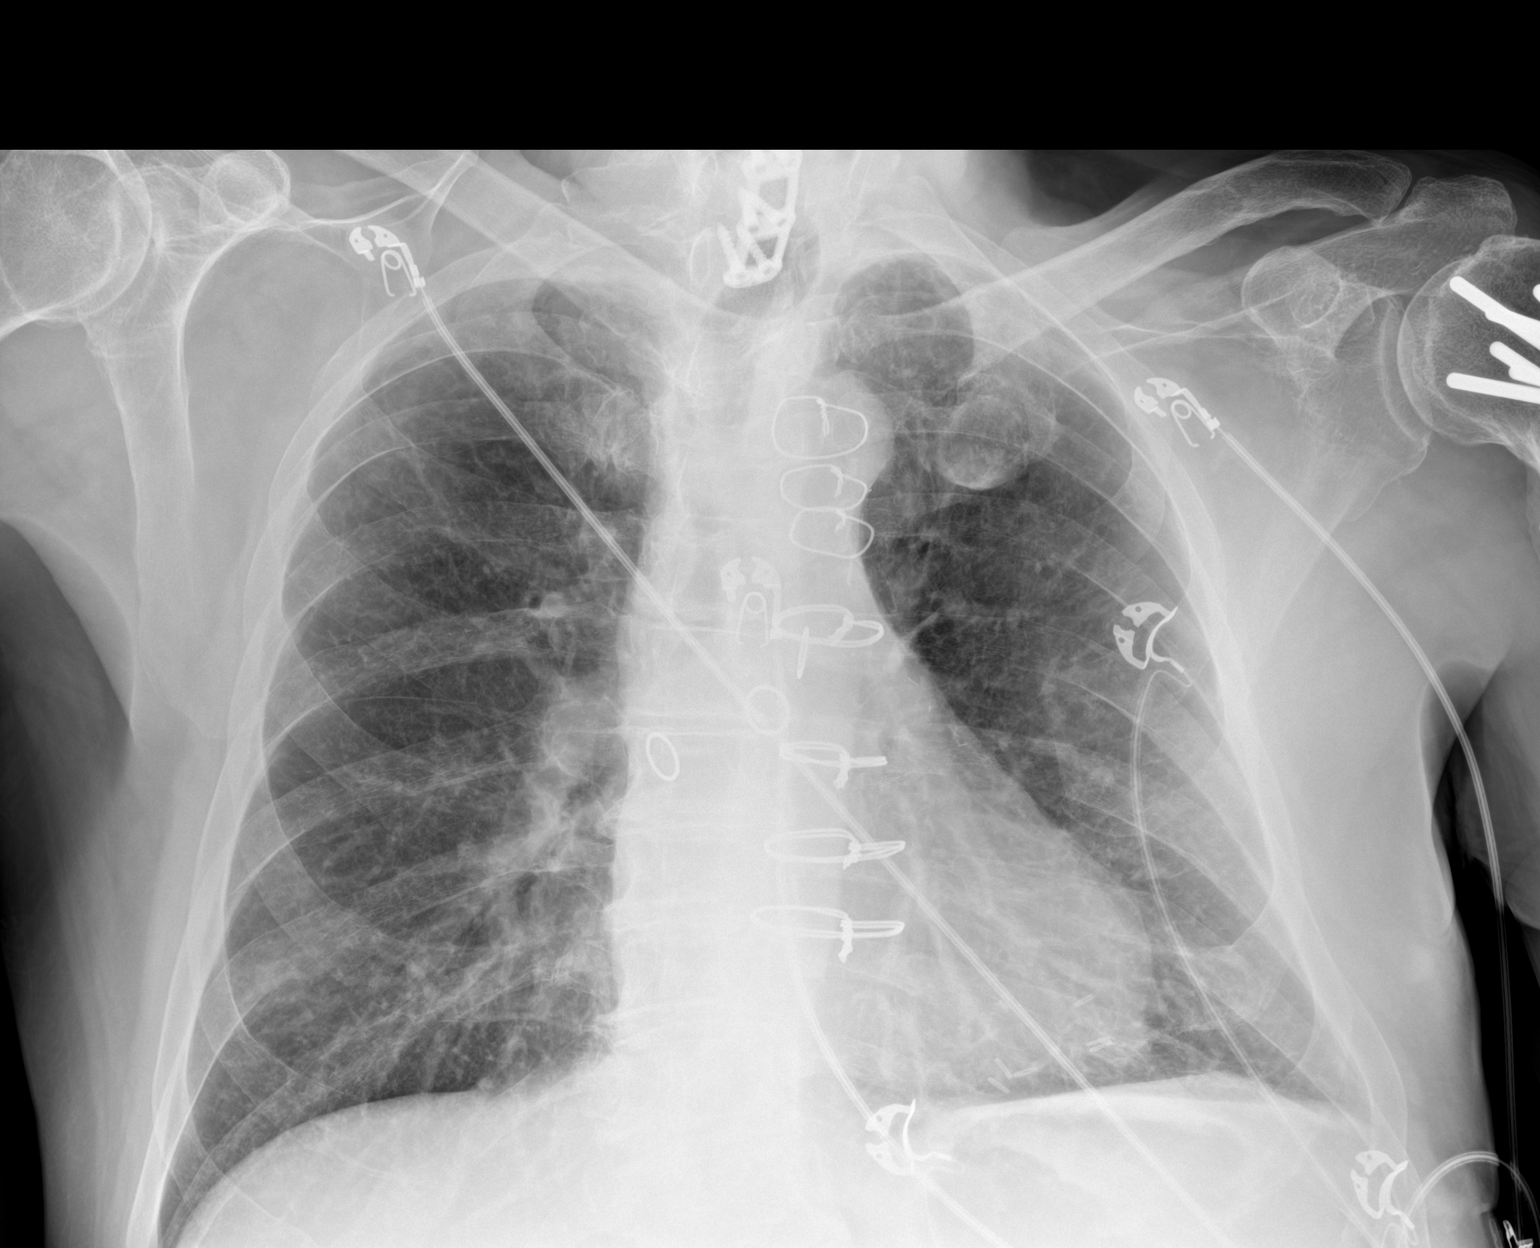

[chest ap (2 of 2)]
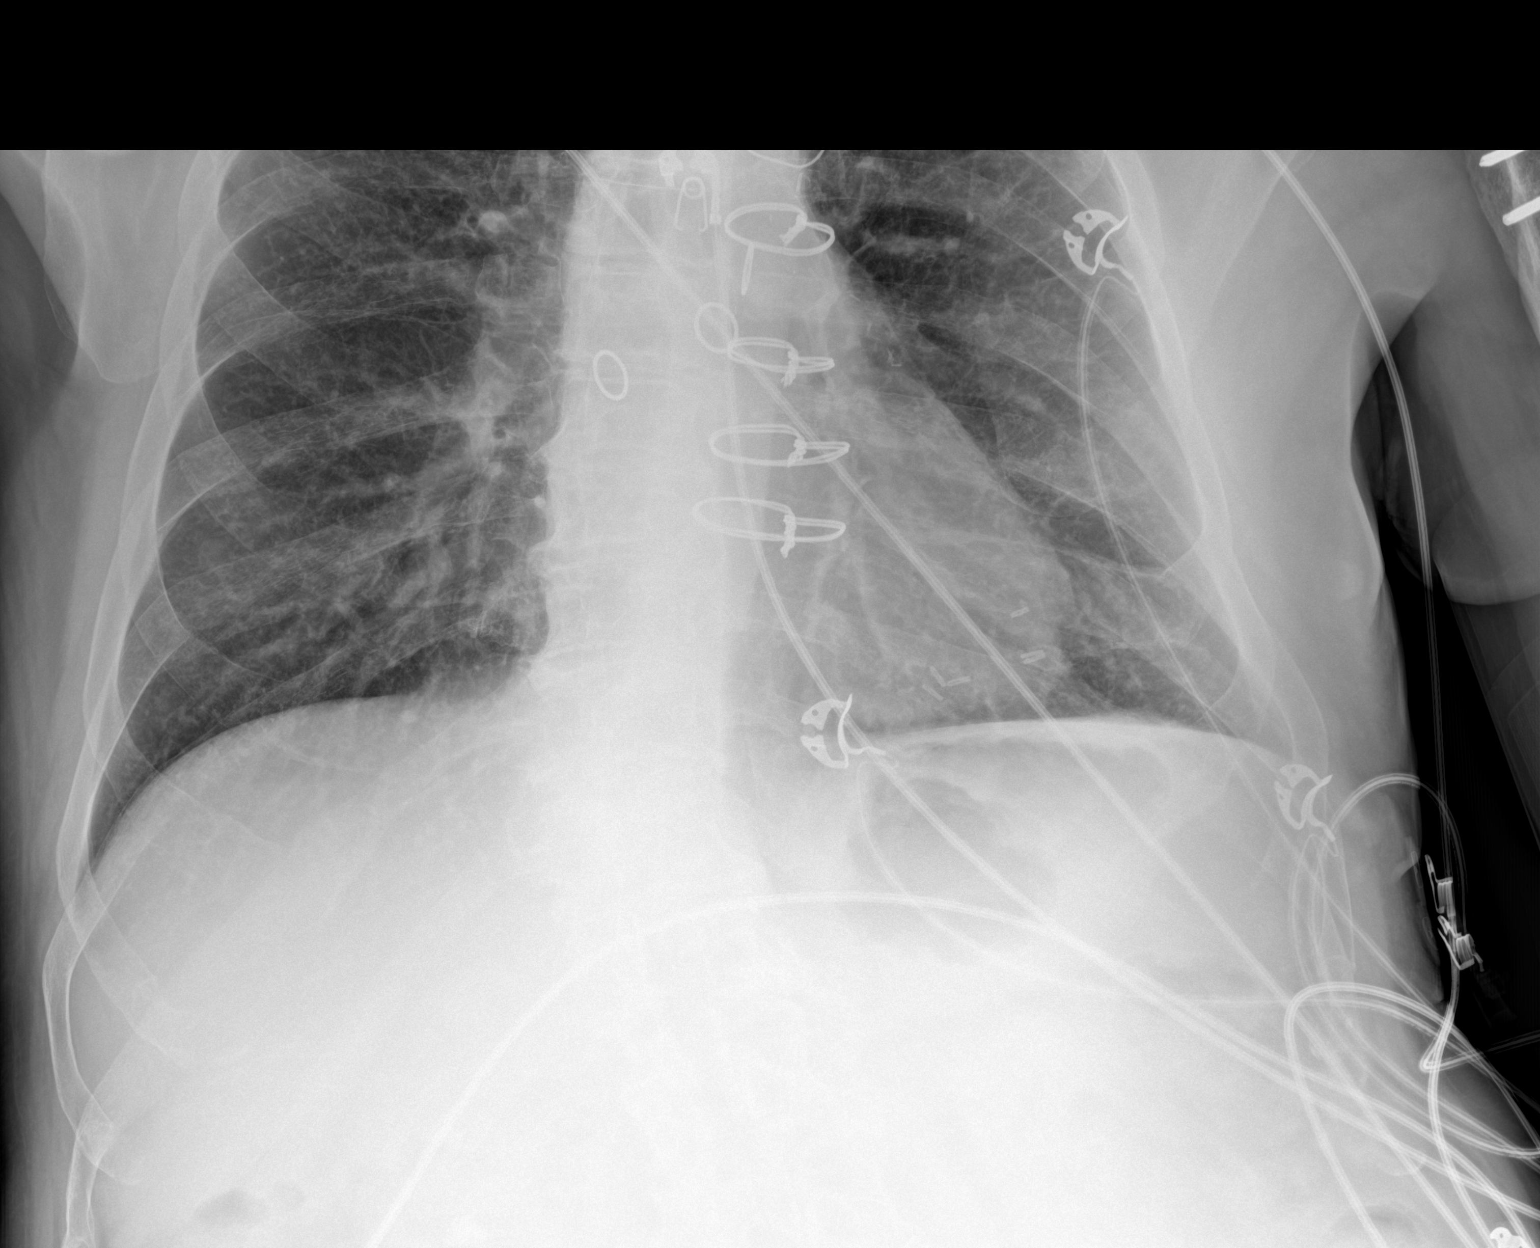

[2 of 2 positions shown; findings below may reference images not displayed]

FINDINGS: Heart size normal. Lung volumes are low. Mild interstitial
coarsening is stable. No edema or effusion is present. No focal
airspace disease is present.

Right shoulder and cervical spine surgery noted. Median sternotomy
noted.
IMPRESSION: No acute cardiopulmonary disease or significant interval change.

## 2021-02-16 MED ORDER — SODIUM CHLORIDE 0.9 % IV SOLN: Freq: Once | INTRAVENOUS | Status: AC

## 2021-02-16 MED ORDER — IOHEXOL 350 MG/ML SOLN
75.0000 mL | Freq: Once | INTRAVENOUS | Status: AC | PRN
  Administered 2021-02-16: 75 mL via INTRAVENOUS

## 2021-02-16 MED ORDER — ONDANSETRON HCL 4 MG/2ML IJ SOLN
4.0000 mg | Freq: Once | INTRAMUSCULAR | Status: AC
  Administered 2021-02-16: 4 mg via INTRAVENOUS
  Filled 2021-02-16: qty 2

## 2021-02-16 MED ORDER — NITROGLYCERIN 0.4 MG SL SUBL
0.4000 mg | SUBLINGUAL_TABLET | SUBLINGUAL | Status: DC | PRN
  Filled 2021-02-16: qty 1

## 2021-02-16 MED ORDER — ALUM & MAG HYDROXIDE-SIMETH 200-200-20 MG/5ML PO SUSP
30.0000 mL | Freq: Once | ORAL | Status: AC
  Administered 2021-02-16: 30 mL via ORAL
  Filled 2021-02-16: qty 30

## 2021-02-16 MED ORDER — LIDOCAINE VISCOUS HCL 2 % MT SOLN
15.0000 mL | Freq: Once | OROMUCOSAL | Status: AC
  Administered 2021-02-16: 15 mL via ORAL
  Filled 2021-02-16: qty 15

## 2021-02-16 MED ORDER — MORPHINE SULFATE (PF) 4 MG/ML IV SOLN
4.0000 mg | Freq: Once | INTRAVENOUS | Status: AC
  Administered 2021-02-16: 4 mg via INTRAVENOUS
  Filled 2021-02-16: qty 1

## 2021-02-16 MED ORDER — FENTANYL CITRATE PF 50 MCG/ML IJ SOSY
50.0000 ug | PREFILLED_SYRINGE | Freq: Once | INTRAMUSCULAR | Status: AC
  Administered 2021-02-16: 50 ug via INTRAVENOUS
  Filled 2021-02-16: qty 1

## 2021-02-16 MED ORDER — LORAZEPAM 2 MG/ML IJ SOLN
1.0000 mg | Freq: Once | INTRAMUSCULAR | Status: AC
  Administered 2021-02-16: 1 mg via INTRAVENOUS
  Filled 2021-02-16: qty 1

## 2021-02-16 NOTE — ED Provider Notes (Signed)
[facility] Emergency Department Provider Note  ____________________________________________   I have reviewed the triage vital signs and the nursing notes.   HISTORY  Chief Complaint Chest Pain   History limited by: Not Limited   HPI Bryan Scott is a 75 y.o. male who presents to the emergency department today because of concern for chest pain. Thep atient states the pain started shortly before arrival to the ER. He was driving at the time the pain started. Located in his central and left chest it does radiate into his left arm. He denies any recent illness. States he has had similar symptoms with previous heart attacks. Has history of CABG as well as stent placement.    Records reviewed. Per medical record review patient has a history of CABG.   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   GERD (gastroesophageal reflux disease)    Heparin allergy    Bleeding   History of tobacco use    Hx of CABG    Hx of cardiac cath    Hyperlipidemia    Hypertension    Ischemic cardiomyopathy    Paroxysmal atrial fibrillation (HCC)    On Xarelto anticoagulation   Type 2 diabetes mellitus ([place])    A1C 6.8% in 01/2013    Patient Active Problem List   Diagnosis Date Noted   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   Non-intractable vomiting    Dehydration    Fall    Injury    Malnutrition of moderate degree 09/19/2020   Protein-calorie malnutrition ([place]) 09/18/2020   Syncope 09/17/2020   History of pancreatitis    Cervical radiculopathy    Acute pancreatitis 08/07/2020   Hyperlipidemia associated with type 2 diabetes mellitus ([place]) 08/07/2020   NSTEMI (non-ST  elevated myocardial infarction) ([place]) 07/11/2020   AKI (acute kidney injury) ([place]) 99991111   Acute metabolic encephalopathy [id number]   GERD (gastroesophageal reflux disease) 09/09/2019   CAD (coronary artery disease) 09/09/2019   Chronic pain syndrome 09/09/2019   Leukocytosis 09/09/2019   Shortness of breath    Hyponatremia 09/26/2016   Left-sided chest pain 09/22/2016   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   Abnormal EKG 09/21/2016   Chronic pain 09/21/2016   PAF (paroxysmal atrial fibrillation) ([place]) 09/21/2016   Essential hypertension 05/02/2016   Carotid stenosis 05/02/2016   CAD in native artery    Atypical chest pain    DM (diabetes mellitus), type 2 with peripheral vascular complications (HCC)    Hyperlipidemia    Pain of left upper extremity    Angina pectoris ([place])    Abdominal pain    Chest pain, central 03/13/2015   Hypertensive urgency 03/13/2015   Spinal stenosis 01/10/2012    Past Surgical History:  Procedure Laterality Date   CARDIAC CATHETERIZATION     CORONARY ARTERY BYPASS GRAFT  2007   CORONARY STENT INTERVENTION N/A 07/13/2020   Procedure: CORONARY STENT INTERVENTION;  Surgeon: Nelva Bush, MD;  Location: [facility];  Service: Cardiovascular;  Laterality: N/A;   ESOPHAGOGASTRODUODENOSCOPY (EGD) WITH PROPOFOL N/A 09/20/2020   Procedure: ESOPHAGOGASTRODUODENOSCOPY (EGD) WITH PROPOFOL;  Surgeon: Ladene Artist, MD;  Location: [facility] ENDOSCOPY;  Service: Endoscopy;  Laterality: N/A;   LEFT HEART CATH AND CORS/GRAFTS ANGIOGRAPHY N/A 07/13/2020   Procedure: LEFT HEART CATH AND CORS/GRAFTS ANGIOGRAPHY;  Surgeon: Minna Merritts, MD;  Location: [facility];  Service: Cardiovascular;  Laterality: N/A;    Prior to Admission medications   Medication Sig Start Date End Date Taking? Authorizing Provider  acetaminophen (TYLENOL) 325 MG tablet Take 2 tablets (650 mg total) by mouth every 6 (six) hours. 09/25/20   Lurline Del,  DO  atorvastatin (LIPITOR) 80 MG tablet Take 1 tablet (80 mg total) by mouth daily. 10/11/20   Medina-Vargas, Monina C, NP  carvedilol (COREG) 6.25 MG tablet Take 1 tablet (6.25 mg total) by mouth 2 (two) times daily with a meal. 10/11/20   Medina-Vargas, Monina C, NP  clopidogrel (PLAVIX) 75 MG tablet Take 1 tablet (75 mg total) by mouth every morning. 10/11/20   Medina-Vargas, Monina C, NP  DULoxetine (CYMBALTA) 30 MG capsule Take 1 capsule (30 mg total) by mouth daily. 10/11/20   Medina-Vargas, Monina C, NP  empagliflozin (JARDIANCE) 25 MG TABS tablet Take 1 tablet (25 mg total) by mouth daily. 10/11/20   Medina-Vargas, Monina C, NP  ezetimibe (ZETIA) 10 MG tablet Take 1 tablet (10 mg total) by mouth daily. 10/11/20   Medina-Vargas, Monina C, NP  gabapentin (NEURONTIN) 100 MG capsule Take 1 capsule (100 mg total) by mouth 2 (two) times daily. 10/11/20   Medina-Vargas, Monina C, NP  isosorbide mononitrate (IMDUR) 60 MG 24 hr tablet Take 1 tablet (60 mg total) by mouth daily. 10/11/20   Medina-Vargas, Monina C, NP  melatonin 5 MG TABS Take 1 tablet (5 mg total) by mouth at bedtime. 10/11/20   Medina-Vargas, Monina C, NP  metFORMIN (GLUCOPHAGE) 500 MG tablet Take 1 tablet (500 mg total) by mouth 2 (two) times daily with a meal. 10/11/20   Medina-Vargas, Monina C, NP  Multiple Vitamin (MULTIVITAMIN WITH MINERALS) TABS tablet Take 1 tablet by mouth daily. 08/10/20   Fritzi Mandes, MD  nitroGLYCERIN (NITROSTAT) 0.4 MG SL tablet Place 1 tablet (0.4 mg total) under the tongue every 5 (five) minutes as needed for chest pain. 10/11/20   Medina-Vargas, Monina C, NP  Nutritional Supplements (FEEDING SUPPLEMENT, GLUCERNA 1.2 CAL,) LIQD Take 237 mLs by mouth daily in the afternoon.    [provider]  pantoprazole (PROTONIX) 40 MG tablet Take 1 tablet (40 mg total) by mouth daily. 10/11/20   Medina-Vargas, Monina C, NP  polyethylene glycol (MIRALAX / GLYCOLAX) 17 g packet Take 17 g by mouth daily. 09/25/20   Lurline Del, DO  rivaroxaban (XARELTO) 20 MG TABS tablet Take 1 tablet (20 mg total) by mouth daily with supper. 10/11/20   Medina-Vargas, Monina C, NP  senna-docusate (SENOKOT-S) 8.6-50 MG tablet Take 2 tablets by mouth 2 (two) times daily. 09/06/20   Sharen Hones, MD  spironolactone (ALDACTONE) 25 MG tablet Take 1 tablet (25 mg total) by mouth daily. 10/11/20   Medina-Vargas, Monina C, NP  sucralfate (CARAFATE) 1 g tablet Take 1 tablet (1 g total) by mouth 4 (four) times daily -  with meals and at bedtime. 10/11/20   Medina-Vargas, Monina C, NP  Tetrahydrozoline HCl (VISINE OP) Place 1 drop into both eyes daily as needed (dry red eyes).    [provider]    Allergies Aspergum [aspirin], Aspirin, Lisinopril, Angiotensin receptor blockers, and Heparin  Family History  Problem Relation Age of Onset  Heart disease Mother 43       Deceased   CVA Mother    Seizures Father 48       Deceased from complications with seizures    Social History Social History   Tobacco Use   Smoking status: Former    Packs/day: 1.00    Years: 30.00    Pack years: 30.00    Types: Cigarettes   Smokeless tobacco: Never   Tobacco comments:    30 pack-year history. Quit 8-9 years ago.   Vaping Use   Vaping Use: Never used  Substance Use Topics   Alcohol use: Not Currently   Drug use: Never    Review of Systems Constitutional: No fever/chills Eyes: No visual changes. ENT: No sore throat. Cardiovascular: Positive for chest pain. Respiratory: Denies shortness of breath. Gastrointestinal: No abdominal pain.  No nausea, no vomiting.  No diarrhea.   Genitourinary: Negative for dysuria. Musculoskeletal: Positive for left arm pain. Skin: Negative for rash. Neurological: Negative for headaches, focal weakness or numbness.  ____________________________________________   PHYSICAL EXAM:  VITAL SIGNS: ED Triage Vitals  Enc Vitals Group     BP 165/120     Pulse 82     Resp 20     Temp 98.7     Temp  src      SpO2 100   Constitutional: Alert and oriented.  Eyes: Conjunctivae are normal.  ENT      Head: Normocephalic and atraumatic.      Nose: No congestion/rhinnorhea.      Mouth/Throat: Mucous membranes are moist.      Neck: No stridor. Hematological/Lymphatic/Immunilogical: No cervical lymphadenopathy. Cardiovascular: Normal rate, regular rhythm.  No murmurs, rubs, or gallops.  Respiratory: Normal respiratory effort without tachypnea nor retractions. Breath sounds are clear and equal bilaterally. No wheezes/rales/rhonchi. Gastrointestinal: Soft and non tender. No rebound. No guarding.  Genitourinary: Deferred Musculoskeletal: Normal range of motion in all extremities. No lower extremity edema. Neurologic:  Normal speech and language. No gross focal neurologic deficits are appreciated.  Skin:  Skin is warm, dry and intact. No rash noted. Psychiatric: Mood and affect are normal. Speech and behavior are normal. Patient exhibits appropriate insight and judgment.  ____________________________________________    LABS (pertinent positives/negatives)  Trop hs 12 to 8 BMP wnl except glu 184, cr 1.40 CBC wbc 10.6, hgb 16.7, plt 221 ____________________________________________   EKG  I, Nance Pear, attending physician, personally viewed and interpreted this EKG  EKG Time: 1815 Rate: 85 Rhythm: sinus rhythm Axis: normal Intervals: qtc 428 QRS: narrow, q waves III ST changes: no st elevation Impression: abnormal ekg   ____________________________________________    RADIOLOGY  CXR No acute cardiopulmonary disease  CT angio pending at time of discharge ____________________________________________   PROCEDURES  Procedures  ____________________________________________   INITIAL IMPRESSION / ASSESSMENT AND PLAN / ED COURSE  Pertinent labs & imaging results that were available during my care of the patient were reviewed by me and considered in my medical  decision making (see chart for details).  Patient presented to the emergency department today because of concerns for chest pain.  Patient does have a history of coronary artery disease.  Initial troponin was negative and repeat actually shows downtrending.  EKG without any ST elevation.  Chest x-ray without any pneumothorax or pneumonia.  Unclear etiology of the patient's pain at this time.  He was given a GI cocktail however did not seem to significantly change his pain.  Will obtain a CT angio to  evaluate for possible dissection or PE.  Additionally will try some Ativan to help both with the nausea and possible anxiety if that is contributing the patient's symptoms.  Patient was signed out awaiting CT angio.  ____________________________________________   FINAL CLINICAL IMPRESSION(S) / ED DIAGNOSES  Non specific chest pain    Note: This dictation was prepared with Dragon dictation. Any transcriptional errors that result from this process are unintentional     Nance Pear, MD 02/16/21 2324

## 2021-02-16 NOTE — ED Notes (Signed)
Patient transported to CT 

## 2021-02-16 NOTE — ED Triage Notes (Signed)
Pt from home for chest pain that started at 1730 today describes as midsternal radiating to left arm. Pt had stent x 6 months ago.

## 2021-02-17 ENCOUNTER — Encounter: Payer: Self-pay | Admitting: Family Medicine

## 2021-02-17 DIAGNOSIS — R072 Precordial pain: Secondary | ICD-10-CM | POA: Diagnosis not present

## 2021-02-17 DIAGNOSIS — I480 Paroxysmal atrial fibrillation: Secondary | ICD-10-CM | POA: Diagnosis not present

## 2021-02-17 DIAGNOSIS — E119 Type 2 diabetes mellitus without complications: Secondary | ICD-10-CM

## 2021-02-17 DIAGNOSIS — R079 Chest pain, unspecified: Secondary | ICD-10-CM | POA: Diagnosis present

## 2021-02-17 DIAGNOSIS — R634 Abnormal weight loss: Secondary | ICD-10-CM

## 2021-02-17 DIAGNOSIS — N179 Acute kidney failure, unspecified: Secondary | ICD-10-CM

## 2021-02-17 DIAGNOSIS — I2510 Atherosclerotic heart disease of native coronary artery without angina pectoris: Secondary | ICD-10-CM | POA: Diagnosis not present

## 2021-02-17 DIAGNOSIS — N289 Disorder of kidney and ureter, unspecified: Secondary | ICD-10-CM

## 2021-02-17 DIAGNOSIS — K219 Gastro-esophageal reflux disease without esophagitis: Secondary | ICD-10-CM | POA: Diagnosis not present

## 2021-02-17 DIAGNOSIS — R748 Abnormal levels of other serum enzymes: Secondary | ICD-10-CM

## 2021-02-17 DIAGNOSIS — R627 Adult failure to thrive: Secondary | ICD-10-CM

## 2021-02-17 DIAGNOSIS — R0789 Other chest pain: Principal | ICD-10-CM

## 2021-02-17 LAB — HEPARIN LEVEL (UNFRACTIONATED)
Heparin Unfractionated: <0.1 IU/mL — ABNORMAL LOW (ref 0.30–0.70)
Heparin Unfractionated: 0.68 IU/mL (ref 0.30–0.70)
Heparin Unfractionated: >1.1 IU/mL — ABNORMAL HIGH (ref 0.30–0.70)

## 2021-02-17 LAB — BASIC METABOLIC PANEL
Anion gap: 7 (ref 5–15)
BUN: 21 mg/dL (ref 8–23)
CO2: 20 mmol/L — ABNORMAL LOW (ref 22–32)
Calcium: 7.3 mg/dL — ABNORMAL LOW (ref 8.9–10.3)
Chloride: 111 mmol/L (ref 98–111)
Creatinine, Ser: 0.78 mg/dL (ref 0.61–1.24)
GFR, Estimated: >60 mL/min (ref >60)
Glucose, Bld: 142 mg/dL — ABNORMAL HIGH (ref 70–99)
Potassium: 3.7 mmol/L (ref 3.5–5.1)
Sodium: 138 mmol/L (ref 135–145)

## 2021-02-17 LAB — HEPATIC FUNCTION PANEL
ALT: 24 U/L (ref 0–44)
AST: 24 U/L (ref 15–41)
Albumin: 3.8 g/dL (ref 3.5–5.0)
Alkaline Phosphatase: 53 U/L (ref 38–126)
Bilirubin, Direct: 0.3 mg/dL — ABNORMAL HIGH (ref 0.0–0.2)
Indirect Bilirubin: 2.9 mg/dL — ABNORMAL HIGH (ref 0.3–0.9)
Total Bilirubin: 3.2 mg/dL — ABNORMAL HIGH (ref 0.3–1.2)
Total Protein: 7 g/dL (ref 6.5–8.1)

## 2021-02-17 LAB — LIPASE, BLOOD: Lipase: 250 U/L — ABNORMAL HIGH (ref 11–51)

## 2021-02-17 LAB — LIPID PANEL
Cholesterol: 108 mg/dL (ref 0–200)
HDL: 52 mg/dL (ref >40)
LDL Cholesterol: 26 mg/dL (ref 0–99)
Total CHOL/HDL Ratio: 2.1 RATIO
Triglycerides: 149 mg/dL (ref <150)
VLDL: 30 mg/dL (ref 0–40)

## 2021-02-17 LAB — APTT
aPTT: 28 seconds (ref 24–36)
aPTT: 161 seconds (ref 24–36)

## 2021-02-17 LAB — GLUCOSE, CAPILLARY
Glucose-Capillary: 143 mg/dL — ABNORMAL HIGH (ref 70–99)
Glucose-Capillary: 158 mg/dL — ABNORMAL HIGH (ref 70–99)

## 2021-02-17 LAB — CBG MONITORING, ED
Glucose-Capillary: 274 mg/dL — ABNORMAL HIGH (ref 70–99)
Glucose-Capillary: 234 mg/dL — ABNORMAL HIGH (ref 70–99)
Glucose-Capillary: 151 mg/dL — ABNORMAL HIGH (ref 70–99)
Glucose-Capillary: 134 mg/dL — ABNORMAL HIGH (ref 70–99)

## 2021-02-17 LAB — BRAIN NATRIURETIC PEPTIDE: B Natriuretic Peptide: 49.7 pg/mL (ref 0.0–100.0)

## 2021-02-17 LAB — PROTIME-INR
INR: 1.2 (ref 0.8–1.2)
Prothrombin Time: 14.8 seconds (ref 11.4–15.2)

## 2021-02-17 LAB — TSH: TSH: 0.703 u[IU]/mL (ref 0.350–4.500)

## 2021-02-17 MED ORDER — ACETAMINOPHEN 325 MG PO TABS: 650.0000 mg | ORAL_TABLET | ORAL | Status: DC | PRN

## 2021-02-17 MED ORDER — RIVAROXABAN 20 MG PO TABS
20.0000 mg | ORAL_TABLET | Freq: Every day | ORAL | Status: DC
  Administered 2021-02-17 – 2021-02-19 (×3): 20 mg via ORAL
  Filled 2021-02-17 (×3): qty 1

## 2021-02-17 MED ORDER — HEPARIN (PORCINE) 25000 UT/250ML-% IV SOLN
1200.0000 [IU]/h | INTRAVENOUS | Status: DC
  Administered 2021-02-17: 1200 [IU]/h via INTRAVENOUS
  Filled 2021-02-17: qty 250

## 2021-02-17 MED ORDER — CARVEDILOL 6.25 MG PO TABS
6.2500 mg | ORAL_TABLET | Freq: Two times a day (BID) | ORAL | Status: DC
  Administered 2021-02-17 – 2021-02-19 (×5): 6.25 mg via ORAL
  Filled 2021-02-17 (×5): qty 1

## 2021-02-17 MED ORDER — INSULIN ASPART 100 UNIT/ML IJ SOLN
0.0000 [IU] | INTRAMUSCULAR | Status: DC
  Administered 2021-02-17: 2 [IU] via SUBCUTANEOUS
  Administered 2021-02-17: 5 [IU] via SUBCUTANEOUS
  Administered 2021-02-17: 2 [IU] via SUBCUTANEOUS
  Administered 2021-02-17: 3 [IU] via SUBCUTANEOUS
  Administered 2021-02-17 – 2021-02-18 (×3): 1 [IU] via SUBCUTANEOUS
  Administered 2021-02-18 (×2): 2 [IU] via SUBCUTANEOUS
  Administered 2021-02-18: 3 [IU] via SUBCUTANEOUS
  Administered 2021-02-19 (×3): 2 [IU] via SUBCUTANEOUS
  Filled 2021-02-17 (×12): qty 1

## 2021-02-17 MED ORDER — SODIUM CHLORIDE 0.9 % IV SOLN: INTRAVENOUS | Status: AC

## 2021-02-17 MED ORDER — ONDANSETRON HCL 4 MG/2ML IJ SOLN
4.0000 mg | Freq: Four times a day (QID) | INTRAMUSCULAR | Status: DC | PRN
  Administered 2021-02-17 – 2021-02-19 (×3): 4 mg via INTRAVENOUS
  Filled 2021-02-17 (×3): qty 2

## 2021-02-17 MED ORDER — LIDOCAINE 5 % EX PTCH
1.0000 | MEDICATED_PATCH | CUTANEOUS | Status: DC
  Administered 2021-02-17 – 2021-02-18 (×2): 1 via TRANSDERMAL
  Filled 2021-02-17 (×3): qty 1

## 2021-02-17 MED ORDER — OXYCODONE HCL 5 MG PO TABS
5.0000 mg | ORAL_TABLET | ORAL | Status: AC | PRN
  Administered 2021-02-17 (×4): 10 mg via ORAL
  Filled 2021-02-17 (×4): qty 2

## 2021-02-17 MED ORDER — RANOLAZINE ER 500 MG PO TB12
500.0000 mg | ORAL_TABLET | Freq: Two times a day (BID) | ORAL | Status: DC
  Administered 2021-02-17 – 2021-02-19 (×5): 500 mg via ORAL
  Filled 2021-02-17 (×6): qty 1

## 2021-02-17 MED ORDER — OXYCODONE HCL 5 MG PO TABS
5.0000 mg | ORAL_TABLET | ORAL | Status: AC | PRN
  Administered 2021-02-18: 5 mg via ORAL
  Administered 2021-02-18 (×2): 10 mg via ORAL
  Filled 2021-02-17: qty 1
  Filled 2021-02-17 (×2): qty 2

## 2021-02-17 MED ORDER — PANTOPRAZOLE SODIUM 40 MG PO TBEC
40.0000 mg | DELAYED_RELEASE_TABLET | Freq: Every day | ORAL | Status: DC
  Administered 2021-02-17 – 2021-02-19 (×3): 40 mg via ORAL
  Filled 2021-02-17 (×3): qty 1

## 2021-02-17 MED ORDER — CLOPIDOGREL BISULFATE 75 MG PO TABS
75.0000 mg | ORAL_TABLET | Freq: Every day | ORAL | Status: DC
  Administered 2021-02-17 – 2021-02-19 (×3): 75 mg via ORAL
  Filled 2021-02-17 (×3): qty 1

## 2021-02-17 MED ORDER — HYDROMORPHONE HCL 1 MG/ML IJ SOLN
0.5000 mg | Freq: Once | INTRAMUSCULAR | Status: AC
  Administered 2021-02-17: 0.5 mg via INTRAVENOUS
  Filled 2021-02-17: qty 1

## 2021-02-17 MED ORDER — POLYETHYLENE GLYCOL 3350 17 G PO PACK: 17.0000 g | PACK | Freq: Every day | ORAL | Status: DC | PRN

## 2021-02-17 MED ORDER — ATORVASTATIN CALCIUM 80 MG PO TABS
80.0000 mg | ORAL_TABLET | Freq: Every day | ORAL | Status: DC
  Administered 2021-02-17 – 2021-02-19 (×3): 80 mg via ORAL
  Filled 2021-02-17 (×2): qty 1
  Filled 2021-02-17: qty 4

## 2021-02-17 MED ORDER — SENNA 8.6 MG PO TABS
1.0000 | ORAL_TABLET | Freq: Every day | ORAL | Status: DC | PRN
  Administered 2021-02-19: 8.6 mg via ORAL
  Filled 2021-02-17: qty 1

## 2021-02-17 MED ORDER — ALUM & MAG HYDROXIDE-SIMETH 200-200-20 MG/5ML PO SUSP: 15.0000 mL | Freq: Four times a day (QID) | ORAL | Status: DC | PRN

## 2021-02-17 NOTE — Progress Notes (Addendum)
[place] for Heparin  Indication: chest pain/ACS/afib  Allergies  Allergen Reactions   Aspergum [Aspirin] Anaphylaxis   Aspirin Swelling   Lisinopril Anaphylaxis   Angiotensin Receptor Blockers     Never taken but PMH of angioedema with ACE-I ( Lisinopril)   Heparin Other (See Comments)    Reaction:  Bleeding     Patient Measurements: Height: 6' (182.9 cm) Weight: 88.6 kg (195 lb 6.4 oz) IBW/kg (Calculated) : 77.6 Heparin Dosing Weight: 88.6 kg   Vital Signs: BP: 126/76 (09/04 1330) Pulse Rate: 79 (09/04 1330)  Labs: Recent Labs    02/16/21 1817 02/16/21 2040 02/17/21 0300 02/17/21 1147  HGB 16.7  --   --   --   HCT 47.8  --   --   --   PLT 221  --   --   --   APTT  --   --  28  --   LABPROT  --   --  14.8  --   INR  --   --  1.2  --   HEPARINUNFRC  --   --  <0.10* 0.68  CREATININE 1.40*  --   --  0.78  TROPONINIHS 12 8  --   --      Estimated Creatinine Clearance: 87.6 mL/min (by C-G formula based on SCr of 0.78 mg/dL).   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   Pt was on Xarelto 20 mg PO daily at home.  Unsure of last dose.   Pt was  agitated/combative so RN and Rx tech unable to determine time of last dose.   9/4 0300 HL < 0.1  9/4 1147 HL 0.68   Goal of Therapy:  Heparin level 0.3-0.7 units/ml Monitor platelets by anticoagulation protocol: Yes   Plan:  Baseline heparin level was not falsely elevated. Will use heparin level to monitor heparin infusion. Heparin level was therapeutic. Will continue heparin infusion at 1200 units/hr. Recheck heparin level in 8 hours. CBC daily while on heparin.    Oswald Hillock, PharmD 02/17/2021,1:37 PM

## 2021-02-17 NOTE — H&P (Signed)
History and Physical    Bryan Scott [id number] DOB: [birth year] DOA: 02/16/2021  PCP: [facility] [place]   Patient coming from: Home   Chief Complaint: Chest pain   HPI: Bryan Scott is a 75 y.o. male with medical history significant for type 2 diabetes mellitus, paroxysmal atrial fibrillation on Xarelto, CAD with CABG, stent placed to bypass graft in January 2022, and chronic pain, now presenting to the emergency department with chest pain.  Patient reports that he was driving yesterday evening when he developed severe pain in the mid chest with radiation to the left arm, nausea, diaphoresis, and shortness of breath.  Symptoms were waxing and waning initially and later became constant.  He is unable to identify any alleviating or exacerbating factors.  He denies any fever and denies leg swelling.  ED Course: Upon arrival to the ED, patient is found to be afebrile, saturating mid 90s on room air, and with stable blood pressure.  EKG features sinus rhythm with inferior ST elevation and lateral T wave inversions similar to prior.  Chest x-ray negative for acute cardiopulmonary disease.  Chemistry panel with creatinine 1.40 and glucose 184.  CBC with slight leukocytosis.  Troponin normal x2.  CTA chest negative for PE but notable for interval thinning of the LV apex.  Patient was treated with GI cocktail, Ativan, morphine, Dilaudid, IV fluids, and started on IV heparin in the ED.  Review of Systems:  All other systems reviewed and apart from HPI, are negative.  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   GERD (gastroesophageal reflux disease)    Heparin allergy    Bleeding   History of tobacco use     Hx of CABG    Hx of cardiac cath    Hyperlipidemia    Hypertension    Ischemic cardiomyopathy    Paroxysmal atrial fibrillation (HCC)    On Xarelto anticoagulation   Type 2 diabetes mellitus ([place])    A1C 6.8% in 01/2013    Past Surgical History:  Procedure Laterality Date   CARDIAC CATHETERIZATION     CORONARY ARTERY BYPASS GRAFT  2007   CORONARY STENT INTERVENTION N/A 07/13/2020   Procedure: CORONARY STENT INTERVENTION;  Surgeon: Nelva Bush, MD;  Location: [place] [facility];  Service: Cardiovascular;  Laterality: N/A;   ESOPHAGOGASTRODUODENOSCOPY (EGD) WITH PROPOFOL N/A 09/20/2020   Procedure: ESOPHAGOGASTRODUODENOSCOPY (EGD) WITH PROPOFOL;  Surgeon: Ladene Artist, MD;  Location[facility];  Service: Endoscopy;  Laterality: N/A;   LEFT HEART CATH AND CORS/GRAFTS ANGIOGRAPHY N/A 07/13/2020   Procedure: LEFT HEART CATH AND CORS/GRAFTS ANGIOGRAPHY;  Surgeon: Minna Merritts, MD;  Location: [place] [facility];  Service: Cardiovascular;  Laterality: N/A;    Social History:   reports that he has quit smoking. His smoking use included cigarettes. He has a 30.00 pack-year smoking history. He has never used smokeless tobacco. He reports that he does not currently use alcohol. He reports that he does not use drugs.  Allergies  Allergen Reactions   Aspergum [Aspirin] Anaphylaxis   Aspirin Swelling   Lisinopril Anaphylaxis   Angiotensin Receptor  Blockers     Never taken but PMH of angioedema with ACE-I ( Lisinopril)   Heparin Other (See Comments)    Reaction:  Bleeding     Family History  Problem Relation Age of Onset   Heart disease Mother 39       Deceased   CVA Mother    Seizures Father 35       Deceased from complications with seizures     Prior to Admission medications   Medication Sig Start Date End Date Taking? Authorizing Provider  acetaminophen (TYLENOL) 325 MG tablet Take 2 tablets (650 mg total) by mouth every 6 (six) hours. 09/25/20   Lurline Del,  DO  atorvastatin (LIPITOR) 80 MG tablet Take 1 tablet (80 mg total) by mouth daily. 10/11/20   Medina-Vargas, Monina C, NP  carvedilol (COREG) 6.25 MG tablet Take 1 tablet (6.25 mg total) by mouth 2 (two) times daily with a meal. 10/11/20   Medina-Vargas, Monina C, NP  clopidogrel (PLAVIX) 75 MG tablet Take 1 tablet (75 mg total) by mouth every morning. 10/11/20   Medina-Vargas, Monina C, NP  DULoxetine (CYMBALTA) 30 MG capsule Take 1 capsule (30 mg total) by mouth daily. 10/11/20   Medina-Vargas, Monina C, NP  empagliflozin (JARDIANCE) 25 MG TABS tablet Take 1 tablet (25 mg total) by mouth daily. 10/11/20   Medina-Vargas, Monina C, NP  ezetimibe (ZETIA) 10 MG tablet Take 1 tablet (10 mg total) by mouth daily. 10/11/20   Medina-Vargas, Monina C, NP  gabapentin (NEURONTIN) 100 MG capsule Take 1 capsule (100 mg total) by mouth 2 (two) times daily. 10/11/20   Medina-Vargas, Monina C, NP  isosorbide mononitrate (IMDUR) 60 MG 24 hr tablet Take 1 tablet (60 mg total) by mouth daily. 10/11/20   Medina-Vargas, Monina C, NP  melatonin 5 MG TABS Take 1 tablet (5 mg total) by mouth at bedtime. 10/11/20   Medina-Vargas, Monina C, NP  metFORMIN (GLUCOPHAGE) 500 MG tablet Take 1 tablet (500 mg total) by mouth 2 (two) times daily with a meal. 10/11/20   Medina-Vargas, Monina C, NP  Multiple Vitamin (MULTIVITAMIN WITH MINERALS) TABS tablet Take 1 tablet by mouth daily. 08/10/20   Fritzi Mandes, MD  nitroGLYCERIN (NITROSTAT) 0.4 MG SL tablet Place 1 tablet (0.4 mg total) under the tongue every 5 (five) minutes as needed for chest pain. 10/11/20   Medina-Vargas, Monina C, NP  Nutritional Supplements (FEEDING SUPPLEMENT, GLUCERNA 1.2 CAL,) LIQD Take 237 mLs by mouth daily in the afternoon.    [provider]  pantoprazole (PROTONIX) 40 MG tablet Take 1 tablet (40 mg total) by mouth daily. 10/11/20   Medina-Vargas, Monina C, NP  polyethylene glycol (MIRALAX / GLYCOLAX) 17 g packet Take 17 g by mouth daily. 09/25/20   Lurline Del, DO  rivaroxaban (XARELTO) 20 MG TABS tablet Take 1 tablet (20 mg total) by mouth daily with supper. 10/11/20   Medina-Vargas, Monina C, NP  senna-docusate (SENOKOT-S) 8.6-50 MG tablet Take 2 tablets by mouth 2 (two) times daily. 09/06/20   Sharen Hones, MD  spironolactone (ALDACTONE) 25 MG tablet Take 1 tablet (25 mg total) by mouth daily. 10/11/20   Medina-Vargas, Monina C, NP  sucralfate (CARAFATE) 1 g tablet Take 1 tablet (1 g total) by mouth 4 (four) times daily -  with meals and at bedtime. 10/11/20   Medina-Vargas, Monina C, NP  Tetrahydrozoline HCl (VISINE OP) Place 1 drop into both eyes daily as needed (dry red eyes).    [provider]  Physical Exam: Vitals:   02/16/21 2100 02/16/21 2230 02/16/21 2300 02/16/21 2330  BP: 139/67 (!) 150/88 140/75 134/76  Pulse: 92 89 89 91  Resp: (!) '22 19 18 '$ (!) 24  Temp:      TempSrc:      SpO2: 97% 92% 95% 94%  Weight:      Height:        Constitutional: NAD, calm  Eyes: PERTLA, lids and conjunctivae normal ENMT: Mucous membranes are moist. Posterior pharynx clear of any exudate or lesions.   Neck: supple, no masses  Respiratory: no wheezing, no crackles. No accessory muscle use.  Cardiovascular: S1 & S2 heard, regular rate and rhythm. No extremity edema.   Abdomen: No distension, no tenderness, soft. Bowel sounds active.  Musculoskeletal: no clubbing / cyanosis. No joint deformity upper and lower extremities.   Skin: no significant rashes, lesions, ulcers. Warm, dry, well-perfused. Neurologic: CN 2-12 grossly intact. Sensation intact. Moving all extremities.  Psychiatric: Alert and oriented to person, place, and situation. Calm and cooperative.    Labs and Imaging on Admission: I have personally reviewed following labs and imaging studies  CBC: Recent Labs  Lab 02/16/21 1817  WBC 10.6*  NEUTROABS 8.7*  HGB 16.7  HCT 47.8  MCV 97.6  PLT A999333   Basic Metabolic Panel: Recent Labs  Lab 02/16/21 1817  NA 136  K  4.1  CL 103  CO2 23  GLUCOSE 184*  BUN 21  CREATININE 1.40*  CALCIUM 9.6   GFR: Estimated Creatinine Clearance: 50 mL/min (A) (by C-G formula based on SCr of 1.4 mg/dL (H)). Liver Function Tests: No results for input(s): AST, ALT, ALKPHOS, BILITOT, PROT, ALBUMIN in the last 168 hours. No results for input(s): LIPASE, AMYLASE in the last 168 hours. No results for input(s): AMMONIA in the last 168 hours. Coagulation Profile: No results for input(s): INR, PROTIME in the last 168 hours. Cardiac Enzymes: No results for input(s): CKTOTAL, CKMB, CKMBINDEX, TROPONINI in the last 168 hours. BNP (last 3 results) No results for input(s): PROBNP in the last 8760 hours. HbA1C: No results for input(s): HGBA1C in the last 72 hours. CBG: No results for input(s): GLUCAP in the last 168 hours. Lipid Profile: No results for input(s): CHOL, HDL, LDLCALC, TRIG, CHOLHDL, LDLDIRECT in the last 72 hours. Thyroid Function Tests: No results for input(s): TSH, T4TOTAL, FREET4, T3FREE, THYROIDAB in the last 72 hours. Anemia Panel: No results for input(s): VITAMINB12, FOLATE, FERRITIN, TIBC, IRON, RETICCTPCT in the last 72 hours. Urine analysis:    Component Value Date/Time   COLORURINE YELLOW 09/18/2020 [place] 09/18/2020 0747   APPEARANCEUR Clear 10/15/2013 2200   LABSPEC 1.023 09/18/2020 0747   LABSPEC 1.012 10/15/2013 2200   PHURINE 5.0 09/18/2020 0747   GLUCOSEU >=500 (A) 09/18/2020 0747   GLUCOSEU Negative 10/15/2013 2200   HGBUR SMALL (A) 09/18/2020 0747   BILIRUBINUR NEGATIVE 09/18/2020 0747   BILIRUBINUR Negative 10/15/2013 2200   KETONESUR 20 (A) 09/18/2020 0747   PROTEINUR NEGATIVE 09/18/2020 0747   NITRITE NEGATIVE 09/18/2020 0747   LEUKOCYTESUR NEGATIVE 09/18/2020 0747   LEUKOCYTESUR Negative 10/15/2013 2200   Sepsis Labs: '@LABRCNTIP'$ (procalcitonin:4,lacticidven:4) )No results found for this or any previous visit (from the past 240 hour(s)).   Radiological Exams  on Admission: CT Angio Chest PE W and/or Wo Contrast  Result Date: 02/17/2021 CLINICAL DATA:  Left chest pain EXAM: CT ANGIOGRAPHY CHEST WITH CONTRAST TECHNIQUE: Multidetector CT imaging of the chest was performed using the standard protocol during  bolus administration of intravenous contrast. Multiplanar CT image reconstructions and MIPs were obtained to evaluate the vascular anatomy. CONTRAST:  74m OMNIPAQUE IOHEXOL 350 MG/ML SOLN COMPARISON:  09/03/2020 FINDINGS: Cardiovascular: There is adequate opacification of the pulmonary arterial tree. No intraluminal filling defect identified to suggest acute pulmonary embolism. Central pulmonary arteries are of normal caliber. Coronary artery bypass grafting has been performed. Global cardiac size is within normal limits. Thinning of the left ventricular apical wall appears progressive since prior examination and may represent the sequela of interval myocardial infarction. No pericardial effusion. Mild atherosclerotic calcification noted within the thoracic aorta. No aortic aneurysm. Mediastinum/Nodes: No enlarged mediastinal, hilar, or axillary lymph nodes. Thyroid gland, trachea, and esophagus demonstrate no significant findings. Lungs/Pleura: Lungs are clear. No pleural effusion or pneumothorax. Upper Abdomen: No acute abnormality. Musculoskeletal: Cervical fusion hardware and left humeral ORIF hardware is partially visualized. No acute bone abnormality. No lytic or blastic bone lesion. Osseous structures are age-appropriate. Review of the MIP images confirms the above findings. IMPRESSION: No pulmonary embolism. Status post coronary artery bypass grafting. Interval development of left ventricular apical thinning, possibly reflecting the sequela of interval myocardial infarction or ongoing myocardial ischemia. Correlation with cardiac enzymes and/or EKG evaluation may be helpful for further evaluation. Aortic Atherosclerosis (ICD10-I70.0). Electronically Signed    By: AFidela SalisburyM.D.   On: 02/17/2021 00:02   DG Chest Portable 1 View  Result Date: 02/16/2021 CLINICAL DATA:  Chest pain. EXAM: PORTABLE CHEST 1 VIEW COMPARISON:  One-view chest x-ray 09/30/2020. FINDINGS: Heart size normal. Lung volumes are low. Mild interstitial coarsening is stable. No edema or effusion is present. No focal airspace disease is present. Right shoulder and cervical spine surgery noted. Median sternotomy noted. IMPRESSION: No acute cardiopulmonary disease or significant interval change. Electronically Signed   By: CSan MorelleM.D.   On: 02/16/2021 18:54    EKG: Independently reviewed. Sinus rhythm, inferior ST abnormality and lateral T-wave abnormality similar to priors.   Assessment/Plan   1. Chest pain  - Patient with hx of CAD, GERD, gastroparesis, and pancreatitis presents with severe chest pain  - EKG does not appear significantly changed, serial troponins normal, and CTA chest negative for PE but notable for interval thinning of LV apex  - He was treated with multiple doses of IV analgesics in ED and started on IV heparin  - Continue Lipitor, Plavix, and Coreg, check lipase and LFTs, request cardiology consultation, and continue IV heparin and cardiac monitoring for now    2. Paroxysmal atrial fibrillation  - On Xarelto prior to arrival, started on IV heparin in ED  - Continue beta-blocker   3. Renal insufficiency  - SCr is 1.40 on admission; was 1.1 on 01/30/21   - He was given IVF in ED  - Renally-dose medications, avoid nephrotoxins, monitor    4. GERD  - Continue PPI    5. Type II DM  - A1c was 6.9% in April 2022  - Check CBGs and use SSI for now      DVT prophylaxis: IV heparin started in ED, Xarelto pta  Code Status: Full  Level of Care: Level of care: Med-Surg Family Communication: None present  Disposition Plan:  Patient is from: Home  Anticipated d/c is to: Home  Anticipated d/c date is: Possibly as early as 02/18/21 Patient  currently: Pending pain-control, cardiology consult  Consults called: Message sent to cardiology with request for consult Admission status: Observation     TVianne Bulls MD [facility]  02/17/2021, 1:56 AM

## 2021-02-17 NOTE — ED Notes (Signed)
This nurse was approached by Pharmacy tech who informed that the patient was not in his bed and all the monitor leads were laying on the ground. She did not see the patient. This nurse entered the room immediately and found the patient on his hands and knees just outside the bathroom. He was asking for help and was able to stand with assistance. Pt returned to bed and found to be covered in urine. He said he was trying to go to the bathroom. When asked why he did not use the urinal at bedside or ring the call bell he stated "I did but nobody ever comes when I push that thing". For the record there was no call bell alarm from room 11 at time of incident. Pt cleaned up and placed in dry brief. Full musculoskeletal and neuro exams conducted. Dr Myna Hidalgo notified. Pt denied hitting head or losing consciousness. He stated "I just felt dizzy and fell on my knees". Pt claims knees, hips, shoulders and wrists hurt but they hurt before the fall too. Vitals WNL with exception of slight tachycardia from 100-107.

## 2021-02-17 NOTE — Progress Notes (Signed)
PROGRESS NOTE    Bryan Scott  [id number] DOB: 01/05/1946 DOA: 02/16/2021 [facility]CP: [facility] [place]    Chief Complaint  Patient presents with   Chest Pain    Brief Narrative:  Bryan Scott is a 75 y.o. male with medical history significant for type 2 diabetes mellitus, paroxysmal atrial fibrillation on Xarelto, CAD with CABG, stent placed to bypass graft in January 2022, and chronic pain, now presenting to the emergency department with chest pain.  Patient reports that he was driving yesterday evening when he developed severe pain in the mid chest with radiation to the left arm, nausea, diaphoresis, and shortness of breath.  Symptoms were waxing and waning initially and later became constant.  He is unable to identify any alleviating or exacerbating factors.  He denies any fever and denies leg swelling.  Subjective:   He continue to c/o persistent left side chest pain, can not get comfortable He reports feeling weak, no appetite  Assessment & Plan:   Principal Problem:   Chest pain Active Problems:   PAF (paroxysmal atrial fibrillation) (HCC)   GERD (gastroesophageal reflux disease)   CAD (coronary artery disease)   Renal insufficiency  Chest pain -rule out ACS, seen by cardiology who started ranexa for chronic angina  Continue aspirin, Plavix, Lipitor, Coreg He is cleared by cardiology  Left side chest wall pain Lidocaine patchy Prn analgesics   AKI on CKD 2 -Creatinine 1.4 presentation -He received hydration, creatinine improved to 0.7 -Appears dehydrated on exam, with poor oral intake, continue hydration for another 24 hours -Hold spironolactone, hold metformin   PAF In sinus rhythm Continue Coreg and Xarelto  Hypertension Appears on Coreg, Imdur, spironolactone at home Currently BP low normal with AKI, hold Imdur and spironolactone, continue Coreg   Noninsulin-dependent type 2 diabetes -Report poor appetite and progressive weight loss -Last A1c 6.9  in April 2022, repeat A1c in process -Has postprandial hyperglycemia, continue sliding scale insulin  Abdominal lipase Appear recently evaluated by GI had ERCP and EUS, report reviewed pancreas normal by report Does not appear to have abdominal pain, he mostly complaining of chest pain Repeat lipase in the morning   Generalized weakness, weight loss of 40lbs in the last 57mots PT eval, nutrition consult     Body mass index is 26.5 kg/m..Marland Kitchen    Unresulted Labs (From admission, onward)     Start     Ordered   02/18/21 0500  CBC  Tomorrow morning,   STAT        02/17/21 1340   02/17/21 2000  Heparin level (unfractionated)  Once-Timed,   STAT        02/17/21 1340   02/17/21 0500  Hemoglobin A1c  Tomorrow morning,   STAT       Comments: To assess prior glycemic control    02/17/21 0155              DVT prophylaxis:   Xarelto   Code Status: Full Family Communication: Patient Disposition:   Status is: Observation   Dispo: The patient is from: Home, lives alone              Anticipated d/c is to: Home with home health versus SNF pending PT eval               Anticipated d/c date is: Likely tomorrow, if lipase trending down, has a safe discharge plan  Consultants:  Cardiology  Procedures:  None  Antimicrobials:    Anti-infectives (From admission, onward)    None          Objective: Vitals:   02/17/21 1230 02/17/21 1300 02/17/21 1330 02/17/21 1400  BP: 131/75 119/70 126/76 135/73  Pulse: 86 77 79 73  Resp: '16 16 20 13  '$ Temp:      TempSrc:      SpO2: 94% 95% 96% 97%  Weight:      Height:        Intake/Output Summary (Last 24 hours) at 02/17/2021 1422 Last data filed at 02/17/2021 1135 Gross per 24 hour  Intake 607.76 ml  Output 600 ml  Net 7.76 ml   Filed Weights   02/16/21 1815  Weight: 88.6 kg    Examination:  General exam: frail, weak, chronically ill appearing  Respiratory system: Clear to auscultation. Respiratory  effort normal. Left side chest wall tenderness Cardiovascular system: S1 & S2 heard, RRR. No JVD, no murmur, No pedal edema. Gastrointestinal system: Abdomen is nondistended, soft and nontender. Normal bowel sounds heard. Central nervous system: Alert and oriented.x3 No focal neurological deficits. Extremities: no edema, generalized weakness, not able to lift both arms above shoulders, barely able to lift both legs against gravity  Skin: No rashes, lesions or ulcers Psychiatry: Judgement and insight appear normal. Mood & affect appropriate.     Data Reviewed: I have personally reviewed following labs and imaging studies  CBC: Recent Labs  Lab 02/16/21 1817  WBC 10.6*  NEUTROABS 8.7*  HGB 16.7  HCT 47.8  MCV 97.6  PLT [id number]    Basic Metabolic Panel: Recent Labs  Lab 02/16/21 1817 02/17/21 1147  NA 136 138  K 4.1 3.7  CL 103 111  CO2 23 20*  GLUCOSE 184* 142*  BUN 21 21  CREATININE 1.40* 0.78  CALCIUM 9.6 7.3*    GFR: Estimated Creatinine Clearance: 87.6 mL/min (by C-G formula based on SCr of 0.78 mg/dL).  Liver Function Tests: Recent Labs  Lab 02/17/21 0300  AST 24  ALT 24  ALKPHOS 53  BILITOT 3.2*  PROT 7.0  ALBUMIN 3.8    CBG: Recent Labs  Lab 02/17/21 0326 02/17/21 0724 02/17/21 1133  GLUCAP 274* 234* 151*     No results found for this or any previous visit (from the past 240 hour(s)).       Radiology Studies: CT Angio Chest PE W and/or Wo Contrast  Result Date: 02/17/2021 CLINICAL DATA:  Left chest pain EXAM: CT ANGIOGRAPHY CHEST WITH CONTRAST TECHNIQUE: Multidetector CT imaging of the chest was performed using the standard protocol during bolus administration of intravenous contrast. Multiplanar CT image reconstructions and MIPs were obtained to evaluate the vascular anatomy. CONTRAST:  55m OMNIPAQUE IOHEXOL 350 MG/ML SOLN COMPARISON:  09/03/2020 FINDINGS: Cardiovascular: There is adequate opacification of the pulmonary arterial tree. No  intraluminal filling defect identified to suggest acute pulmonary embolism. Central pulmonary arteries are of normal caliber. Coronary artery bypass grafting has been performed. Global cardiac size is within normal limits. Thinning of the left ventricular apical wall appears progressive since prior examination and may represent the sequela of interval myocardial infarction. No pericardial effusion. Mild atherosclerotic calcification noted within the thoracic aorta. No aortic aneurysm. Mediastinum/Nodes: No enlarged mediastinal, hilar, or axillary lymph nodes. Thyroid gland, trachea, and esophagus demonstrate no significant findings. Lungs/Pleura: Lungs are clear. No pleural effusion or pneumothorax. Upper Abdomen: No acute abnormality. Musculoskeletal: Cervical fusion hardware and left humeral ORIF hardware is  partially visualized. No acute bone abnormality. No lytic or blastic bone lesion. Osseous structures are age-appropriate. Review of the MIP images confirms the above findings. IMPRESSION: No pulmonary embolism. Status post coronary artery bypass grafting. Interval development of left ventricular apical thinning, possibly reflecting the sequela of interval myocardial infarction or ongoing myocardial ischemia. Correlation with cardiac enzymes and/or EKG evaluation may be helpful for further evaluation. Aortic Atherosclerosis (ICD10-I70.0). Electronically Signed   By: Fidela Salisbury M.D.   On: 02/17/2021 00:02   DG Chest Portable 1 View  Result Date: 02/16/2021 CLINICAL DATA:  Chest pain. EXAM: PORTABLE CHEST 1 VIEW COMPARISON:  One-view chest x-ray 09/30/2020. FINDINGS: Heart size normal. Lung volumes are low. Mild interstitial coarsening is stable. No edema or effusion is present. No focal airspace disease is present. Right shoulder and cervical spine surgery noted. Median sternotomy noted. IMPRESSION: No acute cardiopulmonary disease or significant interval change. Electronically Signed   By: San Morelle M.D.   On: 02/16/2021 18:54        Scheduled Meds:  atorvastatin  80 mg Oral Daily   carvedilol  6.25 mg Oral BID WC   clopidogrel  75 mg Oral Daily   insulin aspart  0-9 Units Subcutaneous Q4H   pantoprazole  40 mg Oral Daily   Continuous Infusions:  sodium chloride 75 mL/hr at 02/17/21 1046   heparin 1,200 Units/hr (02/17/21 1046)     LOS: 0 days   Time spent: 24mns Greater than 50% of this time was spent in counseling, explanation of diagnosis, planning of further management, and coordination of care.   Voice Recognition /Viviann Sparedictation system was used to create this note, attempts have been made to correct errors. Please contact the author with questions and/or clarifications.   FFlorencia Reasons MD PhD FACP [facility]  Available via Epic secure chat 7am-7pm for nonurgent issues Please page for urgent issues To page the attending provider between 7A-7P or the covering provider during after hours 7P-7A, please log into the web site [email or web address] and access using universal [place] password for that web site. If you do not have the password, please call the hospital operator.    02/17/2021, 2:22 PM

## 2021-02-17 NOTE — ED Provider Notes (Signed)
12:52 AM Assumed care for off going team.   Blood pressure 134/76, pulse 91, temperature 97.9 F (36.6 C), temperature source Axillary, resp. rate (!) 24, height 6' (1.829 m), weight 88.6 kg, SpO2 94 %.  See their HPI for full report but in brief pending CT imaging.  Patient with high risk chest pain.  His CT scan does show interval development of some ventricular apical thinning and to correlate with potential interval MI versus ongoing MI and to correlate with EKG and cardiac markers.  Patient's EKG and cardiac markers were reassuring but patient still reporting 10 out of 10 chest pain requesting more pain medication.  Given this we will admit patient to the hospital for chest pain work-up    Vanessa Raymore, MD 02/17/21 [phone]

## 2021-02-17 NOTE — Progress Notes (Signed)
ANTICOAGULATION CONSULT NOTE - Initial Consult  Pharmacy Consult for Heparin  Indication: chest pain/ACS  Allergies  Allergen Reactions   Aspergum [Aspirin] Anaphylaxis   Aspirin Swelling   Lisinopril Anaphylaxis   Angiotensin Receptor Blockers     Never taken but PMH of angioedema with ACE-I ( Lisinopril)   Heparin Other (See Comments)    Reaction:  Bleeding     Patient Measurements: Height: 6' (182.9 cm) Weight: 88.6 kg (195 lb 6.4 oz) IBW/kg (Calculated) : 77.6 Heparin Dosing Weight: 88.6 kg   Vital Signs: Temp: 97.9 F (36.6 C) (09/03 1820) Temp Source: Axillary (09/03 1820) BP: 125/71 (09/04 0252) Pulse Rate: 108 (09/04 0252)  Labs: Recent Labs    02/16/21 1817 02/16/21 2040  HGB 16.7  --   HCT 47.8  --   PLT 221  --   CREATININE 1.40*  --   TROPONINIHS 12 8    Estimated Creatinine Clearance: 50 mL/min (A) (by C-G formula based on SCr of 1.4 mg/dL (H)).   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  CrCl = 50 ml/min Pt was on Xarelto 20 mg PO daily at home.  Unsure of last dose.   Pt was agitated/combative so RN and Rx tech unable to determine time of last dose.   Goal of Therapy:   Heparin level 0.3-0.7 units/ml aPTT 66 - 102 seconds Monitor platelets by anticoagulation protocol: Yes   Plan:  Unsure of when last dose of Xarelto was so will skip bolus and begin heparin infusion @ 1200 units/hr.  Will use aPTT to guide dosing until HL and aPTT are therapeutic.  Will check CBC daily.   Tytionna Cloyd D 02/17/2021,3:04 AM

## 2021-02-17 NOTE — Consult Note (Addendum)
Cardiology Consultation:   Patient ID: Bryan Scott MRN: [id number]; DOB: 17-Jan-1946  Admit date: 02/16/2021 Date of Consult: 02/17/2021  PCP:  [facility]  Cardiologist:  Bryan Bush, MD  Advanced Practice Provider:  No care team member to display Electrophysiologist:  None [id number]    Patient Profile:   Bryan Scott is a 75 y.o. male with a hx of history of CAD s/p CABG and s/p PCI/DES to the SVG-RCA 07/13/2020, paroxysmal atrial fibrillation on Xarelto, hypertension, hyperlipidemia, carotid artery stenosis, ASA allergy, cervical spinal stenosis, chronic pain syndrome, history of tobacco use, uncontrolled DM2 with complications, and who is being seen today for the evaluation of chest pain with elevated HS Tn at the request of Dr. Erlinda Hong.  History of Present Illness:   Bryan Scott is a 75 year old male with history of CAD s/p CABG (10/2004 at [facility]), hypertension, hyperlipidemia, chronic combined systolic and diastolic heart failure with recovery of EF on most recent echo. he also has an allergy to ASA and lisinopril.  He was lost to follow-up with cardiology from 2007-2021 then admitted several times from 1/12022- 09/2020. This included [place] admission 06/2020 for NSTEMI with PCI/DES of the SVG - PDA. He was admitted again 09/2020 with CP and ruled out for ACS.  Medical therapy recommended.  Imdur increased to 120 mg daily.  Between admissions, he was able to start diagnostic work-up for his mass of his pancreas, including ERCP/EUS/upper EUS.  His Eliquis was held 8/14 for his procedure 8/17.  Plavix was held 1 week prior to procedure.  Both Eliquis and Plavix restarted on 8/17.  Patient states today that he is still undergoing work-up regarding his GI findings /blockages found.  On 02/16/2021, he presented to [facility] with report of left-sided, sharp CP that radiated to his L arm and started earlier that same evening evening while driving.  With the  start of his CP, associated symptoms included nausea, multiple episodes of emesis, diaphoresis, dizziness, SOB.  Emesis "was milky in color."  CP described as pleuritic, positional, and more severe than before his prior stents but otherwise similar in character and very concerning to him as cardiac in etiology. CP initially waxed and waned after his first episode, allowing him to drive himself to the ED.  It then progressed to constant CP. Some relief with recent painkillers.  No alcohol or recent tobacco use.  He does report poor oral intake / sleep.  He denies any s/sx of overload, as well as any s/sx of bleeding. He reports a syncopal episode as below.   In the ED, Initial vitals 165/120, 82 bpm, RR 20.  HS Tn 12, 8.  EKG without acute ST/T changes.  Chest x-ray without changes.  CTA without evidence of PE but noted interval thinning of the LV apex.  He was given a GI cocktail without improvement in pain.  He received Ativan, morphine, Dilaudid, IV fluids, IV heparin.  Subsequent nursing notes indicate an episode during which time he was found on the floor and covered in urine, which pt reports today occurred due to a syncopal episode with loss of consciousness of unknown length of time.  He states he did not hit his head.  He reports feeling dizzy before the fall.  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   GERD (gastroesophageal reflux disease)    Heparin allergy    Bleeding   History of tobacco use    Hx of CABG    Hx of cardiac cath    Hyperlipidemia    Hypertension    Ischemic cardiomyopathy    Paroxysmal atrial fibrillation (HCC)    On Xarelto anticoagulation   Type 2 diabetes mellitus ([place])    A1C  6.8% in 01/2013    Past Surgical History:  Procedure Laterality Date   CARDIAC CATHETERIZATION     CORONARY ARTERY BYPASS GRAFT  2007   CORONARY STENT INTERVENTION N/A 07/13/2020   Procedure: CORONARY STENT INTERVENTION;  Surgeon: Bryan Bush, MD;  Location: [place] [facility];  Service: Cardiovascular;  Laterality: N/A;   ESOPHAGOGASTRODUODENOSCOPY (EGD) WITH PROPOFOL N/A 09/20/2020   Procedure: ESOPHAGOGASTRODUODENOSCOPY (EGD) WITH PROPOFOL;  Surgeon: Ladene Artist, MD;  Location: [facility] ENDOS[facility];  Service: Endoscopy;  Laterality: N/A;   LEFT HEART CATH AND CORS/GRAFTS ANGIOGRAPHY N/A 07/13/2020   Procedure: LEFT HEART CATH AND CORS/GRAFTS ANGIOGRAPHY;  Surgeon: Minna Merritts, MD;  Location: [place] [facility];  Service: Cardiovascular;  Laterality: N/A;     Home Medications:  Prior to Admission medications   Medication Sig Start Date End Date Taking? Authorizing Provider  acetaminophen (TYLENOL) 325 MG tablet Take 2 tablets (650 mg total) by mouth every 6 (six) hours. Patient taking differently: Take 500-2,000 mg by mouth every 6 (six) hours. 09/25/20  Yes Welborn, Ryan, DO  atorvastatin (LIPITOR) 80 MG tablet Take 1 tablet (80 mg total) by mouth daily. 10/11/20  Yes Medina-Vargas, Monina C, NP  carvedilol (COREG) 6.25 MG tablet Take 1 tablet (6.25 mg total) by mouth 2 (two) times daily with a meal. 10/11/20  Yes Medina-Vargas, Monina C, NP  clopidogrel (PLAVIX) 75 MG tablet Take 1 tablet (75 mg total) by mouth every morning. 10/11/20  Yes Medina-Vargas, Monina C, NP  DULoxetine (CYMBALTA) 30 MG capsule Take 1 capsule (30 mg total) by mouth daily. 10/11/20  Yes Medina-Vargas, Monina C, NP  empagliflozin (JARDIANCE) 25 MG TABS tablet Take 1 tablet (25 mg total) by mouth daily. 10/11/20  Yes Medina-Vargas, Monina C, NP  ezetimibe (ZETIA) 10 MG tablet Take 1 tablet (10 mg total) by mouth daily. 10/11/20  Yes Medina-Vargas, Monina C, NP  gabapentin (NEURONTIN) 100 MG capsule Take 1 capsule  (100 mg total) by mouth 2 (two) times daily. Patient taking differently: Take 100 mg by mouth 3 (three) times daily. 10/11/20  Yes Medina-Vargas, Monina C, NP  isosorbide mononitrate (IMDUR) 60 MG 24 hr tablet Take 1 tablet (60 mg total) by mouth daily. 10/11/20  Yes Medina-Vargas, Monina C, NP  melatonin 5 MG TABS Take 1 tablet (5 mg total) by mouth at bedtime. 10/11/20  Yes Medina-Vargas, Monina C, NP  metFORMIN (GLUCOPHAGE) 500 MG tablet Take 1 tablet (500 mg total) by mouth 2 (two) times daily with a meal. Patient taking differently: Take 500 mg by mouth daily with breakfast. 10/11/20  Yes Medina-Vargas, Monina C, NP  Multiple Vitamin (MULTIVITAMIN WITH MINERALS) TABS tablet Take 1 tablet by mouth daily. 08/10/20  Yes Fritzi Mandes, MD  neomycin-bacitracin-polymyxin (NEOSPORIN) ointment Apply 1 application topically as needed for wound care (cuts and scrapes).   Yes [provider]  nitroGLYCERIN (NITROSTAT) 0.4 MG SL tablet Place 1 tablet (0.4 mg total) under the tongue every 5 (five) minutes as needed for chest pain. 10/11/20  Yes Medina-Vargas, Monina C, NP  oxymetazoline (AFRIN) 0.05 % nasal spray Place 2 sprays into both nostrils 2 (two) times daily as needed for congestion.   Yes [provider]  pantoprazole (PROTONIX) 40 MG tablet Take 1 tablet (40 mg total) by mouth daily. Patient taking differently: Take 40 mg by mouth in the morning, at noon, in the evening, and at bedtime. 10/11/20  Yes Medina-Vargas, Monina C, NP  polyethylene glycol (MIRALAX / GLYCOLAX) 17 g packet Take 17 g by mouth daily. 09/25/20  Yes Welborn, Ryan, DO  rivaroxaban (XARELTO) 20 MG TABS tablet Take 1 tablet (20 mg total) by mouth daily with supper. 10/11/20  Yes Medina-Vargas, Monina C, NP  senna-docusate (SENOKOT-S) 8.6-50 MG tablet Take 2 tablets by mouth 2 (two) times daily. 09/06/20  Yes Sharen Hones, MD  spironolactone (ALDACTONE) 25 MG tablet Take 1 tablet (25 mg total) by mouth daily. 10/11/20  Yes  Medina-Vargas, Monina C, NP  sucralfate (CARAFATE) 1 g tablet Take 1 tablet (1 g total) by mouth 4 (four) times daily -  with meals and at bedtime. 10/11/20  Yes Medina-Vargas, Monina C, NP  Tetrahydrozoline HCl (VISINE OP) Place 1 drop into both eyes daily as needed (dry red eyes).   Yes [provider]  Nutritional Supplements (FEEDING SUPPLEMENT, GLUCERNA 1.2 CAL,) LIQD Take 237 mLs by mouth daily in the afternoon.    [provider]    Inpatient Medications: Scheduled Meds:  atorvastatin  80 mg Oral Daily   carvedilol  6.25 mg Oral BID WC   clopidogrel  75 mg Oral Daily   insulin aspart  0-9 Units Subcutaneous Q4H   pantoprazole  40 mg Oral Daily   Continuous Infusions:  sodium chloride     heparin 1,200 Units/hr (02/17/21 0728)   PRN Meds: acetaminophen, alum & mag hydroxide-simeth, nitroGLYCERIN, ondansetron (ZOFRAN) IV, oxyCODONE  Allergies:    Allergies  Allergen Reactions   Aspergum [Aspirin] Anaphylaxis   Aspirin Swelling   Lisinopril Anaphylaxis   Angiotensin Receptor Blockers     Never taken but PMH of angioedema with ACE-I ( Lisinopril)   Heparin Other (See Comments)    Reaction:  Bleeding     Social History:   Social History   Socioeconomic History   Marital status: Single    Spouse name: Not on file   Number of children: Not on file   Years of education: Not on file   Highest education level: Not on file  Occupational History   Occupation: retired  Tobacco Use   Smoking status: Former    Packs/day: 1.00    Years: 30.00    Pack years: 30.00    Types: Cigarettes   Smokeless tobacco: Never   Tobacco comments:    30 pack-year history. Quit 8-9 years ago.   Vaping Use   Vaping Use: Never used  Substance and Sexual Activity   Alcohol use: Not Currently   Drug use: Never   Sexual activity: Not on file  Other Topics Concern   Not on file  Social History Narrative   ** Merged History Encounter **       Social Determinants of  Health   Financial Resource Strain: Not on file  Food Insecurity: Not on file  Transportation Needs: Not on file  Physical Activity: Not on file  Stress: Not on file  Social Connections: Not on file  Intimate Partner Violence: Not on file    Family History:    Family History  Problem Relation Age of Onset   Heart disease Mother 59       Deceased   CVA Mother    Seizures Father 22       Deceased from complications with seizures     ROS:  Please see the history of present illness.  Review of Systems  Constitutional:  Positive for malaise/fatigue.  Respiratory:  Positive for shortness of breath. Negative for cough, hemoptysis and sputum production.   Cardiovascular:  Positive for chest pain. Negative for palpitations, orthopnea, leg swelling and PND.  Gastrointestinal:  Positive for abdominal pain, nausea and vomiting. Negative for blood in stool.  Musculoskeletal:  Positive for falls and myalgias.  Neurological:  Positive for dizziness, loss of consciousness and weakness.  Psychiatric/Behavioral:         Reports poor memory at times.  All other systems reviewed and are negative.  All other ROS reviewed and negative.     Physical Exam/Data:   Vitals:   02/17/21 0700 02/17/21 0730 02/17/21 0800 02/17/21 0830  BP: (!) 152/77 135/76 (!) 167/74 (!) 153/77  Pulse: 85 84 98 86  Resp: 20 (!) 22 (!) 21 16  Temp:      TempSrc:      SpO2: 97% 97% 95% 96%  Weight:      Height:        Intake/Output Summary (Last 24 hours) at 02/17/2021 0920 Last data filed at 02/17/2021 0728 Gross per 24 hour  Intake 479.19 ml  Output --  Net 479.19 ml   Last 3 Weights 02/16/2021 10/10/2020 10/09/2020  Weight (lbs) 195 lb 6.4 oz 170 lb 170 lb 3.2 oz  Weight (kg) 88.633 kg 77.111 kg 77.202 kg     Body mass index is 26.5 kg/m.  General: Elderly and cachectic male, NAD HEENT: normal Neck: + JVD Vascular: No carotid bruits; radial pulses 2+ bilaterally Cardiac:  normal S1, S2; tachycardic but  regular; 1/6 systolic murmur.  Pain not TTP during palpation of chest today. Lungs: Bilateral crackles,R>L Abd: Slightly firm, distended.  Not TTP Ext: no significant pitting edema Musculoskeletal:  No deformities, BUE and BLE strength normal and equal Skin: warm and dry  Neuro:  no focal abnormalities noted Psych:  Normal affect   EKG:  The EKG was personally reviewed and demonstrates:  NSR, 83bpm, poor R wave progression/inferior infarct, poor R wave progression V3, prolonged PR interval at 192 ms. Subsequent Sinus tachycardia, 115 bpm, RAD/repol changes. Telemetry:  Telemetry was personally reviewed and demonstrates: NSR-ST  Relevant CV Studies:  Echo 08/2020 1. Left ventricular ejection fraction, by estimation, is 70 to 75%. The  left ventricle has hyperdynamic function. Left ventricular endocardial  border not optimally defined to evaluate regional wall motion. Left  ventricular diastolic parameters are  consistent with Grade I diastolic dysfunction (impaired relaxation).   2. Right ventricular systolic function is normal. The right ventricular  size is normal.   3. The mitral valve is grossly normal. Trivial mitral valve  regurgitation. No evidence of mitral stenosis.   4. The aortic valve is tricuspid. There is mild calcification of the  aortic valve. There is mild thickening of the aortic valve. Aortic valve  regurgitation is not visualized. Mild to moderate aortic valve  sclerosis/calcification is present, without any  evidence of aortic stenosis.   5. The inferior vena cava is normal in size with  greater than 50%  respiratory variability, suggesting right atrial pressure of 3 mmHg.   LHC with PCI 07/13/20 Prox RCA to Dist RCA lesion is 100% stenosed. Dist Graft lesion is 95% stenosed. Ost LM to Mid LM lesion is 40% stenosed. Ost Cx to Prox Cx lesion is 80% stenosed. Mid Graft lesion is 20% stenosed. Mid Cx lesion is 100% stenosed. RPDA lesion is 90% stenosed. Origin  lesion is 100% stenosed. The left ventricular ejection fraction is greater than 65% by visual estimate. LV end diastolic pressure is normal. The left ventricular systolic function is normal. There is no aortic valve stenosis. Coronary Diagrams     Diagnostic Dominance: Right         Conclusions: Multivessel coronary artery and bypass graft disease, as detailed in Dr. Donivan Scull diagnostic catheterization report.  Culprit lesion appears to be 95% stenosis in SVG-RCA. Successful PCI to SVG-RCA using Resolute Onyx 3.5 x 18 mm drug-eluting stent with 0% residual stenosis and TIMI-3 flow. Recommendations: Remove right femoral artery sheath 2 hours after discontinuation of bivalirudin. Restart heparin 8 hours after sheath removal.  Transition to rivaroxaban tomorrow if no evidence of bleeding/vascular complication. Continue clopidogrel for 12 months (defer aspirin in the setting of aspirin allergy on chronic anticoagulation with rivaroxaban). Aggressive secondary prevention.   Echo 09/09/19  1. Left ventricular ejection fraction, by estimation, is 40 to 45%. The  left ventricle has mildly decreased function. The left ventricle has no  regional wall motion abnormalities. Left ventricular diastolic parameters  are consistent with Grade I  diastolic dysfunction (impaired relaxation).   2. Right ventricular systolic function is normal. The right ventricular  size is normal.   3. The mitral valve is normal in structure. Trivial mitral valve  regurgitation. No evidence of mitral stenosis.   4. The aortic valve is normal in structure. Aortic valve regurgitation is  not visualized. No aortic stenosis is present.   5. The inferior vena cava is normal in size with greater than 50%  respiratory variability, suggesting right atrial pressure of 3 mmHg.  Laboratory Data:  High Sensitivity Troponin:   Recent Labs  Lab 02/16/21 1817 02/16/21 2040  TROPONINIHS 12 8     Chemistry Recent Labs   Lab 02/16/21 1817  NA 136  K 4.1  CL 103  CO2 23  GLUCOSE 184*  BUN 21  CREATININE 1.40*  CALCIUM 9.6  GFRNONAA 52*  ANIONGAP 10    Recent Labs  Lab 02/17/21 0300  PROT 7.0  ALBUMIN 3.8  AST 24  ALT 24  ALKPHOS 53  BILITOT 3.2*   Hematology Recent Labs  Lab 02/16/21 1817  WBC 10.6*  RBC 4.90  HGB 16.7  HCT 47.8  MCV 97.6  MCH 34.1*  MCHC 34.9  RDW 13.5  PLT 221   BNPNo results for input(s): BNP, PROBNP in the last 168 hours.  DDimer No results for input(s): DDIMER in the last 168 hours.   Radiology/Studies:  CT Angio Chest PE W and/or Wo Contrast  Result Date: 02/17/2021 CLINICAL DATA:  Left chest pain EXAM: CT ANGIOGRAPHY CHEST WITH CONTRAST TECHNIQUE: Multidetector CT imaging of the chest was performed using the standard protocol during bolus administration of intravenous contrast. Multiplanar CT image reconstructions and MIPs were obtained to evaluate the vascular anatomy. CONTRAST:  66m OMNIPAQUE IOHEXOL 350 MG/ML SOLN COMPARISON:  09/03/2020 FINDINGS: Cardiovascular: There is adequate opacification of the pulmonary arterial tree. No intraluminal filling defect identified to suggest acute pulmonary embolism. Central pulmonary arteries are of  normal caliber. Coronary artery bypass grafting has been performed. Global cardiac size is within normal limits. Thinning of the left ventricular apical wall appears progressive since prior examination and may represent the sequela of interval myocardial infarction. No pericardial effusion. Mild atherosclerotic calcification noted within the thoracic aorta. No aortic aneurysm. Mediastinum/Nodes: No enlarged mediastinal, hilar, or axillary lymph nodes. Thyroid gland, trachea, and esophagus demonstrate no significant findings. Lungs/Pleura: Lungs are clear. No pleural effusion or pneumothorax. Upper Abdomen: No acute abnormality. Musculoskeletal: Cervical fusion hardware and left humeral ORIF hardware is partially visualized. No  acute bone abnormality. No lytic or blastic bone lesion. Osseous structures are age-appropriate. Review of the MIP images confirms the above findings. IMPRESSION: No pulmonary embolism. Status post coronary artery bypass grafting. Interval development of left ventricular apical thinning, possibly reflecting the sequela of interval myocardial infarction or ongoing myocardial ischemia. Correlation with cardiac enzymes and/or EKG evaluation may be helpful for further evaluation. Aortic Atherosclerosis (ICD10-I70.0). Electronically Signed   By: Fidela Salisbury M.D.   On: 02/17/2021 00:02   DG Chest Portable 1 View  Result Date: 02/16/2021 CLINICAL DATA:  Chest pain. EXAM: PORTABLE CHEST 1 VIEW COMPARISON:  One-view chest x-ray 09/30/2020. FINDINGS: Heart size normal. Lung volumes are low. Mild interstitial coarsening is stable. No edema or effusion is present. No focal airspace disease is present. Right shoulder and cervical spine surgery noted. Median sternotomy noted. IMPRESSION: No acute cardiopulmonary disease or significant interval change. Electronically Signed   By: San Morelle M.D.   On: 02/16/2021 18:54     Assessment and Plan:   Elevated HS Tn, suspected supply demand ischemia --Reports left-sided chest pain as above.  HS Tn minimally elevated, flat trending.  EKG without acute ST/T changes.  CTA negative for PE but shows LV thinning apex.  History of CAD / prior CABG.  08/2020 EF 70-75%, G1DD.  07/13/2020 LHC with PCI to SVG-RCA.  -- Suspect supply demand ischemia. Minimal troponin elevation in the setting of emesis, tachycardic rate, HTN, mild AKI, GI distress. Not consistent with ACS. Continue medical management.   Continue Plavix, Coreg, statin, Zetia, Imdur, PRN SL nitro No ASA --> allergy. Daily BMET, CBC No plan for invasive ischemic work-up at this time.   IV heparin started for PAF (holding Xarelto).  If echo without acute structural changes, patient can likely be discharged  home from a cardiac standpoint and pending MD attestation.  Close OP follow-up recommended.   Apical thinning could be 2/2 previous MI but could consider cardiac MRI / cCT for further evaluation as OP if indicated.   Patient reported syncopal episode --Reports syncope in the ED with preceding dizziness.  He states he did not hit his head but also that his memory is unclear. Considered is syncope 2/2 orthostatic hypotension as reported poor oral intake/dehydration and likely exacerbated by emesis and tachycardic rate.  Vasovagal etiology considered, given reported emesis, though he denies any emesis immediately before his syncope yesterday.  Also considered is syncope in the setting of his current pancreatic mass/GI issues and associated deconditioning and weakness. Check TSH, CBC, BMET. Maintain electrolytes at goal.  Continue to monitor on telemetry  / vitals Could consider outpatient cardiac monitoring. Could consider head CT per IM given syncope above on anticoagulation, though patient reports he did not hit his head.  HFrEF with recovery of EF by recent echo --Reports shortness of breath at the time of his chest pain, resolved at this time.  08/2020 EF recovery to 70-75%.  Relatively  euvolemic on exam.  Received fluids since admitted -caution with ongoing fluids. Continue medical management.  Continue Coreg Reported intolerance to ACE inhibitors PTA spironolactone held due to AKI  Restart if renal function stable - ordered BMET for today.   Paroxysmal atrial fibrillation with RVR --NSR-ST. Continue Coreg, IV heparin in lieu of Xarelto 20 mg daily.  Restart Xarelto before discharge if no upcoming procedures.  Transition back to Xarelto if no further procedures or concern for bleeding.  Daily CBC.   HTN --Continue Coreg.  Obtain BMET with restart of spironolactone if stable renal function on BMET ordered this morning.  HLD, LDL goal below 70 --Continue Lipitor 80 mg daily.  LFTs WNL.   Continue Zetia.   Pancreatic mass/abdominal pain Multiple episodes of emesis --Per IM/GI.     DM2 with complications -SSI. Glycemic control recommended for risk factor modification. Restart PTA medications per IM.   Anxiety -Per IM.   For questions or updates, please contact [place] Please consult [email or web address] for contact info under    Signed, Arvil Chaco, PA-C  02/17/2021 9:20 AM

## 2021-02-17 NOTE — ED Notes (Signed)
Pt stood on the side of the bed with stand by assist to void

## 2021-02-18 DIAGNOSIS — N289 Disorder of kidney and ureter, unspecified: Secondary | ICD-10-CM

## 2021-02-18 DIAGNOSIS — K219 Gastro-esophageal reflux disease without esophagitis: Secondary | ICD-10-CM | POA: Diagnosis not present

## 2021-02-18 DIAGNOSIS — I25118 Atherosclerotic heart disease of native coronary artery with other forms of angina pectoris: Secondary | ICD-10-CM | POA: Diagnosis not present

## 2021-02-18 DIAGNOSIS — I480 Paroxysmal atrial fibrillation: Secondary | ICD-10-CM | POA: Diagnosis not present

## 2021-02-18 DIAGNOSIS — R0789 Other chest pain: Secondary | ICD-10-CM | POA: Diagnosis not present

## 2021-02-18 LAB — GLUCOSE, CAPILLARY
Glucose-Capillary: 168 mg/dL — ABNORMAL HIGH (ref 70–99)
Glucose-Capillary: 142 mg/dL — ABNORMAL HIGH (ref 70–99)
Glucose-Capillary: 210 mg/dL — ABNORMAL HIGH (ref 70–99)
Glucose-Capillary: 142 mg/dL — ABNORMAL HIGH (ref 70–99)
Glucose-Capillary: 177 mg/dL — ABNORMAL HIGH (ref 70–99)

## 2021-02-18 LAB — CBC
HCT: 45.1 % (ref 39.0–52.0)
Hemoglobin: 15.5 g/dL (ref 13.0–17.0)
MCH: 33.2 pg (ref 26.0–34.0)
MCHC: 34.4 g/dL (ref 30.0–36.0)
MCV: 96.6 fL (ref 80.0–100.0)
Platelets: 142 10*3/uL — ABNORMAL LOW (ref 150–400)
RBC: 4.67 MIL/uL (ref 4.22–5.81)
RDW: 13.6 % (ref 11.5–15.5)
WBC: 7.2 10*3/uL (ref 4.0–10.5)
nRBC: 0 % (ref 0.0–0.2)

## 2021-02-18 NOTE — TOC Initial Note (Signed)
Transition of Care [facility]) - Initial/Assessment Note    Patient Details  Name: Bryan Scott MRN: [id number] Date of Birth: Apr 26, 1946  Transition of [facility]) CM/SW Contact:    Alberteen Sam, LCSW Phone Number: 02/18/2021, 12:23 PM  Clinical Narrative:                  CSW met with patient at bedside to review PT/OT recs of SNF at discharge. He reports he is in agreement with going to SNF with preference of going to [facility] or [facility]. CSW informed him that those two facilities are not in network with his [place] insurance so we will be utilizing his [employer] or [employer], he reports he is okay with this.   CSW has sent referrals pending bed offers at this time.   Expected Discharge Plan: Skilled Nursing Facility Barriers to Discharge: SNF Pending bed offer   Patient Goals and CMS Choice Patient states their goals for this hospitalization and ongoing recovery are:: to go to SNF CMS Medicare.gov Compare Post Acute Care list provided to:: Patient Choice offered to / list presented to : Patient  Expected Discharge Plan and Services Expected Discharge Plan: [place]       Living arrangements for the past 2 months: Single Family Home                                      Prior Living Arrangements/Services Living arrangements for the past 2 months: Single Family Home Lives with:: Self Patient language and need for interpreter reviewed:: Yes Do you feel safe going back to the place where you live?: No   needs short term rehab  Need for Family Participation in Patient Care: Yes (Comment) Care giver support system in place?: Yes (comment)   Criminal Activity/Legal Involvement Pertinent to Current Situation/Hospitalization: No - Comment as needed  Activities of Daily Living Home Assistive Devices/Equipment: None ADL Screening (condition at time of admission) Patient's cognitive ability adequate to safely complete daily activities?: No Is the patient deaf or have  difficulty hearing?: No Does the patient have difficulty seeing, even when wearing glasses/contacts?: No Does the patient have difficulty concentrating, remembering, or making decisions?: No Patient able to express need for assistance with ADLs?: Yes Does the patient have difficulty dressing or bathing?: No Independently performs ADLs?: Yes (appropriate for developmental age) Does the patient have difficulty walking or climbing stairs?: No Weakness of Legs: None Weakness of Arms/Hands: None  Permission Sought/Granted         Permission granted to share info w AGENCY: SNFs        Emotional Assessment Appearance:: Appears stated age Attitude/Demeanor/Rapport: Gracious Affect (typically observed): Calm Orientation: : Oriented to Self, Oriented to Place, Oriented to  Time, Oriented to Situation Alcohol / Substance Use: Not Applicable Psych Involvement: No (comment)  Admission diagnosis:  Chest pain [R07.9] Patient Active Problem List   Diagnosis Date Noted   Chest pain 02/17/2021   Renal insufficiency 02/17/2021   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   Non-intractable vomiting    Dehydration    Fall    Injury    Malnutrition of moderate degree 09/19/2020   Protein-calorie malnutrition ([place]) 09/18/2020   Syncope 09/17/2020   History of pancreatitis    Cervical radiculopathy    Acute pancreatitis 08/07/2020   Hyperlipidemia associated with type 2 diabetes mellitus ([place]) 08/07/2020   NSTEMI (non-ST elevated myocardial infarction) ([place])  07/11/2020   AKI (acute kidney injury) (HCC) 07/11/2020   Acute metabolic encephalopathy 09/09/2019   GERD (gastroesophageal reflux disease) 09/09/2019   CAD (coronary artery disease) 09/09/2019   Chronic pain syndrome 09/09/2019   Leukocytosis 09/09/2019   Shortness of breath    Hyponatremia 09/26/2016   Left-sided chest pain 09/22/2016   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   Abnormal EKG 09/21/2016   Chronic  pain 09/21/2016   PAF (paroxysmal atrial fibrillation) (HCC) 09/21/2016   Essential hypertension 05/02/2016   Carotid stenosis 05/02/2016   CAD in native artery    Atypical chest pain    DM (diabetes mellitus), type 2 with peripheral vascular complications (HCC)    Hyperlipidemia    Pain of left upper extremity    Angina pectoris (HCC)    Abdominal pain    Chest pain, central 03/13/2015   Hypertensive urgency 03/13/2015   Spinal stenosis 01/10/2012   PCP:  [facility]:   [facility] - [place], [place] - [address] [place] [address] Phone: [phone] Fax: [phone]  [facility] - [place], [place] - [address] [place] [address] Phone: [phone] Fax: [phone]     Social Determinants of Health (SDOH) Interventions    Readmission Risk Interventions No flowsheet data found.   

## 2021-02-18 NOTE — Progress Notes (Signed)
PROGRESS NOTE    Bryan Scott  [id number] DOB: [birth year] DOA: 02/16/2021 [facility]CP: [facility]   Chief Complain: Chest pain  Brief Narrative: Patient is a 75 year old male with history of type 2 diabetes, paroxysmal A. fib on Xarelto, coronary artery disease status post CABG, PCI, chronic pain who presented to the emergency department with chest pain.  On presentation, EKG showed normal sinus rhythm with old inferior infarct.  Troponins were normal.  Cardiology was consulted.  Chest pain was atypical, he had reproducible chest pain on palpation.  Started on Ranexa by cardiology because of his chronic multivessel coronary artery disease.  He was initially started on heparin drip which has been stopped.  Patient is on aspirin, Plavix, Lipitor and Coreg.  No further work-up of as per cardiology.  PT/OT recommended skilled nursing facility on discharge.  Medically stable for discharge as soon as bed is available at SNF.TOC made aware  Assessment & Plan:   Principal Problem:   Chest pain Active Problems:   PAF (paroxysmal atrial fibrillation) (HCC)   GERD (gastroesophageal reflux disease)   CAD (coronary artery disease)   Renal insufficiency   Chest pain: Low suspicion for acute coronary syndrome.  Heparin drip has been stopped.  Chest pain has resolved.  No further work-up planned. His chest pain is most likely acid with musculoskeletal etiology. On presentation, EKG showed normal sinus rhythm with old inferior infarct.  Troponins were normal. Continue supportive care, pain management  History of coronary artery disease: History of multivessel disease, status post CABG, status post PCI.  On aspirin, Plavix, Lipitor, Coreg at home.  Added Ranexa.  We recommend to follow-up with cardiology as an outpatient.  Paroxysmal A. fib: Currently in normal sinus rhythm.  On Xarelto for anticoagulation.  On Coreg for rate control.  Monitor on telemetry.  AKI and CKD stage II: Currently kidney  function at baseline.  He appeared dehydrated on presentation, treated with IV fluids.  Hypertension: Blood pressure is low normal.  He takes Imdur, spironolactone at home which are on hold.  Diabetes type 2: Recent hemoglobin of 6.9.  Continue sliding-scale insulin.  Monitor blood sugars  Elevated lipase: Unclear etiology.  He was recently evaluated by GI and had ERCP and endoscopic ultrasound, pancreas was found to be normal.  He denies any abdominal pain.  Generalized weakness/weight loss/debility debility/deconditioning: Lives at home, alone.  PT/OT saw him here and recommended skilled nursing  facility.         DVT prophylaxis:Xarelto Code Status: Full Family Communication: None at the bedside Status is: Observation  The patient remains OBS appropriate and will d/c before 2 midnights.  Dispo: The patient is from: Home              Anticipated d/c is to: SNF              Patient currently is medically stable for dc   Difficult to place patient :No     Consultants: Cardiology  Procedures:None  Antimicrobials:  Anti-infectives (From admission, onward)    None       Subjective: Patient seen and examined at the bedside this morning.  Hemodynamically stable.  Sitting on the chair.  Denies any chest pain today.  Denies any complaints other than weakness  Objective: Vitals:   02/17/21 1953 02/17/21 2357 02/18/21 0410 02/18/21 0827  BP: 124/66 (!) 142/77 (!) 156/95 (!) 144/97  Pulse: 73 67 79 70  Resp: '17 18 16 17  '$ Temp: 98.6 F (  37 C) (!) 97.1 F (36.2 C) 98.7 F (37.1 C) 98.1 F (36.7 C)  TempSrc: Oral  Oral   SpO2: 95% 95% 96% 98%  Weight:      Height:        Intake/Output Summary (Last 24 hours) at 02/18/2021 0949 Last data filed at 02/18/2021 0827 Gross per 24 hour  Intake 1855.04 ml  Output 1475 ml  Net 380.04 ml   Filed Weights   02/16/21 1815  Weight: 88.6 kg    Examination:  General exam: Overall comfortable, not in distress HEENT:  PERRL Respiratory system:  no wheezes or crackles  Cardiovascular system: S1 & S2 heard, RRR.  Gastrointestinal system: Abdomen is nondistended, soft and nontender. Central nervous system: Alert and oriented Extremities: No edema, no clubbing ,no cyanosis Skin: No rashes, no ulcers,no icterus      Data Reviewed: I have personally reviewed following labs and imaging studies  CBC: Recent Labs  Lab 02/16/21 1817 02/18/21 0611  WBC 10.6* 7.2  NEUTROABS 8.7*  --   HGB 16.7 15.5  HCT 47.8 45.1  MCV 97.6 96.6  PLT 221 [id number]*   Basic Metabolic Panel: Recent Labs  Lab 02/16/21 1817 02/17/21 1147  NA 136 138  K 4.1 3.7  CL 103 111  CO2 23 20*  GLUCOSE 184* 142*  BUN 21 21  CREATININE 1.40* 0.78  CALCIUM 9.6 7.3*   GFR: Estimated Creatinine Clearance: 87.6 mL/min (by C-G formula based on SCr of 0.78 mg/dL). Liver Function Tests: Recent Labs  Lab 02/17/21 0300  AST 24  ALT 24  ALKPHOS 53  BILITOT 3.2*  PROT 7.0  ALBUMIN 3.8   Recent Labs  Lab 02/17/21 0300  LIPASE 250*   No results for input(s): AMMONIA in the last 168 hours. Coagulation Profile: Recent Labs  Lab 02/17/21 0300  INR 1.2   Cardiac Enzymes: No results for input(s): CKTOTAL, CKMB, CKMBINDEX, TROPONINI in the last 168 hours. BNP (last 3 results) No results for input(s): PROBNP in the last 8760 hours. HbA1C: No results for input(s): HGBA1C in the last 72 hours. CBG: Recent Labs  Lab 02/17/21 1606 02/17/21 2040 02/17/21 2354 02/18/21 0411 02/18/21 0829  GLUCAP 134* 158* 143* 168* 142*   Lipid Profile: Recent Labs    02/17/21 0300  CHOL 108  HDL 52  LDLCALC 26  TRIG 149  CHOLHDL 2.1   Thyroid Function Tests: Recent Labs    02/17/21 1147  TSH 0.703   Anemia Panel: No results for input(s): VITAMINB12, FOLATE, FERRITIN, TIBC, IRON, RETICCTPCT in the last 72 hours. Sepsis Labs: No results for input(s): PROCALCITON, LATICACIDVEN in the last 168 hours.  No results found for this  or any previous visit (from the past 240 hour(s)).       Radiology Studies: CT Angio Chest PE W and/or Wo Contrast  Result Date: 02/17/2021 CLINICAL DATA:  Left chest pain EXAM: CT ANGIOGRAPHY CHEST WITH CONTRAST TECHNIQUE: Multidetector CT imaging of the chest was performed using the standard protocol during bolus administration of intravenous contrast. Multiplanar CT image reconstructions and MIPs were obtained to evaluate the vascular anatomy. CONTRAST:  4m OMNIPAQUE IOHEXOL 350 MG/ML SOLN COMPARISON:  09/03/2020 FINDINGS: Cardiovascular: There is adequate opacification of the pulmonary arterial tree. No intraluminal filling defect identified to suggest acute pulmonary embolism. Central pulmonary arteries are of normal caliber. Coronary artery bypass grafting has been performed. Global cardiac size is within normal limits. Thinning of the left ventricular apical wall appears progressive since prior examination and  may represent the sequela of interval myocardial infarction. No pericardial effusion. Mild atherosclerotic calcification noted within the thoracic aorta. No aortic aneurysm. Mediastinum/Nodes: No enlarged mediastinal, hilar, or axillary lymph nodes. Thyroid gland, trachea, and esophagus demonstrate no significant findings. Lungs/Pleura: Lungs are clear. No pleural effusion or pneumothorax. Upper Abdomen: No acute abnormality. Musculoskeletal: Cervical fusion hardware and left humeral ORIF hardware is partially visualized. No acute bone abnormality. No lytic or blastic bone lesion. Osseous structures are age-appropriate. Review of the MIP images confirms the above findings. IMPRESSION: No pulmonary embolism. Status post coronary artery bypass grafting. Interval development of left ventricular apical thinning, possibly reflecting the sequela of interval myocardial infarction or ongoing myocardial ischemia. Correlation with cardiac enzymes and/or EKG evaluation may be helpful for further  evaluation. Aortic Atherosclerosis (ICD10-I70.0). Electronically Signed   By: Fidela Salisbury M.D.   On: 02/17/2021 00:02   DG Chest Portable 1 View  Result Date: 02/16/2021 CLINICAL DATA:  Chest pain. EXAM: PORTABLE CHEST 1 VIEW COMPARISON:  One-view chest x-ray 09/30/2020. FINDINGS: Heart size normal. Lung volumes are low. Mild interstitial coarsening is stable. No edema or effusion is present. No focal airspace disease is present. Right shoulder and cervical spine surgery noted. Median sternotomy noted. IMPRESSION: No acute cardiopulmonary disease or significant interval change. Electronically Signed   By: San Morelle M.D.   On: 02/16/2021 18:54        Scheduled Meds:  atorvastatin  80 mg Oral Daily   carvedilol  6.25 mg Oral BID WC   clopidogrel  75 mg Oral Daily   insulin aspart  0-9 Units Subcutaneous Q4H   lidocaine  1 patch Transdermal Q24H   pantoprazole  40 mg Oral Daily   ranolazine  500 mg Oral BID   rivaroxaban  20 mg Oral Daily   Continuous Infusions:   LOS: 0 days    Time spent: More than 50% of that time was spent in counseling and/or coordination of care.      Shelly Coss, MD [facility] P9/10/2020, 9:49 AM

## 2021-02-18 NOTE — Evaluation (Signed)
Physical Therapy Evaluation Patient Details Name: Bryan Scott MRN: [id number] DOB: 28-Jul-1945 Today's Date: 02/18/2021   History of Present Illness  Pt admitted for chest pain with complaints of feeling dizzy. Of note, + fall in ED due to dizziness. History includes DM, Afib, CAD s/p CABG and stent placed. Pt reports + B LE neuropathy.  Clinical Impression  Pt is a pleasant 75 year old male who was admitted for chest pain. Pt performs bed mobility with min assist and transfers with mod assist. Unable to ambulate this date. Reports dizziness noted with position change. Orthostatics not obtained at this time as pt wanting to return to eating breakfast. Pt demonstrates deficits with strength/mobility/endurance. Pt is currently very high falls risk. Would benefit from skilled PT to address above deficits and promote optimal return to PLOF; recommend transition to STR upon discharge from acute hospitalization.     Follow Up Recommendations SNF    Equipment Recommendations   (TBD)    Recommendations for Other Services       Precautions / Restrictions Precautions Precautions: Fall Restrictions Weight Bearing Restrictions: No      Mobility  Bed Mobility Overal bed mobility: Needs Assistance Bed Mobility: Supine to Sit     Supine to sit: Min assist     General bed mobility comments: needs assist for B LEs. Once seated, dizziness noted    Transfers Overall transfer level: Needs assistance Equipment used: 1 person hand held assist Transfers: Squat Pivot Transfers     Squat pivot transfers: Mod assist     General transfer comment: unable to fully stand with B knees easily buckling. Dizziness noted. Chair positioned closely to bed. Squat pivot transfer performed with mod assist. Unable to ambulate due to dizziness noted  Ambulation/Gait             General Gait Details: not safe  Stairs            Wheelchair Mobility    Modified Rankin (Stroke Patients Only)        Balance Overall balance assessment: Needs assistance;History of Falls Sitting-balance support: Feet supported;Bilateral upper extremity supported Sitting balance-Leahy Scale: Fair     Standing balance support: No upper extremity supported Standing balance-Leahy Scale: Poor                               Pertinent Vitals/Pain Pain Assessment: No/denies pain    Home Living Family/patient expects to be discharged to:: Private residence Living Arrangements: Alone   Type of Home: House Home Access: Ramped entrance     Home Layout: One level Home Equipment: Walker - 4 wheels;Grab bars - tub/shower;Grab bars - toilet      Prior Function Level of Independence: Independent with assistive device(s)         Comments: reports he ambulated with rollator majority of time. + driving. + falls in last 6 months     Hand Dominance        Extremity/Trunk Assessment   Upper Extremity Assessment Upper Extremity Assessment: Generalized weakness (B UE grossly 4-/5)    Lower Extremity Assessment Lower Extremity Assessment: Generalized weakness (B LE grossly 3/5)       Communication   Communication: No difficulties  Cognition Arousal/Alertness: Awake/alert Behavior During Therapy: WFL for tasks assessed/performed Overall Cognitive Status: Within Functional Limits for tasks assessed  General Comments      Exercises Other Exercises Other Exercises: seated ther-ex given and reviewed including LAQ, AP, and alt marching. 10 reps   Assessment/Plan    PT Assessment Patient needs continued PT services  PT Problem List Decreased strength;Decreased activity tolerance;Decreased balance;Decreased mobility       PT Treatment Interventions Gait training;DME instruction;Therapeutic exercise;Balance training    PT Goals (Current goals can be found in the Care Plan section)  Acute Rehab PT Goals Patient Stated  Goal: to get stronger PT Goal Formulation: With patient Time For Goal Achievement: 03/04/21 Potential to Achieve Goals: Good    Frequency Min 2X/week   Barriers to discharge Decreased caregiver support      Co-evaluation               AM-PAC PT "6 Clicks" Mobility  Outcome Measure Help needed turning from your back to your side while in a flat bed without using bedrails?: A Little Help needed moving from lying on your back to sitting on the side of a flat bed without using bedrails?: A Little Help needed moving to and from a bed to a chair (including a wheelchair)?: A Lot Help needed standing up from a chair using your arms (e.g., wheelchair or bedside chair)?: A Lot Help needed to walk in hospital room?: Total Help needed climbing 3-5 steps with a railing? : Total 6 Click Score: 12    End of Session Equipment Utilized During Treatment: Gait belt Activity Tolerance: Patient limited by fatigue Patient left: in chair;with chair alarm set Nurse Communication: Mobility status PT Visit Diagnosis: Unsteadiness on feet (R26.81);Muscle weakness (generalized) (M62.81);History of falling (Z91.81);Difficulty in walking, not elsewhere classified (R26.2)    Time: [id number] PT Time Calculation (min) (ACUTE ONLY): 20 min   Charges:   PT Evaluation $PT Eval Low Complexity: 1 Low PT Treatments $Therapeutic Exercise: 8-22 mins        Greggory Stallion, PT, DPT [phone]   Solina Heron 02/18/2021, 10:31 AM

## 2021-02-18 NOTE — NC FL2 (Signed)
[place] LEVEL OF CARE SCREENING TOOL     IDENTIFICATION  Patient Name: Bryan Scott Birthdate: 11-09-1945 Sex: male Admission Date (Current Location): 02/16/2021  [facility] and [place] Number:  Engineering geologist and Address:  [facility], [address] [place],  [address]      Provider Number: [id number]  Attending Physician Name and Address:  Shelly Coss, MD  Relative Name and Phone Number:  Corky Sing (sister) [phone]    Current Level of Care: Hospital Recommended Level of Care: [place] Prior Approval Number:    Date Approved/Denied:   PASRR Number: [id number]  Discharge Plan: SNF    Current Diagnoses: Patient Active Problem List   Diagnosis Date Noted   Chest pain 02/17/2021   Renal insufficiency 02/17/2021   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   Non-intractable vomiting    Dehydration    Fall    Injury    Malnutrition of moderate degree 09/19/2020   Protein-calorie malnutrition ([place]) 09/18/2020   Syncope 09/17/2020   History of pancreatitis    Cervical radiculopathy    Acute pancreatitis 08/07/2020   Hyperlipidemia associated with type 2 diabetes mellitus ([place]) 08/07/2020   NSTEMI (non-ST elevated myocardial infarction) ([place]) 07/11/2020   AKI (acute kidney injury) ([place]) [id number]   Acute metabolic encephalopathy [id number]   GERD (gastroesophageal reflux disease) 09/09/2019   CAD (coronary artery disease) 09/09/2019   Chronic pain syndrome 09/09/2019   Leukocytosis 09/09/2019   Shortness of breath    Hyponatremia 09/26/2016   Left-sided chest pain 09/22/2016   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   Abnormal EKG 09/21/2016   Chronic pain 09/21/2016   PAF (paroxysmal atrial fibrillation) ([place]) 09/21/2016   Essential hypertension 05/02/2016   Carotid stenosis 05/02/2016   CAD in native artery    Atypical chest pain    DM (diabetes mellitus), type 2 with  peripheral vascular complications (HCC)    Hyperlipidemia    Pain of left upper extremity    Angina pectoris (HCC)    Abdominal pain    Chest pain, central 03/13/2015   Hypertensive urgency 03/13/2015   Spinal stenosis 01/10/2012    Orientation RESPIRATION BLADDER Height & Weight     Self, Situation, Place, Time  Normal Incontinent Weight: 195 lb 6.4 oz (88.6 kg) Height:  6' (182.9 cm)  BEHAVIORAL SYMPTOMS/MOOD NEUROLOGICAL BOWEL NUTRITION STATUS      Continent Diet (see discharge summary)  AMBULATORY STATUS COMMUNICATION OF NEEDS Skin   Limited Assist Verbally Normal                       Personal Care Assistance Level of Assistance  Bathing, Feeding, Dressing, Total care Bathing Assistance: Limited assistance Feeding assistance: Independent Dressing Assistance: Limited assistance Total Care Assistance: Limited assistance   Functional Limitations Info  Sight, Hearing, Speech Sight Info: Adequate Hearing Info: Adequate Speech Info: Adequate    SPECIAL CARE FACTORS FREQUENCY  PT (By licensed PT), OT (By licensed OT)     PT Frequency: min 4x weekly OT Frequency: min 4x weekly            Contractures Contractures Info: Not present    Additional Factors Info  Allergies, Code Status Code Status Info: full Allergies Info: aspergum, aspirin, lisinopril, angiotensin receptor blockers, heparin           Current Medications (02/18/2021):  This is the current hospital active medication list Current Facility-Administered Medications  Medication Dose Route Frequency Provider  Last Rate Last Admin   acetaminophen (TYLENOL) tablet 650 mg  650 mg Oral Q4H PRN Opyd, Ilene Qua, MD       alum & mag hydroxide-simeth (MAALOX/MYLANTA) 200-200-20 MG/5ML suspension 15 mL  15 mL Oral Q6H PRN Opyd, Ilene Qua, MD       atorvastatin (LIPITOR) tablet 80 mg  80 mg Oral Daily Opyd, Ilene Qua, MD   80 mg at 02/18/21 0804   carvedilol (COREG) tablet 6.25 mg  6.25 mg Oral BID WC Opyd,  Ilene Qua, MD   6.25 mg at 02/18/21 0805   clopidogrel (PLAVIX) tablet 75 mg  75 mg Oral Daily Opyd, Ilene Qua, MD   75 mg at 02/18/21 0804   insulin aspart (novoLOG) injection 0-9 Units  0-9 Units Subcutaneous Q4H Opyd, Ilene Qua, MD   2 Units at 02/18/21 0422   lidocaine (LIDODERM) 5 % 1 patch  1 patch Transdermal Q24H Florencia Reasons, MD   1 patch at 02/17/21 1501   nitroGLYCERIN (NITROSTAT) SL tablet 0.4 mg  0.4 mg Sublingual Q5 min PRN Opyd, Ilene Qua, MD       ondansetron (ZOFRAN) injection 4 mg  4 mg Intravenous Q6H PRN Opyd, Ilene Qua, MD   4 mg at 02/17/21 0725   oxyCODONE (Oxy IR/ROXICODONE) immediate release tablet 5-10 mg  5-10 mg Oral Q4H PRN Opyd, Ilene Qua, MD   5 mg at 02/18/21 0810   pantoprazole (PROTONIX) EC tablet 40 mg  40 mg Oral Daily Opyd, Ilene Qua, MD   40 mg at 02/18/21 0804   polyethylene glycol (MIRALAX / GLYCOLAX) packet 17 g  17 g Oral Daily PRN Opyd, Ilene Qua, MD       ranolazine (RANEXA) 12 hr tablet 500 mg  500 mg Oral BID Kate Sable, MD   500 mg at 02/18/21 0805   rivaroxaban (XARELTO) tablet 20 mg  20 mg Oral Daily Kate Sable, MD   20 mg at 02/18/21 0804   senna (SENOKOT) tablet 8.6 mg  1 tablet Oral Daily PRN Opyd, Ilene Qua, MD         Discharge Medications: Please see discharge summary for a list of discharge medications.  Relevant Imaging Results:  Relevant Lab Results:   Additional Information SSN: [id number]  Alberteen Sam, LCSW

## 2021-02-18 NOTE — Progress Notes (Signed)
Progress Note  Patient Name: Bryan Scott Date of Encounter: 02/18/2021  [facility] Cardiologist: Nelva Bush, MD   Subjective   Main complaint today is nausea vomiting, abdominal distention, constipation Not much chest pain Reports legs feel weak, did not sleep well Reports no bowel movement in the past 4 days Discussed prior cardiac catheterization, stent placement February 2022, stent to the vein graft to diagonal  Several hospitalizations since then discussed with him Cardiology has seen him during hospitalization January 2022, March 2022, April 2022 on 2 occasions.  2 of those recent admissions to [place] Medications    Scheduled Meds:  atorvastatin  80 mg Oral Daily   carvedilol  6.25 mg Oral BID WC   clopidogrel  75 mg Oral Daily   insulin aspart  0-9 Units Subcutaneous Q4H   lidocaine  1 patch Transdermal Q24H   pantoprazole  40 mg Oral Daily   ranolazine  500 mg Oral BID   rivaroxaban  20 mg Oral Daily   Continuous Infusions:  PRN Meds: acetaminophen, alum & mag hydroxide-simeth, nitroGLYCERIN, ondansetron (ZOFRAN) IV, oxyCODONE, polyethylene glycol, senna   Vital Signs    Vitals:   02/17/21 2357 02/18/21 0410 02/18/21 0827 02/18/21 1115  BP: (!) 142/77 (!) 156/95 (!) 144/97 102/66  Pulse: 67 79 70 67  Resp: '18 16 17 17  '$ Temp: (!) 97.1 F (36.2 C) 98.7 F (37.1 C) 98.1 F (36.7 C) 97.6 F (36.4 C)  TempSrc:  Oral    SpO2: 95% 96% 98% 98%  Weight:      Height:        Intake/Output Summary (Last 24 hours) at 02/18/2021 1129 Last data filed at 02/18/2021 1100 Gross per 24 hour  Intake 2086.47 ml  Output 1475 ml  Net 611.47 ml   Last 3 Weights 02/16/2021 10/10/2020 10/09/2020  Weight (lbs) 195 lb 6.4 oz 170 lb 170 lb 3.2 oz  Weight (kg) 88.633 kg 77.111 kg 77.202 kg      Telemetry    Normal sinus rhythm- Personally Reviewed  ECG    - Personally Reviewed  Physical Exam   GEN: No acute distress.   Neck: No JVD Cardiac:  RRR, no murmurs, rubs, or gallops.  Respiratory: Clear to auscultation bilaterally. GI: Soft, nontender, non-distended  MS: No edema; No deformity. Neuro:  Nonfocal  Psych: Normal affect   Labs    High Sensitivity Troponin:   Recent Labs  Lab 02/16/21 1817 02/16/21 2040  TROPONINIHS 12 8      Chemistry Recent Labs  Lab 02/16/21 1817 02/17/21 0300 02/17/21 1147  NA 136  --  138  K 4.1  --  3.7  CL 103  --  111  CO2 23  --  20*  GLUCOSE 184*  --  142*  BUN 21  --  21  CREATININE 1.40*  --  0.78  CALCIUM 9.6  --  7.3*  PROT  --  7.0  --   ALBUMIN  --  3.8  --   AST  --  24  --   ALT  --  24  --   ALKPHOS  --  53  --   BILITOT  --  3.2*  --   GFRNONAA 52*  --  >60  ANIONGAP 10  --  7     Hematology Recent Labs  Lab 02/16/21 1817 02/18/21 0611  WBC 10.6* 7.2  RBC 4.90 4.67  HGB 16.7 15.5  HCT 47.8 45.1  MCV 97.6 96.6  MCH 34.1* 33.2  MCHC 34.9 34.4  RDW 13.5 13.6  PLT 221 142*    BNP Recent Labs  Lab 02/17/21 1147  BNP 49.7     DDimer No results for input(s): DDIMER in the last 168 hours.   Radiology    CT Angio Chest PE W and/or Wo Contrast  Result Date: 02/17/2021 CLINICAL DATA:  Left chest pain EXAM: CT ANGIOGRAPHY CHEST WITH CONTRAST TECHNIQUE: Multidetector CT imaging of the chest was performed using the standard protocol during bolus administration of intravenous contrast. Multiplanar CT image reconstructions and MIPs were obtained to evaluate the vascular anatomy. CONTRAST:  54m OMNIPAQUE IOHEXOL 350 MG/ML SOLN COMPARISON:  09/03/2020 FINDINGS: Cardiovascular: There is adequate opacification of the pulmonary arterial tree. No intraluminal filling defect identified to suggest acute pulmonary embolism. Central pulmonary arteries are of normal caliber. Coronary artery bypass grafting has been performed. Global cardiac size is within normal limits. Thinning of the left ventricular apical wall appears progressive since prior examination and may  represent the sequela of interval myocardial infarction. No pericardial effusion. Mild atherosclerotic calcification noted within the thoracic aorta. No aortic aneurysm. Mediastinum/Nodes: No enlarged mediastinal, hilar, or axillary lymph nodes. Thyroid gland, trachea, and esophagus demonstrate no significant findings. Lungs/Pleura: Lungs are clear. No pleural effusion or pneumothorax. Upper Abdomen: No acute abnormality. Musculoskeletal: Cervical fusion hardware and left humeral ORIF hardware is partially visualized. No acute bone abnormality. No lytic or blastic bone lesion. Osseous structures are age-appropriate. Review of the MIP images confirms the above findings. IMPRESSION: No pulmonary embolism. Status post coronary artery bypass grafting. Interval development of left ventricular apical thinning, possibly reflecting the sequela of interval myocardial infarction or ongoing myocardial ischemia. Correlation with cardiac enzymes and/or EKG evaluation may be helpful for further evaluation. Aortic Atherosclerosis (ICD10-I70.0). Electronically Signed   By: AFidela SalisburyM.D.   On: 02/17/2021 00:02   DG Chest Portable 1 View  Result Date: 02/16/2021 CLINICAL DATA:  Chest pain. EXAM: PORTABLE CHEST 1 VIEW COMPARISON:  One-view chest x-ray 09/30/2020. FINDINGS: Heart size normal. Lung volumes are low. Mild interstitial coarsening is stable. No edema or effusion is present. No focal airspace disease is present. Right shoulder and cervical spine surgery noted. Median sternotomy noted. IMPRESSION: No acute cardiopulmonary disease or significant interval change. Electronically Signed   By: CSan MorelleM.D.   On: 02/16/2021 18:54    Cardiac Studies   Echocardiogram  1. Left ventricular ejection fraction, by estimation, is 70 to 75%. The  left ventricle has hyperdynamic function. Left ventricular endocardial  border not optimally defined to evaluate regional wall motion. Left  ventricular diastolic  parameters are  consistent with Grade I diastolic dysfunction (impaired relaxation).   2. Right ventricular systolic function is normal. The right ventricular  size is normal.  And  3. The mitral valve is grossly normal. Trivial mitral valve  regurgitation. No evidence of mitral stenosis.   4. The aortic valve is tricuspid. There is mild calcification of the  aortic valve. There is mild thickening of the aortic valve. Aortic valve  regurgitation is not visualized. Mild to moderate aortic valve  sclerosis/calcification is present, without any  evidence of aortic stenosis.   5. The inferior vena cava is normal in size with greater than 50%  respiratory variability, suggesting right atrial pressure of 3 mmHg.   Patient Profile     75year old male with history of CAD/CABG (2006), s/p PCI to RCA vein graft, paroxysmal atrial fibrillation on Xarelto,  hypertension, hyperlipidemia presenting with sudden onset chest pain.  Assessment & Plan    Chronic stable angina In the setting of known coronary disease, prior bypass, stenting to vein graft -Several hospital admissions this year (4)  -Sometimes 4 anginal symptoms, sometimes atypical chest pain, other times nausea vomiting weakness -Cardiac enzymes negative, no change in EKG, Medical management recommended, started on Ranexa yesterday -Reports no bowel movement for days, constipation, nausea vomiting -We will recommend bowel regimen, no further cardiac work-up at this time -Appears he is not on his full medication regiment.  Blood pressure has stabilized, can likely restart his Imdur in the next day or so  2.  Nausea vomiting constipation Syncope in the emergency room with orthostasis symptoms, poor intake exacerbated by his emesis -Unclear contribution from his pancreatic mass/GI pathology, deconditioning, weakness Would recommend bowel regiment Reports having chronic GERD symptoms  3.  Weakness He is open for placement in skilled  nursing facility Reports unsteady gait, needing a walker Does not want to go to [place] where he was before, would like to find something more local as he lives in [place]  4.  Paroxysmal atrial fibrillation Carvedilol, Xarelto, Maintaining normal sinus rhythm  5.  Diabetes type 2 with complications On sliding scale Can resume his outpatient medications  6.  Anxiety Suspect driven by psychosocial factors, May benefit from social worker consult to determine needs at home   Total encounter time more than 35 minutes  Greater than 50% was spent in counseling and coordination of care with the patient   For questions or updates, please contact [place] Please consult [email or web address] for contact info under        Signed, Ida Rogue, MD  02/18/2021, 11:29 AM

## 2021-02-19 DIAGNOSIS — R0789 Other chest pain: Secondary | ICD-10-CM | POA: Diagnosis not present

## 2021-02-19 LAB — LIPASE, BLOOD: Lipase: 300 U/L — ABNORMAL HIGH (ref 11–51)

## 2021-02-19 LAB — HEMOGLOBIN A1C
Hgb A1c MFr Bld: 6.8 % — ABNORMAL HIGH (ref 4.8–5.6)
Mean Plasma Glucose: 148 mg/dL

## 2021-02-19 LAB — GLUCOSE, CAPILLARY
Glucose-Capillary: 147 mg/dL — ABNORMAL HIGH (ref 70–99)
Glucose-Capillary: 157 mg/dL — ABNORMAL HIGH (ref 70–99)
Glucose-Capillary: 165 mg/dL — ABNORMAL HIGH (ref 70–99)
Glucose-Capillary: 174 mg/dL — ABNORMAL HIGH (ref 70–99)
Glucose-Capillary: 174 mg/dL — ABNORMAL HIGH (ref 70–99)

## 2021-02-19 MED ORDER — ONDANSETRON HCL 4 MG PO TABS: 4.0000 mg | ORAL_TABLET | Freq: Every day | ORAL | 0 refills | Status: AC | PRN

## 2021-02-19 MED ORDER — RANOLAZINE ER 500 MG PO TB12: 500.0000 mg | ORAL_TABLET | Freq: Two times a day (BID) | ORAL | 0 refills | Status: AC

## 2021-02-19 MED ORDER — OXYCODONE HCL 5 MG PO TABS
5.0000 mg | ORAL_TABLET | ORAL | Status: DC | PRN
  Administered 2021-02-19: 10 mg via ORAL
  Filled 2021-02-19: qty 2

## 2021-02-19 MED ORDER — ISOSORBIDE MONONITRATE ER 60 MG PO TB24: 30.0000 mg | ORAL_TABLET | Freq: Every day | ORAL | 0 refills | Status: DC

## 2021-02-19 NOTE — Progress Notes (Signed)
Patient refuses bed alarm.  Patient turns the alarm off and is noncompliant with the chair and bed alarm.

## 2021-02-19 NOTE — Plan of Care (Signed)

## 2021-02-19 NOTE — Discharge Summary (Signed)
Physician Discharge Summary  NAME SEESE [id number] DOB: 1946-01-07 DOA: 02/16/2021  [facility]P: [facility]  Admit date: 02/16/2021 Discharge date: 02/19/2021  Admitted From: Home Disposition:  Home  Discharge Condition:Stable CODE STATUS:FULL, Diet recommendation: Heart Healthy   Brief/Interim Summary:  Chest pain: Low suspicion for acute coronary syndrome.  Heparin drip has been stopped.  Chest pain has resolved.  No further work-up planned.  He has history of chronic stable angina His chest pain is most likely associated  with musculoskeletal etiology or  GERD .he was complaining of nausea.  On presentation, EKG showed normal sinus rhythm with old inferior infarct.  Troponins were normal. Continue supportive care, pain management. Continue Zofran and Protonix on discharge.   History of coronary artery disease: History of multivessel disease, status post CABG, status post PCI.  On aspirin, Plavix, Lipitor, Coreg at home.  Added Ranexa.  We recommend to follow-up with cardiology as an outpatient.   Paroxysmal A. fib: Currently in normal sinus rhythm.  On Xarelto for anticoagulation.  On Coreg for rate control.  Monitor on telemetry.   AKI and CKD stage II: Currently kidney function at baseline.  He appeared dehydrated on presentation, treated with IV fluids.   Hypertension: Blood pressure is low normal.  Dose of imdur reduced to half   Diabetes type 2: Recent hemoglobin of 6.9. On metformin at home   Elevated lipase: Unclear etiology.  He was recently evaluated by GI and had ERCP and endoscopic ultrasound, pancreas was found to be normal.  He denies any abdominal pain.   Generalized weakness/weight loss/debility debility/deconditioning: Lives at home, alone.  PT/OT saw him here and recommended skilled nursing  facility but he declined.  Home health arranged  Discharge Diagnoses:  Principal Problem:   Chest pain Active Problems:   PAF (paroxysmal atrial fibrillation)  (HCC)   GERD (gastroesophageal reflux disease)   CAD (coronary artery disease)   Renal insufficiency    Discharge Instructions  Discharge Instructions     Diet - low sodium heart healthy   Complete by: As directed    Discharge instructions   Complete by: As directed    1)Please continue your medications as instructed 2)Follow up with your PCP in a week.Monitor your blood pressure at home 3)Follow up with cardiology as an outpatient   Increase activity slowly   Complete by: As directed       Allergies as of 02/19/2021       Reactions   Aspergum [aspirin] Anaphylaxis   Aspirin Swelling   Lisinopril Anaphylaxis   Angiotensin Receptor Blockers    Never taken but PMH of angioedema with ACE-I ( Lisinopril)   Heparin Other (See Comments)   Reaction:  Bleeding         Medication List     TAKE these medications    acetaminophen 325 MG tablet Commonly known as: TYLENOL Take 2 tablets (650 mg total) by mouth every 6 (six) hours. What changed: how much to take   atorvastatin 80 MG tablet Commonly known as: LIPITOR Take 1 tablet (80 mg total) by mouth daily.   carvedilol 6.25 MG tablet Commonly known as: COREG Take 1 tablet (6.25 mg total) by mouth 2 (two) times daily with a meal.   clopidogrel 75 MG tablet Commonly known as: PLAVIX Take 1 tablet (75 mg total) by mouth every morning.   DULoxetine 30 MG capsule Commonly known as: CYMBALTA Take 1 capsule (30 mg total) by mouth daily.   empagliflozin 25 MG  Tabs tablet Commonly known as: Jardiance Take 1 tablet (25 mg total) by mouth daily.   ezetimibe 10 MG tablet Commonly known as: ZETIA Take 1 tablet (10 mg total) by mouth daily.   feeding supplement (GLUCERNA 1.2 CAL) Liqd Take 237 mLs by mouth daily in the afternoon.   gabapentin 100 MG capsule Commonly known as: NEURONTIN Take 1 capsule (100 mg total) by mouth 2 (two) times daily. What changed: when to take this   isosorbide mononitrate 60 MG 24 hr  tablet Commonly known as: IMDUR Take 0.5 tablets (30 mg total) by mouth daily. What changed: how much to take   melatonin 5 MG Tabs Take 1 tablet (5 mg total) by mouth at bedtime.   metFORMIN 500 MG tablet Commonly known as: GLUCOPHAGE Take 1 tablet (500 mg total) by mouth 2 (two) times daily with a meal. What changed: when to take this   multivitamin with minerals Tabs tablet Take 1 tablet by mouth daily.   neomycin-bacitracin-polymyxin ointment Commonly known as: NEOSPORIN Apply 1 application topically as needed for wound care (cuts and scrapes).   nitroGLYCERIN 0.4 MG SL tablet Commonly known as: NITROSTAT Place 1 tablet (0.4 mg total) under the tongue every 5 (five) minutes as needed for chest pain.   oxymetazoline 0.05 % nasal spray Commonly known as: AFRIN Place 2 sprays into both nostrils 2 (two) times daily as needed for congestion.   pantoprazole 40 MG tablet Commonly known as: PROTONIX Take 1 tablet (40 mg total) by mouth daily. What changed: when to take this   polyethylene glycol 17 g packet Commonly known as: MIRALAX / GLYCOLAX Take 17 g by mouth daily.   rivaroxaban 20 MG Tabs tablet Commonly known as: XARELTO Take 1 tablet (20 mg total) by mouth daily with supper.   senna-docusate 8.6-50 MG tablet Commonly known as: Senokot-S Take 2 tablets by mouth 2 (two) times daily.   spironolactone 25 MG tablet Commonly known as: ALDACTONE Take 1 tablet (25 mg total) by mouth daily.   sucralfate 1 g tablet Commonly known as: CARAFATE Take 1 tablet (1 g total) by mouth 4 (four) times daily -  with meals and at bedtime.   VISINE OP Place 1 drop into both eyes daily as needed (dry red eyes).        Follow-up [place]. Schedule an appointment as soon as possible for a visit in 1 week(s).   Specialty: General Practice Contact information: [place] [address] [phone]         Nelva Bush, MD .    Specialty: Cardiology Contact information: [place] [address] [phone]                Allergies  Allergen Reactions   Aspergum [Aspirin] Anaphylaxis   Aspirin Swelling   Lisinopril Anaphylaxis   Angiotensin Receptor Blockers     Never taken but PMH of angioedema with ACE-I ( Lisinopril)   Heparin Other (See Comments)    Reaction:  Bleeding     Consultations: Cardiology   Procedures/Studies: CT Angio Chest PE W and/or Wo Contrast  Result Date: 02/17/2021 CLINICAL DATA:  Left chest pain EXAM: CT ANGIOGRAPHY CHEST WITH CONTRAST TECHNIQUE: Multidetector CT imaging of the chest was performed using the standard protocol during bolus administration of intravenous contrast. Multiplanar CT image reconstructions and MIPs were obtained to evaluate the vascular anatomy. CONTRAST:  48m OMNIPAQUE IOHEXOL 350 MG/ML SOLN  COMPARISON:  09/03/2020 FINDINGS: Cardiovascular: There is adequate opacification of the pulmonary arterial tree. No intraluminal filling defect identified to suggest acute pulmonary embolism. Central pulmonary arteries are of normal caliber. Coronary artery bypass grafting has been performed. Global cardiac size is within normal limits. Thinning of the left ventricular apical wall appears progressive since prior examination and may represent the sequela of interval myocardial infarction. No pericardial effusion. Mild atherosclerotic calcification noted within the thoracic aorta. No aortic aneurysm. Mediastinum/Nodes: No enlarged mediastinal, hilar, or axillary lymph nodes. Thyroid gland, trachea, and esophagus demonstrate no significant findings. Lungs/Pleura: Lungs are clear. No pleural effusion or pneumothorax. Upper Abdomen: No acute abnormality. Musculoskeletal: Cervical fusion hardware and left humeral ORIF hardware is partially visualized. No acute bone abnormality. No lytic or blastic bone lesion. Osseous structures are age-appropriate.  Review of the MIP images confirms the above findings. IMPRESSION: No pulmonary embolism. Status post coronary artery bypass grafting. Interval development of left ventricular apical thinning, possibly reflecting the sequela of interval myocardial infarction or ongoing myocardial ischemia. Correlation with cardiac enzymes and/or EKG evaluation may be helpful for further evaluation. Aortic Atherosclerosis (ICD10-I70.0). Electronically Signed   By: Fidela Salisbury M.D.   On: 02/17/2021 00:02   DG Chest Portable 1 View  Result Date: 02/16/2021 CLINICAL DATA:  Chest pain. EXAM: PORTABLE CHEST 1 VIEW COMPARISON:  One-view chest x-ray 09/30/2020. FINDINGS: Heart size normal. Lung volumes are low. Mild interstitial coarsening is stable. No edema or effusion is present. No focal airspace disease is present. Right shoulder and cervical spine surgery noted. Median sternotomy noted. IMPRESSION: No acute cardiopulmonary disease or significant interval change. Electronically Signed   By: San Morelle M.D.   On: 02/16/2021 18:54      Subjective: Patient seen and examined the bedside this morning.  Hemodynamically stable for discharge today.  Discharge Exam: Vitals:   02/19/21 0413 02/19/21 0825  BP: (!) 155/94 109/70  Pulse: 67 73  Resp: 18 16  Temp: 97.6 F (36.4 C) 98.2 F (36.8 C)  SpO2: 98% 100%   Vitals:   02/18/21 1925 02/19/21 0012 02/19/21 0413 02/19/21 0825  BP: (!) 145/74 (!) 140/98 (!) 155/94 109/70  Pulse: 71 71 67 73  Resp: '16 18 18 16  '$ Temp: 97.8 F (36.6 C)  97.6 F (36.4 C) 98.2 F (36.8 C)  TempSrc:      SpO2: 98% 98% 98% 100%  Weight:      Height:        General: Pt is alert, awake, not in acute distress Cardiovascular: RRR, S1/S2 +, no rubs, no gallops Respiratory: CTA bilaterally, no wheezing, no rhonchi Abdominal: Soft, NT, ND, bowel sounds + Extremities: no edema, no cyanosis    The results of significant diagnostics from this hospitalization (including  imaging, microbiology, ancillary and laboratory) are listed below for reference.     Microbiology: No results found for this or any previous visit (from the past 240 hour(s)).   Labs: BNP (last 3 results) Recent Labs    08/07/20 1538 02/17/21 1147  BNP 36.4 [id number]   Basic Metabolic Panel: Recent Labs  Lab 02/16/21 1817 02/17/21 1147  NA 136 138  K 4.1 3.7  CL 103 111  CO2 23 20*  GLUCOSE 184* 142*  BUN 21 21  CREATININE 1.40* 0.78  CALCIUM 9.6 7.3*   Liver Function Tests: Recent Labs  Lab 02/17/21 0300  AST 24  ALT 24  ALKPHOS 53  BILITOT 3.2*  PROT 7.0  ALBUMIN 3.8   Recent  Labs  Lab 02/17/21 0300 02/19/21 0458  LIPASE 250* 300*   No results for input(s): AMMONIA in the last 168 hours. CBC: Recent Labs  Lab 02/16/21 1817 02/18/21 0611  WBC 10.6* 7.2  NEUTROABS 8.7*  --   HGB 16.7 15.5  HCT 47.8 45.1  MCV 97.6 96.6  PLT 221 142*   Cardiac Enzymes: No results for input(s): CKTOTAL, CKMB, CKMBINDEX, TROPONINI in the last 168 hours. BNP: Invalid input(s): POCBNP CBG: Recent Labs  Lab 02/18/21 1928 02/19/21 0044 02/19/21 0426 02/19/21 0535 02/19/21 0827  GLUCAP 177* 157* 147* 174* 174*   D-Dimer No results for input(s): DDIMER in the last 72 hours. Hgb A1c Recent Labs    02/17/21 0300  HGBA1C 6.8*   Lipid Profile Recent Labs    02/17/21 0300  CHOL 108  HDL 52  LDLCALC 26  TRIG 149  CHOLHDL 2.1   Thyroid function studies Recent Labs    02/17/21 1147  TSH 0.703   Anemia work up No results for input(s): VITAMINB12, FOLATE, FERRITIN, TIBC, IRON, RETICCTPCT in the last 72 hours. Urinalysis    Component Value Date/Time   COLORURINE YELLOW 09/18/2020 0747   APPEARANCEUR CLEAR 09/18/2020 0747   APPEARANCEUR Clear 10/15/2013 2200   LABSPEC 1.023 09/18/2020 0747   LABSPEC 1.012 10/15/2013 2200   PHURINE 5.0 09/18/2020 0747   GLUCOSEU >=500 (A) 09/18/2020 0747   GLUCOSEU Negative 10/15/2013 2200   HGBUR SMALL (A) 09/18/2020  0747   BILIRUBINUR NEGATIVE 09/18/2020 0747   BILIRUBINUR Negative 10/15/2013 2200   KETONESUR 20 (A) 09/18/2020 0747   PROTEINUR NEGATIVE 09/18/2020 0747   NITRITE NEGATIVE 09/18/2020 0747   LEUKOCYTESUR NEGATIVE 09/18/2020 0747   LEUKOCYTESUR Negative 10/15/2013 2200   Sepsis Labs Invalid input(s): PROCALCITONIN,  WBC,  LACTICIDVEN Microbiology No results found for this or any previous visit (from the past 240 hour(s)).  Please note: You were cared for by a hospitalist during your hospital stay. Once you are discharged, your primary care physician will handle any further medical issues. Please note that NO REFILLS for any discharge medications will be authorized once you are discharged, as it is imperative that you return to your primary care physician (or establish a relationship with a primary care physician if you do not have one) for your post hospital discharge needs so that they can reassess your need for medications and monitor your lab values.    Time coordinating discharge: 40 minutes  SIGNED:   Shelly Coss, MD  [facility] 02/19/2021, 11:38 AM Pager [phone]  If 7PM-7AM, please contact night-coverage [email or web address] Password [email or web address]

## 2021-02-19 NOTE — TOC Transition Note (Signed)
Transition of Care [facility]) - CM/SW Discharge Note   Patient Details  Name: Bryan Scott MRN: [id number] Date of Birth: 07-05-1945  Transition of Care [facility] CM/SW Contact:  Alberteen Sam, LCSW Phone Number: 02/19/2021, 10:13 AM   Clinical Narrative:     Patient reports he wants to go home now, is agreeable to home health with no preference of agency.   CSW has set up [facility] PT OT and RN with Corene Cornea with [place].   Patient reports no equipment needs as he has a walker and a ramp at home.   Patient reports he will drive himself home as he drove himself to the hospital and his car is here.   No other discharge needs identified at this time.    Final next level of care: [place] Barriers to Discharge: No Barriers Identified   Patient Goals and CMS Choice Patient states their goals for this hospitalization and ongoing recovery are:: to go home CMS Medicare.gov Compare Post Acute Care list provided to:: Patient Choice offered to / list presented to : Patient  Discharge Placement                    Patient and family notified of of transfer: 02/19/21  Discharge Plan and Services                          HH Arranged: PT, OT, RN [facility] Agency: [place] ([place]) Date [place]: 02/19/21 Time [place]: 1012 Representative spoke with at [place]: [place] ([place]) Interventions     Readmission Risk Interventions No flowsheet data found.

## 2021-03-14 ENCOUNTER — Ambulatory Visit: Payer: Medicare PPO | Admitting: Nurse Practitioner

## 2021-03-14 ENCOUNTER — Other Ambulatory Visit: Payer: Self-pay

## 2021-03-14 ENCOUNTER — Encounter: Payer: Self-pay | Admitting: Nurse Practitioner

## 2021-03-14 NOTE — Progress Notes (Deleted)
Office Visit    Patient Name: Bryan Scott Date of Encounter: 03/14/2021  Primary Care Provider:  [facility] Primary Cardiologist:  Nelva Bush, MD  Chief Complaint    ***  Past Medical History    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    Angioedema   Back injuries    Bladder cancer ([place])    Cancer ([place])    Carotid artery disease ([place])    > 75% bilateral ICA stenoses on 01/2013 CT   Chronic pain syndrome    Coronary artery disease    a. 2007 s/p CABG; b. 06/2020 PCI: LM 40ost, LAD nl, LCX 100p, RCA 152m - fills via L->R collats. VG->RPDA 21m/d (3.5x18 Resolute Onyx DES), VG->Diag 100, VG->OM2 ok.   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 08/2020 Echo: EF 70-75%, no rwma, Gr1 DD, nl RV fxn, triv MR, mild-mod Ao sclerosis.   Paroxysmal atrial fibrillation (HCC)    On Xarelto anticoagulation   Type 2 diabetes mellitus ([place])    A1C 6.8% in 01/2013   Past Surgical History:  Procedure Laterality Date   CARDIAC CATHETERIZATION     CORONARY ARTERY BYPASS GRAFT  2007   CORONARY STENT INTERVENTION N/A 07/13/2020   Procedure: CORONARY STENT INTERVENTION;  Surgeon: Nelva Bush, MD;  Location: [facility];  Service: Cardiovascular;  Laterality: N/A;   ESOPHAGOGASTRODUODENOSCOPY (EGD) WITH PROPOFOL N/A 09/20/2020   Procedure: ESOPHAGOGASTRODUODENOSCOPY (EGD) WITH PROPOFOL;  Surgeon: Ladene Artist, MD;  Location: [facility] ENDOSCOPY;  Service: Endoscopy;  Laterality: N/A;   LEFT HEART CATH AND CORS/GRAFTS ANGIOGRAPHY N/A 07/13/2020   Procedure: LEFT HEART CATH AND CORS/GRAFTS ANGIOGRAPHY;  Surgeon: Minna Merritts, MD;  Location: [facility];  Service: Cardiovascular;  Laterality: N/A;    Allergies  Allergies  Allergen Reactions    Aspergum [Aspirin] Anaphylaxis   Aspirin Swelling   Lisinopril Anaphylaxis   Angiotensin Receptor Blockers     Never taken but PMH of angioedema with ACE-I ( Lisinopril)   Heparin Other (See Comments)    Reaction:  Bleeding     History of Present Illness    ***  Home Medications    Current Outpatient Medications  Medication Sig Dispense Refill   acetaminophen (TYLENOL) 325 MG tablet Take 2 tablets (650 mg total) by mouth every 6 (six) hours. (Patient taking differently: Take 500-2,000 mg by mouth every 6 (six) hours.)     atorvastatin (LIPITOR) 80 MG tablet Take 1 tablet (80 mg total) by mouth daily. 30 tablet 0   carvedilol (COREG) 6.25 MG tablet Take 1 tablet (6.25 mg total) by mouth 2 (two) times daily with a meal. 60 tablet 0   clopidogrel (PLAVIX) 75 MG tablet Take 1 tablet (75 mg total) by mouth every morning. 30 tablet 0   DULoxetine (CYMBALTA) 30 MG capsule Take 1 capsule (30 mg total) by mouth daily. 30 capsule 0   empagliflozin (JARDIANCE) 25 MG TABS tablet Take 1 tablet (25 mg total) by mouth daily. 30 tablet 0   ezetimibe (ZETIA) 10 MG tablet Take 1 tablet (10 mg total) by mouth daily. 30 tablet 0   gabapentin (NEURONTIN) 100 MG capsule Take 1 capsule (100 mg total) by  mouth 2 (two) times daily. (Patient taking differently: Take 100 mg by mouth 3 (three) times daily.) 60 capsule 0   isosorbide mononitrate (IMDUR) 60 MG 24 hr tablet Take 0.5 tablets (30 mg total) by mouth daily. 30 tablet 0   melatonin 5 MG TABS Take 1 tablet (5 mg total) by mouth at bedtime. 30 tablet 0   metFORMIN (GLUCOPHAGE) 500 MG tablet Take 1 tablet (500 mg total) by mouth 2 (two) times daily with a meal. (Patient taking differently: Take 500 mg by mouth daily with breakfast.) 60 tablet 0   Multiple Vitamin (MULTIVITAMIN WITH MINERALS) TABS tablet Take 1 tablet by mouth daily. 30 tablet 0   neomycin-bacitracin-polymyxin (NEOSPORIN) ointment Apply 1 application topically as needed for wound care (cuts  and scrapes).     nitroGLYCERIN (NITROSTAT) 0.4 MG SL tablet Place 1 tablet (0.4 mg total) under the tongue every 5 (five) minutes as needed for chest pain. 30 tablet 0   Nutritional Supplements (FEEDING SUPPLEMENT, GLUCERNA 1.2 CAL,) LIQD Take 237 mLs by mouth daily in the afternoon.     ondansetron (ZOFRAN) 4 MG tablet Take 1 tablet (4 mg total) by mouth daily as needed for nausea or vomiting. 20 tablet 0   oxymetazoline (AFRIN) 0.05 % nasal spray Place 2 sprays into both nostrils 2 (two) times daily as needed for congestion.     pantoprazole (PROTONIX) 40 MG tablet Take 1 tablet (40 mg total) by mouth daily. (Patient taking differently: Take 40 mg by mouth in the morning, at noon, in the evening, and at bedtime.) 30 tablet 0   polyethylene glycol (MIRALAX / GLYCOLAX) 17 g packet Take 17 g by mouth daily. 14 each 0   ranolazine (RANEXA) 500 MG 12 hr tablet Take 1 tablet (500 mg total) by mouth 2 (two) times daily. 60 tablet 0   rivaroxaban (XARELTO) 20 MG TABS tablet Take 1 tablet (20 mg total) by mouth daily with supper. 30 tablet 0   senna-docusate (SENOKOT-S) 8.6-50 MG tablet Take 2 tablets by mouth 2 (two) times daily. 100 tablet 0   spironolactone (ALDACTONE) 25 MG tablet Take 1 tablet (25 mg total) by mouth daily. 30 tablet 0   sucralfate (CARAFATE) 1 g tablet Take 1 tablet (1 g total) by mouth 4 (four) times daily -  with meals and at bedtime. 120 tablet 0   Tetrahydrozoline HCl (VISINE OP) Place 1 drop into both eyes daily as needed (dry red eyes).     No current facility-administered medications for this visit.     Review of Systems    ***.  All other systems reviewed and are otherwise negative except as noted above.  Physical Exam    VS:  There were no vitals taken for this visit. , BMI There is no height or weight on file to calculate BMI.     GEN: Well nourished, well developed, in no acute distress. HEENT: normal. Neck: Supple, no JVD, carotid bruits, or masses. Cardiac:  RRR, no murmurs, rubs, or gallops. No clubbing, cyanosis, edema.  Radials/DP/PT 2+ and equal bilaterally.  Respiratory:  Respirations regular and unlabored, clear to auscultation bilaterally. GI: Soft, nontender, nondistended, BS + x 4. MS: no deformity or atrophy. Skin: warm and dry, no rash. Neuro:  Strength and sensation are intact. Psych: Normal affect.  Accessory Clinical Findings    ECG personally reviewed by me today - *** - no acute changes.  Lab Results  Component Value Date   WBC 7.2 02/18/2021  HGB 15.5 02/18/2021   HCT 45.1 02/18/2021   MCV 96.6 02/18/2021   PLT 142 (L) 02/18/2021   Lab Results  Component Value Date   CREATININE 0.78 02/17/2021   BUN 21 02/17/2021   NA 138 02/17/2021   K 3.7 02/17/2021   CL 111 02/17/2021   CO2 20 (L) 02/17/2021   Lab Results  Component Value Date   ALT 24 02/17/2021   AST 24 02/17/2021   ALKPHOS 53 02/17/2021   BILITOT 3.2 (H) 02/17/2021   Lab Results  Component Value Date   CHOL 108 02/17/2021   HDL 52 02/17/2021   LDLCALC 26 02/17/2021   TRIG 149 02/17/2021   CHOLHDL 2.1 02/17/2021    Lab Results  Component Value Date   HGBA1C 6.8 (H) 02/17/2021    Assessment & Plan    1.  ***   Murray Hodgkins, NP 03/14/2021, 1:58 PM

## 2021-03-15 ENCOUNTER — Encounter: Payer: Self-pay | Admitting: Nurse Practitioner

## 2021-03-21 ENCOUNTER — Emergency Department: Payer: No Typology Code available for payment source

## 2021-03-21 ENCOUNTER — Emergency Department
Admission: EM | Admit: 2021-03-21 | Discharge: 2021-03-21 | Disposition: A | Discharge status: Home / Self Care | Payer: No Typology Code available for payment source | Attending: Emergency Medicine | Admitting: Emergency Medicine

## 2021-03-21 ENCOUNTER — Other Ambulatory Visit: Payer: Self-pay

## 2021-03-21 DIAGNOSIS — R0602 Shortness of breath: Secondary | ICD-10-CM | POA: Diagnosis not present

## 2021-03-21 DIAGNOSIS — Z5321 Procedure and treatment not carried out due to patient leaving prior to being seen by health care provider: Secondary | ICD-10-CM | POA: Diagnosis not present

## 2021-03-21 DIAGNOSIS — M79602 Pain in left arm: Secondary | ICD-10-CM | POA: Insufficient documentation

## 2021-03-21 DIAGNOSIS — M79601 Pain in right arm: Secondary | ICD-10-CM | POA: Insufficient documentation

## 2021-03-21 DIAGNOSIS — R0789 Other chest pain: Secondary | ICD-10-CM | POA: Insufficient documentation

## 2021-03-21 LAB — BASIC METABOLIC PANEL
Anion gap: 9 (ref 5–15)
BUN: 19 mg/dL (ref 8–23)
CO2: 20 mmol/L — ABNORMAL LOW (ref 22–32)
Calcium: 9.1 mg/dL (ref 8.9–10.3)
Chloride: 103 mmol/L (ref 98–111)
Creatinine, Ser: 1.12 mg/dL (ref 0.61–1.24)
GFR, Estimated: >60 mL/min (ref >60)
Glucose, Bld: 167 mg/dL — ABNORMAL HIGH (ref 70–99)
Potassium: 3.9 mmol/L (ref 3.5–5.1)
Sodium: 132 mmol/L — ABNORMAL LOW (ref 135–145)

## 2021-03-21 LAB — CBC
HCT: 43.4 % (ref 39.0–52.0)
Hemoglobin: 15.5 g/dL (ref 13.0–17.0)
MCH: 35.3 pg — ABNORMAL HIGH (ref 26.0–34.0)
MCHC: 35.7 g/dL (ref 30.0–36.0)
MCV: 98.9 fL (ref 80.0–100.0)
Platelets: 179 10*3/uL (ref 150–400)
RBC: 4.39 MIL/uL (ref 4.22–5.81)
RDW: 13.7 % (ref 11.5–15.5)
WBC: 8.2 10*3/uL (ref 4.0–10.5)
nRBC: 0 % (ref 0.0–0.2)

## 2021-03-21 LAB — TROPONIN I (HIGH SENSITIVITY): Troponin I (High Sensitivity): 3 ng/L (ref <18)

## 2021-03-21 IMAGING — CR DG CHEST 2V
1 series · 3 of 3 positions shown · non-contrast
Comparison: [DATE] chest radiograph and CTA

CLINICAL DATA: Chest pain/pressure radiating into the arms with
shortness of breath.

EXAM:
CHEST - 2 VIEW

[Series 1: dg chest 2 view · 0.14mm/px · 3 of 3 slices shown]
[im 1/3]
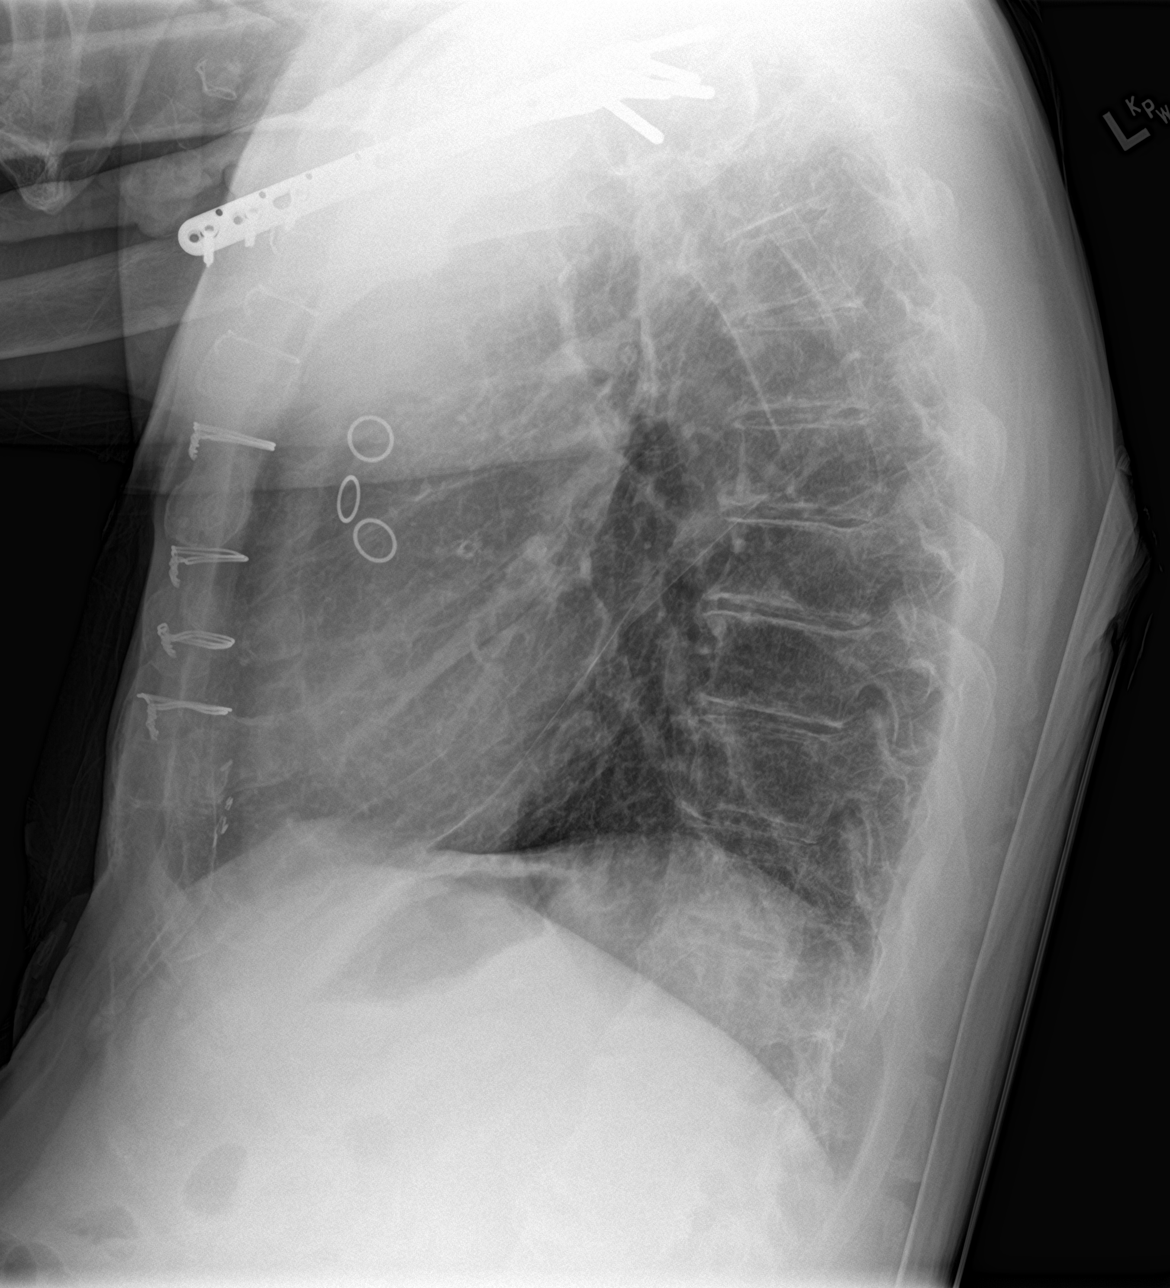
[im 2/3]
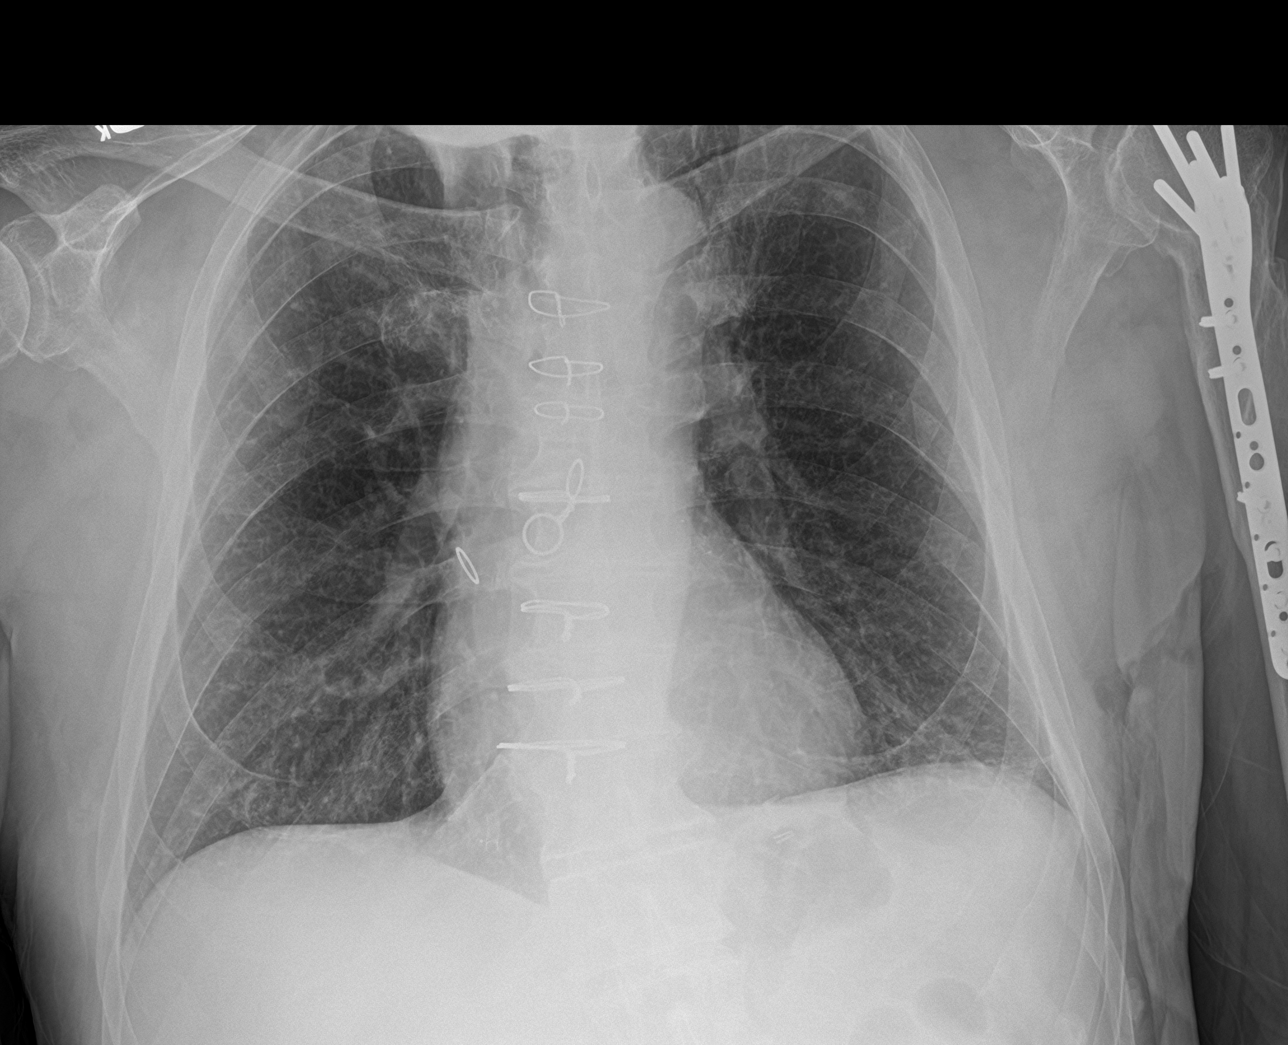
[im 3/3]
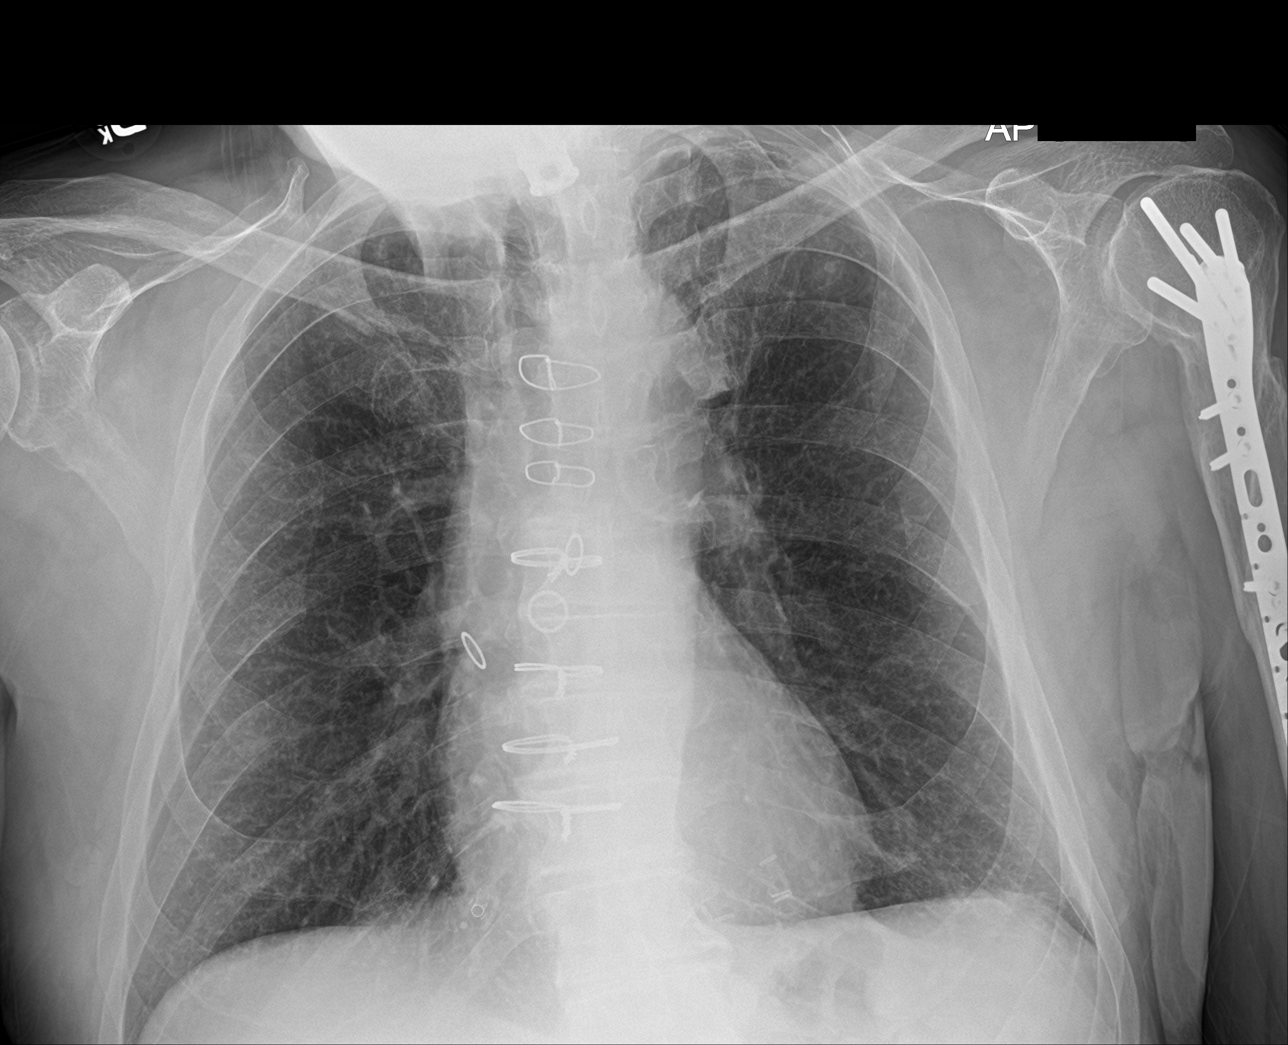

[3 of 3 positions shown; findings below may reference images not displayed]

FINDINGS: Sequelae of CABG are again identified. The cardiomediastinal
silhouette is unchanged with normal heart size. Chronic interstitial
coarsening is unchanged. No acute airspace consolidation, edema,
pleural effusion, pneumothorax is identified. Prior cervical spine
fusion and left humeral ORIF are noted.
IMPRESSION: No active cardiopulmonary disease.

## 2021-03-21 NOTE — ED Triage Notes (Signed)
Pt to ER via POV with complaints of centralized chest pain he describes as a heavy pressure, that radiates into both arms. Reports feeling short of breath. Symptoms started approx 1hour ago.   Extensive cardiac history including a quadruple bypass and multiple stent placements.

## 2021-04-10 ENCOUNTER — Encounter: Payer: Self-pay | Admitting: Emergency Medicine

## 2021-04-10 ENCOUNTER — Other Ambulatory Visit: Payer: Self-pay

## 2021-04-10 ENCOUNTER — Emergency Department
Admission: EM | Admit: 2021-04-10 | Discharge: 2021-04-10 | Disposition: A | Discharge status: Home / Self Care | Payer: No Typology Code available for payment source | Attending: Emergency Medicine | Admitting: Emergency Medicine

## 2021-04-10 DIAGNOSIS — Z5321 Procedure and treatment not carried out due to patient leaving prior to being seen by health care provider: Secondary | ICD-10-CM | POA: Diagnosis not present

## 2021-04-10 DIAGNOSIS — L299 Pruritus, unspecified: Secondary | ICD-10-CM | POA: Diagnosis not present

## 2021-04-10 DIAGNOSIS — T1490XA Injury, unspecified, initial encounter: Secondary | ICD-10-CM | POA: Insufficient documentation

## 2021-04-10 DIAGNOSIS — W57XXXA Bitten or stung by nonvenomous insect and other nonvenomous arthropods, initial encounter: Secondary | ICD-10-CM | POA: Insufficient documentation

## 2021-04-10 NOTE — ED Triage Notes (Signed)
Pt arrived via POV with c/o bed bug bites all over body, pt states it has been there for 2 months, unable to sleep because of the bites.   Pt c/o itching

## 2021-07-15 DIAGNOSIS — E1151 Type 2 diabetes mellitus with diabetic peripheral angiopathy without gangrene: Secondary | ICD-10-CM | POA: Diagnosis not present

## 2021-07-15 DIAGNOSIS — Z8509 Personal history of malignant neoplasm of other digestive organs: Secondary | ICD-10-CM | POA: Diagnosis not present

## 2021-07-15 DIAGNOSIS — I480 Paroxysmal atrial fibrillation: Secondary | ICD-10-CM | POA: Diagnosis not present

## 2021-07-15 DIAGNOSIS — I2510 Atherosclerotic heart disease of native coronary artery without angina pectoris: Secondary | ICD-10-CM | POA: Diagnosis not present

## 2021-07-15 DIAGNOSIS — R634 Abnormal weight loss: Secondary | ICD-10-CM | POA: Diagnosis not present

## 2021-07-15 DIAGNOSIS — I10 Essential (primary) hypertension: Secondary | ICD-10-CM | POA: Diagnosis not present

## 2021-07-15 DIAGNOSIS — N1831 Chronic kidney disease, stage 3a: Secondary | ICD-10-CM | POA: Diagnosis not present

## 2021-07-15 DIAGNOSIS — Z0000 Encounter for general adult medical examination without abnormal findings: Secondary | ICD-10-CM | POA: Diagnosis not present

## 2021-07-15 DIAGNOSIS — R296 Repeated falls: Secondary | ICD-10-CM | POA: Diagnosis not present

## 2021-07-15 DIAGNOSIS — E785 Hyperlipidemia, unspecified: Secondary | ICD-10-CM | POA: Diagnosis not present

## 2021-07-15 DIAGNOSIS — E1140 Type 2 diabetes mellitus with diabetic neuropathy, unspecified: Secondary | ICD-10-CM | POA: Diagnosis not present

## 2021-07-24 ENCOUNTER — Emergency Department: Payer: No Typology Code available for payment source

## 2021-07-24 ENCOUNTER — Inpatient Hospital Stay
Admission: EM | Admit: 2021-07-24 | Discharge: 2021-08-02 | DRG: 069 | Disposition: A | Discharge status: Skilled Nursing Facility | Payer: No Typology Code available for payment source | Attending: Internal Medicine | Admitting: Internal Medicine

## 2021-07-24 ENCOUNTER — Other Ambulatory Visit: Payer: Self-pay

## 2021-07-24 DIAGNOSIS — E785 Hyperlipidemia, unspecified: Secondary | ICD-10-CM | POA: Diagnosis present

## 2021-07-24 DIAGNOSIS — Z7901 Long term (current) use of anticoagulants: Secondary | ICD-10-CM

## 2021-07-24 DIAGNOSIS — F32A Depression, unspecified: Secondary | ICD-10-CM | POA: Diagnosis present

## 2021-07-24 DIAGNOSIS — R079 Chest pain, unspecified: Secondary | ICD-10-CM

## 2021-07-24 DIAGNOSIS — Z95828 Presence of other vascular implants and grafts: Secondary | ICD-10-CM

## 2021-07-24 DIAGNOSIS — R296 Repeated falls: Secondary | ICD-10-CM | POA: Diagnosis present

## 2021-07-24 DIAGNOSIS — Z8551 Personal history of malignant neoplasm of bladder: Secondary | ICD-10-CM

## 2021-07-24 DIAGNOSIS — Z951 Presence of aortocoronary bypass graft: Secondary | ICD-10-CM

## 2021-07-24 DIAGNOSIS — E1151 Type 2 diabetes mellitus with diabetic peripheral angiopathy without gangrene: Secondary | ICD-10-CM | POA: Diagnosis present

## 2021-07-24 DIAGNOSIS — I110 Hypertensive heart disease with heart failure: Secondary | ICD-10-CM | POA: Diagnosis present

## 2021-07-24 DIAGNOSIS — I480 Paroxysmal atrial fibrillation: Secondary | ICD-10-CM | POA: Diagnosis present

## 2021-07-24 DIAGNOSIS — R531 Weakness: Secondary | ICD-10-CM

## 2021-07-24 DIAGNOSIS — I10 Essential (primary) hypertension: Secondary | ICD-10-CM | POA: Diagnosis present

## 2021-07-24 DIAGNOSIS — I200 Unstable angina: Secondary | ICD-10-CM

## 2021-07-24 DIAGNOSIS — E871 Hypo-osmolality and hyponatremia: Secondary | ICD-10-CM | POA: Diagnosis present

## 2021-07-24 DIAGNOSIS — E1165 Type 2 diabetes mellitus with hyperglycemia: Secondary | ICD-10-CM | POA: Diagnosis present

## 2021-07-24 DIAGNOSIS — G894 Chronic pain syndrome: Secondary | ICD-10-CM

## 2021-07-24 DIAGNOSIS — R2990 Unspecified symptoms and signs involving the nervous system: Secondary | ICD-10-CM | POA: Diagnosis not present

## 2021-07-24 DIAGNOSIS — Z87891 Personal history of nicotine dependence: Secondary | ICD-10-CM

## 2021-07-24 DIAGNOSIS — I2510 Atherosclerotic heart disease of native coronary artery without angina pectoris: Secondary | ICD-10-CM | POA: Diagnosis present

## 2021-07-24 DIAGNOSIS — R200 Anesthesia of skin: Secondary | ICD-10-CM

## 2021-07-24 DIAGNOSIS — M6281 Muscle weakness (generalized): Secondary | ICD-10-CM | POA: Diagnosis present

## 2021-07-24 DIAGNOSIS — E1143 Type 2 diabetes mellitus with diabetic autonomic (poly)neuropathy: Secondary | ICD-10-CM | POA: Diagnosis present

## 2021-07-24 DIAGNOSIS — R071 Chest pain on breathing: Secondary | ICD-10-CM | POA: Diagnosis present

## 2021-07-24 DIAGNOSIS — K219 Gastro-esophageal reflux disease without esophagitis: Secondary | ICD-10-CM | POA: Diagnosis present

## 2021-07-24 DIAGNOSIS — N179 Acute kidney failure, unspecified: Secondary | ICD-10-CM | POA: Diagnosis present

## 2021-07-24 DIAGNOSIS — Z8249 Family history of ischemic heart disease and other diseases of the circulatory system: Secondary | ICD-10-CM

## 2021-07-24 DIAGNOSIS — Z955 Presence of coronary angioplasty implant and graft: Secondary | ICD-10-CM

## 2021-07-24 DIAGNOSIS — Z888 Allergy status to other drugs, medicaments and biological substances status: Secondary | ICD-10-CM

## 2021-07-24 DIAGNOSIS — R911 Solitary pulmonary nodule: Secondary | ICD-10-CM | POA: Diagnosis present

## 2021-07-24 DIAGNOSIS — G8194 Hemiplegia, unspecified affecting left nondominant side: Secondary | ICD-10-CM | POA: Diagnosis present

## 2021-07-24 DIAGNOSIS — Z79899 Other long term (current) drug therapy: Secondary | ICD-10-CM

## 2021-07-24 DIAGNOSIS — Z886 Allergy status to analgesic agent status: Secondary | ICD-10-CM

## 2021-07-24 DIAGNOSIS — I5032 Chronic diastolic (congestive) heart failure: Secondary | ICD-10-CM | POA: Diagnosis present

## 2021-07-24 DIAGNOSIS — Z7984 Long term (current) use of oral hypoglycemic drugs: Secondary | ICD-10-CM

## 2021-07-24 DIAGNOSIS — R4781 Slurred speech: Secondary | ICD-10-CM

## 2021-07-24 DIAGNOSIS — Z20822 Contact with and (suspected) exposure to covid-19: Secondary | ICD-10-CM | POA: Diagnosis present

## 2021-07-24 DIAGNOSIS — I7121 Aneurysm of the ascending aorta, without rupture: Secondary | ICD-10-CM | POA: Diagnosis present

## 2021-07-24 DIAGNOSIS — R29810 Facial weakness: Secondary | ICD-10-CM

## 2021-07-24 DIAGNOSIS — I6529 Occlusion and stenosis of unspecified carotid artery: Secondary | ICD-10-CM | POA: Diagnosis present

## 2021-07-24 DIAGNOSIS — Z823 Family history of stroke: Secondary | ICD-10-CM

## 2021-07-24 DIAGNOSIS — G459 Transient cerebral ischemic attack, unspecified: Principal | ICD-10-CM | POA: Diagnosis present

## 2021-07-24 DIAGNOSIS — K5900 Constipation, unspecified: Secondary | ICD-10-CM | POA: Diagnosis present

## 2021-07-24 DIAGNOSIS — Z8673 Personal history of transient ischemic attack (TIA), and cerebral infarction without residual deficits: Secondary | ICD-10-CM

## 2021-07-24 DIAGNOSIS — Z7902 Long term (current) use of antithrombotics/antiplatelets: Secondary | ICD-10-CM

## 2021-07-24 LAB — CBC
HCT: 44.1 % (ref 39.0–52.0)
Hemoglobin: 15 g/dL (ref 13.0–17.0)
MCH: 32.8 pg (ref 26.0–34.0)
MCHC: 34 g/dL (ref 30.0–36.0)
MCV: 96.5 fL (ref 80.0–100.0)
Platelets: 160 10*3/uL (ref 150–400)
RBC: 4.57 MIL/uL (ref 4.22–5.81)
RDW: 12.8 % (ref 11.5–15.5)
WBC: 7.2 10*3/uL (ref 4.0–10.5)
nRBC: 0 % (ref 0.0–0.2)

## 2021-07-24 LAB — DIFFERENTIAL
Abs Immature Granulocytes: 0.01 10*3/uL (ref 0.00–0.07)
Basophils Absolute: 0.1 10*3/uL (ref 0.0–0.1)
Basophils Relative: 1 %
Eosinophils Absolute: 0.3 10*3/uL (ref 0.0–0.5)
Eosinophils Relative: 4 %
Immature Granulocytes: 0 %
Lymphocytes Relative: 33 %
Lymphs Abs: 2.3 10*3/uL (ref 0.7–4.0)
Monocytes Absolute: 0.6 10*3/uL (ref 0.1–1.0)
Monocytes Relative: 9 %
Neutro Abs: 3.8 10*3/uL (ref 1.7–7.7)
Neutrophils Relative %: 53 %

## 2021-07-24 LAB — URINALYSIS, ROUTINE W REFLEX MICROSCOPIC
Bacteria, UA: NONE SEEN
Bilirubin Urine: NEGATIVE
Glucose, UA: >=500 mg/dL — AB
Hgb urine dipstick: NEGATIVE
Ketones, ur: NEGATIVE mg/dL
Leukocytes,Ua: NEGATIVE
Nitrite: NEGATIVE
Protein, ur: NEGATIVE mg/dL
Specific Gravity, Urine: >1.046 — ABNORMAL HIGH (ref 1.005–1.030)
Squamous Epithelial / HPF: NONE SEEN (ref 0–5)
pH: 6 (ref 5.0–8.0)

## 2021-07-24 LAB — COMPREHENSIVE METABOLIC PANEL
ALT: 21 U/L (ref 0–44)
AST: 18 U/L (ref 15–41)
Albumin: 3.4 g/dL — ABNORMAL LOW (ref 3.5–5.0)
Alkaline Phosphatase: 77 U/L (ref 38–126)
Anion gap: 7 (ref 5–15)
BUN: 20 mg/dL (ref 8–23)
CO2: 23 mmol/L (ref 22–32)
Calcium: 8.8 mg/dL — ABNORMAL LOW (ref 8.9–10.3)
Chloride: 101 mmol/L (ref 98–111)
Creatinine, Ser: 1.26 mg/dL — ABNORMAL HIGH (ref 0.61–1.24)
GFR, Estimated: 59 mL/min — ABNORMAL LOW (ref >60)
Glucose, Bld: 150 mg/dL — ABNORMAL HIGH (ref 70–99)
Potassium: 3.6 mmol/L (ref 3.5–5.1)
Sodium: 131 mmol/L — ABNORMAL LOW (ref 135–145)
Total Bilirubin: 1.2 mg/dL (ref 0.3–1.2)
Total Protein: 6.4 g/dL — ABNORMAL LOW (ref 6.5–8.1)

## 2021-07-24 LAB — PROTIME-INR
INR: 1.1 (ref 0.8–1.2)
Prothrombin Time: 14.2 seconds (ref 11.4–15.2)

## 2021-07-24 LAB — RESP PANEL BY RT-PCR (FLU A&B, COVID) ARPGX2
Influenza A by PCR: NEGATIVE
Influenza B by PCR: NEGATIVE
SARS Coronavirus 2 by RT PCR: NEGATIVE

## 2021-07-24 LAB — URINE DRUG SCREEN, QUALITATIVE (ARMC ONLY)
Amphetamines, Ur Screen: NOT DETECTED
Barbiturates, Ur Screen: NOT DETECTED
Benzodiazepine, Ur Scrn: NOT DETECTED
Cannabinoid 50 Ng, Ur ~~LOC~~: NOT DETECTED
Cocaine Metabolite,Ur ~~LOC~~: NOT DETECTED
MDMA (Ecstasy)Ur Screen: NOT DETECTED
Methadone Scn, Ur: NOT DETECTED
Opiate, Ur Screen: NOT DETECTED
Phencyclidine (PCP) Ur S: NOT DETECTED
Tricyclic, Ur Screen: NOT DETECTED

## 2021-07-24 LAB — TROPONIN I (HIGH SENSITIVITY)
Troponin I (High Sensitivity): 8 ng/L (ref <18)
Troponin I (High Sensitivity): 9 ng/L (ref <18)

## 2021-07-24 LAB — CBG MONITORING, ED: Glucose-Capillary: 181 mg/dL — ABNORMAL HIGH (ref 70–99)

## 2021-07-24 LAB — ETHANOL: Alcohol, Ethyl (B): <10 mg/dL (ref <10)

## 2021-07-24 LAB — APTT: aPTT: 33 seconds (ref 24–36)

## 2021-07-24 IMAGING — CT CT ANGIO CHEST-ABD-PELV FOR DISSECTION W/ AND WO/W CM
1 of 14 series · 10 of 37 positions shown, 16 images · IV contrast (APPLIED)
Comparison: [DATE]

CLINICAL DATA: Mid chest pain radiating down both legs

EXAM:
CT ANGIOGRAPHY CHEST, ABDOMEN AND PELVIS
TECHNIQUE: Non-contrast CT of the chest was initially obtained.

[Series 20: arterial thins · axial · arterial · 0.83mm/px · z∈[-861,-270]mm · 10 of 904 slices shown, 16 images]
[im 83/904  mediastinal]
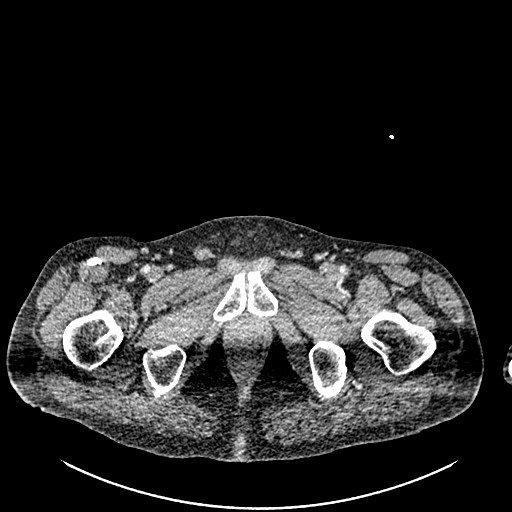
[im 83/904  bone]
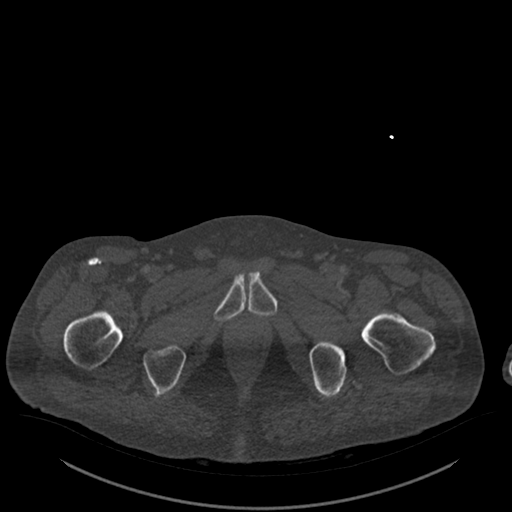
[im 165/904  mediastinal]
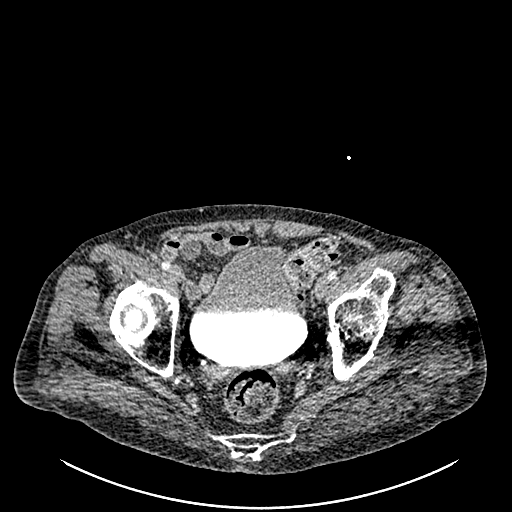
[im 247/904  mediastinal]
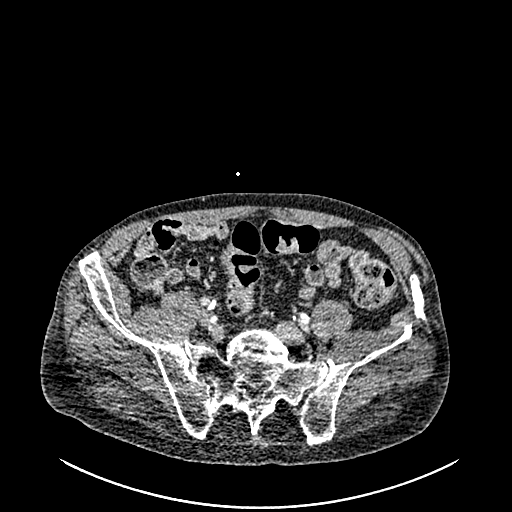
[im 329/904  mediastinal]
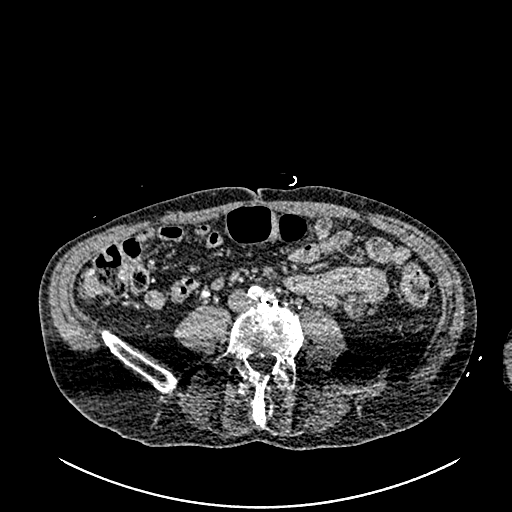
[im 411/904  mediastinal]
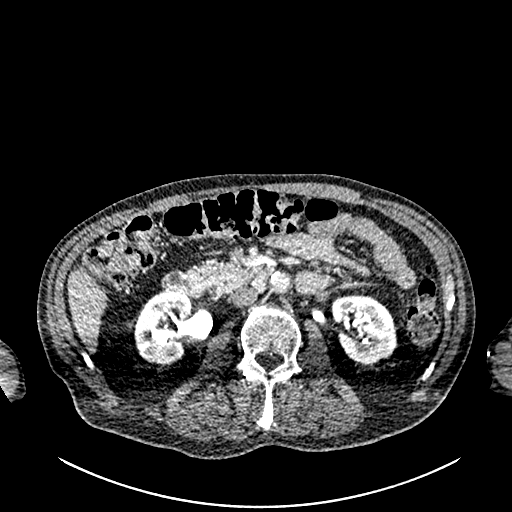
[im 493/904  mediastinal]
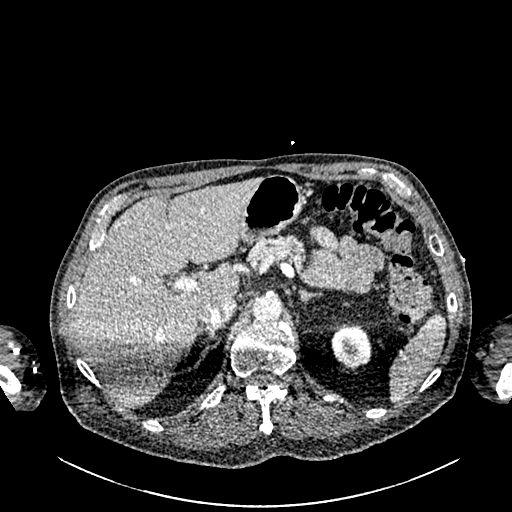
[im 575/904  mediastinal]
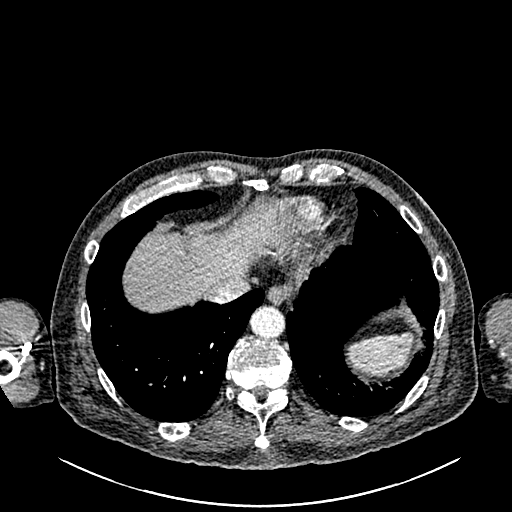
[im 575/904  lung]
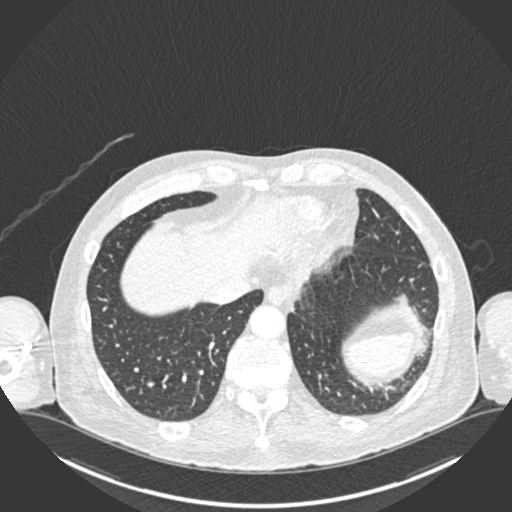
[im 657/904  mediastinal]
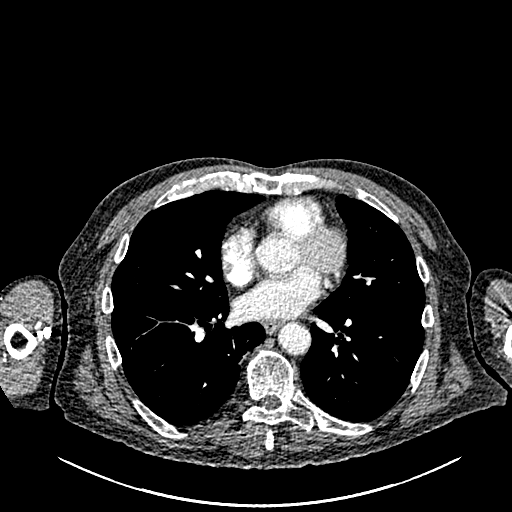
[im 657/904  lung]
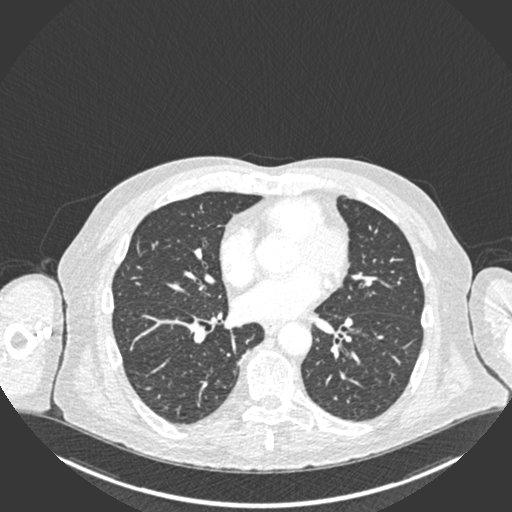
[im 739/904  mediastinal]
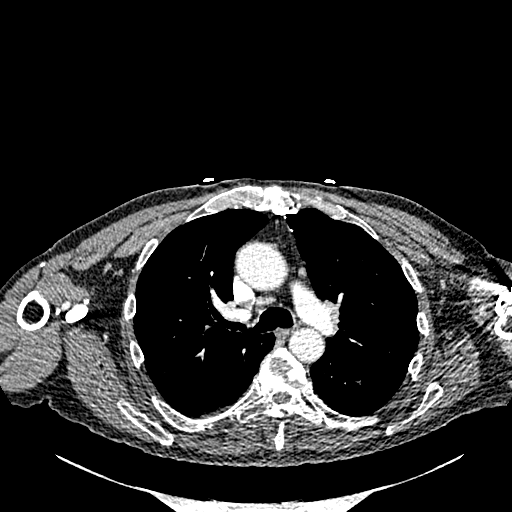
[im 739/904  lung]
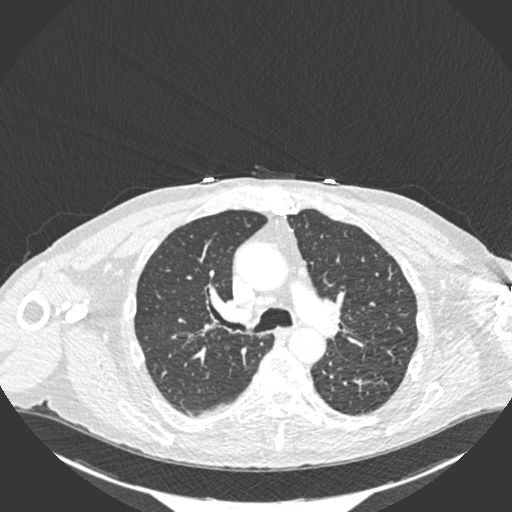
[im 739/904  bone]
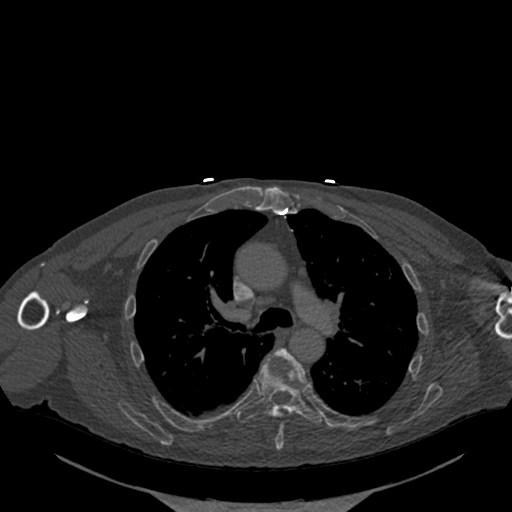
[im 821/904  mediastinal]
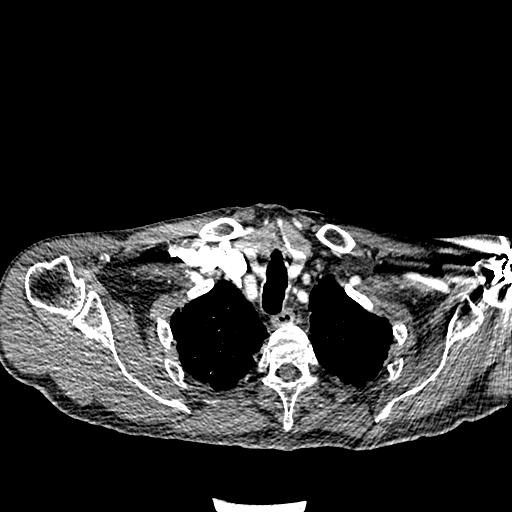
[im 821/904  lung]
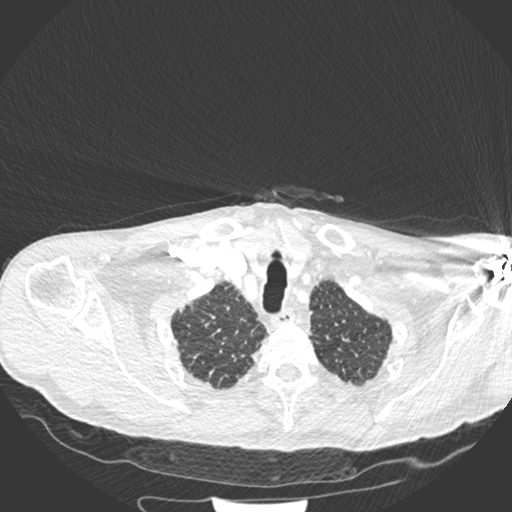

[10 of 37 positions shown; findings below may reference images not displayed]

Multidetector CT imaging through the chest, abdomen and pelvis was
performed using the standard protocol during bolus administration of
intravenous contrast. Multiplanar reconstructed images and MIPs were
obtained and reviewed to evaluate the vascular anatomy.

RADIATION DOSE REDUCTION: This exam was performed according to the
departmental dose-optimization program which includes automated
exposure control, adjustment of the mA and/or kV according to
patient size and/or use of iterative reconstruction technique.

CONTRAST:  75mL OMNIPAQUE IOHEXOL 350 MG/ML SOLN
FINDINGS: CTA CHEST FINDINGS

Cardiovascular: 4 cm ascending thoracic aortic aneurysm. No evidence
of dissection. Mild atherosclerosis of the aortic arch.

The heart is unremarkable without pericardial effusion. Postsurgical
changes are seen from prior CABG. While not optimized for
opacification of the pulmonary vasculature, there is sufficient
contrast enhancement to exclude central pulmonary emboli.

Mediastinum/Nodes: No enlarged mediastinal, hilar, or axillary lymph
nodes. Thyroid gland, trachea, and esophagus demonstrate no
significant findings.

Lungs/Pleura: No acute airspace disease, effusion, or pneumothorax.
Central airways are patent.

Musculoskeletal: No acute or destructive bony lesions. Reconstructed
images demonstrate no additional findings.

Review of the MIP images confirms the above findings.

CTA ABDOMEN AND PELVIS FINDINGS

VASCULAR

Aorta: Normal caliber aorta without aneurysm, dissection, vasculitis
or significant stenosis. Scattered atherosclerosis.

Celiac: Patent without evidence of aneurysm, dissection, vasculitis
or significant stenosis. Congenital variant with direct origin of
the common hepatic artery from the aorta. Atherosclerosis at the
origin of the common hepatic and celiac arteries without flow
limiting stenosis.

SMA: Patent without evidence of aneurysm, dissection, vasculitis or
significant stenosis. Atherosclerosis at the origin of the SMA
without flow limiting stenosis.

Renals: Both renal arteries are patent without evidence of aneurysm,
dissection, vasculitis, fibromuscular dysplasia or significant
stenosis. Stable mild atherosclerosis.

IMA: Patent without evidence of aneurysm, dissection, vasculitis or
significant stenosis.

Inflow: Patent right common iliac artery stent unchanged. Stable
atherosclerosis without flow limiting stenosis, aneurysm, or
dissection.

Veins: No obvious venous abnormality within the limitations of this
arterial phase study.

Review of the MIP images confirms the above findings.

NON-VASCULAR

Hepatobiliary: No focal liver abnormality is seen. No gallstones,
gallbladder wall thickening, or biliary dilatation.

Pancreas: Incidental pancreatic divisum. Normal enhancement of the
pancreatic parenchyma. No inflammatory changes or pancreatic duct
dilation.

Spleen: Normal in size without focal abnormality.

Adrenals/Urinary Tract: Adrenal glands are unremarkable. Kidneys are
normal, without renal calculi, focal lesion, or hydronephrosis.
Bladder is unremarkable.

Stomach/Bowel: No bowel obstruction or ileus. Normal appendix right
lower quadrant. No bowel wall thickening or inflammatory change.

Lymphatic: No pathologic adenopathy within the abdomen or pelvis.

Reproductive: Prostate is unremarkable.

Other: No free fluid or free intraperitoneal gas. No abdominal wall
hernia.

Musculoskeletal: No acute or destructive bony lesions. Reconstructed
images demonstrate no additional findings.

Review of the MIP images confirms the above findings.
IMPRESSION: 1. 4 cm ascending thoracic aortic aneurysm. Recommend annual imaging
followup by CTA or MRA. This recommendation follows [JL]
ACCF/AHA/AATS/ACR/ASA/SCA/PURNA/PURNA/PURNA/PURNA Guidelines for the
Diagnosis and Management of Patients with Thoracic Aortic Disease.
Circulation. [JL]; 121: [id number]. Aortic aneurysm NOS ([JL]-[JL])
2. No evidence of aortic dissection.
3. No acute intrathoracic, intra-abdominal, or intrapelvic process.
4.  Aortic Atherosclerosis ([JL]-[JL]).

## 2021-07-24 IMAGING — CT CT HEAD CODE STROKE
4 series · 16 of 47 positions shown, 18 images · non-contrast
Comparison: CT head [DATE]

CLINICAL DATA: Code stroke.  Acute neuro deficit.



[Series 3: head bone · axial · 0.44mm/px · z∈[-120,-88]mm · 3 of 80 slices shown]
[im 8/80  bone]
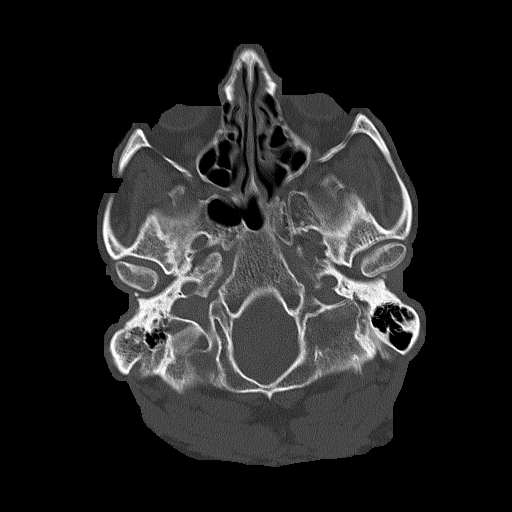
[im 16/80  bone]
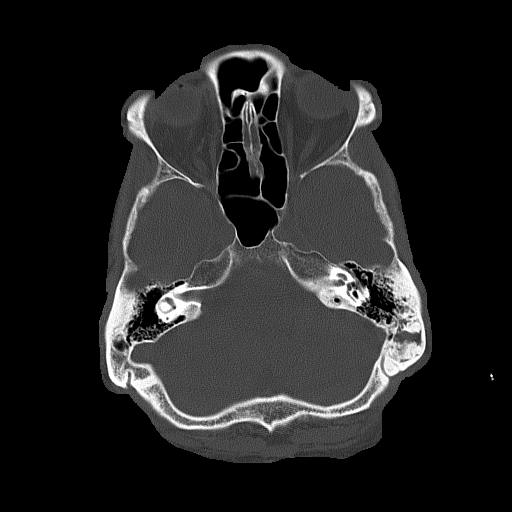
[im 24/80  bone]
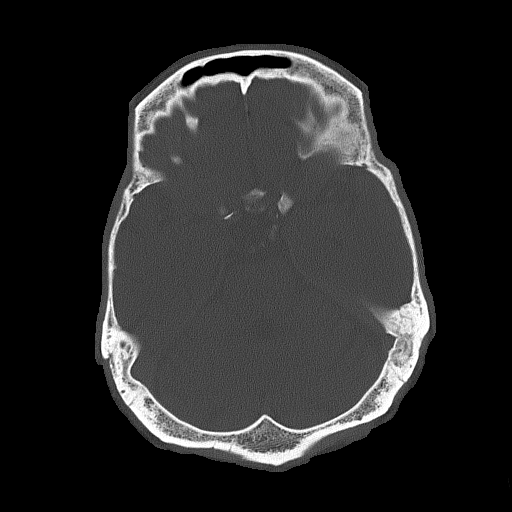

[Series 4: head wo · axial · 0.44mm/px · z∈[-119,+1]mm · 7 of 32 slices shown, 9 images]
[im 4/32  brain]
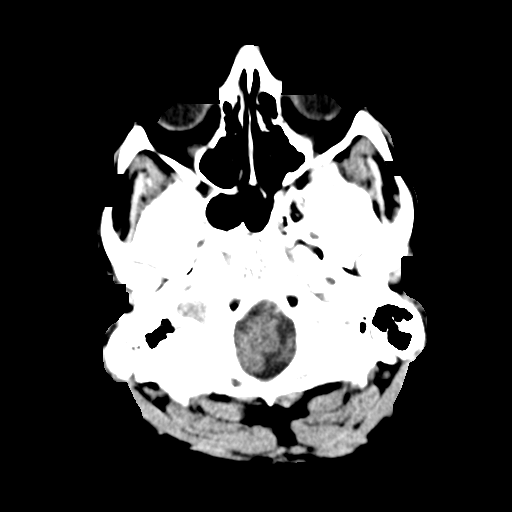
[im 4/32  bone]
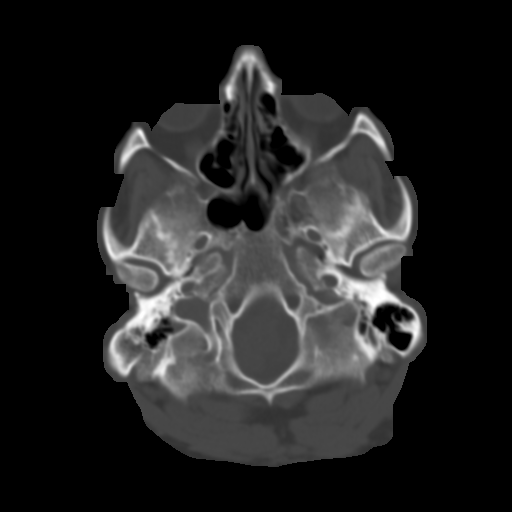
[im 8/32  brain]
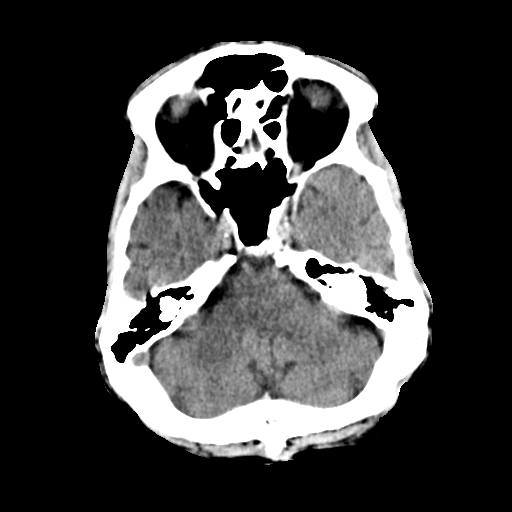
[im 12/32  brain]
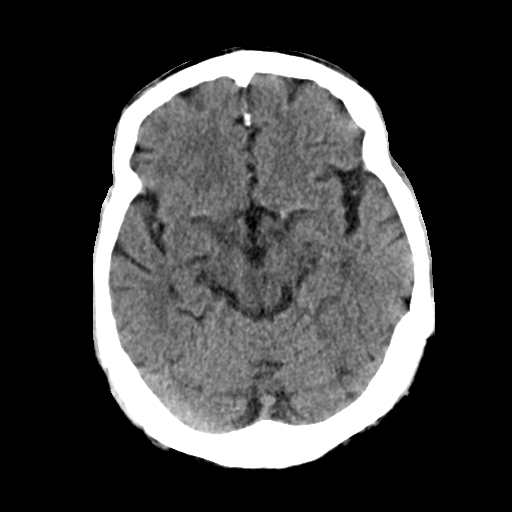
[im 16/32  brain]
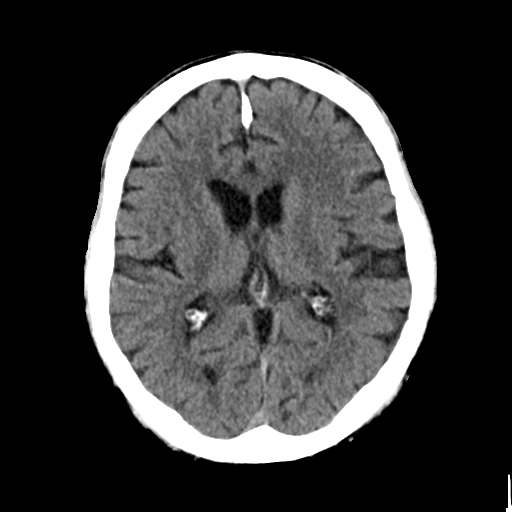
[im 20/32  brain]
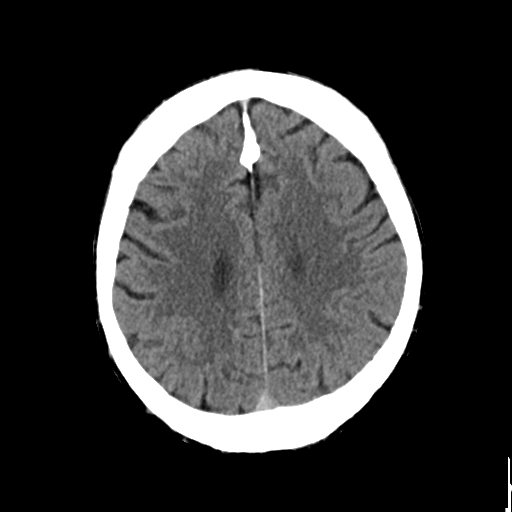
[im 20/32  bone]
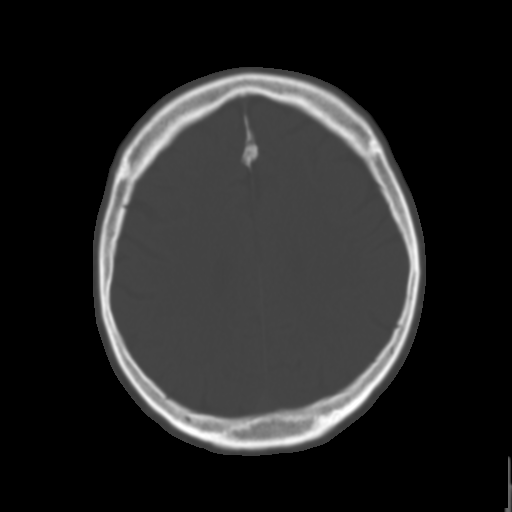
[im 24/32  brain]
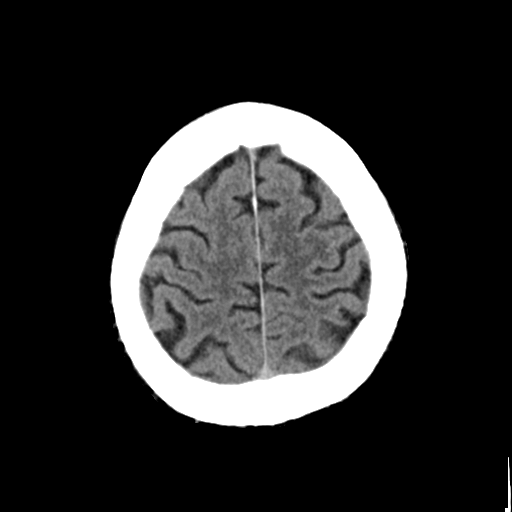
[im 28/32  brain]
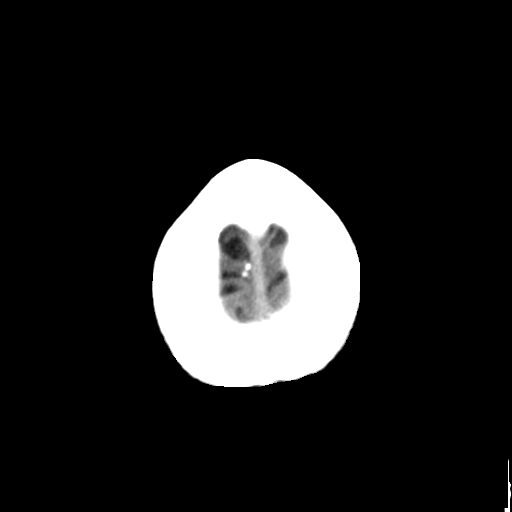

[Series 5: coronal soft tissue · coronal · 0.32mm/px · 3 of 68 slices shown]
[im 23/68  brain]
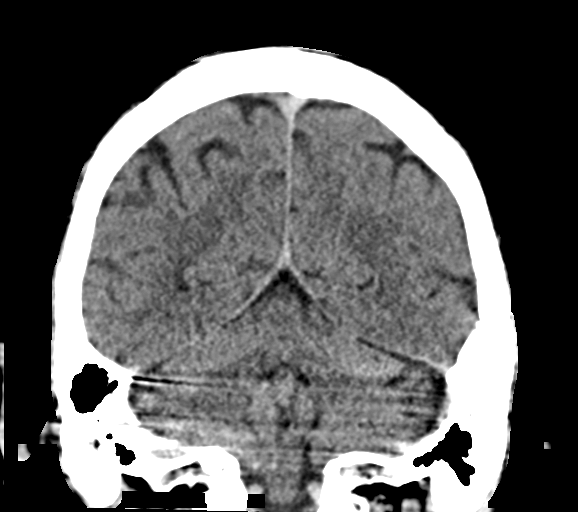
[im 30/68  brain]
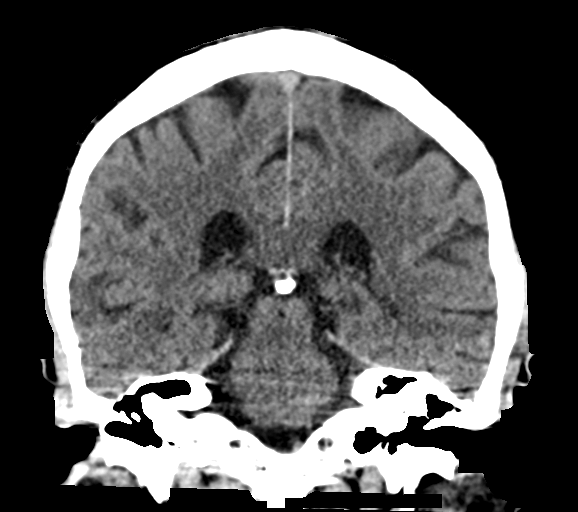
[im 38/68  brain]
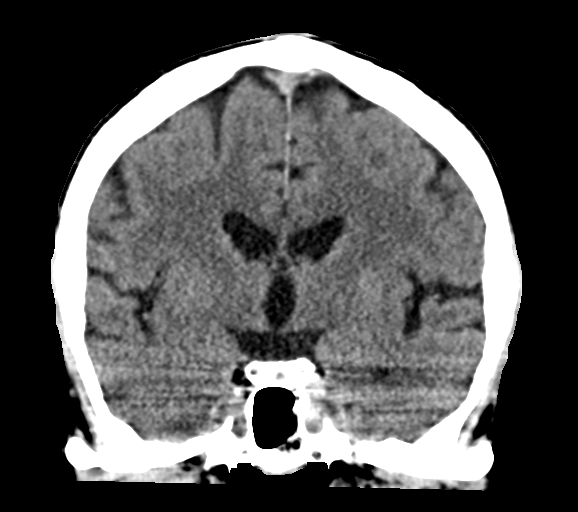

[Series 6: sagittal soft tissue · sagittal · 0.32mm/px · 3 of 58 slices shown]
[im 20/58  brain]
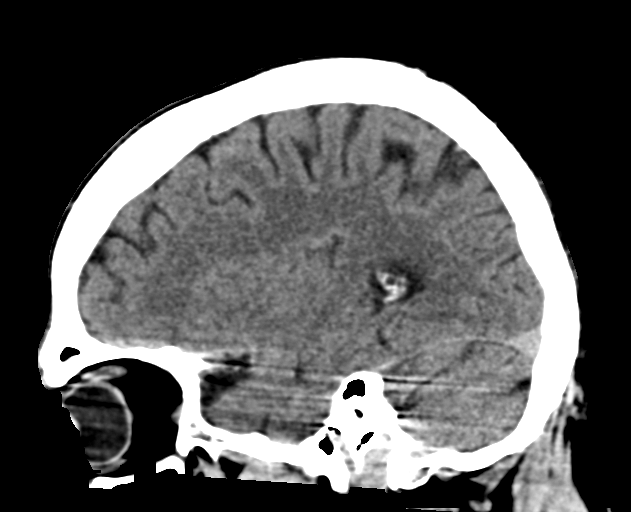
[im 29/58  brain]
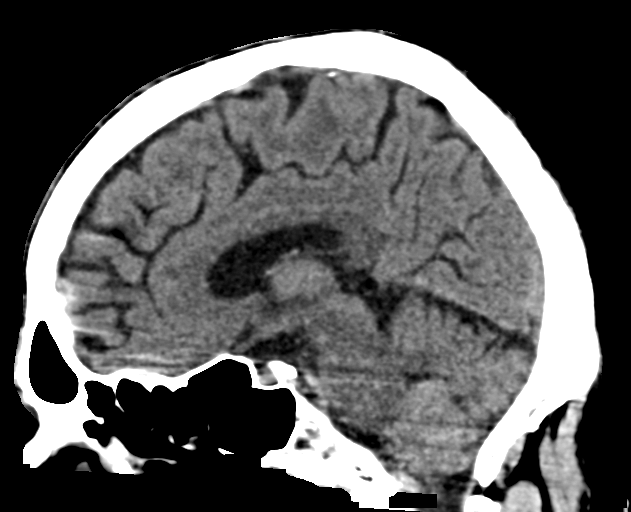
[im 39/58  brain]
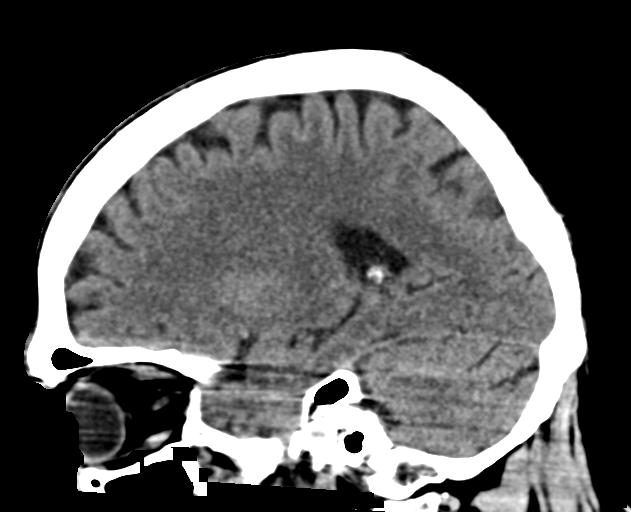

[16 of 47 positions shown; findings below may reference images not displayed]

FINDINGS: Brain: No evidence of acute infarction, hemorrhage, hydrocephalus,
extra-axial collection or mass lesion/mass effect.

Vascular: Symmetric hyperintensity in the middle cerebral arteries
bilaterally and in the basilar. No focal vascular hyperdensity.

Skull: Negative

Sinuses/Orbits: Mild mucosal edema paranasal sinuses. Negative orbit

Other: None

ASPECTS ([place] Stroke Program Early CT Score)

- Ganglionic level infarction (caudate, lentiform nuclei, internal
capsule, insula, M1-M3 cortex): 7

- Supraganglionic infarction (M4-M6 cortex): 3

Total score (0-10 with 10 being normal): 10
IMPRESSION: 1. No acute intracranial abnormality.
2. ASPECTS is 10
pm to provider [facility] via text page

## 2021-07-24 MED ORDER — ACETAMINOPHEN 325 MG PO TABS
650.0000 mg | ORAL_TABLET | Freq: Once | ORAL | Status: DC
  Filled 2021-07-24: qty 2

## 2021-07-24 MED ORDER — IOHEXOL 350 MG/ML SOLN
75.0000 mL | Freq: Once | INTRAVENOUS | Status: AC | PRN
  Administered 2021-07-24: 75 mL via INTRAVENOUS

## 2021-07-24 NOTE — ED Notes (Signed)
Pt roomed at this time after a CT scan.

## 2021-07-24 NOTE — Progress Notes (Signed)
°   07/24/21 2010  Clinical Encounter Type  Visited With Patient not available  Referral From Other (Comment) (code stroke)  Spiritual Encounters  Spiritual Needs Other (Comment) (not assessed)   Chaplain Burris attempted to offer support to Pt following code stroke but they remain in CT for an extended period. Will continue to remain on unit and will f/u upon Pt's return.

## 2021-07-24 NOTE — ED Provider Notes (Signed)
[facility] Provider Note    Event Date/Time   First MD Initiated Contact with Patient 07/24/21 1938     (approximate)  History   Code Stroke (Pt from home c/o L sided weakness with LKW 4 pm per EMS but pt states first symptom of chest pain onset around noon. Pt has hx of 2 previous strokes with no deficits and is on eliquis. )  HPI Bryan Scott is a 77 y.o. male who presents via EMS as a stroke alert called prior to arrival complaining of left-sided facial droop and left upper extremity weakness.  Neurology at bedside via teleneurology cart on patient arrival and was evaluated prior to CT  Upon further evaluation, patient does have history of of CAD, A-fib on Eliquis with last dose this morning, hypertension, diabetes, hyperlipidemia, and previous strokes who presents for sudden onset of slurred speech, confusion, and left-sided deficits that began at approximately 4 PM today.  Patient also endorses associated chest pain     Physical Exam   Triage Vital Signs: ED Triage Vitals  Enc Vitals Group     BP      Pulse      Resp      Temp      Temp src      SpO2      Weight      Height      Head Circumference      Peak Flow      Pain Score      Pain Loc      Pain Edu?      Excl. in [place]?     Most recent vital signs: Vitals:   07/24/21 2139 07/24/21 2200  BP: (!) 139/99 (!) 160/80  Pulse: 67 69  Resp: 17 19  Temp:    SpO2: 98% 96%    General: Awake, no distress.  CV:  Good peripheral perfusion.  Resp:  Normal effort.  Abd:  No distention.  Other:  NIH stroke scale 8   ED Results / Procedures / Treatments   Labs (all labs ordered are listed, but only abnormal results are displayed) Labs Reviewed  COMPREHENSIVE METABOLIC PANEL - Abnormal; Notable for the following components:      Result Value   Sodium 131 (*)    Glucose, Bld 150 (*)    Creatinine, Ser 1.26 (*)    Calcium 8.8 (*)    Total Protein 6.4 (*)    Albumin 3.4 (*)    GFR,  Estimated 59 (*)    All other components within normal limits  URINALYSIS, ROUTINE W REFLEX MICROSCOPIC - Abnormal; Notable for the following components:   Color, Urine STRAW (*)    APPearance CLEAR (*)    Specific Gravity, Urine >1.046 (*)    Glucose, UA >=500 (*)    All other components within normal limits  CBG MONITORING, ED - Abnormal; Notable for the following components:   Glucose-Capillary 181 (*)    All other components within normal limits  RESP PANEL BY RT-PCR (FLU A&B, COVID) ARPGX2  ETHANOL  PROTIME-INR  APTT  CBC  DIFFERENTIAL  URINE DRUG SCREEN, QUALITATIVE ([facility] ONLY)  TROPONIN I (HIGH SENSITIVITY)  TROPONIN I (HIGH SENSITIVITY)   RADIOLOGY ED MD interpretation: CT angio of the chest abdomen and pelvis shows a 4 cm ascending thoracic aortic aneurysm as well as no other acute intrathoracic, intra-abdominal, or intrapelvic process  CT of the head without contrast shows no evidence of acute abnormalities  including no intracerebral hemorrhage, obvious masses, or significant edema  Agree with radiology assessment  Official radiology report(s): CT Angio Chest/Abd/Pel for Dissection W and/or W/WO  Result Date: 07/24/2021 CLINICAL DATA:  Mid chest pain radiating down both legs EXAM: CT ANGIOGRAPHY CHEST, ABDOMEN AND PELVIS TECHNIQUE: Non-contrast CT of the chest was initially obtained. Multidetector CT imaging through the chest, abdomen and pelvis was performed using the standard protocol during bolus administration of intravenous contrast. Multiplanar reconstructed images and MIPs were obtained and reviewed to evaluate the vascular anatomy. RADIATION DOSE REDUCTION: This exam was performed according to the departmental dose-optimization program which includes automated exposure control, adjustment of the mA and/or kV according to patient size and/or use of iterative reconstruction technique. CONTRAST:  51mL OMNIPAQUE IOHEXOL 350 MG/ML SOLN COMPARISON:  02/17/2021 FINDINGS: CTA  CHEST FINDINGS Cardiovascular: 4 cm ascending thoracic aortic aneurysm. No evidence of dissection. Mild atherosclerosis of the aortic arch. The heart is unremarkable without pericardial effusion. 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 While not optimized for opacification of the pulmonary vasculature, there is sufficient contrast enhancement to exclude central pulmonary emboli. Mediastinum/Nodes: No enlarged mediastinal, hilar, or axillary lymph nodes. Thyroid gland, trachea, and esophagus demonstrate no significant findings. Lungs/Pleura: No acute airspace disease, effusion, or pneumothorax. Central airways are patent. Musculoskeletal: No acute or destructive bony lesions. Reconstructed images demonstrate no additional findings. Review of the MIP images confirms the above findings. CTA ABDOMEN AND PELVIS FINDINGS VASCULAR Aorta: Normal caliber aorta without aneurysm, dissection, vasculitis or significant stenosis. Scattered atherosclerosis. Celiac: Patent without evidence of aneurysm, dissection, vasculitis or significant stenosis. 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 Atherosclerosis at the origin of the common hepatic and celiac arteries without flow limiting stenosis. SMA: Patent without evidence of aneurysm, dissection, vasculitis or significant stenosis. Atherosclerosis at the origin of the SMA without flow limiting stenosis. Renals: Both renal arteries are patent without evidence of aneurysm, dissection, vasculitis, fibromuscular dysplasia or significant stenosis. Stable mild atherosclerosis. IMA: Patent without evidence of aneurysm, dissection, vasculitis or significant stenosis. Inflow: Patent right common iliac artery stent unchanged. Stable atherosclerosis without flow limiting stenosis, aneurysm, or dissection. Veins: No obvious venous abnormality within the limitations of this arterial phase study. Review of the MIP images confirms the above findings.  NON-VASCULAR Hepatobiliary: No focal liver abnormality is seen. No gallstones, gallbladder wall thickening, or biliary dilatation. Pancreas: Incidental pancreatic divisum. Normal enhancement of the pancreatic parenchyma. No inflammatory changes or pancreatic duct dilation. Spleen: Normal in size without focal abnormality. Adrenals/Urinary Tract: Adrenal glands are unremarkable. Kidneys are normal, without renal calculi, focal lesion, or hydronephrosis. Bladder is unremarkable. Stomach/Bowel: No bowel obstruction or ileus. Normal appendix right lower quadrant. No bowel wall thickening or inflammatory change. Lymphatic: No pathologic adenopathy within the abdomen or pelvis. Reproductive: Prostate is unremarkable. Other: No free fluid or free intraperitoneal gas. No abdominal wall hernia. Musculoskeletal: No acute or destructive bony lesions. Reconstructed images demonstrate no additional findings. Review of the MIP images confirms the above findings. IMPRESSION: 1. 4 cm ascending thoracic aortic aneurysm. Recommend annual imaging followup by CTA or MRA. This recommendation follows 2010 ACCF/AHA/AATS/ACR/ASA/SCA/SCAI/SIR/STS/SVM Guidelines for the Diagnosis and Management of Patients with Thoracic Aortic Disease. Circulation. 2010; 121: D408-X448. Aortic aneurysm NOS (ICD10-I71.9) 2. No evidence of aortic dissection. 3. No acute intrathoracic, intra-abdominal, or intrapelvic process. 4.  Aortic Atherosclerosis (ICD10-I70.0). Electronically Signed   By: Randa Ngo M.D.   On: 07/24/2021 21:04   CT HEAD CODE STROKE WO CONTRAST  Result Date: 07/24/2021 CLINICAL DATA:  Code stroke.  Acute neuro deficit. EXAM: CT HEAD WITHOUT CONTRAST TECHNIQUE: Contiguous axial images were obtained from the base of the skull through the vertex without intravenous contrast. RADIATION DOSE REDUCTION: This exam was performed according to the departmental dose-optimization program which includes automated exposure control, adjustment of  the mA and/or kV according to patient size and/or use of iterative reconstruction technique. COMPARISON:  CT head 09/17/2020 FINDINGS: Brain: No evidence of acute infarction, hemorrhage, hydrocephalus, extra-axial collection or mass lesion/mass effect. Vascular: Symmetric hyperintensity in the middle cerebral arteries bilaterally and in the basilar. No focal vascular hyperdensity. Skull: Negative Sinuses/Orbits: Mild mucosal edema paranasal sinuses. Negative orbit Other: None ASPECTS ([place] Stroke Program Early CT Score) - Ganglionic level infarction (caudate, lentiform nuclei, internal capsule, insula, M1-M3 cortex): 7 - Supraganglionic infarction (M4-M6 cortex): 3 Total score (0-10 with 10 being normal): 10 IMPRESSION: 1. No acute intracranial abnormality. 2. ASPECTS is 10 3. Code stroke imaging results were communicated on 07/24/2021 at 7:52 pm to provider Rory Percy via text page Electronically Signed   By: Franchot Gallo M.D.   On: 07/24/2021 19:53      PROCEDURES:  Critical Care performed: No  Procedures   MEDICATIONS ORDERED IN ED: Medications  acetaminophen (TYLENOL) tablet 650 mg (has no administration in time range)  iohexol (OMNIPAQUE) 350 MG/ML injection 75 mL (75 mLs Intravenous Contrast Given 07/24/21 1949)     IMPRESSION / MDM / ASSESSMENT AND PLAN / ED COURSE  I reviewed the triage vital signs and the nursing notes.                              Differential diagnosis includes, but is not limited to, intracranial hemorrhage, TIA, CVA, aortic dissection  The patient is on the cardiac monitor to evaluate for evidence of arrhythmia and/or significant heart rate changes.  Patient is a 76 year old male with the above-stated past medical history and HPI who presents complaining of left-sided neurologic deficits.  Patient's NIH stroke score on exam was 8 however given that he took his Eliquis this morning he is no longer a candidate for thrombolytics.  Patient's perfusion scan was  evaluated by neurology independently and did not see any evidence of LVO.  On-call teleneurologist recommended MRI with admission for stroke work-up if positive.  This MRI is pending at the end of my shift.  Care of this patient will be signed out to the oncoming physician at the end of my shift.  All pertinent patient information conveyed and all questions answered.  All further care and disposition decisions will be made by the oncoming physician.       FINAL CLINICAL IMPRESSION(S) / ED DIAGNOSES   Final diagnoses:  Facial droop  Muscle weakness of left upper extremity  Slurred speech  Left-sided weakness  Numbness     Rx / DC Orders   ED Discharge Orders     None        Note:  This document was prepared using Dragon voice recognition software and may include unintentional dictation errors.   Naaman Plummer, MD 07/25/21 [phone]

## 2021-07-24 NOTE — ED Notes (Signed)
Pt to CT

## 2021-07-24 NOTE — Consult Note (Signed)
[facility] Telemedicine Consult   Requesting Provider:  Dr Cheri Fowler Consult Participants: Dr. Jerelyn Charles, Telespecialist RN Tilda Burrow   Bedside RN Lilia Pro Location of the provider: Home   Location of the patient: [place]  This consult was provided via telemedicine with 2-way video and audio communication. The patient/family was informed that care would be provided in this way and agreed to receive care in this manner.    Chief Complaint: left sided weakness  HPI: 76/M with extensive cardiac history and prior history of strokes per family member (contacted [employer] on phone listed on chart - sister, does not live with him), Afib on Eliquis last dose this morning, CAD, b/l ICA stenoses, DM, HTN, HLD - came from home via EMS for sudden onset of slurred speech, confusion and left sided deficits. Started with chest pain and left sided weakkness. Symptoms started at [place] around lunch time at noon. Preactivated by EMS as they were given 4pm LKW but indeed it is 12pm- quick exam at the bridge with dysarthria and left hemiparesis along with ?left facial droop. Taken for emergent CTH -no acute findings - no bleed. Not a candidate for TNKase - due to OSW and also Eliquis taken this AM even if he presented within window. Ordered CTA head/neck +perfusion - which was significantly delayed due to contrast extravasation needing a new line placement. CTA head and neck+perfusion done after significant delay but negative for acute LVO. Perfusion with no evidence of perfusion deficit. Does not look like blood was collected and no labs are available.  SBP per EMS in the 180s.  ROS: +CP radiating to left and back, Dizziness, weakness - generalized and left sided  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    Angioedema   Back injuries    Bladder cancer (HCC)    Cancer (HCC)    Carotid artery disease (HCC)    > 75% bilateral ICA stenoses on 01/2013 CT   Chronic pain  syndrome    Coronary artery disease    a. 2007 s/p CABG; b. 06/2020 PCI: LM 40ost, LAD nl, LCX 100p, RCA 127m - fills via L->R collats. VG->RPDA 37m/d (3.5x18 Resolute Onyx DES), VG->Diag 100, VG->OM2 ok.   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 08/2020 Echo: EF 70-75%, no rwma, Gr1 DD, nl RV fxn, triv MR, mild-mod Ao sclerosis.   Paroxysmal atrial fibrillation (HCC)    On Xarelto anticoagulation   Type 2 diabetes mellitus ([place])    A1C 6.8% in 01/2013    No current facility-administered medications for this encounter.  Current Outpatient Medications:    acetaminophen (TYLENOL) 325 MG tablet, Take 2 tablets (650 mg total) by mouth every 6 (six) hours. (Patient taking differently: Take 500-2,000 mg by mouth every 6 (six) hours.), Disp: , Rfl:    atorvastatin (LIPITOR) 80 MG tablet, Take 1 tablet (80 mg total) by mouth daily., Disp: 30 tablet, Rfl: 0   carvedilol (COREG) 6.25 MG tablet, Take 1 tablet (6.25 mg total) by mouth 2 (two) times daily with a meal., Disp: 60 tablet, Rfl: 0   clopidogrel (PLAVIX) 75 MG tablet, Take 1 tablet (75 mg total) by mouth every morning., Disp: 30 tablet, Rfl: 0   DULoxetine (  CYMBALTA) 30 MG capsule, Take 1 capsule (30 mg total) by mouth daily., Disp: 30 capsule, Rfl: 0   empagliflozin (JARDIANCE) 25 MG TABS tablet, Take 1 tablet (25 mg total) by mouth daily., Disp: 30 tablet, Rfl: 0   ezetimibe (ZETIA) 10 MG tablet, Take 1 tablet (10 mg total) by mouth daily., Disp: 30 tablet, Rfl: 0   gabapentin (NEURONTIN) 100 MG capsule, Take 1 capsule (100 mg total) by mouth 2 (two) times daily. (Patient taking differently: Take 100 mg by mouth 3 (three) times daily.), Disp: 60 capsule, Rfl: 0   isosorbide mononitrate (IMDUR) 60 MG 24 hr tablet, Take 0.5 tablets (30 mg total) by mouth daily.,  Disp: 30 tablet, Rfl: 0   melatonin 5 MG TABS, Take 1 tablet (5 mg total) by mouth at bedtime., Disp: 30 tablet, Rfl: 0   metFORMIN (GLUCOPHAGE) 500 MG tablet, Take 1 tablet (500 mg total) by mouth 2 (two) times daily with a meal. (Patient taking differently: Take 500 mg by mouth daily with breakfast.), Disp: 60 tablet, Rfl: 0   Multiple Vitamin (MULTIVITAMIN WITH MINERALS) TABS tablet, Take 1 tablet by mouth daily., Disp: 30 tablet, Rfl: 0   neomycin-bacitracin-polymyxin (NEOSPORIN) ointment, Apply 1 application topically as needed for wound care (cuts and scrapes)., Disp: , Rfl:    nitroGLYCERIN (NITROSTAT) 0.4 MG SL tablet, Place 1 tablet (0.4 mg total) under the tongue every 5 (five) minutes as needed for chest pain., Disp: 30 tablet, Rfl: 0   Nutritional Supplements (FEEDING SUPPLEMENT, GLUCERNA 1.2 CAL,) LIQD, Take 237 mLs by mouth daily in the afternoon., Disp: , Rfl:    ondansetron (ZOFRAN) 4 MG tablet, Take 1 tablet (4 mg total) by mouth daily as needed for nausea or vomiting., Disp: 20 tablet, Rfl: 0   oxymetazoline (AFRIN) 0.05 % nasal spray, Place 2 sprays into both nostrils 2 (two) times daily as needed for congestion., Disp: , Rfl:    pantoprazole (PROTONIX) 40 MG tablet, Take 1 tablet (40 mg total) by mouth daily. (Patient taking differently: Take 40 mg by mouth in the morning, at noon, in the evening, and at bedtime.), Disp: 30 tablet, Rfl: 0   polyethylene glycol (MIRALAX / GLYCOLAX) 17 g packet, Take 17 g by mouth daily., Disp: 14 each, Rfl: 0   ranolazine (RANEXA) 500 MG 12 hr tablet, Take 1 tablet (500 mg total) by mouth 2 (two) times daily., Disp: 60 tablet, Rfl: 0   rivaroxaban (XARELTO) 20 MG TABS tablet, Take 1 tablet (20 mg total) by mouth daily with supper., Disp: 30 tablet, Rfl: 0   senna-docusate (SENOKOT-S) 8.6-50 MG tablet, Take 2 tablets by mouth 2 (two) times daily., Disp: 100 tablet, Rfl: 0   spironolactone (ALDACTONE) 25 MG tablet, Take 1 tablet (25 mg total) by mouth  daily., Disp: 30 tablet, Rfl: 0   sucralfate (CARAFATE) 1 g tablet, Take 1 tablet (1 g total) by mouth 4 (four) times daily -  with meals and at bedtime., Disp: 120 tablet, Rfl: 0   Tetrahydrozoline HCl (VISINE OP), Place 1 drop into both eyes daily as needed (dry red eyes)., Disp: , Rfl:   LKW: 12pm 07/24/21 tpa given?: No, OSW, eliquis this AM IR Thrombectomy? No, no LVO Modified Rankin Scale: 0-Completely asymptomatic and back to baseline post- stroke Time of teleneurologist evaluation: at bridge 1935  Exam: There were no vitals filed for this visit.  General: AAOx3 Neurological exam AAOx3 Mild dysarthria No aphasia during communicating over the camera but could only name one  object on the card (key). Misnamed the chair as commode. Could not read any sentence although repetition intact. Tough exam of visual fields as he does not recognize fingers in any field consistently EOMI Left facial droop Drift on LUE and LLE Sensory-feels LT diminshed on left. Extinction-within constraints of camera eval with patient on the CT table with exam at bedside being assisted with the ground team - unreliable exam with question of left sided neglect   NIHSS 1A: Level of Consciousness - 0 1B: Ask Month and Age - 0 1C: 'Blink Eyes' & 'Squeeze Hands' - 1 2: Test Horizontal Extraocular Movements - 0 3: Test Visual Fields - 0 4: Test Facial Palsy - 1 5A: Test Left Arm Motor Drift - 1 5B: Test Right Arm Motor Drift - 0 6A: Test Left Leg Motor Drift - 1 6B: Test Right Leg Motor Drift - 0 7: Test Limb Ataxia - 0 8: Test Sensation - 1 9: Test Language/Aphasia- 1 10: Test Dysarthria - 1 11: Test Extinction/Inattention - 1 NIHSS score: 8   Imaging Reviewed:  CTH - no acute changes CTA head and neck - no LVO, no perfusion deficits  Labs reviewed in epic and pertinent values follow: CBC    Component Value Date/Time   WBC 8.2 03/21/2021 1543   RBC 4.39 03/21/2021 1543   HGB 15.5 03/21/2021  1543   HGB 14.9 10/24/2013 0356   HCT 43.4 03/21/2021 1543   HCT 44.7 10/24/2013 0356   PLT 179 03/21/2021 1543   PLT 212 10/24/2013 0356   MCV 98.9 03/21/2021 1543   MCV 92 10/24/2013 0356   MCH 35.3 (H) 03/21/2021 1543   MCHC 35.7 03/21/2021 1543   RDW 13.7 03/21/2021 1543   RDW 13.8 10/24/2013 0356   LYMPHSABS 1.4 02/16/2021 1817   LYMPHSABS 4.8 (H) 10/24/2013 0356   MONOABS 0.4 02/16/2021 1817   MONOABS 1.0 10/24/2013 0356   EOSABS 0.1 02/16/2021 1817   EOSABS 0.3 10/24/2013 0356   BASOSABS 0.0 02/16/2021 1817   BASOSABS 0.0 10/24/2013 0356   CMP     Component Value Date/Time   NA 132 (L) 03/21/2021 1543   NA 130 (A) 10/02/2016 0000   NA 131 (L) 10/25/2013 0440   K 3.9 03/21/2021 1543   K 3.9 10/25/2013 0440   CL 103 03/21/2021 1543   CL 97 (L) 10/25/2013 0440   CO2 20 (L) 03/21/2021 1543   CO2 26 10/25/2013 0440   GLUCOSE 167 (H) 03/21/2021 1543   GLUCOSE 131 (H) 10/25/2013 0440   BUN 19 03/21/2021 1543   BUN 15 10/02/2016 0000   BUN 22 (H) 10/25/2013 0440   CREATININE 1.12 03/21/2021 1543   CREATININE 1.58 (H) 10/25/2013 0440   CALCIUM 9.1 03/21/2021 1543   CALCIUM 9.3 10/25/2013 0440   PROT 7.0 02/17/2021 0300   PROT 7.3 02/15/2013 1123   ALBUMIN 3.8 02/17/2021 0300   ALBUMIN 3.3 (L) 02/15/2013 1123   AST 24 02/17/2021 0300   AST 23 02/15/2013 1123   ALT 24 02/17/2021 0300   ALT 32 02/15/2013 1123   ALKPHOS 53 02/17/2021 0300   ALKPHOS 94 02/15/2013 1123   BILITOT 3.2 (H) 02/17/2021 0300   BILITOT 1.2 (H) 02/15/2013 1123   GFRNONAA >60 03/21/2021 1543   GFRNONAA 44 (L) 10/25/2013 0440   GFRAA >60 12/13/2019 0422   GFRAA 51 (L) 10/25/2013 0440   Assessment:  76 year old man 76 year old man presented to the emergency room via EMS for sudden onset of chest pain  radiating to the left, followed by left-sided weakness along with slurred speech.  On examination it did look like he had some left hemiparesis, and left facial droop but his sensory exam was  inconsistent.  CT head with no acute changes.  CT angiography of the head and neck-delayed by extravasated contrast/blown line requiring second line-without any evidence of emergent large vessel occlusion.  CT perfusion study negative for perfusion deficit. Likely small vessel stroke versus weakness from a cardiac etiology as he has an extensive cardiac history and has had similar episodes with neurological symptoms in the setting of chest pain in the past.  Impression Strokelike symptoms Evaluate for acute ischemic stroke of small vessel etiology Chest pain-evaluate for ACS per EDP Chest pain with radiation to the back-rule out aortic dissection  Recommendations:  -MRI brain without contrast -If the MRI brain is negative for stroke, can continue his anticoagulation.   -If the MRI shows a new stroke, I would hold the Eliquis (with timing to resume anticoagulation dependent on the size of the stroke), continue his home Plavix, continue home statin and perform stroke risk factor work-up at that time-2D echo, A1c, lipid panel, PT/OT/speech therapy evaluations as well as telemetry and frequent neurochecks. -I would recommend checking troponins.  The EDP has ordered CT angiography of the chest abdomen pelvis dissection protocol-follow-up per ED provider. -I have relayed my plan via secure chat to the ED provider Dr. Leory Plowman -I have also discussed my plan with the bedside RN  -- Amie Portland, MD [facility] Pager: [phone] If 7pm to 7am, please call on call as listed on AMION.  CRITICAL CARE ATTESTATION Performed by: Amie Portland, MD Total critical care time: 80 minutes Critical care time was exclusive of separately billable procedures and treating other patients and/or supervising APPs/Residents/Students Critical care was necessary to treat or prevent imminent or life-threatening deterioration due to stroke like symptoms, evaluation for VI vs IA thrombolysis/thrombectomy  This  patient is critically ill and at significant risk for neurological worsening and/or death and care requires constant monitoring. Critical care was time spent personally by me on the following activities: development of treatment plan with patient and/or surrogate as well as nursing, discussions with consultants, evaluation of patient's response to treatment, examination of patient, obtaining history from patient or surrogate, ordering and performing treatments and interventions, ordering and review of laboratory studies, ordering and review of radiographic studies, pulse oximetry, re-evaluation of patient's condition, participation in multidisciplinary rounds and medical decision making of high complexity in the care of this patient.'

## 2021-07-24 NOTE — Progress Notes (Signed)
°   07/24/21 2050  Clinical Encounter Type  Visited With Patient  Visit Type Initial;Spiritual support;Social support  Spiritual Encounters  Spiritual Needs Emotional;Prayer   Chaplain Burris offered non-anxious, compassionate presence and support to Pt after returning from CT scan. Pt was tearful and appreciative of pastoral support presence. Pt has a very strong faith outlook and derives meaning and strength from his faith. Chaplain Burris provided active listening and also assisted Pt with phone call to notify his sister, Bryan Scott, that Pt is at [facility]. Bryan Scott will attempt to f/u later this eve if possible and will be available throughout the night as needed.

## 2021-07-25 ENCOUNTER — Encounter: Payer: Self-pay | Admitting: Internal Medicine

## 2021-07-25 DIAGNOSIS — I7121 Aneurysm of the ascending aorta, without rupture: Secondary | ICD-10-CM | POA: Diagnosis not present

## 2021-07-25 DIAGNOSIS — I10 Essential (primary) hypertension: Secondary | ICD-10-CM

## 2021-07-25 DIAGNOSIS — I5032 Chronic diastolic (congestive) heart failure: Secondary | ICD-10-CM | POA: Diagnosis present

## 2021-07-25 DIAGNOSIS — R0789 Other chest pain: Secondary | ICD-10-CM | POA: Diagnosis not present

## 2021-07-25 DIAGNOSIS — G459 Transient cerebral ischemic attack, unspecified: Principal | ICD-10-CM

## 2021-07-25 DIAGNOSIS — I480 Paroxysmal atrial fibrillation: Secondary | ICD-10-CM

## 2021-07-25 DIAGNOSIS — E1151 Type 2 diabetes mellitus with diabetic peripheral angiopathy without gangrene: Secondary | ICD-10-CM

## 2021-07-25 DIAGNOSIS — E785 Hyperlipidemia, unspecified: Secondary | ICD-10-CM

## 2021-07-25 DIAGNOSIS — R079 Chest pain, unspecified: Secondary | ICD-10-CM | POA: Diagnosis not present

## 2021-07-25 LAB — BASIC METABOLIC PANEL
Anion gap: 8 (ref 5–15)
BUN: 16 mg/dL (ref 8–23)
CO2: 23 mmol/L (ref 22–32)
Calcium: 9 mg/dL (ref 8.9–10.3)
Chloride: 104 mmol/L (ref 98–111)
Creatinine, Ser: 1 mg/dL (ref 0.61–1.24)
GFR, Estimated: >60 mL/min (ref >60)
Glucose, Bld: 177 mg/dL — ABNORMAL HIGH (ref 70–99)
Potassium: 3.7 mmol/L (ref 3.5–5.1)
Sodium: 135 mmol/L (ref 135–145)

## 2021-07-25 LAB — GLUCOSE, CAPILLARY
Glucose-Capillary: 231 mg/dL — ABNORMAL HIGH (ref 70–99)
Glucose-Capillary: 204 mg/dL — ABNORMAL HIGH (ref 70–99)

## 2021-07-25 LAB — LACTATE DEHYDROGENASE: LDH: 121 U/L (ref 98–192)

## 2021-07-25 LAB — TROPONIN I (HIGH SENSITIVITY): Troponin I (High Sensitivity): 7 ng/L (ref <18)

## 2021-07-25 MED ORDER — MORPHINE SULFATE (PF) 4 MG/ML IV SOLN
4.0000 mg | Freq: Once | INTRAVENOUS | Status: AC
  Administered 2021-07-25: 4 mg via INTRAVENOUS
  Filled 2021-07-25: qty 1

## 2021-07-25 MED ORDER — RIVAROXABAN 20 MG PO TABS
20.0000 mg | ORAL_TABLET | Freq: Every day | ORAL | Status: DC
Start: 2021-07-25 — End: 2021-07-25

## 2021-07-25 MED ORDER — EZETIMIBE 10 MG PO TABS
10.0000 mg | ORAL_TABLET | Freq: Every day | ORAL | Status: DC
Start: 2021-07-26 — End: 2021-08-03
  Administered 2021-07-26 – 2021-08-02 (×8): 10 mg via ORAL
  Filled 2021-07-25 (×8): qty 1

## 2021-07-25 MED ORDER — ACETAMINOPHEN 160 MG/5ML PO SOLN
650.0000 mg | ORAL | Status: DC | PRN
  Filled 2021-07-25: qty 20.3

## 2021-07-25 MED ORDER — INSULIN ASPART 100 UNIT/ML IJ SOLN
0.0000 [IU] | Freq: Three times a day (TID) | INTRAMUSCULAR | Status: DC
  Administered 2021-07-26 (×2): 2 [IU] via SUBCUTANEOUS
  Administered 2021-07-26: 3 [IU] via SUBCUTANEOUS
  Administered 2021-07-27: 08:00:00 2 [IU] via SUBCUTANEOUS
  Administered 2021-07-27: 12:00:00 5 [IU] via SUBCUTANEOUS
  Administered 2021-07-27: 3 [IU] via SUBCUTANEOUS
  Administered 2021-07-28: 2 [IU] via SUBCUTANEOUS
  Administered 2021-07-28: 3 [IU] via SUBCUTANEOUS
  Administered 2021-07-28: 08:00:00 2 [IU] via SUBCUTANEOUS
  Administered 2021-07-29: 3 [IU] via SUBCUTANEOUS
  Administered 2021-07-29: 5 [IU] via SUBCUTANEOUS
  Administered 2021-07-29 – 2021-07-30 (×3): 3 [IU] via SUBCUTANEOUS
  Administered 2021-07-30: 12:00:00 5 [IU] via SUBCUTANEOUS
  Administered 2021-07-31: 08:00:00 2 [IU] via SUBCUTANEOUS
  Administered 2021-07-31 (×2): 3 [IU] via SUBCUTANEOUS
  Administered 2021-08-01: 09:00:00 2 [IU] via SUBCUTANEOUS
  Administered 2021-08-01: 9 [IU] via SUBCUTANEOUS
  Administered 2021-08-01 – 2021-08-02 (×4): 3 [IU] via SUBCUTANEOUS
  Filled 2021-07-25 (×24): qty 1

## 2021-07-25 MED ORDER — RIVAROXABAN 20 MG PO TABS: 20.0000 mg | ORAL_TABLET | Freq: Every day | ORAL | Status: DC

## 2021-07-25 MED ORDER — MORPHINE SULFATE (PF) 2 MG/ML IV SOLN
2.0000 mg | INTRAVENOUS | Status: DC | PRN
  Administered 2021-07-25 – 2021-07-29 (×16): 2 mg via INTRAVENOUS
  Filled 2021-07-25 (×16): qty 1

## 2021-07-25 MED ORDER — SPIRONOLACTONE 25 MG PO TABS: 25.0000 mg | ORAL_TABLET | Freq: Every day | ORAL | Status: DC

## 2021-07-25 MED ORDER — POLYETHYLENE GLYCOL 3350 17 G PO PACK
17.0000 g | PACK | Freq: Every day | ORAL | Status: DC | PRN
  Administered 2021-07-28 – 2021-07-29 (×2): 17 g via ORAL
  Filled 2021-07-25 (×2): qty 1

## 2021-07-25 MED ORDER — TETRAHYDROZOLINE HCL 0.05 % OP SOLN
1.0000 [drp] | Freq: Every day | OPHTHALMIC | Status: DC | PRN
  Filled 2021-07-25: qty 15

## 2021-07-25 MED ORDER — NITROGLYCERIN 0.4 MG SL SUBL: 0.4000 mg | SUBLINGUAL_TABLET | SUBLINGUAL | Status: DC | PRN

## 2021-07-25 MED ORDER — HYDRALAZINE HCL 20 MG/ML IJ SOLN: 5.0000 mg | INTRAMUSCULAR | Status: DC | PRN

## 2021-07-25 MED ORDER — ISOSORBIDE MONONITRATE ER 30 MG PO TB24: 30.0000 mg | ORAL_TABLET | Freq: Every day | ORAL | Status: DC

## 2021-07-25 MED ORDER — ATORVASTATIN CALCIUM 20 MG PO TABS
80.0000 mg | ORAL_TABLET | Freq: Every day | ORAL | Status: DC
Start: 2021-07-25 — End: 2021-08-03
  Administered 2021-07-25 – 2021-08-01 (×8): 80 mg via ORAL
  Filled 2021-07-25 (×7): qty 4

## 2021-07-25 MED ORDER — CARVEDILOL 6.25 MG PO TABS
6.2500 mg | ORAL_TABLET | Freq: Two times a day (BID) | ORAL | Status: DC
  Administered 2021-07-25 – 2021-08-02 (×16): 6.25 mg via ORAL
  Filled 2021-07-25 (×16): qty 1

## 2021-07-25 MED ORDER — GABAPENTIN 100 MG PO CAPS
100.0000 mg | ORAL_CAPSULE | Freq: Three times a day (TID) | ORAL | Status: DC
  Administered 2021-07-25 – 2021-08-02 (×25): 100 mg via ORAL
  Filled 2021-07-25 (×24): qty 1

## 2021-07-25 MED ORDER — CLOPIDOGREL BISULFATE 75 MG PO TABS
75.0000 mg | ORAL_TABLET | Freq: Every morning | ORAL | Status: DC
  Administered 2021-07-26 – 2021-08-02 (×8): 75 mg via ORAL
  Filled 2021-07-25 (×6): qty 1

## 2021-07-25 MED ORDER — SUCRALFATE 1 G PO TABS
1.0000 g | ORAL_TABLET | Freq: Three times a day (TID) | ORAL | Status: DC
  Administered 2021-07-25 – 2021-08-02 (×33): 1 g via ORAL
  Filled 2021-07-25 (×32): qty 1

## 2021-07-25 MED ORDER — INSULIN ASPART 100 UNIT/ML IJ SOLN
0.0000 [IU] | Freq: Every day | INTRAMUSCULAR | Status: DC
  Administered 2021-07-25 – 2021-07-29 (×3): 2 [IU] via SUBCUTANEOUS
  Administered 2021-07-30: 3 [IU] via SUBCUTANEOUS
  Administered 2021-07-31: 2 [IU] via SUBCUTANEOUS
  Filled 2021-07-25 (×5): qty 1

## 2021-07-25 MED ORDER — APIXABAN 5 MG PO TABS: 5.0000 mg | ORAL_TABLET | Freq: Two times a day (BID) | ORAL | Status: DC

## 2021-07-25 MED ORDER — ONDANSETRON HCL 4 MG/2ML IJ SOLN
4.0000 mg | Freq: Once | INTRAMUSCULAR | Status: AC
  Administered 2021-07-25: 4 mg via INTRAVENOUS
  Filled 2021-07-25: qty 2

## 2021-07-25 MED ORDER — ACETAMINOPHEN 650 MG RE SUPP: 650.0000 mg | RECTAL | Status: DC | PRN

## 2021-07-25 MED ORDER — ADULT MULTIVITAMIN W/MINERALS CH
1.0000 | ORAL_TABLET | Freq: Every day | ORAL | Status: DC
  Administered 2021-07-26 – 2021-08-02 (×8): 1 via ORAL
  Filled 2021-07-25 (×8): qty 1

## 2021-07-25 MED ORDER — SENNOSIDES-DOCUSATE SODIUM 8.6-50 MG PO TABS
2.0000 | ORAL_TABLET | Freq: Two times a day (BID) | ORAL | Status: DC
  Administered 2021-07-26 – 2021-08-02 (×13): 2 via ORAL
  Filled 2021-07-25 (×13): qty 2

## 2021-07-25 MED ORDER — NITROGLYCERIN 2 % TD OINT
0.5000 [in_us] | TOPICAL_OINTMENT | Freq: Once | TRANSDERMAL | Status: AC
  Administered 2021-07-25: 0.5 [in_us] via TOPICAL
  Filled 2021-07-25: qty 1

## 2021-07-25 MED ORDER — GLUCERNA 1.2 CAL PO LIQD
237.0000 mL | Freq: Every day | ORAL | Status: DC
  Administered 2021-07-25: 17:00:00 237 mL via ORAL

## 2021-07-25 MED ORDER — PANTOPRAZOLE SODIUM 40 MG PO TBEC
40.0000 mg | DELAYED_RELEASE_TABLET | Freq: Every day | ORAL | Status: DC
  Administered 2021-07-25 – 2021-08-02 (×9): 40 mg via ORAL
  Filled 2021-07-25 (×9): qty 1

## 2021-07-25 MED ORDER — ONDANSETRON HCL 4 MG/2ML IJ SOLN
4.0000 mg | Freq: Three times a day (TID) | INTRAMUSCULAR | Status: DC | PRN
  Administered 2021-07-25 – 2021-07-31 (×8): 4 mg via INTRAVENOUS
  Filled 2021-07-25 (×9): qty 2

## 2021-07-25 MED ORDER — SENNOSIDES-DOCUSATE SODIUM 8.6-50 MG PO TABS: 1.0000 | ORAL_TABLET | Freq: Every evening | ORAL | Status: DC | PRN

## 2021-07-25 MED ORDER — APIXABAN 5 MG PO TABS
5.0000 mg | ORAL_TABLET | Freq: Two times a day (BID) | ORAL | Status: DC
  Administered 2021-07-25 – 2021-08-02 (×16): 5 mg via ORAL
  Filled 2021-07-25 (×15): qty 1

## 2021-07-25 MED ORDER — RANOLAZINE ER 500 MG PO TB12
500.0000 mg | ORAL_TABLET | Freq: Two times a day (BID) | ORAL | Status: DC
  Administered 2021-07-25 – 2021-08-02 (×16): 500 mg via ORAL
  Filled 2021-07-25 (×17): qty 1

## 2021-07-25 MED ORDER — NITROGLYCERIN 2 % TD OINT: 1.0000 [in_us] | TOPICAL_OINTMENT | Freq: Once | TRANSDERMAL | Status: DC

## 2021-07-25 MED ORDER — ACETAMINOPHEN 325 MG PO TABS
650.0000 mg | ORAL_TABLET | ORAL | Status: DC | PRN
  Administered 2021-07-25: 650 mg via ORAL
  Filled 2021-07-25: qty 2

## 2021-07-25 MED ORDER — DULOXETINE HCL 30 MG PO CPEP
30.0000 mg | ORAL_CAPSULE | Freq: Every day | ORAL | Status: DC
  Administered 2021-07-25 – 2021-08-02 (×9): 30 mg via ORAL
  Filled 2021-07-25 (×9): qty 1

## 2021-07-25 MED ORDER — STROKE: EARLY STAGES OF RECOVERY BOOK: Freq: Once | Status: AC

## 2021-07-25 MED ORDER — MELATONIN 5 MG PO TABS
5.0000 mg | ORAL_TABLET | Freq: Every day | ORAL | Status: DC
  Administered 2021-07-25 – 2021-08-01 (×8): 5 mg via ORAL
  Filled 2021-07-25 (×7): qty 1

## 2021-07-25 NOTE — Evaluation (Signed)
Physical Therapy Evaluation Patient Details Name: Bryan Scott MRN: [id number] DOB: [birth year] Today's Date: 07/25/2021  History of Present Illness  76 y/o male who came in with L sided weakness and chest pain.  history of of CAD, A-fib on Eliquis with last dose this morning, hypertension, diabetes, hyperlipidemia, and previous strokes who presents for sudden onset of slurred speech, confusion, and left-sided deficits.  Code stroke, imaging is negtive for acute changes.  Clinical Impression  Pt willing to participate with PT but anxious about how much he will be able to do.  Pt states that he doesn't feel like he can trust his L leg to hold him and while he did need to keep knee locked and showed decreased coordination during foot placement/weight acceptance he was able to do a little bit of side stepping, fwd/backward stepping with heavy walker reliance and minimal assist from PT. Pt showed good overall effort, clearly far from his baseline and unsafe to try to return home alone, recommending STR.       Recommendations for follow up therapy are one component of a multi-disciplinary discharge planning process, led by the attending physician.  Recommendations may be updated based on patient status, additional functional criteria and insurance authorization.  Follow Up Recommendations Skilled nursing-short term rehab (<3 hours/day)    Assistance Recommended at Discharge Intermittent Supervision/Assistance  Patient can return home with the following  A lot of help with walking and/or transfers;A lot of help with bathing/dressing/bathroom;Assistance with cooking/housework;Assistance with feeding;Assist for transportation;Help with stairs or ramp for entrance    Equipment Recommendations  (TBD at rehab)  Recommendations for Other Services       Functional Status Assessment Patient has had a recent decline in their functional status and demonstrates the ability to make significant improvements in  function in a reasonable and predictable amount of time.     Precautions / Restrictions Precautions Precautions: Fall Restrictions Weight Bearing Restrictions: No      Mobility  Bed Mobility Overal bed mobility: Modified Independent             General bed mobility comments: Pt needed UEs pulling on bed rail, but able to rise to sitting w/o direct assist    Transfers Overall transfer level: Needs assistance Equipment used: Rolling walker (2 wheels) Transfers: Sit to/from Stand Sit to Stand: From elevated surface, Min assist           General transfer comment: cuing for UE use and positioning.  Showed good forward weight shift, heavily reliant on R U&LE leaning to R side, light assist to initiate upward momentum with pt doing bulk of the effort to attain standing    Ambulation/Gait Ambulation/Gait assistance: Min assist Gait Distance (Feet): 5 Feet Assistive device: Rolling walker (2 wheels)         General Gait Details: Pt with good, if hesitant, effort.  Functional but poorly coordinated L LE use. 2 bouts of ~70ft EOB side stepping R&L as well as 1 bout forward and back... pt with significant fatigue with the effort and requests to sit (not in the recliner) relatively quickly after this modest, but taxing effort  Stairs            Wheelchair Mobility    Modified Rankin (Stroke Patients Only)       Balance Overall balance assessment: Needs assistance Sitting-balance support: No upper extremity supported Sitting balance-Leahy Scale: Good       Standing balance-Leahy Scale: Fair Standing balance comment: highly reliant on the  walker, no overt LOBs but guarded and fearful of falling as he did not trust L upper or lower extremities                             Pertinent Vitals/Pain Pain Assessment Pain Assessment:  (reports numbness/pain t/o L side (face, arm, chest, leg), unrated and did not seem to functionally limit him during activity)     Home Living Family/patient expects to be discharged to:: Skilled nursing facility Living Arrangements: Alone Available Help at Discharge:  (documentation from last year indicate sister able to assist, pt today reports little to no support available) Type of Home: House Home Access: Ramped entrance       Home Layout: One level Home Equipment: Rollator (4 wheels)      Prior Function Prior Level of Function : Independent/Modified Independent             Mobility Comments: Pt uses [employer] all the time but reports able to drive to grocery store and run light errands on "good days"       Hand Dominance   Dominant Hand: Right    Extremity/Trunk Assessment   Upper Extremity Assessment Upper Extremity Assessment: LUE deficits/detail;Defer to OT evaluation (R WFL) LUE Deficits / Details: Pt was able to functionally take weight through L UE/RW and grab bed rail during transition to sitting.  <3/5 with testing, shld elevation to only 45    Lower Extremity Assessment Lower Extremity Assessment: LLE deficits/detail (E WFL) LLE Deficits / Details: Pt showed decreased quality of movement and grossly 3+/5 strength with testing       Communication   Communication: No difficulties  Cognition Arousal/Alertness: Awake/alert Behavior During Therapy: Anxious Overall Cognitive Status: Within Functional Limits for tasks assessed                                          General Comments General comments (skin integrity, edema, etc.): HR in the 70s on arrival, up to ~90bpm with activity    Exercises     Assessment/Plan    PT Assessment Patient needs continued PT services  PT Problem List Decreased strength;Decreased range of motion;Decreased activity tolerance;Decreased balance;Decreased mobility;Decreased coordination;Decreased knowledge of use of DME;Decreased safety awareness;Pain       PT Treatment Interventions DME instruction;Gait training;Functional  mobility training;Therapeutic activities;Therapeutic exercise;Balance training;Neuromuscular re-education;Patient/family education    PT Goals (Current goals can be found in the Care Plan section)  Acute Rehab PT Goals Patient Stated Goal: get back to walking PT Goal Formulation: With patient Time For Goal Achievement: 08/08/21 Potential to Achieve Goals: Fair    Frequency Min 2X/week     Co-evaluation               AM-PAC PT "6 Clicks" Mobility  Outcome Measure Help needed turning from your back to your side while in a flat bed without using bedrails?: None Help needed moving from lying on your back to sitting on the side of a flat bed without using bedrails?: None Help needed moving to and from a bed to a chair (including a wheelchair)?: A Little Help needed standing up from a chair using your arms (e.g., wheelchair or bedside chair)?: A Little Help needed to walk in hospital room?: A Little Help needed climbing 3-5 steps with a railing? : A Lot 6 Click Score: 19  End of Session Equipment Utilized During Treatment: Gait belt Activity Tolerance: Patient limited by fatigue;Patient tolerated treatment well Patient left: with bed alarm set;with call bell/phone within reach Nurse Communication: Mobility status PT Visit Diagnosis: Muscle weakness (generalized) (M62.81);Difficulty in walking, not elsewhere classified (R26.2);Unsteadiness on feet (R26.81);Hemiplegia and hemiparesis Hemiplegia - Right/Left: Left Hemiplegia - dominant/non-dominant: Non-dominant Hemiplegia - caused by: Unspecified    Time: 0397-9536 PT Time Calculation (min) (ACUTE ONLY): 20 min   Charges:   PT Evaluation $PT Eval Low Complexity: 1 Low PT Treatments $Therapeutic Activity: 8-22 mins        Kreg Shropshire, DPT 07/25/2021, 12:56 PM

## 2021-07-25 NOTE — H&P (Signed)
History and Physical    Bryan Scott AST:[id number] DOB: 09-Jan-1946 DOA: 07/24/2021  Referring MD/NP/PA:   PCP: [name], MD   Patient coming from:  The patient is coming from home.  At baseline, pt is independent for most of ADL.        Chief Complaint: chest pain and left-sided weakness  HPI: Bryan Scott is a 76 y.o. male with medical history significant of hypertension, hyperlipidemia, diabetes mellitus, depression, PAF on Eliquis, former smoker, carotid artery stenosis, pancreatitis, dCHF, bladder cancer, who presents with chest pain, left-sided weakness.  Pt was last known normal at about 4 PM yesterday. He states that he developed left-sided weakness in left arm and leg, left facial droop, blurry vision and slurred speech.  He states that his symptoms have improved, but still has intermittent blurry vision, and some left-sided weakness.  He also reports chest pain, which is located in substernal area, usually 10 out of 10 in severity, currently 7 out of 10 severity, pressure-like, radiating to the left arm.  Has mild shortness breath, cough, fever or chills.  Patient states he has nausea, vomited once yesterday, and had 1 episode of loose stool yesterday, no abdominal pain.  No symptoms of UTI.  Data Reviewed and ED Course: pt was found to have troponin level 8 --> 9 --> 7.  INR 1.1, PTT 33, negative COVID PCR, alcohol level less than 10, negative urinalysis, AKI with creatinine 1.26, BUN 20, GFR 59.  Temperature normal, blood pressure 167/148, 125/76, heart rate 64, 16, oxygen saturation 95% on 2 L oxygen (patient is not using oxygen normally at home).  MRI of the brain is negative for stroke.  CT angiogram of chest/abdomen/pelvis is negative for dissection, but showed 4 cm ascending thoracic aneurysm.  Patient is placed on telemetry bed for observation.  Dr. Malen Gauze of neuro and Dr. Saralyn Pilar of cardiology are consulted.   EKG: I have personally reviewed.  Sinus rhythm, QTc 422, Q  wave in lead III and aVF, borderline right axis deviation   Review of Systems:   General: no fevers, chills, no body weight gain, has fatigue HEENT: no blurry vision, hearing changes or sore throat Respiratory: has dyspnea, no coughing, wheezing CV: has chest pain, no palpitations GI: had nausea, vomiting,  diarrhea, no constipation, abdominal pain, GU: no dysuria, burning on urination, increased urinary frequency, hematuria  Ext: no leg edema Neuro: has left-sided weakness, blurry vision, slurred speech, left facial droop Skin: no rash, no skin tear. MSK: No muscle spasm, no deformity, no limitation of range of movement in spin Heme: No easy bruising.  Travel history: No recent long distant travel.   Allergy:  Allergies  Allergen Reactions   Aspergum [Aspirin] Anaphylaxis   Aspirin Swelling   Lisinopril Anaphylaxis   Angiotensin Receptor Blockers     Never taken but PMH of angioedema with ACE-I ( Lisinopril)   Heparin Other (See Comments)    Reaction:  Bleeding     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    Angioedema   Back injuries    Bladder cancer (HCC)    Cancer (HCC)    Carotid artery disease (HCC)    > 75% bilateral ICA stenoses on 01/2013 CT   Chronic pain syndrome    Coronary artery disease    a. 2007 s/p CABG; b. 06/2020 PCI: LM 40ost, LAD nl, LCX 100p, RCA 138m - fills via L->R collats. VG->RPDA 4m/d (3.5x18 Resolute Onyx DES),  VG->Diag 100, VG->OM2 ok.   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 08/2020 Echo: EF 70-75%, no rwma, Gr1 DD, nl RV fxn, triv MR, mild-mod Ao sclerosis.   Paroxysmal atrial fibrillation (HCC)    On Xarelto anticoagulation   Type 2 diabetes mellitus ([place])    A1C 6.8% in 01/2013    Past Surgical History:  Procedure  Laterality Date   CARDIAC CATHETERIZATION     CORONARY ARTERY BYPASS GRAFT  2007   CORONARY STENT INTERVENTION N/A 07/13/2020   Procedure: CORONARY STENT INTERVENTION;  Surgeon: Nelva Bush, MD;  Location: [facility];  Service: Cardiovascular;  Laterality: N/A;   ESOPHAGOGASTRODUODENOSCOPY (EGD) WITH PROPOFOL N/A 09/20/2020   Procedure: ESOPHAGOGASTRODUODENOSCOPY (EGD) WITH PROPOFOL;  Surgeon: Ladene Artist, MD;  Location: [facility];  Service: Endoscopy;  Laterality: N/A;   LEFT HEART CATH AND CORS/GRAFTS ANGIOGRAPHY N/A 07/13/2020   Procedure: LEFT HEART CATH AND CORS/GRAFTS ANGIOGRAPHY;  Surgeon: Minna Merritts, MD;  Location: [facility];  Service: Cardiovascular;  Laterality: N/A;    Social History:  reports that he has quit smoking. His smoking use included cigarettes. He has a 30.00 pack-year smoking history. He has never used smokeless tobacco. He reports that he does not currently use alcohol. He reports that he does not use drugs.  Family History:  Family History  Problem Relation Age of Onset   Heart disease Mother 45       Deceased   CVA Mother    Seizures Father 86       Deceased from complications with seizures     Prior to Admission medications   Medication Sig Start Date End Date Taking? Authorizing Provider  acetaminophen (TYLENOL) 325 MG tablet Take 2 tablets (650 mg total) by mouth every 6 (six) hours. Patient taking differently: Take 500-2,000 mg by mouth every 6 (six) hours. 09/25/20   Lurline Del, DO  atorvastatin (LIPITOR) 80 MG tablet Take 1 tablet (80 mg total) by mouth daily. 10/11/20   Medina-Vargas, Monina C, NP  carvedilol (COREG) 6.25 MG tablet Take 1 tablet (6.25 mg total) by mouth 2 (two) times daily with a meal. 10/11/20   Medina-Vargas, Monina C, NP  clopidogrel (PLAVIX) 75 MG tablet Take 1 tablet (75 mg total) by mouth every morning. 10/11/20   Medina-Vargas, Monina C, NP  DULoxetine (CYMBALTA) 30 MG capsule Take 1 capsule (30  mg total) by mouth daily. 10/11/20   Medina-Vargas, Monina C, NP  empagliflozin (JARDIANCE) 25 MG TABS tablet Take 1 tablet (25 mg total) by mouth daily. 10/11/20   Medina-Vargas, Monina C, NP  ezetimibe (ZETIA) 10 MG tablet Take 1 tablet (10 mg total) by mouth daily. 10/11/20   Medina-Vargas, Monina C, NP  gabapentin (NEURONTIN) 100 MG capsule Take 1 capsule (100 mg total) by mouth 2 (two) times daily. Patient taking differently: Take 100 mg by mouth 3 (three) times daily. 10/11/20   Medina-Vargas, Monina C, NP  isosorbide mononitrate (IMDUR) 60 MG 24 hr tablet Take 0.5 tablets (30 mg total) by mouth daily. 02/19/21   Shelly Coss, MD  melatonin 5 MG TABS Take 1 tablet (5 mg total) by mouth at bedtime. 10/11/20   Medina-Vargas, Monina C, NP  metFORMIN (GLUCOPHAGE) 500 MG tablet Take 1 tablet (  500 mg total) by mouth 2 (two) times daily with a meal. Patient taking differently: Take 500 mg by mouth daily with breakfast. 10/11/20   Medina-Vargas, Monina C, NP  Multiple Vitamin (MULTIVITAMIN WITH MINERALS) TABS tablet Take 1 tablet by mouth daily. 08/10/20   Fritzi Mandes, MD  neomycin-bacitracin-polymyxin (NEOSPORIN) ointment Apply 1 application topically as needed for wound care (cuts and scrapes).    [provider]  nitroGLYCERIN (NITROSTAT) 0.4 MG SL tablet Place 1 tablet (0.4 mg total) under the tongue every 5 (five) minutes as needed for chest pain. 10/11/20   Medina-Vargas, Monina C, NP  Nutritional Supplements (FEEDING SUPPLEMENT, GLUCERNA 1.2 CAL,) LIQD Take 237 mLs by mouth daily in the afternoon.    [provider]  ondansetron (ZOFRAN) 4 MG tablet Take 1 tablet (4 mg total) by mouth daily as needed for nausea or vomiting. 02/19/21 02/19/22  Shelly Coss, MD  oxymetazoline (AFRIN) 0.05 % nasal spray Place 2 sprays into both nostrils 2 (two) times daily as needed for congestion.    [provider]  pantoprazole (PROTONIX) 40 MG tablet Take 1 tablet (40 mg total) by mouth  daily. Patient taking differently: Take 40 mg by mouth in the morning, at noon, in the evening, and at bedtime. 10/11/20   Medina-Vargas, Monina C, NP  polyethylene glycol (MIRALAX / GLYCOLAX) 17 g packet Take 17 g by mouth daily. 09/25/20   Lurline Del, DO  ranolazine (RANEXA) 500 MG 12 hr tablet Take 1 tablet (500 mg total) by mouth 2 (two) times daily. 02/19/21   Shelly Coss, MD  rivaroxaban (XARELTO) 20 MG TABS tablet Take 1 tablet (20 mg total) by mouth daily with supper. 10/11/20   Medina-Vargas, Monina C, NP  senna-docusate (SENOKOT-S) 8.6-50 MG tablet Take 2 tablets by mouth 2 (two) times daily. 09/06/20   Sharen Hones, MD  spironolactone (ALDACTONE) 25 MG tablet Take 1 tablet (25 mg total) by mouth daily. 10/11/20   Medina-Vargas, Monina C, NP  sucralfate (CARAFATE) 1 g tablet Take 1 tablet (1 g total) by mouth 4 (four) times daily -  with meals and at bedtime. 10/11/20   Medina-Vargas, Monina C, NP  Tetrahydrozoline HCl (VISINE OP) Place 1 drop into both eyes daily as needed (dry red eyes).    [provider]    Physical Exam: Vitals:   07/25/21 0900 07/25/21 1034 07/25/21 1223 07/25/21 1625  BP: (!) 144/90 (!) 150/88 136/86 (!) 168/73  Pulse: 62 72  65  Resp: 16 18  20   Temp:  97.8 F (36.6 C) 98 F (36.7 C) 99.6 F (37.6 C)  TempSrc:   Oral Oral  SpO2: 97% 98% 97% 100%  Weight:      Height:       General: Not in acute distress HEENT:       Eyes: PERRL, EOMI, no scleral icterus.       ENT: No discharge from the ears and nose, no pharynx injection, no tonsillar enlargement.        Neck: No JVD, no bruit, no mass felt. Heme: No neck lymph node enlargement. Cardiac: S1/S2, RRR, No murmurs, No gallops or rubs. Respiratory: No rales, wheezing, rhonchi or rubs. GI: Soft, nondistended, nontender, no rebound pain, no organomegaly, BS present. GU: No hematuria Ext: No pitting leg edema bilaterally. 1+DP/PT pulse bilaterally. Musculoskeletal: No joint deformities, No  joint redness or warmth, no limitation of ROM in spin. Skin: No rashes.  Neuro: Alert, oriented X3, cranial nerves II-XII grossly intact, muscle  strength 4/5 in left arm and left leg, 5 out of 5 in all extremities. Psych: Patient is not psychotic, no suicidal or hemocidal ideation.  Labs on Admission: I have personally reviewed following labs and imaging studies  CBC: Recent Labs  Lab 07/24/21 2048  WBC 7.2  NEUTROABS 3.8  HGB 15.0  HCT 44.1  MCV 96.5  PLT 163   Basic Metabolic Panel: Recent Labs  Lab 07/24/21 2048 07/25/21 0825  NA 131* 135  K 3.6 3.7  CL 101 104  CO2 23 23  GLUCOSE 150* 177*  BUN 20 16  CREATININE 1.26* 1.00  CALCIUM 8.8* 9.0   GFR: Estimated Creatinine Clearance: 69 mL/min (by C-G formula based on SCr of 1 mg/dL). Liver Function Tests: Recent Labs  Lab 07/24/21 2048  AST 18  ALT 21  ALKPHOS 77  BILITOT 1.2  PROT 6.4*  ALBUMIN 3.4*   No results for input(s): LIPASE, AMYLASE in the last 168 hours. No results for input(s): AMMONIA in the last 168 hours. Coagulation Profile: Recent Labs  Lab 07/24/21 2048  INR 1.1   Cardiac Enzymes: No results for input(s): CKTOTAL, CKMB, CKMBINDEX, TROPONINI in the last 168 hours. BNP (last 3 results) No results for input(s): PROBNP in the last 8760 hours. HbA1C: No results for input(s): HGBA1C in the last 72 hours. CBG: Recent Labs  Lab 07/24/21 1937 07/25/21 1040  GLUCAP 181* 231*   Lipid Profile: No results for input(s): CHOL, HDL, LDLCALC, TRIG, CHOLHDL, LDLDIRECT in the last 72 hours. Thyroid Function Tests: No results for input(s): TSH, T4TOTAL, FREET4, T3FREE, THYROIDAB in the last 72 hours. Anemia Panel: No results for input(s): VITAMINB12, FOLATE, FERRITIN, TIBC, IRON, RETICCTPCT in the last 72 hours. Urine analysis:    Component Value Date/Time   COLORURINE STRAW (A) 07/24/2021 2048   APPEARANCEUR CLEAR (A) 07/24/2021 2048   APPEARANCEUR Clear 10/15/2013 2200   LABSPEC >1.046 (H)  07/24/2021 2048   LABSPEC 1.012 10/15/2013 2200   PHURINE 6.0 07/24/2021 2048   GLUCOSEU >=500 (A) 07/24/2021 2048   GLUCOSEU Negative 10/15/2013 2200   HGBUR NEGATIVE 07/24/2021 2048   BILIRUBINUR NEGATIVE 07/24/2021 2048   BILIRUBINUR Negative 10/15/2013 2200   KETONESUR NEGATIVE 07/24/2021 2048   PROTEINUR NEGATIVE 07/24/2021 2048   NITRITE NEGATIVE 07/24/2021 2048   LEUKOCYTESUR NEGATIVE 07/24/2021 2048   LEUKOCYTESUR Negative 10/15/2013 2200   Sepsis Labs: @LABRCNTIP (procalcitonin:4,lacticidven:4) ) Recent Results (from the past 240 hour(s))  Resp Panel by RT-PCR (Flu A&B, Covid) Nasopharyngeal Swab     Status: None   Collection Time: 07/24/21  8:48 PM   Specimen: Nasopharyngeal Swab; Nasopharyngeal(NP) swabs in vial transport medium  Result Value Ref Range Status   SARS Coronavirus 2 by RT PCR NEGATIVE NEGATIVE Final    Comment: (NOTE) SARS-CoV-2 target nucleic acids are NOT DETECTED.  The SARS-CoV-2 RNA is generally detectable in upper respiratory specimens during the acute phase of infection. The lowest concentration of SARS-CoV-2 viral copies this assay can detect is 138 copies/mL. A negative result does not preclude SARS-Cov-2 infection and should not be used as the sole basis for treatment or other patient management decisions. A negative result may occur with  improper specimen collection/handling, submission of specimen other than nasopharyngeal swab, presence of viral mutation(s) within the areas targeted by this assay, and inadequate number of viral copies(<138 copies/mL). A negative result must be combined with clinical observations, patient history, and epidemiological information. The expected result is Negative.  Fact Sheet for Patients:  EntrepreneurPulse.com.au  Fact Sheet  for Healthcare Providers:  IncredibleEmployment.be  This test is no t yet approved or cleared by the [place] and  has been authorized  for detection and/or diagnosis of SARS-CoV-2 by FDA under an Emergency Use Authorization (EUA). This EUA will remain  in effect (meaning this test can be used) for the duration of the COVID-19 declaration under Section 564(b)(1) of the Act, 21 U.S.C.section 360bbb-3(b)(1), unless the authorization is terminated  or revoked sooner.       Influenza A by PCR NEGATIVE NEGATIVE Final   Influenza B by PCR NEGATIVE NEGATIVE Final    Comment: (NOTE) The Xpert Xpress SARS-CoV-2/FLU/RSV plus assay is intended as an aid in the diagnosis of influenza from Nasopharyngeal swab specimens and should not be used as a sole basis for treatment. Nasal washings and aspirates are unacceptable for Xpert Xpress SARS-CoV-2/FLU/RSV testing.  Fact Sheet for Patients: EntrepreneurPulse.com.au  Fact Sheet for Healthcare Providers: IncredibleEmployment.be  This test is not yet approved or cleared by the [place] FDA and has been authorized for detection and/or diagnosis of SARS-CoV-2 by FDA under an Emergency Use Authorization (EUA). This EUA will remain in effect (meaning this test can be used) for the duration of the COVID-19 declaration under Section 564(b)(1) of the Act, 21 U.S.C. section 360bbb-3(b)(1), unless the authorization is terminated or revoked.  Performed at [facility], [address] [place], [place] [address]      Radiological Exams on Admission: MR Brain Wo Contrast (neuro protocol)  Result Date: 07/25/2021 CLINICAL DATA:  Initial evaluation for neuro deficit, stroke suspected. EXAM: MRI HEAD WITHOUT CONTRAST TECHNIQUE: Multiplanar, multiecho pulse sequences of the brain and surrounding structures were obtained without intravenous contrast. COMPARISON:  Prior CT from earlier the same day. FINDINGS: Brain: Cerebral volume within normal limits. No significant cerebral white matter disease for age. No evidence for acute or subacute infarct.  Gray-white matter differentiation maintained. No areas of chronic cortical infarction. No acute intracranial hemorrhage. Single punctate chronic microhemorrhage noted at the medulla, of doubtful significance in isolation. No mass lesion, midline shift or mass effect. No hydrocephalus or extra-axial fluid collection. Pituitary gland suprasellar region normal. Midline structures intact. Vascular: Major intracranial vascular flow voids are maintained. Skull and upper cervical spine: Craniocervical junction within normal limits. Bone marrow signal intensity normal. No scalp soft tissue abnormality. Sinuses/Orbits: Globes orbital soft tissues within normal limits. Left maxillary sinusitis noted. Mild mucosal thickening noted within the ethmoidal air cells and right maxillary sinus. Small left mastoid effusion noted. Other: None. IMPRESSION: 1. Normal brain MRI for age. No acute intracranial abnormality identified. 2. Left maxillary sinusitis. Electronically Signed   By: Jeannine Boga M.D.   On: 07/25/2021 00:57   CT Angio Chest/Abd/Pel for Dissection W and/or W/WO  Result Date: 07/24/2021 CLINICAL DATA:  Mid chest pain radiating down both legs EXAM: CT ANGIOGRAPHY CHEST, ABDOMEN AND PELVIS TECHNIQUE: Non-contrast CT of the chest was initially obtained. Multidetector CT imaging through the chest, abdomen and pelvis was performed using the standard protocol during bolus administration of intravenous contrast. Multiplanar reconstructed images and MIPs were obtained and reviewed to evaluate the vascular anatomy. RADIATION DOSE REDUCTION: This exam was performed according to the departmental dose-optimization program which includes automated exposure control, adjustment of the mA and/or kV according to patient size and/or use of iterative reconstruction technique. CONTRAST:  74mL OMNIPAQUE IOHEXOL 350 MG/ML SOLN COMPARISON:  02/17/2021 FINDINGS: CTA CHEST FINDINGS Cardiovascular: 4 cm ascending thoracic aortic  aneurysm. No evidence of dissection. Mild atherosclerosis  of the aortic arch. The heart is unremarkable without pericardial effusion. 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 While not optimized for opacification of the pulmonary vasculature, there is sufficient contrast enhancement to exclude central pulmonary emboli. Mediastinum/Nodes: No enlarged mediastinal, hilar, or axillary lymph nodes. Thyroid gland, trachea, and esophagus demonstrate no significant findings. Lungs/Pleura: No acute airspace disease, effusion, or pneumothorax. Central airways are patent. Musculoskeletal: No acute or destructive bony lesions. Reconstructed images demonstrate no additional findings. Review of the MIP images confirms the above findings. CTA ABDOMEN AND PELVIS FINDINGS VASCULAR Aorta: Normal caliber aorta without aneurysm, dissection, vasculitis or significant stenosis. Scattered atherosclerosis. Celiac: Patent without evidence of aneurysm, dissection, vasculitis or significant stenosis. 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 Atherosclerosis at the origin of the common hepatic and celiac arteries without flow limiting stenosis. SMA: Patent without evidence of aneurysm, dissection, vasculitis or significant stenosis. Atherosclerosis at the origin of the SMA without flow limiting stenosis. Renals: Both renal arteries are patent without evidence of aneurysm, dissection, vasculitis, fibromuscular dysplasia or significant stenosis. Stable mild atherosclerosis. IMA: Patent without evidence of aneurysm, dissection, vasculitis or significant stenosis. Inflow: Patent right common iliac artery stent unchanged. Stable atherosclerosis without flow limiting stenosis, aneurysm, or dissection. Veins: No obvious venous abnormality within the limitations of this arterial phase study. Review of the MIP images confirms the above findings. NON-VASCULAR Hepatobiliary: No focal liver abnormality is  seen. No gallstones, gallbladder wall thickening, or biliary dilatation. Pancreas: Incidental pancreatic divisum. Normal enhancement of the pancreatic parenchyma. No inflammatory changes or pancreatic duct dilation. Spleen: Normal in size without focal abnormality. Adrenals/Urinary Tract: Adrenal glands are unremarkable. Kidneys are normal, without renal calculi, focal lesion, or hydronephrosis. Bladder is unremarkable. Stomach/Bowel: No bowel obstruction or ileus. Normal appendix right lower quadrant. No bowel wall thickening or inflammatory change. Lymphatic: No pathologic adenopathy within the abdomen or pelvis. Reproductive: Prostate is unremarkable. Other: No free fluid or free intraperitoneal gas. No abdominal wall hernia. Musculoskeletal: No acute or destructive bony lesions. Reconstructed images demonstrate no additional findings. Review of the MIP images confirms the above findings. IMPRESSION: 1. 4 cm ascending thoracic aortic aneurysm. Recommend annual imaging followup by CTA or MRA. This recommendation follows 2010 ACCF/AHA/AATS/ACR/ASA/SCA/SCAI/SIR/STS/SVM Guidelines for the Diagnosis and Management of Patients with Thoracic Aortic Disease. Circulation. 2010; 121: N867-E720. Aortic aneurysm NOS (ICD10-I71.9) 2. No evidence of aortic dissection. 3. No acute intrathoracic, intra-abdominal, or intrapelvic process. 4.  Aortic Atherosclerosis (ICD10-I70.0). Electronically Signed   By: Randa Ngo M.D.   On: 07/24/2021 21:04   CT HEAD CODE STROKE WO CONTRAST  Result Date: 07/24/2021 CLINICAL DATA:  Code stroke.  Acute neuro deficit. EXAM: CT HEAD WITHOUT CONTRAST TECHNIQUE: Contiguous axial images were obtained from the base of the skull through the vertex without intravenous contrast. RADIATION DOSE REDUCTION: This exam was performed according to the departmental dose-optimization program which includes automated exposure control, adjustment of the mA and/or kV according to patient size and/or use of  iterative reconstruction technique. COMPARISON:  CT head 09/17/2020 FINDINGS: Brain: No evidence of acute infarction, hemorrhage, hydrocephalus, extra-axial collection or mass lesion/mass effect. Vascular: Symmetric hyperintensity in the middle cerebral arteries bilaterally and in the basilar. No focal vascular hyperdensity. Skull: Negative Sinuses/Orbits: Mild mucosal edema paranasal sinuses. Negative orbit Other: None ASPECTS ([place] Stroke Program Early CT Score) - Ganglionic level infarction (caudate, lentiform nuclei, internal capsule, insula, M1-M3 cortex): 7 - Supraganglionic infarction (M4-M6 cortex): 3 Total score (0-10 with 10 being normal): 10  IMPRESSION: 1. No acute intracranial abnormality. 2. ASPECTS is 10 3. Code stroke imaging results were communicated on 07/24/2021 at 7:52 pm to provider Bryan Scott via text page Electronically Signed   By: Franchot Gallo M.D.   On: 07/24/2021 19:53   CT ANGIO HEAD NECK W WO CM W PERF (CODE STROKE)  Result Date: 07/24/2021 CLINICAL DATA:  Initial evaluation for neuro deficit, stroke suspected. EXAM: CT ANGIOGRAPHY HEAD AND NECK CT PERFUSION BRAIN TECHNIQUE: Multidetector CT imaging of the head and neck was performed using the standard protocol during bolus administration of intravenous contrast. Multiplanar CT image reconstructions and MIPs were obtained to evaluate the vascular anatomy. Carotid stenosis measurements (when applicable) are obtained utilizing NASCET criteria, using the distal internal carotid diameter as the denominator. Multiphase CT imaging of the brain was performed following IV bolus contrast injection. Subsequent parametric perfusion maps were calculated using RAPID software. RADIATION DOSE REDUCTION: This exam was performed according to the departmental dose-optimization program which includes automated exposure control, adjustment of the mA and/or kV according to patient size and/or use of iterative reconstruction technique. CONTRAST:  87mL  OMNIPAQUE IOHEXOL 350 MG/ML SOLN COMPARISON:  Prior head CT from earlier the same day. FINDINGS: CTA NECK FINDINGS Aortic arch: Visualized aortic arch normal in caliber with normal 3 vessel morphology. Mild-to-moderate atheromatous change about the arch and origin of the great vessels without significant stenosis. Right carotid system: Right CCA widely patent proximally. Vascular stent spans the distal right CCA/right bifurcation. Patent flow through the stent. Right ICA patent distally without stenosis or dissection. Left carotid system: Left CCA patent proximally. Vascular stent spans the distal left CCA/left bifurcation. Patent flow through the stent. Left ICA patent distally without stenosis or dissection. Vertebral arteries: Both vertebral arteries arise from the subclavian arteries. Vertebral arteries patent without stenosis or dissection. Skeleton: No discrete or worrisome osseous lesions. Patient is edentulous. Prior fusion at C5-C7, with posterior decompression at C3 through C5. Other neck: No other acute soft tissue abnormality within the neck. Upper chest: There is a 2.5 cm pleural based irregular soft tissue density along the medial aspect of the left upper lobe, immediately adjacent to the aortic arch (series 9, image 7), indeterminate. Visualized upper chest demonstrates no other acute finding. Review of the MIP images confirms the above findings CTA HEAD FINDINGS Anterior circulation: Petrous segments patent bilaterally. Mild atheromatous change within the carotid siphons without significant stenosis. A1 segments patent bilaterally. Right A1 hypoplastic. Normal anterior communicating artery complex. Anterior cerebral arteries patent without stenosis. No M1 stenosis or occlusion. Normal MCA bifurcations. Distal MCA branches perfused and symmetric. Posterior circulation: Both V4 segments patent without stenosis. Left PICA patent. Right PICA not seen. Basilar patent to its distal aspect without stenosis.  Superior cerebral arteries patent bilaterally. Left PCA supplied via the basilar. Fetal type origin right PCA. Both PCAs patent to their distal aspects. Venous sinuses: Patent allowing for timing the contrast bolus. Anatomic variants: Fetal type origin of the right PCA.  No aneurysm. Review of the MIP images confirms the above findings CT Brain Perfusion Findings: ASPECTS: 10. CBF (<30%) Volume: 8mL Perfusion (Tmax>6.0s) volume: 30mL Mismatch Volume: 24mL Infarction Location:Negative CT perfusion with no evidence for acute ischemia or other perfusion deficit. IMPRESSION: 1. Negative CTA for emergent large vessel occlusion. 2. Negative CT perfusion with no evidence for acute ischemia or other perfusion deficit. 3. Bilateral carotid artery stents in place with patent flow. 4. Mild for age atheromatous change without hemodynamically significant or correctable stenosis. 5. 2.5  cm pleural based irregular soft tissue density along the medial aspect of the left upper lobe, indeterminate. While this finding could reflect an area of focal scarring and/or atelectasis, a possible pleural base nodule is not excluded. Consider one of the following in 3 months for both low-risk and high-risk individuals: (a) repeat chest CT, (b) follow-up PET-CT, or (c) tissue sampling. This recommendation follows the consensus statement: Guidelines for Management of Incidental Pulmonary Nodules Detected on CT Images: From the Fleischner Society 2017; Radiology 2017; 284:228-243. Electronically Signed   By: Jeannine Boga M.D.   On: 07/24/2021 22:21      Assessment/Plan Principal Problem:   TIA (transient ischemic attack) Active Problems:   Chest pain   DM (diabetes mellitus), type 2 with peripheral vascular complications (HCC)   Hyperlipidemia   Essential hypertension   Carotid stenosis   PAF (paroxysmal atrial fibrillation) (HCC)   CAD (coronary artery disease)   AKI (acute kidney injury) ([place])   Chronic diastolic CHF  (congestive heart failure) (HCC)   Ascending aortic aneurysm  TIA (transient ischemic attack): MRI of the brain is negative for stroke, possibly due to TIA.  Dr. Rory Scott of neurology is consulted.  -Placed on MedSurg bed for observation -on Eliquis -Lipitor, Zetia, plavix - fasting lipid panel and HbA1c  - swallowing screen. If fails, will get SLP - Check UDS   Chest pain and hx of CAD: s/p of CABG and DES. Trop negative x 3.  CT angiogram is negative for dissection.  Consulted Dr. Saralyn Pilar of cardiology - Check A1c, FLP - Trend Trop - prn Nitroglycerin, Morphine -Lipitor, Zetia, Ranexa - Risk factor stratification: will check FLP and A1C  - check UDS  DM (diabetes mellitus), type 2 with peripheral vascular complications [facility]): Recent A1c 6.8, well controlled.  Patient taking metformin and Jardiance at home -Sliding scale insulin  Hyperlipidemia -Lipitor, Zetia  Essential hypertension -Coreg -IV hydralazine as needed  Carotid stenosis -Home Plavix, Lipitor  PAF (paroxysmal atrial fibrillation) (HCC) -Eliquis -Coreg  AKI (acute kidney injury) ([place]): Mild, creatinine 1.26, BUN 20, GFR 59 -Follow-up by BMP -Hold spironolactone  Chronic diastolic CHF (congestive heart failure) ([place]): 2D echo on 12/05/2020 showed EF 70 to 75% with grade 1 diastolic dysfunction.  Patient does not have leg edema or DVT.  CHF is compensated. -Hold the spironolactone  Ascending aortic aneurysm -Follow-up with PCP       DVT ppx: on Eliquis  Code Status: Full code  Family Communication: not done, no family member is at bed side.     Disposition Plan:  Anticipate discharge back to previous environment  Consults called: Dr. Rory Scott of neuro and Dr. Saralyn Pilar of card  Admission status and Level of care: Telemetry Medical:   obs     Severity of Illness:  The appropriate patient status for this patient is OBSERVATION. Observation status is judged to be reasonable and necessary in  order to provide the required intensity of service to ensure the patient's safety. The patient's presenting symptoms, physical exam findings, and initial radiographic and laboratory data in the context of their medical condition is felt to place them at decreased risk for further clinical deterioration. Furthermore, it is anticipated that the patient will be medically stable for discharge from the hospital within 2 midnights of admission.        Date of Service 07/25/2021    Ivor Costa [facility]   If 7PM-7AM, please contact night-coverage [email or web address] 07/25/2021, 7:26 PM

## 2021-07-25 NOTE — Progress Notes (Signed)
°   07/25/21 1400  Clinical Encounter Type  Visited With Patient  Visit Type Follow-up  Referral From Social work   Chaplain received [place] indicating patient wanted to complete AD. However, upon inquiry, Patient indicated that he had one completed.

## 2021-07-25 NOTE — ED Provider Notes (Signed)
----------------------------------------- °  12:58 AM on 07/25/2021 -----------------------------------------   Awaiting results of MRI brain.  IV morphine given for left chest and arm pain.  Elevated blood pressure noted.  If morphine does not lower patient's blood pressure, will consider IV antihypertensive.   ----------------------------------------- 1:06 AM on 07/25/2021 -----------------------------------------   MRI brain negative.  However, patient complaining of chest pain, L UE pain and very hypertensive.  Will initiate antihypertensive and consult hospitalist services for valuation and admission.   ----------------------------------------- 1:16 AM on 07/25/2021 -----------------------------------------   BP 155/70s after IV Morphine. Will apply 1/2" ntg paste.   Paulette Blanch, MD 07/25/21 [phone]

## 2021-07-25 NOTE — Progress Notes (Addendum)
SLP Cancellation Note  Patient Details Name: Bryan Scott MRN: [id number] DOB: [birth year]   Cancelled treatment:       Reason Eval/Treat Not Completed: SLP screened, no needs identified, will sign off (chart reviewed; consulted NSG then met w/ pt).  Pt denied any difficulty swallowing and is currently on a regular diet; tolerates swallowing pills w/ water per NSG. He had just finished w/ his Dinner meal eating 100% of plate/liquids, including most of the salad. Pt conversed in conversation w/out overt expressive/receptive deficits noted; pt denied any current speech-language deficits. Speech clear, vocal quality min gravely, but pt was lying on back down in the bed. No further skilled ST services indicated as pt appears at his baseline. Pt agreed. NSG to reconsult if any change in status while admitted.    OF NOTE: pt endorsed that he has talked w/ his MD(through [facility] chart notes) about living in a facility d/t decline in his health.  [facility] Chart Note in 04/2021: per MD, "He lives by himself and discussed with his PCP about moving to a facility as he finds that he cannot take care of himself no longer. He does have lightheadedness and dizziness with activity and within the last year had 2 syncopal episodes where he fell.". Also noted pt reported weight loss.  When asked, pt stated it was hard to "do all of those things" referring to cooking, cleaning/laundry. He lives alone and still drives. Encouraged him to talk w/ Hospitalist and SW/CM further this admit.       Orinda Kenner, MS, CCC-SLP Speech Language Pathologist Rehab Services; [place] [phone] (ascom) Benny Henrie 07/25/2021, 5:39 PM

## 2021-07-25 NOTE — Progress Notes (Signed)
Lab notified of pending STAT troponin collection.

## 2021-07-25 NOTE — Progress Notes (Signed)
Nutrition Brief Note  Patient identified on the Malnutrition Screening Tool (MST) Report  Wt Readings from Last 15 Encounters:  07/24/21 84.7 kg  04/10/21 71.7 kg  02/16/21 88.6 kg  10/10/20 77.1 kg  10/09/20 77.2 kg  10/05/20 77 kg  10/03/20 77 kg  09/26/20 80.4 kg  09/25/20 79.7 kg  09/06/20 81.8 kg  08/09/20 82.6 kg  07/21/20 81.3 kg  12/13/19 86 kg  09/08/19 89.7 kg  07/16/19 89.7 kg   76/M with extensive cardiac history and prior history of strokes per family member (contacted [employer] on phone listed on chart - sister, does not live with him), Afib on Eliquis last dose this morning, CAD, b/l ICA stenoses, DM, HTN, HLD - came from home via EMS for sudden onset of slurred speech, confusion and left sided deficits. Started with chest pain and left sided weakkness. Symptoms started at [place] around lunch time at noon. Preactivated by EMS as they were given 4pm LKW but indeed it is 12pm- quick exam at the bridge with dysarthria and left hemiparesis along with ?left facial droop.  Pt admitted with lt sided weakness.   Reviewed I/O's: -234 ml x 24 hours  UOP: 475 ml x 24 hours  Labs reviewed: CBGS: 231.   Current diet order is heart healthy/ carb modified, patient is consuming approximately 100% of meals at this time. Labs and medications reviewed.   No nutrition interventions warranted at this time. If nutrition issues arise, please consult RD.   Loistine Chance, RD, LDN, [place] Registered Dietitian II Certified Diabetes Care and Education Specialist Please refer to [facility] for RD and/or RD on-call/weekend/after hours pager

## 2021-07-25 NOTE — Consult Note (Signed)
CARDIOLOGY CONSULT NOTE               Patient ID: TYMIR TERRAL MRN: [id number] DOB/AGE: 09-20-1945 76 y.o.  Admit date: 07/24/2021 Referring Physician Blaine Hamper Primary Physician [facility] Primary [facility] Reason for Consultation chest pain  HPI: 76 year old gentleman referred for evaluation of chest pain.  The patient has a history of coronary artery disease status post CABG x4 in 2006 and multiple PCI's, on Plavix, type 2 diabetes, carotid artery stenosis, chronic HFpEF, hypertension, atrial fibrillation on Xarelto previous 30-pack-year smoking history.  Most recent cardiac catheterization on 04/18/2021 [facility] revealed three-vessel disease with 2 out of 4 patent grafts, with no changes from prior cardiac catheterization in 06/2020.  2D echocardiogram on 04/18/2021 revealed normal left ventricular function with LVEF greater than 55%.  Most recent stent placement was 07/13/2020 with successful PCI to SVG-RCA.  The patient reports experiencing chest pressure with radiation to his left arm yesterday around noon at rest.  Around 4 PM, he began experiencing left arm and leg weakness and numbness, blurry vision, slurred speech, left facial droop.  He continued to experience this chest discomfort and extremity weakness, went to church, and while standing up, felt left leg numbness and subsequently fell to the ground without presyncope or syncope.  EMS was called and transported patient to [facility] ER.  Head CT revealed no acute intracranial abnormality.  The patient received IV morphine for chest pain. High-sensitivity troponin was normal at 8-9-7. Brain MRI negative for stroke. Initial ECG revealed sinus rhythm with Q in II, III aVF with minimal ST elevation in V5, V6 without reciprocal changes (reviewed with Dr. Saralyn Pilar). At this time, the patient continues to have upper and lower extremity numbness and 8/10 chest pain described as pressure with slight shortness of breath without nausea or diaphoresis.    Review of systems complete and found to be negative unless listed above     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    Angioedema   Back injuries    Bladder cancer ([place])    Cancer (HCC)    Carotid artery disease (HCC)    > 75% bilateral ICA stenoses on 01/2013 CT   Chronic pain syndrome    Coronary artery disease    a. 2007 s/p CABG; b. 06/2020 PCI: LM 40ost, LAD nl, LCX 100p, RCA 161m - fills via L->R collats. VG->RPDA 18m/d (3.5x18 Resolute Onyx DES), VG->Diag 100, VG->OM2 ok.   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 08/2020 Echo: EF 70-75%, no rwma, Gr1 DD, nl RV fxn, triv MR, mild-mod Ao sclerosis.   Paroxysmal atrial fibrillation (HCC)    On Xarelto anticoagulation   Type 2 diabetes mellitus ([place])    A1C 6.8% in 01/2013    Past Surgical History:  Procedure Laterality Date   CARDIAC CATHETERIZATION     CORONARY ARTERY BYPASS GRAFT  2007   CORONARY STENT INTERVENTION N/A 07/13/2020   Procedure: CORONARY STENT INTERVENTION;  Surgeon: Nelva Bush, MD;  Location: [facility];  Service: Cardiovascular;  Laterality: N/A;   ESOPHAGOGASTRODUODENOSCOPY (EGD) WITH PROPOFOL N/A 09/20/2020   Procedure: ESOPHAGOGASTRODUODENOSCOPY (EGD) WITH PROPOFOL;  Surgeon: Ladene Artist, MD;  Location: [facility] ENDOSCOPY;  Service: Endoscopy;  Laterality:  N/A;   LEFT HEART CATH AND CORS/GRAFTS ANGIOGRAPHY N/A 07/13/2020   Procedure: LEFT HEART CATH AND CORS/GRAFTS ANGIOGRAPHY;  Surgeon: Minna Merritts, MD;  Location: [place];  Service: Cardiovascular;  Laterality: N/A;    Medications Prior to Admission  Medication Sig Dispense Refill Last Dose   acetaminophen (TYLENOL) 325 MG tablet Take 2 tablets (650 mg total) by mouth every 6 (six) hours. (Patient taking  differently: Take 500-2,000 mg by mouth every 6 (six) hours.)      atorvastatin (LIPITOR) 80 MG tablet Take 1 tablet (80 mg total) by mouth daily. 30 tablet 0    carvedilol (COREG) 6.25 MG tablet Take 1 tablet (6.25 mg total) by mouth 2 (two) times daily with a meal. 60 tablet 0    clopidogrel (PLAVIX) 75 MG tablet Take 1 tablet (75 mg total) by mouth every morning. 30 tablet 0    DULoxetine (CYMBALTA) 30 MG capsule Take 1 capsule (30 mg total) by mouth daily. 30 capsule 0    empagliflozin (JARDIANCE) 25 MG TABS tablet Take 1 tablet (25 mg total) by mouth daily. 30 tablet 0    ezetimibe (ZETIA) 10 MG tablet Take 1 tablet (10 mg total) by mouth daily. 30 tablet 0    gabapentin (NEURONTIN) 100 MG capsule Take 1 capsule (100 mg total) by mouth 2 (two) times daily. (Patient taking differently: Take 100 mg by mouth 3 (three) times daily.) 60 capsule 0    isosorbide mononitrate (IMDUR) 60 MG 24 hr tablet Take 0.5 tablets (30 mg total) by mouth daily. 30 tablet 0    melatonin 5 MG TABS Take 1 tablet (5 mg total) by mouth at bedtime. 30 tablet 0    metFORMIN (GLUCOPHAGE) 500 MG tablet Take 1 tablet (500 mg total) by mouth 2 (two) times daily with a meal. (Patient taking differently: Take 500 mg by mouth daily with breakfast.) 60 tablet 0    Multiple Vitamin (MULTIVITAMIN WITH MINERALS) TABS tablet Take 1 tablet by mouth daily. 30 tablet 0    neomycin-bacitracin-polymyxin (NEOSPORIN) ointment Apply 1 application topically as needed for wound care (cuts and scrapes).      nitroGLYCERIN (NITROSTAT) 0.4 MG SL tablet Place 1 tablet (0.4 mg total) under the tongue every 5 (five) minutes as needed for chest pain. 30 tablet 0    Nutritional Supplements (FEEDING SUPPLEMENT, GLUCERNA 1.2 CAL,) LIQD Take 237 mLs by mouth daily in the afternoon.      ondansetron (ZOFRAN) 4 MG tablet Take 1 tablet (4 mg total) by mouth daily as needed for nausea or vomiting. 20 tablet 0    oxymetazoline (AFRIN) 0.05 % nasal spray Place 2  sprays into both nostrils 2 (two) times daily as needed for congestion.      pantoprazole (PROTONIX) 40 MG tablet Take 1 tablet (40 mg total) by mouth daily. (Patient taking differently: Take 40 mg by mouth in the morning, at noon, in the evening, and at bedtime.) 30 tablet 0    polyethylene glycol (MIRALAX / GLYCOLAX) 17 g packet Take 17 g by mouth daily. 14 each 0    ranolazine (RANEXA) 500 MG 12 hr tablet Take 1 tablet (500 mg total) by mouth 2 (two) times daily. 60 tablet 0    rivaroxaban (XARELTO) 20 MG TABS tablet Take 1 tablet (20 mg total) by mouth daily with supper. 30 tablet 0    senna-docusate (SENOKOT-S) 8.6-50 MG tablet Take 2 tablets by mouth 2 (two) times daily. 100 tablet 0  spironolactone (ALDACTONE) 25 MG tablet Take 1 tablet (25 mg total) by mouth daily. 30 tablet 0    sucralfate (CARAFATE) 1 g tablet Take 1 tablet (1 g total) by mouth 4 (four) times daily -  with meals and at bedtime. 120 tablet 0    Tetrahydrozoline HCl (VISINE OP) Place 1 drop into both eyes daily as needed (dry red eyes).      Social History   Socioeconomic History   Marital status: Single    Spouse name: Not on file   Number of children: Not on file   Years of education: Not on file   Highest education level: Not on file  Occupational History   Occupation: retired  Tobacco Use   Smoking status: Former    Packs/day: 1.00    Years: 30.00    Pack years: 30.00    Types: Cigarettes   Smokeless tobacco: Never   Tobacco comments:    30 pack-year history. Quit 8-9 years ago.   Vaping Use   Vaping Use: Never used  Substance and Sexual Activity   Alcohol use: Not Currently   Drug use: Never   Sexual activity: Not on file  Other Topics Concern   Not on file  Social History Narrative   ** Merged History Encounter **       Social Determinants of Health   Financial Resource Strain: Not on file  Food Insecurity: Not on file  Transportation Needs: Not on file  Physical Activity: Not on file   Stress: Not on file  Social Connections: Not on file  Intimate Partner Violence: Not on file    Family History  Problem Relation Age of Onset   Heart disease Mother 23       Deceased   CVA Mother    Seizures Father 14       Deceased from complications with seizures      Review of systems complete and found to be negative unless listed above      PHYSICAL EXAM  General: Well developed, well nourished, lying in bed, in no acute distress HEENT:  Normocephalic and atramatic Neck:  No JVD.  Lungs: Clear bilaterally to auscultation, normal WOB on RA Heart: HRRR . Normal S1 and S2 without gallops or murmurs.  Abdomen: nondistended Msk:  no obvious abnormalities Extremities: No clubbing, cyanosis or edema.   Neuro: Alert and oriented X 3. Psych:  Good affect, responds appropriately  Labs:   Lab Results  Component Value Date   WBC 7.2 07/24/2021   HGB 15.0 07/24/2021   HCT 44.1 07/24/2021   MCV 96.5 07/24/2021   PLT 160 07/24/2021    Recent Labs  Lab 07/24/21 2048 07/25/21 0825  NA 131* 135  K 3.6 3.7  CL 101 104  CO2 23 23  BUN 20 16  CREATININE 1.26* 1.00  CALCIUM 8.8* 9.0  PROT 6.4*  --   BILITOT 1.2  --   ALKPHOS 77  --   ALT 21  --   AST 18  --   GLUCOSE 150* 177*   Lab Results  Component Value Date   CKTOTAL 59 09/18/2020   CKMB 4.6 (H) 10/22/2013   TROPONINI <0.03 09/26/2016    Lab Results  Component Value Date   CHOL 108 02/17/2021   CHOL 95 08/08/2020   CHOL 232 (H) 09/10/2019   Lab Results  Component Value Date   HDL 52 02/17/2021   HDL 42 08/08/2020   HDL 32 (L) 09/10/2019  Lab Results  Component Value Date   LDLCALC 26 02/17/2021   LDLCALC 29 08/08/2020   LDLCALC 165 (H) 09/10/2019   Lab Results  Component Value Date   TRIG 149 02/17/2021   TRIG 121 08/08/2020   TRIG 173 (H) 09/10/2019   Lab Results  Component Value Date   CHOLHDL 2.1 02/17/2021   CHOLHDL 2.3 08/08/2020   CHOLHDL 7.3 09/10/2019   No results found  for: LDLDIRECT    Radiology: MR Brain Wo Contrast (neuro protocol)  Result Date: 07/25/2021 CLINICAL DATA:  Initial evaluation for neuro deficit, stroke suspected. EXAM: MRI HEAD WITHOUT CONTRAST TECHNIQUE: Multiplanar, multiecho pulse sequences of the brain and surrounding structures were obtained without intravenous contrast. COMPARISON:  Prior CT from earlier the same day. FINDINGS: Brain: Cerebral volume within normal limits. No significant cerebral white matter disease for age. No evidence for acute or subacute infarct. Gray-white matter differentiation maintained. No areas of chronic cortical infarction. No acute intracranial hemorrhage. Single punctate chronic microhemorrhage noted at the medulla, of doubtful significance in isolation. No mass lesion, midline shift or mass effect. No hydrocephalus or extra-axial fluid collection. Pituitary gland suprasellar region normal. Midline structures intact. Vascular: Major intracranial vascular flow voids are maintained. Skull and upper cervical spine: Craniocervical junction within normal limits. Bone marrow signal intensity normal. No scalp soft tissue abnormality. Sinuses/Orbits: Globes orbital soft tissues within normal limits. Left maxillary sinusitis noted. Mild mucosal thickening noted within the ethmoidal air cells and right maxillary sinus. Small left mastoid effusion noted. Other: None. IMPRESSION: 1. Normal brain MRI for age. No acute intracranial abnormality identified. 2. Left maxillary sinusitis. Electronically Signed   By: Jeannine Boga M.D.   On: 07/25/2021 00:57   CT Angio Chest/Abd/Pel for Dissection W and/or W/WO  Result Date: 07/24/2021 CLINICAL DATA:  Mid chest pain radiating down both legs EXAM: CT ANGIOGRAPHY CHEST, ABDOMEN AND PELVIS TECHNIQUE: Non-contrast CT of the chest was initially obtained. Multidetector CT imaging through the chest, abdomen and pelvis was performed using the standard protocol during bolus administration of  intravenous contrast. Multiplanar reconstructed images and MIPs were obtained and reviewed to evaluate the vascular anatomy. RADIATION DOSE REDUCTION: This exam was performed according to the departmental dose-optimization program which includes automated exposure control, adjustment of the mA and/or kV according to patient size and/or use of iterative reconstruction technique. CONTRAST:  64mL OMNIPAQUE IOHEXOL 350 MG/ML SOLN COMPARISON:  02/17/2021 FINDINGS: CTA CHEST FINDINGS Cardiovascular: 4 cm ascending thoracic aortic aneurysm. No evidence of dissection. Mild atherosclerosis of the aortic arch. The heart is unremarkable without pericardial effusion. 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 While not optimized for opacification of the pulmonary vasculature, there is sufficient contrast enhancement to exclude central pulmonary emboli. Mediastinum/Nodes: No enlarged mediastinal, hilar, or axillary lymph nodes. Thyroid gland, trachea, and esophagus demonstrate no significant findings. Lungs/Pleura: No acute airspace disease, effusion, or pneumothorax. Central airways are patent. Musculoskeletal: No acute or destructive bony lesions. Reconstructed images demonstrate no additional findings. Review of the MIP images confirms the above findings. CTA ABDOMEN AND PELVIS FINDINGS VASCULAR Aorta: Normal caliber aorta without aneurysm, dissection, vasculitis or significant stenosis. Scattered atherosclerosis. Celiac: Patent without evidence of aneurysm, dissection, vasculitis or significant stenosis. 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 Atherosclerosis at the origin of the common hepatic and celiac arteries without flow limiting stenosis. SMA: Patent without evidence of aneurysm, dissection, vasculitis or significant stenosis. Atherosclerosis at the origin of the SMA without flow limiting stenosis. Renals:  Both renal arteries are patent without evidence of aneurysm,  dissection, vasculitis, fibromuscular dysplasia or significant stenosis. Stable mild atherosclerosis. IMA: Patent without evidence of aneurysm, dissection, vasculitis or significant stenosis. Inflow: Patent right common iliac artery stent unchanged. Stable atherosclerosis without flow limiting stenosis, aneurysm, or dissection. Veins: No obvious venous abnormality within the limitations of this arterial phase study. Review of the MIP images confirms the above findings. NON-VASCULAR Hepatobiliary: No focal liver abnormality is seen. No gallstones, gallbladder wall thickening, or biliary dilatation. Pancreas: Incidental pancreatic divisum. Normal enhancement of the pancreatic parenchyma. No inflammatory changes or pancreatic duct dilation. Spleen: Normal in size without focal abnormality. Adrenals/Urinary Tract: Adrenal glands are unremarkable. Kidneys are normal, without renal calculi, focal lesion, or hydronephrosis. Bladder is unremarkable. Stomach/Bowel: No bowel obstruction or ileus. Normal appendix right lower quadrant. No bowel wall thickening or inflammatory change. Lymphatic: No pathologic adenopathy within the abdomen or pelvis. Reproductive: Prostate is unremarkable. Other: No free fluid or free intraperitoneal gas. No abdominal wall hernia. Musculoskeletal: No acute or destructive bony lesions. Reconstructed images demonstrate no additional findings. Review of the MIP images confirms the above findings. IMPRESSION: 1. 4 cm ascending thoracic aortic aneurysm. Recommend annual imaging followup by CTA or MRA. This recommendation follows 2010 ACCF/AHA/AATS/ACR/ASA/SCA/SCAI/SIR/STS/SVM Guidelines for the Diagnosis and Management of Patients with Thoracic Aortic Disease. Circulation. 2010; 121: T654-Y503. Aortic aneurysm NOS (ICD10-I71.9) 2. No evidence of aortic dissection. 3. No acute intrathoracic, intra-abdominal, or intrapelvic process. 4.  Aortic Atherosclerosis (ICD10-I70.0). Electronically Signed   By:  Randa Ngo M.D.   On: 07/24/2021 21:04   CT HEAD CODE STROKE WO CONTRAST  Result Date: 07/24/2021 CLINICAL DATA:  Code stroke.  Acute neuro deficit. EXAM: CT HEAD WITHOUT CONTRAST TECHNIQUE: Contiguous axial images were obtained from the base of the skull through the vertex without intravenous contrast. RADIATION DOSE REDUCTION: This exam was performed according to the departmental dose-optimization program which includes automated exposure control, adjustment of the mA and/or kV according to patient size and/or use of iterative reconstruction technique. COMPARISON:  CT head 09/17/2020 FINDINGS: Brain: No evidence of acute infarction, hemorrhage, hydrocephalus, extra-axial collection or mass lesion/mass effect. Vascular: Symmetric hyperintensity in the middle cerebral arteries bilaterally and in the basilar. No focal vascular hyperdensity. Skull: Negative Sinuses/Orbits: Mild mucosal edema paranasal sinuses. Negative orbit Other: None ASPECTS ([place] Stroke Program Early CT Score) - Ganglionic level infarction (caudate, lentiform nuclei, internal capsule, insula, M1-M3 cortex): 7 - Supraganglionic infarction (M4-M6 cortex): 3 Total score (0-10 with 10 being normal): 10 IMPRESSION: 1. No acute intracranial abnormality. 2. ASPECTS is 10 3. Code stroke imaging results were communicated on 07/24/2021 at 7:52 pm to provider Rory Percy via text page Electronically Signed   By: Franchot Gallo M.D.   On: 07/24/2021 19:53   CT ANGIO HEAD NECK W WO CM W PERF (CODE STROKE)  Result Date: 07/24/2021 CLINICAL DATA:  Initial evaluation for neuro deficit, stroke suspected. EXAM: CT ANGIOGRAPHY HEAD AND NECK CT PERFUSION BRAIN TECHNIQUE: Multidetector CT imaging of the head and neck was performed using the standard protocol during bolus administration of intravenous contrast. Multiplanar CT image reconstructions and MIPs were obtained to evaluate the vascular anatomy. Carotid stenosis measurements (when applicable) are obtained  utilizing NASCET criteria, using the distal internal carotid diameter as the denominator. Multiphase CT imaging of the brain was performed following IV bolus contrast injection. Subsequent parametric perfusion maps were calculated using RAPID software. RADIATION DOSE REDUCTION: This exam was performed according to the departmental dose-optimization program which includes automated  exposure control, adjustment of the mA and/or kV according to patient size and/or use of iterative reconstruction technique. CONTRAST:  49mL OMNIPAQUE IOHEXOL 350 MG/ML SOLN COMPARISON:  Prior head CT from earlier the same day. FINDINGS: CTA NECK FINDINGS Aortic arch: Visualized aortic arch normal in caliber with normal 3 vessel morphology. Mild-to-moderate atheromatous change about the arch and origin of the great vessels without significant stenosis. Right carotid system: Right CCA widely patent proximally. Vascular stent spans the distal right CCA/right bifurcation. Patent flow through the stent. Right ICA patent distally without stenosis or dissection. Left carotid system: Left CCA patent proximally. Vascular stent spans the distal left CCA/left bifurcation. Patent flow through the stent. Left ICA patent distally without stenosis or dissection. Vertebral arteries: Both vertebral arteries arise from the subclavian arteries. Vertebral arteries patent without stenosis or dissection. Skeleton: No discrete or worrisome osseous lesions. Patient is edentulous. Prior fusion at C5-C7, with posterior decompression at C3 through C5. Other neck: No other acute soft tissue abnormality within the neck. Upper chest: There is a 2.5 cm pleural based irregular soft tissue density along the medial aspect of the left upper lobe, immediately adjacent to the aortic arch (series 9, image 7), indeterminate. Visualized upper chest demonstrates no other acute finding. Review of the MIP images confirms the above findings CTA HEAD FINDINGS Anterior circulation:  Petrous segments patent bilaterally. Mild atheromatous change within the carotid siphons without significant stenosis. A1 segments patent bilaterally. Right A1 hypoplastic. Normal anterior communicating artery complex. Anterior cerebral arteries patent without stenosis. No M1 stenosis or occlusion. Normal MCA bifurcations. Distal MCA branches perfused and symmetric. Posterior circulation: Both V4 segments patent without stenosis. Left PICA patent. Right PICA not seen. Basilar patent to its distal aspect without stenosis. Superior cerebral arteries patent bilaterally. Left PCA supplied via the basilar. Fetal type origin right PCA. Both PCAs patent to their distal aspects. Venous sinuses: Patent allowing for timing the contrast bolus. Anatomic variants: Fetal type origin of the right PCA.  No aneurysm. Review of the MIP images confirms the above findings CT Brain Perfusion Findings: ASPECTS: 10. CBF (<30%) Volume: 65mL Perfusion (Tmax>6.0s) volume: 40mL Mismatch Volume: 67mL Infarction Location:Negative CT perfusion with no evidence for acute ischemia or other perfusion deficit. IMPRESSION: 1. Negative CTA for emergent large vessel occlusion. 2. Negative CT perfusion with no evidence for acute ischemia or other perfusion deficit. 3. Bilateral carotid artery stents in place with patent flow. 4. Mild for age atheromatous change without hemodynamically significant or correctable stenosis. 5. 2.5 cm pleural based irregular soft tissue density along the medial aspect of the left upper lobe, indeterminate. While this finding could reflect an area of focal scarring and/or atelectasis, a possible pleural base nodule is not excluded. Consider one of the following in 3 months for both low-risk and high-risk individuals: (a) repeat chest CT, (b) follow-up PET-CT, or (c) tissue sampling. This recommendation follows the consensus statement: Guidelines for Management of Incidental Pulmonary Nodules Detected on CT Images: From the  Fleischner Society 2017; Radiology 2017; 284:228-243. Electronically Signed   By: Jeannine Boga M.D.   On: 07/24/2021 22:21    EKG: sinus rhythm without dynamic changes  ASSESSMENT AND PLAN:  Chest pain, with history of CAD (CABG x 4 in 2006, multiple stents, most recent 04/2021 SVG-RCA; LHC unchanged from previous in 06/2020, 2/4 patent stents, diffuse 3VD); high sensitivity troponin normal x 3. ECG reveals sinus rhythm with Q waves II, III and aVF with minimal ST elevation in V5, V6 without reciprocal  changes.  TIA, brain MRI and CT negative for stroke Paroxysmal atrial fibrillation, on Xarelto Hypertension Carotid stenosis Chronic HFpEF, appears euvolemic  Plan: Continue Plavix and Eliquis Defer cardiac catheterization as last was in 04/2021 Repeat ECG now Increase isosorbide mononitrate to 60 mg daily; continue Ranexa 500 mg BID Continue high intensity statin, carvedilol  Signed: Clabe Seal PA-C 07/25/2021, 12:46 PM  Discussed with Dr. Saralyn Pilar. Plan made in collaboration with him.

## 2021-07-25 NOTE — Evaluation (Signed)
Occupational Therapy Evaluation Patient Details Name: Bryan Scott MRN: [id number] DOB: Nov 28, 1945 Today's Date: 07/25/2021   History of Present Illness 76 y/o male who came in with L sided weakness and chest pain.  history of of CAD, A-fib on Eliquis with last dose this morning, hypertension, diabetes, hyperlipidemia, and previous strokes who presents for sudden onset of slurred speech, confusion, and left-sided deficits.  Code stroke, imaging is negtive for acute changes.   Clinical Impression   Pt was seen for OT evaluation this date. Prior to hospital admission, pt was independent with ADL (although endorses increasingly difficult to bath, dress, manage medication, and prepare meals) and using a rollator for mobility. Pt lives by himself and denies any family/friends who can assist him. Currently pt demonstrates impairments in L side strength, coordination, sensation, and visual deficits (intermittent blurry vision, difficulty with accommodation, and decreased smoothness with visual tracking to the R) as described below (See OT problem list) which functionally limit his ability to perform ADL/self-care tasks. Pt currently requires supervision for bed mobility, able to don socks seated EOB without assist but demo's difficulty, MIN A-MOD A for ADL transfers (LLE buckling) and heavy reliance on RW for stability. Pt also notes chest pain radiating down LUE throughout, worsening with exertion. Medical team already aware and pt reports having recently taken medication for it. Pt reports being eager to get stronger and interested in rehab services to support his return to PLOF/independence. Pt would benefit from skilled OT services to address noted impairments and functional limitations (see below for any additional details) in order to maximize safety and independence while minimizing falls risk and caregiver burden. Upon hospital discharge, recommend SNF for short term rehab to maximize pt safety and return to  [facility].    Recommendations for follow up therapy are one component of a multi-disciplinary discharge planning process, led by the attending physician.  Recommendations may be updated based on patient status, additional functional criteria and insurance authorization.   Follow Up Recommendations  Skilled nursing-short term rehab (<3 hours/day)    Assistance Recommended at Discharge Intermittent Supervision/Assistance  Patient can return home with the following A lot of help with walking and/or transfers;A lot of help with bathing/dressing/bathroom;Assistance with cooking/housework;Assist for transportation;Help with stairs or ramp for entrance;Direct supervision/assist for medications management    Functional Status Assessment  Patient has had a recent decline in their functional status and demonstrates the ability to make significant improvements in function in a reasonable and predictable amount of time.  Equipment Recommendations  BSC/3in1    Recommendations for Other Services       Precautions / Restrictions Precautions Precautions: Fall Restrictions Weight Bearing Restrictions: No      Mobility Bed Mobility Overal bed mobility: Modified Independent                  Transfers Overall transfer level: Needs assistance Equipment used: Rolling walker (2 wheels) Transfers: Sit to/from Stand Sit to Stand: Mod assist, Min assist           General transfer comment: difficulty completing, LLE buckles on 1 attempt, improved with 2nd attempt, heavy BUE support; took small shuffled lateral steps EOB with MIN A      Balance Overall balance assessment: Needs assistance Sitting-balance support: No upper extremity supported Sitting balance-Leahy Scale: Good     Standing balance support: Bilateral upper extremity supported, Reliant on assistive device for balance Standing balance-Leahy Scale: Fair  ADL either performed or assessed  with clinical judgement   ADL Overall ADL's : Needs assistance/impaired                                       General ADL Comments: Pt donned socks seated EOB with set up and supervision, requires MOD A for LBd to complete in standing, MIN-MOD A for ADL transfers, significant difficulty with any grooming tasks involving LUE 2/2 decr coordination     Vision Baseline Vision/History: 0 No visual deficits Ability to See in Adequate Light: 0 Adequate Patient Visual Report: Blurring of vision Vision Assessment?: Yes Eye Alignment: Within Functional Limits Ocular Range of Motion: Within Functional Limits Alignment/Gaze Preference: Within Defined Limits Tracking/Visual Pursuits: Decreased smoothness of horizontal tracking;Impaired - to be further tested in functional context     Perception     Praxis      Pertinent Vitals/Pain Pain Assessment Pain Assessment: No/denies pain     Hand Dominance Right   Extremity/Trunk Assessment Upper Extremity Assessment Upper Extremity Assessment: LUE deficits/detail LUE Deficits / Details: functionally able to use BUE to don socks seated EOB without assist, grossly 3+/5 grip, 3/5 elbow flex/ext, and 3-/5 shoulder flexion LUE Sensation: decreased light touch LUE Coordination: decreased gross motor;decreased fine motor   Lower Extremity Assessment Lower Extremity Assessment: LLE deficits/detail LLE Deficits / Details: Pt showed decreased quality of movement and grossly 3+/5 strength with testing LLE Sensation: decreased light touch LLE Coordination: decreased fine motor;decreased gross motor       Communication Communication Communication: No difficulties   Cognition Arousal/Alertness: Awake/alert Behavior During Therapy: Anxious Overall Cognitive Status: Within Functional Limits for tasks assessed                                       General Comments      Exercises     Shoulder Instructions       Home Living Family/patient expects to be discharged to:: Skilled nursing facility Living Arrangements: Alone Available Help at Discharge:  (documentation from last year indicate sister able to assist, pt today reports little to no support available) Type of Home: House Home Access: Ramped entrance     Home Layout: One level     Bathroom Shower/Tub: Occupational psychologist: Standard     Home Equipment: Rollator (4 wheels)          Prior Functioning/Environment Prior Level of Function : Independent/Modified Independent             Mobility Comments: Pt uses [employer] all the time but reports able to drive to grocery store and run light errands on "good days" ADLs Comments: indep but pt reports increasing difficulty lately with ADL and IADL and sometimes forgetful with medication        OT Problem List: Decreased strength;Decreased coordination;Impaired sensation;Impaired balance (sitting and/or standing);Decreased knowledge of use of DME or AE;Impaired UE functional use;Impaired vision/perception      OT Treatment/Interventions: Self-care/ADL training;Therapeutic exercise;Therapeutic activities;Neuromuscular education;DME and/or AE instruction;Visual/perceptual remediation/compensation;Patient/family education;Balance training    OT Goals(Current goals can be found in the care plan section) Acute Rehab OT Goals Patient Stated Goal: get stronger at rehab OT Goal Formulation: With patient Time For Goal Achievement: 08/08/21 Potential to Achieve Goals: Good ADL Goals Pt Will Perform Lower Body Dressing: sit to/from  stand;with supervision Pt Will Transfer to Toilet: with min guard assist;ambulating (BSC over toilet, LRAD PRN) Pt Will Perform Toileting - Clothing Manipulation and hygiene: with min guard assist;sit to/from stand Additional ADL Goal #1: Pt will perform seated bath with remote supervision  OT Frequency: Min 4X/week    Co-evaluation               AM-PAC OT "6 Clicks" Daily Activity     Outcome Measure Help from another person eating meals?: None Help from another person taking care of personal grooming?: A Little Help from another person toileting, which includes using toliet, bedpan, or urinal?: A Lot Help from another person bathing (including washing, rinsing, drying)?: A Lot Help from another person to put on and taking off regular upper body clothing?: A Little Help from another person to put on and taking off regular lower body clothing?: A Lot 6 Click Score: 16   End of Session Equipment Utilized During Treatment: Gait belt;Rolling walker (2 wheels) Nurse Communication: Mobility status  Activity Tolerance: Patient tolerated treatment well Patient left: in bed;with call bell/phone within reach;with bed alarm set;with nursing/sitter in room  OT Visit Diagnosis: Repeated falls (R29.6);Hemiplegia and hemiparesis;History of falling (Z91.81) Hemiplegia - Right/Left: Left Hemiplegia - dominant/non-dominant: Non-Dominant Hemiplegia - caused by: Unspecified                Time: 1751-0258 OT Time Calculation (min): 19 min Charges:  OT General Charges $OT Visit: 1 Visit OT Evaluation $OT Eval Moderate Complexity: 1 Mod OT Treatments $Self Care/Home Management : 8-22 mins  Ardeth Perfect., MPH, MS, OTR/L ascom [phone] 07/25/21, 1:46 PM

## 2021-07-26 DIAGNOSIS — R296 Repeated falls: Secondary | ICD-10-CM

## 2021-07-26 DIAGNOSIS — R079 Chest pain, unspecified: Secondary | ICD-10-CM | POA: Diagnosis not present

## 2021-07-26 DIAGNOSIS — G459 Transient cerebral ischemic attack, unspecified: Secondary | ICD-10-CM | POA: Diagnosis not present

## 2021-07-26 DIAGNOSIS — R911 Solitary pulmonary nodule: Secondary | ICD-10-CM

## 2021-07-26 LAB — GLUCOSE, CAPILLARY
Glucose-Capillary: 165 mg/dL — ABNORMAL HIGH (ref 70–99)
Glucose-Capillary: 186 mg/dL — ABNORMAL HIGH (ref 70–99)
Glucose-Capillary: 232 mg/dL — ABNORMAL HIGH (ref 70–99)
Glucose-Capillary: 246 mg/dL — ABNORMAL HIGH (ref 70–99)

## 2021-07-26 LAB — LIPID PANEL
Cholesterol: 127 mg/dL (ref 0–200)
HDL: 47 mg/dL (ref >40)
LDL Cholesterol: 63 mg/dL (ref 0–99)
Total CHOL/HDL Ratio: 2.7 RATIO
Triglycerides: 85 mg/dL (ref <150)
VLDL: 17 mg/dL (ref 0–40)

## 2021-07-26 LAB — HEMOGLOBIN A1C
Hgb A1c MFr Bld: 7 % — ABNORMAL HIGH (ref 4.8–5.6)
Mean Plasma Glucose: 154.2 mg/dL

## 2021-07-26 MED ORDER — ISOSORBIDE MONONITRATE ER 30 MG PO TB24
60.0000 mg | ORAL_TABLET | Freq: Every day | ORAL | Status: DC
  Administered 2021-07-26 – 2021-08-02 (×8): 60 mg via ORAL
  Filled 2021-07-26 (×8): qty 2

## 2021-07-26 MED ORDER — GLUCERNA SHAKE PO LIQD
237.0000 mL | Freq: Every day | ORAL | Status: DC
  Administered 2021-07-26 – 2021-08-02 (×7): 237 mL via ORAL

## 2021-07-26 NOTE — Assessment & Plan Note (Addendum)
Incidental finding on CT angio of the neck.  2.5 cm pleural-based irregular soft tissue density along the medial aspect of the left upper lobe.  This was not commented on CT scan of the chest.  Follow-up with PCP as outpatient.  Can have a repeat CT scan of the chest in 3 months or PET CT scan.

## 2021-07-26 NOTE — Progress Notes (Signed)
[facility] Cardiology    SUBJECTIVE: The patient states that he continues to have left upper and lower extremity weakness. He states that he has constant chest heaviness that has been present consistently since prior to admission, worse with coughing, improves with morphine. He denies worsening of symptoms.   Vitals:   07/25/21 2011 07/26/21 0045 07/26/21 0535 07/26/21 0755  BP: 137/73 (!) 141/73 (!) 147/79 (!) 142/77  Pulse: (!) 56 (!) 54 69 67  Resp: 18 18 18 18   Temp: 98.3 F (36.8 C) 98.1 F (36.7 C) 98.2 F (36.8 C) (!) 97.5 F (36.4 C)  TempSrc: Oral Oral Oral Oral  SpO2: 97% 93% 96% 96%  Weight:      Height:         Intake/Output Summary (Last 24 hours) at 07/26/2021 0809 Last data filed at 07/26/2021 0535 Gross per 24 hour  Intake 241 ml  Output 1475 ml  Net -1234 ml      PHYSICAL EXAM  General: Well developed, well nourished, lying in bed in no acute distress HEENT:  Normocephalic and atramatic Neck:  No JVD.  Lungs: Clear bilaterally to auscultation, normal WOB on RA Heart: HRRR . Normal S1 and S2 without gallops or murmurs.  Abdomen: nondistended Msk:  Back normal Extremities: No clubbing, cyanosis or edema.   Neuro: Alert and oriented X 3. Psych:  Good affect, responds appropriately   LABS: Basic Metabolic Panel: Recent Labs    07/24/21 2048 07/25/21 0825  NA 131* 135  K 3.6 3.7  CL 101 104  CO2 23 23  GLUCOSE 150* 177*  BUN 20 16  CREATININE 1.26* 1.00  CALCIUM 8.8* 9.0   Liver Function Tests: Recent Labs    07/24/21 2048  AST 18  ALT 21  ALKPHOS 77  BILITOT 1.2  PROT 6.4*  ALBUMIN 3.4*   No results for input(s): LIPASE, AMYLASE in the last 72 hours. CBC: Recent Labs    07/24/21 2048  WBC 7.2  NEUTROABS 3.8  HGB 15.0  HCT 44.1  MCV 96.5  PLT 160   Cardiac Enzymes: No results for input(s): CKTOTAL, CKMB, CKMBINDEX, TROPONINI in the last 72 hours. BNP: Invalid input(s): POCBNP D-Dimer: No results for input(s): DDIMER in the  last 72 hours. Hemoglobin A1C: No results for input(s): HGBA1C in the last 72 hours. Fasting Lipid Panel: Recent Labs    07/26/21 0401  CHOL 127  HDL 47  LDLCALC 63  TRIG 85  CHOLHDL 2.7   Thyroid Function Tests: No results for input(s): TSH, T4TOTAL, T3FREE, THYROIDAB in the last 72 hours.  Invalid input(s): FREET3 Anemia Panel: No results for input(s): VITAMINB12, FOLATE, FERRITIN, TIBC, IRON, RETICCTPCT in the last 72 hours.  MR Brain Wo Contrast (neuro protocol)  Result Date: 07/25/2021 CLINICAL DATA:  Initial evaluation for neuro deficit, stroke suspected. EXAM: MRI HEAD WITHOUT CONTRAST TECHNIQUE: Multiplanar, multiecho pulse sequences of the brain and surrounding structures were obtained without intravenous contrast. COMPARISON:  Prior CT from earlier the same day. FINDINGS: Brain: Cerebral volume within normal limits. No significant cerebral white matter disease for age. No evidence for acute or subacute infarct. Gray-white matter differentiation maintained. No areas of chronic cortical infarction. No acute intracranial hemorrhage. Single punctate chronic microhemorrhage noted at the medulla, of doubtful significance in isolation. No mass lesion, midline shift or mass effect. No hydrocephalus or extra-axial fluid collection. Pituitary gland suprasellar region normal. Midline structures intact. Vascular: Major intracranial vascular flow voids are maintained. Skull and upper cervical spine: Craniocervical junction  within normal limits. Bone marrow signal intensity normal. No scalp soft tissue abnormality. Sinuses/Orbits: Globes orbital soft tissues within normal limits. Left maxillary sinusitis noted. Mild mucosal thickening noted within the ethmoidal air cells and right maxillary sinus. Small left mastoid effusion noted. Other: None. IMPRESSION: 1. Normal brain MRI for age. No acute intracranial abnormality identified. 2. Left maxillary sinusitis. Electronically Signed   By: Jeannine Boga M.D.   On: 07/25/2021 00:57   CT Angio Chest/Abd/Pel for Dissection W and/or W/WO  Result Date: 07/24/2021 CLINICAL DATA:  Mid chest pain radiating down both legs EXAM: CT ANGIOGRAPHY CHEST, ABDOMEN AND PELVIS TECHNIQUE: Non-contrast CT of the chest was initially obtained. Multidetector CT imaging through the chest, abdomen and pelvis was performed using the standard protocol during bolus administration of intravenous contrast. Multiplanar reconstructed images and MIPs were obtained and reviewed to evaluate the vascular anatomy. RADIATION DOSE REDUCTION: This exam was performed according to the departmental dose-optimization program which includes automated exposure control, adjustment of the mA and/or kV according to patient size and/or use of iterative reconstruction technique. CONTRAST:  42mL OMNIPAQUE IOHEXOL 350 MG/ML SOLN COMPARISON:  02/17/2021 FINDINGS: CTA CHEST FINDINGS Cardiovascular: 4 cm ascending thoracic aortic aneurysm. No evidence of dissection. Mild atherosclerosis of the aortic arch. The heart is unremarkable without pericardial effusion. 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 While not optimized for opacification of the pulmonary vasculature, there is sufficient contrast enhancement to exclude central pulmonary emboli. Mediastinum/Nodes: No enlarged mediastinal, hilar, or axillary lymph nodes. Thyroid gland, trachea, and esophagus demonstrate no significant findings. Lungs/Pleura: No acute airspace disease, effusion, or pneumothorax. Central airways are patent. Musculoskeletal: No acute or destructive bony lesions. Reconstructed images demonstrate no additional findings. Review of the MIP images confirms the above findings. CTA ABDOMEN AND PELVIS FINDINGS VASCULAR Aorta: Normal caliber aorta without aneurysm, dissection, vasculitis or significant stenosis. Scattered atherosclerosis. Celiac: Patent without evidence of aneurysm, dissection, vasculitis or significant stenosis.  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 Atherosclerosis at the origin of the common hepatic and celiac arteries without flow limiting stenosis. SMA: Patent without evidence of aneurysm, dissection, vasculitis or significant stenosis. Atherosclerosis at the origin of the SMA without flow limiting stenosis. Renals: Both renal arteries are patent without evidence of aneurysm, dissection, vasculitis, fibromuscular dysplasia or significant stenosis. Stable mild atherosclerosis. IMA: Patent without evidence of aneurysm, dissection, vasculitis or significant stenosis. Inflow: Patent right common iliac artery stent unchanged. Stable atherosclerosis without flow limiting stenosis, aneurysm, or dissection. Veins: No obvious venous abnormality within the limitations of this arterial phase study. Review of the MIP images confirms the above findings. NON-VASCULAR Hepatobiliary: No focal liver abnormality is seen. No gallstones, gallbladder wall thickening, or biliary dilatation. Pancreas: Incidental pancreatic divisum. Normal enhancement of the pancreatic parenchyma. No inflammatory changes or pancreatic duct dilation. Spleen: Normal in size without focal abnormality. Adrenals/Urinary Tract: Adrenal glands are unremarkable. Kidneys are normal, without renal calculi, focal lesion, or hydronephrosis. Bladder is unremarkable. Stomach/Bowel: No bowel obstruction or ileus. Normal appendix right lower quadrant. No bowel wall thickening or inflammatory change. Lymphatic: No pathologic adenopathy within the abdomen or pelvis. Reproductive: Prostate is unremarkable. Other: No free fluid or free intraperitoneal gas. No abdominal wall hernia. Musculoskeletal: No acute or destructive bony lesions. Reconstructed images demonstrate no additional findings. Review of the MIP images confirms the above findings. IMPRESSION: 1. 4 cm ascending thoracic aortic aneurysm. Recommend annual imaging followup by  CTA or MRA. This recommendation follows 2010 ACCF/AHA/AATS/ACR/ASA/SCA/SCAI/SIR/STS/SVM Guidelines  for the Diagnosis and Management of Patients with Thoracic Aortic Disease. Circulation. 2010; 121: F818-E993. Aortic aneurysm NOS (ICD10-I71.9) 2. No evidence of aortic dissection. 3. No acute intrathoracic, intra-abdominal, or intrapelvic process. 4.  Aortic Atherosclerosis (ICD10-I70.0). Electronically Signed   By: Randa Ngo M.D.   On: 07/24/2021 21:04   CT HEAD CODE STROKE WO CONTRAST  Result Date: 07/24/2021 CLINICAL DATA:  Code stroke.  Acute neuro deficit. EXAM: CT HEAD WITHOUT CONTRAST TECHNIQUE: Contiguous axial images were obtained from the base of the skull through the vertex without intravenous contrast. RADIATION DOSE REDUCTION: This exam was performed according to the departmental dose-optimization program which includes automated exposure control, adjustment of the mA and/or kV according to patient size and/or use of iterative reconstruction technique. COMPARISON:  CT head 09/17/2020 FINDINGS: Brain: No evidence of acute infarction, hemorrhage, hydrocephalus, extra-axial collection or mass lesion/mass effect. Vascular: Symmetric hyperintensity in the middle cerebral arteries bilaterally and in the basilar. No focal vascular hyperdensity. Skull: Negative Sinuses/Orbits: Mild mucosal edema paranasal sinuses. Negative orbit Other: None ASPECTS ([place] Stroke Program Early CT Score) - Ganglionic level infarction (caudate, lentiform nuclei, internal capsule, insula, M1-M3 cortex): 7 - Supraganglionic infarction (M4-M6 cortex): 3 Total score (0-10 with 10 being normal): 10 IMPRESSION: 1. No acute intracranial abnormality. 2. ASPECTS is 10 3. Code stroke imaging results were communicated on 07/24/2021 at 7:52 pm to provider Rory Percy via text page Electronically Signed   By: Franchot Gallo M.D.   On: 07/24/2021 19:53   CT ANGIO HEAD NECK W WO CM W PERF (CODE STROKE)  Result Date: 07/24/2021 CLINICAL DATA:   Initial evaluation for neuro deficit, stroke suspected. EXAM: CT ANGIOGRAPHY HEAD AND NECK CT PERFUSION BRAIN TECHNIQUE: Multidetector CT imaging of the head and neck was performed using the standard protocol during bolus administration of intravenous contrast. Multiplanar CT image reconstructions and MIPs were obtained to evaluate the vascular anatomy. Carotid stenosis measurements (when applicable) are obtained utilizing NASCET criteria, using the distal internal carotid diameter as the denominator. Multiphase CT imaging of the brain was performed following IV bolus contrast injection. Subsequent parametric perfusion maps were calculated using RAPID software. RADIATION DOSE REDUCTION: This exam was performed according to the departmental dose-optimization program which includes automated exposure control, adjustment of the mA and/or kV according to patient size and/or use of iterative reconstruction technique. CONTRAST:  4mL OMNIPAQUE IOHEXOL 350 MG/ML SOLN COMPARISON:  Prior head CT from earlier the same day. FINDINGS: CTA NECK FINDINGS Aortic arch: Visualized aortic arch normal in caliber with normal 3 vessel morphology. Mild-to-moderate atheromatous change about the arch and origin of the great vessels without significant stenosis. Right carotid system: Right CCA widely patent proximally. Vascular stent spans the distal right CCA/right bifurcation. Patent flow through the stent. Right ICA patent distally without stenosis or dissection. Left carotid system: Left CCA patent proximally. Vascular stent spans the distal left CCA/left bifurcation. Patent flow through the stent. Left ICA patent distally without stenosis or dissection. Vertebral arteries: Both vertebral arteries arise from the subclavian arteries. Vertebral arteries patent without stenosis or dissection. Skeleton: No discrete or worrisome osseous lesions. Patient is edentulous. Prior fusion at C5-C7, with posterior decompression at C3 through C5.  Other neck: No other acute soft tissue abnormality within the neck. Upper chest: There is a 2.5 cm pleural based irregular soft tissue density along the medial aspect of the left upper lobe, immediately adjacent to the aortic arch (series 9, image 7), indeterminate. Visualized upper chest demonstrates no other acute finding. Review  of the MIP images confirms the above findings CTA HEAD FINDINGS Anterior circulation: Petrous segments patent bilaterally. Mild atheromatous change within the carotid siphons without significant stenosis. A1 segments patent bilaterally. Right A1 hypoplastic. Normal anterior communicating artery complex. Anterior cerebral arteries patent without stenosis. No M1 stenosis or occlusion. Normal MCA bifurcations. Distal MCA branches perfused and symmetric. Posterior circulation: Both V4 segments patent without stenosis. Left PICA patent. Right PICA not seen. Basilar patent to its distal aspect without stenosis. Superior cerebral arteries patent bilaterally. Left PCA supplied via the basilar. Fetal type origin right PCA. Both PCAs patent to their distal aspects. Venous sinuses: Patent allowing for timing the contrast bolus. Anatomic variants: Fetal type origin of the right PCA.  No aneurysm. Review of the MIP images confirms the above findings CT Brain Perfusion Findings: ASPECTS: 10. CBF (<30%) Volume: 30mL Perfusion (Tmax>6.0s) volume: 24mL Mismatch Volume: 68mL Infarction Location:Negative CT perfusion with no evidence for acute ischemia or other perfusion deficit. IMPRESSION: 1. Negative CTA for emergent large vessel occlusion. 2. Negative CT perfusion with no evidence for acute ischemia or other perfusion deficit. 3. Bilateral carotid artery stents in place with patent flow. 4. Mild for age atheromatous change without hemodynamically significant or correctable stenosis. 5. 2.5 cm pleural based irregular soft tissue density along the medial aspect of the left upper lobe, indeterminate. While  this finding could reflect an area of focal scarring and/or atelectasis, a possible pleural base nodule is not excluded. Consider one of the following in 3 months for both low-risk and high-risk individuals: (a) repeat chest CT, (b) follow-up PET-CT, or (c) tissue sampling. This recommendation follows the consensus statement: Guidelines for Management of Incidental Pulmonary Nodules Detected on CT Images: From the Fleischner Society 2017; Radiology 2017; 284:228-243. Electronically Signed   By: Jeannine Boga M.D.   On: 07/24/2021 22:21     TELEMETRY: sinus rhythm  ASSESSMENT AND PLAN:  Principal Problem:   TIA (transient ischemic attack) Active Problems:   DM (diabetes mellitus), type 2 with peripheral vascular complications (HCC)   Hyperlipidemia   Essential hypertension   Carotid stenosis   PAF (paroxysmal atrial fibrillation) (HCC)   CAD (coronary artery disease)   AKI (acute kidney injury) ([place])   Chest pain   Chronic diastolic CHF (congestive heart failure) (HCC)   Ascending aortic aneurysm   Lung nodule    Chest pain, with history of CAD (CABG x 4 in 2006, multiple stents, most recent 04/2021 SVG-RCA; LHC unchanged from previous in 06/2020, 2/4 patent stents, diffuse 3VD); high sensitivity troponin normal x 3. ECG reveals sinus rhythm with Q waves II, III and aVF with minimal ST elevation in V5, V6 without reciprocal changes. Repeat ECG without dynamic ECG changes. Chest pain atypical. TIA, brain MRI and CT negative for stroke Paroxysmal atrial fibrillation, on Eliquis Hypertension Carotid stenosis, on statin, Zetia and Plavix Chronic HFpEF, appears euvolemic  Plan: Continue Eliquis for stroke prevention Continue increased dose of isosorbide mononitrate 60 mg and Ranexa Continue Zetia and high intensity statin Continue carvedilol. Avoid ACEi as patient reports throat well with lisinopril Defer further cardiac diagnostics at this time with negative troponin x 3, no  dynamic ECG changes, and atypical chest pain. Consider discharge today from cardiovascular perspective  Clabe Seal, PA-C 07/26/2021 8:09 AM  Sign off for now; please [place] with any questions.

## 2021-07-26 NOTE — Assessment & Plan Note (Deleted)
Patient had a frequent falls, and generalized weakness.  Has been evaluated by PT/OT, recommend nursing placement.

## 2021-07-26 NOTE — Progress Notes (Signed)
Occupational Therapy Treatment Patient Details Name: Bryan Scott MRN: [id number] DOB: June 22, 1945 Today's Date: 07/26/2021   History of present illness 76 y/o male who came in with L sided weakness and chest pain.  history of of CAD, A-fib on Eliquis with last dose this morning, hypertension, diabetes, hyperlipidemia, and previous strokes who presents for sudden onset of slurred speech, confusion, and left-sided deficits.  Code stroke, imaging is negtive for acute changes.   OT comments  Pt seen for OT tx. Pt reporting continued chest pain, 7/10. Pt performed bed mobility with MIN A for trunk elevation. Sat EOB for grooming tasks with set up and supv. Able to manage toothpaste with BUE with noted mild FMC difficulties with LUE. Pt continues endorse decr sensation on L side. Pt reported dizziness upon sitting that did not improve once laying back down. Sitting BP 93/62, HR 60.    Recommendations for follow up therapy are one component of a multi-disciplinary discharge planning process, led by the attending physician.  Recommendations may be updated based on patient status, additional functional criteria and insurance authorization.    Follow Up Recommendations  Skilled nursing-short term rehab (<3 hours/day)    Assistance Recommended at Discharge Intermittent Supervision/Assistance  Patient can return home with the following  A lot of help with walking and/or transfers;A lot of help with bathing/dressing/bathroom;Assistance with cooking/housework;Assist for transportation;Help with stairs or ramp for entrance;Direct supervision/assist for medications management   Equipment Recommendations  BSC/3in1    Recommendations for Other Services      Precautions / Restrictions Precautions Precautions: Fall Restrictions Weight Bearing Restrictions: No       Mobility Bed Mobility Overal bed mobility: Needs Assistance Bed Mobility: Supine to Sit, Sit to Supine     Supine to sit: Min assist,  HOB elevated Sit to supine: Supervision        Transfers                         Balance Overall balance assessment: Needs assistance Sitting-balance support: No upper extremity supported Sitting balance-Leahy Scale: Good                                     ADL either performed or assessed with clinical judgement   ADL Overall ADL's : Needs assistance/impaired     Grooming: Sitting;Wash/dry hands;Wash/dry face;Oral care                                      Extremity/Trunk Assessment              Vision       Perception     Praxis      Cognition Arousal/Alertness: Awake/alert Behavior During Therapy: WFL for tasks assessed/performed Overall Cognitive Status: Within Functional Limits for tasks assessed                                          Exercises      Shoulder Instructions       General Comments      Pertinent Vitals/ Pain       Pain Assessment Pain Assessment: 0-10 Pain Score: 7  Pain Location: chest pain " its better now that I  got pain medication." Pain Descriptors / Indicators: Aching Pain Intervention(s): Limited activity within patient's tolerance, Monitored during session, Premedicated before session, Repositioned  Home Living                                          Prior Functioning/Environment              Frequency  Min 4X/week        Progress Toward Goals  OT Goals(current goals can now be found in the care plan section)  Progress towards OT goals: Progressing toward goals  Acute Rehab OT Goals Patient Stated Goal: get stronger at rehab OT Goal Formulation: With patient Time For Goal Achievement: 08/08/21 Potential to Achieve Goals: Good  Plan Discharge plan remains appropriate;Frequency remains appropriate    Co-evaluation                 AM-PAC OT "6 Clicks" Daily Activity     Outcome Measure   Help from another person  eating meals?: None Help from another person taking care of personal grooming?: A Little Help from another person toileting, which includes using toliet, bedpan, or urinal?: A Lot Help from another person bathing (including washing, rinsing, drying)?: A Lot Help from another person to put on and taking off regular upper body clothing?: A Little Help from another person to put on and taking off regular lower body clothing?: A Lot 6 Click Score: 16    End of Session Equipment Utilized During Treatment: Gait belt;Rolling walker (2 wheels)  OT Visit Diagnosis: Repeated falls (R29.6);Hemiplegia and hemiparesis;History of falling (Z91.81) Hemiplegia - Right/Left: Left Hemiplegia - dominant/non-dominant: Non-Dominant Hemiplegia - caused by: Unspecified   Activity Tolerance Patient tolerated treatment well   Patient Left in bed;with call bell/phone within reach;with bed alarm set   Nurse Communication Mobility status        Time: 2353-6144 OT Time Calculation (min): 14 min  Charges: OT General Charges $OT Visit: 1 Visit OT Treatments $Self Care/Home Management : 8-22 mins  Ardeth Perfect., MPH, MS, OTR/L ascom [phone] 07/26/21, 1:16 PM

## 2021-07-26 NOTE — Assessment & Plan Note (Signed)
Chest pain is not consistent with cardiac origin.  Continue home medicines.

## 2021-07-26 NOTE — Progress Notes (Signed)
Brief Nutrition Follow-Up Note  Chart reviewed. Noted orders for Glucerna 1.2 daily (via oral administration route). Pt does not have feeding access (NGT, PEG, etc). Glucerna 1.2 is an enteral formula designed to be administered via feeding tube; RD changed order to Glucerna shake PO daily.   Loistine Chance, RD, LDN, [place] Registered Dietitian II Certified Diabetes Care and Education Specialist Please refer to [facility] for RD and/or RD on-call/weekend/after hours pager

## 2021-07-26 NOTE — Progress Notes (Signed)
Physical Therapy Treatment Patient Details Name: Bryan Scott MRN: [id number] DOB: [birth year] Today's Date: 07/26/2021   History of Present Illness 76 y/o male who came in with L sided weakness and chest pain.  history of of CAD, A-fib on Eliquis with last dose this morning, hypertension, diabetes, hyperlipidemia, and previous strokes who presents for sudden onset of slurred speech, confusion, and left-sided deficits.  Code stroke, imaging is negtive for acute changes.    PT Comments    Pt is A and O x 3. Agreeable to PT session and cooperative throughout. He did receive pain medication prior to session. Pt required more assistance with all mobility, transfers, and even to take a few steps along EOB. Pt have severe motor planning deficits with ataxic movements. He is extremely high fall risk due to knee buckling and poor strength overall. HR was stable but RR quickly elevated with minimal standing activity. Pt performed several ther ex at EOB and in bed. He will need extensive PT going forward. Highly recommend DC to SNF to address deficits with all ADLs.    Recommendations for follow up therapy are one component of a multi-disciplinary discharge planning process, led by the attending physician.  Recommendations may be updated based on patient status, additional functional criteria and insurance authorization.  Follow Up Recommendations  Skilled nursing-short term rehab (<3 hours/day)     Assistance Recommended at Discharge Intermittent Supervision/Assistance  Patient can return home with the following A lot of help with walking and/or transfers;A lot of help with bathing/dressing/bathroom;Assistance with cooking/housework;Assistance with feeding;Assist for transportation;Help with stairs or ramp for entrance   Equipment Recommendations  None recommended by PT (defer to next level of care)       Precautions / Restrictions Precautions Precautions: Fall Restrictions Weight Bearing  Restrictions: No     Mobility  Bed Mobility Overal bed mobility: Needs Assistance Bed Mobility: Supine to Sit (HOB elevated ~ 15 degrees)     Supine to sit: Min assist     General bed mobility comments: Increased time + vcs for technique and sequencing.    Transfers Overall transfer level: Needs assistance Equipment used: Rolling walker (2 wheels) Transfers: Sit to/from Stand Sit to Stand: Mod assist           General transfer comment: Pt has ataxic movements and required mod assist + max vsc for technique. knee buckling present. Pt fatigued quickly    Ambulation/Gait Ambulation/Gait assistance: Mod assist Gait Distance (Feet): 3 Feet Assistive device: Rolling walker (2 wheels) Gait Pattern/deviations: Step-to pattern, Ataxic, Shuffle, Knees buckling Gait velocity: decreased     General Gait Details: Pt was able to take a few steps away from EOPB however is extremely unsteady/unsafe. Ataxic poor motor planning throughout. knees tends to slightly buckle at times.     Balance Overall balance assessment: Needs assistance Sitting-balance support: No upper extremity supported Sitting balance-Leahy Scale: Good     Standing balance support: Bilateral upper extremity supported, Reliant on assistive device for balance Standing balance-Leahy Scale: Fair         Cognition Arousal/Alertness: Awake/alert Behavior During Therapy: WFL for tasks assessed/performed Overall Cognitive Status: Within Functional Limits for tasks assessed      General Comments: Pt is A and O x 4               Pertinent Vitals/Pain Pain Assessment Pain Assessment: 0-10 Pain Score: 5  Pain Location: chest pain " its better now that I got pain medication." Pain Intervention(s): Limited activity  within patient's tolerance, Monitored during session, Premedicated before session, Repositioned     PT Goals (current goals can now be found in the care plan section) Acute Rehab PT  Goals Patient Stated Goal: get better then go home Progress towards PT goals: Progressing toward goals    Frequency    Min 2X/week      PT Plan Current plan remains appropriate       AM-PAC PT "6 Clicks" Mobility   Outcome Measure  Help needed turning from your back to your side while in a flat bed without using bedrails?: A Little Help needed moving from lying on your back to sitting on the side of a flat bed without using bedrails?: A Little Help needed moving to and from a bed to a chair (including a wheelchair)?: A Lot Help needed standing up from a chair using your arms (e.g., wheelchair or bedside chair)?: A Lot Help needed to walk in hospital room?: A Lot Help needed climbing 3-5 steps with a railing? : A Lot 6 Click Score: 14    End of Session Equipment Utilized During Treatment: Gait belt Activity Tolerance: Patient limited by fatigue Patient left: with bed alarm set;with call bell/phone within reach Nurse Communication: Mobility status PT Visit Diagnosis: Muscle weakness (generalized) (M62.81);Difficulty in walking, not elsewhere classified (R26.2);Unsteadiness on feet (R26.81);Hemiplegia and hemiparesis Hemiplegia - Right/Left: Left Hemiplegia - dominant/non-dominant: Non-dominant Hemiplegia - caused by: Unspecified     Time: 2800-3491 PT Time Calculation (min) (ACUTE ONLY): 29 min  Charges:  $Therapeutic Activity: 23-37 mins                     Julaine Fusi PTA 07/26/21, 10:03 AM

## 2021-07-26 NOTE — Assessment & Plan Note (Addendum)
Continue Eliquis for anticoagulation to prevent stroke.

## 2021-07-26 NOTE — Assessment & Plan Note (Signed)
Patient evaluated by cardiology, troponin was normal. Patient has chest pain for the last 3 days, he also had multiple falls over the same period time.  Cardiology does not advise any additional work-up.

## 2021-07-26 NOTE — Assessment & Plan Note (Addendum)
Creatinine peaked at 1.26.  Last creatinine 1.0.

## 2021-07-26 NOTE — Progress Notes (Signed)
°  Progress Note   Patient: Bryan Scott OTL:[id number] DOB: December 26, 1945 DOA: 07/24/2021     0 DOS: the patient was seen and examined on 07/26/2021   Brief hospital course: Bryan Scott is a 76 y.o. male with medical history significant of hypertension, hyperlipidemia, diabetes mellitus, depression, PAF on Eliquis, former smoker, carotid artery stenosis, pancreatitis, dCHF, bladder cancer, who presents with chest pain, left-sided weakness.  Assessment and Plan: * TIA (transient ischemic attack)- (present on admission) Patient has been evaluated by Dr. Rory Percy, MRI of the brain did not show evidence of stroke.  Patient will be continued on Eliquis, Plavix.  No change in treatment.  Frequent falls Patient had a frequent falls, and generalized weakness.  Has been evaluated by PT/OT, recommend nursing placement.  Lung nodule Incidental finding on CT scan, patient be followed by PCP for repeat chest CT scan in 3 to 6 months.  Chest pain- (present on admission) Patient evaluated by cardiology, troponin was normal. Patient has chest pain for the last 3 days, he also had multiple falls over the same period time.  Cardiology does not advise any additional work-up.  AKI (acute kidney injury) ([place])- (present on admission) Renal function has normalized.  CAD (coronary artery disease)- (present on admission) Chest pain is not consistent with cardiac origin.  Continue home medicines.  Hyponatremia- (present on admission) Sodium level has normalized.  PAF (paroxysmal atrial fibrillation) ([place])- (present on admission) Heart rate is controlled, continue anticoagulation.     Subjective: Patient still complaining of chest pain, persistent, worse with a deep breath.  Negative PE on CT angiogram. Denies any abdominal pain or nausea vomiting.   Physical Exam: Vitals:   07/26/21 0045 07/26/21 0535 07/26/21 0755 07/26/21 1144  BP: (!) 141/73 (!) 147/79 (!) 142/77 93/62  Pulse: (!) 54 69 67 60  Resp: 18  18 18 20   Temp: 98.1 F (36.7 C) 98.2 F (36.8 C) (!) 97.5 F (36.4 C) 97.6 F (36.4 C)  TempSrc: Oral Oral Oral Oral  SpO2: 93% 96% 96% 95%  Weight:      Height:       General exam: Appears calm and comfortable  Respiratory system: Clear to auscultation. Respiratory effort normal. Cardiovascular system: S1 & S2 heard, RRR. No JVD, murmurs, rubs, gallops or clicks. No pedal edema. Gastrointestinal system: Abdomen is nondistended, soft and nontender. No organomegaly or masses felt. Normal bowel sounds heard. Central nervous system: Alert and oriented. No focal neurological deficits. Extremities: Symmetric 5 x 5 power. Skin: No rashes, lesions or ulcers Psychiatry: Judgement and insight appear normal. Mood & affect appropriate.   Data Reviewed:  Family Communication: None  Disposition: Status is: Observation   Patient currently pending nursing home placement.  Reviewed BMP, lipid panel, CT angiogram of the chest, MRI of the brain.  Echocardiogram.    Planned Discharge Destination: Skilled nursing facility   Time spent: 34 minutes  Author: Sharen Hones, MD 07/26/2021 1:15 PM  For on call review [email or web address].

## 2021-07-26 NOTE — Hospital Course (Signed)
Bryan Scott is a 76 y.o. male with medical history significant of hypertension, hyperlipidemia, diabetes mellitus, depression, PAF on Eliquis, former smoker, carotid artery stenosis, pancreatitis, dCHF, bladder cancer, who presents with chest pain, left-sided weakness.

## 2021-07-26 NOTE — Assessment & Plan Note (Addendum)
Patient still has left-sided weakness.  MRI of the brain did not show evidence of stroke.  Patient will be continued on Eliquis, Plavix.

## 2021-07-26 NOTE — Assessment & Plan Note (Addendum)
Last sodium 135

## 2021-07-27 DIAGNOSIS — N179 Acute kidney failure, unspecified: Secondary | ICD-10-CM

## 2021-07-27 DIAGNOSIS — G459 Transient cerebral ischemic attack, unspecified: Secondary | ICD-10-CM | POA: Diagnosis not present

## 2021-07-27 DIAGNOSIS — R079 Chest pain, unspecified: Secondary | ICD-10-CM | POA: Diagnosis not present

## 2021-07-27 LAB — GLUCOSE, CAPILLARY
Glucose-Capillary: 191 mg/dL — ABNORMAL HIGH (ref 70–99)
Glucose-Capillary: 262 mg/dL — ABNORMAL HIGH (ref 70–99)
Glucose-Capillary: 237 mg/dL — ABNORMAL HIGH (ref 70–99)
Glucose-Capillary: 192 mg/dL — ABNORMAL HIGH (ref 70–99)

## 2021-07-27 MED ORDER — LACTULOSE 10 GM/15ML PO SOLN
20.0000 g | Freq: Once | ORAL | Status: AC
  Administered 2021-07-27: 20 g via ORAL
  Filled 2021-07-27: qty 30

## 2021-07-27 NOTE — TOC Initial Note (Addendum)
Transition of Care [facility]) - Initial/Assessment Note    Patient Details  Name: Bryan Scott MRN: [id number] Date of Birth: September 14, 1945  Transition of Care [facility]) CM/SW Contact:    Magnus Ivan, LCSW Phone Number: 07/27/2021, 9:18 AM  Clinical Narrative:                CSW spoke with patient regarding SNF rec. Patient states he lives alone and occasionally drives himself places. PCP is Dr. Lovie Macadamia. Pharmacy is [facility]. Patient uses a rollator. Patient had [facility] in the past per notes. Patient stated he is agreeable to SNF, went to [facility] several years ago and would prefer to return there. CSW reached out to Viola at [facility] if they can take his insurance. CSW starting SNF work up.  12:07- Spoke to [facility], they aren't taking the 3 day waiver so would not be able to take patient even if they can take his insurance. Waiting on other bed offers.   Expected Discharge Plan: Skilled Nursing Facility Barriers to Discharge: Continued Medical Work up   Patient Goals and CMS Choice Patient states their goals for this hospitalization and ongoing recovery are:: SNF CMS Medicare.gov Compare Post Acute Care list provided to:: Patient Choice offered to / list presented to : Patient  Expected Discharge Plan and Services Expected Discharge Plan: [place]       Living arrangements for the past 2 months: Single Family Home                                      Prior Living Arrangements/Services Living arrangements for the past 2 months: Single Family Home Lives with:: Self Patient language and need for interpreter reviewed:: Yes Do you feel safe going back to the place where you live?: Yes      Need for Family Participation in Patient Care: Yes (Comment) Care giver support system in place?: Yes (comment) Current home services: DME Criminal Activity/Legal Involvement Pertinent to Current Situation/Hospitalization: No - Comment as  needed  Activities of Daily Living Home Assistive Devices/Equipment: None ADL Screening (condition at time of admission) Patient's cognitive ability adequate to safely complete daily activities?: Yes Is the patient deaf or have difficulty hearing?: No Does the patient have difficulty seeing, even when wearing glasses/contacts?: Yes Does the patient have difficulty concentrating, remembering, or making decisions?: No Patient able to express need for assistance with ADLs?: Yes Does the patient have difficulty dressing or bathing?: Yes Independently performs ADLs?: No Communication: Independent Dressing (OT): Needs assistance Is this a change from baseline?: Change from baseline, expected to last <3days Grooming: Independent Feeding: Independent Bathing: Needs assistance Is this a change from baseline?: Change from baseline, expected to last <3 days Toileting: Needs assistance Is this a change from baseline?: Change from baseline, expected to last <3 days In/Out Bed: Needs assistance Is this a change from baseline?: Change from baseline, expected to last <3 days Walks in Home: Needs assistance Is this a change from baseline?: Change from baseline, expected to last <3 days Does the patient have difficulty walking or climbing stairs?: Yes Weakness of Legs: Both Weakness of Arms/Hands: Left  Permission Sought/Granted Permission sought to share information with : Facility Art therapist granted to share information with : Yes, Verbal Permission Granted     Permission granted to share info w AGENCY: SNFs        Emotional  Assessment       Orientation: : Oriented to Self, Oriented to Place, Oriented to  Time, Oriented to Situation Alcohol / Substance Use: Not Applicable Psych Involvement: No (comment)  Admission diagnosis:  Unstable angina (HCC) [I20.0] TIA (transient ischemic attack) [G45.9] Numbness [R20.0] Slurred speech [R47.81] Facial droop  [R29.810] Muscle weakness of left upper extremity [M62.81] Left-sided weakness [R53.1] Chest pain, unspecified type [R07.9] Patient Active Problem List   Diagnosis Date Noted   Lung nodule 07/26/2021   Frequent falls 07/26/2021   TIA (transient ischemic attack) 07/25/2021   Chronic diastolic CHF (congestive heart failure) ([place]) 07/25/2021   Ascending aortic aneurysm 07/25/2021   Chest pain 02/17/2021   Renal insufficiency 02/17/2021   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   Non-intractable vomiting    Dehydration    Fall    Injury    Malnutrition of moderate degree 09/19/2020   Protein-calorie malnutrition ([place]) 09/18/2020   Syncope 09/17/2020   History of pancreatitis    Cervical radiculopathy    Acute pancreatitis 08/07/2020   Hyperlipidemia associated with type 2 diabetes mellitus ([place]) 08/07/2020   NSTEMI (non-ST elevated myocardial infarction) ([place]) 07/11/2020   AKI (acute kidney injury) ([place]) 40/98/1191   Acute metabolic encephalopathy 47/82/9562   GERD (gastroesophageal reflux disease) 09/09/2019   CAD (coronary artery disease) 09/09/2019   Chronic pain syndrome 09/09/2019   Leukocytosis 09/09/2019   Shortness of breath    Hyponatremia 09/26/2016   Left-sided chest pain 09/22/2016   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   Abnormal EKG 09/21/2016   Chronic pain 09/21/2016   PAF (paroxysmal atrial fibrillation) ([place]) 09/21/2016   Essential hypertension 05/02/2016   Carotid stenosis 05/02/2016   CAD in native artery    Atypical chest pain    DM (diabetes mellitus), type 2 with peripheral vascular complications (HCC)    Hyperlipidemia    Pain of left upper extremity    Angina pectoris ([place])    Abdominal pain    Chest pain, central 03/13/2015   Hypertensive urgency 03/13/2015   Spinal stenosis 01/10/2012   PCP:  [name], MD Pharmacy:   [place], [place]  [address] Phone: [phone] Fax: [place], [place] [address] Phone: [phone] Fax: [phone]     Social Determinants of Health (SDOH) Interventions    Readmission Risk Interventions No flowsheet data found.

## 2021-07-27 NOTE — NC FL2 (Signed)
[place] LEVEL OF CARE SCREENING TOOL     IDENTIFICATION  Patient Name: Bryan Scott Birthdate: 1945-07-31 Sex: male Admission Date (Current Location): 07/24/2021  [facility] [place] Number:  Engineering geologist and Address:  [facility], [address] [place], [place] [address]      Provider Number: [id number]  Attending Physician Name and Address:  Sharen Hones, MD  Relative Name and Phone Number:       Current Level of Care: Hospital Recommended Level of Care: [place] Prior Approval Number:    Date Approved/Denied:   PASRR Number: [id number]  Discharge Plan:      Current Diagnoses: Patient Active Problem List   Diagnosis Date Noted   Lung nodule 07/26/2021   Frequent falls 07/26/2021   TIA (transient ischemic attack) 07/25/2021   Chronic diastolic CHF (congestive heart failure) (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   Non-intractable vomiting    Dehydration    Fall    Injury    Malnutrition of moderate degree 09/19/2020   Protein-calorie malnutrition ([place]) 09/18/2020   Syncope 09/17/2020   History of pancreatitis    Cervical radiculopathy    Acute pancreatitis 08/07/2020   Hyperlipidemia associated with type 2 diabetes mellitus ([place]) 08/07/2020   NSTEMI (non-ST elevated myocardial infarction) ([place]) 07/11/2020   AKI (acute kidney injury) ([place]) 92/04/9416   Acute metabolic encephalopathy 40/81/4481   GERD (gastroesophageal reflux disease) 09/09/2019   CAD (coronary artery disease) 09/09/2019   Chronic pain syndrome 09/09/2019   Leukocytosis 09/09/2019   Shortness of breath    Hyponatremia 09/26/2016   Left-sided chest pain 09/22/2016   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   Abnormal EKG 09/21/2016   Chronic pain 09/21/2016   PAF (paroxysmal atrial  fibrillation) ([place]) 09/21/2016   Essential hypertension 05/02/2016   Carotid stenosis 05/02/2016   CAD in native artery    Atypical chest pain    DM (diabetes mellitus), type 2 with peripheral vascular complications (HCC)    Hyperlipidemia    Pain of left upper extremity    Angina pectoris (HCC)    Abdominal pain    Chest pain, central 03/13/2015   Hypertensive urgency 03/13/2015   Spinal stenosis 01/10/2012    Orientation RESPIRATION BLADDER Height & Weight     Self, Time, Situation, Place  Normal Continent Weight: 186 lb 11.7 oz (84.7 kg) Height:  6' (182.9 cm)  BEHAVIORAL SYMPTOMS/MOOD NEUROLOGICAL BOWEL NUTRITION STATUS        Diet (heart healthy/carb modified)  AMBULATORY STATUS COMMUNICATION OF NEEDS Skin   Limited Assist Verbally Other (Comment) (excoriated)                       Personal Care Assistance Level of Assistance  Bathing, Feeding, Dressing Bathing Assistance: Limited assistance Feeding assistance: Limited assistance Dressing Assistance: Limited assistance     Functional Limitations Info             SPECIAL CARE FACTORS FREQUENCY  PT (By licensed PT), OT (By licensed OT)     PT Frequency: 5 times per week OT Frequency: 5 times per week            Contractures      Additional Factors Info  Code Status, Allergies Code Status Info: full Allergies Info: Aspergum (Aspirin), Aspirin, Lisinopril, Angiotensin Receptor Blockers, Heparin  Current Medications (07/27/2021):  This is the current hospital active medication list Current Facility-Administered Medications  Medication Dose Route Frequency Provider Last Rate Last Admin   acetaminophen (TYLENOL) tablet 650 mg  650 mg Oral Q4H PRN Ivor Costa, MD   650 mg at 07/25/21 1641   Or   acetaminophen (TYLENOL) 160 MG/5ML solution 650 mg  650 mg Per Tube Q4H PRN Ivor Costa, MD       Or   acetaminophen (TYLENOL) suppository 650 mg  650 mg Rectal Q4H PRN Ivor Costa, MD        acetaminophen (TYLENOL) tablet 650 mg  650 mg Oral Once Naaman Plummer, MD       apixaban (ELIQUIS) tablet 5 mg  5 mg Oral BID Ivor Costa, MD   5 mg at 07/27/21 0800   atorvastatin (LIPITOR) tablet 80 mg  80 mg Oral Gordan Payment, MD   80 mg at 07/26/21 2044   carvedilol (COREG) tablet 6.25 mg  6.25 mg Oral BID WC Ivor Costa, MD   6.25 mg at 07/27/21 0801   clopidogrel (PLAVIX) tablet 75 mg  75 mg Oral q morning Ivor Costa, MD   75 mg at 07/27/21 0805   DULoxetine (CYMBALTA) DR capsule 30 mg  30 mg Oral Daily Ivor Costa, MD   30 mg at 07/27/21 0800   ezetimibe (ZETIA) tablet 10 mg  10 mg Oral Daily Ivor Costa, MD   10 mg at 07/27/21 0801   feeding supplement (GLUCERNA SHAKE) ([place]) liquid 237 mL  237 mL Oral Daily Sharen Hones, MD   237 mL at 07/26/21 1707   gabapentin (NEURONTIN) capsule 100 mg  100 mg Oral TID Ivor Costa, MD   100 mg at 07/27/21 0800   hydrALAZINE (APRESOLINE) injection 5 mg  5 mg Intravenous Q2H PRN Ivor Costa, MD       insulin aspart (novoLOG) injection 0-5 Units  0-5 Units Subcutaneous QHS Ivor Costa, MD   2 Units at 07/26/21 2117   insulin aspart (novoLOG) injection 0-9 Units  0-9 Units Subcutaneous TID WC Ivor Costa, MD   2 Units at 07/27/21 0801   isosorbide mononitrate (IMDUR) 24 hr tablet 60 mg  60 mg Oral Daily Clabe Seal, PA-C   60 mg at 07/27/21 0800   melatonin tablet 5 mg  5 mg Oral QHS Ivor Costa, MD   5 mg at 07/26/21 2044   morphine (PF) 2 MG/ML injection 2 mg  2 mg Intravenous Q4H PRN Ivor Costa, MD   2 mg at 07/27/21 0503   multivitamin with minerals tablet 1 tablet  1 tablet Oral Daily Ivor Costa, MD   1 tablet at 07/27/21 0800   nitroGLYCERIN (NITROSTAT) SL tablet 0.4 mg  0.4 mg Sublingual Q5 min PRN Ivor Costa, MD       ondansetron [facility]) injection 4 mg  4 mg Intravenous Q8H PRN Ivor Costa, MD   4 mg at 07/26/21 1206   pantoprazole (PROTONIX) EC tablet 40 mg  40 mg Oral Daily Ivor Costa, MD   40 mg at 07/27/21 0800   polyethylene glycol  (MIRALAX / GLYCOLAX) packet 17 g  17 g Oral Daily PRN Ivor Costa, MD       ranolazine (RANEXA) 12 hr tablet 500 mg  500 mg Oral BID Ivor Costa, MD   500 mg at 07/27/21 0800   senna-docusate (Senokot-S) tablet 1 tablet  1 tablet Oral QHS PRN Ivor Costa, MD  senna-docusate (Senokot-S) tablet 2 tablet  2 tablet Oral BID Ivor Costa, MD   2 tablet at 07/27/21 0805   sucralfate (CARAFATE) tablet 1 g  1 g Oral TID WC & HS Ivor Costa, MD   1 g at 07/27/21 0801   tetrahydrozoline 0.05 % ophthalmic solution 1 drop  1 drop Both Eyes Daily PRN Ivor Costa, MD         Discharge Medications: Please see discharge summary for a list of discharge medications.  Relevant Imaging Results:  Relevant Lab Results:   Additional Information SS #: [id number]  [place], LCSW

## 2021-07-27 NOTE — Progress Notes (Signed)
Physical Therapy Treatment Patient Details Name: Bryan Scott MRN: [id number] DOB: 08-Dec-1945 Today's Date: 07/27/2021   History of Present Illness 76 y/o male who came in with L sided weakness and chest pain.  history of of CAD, A-fib on Eliquis with last dose this morning, hypertension, diabetes, hyperlipidemia, and previous strokes who presents for sudden onset of slurred speech, confusion, and left-sided deficits.  Code stroke, imaging is negtive for acute changes.    PT Comments    Pt was long sitting in bed, awake and oriented. He agrees to session and does report feeling much better than previous date. He was able to perform all desired task with less physical assistance required. Improved motor coordination with much less safety concerns overall. He was able to exit bed, stand to RW, and ambulate short distance to recliner. Pt was self limiting during gait training. No knee buckling or ataxic movements in standing today versus previous date. Author encouraged greater gait distances however pt was unwilling. " I don't want to over do it." Lengthy education on importance of continued mobility/there ex/ and repetition of ADLs to improve safety. Pt does not have any assistance available at DC. Author still feels rehab at DC is safest option. Acute PT will continue to follow and progress as able per current POC.   Recommendations for follow up therapy are one component of a multi-disciplinary discharge planning process, led by the attending physician.  Recommendations may be updated based on patient status, additional functional criteria and insurance authorization.  Follow Up Recommendations  Skilled nursing-short term rehab (<3 hours/day)     Assistance Recommended at Discharge Intermittent Supervision/Assistance  Patient can return home with the following A lot of help with walking and/or transfers;A lot of help with bathing/dressing/bathroom;Assistance with cooking/housework;Assistance with  feeding;Assist for transportation;Help with stairs or ramp for entrance   Equipment Recommendations  None recommended by PT       Precautions / Restrictions Precautions Precautions: Fall Restrictions Weight Bearing Restrictions: No     Mobility  Bed Mobility Overal bed mobility: Needs Assistance Bed Mobility: Supine to Sit, Sit to Supine     Supine to sit: Min assist, Mod assist, HOB elevated     General bed mobility comments: Pt requires increased time + min-mod assist to achieve EOB short sit. much improved coodination today versus previous date. Less ataxic movements overall. no knee buckling present during wt bearing activity. Vcs for improved technqiue for getting OOB    Transfers Overall transfer level: Needs assistance Equipment used: Rolling walker (2 wheels) Transfers: Sit to/from Stand Sit to Stand: Min assist, Mod assist           General transfer comment: overall less assistance to stand today. bed height was slightly elevated. no knee buckling observed compared to previous date. Overall pt is progressing quickly.    Ambulation/Gait Ambulation/Gait assistance: Min assist Gait Distance (Feet): 5 Feet Assistive device: Rolling walker (2 wheels) Gait Pattern/deviations: Step-to pattern Gait velocity: decreased     General Gait Details: pt had much improved gait overall. no knee buckling opr safety concern. self limiting however. He was unwilling to ambulate greater distances even with encouragement. " I dont want to over do it."     Balance Overall balance assessment: Needs assistance Sitting-balance support: No upper extremity supported Sitting balance-Leahy Scale: Good     Standing balance support: Bilateral upper extremity supported, Reliant on assistive device for balance Standing balance-Leahy Scale: [place]  Arousal/Alertness: Awake/alert Behavior During Therapy: WFL for tasks assessed/performed Overall Cognitive Status:  Within Functional Limits for tasks assessed      General Comments: Pt is A and O x 4               Pertinent Vitals/Pain Pain Assessment Pain Assessment: No/denies pain Pain Score: 0-No pain     PT Goals (current goals can now be found in the care plan section) Acute Rehab PT Goals Patient Stated Goal: get better then go home Progress towards PT goals: Progressing toward goals    Frequency    Min 2X/week      PT Plan Current plan remains appropriate       AM-PAC PT "6 Clicks" Mobility   Outcome Measure  Help needed turning from your back to your side while in a flat bed without using bedrails?: A Little Help needed moving from lying on your back to sitting on the side of a flat bed without using bedrails?: A Little Help needed moving to and from a bed to a chair (including a wheelchair)?: A Lot Help needed standing up from a chair using your arms (e.g., wheelchair or bedside chair)?: A Lot Help needed to walk in hospital room?: A Lot Help needed climbing 3-5 steps with a railing? : A Lot 6 Click Score: 14    End of Session   Activity Tolerance: Patient tolerated treatment well Patient left: in chair;with call bell/phone within reach;with chair alarm set Nurse Communication: Mobility status PT Visit Diagnosis: Muscle weakness (generalized) (M62.81);Difficulty in walking, not elsewhere classified (R26.2);Unsteadiness on feet (R26.81);Hemiplegia and hemiparesis Hemiplegia - Right/Left: Left Hemiplegia - dominant/non-dominant: Non-dominant Hemiplegia - caused by: Unspecified     Time: 9767-3419 PT Time Calculation (min) (ACUTE ONLY): 27 min  Charges:  $Gait Training: 8-22 mins $Therapeutic Activity: 8-22 mins                    Julaine Fusi PTA 07/27/21, 12:47 PM

## 2021-07-27 NOTE — Progress Notes (Signed)
PROGRESS NOTE    Bryan Scott  FFM:[id number] DOB: Jul 19, 1945 DOA: 07/24/2021 PCP: [name], MD    Brief Narrative:  Bryan Scott is a 76 y.o. male with medical history significant of hypertension, hyperlipidemia, diabetes mellitus, depression, PAF on Eliquis, former smoker, carotid artery stenosis, pancreatitis, dCHF, bladder cancer, who presents with chest pain, left-sided weakness.   Assessment & Plan:   Principal Problem:   TIA (transient ischemic attack) Active Problems:   DM (diabetes mellitus), type 2 with peripheral vascular complications (HCC)   Hyperlipidemia   Essential hypertension   Carotid stenosis   PAF (paroxysmal atrial fibrillation) (HCC)   Hyponatremia   CAD (coronary artery disease)   AKI (acute kidney injury) (HCC)   Chest pain   Chronic diastolic CHF (congestive heart failure) (HCC)   Ascending aortic aneurysm   Lung nodule   Frequent falls   TIA (transient ischemic attack)- (present on admission) Patient has been evaluated by Dr. Rory Percy, MRI of the brain did not show evidence of stroke.  Patient will be continued on Eliquis, Plavix.   No recurrence of symptoms.   Frequent falls Evaluated by PT/OT, pending nursing home placement.   Lung nodule Incidental finding on CT scan, patient be followed by PCP for repeat chest CT scan in 3 to 6 months.   Chest pain- (present on admission) Patient evaluated by cardiology, troponin was normal. Patient has chest pain for the last 3 days, he also had multiple falls over the same period time.  Cardiology does not advise any additional work-up. Chest pain is better today.  AKI (acute kidney injury) ([place])- (present on admission) Hyponatremia. Renal function has normalized.  Sodium level better.   CAD (coronary artery disease)- (present on admission) Continue home medicines.   Hyponatremia- (present on admission) Sodium level has normalized.   PAF (paroxysmal atrial fibrillation) ([place])- (present on  admission) Heart rate is controlled, continue anticoagulation.     DVT prophylaxis: Eliquis Code Status: full Family Communication:  Disposition Plan:    Status is: Observation    I/O last 3 completed shifts: In: -  Out: 2150 [Urine:2150] Total I/O In: 240 [P.O.:240] Out: -     Subjective: Patient is doing well today.  Not additional weakness. Chest pain is much better today.  Not short of breath or cough.  Objective: Vitals:   07/26/21 1923 07/27/21 0006 07/27/21 0448 07/27/21 0733  BP: (!) 113/57 109/79 (!) 145/72 (!) 154/74  Pulse: (!) 55 61 (!) 56 63  Resp: 18 18 18 16   Temp: 97.9 F (36.6 C) (!) 97.5 F (36.4 C) (!) 97.5 F (36.4 C) 98.3 F (36.8 C)  TempSrc: Oral Oral Oral Oral  SpO2: 93% 95% 97% 96%  Weight:      Height:        Intake/Output Summary (Last 24 hours) at 07/27/2021 1126 Last data filed at 07/27/2021 0900 Gross per 24 hour  Intake 240 ml  Output 1550 ml  Net -1310 ml   Filed Weights   07/24/21 2047  Weight: 84.7 kg    Examination:  General exam: Appears calm and comfortable  Respiratory system: Clear to auscultation. Respiratory effort normal. Cardiovascular system: S1 & S2 heard, RRR. No JVD, murmurs, rubs, gallops or clicks. No pedal edema. Gastrointestinal system: Abdomen is nondistended, soft and nontender. No organomegaly or masses felt. Normal bowel sounds heard. Central nervous system: Alert and oriented. No focal neurological deficits. Extremities: Symmetric 5 x 5 power. Skin: No rashes, lesions or ulcers Psychiatry: Judgement  and insight appear normal. Mood & affect appropriate.     Data Reviewed: I have personally reviewed following labs and imaging studies  CBC: Recent Labs  Lab 07/24/21 2048  WBC 7.2  NEUTROABS 3.8  HGB 15.0  HCT 44.1  MCV 96.5  PLT 185   Basic Metabolic Panel: Recent Labs  Lab 07/24/21 2048 07/25/21 0825  NA 131* 135  K 3.6 3.7  CL 101 104  CO2 23 23  GLUCOSE 150* 177*  BUN 20  16  CREATININE 1.26* 1.00  CALCIUM 8.8* 9.0   GFR: Estimated Creatinine Clearance: 69 mL/min (by C-G formula based on SCr of 1 mg/dL). Liver Function Tests: Recent Labs  Lab 07/24/21 2048  AST 18  ALT 21  ALKPHOS 77  BILITOT 1.2  PROT 6.4*  ALBUMIN 3.4*   No results for input(s): LIPASE, AMYLASE in the last 168 hours. No results for input(s): AMMONIA in the last 168 hours. Coagulation Profile: Recent Labs  Lab 07/24/21 2048  INR 1.1   Cardiac Enzymes: No results for input(s): CKTOTAL, CKMB, CKMBINDEX, TROPONINI in the last 168 hours. BNP (last 3 results) No results for input(s): PROBNP in the last 8760 hours. HbA1C: Recent Labs    07/26/21 0401  HGBA1C 7.0*   CBG: Recent Labs  Lab 07/26/21 0756 07/26/21 1143 07/26/21 1616 07/26/21 2058 07/27/21 0733  GLUCAP 165* 186* 232* 246* 191*   Lipid Profile: Recent Labs    07/26/21 0401  CHOL 127  HDL 47  LDLCALC 63  TRIG 85  CHOLHDL 2.7   Thyroid Function Tests: No results for input(s): TSH, T4TOTAL, FREET4, T3FREE, THYROIDAB in the last 72 hours. Anemia Panel: No results for input(s): VITAMINB12, FOLATE, FERRITIN, TIBC, IRON, RETICCTPCT in the last 72 hours. Sepsis Labs: No results for input(s): PROCALCITON, LATICACIDVEN in the last 168 hours.  Recent Results (from the past 240 hour(s))  Resp Panel by RT-PCR (Flu A&B, Covid) Nasopharyngeal Swab     Status: None   Collection Time: 07/24/21  8:48 PM   Specimen: Nasopharyngeal Swab; Nasopharyngeal(NP) swabs in vial transport medium  Result Value Ref Range Status   SARS Coronavirus 2 by RT PCR NEGATIVE NEGATIVE Final    Comment: (NOTE) SARS-CoV-2 target nucleic acids are NOT DETECTED.  The SARS-CoV-2 RNA is generally detectable in upper respiratory specimens during the acute phase of infection. The lowest concentration of SARS-CoV-2 viral copies this assay can detect is 138 copies/mL. A negative result does not preclude SARS-Cov-2 infection and should  not be used as the sole basis for treatment or other patient management decisions. A negative result may occur with  improper specimen collection/handling, submission of specimen other than nasopharyngeal swab, presence of viral mutation(s) within the areas targeted by this assay, and inadequate number of viral copies(<138 copies/mL). A negative result must be combined with clinical observations, patient history, and epidemiological information. The expected result is Negative.  Fact Sheet for Patients:  EntrepreneurPulse.com.au  Fact Sheet for Healthcare Providers:  IncredibleEmployment.be  This test is no t yet approved or cleared by the [place] FDA and  has been authorized for detection and/or diagnosis of SARS-CoV-2 by FDA under an Emergency Use Authorization (EUA). This EUA will remain  in effect (meaning this test can be used) for the duration of the COVID-19 declaration under Section 564(b)(1) of the Act, 21 U.S.C.section 360bbb-3(b)(1), unless the authorization is terminated  or revoked sooner.       Influenza A by PCR NEGATIVE NEGATIVE Final   Influenza B  by PCR NEGATIVE NEGATIVE Final    Comment: (NOTE) The Xpert Xpress SARS-CoV-2/FLU/RSV plus assay is intended as an aid in the diagnosis of influenza from Nasopharyngeal swab specimens and should not be used as a sole basis for treatment. Nasal washings and aspirates are unacceptable for Xpert Xpress SARS-CoV-2/FLU/RSV testing.  Fact Sheet for Patients: EntrepreneurPulse.com.au  Fact Sheet for Healthcare Providers: IncredibleEmployment.be  This test is not yet approved or cleared by the [place] FDA and has been authorized for detection and/or diagnosis of SARS-CoV-2 by FDA under an Emergency Use Authorization (EUA). This EUA will remain in effect (meaning this test can be used) for the duration of the COVID-19 declaration under  Section 564(b)(1) of the Act, 21 U.S.C. section 360bbb-3(b)(1), unless the authorization is terminated or revoked.  Performed at [facility], [address] [place], [place] [address]          Radiology Studies: No results found.      Scheduled Meds:  acetaminophen  650 mg Oral Once   apixaban  5 mg Oral BID   atorvastatin  80 mg Oral QHS   carvedilol  6.25 mg Oral BID WC   clopidogrel  75 mg Oral q morning   DULoxetine  30 mg Oral Daily   ezetimibe  10 mg Oral Daily   feeding supplement (GLUCERNA SHAKE)  237 mL Oral Daily   gabapentin  100 mg Oral TID   insulin aspart  0-5 Units Subcutaneous QHS   insulin aspart  0-9 Units Subcutaneous TID WC   isosorbide mononitrate  60 mg Oral Daily   melatonin  5 mg Oral QHS   multivitamin with minerals  1 tablet Oral Daily   pantoprazole  40 mg Oral Daily   ranolazine  500 mg Oral BID   senna-docusate  2 tablet Oral BID   sucralfate  1 g Oral TID WC & HS   Continuous Infusions:   LOS: 0 days    Time spent: 22 minutes    Sharen Hones, MD [facility]   To contact the attending provider between 7A-7P or the covering provider during after hours 7P-7A, please log into the web site [email or web address] and access using universal [place] password for that web site. If you do not have the password, please call the hospital operator.  07/27/2021, 11:26 AM

## 2021-07-28 DIAGNOSIS — R079 Chest pain, unspecified: Secondary | ICD-10-CM | POA: Diagnosis not present

## 2021-07-28 DIAGNOSIS — N179 Acute kidney failure, unspecified: Secondary | ICD-10-CM | POA: Diagnosis not present

## 2021-07-28 DIAGNOSIS — I7121 Aneurysm of the ascending aorta, without rupture: Secondary | ICD-10-CM | POA: Diagnosis not present

## 2021-07-28 LAB — CBC
HCT: 47.4 % (ref 39.0–52.0)
Hemoglobin: 16.3 g/dL (ref 13.0–17.0)
MCH: 32.4 pg (ref 26.0–34.0)
MCHC: 34.4 g/dL (ref 30.0–36.0)
MCV: 94.2 fL (ref 80.0–100.0)
Platelets: 166 10*3/uL (ref 150–400)
RBC: 5.03 MIL/uL (ref 4.22–5.81)
RDW: 12.8 % (ref 11.5–15.5)
WBC: 9.5 10*3/uL (ref 4.0–10.5)
nRBC: 0 % (ref 0.0–0.2)

## 2021-07-28 LAB — GLUCOSE, CAPILLARY
Glucose-Capillary: 155 mg/dL — ABNORMAL HIGH (ref 70–99)
Glucose-Capillary: 206 mg/dL — ABNORMAL HIGH (ref 70–99)
Glucose-Capillary: 184 mg/dL — ABNORMAL HIGH (ref 70–99)
Glucose-Capillary: 178 mg/dL — ABNORMAL HIGH (ref 70–99)

## 2021-07-28 MED ORDER — LACTULOSE 10 GM/15ML PO SOLN
20.0000 g | Freq: Two times a day (BID) | ORAL | Status: DC | PRN
  Administered 2021-07-28: 13:00:00 20 g via ORAL
  Filled 2021-07-28: qty 30

## 2021-07-28 NOTE — Progress Notes (Addendum)
PROGRESS NOTE    Bryan Scott  ELF:[id number] DOB: [birth year] DOA: 07/24/2021 PCP: [name], MD    Brief Narrative:   Bryan Scott is a 76 y.o. male with medical history significant of hypertension, hyperlipidemia, diabetes mellitus, depression, PAF on Eliquis, former smoker, carotid artery stenosis, pancreatitis, dCHF, bladder cancer, who presents with chest pain, left-sided weakness.  Assessment & Plan:   Principal Problem:   TIA (transient ischemic attack) Active Problems:   DM (diabetes mellitus), type 2 with peripheral vascular complications (HCC)   Hyperlipidemia   Essential hypertension   Carotid stenosis   PAF (paroxysmal atrial fibrillation) (HCC)   Hyponatremia   CAD (coronary artery disease)   AKI (acute kidney injury) (HCC)   Chest pain   Chronic diastolic CHF (congestive heart failure) (HCC)   Ascending aortic aneurysm   Lung nodule   Frequent falls   TIA (transient ischemic attack)- (present on admission) Patient has been evaluated by Dr. Rory Percy, MRI of the brain did not show evidence of stroke.  Patient will be continued on Eliquis, Plavix.   No recurrence of symptoms.   Frequent falls Evaluated by PT/OT, pending nursing home placement.   Lung nodule Incidental finding on CT scan, patient be followed by PCP for repeat chest CT scan in 3 to 6 months.   Chest pain- (present on admission) Ascending aortic aneurysm. Patient evaluated by cardiology, troponin was normal. Patient has chest pain for the last 3 days, he also had multiple falls over the same period time.  Cardiology does not advise any additional work-up. CT angiogram did not show PE or aortic dissection.  But a 4 cm ascending aortic aneurysm was identified.  Patient will be followed by PCP as outpatient. Chest pain seems to be improving today.   AKI (acute kidney injury) ([place])- (present on admission) Hyponatremia. Renal function has normalized.  Sodium level better.   CAD (coronary  artery disease)- (present on admission) Continue home medicines.     PAF (paroxysmal atrial fibrillation) ([place])- (present on admission) Heart rate is controlled, continue anticoagulation.       DVT prophylaxis: Eliquis Code Status: full Family Communication:  Disposition Plan:      Status is: Observation        I/O last 3 completed shifts: In: 59 [P.O.:720] Out: 1075 [Urine:1075] Total I/O In: 240 [P.O.:240] Out: 225 [Urine:225]    Subjective: Patient still complaining some chest pain with deep breath, but overall had improved.  Patient still unsteady on his feet.  Currently pending nursing placement. Patient did feel constipated, has not had a bowel movement since on admission.  We will give lactulose.   Objective: Vitals:   07/28/21 0006 07/28/21 0410 07/28/21 0756 07/28/21 0817  BP: 140/71 140/80 (!) 160/81   Pulse: (!) 55 61 (!) 53   Resp: 18 16 16 18   Temp: 98.7 F (37.1 C) 97.6 F (36.4 C) 97.8 F (36.6 C)   TempSrc:  Oral Oral   SpO2: 95% 95% 96%   Weight:      Height:        Intake/Output Summary (Last 24 hours) at 07/28/2021 1035 Last data filed at 07/28/2021 0900 Gross per 24 hour  Intake 720 ml  Output 925 ml  Net -205 ml   Filed Weights   07/24/21 2047  Weight: 84.7 kg    Examination:  General exam: Appears calm and comfortable  Respiratory system: Clear to auscultation. Respiratory effort normal. Cardiovascular system: S1 & S2 heard, RRR. No JVD,  murmurs, rubs, gallops or clicks. No pedal edema. Gastrointestinal system: Abdomen is nondistended, soft and nontender. No organomegaly or masses felt. Normal bowel sounds heard. Central nervous system: Alert and oriented. No focal neurological deficits. Extremities: Symmetric 5 x 5 power. Skin: No rashes, lesions or ulcers Psychiatry: Judgement and insight appear normal. Mood & affect appropriate.     Data Reviewed: I have personally reviewed following labs and imaging  studies  CBC: Recent Labs  Lab 07/24/21 2048  WBC 7.2  NEUTROABS 3.8  HGB 15.0  HCT 44.1  MCV 96.5  PLT 160   Basic Metabolic Panel: Recent Labs  Lab 07/24/21 2048 07/25/21 0825  NA 131* 135  K 3.6 3.7  CL 101 104  CO2 23 23  GLUCOSE 150* 177*  BUN 20 16  CREATININE 1.26* 1.00  CALCIUM 8.8* 9.0   GFR: Estimated Creatinine Clearance: 69 mL/min (by C-G formula based on SCr of 1 mg/dL). Liver Function Tests: Recent Labs  Lab 07/24/21 2048  AST 18  ALT 21  ALKPHOS 77  BILITOT 1.2  PROT 6.4*  ALBUMIN 3.4*   No results for input(s): LIPASE, AMYLASE in the last 168 hours. No results for input(s): AMMONIA in the last 168 hours. Coagulation Profile: Recent Labs  Lab 07/24/21 2048  INR 1.1   Cardiac Enzymes: No results for input(s): CKTOTAL, CKMB, CKMBINDEX, TROPONINI in the last 168 hours. BNP (last 3 results) No results for input(s): PROBNP in the last 8760 hours. HbA1C: Recent Labs    07/26/21 0401  HGBA1C 7.0*   CBG: Recent Labs  Lab 07/27/21 0733 07/27/21 1142 07/27/21 1550 07/27/21 2131 07/28/21 0756  GLUCAP 191* 262* 237* 192* 155*   Lipid Profile: Recent Labs    07/26/21 0401  CHOL 127  HDL 47  LDLCALC 63  TRIG 85  CHOLHDL 2.7   Thyroid Function Tests: No results for input(s): TSH, T4TOTAL, FREET4, T3FREE, THYROIDAB in the last 72 hours. Anemia Panel: No results for input(s): VITAMINB12, FOLATE, FERRITIN, TIBC, IRON, RETICCTPCT in the last 72 hours. Sepsis Labs: No results for input(s): PROCALCITON, LATICACIDVEN in the last 168 hours.  Recent Results (from the past 240 hour(s))  Resp Panel by RT-PCR (Flu A&B, Covid) Nasopharyngeal Swab     Status: None   Collection Time: 07/24/21  8:48 PM   Specimen: Nasopharyngeal Swab; Nasopharyngeal(NP) swabs in vial transport medium  Result Value Ref Range Status   SARS Coronavirus 2 by RT PCR NEGATIVE NEGATIVE Final    Comment: (NOTE) SARS-CoV-2 target nucleic acids are NOT  DETECTED.  The SARS-CoV-2 RNA is generally detectable in upper respiratory specimens during the acute phase of infection. The lowest concentration of SARS-CoV-2 viral copies this assay can detect is 138 copies/mL. A negative result does not preclude SARS-Cov-2 infection and should not be used as the sole basis for treatment or other patient management decisions. A negative result may occur with  improper specimen collection/handling, submission of specimen other than nasopharyngeal swab, presence of viral mutation(s) within the areas targeted by this assay, and inadequate number of viral copies(<138 copies/mL). A negative result must be combined with clinical observations, patient history, and epidemiological information. The expected result is Negative.  Fact Sheet for Patients:  EntrepreneurPulse.com.au  Fact Sheet for Healthcare Providers:  IncredibleEmployment.be  This test is no t yet approved or cleared by the [place] FDA and  has been authorized for detection and/or diagnosis of SARS-CoV-2 by FDA under an Emergency Use Authorization (EUA). This EUA will remain  in  effect (meaning this test can be used) for the duration of the COVID-19 declaration under Section 564(b)(1) of the Act, 21 U.S.C.section 360bbb-3(b)(1), unless the authorization is terminated  or revoked sooner.       Influenza A by PCR NEGATIVE NEGATIVE Final   Influenza B by PCR NEGATIVE NEGATIVE Final    Comment: (NOTE) The Xpert Xpress SARS-CoV-2/FLU/RSV plus assay is intended as an aid in the diagnosis of influenza from Nasopharyngeal swab specimens and should not be used as a sole basis for treatment. Nasal washings and aspirates are unacceptable for Xpert Xpress SARS-CoV-2/FLU/RSV testing.  Fact Sheet for Patients: EntrepreneurPulse.com.au  Fact Sheet for Healthcare Providers: IncredibleEmployment.be  This test is not yet  approved or cleared by the [place] FDA and has been authorized for detection and/or diagnosis of SARS-CoV-2 by FDA under an Emergency Use Authorization (EUA). This EUA will remain in effect (meaning this test can be used) for the duration of the COVID-19 declaration under Section 564(b)(1) of the Act, 21 U.S.C. section 360bbb-3(b)(1), unless the authorization is terminated or revoked.  Performed at [facility], [address], [place], [place] [address]          Radiology Studies: No results found.      Scheduled Meds:  acetaminophen  650 mg Oral Once   apixaban  5 mg Oral BID   atorvastatin  80 mg Oral QHS   carvedilol  6.25 mg Oral BID WC   clopidogrel  75 mg Oral q morning   DULoxetine  30 mg Oral Daily   ezetimibe  10 mg Oral Daily   feeding supplement (GLUCERNA SHAKE)  237 mL Oral Daily   gabapentin  100 mg Oral TID   insulin aspart  0-5 Units Subcutaneous QHS   insulin aspart  0-9 Units Subcutaneous TID WC   isosorbide mononitrate  60 mg Oral Daily   melatonin  5 mg Oral QHS   multivitamin with minerals  1 tablet Oral Daily   pantoprazole  40 mg Oral Daily   ranolazine  500 mg Oral BID   senna-docusate  2 tablet Oral BID   sucralfate  1 g Oral TID WC & HS   Continuous Infusions:   LOS: 0 days    Time spent: 22 minutes    Sharen Hones, MD [facility]   To contact the attending provider between 7A-7P or the covering provider during after hours 7P-7A, please log into the web site [email or web address] and access using universal [place] password for that web site. If you do not have the password, please call the hospital operator.  07/28/2021, 10:35 AM

## 2021-07-28 NOTE — TOC Progression Note (Signed)
Transition of Care [facility]) - Progression Note    Patient Details  Name: Bryan Scott MRN: [id number] Date of Birth: [birth year]  Transition of Care [facility]) CM/SW [place], LCSW Phone Number: 07/28/2021, 12:07 PM  Clinical Narrative:   Awaiting bed offers.    Expected Discharge Plan: [place] Barriers to Discharge: Continued Medical Work up  Expected Discharge Plan and Services Expected Discharge Plan: [place] arrangements for the past 2 months: Single Family Home                                       Social Determinants of Health (SDOH) Interventions    Readmission Risk Interventions No flowsheet data found.

## 2021-07-29 DIAGNOSIS — R0789 Other chest pain: Secondary | ICD-10-CM

## 2021-07-29 DIAGNOSIS — G459 Transient cerebral ischemic attack, unspecified: Secondary | ICD-10-CM | POA: Diagnosis not present

## 2021-07-29 DIAGNOSIS — N179 Acute kidney failure, unspecified: Secondary | ICD-10-CM | POA: Diagnosis not present

## 2021-07-29 LAB — GLUCOSE, CAPILLARY
Glucose-Capillary: 294 mg/dL — ABNORMAL HIGH (ref 70–99)
Glucose-Capillary: 228 mg/dL — ABNORMAL HIGH (ref 70–99)
Glucose-Capillary: 237 mg/dL — ABNORMAL HIGH (ref 70–99)
Glucose-Capillary: 206 mg/dL — ABNORMAL HIGH (ref 70–99)

## 2021-07-29 MED ORDER — LACTULOSE 10 GM/15ML PO SOLN
20.0000 g | Freq: Two times a day (BID) | ORAL | Status: DC
  Administered 2021-07-29: 10:00:00 20 g via ORAL
  Filled 2021-07-29 (×2): qty 30

## 2021-07-29 MED ORDER — OXYCODONE-ACETAMINOPHEN 5-325 MG PO TABS
1.0000 | ORAL_TABLET | ORAL | Status: DC | PRN
  Administered 2021-07-29 – 2021-08-02 (×16): 1 via ORAL
  Filled 2021-07-29 (×16): qty 1

## 2021-07-29 NOTE — Progress Notes (Signed)
PROGRESS NOTE    Bryan Scott  LKG:[id number] DOB: Sep 23, 1945 DOA: 07/24/2021 PCP: [name], MD   Chief complaint.  Left-sided weakness. Brief Narrative:  Bryan Scott is a 76 y.o. male with medical history significant of hypertension, hyperlipidemia, diabetes mellitus, depression, PAF on Eliquis, former smoker, carotid artery stenosis, pancreatitis, dCHF, bladder cancer, who presents with chest pain, left-sided weakness.   Assessment & Plan:   Principal Problem:   TIA (transient ischemic attack) Active Problems:   DM (diabetes mellitus), type 2 with peripheral vascular complications (HCC)   Hyperlipidemia   Essential hypertension   Carotid stenosis   PAF (paroxysmal atrial fibrillation) (HCC)   Hyponatremia   CAD (coronary artery disease)   AKI (acute kidney injury) (HCC)   Chest pain   Chronic diastolic CHF (congestive heart failure) (HCC)   Ascending aortic aneurysm   Lung nodule   Frequent falls  Constipation. Patient was treated with scheduled lactulose, senna, still has no bowel movement today.  Will give tapwater enema.  Patient has no abdominal pain or nausea vomiting, abdominal examination benign.  TIA (transient ischemic attack)- (present on admission) Patient has been evaluated by Dr. Rory Percy, MRI of the brain did not show evidence of stroke.  Patient will be continued on Eliquis, Plavix.   Condition improved.   Frequent falls Evaluated by PT/OT, pending nursing home placement.   Lung nodule Incidental finding on CT scan, patient be followed by PCP for repeat chest CT scan in 3 to 6 months.   Chest pain- (present on admission) Ascending aortic aneurysm. Patient evaluated by cardiology, troponin was normal. Patient has chest pain for the last 3 days, he also had multiple falls over the same period time.  Cardiology does not advise any additional work-up. CT angiogram did not show PE or aortic dissection.  But a 4 cm ascending aortic aneurysm was  identified.  Patient will be followed by PCP as outpatient. Chest pain seems to be improving today.   AKI (acute kidney injury) ([place])- (present on admission) Hyponatremia. Renal function has normalized.  Sodium level better.   CAD (coronary artery disease)- (present on admission) Continue home medicines.     PAF (paroxysmal atrial fibrillation) ([place])- (present on admission) Heart rate is controlled, continue anticoagulation.       DVT prophylaxis: Eliquis Code Status: full Family Communication:  Disposition Plan:      Status is: Observation       I/O last 3 completed shifts: In: 240 [P.O.:240] Out: 725 [Urine:725] Total I/O In: 240 [P.O.:240] Out: -      Subjective: Patient has not had a bowel movement since admission, took Imodium for laxatives, still has not a bowel movement.  Currently does not have any nausea vomiting abdominal pain. Chest pain much improved.  Objective: Vitals:   07/28/21 2128 07/29/21 0010 07/29/21 0517 07/29/21 0858  BP: 116/68 (!) 112/51 134/72 127/63  Pulse: (!) 57 (!) 52 60 63  Resp: 16 16 16 18   Temp: 98.1 F (36.7 C) 98.2 F (36.8 C) 98.1 F (36.7 C) 97.6 F (36.4 C)  TempSrc:    Oral  SpO2: 96% 97% 96% 97%  Weight:      Height:        Intake/Output Summary (Last 24 hours) at 07/29/2021 1044 Last data filed at 07/29/2021 1010 Gross per 24 hour  Intake 240 ml  Output --  Net 240 ml   Filed Weights   07/24/21 2047  Weight: 84.7 kg    Examination:  General exam: Appears calm and comfortable  Respiratory system: Clear to auscultation. Respiratory effort normal. Cardiovascular system: S1 & S2 heard, RRR. No JVD, murmurs, rubs, gallops or clicks. No pedal edema. Gastrointestinal system: Abdomen is nondistended, soft and nontender. No organomegaly or masses felt. Normal bowel sounds heard. Central nervous system: Alert and oriented. No focal neurological deficits. Extremities: Symmetric 5 x 5 power. Skin: No rashes,  lesions or ulcers Psychiatry: Judgement and insight appear normal. Mood & affect appropriate.     Data Reviewed: I have personally reviewed following labs and imaging studies  CBC: Recent Labs  Lab 07/24/21 2048 07/28/21 1030  WBC 7.2 9.5  NEUTROABS 3.8  --   HGB 15.0 16.3  HCT 44.1 47.4  MCV 96.5 94.2  PLT 160 182   Basic Metabolic Panel: Recent Labs  Lab 07/24/21 2048 07/25/21 0825  NA 131* 135  K 3.6 3.7  CL 101 104  CO2 23 23  GLUCOSE 150* 177*  BUN 20 16  CREATININE 1.26* 1.00  CALCIUM 8.8* 9.0   GFR: Estimated Creatinine Clearance: 69 mL/min (by C-G formula based on SCr of 1 mg/dL). Liver Function Tests: Recent Labs  Lab 07/24/21 2048  AST 18  ALT 21  ALKPHOS 77  BILITOT 1.2  PROT 6.4*  ALBUMIN 3.4*   No results for input(s): LIPASE, AMYLASE in the last 168 hours. No results for input(s): AMMONIA in the last 168 hours. Coagulation Profile: Recent Labs  Lab 07/24/21 2048  INR 1.1   Cardiac Enzymes: No results for input(s): CKTOTAL, CKMB, CKMBINDEX, TROPONINI in the last 168 hours. BNP (last 3 results) No results for input(s): PROBNP in the last 8760 hours. HbA1C: No results for input(s): HGBA1C in the last 72 hours. CBG: Recent Labs  Lab 07/28/21 0756 07/28/21 1227 07/28/21 1641 07/28/21 1948 07/29/21 0858  GLUCAP 155* 206* 184* 178* 294*   Lipid Profile: No results for input(s): CHOL, HDL, LDLCALC, TRIG, CHOLHDL, LDLDIRECT in the last 72 hours. Thyroid Function Tests: No results for input(s): TSH, T4TOTAL, FREET4, T3FREE, THYROIDAB in the last 72 hours. Anemia Panel: No results for input(s): VITAMINB12, FOLATE, FERRITIN, TIBC, IRON, RETICCTPCT in the last 72 hours. Sepsis Labs: No results for input(s): PROCALCITON, LATICACIDVEN in the last 168 hours.  Recent Results (from the past 240 hour(s))  Resp Panel by RT-PCR (Flu A&B, Covid) Nasopharyngeal Swab     Status: None   Collection Time: 07/24/21  8:48 PM   Specimen:  Nasopharyngeal Swab; Nasopharyngeal(NP) swabs in vial transport medium  Result Value Ref Range Status   SARS Coronavirus 2 by RT PCR NEGATIVE NEGATIVE Final    Comment: (NOTE) SARS-CoV-2 target nucleic acids are NOT DETECTED.  The SARS-CoV-2 RNA is generally detectable in upper respiratory specimens during the acute phase of infection. The lowest concentration of SARS-CoV-2 viral copies this assay can detect is 138 copies/mL. A negative result does not preclude SARS-Cov-2 infection and should not be used as the sole basis for treatment or other patient management decisions. A negative result may occur with  improper specimen collection/handling, submission of specimen other than nasopharyngeal swab, presence of viral mutation(s) within the areas targeted by this assay, and inadequate number of viral copies(<138 copies/mL). A negative result must be combined with clinical observations, patient history, and epidemiological information. The expected result is Negative.  Fact Sheet for Patients:  EntrepreneurPulse.com.au  Fact Sheet for Healthcare Providers:  IncredibleEmployment.be  This test is no t yet approved or cleared by the [place] and  has been authorized for detection and/or diagnosis of SARS-CoV-2 by FDA under an Emergency Use Authorization (EUA). This EUA will remain  in effect (meaning this test can be used) for the duration of the COVID-19 declaration under Section 564(b)(1) of the Act, 21 U.S.C.section 360bbb-3(b)(1), unless the authorization is terminated  or revoked sooner.       Influenza A by PCR NEGATIVE NEGATIVE Final   Influenza B by PCR NEGATIVE NEGATIVE Final    Comment: (NOTE) The Xpert Xpress SARS-CoV-2/FLU/RSV plus assay is intended as an aid in the diagnosis of influenza from Nasopharyngeal swab specimens and should not be used as a sole basis for treatment. Nasal washings and aspirates are unacceptable for  Xpert Xpress SARS-CoV-2/FLU/RSV testing.  Fact Sheet for Patients: EntrepreneurPulse.com.au  Fact Sheet for Healthcare Providers: IncredibleEmployment.be  This test is not yet approved or cleared by the [place] FDA and has been authorized for detection and/or diagnosis of SARS-CoV-2 by FDA under an Emergency Use Authorization (EUA). This EUA will remain in effect (meaning this test can be used) for the duration of the COVID-19 declaration under Section 564(b)(1) of the Act, 21 U.S.C. section 360bbb-3(b)(1), unless the authorization is terminated or revoked.  Performed at [facility], [address] [place], [place] [address]          Radiology Studies: No results found.      Scheduled Meds:  acetaminophen  650 mg Oral Once   apixaban  5 mg Oral BID   atorvastatin  80 mg Oral QHS   carvedilol  6.25 mg Oral BID WC   clopidogrel  75 mg Oral q morning   DULoxetine  30 mg Oral Daily   ezetimibe  10 mg Oral Daily   feeding supplement (GLUCERNA SHAKE)  237 mL Oral Daily   gabapentin  100 mg Oral TID   insulin aspart  0-5 Units Subcutaneous QHS   insulin aspart  0-9 Units Subcutaneous TID WC   isosorbide mononitrate  60 mg Oral Daily   lactulose  20 g Oral BID   melatonin  5 mg Oral QHS   multivitamin with minerals  1 tablet Oral Daily   pantoprazole  40 mg Oral Daily   ranolazine  500 mg Oral BID   senna-docusate  2 tablet Oral BID   sucralfate  1 g Oral TID WC & HS   Continuous Infusions:   LOS: 0 days    Time spent: 27 minutes    Sharen Hones, MD [facility]   To contact the attending provider between 7A-7P or the covering provider during after hours 7P-7A, please log into the web site [email or web address] and access using universal [place] password for that web site. If you do not have the password, please call the hospital operator.  07/29/2021, 10:44 AM

## 2021-07-29 NOTE — Progress Notes (Signed)
Physical Therapy Treatment Patient Details Name: Bryan Scott MRN: [id number] DOB: July 14, 1945 Today's Date: 07/29/2021   History of Present Illness 76 y/o male who came in with L sided weakness and chest pain.  history of of CAD, A-fib on Eliquis with last dose this morning, hypertension, diabetes, hyperlipidemia, and previous strokes who presents for sudden onset of slurred speech, confusion, and left-sided deficits.  Code stroke, imaging is negtive for acute changes.    PT Comments    Pt was pleasant and motivated to participate during the session and put forth good effort throughout.  Pt required physical assistance with all functional tasks this session and once in sitting c/o mild dizziness that resolved quickly. Upon standing pt reported increased dizziness and was returned to sitting.  Pt's BP taken in sitting at 165/98 with SpO2 and HR WNL.  Pt's BP then taken in standing with pt able to maintain standing position throughout at 130/83, nsg in room and aware.  Pt's dizziness improved with increased standing activity and pt was able to take several small, effortful steps at the EOB and from bed to chair.  Pt will benefit from PT services in a SNF setting upon discharge to safely address deficits listed in patient problem list for decreased caregiver assistance and eventual return to PLOF.     Recommendations for follow up therapy are one component of a multi-disciplinary discharge planning process, led by the attending physician.  Recommendations may be updated based on patient status, additional functional criteria and insurance authorization.  Follow Up Recommendations  Skilled nursing-short term rehab (<3 hours/day)     Assistance Recommended at Discharge Intermittent Supervision/Assistance  Patient can return home with the following A lot of help with walking and/or transfers;A lot of help with bathing/dressing/bathroom;Assistance with cooking/housework;Assistance with feeding;Assist for  transportation;Help with stairs or ramp for entrance   Equipment Recommendations  None recommended by PT    Recommendations for Other Services       Precautions / Restrictions Precautions Precautions: Fall Restrictions Weight Bearing Restrictions: No     Mobility  Bed Mobility Overal bed mobility: Needs Assistance             General bed mobility comments: Mod A for BLE and trunk control    Transfers Overall transfer level: Needs assistance Equipment used: Rolling walker (2 wheels) Transfers: Sit to/from Stand Sit to Stand: Min assist, Mod assist           General transfer comment: Mod verbal cues for hand placement and increased trunk flexion    Ambulation/Gait Ambulation/Gait assistance: Min assist Gait Distance (Feet): 4 Feet Assistive device: Rolling walker (2 wheels) Gait Pattern/deviations: Step-to pattern, Trunk flexed Gait velocity: decreased     General Gait Details: Tremulous BLE's with Min A for stability and to advance/control the RW   Stairs             Wheelchair Mobility    Modified Rankin (Stroke Patients Only)       Balance Overall balance assessment: Needs assistance   Sitting balance-Leahy Scale: Good     Standing balance support: Bilateral upper extremity supported, Reliant on assistive device for balance, During functional activity Standing balance-Leahy Scale: Poor Standing balance comment: Min A for stability                            Cognition Arousal/Alertness: Awake/alert Behavior During Therapy: WFL for tasks assessed/performed Overall Cognitive Status: Within Functional Limits for tasks  assessed                                          Exercises Total Joint Exercises Ankle Circles/Pumps: AROM, Strengthening, Both, 10 reps Quad Sets: Strengthening, Both, 10 reps Gluteal Sets: Strengthening, Both, 10 reps Long Arc Quad: Strengthening, Both, 15 reps Knee Flexion:  Strengthening, Both, 15 reps Other Exercises Other Exercises: Seated BLE heel raises x 15 Other Exercises: Static standing at EOB x 4 min for increased activity tolerance    General Comments        Pertinent Vitals/Pain Pain Assessment Pain Assessment: 0-10 Pain Score: 7  Pain Location: L side/chest Pain Descriptors / Indicators: Sore Pain Intervention(s): Repositioned, Premedicated before session, Monitored during session, RN gave pain meds during session    Home Living                          Prior Function            PT Goals (current goals can now be found in the care plan section) Progress towards PT goals: PT to reassess next treatment    Frequency    Min 2X/week      PT Plan Current plan remains appropriate    Co-evaluation              AM-PAC PT "6 Clicks" Mobility   Outcome Measure  Help needed turning from your back to your side while in a flat bed without using bedrails?: A Little Help needed moving from lying on your back to sitting on the side of a flat bed without using bedrails?: A Lot Help needed moving to and from a bed to a chair (including a wheelchair)?: A Lot Help needed standing up from a chair using your arms (e.g., wheelchair or bedside chair)?: A Lot Help needed to walk in hospital room?: A Lot Help needed climbing 3-5 steps with a railing? : Total 6 Click Score: 12    End of Session Equipment Utilized During Treatment: Gait belt Activity Tolerance: Patient tolerated treatment well Patient left: in chair;with call bell/phone within reach;with chair alarm set;with nursing/sitter in room Nurse Communication: Mobility status;Other (comment) (dizziness in standing with BP's taken per above) PT Visit Diagnosis: Muscle weakness (generalized) (M62.81);Difficulty in walking, not elsewhere classified (R26.2);Unsteadiness on feet (R26.81);Hemiplegia and hemiparesis Hemiplegia - Right/Left: Left Hemiplegia -  dominant/non-dominant: Non-dominant Hemiplegia - caused by: Unspecified     Time: 7680-8811 PT Time Calculation (min) (ACUTE ONLY): 23 min  Charges:  $Therapeutic Exercise: 8-22 mins $Therapeutic Activity: 8-22 mins                     D. Scott [name] PT, DPT 07/29/21, 9:54 AM

## 2021-07-29 NOTE — TOC Progression Note (Signed)
Transition of Care [facility]) - Progression Note    Patient Details  Name: TASHA JINDRA MRN: [id number] Date of Birth: [birth year]  Transition of Care [facility]) CM/SW [place], RN Phone Number: 07/29/2021, 3:32 PM  Clinical Narrative:   [place] patient with no bed offer at this time.  Sent information to [place] to assess benefits.  Awaiting response from [facility].    Expected Discharge Plan: [place] Barriers to Discharge: Continued Medical Work up  Expected Discharge Plan and Services Expected Discharge Plan: [place] arrangements for the past 2 months: Single Family Home                                       Social Determinants of Health (SDOH) Interventions    Readmission Risk Interventions No flowsheet data found.

## 2021-07-29 NOTE — Progress Notes (Signed)
OT Cancellation Note  Patient Details Name: Bryan Scott MRN: [id number] DOB: 08-16-1945   Cancelled Treatment:    Reason Eval/Treat Not Completed: Patient declined, no reason specified. Pt in recliner, politely but repeatedly declines session and requesting to stay in chair. RN notified. Will re-attempt as able.   Dessie Coma, M.S. OTR/L  07/29/21, 2:41 PM  ascom [phone]

## 2021-07-30 DIAGNOSIS — N179 Acute kidney failure, unspecified: Secondary | ICD-10-CM | POA: Diagnosis not present

## 2021-07-30 DIAGNOSIS — G459 Transient cerebral ischemic attack, unspecified: Secondary | ICD-10-CM | POA: Diagnosis not present

## 2021-07-30 DIAGNOSIS — R0789 Other chest pain: Secondary | ICD-10-CM | POA: Diagnosis not present

## 2021-07-30 LAB — GLUCOSE, CAPILLARY
Glucose-Capillary: 241 mg/dL — ABNORMAL HIGH (ref 70–99)
Glucose-Capillary: 269 mg/dL — ABNORMAL HIGH (ref 70–99)
Glucose-Capillary: 239 mg/dL — ABNORMAL HIGH (ref 70–99)
Glucose-Capillary: 293 mg/dL — ABNORMAL HIGH (ref 70–99)

## 2021-07-30 MED ORDER — INSULIN GLARGINE-YFGN 100 UNIT/ML ~~LOC~~ SOLN
8.0000 [IU] | Freq: Every day | SUBCUTANEOUS | Status: DC
  Administered 2021-07-30 – 2021-08-02 (×4): 8 [IU] via SUBCUTANEOUS
  Filled 2021-07-30 (×5): qty 0.08

## 2021-07-30 MED ORDER — LACTULOSE 10 GM/15ML PO SOLN: 20.0000 g | Freq: Two times a day (BID) | ORAL | Status: DC | PRN

## 2021-07-30 NOTE — TOC Progression Note (Signed)
Transition of Care [facility]) - Progression Note    Patient Details  Name: BRIEN LOWE MRN: [id number] Date of Birth: April 05, 1946  Transition of Care [facility]) CM/SW [place], RN Phone Number: 07/30/2021, 3:49 PM  Clinical Narrative:   Damaris Schooner to [employer], [place], states that she will need a packet of information and clinical prior to approving SNF.  Patient has no bed offers at this time.  Patient states he is in agreement with rehabilitation at this time and he would like information sent to [place], as he lives alone and has no family or assistance at home.  Patient has no transportation or medication concerns at this time.  Patient cut conversation short due to feeling  nauseated. TOC contact information provided.  TOC will follow.    Expected Discharge Plan: [place] Barriers to Discharge: Continued Medical Work up  Expected Discharge Plan and Services Expected Discharge Plan: [place] arrangements for the past 2 months: Single Family Home                                       Social Determinants of Health (SDOH) Interventions    Readmission Risk Interventions No flowsheet data found.

## 2021-07-30 NOTE — Progress Notes (Signed)
PROGRESS NOTE    Bryan Scott  WUX:[id number] DOB: 11-05-1945 DOA: 07/24/2021 PCP: [name], MD    Brief Narrative:  Bryan Scott is a 76 y.o. male with medical history significant of hypertension, hyperlipidemia, diabetes mellitus, depression, PAF on Eliquis, former smoker, carotid artery stenosis, pancreatitis, dCHF, bladder cancer, who presents with chest pain, left-sided weakness.   Assessment & Plan:   Principal Problem:   TIA (transient ischemic attack) Active Problems:   DM (diabetes mellitus), type 2 with peripheral vascular complications (HCC)   Hyperlipidemia   Essential hypertension   Carotid stenosis   PAF (paroxysmal atrial fibrillation) (HCC)   Hyponatremia   CAD (coronary artery disease)   AKI (acute kidney injury) (HCC)   Chest pain   Chronic diastolic CHF (congestive heart failure) (HCC)   Ascending aortic aneurysm   Lung nodule   Frequent falls  Constipation. Had a large bowel movement after giving 2 doses of lactulose.  Condition resolved.   TIA (transient ischemic attack)- (present on admission) Patient has been evaluated by Dr. Rory Percy, MRI of the brain did not show evidence of stroke.  Patient will be continued on Eliquis, Plavix.   No recurrence, pending nursing home placement.   Frequent falls Generalized weakness. Continue PT/OT.   Lung nodule Incidental finding on CT scan, patient be followed by PCP for repeat chest CT scan in 3 to 6 months.   Chest pain- (present on admission) Ascending aortic aneurysm. Patient evaluated by cardiology, troponin was normal. Patient has chest pain for the last 3 days, he also had multiple falls over the same period time.  Cardiology does not advise any additional work-up. CT angiogram did not show PE or aortic dissection.  But a 4 cm ascending aortic aneurysm was identified.  Patient will be followed by PCP as outpatient. Chest pain much improved.   AKI (acute kidney injury) ([place])- (present on  admission) Hyponatremia. Renal function has normalized.  Sodium level better.   CAD (coronary artery disease)- (present on admission) No additional event.     PAF (paroxysmal atrial fibrillation) ([place])- (present on admission) On Eliquis.   Uncontrolled type 2 diabetes with hyperglycemia Continue sliding scale insulin, added a lower dose insulin glargine.   DVT prophylaxis: Eliquis Code Status: full Family Communication:  Disposition Plan:      Status is: Observation        I/O last 3 completed shifts: In: 480 [P.O.:480] Out: 730 [Urine:730] No intake/output data recorded.     Consultants:  None  Procedures: None  Antimicrobials: None  Subjective: Patient doing well today.  Chest pain essentially resolved.  No weakness today. Had a large bowel movement after stool softener.  No nausea vomiting abdominal pain.  Objective: Vitals:   07/29/21 1627 07/29/21 2023 07/30/21 0523 07/30/21 0744  BP: 123/72 139/66 (!) 156/83 (!) 167/78  Pulse: (!) 53 (!) 52 (!) 58 (!) 55  Resp: 16 16 18 20   Temp: 97.7 F (36.5 C) 98.1 F (36.7 C) 98 F (36.7 C) 97.9 F (36.6 C)  TempSrc: Oral Oral    SpO2: 98% 96% 97% 98%  Weight:      Height:        Intake/Output Summary (Last 24 hours) at 07/30/2021 1005 Last data filed at 07/30/2021 0119 Gross per 24 hour  Intake 480 ml  Output 730 ml  Net -250 ml   Filed Weights   07/24/21 2047  Weight: 84.7 kg    Examination:  General exam: Appears calm and comfortable  Respiratory system: Clear to auscultation. Respiratory effort normal. Cardiovascular system: S1 & S2 heard, RRR. No JVD, murmurs, rubs, gallops or clicks. No pedal edema. Gastrointestinal system: Abdomen is nondistended, soft and nontender. No organomegaly or masses felt. Normal bowel sounds heard. Central nervous system: Alert and oriented. No focal neurological deficits. Extremities: Symmetric 5 x 5 power. Skin: No rashes, lesions or ulcers Psychiatry:  Judgement and insight appear normal. Mood & affect appropriate.     Data Reviewed: I have personally reviewed following labs and imaging studies  CBC: Recent Labs  Lab 07/24/21 2048 07/28/21 1030  WBC 7.2 9.5  NEUTROABS 3.8  --   HGB 15.0 16.3  HCT 44.1 47.4  MCV 96.5 94.2  PLT 160 539   Basic Metabolic Panel: Recent Labs  Lab 07/24/21 2048 07/25/21 0825  NA 131* 135  K 3.6 3.7  CL 101 104  CO2 23 23  GLUCOSE 150* 177*  BUN 20 16  CREATININE 1.26* 1.00  CALCIUM 8.8* 9.0   GFR: Estimated Creatinine Clearance: 69 mL/min (by C-G formula based on SCr of 1 mg/dL). Liver Function Tests: Recent Labs  Lab 07/24/21 2048  AST 18  ALT 21  ALKPHOS 77  BILITOT 1.2  PROT 6.4*  ALBUMIN 3.4*   No results for input(s): LIPASE, AMYLASE in the last 168 hours. No results for input(s): AMMONIA in the last 168 hours. Coagulation Profile: Recent Labs  Lab 07/24/21 2048  INR 1.1   Cardiac Enzymes: No results for input(s): CKTOTAL, CKMB, CKMBINDEX, TROPONINI in the last 168 hours. BNP (last 3 results) No results for input(s): PROBNP in the last 8760 hours. HbA1C: No results for input(s): HGBA1C in the last 72 hours. CBG: Recent Labs  Lab 07/29/21 0858 07/29/21 1221 07/29/21 1628 07/29/21 2103 07/30/21 0819  GLUCAP 294* 228* 237* 206* 241*   Lipid Profile: No results for input(s): CHOL, HDL, LDLCALC, TRIG, CHOLHDL, LDLDIRECT in the last 72 hours. Thyroid Function Tests: No results for input(s): TSH, T4TOTAL, FREET4, T3FREE, THYROIDAB in the last 72 hours. Anemia Panel: No results for input(s): VITAMINB12, FOLATE, FERRITIN, TIBC, IRON, RETICCTPCT in the last 72 hours. Sepsis Labs: No results for input(s): PROCALCITON, LATICACIDVEN in the last 168 hours.  Recent Results (from the past 240 hour(s))  Resp Panel by RT-PCR (Flu A&B, Covid) Nasopharyngeal Swab     Status: None   Collection Time: 07/24/21  8:48 PM   Specimen: Nasopharyngeal Swab; Nasopharyngeal(NP)  swabs in vial transport medium  Result Value Ref Range Status   SARS Coronavirus 2 by RT PCR NEGATIVE NEGATIVE Final    Comment: (NOTE) SARS-CoV-2 target nucleic acids are NOT DETECTED.  The SARS-CoV-2 RNA is generally detectable in upper respiratory specimens during the acute phase of infection. The lowest concentration of SARS-CoV-2 viral copies this assay can detect is 138 copies/mL. A negative result does not preclude SARS-Cov-2 infection and should not be used as the sole basis for treatment or other patient management decisions. A negative result may occur with  improper specimen collection/handling, submission of specimen other than nasopharyngeal swab, presence of viral mutation(s) within the areas targeted by this assay, and inadequate number of viral copies(<138 copies/mL). A negative result must be combined with clinical observations, patient history, and epidemiological information. The expected result is Negative.  Fact Sheet for Patients:  EntrepreneurPulse.com.au  Fact Sheet for Healthcare Providers:  IncredibleEmployment.be  This test is no t yet approved or cleared by the [place] FDA and  has been authorized for detection and/or diagnosis  of SARS-CoV-2 by FDA under an Emergency Use Authorization (EUA). This EUA will remain  in effect (meaning this test can be used) for the duration of the COVID-19 declaration under Section 564(b)(1) of the Act, 21 U.S.C.section 360bbb-3(b)(1), unless the authorization is terminated  or revoked sooner.       Influenza A by PCR NEGATIVE NEGATIVE Final   Influenza B by PCR NEGATIVE NEGATIVE Final    Comment: (NOTE) The Xpert Xpress SARS-CoV-2/FLU/RSV plus assay is intended as an aid in the diagnosis of influenza from Nasopharyngeal swab specimens and should not be used as a sole basis for treatment. Nasal washings and aspirates are unacceptable for Xpert Xpress  SARS-CoV-2/FLU/RSV testing.  Fact Sheet for Patients: EntrepreneurPulse.com.au  Fact Sheet for Healthcare Providers: IncredibleEmployment.be  This test is not yet approved or cleared by the [place] FDA and has been authorized for detection and/or diagnosis of SARS-CoV-2 by FDA under an Emergency Use Authorization (EUA). This EUA will remain in effect (meaning this test can be used) for the duration of the COVID-19 declaration under Section 564(b)(1) of the Act, 21 U.S.C. section 360bbb-3(b)(1), unless the authorization is terminated or revoked.  Performed at [facility], [address], [place], [place] [address]          Radiology Studies: No results found.      Scheduled Meds:  acetaminophen  650 mg Oral Once   apixaban  5 mg Oral BID   atorvastatin  80 mg Oral QHS   carvedilol  6.25 mg Oral BID WC   clopidogrel  75 mg Oral q morning   DULoxetine  30 mg Oral Daily   ezetimibe  10 mg Oral Daily   feeding supplement (GLUCERNA SHAKE)  237 mL Oral Daily   gabapentin  100 mg Oral TID   insulin aspart  0-5 Units Subcutaneous QHS   insulin aspart  0-9 Units Subcutaneous TID WC   isosorbide mononitrate  60 mg Oral Daily   lactulose  20 g Oral BID   melatonin  5 mg Oral QHS   multivitamin with minerals  1 tablet Oral Daily   pantoprazole  40 mg Oral Daily   ranolazine  500 mg Oral BID   senna-docusate  2 tablet Oral BID   sucralfate  1 g Oral TID WC & HS   Continuous Infusions:   LOS: 0 days    Time spent: 26 minutes    Sharen Hones, MD [facility]   To contact the attending provider between 7A-7P or the covering provider during after hours 7P-7A, please log into the web site [email or web address] and access using universal [place] password for that web site. If you do not have the password, please call the hospital operator.  07/30/2021, 10:05 AM

## 2021-07-30 NOTE — Progress Notes (Signed)
OT Cancellation Note  Patient Details Name: Bryan Scott MRN: [id number] DOB: [birth year]   Cancelled Treatment:    Reason Eval/Treat Not Completed: Medical issues which prohibited therapy. Upon attempt, pt received in recliner, endorsing severe nausea. RN notified per pt's request for something to address it. Pt agreeable to re-attempting OT tx at later date/time once feeling better.   Ardeth Perfect., MPH, MS, OTR/L ascom [phone] 07/30/21, 3:45 PM

## 2021-07-30 NOTE — Progress Notes (Signed)
Physical Therapy Treatment Patient Details Name: Bryan Scott MRN: [id number] DOB: Sep 19, 1945 Today's Date: 07/30/2021   History of Present Illness Pt is a 76 y/o male who came in with L sided weakness and chest pain.  history of of CAD, A-fib on Eliquis with last dose this morning, hypertension, diabetes, hyperlipidemia, and previous strokes who presents for sudden onset of slurred speech, confusion, and left-sided deficits.  Code stroke, imaging is negtive for acute changes.    PT Comments    Pt was pleasant and very motivated to participate during the session and put forth good effort throughout. Pt required grossly less physical assistance with functional tasks during the session and made good progress towards goals.  Pt was steadier with gait with less BLE trembling and was able to increase max amb tolerance to 2 x 10 feet.  Pt reported mild dizziness in standing that resolved quickly with SpO2 and HR WNL.  Pt will benefit from PT services in a SNF setting upon discharge to safely address deficits listed in patient problem list for decreased caregiver assistance and eventual return to PLOF.     Recommendations for follow up therapy are one component of a multi-disciplinary discharge planning process, led by the attending physician.  Recommendations may be updated based on patient status, additional functional criteria and insurance authorization.  Follow Up Recommendations  Skilled nursing-short term rehab (<3 hours/day)     Assistance Recommended at Discharge Intermittent Supervision/Assistance  Patient can return home with the following A lot of help with walking and/or transfers;A lot of help with bathing/dressing/bathroom;Assistance with cooking/housework;Assistance with feeding;Assist for transportation;Help with stairs or ramp for entrance   Equipment Recommendations  None recommended by PT    Recommendations for Other Services       Precautions / Restrictions  Precautions Precautions: Fall Restrictions Weight Bearing Restrictions: No     Mobility  Bed Mobility Overal bed mobility: Needs Assistance Bed Mobility: Supine to Sit           General bed mobility comments: Min A for BLE and trunk control    Transfers Overall transfer level: Needs assistance Equipment used: Rolling walker (2 wheels) Transfers: Sit to/from Stand Sit to Stand: Min assist           General transfer comment: Min verbal cues for hand placement and increased trunk flexion    Ambulation/Gait Ambulation/Gait assistance: Min guard Gait Distance (Feet): 10 Feet x 2 Assistive device: Rolling walker (2 wheels) Gait Pattern/deviations: Step-through pattern, Decreased step length - right, Decreased step length - left, Trunk flexed Gait velocity: decreased     General Gait Details: Mildly tremulous BLE's but grossly improved compared to prior session.   Stairs             Wheelchair Mobility    Modified Rankin (Stroke Patients Only)       Balance Overall balance assessment: Needs assistance Sitting-balance support: No upper extremity supported Sitting balance-Leahy Scale: Good     Standing balance support: Bilateral upper extremity supported, Reliant on assistive device for balance, During functional activity Standing balance-Leahy Scale: Fair                              Cognition Arousal/Alertness: Awake/alert Behavior During Therapy: WFL for tasks assessed/performed Overall Cognitive Status: Within Functional Limits for tasks assessed  Exercises Total Joint Exercises Ankle Circles/Pumps: AROM, Strengthening, Both, 10 reps (with resistance) Quad Sets: Strengthening, Both, 10 reps Gluteal Sets: Strengthening, Both, 10 reps Heel Slides: Strengthening, Both, 5 reps Hip ABduction/ADduction: Strengthening, Both, 5 reps Long Arc Quad: Strengthening, Both, 10 reps Knee  Flexion: Strengthening, Both, 10 reps Bridges: Strengthening, Both, 5 reps    General Comments        Pertinent Vitals/Pain Pain Assessment Pain Score: 7  Pain Location: L side/chest Pain Descriptors / Indicators: Sore Pain Intervention(s): Repositioned, Premedicated before session, Monitored during session    Home Living                          Prior Function            PT Goals (current goals can now be found in the care plan section) Progress towards PT goals: Progressing toward goals    Frequency    Min 2X/week      PT Plan Current plan remains appropriate    Co-evaluation              AM-PAC PT "6 Clicks" Mobility   Outcome Measure  Help needed turning from your back to your side while in a flat bed without using bedrails?: A Little Help needed moving from lying on your back to sitting on the side of a flat bed without using bedrails?: A Little Help needed moving to and from a bed to a chair (including a wheelchair)?: A Little Help needed standing up from a chair using your arms (e.g., wheelchair or bedside chair)?: A Little Help needed to walk in hospital room?: A Lot Help needed climbing 3-5 steps with a railing? : A Lot 6 Click Score: 16    End of Session Equipment Utilized During Treatment: Gait belt Activity Tolerance: Patient tolerated treatment well Patient left: in chair;with call bell/phone within reach;with chair alarm set Nurse Communication: Mobility status PT Visit Diagnosis: Muscle weakness (generalized) (M62.81);Difficulty in walking, not elsewhere classified (R26.2);Unsteadiness on feet (R26.81);Hemiplegia and hemiparesis Hemiplegia - Right/Left: Left Hemiplegia - dominant/non-dominant: Non-dominant Hemiplegia - caused by: Unspecified     Time: 8416-6063 PT Time Calculation (min) (ACUTE ONLY): 28 min  Charges:  $Gait Training: 8-22 mins $Therapeutic Exercise: 8-22 mins                     D. Scott Palmira Stickle PT,  DPT 07/30/21, 1:42 PM

## 2021-07-31 DIAGNOSIS — I214 Non-ST elevation (NSTEMI) myocardial infarction: Secondary | ICD-10-CM | POA: Diagnosis not present

## 2021-07-31 DIAGNOSIS — I2510 Atherosclerotic heart disease of native coronary artery without angina pectoris: Secondary | ICD-10-CM | POA: Diagnosis present

## 2021-07-31 DIAGNOSIS — R071 Chest pain on breathing: Secondary | ICD-10-CM | POA: Diagnosis present

## 2021-07-31 DIAGNOSIS — E1165 Type 2 diabetes mellitus with hyperglycemia: Secondary | ICD-10-CM | POA: Diagnosis present

## 2021-07-31 DIAGNOSIS — I480 Paroxysmal atrial fibrillation: Secondary | ICD-10-CM | POA: Diagnosis not present

## 2021-07-31 DIAGNOSIS — R911 Solitary pulmonary nodule: Secondary | ICD-10-CM

## 2021-07-31 DIAGNOSIS — R296 Repeated falls: Secondary | ICD-10-CM | POA: Diagnosis present

## 2021-07-31 DIAGNOSIS — E871 Hypo-osmolality and hyponatremia: Secondary | ICD-10-CM | POA: Diagnosis not present

## 2021-07-31 DIAGNOSIS — N179 Acute kidney failure, unspecified: Secondary | ICD-10-CM | POA: Diagnosis not present

## 2021-07-31 DIAGNOSIS — Z20822 Contact with and (suspected) exposure to covid-19: Secondary | ICD-10-CM | POA: Diagnosis present

## 2021-07-31 DIAGNOSIS — F32A Depression, unspecified: Secondary | ICD-10-CM | POA: Diagnosis present

## 2021-07-31 DIAGNOSIS — Z95828 Presence of other vascular implants and grafts: Secondary | ICD-10-CM | POA: Diagnosis not present

## 2021-07-31 DIAGNOSIS — Z8673 Personal history of transient ischemic attack (TIA), and cerebral infarction without residual deficits: Secondary | ICD-10-CM | POA: Diagnosis not present

## 2021-07-31 DIAGNOSIS — Z87891 Personal history of nicotine dependence: Secondary | ICD-10-CM | POA: Diagnosis not present

## 2021-07-31 DIAGNOSIS — Z955 Presence of coronary angioplasty implant and graft: Secondary | ICD-10-CM | POA: Diagnosis not present

## 2021-07-31 DIAGNOSIS — G459 Transient cerebral ischemic attack, unspecified: Secondary | ICD-10-CM | POA: Diagnosis not present

## 2021-07-31 DIAGNOSIS — E785 Hyperlipidemia, unspecified: Secondary | ICD-10-CM | POA: Diagnosis present

## 2021-07-31 DIAGNOSIS — I110 Hypertensive heart disease with heart failure: Secondary | ICD-10-CM | POA: Diagnosis present

## 2021-07-31 DIAGNOSIS — R531 Weakness: Secondary | ICD-10-CM

## 2021-07-31 DIAGNOSIS — M6281 Muscle weakness (generalized): Secondary | ICD-10-CM | POA: Diagnosis not present

## 2021-07-31 DIAGNOSIS — Z951 Presence of aortocoronary bypass graft: Secondary | ICD-10-CM | POA: Diagnosis not present

## 2021-07-31 DIAGNOSIS — I7121 Aneurysm of the ascending aorta, without rupture: Secondary | ICD-10-CM | POA: Diagnosis present

## 2021-07-31 DIAGNOSIS — I5032 Chronic diastolic (congestive) heart failure: Secondary | ICD-10-CM | POA: Diagnosis not present

## 2021-07-31 DIAGNOSIS — I200 Unstable angina: Secondary | ICD-10-CM | POA: Diagnosis present

## 2021-07-31 DIAGNOSIS — E1151 Type 2 diabetes mellitus with diabetic peripheral angiopathy without gangrene: Secondary | ICD-10-CM | POA: Diagnosis not present

## 2021-07-31 DIAGNOSIS — E1143 Type 2 diabetes mellitus with diabetic autonomic (poly)neuropathy: Secondary | ICD-10-CM | POA: Diagnosis present

## 2021-07-31 DIAGNOSIS — G8194 Hemiplegia, unspecified affecting left nondominant side: Secondary | ICD-10-CM | POA: Diagnosis not present

## 2021-07-31 LAB — GLUCOSE, CAPILLARY
Glucose-Capillary: 182 mg/dL — ABNORMAL HIGH (ref 70–99)
Glucose-Capillary: 246 mg/dL — ABNORMAL HIGH (ref 70–99)
Glucose-Capillary: 215 mg/dL — ABNORMAL HIGH (ref 70–99)
Glucose-Capillary: 223 mg/dL — ABNORMAL HIGH (ref 70–99)

## 2021-07-31 NOTE — Progress Notes (Signed)
Progress Note   Patient: Bryan Scott MGQ:[id number] DOB: 1945-09-04 DOA: 07/24/2021     0 DOS: the patient was seen and examined on 07/31/2021   Brief hospital course: Bryan Scott is a 76 y.o. male with medical history significant of hypertension, hyperlipidemia, diabetes mellitus, depression, PAF on Eliquis, former smoker, carotid artery stenosis, pancreatitis, dCHF, bladder cancer, who presents with chest pain, left-sided weakness.     Assessment and Plan: * Generalized weakness Can go out to rehab when bed available.  Transitional care team working on things.  TIA (transient ischemic attack)- (present on admission) Patient has been evaluated by Dr. Rory Percy.  MRI of the brain did not show evidence of stroke.  Patient will be continued on Eliquis, Plavix.    AKI (acute kidney injury) ([place])- (present on admission) Creatinine peaked at 1.26.  Last creatinine 1.0.  Hyponatremia- (present on admission) Last sodium 135  PAF (paroxysmal atrial fibrillation) ([place])- (present on admission) Continue Eliquis for anticoagulation to prevent stroke.  Ascending aortic aneurysm- (present on admission) 4 cm ascending aortic aneurysm.  Radiologist recommends annual imaging.  Lung nodule Incidental finding on CT angio of the neck.  2.5 cm pleural-based irregular soft tissue density along the medial aspect of the left upper lobe.  This was not commented on CT scan of the chest.  Follow-up with PCP as outpatient.  Can have a repeat CT scan of the chest in 3 months or PET CT scan.  Chronic diastolic CHF (congestive heart failure) ([place])- (present on admission) No current signs of heart failure.  Last echocardiogram showed an EF of 70 to 75%  Hyperlipidemia- (present on admission) Continue high-dose Lipitor  Essential hypertension- (present on admission) Blood pressure stable  Chest pain- (present on admission) Patient evaluated by cardiology, troponin was normal. Patient has chest pain for the last  3 days, he also had multiple falls over the same period time.  Cardiology does not advise any additional work-up.  CAD (coronary artery disease)- (present on admission) Chest pain is not consistent with cardiac origin.  Continue home medicines.  DM (diabetes mellitus), type 2 with peripheral vascular complications ([place])- (present on admission) Patient on low-dose Semglee insulin with sliding scale.        Subjective: Patient feeling well.  Getting anxious about being here.  Wants to get out of the hospital as quickly as possible.  Initially admitted with weakness and physical therapy recommending rehab  Physical Exam: Vitals:   07/31/21 0009 07/31/21 0549 07/31/21 0738 07/31/21 1120  BP: 114/73 (!) 169/95 (!) 139/117 132/78  Pulse: (!) 48 64 63 (!) 54  Resp: 16 16 16 16   Temp: 97.7 F (36.5 C) 97.6 F (36.4 C) 98.4 F (36.9 C) 97.6 F (36.4 C)  TempSrc: Oral Oral Oral Oral  SpO2: 98% 97%  98%  Weight:      Height:       Physical Exam HENT:     Head: Normocephalic.     Mouth/Throat:     Pharynx: No oropharyngeal exudate.  Eyes:     General: Lids are normal.     Conjunctiva/sclera: Conjunctivae normal.  Cardiovascular:     Rate and Rhythm: Normal rate and regular rhythm.     Heart sounds: Normal heart sounds, S1 normal and S2 normal.  Pulmonary:     Breath sounds: Normal breath sounds. No decreased breath sounds, wheezing, rhonchi or rales.  Abdominal:     Palpations: Abdomen is soft.     Tenderness: There is no  abdominal tenderness.  Musculoskeletal:     Right lower leg: No swelling.     Left lower leg: No swelling.  Skin:    General: Skin is warm.     Findings: No rash.  Neurological:     Mental Status: He is alert and oriented to person, place, and time.     Comments: Able to straight leg raise.     Data Reviewed: Laboratory radiological data reviewed during hospital course  Disposition: Status is: Inpatient Remains inpatient appropriate because:  Physical therapy recommending rehab and transitional care team looking into options.  The patient states he has Medicare, [employer] and [facility] benefits.  Planned Discharge Destination: Rehab when bed available  Author: Loletha Grayer, MD 07/31/2021 3:41 PM  For on call review [email or web address].

## 2021-07-31 NOTE — TOC Progression Note (Signed)
Transition of Care [facility]) - Progression Note    Patient Details  Name: Bryan Scott MRN: [id number] Date of Birth: 1946/02/14  [facility]) CM/SW [place], RN Phone Number: 07/31/2021, 12:35 PM  Clinical Narrative:   Patient has [employer] and [place].  He states he would like to go to [facility].  [facility] is in network and is looking into authorization.  TOC to follow.    Expected Discharge Plan: [place] Barriers to Discharge: Continued Medical Work up  Expected Discharge Plan and Services Expected Discharge Plan: [place] arrangements for the past 2 months: Single Family Home                                       Social Determinants of Health (SDOH) Interventions    Readmission Risk Interventions No flowsheet data found.

## 2021-07-31 NOTE — Assessment & Plan Note (Signed)
Continue high-dose Lipitor

## 2021-07-31 NOTE — Assessment & Plan Note (Signed)
No current signs of heart failure.  Last echocardiogram showed an EF of 70 to 75%

## 2021-07-31 NOTE — Assessment & Plan Note (Signed)
4 cm ascending aortic aneurysm.  Radiologist recommends annual imaging.

## 2021-07-31 NOTE — Assessment & Plan Note (Addendum)
Can go out to rehab when bed available.  Still awaiting insurance authorization

## 2021-07-31 NOTE — Assessment & Plan Note (Signed)
Blood pressure stable ? ?

## 2021-07-31 NOTE — Progress Notes (Signed)
Need a rehab bed

## 2021-07-31 NOTE — Progress Notes (Signed)
Occupational Therapy Treatment Patient Details Name: Bryan Scott MRN: [id number] DOB: [birth year] Today's Date: 07/31/2021   History of present illness Pt is a 76 y/o male who came in with L sided weakness and chest pain.  history of of CAD, A-fib on Eliquis with last dose this morning, hypertension, diabetes, hyperlipidemia, and previous strokes who presents for sudden onset of slurred speech, confusion, and left-sided deficits.  Code stroke, imaging is negtive for acute changes.   OT comments  Bryan Scott seen for OT treatment on this date. Upon arrival to room pt awake/alert seated upright in recliner. He endorses moderate L flank pain, but is agreeable to OT tx session and eager to perform functional mobility in room. He is able to perform STS x2 during session progressing toward MIN guard. He performs functional mobility for 61' x2 during session with CGA. Seated UB grooming as described below. Pt educated on safety, falls prevention, and energy conservation t/o session. Pt verbalized understanding. Pt making good progress toward goals and continues to benefit from skilled OT services to maximize return to PLOF and minimize risk of future falls, injury, caregiver burden, and readmission. Will continue to follow POC. Discharge recommendation remains appropriate.     Recommendations for follow up therapy are one component of a multi-disciplinary discharge planning process, led by the attending physician.  Recommendations may be updated based on patient status, additional functional criteria and insurance authorization.    Follow Up Recommendations  Skilled nursing-short term rehab (<3 hours/day)    Assistance Recommended at Discharge Intermittent Supervision/Assistance  Patient can return home with the following  A lot of help with walking and/or transfers;A lot of help with bathing/dressing/bathroom;Assistance with cooking/housework;Assist for transportation;Help with stairs or ramp for  entrance;Direct supervision/assist for medications management   Equipment Recommendations  BSC/3in1    Recommendations for Other Services      Precautions / Restrictions Precautions Precautions: Fall Restrictions Weight Bearing Restrictions: No       Mobility Bed Mobility Overal bed mobility: Needs Assistance             General bed mobility comments: Deferred. Pt in crecliner at start/end of session.    Transfers Overall transfer level: Needs assistance Equipment used: Rolling walker (2 wheels) Transfers: Sit to/from Stand Sit to Stand: Min assist, Min guard           General transfer comment: Min verbal cues for hand placement and increased trunk flexion     Balance Overall balance assessment: Needs assistance Sitting-balance support: No upper extremity supported Sitting balance-Leahy Scale: Good     Standing balance support: Bilateral upper extremity supported, Reliant on assistive device for balance, During functional activity Standing balance-Leahy Scale: Fair Standing balance comment: Min A for stability                           ADL either performed or assessed with clinical judgement   ADL Overall ADL's : Needs assistance/impaired     Grooming: Sitting;Wash/dry hands;Wash/dry face;Oral care;Set up;Supervision/safety                               Functional mobility during ADLs: Min guard;Rolling walker (2 wheels) General ADL Comments: Pt performs functional mobility in room with close CGA for safety. Attempted standing grooming, but requries seated rest break and ultimately performs oral care and face washing while seated EOB.    Extremity/Trunk Assessment  Vision Patient Visual Report: No change from baseline     Perception     Praxis      Cognition Arousal/Alertness: Awake/alert Behavior During Therapy: WFL for tasks assessed/performed Overall Cognitive Status: Within Functional Limits for  tasks assessed                                          Exercises Other Exercises Other Exercises: OT facilitated functional mobility, standing/seated grooming tasks as described above. See ADL section for additional detail. Pt educated on safety, falls prevention, and safe use of AE/DME for ADL management t/o session.    Shoulder Instructions       General Comments      Pertinent Vitals/ Pain       Pain Assessment Pain Assessment: Faces Faces Pain Scale: Hurts a little bit Pain Location: L side/chest Pain Descriptors / Indicators: Sore Pain Intervention(s): Repositioned, Limited activity within patient's tolerance, Monitored during session  Home Living                                          Prior Functioning/Environment              Frequency  Min 3X/week        Progress Toward Goals  OT Goals(current goals can now be found in the care plan section)  Progress towards OT goals: Progressing toward goals  Acute Rehab OT Goals Patient Stated Goal: To get stronger at rehab. OT Goal Formulation: With patient Time For Goal Achievement: 08/08/21 Potential to Achieve Goals: Good  Plan Discharge plan remains appropriate;Frequency remains appropriate    Co-evaluation                 AM-PAC OT "6 Clicks" Daily Activity     Outcome Measure   Help from another person eating meals?: None Help from another person taking care of personal grooming?: A Little Help from another person toileting, which includes using toliet, bedpan, or urinal?: A Lot Help from another person bathing (including washing, rinsing, drying)?: A Lot Help from another person to put on and taking off regular upper body clothing?: A Little Help from another person to put on and taking off regular lower body clothing?: A Lot 6 Click Score: 16    End of Session Equipment Utilized During Treatment: Gait belt;Rolling walker (2 wheels)  OT Visit Diagnosis:  Repeated falls (R29.6);Hemiplegia and hemiparesis;History of falling (Z91.81) Hemiplegia - Right/Left: Left Hemiplegia - dominant/non-dominant: Non-Dominant Hemiplegia - caused by: Unspecified   Activity Tolerance Patient tolerated treatment well   Patient Left with call bell/phone within reach;in chair;with chair alarm set   Nurse Communication Mobility status        Time: 1583-0940 OT Time Calculation (min): 24 min  Charges: OT General Charges $OT Visit: 1 Visit OT Treatments $Self Care/Home Management : 23-37 mins  Shara Blazing, M.S., OTR/L [facility] Ascom: [phone] 07/31/21, 1:02 PM

## 2021-07-31 NOTE — Assessment & Plan Note (Addendum)
Last hemoglobin A1c 7.0 we will restart Jardiance and Glucophage as outpatient.  During the hospital stay was on low-dose Semglee insulin 8 units and sliding scale but will discontinue that since going back on the oral medications

## 2021-08-01 DIAGNOSIS — N179 Acute kidney failure, unspecified: Secondary | ICD-10-CM | POA: Diagnosis not present

## 2021-08-01 DIAGNOSIS — G459 Transient cerebral ischemic attack, unspecified: Secondary | ICD-10-CM | POA: Diagnosis not present

## 2021-08-01 DIAGNOSIS — E871 Hypo-osmolality and hyponatremia: Secondary | ICD-10-CM | POA: Diagnosis not present

## 2021-08-01 DIAGNOSIS — R531 Weakness: Secondary | ICD-10-CM | POA: Diagnosis not present

## 2021-08-01 LAB — GLUCOSE, CAPILLARY
Glucose-Capillary: 198 mg/dL — ABNORMAL HIGH (ref 70–99)
Glucose-Capillary: 241 mg/dL — ABNORMAL HIGH (ref 70–99)
Glucose-Capillary: 361 mg/dL — ABNORMAL HIGH (ref 70–99)
Glucose-Capillary: 173 mg/dL — ABNORMAL HIGH (ref 70–99)

## 2021-08-01 MED ORDER — DIPHENHYDRAMINE HCL 25 MG PO CAPS
25.0000 mg | ORAL_CAPSULE | Freq: Four times a day (QID) | ORAL | Status: DC | PRN
  Administered 2021-08-01: 21:00:00 25 mg via ORAL
  Filled 2021-08-01: qty 1

## 2021-08-01 NOTE — Progress Notes (Signed)
Progress Note   Patient: Bryan Scott WER:[id number] DOB: July 02, 1945 DOA: 07/24/2021     1 DOS: the patient was seen and examined on 08/01/2021   Brief hospital course: Bryan Scott is a 76 y.o. male with medical history significant of hypertension, hyperlipidemia, diabetes mellitus, depression, PAF on Eliquis, former smoker, carotid artery stenosis, pancreatitis, dCHF, bladder cancer, who presents with chest pain, left-sided weakness.     Assessment and Plan: * Generalized weakness Can go out to rehab when bed available.  Facility trying to work on [employer].  TIA (transient ischemic attack)- (present on admission) Patient still has left-sided weakness.  Patient has been evaluated by Dr. Rory Percy.  MRI of the brain did not show evidence of stroke.  Patient will be continued on Eliquis, Plavix.    AKI (acute kidney injury) ([place])- (present on admission) Creatinine peaked at 1.26.  Last creatinine 1.0.  Hyponatremia- (present on admission) Last sodium 135  PAF (paroxysmal atrial fibrillation) ([place])- (present on admission) Continue Eliquis for anticoagulation to prevent stroke.  Ascending aortic aneurysm- (present on admission) 4 cm ascending aortic aneurysm.  Radiologist recommends annual imaging.  Lung nodule Incidental finding on CT angio of the neck.  2.5 cm pleural-based irregular soft tissue density along the medial aspect of the left upper lobe.  This was not commented on CT scan of the chest.  Follow-up with PCP as outpatient.  Can have a repeat CT scan of the chest in 3 months or PET CT scan.  Chronic diastolic CHF (congestive heart failure) ([place])- (present on admission) No current signs of heart failure.  Last echocardiogram showed an EF of 70 to 75%  Hyperlipidemia- (present on admission) Continue high-dose Lipitor  Essential hypertension- (present on admission) Blood pressure stable  Chest pain- (present on admission) Patient evaluated by cardiology,  troponin was normal. Patient has chest pain for the last 3 days, he also had multiple falls over the same period time.  Cardiology does not advise any additional work-up.  CAD (coronary artery disease)- (present on admission) Chest pain is not consistent with cardiac origin.  Continue home medicines.  DM (diabetes mellitus), type 2 with peripheral vascular complications ([place])- (present on admission) Patient on low-dose Semglee insulin with sliding scale.        Subjective: Patient upset he that he is in the hospital so long.  Wanting to get out to rehab.  Physical Exam: Vitals:   07/31/21 1621 07/31/21 2014 08/01/21 0018 08/01/21 0754  BP: 124/70 134/72 (!) 147/75 (!) 160/79  Pulse: (!) 56 (!) 51 (!) 56 (!) 51  Resp: 18 18 18 16   Temp: (!) 97.4 F (36.3 C) 98 F (36.7 C) 98.2 F (36.8 C) 97.7 F (36.5 C)  TempSrc: Oral   Oral  SpO2: 94% 100% 98% 98%  Weight:      Height:       Physical Exam HENT:     Head: Normocephalic.     Mouth/Throat:     Pharynx: No oropharyngeal exudate.  Eyes:     General: Lids are normal.     Conjunctiva/sclera: Conjunctivae normal.  Cardiovascular:     Rate and Rhythm: Normal rate and regular rhythm.     Heart sounds: Normal heart sounds, S1 normal and S2 normal.  Pulmonary:     Breath sounds: Normal breath sounds. No decreased breath sounds, wheezing, rhonchi or rales.  Abdominal:     Palpations: Abdomen is soft.     Tenderness: There is no abdominal tenderness.  Musculoskeletal:     Right lower leg: No swelling.     Left lower leg: No swelling.  Skin:    General: Skin is warm.     Findings: No rash.  Neurological:     Mental Status: He is alert and oriented to person, place, and time.     Comments: Able to straight leg raise.  Power 4+ out of 5 left side, 5 out of 5 right side.     Data Reviewed:  There are no new results to review at this time.  Family Communication: Declined  Disposition: Status is: Inpatient Remains  inpatient appropriate because: Awaiting insurance authorization to go out to rehab.  Medically stable.  Planned Discharge Destination: Rehab   Author: Loletha Grayer, MD 08/01/2021 3:03 PM  For on call review [email or web address].

## 2021-08-01 NOTE — Plan of Care (Signed)
°  Problem: Health Behavior/Discharge Planning: Goal: Ability to manage health-related needs will improve 08/01/2021 0140 by Liliane Channel, RN Outcome: Progressing 08/01/2021 0137 by Liliane Channel, RN Outcome: Progressing   Problem: Clinical Measurements: Goal: Respiratory complications will improve 08/01/2021 0140 by Liliane Channel, RN Outcome: Progressing 08/01/2021 0137 by Liliane Channel, RN Outcome: Progressing   Problem: Clinical Measurements: Goal: Cardiovascular complication will be avoided 08/01/2021 0140 by Liliane Channel, RN Outcome: Progressing 08/01/2021 0137 by Liliane Channel, RN Outcome: Progressing   Problem: Pain Managment: Goal: General experience of comfort will improve 08/01/2021 0140 by Liliane Channel, RN Outcome: Progressing 08/01/2021 0137 by Liliane Channel, RN Outcome: Progressing   Problem: Safety: Goal: Ability to remain free from injury will improve 08/01/2021 0140 by Liliane Channel, RN Outcome: Progressing 08/01/2021 0137 by Liliane Channel, RN Outcome: Progressing   Problem: Education: Goal: Knowledge of secondary prevention will improve (SELECT ALL) Outcome: Progressing   Problem: Education: Goal: Knowledge of patient specific risk factors will improve (INDIVIDUALIZE FOR PATIENT) Outcome: Progressing

## 2021-08-01 NOTE — Progress Notes (Signed)
Occupational Therapy Treatment Patient Details Name: Bryan Scott MRN: [id number] DOB: [birth year] Today's Date: 08/01/2021   History of present illness Pt is a 76 y/o male who came in with L sided weakness and chest pain.  history of of CAD, A-fib on Eliquis with last dose this morning, hypertension, diabetes, hyperlipidemia, and previous strokes who presents for sudden onset of slurred speech, confusion, and left-sided deficits.  Code stroke, imaging is negtive for acute changes.   OT comments  Bryan Scott was seen for OT treatment on this date. Upon arrival to room pt reclined in chair, agreeable to tx. Pt requires MIN A + RW sit<>stand x3 trials. Pt completed 5 sets single leg stance for 5 sec each side, buckling noted in LLE. Seated AAROM BUE exercises as described below. Pt making good progress toward goals. Pt continues to benefit from skilled OT services to maximize return to PLOF and minimize risk of future falls, injury, caregiver burden, and readmission. Will continue to follow POC. Discharge recommendation remains appropriate.     Recommendations for follow up therapy are one component of a multi-disciplinary discharge planning process, led by the attending physician.  Recommendations may be updated based on patient status, additional functional criteria and insurance authorization.    Follow Up Recommendations  Skilled nursing-short term rehab (<3 hours/day)    Assistance Recommended at Discharge Intermittent Supervision/Assistance  Patient can return home with the following  A lot of help with walking and/or transfers;A lot of help with bathing/dressing/bathroom;Assistance with cooking/housework;Assist for transportation;Help with stairs or ramp for entrance;Direct supervision/assist for medications management   Equipment Recommendations  BSC/3in1    Recommendations for Other Services      Precautions / Restrictions Precautions Precautions: Fall Restrictions Weight Bearing  Restrictions: No       Mobility Bed Mobility               General bed mobility comments: received and left in chair    Transfers Overall transfer level: Needs assistance Equipment used: Rolling walker (2 wheels) Transfers: Sit to/from Stand Sit to Stand: Min assist                 Balance Overall balance assessment: Needs assistance Sitting-balance support: No upper extremity supported Sitting balance-Leahy Scale: Good     Standing balance support: During functional activity, Single extremity supported Standing balance-Leahy Scale: Fair                             ADL either performed or assessed with clinical judgement   ADL Overall ADL's : Needs assistance/impaired                                       General ADL Comments: MIN A + RW for ADL t/f      Cognition Arousal/Alertness: Awake/alert Behavior During Therapy: WFL for tasks assessed/performed Overall Cognitive Status: Within Functional Limits for tasks assessed                                          Exercises Exercises: Other exercises, General Upper Extremity General Exercises - Upper Extremity Shoulder Flexion: AAROM, Strengthening, Both, 10 reps, Seated Shoulder Extension: AAROM, Strengthening, Both, 10 reps, Seated Shoulder ABduction: AAROM, Strengthening, Both, 10 reps, Seated Shoulder  ADduction: AAROM, Strengthening, Both, 10 reps, Seated Elbow Flexion: AAROM, Strengthening, Both, 10 reps, Seated Elbow Extension: AAROM, Strengthening, Both, 10 reps, Seated Other Exercises Other Exercises: Pt completed 5 sets single leg stance for 5 sec each side, buckling noted in LLE     Pertinent Vitals/ Pain       Pain Assessment Pain Assessment: 0-10 Pain Score: 6  Pain Location: left flank Pain Descriptors / Indicators: Sore Pain Intervention(s): Limited activity within patient's tolerance, Repositioned   Frequency  Min 3X/week         Progress Toward Goals  OT Goals(current goals can now be found in the care plan section)  Progress towards OT goals: Progressing toward goals  Acute Rehab OT Goals Patient Stated Goal: to get better OT Goal Formulation: With patient Time For Goal Achievement: 08/08/21 Potential to Achieve Goals: Good ADL Goals Pt Will Perform Lower Body Dressing: sit to/from stand;with supervision Pt Will Transfer to Toilet: with min guard assist;ambulating Pt Will Perform Toileting - Clothing Manipulation and hygiene: with min guard assist;sit to/from stand Additional ADL Goal #1: Pt will perform seated bath with remote supervision  Plan Discharge plan remains appropriate;Frequency remains appropriate    Co-evaluation                 AM-PAC OT "6 Clicks" Daily Activity     Outcome Measure   Help from another person eating meals?: None Help from another person taking care of personal grooming?: A Little Help from another person toileting, which includes using toliet, bedpan, or urinal?: A Lot Help from another person bathing (including washing, rinsing, drying)?: A Lot Help from another person to put on and taking off regular upper body clothing?: A Little Help from another person to put on and taking off regular lower body clothing?: A Lot 6 Click Score: 16    End of Session    OT Visit Diagnosis: Repeated falls (R29.6);Hemiplegia and hemiparesis;History of falling (Z91.81) Hemiplegia - Right/Left: Left Hemiplegia - dominant/non-dominant: Non-Dominant Hemiplegia - caused by: Unspecified   Activity Tolerance Patient tolerated treatment well   Patient Left in chair;with call bell/phone within reach;with chair alarm set   Nurse Communication Mobility status        Time: 8377-9396 OT Time Calculation (min): 10 min  Charges: OT General Charges $OT Visit: 1 Visit OT Treatments $Therapeutic Exercise: 8-22 mins  Dessie Coma, M.S. OTR/L  08/01/21, 4:28 PM  ascom  [phone]

## 2021-08-01 NOTE — Progress Notes (Signed)
Physical Therapy Treatment Patient Details Name: Bryan Scott MRN: [id number] DOB: 1946/04/29 Today's Date: 08/01/2021   History of Present Illness Pt is a 76 y/o male who came in with L sided weakness and chest pain.  history of of CAD, A-fib on Eliquis with last dose this morning, hypertension, diabetes, hyperlipidemia, and previous strokes who presents for sudden onset of slurred speech, confusion, and left-sided deficits.  Code stroke, imaging is negtive for acute changes.    PT Comments    Patient is agreeable to PT. He participated with LE exercises for strengthening in bed. Patient continues to require assistance with bed mobility and stand step transfer from bed to chair. Limited overall activity tolerance. Patient is not safe for return home at this time and SNF is recommended for short term rehab at discharge. Recommend to continue PT to maximize independence and facilitate return to prior level of function.    Recommendations for follow up therapy are one component of a multi-disciplinary discharge planning process, led by the attending physician.  Recommendations may be updated based on patient status, additional functional criteria and insurance authorization.  Follow Up Recommendations  Skilled nursing-short term rehab (<3 hours/day)     Assistance Recommended at Discharge Intermittent Supervision/Assistance  Patient can return home with the following A lot of help with walking and/or transfers;A lot of help with bathing/dressing/bathroom;Assistance with cooking/housework;Assistance with feeding;Assist for transportation;Help with stairs or ramp for entrance   Equipment Recommendations  None recommended by PT    Recommendations for Other Services       Precautions / Restrictions Precautions Precautions: Fall Restrictions Weight Bearing Restrictions: No     Mobility  Bed Mobility Overal bed mobility: Needs Assistance Bed Mobility: Supine to Sit     Supine to sit:  Min assist     General bed mobility comments: assistance of trunk support. verbal cues for technique    Transfers Overall transfer level: Needs assistance Equipment used: Rolling walker (2 wheels) Transfers: Bed to chair/wheelchair/BSC Sit to Stand: Min assist           General transfer comment: verbal cues for technique. narrow base of support with step transfer. patient declined using rolling walker however he likely would benefit from using for support.    Ambulation/Gait               General Gait Details: not assessed as patient just wanting to get to the chair at this time   Stairs             Wheelchair Mobility    Modified Rankin (Stroke Patients Only)       Balance                                            Cognition Arousal/Alertness: Awake/alert Behavior During Therapy: WFL for tasks assessed/performed Overall Cognitive Status: Within Functional Limits for tasks assessed                                 General Comments: patient able to follow all commands without difficulty        Exercises Total Joint Exercises Ankle Circles/Pumps: AAROM, Strengthening, Both, 10 reps, Supine Heel Slides: AAROM, Strengthening, Both, 10 reps, Supine Hip ABduction/ADduction: AAROM, Strengthening, Both, 10 reps, Supine Straight Leg Raises: AAROM, Strengthening, Both, 10 reps, Supine  Other Exercises Other Exercises: verbal cues for exercise technique for strengthening    General Comments        Pertinent Vitals/Pain Pain Assessment Pain Assessment: 0-10 Pain Score: 7  Pain Location: left flank Pain Descriptors / Indicators: Sore Pain Intervention(s): Premedicated before session, Limited activity within patient's tolerance, Monitored during session    Home Living                          Prior Function            PT Goals (current goals can now be found in the care plan section) Acute Rehab PT  Goals Patient Stated Goal: to be strong enough to return home PT Goal Formulation: With patient Time For Goal Achievement: 08/08/21 Potential to Achieve Goals: Fair Progress towards PT goals: Progressing toward goals    Frequency    Min 2X/week      PT Plan Current plan remains appropriate    Co-evaluation              AM-PAC PT "6 Clicks" Mobility   Outcome Measure  Help needed turning from your back to your side while in a flat bed without using bedrails?: A Little Help needed moving from lying on your back to sitting on the side of a flat bed without using bedrails?: A Little Help needed moving to and from a bed to a chair (including a wheelchair)?: A Little Help needed standing up from a chair using your arms (e.g., wheelchair or bedside chair)?: A Little Help needed to walk in hospital room?: A Lot Help needed climbing 3-5 steps with a railing? : A Lot 6 Click Score: 16    End of Session   Activity Tolerance: Patient tolerated treatment well Patient left: in chair;with call bell/phone within reach;with chair alarm set Nurse Communication: Mobility status PT Visit Diagnosis: Muscle weakness (generalized) (M62.81);Difficulty in walking, not elsewhere classified (R26.2);Unsteadiness on feet (R26.81);Hemiplegia and hemiparesis Hemiplegia - Right/Left: Left Hemiplegia - dominant/non-dominant: Non-dominant Hemiplegia - caused by: Unspecified     Time: 9983-3825 PT Time Calculation (min) (ACUTE ONLY): 24 min  Charges:  $Therapeutic Exercise: 8-22 mins $Therapeutic Activity: 8-22 mins                     Bryan Scott, PT, MPT    Bryan Scott 08/01/2021, 11:58 AM

## 2021-08-01 NOTE — TOC Progression Note (Signed)
Transition of Care [facility]) - Progression Note    Patient Details  Name: Bryan Scott MRN: [id number] Date of Birth: [birth year]  [facility] CM/SW [place], RN Phone Number: 08/01/2021, 9:45 AM  Clinical Narrative:   Patient elects to use [employer] and [employer] to go to [facility].  Alphia Kava aware and obtaining authorization for [employer].  Awaiting authorization to transfer patient to SNF    Expected Discharge Plan: [place] Barriers to Discharge: Continued Medical Work up  Expected Discharge Plan and Services Expected Discharge Plan: [place] arrangements for the past 2 months: Single Family Home                                       Social Determinants of Health (SDOH) Interventions    Readmission Risk Interventions No flowsheet data found.

## 2021-08-01 NOTE — Plan of Care (Deleted)
  Problem: Health Behavior/Discharge Planning: Goal: Ability to manage health-related needs will improve Outcome: Progressing   Problem: Clinical Measurements: Goal: Respiratory complications will improve Outcome: Progressing   Problem: Clinical Measurements: Goal: Cardiovascular complication will be avoided Outcome: Progressing   Problem: Pain Managment: Goal: General experience of comfort will improve Outcome: Progressing   Problem: Safety: Goal: Ability to remain free from injury will improve Outcome: Progressing   

## 2021-08-02 DIAGNOSIS — I214 Non-ST elevation (NSTEMI) myocardial infarction: Secondary | ICD-10-CM | POA: Diagnosis not present

## 2021-08-02 DIAGNOSIS — M6281 Muscle weakness (generalized): Secondary | ICD-10-CM | POA: Diagnosis not present

## 2021-08-02 DIAGNOSIS — R531 Weakness: Secondary | ICD-10-CM | POA: Diagnosis not present

## 2021-08-02 DIAGNOSIS — I7121 Aneurysm of the ascending aorta, without rupture: Secondary | ICD-10-CM | POA: Diagnosis not present

## 2021-08-02 DIAGNOSIS — I5032 Chronic diastolic (congestive) heart failure: Secondary | ICD-10-CM | POA: Diagnosis not present

## 2021-08-02 DIAGNOSIS — G459 Transient cerebral ischemic attack, unspecified: Secondary | ICD-10-CM | POA: Diagnosis not present

## 2021-08-02 DIAGNOSIS — I480 Paroxysmal atrial fibrillation: Secondary | ICD-10-CM | POA: Diagnosis not present

## 2021-08-02 DIAGNOSIS — G8194 Hemiplegia, unspecified affecting left nondominant side: Secondary | ICD-10-CM | POA: Diagnosis not present

## 2021-08-02 DIAGNOSIS — E1151 Type 2 diabetes mellitus with diabetic peripheral angiopathy without gangrene: Secondary | ICD-10-CM | POA: Diagnosis not present

## 2021-08-02 LAB — RESP PANEL BY RT-PCR (FLU A&B, COVID) ARPGX2
Influenza A by PCR: NEGATIVE
Influenza B by PCR: NEGATIVE
SARS Coronavirus 2 by RT PCR: NEGATIVE

## 2021-08-02 LAB — GLUCOSE, CAPILLARY
Glucose-Capillary: 239 mg/dL — ABNORMAL HIGH (ref 70–99)
Glucose-Capillary: 203 mg/dL — ABNORMAL HIGH (ref 70–99)
Glucose-Capillary: 233 mg/dL — ABNORMAL HIGH (ref 70–99)

## 2021-08-02 MED ORDER — INSULIN ASPART 100 UNIT/ML IJ SOLN
2.0000 [IU] | Freq: Three times a day (TID) | INTRAMUSCULAR | Status: DC
  Administered 2021-08-02: 17:00:00 2 [IU] via SUBCUTANEOUS
  Filled 2021-08-02: qty 1

## 2021-08-02 MED ORDER — ISOSORBIDE MONONITRATE ER 60 MG PO TB24: 60.0000 mg | ORAL_TABLET | Freq: Every day | ORAL | 0 refills | Status: AC

## 2021-08-02 MED ORDER — ACETAMINOPHEN 325 MG PO TABS: 650.0000 mg | ORAL_TABLET | ORAL | Status: AC | PRN

## 2021-08-02 MED ORDER — METFORMIN HCL 500 MG PO TABS: 500.0000 mg | ORAL_TABLET | Freq: Every day | ORAL | Status: AC

## 2021-08-02 MED ORDER — DIPHENHYDRAMINE HCL 25 MG PO CAPS: 25.0000 mg | ORAL_CAPSULE | Freq: Four times a day (QID) | ORAL | 0 refills | Status: DC | PRN

## 2021-08-02 MED ORDER — GABAPENTIN 100 MG PO CAPS: 100.0000 mg | ORAL_CAPSULE | Freq: Three times a day (TID) | ORAL | Status: DC

## 2021-08-02 MED ORDER — GLUCERNA SHAKE PO LIQD: 237.0000 mL | Freq: Every day | ORAL | 0 refills | Status: AC

## 2021-08-02 NOTE — Progress Notes (Signed)
Attempt made at calling report to [facility]. No answer on [place]

## 2021-08-02 NOTE — Progress Notes (Signed)
Occupational Therapy Treatment Patient Details Name: Bryan Scott MRN: [id number] DOB: 02-21-1946 Today's Date: 08/02/2021   History of present illness Pt is a 76 y/o male who came in with L sided weakness and chest pain.  history of of CAD, A-fib on Eliquis with last dose this morning, hypertension, diabetes, hyperlipidemia, and previous strokes who presents for sudden onset of slurred speech, confusion, and left-sided deficits.  Code stroke, imaging is negtive for acute changes.   OT comments  Bryan Scott was seen for OT treatment on this date. Upon arrival to room pt reclined in bed, reporting nausea however agreeable to tx. Pt given green theraputty (provided) and completed exercises with good carry over of technique while seated EOB. Pt worked on gross grip loop, lateral pinch, 3pt. pinch, gross digit extension, digit extension table spread, digit abduction loop, single digit extension loop, and thumb opposition. Pt making good progress toward goals. Will continue to follow POC. Discharge recommendation remains appropriate.     Recommendations for follow up therapy are one component of a multi-disciplinary discharge planning process, led by the attending physician.  Recommendations may be updated based on patient status, additional functional criteria and insurance authorization.    Follow Up Recommendations  Skilled nursing-short term rehab (<3 hours/day)    Assistance Recommended at Discharge Intermittent Supervision/Assistance  Patient can return home with the following  A lot of help with walking and/or transfers;A lot of help with bathing/dressing/bathroom;Assistance with cooking/housework;Assist for transportation;Help with stairs or ramp for entrance;Direct supervision/assist for medications management   Equipment Recommendations  BSC/3in1    Recommendations for Other Services      Precautions / Restrictions Precautions Precautions: Fall Restrictions Weight Bearing Restrictions:  No       Mobility Bed Mobility Overal bed mobility: Modified Independent                  Transfers                   General transfer comment: deferred citing nausea     Balance Overall balance assessment: Needs assistance Sitting-balance support: No upper extremity supported, Feet supported Sitting balance-Leahy Scale: Good                                     ADL either performed or assessed with clinical judgement   ADL Overall ADL's : Needs assistance/impaired                                       General ADL Comments: Tolerates ~10 min sitting no UE suport      Cognition Arousal/Alertness: Awake/alert Behavior During Therapy: WFL for tasks assessed/performed Overall Cognitive Status: Within Functional Limits for tasks assessed                                          Exercises Other Exercises Other Exercises: seated LUE theraputty exercises (green provided) including 3 pt pinch, lateral pinch, lumbricals    Pertinent Vitals/ Pain       Pain Assessment Pain Assessment: No/denies pain   Frequency  Min 3X/week        Progress Toward Goals  OT Goals(current goals can now be found in the care plan section)  Progress towards OT goals: Progressing toward goals  Acute Rehab OT Goals Patient Stated Goal: to return to PLOF OT Goal Formulation: With patient Time For Goal Achievement: 08/08/21 Potential to Achieve Goals: Good ADL Goals Pt Will Perform Lower Body Dressing: sit to/from stand;with supervision Pt Will Transfer to Toilet: with min guard assist;ambulating Pt Will Perform Toileting - Clothing Manipulation and hygiene: with min guard assist;sit to/from stand Additional ADL Goal #1: Pt will perform seated bath with remote supervision  Plan Discharge plan remains appropriate;Frequency remains appropriate    Co-evaluation                 AM-PAC OT "6 Clicks" Daily Activity      Outcome Measure   Help from another person eating meals?: None Help from another person taking care of personal grooming?: A Little Help from another person toileting, which includes using toliet, bedpan, or urinal?: A Lot Help from another person bathing (including washing, rinsing, drying)?: A Lot Help from another person to put on and taking off regular upper body clothing?: A Little Help from another person to put on and taking off regular lower body clothing?: A Lot 6 Click Score: 16    End of Session    OT Visit Diagnosis: Repeated falls (R29.6);Hemiplegia and hemiparesis;History of falling (Z91.81) Hemiplegia - Right/Left: Left Hemiplegia - dominant/non-dominant: Non-Dominant Hemiplegia - caused by: Unspecified   Activity Tolerance Patient tolerated treatment well   Patient Left in bed;with call bell/phone within reach   Nurse Communication Mobility status        Time: 4801-6553 OT Time Calculation (min): 14 min  Charges: OT General Charges $OT Visit: 1 Visit OT Treatments $Therapeutic Exercise: 8-22 mins  Dessie Coma, M.S. OTR/L  08/02/21, 3:29 PM  ascom [phone]

## 2021-08-02 NOTE — Progress Notes (Signed)
Inpatient Diabetes Program Recommendations  AACE/ADA: New Consensus Statement on Inpatient Glycemic Control (2015)  Target Ranges:  Prepandial:   less than 140 mg/dL      Peak postprandial:   less than 180 mg/dL (1-2 hours)      Critically ill patients:  140 - 180 mg/dL   Lab Results  Component Value Date   GLUCAP 239 (H) 08/02/2021   HGBA1C 7.0 (H) 07/26/2021    Review of Glycemic Control  Latest Reference Range & Units 07/31/21 11:21 07/31/21 16:21 07/31/21 21:18 08/01/21 08:27 08/01/21 11:33 08/01/21 17:57 08/01/21 20:22 08/02/21 08:18  Glucose-Capillary 70 - 99 mg/dL 246 (H) 215 (H) 223 (H) 198 (H) 241 (H) 361 (H) 173 (H) 239 (H)  (H): Data is abnormally high  Diabetes history: DM2 Outpatient Diabetes medications: Jardiance 25 mg QD + Metformin 500 QD  Current orders for Inpatient glycemic control: Semglee 8 units qd, Novolog 0-9 units correction tid, 0-5 units hs  Inpatient Diabetes Program Recommendations:   While orals held: -Add Novolog 2 units tid meal coverage if eats 50%  Thank you, Nani Gasser. Nana Vastine, RN, MSN, CDE  Diabetes Coordinator Inpatient Glycemic Control Team Team Pager [phone] (8am-5pm) 08/02/2021 10:56 AM

## 2021-08-02 NOTE — TOC Progression Note (Addendum)
Transition of Care [facility]) - Progression Note    Patient Details  Name: SERAPHIM TROW MRN: [id number] Date of Birth: October 07, 1945  Transition [facility]) CM/SW [place], LCSW Phone Number: 08/02/2021, 9:07 AM  Clinical Narrative:   CSW reached out to [name] at [facility] to inquire about insurance auth status. Asked to be notified when she gets [place].  3:05- Attempted call to [name] at [facility]. No answer.  3:44- Call from [facility] [employer]) Representative with authorization. Auth # [id number], good 2/17-2/24. Called Merchant navy officer at [facility]. No answer. Called Tiffany, no longer employed with [employer] Called [facility] main # x 2.  Spoke with Receptionist who says [name] is with a family member and will call CSW back shortly.   4:00- Call from [name] who stated they can take patient if she has DC Summary by 4:30 pm. Need rapid COVID Test. Notified patient, RN, and MD.  Expected Discharge Plan: Skilled Nursing Facility Barriers to Discharge: Continued Medical Work up  Expected Discharge Plan and Services Expected Discharge Plan: [place] arrangements for the past 2 months: Single Family Home                                       Social Determinants of Health (SDOH) Interventions    Readmission Risk Interventions No flowsheet data found.

## 2021-08-02 NOTE — Progress Notes (Signed)
PT Cancellation Note  Patient Details Name: Bryan Scott MRN: [id number] DOB: December 30, 1945   Cancelled Treatment:    Reason Eval/Treat Not Completed: Patient declined, no reason specified: Pt declined to participate with PT services this date in preparation for discharge to SNF.  Will attempt to see pt at a future date/time as medically appropriate if d/c is delayed.     Linus Salmons PT, DPT 08/02/21, 4:48 PM

## 2021-08-02 NOTE — Progress Notes (Signed)
Patient aware of discharge orders for SNF. Awaiting COVID swab results prior to transportation set up.

## 2021-08-02 NOTE — Progress Notes (Signed)
2nd attempt made at calling report to [facility], no answer.

## 2021-08-02 NOTE — Discharge Summary (Signed)
Physician Discharge Summary   Patient: Bryan Scott MRN: [id number] DOB: Oct 18, 1945  Admit date:     07/24/2021  Discharge date: 08/02/21  Discharge Physician: Loletha Grayer   [facility]  Recommendations at discharge:   Follow-up team at rehab 1 day Follow-up PCP upon discharge from rehab  Discharge Diagnoses: Principal Problem:   Generalized weakness Active Problems:   TIA (transient ischemic attack)   AKI (acute kidney injury) ([place])   Hyponatremia   PAF (paroxysmal atrial fibrillation) (H[facility])   Ascending aortic aneurysm   Lung nodule   Chronic diastolic CHF (congestive heart failure) (H[facility])   Hyperlipidemia   Essential hypertension   DM (diabetes mellitus), type 2 with peripheral vascular complications ([facility])   Carotid stenosis   CAD (coronary artery disease)   Chest pain  Resolved Problems:   Frequent falls   Hospital Course: Bryan Scott is a 76 y.o. male with medical history significant of hypertension, hyperlipidemia, diabetes mellitus, depression, PAF on Eliquis, former smoker, carotid artery stenosis, pancreatitis, dCHF, bladder cancer, who presents with chest pain, left-sided weakness.  Physical therapy recommending rehab.  Obtained insurance authorization patient will go out to rehab today.  The patient still has left-sided weakness.  Recommend checking a CBC and BMP in 1 week Recommend a PET CT scan as outpatient for lung nodule seen on CT scan of the neck     Assessment and Plan: * Generalized weakness Can go out to rehab when bed available.  Still awaiting insurance authorization  TIA (transient ischemic attack)- (present on admission) Patient still has left-sided weakness.  MRI of the brain did not show evidence of stroke.  Patient will be continued on Eliquis, Plavix.    AKI (acute kidney injury) (Bryan Scott)- (present on admission) Creatinine peaked at 1.26.  Last creatinine 1.0.  Hyponatremia- (present on admission) Last sodium 135  PAF (paroxysmal atrial  fibrillation) (Bryan Scott)- (present on admission) Continue Eliquis for anticoagulation to prevent stroke.  Ascending aortic aneurysm- (present on admission) 4 cm ascending aortic aneurysm.  Radiologist recommends annual imaging.  Lung nodule Incidental finding on CT angio of the neck.  2.5 cm pleural-based irregular soft tissue density along the medial aspect of the left upper lobe.  This was not commented on CT scan of the chest.  Follow-up with PCP as outpatient.  Can have a repeat CT scan of the chest in 3 months or PET CT scan.  Chronic diastolic CHF (congestive heart failure) (Bryan Scott)- (present on admission) No current signs of heart failure.  Last echocardiogram showed an EF of 70 to 75%  Hyperlipidemia- (present on admission) Continue high-dose Lipitor  Essential hypertension- (present on admission) Blood pressure stable  Chest pain- (present on admission) Patient evaluated by cardiology, troponin was normal. Patient has chest pain for the last 3 days, he also had multiple falls over the same period time.  Cardiology does not advise any additional work-up.  CAD (coronary artery disease)- (present on admission) Chest pain is not consistent with cardiac origin.  Continue home medicines.  DM (diabetes mellitus), type 2 with peripheral vascular complications (Bryan Scott)- (present on admission) Last hemoglobin A1c 7.0 we will restart Jardiance and Glucophage as outpatient.  During the hospital stay was on low-dose Semglee insulin 8 units and sliding scale but will discontinue that since going back on the oral medications   The patient did complain of some throat tightening up yesterday evening but received a dose of Benadryl and this has completely resolved.  I did order some as needed  Benadryl for him at the facility if needed.        Consultants: None Procedures performed: None Disposition: Rehabilitation facility Diet recommendation:  Cardiac and Carb modified diet  DISCHARGE  MEDICATION: Allergies as of 08/02/2021       Reactions   Aspergum [aspirin] Anaphylaxis   Aspirin Swelling   Lisinopril Anaphylaxis   Angiotensin Receptor Blockers    Never taken but PMH of angioedema with ACE-I ( Lisinopril)   Heparin Other (See Comments)   Reaction:  Bleeding         Medication List     STOP taking these medications    ibuprofen 200 MG tablet Commonly known as: ADVIL   neomycin-bacitracin-polymyxin ointment Commonly known as: NEOSPORIN   oxymetazoline 0.05 % nasal spray Commonly known as: AFRIN   rivaroxaban 20 MG Tabs tablet Commonly known as: XARELTO   spironolactone 25 MG tablet Commonly known as: ALDACTONE       TAKE these medications    acetaminophen 325 MG tablet Commonly known as: TYLENOL Take 2 tablets (650 mg total) by mouth every 6 (six) hours. What changed: how much to take   acetaminophen 325 MG tablet Commonly known as: TYLENOL Take 2 tablets (650 mg total) by mouth every 4 (four) hours as needed for mild pain (or temp > 37.5 C (99.5 F)). What changed: You were already taking a medication with the same name, and this prescription was added. Make sure you understand how and when to take each.   apixaban 5 MG Tabs tablet Commonly known as: ELIQUIS Take 5 mg by mouth 2 (two) times daily.   atorvastatin 80 MG tablet Commonly known as: LIPITOR Take 1 tablet (80 mg total) by mouth daily.   carvedilol 6.25 MG tablet Commonly known as: COREG Take 1 tablet (6.25 mg total) by mouth 2 (two) times daily with a meal.   clopidogrel 75 MG tablet Commonly known as: PLAVIX Take 1 tablet (75 mg total) by mouth every morning.   diphenhydrAMINE 25 mg capsule Commonly known as: BENADRYL Take 1 capsule (25 mg total) by mouth every 6 (six) hours as needed for itching or allergies (throat tightning feeling).   DULoxetine 30 MG capsule Commonly known as: CYMBALTA Take 1 capsule (30 mg total) by mouth daily.   empagliflozin 25 MG Tabs  tablet Commonly known as: Jardiance Take 1 tablet (25 mg total) by mouth daily.   ezetimibe 10 MG tablet Commonly known as: ZETIA Take 1 tablet (10 mg total) by mouth daily.   feeding supplement (GLUCERNA 1.2 CAL) Liqd Take 237 mLs by mouth daily in the afternoon. What changed: Another medication with the same name was added. Make sure you understand how and when to take each.   feeding supplement (GLUCERNA SHAKE) Liqd Take 237 mLs by mouth daily. Start taking on: August 03, 2021 What changed: You were already taking a medication with the same name, and this prescription was added. Make sure you understand how and when to take each.   gabapentin 100 MG capsule Commonly known as: NEURONTIN Take 1 capsule (100 mg total) by mouth 3 (three) times daily.   isosorbide mononitrate 60 MG 24 hr tablet Commonly known as: IMDUR Take 1 tablet (60 mg total) by mouth daily. What changed: how much to take   melatonin 5 MG Tabs Take 1 tablet (5 mg total) by mouth at bedtime.   metFORMIN 500 MG tablet Commonly known as: GLUCOPHAGE Take 1 tablet (500 mg total) by mouth daily with breakfast.  multivitamin with minerals Tabs tablet Take 1 tablet by mouth daily.   nitroGLYCERIN 0.4 MG SL tablet Commonly known as: NITROSTAT Place 1 tablet (0.4 mg total) under the tongue every 5 (five) minutes as needed for chest pain.   ondansetron 4 MG tablet Commonly known as: Zofran Take 1 tablet (4 mg total) by mouth daily as needed for nausea or vomiting.   pantoprazole 40 MG tablet Commonly known as: PROTONIX Take 1 tablet (40 mg total) by mouth daily.   polyethylene glycol 17 g packet Commonly known as: MIRALAX / GLYCOLAX Take 17 g by mouth daily.   ranolazine 500 MG 12 hr tablet Commonly known as: RANEXA Take 1 tablet (500 mg total) by mouth 2 (two) times daily.   senna-docusate 8.6-50 MG tablet Commonly known as: Senokot-S Take 2 tablets by mouth 2 (two) times daily.   sucralfate 1 g  tablet Commonly known as: CARAFATE Take 1 tablet (1 g total) by mouth 4 (four) times daily -  with meals and at bedtime.   VISINE OP Place 1 drop into both eyes daily as needed (dry red eyes).         Discharge Exam: Filed Weights   07/24/21 2047  Weight: 84.7 kg   Physical Exam HENT:     Head: Normocephalic.     Mouth/Throat:     Pharynx: No oropharyngeal exudate.  Eyes:     General: Lids are normal.     Conjunctiva/sclera: Conjunctivae normal.  Neck:     Comments: No swelling of the neck Cardiovascular:     Rate and Rhythm: Normal rate and regular rhythm.     Heart sounds: Normal heart sounds, S1 normal and S2 normal.  Pulmonary:     Breath sounds: Normal breath sounds. No decreased breath sounds, wheezing, rhonchi or rales.  Abdominal:     Palpations: Abdomen is soft.     Tenderness: There is no abdominal tenderness.  Musculoskeletal:     Right lower leg: No swelling.     Left lower leg: No swelling.  Lymphadenopathy:     Cervical:     Right cervical: No superficial cervical adenopathy.    Left cervical: No superficial cervical adenopathy.  Skin:    General: Skin is warm.     Findings: No rash.  Neurological:     Mental Status: He is alert and oriented to person, place, and time.     Comments: Able to straight leg raise.  Power 4+ out of 5 left side, 5 out of 5 right side.     Condition at discharge: stable  The results of significant diagnostics from this hospitalization (including imaging, microbiology, ancillary and laboratory) are listed below for reference.   Imaging Studies: MR Brain Wo Contrast (neuro protocol)  Result Date: 07/25/2021 CLINICAL DATA:  Initial evaluation for neuro deficit, stroke suspected. EXAM: MRI HEAD WITHOUT CONTRAST TECHNIQUE: Multiplanar, multiecho pulse sequences of the brain and surrounding structures were obtained without intravenous contrast. COMPARISON:  Prior CT from earlier the same day. FINDINGS: Brain: Cerebral volume  within normal limits. No significant cerebral white matter disease for age. No evidence for acute or subacute infarct. Gray-white matter differentiation maintained. No areas of chronic cortical infarction. No acute intracranial hemorrhage. Single punctate chronic microhemorrhage noted at the medulla, of doubtful significance in isolation. No mass lesion, midline shift or mass effect. No hydrocephalus or extra-axial fluid collection. Pituitary gland suprasellar region normal. Midline structures intact. Vascular: Major intracranial vascular flow voids are maintained. Skull and upper  cervical spine: Craniocervical junction within normal limits. Bone marrow signal intensity normal. No scalp soft tissue abnormality. Sinuses/Orbits: Globes orbital soft tissues within normal limits. Left maxillary sinusitis noted. Mild mucosal thickening noted within the ethmoidal air cells and right maxillary sinus. Small left mastoid effusion noted. Other: None. IMPRESSION: 1. Normal brain MRI for age. No acute intracranial abnormality identified. 2. Left maxillary sinusitis. Electronically Signed   By: Jeannine Boga M.D.   On: 07/25/2021 00:57   CT Angio Chest/Abd/Pel for Dissection W and/or W/WO  Result Date: 07/24/2021 CLINICAL DATA:  Mid chest pain radiating down both legs EXAM: CT ANGIOGRAPHY CHEST, ABDOMEN AND PELVIS TECHNIQUE: Non-contrast CT of the chest was initially obtained. Multidetector CT imaging through the chest, abdomen and pelvis was performed using the standard protocol during bolus administration of intravenous contrast. Multiplanar reconstructed images and MIPs were obtained and reviewed to evaluate the vascular anatomy. RADIATION DOSE REDUCTION: This exam was performed according to the departmental dose-optimization program which includes automated exposure control, adjustment of the mA and/or kV according to patient size and/or use of iterative reconstruction technique. CONTRAST:  61mL OMNIPAQUE IOHEXOL  350 MG/ML SOLN COMPARISON:  02/17/2021 FINDINGS: CTA CHEST FINDINGS Cardiovascular: 4 cm ascending thoracic aortic aneurysm. No evidence of dissection. Mild atherosclerosis of the aortic arch. The heart is unremarkable without pericardial effusion. 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 While not optimized for opacification of the pulmonary vasculature, there is sufficient contrast enhancement to exclude central pulmonary emboli. Mediastinum/Nodes: No enlarged mediastinal, hilar, or axillary lymph nodes. Thyroid gland, trachea, and esophagus demonstrate no significant findings. Lungs/Pleura: No acute airspace disease, effusion, or pneumothorax. Central airways are patent. Musculoskeletal: No acute or destructive bony lesions. Reconstructed images demonstrate no additional findings. Review of the MIP images confirms the above findings. CTA ABDOMEN AND PELVIS FINDINGS VASCULAR Aorta: Normal caliber aorta without aneurysm, dissection, vasculitis or significant stenosis. Scattered atherosclerosis. Celiac: Patent without evidence of aneurysm, dissection, vasculitis or significant stenosis. 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 Atherosclerosis at the origin of the common hepatic and celiac arteries without flow limiting stenosis. SMA: Patent without evidence of aneurysm, dissection, vasculitis or significant stenosis. Atherosclerosis at the origin of the SMA without flow limiting stenosis. Renals: Both renal arteries are patent without evidence of aneurysm, dissection, vasculitis, fibromuscular dysplasia or significant stenosis. Stable mild atherosclerosis. IMA: Patent without evidence of aneurysm, dissection, vasculitis or significant stenosis. Inflow: Patent right common iliac artery stent unchanged. Stable atherosclerosis without flow limiting stenosis, aneurysm, or dissection. Veins: No obvious venous abnormality within the limitations of this arterial phase study.  Review of the MIP images confirms the above findings. NON-VASCULAR Hepatobiliary: No focal liver abnormality is seen. No gallstones, gallbladder wall thickening, or biliary dilatation. Pancreas: Incidental pancreatic divisum. Normal enhancement of the pancreatic parenchyma. No inflammatory changes or pancreatic duct dilation. Spleen: Normal in size without focal abnormality. Adrenals/Urinary Tract: Adrenal glands are unremarkable. Kidneys are normal, without renal calculi, focal lesion, or hydronephrosis. Bladder is unremarkable. Stomach/Bowel: No bowel obstruction or ileus. Normal appendix right lower quadrant. No bowel wall thickening or inflammatory change. Lymphatic: No pathologic adenopathy within the abdomen or pelvis. Reproductive: Prostate is unremarkable. Other: No free fluid or free intraperitoneal gas. No abdominal wall hernia. Musculoskeletal: No acute or destructive bony lesions. Reconstructed images demonstrate no additional findings. Review of the MIP images confirms the above findings. IMPRESSION: 1. 4 cm ascending thoracic aortic aneurysm. Recommend annual imaging followup by CTA or MRA. This recommendation follows  2010 ACCF/AHA/AATS/ACR/ASA/SCA/SCAI/SIR/STS/SVM Guidelines for the Diagnosis and Management of Patients with Thoracic Aortic Disease. Circulation. 2010; 121: P102-H852. Aortic aneurysm NOS (ICD10-I71.9) 2. No evidence of aortic dissection. 3. No acute intrathoracic, intra-abdominal, or intrapelvic process. 4.  Aortic Atherosclerosis (ICD10-I70.0). Electronically Signed   By: Randa Ngo M.D.   On: 07/24/2021 21:04   CT HEAD CODE STROKE WO CONTRAST  Result Date: 07/24/2021 CLINICAL DATA:  Code stroke.  Acute neuro deficit. EXAM: CT HEAD WITHOUT CONTRAST TECHNIQUE: Contiguous axial images were obtained from the base of the skull through the vertex without intravenous contrast. RADIATION DOSE REDUCTION: This exam was performed according to the departmental dose-optimization program  which includes automated exposure control, adjustment of the mA and/or kV according to patient size and/or use of iterative reconstruction technique. COMPARISON:  CT head 09/17/2020 FINDINGS: Brain: No evidence of acute infarction, hemorrhage, hydrocephalus, extra-axial collection or mass lesion/mass effect. Vascular: Symmetric hyperintensity in the middle cerebral arteries bilaterally and in the basilar. No focal vascular hyperdensity. Skull: Negative Sinuses/Orbits: Mild mucosal edema paranasal sinuses. Negative orbit Other: None ASPECTS ([place] Stroke Program Early CT Score) - Ganglionic level infarction (caudate, lentiform nuclei, internal capsule, insula, M1-M3 cortex): 7 - Supraganglionic infarction (M4-M6 cortex): 3 Total score (0-10 with 10 being normal): 10 IMPRESSION: 1. No acute intracranial abnormality. 2. ASPECTS is 10 3. Code stroke imaging results were communicated on 07/24/2021 at 7:52 pm to provider Rory Percy via text page Electronically Signed   By: Franchot Gallo M.D.   On: 07/24/2021 19:53   CT ANGIO HEAD NECK W WO CM W PERF (CODE STROKE)  Result Date: 07/24/2021 CLINICAL DATA:  Initial evaluation for neuro deficit, stroke suspected. EXAM: CT ANGIOGRAPHY HEAD AND NECK CT PERFUSION BRAIN TECHNIQUE: Multidetector CT imaging of the head and neck was performed using the standard protocol during bolus administration of intravenous contrast. Multiplanar CT image reconstructions and MIPs were obtained to evaluate the vascular anatomy. Carotid stenosis measurements (when applicable) are obtained utilizing NASCET criteria, using the distal internal carotid diameter as the denominator. Multiphase CT imaging of the brain was performed following IV bolus contrast injection. Subsequent parametric perfusion maps were calculated using RAPID software. RADIATION DOSE REDUCTION: This exam was performed according to the departmental dose-optimization program which includes automated exposure control, adjustment of  the mA and/or kV according to patient size and/or use of iterative reconstruction technique. CONTRAST:  37mL OMNIPAQUE IOHEXOL 350 MG/ML SOLN COMPARISON:  Prior head CT from earlier the same day. FINDINGS: CTA NECK FINDINGS Aortic arch: Visualized aortic arch normal in caliber with normal 3 vessel morphology. Mild-to-moderate atheromatous change about the arch and origin of the great vessels without significant stenosis. Right carotid system: Right CCA widely patent proximally. Vascular stent spans the distal right CCA/right bifurcation. Patent flow through the stent. Right ICA patent distally without stenosis or dissection. Left carotid system: Left CCA patent proximally. Vascular stent spans the distal left CCA/left bifurcation. Patent flow through the stent. Left ICA patent distally without stenosis or dissection. Vertebral arteries: Both vertebral arteries arise from the subclavian arteries. Vertebral arteries patent without stenosis or dissection. Skeleton: No discrete or worrisome osseous lesions. Patient is edentulous. Prior fusion at C5-C7, with posterior decompression at C3 through C5. Other neck: No other acute soft tissue abnormality within the neck. Upper chest: There is a 2.5 cm pleural based irregular soft tissue density along the medial aspect of the left upper lobe, immediately adjacent to the aortic arch (series 9, image 7), indeterminate. Visualized upper chest demonstrates no other  acute finding. Review of the MIP images confirms the above findings CTA HEAD FINDINGS Anterior circulation: Petrous segments patent bilaterally. Mild atheromatous change within the carotid siphons without significant stenosis. A1 segments patent bilaterally. Right A1 hypoplastic. Normal anterior communicating artery complex. Anterior cerebral arteries patent without stenosis. No M1 stenosis or occlusion. Normal MCA bifurcations. Distal MCA branches perfused and symmetric. Posterior circulation: Both V4 segments patent  without stenosis. Left PICA patent. Right PICA not seen. Basilar patent to its distal aspect without stenosis. Superior cerebral arteries patent bilaterally. Left PCA supplied via the basilar. Fetal type origin right PCA. Both PCAs patent to their distal aspects. Venous sinuses: Patent allowing for timing the contrast bolus. Anatomic variants: Fetal type origin of the right PCA.  No aneurysm. Review of the MIP images confirms the above findings CT Brain Perfusion Findings: ASPECTS: 10. CBF (<30%) Volume: 28mL Perfusion (Tmax>6.0s) volume: 33mL Mismatch Volume: 34mL Infarction Location:Negative CT perfusion with no evidence for acute ischemia or other perfusion deficit. IMPRESSION: 1. Negative CTA for emergent large vessel occlusion. 2. Negative CT perfusion with no evidence for acute ischemia or other perfusion deficit. 3. Bilateral carotid artery stents in place with patent flow. 4. Mild for age atheromatous change without hemodynamically significant or correctable stenosis. 5. 2.5 cm pleural based irregular soft tissue density along the medial aspect of the left upper lobe, indeterminate. While this finding could reflect an area of focal scarring and/or atelectasis, a possible pleural base nodule is not excluded. Consider one of the following in 3 months for both low-risk and high-risk individuals: (a) repeat chest CT, (b) follow-up PET-CT, or (c) tissue sampling. This recommendation follows the consensus statement: Guidelines for Management of Incidental Pulmonary Nodules Detected on CT Images: From the Fleischner Society 2017; Radiology 2017; 284:228-243. Electronically Signed   By: Jeannine Boga M.D.   On: 07/24/2021 22:21    Microbiology: Results for orders placed or performed during the hospital encounter of 07/24/21  Resp Panel by RT-PCR (Flu A&B, Covid) Nasopharyngeal Swab     Status: None   Collection Time: 07/24/21  8:48 PM   Specimen: Nasopharyngeal Swab; Nasopharyngeal(NP) swabs in vial  transport medium  Result Value Ref Range Status   SARS Coronavirus 2 by RT PCR NEGATIVE NEGATIVE Final    Comment: (NOTE) SARS-CoV-2 target nucleic acids are NOT DETECTED.  The SARS-CoV-2 RNA is generally detectable in upper respiratory specimens during the acute phase of infection. The lowest concentration of SARS-CoV-2 viral copies this assay can detect is 138 copies/mL. A negative result does not preclude SARS-Cov-2 infection and should not be used as the sole basis for treatment or other patient management decisions. A negative result may occur with  improper specimen collection/handling, submission of specimen other than nasopharyngeal swab, presence of viral mutation(s) within the areas targeted by this assay, and inadequate number of viral copies(<138 copies/mL). A negative result must be combined with clinical observations, patient history, and epidemiological information. The expected result is Negative.  Fact Sheet for Patients:  EntrepreneurPulse.com.au  Fact Sheet for Healthcare Providers:  IncredibleEmployment.be  This test is no t yet approved or cleared by the [place] FDA and  has been authorized for detection and/or diagnosis of SARS-CoV-2 by FDA under an Emergency Use Authorization (EUA). This EUA will remain  in effect (meaning this test can be used) for the duration of the COVID-19 declaration under Section 564(b)(1) of the Act, 21 U.S.C.section 360bbb-3(b)(1), unless the authorization is terminated  or revoked sooner.  Influenza A by PCR NEGATIVE NEGATIVE Final   Influenza B by PCR NEGATIVE NEGATIVE Final    Comment: (NOTE) The Xpert Xpress SARS-CoV-2/FLU/RSV plus assay is intended as an aid in the diagnosis of influenza from Nasopharyngeal swab specimens and should not be used as a sole basis for treatment. Nasal washings and aspirates are unacceptable for Xpert Xpress SARS-CoV-2/FLU/RSV testing.  Fact  Sheet for Patients: EntrepreneurPulse.com.au  Fact Sheet for Healthcare Providers: IncredibleEmployment.be  This test is not yet approved or cleared by the [place] FDA and has been authorized for detection and/or diagnosis of SARS-CoV-2 by FDA under an Emergency Use Authorization (EUA). This EUA will remain in effect (meaning this test can be used) for the duration of the COVID-19 declaration under Section 564(b)(1) of the Act, 21 U.S.C. section 360bbb-3(b)(1), unless the authorization is terminated or revoked.  Performed at [facility], [address] [place], [place] [address]     Labs: CBC: Recent Labs  Lab 07/28/21 1030  WBC 9.5  HGB 16.3  HCT 47.4  MCV 94.2  PLT 166    CBG: Recent Labs  Lab 08/01/21 1757 08/01/21 2022 08/02/21 0818 08/02/21 1144 08/02/21 1527  GLUCAP 361* 173* 239* 203* 233*    Discharge time spent: greater than 30 minutes.  Signed: Loletha Grayer, MD [facility] 08/02/2021

## 2021-08-02 NOTE — TOC Transition Note (Signed)
Transition of Care [facility]) - CM/SW Discharge Note   Patient Details  Name: SARVESH MEDDAUGH MRN: [id number] Date of Birth: December 26, 1945  Transition of Care [facility]) CM/SW Contact:  Magnus Ivan, LCSW Phone Number: 08/02/2021, 4:49 PM   Clinical Narrative:   Discharge to [facility] today.  Room 320. Confirmed with Admissions Worker [name]. Updated MD, RN, and patient. Asked RN to call report. EMS paperwork completed. EMS arranged through [facility]. Patient is 7th on the list. Bedside RN Tiffanie to call and cancel [facility] if COVID test comes back positive.     Final next level of care: Skilled Nursing Facility Barriers to Discharge: Barriers Resolved   Patient Goals and CMS Choice Patient states their goals for this hospitalization and ongoing recovery are:: SNF CMS Medicare.gov Compare Post Acute Care list provided to:: Patient Choice offered to / list presented to : Patient  Discharge Placement              Patient chooses bed at: [facility] Patient to be transferred to facility by: [facility] Name of family member notified: patient Patient and family notified of of transfer: 08/02/21  Discharge Plan and Services                                     Social Determinants of Health (SDOH) Interventions     Readmission Risk Interventions No flowsheet data found.

## 2021-08-06 DIAGNOSIS — E871 Hypo-osmolality and hyponatremia: Secondary | ICD-10-CM | POA: Diagnosis not present

## 2021-08-06 DIAGNOSIS — I480 Paroxysmal atrial fibrillation: Secondary | ICD-10-CM | POA: Diagnosis not present

## 2021-08-06 DIAGNOSIS — R531 Weakness: Secondary | ICD-10-CM | POA: Diagnosis not present

## 2021-08-06 DIAGNOSIS — G459 Transient cerebral ischemic attack, unspecified: Secondary | ICD-10-CM | POA: Diagnosis not present

## 2021-08-12 DIAGNOSIS — I480 Paroxysmal atrial fibrillation: Secondary | ICD-10-CM | POA: Diagnosis not present

## 2021-08-12 DIAGNOSIS — E785 Hyperlipidemia, unspecified: Secondary | ICD-10-CM | POA: Diagnosis not present

## 2021-08-12 DIAGNOSIS — Z8673 Personal history of transient ischemic attack (TIA), and cerebral infarction without residual deficits: Secondary | ICD-10-CM | POA: Diagnosis not present

## 2021-08-12 DIAGNOSIS — Z794 Long term (current) use of insulin: Secondary | ICD-10-CM | POA: Diagnosis not present

## 2021-08-14 DIAGNOSIS — I5032 Chronic diastolic (congestive) heart failure: Secondary | ICD-10-CM | POA: Diagnosis not present

## 2021-08-14 DIAGNOSIS — E1151 Type 2 diabetes mellitus with diabetic peripheral angiopathy without gangrene: Secondary | ICD-10-CM | POA: Diagnosis not present

## 2021-08-14 DIAGNOSIS — I214 Non-ST elevation (NSTEMI) myocardial infarction: Secondary | ICD-10-CM | POA: Diagnosis not present

## 2021-08-14 DIAGNOSIS — G8194 Hemiplegia, unspecified affecting left nondominant side: Secondary | ICD-10-CM | POA: Diagnosis not present

## 2021-08-14 DIAGNOSIS — I480 Paroxysmal atrial fibrillation: Secondary | ICD-10-CM | POA: Diagnosis not present

## 2021-08-14 DIAGNOSIS — M6281 Muscle weakness (generalized): Secondary | ICD-10-CM | POA: Diagnosis not present

## 2021-09-10 DIAGNOSIS — R69 Illness, unspecified: Secondary | ICD-10-CM | POA: Diagnosis not present

## 2021-09-17 DIAGNOSIS — R69 Illness, unspecified: Secondary | ICD-10-CM | POA: Diagnosis not present

## 2021-09-25 DIAGNOSIS — R531 Weakness: Secondary | ICD-10-CM | POA: Diagnosis not present

## 2021-09-25 DIAGNOSIS — I4891 Unspecified atrial fibrillation: Secondary | ICD-10-CM | POA: Diagnosis not present

## 2021-09-25 DIAGNOSIS — I639 Cerebral infarction, unspecified: Secondary | ICD-10-CM | POA: Diagnosis not present

## 2021-09-25 DIAGNOSIS — K219 Gastro-esophageal reflux disease without esophagitis: Secondary | ICD-10-CM | POA: Diagnosis not present

## 2021-09-25 DIAGNOSIS — M4802 Spinal stenosis, cervical region: Secondary | ICD-10-CM | POA: Diagnosis not present

## 2021-09-25 DIAGNOSIS — G629 Polyneuropathy, unspecified: Secondary | ICD-10-CM | POA: Diagnosis not present

## 2021-09-25 DIAGNOSIS — I2510 Atherosclerotic heart disease of native coronary artery without angina pectoris: Secondary | ICD-10-CM | POA: Diagnosis not present

## 2021-09-25 DIAGNOSIS — E119 Type 2 diabetes mellitus without complications: Secondary | ICD-10-CM | POA: Diagnosis not present

## 2021-10-02 DIAGNOSIS — M5442 Lumbago with sciatica, left side: Secondary | ICD-10-CM | POA: Diagnosis not present

## 2021-10-02 DIAGNOSIS — G629 Polyneuropathy, unspecified: Secondary | ICD-10-CM | POA: Diagnosis not present

## 2021-11-13 DIAGNOSIS — I4891 Unspecified atrial fibrillation: Secondary | ICD-10-CM | POA: Diagnosis not present

## 2021-11-13 DIAGNOSIS — I10 Essential (primary) hypertension: Secondary | ICD-10-CM | POA: Diagnosis not present

## 2021-11-13 DIAGNOSIS — M5442 Lumbago with sciatica, left side: Secondary | ICD-10-CM | POA: Diagnosis not present

## 2021-11-13 DIAGNOSIS — I2510 Atherosclerotic heart disease of native coronary artery without angina pectoris: Secondary | ICD-10-CM | POA: Diagnosis not present

## 2021-11-13 DIAGNOSIS — K219 Gastro-esophageal reflux disease without esophagitis: Secondary | ICD-10-CM | POA: Diagnosis not present

## 2021-11-13 DIAGNOSIS — E119 Type 2 diabetes mellitus without complications: Secondary | ICD-10-CM | POA: Diagnosis not present

## 2021-12-09 DIAGNOSIS — I639 Cerebral infarction, unspecified: Secondary | ICD-10-CM | POA: Diagnosis not present

## 2021-12-09 DIAGNOSIS — M5442 Lumbago with sciatica, left side: Secondary | ICD-10-CM | POA: Diagnosis not present

## 2021-12-09 DIAGNOSIS — I2510 Atherosclerotic heart disease of native coronary artery without angina pectoris: Secondary | ICD-10-CM | POA: Diagnosis not present

## 2021-12-09 DIAGNOSIS — E119 Type 2 diabetes mellitus without complications: Secondary | ICD-10-CM | POA: Diagnosis not present

## 2021-12-09 DIAGNOSIS — I10 Essential (primary) hypertension: Secondary | ICD-10-CM | POA: Diagnosis not present

## 2021-12-09 DIAGNOSIS — K219 Gastro-esophageal reflux disease without esophagitis: Secondary | ICD-10-CM | POA: Diagnosis not present

## 2021-12-10 DIAGNOSIS — E119 Type 2 diabetes mellitus without complications: Secondary | ICD-10-CM | POA: Diagnosis not present

## 2021-12-10 DIAGNOSIS — I639 Cerebral infarction, unspecified: Secondary | ICD-10-CM | POA: Diagnosis not present

## 2021-12-10 DIAGNOSIS — I4891 Unspecified atrial fibrillation: Secondary | ICD-10-CM | POA: Diagnosis not present

## 2021-12-10 DIAGNOSIS — R81 Glycosuria: Secondary | ICD-10-CM | POA: Diagnosis not present

## 2021-12-10 DIAGNOSIS — K219 Gastro-esophageal reflux disease without esophagitis: Secondary | ICD-10-CM | POA: Diagnosis not present

## 2021-12-10 DIAGNOSIS — R531 Weakness: Secondary | ICD-10-CM | POA: Diagnosis not present

## 2021-12-10 DIAGNOSIS — Z79899 Other long term (current) drug therapy: Secondary | ICD-10-CM | POA: Diagnosis not present

## 2021-12-10 DIAGNOSIS — I2510 Atherosclerotic heart disease of native coronary artery without angina pectoris: Secondary | ICD-10-CM | POA: Diagnosis not present

## 2021-12-12 DIAGNOSIS — I4891 Unspecified atrial fibrillation: Secondary | ICD-10-CM | POA: Diagnosis not present

## 2021-12-12 DIAGNOSIS — M5442 Lumbago with sciatica, left side: Secondary | ICD-10-CM | POA: Diagnosis not present

## 2021-12-12 DIAGNOSIS — I10 Essential (primary) hypertension: Secondary | ICD-10-CM | POA: Diagnosis not present

## 2021-12-12 DIAGNOSIS — E119 Type 2 diabetes mellitus without complications: Secondary | ICD-10-CM | POA: Diagnosis not present

## 2021-12-12 DIAGNOSIS — K219 Gastro-esophageal reflux disease without esophagitis: Secondary | ICD-10-CM | POA: Diagnosis not present

## 2021-12-12 DIAGNOSIS — I2510 Atherosclerotic heart disease of native coronary artery without angina pectoris: Secondary | ICD-10-CM | POA: Diagnosis not present

## 2021-12-12 DIAGNOSIS — Z79899 Other long term (current) drug therapy: Secondary | ICD-10-CM | POA: Diagnosis not present

## 2021-12-12 DIAGNOSIS — I639 Cerebral infarction, unspecified: Secondary | ICD-10-CM | POA: Diagnosis not present

## 2021-12-18 ENCOUNTER — Other Ambulatory Visit: Payer: Self-pay

## 2021-12-18 ENCOUNTER — Observation Stay
Admission: EM | Admit: 2021-12-18 | Discharge: 2021-12-20 | Disposition: A | Discharge status: Other Acute Inpatient Hospital | Payer: No Typology Code available for payment source | Attending: Internal Medicine | Admitting: Internal Medicine

## 2021-12-18 ENCOUNTER — Emergency Department: Payer: No Typology Code available for payment source

## 2021-12-18 DIAGNOSIS — Z951 Presence of aortocoronary bypass graft: Secondary | ICD-10-CM | POA: Insufficient documentation

## 2021-12-18 DIAGNOSIS — E785 Hyperlipidemia, unspecified: Secondary | ICD-10-CM | POA: Diagnosis not present

## 2021-12-18 DIAGNOSIS — Z8551 Personal history of malignant neoplasm of bladder: Secondary | ICD-10-CM | POA: Diagnosis not present

## 2021-12-18 DIAGNOSIS — Z7901 Long term (current) use of anticoagulants: Secondary | ICD-10-CM | POA: Insufficient documentation

## 2021-12-18 DIAGNOSIS — Z7984 Long term (current) use of oral hypoglycemic drugs: Secondary | ICD-10-CM | POA: Diagnosis not present

## 2021-12-18 DIAGNOSIS — E1151 Type 2 diabetes mellitus with diabetic peripheral angiopathy without gangrene: Secondary | ICD-10-CM | POA: Diagnosis not present

## 2021-12-18 DIAGNOSIS — R42 Dizziness and giddiness: Secondary | ICD-10-CM | POA: Diagnosis not present

## 2021-12-18 DIAGNOSIS — R0789 Other chest pain: Secondary | ICD-10-CM

## 2021-12-18 DIAGNOSIS — R55 Syncope and collapse: Principal | ICD-10-CM | POA: Insufficient documentation

## 2021-12-18 DIAGNOSIS — Z955 Presence of coronary angioplasty implant and graft: Secondary | ICD-10-CM | POA: Insufficient documentation

## 2021-12-18 DIAGNOSIS — E1159 Type 2 diabetes mellitus with other circulatory complications: Secondary | ICD-10-CM | POA: Diagnosis not present

## 2021-12-18 DIAGNOSIS — Z87891 Personal history of nicotine dependence: Secondary | ICD-10-CM | POA: Insufficient documentation

## 2021-12-18 DIAGNOSIS — G894 Chronic pain syndrome: Secondary | ICD-10-CM | POA: Diagnosis not present

## 2021-12-18 DIAGNOSIS — R9431 Abnormal electrocardiogram [ECG] [EKG]: Secondary | ICD-10-CM | POA: Insufficient documentation

## 2021-12-18 DIAGNOSIS — I10 Essential (primary) hypertension: Secondary | ICD-10-CM | POA: Diagnosis not present

## 2021-12-18 DIAGNOSIS — R079 Chest pain, unspecified: Secondary | ICD-10-CM | POA: Diagnosis not present

## 2021-12-18 DIAGNOSIS — Z7902 Long term (current) use of antithrombotics/antiplatelets: Secondary | ICD-10-CM | POA: Diagnosis not present

## 2021-12-18 DIAGNOSIS — N200 Calculus of kidney: Secondary | ICD-10-CM | POA: Diagnosis not present

## 2021-12-18 DIAGNOSIS — Z79899 Other long term (current) drug therapy: Secondary | ICD-10-CM | POA: Diagnosis not present

## 2021-12-18 DIAGNOSIS — I480 Paroxysmal atrial fibrillation: Secondary | ICD-10-CM | POA: Diagnosis not present

## 2021-12-18 DIAGNOSIS — I2510 Atherosclerotic heart disease of native coronary artery without angina pectoris: Secondary | ICD-10-CM | POA: Diagnosis present

## 2021-12-18 DIAGNOSIS — E1169 Type 2 diabetes mellitus with other specified complication: Secondary | ICD-10-CM | POA: Insufficient documentation

## 2021-12-18 DIAGNOSIS — I5032 Chronic diastolic (congestive) heart failure: Secondary | ICD-10-CM

## 2021-12-18 DIAGNOSIS — J984 Other disorders of lung: Secondary | ICD-10-CM | POA: Diagnosis not present

## 2021-12-18 DIAGNOSIS — R262 Difficulty in walking, not elsewhere classified: Secondary | ICD-10-CM | POA: Insufficient documentation

## 2021-12-18 DIAGNOSIS — I25118 Atherosclerotic heart disease of native coronary artery with other forms of angina pectoris: Secondary | ICD-10-CM | POA: Insufficient documentation

## 2021-12-18 DIAGNOSIS — I739 Peripheral vascular disease, unspecified: Secondary | ICD-10-CM | POA: Diagnosis not present

## 2021-12-18 DIAGNOSIS — I700 Atherosclerosis of aorta: Secondary | ICD-10-CM | POA: Diagnosis not present

## 2021-12-18 DIAGNOSIS — K551 Chronic vascular disorders of intestine: Secondary | ICD-10-CM | POA: Diagnosis not present

## 2021-12-18 DIAGNOSIS — R110 Nausea: Secondary | ICD-10-CM | POA: Diagnosis not present

## 2021-12-18 DIAGNOSIS — M6281 Muscle weakness (generalized): Secondary | ICD-10-CM | POA: Insufficient documentation

## 2021-12-18 DIAGNOSIS — N179 Acute kidney failure, unspecified: Secondary | ICD-10-CM | POA: Insufficient documentation

## 2021-12-18 LAB — TROPONIN I (HIGH SENSITIVITY)
Troponin I (High Sensitivity): 5 ng/L (ref <18)
Troponin I (High Sensitivity): 4 ng/L (ref <18)
Troponin I (High Sensitivity): 5 ng/L (ref <18)

## 2021-12-18 LAB — COMPREHENSIVE METABOLIC PANEL
ALT: 24 U/L (ref 0–44)
AST: 30 U/L (ref 15–41)
Albumin: 4.2 g/dL (ref 3.5–5.0)
Alkaline Phosphatase: 75 U/L (ref 38–126)
Anion gap: 10 (ref 5–15)
BUN: 18 mg/dL (ref 8–23)
CO2: 24 mmol/L (ref 22–32)
Calcium: 9.6 mg/dL (ref 8.9–10.3)
Chloride: 100 mmol/L (ref 98–111)
Creatinine, Ser: 0.92 mg/dL (ref 0.61–1.24)
GFR, Estimated: >60 mL/min (ref >60)
Glucose, Bld: 132 mg/dL — ABNORMAL HIGH (ref 70–99)
Potassium: 4.6 mmol/L (ref 3.5–5.1)
Sodium: 134 mmol/L — ABNORMAL LOW (ref 135–145)
Total Bilirubin: 3.1 mg/dL — ABNORMAL HIGH (ref 0.3–1.2)
Total Protein: 7.7 g/dL (ref 6.5–8.1)

## 2021-12-18 LAB — CBC WITH DIFFERENTIAL/PLATELET
Abs Immature Granulocytes: 0.03 10*3/uL (ref 0.00–0.07)
Basophils Absolute: 0.1 10*3/uL (ref 0.0–0.1)
Basophils Relative: 1 %
Eosinophils Absolute: 0.2 10*3/uL (ref 0.0–0.5)
Eosinophils Relative: 3 %
HCT: 51.3 % (ref 39.0–52.0)
Hemoglobin: 17.3 g/dL — ABNORMAL HIGH (ref 13.0–17.0)
Immature Granulocytes: 0 %
Lymphocytes Relative: 20 %
Lymphs Abs: 1.9 10*3/uL (ref 0.7–4.0)
MCH: 32.5 pg (ref 26.0–34.0)
MCHC: 33.7 g/dL (ref 30.0–36.0)
MCV: 96.4 fL (ref 80.0–100.0)
Monocytes Absolute: 0.5 10*3/uL (ref 0.1–1.0)
Monocytes Relative: 6 %
Neutro Abs: 6.5 10*3/uL (ref 1.7–7.7)
Neutrophils Relative %: 70 %
Platelets: 174 10*3/uL (ref 150–400)
RBC: 5.32 MIL/uL (ref 4.22–5.81)
RDW: 12.2 % (ref 11.5–15.5)
WBC: 9.3 10*3/uL (ref 4.0–10.5)
nRBC: 0 % (ref 0.0–0.2)

## 2021-12-18 LAB — PROTIME-INR
INR: 1.3 — ABNORMAL HIGH (ref 0.8–1.2)
Prothrombin Time: 15.6 seconds — ABNORMAL HIGH (ref 11.4–15.2)

## 2021-12-18 LAB — CBG MONITORING, ED: Glucose-Capillary: 128 mg/dL — ABNORMAL HIGH (ref 70–99)

## 2021-12-18 LAB — APTT: aPTT: 35 seconds (ref 24–36)

## 2021-12-18 LAB — HEPARIN LEVEL (UNFRACTIONATED): Heparin Unfractionated: >1.1 IU/mL — ABNORMAL HIGH (ref 0.30–0.70)

## 2021-12-18 MED ORDER — ONDANSETRON HCL 4 MG PO TABS
4.0000 mg | ORAL_TABLET | Freq: Every day | ORAL | Status: DC | PRN
  Administered 2021-12-18 – 2021-12-19 (×3): 4 mg via ORAL
  Filled 2021-12-18 (×3): qty 1

## 2021-12-18 MED ORDER — NITROGLYCERIN 0.4 MG SL SUBL: 0.4000 mg | SUBLINGUAL_TABLET | SUBLINGUAL | Status: DC | PRN

## 2021-12-18 MED ORDER — MELATONIN 5 MG PO TABS
5.0000 mg | ORAL_TABLET | Freq: Every day | ORAL | Status: DC
  Administered 2021-12-18 – 2021-12-19 (×2): 5 mg via ORAL
  Filled 2021-12-18 (×2): qty 1

## 2021-12-18 MED ORDER — ISOSORBIDE MONONITRATE ER 60 MG PO TB24
60.0000 mg | ORAL_TABLET | Freq: Every day | ORAL | Status: DC
  Administered 2021-12-18 – 2021-12-20 (×3): 60 mg via ORAL
  Filled 2021-12-18 (×3): qty 1

## 2021-12-18 MED ORDER — DULOXETINE HCL 30 MG PO CPEP: 30.0000 mg | ORAL_CAPSULE | Freq: Every day | ORAL | Status: DC

## 2021-12-18 MED ORDER — HEPARIN (PORCINE) 25000 UT/250ML-% IV SOLN
950.0000 [IU]/h | INTRAVENOUS | Status: DC
  Administered 2021-12-18: 1100 [IU]/h via INTRAVENOUS
  Filled 2021-12-18: qty 250

## 2021-12-18 MED ORDER — ISOSORBIDE MONONITRATE ER 60 MG PO TB24
30.0000 mg | ORAL_TABLET | Freq: Every day | ORAL | Status: DC
  Administered 2021-12-18: 30 mg via ORAL
  Filled 2021-12-18: qty 1

## 2021-12-18 MED ORDER — MORPHINE SULFATE (PF) 4 MG/ML IV SOLN
4.0000 mg | INTRAVENOUS | Status: DC | PRN
  Administered 2021-12-19 (×3): 4 mg via INTRAVENOUS
  Filled 2021-12-18 (×3): qty 1

## 2021-12-18 MED ORDER — GLUCERNA 1.2 CAL PO LIQD: 237.0000 mL | Freq: Every day | ORAL | Status: DC

## 2021-12-18 MED ORDER — ATORVASTATIN CALCIUM 80 MG PO TABS
80.0000 mg | ORAL_TABLET | Freq: Every day | ORAL | Status: DC
  Administered 2021-12-18 – 2021-12-20 (×3): 80 mg via ORAL
  Filled 2021-12-18: qty 1
  Filled 2021-12-18: qty 4
  Filled 2021-12-18: qty 1

## 2021-12-18 MED ORDER — NITROGLYCERIN 2 % TD OINT
0.5000 [in_us] | TOPICAL_OINTMENT | Freq: Once | TRANSDERMAL | Status: AC
  Administered 2021-12-18: 0.5 [in_us] via TOPICAL
  Filled 2021-12-18: qty 1

## 2021-12-18 MED ORDER — CARVEDILOL 6.25 MG PO TABS
6.2500 mg | ORAL_TABLET | Freq: Two times a day (BID) | ORAL | Status: DC
  Administered 2021-12-19 – 2021-12-20 (×3): 6.25 mg via ORAL
  Filled 2021-12-18 (×3): qty 1

## 2021-12-18 MED ORDER — INSULIN ASPART 100 UNIT/ML IJ SOLN: 0.0000 [IU] | Freq: Every day | INTRAMUSCULAR | Status: DC

## 2021-12-18 MED ORDER — POLYETHYLENE GLYCOL 3350 17 G PO PACK
17.0000 g | PACK | Freq: Every day | ORAL | Status: DC
  Administered 2021-12-19: 17 g via ORAL
  Filled 2021-12-18 (×2): qty 1

## 2021-12-18 MED ORDER — SUCRALFATE 1 G PO TABS
1.0000 g | ORAL_TABLET | Freq: Three times a day (TID) | ORAL | Status: DC
  Administered 2021-12-18 – 2021-12-19 (×2): 1 g via ORAL
  Filled 2021-12-18 (×2): qty 1

## 2021-12-18 MED ORDER — OXYCODONE HCL 5 MG PO TABS
5.0000 mg | ORAL_TABLET | ORAL | Status: DC | PRN
  Administered 2021-12-18 – 2021-12-20 (×6): 5 mg via ORAL
  Filled 2021-12-18 (×6): qty 1

## 2021-12-18 MED ORDER — EZETIMIBE 10 MG PO TABS
10.0000 mg | ORAL_TABLET | Freq: Every day | ORAL | Status: DC
  Administered 2021-12-19 – 2021-12-20 (×2): 10 mg via ORAL
  Filled 2021-12-18 (×2): qty 1

## 2021-12-18 MED ORDER — TETRAHYDROZOLINE HCL 0.05 % OP SOLN: 1.0000 [drp] | Freq: Every day | OPHTHALMIC | Status: DC | PRN

## 2021-12-18 MED ORDER — APIXABAN 5 MG PO TABS: 5.0000 mg | ORAL_TABLET | Freq: Two times a day (BID) | ORAL | Status: DC

## 2021-12-18 MED ORDER — INSULIN ASPART 100 UNIT/ML IJ SOLN
0.0000 [IU] | Freq: Three times a day (TID) | INTRAMUSCULAR | Status: DC
  Filled 2021-12-18: qty 1

## 2021-12-18 MED ORDER — IOHEXOL 350 MG/ML SOLN
100.0000 mL | Freq: Once | INTRAVENOUS | Status: AC | PRN
  Administered 2021-12-18: 100 mL via INTRAVENOUS

## 2021-12-18 MED ORDER — RANOLAZINE ER 500 MG PO TB12
500.0000 mg | ORAL_TABLET | Freq: Two times a day (BID) | ORAL | Status: DC
  Administered 2021-12-18 – 2021-12-20 (×4): 500 mg via ORAL
  Filled 2021-12-18 (×4): qty 1

## 2021-12-18 MED ORDER — CLOPIDOGREL BISULFATE 75 MG PO TABS
75.0000 mg | ORAL_TABLET | Freq: Every morning | ORAL | Status: DC
  Administered 2021-12-19 – 2021-12-20 (×2): 75 mg via ORAL
  Filled 2021-12-18 (×2): qty 1

## 2021-12-18 MED ORDER — LABETALOL HCL 5 MG/ML IV SOLN
10.0000 mg | Freq: Once | INTRAVENOUS | Status: AC
  Administered 2021-12-18: 10 mg via INTRAVENOUS
  Filled 2021-12-18: qty 4

## 2021-12-18 MED ORDER — SENNOSIDES-DOCUSATE SODIUM 8.6-50 MG PO TABS
2.0000 | ORAL_TABLET | Freq: Two times a day (BID) | ORAL | Status: DC
  Administered 2021-12-18 – 2021-12-20 (×4): 2 via ORAL
  Filled 2021-12-18 (×4): qty 2

## 2021-12-18 MED ORDER — ADULT MULTIVITAMIN W/MINERALS CH
1.0000 | ORAL_TABLET | Freq: Every day | ORAL | Status: DC
  Administered 2021-12-18 – 2021-12-20 (×3): 1 via ORAL
  Filled 2021-12-18 (×3): qty 1

## 2021-12-18 MED ORDER — TAMSULOSIN HCL 0.4 MG PO CAPS
0.4000 mg | ORAL_CAPSULE | Freq: Every day | ORAL | Status: DC
  Administered 2021-12-19: 0.4 mg via ORAL
  Filled 2021-12-18: qty 1

## 2021-12-18 MED ORDER — ACETAMINOPHEN 325 MG PO TABS: 650.0000 mg | ORAL_TABLET | ORAL | Status: DC | PRN

## 2021-12-18 MED ORDER — PANTOPRAZOLE SODIUM 40 MG PO TBEC
40.0000 mg | DELAYED_RELEASE_TABLET | Freq: Every day | ORAL | Status: DC
  Administered 2021-12-18 – 2021-12-20 (×3): 40 mg via ORAL
  Filled 2021-12-18 (×3): qty 1

## 2021-12-18 NOTE — H&P (Addendum)
[facility] History and Physical  Bryan Scott ATF:[id number] DOB: 25-Oct-1945 DOA: 12/18/2021  Referring physician: ED  PCP: [name], MD   Patient is coming from: Skilled nursing facility  Chief Complaint: Chest pain  HPI:  Patient is a 76 years old male with history of coronary artery disease status post CABG, history of bladder cancer, chronic pain syndrome, GERD, hyperlipidemia, hypertension, history of paroxysmal atrial fibrillation, type 2 diabetes, in the past presented to the hospital from [facility] facility with complaints of chest pain that started around 9 AM today.  Patient described pain as central in location, tight as if somebody sitting on his chest, with radiation to the  back had some mild diaphoresis and mild dizziness.  Subsequently, he had a passing out spell x2 which was preceded by diaphoresis and dizziness..  Patient complains of mild shortness of breath but denies dyspnea, fever or chills.  Denies any nausea, vomiting or abdominal pain.  Denied urinary urgency, frequency or dysuria.  Patient states that he has been using a wheelchair for ambulation at the skilled nursing facility.  He denies any pulling muscle of his chest but he does wheel his wheelchair.  In the ED, patient was noted to have a elevated blood pressure, hemoglobin was 17.3.  INR 1.3.  Sodium 134.  Bilirubin elevated at 3.1.  EKG showed normal sinus rhythm with nonspecific ST changes.  Unchanged from previous EKG in February.  Chest x-ray showed no acute findings.  CT a of the chest without evidence of dissection or acute findings.  In the ED, patient received nitroglycerin ointment slightly improved his pain.  Due to extensive cardiac history and ongoing pain in the ED with syncope patient was then considered for admission to hospital for further observation.  Assessment and plan.  Principal Problem:   Syncope Active Problems:   Abnormal EKG   PAF (paroxysmal atrial fibrillation) (HCC)    DM (diabetes mellitus), type 2 with peripheral vascular complications (HCC)   Hyperlipidemia   Essential hypertension   CAD (coronary artery disease)   Chronic pain syndrome   Hyperlipidemia associated with type 2 diabetes mellitus (HCC)   Chest pain  Syncope preceded by dizziness lightheadedness diaphoresis.  Could be vagal related/orthostatic.  Check orthostatic blood pressure during hospitalization.  Check 2D echocardiogram, telemetry monitor.  Chest pain with radiation to the back.  Concern for unstable angina.  Has reproducible tenderness over the chest wall as well.  History of coronary artery disease status post CABG.  Patient is a high risk.  Troponins negative so far.  EKG nonspecific changes no significant change compared to previous.  We will continue to monitor overnight.  Telemetry monitor.  Trend further troponins. We will check 2D echocardiogram to assess for regional wall motion abnormality.  Long-acting nitro was started in the ED will be continued.  Patient is also on Lipitor, Coreg, Plavix and ranolazine.  He was however not taking Coreg, ranolazine and Imdur at the skilled nursing facility as per the [facility].  We will continue while in the hospital.   Patient states that he does have a cardiology at the [facility]. I communicated with cardiology on-call Dr. Curt Bears, [facility] cardiology who recommends initiation of heparin drip and history of Eliquis the patient was on at home for now.  Will initiate heparin drip.  Cardiology to follow the patient in AM.    History of paroxysmal atrial fibrillation.  On Eliquis and Coreg as outpatient.  Continue Coreg.  Will be on heparin drip  for now.  Rate controlled.  Essential hypertension tensive urgency.  Continue Coreg from home.  Continue long-acting nitrate.  Blood pressure appears to be slightly better now.  We will continue to follow.  Hyperlipidemia continue Lipitor.  Type 2 diabetes mellitus.  Patient is on metformin and Jardiance at home.  We will  continue sliding scale insulin, diabetic diet.  Hold oral hypoglycemics at this time.  Chronic pain syndrome.  Continue duloxetine.  Gabapentin listed as allergy   DVT Prophylaxis: Heparin drip  Review of Systems:  All systems were reviewed and were negative unless otherwise mentioned in the HPI   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    Angioedema   Back injuries    Bladder cancer ([place])    Cancer ([place])    Carotid artery disease ([place])    > 75% bilateral ICA stenoses on 01/2013 CT   Chronic pain syndrome    Coronary artery disease    a. 2007 s/p CABG; b. 06/2020 PCI: LM 40ost, LAD nl, LCX 100p, RCA 122m- fills via L->R collats. VG->RPDA 967m (3.5x18 Resolute Onyx DES), VG->Diag 100, VG->OM2 ok.   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 08/2020 Echo: EF 70-75%, no rwma, Gr1 DD, nl RV fxn, triv MR, mild-mod Ao sclerosis.   Paroxysmal atrial fibrillation (HCC)    On Xarelto anticoagulation   Type 2 diabetes mellitus ([facility]   A1C 6.8% in 01/2013   Past Surgical History:  Procedure Laterality Date   CARDIAC CATHETERIZATION     CORONARY ARTERY BYPASS GRAFT  2007   CORONARY STENT INTERVENTION N/A 07/13/2020   Procedure: CORONARY STENT INTERVENTION;  Surgeon: EnNelva BushMD;  [place];  Service: Cardiovascular;  Laterality: N/A;   ESOPHAGOGASTRODUODENOSCOPY (EGD) WITH PROPOFOL N/A 09/20/2020   Procedure: ESOPHAGOGASTRODUODENOSCOPY (EGD) WITH PROPOFOL;  Surgeon: StLadene ArtistMD;  [facility];  Service: Endoscopy;  Laterality: N/A;   LEFT HEART CATH AND CORS/GRAFTS ANGIOGRAPHY N/A 07/13/2020   Procedure: LEFT HEART CATH AND CORS/GRAFTS ANGIOGRAPHY;  Surgeon: GoMinna MerrittsMD;  [place];  Service:  Cardiovascular;  Laterality: N/A;    Social History:  reports that he has quit smoking. His smoking use included cigarettes. He has a 30.00 pack-year smoking history. He has never used smokeless tobacco. He reports that he does not currently use alcohol. He reports that he does not use drugs.  Allergies  Allergen Reactions   Aspergum [Aspirin] Anaphylaxis   Aspirin Swelling   Lisinopril Anaphylaxis   Angiotensin Receptor Blockers     Never taken but PMH of angioedema with ACE-I ( Lisinopril)   Duloxetine    Gabapentin    Heparin Other (See Comments)    Reaction:  Bleeding     Family History  Problem Relation Age of Onset   Heart disease Mother [id number]     Deceased   CVA Mother    Seizures Father [id number]     Deceased from complications with seizures     Prior to Admission medications   Medication Sig Start Date End Date Taking? Authorizing Provider  apixaban (ELIQUIS) 5 MG TABS tablet Take 5 mg by mouth 2 (two) times daily. 01/06/21  01/07/22 Yes [provider]  atorvastatin (LIPITOR) 80 MG tablet Take 1 tablet (80 mg total) by mouth daily. 10/11/20  Yes Medina-Vargas, Monina C, NP  empagliflozin (JARDIANCE) 25 MG TABS tablet Take 1 tablet (25 mg total) by mouth daily. 10/11/20  Yes Medina-Vargas, Monina C, NP  ezetimibe (ZETIA) 10 MG tablet Take 1 tablet (10 mg total) by mouth daily. 10/11/20  Yes Medina-Vargas, Monina C, NP  oxyCODONE (ROXICODONE) 5 MG immediate release tablet Take 5 mg by mouth every 4 (four) hours as needed for severe pain.   Yes [provider]  tamsulosin (FLOMAX) 0.4 MG CAPS capsule Take 0.4 mg by mouth.   Yes [provider]  acetaminophen (TYLENOL) 325 MG tablet Take 2 tablets (650 mg total) by mouth every 6 (six) hours. Patient not taking: Reported on 12/18/2021 09/25/20   Lurline Del, DO  acetaminophen (TYLENOL) 325 MG tablet Take 2 tablets (650 mg total) by mouth every 4 (four) hours as needed for mild pain (or temp > 37.5 C (99.5 F)).  08/02/21   Loletha Grayer, MD  carvedilol (COREG) 6.25 MG tablet Take 1 tablet (6.25 mg total) by mouth 2 (two) times daily with a meal. Patient not taking: Reported on 12/18/2021 10/11/20   Medina-Vargas, Monina C, NP  clopidogrel (PLAVIX) 75 MG tablet Take 1 tablet (75 mg total) by mouth every morning. Patient not taking: Reported on 12/18/2021 10/11/20   Medina-Vargas, Monina C, NP  diphenhydrAMINE (BENADRYL) 25 mg capsule Take 1 capsule (25 mg total) by mouth every 6 (six) hours as needed for itching or allergies (throat tightning feeling). Patient not taking: Reported on 12/18/2021 08/02/21   Loletha Grayer, MD  DULoxetine (CYMBALTA) 30 MG capsule Take 1 capsule (30 mg total) by mouth daily. Patient not taking: Reported on 12/18/2021 10/11/20   Medina-Vargas, Monina C, NP  gabapentin (NEURONTIN) 100 MG capsule Take 1 capsule (100 mg total) by mouth 3 (three) times daily. Patient not taking: Reported on 12/18/2021 08/02/21   Loletha Grayer, MD  isosorbide mononitrate (IMDUR) 60 MG 24 hr tablet Take 1 tablet (60 mg total) by mouth daily. Patient not taking: Reported on 12/18/2021 08/02/21   Loletha Grayer, MD  melatonin 5 MG TABS Take 1 tablet (5 mg total) by mouth at bedtime. Patient not taking: Reported on 12/18/2021 10/11/20   Medina-Vargas, Monina C, NP  metFORMIN (GLUCOPHAGE) 500 MG tablet Take 1 tablet (500 mg total) by mouth daily with breakfast. Patient not taking: Reported on 12/18/2021 08/02/21   Loletha Grayer, MD  Multiple Vitamin (MULTIVITAMIN WITH MINERALS) TABS tablet Take 1 tablet by mouth daily. 08/10/20   Fritzi Mandes, MD  nitroGLYCERIN (NITROSTAT) 0.4 MG SL tablet Place 1 tablet (0.4 mg total) under the tongue every 5 (five) minutes as needed for chest pain. 10/11/20   Medina-Vargas, Monina C, NP  Nutritional Supplements (FEEDING SUPPLEMENT, GLUCERNA 1.2 CAL,) LIQD Take 237 mLs by mouth daily in the afternoon.    [provider]  ondansetron (ZOFRAN) 4 MG tablet Take 1 tablet (4 mg  total) by mouth daily as needed for nausea or vomiting. 02/19/21 02/19/22  Shelly Coss, MD  pantoprazole (PROTONIX) 40 MG tablet Take 1 tablet (40 mg total) by mouth daily. Patient not taking: Reported on 12/18/2021 10/11/20   Medina-Vargas, Monina C, NP  polyethylene glycol (MIRALAX / GLYCOLAX) 17 g packet Take 17 g by mouth daily. 09/25/20   Lurline Del, DO  ranolazine (RANEXA) 500 MG 12 hr tablet Take 1 tablet (500 mg total) by  mouth 2 (two) times daily. Patient not taking: Reported on 12/18/2021 02/19/21   Shelly Coss, MD  senna-docusate (SENOKOT-S) 8.6-50 MG tablet Take 2 tablets by mouth 2 (two) times daily. Patient not taking: Reported on 12/18/2021 09/06/20   Sharen Hones, MD  sucralfate (CARAFATE) 1 g tablet Take 1 tablet (1 g total) by mouth 4 (four) times daily -  with meals and at bedtime. Patient not taking: Reported on 12/18/2021 10/11/20   Medina-Vargas, Monina C, NP  Tetrahydrozoline HCl (VISINE OP) Place 1 drop into both eyes daily as needed (dry red eyes).    [provider]    Physical Exam: Vitals:   12/18/21 1530 12/18/21 1600 12/18/21 1730 12/18/21 1800  BP: (!) 184/92 (!) 198/94 (!) 142/78 (!) 121/58  Pulse: 64 71 66 66  Resp: '15 13 16 19  '$ Temp:      TempSrc:      SpO2: 96% 96% 95% 94%  Weight:      Height:       Wt Readings from Last 3 Encounters:  12/18/21 84.7 kg  07/24/21 84.7 kg  04/10/21 71.7 kg   Body mass index is 25.33 kg/m.  General:  Average built, not in obvious distress, elderly male HENT: Normocephalic, pupils equally reacting to light and accommodation.  No scleral pallor or icterus noted. Oral mucosa is moist.  Chest: Sternal scar from previous CABG, clear breath sounds.  Diminished breath sounds bilaterally. No crackles or wheezes.  Tenderness over the costochondral junction on the right chest wall. CVS: S1 &S2 heard. No murmur.  Regular rate and rhythm. Abdomen: Soft, nontender, nondistended.  Bowel sounds are heard.  Extremities: No  cyanosis, clubbing or edema.  Peripheral pulses are palpable. Psych: Alert, awake and oriented, normal mood CNS:  No cranial nerve deficits.  Moves extremities. Skin: Warm and dry.  No rashes noted.  Labs on Admission:   CBC: Recent Labs  Lab 12/18/21 1217  WBC 9.3  NEUTROABS 6.5  HGB 17.3*  HCT 51.3  MCV 96.4  PLT 734    Basic Metabolic Panel: Recent Labs  Lab 12/18/21 1217  NA 134*  K 4.6  CL 100  CO2 24  GLUCOSE 132*  BUN 18  CREATININE 0.92  CALCIUM 9.6    Liver Function Tests: Recent Labs  Lab 12/18/21 1217  AST 30  ALT 24  ALKPHOS 75  BILITOT 3.1*  PROT 7.7  ALBUMIN 4.2   No results for input(s): "LIPASE", "AMYLASE" in the last 168 hours. No results for input(s): "AMMONIA" in the last 168 hours.  Cardiac Enzymes: No results for input(s): "CKTOTAL", "CKMB", "CKMBINDEX", "TROPONINI" in the last 168 hours.  BNP (last 3 results) Recent Labs    02/17/21 1147  BNP 49.7    ProBNP (last 3 results) No results for input(s): "PROBNP" in the last 8760 hours.  CBG: No results for input(s): "GLUCAP" in the last 168 hours.  Lipase     Component Value Date/Time   LIPASE 300 (H) 02/19/2021 0458   LIPASE 129 08/05/2012 1811     Urinalysis    Component Value Date/Time   COLORURINE STRAW (A) 07/24/2021 2048   APPEARANCEUR CLEAR (A) 07/24/2021 2048   APPEARANCEUR Clear 10/15/2013 2200   LABSPEC >1.046 (H) 07/24/2021 2048   LABSPEC 1.012 10/15/2013 2200   PHURINE 6.0 07/24/2021 2048   GLUCOSEU >=500 (A) 07/24/2021 2048   GLUCOSEU Negative 10/15/2013 2200   HGBUR NEGATIVE 07/24/2021 2048   BILIRUBINUR NEGATIVE 07/24/2021 2048   BILIRUBINUR Negative  10/15/2013 [place] 07/24/2021 2048   PROTEINUR NEGATIVE 07/24/2021 2048   NITRITE NEGATIVE 07/24/2021 2048   LEUKOCYTESUR NEGATIVE 07/24/2021 2048   LEUKOCYTESUR Negative 10/15/2013 2200     Drugs of Abuse     Component Value Date/Time   LABOPIA NONE DETECTED 07/24/2021 2048    COCAINSCRNUR NONE DETECTED 07/24/2021 2048   LABBENZ NONE DETECTED 07/24/2021 2048   AMPHETMU NONE DETECTED 07/24/2021 2048   THCU NONE DETECTED 07/24/2021 2048   LABBARB NONE DETECTED 07/24/2021 2048      Radiological Exams on Admission: CT Angio Chest/Abd/Pel for Dissection W and/or Wo Contrast  Result Date: 12/18/2021 CLINICAL DATA:  A 76 year old male presents for evaluation of suspected aortic dissection with sudden onset of back and chest pain. EXAM: CT ANGIOGRAPHY CHEST, ABDOMEN AND PELVIS TECHNIQUE: Non-contrast CT of the chest was initially obtained. Multidetector CT imaging through the chest, abdomen and pelvis was performed using the standard protocol during bolus administration of intravenous contrast. Multiplanar reconstructed images and MIPs were obtained and reviewed to evaluate the vascular anatomy. RADIATION DOSE REDUCTION: This exam was performed according to the departmental dose-optimization program which includes automated exposure control, adjustment of the mA and/or kV according to patient size and/or use of iterative reconstruction technique. CONTRAST:  158m OMNIPAQUE IOHEXOL 350 MG/ML SOLN COMPARISON:  July 24, 2021. FINDINGS: CTA CHEST FINDINGS Cardiovascular: Normal heart size. No pericardial effusion or pericardial thickening. Post median sternotomy for coronary revascularization as before. Normal caliber of the thoracic aorta. Aortic atherosclerosis both calcified and noncalcified with similar appearance. No aneurysmal dilation. No dissection or signs of vasculitis. Dedicated origin of LEFT vertebral either from the LEFT subclavian at the origin of LEFT subclavian. Precontrast imaging without signs of intramural hematoma. Study is negative for central or lobar level pulmonary embolism. Mediastinum/Nodes: No thoracic inlet, axillary, mediastinal or hilar adenopathy. Esophagus grossly normal. Lungs/Pleura: No effusion. No consolidative changes. Airways are patent. No  pneumothorax. Nodular area in the RIGHT upper lobe inferiorly along the pleural surface 8 x 3 mm (image 84/6) biapical pleural and parenchymal scarring. Musculoskeletal: See below for full musculoskeletal details. Review of the MIP images confirms the above findings. CTA ABDOMEN AND PELVIS FINDINGS VASCULAR Aorta: Aortic atherosclerosis both calcified and noncalcified without signs of aneurysmal dilation. Celiac: Celiac with atherosclerotic changes at the origin of the celiac artery, no substantial narrowing. Dedicated origin of hepatic artery. SMA: Soft plaque with segmental narrowing of the SMA showing moderate narrowing at the level of jejunal branches that is similar to prior imaging. Vessel is patent without signs of dissection or vasculitis and without change from imaging in February. Renals: Single of renal arteries bilaterally which are widely patent. IMA: Patent without evidence of aneurysm, dissection, vasculitis or significant stenosis. Inflow: Patent without evidence of aneurysm, dissection, vasculitis or significant stenosis. Veins: No obvious venous abnormality within the limitations of this arterial phase study. Outflow into the upper thigh is preserved with atherosclerotic changes in the iliac vasculature and moderate to marked narrowing of the RIGHT common iliac (image 168/5 due to mixture of calcified and noncalcified plaque, this is at the terminal aspect of the stented vessel just proximal to the iliac bifurcation. Review of the MIP images confirms the above findings. NON-VASCULAR Hepatobiliary: Smooth hepatic contours. No pericholecystic stranding. No change in mild prominence of the common bile duct between 5 and 6 mm. No gross intrahepatic biliary duct distension. Pancreas: Normal, without mass, inflammation or ductal dilatation. Spleen: Normal. Adrenals/Urinary Tract: Adrenal glands are normal. Nephrolithiasis  on the RIGHT is unchanged grossly 12 mm calculus in the interpolar RIGHT kidney. No  suspicious renal lesion, hydronephrosis or ureteral calculi. Stomach/Bowel: Normal appendix. No acute gastrointestinal process.  Stool fills much of the colon. 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 Findings are similar to previous imaging. No adenopathy in the abdomen or in the pelvis. Reproductive: Moderate prostatomegaly. Other: No ascites. Musculoskeletal: No acute bone finding. No destructive bone process. Spinal degenerative changes. Moderate narrowing of the central canal at L5-S1 similar to previous imaging. Signs of prior trauma to the LEFT hemipelvis, iliac bone similar to previous imaging. Evidence of prior spinal fusion at L3-4 and L4-5. Review of the MIP images confirms the above findings. IMPRESSION: 1. No evidence of thoracic or abdominal aortic aneurysm or dissection. Marked vascular disease and signs of coronary revascularization and prior vascular stenting as before. 2. Atherosclerotic Narrowing of the SMA with soft plaque is unchanged and at least moderate at the level of jejunal branches. 3. No signs of central or lobar level pulmonary embolism. 4. Degenerative changes of the spine with evidence of prior spinal fusion moderate central canal narrowing at L5-S1. 5. Moderate to marked narrowing of the RIGHT common iliac due to mixture of calcified and noncalcified plaque. 6. 6 mm right solid pulmonary nodule within the upper lobe. Recommend a non-contrast Chest CT at 6-12 months. If patient is high risk for malignancy, recommend an additional non-contrast Chest CT at 18-24 months; if patient is low risk for malignancy a non-contrast Chest CT at 18-24 months is optional. these guidelines do not apply to immunocompromised patients and patients with cancer. Follow up in patients with significant comorbidities as clinically warranted. For lung cancer screening, adhere to Lung-RADS guidelines. Reference: Radiology. 2017; 284(1):228-43. 7.  Nonobstructive RIGHT renal calculus.  This measures 12 mm. 8. Moderate prostatomegaly. Aortic Atherosclerosis (ICD10-I70.0). Electronically Signed   By: Zetta Bills M.D.   On: 12/18/2021 13:39   DG Chest Port 1 View  Result Date: 12/18/2021 CLINICAL DATA:  Chest pain with nausea and dizziness EXAM: PORTABLE CHEST 1 VIEW COMPARISON:  03/21/2021 FINDINGS: Lower cervical plate and screw fixator. Prior CABG. Heart size within normal limits. Left proximal humeral plate and screw fixator. No blunting of the costophrenic angles. Minimal scarring at the left lung base. No acute thoracic findings. IMPRESSION: 1. No acute/active thoracic findings. 2. Prior CABG. 3. Minimal scarring at the left lung base. Electronically Signed   By: Van Clines M.D.   On: 12/18/2021 12:42    EKG: Personally reviewed by me which shows nonspecific changes.   Consultant: Cardiology  Code Status: Full code  Microbiology none  Antibiotics: None  Family Communication:  Patients' condition and plan of care including tests being ordered have been discussed with the patient and the patient's sister on the phone who indicate understanding and agree with the plan.   Status is: Observation  Severity of Illness: The appropriate patient status for this patient is OBSERVATION. Observation status is judged to be reasonable and necessary in order to provide the required intensity of service to ensure the patient's safety. The patient's presenting symptoms, physical exam findings, and initial radiographic and laboratory data in the context of their medical condition is felt to place them at decreased risk for further clinical deterioration. Furthermore, it is anticipated that the patient will be medically stable for discharge from the hospital within 2 midnights of admission.   Signed, Flora Lipps, MD [facility]  12/18/2021     

## 2021-12-18 NOTE — ED Provider Triage Note (Signed)
Emergency Medicine Provider Triage Evaluation Note  Bryan Scott , a 76 y.o. male  was evaluated in triage.  Pt complains of CP and upper back pain that began this morning. Reports that he felt nauseated and dizzy and then began sweating and made his way to the bed and passed out. Reports that he was sitting on his bed and fell back and woke up later, not sure how much later. 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   Non-intractable vomiting    Dehydration    Fall    Injury    Malnutrition of moderate degree 09/19/2020   Protein-calorie malnutrition ([place]) 09/18/2020   Syncope 09/17/2020   History of pancreatitis    Cervical radiculopathy    Acute pancreatitis 08/07/2020   Hyperlipidemia associated with type 2 diabetes mellitus ([place]) 08/07/2020   NSTEMI (non-ST elevated myocardial infarction) ([place]) 07/11/2020   AKI (acute kidney injury) ([place]) 55/73/2202   Acute metabolic encephalopathy 54/27/0623   GERD (gastroesophageal reflux disease) 09/09/2019   CAD (coronary artery disease) 09/09/2019   Chronic pain syndrome 09/09/2019   Leukocytosis 09/09/2019   Shortness of breath    Hyponatremia 09/26/2016   Left-sided chest pain 09/22/2016   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   Abnormal EKG 09/21/2016   Chronic pain 09/21/2016   PAF (paroxysmal atrial fibrillation) ([place]) 09/21/2016   Essential hypertension 05/02/2016   Carotid stenosis 05/02/2016   CAD in native artery    Atypical chest pain    DM (diabetes mellitus), type 2 with peripheral vascular complications (HCC)    Hyperlipidemia     Pain of left upper extremity    Angina pectoris ([place])    Abdominal pain    Chest pain, central 03/13/2015   Hypertensive urgency 03/13/2015   Spinal stenosis 01/10/2012     Review of Systems  Positive: CP, back pain, nausea, SOB Negative: Abd pain  Physical Exam  There were no vitals taken for this visit. Gen:   Awake, no distress   Resp:  Normal effort  MSK:   Moves extremities without difficulty  Other:  Equal radial pulses. LUE 148/107 RUE 113/93  Medical Decision Making  Medically screening exam initiated at 11:59 AM.  Appropriate orders placed.  Ladona Horns was informed that the remainder of the evaluation will be completed by another provider, this initial triage assessment does not replace that evaluation, and the importance of remaining in the ED until their evaluation is complete.     Marquette Old, PA-C 12/18/21 1207

## 2021-12-18 NOTE — Consult Note (Signed)
ANTICOAGULATION CONSULT NOTE   Pharmacy Consult for heparin Indication: chest pain/ACS  Allergies  Allergen Reactions   Aspergum [Aspirin] Anaphylaxis   Aspirin Swelling   Lisinopril Anaphylaxis   Angiotensin Receptor Blockers     Never taken but PMH of angioedema with ACE-I ( Lisinopril)   Duloxetine    Gabapentin    Heparin Other (See Comments)    Reaction:  Bleeding     Patient Measurements: Height: 6' (182.9 cm) Weight: 84.7 kg (186 lb 11.7 oz) IBW/kg (Calculated) : 77.6 Heparin Dosing Weight: 84.7 kg  Vital Signs: Temp: 97.6 F (36.4 C) (07/05 1204) Temp Source: Oral (07/05 1204) BP: 121/58 (07/05 1800) Pulse Rate: 66 (07/05 1800)  Labs: Recent Labs    12/18/21 1217 12/18/21 1404  HGB 17.3*  --   HCT 51.3  --   PLT 174  --   LABPROT 15.6*  --   INR 1.3*  --   CREATININE 0.92  --   TROPONINIHS 5 4    Estimated Creatinine Clearance: 75 mL/min (by C-G formula based on SCr of 0.92 mg/dL).   Medical History: 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    Angioedema   Back injuries    Bladder cancer (HCC)    Cancer (HCC)    Carotid artery disease ([place])    > 75% bilateral ICA stenoses on 01/2013 CT   Chronic pain syndrome    Coronary artery disease    a. 2007 s/p CABG; b. 06/2020 PCI: LM 40ost, LAD nl, LCX 100p, RCA 142m- fills via L->R collats. VG->RPDA 961m (3.5x18 Resolute Onyx DES), VG->Diag 100, VG->OM2 ok.   Gastroparesis diabeticorum ([facility]04/01/2021   GERD (gastroesophageal reflux disease)    Heparin allergy    Bleeding   History of tobacco use    Hx of CABG    Hyperlipidemia    Hypertension    Ischemic cardiomyopathy    a. 08/2020 Echo: EF 70-75%, no rwma, Gr1 DD, nl RV fxn, triv MR, mild-mod Ao sclerosis.   Paroxysmal atrial fibrillation (HCC)    On Xarelto anticoagulation   Type 2 diabetes mellitus ([place]   A1C 6.8% in 01/2013    Medications:  Eliquis 5 mg BID, last dose 12/18/21 at 1000  Assessment: [age]ears  old male with history of CAD s/p CABG, history of bladder cancer, chronic pain syndrome, GERD, HLD, HTN, a fib on Eliquis, and T2DM who presented to the ED with complaints of chest pain. Pharmacy has been consulted for heparin dosing for ACS.   Baseline labs: Hgb 15.6, plts 174, INR 1.3, aPTT 35, HL >1.10  Goal of Therapy:  Heparin level 0.3-0.7 units/ml aPTT 66-102 seconds Monitor platelets by anticoagulation protocol: Yes   Plan:  Start heparin infusion at 1100 units/hr @ 2200 without bolus  Check aPTT level in 8 hours and daily while on heparin Will use aPTT to monitor and adjust heparin until aPTT and HL correlate  Continue to monitor H&H and platelets  MoDarnelle BosPharmD 12/18/2021,7:16 PM

## 2021-12-18 NOTE — ED Provider Notes (Signed)
Patient's repeat troponin is stable but still complaining of moderate to severe midsternal nonradiating chest pain.  Given history of CABG and extensive vascular history presentation is concerning for unstable angina.  Would not happen neuritis as he is already on Eliquis.  Will give IV narcotic medication for additional pain will give Imdur for additional nitrates as he does feel some improvement with Nitropaste.  Will give IV labetalol as he is still hypertensive will consult hospitalist for admission.   Merlyn Lot, MD 12/18/21 [phone]

## 2021-12-18 NOTE — ED Provider Notes (Signed)
[facility] Provider Note    Event Date/Time   First MD Initiated Contact with Patient 12/18/21 1212     (approximate)   History   Chest Pain   HPI - RONAL MAYBURY is a 76 y.o. male who tells me he had chest pain this morning it began radiating to his back.  He got sweaty and passed out or almost passed out while he was laying down.  He did try to get up again and got woozy and then laid back down again.  He said he did not want to come in but it was brought here anyway.  His pain is now better but he still feels woozy like he might pass out.  Pain is not completely gone.      Physical Exam   Triage Vital Signs: ED Triage Vitals  Enc Vitals Group     BP 12/18/21 1204 (!) 113/93     Pulse Rate 12/18/21 1204 75     Resp 12/18/21 1204 18     Temp 12/18/21 1204 97.6 F (36.4 C)     Temp Source 12/18/21 1204 Oral     SpO2 12/18/21 1204 96 %     Weight 12/18/21 1205 186 lb 11.7 oz (84.7 kg)     Height 12/18/21 1205 6' (1.829 m)     Head Circumference --      Peak Flow --      Pain Score 12/18/21 1205 10     Pain Loc --      Pain Edu? --      Excl. in [place]? --     Most recent vital signs: Vitals:   12/18/21 1430 12/18/21 1500  BP: (!) 162/84 (!) 182/93  Pulse: 69 68  Resp: 16 18  Temp:    SpO2: 94% 96%     General: Awake, no distress.  CV:  Good peripheral perfusion.  Heart regular rate and rhythm no audible murmurs Resp:  Normal effort.  Lungs are clear Abd:  No distention.  Soft and nontender Extremities no edema   ED Results / Procedures / Treatments   Labs (all labs ordered are listed, but only abnormal results are displayed) Labs Reviewed  CBC WITH DIFFERENTIAL/PLATELET - Abnormal; Notable for the following components:      Result Value   Hemoglobin 17.3 (*)    All other components within normal limits  PROTIME-INR - Abnormal; Notable for the following components:   Prothrombin Time 15.6 (*)    INR 1.3 (*)    All other  components within normal limits  COMPREHENSIVE METABOLIC PANEL - Abnormal; Notable for the following components:   Sodium 134 (*)    Glucose, Bld 132 (*)    Total Bilirubin 3.1 (*)    All other components within normal limits  TROPONIN I (HIGH SENSITIVITY)  TROPONIN I (HIGH SENSITIVITY)     EKG EKG read and interpreted by me shows normal sinus rhythm rate of 76 normal axis no acute changes EKG #2 read interpreted by me shows normal sinus rhythm rate of 68 normal axis no acute ST-T wave changes there is some slight ST T wave downsloping T wave inversion in 1 and L which was present in February.  It is present on both of these EKGs.    RADIOLOGY Chest x-ray read by radiology reviewed reviewed and interpreted by me shows no acute disease. CT angio read by radiology reviewed and interpreted by me shows no obvious dissection  PROCEDURES:  Critical Care performed:   Procedures   MEDICATIONS ORDERED IN ED: Medications  nitroGLYCERIN (NITROGLYN) 2 % ointment 0.5 inch (0.5 inches Topical Given 12/18/21 1242)  iohexol (OMNIPAQUE) 350 MG/ML injection 100 mL (100 mLs Intravenous Contrast Given 12/18/21 1302)  nitroGLYCERIN (NITROGLYN) 2 % ointment 0.5 inch (0.5 inches Topical Given 12/18/21 1329)     IMPRESSION / MDM / ASSESSMENT AND PLAN / ED COURSE  I reviewed the triage vital signs and the nursing notes. ----------------------------------------- 3:26 PM on 12/18/2021 ----------------------------------------- Second troponin is pending.  Patient has known cardiac disease.  This could be angina.  It might be worthwhile admitting him even if his second troponin is negative improve his symptoms which improved some with nitro.  I will sign the patient out however to the oncoming physician.  Final disposition per them. Differential diagnosis includes, but is not limited to, this could be angina or it could be chest wall pain there is no sign of pericarditis.  It does not sound like pleurisy.  He  does not have a pneumonia.  Patient's presentation is most consistent with acute complicated illness / injury requiring diagnostic workup.  The patient is on the cardiac monitor to evaluate for evidence of arrhythmia and/or significant heart rate changes.  None were seen      FINAL CLINICAL IMPRESSION(S) / ED DIAGNOSES   Final diagnoses:  Nonspecific chest pain     Rx / DC Orders   ED Discharge Orders     None        Note:  This document was prepared using Dragon voice recognition software and may include unintentional dictation errors.   Nena Polio, MD 12/18/21 [phone]

## 2021-12-18 NOTE — Hospital Course (Addendum)
Patient is a 76 years old male with history of coronary artery disease status post CABG, history of bladder cancer, chronic pain syndrome, GERD, hyperlipidemia, hypertension, history of paroxysmal atrial fibrillation, type 2 diabetes, in the past presented to the hospital from [facility] facility with complaints of chest pain that started around 9 AM today.  Patient described pain as central in location, tight as if somebody sitting on his chest, with radiation to the  back had some mild diaphoresis and mild dizziness.  Subsequently, he had a passing out spell x2 which was preceded by diaphoresis and dizziness..  Patient complains of mild shortness of breath but denies dyspnea, fever or chills.  Denies any nausea, vomiting or abdominal pain.  Denied urinary urgency, frequency or dysuria.  Patient states that he has been using a wheelchair for ambulation at the skilled nursing facility.  He denies any pulling muscle of his chest but he does wheel his wheelchair.  In the ED, patient was noted to have a elevated blood pressure, hemoglobin was 17.3.  INR 1.3.  Sodium 134.  Bilirubin elevated at 3.1.  EKG showed normal sinus rhythm with nonspecific ST changes.  Unchanged from previous EKG in February.  Chest x-ray showed no acute findings.  CT a of the chest without evidence of dissection or acute findings.  In the ED, patient received nitroglycerin ointment slightly improved his pain.  Due to extensive cardiac history and ongoing pain in the ED with syncope patient was then considered for admission to hospital for further observation.  Assessment and plan.  Principal Problem:   Syncope Active Problems:   Abnormal EKG   PAF (paroxysmal atrial fibrillation) (HCC)   DM (diabetes mellitus), type 2 with peripheral vascular complications (HCC)   Hyperlipidemia   Essential hypertension   CAD (coronary artery disease)   Chronic pain syndrome   Hyperlipidemia associated with type 2 diabetes mellitus (HCC)   Chest  pain  Syncope preceded by dizziness lightheadedness diaphoresis.  Could be vagal related/orthostatic.  Check orthostatic blood pressure during hospitalization.  Check 2D echocardiogram, telemetry monitor.  Chest pain with radiation to the back.  Concern for unstable angina.  Has reproducible tenderness over the chest wall as well.  History of coronary artery disease status post CABG.  Patient is a high risk.  Troponins negative so far.  EKG nonspecific changes no significant change compared to previous.  We will continue to monitor overnight.  Telemetry monitor.  Trend further troponins.  Patient is already on Eliquis so no heparin was given.  We will check 2D echocardiogram to assess for regional wall motion abnormality.  Long-acting nitro was started in the ED will be continued.  Patient is also on Lipitor, Coreg, Plavix and ranolazine.  He was however not taking Coreg, ranolazine and Imdur at the skilled nursing facility as per the [facility].  We will continue while in the hospital.   Patient states that he does have a cardiology at the [facility]. communicated with Dr. Curt Bears, [facility] cardiology who recommends initiation of heparin drip.  Cardiology to follow the patient in AM.    History of paroxysmal atrial fibrillation.  On Eliquis and Coreg as outpatient.  We will continue while in the hospital.  Rate controlled.  Essential hypertension tensive urgency.  Continue Coreg from home.  Continue long-acting nitrate.  Blood pressure appears to be slightly better now.  We will continue to follow.  Hyperlipidemia continue Lipitor.  Type 2 diabetes mellitus.  Patient is on metformin and Jardiance at  home.  We will continue sliding scale insulin, diabetic diet.  Hold oral hypoglycemics at this time.  Chronic pain syndrome.  Continue duloxetine.  Gabapentin listed as allergy

## 2021-12-18 NOTE — ED Notes (Signed)
Resumed care from [place] rn.  Pt alert.   Pt has chest and back pain.   Iv in place.  Nsr on monitor.

## 2021-12-18 NOTE — ED Triage Notes (Signed)
Pr here via [facility] with CP. Pt is a resident at [facility]. Pt state he began having CP at 0900 today. Pt states pain is centered and radiates to his left arm and back. Pt states he passed out at one point. Pt has a hx of a CABG. Allergic to aspirin. Pt given zofran at facility.  152/73 130-cbg 97.2-axillary

## 2021-12-19 ENCOUNTER — Observation Stay (HOSPITAL_BASED_OUTPATIENT_CLINIC_OR_DEPARTMENT_OTHER): Payer: No Typology Code available for payment source

## 2021-12-19 ENCOUNTER — Observation Stay (HOSPITAL_BASED_OUTPATIENT_CLINIC_OR_DEPARTMENT_OTHER)
Admit: 2021-12-19 | Discharge: 2021-12-19 | Disposition: A | Discharge status: Home / Self Care | Payer: No Typology Code available for payment source | Attending: Internal Medicine | Admitting: Internal Medicine

## 2021-12-19 ENCOUNTER — Encounter: Payer: Self-pay | Admitting: Internal Medicine

## 2021-12-19 DIAGNOSIS — E782 Mixed hyperlipidemia: Secondary | ICD-10-CM

## 2021-12-19 DIAGNOSIS — I25118 Atherosclerotic heart disease of native coronary artery with other forms of angina pectoris: Secondary | ICD-10-CM | POA: Diagnosis not present

## 2021-12-19 DIAGNOSIS — R9431 Abnormal electrocardiogram [ECG] [EKG]: Secondary | ICD-10-CM | POA: Diagnosis not present

## 2021-12-19 DIAGNOSIS — R0789 Other chest pain: Secondary | ICD-10-CM | POA: Diagnosis not present

## 2021-12-19 DIAGNOSIS — I480 Paroxysmal atrial fibrillation: Secondary | ICD-10-CM | POA: Diagnosis not present

## 2021-12-19 DIAGNOSIS — G894 Chronic pain syndrome: Secondary | ICD-10-CM | POA: Diagnosis not present

## 2021-12-19 DIAGNOSIS — I10 Essential (primary) hypertension: Secondary | ICD-10-CM | POA: Diagnosis not present

## 2021-12-19 DIAGNOSIS — I259 Chronic ischemic heart disease, unspecified: Secondary | ICD-10-CM | POA: Diagnosis not present

## 2021-12-19 DIAGNOSIS — E1151 Type 2 diabetes mellitus with diabetic peripheral angiopathy without gangrene: Secondary | ICD-10-CM | POA: Diagnosis not present

## 2021-12-19 DIAGNOSIS — R079 Chest pain, unspecified: Secondary | ICD-10-CM

## 2021-12-19 DIAGNOSIS — R55 Syncope and collapse: Secondary | ICD-10-CM | POA: Diagnosis not present

## 2021-12-19 LAB — ECHOCARDIOGRAM COMPLETE
AR max vel: 3.19 cm2
AV Area VTI: 4.21 cm2
AV Area mean vel: 3.2 cm2
AV Mean grad: 1 mmHg
AV Peak grad: 2.1 mmHg
Ao pk vel: 0.73 m/s
Area-P 1/2: 5.58 cm2
Height: 72 in
S' Lateral: 2 cm
Weight: 2987.67 oz

## 2021-12-19 LAB — CBC
HCT: 44.9 % (ref 39.0–52.0)
Hemoglobin: 15.2 g/dL (ref 13.0–17.0)
MCH: 32.3 pg (ref 26.0–34.0)
MCHC: 33.9 g/dL (ref 30.0–36.0)
MCV: 95.5 fL (ref 80.0–100.0)
Platelets: 168 10*3/uL (ref 150–400)
RBC: 4.7 MIL/uL (ref 4.22–5.81)
RDW: 12.2 % (ref 11.5–15.5)
WBC: 9.9 10*3/uL (ref 4.0–10.5)
nRBC: 0 % (ref 0.0–0.2)

## 2021-12-19 LAB — NM MYOCAR MULTI W/SPECT W/WALL MOTION / EF
Estimated workload: 1
Exercise duration (min): 0 min
Exercise duration (sec): 0 s
LV dias vol: 42 mL (ref 62–150)
LV sys vol: 8 mL
MPHR: 144 {beats}/min
Nuc Stress EF: 81 %
Peak HR: 88 {beats}/min
Percent HR: 61 %
Rest HR: 75 {beats}/min
Rest Nuclear Isotope Dose: 10.3 mCi
SDS: 0
SRS: 0
SSS: 0
ST Depression (mm): 0 mm
Stress Nuclear Isotope Dose: 32.2 mCi
TID: 1.11

## 2021-12-19 LAB — BASIC METABOLIC PANEL
Anion gap: 10 (ref 5–15)
BUN: 25 mg/dL — ABNORMAL HIGH (ref 8–23)
CO2: 21 mmol/L — ABNORMAL LOW (ref 22–32)
Calcium: 9.4 mg/dL (ref 8.9–10.3)
Chloride: 103 mmol/L (ref 98–111)
Creatinine, Ser: 1.41 mg/dL — ABNORMAL HIGH (ref 0.61–1.24)
GFR, Estimated: 52 mL/min — ABNORMAL LOW (ref >60)
Glucose, Bld: 128 mg/dL — ABNORMAL HIGH (ref 70–99)
Potassium: 4.5 mmol/L (ref 3.5–5.1)
Sodium: 134 mmol/L — ABNORMAL LOW (ref 135–145)

## 2021-12-19 LAB — APTT
aPTT: 115 seconds — ABNORMAL HIGH (ref 24–36)
aPTT: 93 seconds — ABNORMAL HIGH (ref 24–36)

## 2021-12-19 LAB — GLUCOSE, CAPILLARY
Glucose-Capillary: 102 mg/dL — ABNORMAL HIGH (ref 70–99)
Glucose-Capillary: 110 mg/dL — ABNORMAL HIGH (ref 70–99)
Glucose-Capillary: 177 mg/dL — ABNORMAL HIGH (ref 70–99)
Glucose-Capillary: 199 mg/dL — ABNORMAL HIGH (ref 70–99)

## 2021-12-19 LAB — MAGNESIUM: Magnesium: 2.3 mg/dL (ref 1.7–2.4)

## 2021-12-19 MED ORDER — TECHNETIUM TC 99M TETROFOSMIN IV KIT
32.1700 | PACK | Freq: Once | INTRAVENOUS | Status: AC | PRN
  Administered 2021-12-19: 32.17 via INTRAVENOUS

## 2021-12-19 MED ORDER — TECHNETIUM TC 99M TETROFOSMIN IV KIT
10.0000 | PACK | Freq: Once | INTRAVENOUS | Status: AC | PRN
  Administered 2021-12-19: 10.29 via INTRAVENOUS

## 2021-12-19 MED ORDER — REGADENOSON 0.4 MG/5ML IV SOLN
0.4000 mg | Freq: Once | INTRAVENOUS | Status: AC
  Administered 2021-12-19: 0.4 mg via INTRAVENOUS

## 2021-12-19 MED ORDER — PERFLUTREN LIPID MICROSPHERE
1.0000 mL | INTRAVENOUS | Status: AC | PRN
  Administered 2021-12-19: 2 mL via INTRAVENOUS

## 2021-12-19 MED ORDER — ENSURE ENLIVE PO LIQD: 237.0000 mL | Freq: Three times a day (TID) | ORAL | Status: DC

## 2021-12-19 NOTE — Consult Note (Signed)
ANTICOAGULATION CONSULT NOTE   Pharmacy Consult for heparin Indication: chest pain/ACS  Allergies  Allergen Reactions   Aspergum [Aspirin] Anaphylaxis   Aspirin Swelling   Lisinopril Anaphylaxis   Angiotensin Receptor Blockers     Never taken but PMH of angioedema with ACE-I ( Lisinopril)   Duloxetine    Gabapentin    Heparin Other (See Comments)    Reaction:  Bleeding     Patient Measurements: Height: 6' (182.9 cm) Weight: 84.7 kg (186 lb 11.7 oz) IBW/kg (Calculated) : 77.6 Heparin Dosing Weight: 84.7 kg  Vital Signs: Temp: 97 F (36.1 C) (07/06 0408) BP: 108/52 (07/06 0408) Pulse Rate: 73 (07/06 0408)  Labs: Recent Labs    12/18/21 1217 12/18/21 1404 12/18/21 1948 12/19/21 0624  HGB 17.3*  --   --  15.2  HCT 51.3  --   --  44.9  PLT 174  --   --  168  APTT  --   --  35 115*  LABPROT 15.6*  --   --   --   INR 1.3*  --   --   --   HEPARINUNFRC  --   --  >1.10*  --   CREATININE 0.92  --   --  1.41*  TROPONINIHS '5 4 5  '$ --      Estimated Creatinine Clearance: 48.9 mL/min (A) (by C-G formula based on SCr of 1.41 mg/dL (H)).   Medical History: 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    Angioedema   Back injuries    Bladder cancer (HCC)    Cancer (HCC)    Carotid artery disease ([place])    > 75% bilateral ICA stenoses on 01/2013 CT   Chronic pain syndrome    Coronary artery disease    a. 2007 s/p CABG; b. 06/2020 PCI: LM 40ost, LAD nl, LCX 100p, RCA 170m- fills via L->R collats. VG->RPDA 987m (3.5x18 Resolute Onyx DES), VG->Diag 100, VG->OM2 ok.   Gastroparesis diabeticorum ([place]04/01/2021   GERD (gastroesophageal reflux disease)    Heparin allergy    Bleeding   History of tobacco use    Hx of CABG    Hyperlipidemia    Hypertension    Ischemic cardiomyopathy    a. 08/2020 Echo: EF 70-75%, no rwma, Gr1 DD, nl RV fxn, triv MR, mild-mod Ao sclerosis.   Paroxysmal atrial fibrillation (HCC)    On Xarelto anticoagulation   Type 2  diabetes mellitus ([place]   A1C 6.8% in 01/2013    Medications:  Eliquis 5 mg BID, last dose 12/18/21 at 1000  Assessment: 89ears old male with history of CAD s/p CABG, history of bladder cancer, chronic pain syndrome, GERD, HLD, HTN, a fib on Eliquis, and T2DM who presented to the ED with complaints of chest pain. Pharmacy has been consulted for heparin dosing for ACS.   Baseline labs: Hgb 15.6, plts 174, INR 1.3, aPTT 35, HL >1.10  aPTT Date/Time aPTT Rate/Comment 115 7/6/ '@0624'$  115 1100/Supratherapeutic   Goal of Therapy:  Heparin level 0.3-0.7 units/ml aPTT 66-102 seconds Monitor platelets by anticoagulation protocol: Yes   Plan: aPTT is supratherapeutic Decrease heparin infusion to 950 units/hr Check aPTT level in 8 hours and daily while on heparin Will use aPTT to monitor and adjust heparin until aPTT and HL correlate  Continue to monitor H&H and platelets   CaWynelle ClevelandPharmD 12/19/2021,7:24 AM

## 2021-12-19 NOTE — Consult Note (Signed)
ANTICOAGULATION CONSULT NOTE   Pharmacy Consult for heparin Indication: chest pain/ACS  Allergies  Allergen Reactions   Aspergum [Aspirin] Anaphylaxis   Aspirin Swelling   Lisinopril Anaphylaxis   Angiotensin Receptor Blockers     Never taken but PMH of angioedema with ACE-I ( Lisinopril)   Duloxetine    Gabapentin    Heparin Other (See Comments)    Reaction:  Bleeding     Patient Measurements: Height: 6' (182.9 cm) Weight: 84.7 kg (186 lb 11.7 oz) IBW/kg (Calculated) : 77.6 Heparin Dosing Weight: 84.7 kg  Vital Signs: Temp: 98.3 F (36.8 C) (07/06 1513) Temp Source: Oral (07/06 1513) BP: 111/68 (07/06 1513) Pulse Rate: 74 (07/06 1513)  Labs: Recent Labs    12/18/21 1217 12/18/21 1404 12/18/21 1948 12/19/21 0624 12/19/21 1546  HGB 17.3*  --   --  15.2  --   HCT 51.3  --   --  44.9  --   PLT 174  --   --  168  --   APTT  --   --  35 115* 93*  LABPROT 15.6*  --   --   --   --   INR 1.3*  --   --   --   --   HEPARINUNFRC  --   --  >1.10*  --   --   CREATININE 0.92  --   --  1.41*  --   TROPONINIHS '5 4 5  '$ --   --      Estimated Creatinine Clearance: 48.9 mL/min (A) (by C-G formula based on SCr of 1.41 mg/dL (H)).   Medical History: 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    Angioedema   Back injuries    Bladder cancer (HCC)    Cancer (HCC)    Carotid artery disease ([place])    > 75% bilateral ICA stenoses on 01/2013 CT   Chronic pain syndrome    Coronary artery disease    a. 2007 s/p CABG; b. 06/2020 PCI: LM 40ost, LAD nl, LCX 100p, RCA 183m- fills via L->R collats. VG->RPDA 972m (3.5x18 Resolute Onyx DES), VG->Diag 100, VG->OM2 ok.   Gastroparesis diabeticorum ([facility]we[place]04/01/2021   GERD (gastroesophageal reflux disease)    Heparin allergy    Bleeding   History of tobacco use    Hx of CABG    Hyperlipidemia    Hypertension    Ischemic cardiomyopathy    a. 08/2020 Echo: EF 70-75%, no rwma, Gr1 DD, nl RV fxn, triv MR, mild-mod  Ao sclerosis.   Paroxysmal atrial fibrillation (HCC)    On Xarelto anticoagulation   Type 2 diabetes mellitus ([place]   A1C 6.8% in 01/2013    Medications:  Eliquis 5 mg BID, last dose 12/18/21 at 1000  Assessment: [age]ears old male with history of CAD s/p CABG, history of bladder cancer, chronic pain syndrome, GERD, HLD, HTN, a fib on Eliquis, and T2DM who presented to the ED with complaints of chest pain. Pharmacy has been consulted for heparin dosing for ACS.   Baseline labs: Hgb 15.6, plts 174, INR 1.3, aPTT 35, HL >1.10  Date/Time aPTT Rate/Comment 7/6'@0624'$  115 1100/Supratherapeutic 7/6'@1546'$  93 950/therapeutic x 1   Goal of Therapy:  Heparin level 0.3-0.7 units/ml aPTT 66-102 seconds Monitor platelets by anticoagulation protocol: Yes   Plan: aPTT is therapeutic x1 Continue heparin infusion to 950 units/hr Check confirmatory aPTT level in 8 hours and daily while on heparin Will use aPTT to  monitor and adjust heparin until aPTT and HL correlate  Continue to monitor H&H and platelets   Pearla Dubonnet, PharmD 12/19/2021,4:26 PM

## 2021-12-19 NOTE — Progress Notes (Signed)
*  PRELIMINARY RESULTS* Echocardiogram 2D Echocardiogram has been performed.  Sherrie Sport 12/19/2021, 10:15 AM

## 2021-12-19 NOTE — Consult Note (Addendum)
[facility] Consultation:   Patient ID: Bryan Scott MRN: [id number]; DOB: 05/10/1946  Admit date: 12/18/2021 Date of Consult: 12/19/2021  PCP:  Bryan Pitch, MD   [facility] Providers Cardiologist:  Bryan Bush, MD  Physician requesting consult Dr. Louanne Scott Reason for consult: Syncope, angina  Patient Profile:   Bryan BOEHLE is a 76 y.o. male with a hx of coronary artery disease, CABG, stenting vein graft to the PDA 2022, chronic pain on chronic narcotics, lives in skilled nursing facility presents with syncope and chest pain  History of Present Illness:   Mr. Knee reports that he was in his usual state of health, received his morning medications yesterday while at [facility].  Was in his wheelchair rolling down the hallway when he developed dizziness.  Was able to make it back to his bed, transferred to the side of the bed, lightheadedness persisted.  Reports that he had syncope, woke up 5 or 10 minutes later on his bed, called for help.  Since then has had some chest discomfort that he has required morphine every 3 hours while in the hospital.  Cardiac enzymes negative Cardiac EKG nonacute Currently resting comfortably but reports having continued chest pain  Prior cardiac history reviewed Echocardiogram March 2022 ejection fraction 22% grade 1 diastolic dysfunction, normal RV size and function, mild to moderate aortic valve sclerosis without significant stenosis, no other significant valvular heart disease, normal right heart pressures  Catheterization January 2022 Multivessel coronary artery and bypass graft disease,  Culprit lesion appears to be 95% stenosis in SVG-RCA. Successful PCI to SVG-RCA using Resolute Onyx 3.5 x 18 mm drug-eluting stent with 0% residual stenosis and TIMI-3 flow.   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    Angioedema   Back injuries    Bladder cancer (HCC)    Cancer (HCC)    Carotid artery disease ([place])    >  75% bilateral ICA stenoses on 01/2013 CT   Chronic pain syndrome    Coronary artery disease    a. 2007 s/p CABG; b. 06/2020 PCI: LM 40ost, LAD nl, LCX 100p, RCA 132m- fills via L->R collats. VG->RPDA 965m (3.5x18 Resolute Onyx DES), VG->Diag 100, VG->OM2 ok.   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 08/2020 Echo: EF 70-75%, no rwma, Gr1 DD, nl RV fxn, triv MR, mild-mod Ao sclerosis.   Paroxysmal atrial fibrillation (HCC)    On Xarelto anticoagulation   Type 2 diabetes mellitus ([facility]   A1C 6.8% in 01/2013    Past Surgical History:  Procedure Laterality Date   CARDIAC CATHETERIZATION     CORONARY ARTERY BYPASS GRAFT  2007   CORONARY STENT INTERVENTION N/A 07/13/2020   Procedure: CORONARY STENT INTERVENTION;  Surgeon: EnNelva BushMD;  [facility];  Service: Cardiovascular;  Laterality: N/A;   ESOPHAGOGASTRODUODENOSCOPY (EGD) WITH PROPOFOL N/A 09/20/2020   Procedure: ESOPHAGOGASTRODUODENOSCOPY (EGD) WITH PROPOFOL;  Surgeon: StLadene ArtistMD;  [facility];  Service: Endoscopy;  Laterality: N/A;   LEFT HEART CATH AND CORS/GRAFTS ANGIOGRAPHY N/A 07/13/2020   Procedure: LEFT HEART CATH AND CORS/GRAFTS ANGIOGRAPHY;  Surgeon: GoMinna MerrittsMD;  [facility];  Service: Cardiovascular;  Laterality: N/A;  Home Medications:  Prior to Admission medications   Medication Sig Start Date End Date Taking? Authorizing Provider  apixaban (ELIQUIS) 5 MG TABS tablet Take 5 mg by mouth 2 (two) times daily. 01/06/21 01/07/22 Yes [provider]  atorvastatin (LIPITOR) 80 MG tablet Take 1 tablet (80 mg total) by mouth daily. 10/11/20  Yes Medina-Vargas, Monina C, NP  empagliflozin (JARDIANCE) 25 MG TABS tablet Take 1 tablet (25 mg total) by mouth daily. 10/11/20   Yes Medina-Vargas, Monina C, NP  ezetimibe (ZETIA) 10 MG tablet Take 1 tablet (10 mg total) by mouth daily. 10/11/20  Yes Medina-Vargas, Monina C, NP  oxyCODONE (ROXICODONE) 5 MG immediate release tablet Take 5 mg by mouth every 4 (four) hours as needed for severe pain.   Yes [provider]  tamsulosin (FLOMAX) 0.4 MG CAPS capsule Take 0.4 mg by mouth.   Yes [provider]  acetaminophen (TYLENOL) 325 MG tablet Take 2 tablets (650 mg total) by mouth every 6 (six) hours. Patient not taking: Reported on 12/18/2021 09/25/20   Bryan Del, DO  acetaminophen (TYLENOL) 325 MG tablet Take 2 tablets (650 mg total) by mouth every 4 (four) hours as needed for mild pain (or temp > 37.5 C (99.5 F)). 08/02/21   Bryan Grayer, MD  carvedilol (COREG) 6.25 MG tablet Take 1 tablet (6.25 mg total) by mouth 2 (two) times daily with a meal. Patient not taking: Reported on 12/18/2021 10/11/20   Medina-Vargas, Monina C, NP  clopidogrel (PLAVIX) 75 MG tablet Take 1 tablet (75 mg total) by mouth every morning. Patient not taking: Reported on 12/18/2021 10/11/20   Medina-Vargas, Monina C, NP  diphenhydrAMINE (BENADRYL) 25 mg capsule Take 1 capsule (25 mg total) by mouth every 6 (six) hours as needed for itching or allergies (throat tightning feeling). Patient not taking: Reported on 12/18/2021 08/02/21   Bryan Grayer, MD  DULoxetine (CYMBALTA) 30 MG capsule Take 1 capsule (30 mg total) by mouth daily. Patient not taking: Reported on 12/18/2021 10/11/20   Medina-Vargas, Monina C, NP  gabapentin (NEURONTIN) 100 MG capsule Take 1 capsule (100 mg total) by mouth 3 (three) times daily. Patient not taking: Reported on 12/18/2021 08/02/21   Bryan Grayer, MD  isosorbide mononitrate (IMDUR) 60 MG 24 hr tablet Take 1 tablet (60 mg total) by mouth daily. Patient not taking: Reported on 12/18/2021 08/02/21   Bryan Grayer, MD  melatonin 5 MG TABS Take 1 tablet (5 mg total) by mouth at bedtime. Patient not taking:  Reported on 12/18/2021 10/11/20   Medina-Vargas, Monina C, NP  metFORMIN (GLUCOPHAGE) 500 MG tablet Take 1 tablet (500 mg total) by mouth daily with breakfast. Patient not taking: Reported on 12/18/2021 08/02/21   Bryan Grayer, MD  Multiple Vitamin (MULTIVITAMIN WITH MINERALS) TABS tablet Take 1 tablet by mouth daily. 08/10/20   Fritzi Mandes, MD  nitroGLYCERIN (NITROSTAT) 0.4 MG SL tablet Place 1 tablet (0.4 mg total) under the tongue every 5 (five) minutes as needed for chest pain. 10/11/20   Medina-Vargas, Monina C, NP  Nutritional Supplements (FEEDING SUPPLEMENT, GLUCERNA 1.2 CAL,) LIQD Take 237 mLs by mouth daily in the afternoon.    [provider]  ondansetron (ZOFRAN) 4 MG tablet Take 1 tablet (4 mg total) by mouth daily as needed for nausea or vomiting. 02/19/21 02/19/22  Shelly Coss, MD  pantoprazole (PROTONIX) 40 MG tablet Take 1 tablet (40 mg total) by mouth daily. Patient not taking: Reported on 12/18/2021 10/11/20   Medina-Vargas, Senaida Lange, NP  polyethylene glycol (MIRALAX / GLYCOLAX) 17 g packet Take 17 g by mouth daily. 09/25/20   Bryan Del, DO  ranolazine (RANEXA) 500 MG 12 hr tablet Take 1 tablet (500 mg total) by mouth 2 (two) times daily. Patient not taking: Reported on 12/18/2021 02/19/21   Shelly Coss, MD  senna-docusate (SENOKOT-S) 8.6-50 MG tablet Take 2 tablets by mouth 2 (two) times daily. Patient not taking: Reported on 12/18/2021 09/06/20   Sharen Hones, MD  sucralfate (CARAFATE) 1 g tablet Take 1 tablet (1 g total) by mouth 4 (four) times daily -  with meals and at bedtime. Patient not taking: Reported on 12/18/2021 10/11/20   Medina-Vargas, Monina C, NP  Tetrahydrozoline HCl (VISINE OP) Place 1 drop into both eyes daily as needed (dry red eyes).    [provider]    Inpatient Medications: Scheduled Meds:  atorvastatin  80 mg Oral Daily   carvedilol  6.25 mg Oral BID WC   clopidogrel  75 mg Oral q morning   ezetimibe  10 mg Oral Daily   feeding supplement  (GLUCERNA 1.2 CAL)  237 mL Oral Q1500   insulin aspart  0-5 Units Subcutaneous QHS   insulin aspart  0-9 Units Subcutaneous TID WC   isosorbide mononitrate  60 mg Oral Daily   melatonin  5 mg Oral QHS   multivitamin with minerals  1 tablet Oral Daily   pantoprazole  40 mg Oral Daily   polyethylene glycol  17 g Oral Daily   ranolazine  500 mg Oral BID   senna-docusate  2 tablet Oral BID   sucralfate  1 g Oral TID WC & HS   tamsulosin  0.4 mg Oral QPC supper   Continuous Infusions:  heparin 950 Units/hr (12/19/21 0845)   PRN Meds: acetaminophen, morphine injection, nitroGLYCERIN, ondansetron, oxyCODONE, tetrahydrozoline  Allergies:    Allergies  Allergen Reactions   Aspergum [Aspirin] Anaphylaxis   Aspirin Swelling   Lisinopril Anaphylaxis   Angiotensin Receptor Blockers     Never taken but PMH of angioedema with ACE-I ( Lisinopril)   Duloxetine    Gabapentin    Heparin Other (See Comments)    Reaction:  Bleeding     Social History:   Social History   Socioeconomic History   Marital status: Single    Spouse name: Not on file   Number of children: Not on file   Years of education: Not on file   Highest education level: Not on file  Occupational History   Occupation: retired  Tobacco Use   Smoking status: Former    Packs/day: 1.00    Years: 30.00    Total pack years: 30.00    Types: Cigarettes   Smokeless tobacco: Never   Tobacco comments:    30 pack-year history. Quit 8-9 years ago.   Vaping Use   Vaping Use: Never used  Substance and Sexual Activity   Alcohol use: Not Currently   Drug use: Never   Sexual activity: Not on file  Other Topics Concern   Not on file  Social History Narrative   ** Merged History Encounter **       Social Determinants of Health   Financial Resource Strain: Not on file  Food Insecurity: Not on file  Transportation Needs: Not on file  Physical Activity: Not on file  Stress: Not on file  Social Connections: Not on file   Intimate Partner Violence: Not on file    Family History:    Family History  Problem Relation Age of Onset   Heart disease Mother 32       Deceased   CVA Mother    Seizures Father 17       Deceased from complications with seizures     ROS:  Please see the history of present illness.  Review of Systems  Constitutional: Negative.   HENT: Negative.    Respiratory: Negative.    Cardiovascular:  Positive for chest pain.  Gastrointestinal: Negative.   Musculoskeletal: Negative.   Neurological:  Positive for dizziness and loss of consciousness.  Psychiatric/Behavioral: Negative.    All other systems reviewed and are negative.    Physical Exam/Data:   Vitals:   12/18/21 2309 12/19/21 0029 12/19/21 0408 12/19/21 0737  BP: (!) 85/63 106/64 (!) 108/52 100/62  Pulse: 79 80 73 69  Resp: '18 20 20 18  '$ Temp: (!) 97.2 F (36.2 C)  (!) 97 F (36.1 C) 98.2 F (36.8 C)  TempSrc:    Oral  SpO2: 97% 97% 98% 95%  Weight:      Height:        Intake/Output Summary (Last 24 hours) at 12/19/2021 0903 Last data filed at 12/19/2021 0736 Gross per 24 hour  Intake --  Output 325 ml  Net -325 ml      12/18/2021   12:05 PM 07/24/2021    8:47 PM 04/10/2021    9:35 PM  Last 3 Weights  Weight (lbs) 186 lb 11.7 oz 186 lb 11.7 oz 158 lb  Weight (kg) 84.7 kg 84.7 kg 71.668 kg     Body mass index is 25.33 kg/m.  General:  Well nourished, well developed, in no acute distress HEENT: normal Neck: no JVD Vascular: No carotid bruits; Distal pulses 2+ bilaterally Cardiac:  normal S1, S2; RRR; no murmur  Lungs:  clear to auscultation bilaterally, no wheezing, rhonchi or rales  Abd: soft, nontender, no hepatomegaly  Ext: no edema Musculoskeletal:  No deformities, BUE and BLE strength normal and equal Skin: warm and dry  Neuro:  CNs 2-12 intact, no focal abnormalities noted Psych:  Normal affect   EKG:  The EKG was personally reviewed and demonstrates:   normal sinus rhythm no sigficant ST or T  wave changes Telemetry:  Telemetry was personally reviewed and demonstrates:   NSR rate 70   Relevant CV Studies: Echocardiogram pending  Laboratory Data:  High Sensitivity Troponin:   Recent Labs  Lab 12/18/21 1217 12/18/21 1404 12/18/21 1948  TROPONINIHS '5 4 5     '$ Chemistry Recent Labs  Lab 12/18/21 1217 12/19/21 0624  NA 134* 134*  K 4.6 4.5  CL 100 103  CO2 24 21*  GLUCOSE 132* 128*  BUN 18 25*  CREATININE 0.92 1.41*  CALCIUM 9.6 9.4  MG  --  2.3  GFRNONAA >60 52*  ANIONGAP 10 10    Recent Labs  Lab 12/18/21 1217  PROT 7.7  ALBUMIN 4.2  AST 30  ALT 24  ALKPHOS 75  BILITOT 3.1*   Lipids No results for input(s): "CHOL", "TRIG", "HDL", "LABVLDL", "LDLCALC", "CHOLHDL" in the last 168 hours.  Hematology Recent Labs  Lab 12/18/21 1217 12/19/21 0624  WBC 9.3 9.9  RBC 5.32 4.70  HGB 17.3* 15.2  HCT 51.3 44.9  MCV 96.4 95.5  MCH 32.5 32.3  MCHC 33.7 33.9  RDW 12.2 12.2  PLT 174 168   Thyroid No results for input(s): "TSH", "FREET4" in the last 168 hours.  BNPNo results for input(s): "BNP", "PROBNP" in  the last 168 hours.  DDimer No results for input(s): "DDIMER" in the last 168 hours.   Radiology/Studies:  CT Angio Chest/Abd/Pel for Dissection W and/or Wo Contrast  Result Date: 12/18/2021 CLINICAL DATA:  A 76 year old male presents for evaluation of suspected aortic dissection with sudden onset of back and chest pain. EXAM: CT ANGIOGRAPHY CHEST, ABDOMEN AND PELVIS TECHNIQUE: Non-contrast CT of the chest was initially obtained. Multidetector CT imaging through the chest, abdomen and pelvis was performed using the standard protocol during bolus administration of intravenous contrast. Multiplanar reconstructed images and MIPs were obtained and reviewed to evaluate the vascular anatomy. RADIATION DOSE REDUCTION: This exam was performed according to the departmental dose-optimization program which includes automated exposure control, adjustment of the mA  and/or kV according to patient size and/or use of iterative reconstruction technique. CONTRAST:  143m OMNIPAQUE IOHEXOL 350 MG/ML SOLN COMPARISON:  July 24, 2021. FINDINGS: CTA CHEST FINDINGS Cardiovascular: Normal heart size. No pericardial effusion or pericardial thickening. Post median sternotomy for coronary revascularization as before. Normal caliber of the thoracic aorta. Aortic atherosclerosis both calcified and noncalcified with similar appearance. No aneurysmal dilation. No dissection or signs of vasculitis. Dedicated origin of LEFT vertebral either from the LEFT subclavian at the origin of LEFT subclavian. Precontrast imaging without signs of intramural hematoma. Study is negative for central or lobar level pulmonary embolism. Mediastinum/Nodes: No thoracic inlet, axillary, mediastinal or hilar adenopathy. Esophagus grossly normal. Lungs/Pleura: No effusion. No consolidative changes. Airways are patent. No pneumothorax. Nodular area in the RIGHT upper lobe inferiorly along the pleural surface 8 x 3 mm (image 84/6) biapical pleural and parenchymal scarring. Musculoskeletal: See below for full musculoskeletal details. Review of the MIP images confirms the above findings. CTA ABDOMEN AND PELVIS FINDINGS VASCULAR Aorta: Aortic atherosclerosis both calcified and noncalcified without signs of aneurysmal dilation. Celiac: Celiac with atherosclerotic changes at the origin of the celiac artery, no substantial narrowing. Dedicated origin of hepatic artery. SMA: Soft plaque with segmental narrowing of the SMA showing moderate narrowing at the level of jejunal branches that is similar to prior imaging. Vessel is patent without signs of dissection or vasculitis and without change from imaging in February. Renals: Single of renal arteries bilaterally which are widely patent. IMA: Patent without evidence of aneurysm, dissection, vasculitis or significant stenosis. Inflow: Patent without evidence of aneurysm,  dissection, vasculitis or significant stenosis. Veins: No obvious venous abnormality within the limitations of this arterial phase study. Outflow into the upper thigh is preserved with atherosclerotic changes in the iliac vasculature and moderate to marked narrowing of the RIGHT common iliac (image 168/5 due to mixture of calcified and noncalcified plaque, this is at the terminal aspect of the stented vessel just proximal to the iliac bifurcation. Review of the MIP images confirms the above findings. NON-VASCULAR Hepatobiliary: Smooth hepatic contours. No pericholecystic stranding. No change in mild prominence of the common bile duct between 5 and 6 mm. No gross intrahepatic biliary duct distension. Pancreas: Normal, without mass, inflammation or ductal dilatation. Spleen: Normal. Adrenals/Urinary Tract: Adrenal glands are normal. Nephrolithiasis on the RIGHT is unchanged grossly 12 mm calculus in the interpolar RIGHT kidney. No suspicious renal lesion, hydronephrosis or ureteral calculi. Stomach/Bowel: Normal appendix. No acute gastrointestinal process.  Stool fills much of the colon. 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 Findings are similar to previous imaging. No adenopathy in the abdomen or in the pelvis. Reproductive: Moderate prostatomegaly. Other: No ascites. Musculoskeletal: No acute bone  finding. No destructive bone process. Spinal degenerative changes. Moderate narrowing of the central canal at L5-S1 similar to previous imaging. Signs of prior trauma to the LEFT hemipelvis, iliac bone similar to previous imaging. Evidence of prior spinal fusion at L3-4 and L4-5. Review of the MIP images confirms the above findings. IMPRESSION: 1. No evidence of thoracic or abdominal aortic aneurysm or dissection. Marked vascular disease and signs of coronary revascularization and prior vascular stenting as before. 2. Atherosclerotic Narrowing of the SMA  with soft plaque is unchanged and at least moderate at the level of jejunal branches. 3. No signs of central or lobar level pulmonary embolism. 4. Degenerative changes of the spine with evidence of prior spinal fusion moderate central canal narrowing at L5-S1. 5. Moderate to marked narrowing of the RIGHT common iliac due to mixture of calcified and noncalcified plaque. 6. 6 mm right solid pulmonary nodule within the upper lobe. Recommend a non-contrast Chest CT at 6-12 months. If patient is high risk for malignancy, recommend an additional non-contrast Chest CT at 18-24 months; if patient is low risk for malignancy a non-contrast Chest CT at 18-24 months is optional. these guidelines do not apply to immunocompromised patients and patients with cancer. Follow up in patients with significant comorbidities as clinically warranted. For lung cancer screening, adhere to Lung-RADS guidelines. Reference: Radiology. 2017; 284(1):228-43. 7. Nonobstructive RIGHT renal calculus.  This measures 12 mm. 8. Moderate prostatomegaly. Aortic Atherosclerosis (ICD10-I70.0). Electronically Signed   By: Zetta Bills M.D.   On: 12/18/2021 13:39   DG Chest Port 1 View  Result Date: 12/18/2021 CLINICAL DATA:  Chest pain with nausea and dizziness EXAM: PORTABLE CHEST 1 VIEW COMPARISON:  03/21/2021 FINDINGS: Lower cervical plate and screw fixator. Prior CABG. Heart size within normal limits. Left proximal humeral plate and screw fixator. No blunting of the costophrenic angles. Minimal scarring at the left lung base. No acute thoracic findings. IMPRESSION: 1. No acute/active thoracic findings. 2. Prior CABG. 3. Minimal scarring at the left lung base. Electronically Signed   By: Van Clines M.D.   On: 12/18/2021 12:42     Assessment and Plan:   Syncope/chest pain Presenting from [facility] with syncope and chest pain yesterday morning after he received his medications Reports continued chest pain this morning, unable to  exclude underlying ischemia given significant coronary disease, history of CABG and stenting Troponin negative, no significant change in EKG --- Low blood pressure in the setting of receiving isosorbide last night and again this morning, also morphine every 3 hours --Plan is for Myoview this morning to rule out high risk ischemia --Close monitoring of blood pressure through the day, consider decrease of morphine if possible --If blood pressure continues to run low we may need to decrease dose of isosorbide or carvedilol --Consider Zio monitor at discharge  Cad with stable angina Known coronary artery disease, history of CABG, stent placement January 2022 Had stent placed to vein graft to the RCA On Eliquis in place of aspirin, Lipitor 80 daily, carvedilol 6.25 twice daily, Plavix, isosorbide 60 daily  Chronic pain Reports having chronic chest neck shoulder pain On chronic narcotics Unable to exclude chest pain exacerbation from syncope and musculoskeletal etiology  Diabetes type 2 with complications On metformin, Jardiance  Hyperlipidemia On Lipitor, goal LDL less than 70  History of paroxysmal atrial fibrillation Maintaining normal sinus rhythm, on Eliquis, carvedilol   Total encounter time more than 80 minutes  Greater than 50% was spent in counseling and coordination of  care with the patient   For questions or updates, please contact [place] Please consult [email or web address] for contact info under    Signed, Ida Rogue, MD  12/19/2021 9:03 AM

## 2021-12-19 NOTE — Progress Notes (Signed)
PROGRESS NOTE    Bryan Scott  BUL:[id number] DOB: 30-Apr-1946 DOA: 12/18/2021 PCP: [name], MD    Brief Narrative:  76 year old with history of coronary artery disease status post CABG, history of bladder cancer, chronic pain syndrome, chronic back pain on long-term opiates, GERD, hypertension hyperlipidemia, paroxysmal A-fib, type 2 diabetes who is currently a long-term nursing home resident presented to the ER with midsternal chest pain.  He also has back pain.  Patient also had 2 episodes of passing out spells that were preceded with mild diaphoresis and dizziness. In the emergency room blood pressure elevated.  EKG without acute findings but nonspecific ST changes.  Chest x-ray normal.  CTA of the chest with no evidence of dissection or acute findings.  Nitroglycerin improved the pain.  Admitted to rule out acute coronary syndrome.  Followed by cardiology.   Assessment & Plan:   Chest pain: Likely musculoskeletal with history of chronic pain syndrome.  High risk of coronary artery disease.  Treated as unstable angina. Agree with monitoring in the hospital given severity of symptoms and extensive cardiovascular morbidities. Currently chest pain improved.  Discontinue high dose of morphine.  Will use oxycodone as his home dose. EKG and troponins nonischemic so far. Supplemental oxygen to keep saturations more than 90%. Remains on home dose of Plavix. Remains on Imdur and Ranexa. Morphine low-dose if any recurrent pain. Therapeutic anticoagulation with heparin infusion ongoing. Echocardiogram being ordered.  Patient is scheduled to have [place] today.  Followed by cardiology.  Syncope: Vagal, orthostatic or related to pain.  Echocardiogram.  Telemetry without events.  May need event monitoring by cardiology.  Paroxysmal A-fib: Currently sinus rhythm.  On Eliquis that is changed to heparin now.  Rate controlled on Coreg.  Essential hypertension: Continue Coreg.  Continue Imdur.   Had episode of low blood pressures after dose of morphine and nitrates.  Discontinue morphine.  Acute kidney injury: Probably secondary to transient hypotension and contrast studies overnight.  Avoid further drop in blood pressures.  We will recheck tomorrow.    DVT prophylaxis:   Heparin infusion   Code Status: Full code Family Communication: None Disposition Plan: Status is: Observation The patient will require care spanning > 2 midnights and should be moved to inpatient because: Significant chest pain needing inpatient procedures, stress test.     Consultants:  Cardiology  Procedures:  Stress test  Antimicrobials:  None   Subjective: Patient seen and examined.  He is more focused on his back pain and how he may have to have surgery with neurosurgeon.  We discussed about his bedbound status, rather manage his pain with pain medications.  Not likely a surgical candidate at this moment. He does have intermittent dull midsternal pain.  Patient is agreeable to only use his long-term oral oxycodone and stop using injectable morphine.  Objective: Vitals:   12/18/21 2309 12/19/21 0029 12/19/21 0408 12/19/21 0737  BP: (!) 85/63 106/64 (!) 108/52 100/62  Pulse: 79 80 73 69  Resp: '18 20 20 18  '$ Temp: (!) 97.2 F (36.2 C)  (!) 97 F (36.1 C) 98.2 F (36.8 C)  TempSrc:    Oral  SpO2: 97% 97% 98% 95%  Weight:      Height:        Intake/Output Summary (Last 24 hours) at 12/19/2021 1057 Last data filed at 12/19/2021 0736 Gross per 24 hour  Intake --  Output 325 ml  Net -325 ml   Filed Weights   12/18/21 1205  Weight:  84.7 kg    Examination:  General exam: Appears calm and comfortable  Chronically sick looking but not in any distress.  Pleasant and conversant. Respiratory system: No added sounds. Cardiovascular system: S1 & S2 heard, RRR.  No reproducible chest pain. No pedal edema. Gastrointestinal system: Soft.  Nontender. Central nervous system: Alert and oriented. No  focal neurological deficits. Extremities: Generalized weakness.  No focal deficits.    Data Reviewed: I have personally reviewed following labs and imaging studies  CBC: Recent Labs  Lab 12/18/21 1217 12/19/21 0624  WBC 9.3 9.9  NEUTROABS 6.5  --   HGB 17.3* 15.2  HCT 51.3 44.9  MCV 96.4 95.5  PLT 174 161   Basic Metabolic Panel: Recent Labs  Lab 12/18/21 1217 12/19/21 0624  NA 134* 134*  K 4.6 4.5  CL 100 103  CO2 24 21*  GLUCOSE 132* 128*  BUN 18 25*  CREATININE 0.92 1.41*  CALCIUM 9.6 9.4  MG  --  2.3   GFR: Estimated Creatinine Clearance: 48.9 mL/min (A) (by C-G formula based on SCr of 1.41 mg/dL (H)). Liver Function Tests: Recent Labs  Lab 12/18/21 1217  AST 30  ALT 24  ALKPHOS 75  BILITOT 3.1*  PROT 7.7  ALBUMIN 4.2   No results for input(s): "LIPASE", "AMYLASE" in the last 168 hours. No results for input(s): "AMMONIA" in the last 168 hours. Coagulation Profile: Recent Labs  Lab 12/18/21 1217  INR 1.3*   Cardiac Enzymes: No results for input(s): "CKTOTAL", "CKMB", "CKMBINDEX", "TROPONINI" in the last 168 hours. BNP (last 3 results) No results for input(s): "PROBNP" in the last 8760 hours. HbA1C: No results for input(s): "HGBA1C" in the last 72 hours. CBG: Recent Labs  Lab 12/18/21 2116 12/19/21 0754  GLUCAP 128* 102*   Lipid Profile: No results for input(s): "CHOL", "HDL", "LDLCALC", "TRIG", "CHOLHDL", "LDLDIRECT" in the last 72 hours. Thyroid Function Tests: No results for input(s): "TSH", "T4TOTAL", "FREET4", "T3FREE", "THYROIDAB" in the last 72 hours. Anemia Panel: No results for input(s): "VITAMINB12", "FOLATE", "FERRITIN", "TIBC", "IRON", "RETICCTPCT" in the last 72 hours. Sepsis Labs: No results for input(s): "PROCALCITON", "LATICACIDVEN" in the last 168 hours.  No results found for this or any previous visit (from the past 240 hour(s)).       Radiology Studies: CT Angio Chest/Abd/Pel for Dissection W and/or Wo  Contrast  Result Date: 12/18/2021 CLINICAL DATA:  A 76 year old male presents for evaluation of suspected aortic dissection with sudden onset of back and chest pain. EXAM: CT ANGIOGRAPHY CHEST, ABDOMEN AND PELVIS TECHNIQUE: Non-contrast CT of the chest was initially obtained. Multidetector CT imaging through the chest, abdomen and pelvis was performed using the standard protocol during bolus administration of intravenous contrast. Multiplanar reconstructed images and MIPs were obtained and reviewed to evaluate the vascular anatomy. RADIATION DOSE REDUCTION: This exam was performed according to the departmental dose-optimization program which includes automated exposure control, adjustment of the mA and/or kV according to patient size and/or use of iterative reconstruction technique. CONTRAST:  127m OMNIPAQUE IOHEXOL 350 MG/ML SOLN COMPARISON:  July 24, 2021. FINDINGS: CTA CHEST FINDINGS Cardiovascular: Normal heart size. No pericardial effusion or pericardial thickening. Post median sternotomy for coronary revascularization as before. Normal caliber of the thoracic aorta. Aortic atherosclerosis both calcified and noncalcified with similar appearance. No aneurysmal dilation. No dissection or signs of vasculitis. Dedicated origin of LEFT vertebral either from the LEFT subclavian at the origin of LEFT subclavian. Precontrast imaging without signs of intramural hematoma. Study is negative for  central or lobar level pulmonary embolism. Mediastinum/Nodes: No thoracic inlet, axillary, mediastinal or hilar adenopathy. Esophagus grossly normal. Lungs/Pleura: No effusion. No consolidative changes. Airways are patent. No pneumothorax. Nodular area in the RIGHT upper lobe inferiorly along the pleural surface 8 x 3 mm (image 84/6) biapical pleural and parenchymal scarring. Musculoskeletal: See below for full musculoskeletal details. Review of the MIP images confirms the above findings. CTA ABDOMEN AND PELVIS FINDINGS  VASCULAR Aorta: Aortic atherosclerosis both calcified and noncalcified without signs of aneurysmal dilation. Celiac: Celiac with atherosclerotic changes at the origin of the celiac artery, no substantial narrowing. Dedicated origin of hepatic artery. SMA: Soft plaque with segmental narrowing of the SMA showing moderate narrowing at the level of jejunal branches that is similar to prior imaging. Vessel is patent without signs of dissection or vasculitis and without change from imaging in February. Renals: Single of renal arteries bilaterally which are widely patent. IMA: Patent without evidence of aneurysm, dissection, vasculitis or significant stenosis. Inflow: Patent without evidence of aneurysm, dissection, vasculitis or significant stenosis. Veins: No obvious venous abnormality within the limitations of this arterial phase study. Outflow into the upper thigh is preserved with atherosclerotic changes in the iliac vasculature and moderate to marked narrowing of the RIGHT common iliac (image 168/5 due to mixture of calcified and noncalcified plaque, this is at the terminal aspect of the stented vessel just proximal to the iliac bifurcation. Review of the MIP images confirms the above findings. NON-VASCULAR Hepatobiliary: Smooth hepatic contours. No pericholecystic stranding. No change in mild prominence of the common bile duct between 5 and 6 mm. No gross intrahepatic biliary duct distension. Pancreas: Normal, without mass, inflammation or ductal dilatation. Spleen: Normal. Adrenals/Urinary Tract: Adrenal glands are normal. Nephrolithiasis on the RIGHT is unchanged grossly 12 mm calculus in the interpolar RIGHT kidney. No suspicious renal lesion, hydronephrosis or ureteral calculi. Stomach/Bowel: Normal appendix. No acute gastrointestinal process.  Stool fills much of the colon. 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 Findings are similar to previous  imaging. No adenopathy in the abdomen or in the pelvis. Reproductive: Moderate prostatomegaly. Other: No ascites. Musculoskeletal: No acute bone finding. No destructive bone process. Spinal degenerative changes. Moderate narrowing of the central canal at L5-S1 similar to previous imaging. Signs of prior trauma to the LEFT hemipelvis, iliac bone similar to previous imaging. Evidence of prior spinal fusion at L3-4 and L4-5. Review of the MIP images confirms the above findings. IMPRESSION: 1. No evidence of thoracic or abdominal aortic aneurysm or dissection. Marked vascular disease and signs of coronary revascularization and prior vascular stenting as before. 2. Atherosclerotic Narrowing of the SMA with soft plaque is unchanged and at least moderate at the level of jejunal branches. 3. No signs of central or lobar level pulmonary embolism. 4. Degenerative changes of the spine with evidence of prior spinal fusion moderate central canal narrowing at L5-S1. 5. Moderate to marked narrowing of the RIGHT common iliac due to mixture of calcified and noncalcified plaque. 6. 6 mm right solid pulmonary nodule within the upper lobe. Recommend a non-contrast Chest CT at 6-12 months. If patient is high risk for malignancy, recommend an additional non-contrast Chest CT at 18-24 months; if patient is low risk for malignancy a non-contrast Chest CT at 18-24 months is optional. these guidelines do not apply to immunocompromised patients and patients with cancer. Follow up in patients with significant comorbidities as clinically warranted. For lung cancer screening, adhere to Lung-RADS  guidelines. Reference: Radiology. 2017; 284(1):228-43. 7. Nonobstructive RIGHT renal calculus.  This measures 12 mm. 8. Moderate prostatomegaly. Aortic Atherosclerosis (ICD10-I70.0). Electronically Signed   By: Zetta Bills M.D.   On: 12/18/2021 13:39   DG Chest Port 1 View  Result Date: 12/18/2021 CLINICAL DATA:  Chest pain with nausea and  dizziness EXAM: PORTABLE CHEST 1 VIEW COMPARISON:  03/21/2021 FINDINGS: Lower cervical plate and screw fixator. Prior CABG. Heart size within normal limits. Left proximal humeral plate and screw fixator. No blunting of the costophrenic angles. Minimal scarring at the left lung base. No acute thoracic findings. IMPRESSION: 1. No acute/active thoracic findings. 2. Prior CABG. 3. Minimal scarring at the left lung base. Electronically Signed   By: Van Clines M.D.   On: 12/18/2021 12:42        Scheduled Meds:  atorvastatin  80 mg Oral Daily   carvedilol  6.25 mg Oral BID WC   clopidogrel  75 mg Oral q morning   ezetimibe  10 mg Oral Daily   feeding supplement (GLUCERNA 1.2 CAL)  237 mL Oral Q1500   insulin aspart  0-5 Units Subcutaneous QHS   insulin aspart  0-9 Units Subcutaneous TID WC   isosorbide mononitrate  60 mg Oral Daily   melatonin  5 mg Oral QHS   multivitamin with minerals  1 tablet Oral Daily   pantoprazole  40 mg Oral Daily   polyethylene glycol  17 g Oral Daily   ranolazine  500 mg Oral BID   senna-docusate  2 tablet Oral BID   sucralfate  1 g Oral TID WC & HS   tamsulosin  0.4 mg Oral QPC supper   Continuous Infusions:  heparin 950 Units/hr (12/19/21 0845)     LOS: 0 days    Time spent: 35 minutes    Barb Merino, MD [facility] Pager [phone]

## 2021-12-19 NOTE — Progress Notes (Signed)
PT Cancellation Note  Patient Details Name: Bryan Scott MRN: [id number] DOB: 18-Feb-1946   Cancelled Treatment:    Reason Eval/Treat Not Completed: Patient at procedure or test/unavailable.  Pt currently not in room (is off floor for testing).  Will re-attempt PT evaluation at a later date/time.  Leitha Bleak, PT 12/19/21, 1:52 PM

## 2021-12-20 ENCOUNTER — Observation Stay (HOSPITAL_BASED_OUTPATIENT_CLINIC_OR_DEPARTMENT_OTHER)
Admit: 2021-12-20 | Discharge: 2021-12-20 | Disposition: A | Discharge status: Home / Self Care | Payer: No Typology Code available for payment source | Attending: Nurse Practitioner | Admitting: Nurse Practitioner

## 2021-12-20 DIAGNOSIS — I639 Cerebral infarction, unspecified: Secondary | ICD-10-CM | POA: Diagnosis not present

## 2021-12-20 DIAGNOSIS — I10 Essential (primary) hypertension: Secondary | ICD-10-CM | POA: Diagnosis not present

## 2021-12-20 DIAGNOSIS — R55 Syncope and collapse: Secondary | ICD-10-CM | POA: Diagnosis not present

## 2021-12-20 DIAGNOSIS — E119 Type 2 diabetes mellitus without complications: Secondary | ICD-10-CM | POA: Diagnosis not present

## 2021-12-20 DIAGNOSIS — K219 Gastro-esophageal reflux disease without esophagitis: Secondary | ICD-10-CM | POA: Diagnosis not present

## 2021-12-20 DIAGNOSIS — I2510 Atherosclerotic heart disease of native coronary artery without angina pectoris: Secondary | ICD-10-CM | POA: Diagnosis not present

## 2021-12-20 DIAGNOSIS — I259 Chronic ischemic heart disease, unspecified: Secondary | ICD-10-CM | POA: Diagnosis not present

## 2021-12-20 DIAGNOSIS — R9431 Abnormal electrocardiogram [ECG] [EKG]: Secondary | ICD-10-CM | POA: Diagnosis not present

## 2021-12-20 DIAGNOSIS — G629 Polyneuropathy, unspecified: Secondary | ICD-10-CM | POA: Diagnosis not present

## 2021-12-20 LAB — BASIC METABOLIC PANEL
Anion gap: 5 (ref 5–15)
BUN: 28 mg/dL — ABNORMAL HIGH (ref 8–23)
CO2: 25 mmol/L (ref 22–32)
Calcium: 8.6 mg/dL — ABNORMAL LOW (ref 8.9–10.3)
Chloride: 104 mmol/L (ref 98–111)
Creatinine, Ser: 1.34 mg/dL — ABNORMAL HIGH (ref 0.61–1.24)
GFR, Estimated: 55 mL/min — ABNORMAL LOW (ref >60)
Glucose, Bld: 132 mg/dL — ABNORMAL HIGH (ref 70–99)
Potassium: 4.1 mmol/L (ref 3.5–5.1)
Sodium: 134 mmol/L — ABNORMAL LOW (ref 135–145)

## 2021-12-20 LAB — CBC
HCT: 41.1 % (ref 39.0–52.0)
Hemoglobin: 14 g/dL (ref 13.0–17.0)
MCH: 31.8 pg (ref 26.0–34.0)
MCHC: 34.1 g/dL (ref 30.0–36.0)
MCV: 93.4 fL (ref 80.0–100.0)
Platelets: 149 10*3/uL — ABNORMAL LOW (ref 150–400)
RBC: 4.4 MIL/uL (ref 4.22–5.81)
RDW: 12.2 % (ref 11.5–15.5)
WBC: 8.8 10*3/uL (ref 4.0–10.5)
nRBC: 0 % (ref 0.0–0.2)

## 2021-12-20 LAB — GLUCOSE, CAPILLARY: Glucose-Capillary: 114 mg/dL — ABNORMAL HIGH (ref 70–99)

## 2021-12-20 LAB — MAGNESIUM: Magnesium: 2.1 mg/dL (ref 1.7–2.4)

## 2021-12-20 MED ORDER — OXYCODONE HCL 5 MG PO TABS: 5.0000 mg | ORAL_TABLET | ORAL | 0 refills | Status: AC | PRN

## 2021-12-20 MED ORDER — OXYCODONE HCL 5 MG PO TABS: 5.0000 mg | ORAL_TABLET | ORAL | 0 refills | Status: DC | PRN

## 2021-12-20 NOTE — NC FL2 (Signed)
[place] LEVEL OF CARE SCREENING TOOL     IDENTIFICATION  Patient Name: Bryan Scott Birthdate: [birth year] Sex: male Admission Date (Current Location): 12/18/2021  [facility] and [place] Number:  Engineering geologist and Address:  [facility], [address] [place], [place] [address]      Provider Number: [id number]  Attending Physician Name and Address:  Barb Merino, MD  Relative Name and Phone Number:  Kean, [place]     [phone] POA    Current Level of Care: Hospital Recommended Level of Care: [place] Prior Approval Number:    Date Approved/Denied:   PASRR Number: [id number]  Discharge Plan: SNF    Current Diagnoses: Patient Active Problem List   Diagnosis Date Noted   Generalized weakness 07/31/2021   Lung nodule 07/26/2021   TIA (transient ischemic attack) 07/25/2021   Chronic diastolic CHF (congestive heart failure) ([place]) 07/25/2021   Ascending aortic aneurysm ([place]) 07/25/2021   Chest pain 02/17/2021   Renal insufficiency 02/17/2021   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   Non-intractable vomiting    Dehydration    Fall    Injury    Malnutrition of moderate degree 09/19/2020   Protein-calorie malnutrition ([place]) 09/18/2020   Syncope 09/17/2020   History of pancreatitis    Cervical radiculopathy    Acute pancreatitis 08/07/2020   Hyperlipidemia associated with type 2 diabetes mellitus ([place]) 08/07/2020   NSTEMI (non-ST elevated myocardial infarction) ([place]) 07/11/2020   AKI (acute kidney injury) ([place]) 27/78/2423   Acute metabolic encephalopathy 53/61/4431   GERD (gastroesophageal reflux disease) 09/09/2019   CAD (coronary artery disease) 09/09/2019   Chronic pain syndrome 09/09/2019   Leukocytosis 09/09/2019   Shortness of breath    Hyponatremia 09/26/2016   Left-sided chest pain 09/22/2016   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   Abnormal EKG 09/21/2016    Chronic pain 09/21/2016   PAF (paroxysmal atrial fibrillation) ([place]) 09/21/2016   Essential hypertension 05/02/2016   Carotid stenosis 05/02/2016   CAD in native artery    Atypical chest pain    DM (diabetes mellitus), type 2 with peripheral vascular complications (HCC)    Hyperlipidemia    Pain of left upper extremity    Angina pectoris (HCC)    Abdominal pain    Chest pain, central 03/13/2015   Hypertensive urgency 03/13/2015   Spinal stenosis 01/10/2012    Orientation RESPIRATION BLADDER Height & Weight     Self, Time, Situation, Place  Normal Continent Weight: 84.7 kg Height:  6' (182.9 cm)  BEHAVIORAL SYMPTOMS/MOOD NEUROLOGICAL BOWEL NUTRITION STATUS        Diet (Regular)  AMBULATORY STATUS COMMUNICATION OF NEEDS Skin   Limited Assist Verbally Other (Comment) (erythema bilateral lower buttocks)                       Personal Care Assistance Level of Assistance  Bathing, Feeding, Dressing Bathing Assistance: Limited assistance Feeding assistance: Limited assistance Dressing Assistance: Limited assistance     Functional Limitations Info  Sight, Hearing, Speech Sight Info: Adequate Hearing Info: Adequate Speech Info: Adequate    SPECIAL CARE FACTORS FREQUENCY                       Contractures Contractures Info: Not present    Additional Factors Info  Code Status, Allergies Code Status Info: FULL CODE Allergies Info: Aspergum (aspirin) High Allergy Anaphylaxis   Aspirin High  Swelling   Lisinopril  High  Anaphylaxis   Angiotensin Receptor Blockers Not Specified   Never taken but PMH of angioedema with ACE-I ( Lisinopril)  Duloxetine Not Specified     Gabapentin Not Specified     Heparin Not Specified           Current Medications (12/20/2021):  This is the current hospital active medication list Current Facility-Administered Medications  Medication Dose Route Frequency Provider Last Rate Last Admin   acetaminophen (TYLENOL) tablet 650 mg  650 mg  Oral Q4H PRN Pokhrel, Laxman, MD       atorvastatin (LIPITOR) tablet 80 mg  80 mg Oral Daily Pokhrel, Laxman, MD   80 mg at 12/20/21 0842   carvedilol (COREG) tablet 6.25 mg  6.25 mg Oral BID WC Pokhrel, Laxman, MD   6.25 mg at 12/20/21 0843   clopidogrel (PLAVIX) tablet 75 mg  75 mg Oral q morning Pokhrel, Laxman, MD   75 mg at 12/20/21 0842   ezetimibe (ZETIA) tablet 10 mg  10 mg Oral Daily Pokhrel, Laxman, MD   10 mg at 12/20/21 0843   feeding supplement (ENSURE ENLIVE / ENSURE PLUS) liquid 237 mL  237 mL Oral TID BM Ghimire, Dante Gang, MD       insulin aspart (novoLOG) injection 0-5 Units  0-5 Units Subcutaneous QHS Pokhrel, Laxman, MD       insulin aspart (novoLOG) injection 0-9 Units  0-9 Units Subcutaneous TID WC Pokhrel, Laxman, MD       isosorbide mononitrate (IMDUR) 24 hr tablet 60 mg  60 mg Oral Daily Pokhrel, Laxman, MD   60 mg at 12/20/21 0842   melatonin tablet 5 mg  5 mg Oral QHS Pokhrel, Laxman, MD   5 mg at 12/19/21 2202   multivitamin with minerals tablet 1 tablet  1 tablet Oral Daily Pokhrel, Laxman, MD   1 tablet at 12/20/21 0842   nitroGLYCERIN (NITROSTAT) SL tablet 0.4 mg  0.4 mg Sublingual Q5 min PRN Pokhrel, Laxman, MD       ondansetron (ZOFRAN) tablet 4 mg  4 mg Oral Daily PRN Pokhrel, Laxman, MD   4 mg at 12/19/21 3662   oxyCODONE (Oxy IR/ROXICODONE) immediate release tablet 5 mg  5 mg Oral Q4H PRN Pokhrel, Laxman, MD   5 mg at 12/20/21 0852   pantoprazole (PROTONIX) EC tablet 40 mg  40 mg Oral Daily Pokhrel, Laxman, MD   40 mg at 12/20/21 0842   polyethylene glycol (MIRALAX / GLYCOLAX) packet 17 g  17 g Oral Daily Pokhrel, Laxman, MD   17 g at 12/19/21 0844   ranolazine (RANEXA) 12 hr tablet 500 mg  500 mg Oral BID Pokhrel, Laxman, MD   500 mg at 12/20/21 0842   senna-docusate (Senokot-S) tablet 2 tablet  2 tablet Oral BID Pokhrel, Laxman, MD   2 tablet at 12/20/21 0842   tamsulosin (FLOMAX) capsule 0.4 mg  0.4 mg Oral QPC supper Pokhrel, Laxman, MD   0.4 mg at 12/19/21 1842    tetrahydrozoline 0.05 % ophthalmic solution 1 drop  1 drop Both Eyes Daily PRN Pokhrel, Corrie Mckusick, MD         Discharge Medications: Please see discharge summary for a list of discharge medications.  Relevant Imaging Results:  Relevant Lab Results:   Additional Information SSN [place] Paloma [place], RN

## 2021-12-20 NOTE — Discharge Summary (Signed)
Physician Discharge Summary  Bryan Scott CHE:[id number] DOB: 1945/07/01 DOA: 12/18/2021  PCP: [name], MD  Admit date: 12/18/2021 Discharge date: 12/20/2021  Admitted From: Nursing home Disposition: Nursing home  Recommendations for Outpatient Follow-up:  Follow up with PCP in 1-2 weeks Cardiology schedule follow-up to check on your device.  Home Health: N/A Equipment/Devices: N/A  Discharge Condition: Fair CODE STATUS: Full code Diet recommendation: Low-salt diet  Discharge summary: 76 year old with history of coronary artery disease status post CABG, history of bladder cancer, chronic pain syndrome, chronic back pain on long-term opiates, GERD, hypertension hyperlipidemia, paroxysmal A-fib, type 2 diabetes who is currently a long-term nursing home resident presented to the ER with midsternal chest pain.  He also has back pain.  Patient also had 2 episodes of passing out spells that were preceded with mild diaphoresis and dizziness. In the emergency room blood pressure elevated.  EKG without acute findings but nonspecific ST changes.  Chest x-ray normal.  CTA of the chest with no evidence of dissection or acute findings.  Nitroglycerin improved the pain.  Admitted to rule out acute coronary syndrome.  Seen by cardiology.  Underwent ischemic evaluation and was normal.     Assessment & plan of care:   Chest pain: Likely musculoskeletal with history of chronic pain syndrome.  Initially treated as unstable angina with heparin infusion.  Currently chest pain improved.  Will use oxycodone as his home dose. EKG and troponins nonischemic.  Underwent echocardiogram that was normal.  Underwent Lexiscan stress test with no evidence of reversible ischemia.  Patient was currently not taking any of his antianginal medications. He will go back on Plavix, Imdur, Ranexa and beta-blockers.  Long-term pain management with oxycodone.   Syncope: Vagal, orthostatic or related to pain.  Echocardiogram  without abnormalities.  Telemetry without events.  Patient will be prescribed Zio patch for event monitoring and followed by cardiology as outpatient.  Paroxysmal A-fib: Currently sinus rhythm.  Therapeutic on Eliquis.  Rate controlled on Coreg.  Essential hypertension: Continue Coreg.  Continue Imdur.    Acute kidney injury: Probably secondary to transient hypotension and contrast studies overnight. Already improving.   Patient is stable to discharge back to nursing home today.  He will be monitored as outpatient by cardiology.  All-time fall precautions.  Discharge Diagnoses:  Principal Problem:   Syncope Active Problems:   Abnormal EKG   PAF (paroxysmal atrial fibrillation) (HCC)   DM (diabetes mellitus), type 2 with peripheral vascular complications (HCC)   Hyperlipidemia   Essential hypertension   CAD (coronary artery disease)   Chronic pain syndrome   Hyperlipidemia associated with type 2 diabetes mellitus (HCC)   Chest pain    Discharge Instructions  Discharge Instructions     Diet - low sodium heart healthy   Complete by: As directed    Diet Carb Modified   Complete by: As directed    Increase activity slowly   Complete by: As directed       Allergies as of 12/20/2021       Reactions   Aspergum [aspirin] Anaphylaxis   Aspirin Swelling   Lisinopril Anaphylaxis   Angiotensin Receptor Blockers    Never taken but PMH of angioedema with ACE-I ( Lisinopril)   Duloxetine    Gabapentin    Heparin Other (See Comments)   Reaction:  Bleeding         Medication List     STOP taking these medications    diphenhydrAMINE 25 mg capsule Commonly known  as: BENADRYL   DULoxetine 30 MG capsule Commonly known as: CYMBALTA   gabapentin 100 MG capsule Commonly known as: NEURONTIN   melatonin 5 MG Tabs       TAKE these medications    acetaminophen 325 MG tablet Commonly known as: TYLENOL Take 2 tablets (650 mg total) by mouth every 4 (four) hours as  needed for mild pain (or temp > 37.5 C (99.5 F)). What changed: Another medication with the same name was removed. Continue taking this medication, and follow the directions you see here.   apixaban 5 MG Tabs tablet Commonly known as: ELIQUIS Take 5 mg by mouth 2 (two) times daily.   atorvastatin 80 MG tablet Commonly known as: LIPITOR Take 1 tablet (80 mg total) by mouth daily.   carvedilol 6.25 MG tablet Commonly known as: COREG Take 1 tablet (6.25 mg total) by mouth 2 (two) times daily with a meal.   clopidogrel 75 MG tablet Commonly known as: PLAVIX Take 1 tablet (75 mg total) by mouth every morning.   empagliflozin 25 MG Tabs tablet Commonly known as: Jardiance Take 1 tablet (25 mg total) by mouth daily.   ezetimibe 10 MG tablet Commonly known as: ZETIA Take 1 tablet (10 mg total) by mouth daily.   feeding supplement (GLUCERNA 1.2 CAL) Liqd Take 237 mLs by mouth daily in the afternoon.   isosorbide mononitrate 60 MG 24 hr tablet Commonly known as: IMDUR Take 1 tablet (60 mg total) by mouth daily.   metFORMIN 500 MG tablet Commonly known as: GLUCOPHAGE Take 1 tablet (500 mg total) by mouth daily with breakfast.   multivitamin with minerals Tabs tablet Take 1 tablet by mouth daily.   nitroGLYCERIN 0.4 MG SL tablet Commonly known as: NITROSTAT Place 1 tablet (0.4 mg total) under the tongue every 5 (five) minutes as needed for chest pain.   ondansetron 4 MG tablet Commonly known as: Zofran Take 1 tablet (4 mg total) by mouth daily as needed for nausea or vomiting.   oxyCODONE 5 MG immediate release tablet Commonly known as: Roxicodone Take 1 tablet (5 mg total) by mouth every 4 (four) hours as needed for up to 5 days for severe pain.   pantoprazole 40 MG tablet Commonly known as: PROTONIX Take 1 tablet (40 mg total) by mouth daily.   polyethylene glycol 17 g packet Commonly known as: MIRALAX / GLYCOLAX Take 17 g by mouth daily.   ranolazine 500 MG 12 hr  tablet Commonly known as: RANEXA Take 1 tablet (500 mg total) by mouth 2 (two) times daily.   senna-docusate 8.6-50 MG tablet Commonly known as: Senokot-S Take 2 tablets by mouth 2 (two) times daily.   sucralfate 1 g tablet Commonly known as: CARAFATE Take 1 tablet (1 g total) by mouth 4 (four) times daily -  with meals and at bedtime.   tamsulosin 0.4 MG Caps capsule Commonly known as: FLOMAX Take 0.4 mg by mouth.   VISINE OP Place 1 drop into both eyes daily as needed (dry red eyes).        Allergies  Allergen Reactions   Aspergum [Aspirin] Anaphylaxis   Aspirin Swelling   Lisinopril Anaphylaxis   Angiotensin Receptor Blockers     Never taken but PMH of angioedema with ACE-I ( Lisinopril)   Duloxetine    Gabapentin    Heparin Other (See Comments)    Reaction:  Bleeding     Consultations: Cardiology   Procedures/Studies: NM Myocar Multi W/Spect W/Wall Motion /  EF  Result Date: 12/19/2021 Pharmacological myocardial perfusion imaging study with no significant  ischemia Normal wall motion, EF estimated at 70%, GI uptake artifact noted No EKG changes concerning for ischemia at peak stress or in recovery. CT attenuation correction images with coronary calcification and aortic atherosclerosis Low risk scan Signed, Esmond Plants, MD, Ph.D [facility]   ECHOCARDIOGRAM COMPLETE  Result Date: 12/19/2021    ECHOCARDIOGRAM REPORT   Patient Name:   THERAN VANDERGRIFT Date of Exam: 12/19/2021 Medical Rec #:  [id number]    Height:       72.0 in Accession #:    [id number]   Weight:       186.7 lb Date of Birth:  1946-04-23     BSA:          2.069 m Patient Age:    41 years     BP:           108/52 mmHg Patient Gender: M            HR:           73 bpm. Exam Location:  [facility] Procedure: 2D Echo, Color Doppler, Cardiac Doppler and Intracardiac            Opacification Agent Indications:     hest pain R  History:         Patient has prior history of Echocardiogram examinations, most                   recent 09/04/2020. CAD, Prior CABG; Risk Factors:Hypertension                  and Diabetes. Ischemic cardiomyopathy.  Sonographer:     Sherrie Sport Referring Phys:  [id number] [facility] Diagnosing Phys: Ida Rogue MD  Sonographer Comments: Technically difficult study due to poor echo windows, suboptimal parasternal window, suboptimal apical window and suboptimal subcostal window. IMPRESSIONS  1. Left ventricular ejection fraction, by estimation, is 55 to 60%. The left ventricle has normal function. The left ventricle has no regional wall motion abnormalities. There is mild left ventricular hypertrophy. Left ventricular diastolic parameters are consistent with Grade I diastolic dysfunction (impaired relaxation).  2. Right ventricular systolic function is normal. The right ventricular size is normal. There is normal pulmonary artery systolic pressure. The estimated right ventricular systolic pressure is 31.5 mmHg.  3. The mitral valve is normal in structure. No evidence of mitral valve regurgitation. No evidence of mitral stenosis.  4. The aortic valve is normal in structure. Aortic valve regurgitation is not visualized. Aortic valve sclerosis is present, with no evidence of aortic valve stenosis.  5. The inferior vena cava is normal in size with greater than 50% respiratory variability, suggesting right atrial pressure of 3 mmHg. FINDINGS  Left Ventricle: Left ventricular ejection fraction, by estimation, is 55 to 60%. The left ventricle has normal function. The left ventricle has no regional wall motion abnormalities. Definity contrast agent was given IV to delineate the left ventricular  endocardial borders. The left ventricular internal cavity size was normal in size. There is mild left ventricular hypertrophy. Left ventricular diastolic parameters are consistent with Grade I diastolic dysfunction (impaired relaxation). Right Ventricle: The right ventricular size is normal. No increase in right ventricular  wall thickness. Right ventricular systolic function is normal. There is normal pulmonary artery systolic pressure. The tricuspid regurgitant velocity is 1.91 m/s, and  with an assumed right atrial pressure of 5 mmHg, the estimated right ventricular systolic pressure  is 19.6 mmHg. Left Atrium: Left atrial size was normal in size. Right Atrium: Right atrial size was normal in size. Pericardium: There is no evidence of pericardial effusion. Mitral Valve: The mitral valve is normal in structure. No evidence of mitral valve regurgitation. No evidence of mitral valve stenosis. Tricuspid Valve: The tricuspid valve is normal in structure. Tricuspid valve regurgitation is not demonstrated. No evidence of tricuspid stenosis. Aortic Valve: The aortic valve is normal in structure. Aortic valve regurgitation is not visualized. Aortic valve sclerosis is present, with no evidence of aortic valve stenosis. Aortic valve mean gradient measures 1.0 mmHg. Aortic valve peak gradient measures 2.1 mmHg. Aortic valve area, by VTI measures 4.21 cm. Pulmonic Valve: The pulmonic valve was normal in structure. Pulmonic valve regurgitation is not visualized. No evidence of pulmonic stenosis. Aorta: The aortic root is normal in size and structure. Venous: The inferior vena cava is normal in size with greater than 50% respiratory variability, suggesting right atrial pressure of 3 mmHg. IAS/Shunts: No atrial level shunt detected by color flow Doppler.  LEFT VENTRICLE PLAX 2D LVIDd:         3.10 cm   Diastology LVIDs:         2.00 cm   LV e' medial:    4.46 cm/s LV PW:         1.00 cm   LV E/e' medial:  12.2 LV IVS:        1.30 cm   LV e' lateral:   6.20 cm/s LVOT diam:     2.20 cm   LV E/e' lateral: 8.8 LV SV:         51 LV SV Index:   24 LVOT Area:     3.80 cm  LEFT ATRIUM             Index LA diam:        2.50 cm 1.21 cm/m LA Vol (A2C):   23.1 ml 11.16 ml/m LA Vol (A4C):   14.5 ml 7.01 ml/m LA Biplane Vol: 20.3 ml 9.81 ml/m  AORTIC VALVE  AV Area (Vmax):    3.19 cm AV Area (Vmean):   3.20 cm AV Area (VTI):     4.21 cm AV Vmax:           73.20 cm/s AV Vmean:          53.200 cm/s AV VTI:            0.120 m AV Peak Grad:      2.1 mmHg AV Mean Grad:      1.0 mmHg LVOT Vmax:         61.40 cm/s LVOT Vmean:        44.800 cm/s LVOT VTI:          0.133 m LVOT/AV VTI ratio: 1.11  AORTA Ao Root diam: 3.30 cm MITRAL VALVE               TRICUSPID VALVE MV Area (PHT): 5.58 cm    TR Peak grad:   14.6 mmHg MV Decel Time: 136 msec    TR Vmax:        191.00 cm/s MV E velocity: 54.40 cm/s MV A velocity: 64.30 cm/s  SHUNTS MV E/A ratio:  0.85        Systemic VTI:  0.13 m                            Systemic Diam:  2.20 cm Ida Rogue MD Electronically signed by Ida Rogue MD Signature Date/Time: 12/19/2021/2:32:41 PM    Final    CT Angio Chest/Abd/Pel for Dissection W and/or Wo Contrast  Result Date: 12/18/2021 CLINICAL DATA:  A 76 year old male presents for evaluation of suspected aortic dissection with sudden onset of back and chest pain. EXAM: CT ANGIOGRAPHY CHEST, ABDOMEN AND PELVIS TECHNIQUE: Non-contrast CT of the chest was initially obtained. Multidetector CT imaging through the chest, abdomen and pelvis was performed using the standard protocol during bolus administration of intravenous contrast. Multiplanar reconstructed images and MIPs were obtained and reviewed to evaluate the vascular anatomy. RADIATION DOSE REDUCTION: This exam was performed according to the departmental dose-optimization program which includes automated exposure control, adjustment of the mA and/or kV according to patient size and/or use of iterative reconstruction technique. CONTRAST:  1104m OMNIPAQUE IOHEXOL 350 MG/ML SOLN COMPARISON:  July 24, 2021. FINDINGS: CTA CHEST FINDINGS Cardiovascular: Normal heart size. No pericardial effusion or pericardial thickening. Post median sternotomy for coronary revascularization as before. Normal caliber of the thoracic aorta. Aortic  atherosclerosis both calcified and noncalcified with similar appearance. No aneurysmal dilation. No dissection or signs of vasculitis. Dedicated origin of LEFT vertebral either from the LEFT subclavian at the origin of LEFT subclavian. Precontrast imaging without signs of intramural hematoma. Study is negative for central or lobar level pulmonary embolism. Mediastinum/Nodes: No thoracic inlet, axillary, mediastinal or hilar adenopathy. Esophagus grossly normal. Lungs/Pleura: No effusion. No consolidative changes. Airways are patent. No pneumothorax. Nodular area in the RIGHT upper lobe inferiorly along the pleural surface 8 x 3 mm (image 84/6) biapical pleural and parenchymal scarring. Musculoskeletal: See below for full musculoskeletal details. Review of the MIP images confirms the above findings. CTA ABDOMEN AND PELVIS FINDINGS VASCULAR Aorta: Aortic atherosclerosis both calcified and noncalcified without signs of aneurysmal dilation. Celiac: Celiac with atherosclerotic changes at the origin of the celiac artery, no substantial narrowing. Dedicated origin of hepatic artery. SMA: Soft plaque with segmental narrowing of the SMA showing moderate narrowing at the level of jejunal branches that is similar to prior imaging. Vessel is patent without signs of dissection or vasculitis and without change from imaging in February. Renals: Single of renal arteries bilaterally which are widely patent. IMA: Patent without evidence of aneurysm, dissection, vasculitis or significant stenosis. Inflow: Patent without evidence of aneurysm, dissection, vasculitis or significant stenosis. Veins: No obvious venous abnormality within the limitations of this arterial phase study. Outflow into the upper thigh is preserved with atherosclerotic changes in the iliac vasculature and moderate to marked narrowing of the RIGHT common iliac (image 168/5 due to mixture of calcified and noncalcified plaque, this is at the terminal aspect of the  stented vessel just proximal to the iliac bifurcation. Review of the MIP images confirms the above findings. NON-VASCULAR Hepatobiliary: Smooth hepatic contours. No pericholecystic stranding. No change in mild prominence of the common bile duct between 5 and 6 mm. No gross intrahepatic biliary duct distension. Pancreas: Normal, without mass, inflammation or ductal dilatation. Spleen: Normal. Adrenals/Urinary Tract: Adrenal glands are normal. Nephrolithiasis on the RIGHT is unchanged grossly 12 mm calculus in the interpolar RIGHT kidney. No suspicious renal lesion, hydronephrosis or ureteral calculi. Stomach/Bowel: Normal appendix. No acute gastrointestinal process.  Stool fills much of the colon. 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 Findings are similar to previous imaging. No adenopathy in the abdomen or in the pelvis. Reproductive: Moderate prostatomegaly. Other: No ascites. Musculoskeletal: No  acute bone finding. No destructive bone process. Spinal degenerative changes. Moderate narrowing of the central canal at L5-S1 similar to previous imaging. Signs of prior trauma to the LEFT hemipelvis, iliac bone similar to previous imaging. Evidence of prior spinal fusion at L3-4 and L4-5. Review of the MIP images confirms the above findings. IMPRESSION: 1. No evidence of thoracic or abdominal aortic aneurysm or dissection. Marked vascular disease and signs of coronary revascularization and prior vascular stenting as before. 2. Atherosclerotic Narrowing of the SMA with soft plaque is unchanged and at least moderate at the level of jejunal branches. 3. No signs of central or lobar level pulmonary embolism. 4. Degenerative changes of the spine with evidence of prior spinal fusion moderate central canal narrowing at L5-S1. 5. Moderate to marked narrowing of the RIGHT common iliac due to mixture of calcified and noncalcified plaque. 6. 6 mm right solid  pulmonary nodule within the upper lobe. Recommend a non-contrast Chest CT at 6-12 months. If patient is high risk for malignancy, recommend an additional non-contrast Chest CT at 18-24 months; if patient is low risk for malignancy a non-contrast Chest CT at 18-24 months is optional. these guidelines do not apply to immunocompromised patients and patients with cancer. Follow up in patients with significant comorbidities as clinically warranted. For lung cancer screening, adhere to Lung-RADS guidelines. Reference: Radiology. 2017; 284(1):228-43. 7. Nonobstructive RIGHT renal calculus.  This measures 12 mm. 8. Moderate prostatomegaly. Aortic Atherosclerosis (ICD10-I70.0). Electronically Signed   By: Zetta Bills M.D.   On: 12/18/2021 13:39   DG Chest Port 1 View  Result Date: 12/18/2021 CLINICAL DATA:  Chest pain with nausea and dizziness EXAM: PORTABLE CHEST 1 VIEW COMPARISON:  03/21/2021 FINDINGS: Lower cervical plate and screw fixator. Prior CABG. Heart size within normal limits. Left proximal humeral plate and screw fixator. No blunting of the costophrenic angles. Minimal scarring at the left lung base. No acute thoracic findings. IMPRESSION: 1. No acute/active thoracic findings. 2. Prior CABG. 3. Minimal scarring at the left lung base. Electronically Signed   By: Van Clines M.D.   On: 12/18/2021 12:42   (Echo, Carotid, EGD, Colonoscopy, ERCP)    Subjective: Patient seen and examined.  He was surprised to see that his heart is fine.  He was agreeable to go back to nursing home.   Discharge Exam: Vitals:   12/20/21 0412 12/20/21 0743  BP: (!) 112/56 (!) 141/78  Pulse: 63 65  Resp: 20 17  Temp: 97.9 F (36.6 C) 97.9 F (36.6 C)  SpO2: 95% 99%   Vitals:   12/19/21 1950 12/19/21 2338 12/20/21 0412 12/20/21 0743  BP: 108/61 99/63 (!) 112/56 (!) 141/78  Pulse: 69 71 63 65  Resp: '20 20 20 17  '$ Temp: 98.5 F (36.9 C) 98.6 F (37 C) 97.9 F (36.6 C) 97.9 F (36.6 C)  TempSrc: Oral  Oral Oral   SpO2: 94% 94% 95% 99%  Weight:      Height:        General: Pt is alert, awake, not in acute distress Frail and debilitated.  Not in any distress. Cardiovascular: RRR, S1/S2 +, no rubs, no gallops.  No reproducible chest pain. Respiratory: CTA bilaterally, no wheezing, no rhonchi Abdominal: Soft, NT, ND, bowel sounds + Extremities: no edema, no cyanosis    The results of significant diagnostics from this hospitalization (including imaging, microbiology, ancillary and laboratory) are listed below for reference.     Microbiology: No results found for this or any previous visit (from  the past 240 hour(s)).   Labs: BNP (last 3 results) Recent Labs    02/17/21 1147  BNP 12.1   Basic Metabolic Panel: Recent Labs  Lab 12/18/21 1217 12/19/21 0624 12/20/21 0507  NA 134* 134* 134*  K 4.6 4.5 4.1  CL 100 103 104  CO2 24 21* 25  GLUCOSE 132* 128* 132*  BUN 18 25* 28*  CREATININE 0.92 1.41* 1.34*  CALCIUM 9.6 9.4 8.6*  MG  --  2.3 2.1   Liver Function Tests: Recent Labs  Lab 12/18/21 1217  AST 30  ALT 24  ALKPHOS 75  BILITOT 3.1*  PROT 7.7  ALBUMIN 4.2   No results for input(s): "LIPASE", "AMYLASE" in the last 168 hours. No results for input(s): "AMMONIA" in the last 168 hours. CBC: Recent Labs  Lab 12/18/21 1217 12/19/21 0624 12/20/21 0507  WBC 9.3 9.9 8.8  NEUTROABS 6.5  --   --   HGB 17.3* 15.2 14.0  HCT 51.3 44.9 41.1  MCV 96.4 95.5 93.4  PLT 174 168 149*   Cardiac Enzymes: No results for input(s): "CKTOTAL", "CKMB", "CKMBINDEX", "TROPONINI" in the last 168 hours. BNP: Invalid input(s): "POCBNP" CBG: Recent Labs  Lab 12/19/21 0754 12/19/21 1142 12/19/21 1633 12/19/21 2052 12/20/21 0743  GLUCAP 102* 110* 177* 199* 114*   D-Dimer No results for input(s): "DDIMER" in the last 72 hours. Hgb A1c No results for input(s): "HGBA1C" in the last 72 hours. Lipid Profile No results for input(s): "CHOL", "HDL", "LDLCALC", "TRIG",  "CHOLHDL", "LDLDIRECT" in the last 72 hours. Thyroid function studies No results for input(s): "TSH", "T4TOTAL", "T3FREE", "THYROIDAB" in the last 72 hours.  Invalid input(s): "FREET3" Anemia work up No results for input(s): "VITAMINB12", "FOLATE", "FERRITIN", "TIBC", "IRON", "RETICCTPCT" in the last 72 hours. Urinalysis    Component Value Date/Time   COLORURINE STRAW (A) 07/24/2021 2048   APPEARANCEUR CLEAR (A) 07/24/2021 2048   APPEARANCEUR Clear 10/15/2013 2200   LABSPEC >1.046 (H) 07/24/2021 2048   LABSPEC 1.012 10/15/2013 2200   PHURINE 6.0 07/24/2021 2048   GLUCOSEU >=500 (A) 07/24/2021 2048   GLUCOSEU Negative 10/15/2013 2200   HGBUR NEGATIVE 07/24/2021 2048   BILIRUBINUR NEGATIVE 07/24/2021 2048   BILIRUBINUR Negative 10/15/2013 2200   KETONESUR NEGATIVE 07/24/2021 2048   PROTEINUR NEGATIVE 07/24/2021 2048   NITRITE NEGATIVE 07/24/2021 2048   LEUKOCYTESUR NEGATIVE 07/24/2021 2048   LEUKOCYTESUR Negative 10/15/2013 2200   Sepsis Labs Recent Labs  Lab 12/18/21 1217 12/19/21 0624 12/20/21 0507  WBC 9.3 9.9 8.8   Microbiology No results found for this or any previous visit (from the past 240 hour(s)).   Time coordinating discharge: 35 minutes  SIGNED:   Barb Merino, MD  [facility] 12/20/2021, 8:59 AM

## 2021-12-20 NOTE — Evaluation (Signed)
Physical Therapy Evaluation Patient Details Name: Bryan Scott MRN: [id number] DOB: [birth year] Today's Date: 12/20/2021  History of Present Illness  Pt  is a 76 years old male with history of coronary artery disease status post CABG, history of bladder cancer, chronic pain syndrome, GERD, hyperlipidemia, hypertension, history of paroxysmal atrial fibrillation, type 2 diabetes, in the past presented to the hospital from [facility] facility with complaints of chest pain and almost passing out. MD assessment includes Syncope and unstable angina  Clinical Impression  Pt was slightly agitated  but overall willing to participate during the session and put forth good effort throughout. Pt was able to complete supine to sit with minA needed for trunk control. Upon sitting pt reports symptoms of dizziness but resolved quickly. Pt completed stand pivot transfer with min-modA  to prevent LOB due to instance on knee buckling. Pt reports feeling weaker compared to baseline. Currently is receiving HHPT at his ALF where he has as needed assistance and will continue to benefit from [place] upon discharge to safely address deficits listed in patient problem list for decreased caregiver assistance and eventual return to PLOF.        Recommendations for follow up therapy are one component of a multi-disciplinary discharge planning process, led by the attending physician.  Recommendations may be updated based on patient status, additional functional criteria and insurance authorization.  Follow Up Recommendations Home health PT      Assistance Recommended at Discharge Intermittent Supervision/Assistance  Patient can return home with the following  A little help with walking and/or transfers;A little help with bathing/dressing/bathroom;Assistance with cooking/housework;Assist for transportation;Help with stairs or ramp for entrance    Equipment Recommendations None recommended by PT  Recommendations for Other  Services       Functional Status Assessment Patient has had a recent decline in their functional status and demonstrates the ability to make significant improvements in function in a reasonable and predictable amount of time.     Precautions / Restrictions Precautions Precautions: Fall Restrictions Weight Bearing Restrictions: No      Mobility  Bed Mobility Overal bed mobility: Needs Assistance Bed Mobility: Supine to Sit     Supine to sit: Min assist     General bed mobility comments: minA for trunk control to sit up    Transfers Overall transfer level: Needs assistance Equipment used: None Transfers: Bed to chair/wheelchair/BSC   Stand pivot transfers: Min assist, Mod assist         General transfer comment: instance of knee buckling requiring minA to prevent LOB    Ambulation/Gait               General Gait Details: not attempted; w/c mobility at baseline  Stairs            Wheelchair Mobility    Modified Rankin (Stroke Patients Only)       Balance Overall balance assessment: Needs assistance Sitting-balance support: Bilateral upper extremity supported, Feet supported Sitting balance-Leahy Scale: Good     Standing balance support: Bilateral upper extremity supported, During functional activity Standing balance-Leahy Scale: Poor Standing balance comment: instance of knee buckling during stand pivot requiring minA correct to preven LOB                             Pertinent Vitals/Pain Pain Assessment Pain Assessment: 0-10 Pain Score: 10-Worst pain ever Pain Location: General body pain Pain Descriptors / Indicators: Aching Pain Intervention(s): Monitored  during session, Premedicated before session, Repositioned    Home Living Family/patient expects to be discharged to:: Assisted living Living Arrangements: Alone Available Help at Discharge: Available 24 hours/day Type of Home: Assisted living           Home  Equipment: Rollator (4 wheels);Wheelchair - manual      Prior Function Prior Level of Function : Independent/Modified Independent             Mobility Comments: Ind w/ w/c mobility and completing stand pivot transfers. Reports limited walking only with PT. hx of several falls, unclear to how many ADLs Comments: Ind w/ ADLs     Hand Dominance        Extremity/Trunk Assessment   Upper Extremity Assessment Upper Extremity Assessment: Overall WFL for tasks assessed    Lower Extremity Assessment Lower Extremity Assessment: Generalized weakness       Communication   Communication: No difficulties  Cognition Arousal/Alertness: Awake/alert Behavior During Therapy: WFL for tasks assessed/performed Overall Cognitive Status: Within Functional Limits for tasks assessed                                          General Comments      Exercises General Exercises - Lower Extremity Ankle Circles/Pumps: Strengthening, Both, 10 reps Quad Sets: Strengthening, Both, 10 reps Gluteal Sets: Strengthening, Both, 10 reps Long Arc Quad: Strengthening, Both, 10 reps Heel Slides: Strengthening, Both, 10 reps   Assessment/Plan    PT Assessment Patient needs continued PT services  PT Problem List Decreased strength;Decreased mobility;Decreased activity tolerance;Decreased balance;Decreased knowledge of use of DME       PT Treatment Interventions DME instruction;Therapeutic exercise;Balance training;Gait training;Functional mobility training;Therapeutic activities;Patient/family education    PT Goals (Current goals can be found in the Care Plan section)  Acute Rehab PT Goals Patient Stated Goal: none stated PT Goal Formulation: With patient Time For Goal Achievement: 01/02/22 Potential to Achieve Goals: Fair    Frequency Min 2X/week     Co-evaluation               AM-PAC PT "6 Clicks" Mobility  Outcome Measure Help needed turning from your back to your  side while in a flat bed without using bedrails?: A Little Help needed moving from lying on your back to sitting on the side of a flat bed without using bedrails?: A Little Help needed moving to and from a bed to a chair (including a wheelchair)?: A Little Help needed standing up from a chair using your arms (e.g., wheelchair or bedside chair)?: A Little Help needed to walk in hospital room?: A Lot Help needed climbing 3-5 steps with a railing? : A Lot 6 Click Score: 16    End of Session Equipment Utilized During Treatment: Gait belt Activity Tolerance: Patient tolerated treatment well Patient left: in chair;with call bell/phone within reach;with chair alarm set Nurse Communication: Mobility status PT Visit Diagnosis: Unsteadiness on feet (R26.81);Difficulty in walking, not elsewhere classified (R26.2);Muscle weakness (generalized) (M62.81);Pain Pain - Right/Left:  (general body)    Time: 3662-9476 PT Time Calculation (min) (ACUTE ONLY): 33 min   Charges:              Turner Daniels, SPT  12/20/2021, 11:55 AM

## 2021-12-20 NOTE — TOC Initial Note (Signed)
Transition of Care [facility]) - Initial/Assessment Note    Patient Details  Name: Bryan Scott MRN: [id number] Date of Birth: 03/09/1946  Transition of Care [facility] CM/SW Contact:    Pete Pelt, RN Phone Number: 12/20/2021, 10:20 AM  Clinical Narrative:    Patient is a long term [facility] of [facility].  Spoke to [employer] at [facility], patient is able to return today and will transport via facility transport.  Patient will return to room 320P when discharged.  RN given room number andPhone: [phone] to call report.              Expected Discharge Plan: Long Term Nursing Home Barriers to Discharge: Barriers Resolved   Patient Goals and CMS Choice        Expected Discharge Plan and Services Expected Discharge Plan: [place] Acute Care Choice: Nursing Home Living arrangements for the past 2 months: [place] Expected Discharge Date: 12/20/21                                    Prior Living Arrangements/Services Living arrangements for the past 2 months: [place] Lives with:: Facility Resident Patient language and need for interpreter reviewed:: Yes (No interpreter required) Do you feel safe going back to the place where you live?: Yes      Need for Family Participation in Patient Care: Yes (Comment) Care giver support system in place?: Yes (comment)   Criminal Activity/Legal Involvement Pertinent to Current Situation/Hospitalization: No - Comment as needed  Activities of Daily Living Home Assistive Devices/Equipment: Wheelchair ADL Screening (condition at time of admission) Patient's cognitive ability adequate to safely complete daily activities?: Yes Is the patient deaf or have difficulty hearing?: No Does the patient have difficulty seeing, even when wearing glasses/contacts?: No Does the patient have difficulty concentrating, remembering, or making decisions?: No Patient able to express  need for assistance with ADLs?: Yes Does the patient have difficulty dressing or bathing?: No Independently performs ADLs?: Yes (appropriate for developmental age) Does the patient have difficulty walking or climbing stairs?: Yes Weakness of Legs: Both Weakness of Arms/Hands: None  Permission Sought/Granted Permission sought to share information with : Facility Sport and exercise psychologist, Case Optician, dispensing granted to share information with : Yes, Verbal Permission Granted     Permission granted to share info w AGENCY: [facility] (patient is a resident)        Emotional Assessment Appearance:: Appears stated age Attitude/Demeanor/Rapport: Engaged, Gracious Affect (typically observed): Appropriate Orientation: : Oriented to Self, Oriented to Place, Oriented to  Time, Oriented to Situation Alcohol / Substance Use: Not Applicable Psych Involvement: No (comment)  Admission diagnosis:  Syncope [R55] Nonspecific chest pain [R07.9] Patient Active Problem List   Diagnosis Date Noted   Generalized weakness 07/31/2021   Lung nodule 07/26/2021   TIA (transient ischemic attack) 07/25/2021   Chronic diastolic CHF (congestive heart failure) ([place]) 07/25/2021   Ascending aortic aneurysm ([place]) 07/25/2021   Chest pain 02/17/2021   Renal insufficiency 02/17/2021   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   Non-intractable vomiting    Dehydration    Fall    Injury    Malnutrition of moderate degree 09/19/2020   Protein-calorie malnutrition ([place]) 09/18/2020   Syncope 09/17/2020   History of pancreatitis    Cervical radiculopathy    Acute pancreatitis 08/07/2020   Hyperlipidemia  associated with type 2 diabetes mellitus ([place]) 08/07/2020   NSTEMI (non-ST elevated myocardial infarction) ([place]) 07/11/2020   AKI (acute kidney injury) ([place]) 84/69/6295   Acute metabolic encephalopathy 28/41/3244   GERD (gastroesophageal reflux disease) 09/09/2019   CAD (coronary artery disease)  09/09/2019   Chronic pain syndrome 09/09/2019   Leukocytosis 09/09/2019   Shortness of breath    Hyponatremia 09/26/2016   Left-sided chest pain 09/22/2016   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   Abnormal EKG 09/21/2016   Chronic pain 09/21/2016   PAF (paroxysmal atrial fibrillation) ([place]) 09/21/2016   Essential hypertension 05/02/2016   Carotid stenosis 05/02/2016   CAD in native artery    Atypical chest pain    DM (diabetes mellitus), type 2 with peripheral vascular complications (HCC)    Hyperlipidemia    Pain of left upper extremity    Angina pectoris ([place])    Abdominal pain    Chest pain, central 03/13/2015   Hypertensive urgency 03/13/2015   Spinal stenosis 01/10/2012   PCP:  [name], MD Pharmacy:   [place], [place] [address] [place] [address] Phone: [phone] Fax: [place], [place] [address] Phone: [phone] Fax: [phone]     Social Determinants of Health ([place]) Interventions    Readmission Risk Interventions     No data to display

## 2021-12-21 DIAGNOSIS — R55 Syncope and collapse: Secondary | ICD-10-CM | POA: Diagnosis not present

## 2021-12-24 ENCOUNTER — Telehealth: Payer: Self-pay | Admitting: Internal Medicine

## 2021-12-24 DIAGNOSIS — I4891 Unspecified atrial fibrillation: Secondary | ICD-10-CM | POA: Diagnosis not present

## 2021-12-24 DIAGNOSIS — I10 Essential (primary) hypertension: Secondary | ICD-10-CM | POA: Diagnosis not present

## 2021-12-24 DIAGNOSIS — K219 Gastro-esophageal reflux disease without esophagitis: Secondary | ICD-10-CM | POA: Diagnosis not present

## 2021-12-24 DIAGNOSIS — Z79899 Other long term (current) drug therapy: Secondary | ICD-10-CM | POA: Diagnosis not present

## 2021-12-24 DIAGNOSIS — G629 Polyneuropathy, unspecified: Secondary | ICD-10-CM | POA: Diagnosis not present

## 2021-12-24 DIAGNOSIS — I2510 Atherosclerotic heart disease of native coronary artery without angina pectoris: Secondary | ICD-10-CM | POA: Diagnosis not present

## 2021-12-24 DIAGNOSIS — E119 Type 2 diabetes mellitus without complications: Secondary | ICD-10-CM | POA: Diagnosis not present

## 2021-12-24 DIAGNOSIS — R55 Syncope and collapse: Secondary | ICD-10-CM | POA: Diagnosis not present

## 2021-12-24 NOTE — Telephone Encounter (Signed)
It appears that the patient has been seen during several admissions by our practice but has never been seen in the office.  Event monitor was recommended by Dr. Rockey Situ during hospitalization earlier this month for syncope.  He has a h/o paroxysmal atrial fibrillation and is currently on apixaban.  I recommend that he follow-up with an APP after completion of the 14-day event monitor.  No medication changes recommended at this time.  Nelva Bush, MD [facility]

## 2021-12-24 NOTE — Telephone Encounter (Signed)
Called and spoke with Claiborne Billings at [facility].     Cardiac Monitor Alert  Date of alert:  12/24/2021   Patient Name: Bryan Scott  DOB: June 24, 1945  MRN: [id number]   [facility] Cardiologist: Nelva Bush, MD  [facility] EP:  None    Monitor Information: Long Term Monitor-Live Telemetry [ZioAT]  Reason:  1st alert for atrial flutter Ordering provider:  End   Alert Atrial Fibrillation/Flutter This is the 1st alert for this rhythm - The event occurred at 5:31 pm on 12/23/21 (Atrial Flutter) with heart rates of 81-104 bpm. - The transmission was 90 seconds - The event was auto triggered - Last transmission was a daily report that was received at 1240 am today. - The rhythm was atrial fibrillation with heart rates of 71-113 bpm. The patient has a hx of Atrial Fibrillation/Flutter.  The patient is currently on anticoagulation. Anticoagulation medication as of 12/24/2021           apixaban (ELIQUIS) 5 MG TABS tablet Take 5 mg by mouth 2 (two) times daily.       Next Cardiology Appointment   Date:  Not currently scheduled  Provider:  n/a  I did not reach out to the patient today as the event was an autotrigger.  Will defer further plan of care to Dr. Saunders Revel    Other: Will forward to Dr. Saunders Revel to determine a plan of care going forward and confirm when the patient should be seen in the office for follow up.  Alvis Lemmings, RN  12/24/2021 10:06 AM

## 2021-12-24 NOTE — Telephone Encounter (Signed)
Please schedule pt for follow up with APP after completion of the 14-day event monitor. Thank you.

## 2021-12-24 NOTE — Telephone Encounter (Signed)
Kelly from [employer] by [facility] calling to report abnormal reading.  Tried to connect to triage, but [employer] hung up before I could.

## 2021-12-25 DIAGNOSIS — I4891 Unspecified atrial fibrillation: Secondary | ICD-10-CM | POA: Diagnosis not present

## 2021-12-25 DIAGNOSIS — I2510 Atherosclerotic heart disease of native coronary artery without angina pectoris: Secondary | ICD-10-CM | POA: Diagnosis not present

## 2021-12-25 DIAGNOSIS — E119 Type 2 diabetes mellitus without complications: Secondary | ICD-10-CM | POA: Diagnosis not present

## 2021-12-25 DIAGNOSIS — G629 Polyneuropathy, unspecified: Secondary | ICD-10-CM | POA: Diagnosis not present

## 2021-12-25 DIAGNOSIS — R0789 Other chest pain: Secondary | ICD-10-CM | POA: Diagnosis not present

## 2021-12-31 DIAGNOSIS — I2510 Atherosclerotic heart disease of native coronary artery without angina pectoris: Secondary | ICD-10-CM | POA: Diagnosis not present

## 2021-12-31 DIAGNOSIS — G629 Polyneuropathy, unspecified: Secondary | ICD-10-CM | POA: Diagnosis not present

## 2021-12-31 DIAGNOSIS — E119 Type 2 diabetes mellitus without complications: Secondary | ICD-10-CM | POA: Diagnosis not present

## 2021-12-31 DIAGNOSIS — R531 Weakness: Secondary | ICD-10-CM | POA: Diagnosis not present

## 2021-12-31 DIAGNOSIS — K219 Gastro-esophageal reflux disease without esophagitis: Secondary | ICD-10-CM | POA: Diagnosis not present

## 2021-12-31 DIAGNOSIS — I4891 Unspecified atrial fibrillation: Secondary | ICD-10-CM | POA: Diagnosis not present

## 2021-12-31 DIAGNOSIS — I10 Essential (primary) hypertension: Secondary | ICD-10-CM | POA: Diagnosis not present

## 2021-12-31 DIAGNOSIS — I639 Cerebral infarction, unspecified: Secondary | ICD-10-CM | POA: Diagnosis not present

## 2021-12-31 NOTE — Telephone Encounter (Signed)
Scheduled

## 2022-01-22 NOTE — Addendum Note (Signed)
Encounter addended by: Jeannette How on: 01/22/2022 3:06 PM  Actions taken: Imaging Exam ended

## 2022-02-04 DIAGNOSIS — E119 Type 2 diabetes mellitus without complications: Secondary | ICD-10-CM | POA: Diagnosis not present

## 2022-02-04 DIAGNOSIS — Z79899 Other long term (current) drug therapy: Secondary | ICD-10-CM | POA: Diagnosis not present

## 2022-02-04 DIAGNOSIS — I10 Essential (primary) hypertension: Secondary | ICD-10-CM | POA: Diagnosis not present

## 2022-02-04 DIAGNOSIS — I639 Cerebral infarction, unspecified: Secondary | ICD-10-CM | POA: Diagnosis not present

## 2022-02-04 DIAGNOSIS — G629 Polyneuropathy, unspecified: Secondary | ICD-10-CM | POA: Diagnosis not present

## 2022-02-04 DIAGNOSIS — K219 Gastro-esophageal reflux disease without esophagitis: Secondary | ICD-10-CM | POA: Diagnosis not present

## 2022-02-04 DIAGNOSIS — I4891 Unspecified atrial fibrillation: Secondary | ICD-10-CM | POA: Diagnosis not present

## 2022-02-07 ENCOUNTER — Encounter: Payer: Self-pay | Admitting: Nurse Practitioner

## 2022-02-07 ENCOUNTER — Ambulatory Visit: Payer: No Typology Code available for payment source | Admitting: Nurse Practitioner

## 2022-02-14 DIAGNOSIS — M1711 Unilateral primary osteoarthritis, right knee: Secondary | ICD-10-CM | POA: Diagnosis not present

## 2022-02-14 DIAGNOSIS — R059 Cough, unspecified: Secondary | ICD-10-CM | POA: Diagnosis not present

## 2022-02-14 DIAGNOSIS — M47816 Spondylosis without myelopathy or radiculopathy, lumbar region: Secondary | ICD-10-CM | POA: Diagnosis not present

## 2022-02-14 DIAGNOSIS — M5137 Other intervertebral disc degeneration, lumbosacral region: Secondary | ICD-10-CM | POA: Diagnosis not present

## 2022-02-14 DIAGNOSIS — M1712 Unilateral primary osteoarthritis, left knee: Secondary | ICD-10-CM | POA: Diagnosis not present

## 2022-02-14 DIAGNOSIS — M5136 Other intervertebral disc degeneration, lumbar region: Secondary | ICD-10-CM | POA: Diagnosis not present

## 2022-04-29 DIAGNOSIS — H524 Presbyopia: Secondary | ICD-10-CM | POA: Diagnosis not present

## 2023-02-15 DEATH — deceased
